# Patient Record
Sex: Male | Born: 1945 | Race: White | Hispanic: No | Marital: Married | Smoking: Former smoker
Health system: Southern US, Community
[De-identification: ages and names within clinical notes are randomized; demographics above are authoritative.]

## PROBLEM LIST (undated history)

## (undated) DIAGNOSIS — I619 Nontraumatic intracerebral hemorrhage, unspecified: Secondary | ICD-10-CM

## (undated) DIAGNOSIS — K922 Gastrointestinal hemorrhage, unspecified: Secondary | ICD-10-CM

## (undated) DIAGNOSIS — I493 Ventricular premature depolarization: Secondary | ICD-10-CM

## (undated) DIAGNOSIS — I5042 Chronic combined systolic (congestive) and diastolic (congestive) heart failure: Secondary | ICD-10-CM

## (undated) DIAGNOSIS — I639 Cerebral infarction, unspecified: Secondary | ICD-10-CM

## (undated) DIAGNOSIS — M069 Rheumatoid arthritis, unspecified: Secondary | ICD-10-CM

## (undated) DIAGNOSIS — I951 Orthostatic hypotension: Secondary | ICD-10-CM

## (undated) DIAGNOSIS — I671 Cerebral aneurysm, nonruptured: Secondary | ICD-10-CM

## (undated) DIAGNOSIS — I2510 Atherosclerotic heart disease of native coronary artery without angina pectoris: Secondary | ICD-10-CM

## (undated) DIAGNOSIS — R001 Bradycardia, unspecified: Secondary | ICD-10-CM

## (undated) DIAGNOSIS — K589 Irritable bowel syndrome without diarrhea: Secondary | ICD-10-CM

## (undated) DIAGNOSIS — I351 Nonrheumatic aortic (valve) insufficiency: Secondary | ICD-10-CM

## (undated) DIAGNOSIS — R0789 Other chest pain: Secondary | ICD-10-CM

## (undated) DIAGNOSIS — I77810 Thoracic aortic ectasia: Secondary | ICD-10-CM

## (undated) DIAGNOSIS — I441 Atrioventricular block, second degree: Secondary | ICD-10-CM

## (undated) DIAGNOSIS — I5032 Chronic diastolic (congestive) heart failure: Secondary | ICD-10-CM

## (undated) DIAGNOSIS — F0280 Dementia in other diseases classified elsewhere without behavioral disturbance: Secondary | ICD-10-CM

## (undated) DIAGNOSIS — I495 Sick sinus syndrome: Secondary | ICD-10-CM

## (undated) DIAGNOSIS — I440 Atrioventricular block, first degree: Secondary | ICD-10-CM

## (undated) DIAGNOSIS — K5090 Crohn's disease, unspecified, without complications: Secondary | ICD-10-CM

## (undated) DIAGNOSIS — I340 Nonrheumatic mitral (valve) insufficiency: Secondary | ICD-10-CM

## (undated) DIAGNOSIS — I4729 Other ventricular tachycardia: Secondary | ICD-10-CM

## (undated) DIAGNOSIS — K5010 Crohn's disease of large intestine without complications: Secondary | ICD-10-CM

## (undated) DIAGNOSIS — R569 Unspecified convulsions: Secondary | ICD-10-CM

## (undated) DIAGNOSIS — K5790 Diverticulosis of intestine, part unspecified, without perforation or abscess without bleeding: Secondary | ICD-10-CM

## (undated) DIAGNOSIS — I10 Essential (primary) hypertension: Secondary | ICD-10-CM

## (undated) DIAGNOSIS — R7303 Prediabetes: Secondary | ICD-10-CM

## (undated) DIAGNOSIS — G4730 Sleep apnea, unspecified: Secondary | ICD-10-CM

## (undated) DIAGNOSIS — Z950 Presence of cardiac pacemaker: Secondary | ICD-10-CM

## (undated) DIAGNOSIS — I119 Hypertensive heart disease without heart failure: Secondary | ICD-10-CM

## (undated) DIAGNOSIS — S065XAA Traumatic subdural hemorrhage with loss of consciousness status unknown, initial encounter: Secondary | ICD-10-CM

## (undated) DIAGNOSIS — I472 Ventricular tachycardia: Secondary | ICD-10-CM

## (undated) DIAGNOSIS — E785 Hyperlipidemia, unspecified: Secondary | ICD-10-CM

## (undated) HISTORY — DX: Hypertensive heart disease without heart failure: I11.9

## (undated) HISTORY — DX: Presence of cardiac pacemaker: Z95.0

## (undated) HISTORY — PX: NOSE SURGERY: SHX723

## (undated) HISTORY — DX: Prediabetes: R73.03

## (undated) HISTORY — DX: Sleep apnea, unspecified: G47.30

## (undated) HISTORY — DX: Dementia in other diseases classified elsewhere, unspecified severity, without behavioral disturbance, psychotic disturbance, mood disturbance, and anxiety: F02.80

## (undated) HISTORY — DX: Atrioventricular block, first degree: I44.0

## (undated) HISTORY — DX: Traumatic subdural hemorrhage with loss of consciousness status unknown, initial encounter: S06.5XAA

## (undated) HISTORY — DX: Orthostatic hypotension: I95.1

## (undated) HISTORY — DX: Other ventricular tachycardia: I47.29

## (undated) HISTORY — DX: Bradycardia, unspecified: R00.1

## (undated) HISTORY — DX: Atrioventricular block, second degree: I44.1

## (undated) HISTORY — DX: Rheumatoid arthritis, unspecified: M06.9

## (undated) HISTORY — DX: Ventricular premature depolarization: I49.3

## (undated) HISTORY — DX: Atherosclerotic heart disease of native coronary artery without angina pectoris: I25.10

## (undated) HISTORY — DX: Ventricular tachycardia: I47.2

## (undated) HISTORY — DX: Unspecified convulsions: R56.9

## (undated) HISTORY — PX: TONSILLECTOMY AND ADENOIDECTOMY: SUR1326

## (undated) HISTORY — DX: Diverticulosis of intestine, part unspecified, without perforation or abscess without bleeding: K57.90

## (undated) HISTORY — PX: HERNIA REPAIR: SHX51

## (undated) HISTORY — DX: Thoracic aortic ectasia: I77.810

## (undated) HISTORY — DX: Cerebral infarction, unspecified: I63.9

## (undated) HISTORY — DX: Crohn's disease, unspecified, without complications: K50.90

## (undated) HISTORY — DX: Nonrheumatic aortic (valve) insufficiency: I35.1

## (undated) HISTORY — DX: Hyperlipidemia, unspecified: E78.5

## (undated) HISTORY — DX: Sick sinus syndrome: I49.5

## (undated) HISTORY — DX: Crohn's disease of large intestine without complications: K50.10

## (undated) HISTORY — DX: Essential (primary) hypertension: I10

## (undated) HISTORY — DX: Other chest pain: R07.89

## (undated) HISTORY — DX: Chronic diastolic (congestive) heart failure: I50.32

## (undated) HISTORY — DX: Chronic combined systolic (congestive) and diastolic (congestive) heart failure: I50.42

## (undated) HISTORY — DX: Nontraumatic intracerebral hemorrhage, unspecified: I61.9

## (undated) HISTORY — DX: Cerebral aneurysm, nonruptured: I67.1

## (undated) HISTORY — DX: Gastrointestinal hemorrhage, unspecified: K92.2

## (undated) HISTORY — DX: Nonrheumatic mitral (valve) insufficiency: I34.0

## (undated) HISTORY — DX: Irritable bowel syndrome, unspecified: K58.9

---

## 1976-07-04 HISTORY — PX: COLONOSCOPY W/ POLYPECTOMY: SHX1380

## 1995-07-05 HISTORY — PX: SHOULDER SURGERY: SHX246

## 1998-03-26 ENCOUNTER — Encounter: Payer: Self-pay | Admitting: Internal Medicine

## 1999-07-05 DIAGNOSIS — H269 Unspecified cataract: Secondary | ICD-10-CM | POA: Insufficient documentation

## 1999-07-05 DIAGNOSIS — J309 Allergic rhinitis, unspecified: Secondary | ICD-10-CM | POA: Insufficient documentation

## 1999-07-13 ENCOUNTER — Encounter: Payer: Self-pay | Admitting: Internal Medicine

## 2000-03-23 ENCOUNTER — Encounter: Payer: Self-pay | Admitting: Internal Medicine

## 2000-04-08 ENCOUNTER — Encounter: Payer: Self-pay | Admitting: Internal Medicine

## 2001-08-13 ENCOUNTER — Encounter: Payer: Self-pay | Admitting: Internal Medicine

## 2001-10-11 ENCOUNTER — Encounter: Payer: Self-pay | Admitting: Internal Medicine

## 2002-03-17 ENCOUNTER — Encounter: Payer: Self-pay | Admitting: Internal Medicine

## 2002-10-31 ENCOUNTER — Encounter: Payer: Self-pay | Admitting: Internal Medicine

## 2003-05-01 ENCOUNTER — Encounter: Payer: Self-pay | Admitting: Internal Medicine

## 2003-10-30 ENCOUNTER — Encounter: Payer: Self-pay | Admitting: Internal Medicine

## 2004-06-22 ENCOUNTER — Ambulatory Visit: Payer: Self-pay | Admitting: Internal Medicine

## 2004-07-09 ENCOUNTER — Ambulatory Visit: Payer: Self-pay | Admitting: Internal Medicine

## 2004-07-12 ENCOUNTER — Encounter: Admission: RE | Admit: 2004-07-12 | Discharge: 2004-07-12 | Payer: Self-pay | Admitting: Internal Medicine

## 2008-02-25 ENCOUNTER — Ambulatory Visit: Payer: Self-pay | Admitting: Internal Medicine

## 2008-03-06 ENCOUNTER — Ambulatory Visit: Payer: Self-pay | Admitting: Internal Medicine

## 2008-03-06 ENCOUNTER — Encounter: Payer: Self-pay | Admitting: Internal Medicine

## 2008-03-08 ENCOUNTER — Encounter: Payer: Self-pay | Admitting: Internal Medicine

## 2008-04-17 ENCOUNTER — Ambulatory Visit: Payer: Self-pay | Admitting: Internal Medicine

## 2008-04-17 DIAGNOSIS — Z8719 Personal history of other diseases of the digestive system: Secondary | ICD-10-CM | POA: Insufficient documentation

## 2008-04-17 DIAGNOSIS — K5010 Crohn's disease of large intestine without complications: Secondary | ICD-10-CM | POA: Insufficient documentation

## 2008-04-18 ENCOUNTER — Encounter: Payer: Self-pay | Admitting: Internal Medicine

## 2008-11-26 ENCOUNTER — Encounter: Payer: Self-pay | Admitting: Internal Medicine

## 2009-02-03 ENCOUNTER — Ambulatory Visit: Payer: Self-pay | Admitting: Internal Medicine

## 2009-02-03 DIAGNOSIS — M1990 Unspecified osteoarthritis, unspecified site: Secondary | ICD-10-CM | POA: Insufficient documentation

## 2009-02-03 DIAGNOSIS — I440 Atrioventricular block, first degree: Secondary | ICD-10-CM | POA: Insufficient documentation

## 2009-02-03 DIAGNOSIS — I10 Essential (primary) hypertension: Secondary | ICD-10-CM | POA: Insufficient documentation

## 2009-02-03 DIAGNOSIS — E785 Hyperlipidemia, unspecified: Secondary | ICD-10-CM | POA: Insufficient documentation

## 2009-02-04 ENCOUNTER — Encounter: Payer: Self-pay | Admitting: Internal Medicine

## 2010-05-07 ENCOUNTER — Encounter: Payer: Self-pay | Admitting: Internal Medicine

## 2010-08-03 NOTE — Procedures (Signed)
Summary: Primary Care  Primary Care   Imported By: Awilda Bill Mescal 04/18/2008 09:17:27  _____________________________________________________________________  External Attachment:    Type:   Image     Comment:   External Document

## 2010-08-03 NOTE — Procedures (Signed)
Summary: Gastroenterology-Consult 12/13/07  Gastroenterology-Consult   Imported By: Awilda Bill CMA 04/18/2008 08:31:46  _____________________________________________________________________  External Attachment:    Type:   Image     Comment:   External Document

## 2010-08-03 NOTE — Procedures (Signed)
Summary: Gastroenterology-Consult  Gastroenterology-Consult   Imported By: Bernie 04/18/2008 08:55:06  _____________________________________________________________________  External Attachment:    Type:   Image     Comment:   External Document

## 2010-08-03 NOTE — Letter (Signed)
Summary: Patient Notice- Colon Biospy Results  [facility]  [address] [place], [place] [address]   Phone: [phone]  Fax: [phone]        March 08, 2008 MRN: [id number]    Jeff Weaver [address] [place], [place]  [address]    Dear Mr. [name],  I am pleased to inform you that the biopsies taken during your recent colonoscopy did not show any evidence of cancer upon pathologic examination.The biopsies show normal colon tissue, no evidence of Crohn's disease.  Additional information/recommendations:  _  x__Please call [phone] to schedule a return visit to review      your condition.  _x_Continue with the treatment plan as outlined on the day of your      exam.  x__You should have a repeat colonoscopy examination for this problem           in 5_ years.  Please call us if you are having persistent problems or have questions about your condition that have not been fully answered at this time.  Sincerely,  Lafayette Dragon MD  This letter has been electronically signed by your physician.

## 2010-08-03 NOTE — Procedures (Signed)
Summary: Gastroenterology-Consult 01/02/2008  Gastroenterology-Consult   Imported By: Awilda Bill CMA 04/18/2008 08:40:02  _____________________________________________________________________  External Attachment:    Type:   Image     Comment:   External Document

## 2010-08-03 NOTE — Procedures (Signed)
Summary: Gastroenterology-Consult 02/18/1999  Gastroenterology-Consult   Imported By: Awilda Bill CMA 04/18/2008 09:19:16  _____________________________________________________________________  External Attachment:    Type:   Image     Comment:   External Document

## 2010-08-03 NOTE — Procedures (Signed)
Summary: Gastroenterology-Surgical Consult  Gastroenterology-Surgical Consult   Imported By: Awilda Bill Talmage 04/18/2008 09:11:57  _____________________________________________________________________  External Attachment:    Type:   Image     Comment:   External Document

## 2010-08-03 NOTE — Procedures (Signed)
Summary: Gastroenterology-consult  Gastroenterology-consult   Imported By: Awilda Bill CMA 04/18/2008 08:53:34  _____________________________________________________________________  External Attachment:    Type:   Image     Comment:   External Document

## 2010-08-03 NOTE — Procedures (Signed)
Summary: Gastroenterology- GI Surgical Consult  Gastroenterology- GI Surgical Consult   Imported By: Awilda Bill Verdel 04/18/2008 08:29:20  _____________________________________________________________________  External Attachment:    Type:   Image     Comment:   External Document

## 2010-08-03 NOTE — Procedures (Signed)
Summary: Gastroenterology-Consult  Gastroenterology-Consult   Imported By: Awilda Bill CMA 04/18/2008 08:51:59  _____________________________________________________________________  External Attachment:    Type:   Image     Comment:   External Document

## 2010-08-03 NOTE — Miscellaneous (Signed)
Summary: [facility]/PV/prep  Clinical Lists Changes  Medications: Added new medication of MOVIPREP 100 GM  SOLR (PEG-KCL-NACL-NASULF-NA ASC-C) As per prep instructions. - Signed Rx of MOVIPREP 100 GM  SOLR (PEG-KCL-NACL-NASULF-NA ASC-C) As per prep instructions.;  #1 x 0;  Signed;  Entered by: Ulice Dash RN;  Authorized by: Lafayette Dragon MD;  Method used: Electronically to [facility] [place], [place], [place]  [address], Ph: [phone], Fax: [phone] Observations: Added new observation of NKA: T (02/25/2008 9:56)    Prescriptions: MOVIPREP 100 GM  SOLR (PEG-KCL-NACL-NASULF-NA ASC-C) As per prep instructions.  #1 x 0   Entered by:   Ulice Dash RN   Authorized by:   Lafayette Dragon MD   Signed by:   Ulice Dash RN on 02/25/2008   Method used:   Electronically to        [facility] (retail)       [place], [place]  [address]       Ph: [phone]       Fax: [phone]   RxID:   [id number]

## 2010-08-03 NOTE — Procedures (Signed)
Summary: Gastroenterology-Colon 01/16/2004  Gastroenterology-Colon   Imported By: Awilda Bill CMA 04/18/2008 08:41:07  _____________________________________________________________________  External Attachment:    Type:   Image     Comment:   External Document

## 2010-08-03 NOTE — Miscellaneous (Signed)
Summary: canasa supp  Clinical Lists Changes  Medications: Added new medication of CANASA 1000 MG  SUPP (MESALAMINE) 1 hs - Signed Rx of CANASA 1000 MG  SUPP (MESALAMINE) 1 hs;  #28 x 1;  Signed;  Entered by: Laverna Peace RN;  Authorized by: Lafayette Dragon MD;  Method used: Electronically to [facility], [place], [place]  [address], Ph: [phone], Fax: [phone]    Prescriptions: CANASA 1000 MG  SUPP (MESALAMINE) 1 hs  #28 x 1   Entered by:   Laverna Peace RN   Authorized by:   Lafayette Dragon MD   Signed by:   Laverna Peace RN on 03/06/2008   Method used:   Electronically to        [facility] (retail)       [place], [place]  [address]       Ph: [phone]       Fax: [phone]   RxID:   [id number]

## 2010-08-03 NOTE — Procedures (Signed)
Summary: Gastroenterology-Consult  Gastroenterology-Consult   Imported By: Awilda Bill CMA 04/18/2008 08:56:37  _____________________________________________________________________  External Attachment:    Type:   Image     Comment:   External Document

## 2010-08-03 NOTE — Assessment & Plan Note (Signed)
Summary: NEW PT/NS/KDC   Vital Signs:  Patient profile:   65 year old male Height:      69.75 inches Weight:      218.6 pounds BMI:     31.71 Temp:     98.5 degrees F oral Pulse rate:   60 / minute Resp:     16 per minute BP sitting:   106 / 62  (left arm) Cuff size:   large  Vitals Entered By: Georgette Dover (February 03, 2009 1:45 PM)  Primary Care Provider:  [facility]   History of Present Illness: Jeff Weaver is here to establish; he has been followed @ [facility]. He is asymptomatic. BP is 140/72-80 or <. Labs : LDL 79, HDL 39 ([facility]26[facility]  Hypertension History:      He denies headache, chest pain, palpitations, dyspnea with exertion, orthopnea, PND, peripheral edema, visual symptoms, neurologic problems, syncope, and side effects from treatment.  He notes no problems with any antihypertensive medication side effects.        Positive major cardiovascular risk factors include male age 12 years old or older, hyperlipidemia, hypertension, and current tobacco user.     Preventive Screening-Counseling & Management  Caffeine-Diet-Exercise     Does Patient Exercise: no  Allergies (verified): No Known Drug Allergies  Past History:  Past Medical History: Arthritis(DJD) Colon Polyps Crohns Disease with PMH of GI Bleed from Inflammatory Bowel Disease Hyperlipidemia Hypertension Irritable Bowel Syndrome  Past Surgical History: Hernia Surgery Rotator Cuff Repair RUE Sinus Surgery  Tonsillectomy Vasectomy  Family History: No FH of Colon Cancer: Family History of Diabetes: sister Family History of Liver Disease/Cirrhosis:Mother Father : COPD, CAD  Social History: Married Patient currently smokes. 6-10 bowls per day Retired Alcohol use-yes: occa Regular exercise-no Does Patient Exercise:  no  Review of Systems GU:  PSA was WNL @ [place]. MS:  Complains of joint pain and low back pain; denies joint redness, joint swelling, mid back pain, and thoracic pain; Clicking & occa  locking R shoulder with pain.  Physical Exam  General:  well-nourished,in no acute distress; alert,appropriate and cooperative throughout examination Head:  Normocephalic and atraumatic without obvious abnormalities. Neck:  No deformities, masses, or tenderness noted. Lungs:  Normal respiratory effort, chest expands symmetrically. Lungs are clear to auscultation, no crackles or wheezes. Heart:  Normal rate and regular rhythm. S1 and S2 normal without gallop, murmur, click, rub. S4 Abdomen:  Bowel sounds positive,abdomen soft and non-tender without masses, organomegaly or hernias noted. Pulses:  R and L carotid,radial,dorsalis pedis and posterior tibial pulses are full and equal bilaterally Extremities:  No clubbing, cyanosis, edema. Crepitus R knee > L Neurologic:  alert & oriented X3 and DTRs symmetrical and normal.   Skin:  Intact without suspicious lesions or rashes Cervical Nodes:  No lymphadenopathy noted Axillary Nodes:  No palpable lymphadenopathy Psych:  memory intact for recent and remote, normally interactive, and good eye contact.     Impression & Recommendations:  Problem # 1:  HYPERTENSION, ESSENTIAL NOS (ICD-401.9)  Controlled His updated medication list for this problem includes:    Lisinopril-hydrochlorothiazide 20-25 Mg Tabs (Lisinopril-hydrochlorothiazide) .Marland Kitchen... 1/2 tablet once daily  Orders: EKG w/ Interpretation (93000)  Problem # 2:  OTHER AND UNSPECIFIED HYPERLIPIDEMIA (ICD-272.4) Controlled His updated medication list for this problem includes:    Crestor 10 Mg Tabs (Rosuvastatin calcium) .Marland Kitchen... 1/2 tablet at bedtime  Problem # 3:  OSTEOARTHRITIS (ICD-715.90)  His updated medication list for this problem includes:  Aspirin 81 Mg Tabs (Aspirin) .Marland Kitchen... 1 tablet once daily    Ibuprofen 200 Mg Tabs (Ibuprofen) .Marland Kitchen... 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 Insert 1 suppository into rectum at bedtime 2)  Pentasa 250 Mg Cr-caps (Mesalamine) .... 2 by mouth two times a day 3)  Multivitamins Tabs (Multiple vitamin) .Marland Kitchen.. 1 tablet once daily 4)  Aspirin 81 Mg Tabs (Aspirin) .Marland Kitchen.. 1 tablet once daily 5)  Crestor 10 Mg Tabs (Rosuvastatin calcium) .... 1/2 tablet at bedtime 6)  Lisinopril-hydrochlorothiazide 20-25 Mg Tabs (Lisinopril-hydrochlorothiazide) .... 1/2 tablet once daily 7)  Flunisolide 0.025 % Soln (Flunisolide) .... As needed 8)  Ibuprofen 200 Mg Tabs (Ibuprofen) .... As needed  Hypertension Assessment/Plan:      The patient's hypertensive risk group is category B: At least one risk factor (excluding diabetes) with no target organ damage.  Today's blood pressure is 106/62.    Patient Instructions: 1)  Check your Blood Pressure regularly. Your goal = AVERAGE < 135/85.

## 2010-08-03 NOTE — Procedures (Signed)
Summary: Gastroenterology-Consult  Gastroenterology-Consult   Imported By: [place] 04/18/2008 09:21:53  _____________________________________________________________________  External Attachment:    Type:   Image     Comment:   External Document

## 2010-08-03 NOTE — Procedures (Signed)
Summary: Gastroenterology  Gastroenterology   Imported By: Awilda Bill CMA 04/18/2008 08:50:47  _____________________________________________________________________  External Attachment:    Type:   Image     Comment:   External Document

## 2010-08-03 NOTE — Procedures (Signed)
[facility]  [facility]   Imported By: Awilda Bill [place] 04/18/2008 08:25:25  _____________________________________________________________________  External Attachment:    Type:   Image     Comment:   External Document

## 2010-08-03 NOTE — Miscellaneous (Signed)
[facility]   Imported By: Edmonia James 05/20/2010 10:54:20  _____________________________________________________________________  External Attachment:    Type:   Image     Comment:   External Document

## 2010-08-03 NOTE — Assessment & Plan Note (Signed)
Summary: hx of crohns/mws   History of Present Illness Visit Type: self referral Primary GI MD: [name] MD Primary Provider: [facility] Chief Complaint: f/u colonoscopy 03-06-08 History of Present Illness:   This is a 65 year old white male with Crohn's disease since 1978 transferring care from the [facility] to [place]. A colonoscopy in 2009 showed mild diverticulosis of the left colon and bleeding from the anal canal without any definite hemorrhoids or fissures. He has been essentially asymptomatic on Canasa suppositories 1000 mg a day and Pentasa 500 mg twice a day. He has regular bowel habits and no abdominal pain. His disease has been rather mild, his last flareup occurred in June 2009 and responded to a prednisone taper. The flareup prior to that was about 5 years before that and again responded to steroids. We have received his [facility] records and I have reviewed them. The patient has had excellent medical care.   GI Review of Systems      Denies abdominal pain, acid reflux, belching, bloating, chest pain, dysphagia with liquids, dysphagia with solids, heartburn, loss of appetite, nausea, vomiting, vomiting blood, weight loss, and  weight gain.        Denies anal fissure, black tarry stools, change in bowel habit, constipation, diarrhea, diverticulosis, fecal incontinence, heme positive stool, hemorrhoids, irritable bowel syndrome, jaundice, light color stool, liver problems, rectal bleeding, and  rectal pain.     Prior Medications Reviewed Using: Patient Recall  Updated Prior Medication List: CANASA 1000 MG  SUPP (MESALAMINE) 1 hs PENTASA 250 MG CR-CAPS (MESALAMINE) 2 by mouth two times a day MULTIVITAMINS   TABS (MULTIPLE VITAMIN) 1 tablet once daily ASPIRIN 81 MG  TABS (ASPIRIN) 1 tablet once daily CRESTOR 10 MG TABS (ROSUVASTATIN CALCIUM) 1/2 tablet at bedtime LISINOPRIL-HYDROCHLOROTHIAZIDE 20-25 MG TABS (LISINOPRIL-HYDROCHLOROTHIAZIDE) 1/2 tablet once daily  Current Allergies  (reviewed today): No known allergies   Past Medical History:    Reviewed history and no changes required:       Arthritis       Colon Polyps       Crohns Disease       GI Bleed       Hyperlipidemia       Hypertension       Irritable Bowel Syndrome  Past Surgical History:    Hernia Surgery    Rotator Cuff Repair    Sinus Surgery   Family History:    Reviewed history and no changes required:       No FH of Colon Cancer:       Family History of Diabetes: sister       Family History of Liver Disease/Cirrhosis:Mother, Father  Social History:    Reviewed history and no changes required:       Married       Patient currently smokes. 6-10 pipes per day       Illicit Drug Use - no   Risk Factors:  Tobacco use:  current Drug use:  no   Review of Systems       The patient complains of arthritis/joint pain, back pain, fatigue, muscle pains/cramps, and sleeping problems.     Vital Signs:  Patient Profile:   65 Years Old Male Height:     70 inches Weight:      219.25 pounds BMI:     31.57 BSA:     2.17 Pulse rate:   78 / minute Pulse rhythm:   regular BP sitting:   104 / 64  (  right arm)  Vitals Entered By: Bethel Born CMA (April 17, 2008 10:10 AM)                  Physical Exam  General:     Well developed, well nourished, no acute distress. Neck:     Supple; no masses or thyromegaly. Lungs:     Clear throughout to auscultation. Heart:     Regular rate and rhythm; no murmurs, rubs,  or bruits. Abdomen:     soft abdomen, nontender, normoactive bowel sounds, liver edge at costal margin, Rectal:     normal rectal tone no evidence of perianal disease, no tenderness, stool is Hemoccult negative Extremities:     No clubbing, cyanosis, edema or deformities noted.    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 Currently in remission.  Status post recent colonoscopy in September 2009 with no evidence of microscopic or endoscopically  detectable l disease. The patient is on Pentasa and Canasa suppositories. I have asked him to taper his Canasa suppositories to a maintenance dose of 2-3 times a week. He needs to continue on his Pentasa 500 mg twice a day. We will see him again n one year.   Patient Instructions: 1)  Pentasa 250 mg 2 p.o. b.i.d. 2)  Multiple vitamin one p.o. q.d. 3)  Canasa suppositories 1000 mg 3 times a week 4)  Office visit in one year 5)   pt is going to establish with a PCP in our office    Prescriptions: CANASA 1000 MG  SUPP (MESALAMINE) Insert 1 suppository into rectum at bedtime  #90 x 3   Entered by:   Awilda Bill CMA   Authorized by:   Lafayette Dragon MD   Signed by:   Awilda Bill CMA on 04/17/2008   Method used:   Print then Give to Patient   RxID:   [id number]  ]

## 2010-08-03 NOTE — Procedures (Signed)
Summary: Gastroenterology-surgical report 05/01/2000  Gastroenterology-surgical report   Imported By: Awilda Bill CMA 04/18/2008 08:43:15  _____________________________________________________________________  External Attachment:    Type:   Image     Comment:   External Document

## 2010-08-03 NOTE — Procedures (Signed)
Summary: Gastroenterology-Consult  Gastroenterology-Consult   Imported By: Awilda Bill CMA 04/18/2008 09:07:19  _____________________________________________________________________  External Attachment:    Type:   Image     Comment:   External Document

## 2010-08-03 NOTE — Procedures (Signed)
Summary: Colonoscopy   Colonoscopy  Procedure date:  03/06/2008  Findings:      Location:  [place].    Procedures Next Due Date:    Colonoscopy: 03/2013  Patient Name: Jeff Weaver, Jeff Weaver MRN:  Procedure Procedures: Colonoscopy CPT: 65681.    with biopsy. CPT: X8550940.  Personnel: Endoscopist: Dora L. Olevia Perches, MD.  Exam Location: Exam performed in Outpatient Clinic. Outpatient  Patient Consent: Procedure, Alternatives, Risks and Benefits discussed, consent obtained, from patient. Consent was obtained by the RN.  Indications Symptoms: Hematochezia.  Surveillance of: Crohn's Disease. 2005.  Comments: no records available, pt is a direct colon, he has been followed at [facility] for Crohn's disease  ( 1973 Dx) History  Current Medications: Patient is not currently taking Coumadin.  Pre-Exam Physical: Performed Mar 06, 2008. Entire physical exam was normal.  Comments: Pt. history reviewed/updated, physical exam performed prior to initiation of sedation?yes Exam Exam: Extent of exam reached: Cecum, extent intended: Cecum.  The cecum was identified by appendiceal orifice and IC valve. Duration of exam: time in 4:34 min, out 6:58 min minutes. Colon retroflexion performed. Images taken. ASA Classification: II. Tolerance: good.  Monitoring: Pulse and BP monitoring, Oximetry used. Supplemental O2 given.  Colon Prep Used Moviprep for colon prep. Prep results: good.  Sedation Meds: Patient assessed and found to be appropriate for moderate (conscious) sedation. Fentanyl 75 mcg. given IV. Versed 7 mg. given IV.  Findings - DIAGNOSTIC TEST: Biopsies taken. from Ascending Colon to Descending Colon. Reason: r/o granulomas, .  - NORMAL EXAM: Cecum.  - DIVERTICULOSIS: Sigmoid Colon. ICD9: Diverticulosis: 562.10. Comments: minimal sigmoid diverticulosis.  - NORMAL EXAM: Rectum. Comments: no internal hems, anal canal with bleeding around the canal, no fissure,no  external disease.   Assessment Abnormal examination, see findings above.  Diagnoses: 562.10: Diverticulosis.   Comments: no evidence of active Crohn's disease, small amount of bleeding from the anal canal, no definite rectal involvement  Events  Unplanned Interventions: No intervention was required.  Unplanned Events: There were no complications. Plans Medication Plan: Await pathology. Canasa supp 102m: 1 HS, starting Mar 06, 2008   Patient Education: Patient given standard instructions for: Patient instructed to get routine colonoscopy every 5 years.  Comments: stop Cort-enemas, reduce Mesalamine to 250 mg, 2 po bid, [facility]  Disposition: After procedure patient sent to recovery. After recovery patient sent home.  Scheduling/Referral: [place], [place] [facility], first available,    cc:  BEdwin Dada MD   REPORT OF SURGICAL PATHOLOGY   Case #: [id number] Name: [name], KEANOffice Chart Number:  N/A   MRN: [id number]: JLennox Solders KLyndon Code MD DOB/Age  [birth year](Age: [age]    Gender: M Date Taken:  03/06/2008 Date Received: 03/06/2008   FINAL DIAGNOSIS   ***MICROSCOPIC EXAMINATION AND DIAGNOSIS***   1.  COLON, RANDOM RIGHT, BIOPSIES:   - BENIGN COLONIC MUCOSA.  NO SIGNIFICANT INFLAMMATION OR OTHER ABNORMALITIES  IDENTIFIED.    2.  COLON, SIGMOID, BIOPSIES:  - BENIGN COLONIC MUCOSA.  NO SIGNIFICANT INFLAMMATION OR OTHER ABNORMALITIES  IDENTIFIED.    mj Date Reported:  03/07/2008     JLennox Solders KLyndon Code MD *** Electronically Signed Out By JBK ***   Clinical information 1,2.  R/O Crohn' s (jes)    specimen(s) obtained 1: Colon, biopsy, random right 2: Colon, biopsy, sigmoid   Gross Description 1.  Received in formalin are  tan, soft tissue fragments that are submitted in toto.  Number:  six  Size: 0.1 to 0.7 cm  One block   2.  Received in formalin are tan, soft tissue fragments that are submitted in toto.    Number:  four. Size:  0.2 to 0.8 cm One block    (TA:jes,03/07/08)    jes/     Signed by Lafayette Dragon MD on 03/08/2008 at 10:41 PM  ________________________________________________________________________ recall colon 5 years   Signed by Lafayette Dragon MD on 03/08/2008 at 10:41 PM  ________________________________________________________________________    March 08, 2008 MRN: [id number]    [name] [address], [place]  [address]    Dear Jeff Weaver,  I am pleased to inform you that the biopsies taken during your recent colonoscopy did not show any evidence of cancer upon pathologic examination.The biopsies show normal colon tissue, no evidence of Crohn's disease.  Additional information/recommendations:  _  x__Please call [phone] to schedule a return visit to review      your condition.  _x_Continue with the treatment plan as outlined on the day of your      exam.  x__You should have a repeat colonoscopy examination for this problem           in 5_ years.  Please call us if you are having persistent problems or have questions about your condition that have not been fully answered at this time.  Sincerely,  Lafayette Dragon MD  This letter has been electronically signed by your physician.   Signed by Lafayette Dragon MD on 03/08/2008 at 10:43 PM  ________________________________________________________________________ This report was created from the original endoscopy report, which was reviewed and signed by the above listed endoscopist.

## 2010-08-03 NOTE — Procedures (Signed)
Summary: Gastroenterology-proceedure report  Gastroenterology-proceedure report   Imported By: Awilda Bill CMA 04/18/2008 09:02:50  _____________________________________________________________________  External Attachment:    Type:   Image     Comment:   External Document

## 2010-09-28 ENCOUNTER — Ambulatory Visit: Payer: Medicare Other | Attending: Orthopedic Surgery | Admitting: Physical Therapy

## 2010-09-28 DIAGNOSIS — IMO0001 Reserved for inherently not codable concepts without codable children: Secondary | ICD-10-CM | POA: Insufficient documentation

## 2010-09-28 DIAGNOSIS — M25579 Pain in unspecified ankle and joints of unspecified foot: Secondary | ICD-10-CM | POA: Insufficient documentation

## 2010-10-01 ENCOUNTER — Ambulatory Visit: Payer: Medicare Other | Admitting: Physical Therapy

## 2010-10-04 ENCOUNTER — Ambulatory Visit: Payer: Medicare Other | Attending: Orthopedic Surgery | Admitting: Physical Therapy

## 2010-10-04 DIAGNOSIS — M25579 Pain in unspecified ankle and joints of unspecified foot: Secondary | ICD-10-CM | POA: Insufficient documentation

## 2010-10-04 DIAGNOSIS — IMO0001 Reserved for inherently not codable concepts without codable children: Secondary | ICD-10-CM | POA: Insufficient documentation

## 2010-10-06 ENCOUNTER — Ambulatory Visit: Payer: Medicare Other | Admitting: Physical Therapy

## 2010-10-11 ENCOUNTER — Ambulatory Visit: Payer: Medicare Other | Admitting: Rehabilitation

## 2010-10-15 ENCOUNTER — Ambulatory Visit: Payer: Medicare Other | Admitting: Physical Therapy

## 2010-10-18 ENCOUNTER — Ambulatory Visit: Payer: Medicare Other | Admitting: Rehabilitation

## 2010-10-22 ENCOUNTER — Ambulatory Visit: Payer: Medicare Other | Admitting: Physical Therapy

## 2010-10-25 ENCOUNTER — Ambulatory Visit: Payer: Medicare Other | Admitting: Rehabilitation

## 2010-10-29 ENCOUNTER — Ambulatory Visit: Payer: Medicare Other | Admitting: Physical Therapy

## 2010-11-01 ENCOUNTER — Ambulatory Visit: Payer: Medicare Other | Admitting: Physical Therapy

## 2010-11-03 ENCOUNTER — Ambulatory Visit: Payer: Medicare Other | Attending: Orthopedic Surgery | Admitting: Physical Therapy

## 2010-11-03 DIAGNOSIS — IMO0001 Reserved for inherently not codable concepts without codable children: Secondary | ICD-10-CM | POA: Insufficient documentation

## 2010-11-03 DIAGNOSIS — M25579 Pain in unspecified ankle and joints of unspecified foot: Secondary | ICD-10-CM | POA: Insufficient documentation

## 2010-11-09 ENCOUNTER — Ambulatory Visit: Payer: Medicare Other | Admitting: Physical Therapy

## 2010-11-11 ENCOUNTER — Ambulatory Visit: Payer: Medicare Other | Admitting: Physical Therapy

## 2010-11-16 ENCOUNTER — Ambulatory Visit: Payer: Medicare Other | Admitting: Physical Therapy

## 2010-11-18 ENCOUNTER — Ambulatory Visit: Payer: Medicare Other | Admitting: Physical Therapy

## 2010-11-19 ENCOUNTER — Encounter: Payer: Medicare Other | Admitting: Physical Therapy

## 2010-11-23 ENCOUNTER — Ambulatory Visit: Payer: Medicare Other | Admitting: Physical Therapy

## 2010-11-24 ENCOUNTER — Encounter: Payer: Medicare Other | Admitting: Physical Therapy

## 2010-11-30 ENCOUNTER — Encounter: Payer: Medicare Other | Admitting: Physical Therapy

## 2010-12-03 ENCOUNTER — Encounter: Payer: Medicare Other | Admitting: Physical Therapy

## 2011-07-10 DIAGNOSIS — J019 Acute sinusitis, unspecified: Secondary | ICD-10-CM | POA: Diagnosis not present

## 2011-07-25 ENCOUNTER — Encounter: Payer: Self-pay | Admitting: Internal Medicine

## 2011-07-25 ENCOUNTER — Ambulatory Visit (INDEPENDENT_AMBULATORY_CARE_PROVIDER_SITE_OTHER): Payer: Medicare Other | Admitting: Internal Medicine

## 2011-07-25 VITALS — BP 126/88 | HR 56 | Temp 98.7°F | Resp 12 | Ht 70.0 in | Wt 233.2 lb

## 2011-07-25 DIAGNOSIS — Z01818 Encounter for other preprocedural examination: Secondary | ICD-10-CM

## 2011-07-25 DIAGNOSIS — Z23 Encounter for immunization: Secondary | ICD-10-CM | POA: Diagnosis not present

## 2011-07-25 NOTE — Progress Notes (Signed)
Subjective:    Patient ID: Jeff Weaver, male    DOB: 1945-10-17, 66 y.o.   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 Lipids will be repeated at the end of this month at the [place]. Labs included an A1c of 5.7, creatinine 0.9 and potassium 3.9. Thyroid function tests were also normal.  HYPERTENSION: Disease Monitoring  Blood pressure range: 127-132/70-88  Chest pain: no exertional cp   Dyspnea: no   Claudication: no  Medication compliance: yes,   Medication Side Effects  Lightheadedness: no   Urinary frequency: no , but nocturia up to  3-4   Edema: no   Preventitive Healthcare:  Exercise: no   Diet Pattern: no plan  Salt Restriction: yes   He has a history of regional enteritis. He denies abdominal pain, rectal bleeding, melena,or unexplained  weight loss.       Objective:   Physical Exam Gen.: Healthy and well-nourished in appearance. Alert, appropriate and cooperative throughout exam. Head: Normocephalic without obvious abnormalities;  Neck: No deformities, masses, or tenderness noted.  Thyroid normal. Lungs: Normal respiratory effort; chest expands symmetrically. Lungs are clear to auscultation without rales, wheezes, or increased work of breathing. Heart: Normal rate and rhythm. Normal S1 and S2. No gallop, click, or rub. S4 w/o murmur. Abdomen: Bowel sounds normal; abdomen soft and nontender. No masses, organomegaly or hernias  noted. Genitalia: DRE 12/12 @ [facility] Musculoskeletal/extremities: No deformity or scoliosis noted of  the thoracic or lumbar spine. No clubbing, cyanosis, edema, or deformity noted. Range of motion  normal .Tone & strength  normal.Joints normal. Nail health  good. Vascular: Carotid, radial artery, dorsalis pedis and  posterior tibial pulses are full and equal. No bruits present. Neurologic: Alert and oriented x3. Deep tendon reflexes symmetrical and normal.          Skin: Intact without suspicious lesions or rashes. Lymph: No cervical, axillary  lymphadenopathy present. Psych: Mood and affect are normal. 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 I would recommend a CK with those labs because of the history of leg pain on Crestor.  #3 hypertension, control appears to be adequate.  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 He is on Tenoretic at 50/25 daily. I asked  him to discuss other options to the Tenoretic because of the marked bradycardia(pulse 46).  The computer describes low voltage in the precordial leads; this is  due to body habitus. The computer and was and will also describes incomplete left bundle branch block. There appears to be a minor interventricular delay .

## 2011-07-25 NOTE — Patient Instructions (Addendum)
.  Share results with all medical care seen. Blood Pressure Goal  Ideally is an AVERAGE < 135/85. This AVERAGE should be calculated from @ least 5-7 BP readings taken @ different times of day on different days of week. You should not respond to isolated BP readings , but rather the AVERAGE for that week Risk of premature heart attack or stroke increases as LDL or BAD cholesterol rises.Advanced cholesterol panels optimally determine risk based on particle composition ( NMR Lipoprofile ) or by assessing multiple other genetic risks([facility] Heart Panel or [facility] Lipid Panel). These are indicated when LDL is > 130, especially if there is family history of heart attack in males before 5 or women before 54.  The best dietary  information on cholesterol is Dr Nunzio Cory book Eat, Drink & Be Healthy. Please provide results of your cholesterol panel when available. The results of your sleep study are critical prior to your being cleared medically for surgery.  08/09/2011:Sleep apnea studies done 03/21/11 at the [facility] were reviewed. Severe sleep apnea is present with potential cardiovascular risk and risk of daytime sleepiness. CPAP was to be titrated. A definitive statement is needed from the specialist treating his sleep apnea prior to clearance for the orthopedic surgery because of potential associated perioperative risks.

## 2011-08-09 ENCOUNTER — Encounter: Payer: Self-pay | Admitting: Internal Medicine

## 2011-08-09 DIAGNOSIS — G4733 Obstructive sleep apnea (adult) (pediatric): Secondary | ICD-10-CM | POA: Insufficient documentation

## 2011-08-09 NOTE — Progress Notes (Signed)
  Subjective:    Patient ID: Jeff Weaver, male    DOB: 1945-11-04, 67 y.o.   MRN: [id number]  HPI    Review of Systems     Objective:   Physical Exam        Assessment & Plan:  Results sleep study done 03/21/11 [facility] were reviewed. Severe obstructive sleep apnea was present; abdomen was worse when supine. Significant periodic leg movements of sleep disrupted sleep pattern. CPAP was to be titrated. This degree of sleep apnea. 2 attention attributes to daytime sleepiness and long-term cardiovascular morbidity. The  response to CPAP or other treatment in controlling the severe Sleep Apnea must be clarified prior to clearance for surgery by Dr. Veverly Fells.

## 2011-11-09 ENCOUNTER — Encounter: Payer: Self-pay | Admitting: Internal Medicine

## 2011-11-09 ENCOUNTER — Ambulatory Visit (INDEPENDENT_AMBULATORY_CARE_PROVIDER_SITE_OTHER): Payer: Medicare Other | Admitting: Internal Medicine

## 2011-11-09 VITALS — BP 120/78 | HR 54 | Temp 98.1°F | Wt 220.0 lb

## 2011-11-09 DIAGNOSIS — J019 Acute sinusitis, unspecified: Secondary | ICD-10-CM

## 2011-11-09 DIAGNOSIS — J209 Acute bronchitis, unspecified: Secondary | ICD-10-CM

## 2011-11-09 MED ORDER — AMOXICILLIN 500 MG PO CAPS: 500.0000 mg | ORAL_CAPSULE | Freq: Three times a day (TID) | ORAL | Status: AC

## 2011-11-09 MED ORDER — HYDROCODONE-HOMATROPINE 5-1.5 MG/5ML PO SYRP: 5.0000 mL | ORAL_SOLUTION | Freq: Four times a day (QID) | ORAL | Status: AC | PRN

## 2011-11-09 NOTE — Patient Instructions (Signed)
Plain Mucinex for thick secretions ;force NON dairy fluids . Use a Neti pot daily as needed for sinus congestion; going from open side to congested side . Nasal cleansing in the shower as discussed. Make sure that all residual soap is removed to prevent irritation. Nasonex 1 spray in each nostril twice a day as needed. Use the "crossover" technique as discussed.

## 2011-11-09 NOTE — Progress Notes (Signed)
  Subjective:    Patient ID: Jeff Weaver, male    DOB: Oct 15, 1945, 66 y.o.   MRN: [id number]  HPI Symptoms began approximately 10 days ago with head congestion followed by productive cough; over-the-counter allergy congestion decongestants and cough drops have not been of benefit. He did have sore throat approximately a ago  He has not had frontal headache, facial pain, earache or discharge, or dental pain. He does have purulent nasal secretions as well as a lesser amount of purulent sputum. There has been some shortness of breath but no frank wheezing. He has noted increased sweating with exertion  Significantly he has obstructive sleep apnea. He quit smoking in January 2011    Review of Systems he has noted some hesitancy in urination since he has been on the  decongestant     Objective:   Physical Exam General appearance:good health ;well nourished; no acute distress or increased work of breathing is present.  No  lymphadenopathy about the head, neck, or axilla noted.   Eyes: No conjunctival inflammation or lid edema is present.   Ears:  External ear exam shows no significant lesions or deformities.  Otoscopic examination reveals clear canals, tympanic membranes are intact bilaterally without bulging, retraction, inflammation or discharge.  Nose:  External nasal examination shows no deformity or inflammation. Nasal mucosa are pink and moist without lesions or exudates. No septal dislocation or deviation.No obstruction to airflow. Hyponasal speech pattern  Oral exam: Dental hygiene is good; lips and gums are healthy appearing.There is no oropharyngeal erythema or exudate noted.     Heart:  Normal rate and regular rhythm. S1 and S2 normal without gallop, murmur, click, rub . S 4  Lungs:Chest clear to auscultation; no wheezes, rhonchi,rales ,or rubs present.No increased work of breathing.    Extremities:  No cyanosis, edema, or clubbing  noted    Skin: Warm & dry             Assessment & Plan:  #1 sinusitis  #2 bronchitis with bronchospasm  Plan: See orders and recommendations

## 2011-11-23 ENCOUNTER — Encounter: Payer: Self-pay | Admitting: Internal Medicine

## 2011-11-23 ENCOUNTER — Ambulatory Visit (INDEPENDENT_AMBULATORY_CARE_PROVIDER_SITE_OTHER): Payer: Medicare Other | Admitting: Internal Medicine

## 2011-11-23 ENCOUNTER — Ambulatory Visit: Payer: Medicare Other | Admitting: Internal Medicine

## 2011-11-23 DIAGNOSIS — I671 Cerebral aneurysm, nonruptured: Secondary | ICD-10-CM

## 2011-11-23 DIAGNOSIS — R7309 Other abnormal glucose: Secondary | ICD-10-CM | POA: Diagnosis not present

## 2011-11-23 DIAGNOSIS — R413 Other amnesia: Secondary | ICD-10-CM | POA: Diagnosis not present

## 2011-11-23 DIAGNOSIS — E785 Hyperlipidemia, unspecified: Secondary | ICD-10-CM | POA: Diagnosis not present

## 2011-11-23 DIAGNOSIS — I10 Essential (primary) hypertension: Secondary | ICD-10-CM | POA: Diagnosis not present

## 2011-11-23 LAB — BASIC METABOLIC PANEL
BUN: 18 mg/dL (ref 6–23)
CO2: 33 mEq/L — ABNORMAL HIGH (ref 19–32)
Calcium: 9.5 mg/dL (ref 8.4–10.5)
Chloride: 99 mEq/L (ref 96–112)
Creatinine, Ser: 0.9 mg/dL (ref 0.4–1.5)
GFR: 95.41 mL/min (ref >60.00)
Glucose, Bld: 109 mg/dL — ABNORMAL HIGH (ref 70–99)
Potassium: 3.4 mEq/L — ABNORMAL LOW (ref 3.5–5.1)
Sodium: 142 mEq/L (ref 135–145)

## 2011-11-23 LAB — HEPATIC FUNCTION PANEL
ALT: 55 U/L — ABNORMAL HIGH (ref 0–53)
AST: 44 U/L — ABNORMAL HIGH (ref 0–37)
Albumin: 4 g/dL (ref 3.5–5.2)
Alkaline Phosphatase: 43 U/L (ref 39–117)
Bilirubin, Direct: 0.2 mg/dL (ref 0.0–0.3)
Total Bilirubin: 0.9 mg/dL (ref 0.3–1.2)
Total Protein: 6.9 g/dL (ref 6.0–8.3)

## 2011-11-23 LAB — LIPID PANEL
Cholesterol: 115 mg/dL (ref 0–200)
HDL: 43.1 mg/dL (ref >39.00)
LDL Cholesterol: 58 mg/dL (ref 0–99)
Total CHOL/HDL Ratio: 3
Triglycerides: 69 mg/dL (ref 0.0–149.0)
VLDL: 13.8 mg/dL (ref 0.0–40.0)

## 2011-11-23 LAB — HEMOGLOBIN A1C: Hgb A1c MFr Bld: 6 % (ref 4.6–6.5)

## 2011-11-23 LAB — TSH: TSH: 0.78 u[IU]/mL (ref 0.35–5.50)

## 2011-11-23 LAB — CK: Total CK: 420 U/L — ABNORMAL HIGH (ref 7–232)

## 2011-11-23 NOTE — Progress Notes (Signed)
Subjective:    Patient ID: Jeff Weaver, male    DOB: 11-Aug-1945, 66 y.o.   MRN: [id number]  HPI Because of possible short-term memory loss and situational depression related to family health issues and stresses; CNS imaging was performed at [facility]. 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  The aneurysm is asymptomatic. Specifically denies headaches, numbness or tingling, limb weakness, or incontinence of stool or urine   Mini-Mental Status exam was completed; is unaware of the results  One sister had dementia in her 46s.    Review of Systems HYPERTENSION: Disease Monitoring: Blood pressure average-135/83 Chest pain, palpitations- no       Dyspnea- no Medications: Compliance- yes  Lightheadedness,Syncope- no    Edema- no  FASTING HYPERGLYCEMIA, PMH of: A1c 6% @ [facility] Disease Monitoring: Blood Sugar ranges-no  Polyuria/phagia/dipsia- no       Visual problems- no Medications: Compliance- heart healthy diet with 10 pound weight loss Exercise: He is walking 100 miles since March  1  HYPERLIPIDEMIA: Disease Monitoring: See symptoms for Hypertension Medications: Compliance- Crestor was stopped over a year ago due to pains in his legs; this was mainly pain in the area of the knees. In the 1980s he was diagnosed as having rheumatoid arthritis while he was serving in [employer]. He's been on atorvastatin for at least 3 months. Abd pain, bowel changes- no   Muscle aches- no           Objective:   Physical Exam Gen.:  well-nourished in appearance. Alert, appropriate and cooperative throughout exam. Head: Normocephalic without obvious abnormalities  Eyes: No corneal or conjunctival inflammation noted.  Extraocular motion intact. Field of Vision grossly normal. Neck: No deformities, masses, or tenderness noted. Range of motion & Thyroid normal. Lungs: Normal respiratory effort; chest expands symmetrically. Lungs are clear to auscultation  without rales, wheezes, or increased work of breathing. Heart: Slow rate and regular rhythm. Normal S1 and S2. No gallop, click, or rub. No  murmur. Abdomen: Bowel sounds normal; abdomen soft and nontender. No masses, organomegaly.Mild ventral  or hernias noted.                                                                                Musculoskeletal/extremities: Mild lordosis  noted of  the thoracic  spine. No clubbing, cyanosis, edema, or deformity noted. Range of motion  normal .Tone & strength  normal.Joints normal. Nail health  good. Crepitus of the knees without effusion Vascular: Carotid, radial artery, dorsalis pedis and  posterior tibial pulses are full and equal. No bruits present. Neurologic: Alert and oriented x3. Deep tendon reflexes symmetrical and normal.          Skin: Intact without suspicious lesions or rashes. Lymph: No cervical, axillary lymphadenopathy present. Psych: Mood and affect are normal. Normally interactive                     Mini-Mental Status exam was completed to evaluate the patient's concerns in reference to memory deficit. Results revealed:  Assessment & Plan:   #1 small CNS aneurysm, asymptomatic. Incidental finding during evaluation of possible memory deficit and possible depression, stress related.  It is critical that we receive the assessment from [facility] from the neurosurgeon who was to review the CNS imaging. If this is not forthcoming, neurosurgical evaluation in the [place] area will be pursued.He prefers [facility]  #2 hypertension, well controlled. The most important factor will be to maintain excellent blood pressure control because of the aneurysm.  #3 dyslipidemia; subjective arthralgias while on Crestor. He is asymptomatic on atorvastatin.  #4 subjective memory deficit;not documented .Please see Mini-Mental Status exam results; 30 out of 30.  Plan: See orders and  recommendations.

## 2011-11-23 NOTE — Patient Instructions (Addendum)
Please try to go on My Chart within the next 24 hours to allow me to release the results directly to you. Please obtain NS review of cns imaging  Results as discussed

## 2011-11-24 ENCOUNTER — Encounter: Payer: Self-pay | Admitting: Internal Medicine

## 2011-11-25 ENCOUNTER — Telehealth: Payer: Self-pay | Admitting: Internal Medicine

## 2011-11-25 MED ORDER — PRAVASTATIN SODIUM 40 MG PO TABS: 40.0000 mg | ORAL_TABLET | Freq: Every day | ORAL | Status: DC

## 2011-11-25 NOTE — Telephone Encounter (Signed)
Dr.Hopper please advise if patient needs to f/u as recommended

## 2011-11-25 NOTE — Telephone Encounter (Signed)
Please consider pravastatin 40 mg at bedtime with repeat fasting labs in 10 weeks (lipids, hepatic panel, CK; 272.4, 995.20). This medicine would cost $10 for 90 pills at [facility] or  [facility], if insurance not used.  This agent is a hydrophilic agent (dissolves in water) and is weaker than Lipitor. Lipitor is lipophilic(dissolves in oil) and is more likely to have a negative impact on muscle. CK, muscle enzyme, was elevated on Lipitor

## 2011-11-25 NOTE — Telephone Encounter (Signed)
Spoke with patient, patient ok'd all information

## 2011-11-25 NOTE — Telephone Encounter (Signed)
Pt states he was here for an OV on 11/23/11 and Dr. Linna Darner has requested that he come in before refilling his cholesterol med. Pt states he needs a refill within the next week and needs clarification on whether or not he needs to come back in. Pt states he does not think he needs to come back in if he was here a couple days ago. Please Advise.

## 2011-12-12 ENCOUNTER — Telehealth: Payer: Self-pay | Admitting: *Deleted

## 2011-12-12 NOTE — Telephone Encounter (Signed)
Discuss with patient who states that he has been in touch with them and they advised him that he is to received copy of the reports. Pt indicated that he will provide a copy to [place] once he has it.

## 2011-12-12 NOTE — Telephone Encounter (Signed)
Message copied by Marylen Ponto on Mon Dec 12, 2011  9:20 AM ------      Message from: Hendricks Limes      Created: Fri Dec 09, 2011  5:16 PM       Please let him know I have still not received a report from the neurosurgeons at [facility]. Please ask him  to followup on that.

## 2011-12-22 ENCOUNTER — Other Ambulatory Visit (INDEPENDENT_AMBULATORY_CARE_PROVIDER_SITE_OTHER): Payer: Medicare Other

## 2011-12-22 DIAGNOSIS — E876 Hypokalemia: Secondary | ICD-10-CM | POA: Diagnosis not present

## 2011-12-22 LAB — POTASSIUM: Potassium: 3.6 mEq/L (ref 3.5–5.1)

## 2011-12-22 NOTE — Progress Notes (Signed)
Labs Only

## 2012-01-02 ENCOUNTER — Telehealth: Payer: Self-pay | Admitting: Internal Medicine

## 2012-01-02 ENCOUNTER — Other Ambulatory Visit: Payer: Self-pay | Admitting: Internal Medicine

## 2012-01-02 ENCOUNTER — Encounter: Payer: Self-pay | Admitting: Internal Medicine

## 2012-01-02 DIAGNOSIS — I671 Cerebral aneurysm, nonruptured: Secondary | ICD-10-CM

## 2012-01-02 NOTE — Telephone Encounter (Signed)
Patient walk in wanting to speak to Dr. Linna Darner nurse about paper that he got from the [place] about his aneurysm and like for Dr. Linna Darner to look through the paper and tell him what would be the next step if he has to see another Doctor that he would like to go to [facility].

## 2012-01-02 NOTE — Telephone Encounter (Signed)
Dr.Hopper please review documents placed on ledge and advise

## 2012-01-11 DIAGNOSIS — I671 Cerebral aneurysm, nonruptured: Secondary | ICD-10-CM | POA: Diagnosis not present

## 2012-01-13 DIAGNOSIS — I671 Cerebral aneurysm, nonruptured: Secondary | ICD-10-CM | POA: Diagnosis not present

## 2012-01-24 ENCOUNTER — Encounter: Payer: Self-pay | Admitting: Internal Medicine

## 2012-01-26 DIAGNOSIS — I498 Other specified cardiac arrhythmias: Secondary | ICD-10-CM | POA: Diagnosis not present

## 2012-02-02 HISTORY — PX: CRANIOTOMY: SHX93

## 2012-02-04 DIAGNOSIS — I10 Essential (primary) hypertension: Secondary | ICD-10-CM | POA: Diagnosis not present

## 2012-02-04 DIAGNOSIS — J9600 Acute respiratory failure, unspecified whether with hypoxia or hypercapnia: Secondary | ICD-10-CM | POA: Diagnosis not present

## 2012-02-04 DIAGNOSIS — G936 Cerebral edema: Secondary | ICD-10-CM | POA: Diagnosis not present

## 2012-02-04 DIAGNOSIS — R4182 Altered mental status, unspecified: Secondary | ICD-10-CM | POA: Diagnosis not present

## 2012-02-04 DIAGNOSIS — R569 Unspecified convulsions: Secondary | ICD-10-CM | POA: Diagnosis not present

## 2012-02-04 DIAGNOSIS — E876 Hypokalemia: Secondary | ICD-10-CM | POA: Diagnosis not present

## 2012-02-04 DIAGNOSIS — D696 Thrombocytopenia, unspecified: Secondary | ICD-10-CM | POA: Diagnosis not present

## 2012-02-04 DIAGNOSIS — Z9911 Dependence on respirator [ventilator] status: Secondary | ICD-10-CM | POA: Diagnosis not present

## 2012-02-06 DIAGNOSIS — G936 Cerebral edema: Secondary | ICD-10-CM | POA: Diagnosis not present

## 2012-02-06 DIAGNOSIS — E876 Hypokalemia: Secondary | ICD-10-CM | POA: Diagnosis not present

## 2012-02-06 DIAGNOSIS — Z9911 Dependence on respirator [ventilator] status: Secondary | ICD-10-CM | POA: Diagnosis not present

## 2012-02-06 DIAGNOSIS — I10 Essential (primary) hypertension: Secondary | ICD-10-CM | POA: Diagnosis not present

## 2012-02-06 DIAGNOSIS — D696 Thrombocytopenia, unspecified: Secondary | ICD-10-CM | POA: Diagnosis not present

## 2012-02-06 DIAGNOSIS — R569 Unspecified convulsions: Secondary | ICD-10-CM | POA: Diagnosis not present

## 2012-02-06 DIAGNOSIS — J9600 Acute respiratory failure, unspecified whether with hypoxia or hypercapnia: Secondary | ICD-10-CM | POA: Diagnosis not present

## 2012-02-06 DIAGNOSIS — R4182 Altered mental status, unspecified: Secondary | ICD-10-CM | POA: Diagnosis not present

## 2012-02-07 DIAGNOSIS — J9600 Acute respiratory failure, unspecified whether with hypoxia or hypercapnia: Secondary | ICD-10-CM | POA: Diagnosis not present

## 2012-02-07 DIAGNOSIS — R4182 Altered mental status, unspecified: Secondary | ICD-10-CM | POA: Diagnosis not present

## 2012-02-07 DIAGNOSIS — G936 Cerebral edema: Secondary | ICD-10-CM | POA: Diagnosis not present

## 2012-02-07 DIAGNOSIS — Z9911 Dependence on respirator [ventilator] status: Secondary | ICD-10-CM | POA: Diagnosis not present

## 2012-02-07 DIAGNOSIS — R569 Unspecified convulsions: Secondary | ICD-10-CM | POA: Diagnosis not present

## 2012-02-07 DIAGNOSIS — I10 Essential (primary) hypertension: Secondary | ICD-10-CM | POA: Diagnosis not present

## 2012-02-07 DIAGNOSIS — D696 Thrombocytopenia, unspecified: Secondary | ICD-10-CM | POA: Diagnosis not present

## 2012-02-07 DIAGNOSIS — E876 Hypokalemia: Secondary | ICD-10-CM | POA: Diagnosis not present

## 2012-02-08 DIAGNOSIS — I10 Essential (primary) hypertension: Secondary | ICD-10-CM | POA: Diagnosis not present

## 2012-02-08 DIAGNOSIS — D696 Thrombocytopenia, unspecified: Secondary | ICD-10-CM | POA: Diagnosis not present

## 2012-02-08 DIAGNOSIS — E876 Hypokalemia: Secondary | ICD-10-CM | POA: Diagnosis not present

## 2012-02-08 DIAGNOSIS — J9600 Acute respiratory failure, unspecified whether with hypoxia or hypercapnia: Secondary | ICD-10-CM | POA: Diagnosis not present

## 2012-02-08 DIAGNOSIS — G936 Cerebral edema: Secondary | ICD-10-CM | POA: Diagnosis not present

## 2012-02-08 DIAGNOSIS — R569 Unspecified convulsions: Secondary | ICD-10-CM | POA: Diagnosis not present

## 2012-02-08 DIAGNOSIS — R4182 Altered mental status, unspecified: Secondary | ICD-10-CM | POA: Diagnosis not present

## 2012-02-08 DIAGNOSIS — Z9911 Dependence on respirator [ventilator] status: Secondary | ICD-10-CM | POA: Diagnosis not present

## 2012-02-09 DIAGNOSIS — E876 Hypokalemia: Secondary | ICD-10-CM | POA: Diagnosis not present

## 2012-02-09 DIAGNOSIS — D696 Thrombocytopenia, unspecified: Secondary | ICD-10-CM | POA: Diagnosis not present

## 2012-02-09 DIAGNOSIS — J9600 Acute respiratory failure, unspecified whether with hypoxia or hypercapnia: Secondary | ICD-10-CM | POA: Diagnosis not present

## 2012-02-09 DIAGNOSIS — I10 Essential (primary) hypertension: Secondary | ICD-10-CM | POA: Diagnosis not present

## 2012-02-09 DIAGNOSIS — R4182 Altered mental status, unspecified: Secondary | ICD-10-CM | POA: Diagnosis not present

## 2012-02-09 DIAGNOSIS — Z9911 Dependence on respirator [ventilator] status: Secondary | ICD-10-CM | POA: Diagnosis not present

## 2012-02-09 DIAGNOSIS — G936 Cerebral edema: Secondary | ICD-10-CM | POA: Diagnosis not present

## 2012-02-09 DIAGNOSIS — R569 Unspecified convulsions: Secondary | ICD-10-CM | POA: Diagnosis not present

## 2012-02-10 DIAGNOSIS — G936 Cerebral edema: Secondary | ICD-10-CM | POA: Diagnosis not present

## 2012-02-10 DIAGNOSIS — R569 Unspecified convulsions: Secondary | ICD-10-CM | POA: Diagnosis not present

## 2012-02-10 DIAGNOSIS — Z9911 Dependence on respirator [ventilator] status: Secondary | ICD-10-CM | POA: Diagnosis not present

## 2012-02-10 DIAGNOSIS — J9600 Acute respiratory failure, unspecified whether with hypoxia or hypercapnia: Secondary | ICD-10-CM | POA: Diagnosis not present

## 2012-02-10 DIAGNOSIS — E876 Hypokalemia: Secondary | ICD-10-CM | POA: Diagnosis not present

## 2012-02-10 DIAGNOSIS — R4182 Altered mental status, unspecified: Secondary | ICD-10-CM | POA: Diagnosis not present

## 2012-02-10 DIAGNOSIS — I10 Essential (primary) hypertension: Secondary | ICD-10-CM | POA: Diagnosis not present

## 2012-02-10 DIAGNOSIS — D696 Thrombocytopenia, unspecified: Secondary | ICD-10-CM | POA: Diagnosis not present

## 2012-02-11 DIAGNOSIS — G936 Cerebral edema: Secondary | ICD-10-CM | POA: Diagnosis not present

## 2012-02-11 DIAGNOSIS — R569 Unspecified convulsions: Secondary | ICD-10-CM | POA: Diagnosis not present

## 2012-02-11 DIAGNOSIS — I10 Essential (primary) hypertension: Secondary | ICD-10-CM | POA: Diagnosis not present

## 2012-02-11 DIAGNOSIS — D696 Thrombocytopenia, unspecified: Secondary | ICD-10-CM | POA: Diagnosis not present

## 2012-02-11 DIAGNOSIS — Z9911 Dependence on respirator [ventilator] status: Secondary | ICD-10-CM | POA: Diagnosis not present

## 2012-02-11 DIAGNOSIS — E876 Hypokalemia: Secondary | ICD-10-CM | POA: Diagnosis not present

## 2012-02-11 DIAGNOSIS — J9600 Acute respiratory failure, unspecified whether with hypoxia or hypercapnia: Secondary | ICD-10-CM | POA: Diagnosis not present

## 2012-02-11 DIAGNOSIS — R4182 Altered mental status, unspecified: Secondary | ICD-10-CM | POA: Diagnosis not present

## 2012-02-20 ENCOUNTER — Telehealth: Payer: Self-pay | Admitting: Internal Medicine

## 2012-02-20 DIAGNOSIS — Z659 Problem related to unspecified psychosocial circumstances: Secondary | ICD-10-CM

## 2012-02-20 DIAGNOSIS — R419 Unspecified symptoms and signs involving cognitive functions and awareness: Secondary | ICD-10-CM | POA: Insufficient documentation

## 2012-02-20 NOTE — Telephone Encounter (Signed)
Thank you very much; please keep me updated until discharge of any significantchange

## 2012-02-20 NOTE — Telephone Encounter (Signed)
Wife [name] called wanted [facility] to have an update on pt condition Pt had surgery on 8.1.13 has had numerous complications, was in ICU for 2-wks and has now been moved but is in a regular room now. CB# is pt cell [phone] if you want to call back

## 2012-02-21 ENCOUNTER — Other Ambulatory Visit: Payer: Medicare Other

## 2012-02-23 DIAGNOSIS — I6200 Nontraumatic subdural hemorrhage, unspecified: Secondary | ICD-10-CM | POA: Diagnosis not present

## 2012-02-23 DIAGNOSIS — G9340 Encephalopathy, unspecified: Secondary | ICD-10-CM | POA: Diagnosis not present

## 2012-02-23 DIAGNOSIS — J95821 Acute postprocedural respiratory failure: Secondary | ICD-10-CM | POA: Diagnosis not present

## 2012-02-23 DIAGNOSIS — I671 Cerebral aneurysm, nonruptured: Secondary | ICD-10-CM | POA: Diagnosis not present

## 2012-02-23 HISTORY — PX: OTHER SURGICAL HISTORY: SHX169

## 2012-02-23 HISTORY — PX: VENTRICULOPERITONEAL SHUNT: SHX204

## 2012-02-29 DIAGNOSIS — G40909 Epilepsy, unspecified, not intractable, without status epilepticus: Secondary | ICD-10-CM | POA: Diagnosis not present

## 2012-02-29 DIAGNOSIS — G8190 Hemiplegia, unspecified affecting unspecified side: Secondary | ICD-10-CM | POA: Diagnosis not present

## 2012-02-29 DIAGNOSIS — G4733 Obstructive sleep apnea (adult) (pediatric): Secondary | ICD-10-CM | POA: Diagnosis not present

## 2012-02-29 DIAGNOSIS — E785 Hyperlipidemia, unspecified: Secondary | ICD-10-CM | POA: Diagnosis not present

## 2012-02-29 DIAGNOSIS — R4701 Aphasia: Secondary | ICD-10-CM | POA: Diagnosis not present

## 2012-02-29 DIAGNOSIS — R5383 Other fatigue: Secondary | ICD-10-CM | POA: Diagnosis not present

## 2012-02-29 DIAGNOSIS — I69998 Other sequelae following unspecified cerebrovascular disease: Secondary | ICD-10-CM | POA: Diagnosis not present

## 2012-02-29 DIAGNOSIS — I10 Essential (primary) hypertension: Secondary | ICD-10-CM | POA: Diagnosis not present

## 2012-02-29 DIAGNOSIS — I671 Cerebral aneurysm, nonruptured: Secondary | ICD-10-CM | POA: Diagnosis not present

## 2012-02-29 DIAGNOSIS — I619 Nontraumatic intracerebral hemorrhage, unspecified: Secondary | ICD-10-CM | POA: Diagnosis not present

## 2012-02-29 DIAGNOSIS — I82409 Acute embolism and thrombosis of unspecified deep veins of unspecified lower extremity: Secondary | ICD-10-CM | POA: Diagnosis not present

## 2012-02-29 DIAGNOSIS — Z5189 Encounter for other specified aftercare: Secondary | ICD-10-CM | POA: Diagnosis not present

## 2012-02-29 DIAGNOSIS — R279 Unspecified lack of coordination: Secondary | ICD-10-CM | POA: Diagnosis not present

## 2012-02-29 DIAGNOSIS — I69959 Hemiplegia and hemiparesis following unspecified cerebrovascular disease affecting unspecified side: Secondary | ICD-10-CM | POA: Diagnosis not present

## 2012-02-29 DIAGNOSIS — G911 Obstructive hydrocephalus: Secondary | ICD-10-CM | POA: Diagnosis not present

## 2012-02-29 DIAGNOSIS — I69920 Aphasia following unspecified cerebrovascular disease: Secondary | ICD-10-CM | POA: Diagnosis not present

## 2012-02-29 DIAGNOSIS — I69919 Unspecified symptoms and signs involving cognitive functions following unspecified cerebrovascular disease: Secondary | ICD-10-CM | POA: Diagnosis not present

## 2012-02-29 DIAGNOSIS — G4700 Insomnia, unspecified: Secondary | ICD-10-CM | POA: Diagnosis not present

## 2012-02-29 DIAGNOSIS — R51 Headache: Secondary | ICD-10-CM | POA: Diagnosis not present

## 2012-02-29 DIAGNOSIS — Z982 Presence of cerebrospinal fluid drainage device: Secondary | ICD-10-CM | POA: Diagnosis not present

## 2012-02-29 DIAGNOSIS — R5381 Other malaise: Secondary | ICD-10-CM | POA: Diagnosis not present

## 2012-03-01 DIAGNOSIS — I639 Cerebral infarction, unspecified: Secondary | ICD-10-CM | POA: Insufficient documentation

## 2012-03-01 DIAGNOSIS — I63512 Cerebral infarction due to unspecified occlusion or stenosis of left middle cerebral artery: Secondary | ICD-10-CM | POA: Insufficient documentation

## 2012-03-20 DIAGNOSIS — I6350 Cerebral infarction due to unspecified occlusion or stenosis of unspecified cerebral artery: Secondary | ICD-10-CM | POA: Diagnosis not present

## 2012-03-20 DIAGNOSIS — I671 Cerebral aneurysm, nonruptured: Secondary | ICD-10-CM | POA: Diagnosis not present

## 2012-03-20 DIAGNOSIS — Z658 Other specified problems related to psychosocial circumstances: Secondary | ICD-10-CM | POA: Diagnosis not present

## 2012-03-20 DIAGNOSIS — R269 Unspecified abnormalities of gait and mobility: Secondary | ICD-10-CM | POA: Diagnosis not present

## 2012-03-22 ENCOUNTER — Ambulatory Visit (INDEPENDENT_AMBULATORY_CARE_PROVIDER_SITE_OTHER): Payer: Medicare Other | Admitting: Internal Medicine

## 2012-03-22 ENCOUNTER — Encounter: Payer: Self-pay | Admitting: Internal Medicine

## 2012-03-22 ENCOUNTER — Telehealth: Payer: Self-pay | Admitting: Internal Medicine

## 2012-03-22 DIAGNOSIS — S6980XA Other specified injuries of unspecified wrist, hand and finger(s), initial encounter: Secondary | ICD-10-CM

## 2012-03-22 DIAGNOSIS — S6990XA Unspecified injury of unspecified wrist, hand and finger(s), initial encounter: Secondary | ICD-10-CM | POA: Diagnosis not present

## 2012-03-22 DIAGNOSIS — I951 Orthostatic hypotension: Secondary | ICD-10-CM | POA: Diagnosis not present

## 2012-03-22 MED ORDER — AMLODIPINE BESYLATE 5 MG PO TABS: ORAL_TABLET | ORAL | Status: DC

## 2012-03-22 NOTE — Telephone Encounter (Signed)
Caller: Jamie/Spouse; Patient Name: Jeff Weaver; PCP: Unice Cobble; [place] Phone Number: [phone]; Reason for call: Fall yesterday.  Injury to middle finger of right hand.  Patient recently had Brain surgery with Rehabilitation and just came home 03/16/12.  He fell yesterday, while attempting to walk, fall from unsteady surface.  Slight swelling of finger and ? slightly  "crooked" per wife . He is able to move is hand and finger, no numbness, no bruises to finger. Treatment at home with ice, tylenol and splinting.  Wife requesting an appointment for evaluation.  Scheduled today with Dr. Linna Darner 03/22/12 at 1:15.  Emergent s/sx ruled out per Hand injury Protocol with exception to "new marked swelling for at least 24 hours that has not improved with home care". See Provider in 24 hours.  Reviewed home care understanding expressed. Will call back for questions, changes or concerns.

## 2012-03-22 NOTE — Telephone Encounter (Signed)
Per Dr.Hopper protocol if patient with scheduled appointment or ER/Urgent Care recommended ok to close encounter

## 2012-03-22 NOTE — Progress Notes (Signed)
  Subjective:    Patient ID: Jeff Weaver, male    DOB: [birth year], 66 y.o.   MRN: [id number]  HPI He fell 9/18 when he reached for a hose ; he fell on his right buttocks area sustaining a bruise but also apparently injured his right middle finger. It has remained swollen with some decreased range of motion since.  He is status post aneurysm resection at [facility] and is presently in OP rehabilitation.    Review of Systems Blood pressures have not been monitored at home. He denies chest pain, palpitations, dyspnea, claudication, or edema.  The blood pressure regimen was changed to amlodipine 10 mg daily and Microzide 12.5 mg daily at [facility]    Objective:   Physical Exam He  well-nourished; he is in no acute distress  No carotid bruits are present.  Heart rhythm and rate are normal with no significant murmurs or gallops.  Chest is clear with no increased work of breathing  There is no evidence of aortic aneurysm or renal artery bruits. Shunt  Op scar RUQ  He has no clubbing or edema.   Pedal pulses are intact   No ischemic skin changes are present   He exhibits some confusion as to history; his wife and daughter clarified hx & medication regimen / changes made at [facility]  He is able to make a fist with his right hand. There is fusiform swelling of the PIP joint of the third right digit.  Postural BP data: Supine : 110/86 Sitting : 102/84 Standing: 98/80        Assessment & Plan:  #1 blunt trauma third right digit; clinically no evidence of fracture. Splinting will be encouraged until the swelling resolves.  #2 hypertension, questionable overcorrection which could contribute to his gait instability. See recommendations    #3 status post CNS aneurysm resection with some gait instability. He will continue outpatient physical therapy

## 2012-03-22 NOTE — Patient Instructions (Addendum)
Blood Pressure Goal  Ideally is an AVERAGE < 135/85. This AVERAGE should be calculated from @ least 5-7 BP readings taken @ different times of day on different days of week. You should not respond to isolated BP readings , but rather the AVERAGE for that week Repeat the isometric exercises discussed 4- 5 times prior to standing if you've been seated for a period of time.

## 2012-03-28 DIAGNOSIS — I6350 Cerebral infarction due to unspecified occlusion or stenosis of unspecified cerebral artery: Secondary | ICD-10-CM | POA: Diagnosis not present

## 2012-03-28 DIAGNOSIS — Z658 Other specified problems related to psychosocial circumstances: Secondary | ICD-10-CM | POA: Diagnosis not present

## 2012-03-28 DIAGNOSIS — I671 Cerebral aneurysm, nonruptured: Secondary | ICD-10-CM | POA: Diagnosis not present

## 2012-03-28 DIAGNOSIS — R269 Unspecified abnormalities of gait and mobility: Secondary | ICD-10-CM | POA: Diagnosis not present

## 2012-03-29 DIAGNOSIS — Z658 Other specified problems related to psychosocial circumstances: Secondary | ICD-10-CM | POA: Diagnosis not present

## 2012-03-29 DIAGNOSIS — R269 Unspecified abnormalities of gait and mobility: Secondary | ICD-10-CM | POA: Diagnosis not present

## 2012-03-29 DIAGNOSIS — I6350 Cerebral infarction due to unspecified occlusion or stenosis of unspecified cerebral artery: Secondary | ICD-10-CM | POA: Diagnosis not present

## 2012-03-29 DIAGNOSIS — I671 Cerebral aneurysm, nonruptured: Secondary | ICD-10-CM | POA: Diagnosis not present

## 2012-03-30 ENCOUNTER — Ambulatory Visit (INDEPENDENT_AMBULATORY_CARE_PROVIDER_SITE_OTHER): Payer: Medicare Other | Admitting: Internal Medicine

## 2012-03-30 ENCOUNTER — Encounter: Payer: Self-pay | Admitting: Internal Medicine

## 2012-03-30 VITALS — BP 110/74 | HR 84 | Temp 98.1°F | Wt 208.8 lb

## 2012-03-30 DIAGNOSIS — R3915 Urgency of urination: Secondary | ICD-10-CM | POA: Diagnosis not present

## 2012-03-30 DIAGNOSIS — I10 Essential (primary) hypertension: Secondary | ICD-10-CM | POA: Diagnosis not present

## 2012-03-30 LAB — POCT URINALYSIS DIPSTICK
Bilirubin, UA: NEGATIVE
Blood, UA: NEGATIVE
Glucose, UA: NEGATIVE
Ketones, UA: NEGATIVE
Leukocytes, UA: NEGATIVE
Nitrite, UA: NEGATIVE
Protein, UA: NEGATIVE
Spec Grav, UA: 1.015
Urobilinogen, UA: 0.2
pH, UA: 6

## 2012-03-30 MED ORDER — AMLODIPINE BESYLATE 2.5 MG PO TABS: 2.5000 mg | ORAL_TABLET | Freq: Every day | ORAL | Status: DC

## 2012-03-30 NOTE — Patient Instructions (Addendum)
Blood Pressure Goal  Ideally is an AVERAGE < 135/85; ideally < 130/80. This AVERAGE should be calculated from @ least 5-7 BP readings taken @ different times of day on different days of week. You should not respond to isolated BP readings , but rather the AVERAGE for that week

## 2012-03-30 NOTE — Progress Notes (Signed)
  Subjective:    Patient ID: Jeff Weaver, male    DOB: 1946-02-12, 66 y.o.   MRN: [id number]  HPI CHRONIC HYPERTENSION: Disease Monitoring  Blood pressure range: 85/60-131/86 since Amlodipine decreased  Chest pain: no   Dyspnea:no  Claudication:no  Medication compliance: no Medication Side Effects  Lightheadedness: upon arising; isometrics employed @ times   Urinary frequency: no ,urgency   Edema: no   Preventitive Healthcare:  Exercise: in Rehab until 11/13  Diet Pattern: no  Salt Restriction: yes       Review of Systems  He denies this urea, pyuria, or hematuria. He's also had no fever, chills, or sweats. His wife describes nocturia 3-4 times a night. He was placed on Tamulosin prior to discontinuation of the   Foley catheter.  At night he does notice a sense of warmth over the left crown and anterior neck. There is no associated rash or change in color of the skin  He describes intermittent discomfort in the abdomen in the area of the shunt. This is brought on when he begins to mobilize after sitting. He denies changes in stool, melena, rectal bleeding.  He was placed on Ritalin by the rehabilitation specialist; they're questioning whether this should be continued. This question should be addressed by his neurosurgeon. Additionally the neurosurgeon for comment on continuing the trazodone.     Objective:   Physical Exam General appearance:well nourished; no acute distress or increased work of breathing is present.  No  lymphadenopathy about the head, neck, or axilla noted.   Eyes: No conjunctival inflammation or lid edema is present. There is no scleral icterus.  Ears:  External ear exam shows no significant lesions or deformities.  Otoscopic examination reveals clear canals, tympanic membranes are intact bilaterally without bulging, retraction, inflammation or discharge.  Neck:  No deformities, thyromegaly, masses, or tenderness noted.   Supple with full range of motion  without pain.   Heart:  Normal rate and regular rhythm. S1 and S2 normal without gallop, murmur, click, rub or other extra sounds.   Lungs:Chest clear to auscultation; no wheezes, rhonchi,rales ,or rubs present.No increased work of breathing  Abdomen: bowel sounds normal, soft and non-tender without masses, organomegaly or hernias noted.  No guarding or rebound   Extremities: no edema, or clubbing  noted    Skin: Warm & dry ; no rash          Assessment & Plan:  #1 hypertension, excellent control. He does have some postural hypotension. Isometric exercises discussed. Amlodipine dose will be changed  #2 urgency; urinalysis will be collected  #3 nocturnal sensation of heat over the crown and neck. Temperature diary will be requested. This may relate to the CNS surgery

## 2012-04-01 LAB — URINE CULTURE
Colony Count: NO GROWTH
Organism ID, Bacteria: NO GROWTH

## 2012-04-03 DIAGNOSIS — I6350 Cerebral infarction due to unspecified occlusion or stenosis of unspecified cerebral artery: Secondary | ICD-10-CM | POA: Diagnosis not present

## 2012-04-03 DIAGNOSIS — R269 Unspecified abnormalities of gait and mobility: Secondary | ICD-10-CM | POA: Diagnosis not present

## 2012-04-03 DIAGNOSIS — Z658 Other specified problems related to psychosocial circumstances: Secondary | ICD-10-CM | POA: Diagnosis not present

## 2012-04-03 DIAGNOSIS — R4189 Other symptoms and signs involving cognitive functions and awareness: Secondary | ICD-10-CM | POA: Diagnosis not present

## 2012-04-06 DIAGNOSIS — Z658 Other specified problems related to psychosocial circumstances: Secondary | ICD-10-CM | POA: Diagnosis not present

## 2012-04-06 DIAGNOSIS — R4189 Other symptoms and signs involving cognitive functions and awareness: Secondary | ICD-10-CM | POA: Diagnosis not present

## 2012-04-06 DIAGNOSIS — I6350 Cerebral infarction due to unspecified occlusion or stenosis of unspecified cerebral artery: Secondary | ICD-10-CM | POA: Diagnosis not present

## 2012-04-06 DIAGNOSIS — R269 Unspecified abnormalities of gait and mobility: Secondary | ICD-10-CM | POA: Diagnosis not present

## 2012-04-10 ENCOUNTER — Encounter: Payer: Self-pay | Admitting: Internal Medicine

## 2012-04-10 ENCOUNTER — Ambulatory Visit (INDEPENDENT_AMBULATORY_CARE_PROVIDER_SITE_OTHER): Payer: Medicare Other | Admitting: Internal Medicine

## 2012-04-10 VITALS — BP 140/80 | HR 75 | Temp 98.3°F | Wt 211.8 lb

## 2012-04-10 DIAGNOSIS — Z23 Encounter for immunization: Secondary | ICD-10-CM | POA: Diagnosis not present

## 2012-04-10 DIAGNOSIS — I6350 Cerebral infarction due to unspecified occlusion or stenosis of unspecified cerebral artery: Secondary | ICD-10-CM | POA: Diagnosis not present

## 2012-04-10 DIAGNOSIS — R4189 Other symptoms and signs involving cognitive functions and awareness: Secondary | ICD-10-CM | POA: Diagnosis not present

## 2012-04-10 DIAGNOSIS — R269 Unspecified abnormalities of gait and mobility: Secondary | ICD-10-CM | POA: Diagnosis not present

## 2012-04-10 DIAGNOSIS — Z658 Other specified problems related to psychosocial circumstances: Secondary | ICD-10-CM | POA: Diagnosis not present

## 2012-04-10 DIAGNOSIS — I10 Essential (primary) hypertension: Secondary | ICD-10-CM

## 2012-04-10 MED ORDER — AMLODIPINE BESYLATE 2.5 MG PO TABS: ORAL_TABLET | ORAL | Status: DC

## 2012-04-10 NOTE — Assessment & Plan Note (Signed)
The amlodipine will be increased to 2.5 mg twice a day. Focus will be on the average blood pressure for the week rather than on  the significant extremes

## 2012-04-10 NOTE — Patient Instructions (Addendum)
Blood Pressure Goal  Ideally is an AVERAGE < 135/85. This AVERAGE should be calculated from @ least 5-7 BP readings taken @ different times of day on different days of week. You should not respond to isolated BP readings , but rather the AVERAGE for that week . Repeat the isometric exercises discussed 4- 5 times prior to standing if you've been seated for a period of time.

## 2012-04-10 NOTE — Progress Notes (Signed)
  Subjective:    Patient ID: Jeff Weaver, male    DOB: 1946/02/02, 66 y.o.   MRN: [id number]  HPI There continues to be a dramatic variance in blood pressures; the range is 85/60-158/102   .  He denies exertional  chest pain, palpitations, claudication, or edema. He does have some postural symptoms of lightheadedness almost daily when standing up.  He has had some dyspnea on an incline during his physical therapy exercises.      Review of Systems   His wife has discussed the Ritalin and trazodone with a neurosurgeon; it is anticipated that these will be discontinued at the next appointment later this month.     Objective:   Physical Exam He appears  well-nourished; he is in no acute distress  No carotid bruits are present.  Heart rhythm and rate are normal with no significant murmurs or gallops.S4  Chest is clear with no increased work of breathing  There is no evidence of aortic aneurysm or renal artery bruits  He has no clubbing or edema.   Pedal pulses are intact   No ischemic skin changes are present         Assessment & Plan:

## 2012-04-13 DIAGNOSIS — Z658 Other specified problems related to psychosocial circumstances: Secondary | ICD-10-CM | POA: Diagnosis not present

## 2012-04-13 DIAGNOSIS — H25819 Combined forms of age-related cataract, unspecified eye: Secondary | ICD-10-CM | POA: Insufficient documentation

## 2012-04-13 DIAGNOSIS — R4189 Other symptoms and signs involving cognitive functions and awareness: Secondary | ICD-10-CM | POA: Diagnosis not present

## 2012-04-13 DIAGNOSIS — H524 Presbyopia: Secondary | ICD-10-CM | POA: Insufficient documentation

## 2012-04-13 DIAGNOSIS — I6350 Cerebral infarction due to unspecified occlusion or stenosis of unspecified cerebral artery: Secondary | ICD-10-CM | POA: Diagnosis not present

## 2012-04-13 DIAGNOSIS — R269 Unspecified abnormalities of gait and mobility: Secondary | ICD-10-CM | POA: Diagnosis not present

## 2012-04-16 DIAGNOSIS — I6350 Cerebral infarction due to unspecified occlusion or stenosis of unspecified cerebral artery: Secondary | ICD-10-CM | POA: Diagnosis not present

## 2012-04-16 DIAGNOSIS — G4733 Obstructive sleep apnea (adult) (pediatric): Secondary | ICD-10-CM | POA: Diagnosis not present

## 2012-04-16 DIAGNOSIS — Z658 Other specified problems related to psychosocial circumstances: Secondary | ICD-10-CM | POA: Diagnosis not present

## 2012-04-16 DIAGNOSIS — R569 Unspecified convulsions: Secondary | ICD-10-CM | POA: Diagnosis not present

## 2012-04-16 DIAGNOSIS — G40209 Localization-related (focal) (partial) symptomatic epilepsy and epileptic syndromes with complex partial seizures, not intractable, without status epilepticus: Secondary | ICD-10-CM | POA: Insufficient documentation

## 2012-04-16 DIAGNOSIS — R269 Unspecified abnormalities of gait and mobility: Secondary | ICD-10-CM | POA: Diagnosis not present

## 2012-04-16 DIAGNOSIS — R4189 Other symptoms and signs involving cognitive functions and awareness: Secondary | ICD-10-CM | POA: Diagnosis not present

## 2012-04-17 DIAGNOSIS — R269 Unspecified abnormalities of gait and mobility: Secondary | ICD-10-CM | POA: Diagnosis not present

## 2012-04-17 DIAGNOSIS — I6350 Cerebral infarction due to unspecified occlusion or stenosis of unspecified cerebral artery: Secondary | ICD-10-CM | POA: Diagnosis not present

## 2012-04-20 DIAGNOSIS — R269 Unspecified abnormalities of gait and mobility: Secondary | ICD-10-CM | POA: Diagnosis not present

## 2012-04-20 DIAGNOSIS — I6350 Cerebral infarction due to unspecified occlusion or stenosis of unspecified cerebral artery: Secondary | ICD-10-CM | POA: Diagnosis not present

## 2012-04-20 DIAGNOSIS — R4189 Other symptoms and signs involving cognitive functions and awareness: Secondary | ICD-10-CM | POA: Diagnosis not present

## 2012-04-20 DIAGNOSIS — Z658 Other specified problems related to psychosocial circumstances: Secondary | ICD-10-CM | POA: Diagnosis not present

## 2012-04-24 ENCOUNTER — Ambulatory Visit (INDEPENDENT_AMBULATORY_CARE_PROVIDER_SITE_OTHER): Payer: Medicare Other | Admitting: Internal Medicine

## 2012-04-24 ENCOUNTER — Encounter: Payer: Self-pay | Admitting: Internal Medicine

## 2012-04-24 VITALS — BP 126/80 | HR 77 | Temp 98.4°F | Wt 214.0 lb

## 2012-04-24 DIAGNOSIS — I10 Essential (primary) hypertension: Secondary | ICD-10-CM

## 2012-04-24 MED ORDER — LOSARTAN POTASSIUM 100 MG PO TABS: ORAL_TABLET | ORAL | Status: DC

## 2012-04-24 NOTE — Patient Instructions (Addendum)
Blood Pressure Goal  Ideally is an AVERAGE < 135/85. This AVERAGE should be calculated from @ least 5-7 BP readings taken @ different times of day on different days of week. You should not respond to isolated BP readings , but rather the AVERAGE for that week .  Please see me before refilling the  3 blood pressure medications; these will be consolidated to reduce the number of pills or taking daily.

## 2012-04-24 NOTE — Assessment & Plan Note (Signed)
ARB added with titration if needed .Blood Pressure Goal  Ideally is an AVERAGE < 135/85. This AVERAGE should be calculated from @ least 5-7 BP readings taken @ different times of day on different days of week. You should not respond to isolated BP readings , but rather the AVERAGE for that week

## 2012-04-24 NOTE — Progress Notes (Signed)
  Subjective:    Patient ID: Chou Busler, male    DOB: [birth year], 66 y.o.   MRN: [id number]  HPI  His blood pressure for the last 40 readings has averaged 136/87; there's been a significant variability with a range of 93/66-176/100 after adding Amlodipine 2.5 mg twice a day.  He has an upcoming appointment with his neurosurgeon. His major concern is some short-term memory issues   Review of Systems He denies chest pain, palpitations, dyspnea, claudication, edema, epistaxis, or significant headache. He denies significant dizziness or lightheadedness     Objective:   Physical Exam He appears healthy and well-nourished; he is in no acute distress. Arcus present No carotid bruits are present. Heart rhythm and rate are normal with no significant murmurs or gallops. Chest is clear with no increased work of breathing There is no evidence of aortic aneurysm or renal artery bruits He has no clubbing or edema. Pedal pulses are intact  No ischemic skin changes are present  Oriented x3; 2 of 3 items recalled; "WORLD" spelled backwards: "DROW"        Assessment & Plan:

## 2012-04-27 DIAGNOSIS — Z48811 Encounter for surgical aftercare following surgery on the nervous system: Secondary | ICD-10-CM | POA: Insufficient documentation

## 2012-04-27 DIAGNOSIS — Z48812 Encounter for surgical aftercare following surgery on the circulatory system: Secondary | ICD-10-CM | POA: Insufficient documentation

## 2012-04-27 DIAGNOSIS — I729 Aneurysm of unspecified site: Secondary | ICD-10-CM | POA: Diagnosis not present

## 2012-04-27 DIAGNOSIS — R51 Headache: Secondary | ICD-10-CM | POA: Diagnosis not present

## 2012-04-30 ENCOUNTER — Ambulatory Visit: Payer: Medicare Other | Admitting: Internal Medicine

## 2012-05-10 DIAGNOSIS — I6350 Cerebral infarction due to unspecified occlusion or stenosis of unspecified cerebral artery: Secondary | ICD-10-CM | POA: Diagnosis not present

## 2012-05-10 DIAGNOSIS — I69919 Unspecified symptoms and signs involving cognitive functions following unspecified cerebrovascular disease: Secondary | ICD-10-CM | POA: Diagnosis not present

## 2012-05-10 DIAGNOSIS — F09 Unspecified mental disorder due to known physiological condition: Secondary | ICD-10-CM | POA: Diagnosis not present

## 2012-05-10 DIAGNOSIS — H1130 Conjunctival hemorrhage, unspecified eye: Secondary | ICD-10-CM | POA: Diagnosis not present

## 2012-05-10 DIAGNOSIS — IMO0001 Reserved for inherently not codable concepts without codable children: Secondary | ICD-10-CM | POA: Diagnosis not present

## 2012-05-10 DIAGNOSIS — I671 Cerebral aneurysm, nonruptured: Secondary | ICD-10-CM | POA: Diagnosis not present

## 2012-05-10 DIAGNOSIS — G911 Obstructive hydrocephalus: Secondary | ICD-10-CM | POA: Diagnosis not present

## 2012-05-14 DIAGNOSIS — F09 Unspecified mental disorder due to known physiological condition: Secondary | ICD-10-CM | POA: Diagnosis not present

## 2012-05-14 DIAGNOSIS — G911 Obstructive hydrocephalus: Secondary | ICD-10-CM | POA: Diagnosis not present

## 2012-05-14 DIAGNOSIS — I671 Cerebral aneurysm, nonruptured: Secondary | ICD-10-CM | POA: Diagnosis not present

## 2012-05-14 DIAGNOSIS — H1130 Conjunctival hemorrhage, unspecified eye: Secondary | ICD-10-CM | POA: Diagnosis not present

## 2012-05-14 DIAGNOSIS — I69919 Unspecified symptoms and signs involving cognitive functions following unspecified cerebrovascular disease: Secondary | ICD-10-CM | POA: Diagnosis not present

## 2012-05-14 DIAGNOSIS — I6350 Cerebral infarction due to unspecified occlusion or stenosis of unspecified cerebral artery: Secondary | ICD-10-CM | POA: Diagnosis not present

## 2012-05-18 DIAGNOSIS — I69919 Unspecified symptoms and signs involving cognitive functions following unspecified cerebrovascular disease: Secondary | ICD-10-CM | POA: Diagnosis not present

## 2012-05-18 DIAGNOSIS — F09 Unspecified mental disorder due to known physiological condition: Secondary | ICD-10-CM | POA: Diagnosis not present

## 2012-05-18 DIAGNOSIS — G911 Obstructive hydrocephalus: Secondary | ICD-10-CM | POA: Diagnosis not present

## 2012-05-18 DIAGNOSIS — I671 Cerebral aneurysm, nonruptured: Secondary | ICD-10-CM | POA: Diagnosis not present

## 2012-05-18 DIAGNOSIS — I6350 Cerebral infarction due to unspecified occlusion or stenosis of unspecified cerebral artery: Secondary | ICD-10-CM | POA: Diagnosis not present

## 2012-05-18 DIAGNOSIS — H903 Sensorineural hearing loss, bilateral: Secondary | ICD-10-CM | POA: Diagnosis not present

## 2012-05-18 DIAGNOSIS — H1130 Conjunctival hemorrhage, unspecified eye: Secondary | ICD-10-CM | POA: Diagnosis not present

## 2012-05-23 DIAGNOSIS — I6350 Cerebral infarction due to unspecified occlusion or stenosis of unspecified cerebral artery: Secondary | ICD-10-CM | POA: Diagnosis not present

## 2012-05-23 DIAGNOSIS — I671 Cerebral aneurysm, nonruptured: Secondary | ICD-10-CM | POA: Diagnosis not present

## 2012-05-23 DIAGNOSIS — I69919 Unspecified symptoms and signs involving cognitive functions following unspecified cerebrovascular disease: Secondary | ICD-10-CM | POA: Diagnosis not present

## 2012-05-23 DIAGNOSIS — G911 Obstructive hydrocephalus: Secondary | ICD-10-CM | POA: Diagnosis not present

## 2012-05-23 DIAGNOSIS — F09 Unspecified mental disorder due to known physiological condition: Secondary | ICD-10-CM | POA: Diagnosis not present

## 2012-05-23 DIAGNOSIS — H1130 Conjunctival hemorrhage, unspecified eye: Secondary | ICD-10-CM | POA: Diagnosis not present

## 2012-05-25 DIAGNOSIS — I6350 Cerebral infarction due to unspecified occlusion or stenosis of unspecified cerebral artery: Secondary | ICD-10-CM | POA: Diagnosis not present

## 2012-05-25 DIAGNOSIS — I671 Cerebral aneurysm, nonruptured: Secondary | ICD-10-CM | POA: Diagnosis not present

## 2012-05-25 DIAGNOSIS — F09 Unspecified mental disorder due to known physiological condition: Secondary | ICD-10-CM | POA: Diagnosis not present

## 2012-05-25 DIAGNOSIS — I69919 Unspecified symptoms and signs involving cognitive functions following unspecified cerebrovascular disease: Secondary | ICD-10-CM | POA: Diagnosis not present

## 2012-05-25 DIAGNOSIS — G911 Obstructive hydrocephalus: Secondary | ICD-10-CM | POA: Diagnosis not present

## 2012-05-25 DIAGNOSIS — H1130 Conjunctival hemorrhage, unspecified eye: Secondary | ICD-10-CM | POA: Diagnosis not present

## 2012-05-28 DIAGNOSIS — I69919 Unspecified symptoms and signs involving cognitive functions following unspecified cerebrovascular disease: Secondary | ICD-10-CM | POA: Diagnosis not present

## 2012-05-28 DIAGNOSIS — H1130 Conjunctival hemorrhage, unspecified eye: Secondary | ICD-10-CM | POA: Diagnosis not present

## 2012-05-28 DIAGNOSIS — F09 Unspecified mental disorder due to known physiological condition: Secondary | ICD-10-CM | POA: Diagnosis not present

## 2012-05-28 DIAGNOSIS — I6350 Cerebral infarction due to unspecified occlusion or stenosis of unspecified cerebral artery: Secondary | ICD-10-CM | POA: Diagnosis not present

## 2012-05-28 DIAGNOSIS — I671 Cerebral aneurysm, nonruptured: Secondary | ICD-10-CM | POA: Diagnosis not present

## 2012-05-28 DIAGNOSIS — G911 Obstructive hydrocephalus: Secondary | ICD-10-CM | POA: Diagnosis not present

## 2012-05-30 DIAGNOSIS — I6350 Cerebral infarction due to unspecified occlusion or stenosis of unspecified cerebral artery: Secondary | ICD-10-CM | POA: Diagnosis not present

## 2012-05-30 DIAGNOSIS — H1130 Conjunctival hemorrhage, unspecified eye: Secondary | ICD-10-CM | POA: Diagnosis not present

## 2012-05-30 DIAGNOSIS — F09 Unspecified mental disorder due to known physiological condition: Secondary | ICD-10-CM | POA: Diagnosis not present

## 2012-05-30 DIAGNOSIS — I69919 Unspecified symptoms and signs involving cognitive functions following unspecified cerebrovascular disease: Secondary | ICD-10-CM | POA: Diagnosis not present

## 2012-05-30 DIAGNOSIS — G911 Obstructive hydrocephalus: Secondary | ICD-10-CM | POA: Diagnosis not present

## 2012-05-30 DIAGNOSIS — I671 Cerebral aneurysm, nonruptured: Secondary | ICD-10-CM | POA: Diagnosis not present

## 2012-06-12 DIAGNOSIS — IMO0001 Reserved for inherently not codable concepts without codable children: Secondary | ICD-10-CM | POA: Diagnosis not present

## 2012-06-12 DIAGNOSIS — I69919 Unspecified symptoms and signs involving cognitive functions following unspecified cerebrovascular disease: Secondary | ICD-10-CM | POA: Diagnosis not present

## 2012-06-12 DIAGNOSIS — F09 Unspecified mental disorder due to known physiological condition: Secondary | ICD-10-CM | POA: Diagnosis not present

## 2012-06-19 DIAGNOSIS — F09 Unspecified mental disorder due to known physiological condition: Secondary | ICD-10-CM | POA: Diagnosis not present

## 2012-06-19 DIAGNOSIS — IMO0001 Reserved for inherently not codable concepts without codable children: Secondary | ICD-10-CM | POA: Diagnosis not present

## 2012-06-19 DIAGNOSIS — I69919 Unspecified symptoms and signs involving cognitive functions following unspecified cerebrovascular disease: Secondary | ICD-10-CM | POA: Diagnosis not present

## 2012-06-25 DIAGNOSIS — IMO0001 Reserved for inherently not codable concepts without codable children: Secondary | ICD-10-CM | POA: Diagnosis not present

## 2012-06-25 DIAGNOSIS — I69919 Unspecified symptoms and signs involving cognitive functions following unspecified cerebrovascular disease: Secondary | ICD-10-CM | POA: Diagnosis not present

## 2012-06-25 DIAGNOSIS — F09 Unspecified mental disorder due to known physiological condition: Secondary | ICD-10-CM | POA: Diagnosis not present

## 2012-07-10 DIAGNOSIS — H2589 Other age-related cataract: Secondary | ICD-10-CM | POA: Diagnosis not present

## 2012-07-10 DIAGNOSIS — H52209 Unspecified astigmatism, unspecified eye: Secondary | ICD-10-CM | POA: Diagnosis not present

## 2012-07-10 DIAGNOSIS — H02009 Unspecified entropion of unspecified eye, unspecified eyelid: Secondary | ICD-10-CM | POA: Diagnosis not present

## 2012-07-10 DIAGNOSIS — H5200 Hypermetropia, unspecified eye: Secondary | ICD-10-CM | POA: Insufficient documentation

## 2012-07-10 DIAGNOSIS — H524 Presbyopia: Secondary | ICD-10-CM | POA: Diagnosis not present

## 2012-07-13 ENCOUNTER — Encounter: Payer: Self-pay | Admitting: *Deleted

## 2012-07-13 ENCOUNTER — Encounter: Payer: Self-pay | Admitting: Internal Medicine

## 2012-07-13 ENCOUNTER — Ambulatory Visit (INDEPENDENT_AMBULATORY_CARE_PROVIDER_SITE_OTHER): Payer: Medicare Other | Admitting: Internal Medicine

## 2012-07-13 VITALS — BP 124/80 | HR 73 | Wt 239.0 lb

## 2012-07-13 DIAGNOSIS — N400 Enlarged prostate without lower urinary tract symptoms: Secondary | ICD-10-CM | POA: Diagnosis not present

## 2012-07-13 DIAGNOSIS — R569 Unspecified convulsions: Secondary | ICD-10-CM | POA: Diagnosis not present

## 2012-07-13 DIAGNOSIS — K219 Gastro-esophageal reflux disease without esophagitis: Secondary | ICD-10-CM | POA: Diagnosis not present

## 2012-07-13 DIAGNOSIS — J310 Chronic rhinitis: Secondary | ICD-10-CM

## 2012-07-13 DIAGNOSIS — G40909 Epilepsy, unspecified, not intractable, without status epilepticus: Secondary | ICD-10-CM | POA: Diagnosis not present

## 2012-07-13 DIAGNOSIS — R7309 Other abnormal glucose: Secondary | ICD-10-CM | POA: Diagnosis not present

## 2012-07-13 DIAGNOSIS — G8389 Other specified paralytic syndromes: Secondary | ICD-10-CM | POA: Diagnosis not present

## 2012-07-13 DIAGNOSIS — L740 Miliaria rubra: Secondary | ICD-10-CM

## 2012-07-13 DIAGNOSIS — I10 Essential (primary) hypertension: Secondary | ICD-10-CM

## 2012-07-13 DIAGNOSIS — R351 Nocturia: Secondary | ICD-10-CM | POA: Diagnosis not present

## 2012-07-13 DIAGNOSIS — Z982 Presence of cerebrospinal fluid drainage device: Secondary | ICD-10-CM | POA: Diagnosis not present

## 2012-07-13 DIAGNOSIS — R4789 Other speech disturbances: Secondary | ICD-10-CM | POA: Diagnosis not present

## 2012-07-13 DIAGNOSIS — R7301 Impaired fasting glucose: Secondary | ICD-10-CM | POA: Diagnosis not present

## 2012-07-13 DIAGNOSIS — R918 Other nonspecific abnormal finding of lung field: Secondary | ICD-10-CM | POA: Diagnosis not present

## 2012-07-13 DIAGNOSIS — G911 Obstructive hydrocephalus: Secondary | ICD-10-CM | POA: Diagnosis not present

## 2012-07-13 DIAGNOSIS — N9989 Other postprocedural complications and disorders of genitourinary system: Secondary | ICD-10-CM | POA: Diagnosis not present

## 2012-07-13 DIAGNOSIS — IMO0002 Reserved for concepts with insufficient information to code with codable children: Secondary | ICD-10-CM | POA: Diagnosis not present

## 2012-07-13 DIAGNOSIS — R29810 Facial weakness: Secondary | ICD-10-CM | POA: Diagnosis not present

## 2012-07-13 DIAGNOSIS — I6350 Cerebral infarction due to unspecified occlusion or stenosis of unspecified cerebral artery: Secondary | ICD-10-CM | POA: Diagnosis not present

## 2012-07-13 DIAGNOSIS — E782 Mixed hyperlipidemia: Secondary | ICD-10-CM | POA: Diagnosis not present

## 2012-07-13 DIAGNOSIS — G9389 Other specified disorders of brain: Secondary | ICD-10-CM | POA: Diagnosis present

## 2012-07-13 DIAGNOSIS — R339 Retention of urine, unspecified: Secondary | ICD-10-CM | POA: Diagnosis not present

## 2012-07-13 DIAGNOSIS — Z79899 Other long term (current) drug therapy: Secondary | ICD-10-CM | POA: Diagnosis not present

## 2012-07-13 DIAGNOSIS — M069 Rheumatoid arthritis, unspecified: Secondary | ICD-10-CM | POA: Diagnosis not present

## 2012-07-13 DIAGNOSIS — E65 Localized adiposity: Secondary | ICD-10-CM

## 2012-07-13 DIAGNOSIS — R350 Frequency of micturition: Secondary | ICD-10-CM

## 2012-07-13 DIAGNOSIS — E785 Hyperlipidemia, unspecified: Secondary | ICD-10-CM | POA: Diagnosis not present

## 2012-07-13 DIAGNOSIS — G40802 Other epilepsy, not intractable, without status epilepticus: Secondary | ICD-10-CM | POA: Diagnosis not present

## 2012-07-13 MED ORDER — FLUTICASONE PROPIONATE 50 MCG/ACT NA SUSP: 1.0000 | Freq: Two times a day (BID) | NASAL | Status: DC | PRN

## 2012-07-13 NOTE — Progress Notes (Signed)
  Subjective:    Patient ID: Jeff Weaver, male    DOB: 1945/10/08, 67 y.o.   MRN: [id number]  HPI CHRONIC HYPERTENSION: Disease Monitoring  Blood pressure range: 130-137/70-84  Chest pain: no   Dyspnea:no   Claudication: no   Medication compliance: yes Medication Side Effects  Lightheadedness: initially in am or standing from chair  Urinary frequency:yes, nocturia every 2-3 hrs; on Tamsulosin from [facility]  Edema: some   Preventitive Healthcare:  Exercise: walking 2 mpd 3 x/ week   Diet Pattern: no plan  Salt Restriction: yes     Review of Systems  He was told he had a rash of the anterior chest by a physician at the [place]. He is not aware of it. It is not itching.  Despite the frequency and nocturia; he denies hematuria, pyuria, or dysuria.  The edema is intermittent and he questions relationship to the medications.  He is concerned about central weight gain  Dyspepsia responds to Pepcid AC as needed. He has occasional dysphagia with meat  Generic Afrin used for nasal congestion    Objective:   Physical Exam Gen.: well-nourished in appearance. Alert, appropriate and cooperative throughout exam.  Eyes: No corneal or conjunctival inflammation noted. Arcus senilis Mouth: Oral mucosa and oropharynx reveal no lesions or exudates. Teeth in good repair. Nose: decreased flow on L Neck: No deformities, masses, or tenderness noted.  Thyroid normal. Lungs: Normal respiratory effort; chest expands symmetrically. Lungs are clear to auscultation without rales, wheezes, or increased work of breathing. Heart: Normal rate and rhythm. Normal S1 and S2. No gallop, click, or rub.S4 w/o murmur. Abdomen: Bowel sounds normal; abdomen soft and nontender. No masses, organomegaly or hernias noted.Protuberant                                                                                  Musculoskeletal/extremities: No clubbing, cyanosis, edema, or deformity noted. Range of motion  normal .Tone &  strength  normal.Joints normal. Nail health  good. Vascular: Carotid, radial artery, dorsalis pedis and  posterior tibial pulses are full and equal. No bruits present. Neurologic: Alert and oriented x3. Deep tendon reflexes ssymmetrical ; 0-1/2 + L knee.          Skin: Intact without suspicious lesions . Mild dermatitis over chest. Lymph: No cervical, axillary lymphadenopathy present. Psych: Mood and affect are normal. Normally interactive                                                                                         Assessment & Plan:  #1 HTN controlled #2 heat rash #3 rhinitis #4 GERD #5 postural hypotension #55fequency & nocturia Plan: See orders and recommendations

## 2012-07-13 NOTE — Patient Instructions (Addendum)
Eat a low-fat diet with lots of fruits and vegetables, up to 7-9 servings per day. Consume less than 40 ( preferably ZERO) grams of sugar per day from foods & drinks with High Fructose Corn Syrup (HFCS) sugar as #1,2,3 or # 4 on label.Whole Foods, [employer] do not carry products with HFCS. Follow a  low carb nutrition program such as Media or The New Sugar Busters  to prevent Diabetes  Repeat the isometric exercises discussed 4- 5 times prior to standing if you've been seated for a period of time.  The triggers for dyspepsia or "heart burn"  include stress; the "aspirin family" ; alcohol; peppermint; and caffeine (coffee, tea, cola, and chocolate). The aspirin family would include aspirin and the nonsteroidal agents such as ibuprofen &  Naproxen. Tylenol would not cause reflux. If having dyspepsia ; food & drink should be avoided for @ least 2 hours before going to bed.  Plain Mucinex for thick secretions ;force NON dairy fluids . Use a Neti pot daily as needed for sinus congestion; going from open side to congested side . Nasal cleansing in the shower as discussed. Make sure that all residual soap is removed to prevent irritation. Fluticasone 1 spray in each nostril twice a day as needed. Use the "crossover" technique as discussed. Plain Allegra 160 daily as needed for itchy eyes & sneezing.  Use r Aveeno Daily  Moisturizing Lotion  twice a day  for the rash. Bathe with moisturizing liquid soap , not bar soap.   Please review the medication list in the After Visit Summary provided.Please write the name of the prescribing physician to the right of the medication and share this with all medical staff seen at each appointment. This will help provide continuity of care; help optimize therapeutic interventions;and help prevent drug:drug adverse reaction. Review and correct the record as indicated. Please share record with all medical staff seen.     Please  schedule fasting Labs in 3 mos  : BMET,Lipids, AST,ALT,  TSH.PLEASE BRING THESE INSTRUCTIONS TO FOLLOW UP  LAB APPOINTMENT.This will guarantee correct labs are drawn, eliminating need for repeat blood sampling ( needle sticks ! ). Diagnoses /Codes: 272.4,401.9,530.81.

## 2012-07-16 LAB — POCT URINALYSIS DIPSTICK
Bilirubin, UA: NEGATIVE
Blood, UA: NEGATIVE
Glucose, UA: NEGATIVE
Ketones, UA: NEGATIVE
Leukocytes, UA: NEGATIVE
Nitrite, UA: NEGATIVE
Protein, UA: NEGATIVE
Spec Grav, UA: >=1.03
Urobilinogen, UA: 0.2
pH, UA: 6

## 2012-07-23 ENCOUNTER — Ambulatory Visit (INDEPENDENT_AMBULATORY_CARE_PROVIDER_SITE_OTHER): Payer: Medicare Other | Admitting: Internal Medicine

## 2012-07-23 ENCOUNTER — Encounter: Payer: Self-pay | Admitting: Internal Medicine

## 2012-07-23 VITALS — BP 118/76 | HR 68 | Temp 98.0°F | Wt 240.0 lb

## 2012-07-23 DIAGNOSIS — R351 Nocturia: Secondary | ICD-10-CM | POA: Diagnosis not present

## 2012-07-23 DIAGNOSIS — K219 Gastro-esophageal reflux disease without esophagitis: Secondary | ICD-10-CM

## 2012-07-23 DIAGNOSIS — G4733 Obstructive sleep apnea (adult) (pediatric): Secondary | ICD-10-CM

## 2012-07-23 DIAGNOSIS — G40909 Epilepsy, unspecified, not intractable, without status epilepticus: Secondary | ICD-10-CM

## 2012-07-23 MED ORDER — OMEPRAZOLE 20 MG PO CPDR: 20.0000 mg | DELAYED_RELEASE_CAPSULE | Freq: Every day | ORAL | Status: DC

## 2012-07-23 NOTE — Progress Notes (Signed)
  Subjective:    Patient ID: Jeff Weaver, male    DOB: [birth year], 67 y.o.   MRN: [id number]  HPI He was hospitalized 1/10-1/15/14 at [facility] with a seizure. CT of the head without contrast revealed no acute intracranial abnormalities. 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 Chest x-ray showed central vascular congestion without pulmonary edema. Atelectasis was suggested in the right lower lobe. CBC and differential was normal except for minimally reduced platelet count 149,000.  He was discharged with the addition of Dilantin which is now being taken 100 mg in the morning and 200 mg in the evening.  A followup appointment with Dr.Meiden was to be scheduled. He has a standing appointment with Dr. Joesph July, Neurologist at [facility] 2/26    Review of Systems  He denies exertional chest pain, palpitations, dyspnea, or paroxysmal nocturnal dyspnea, or paroxysmal nocturnal dyspnea. He has had dyspepsia. He questions edema of abdomen & lower shin. He has nocturia every 1-2 hours, on average 5. He was placed on Flomax while at [facility] due to urinary retention and need for Foley cath  postop     Objective:   Physical Exam Gen.:  well-nourished in appearance. Alert, appropriate and cooperative throughout exam. Appears younger than stated age  Eyes: No corneal or conjunctival inflammation noted. No icterus Nose: External nasal exam reveals no deformity or inflammation. Nasal mucosa are pink and moist. No lesions or exudates noted.  Mouth: Oral mucosa and oropharynx reveal no lesions or exudates. Teeth in good repair. Neck: No deformities, masses, or tenderness noted. No NVD @ 15 degrees. Lungs: Normal respiratory effort; chest expands symmetrically. Lungs are clear to auscultation without rales, wheezes, or increased work of breathing. Heart: Normal rate and rhythm. Normal S1 and S2. No gallop, click, or rub. Grade 1/6 systolic murmur   Abdomen: Bowel sounds normal; abdomen  soft and nontender. No masses, organomegaly or hernias noted.Protuberant. No HJR                                  Musculoskeletal/extremities:  No clubbing, cyanosis,  or significant extremity  deformity noted. Range of motion normal .Tone & strength  normal.Joints  reveal minor DJD DIP changes. Nail health good. Able to lie down & sit up w/o help. Trace edema @ shins Vascular: Carotid, radial artery, dorsalis pedis and  posterior tibial pulses are full and equal. No bruits present. Neurologic: Alert and oriented x3.        Skin: Intact without suspicious lesions or rashes. Lymph: No cervical, axillary lymphadenopathy present. Psych: Mood and affect are normal. Normally interactive                                                                                         Assessment & Plan:  #1 seizure; status post addition of Dilantin. A decision will be made as to whether he'll be followed at [facility] or at [facility] by Dr.Meiden or in conjunction #2nocturia Plan : see orders; they will decide Urology referral location

## 2012-07-23 NOTE — Patient Instructions (Addendum)
Eat a low-fat diet with lots of fruits and vegetables, up to 7-9 servings per day. Consume less than 40 (preferably ZERO) grams of sugar per day from foods & drinks with High Fructose Corn Syrup (HFCS) sugar as #1,2,3 or # 4 on label.Whole Foods, [employer] do not carry products with HFCS. Follow a  low carb nutrition program such as Weed or The New Sugar Busters  to prevent Diabetes progression . White carbohydrates (potatoes, rice, bread, and pasta) have a high spike of sugar and a high load of sugar. For example a  baked potato has a cup of sugar and a  french fry  2 teaspoons of sugar. Yams, wild  rice, whole grained bread &  wheat pasta have been much lower spike and load of  sugar. Portions should be the size of a deck of cards or your palm.  The triggers for dyspepsia or "heart burn"  include stress; the "aspirin family" ; alcohol; peppermint; and caffeine (coffee, tea, cola, and chocolate). The aspirin family would include aspirin and the nonsteroidal agents such as ibuprofen &  Naproxen. Tylenol would not cause reflux. If having dyspepsia ; food & drink should be avoided for @ least 2 hours before going to bed.   Review and correct the record as indicated. Please share record with all medical staff seen.

## 2012-07-23 NOTE — Assessment & Plan Note (Signed)
Scheduled appt @ [facility]  08/29/12 with Dr Joesph July Appt in [facility] is to be scheduled with Lauraine Rinne MD,Neurology.

## 2012-08-01 ENCOUNTER — Encounter: Payer: Self-pay | Admitting: Internal Medicine

## 2012-08-01 DIAGNOSIS — R4701 Aphasia: Secondary | ICD-10-CM | POA: Diagnosis not present

## 2012-08-01 DIAGNOSIS — I6200 Nontraumatic subdural hemorrhage, unspecified: Secondary | ICD-10-CM | POA: Diagnosis not present

## 2012-08-01 DIAGNOSIS — G40109 Localization-related (focal) (partial) symptomatic epilepsy and epileptic syndromes with simple partial seizures, not intractable, without status epilepticus: Secondary | ICD-10-CM | POA: Diagnosis not present

## 2012-08-01 DIAGNOSIS — Z982 Presence of cerebrospinal fluid drainage device: Secondary | ICD-10-CM | POA: Diagnosis not present

## 2012-08-09 DIAGNOSIS — R569 Unspecified convulsions: Secondary | ICD-10-CM | POA: Diagnosis not present

## 2012-08-15 DIAGNOSIS — R4701 Aphasia: Secondary | ICD-10-CM | POA: Diagnosis not present

## 2012-08-15 DIAGNOSIS — R300 Dysuria: Secondary | ICD-10-CM | POA: Diagnosis not present

## 2012-08-15 DIAGNOSIS — G40109 Localization-related (focal) (partial) symptomatic epilepsy and epileptic syndromes with simple partial seizures, not intractable, without status epilepticus: Secondary | ICD-10-CM | POA: Diagnosis not present

## 2012-08-20 DIAGNOSIS — R569 Unspecified convulsions: Secondary | ICD-10-CM | POA: Diagnosis not present

## 2012-08-22 DIAGNOSIS — R569 Unspecified convulsions: Secondary | ICD-10-CM | POA: Diagnosis not present

## 2012-08-22 DIAGNOSIS — G4733 Obstructive sleep apnea (adult) (pediatric): Secondary | ICD-10-CM | POA: Diagnosis not present

## 2012-09-12 DIAGNOSIS — N319 Neuromuscular dysfunction of bladder, unspecified: Secondary | ICD-10-CM | POA: Diagnosis not present

## 2012-09-12 DIAGNOSIS — R569 Unspecified convulsions: Secondary | ICD-10-CM | POA: Diagnosis not present

## 2012-09-12 DIAGNOSIS — I671 Cerebral aneurysm, nonruptured: Secondary | ICD-10-CM | POA: Diagnosis not present

## 2012-09-12 DIAGNOSIS — G911 Obstructive hydrocephalus: Secondary | ICD-10-CM | POA: Diagnosis not present

## 2012-09-19 ENCOUNTER — Ambulatory Visit (INDEPENDENT_AMBULATORY_CARE_PROVIDER_SITE_OTHER): Payer: Medicare Other | Admitting: Internal Medicine

## 2012-09-19 ENCOUNTER — Encounter: Payer: Self-pay | Admitting: Internal Medicine

## 2012-09-19 VITALS — BP 128/84 | HR 66 | Temp 98.4°F | Wt 244.0 lb

## 2012-09-19 DIAGNOSIS — E785 Hyperlipidemia, unspecified: Secondary | ICD-10-CM | POA: Diagnosis not present

## 2012-09-19 DIAGNOSIS — N139 Obstructive and reflux uropathy, unspecified: Secondary | ICD-10-CM

## 2012-09-19 DIAGNOSIS — R635 Abnormal weight gain: Secondary | ICD-10-CM

## 2012-09-19 DIAGNOSIS — I10 Essential (primary) hypertension: Secondary | ICD-10-CM

## 2012-09-19 LAB — LIPID PANEL
Cholesterol: 161 mg/dL (ref 0–200)
HDL: 42.4 mg/dL (ref >39.00)
Total CHOL/HDL Ratio: 4
Triglycerides: 210 mg/dL — ABNORMAL HIGH (ref 0.0–149.0)
VLDL: 42 mg/dL — ABNORMAL HIGH (ref 0.0–40.0)

## 2012-09-19 LAB — ALT: ALT: 43 U/L (ref 0–53)

## 2012-09-19 LAB — AST: AST: 28 U/L (ref 0–37)

## 2012-09-19 LAB — T4, FREE: Free T4: 1.04 ng/dL (ref 0.60–1.60)

## 2012-09-19 LAB — BASIC METABOLIC PANEL
BUN: 13 mg/dL (ref 6–23)
CO2: 29 mEq/L (ref 19–32)
Calcium: 9.5 mg/dL (ref 8.4–10.5)
Chloride: 98 mEq/L (ref 96–112)
Creatinine, Ser: 0.9 mg/dL (ref 0.4–1.5)
GFR: 91.44 mL/min (ref >60.00)
Glucose, Bld: 110 mg/dL — ABNORMAL HIGH (ref 70–99)
Potassium: 4 mEq/L (ref 3.5–5.1)
Sodium: 137 mEq/L (ref 135–145)

## 2012-09-19 LAB — CK: Total CK: 114 U/L (ref 7–232)

## 2012-09-19 LAB — TSH: TSH: 1.35 u[IU]/mL (ref 0.35–5.50)

## 2012-09-19 LAB — LDL CHOLESTEROL, DIRECT: Direct LDL: 87.6 mg/dL

## 2012-09-19 NOTE — Patient Instructions (Addendum)
Review and correct the record as indicated. Please share record with all medical staff seen.

## 2012-09-19 NOTE — Addendum Note (Signed)
Addended byUnice Cobble F on: 09/19/2012 12:05 PM   Modules accepted: Orders

## 2012-09-19 NOTE — Progress Notes (Signed)
  Subjective:    Patient ID: Jeff Weaver, male    DOB: [birth year], 67 y.o.   MRN: [id number]  HPI He has weight has climbed up only 40 pounds since he was discharged from the hospital. He is on no specific diet. Appropriately neurosurgical service has recommended checking his thyroid status.  BP averages 130/80 since 10/13.    Review of Systems  He was placed on tamsulosin while hospitalized on the neurosurgical service. He had an indwelling Foley catheter replaced on several occasions. He has a followup scheduled with urology to see if this needs to be continued. At this time he denies any difficulty starting his stream or stopping the stream. He describes some incomplete voiding. He has nocturia up to 6 times per night.     Objective:   Physical Exam   He appears well-nourished in no distress. Weight gain is central  Has no lymphadenopathy about the neck or axilla.  Thyroid normal to palpation.  Chest is clear with no abnormal breath sounds  He has an S4 with slurring.  Fine tremor of the extremities is present. There is no onycholysis noted.  Deep tendon reflexes are equal and normal        Assessment & Plan:  #1 hypertension well-controlled  #2 lower urinary tract symptoms with incomplete voiding and nocturia despite tamsulosin.  #3 weight gain 40 pounds  Plan: See orders  & recommendations

## 2012-09-27 DIAGNOSIS — L718 Other rosacea: Secondary | ICD-10-CM | POA: Insufficient documentation

## 2012-09-27 DIAGNOSIS — H019 Unspecified inflammation of eyelid: Secondary | ICD-10-CM | POA: Insufficient documentation

## 2012-09-27 DIAGNOSIS — H2510 Age-related nuclear cataract, unspecified eye: Secondary | ICD-10-CM | POA: Diagnosis not present

## 2012-09-27 DIAGNOSIS — L719 Rosacea, unspecified: Secondary | ICD-10-CM | POA: Diagnosis not present

## 2012-09-27 DIAGNOSIS — H5200 Hypermetropia, unspecified eye: Secondary | ICD-10-CM | POA: Diagnosis not present

## 2012-09-27 DIAGNOSIS — H52209 Unspecified astigmatism, unspecified eye: Secondary | ICD-10-CM | POA: Diagnosis not present

## 2012-09-27 DIAGNOSIS — H02009 Unspecified entropion of unspecified eye, unspecified eyelid: Secondary | ICD-10-CM | POA: Diagnosis not present

## 2012-09-27 DIAGNOSIS — H01006 Unspecified blepharitis left eye, unspecified eyelid: Secondary | ICD-10-CM | POA: Insufficient documentation

## 2012-09-27 DIAGNOSIS — H01003 Unspecified blepharitis right eye, unspecified eyelid: Secondary | ICD-10-CM | POA: Insufficient documentation

## 2012-09-27 DIAGNOSIS — H01009 Unspecified blepharitis unspecified eye, unspecified eyelid: Secondary | ICD-10-CM | POA: Diagnosis not present

## 2012-09-27 DIAGNOSIS — H524 Presbyopia: Secondary | ICD-10-CM | POA: Diagnosis not present

## 2012-10-14 ENCOUNTER — Other Ambulatory Visit: Payer: Self-pay | Admitting: Internal Medicine

## 2012-10-16 ENCOUNTER — Other Ambulatory Visit: Payer: Self-pay | Admitting: Internal Medicine

## 2012-10-18 DIAGNOSIS — Z9889 Other specified postprocedural states: Secondary | ICD-10-CM

## 2012-10-18 DIAGNOSIS — Z8679 Personal history of other diseases of the circulatory system: Secondary | ICD-10-CM | POA: Insufficient documentation

## 2012-10-18 DIAGNOSIS — Z8673 Personal history of transient ischemic attack (TIA), and cerebral infarction without residual deficits: Secondary | ICD-10-CM | POA: Insufficient documentation

## 2012-10-19 DIAGNOSIS — Z8679 Personal history of other diseases of the circulatory system: Secondary | ICD-10-CM | POA: Diagnosis not present

## 2012-10-19 DIAGNOSIS — Z982 Presence of cerebrospinal fluid drainage device: Secondary | ICD-10-CM | POA: Diagnosis not present

## 2012-10-19 DIAGNOSIS — Z9889 Other specified postprocedural states: Secondary | ICD-10-CM | POA: Diagnosis not present

## 2012-10-19 DIAGNOSIS — R42 Dizziness and giddiness: Secondary | ICD-10-CM | POA: Diagnosis not present

## 2012-10-19 DIAGNOSIS — R29818 Other symptoms and signs involving the nervous system: Secondary | ICD-10-CM | POA: Diagnosis not present

## 2012-10-19 DIAGNOSIS — I671 Cerebral aneurysm, nonruptured: Secondary | ICD-10-CM | POA: Diagnosis not present

## 2012-10-29 DIAGNOSIS — H169 Unspecified keratitis: Secondary | ICD-10-CM | POA: Diagnosis not present

## 2012-10-29 DIAGNOSIS — H02039 Senile entropion of unspecified eye, unspecified eyelid: Secondary | ICD-10-CM | POA: Diagnosis not present

## 2012-10-29 DIAGNOSIS — H168 Other keratitis: Secondary | ICD-10-CM | POA: Diagnosis not present

## 2012-10-29 DIAGNOSIS — E785 Hyperlipidemia, unspecified: Secondary | ICD-10-CM | POA: Diagnosis not present

## 2012-10-29 DIAGNOSIS — Z8673 Personal history of transient ischemic attack (TIA), and cerebral infarction without residual deficits: Secondary | ICD-10-CM | POA: Diagnosis not present

## 2012-10-29 DIAGNOSIS — G4733 Obstructive sleep apnea (adult) (pediatric): Secondary | ICD-10-CM | POA: Diagnosis not present

## 2012-10-29 DIAGNOSIS — I10 Essential (primary) hypertension: Secondary | ICD-10-CM | POA: Diagnosis not present

## 2012-10-29 DIAGNOSIS — H02009 Unspecified entropion of unspecified eye, unspecified eyelid: Secondary | ICD-10-CM | POA: Diagnosis not present

## 2012-10-31 DIAGNOSIS — N400 Enlarged prostate without lower urinary tract symptoms: Secondary | ICD-10-CM | POA: Diagnosis not present

## 2012-10-31 DIAGNOSIS — R350 Frequency of micturition: Secondary | ICD-10-CM | POA: Insufficient documentation

## 2012-10-31 DIAGNOSIS — N319 Neuromuscular dysfunction of bladder, unspecified: Secondary | ICD-10-CM | POA: Diagnosis not present

## 2012-10-31 DIAGNOSIS — R351 Nocturia: Secondary | ICD-10-CM | POA: Diagnosis not present

## 2012-12-03 ENCOUNTER — Encounter: Payer: Self-pay | Admitting: Internal Medicine

## 2012-12-03 ENCOUNTER — Ambulatory Visit (INDEPENDENT_AMBULATORY_CARE_PROVIDER_SITE_OTHER): Payer: Medicare Other | Admitting: Internal Medicine

## 2012-12-03 ENCOUNTER — Ambulatory Visit (HOSPITAL_BASED_OUTPATIENT_CLINIC_OR_DEPARTMENT_OTHER)
Admission: RE | Admit: 2012-12-03 | Discharge: 2012-12-03 | Disposition: A | Discharge status: Home / Self Care | Payer: Medicare Other | Source: Ambulatory Visit | Attending: Internal Medicine | Admitting: Internal Medicine

## 2012-12-03 VITALS — BP 118/76 | HR 63 | Temp 98.2°F | Wt 245.0 lb

## 2012-12-03 DIAGNOSIS — R079 Chest pain, unspecified: Secondary | ICD-10-CM | POA: Diagnosis not present

## 2012-12-03 DIAGNOSIS — R0609 Other forms of dyspnea: Secondary | ICD-10-CM | POA: Diagnosis not present

## 2012-12-03 DIAGNOSIS — R0989 Other specified symptoms and signs involving the circulatory and respiratory systems: Secondary | ICD-10-CM | POA: Diagnosis not present

## 2012-12-03 DIAGNOSIS — R059 Cough, unspecified: Secondary | ICD-10-CM

## 2012-12-03 DIAGNOSIS — R05 Cough: Secondary | ICD-10-CM | POA: Diagnosis not present

## 2012-12-03 DIAGNOSIS — R5381 Other malaise: Secondary | ICD-10-CM | POA: Diagnosis not present

## 2012-12-03 DIAGNOSIS — I499 Cardiac arrhythmia, unspecified: Secondary | ICD-10-CM | POA: Insufficient documentation

## 2012-12-03 DIAGNOSIS — R072 Precordial pain: Secondary | ICD-10-CM | POA: Diagnosis not present

## 2012-12-03 DIAGNOSIS — R5383 Other fatigue: Secondary | ICD-10-CM | POA: Diagnosis not present

## 2012-12-03 DIAGNOSIS — R0600 Dyspnea, unspecified: Secondary | ICD-10-CM

## 2012-12-03 DIAGNOSIS — G4733 Obstructive sleep apnea (adult) (pediatric): Secondary | ICD-10-CM

## 2012-12-03 DIAGNOSIS — J984 Other disorders of lung: Secondary | ICD-10-CM | POA: Diagnosis not present

## 2012-12-03 DIAGNOSIS — IMO0001 Reserved for inherently not codable concepts without codable children: Secondary | ICD-10-CM

## 2012-12-03 LAB — TROPONIN I: Troponin I: 0.01 ng/mL (ref <0.06)

## 2012-12-03 LAB — D-DIMER, QUANTITATIVE: D-Dimer, Quant: 0.78 ug/mL-FEU — ABNORMAL HIGH (ref 0.00–0.48)

## 2012-12-03 NOTE — Patient Instructions (Addendum)
Order for x-rays entered into  the computer; these will be performed at [facility]. No appointment is necessary.  Reflux of gastric acid may be asymptomatic as this may occur mainly during sleep.The triggers for reflux  include stress; the "aspirin family" ; alcohol; peppermint; and caffeine (coffee, tea, cola, and chocolate). The aspirin family would include aspirin and the nonsteroidal agents such as ibuprofen &  Naproxen. Tylenol would not cause reflux. If having symptoms ; food & drink should be avoided for @ least 2 hours before going to bed. Take Prilosec 30 minutes pre breakfast, not in afternoon as you have been doing.  Do not take the pravastatin until we receive the results of your lab studies.

## 2012-12-03 NOTE — Progress Notes (Signed)
Subjective:    Patient ID: Jeff Weaver, male    DOB: May 31, 1946, 67 y.o.   MRN: [id number]  HPI He is here with several concerns. Listed as most important is shortness of breath or dyspnea which began 2 weeks ago. It is described as if he were "getting one half of a full breath". There was no definite trigger or environmental exposure for the symptoms. It has been stable during this period time.  It is occasionally associated with a NP cough ( see # 3) and malaise. He questions whether Fluticasone nasal spray has resulted in some improvement in the dyspnea. It is possible he was  exposed to an individual with pneumonia in the hospital when he was visiting a friend.   The second concern is pain in the legs described as soreness in the thighs. This is worse with exercise such as walking. Again this is stable. He does have some discomfort in the thighs when he tries to rise from a sitting position. Rest has been of some benefit.He has a history of Pravastatin , but it as started @ [facility]. He denies hip or shoulder pain with the thigh discomfort  His third concern is the cough which began the third week of May. As noted there is no sputum production or hemoptysis. he feels that the dyspnea and the cough are 2 separate issues.The cough was treated with hard candy with response.   The fourth concern is chest pain which has been intermittent for the last few months. It  has not changed character during that period time. This is nonexertional and located in the epigastric area and described as as if he had "eaten too many pieces of pizza." These symptoms they have responded to the omeprazole which he restarted 2 weeks ago.         Review of Systems  He does describe some sneezing and morning nasal congestion without purulence. He does not have frontal headache or facial pain. He's had some itchy, watery eyes which he relates to recent surgery.  He denies significant dyspepsia , dysphagia, melena or  rectal bleeding. He has had malaise and some alteration in appetite without associated weight loss.  His sleep apnea equipment is provided by the [place]. The monitoring card is not in the equipment at this time     Objective:   Physical Exam General appearance:well nourished; no acute distress or increased work of breathing is present.  Weight excess.No  lymphadenopathy about the head, neck, or axilla noted.   Eyes: No conjunctival inflammation or lid edema is present, but post op changes OD (surgery for lower lid inversion 10/29/12 @ [facility]) . There is no scleral icterus.  Ears:  Hearing aids bilaterally  Nose:  External nasal examination shows no deformity or inflammation. Nasal mucosa are pink and moist without lesions or exudates. No septal dislocation or deviation.No obstruction to airflow.   Oral exam: Dental hygiene is good; lips and gums are healthy appearing.There is no oropharyngeal erythema or exudate noted.   Neck:  No deformities, thyromegaly, masses, or tenderness noted.    Heart:  Normal rate and regular rhythm. S1 and S2 normal without gallop, murmur, click, rub or other extra sounds.   Lungs:Chest clear to auscultation; no wheezes, rhonchi,rales ,or rubs present.No increased work of breathing.    Extremities:  No cyanosis, edema, or clubbing  noted . Neg Homan's  Abdomen: No organomegaly, masses, or tenderness. Protuberant   Skin: Warm & dry w/o jaundice or tenting.  Assessment  #2   #2 & Plan:#2  #2 leg pain  #1 dyspnea #2 leg pain #3 cough #4 atypical chest pain #26ftigue #6 sleep apnea Plan: see Orders. If all studies are negative [facility]

## 2012-12-04 LAB — BASIC METABOLIC PANEL
BUN: 10 mg/dL (ref 6–23)
CO2: 31 mEq/L (ref 19–32)
Calcium: 9.1 mg/dL (ref 8.4–10.5)
Chloride: 97 mEq/L (ref 96–112)
Creatinine, Ser: 0.9 mg/dL (ref 0.4–1.5)
GFR: 92.6 mL/min (ref >60.00)
Glucose, Bld: 88 mg/dL (ref 70–99)
Potassium: 3.4 mEq/L — ABNORMAL LOW (ref 3.5–5.1)
Sodium: 136 mEq/L (ref 135–145)

## 2012-12-04 LAB — CBC WITH DIFFERENTIAL/PLATELET
Basophils Absolute: 0 10*3/uL (ref 0.0–0.1)
Basophils Relative: 0.3 % (ref 0.0–3.0)
Eosinophils Absolute: 0.1 10*3/uL (ref 0.0–0.7)
Eosinophils Relative: 1.3 % (ref 0.0–5.0)
HCT: 40.8 % (ref 39.0–52.0)
Hemoglobin: 13.8 g/dL (ref 13.0–17.0)
Lymphocytes Relative: 21.7 % (ref 12.0–46.0)
Lymphs Abs: 1.9 10*3/uL (ref 0.7–4.0)
MCHC: 33.8 g/dL (ref 30.0–36.0)
MCV: 91.5 fl (ref 78.0–100.0)
Monocytes Absolute: 0.7 10*3/uL (ref 0.1–1.0)
Monocytes Relative: 7.7 % (ref 3.0–12.0)
Neutro Abs: 6 10*3/uL (ref 1.4–7.7)
Neutrophils Relative %: 69 % (ref 43.0–77.0)
Platelets: 268 10*3/uL (ref 150.0–400.0)
RBC: 4.46 Mil/uL (ref 4.22–5.81)
RDW: 13.2 % (ref 11.5–14.6)
WBC: 8.7 10*3/uL (ref 4.5–10.5)

## 2012-12-04 LAB — CK: Total CK: 79 U/L (ref 7–232)

## 2012-12-04 LAB — TSH: TSH: 0.66 u[IU]/mL (ref 0.35–5.50)

## 2012-12-05 DIAGNOSIS — N400 Enlarged prostate without lower urinary tract symptoms: Secondary | ICD-10-CM | POA: Diagnosis not present

## 2012-12-05 DIAGNOSIS — R351 Nocturia: Secondary | ICD-10-CM | POA: Diagnosis not present

## 2012-12-05 DIAGNOSIS — R350 Frequency of micturition: Secondary | ICD-10-CM | POA: Diagnosis not present

## 2012-12-06 DIAGNOSIS — R351 Nocturia: Secondary | ICD-10-CM | POA: Diagnosis not present

## 2012-12-06 DIAGNOSIS — R350 Frequency of micturition: Secondary | ICD-10-CM | POA: Diagnosis not present

## 2012-12-06 DIAGNOSIS — R319 Hematuria, unspecified: Secondary | ICD-10-CM | POA: Diagnosis not present

## 2012-12-18 ENCOUNTER — Telehealth: Payer: Self-pay | Admitting: Internal Medicine

## 2012-12-18 NOTE — Telephone Encounter (Signed)
Yes ,resume

## 2012-12-18 NOTE — Telephone Encounter (Signed)
Patient's spouse called to find out if the patient should restart his pravastatin. She states he was told to hold it until lab results were back.

## 2012-12-18 NOTE — Telephone Encounter (Signed)
Left Pt detail VM ok to retart med

## 2012-12-24 DIAGNOSIS — G4733 Obstructive sleep apnea (adult) (pediatric): Secondary | ICD-10-CM | POA: Diagnosis not present

## 2012-12-24 DIAGNOSIS — R0609 Other forms of dyspnea: Secondary | ICD-10-CM | POA: Diagnosis not present

## 2012-12-24 DIAGNOSIS — I10 Essential (primary) hypertension: Secondary | ICD-10-CM | POA: Diagnosis not present

## 2012-12-24 DIAGNOSIS — R0989 Other specified symptoms and signs involving the circulatory and respiratory systems: Secondary | ICD-10-CM | POA: Diagnosis not present

## 2012-12-24 DIAGNOSIS — I671 Cerebral aneurysm, nonruptured: Secondary | ICD-10-CM | POA: Diagnosis not present

## 2012-12-24 DIAGNOSIS — G4730 Sleep apnea, unspecified: Secondary | ICD-10-CM | POA: Diagnosis not present

## 2012-12-24 DIAGNOSIS — E785 Hyperlipidemia, unspecified: Secondary | ICD-10-CM | POA: Diagnosis not present

## 2012-12-24 DIAGNOSIS — H01009 Unspecified blepharitis unspecified eye, unspecified eyelid: Secondary | ICD-10-CM | POA: Diagnosis not present

## 2012-12-25 DIAGNOSIS — H02009 Unspecified entropion of unspecified eye, unspecified eyelid: Secondary | ICD-10-CM | POA: Diagnosis not present

## 2012-12-30 DIAGNOSIS — E7800 Pure hypercholesterolemia, unspecified: Secondary | ICD-10-CM | POA: Diagnosis not present

## 2012-12-30 DIAGNOSIS — N400 Enlarged prostate without lower urinary tract symptoms: Secondary | ICD-10-CM | POA: Diagnosis not present

## 2012-12-30 DIAGNOSIS — R5381 Other malaise: Secondary | ICD-10-CM | POA: Diagnosis not present

## 2012-12-30 DIAGNOSIS — I6789 Other cerebrovascular disease: Secondary | ICD-10-CM | POA: Diagnosis not present

## 2012-12-30 DIAGNOSIS — Z79899 Other long term (current) drug therapy: Secondary | ICD-10-CM | POA: Diagnosis not present

## 2012-12-30 DIAGNOSIS — E663 Overweight: Secondary | ICD-10-CM | POA: Diagnosis not present

## 2012-12-30 DIAGNOSIS — G9389 Other specified disorders of brain: Secondary | ICD-10-CM | POA: Diagnosis not present

## 2012-12-30 DIAGNOSIS — R569 Unspecified convulsions: Secondary | ICD-10-CM | POA: Diagnosis not present

## 2012-12-30 DIAGNOSIS — Z87891 Personal history of nicotine dependence: Secondary | ICD-10-CM | POA: Diagnosis not present

## 2012-12-30 DIAGNOSIS — I10 Essential (primary) hypertension: Secondary | ICD-10-CM | POA: Diagnosis not present

## 2012-12-30 DIAGNOSIS — Z6836 Body mass index (BMI) 36.0-36.9, adult: Secondary | ICD-10-CM | POA: Diagnosis not present

## 2012-12-30 DIAGNOSIS — M069 Rheumatoid arthritis, unspecified: Secondary | ICD-10-CM | POA: Diagnosis not present

## 2012-12-30 DIAGNOSIS — R799 Abnormal finding of blood chemistry, unspecified: Secondary | ICD-10-CM | POA: Diagnosis not present

## 2012-12-30 DIAGNOSIS — E785 Hyperlipidemia, unspecified: Secondary | ICD-10-CM | POA: Diagnosis not present

## 2012-12-30 DIAGNOSIS — I498 Other specified cardiac arrhythmias: Secondary | ICD-10-CM | POA: Diagnosis not present

## 2012-12-30 DIAGNOSIS — R0602 Shortness of breath: Secondary | ICD-10-CM | POA: Diagnosis not present

## 2012-12-30 DIAGNOSIS — Z982 Presence of cerebrospinal fluid drainage device: Secondary | ICD-10-CM | POA: Diagnosis not present

## 2012-12-30 DIAGNOSIS — Z8673 Personal history of transient ischemic attack (TIA), and cerebral infarction without residual deficits: Secondary | ICD-10-CM | POA: Diagnosis not present

## 2012-12-30 DIAGNOSIS — R4182 Altered mental status, unspecified: Secondary | ICD-10-CM | POA: Diagnosis not present

## 2012-12-30 DIAGNOSIS — G40909 Epilepsy, unspecified, not intractable, without status epilepticus: Secondary | ICD-10-CM | POA: Diagnosis not present

## 2012-12-30 DIAGNOSIS — R5383 Other fatigue: Secondary | ICD-10-CM | POA: Diagnosis not present

## 2012-12-31 DIAGNOSIS — R4182 Altered mental status, unspecified: Secondary | ICD-10-CM | POA: Diagnosis not present

## 2012-12-31 DIAGNOSIS — E785 Hyperlipidemia, unspecified: Secondary | ICD-10-CM | POA: Diagnosis not present

## 2012-12-31 DIAGNOSIS — N400 Enlarged prostate without lower urinary tract symptoms: Secondary | ICD-10-CM | POA: Diagnosis not present

## 2012-12-31 DIAGNOSIS — R569 Unspecified convulsions: Secondary | ICD-10-CM | POA: Diagnosis not present

## 2012-12-31 DIAGNOSIS — I10 Essential (primary) hypertension: Secondary | ICD-10-CM | POA: Diagnosis not present

## 2013-01-01 DIAGNOSIS — I10 Essential (primary) hypertension: Secondary | ICD-10-CM | POA: Diagnosis not present

## 2013-01-01 DIAGNOSIS — E785 Hyperlipidemia, unspecified: Secondary | ICD-10-CM | POA: Diagnosis not present

## 2013-01-01 DIAGNOSIS — N400 Enlarged prostate without lower urinary tract symptoms: Secondary | ICD-10-CM | POA: Diagnosis not present

## 2013-01-01 DIAGNOSIS — R569 Unspecified convulsions: Secondary | ICD-10-CM | POA: Diagnosis not present

## 2013-01-07 DIAGNOSIS — H2589 Other age-related cataract: Secondary | ICD-10-CM | POA: Diagnosis not present

## 2013-01-07 DIAGNOSIS — H5200 Hypermetropia, unspecified eye: Secondary | ICD-10-CM | POA: Diagnosis not present

## 2013-01-07 DIAGNOSIS — H52209 Unspecified astigmatism, unspecified eye: Secondary | ICD-10-CM | POA: Diagnosis not present

## 2013-01-07 DIAGNOSIS — H524 Presbyopia: Secondary | ICD-10-CM | POA: Diagnosis not present

## 2013-01-08 ENCOUNTER — Encounter: Payer: Self-pay | Admitting: Internal Medicine

## 2013-01-08 ENCOUNTER — Ambulatory Visit (INDEPENDENT_AMBULATORY_CARE_PROVIDER_SITE_OTHER): Payer: Medicare Other | Admitting: Internal Medicine

## 2013-01-08 VITALS — BP 114/70 | HR 62 | Temp 97.8°F | Wt 248.0 lb

## 2013-01-08 DIAGNOSIS — R1013 Epigastric pain: Secondary | ICD-10-CM | POA: Diagnosis not present

## 2013-01-08 DIAGNOSIS — R609 Edema, unspecified: Secondary | ICD-10-CM | POA: Diagnosis not present

## 2013-01-08 DIAGNOSIS — I10 Essential (primary) hypertension: Secondary | ICD-10-CM | POA: Diagnosis not present

## 2013-01-08 NOTE — Progress Notes (Signed)
Subjective:    Patient ID: Jeff Weaver, male    DOB: 1946/06/19, 66 y.o.   MRN: [id number]  HPI   He is here in followup after his evaluation of seizures 12/30/12 at [facility] over 48 hrs. There's been no recurrence of seizures since discharge.  Pt to see his   neurologist- Ansel Bong, NP at [facility] 01/14/13  While at [facility], pt was told that norvasc may be causing slow heart rate.  Pt has been keeping a record of HR at home, ranging from 60s to 70s.  HR 62 in the office today.  His major concern is weight gain. They question whether this might be related to any medications. His TSH was 0.66 on  12/03/2012.  Pt relates that he has had 40 lb weight gain since hospitalization Sept. 2013.  Admits to significantly less activity since that admission, but states he consumes less food than he used.  Pt used to walk 1.5 hours 5x per week, now walks 3 days per week, 40 minutes. at a time (wife points out that this is at a leisurely pace).  24 hour diet recalls shows ensure for breakfast, [employer] ribs with baked apples, and french fries, and water.  Dinner- hummus plate with fresh vegetables and water.  Admits to eating 2-3 times per week.    Review of Systems  Other issues listed and included swelling of the legs and chest pain. However exploration of these issues revealed no significant problem.  The chest pain was positional and epigastric area when he would slump over. No anginal symptoms are present. He denies no significant dysphagia, melena, or rectal bleeding.  He really does not describe edema as much as generalized weight gain. This issue has been a concern for a decade.  There was some concern about the calcium channel blocker slowing heart rate.  A sleep study has been completed at [facility]; results are pending     Objective:   Physical Exam  Gen.:  well-nourished in appearance; weight excess. Alert, appropriate and cooperative  throughout exam.Head: Normocephalic without obvious abnormalities  Eyes: No corneal or conjunctival inflammation noted. No lid lag or proptosis. Extraocular motion intact. . Nose: External nasal exam reveals no deformity or inflammation. Nasal mucosa are pink and moist. No lesions or exudates noted.  Mouth: Oral mucosa and oropharynx reveal no lesions or exudates. Teeth in good repair. Neck: No deformities, masses, or tenderness noted.  Thyroid normal. Lungs: Normal respiratory effort; chest expands symmetrically. Lungs are clear to auscultation without rales, wheezes, or increased work of breathing. Heart: Normal rate and rhythm. Normal S1 and S2. No gallop, click, or rub. No murmur. Abdomen:Protuberant. Bowel sounds normal; abdomen soft and nontender. No masses, organomegaly or hernias noted.                       Musculoskeletal/extremities: No clubbing, cyanosis or significant extremity  deformity noted. Trace edema only @ sock line .Tone & strength  Normal. Joints normal. Nail health good. Vascular: Carotid, radial artery, dorsalis pedis and  posterior tibial pulses are full and equal. No bruits present. Neurologic: Alert and oriented x3. Deep tendon reflexes symmetrical but 0-1/2+         Skin: Intact without suspicious lesions or rashes. Lymph: No cervical, axillary lymphadenopathy present. Psych: Mood and affect : easily frustrated; but  normally interactive  Assessment & Plan:  #1See Current Assessment & Plan in Problem List under specific Diagnosis

## 2013-01-08 NOTE — Patient Instructions (Addendum)
Decrease amlodipine to 2.5 mg daily and monitor blood pressure.Minimal Blood Pressure Goal= AVERAGE < 140/90;  Ideal is an AVERAGE < 135/85. This AVERAGE should be calculated from @ least 5-7 BP readings taken @ different times of day on different days of week. You should not respond to isolated BP readings , but rather the AVERAGE for that week .Please bring your  blood pressure cuff to office visits to verify that it is reliable.It  can also be checked against the blood pressure device at the pharmacy.  Discuss with the specialist at [facility] as to whether any of the central nervous system agents can cause weight gain.  Please have a copy of your sleep study forwarded for inclusion in the record.  Recheck TSH in 3-4 months. (Code 794.5)

## 2013-01-08 NOTE — Assessment & Plan Note (Signed)
They will be given an option of taking amlodipine once daily only. If blood pressure remains less than 135 /85 on average the second dose can be discontinued

## 2013-01-14 DIAGNOSIS — R635 Abnormal weight gain: Secondary | ICD-10-CM | POA: Diagnosis not present

## 2013-01-14 DIAGNOSIS — G4733 Obstructive sleep apnea (adult) (pediatric): Secondary | ICD-10-CM | POA: Diagnosis not present

## 2013-01-14 DIAGNOSIS — R569 Unspecified convulsions: Secondary | ICD-10-CM | POA: Diagnosis not present

## 2013-01-16 ENCOUNTER — Other Ambulatory Visit: Payer: Self-pay | Admitting: *Deleted

## 2013-01-16 DIAGNOSIS — J310 Chronic rhinitis: Secondary | ICD-10-CM

## 2013-01-16 MED ORDER — FLUTICASONE PROPIONATE 50 MCG/ACT NA SUSP: 1.0000 | Freq: Two times a day (BID) | NASAL | Status: DC | PRN

## 2013-01-16 NOTE — Telephone Encounter (Signed)
Refilled Rx. 01/16/13 flonase nasal

## 2013-01-18 DIAGNOSIS — R350 Frequency of micturition: Secondary | ICD-10-CM | POA: Diagnosis not present

## 2013-01-22 ENCOUNTER — Encounter: Payer: Self-pay | Admitting: Internal Medicine

## 2013-01-23 ENCOUNTER — Encounter: Payer: Self-pay | Admitting: *Deleted

## 2013-01-24 ENCOUNTER — Ambulatory Visit (INDEPENDENT_AMBULATORY_CARE_PROVIDER_SITE_OTHER): Payer: Medicare Other | Admitting: Internal Medicine

## 2013-01-24 ENCOUNTER — Encounter: Payer: Self-pay | Admitting: Internal Medicine

## 2013-01-24 VITALS — BP 110/66 | HR 66 | Temp 98.7°F | Wt 251.0 lb

## 2013-01-24 DIAGNOSIS — R05 Cough: Secondary | ICD-10-CM

## 2013-01-24 DIAGNOSIS — R059 Cough, unspecified: Secondary | ICD-10-CM

## 2013-01-24 DIAGNOSIS — J309 Allergic rhinitis, unspecified: Secondary | ICD-10-CM

## 2013-01-24 MED ORDER — MONTELUKAST SODIUM 10 MG PO TABS: 10.0000 mg | ORAL_TABLET | Freq: Every day | ORAL | Status: DC

## 2013-01-24 NOTE — Patient Instructions (Addendum)
Plain Mucinex (NOT D) for thick secretions ;force NON dairy fluids .   Nasal cleansing in the shower as discussed with lather of mild shampoo.After 10 seconds wash off lather while  exhaling through nostrils. Make sure that all residual soap is removed to prevent irritation.  Fluticasone 1 spray in each nostril twice a day as needed. Use the "crossover" technique into opposite nostril spraying toward opposite ear @ 45 degree angle, not straight up into nostril.  Use a Neti pot daily only  as needed for significant sinus congestion; going from open side to congested side . Plain Allegra (NOT D )  160 daily , Loratidine 10 mg , OR Zyrtec 10 mg @ bedtime  as needed for itchy eyes & sneezing. Fill Rx for generic Singulair if symptoms persist Zicam Melts or Zinc lozenges as per package label for scratchy throat . Complementary options include  vitamin C 2000 mg daily; & Echinacea for 4-7 days. Report persistent or progressive fever; discolored nasal or chest secretions; or frontal headache or facial  pain.

## 2013-01-24 NOTE — Progress Notes (Signed)
  Subjective:    Patient ID: Jeff Weaver, male    DOB: [birth year], 67 y.o.   MRN: [id number]  HPI  Symptoms began 01/16/13 as rhinitis followed by nonproductive cough. He questions some intermittent mild wheezing.  He's noted a hoarseness of his voice and slight sore throat.  He also has minor sneezing. He questions whether he may been some swelling of his tongue based on trauma with chewing.    Review of Systems  He specifically denies fever, chills, or sweats. He also has not had significant frontal headache, facial pain, nasal purulence, otic pain, or otic discharge.  Other than the minor wheezing he has no significant extrinsic symptoms of itchy, watery eyes.       Objective:   Physical Exam  General appearance:good health ;well nourished; no acute distress or increased work of breathing is present.  No  lymphadenopathy about the head, neck, or axilla noted.   Eyes: No conjunctival inflammation or lid edema is present.   Ears:  External ear exam shows no significant lesions or deformities.  Hearing aids bilaterally Nose:  External nasal examination shows no deformity or inflammation. Nasal mucosa are pink and moist without lesions or exudates. No septal dislocation or deviation.No obstruction to airflow.  Hyponasal speech  Oral exam: Dental hygiene is good; lips and gums are healthy appearing.There is no oropharyngeal erythema or exudate noted.   Neck:  No deformities, masses, or tenderness noted.     Heart:  Normal rate and regular rhythm. S1 and S2 normal without gallop, murmur, click, rub or other extra sounds.   Lungs:Chest clear to auscultation; no wheezes, rhonchi,rales ,or rubs present.No increased work of breathing.    Extremities:  No cyanosis, edema, or clubbing  noted    Skin: Warm & dry.         Assessment & Plan:  #1 rhinitis with NP cough See orders

## 2013-02-06 ENCOUNTER — Other Ambulatory Visit: Payer: Self-pay

## 2013-02-14 DIAGNOSIS — R404 Transient alteration of awareness: Secondary | ICD-10-CM | POA: Diagnosis not present

## 2013-02-14 DIAGNOSIS — G40401 Other generalized epilepsy and epileptic syndromes, not intractable, with status epilepticus: Secondary | ICD-10-CM | POA: Diagnosis not present

## 2013-02-14 DIAGNOSIS — I671 Cerebral aneurysm, nonruptured: Secondary | ICD-10-CM | POA: Diagnosis not present

## 2013-02-15 ENCOUNTER — Other Ambulatory Visit: Payer: Self-pay | Admitting: Internal Medicine

## 2013-03-13 ENCOUNTER — Encounter: Payer: Self-pay | Admitting: Internal Medicine

## 2013-03-13 ENCOUNTER — Ambulatory Visit: Payer: Medicare Other | Admitting: Internal Medicine

## 2013-03-13 ENCOUNTER — Ambulatory Visit (INDEPENDENT_AMBULATORY_CARE_PROVIDER_SITE_OTHER): Payer: Medicare Other | Admitting: Internal Medicine

## 2013-03-13 VITALS — BP 100/60 | HR 60 | Ht 70.0 in | Wt 253.5 lb

## 2013-03-13 DIAGNOSIS — Z1211 Encounter for screening for malignant neoplasm of colon: Secondary | ICD-10-CM

## 2013-03-13 DIAGNOSIS — K5010 Crohn's disease of large intestine without complications: Secondary | ICD-10-CM | POA: Diagnosis not present

## 2013-03-13 MED ORDER — MOVIPREP 100 G PO SOLR: 1.0000 | Freq: Once | ORAL | Status: DC

## 2013-03-13 NOTE — Patient Instructions (Addendum)
You have been scheduled for a colonoscopy with propofol. Please follow written instructions given to you at your visit today.  Please pick up your prep kit at the pharmacy within the next 1-3 days. If you use inhalers (even only as needed), please bring them with you on the day of your procedure. Your physician has requested that you go to www.[email or web address].com and enter the access code given to you at your visit today. This web site gives a general overview about your procedure. However, you should still follow specific instructions given to you by our office regarding your preparation for the procedure.   CC: Dr Unice Cobble, Bernie Covey

## 2013-03-13 NOTE — Progress Notes (Signed)
Jeff Weaver 1946-01-27 MRN [id number]  History of Present Illness:  This is a 67 year old white male who is here to discuss a recall colonoscopy. He was diagnosed with Crohn's colitis in 1978 and was initially followed at [facility] until 2009. He had numerous colonoscopies which showed acute colitis in 1999.in 2003 showed active colitis. The initial diagnosis of Crohns disease was in 1978. He had a tubular adenoma and hyperplastic polyp in 2005 . His last colonoscopy in July 2009 was during the flareup of colitis and he had lesions consistent with Crohn's disease. He used to be on Rowasa enemas and mesalamine and he was on a prednisone taper but for the past 6 years he has not taken any medications. He underwent  Cerebral artery aneurysm clipping at [facility] in [place] in August 6237 which was complicated by a postoperative stroke and seizures.. He has had a cranial-peritoneal shunt. He is fully recovered except for short-term memory problems and some speech/vocabulary problems. He has been on antiseizure medications , last seizures were in January 2014 and June 2014. He is currently on Keppra and Vimpat. He is followed by a neurologist, Dr Ginny Forth (phone [phone]). He has gained 50 pounds since his hospitalization last year. He has occasional lower abdominal pain across the left to right lower quadrants related to diarrhea or bleeding. He is having about 3 bowel movements a day.   Past Medical History  Diagnosis Date  . Hyperlipidemia   . HTN (hypertension)   . Enteritis (regional)     Dr Olevia Perches  . Rheumatoid arthritis(714.0)     dxed in [employer] in 1980s  . Sleep apnea   . Cerebral aneurysm   . CVA (cerebral infarction)   . Seizures   . Diverticulosis   . GI bleed   . IBS (irritable bowel syndrome)   . Regional enteritis of large intestine since 1978   Past Surgical History  Procedure Laterality Date  . Nose surgery    . Colonoscopy w/ polypectomy  1978    negative  2009, Dr Olevia Perches. Due 2014  . Shoulder surgery  1997  . Hernia repair    . Tonsillectomy and adenoidectomy    . Craniotomy  02/02/12    Dr Harvel Ricks, [place] of aneurysm  . Cns shunt  02/23/12  . 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 Ventriculoperitoneal shunt  02-23-12    INSERTION OF RIGHT FRONTAL VENTRICULOPERITONEAL SHUNT WITH A CODMAN HAKIM PROGRAMMABLE VALVE    reports that he quit smoking about 3 years ago. He has never used smokeless tobacco. He reports that  drinks alcohol. He reports that he does not use illicit drugs. family history includes COPD in his father; Coronary artery disease in his father; Dementia (age of onset: 70) in his sister; Diabetes in his brother and sister; Hepatitis in his mother. Allergies  Allergen Reactions  . Lisinopril     Cough   . Crestor [Rosuvastatin]     Leg pain mainly in knees   . Pravastatin     Rxed by [facility] 2009; "myositis"  . Simvastatin     2007: nausea & vomiting ; V[facility] ( he does not remember such)        Review of Systems: Denies dysphagia heartburn abdominal pain  The remainder of the 10 point ROS is negative except as outlined in H&P   Physical Exam: General appearance  Well developed, in no distress obese. Eyes- non  icteric. HEENT nontraumatic, normocephalic. Mouth no lesions, tongue papillated, no cheilosis. Neck supple without adenopathy, thyroid not enlarged, no carotid bruits, no JVD. Lungs Clear to auscultation bilaterally. Cor normal S1, normal S2, regular rhythm, no murmur,  quiet precordium. Abdomen: Large protuberant but very soft when he lays down. It is very tight when sitting up. There is no fluid wave. Cannot feel his peritoneal shunt which is supposed to be in left middle quadrant. His liver edge is at the costal margin. There is diffuse tenderness in his abdomen mostly in left middle quadrant, left lower quadrant and right lower  quadrant. Rectal: Hemoccult negative soft stool. Extremities 1+ pedal edema. Skin no lesions. Neurological alert and oriented x 3. Psychological normal mood and affect.  Assessment and Plan:  Problem #55 67 year old white male with Crohn's colitis of at least 25 years duration. His last colonoscopy was 6 years ago. He is due for a repeat colonoscopy. He is having minor symptoms such as tenderness, pain and bloating but his stool is Hemoccult negative. He is due for a colonoscopy because of his high-risk for colon cancer. We have discussed the prep, sedation and the procedure itself. He agrees to schedule. We will discuss his sedation with our CRNA and may possibly call Dr Leandra Kern to confirm a choice of conscious moderate sedation vs Propofol.to reduce the risk of seizure.He will likely end up taking a maintenance  Crohn's medication. Problem #2- hx of cerebral artery aneurysm found incidentally 1 year ago and clipped at [facility] 1 year ago. Post op complications. Currently stable.  Problem #2 Patient is status post clipping of cerebral aneurysm one year ago. He has had postoperative stroke and seizures. He is currently stable.   03/13/2013 [name]

## 2013-03-15 ENCOUNTER — Other Ambulatory Visit: Payer: Self-pay | Admitting: Internal Medicine

## 2013-03-26 ENCOUNTER — Encounter: Payer: Self-pay | Admitting: Internal Medicine

## 2013-03-26 ENCOUNTER — Ambulatory Visit (INDEPENDENT_AMBULATORY_CARE_PROVIDER_SITE_OTHER): Payer: Medicare Other | Admitting: Internal Medicine

## 2013-03-26 VITALS — BP 138/81 | HR 65 | Temp 98.1°F | Resp 13 | Wt 253.8 lb

## 2013-03-26 DIAGNOSIS — E785 Hyperlipidemia, unspecified: Secondary | ICD-10-CM | POA: Diagnosis not present

## 2013-03-26 DIAGNOSIS — R7301 Impaired fasting glucose: Secondary | ICD-10-CM

## 2013-03-26 DIAGNOSIS — R7303 Prediabetes: Secondary | ICD-10-CM | POA: Insufficient documentation

## 2013-03-26 DIAGNOSIS — IMO0001 Reserved for inherently not codable concepts without codable children: Secondary | ICD-10-CM

## 2013-03-26 DIAGNOSIS — R7302 Impaired glucose tolerance (oral): Secondary | ICD-10-CM | POA: Insufficient documentation

## 2013-03-26 NOTE — Patient Instructions (Addendum)
Eat a low-fat diet with lots of fruits and vegetables, up to 7-9 servings per day. Consume less than 40  Grams (preferably ZERO) of sugar per day from foods & drinks with High Fructose Corn Syrup (HFCS) sugar as #1,2,3 or # 4 on label.Whole [employer], [employer] do not carry products with HFCS. Follow a  low carb nutrition program such as [place] or The New Sugar Busters  to prevent Diabetes progression . White carbohydrates (potatoes, rice, bread, and pasta) have a high spike of sugar and a high load of sugar. For example a  baked potato has a cup of sugar and a  french fry  2 teaspoons of sugar. Yams, wild  rice, whole grained bread &  wheat pasta have been much lower spike and load of  sugar. Portions should be the size of a deck of cards or your palm.

## 2013-03-26 NOTE — Assessment & Plan Note (Addendum)
An advanced cholesterol test will be performed to optimally assess his cardiovascular risk.  Additionally with that test CK will be drawn to establish a baseline muscle status evaluation.

## 2013-03-26 NOTE — Progress Notes (Signed)
  Subjective:    Patient ID: Ori Trejos, male    DOB: January 08, 1946, 67 y.o.   MRN: [id number]  HPI  This past history from the [facility] was reviewed & data entered into the Problem List He has a history of intolerance to 3 separate statins. Only the history related to pravastatin suggested he could have a statin-related issue.  He states he is able to walk 2 miles before he gets discomfort in his thighs and lower legs.  He has a brother and sister with diabetes with significant complications.  Initially I erroneously was reviewing the labs of another patient which showed uncontrolled diabetes. When  I realized my mistake;  I sincerely apologized to Mr. and Mrs. Toy Cookey for my stupidity. I did discuss the pathophysiology of insulin resistance and prediabetes which I believe is present.Nutritional interventions stressed.   Review of Systems  He denies polydipsia, polyphagia, or polyuria. He has no symptoms of peripheral neuropathy. He does have a lesion of the chest which suggests folliculitis. He has no other nonhealing extremity lesions.  His history is negative for diabetes except when he was in the ICU & was on sliding scale insulin. It was not recommended that he take any diabetic medications .     Objective:   Physical Exam Adequately nourished; weight excess.In no acute distress  No carotid bruits are present.No neck pain distention present at 10 - 15 degrees. Thyroid normal to palpation  Heart rhythm and rate are normal with no significant murmurs or gallops.Heart sounds distant  Chest is clear with no increased work of breathing  There is no evidence of aortic aneurysm or renal artery bruits  Abdomen soft with no organomegaly or masses. No HJR. Protuberant  No clubbing, cyanosis or edema present.  Pedal pulses are intact   No ischemic skin changes are present . Nails healthy    Alert and oriented. Strength, tone, DTRs reflexes normal           Assessment & Plan:   See Current Assessment & Plan in Problem List under specific Diagnosis

## 2013-03-27 ENCOUNTER — Other Ambulatory Visit: Payer: Medicare Other

## 2013-03-27 ENCOUNTER — Other Ambulatory Visit: Payer: Self-pay | Admitting: Internal Medicine

## 2013-03-27 DIAGNOSIS — E785 Hyperlipidemia, unspecified: Secondary | ICD-10-CM | POA: Diagnosis not present

## 2013-03-27 DIAGNOSIS — R7301 Impaired fasting glucose: Secondary | ICD-10-CM

## 2013-03-27 LAB — HEPATIC FUNCTION PANEL
ALT: 48 U/L (ref 0–53)
AST: 25 U/L (ref 0–37)
Albumin: 4 g/dL (ref 3.5–5.2)
Alkaline Phosphatase: 47 U/L (ref 39–117)
Bilirubin, Direct: 0.1 mg/dL (ref 0.0–0.3)
Total Bilirubin: 0.6 mg/dL (ref 0.3–1.2)
Total Protein: 6.9 g/dL (ref 6.0–8.3)

## 2013-03-27 LAB — MICROALBUMIN / CREATININE URINE RATIO
Creatinine,U: 191.4 mg/dL
Microalb Creat Ratio: 7.5 mg/g (ref 0.0–30.0)
Microalb, Ur: 14.4 mg/dL — ABNORMAL HIGH (ref 0.0–1.9)

## 2013-03-27 LAB — BASIC METABOLIC PANEL
BUN: 10 mg/dL (ref 6–23)
CO2: 33 mEq/L — ABNORMAL HIGH (ref 19–32)
Calcium: 9.4 mg/dL (ref 8.4–10.5)
Chloride: 102 mEq/L (ref 96–112)
Creatinine, Ser: 1 mg/dL (ref 0.4–1.5)
GFR: 82.58 mL/min (ref >60.00)
Glucose, Bld: 112 mg/dL — ABNORMAL HIGH (ref 70–99)
Potassium: 4.3 mEq/L (ref 3.5–5.1)
Sodium: 140 mEq/L (ref 135–145)

## 2013-03-27 LAB — HEMOGLOBIN A1C: Hgb A1c MFr Bld: 6.3 % (ref 4.6–6.5)

## 2013-03-27 NOTE — Assessment & Plan Note (Signed)
A1c & urine microalbumin  

## 2013-03-28 ENCOUNTER — Encounter: Payer: Self-pay | Admitting: Internal Medicine

## 2013-03-28 LAB — NMR LIPOPROFILE WITH LIPIDS
Cholesterol, Total: 159 mg/dL (ref <200)
HDL Particle Number: 30.1 umol/L — ABNORMAL LOW (ref >=30.5)
HDL Size: 8.6 nm — ABNORMAL LOW (ref >=9.2)
HDL-C: 45 mg/dL (ref >=40)
LDL (calc): 89 mg/dL (ref <100)
LDL Particle Number: 1054 nmol/L — ABNORMAL HIGH (ref <1000)
LDL Size: 20.9 nm (ref >20.5)
LP-IR Score: 73 — ABNORMAL HIGH (ref <=45)
Large HDL-P: 1.7 umol/L — ABNORMAL LOW (ref >=4.8)
Large VLDL-P: 5.4 nmol/L — ABNORMAL HIGH (ref <=2.7)
Small LDL Particle Number: 367 nmol/L (ref <=527)
Triglycerides: 127 mg/dL (ref <150)
VLDL Size: 47.5 nm — ABNORMAL HIGH (ref <=46.6)

## 2013-04-01 ENCOUNTER — Telehealth: Payer: Self-pay | Admitting: *Deleted

## 2013-04-01 NOTE — Telephone Encounter (Signed)
Spoke with the pt and he is coming in on 04-02-13 to have CK drawn.  Pt will be taking his last Pravastatin today.//AB/CMA

## 2013-04-01 NOTE — Telephone Encounter (Signed)
Message copied by Harl Bowie on Mon Apr 01, 2013  3:20 PM ------      Message from: Hendricks Limes      Created: Sun Mar 31, 2013 11:18 AM       If not done can be rescheduled before he runs out of Pravastatin      ----- Message -----         From: SYSTEM         Sent: 03/31/2013  12:08 AM           To: Hendricks Limes, MD                   ------

## 2013-04-02 ENCOUNTER — Other Ambulatory Visit (INDEPENDENT_AMBULATORY_CARE_PROVIDER_SITE_OTHER): Payer: Medicare Other

## 2013-04-02 DIAGNOSIS — IMO0001 Reserved for inherently not codable concepts without codable children: Secondary | ICD-10-CM

## 2013-04-02 LAB — CK: Total CK: 82 U/L (ref 7–232)

## 2013-04-04 ENCOUNTER — Encounter: Payer: Self-pay | Admitting: Internal Medicine

## 2013-04-04 ENCOUNTER — Telehealth (INDEPENDENT_AMBULATORY_CARE_PROVIDER_SITE_OTHER): Payer: Medicare Other | Admitting: *Deleted

## 2013-04-04 ENCOUNTER — Ambulatory Visit (INDEPENDENT_AMBULATORY_CARE_PROVIDER_SITE_OTHER): Payer: Medicare Other | Admitting: Internal Medicine

## 2013-04-04 VITALS — BP 142/81 | HR 58 | Temp 98.5°F | Wt 257.4 lb

## 2013-04-04 DIAGNOSIS — E785 Hyperlipidemia, unspecified: Secondary | ICD-10-CM

## 2013-04-04 DIAGNOSIS — R079 Chest pain, unspecified: Secondary | ICD-10-CM

## 2013-04-04 DIAGNOSIS — Z23 Encounter for immunization: Secondary | ICD-10-CM

## 2013-04-04 DIAGNOSIS — R7303 Prediabetes: Secondary | ICD-10-CM

## 2013-04-04 DIAGNOSIS — R7309 Other abnormal glucose: Secondary | ICD-10-CM | POA: Diagnosis not present

## 2013-04-04 LAB — CK TOTAL AND CKMB (NOT AT ARMC)
CK, MB: 3.7 ng/mL (ref 0.3–4.0)
Relative Index: 3.5 — ABNORMAL HIGH (ref 0.0–2.5)
Total CK: 107 U/L (ref 7–232)

## 2013-04-04 LAB — TROPONIN I: Troponin I: 0.02 ng/mL (ref <0.06)

## 2013-04-04 LAB — CK: Total CK: 124 U/L (ref 7–232)

## 2013-04-04 MED ORDER — PRAVASTATIN SODIUM 40 MG PO TABS: ORAL_TABLET | ORAL | Status: DC

## 2013-04-04 NOTE — Progress Notes (Signed)
  Subjective:    Patient ID: Jeff Weaver, male    DOB: 04/11/1946, 67 y.o.   MRN: [id number]  HPI  He is here to review his labs which included NMR LipoProfile, A1c, and CK.  At this time his LDL is essentially ideal with a value of 89 with 1054 total particles and 367 small dense particles. His HDL is reduced at 30.1. His triglycerides are 127. LPIR is 73; normal would be less than 45.  His A1c is 6.3 which is in the prediabetes range. This would be associated with an average sugar of 134 and long-term risk of 26%.  They feel that weight gain may be related to [place] prescribed by his neurologist.  He states that he developed substernal chest pain last night after walking 90-120 minutes. This was described as a dull ache without radiation. It resolved over is 20-60 minutes without treatment  This was not associated with nausea or sweating.   Review of Systems  He is pursuing  a heart healthy diet; he exercises 90-120 minutes 4 times per week as walking 2 mpd without symptoms except for thigh pain after the 2 miles intermittently. Specifically he denies chest pain until last night. There is no associated palpitations, dyspnea, or claudication.  Family history is negative for premature coronary disease.  Advanced cholesterol testing reveals his LDL goal is less than 110, ideally < 80 . There is medication compliance with the statin. Significant  myalgias is in thighs after walking 2 miles .      Objective:   Physical Exam  Appears well-nourished & in no acute distress  No carotid bruits are present.No neck pain distention present at 10 - 15 degrees. Thyroid normal to palpation  Heart rhythm and rate are normal with no significant murmurs or gallops. Distant heart sounds  Chest is clear with no increased work of breathing  There is no evidence of aortic aneurysm or renal artery bruits  Abdomen soft with no organomegaly or masses. No HJR. Protuberant  No clubbing, cyanosis or edema  present.  Pedal pulses are intact   No ischemic skin changes are present .   Alert and oriented. Strength, tone, DTRs reflexes normal         Assessment & Plan:  See Current Assessment & Plan in Problem List under specific Diagnosis #2 chest pain  See Orders

## 2013-04-04 NOTE — Telephone Encounter (Signed)
Pt in the office to see provider, flu vaccine given

## 2013-04-04 NOTE — Assessment & Plan Note (Addendum)
   His present risk is extremely low with his LDL of 89 on pravastatin. There was no evidence of muscle injury. He was given the option of taking coenzyme Q 10.

## 2013-04-04 NOTE — Assessment & Plan Note (Addendum)
Discuss Keppra with Neurology in reference to weight gain & pre Diabetes

## 2013-04-04 NOTE — Patient Instructions (Addendum)
Share results with all non [facility] medical staff seen . No exercise until seen by Cardiology.

## 2013-04-08 ENCOUNTER — Ambulatory Visit (INDEPENDENT_AMBULATORY_CARE_PROVIDER_SITE_OTHER): Payer: Medicare Other | Admitting: Cardiology

## 2013-04-08 ENCOUNTER — Encounter: Payer: Self-pay | Admitting: Cardiology

## 2013-04-08 VITALS — BP 112/70 | HR 55 | Ht 70.0 in | Wt 251.0 lb

## 2013-04-08 DIAGNOSIS — I10 Essential (primary) hypertension: Secondary | ICD-10-CM | POA: Diagnosis not present

## 2013-04-08 DIAGNOSIS — R079 Chest pain, unspecified: Secondary | ICD-10-CM | POA: Diagnosis not present

## 2013-04-08 LAB — T4, FREE: Free T4: 1.01 ng/dL (ref 0.60–1.60)

## 2013-04-08 LAB — T3, FREE: T3, Free: 3 pg/mL (ref 2.3–4.2)

## 2013-04-08 LAB — TSH: TSH: 1.27 u[IU]/mL (ref 0.35–5.50)

## 2013-04-08 NOTE — Progress Notes (Signed)
Patient ID: Jeff Weaver, male   DOB: 1946-03-27, 67 y.o.   MRN: [id number]    Patient Name: Aul Mangieri Date of Encounter: 04/08/2013  Primary Care Provider:  Unice Cobble, MD Primary Cardiologist:  Ena Dawley, H  Patient Profile  Chest pain  Problem List   Past Medical History  Diagnosis Date  . Hyperlipidemia   . HTN (hypertension)   . Enteritis (regional)     Dr Olevia Perches  . Rheumatoid arthritis(714.0)     dxed in [employer] in 1980s  . Sleep apnea   . Cerebral aneurysm   . CVA (cerebral infarction)   . Seizures   . Diverticulosis   . GI bleed   . IBS (irritable bowel syndrome)   . Regional enteritis of large intestine since 1978   Past Surgical History  Procedure Laterality Date  . Nose surgery    . Colonoscopy w/ polypectomy  1978    negative 2009, Dr Olevia Perches. Due 2014  . Shoulder surgery  1997  . Hernia repair    . Tonsillectomy and adenoidectomy    . Craniotomy  02/02/12    Dr Harvel Ricks, [place] of aneurysm  . Cns shunt  02/23/12  . 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 Ventriculoperitoneal shunt  02-23-12    INSERTION OF RIGHT FRONTAL VENTRICULOPERITONEAL SHUNT WITH A CODMAN HAKIM PROGRAMMABLE VALVE    Allergies  Allergies  Allergen Reactions  . Lisinopril     Cough   . Crestor [Rosuvastatin]     Leg pain mainly in knees   . Pravastatin     Rxed by [facility] 2009; "myositis" Ok to take per pt's wife/Dr. Linna Darner  . Simvastatin     2007: nausea & vomiting ; [facility] ( he does not remember such)    HPI  A very pleasant 67 year old male with h/o hypertension, hyperlipidemia and impaired glucose tolerance who was referred to [place] by his PCP for the valuation of chest pain. The last year the patient underwent a cerebral aneurysm clipping complicated by intracerebral bleed, brain edema, placement of cerebro-intraperitoeal shunt. As a consequence of his stroke he lost short term memory. The  patient used to be very active, post - surgery still keeping up with walking, but he noticed that he gain significant amount of weight out of proportion to his oral calorie intake.  The patient describes episodes of exertional chest tightness, located on the left side of his sternum, lasting few minutes and relieved by rest. There is no radion, no other associated symptoms. He also has chest pain at rest, that resolve in few minutes on their own.  Home Medications  Prior to Admission medications   Medication Sig Start Date End Date Taking? Authorizing Provider  amLODipine (NORVASC) 2.5 MG tablet Take 2.5 mg by mouth daily.  10/16/12  Yes Hendricks Limes, MD  fluticasone (FLONASE) 50 MCG/ACT nasal spray Place 1 spray into the nose 2 (two) times daily as needed for rhinitis. 01/16/13  Yes Hendricks Limes, MD  hydrochlorothiazide (MICROZIDE) 12.5 MG capsule Take 12.5 mg by mouth daily.   Yes Historical Provider, MD  IRON, FERROUS GLUCONATE, PO Take 65 mg by mouth.   Yes Historical Provider, MD  Lacosamide (VIMPAT) 100 MG TABS Take by mouth. 2 by mouth two times daily   Yes Historical Provider, MD  levETIRAcetam (KEPPRA) 500 MG tablet Take 500 mg by mouth. 3 by mouth in the am, 4 by  mouth in the pm   Yes Historical Provider, MD  losartan (COZAAR) 100 MG tablet Take 1/2 tablet by mouth   every day 02/15/13  Yes Hendricks Limes, MD  MOVIPREP 100 G SOLR Take 1 kit (200 g total) by mouth once. 03/13/13  Yes Lafayette Dragon, MD  NON FORMULARY CPAP Machine: authorized by [facility]   Yes Historical Provider, MD  omeprazole (PRILOSEC) 20 MG capsule Take one capsule by mouth one time daily 03/15/13  Yes Hendricks Limes, MD  pravastatin (PRAVACHOL) 40 MG tablet 1 qhs 04/04/13  Yes Hendricks Limes, MD  pyridoxine (B-6) 100 MG tablet Take 100 mg by mouth daily.   Yes Historical Provider, MD  senna (SENOKOT) 8.6 MG TABS Take 1 tablet by mouth at bedtime.   Yes Historical Provider, MD  Tamsulosin HCl (FLOMAX) 0.4  MG CAPS Take 0.4 mg by mouth. 2 BY MOUTH ONCE DAILY   Yes Historical Provider, MD  thiamine 100 MG tablet Take 100 mg by mouth daily.   Yes Historical Provider, MD    Family History  Family History  Problem Relation Age of Onset  . COPD Father   . Coronary artery disease Father     MI in 33s  . Hepatitis Mother   . Diabetes Sister   . Dementia Sister 47  . Diabetes Brother     Social History  History   Social History  . Marital Status: Married    Spouse Name: N/A    Number of Children: 1  . Years of Education: N/A   Occupational History  . retired    Social History Main Topics  . Smoking status: Former Smoker    Quit date: 07/04/2009  . Smokeless tobacco: Never Used  . Alcohol Use: Yes     Comment: Very Infrequently   . Drug Use: No  . Sexual Activity: Not on file   Other Topics Concern  . Not on file   Social History Narrative  . No narrative on file     Review of Systems General:  No chills, fever, night sweats, significant weight gain.  Cardiovascular:  + chest pain, dyspnea on exertion, edema, orthopnea, palpitations, paroxysmal nocturnal dyspnea. Dermatological: No rash, lesions/masses Respiratory: No cough, dyspnea Urologic: No hematuria, dysuria Abdominal:   No nausea, vomiting, diarrhea, bright red blood per rectum, melena, or hematemesis Neurologic:  No visual changes, wkns, changes in mental status. All other systems reviewed and are otherwise negative except as noted above.  Physical Exam  Blood pressure 112/70, pulse 55, height 5' 10"  (1.778 m), weight 251 lb (113.853 kg).  General: Pleasant, NAD Psych: Normal affect. Neuro: Alert and oriented X 3. Moves all extremities spontaneously. HEENT: Normal  Neck: Supple without bruits or JVD. Lungs:  Resp regular and unlabored, CTA. Heart: RRR no s3, s4, or murmurs. Abdomen: Soft, non-tender, non-distended, BS + x 4.  Extremities: No clubbing, cyanosis or edema. DP/PT/Radials 2+ and equal  bilaterally.  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 Chest pain - exertional, but also at rest, we will order an exercise nuclear stress test. We will make sure he doesn't receive regadenoson as he has a seizure disorder controlled by Keppra.  Continue statin, BB, ARB and ASA.  2. Hypertension - well controlled on current regimen  3. Hyperlipidemia - WNL on recent lab test. Continue Pravachol 40 mg QHS  4. Weight gain - we will check TSH and freeT3,4. There is potential concern for association of using Keppra and weight gain, the patient is going to ask his neurologist.  Follow up in 2 weeks.   Ena Dawley, Lemmie Evens, MD 04/08/2013, 11:38 AM

## 2013-04-08 NOTE — Addendum Note (Signed)
Addended by: Eulis Foster on: 04/08/2013 12:33 PM   Modules accepted: Orders

## 2013-04-08 NOTE — Patient Instructions (Addendum)
Schedule Stress Myoview   Follow instruction given.   Your physician recommends that you schedule a follow-up appointment after test

## 2013-04-10 ENCOUNTER — Ambulatory Visit (AMBULATORY_SURGERY_CENTER): Payer: Medicare Other | Admitting: Internal Medicine

## 2013-04-10 ENCOUNTER — Encounter: Payer: Self-pay | Admitting: Internal Medicine

## 2013-04-10 ENCOUNTER — Encounter (HOSPITAL_COMMUNITY): Payer: Medicare Other

## 2013-04-10 VITALS — BP 166/99 | HR 59 | Temp 97.6°F | Resp 21 | Ht 70.0 in | Wt 253.0 lb

## 2013-04-10 DIAGNOSIS — K589 Irritable bowel syndrome without diarrhea: Secondary | ICD-10-CM | POA: Diagnosis not present

## 2013-04-10 DIAGNOSIS — I440 Atrioventricular block, first degree: Secondary | ICD-10-CM | POA: Diagnosis not present

## 2013-04-10 DIAGNOSIS — Z8719 Personal history of other diseases of the digestive system: Secondary | ICD-10-CM | POA: Diagnosis not present

## 2013-04-10 DIAGNOSIS — K5010 Crohn's disease of large intestine without complications: Secondary | ICD-10-CM

## 2013-04-10 DIAGNOSIS — I10 Essential (primary) hypertension: Secondary | ICD-10-CM | POA: Diagnosis not present

## 2013-04-10 DIAGNOSIS — G4733 Obstructive sleep apnea (adult) (pediatric): Secondary | ICD-10-CM | POA: Diagnosis not present

## 2013-04-10 DIAGNOSIS — Z1211 Encounter for screening for malignant neoplasm of colon: Secondary | ICD-10-CM | POA: Diagnosis not present

## 2013-04-10 DIAGNOSIS — Z8673 Personal history of transient ischemic attack (TIA), and cerebral infarction without residual deficits: Secondary | ICD-10-CM | POA: Diagnosis not present

## 2013-04-10 DIAGNOSIS — R569 Unspecified convulsions: Secondary | ICD-10-CM | POA: Diagnosis not present

## 2013-04-10 DIAGNOSIS — D126 Benign neoplasm of colon, unspecified: Secondary | ICD-10-CM | POA: Diagnosis not present

## 2013-04-10 MED ORDER — SODIUM CHLORIDE 0.9 % IV SOLN: 500.0000 mL | INTRAVENOUS | Status: DC

## 2013-04-10 NOTE — Progress Notes (Signed)
Patient did not experience any of the following events: a burn prior to discharge; a fall within the facility; wrong site/side/patient/procedure/implant event; or a hospital transfer or hospital admission upon discharge from the facility. (G8907) Patient did not have preoperative order for IV antibiotic SSI prophylaxis. (G8918)  

## 2013-04-10 NOTE — Patient Instructions (Signed)
YOU HAD AN ENDOSCOPIC PROCEDURE TODAY AT [facility]: Refer to the procedure report that was given to you for any specific questions about what was found during the examination.  If the procedure report does not answer your questions, please call your gastroenterologist to clarify.  If you requested that your care partner not be given the details of your procedure findings, then the procedure report has been included in a sealed envelope for you to review at your convenience later.  YOU SHOULD EXPECT: Some feelings of bloating in the abdomen. Passage of more gas than usual.  Walking can help get rid of the air that was put into your GI tract during the procedure and reduce the bloating. If you had a lower endoscopy (such as a colonoscopy or flexible sigmoidoscopy) you may notice spotting of blood in your stool or on the toilet paper. If you underwent a bowel prep for your procedure, then you may not have a normal bowel movement for a few days.  DIET: Your first meal following the procedure should be a light meal and then it is ok to progress to your normal diet.  A half-sandwich or bowl of soup is an example of a good first meal.  Heavy or fried foods are harder to digest and may make you feel nauseous or bloated.  Likewise meals heavy in dairy and vegetables can cause extra gas to form and this can also increase the bloating.  Drink plenty of fluids but you should avoid alcoholic beverages for 24 hours.  ACTIVITY: Your care partner should take you home directly after the procedure.  You should plan to take it easy, moving slowly for the rest of the day.  You can resume normal activity the day after the procedure however you should NOT DRIVE or use heavy machinery for 24 hours (because of the sedation medicines used during the test).    SYMPTOMS TO REPORT IMMEDIATELY: A gastroenterologist can be reached at any hour.  During normal business hours, 8:30 AM to 5:00 PM Monday through Friday,  call [phone].  After hours and on weekends, please call the GI answering service at [phone] who will take a message and have the physician on call contact you.   Following lower endoscopy (colonoscopy or flexible sigmoidoscopy):  Excessive amounts of blood in the stool  Significant tenderness or worsening of abdominal pains  Swelling of the abdomen that is new, acute  Fever of 100F or higher    FOLLOW UP: If any biopsies were taken you will be contacted by phone or by letter within the next 1-3 weeks.  Call your gastroenterologist if you have not heard about the biopsies in 3 weeks.  Our staff will call the home number listed on your records the next business day following your procedure to check on you and address any questions or concerns that you may have at that time regarding the information given to you following your procedure. This is a courtesy call and so if there is no answer at the home number and we have not heard from you through the emergency physician on call, we will assume that you have returned to your regular daily activities without incident.  SIGNATURES/CONFIDENTIALITY: You and/or your care partner have signed paperwork which will be entered into your electronic medical record.  These signatures attest to the fact that that the information above on your After Visit Summary has been reviewed and is understood.  Full responsibility of the confidentiality  of this discharge information lies with you and/or your care-partner.   Information on diverticulosis given to you today

## 2013-04-10 NOTE — Progress Notes (Signed)
Tolerated procedule well. Fully awake to PACU.Propofol given over incremental dosages

## 2013-04-10 NOTE — Op Note (Signed)
[place]  [address] [place], [address]   COLONOSCOPY PROCEDURE REPORT  PATIENT: Jeff, Weaver  MR#: [id number] BIRTHDATE: 19-Feb-1946 , 48  yrs. old GENDER: Male ENDOSCOPIST: Lafayette Dragon, MD REFERRED UP:JSRPRXY Linna Darner, M.D. PROCEDURE DATE:  04/10/2013 PROCEDURE:   Colonoscopy, diagnostic First Screening Colonoscopy - Avg.  risk and is 50 yrs.  old or older - No.  Prior Negative Screening - Now for repeat screening. N/A  History of Adenoma - Now for follow-up colonoscopy & has been > or = to 3 yrs.  N/A  Polyps Removed Today? No.  Recommend repeat exam, <10 yrs? Yes.  High risk (family or personal hx). ASA CLASS:   Class III INDICATIONS:High risk patient with previously diagnosed Crohn's since 1978., prior colonoscopy 1999,2003,2005,2009- Crohn's colitis  MEDICATIONS: MAC sedation, administered by CRNA and propofol (Diprivan) 141m IV  DESCRIPTION OF PROCEDURE:   After the risks benefits and alternatives of the procedure were thoroughly explained, informed consent was obtained.  A digital rectal exam revealed no abnormalities of the rectum.   The LB [id number] endoscope was introduced through the anus and advanced to the cecum, which was identified by both the appendix and ileocecal valve. No adverse events experienced.   The quality of the prep was good, using MoviPrep  The instrument was then slowly withdrawn as the colon was fully examined.      COLON FINDINGS: Mild diverticulosis was noted in the sigmoid colon. Melanosis. throughout the colon  Retroflexed views revealed no abnormalities. The time to cecum=6 minutes 26 seconds.  Withdrawal time=7 minutes 42 seconds.  The scope was withdrawn and the procedure completed. COMPLICATIONS: There were no complications.  ENDOSCOPIC IMPRESSION: 1.   Mild diverticulosis was noted in the sigmoid colon 2.   Melanosis 3. no evidence of active colitis, no pseudopolyps, random biopsies of the left,  transverse and right colon  RECOMMENDATIONS: Await pathology results recall colon in 5 years   eSigned:  DLafayette Dragon MD 04/10/2013 3:44 PM   cc:   PATIENT NAME:  [name], BurdiMR#: [id number]

## 2013-04-10 NOTE — Progress Notes (Signed)
Dr Olevia Perches discussed by phone procedure with cardiologist Patient has pending stress test Patient is cleared for colonoscopy. Currently in OR at 1510 discussed with patient R & B of anesthesia he understands and consents. Agrees with plan of light propofol sedation .Dr Olevia Perches aware

## 2013-04-10 NOTE — Progress Notes (Signed)
Called to room to assist during endoscopic procedure.  Patient ID and intended procedure confirmed with present staff. Received instructions for my participation in the procedure from the performing physician.  

## 2013-04-11 ENCOUNTER — Telehealth: Payer: Self-pay | Admitting: *Deleted

## 2013-04-11 NOTE — Telephone Encounter (Signed)
  Follow up Call-  Call back number 04/10/2013  Post procedure Call Back phone  # [phone]  Permission to leave phone message Yes     Patient questions:  Do you have a fever, pain , or abdominal swelling? no Pain Score  0 *  Have you tolerated food without any problems? yes  Have you been able to return to your normal activities? yes  Do you have any questions about your discharge instructions: Diet   no Medications  no Follow up visit  no  Do you have questions or concerns about your Care? no  Actions: * If pain score is 4 or above: No action needed, pain <4.

## 2013-04-13 ENCOUNTER — Other Ambulatory Visit: Payer: Self-pay | Admitting: Internal Medicine

## 2013-04-15 ENCOUNTER — Ambulatory Visit (HOSPITAL_COMMUNITY): Payer: Medicare Other | Attending: Cardiology | Admitting: Radiology

## 2013-04-15 ENCOUNTER — Other Ambulatory Visit: Payer: Self-pay | Admitting: *Deleted

## 2013-04-15 VITALS — BP 163/84 | Ht 70.0 in | Wt 251.0 lb

## 2013-04-15 DIAGNOSIS — I10 Essential (primary) hypertension: Secondary | ICD-10-CM | POA: Diagnosis not present

## 2013-04-15 DIAGNOSIS — Z8673 Personal history of transient ischemic attack (TIA), and cerebral infarction without residual deficits: Secondary | ICD-10-CM | POA: Insufficient documentation

## 2013-04-15 DIAGNOSIS — R0602 Shortness of breath: Secondary | ICD-10-CM | POA: Insufficient documentation

## 2013-04-15 DIAGNOSIS — Z8249 Family history of ischemic heart disease and other diseases of the circulatory system: Secondary | ICD-10-CM | POA: Insufficient documentation

## 2013-04-15 DIAGNOSIS — R079 Chest pain, unspecified: Secondary | ICD-10-CM

## 2013-04-15 DIAGNOSIS — R002 Palpitations: Secondary | ICD-10-CM | POA: Diagnosis not present

## 2013-04-15 DIAGNOSIS — Z87891 Personal history of nicotine dependence: Secondary | ICD-10-CM | POA: Diagnosis not present

## 2013-04-15 MED ORDER — TECHNETIUM TC 99M SESTAMIBI GENERIC - CARDIOLITE
11.0000 | Freq: Once | INTRAVENOUS | Status: AC | PRN
  Administered 2013-04-15: 11 via INTRAVENOUS

## 2013-04-15 MED ORDER — TECHNETIUM TC 99M SESTAMIBI GENERIC - CARDIOLITE
33.0000 | Freq: Once | INTRAVENOUS | Status: AC | PRN
  Administered 2013-04-15: 33 via INTRAVENOUS

## 2013-04-15 MED ORDER — AMLODIPINE BESYLATE 2.5 MG PO TABS: 2.5000 mg | ORAL_TABLET | Freq: Every day | ORAL | Status: DC

## 2013-04-15 NOTE — Telephone Encounter (Signed)
Amlodipine refill sent to pharmacy

## 2013-04-15 NOTE — Progress Notes (Signed)
[facility] [address] [place], [place] [address] [phone]    Cardiology Nuclear Med Study  Jeff Weaver is a 67 y.o. male     MRN : [id number]     DOB: 1945/10/15  Procedure Date: 04/15/2013  Nuclear Med Background Indication for Stress Test:  Evaluation for Ischemia History:  30 years ago  Myocardial Perfusion Study- Ok per patient Cardiac Risk Factors: CVA, Family History - CAD, History of Smoking, Hypertension and Lipids  Symptoms:  Chest Pain, Chest Tightness with Exertion, Palpitations and SOB   Nuclear Pre-Procedure Caffeine/Decaff Intake:  6:30pm NPO After: 6:30pm   Lungs:  clear O2 Sat: 99% on room air. IV 0.9% NS with Angio Cath:  22g  IV Site: R Antecubital x 1, tolerate well IV Started by:  Jennelle Human, CNMT  Chest Size (in):  48-50 Cup Size: n/a  Height: 5' 10"  (1.778 m)  Weight:  251 lb (113.853 kg)  BMI:  Body mass index is 36.01 kg/(m^2). Tech Comments:  Took Norvasc, Cozaar, and seizure medications    Nuclear Med Study 1 or 2 day study: 1 day  Stress Test Type:  Stress  Reading MD: Dola Argyle, MD  Order Authorizing Provider:  Ena Dawley, MD  Resting Radionuclide: Technetium 33mSestamibi  Resting Radionuclide Dose: 11.0 mCi   Stress Radionuclide:  Technetium 949mestamibi  Stress Radionuclide Dose: 33.0 mCi           Stress Protocol Rest HR: 43 Stress HR: 120  Rest BP: 163/84 Stress BP: 160/65  Exercise Time (min): 6:45 METS: 6.80   Predicted Max HR: 152 bpm % Max HR: 78.95 bpm Rate Pressure Product: 19200   Dose of Adenosine (mg):  n/a Dose of Lexiscan: n/a mg  Dose of Atropine (mg): n/a Dose of Dobutamine: n/a mcg/kg/min (at max HR)  Stress Test Technologist: SaPerrin MalteseEMT-P  Nuclear Technologist:  StCharlton AmorCNMT     Rest Procedure:  Myocardial perfusion imaging was performed at rest 45 minutes following the intravenous administration of Technetium 9927mstamibi. Rest ECG: Marked sinus  bradycardia with ST scooping.  Stress Procedure:  The patient exercised on the treadmill utilizing the Bruce Protocol for 6:45 minutes. The patient stopped due to fatigue, sob, and denied any chest pain.  Technetium 37m29mtamibi was injected at peak exercise and myocardial perfusion imaging was performed after a brief delay. Stress ECG: Nondiagnostic ST changes  QPS Raw Data Images:  Normal; no motion artifact; normal heart/lung ratio. Stress Images:  Mild relative decreased uptake in the inferior wall. Rest Images:  Mild relative decreased uptake in the inferior wall Subtraction (SDS):  No evidence of ischemia. Transient Ischemic Dilatation (Normal <1.22):  n/a Lung/Heart Ratio (Normal <0.45):  0.42  Quantitative Gated Spect Images QGS EDV:  119 ml QGS ESV:  35 ml  Impression Exercise Capacity:  Decreased exercise tolerance BP Response:  Normal blood pressure response. Clinical Symptoms:  No significant symptoms noted. ECG Impression:  Nondiagnostic increase in ST scooping with stress Comparison with Prior Nuclear Study: No images to compare  Overall Impression:  The study reveals no diagnostic abnormalities. There is mild relative decreased activity in the inferior wall. There is no diagnostic abnormality. There is no scar or ischemia. There is normal wall motion. This is a low risk scan.  LV Ejection Fraction: 72%.  LV Wall Motion:  NL LV Function; NL Wall Motion.  [name]

## 2013-04-16 ENCOUNTER — Encounter: Payer: Self-pay | Admitting: Internal Medicine

## 2013-04-24 ENCOUNTER — Other Ambulatory Visit: Payer: Self-pay | Admitting: Internal Medicine

## 2013-04-24 NOTE — Telephone Encounter (Signed)
Flonase refill sent to pharmacy

## 2013-04-28 ENCOUNTER — Other Ambulatory Visit: Payer: Self-pay | Admitting: Internal Medicine

## 2013-04-29 ENCOUNTER — Telehealth: Payer: Self-pay | Admitting: *Deleted

## 2013-04-29 ENCOUNTER — Other Ambulatory Visit: Payer: Self-pay | Admitting: *Deleted

## 2013-04-29 DIAGNOSIS — E785 Hyperlipidemia, unspecified: Secondary | ICD-10-CM

## 2013-04-29 NOTE — Telephone Encounter (Signed)
04/29/2013  Pt wife came by, patient Jeff Weaver is needing a refill on pravastatin (PRAVACHOL) 40 MG tablet.  Patient has enough to get through this Saturday, November 1st.  Please advise if they need to come by and pick up the paper prescription, or if you can sent it to [facility] on [address] in [place].    Thank you! Zyliah Schier

## 2013-04-30 ENCOUNTER — Ambulatory Visit: Payer: Medicare Other | Admitting: Cardiology

## 2013-04-30 MED ORDER — PRAVASTATIN SODIUM 40 MG PO TABS: ORAL_TABLET | ORAL | Status: DC

## 2013-04-30 NOTE — Telephone Encounter (Signed)
Med filled.  

## 2013-05-01 DIAGNOSIS — H5200 Hypermetropia, unspecified eye: Secondary | ICD-10-CM | POA: Diagnosis not present

## 2013-05-29 DIAGNOSIS — D235 Other benign neoplasm of skin of trunk: Secondary | ICD-10-CM | POA: Diagnosis not present

## 2013-05-29 DIAGNOSIS — L820 Inflamed seborrheic keratosis: Secondary | ICD-10-CM | POA: Diagnosis not present

## 2013-06-10 ENCOUNTER — Other Ambulatory Visit: Payer: Self-pay | Admitting: Internal Medicine

## 2013-06-10 NOTE — Telephone Encounter (Signed)
Omeprazole refilled per protocol

## 2013-06-11 DIAGNOSIS — G9389 Other specified disorders of brain: Secondary | ICD-10-CM | POA: Diagnosis not present

## 2013-06-11 DIAGNOSIS — I10 Essential (primary) hypertension: Secondary | ICD-10-CM | POA: Diagnosis present

## 2013-06-11 DIAGNOSIS — Z982 Presence of cerebrospinal fluid drainage device: Secondary | ICD-10-CM | POA: Diagnosis not present

## 2013-06-11 DIAGNOSIS — G40209 Localization-related (focal) (partial) symptomatic epilepsy and epileptic syndromes with complex partial seizures, not intractable, without status epilepticus: Secondary | ICD-10-CM | POA: Diagnosis present

## 2013-06-11 DIAGNOSIS — R569 Unspecified convulsions: Secondary | ICD-10-CM | POA: Diagnosis not present

## 2013-06-11 DIAGNOSIS — R918 Other nonspecific abnormal finding of lung field: Secondary | ICD-10-CM | POA: Diagnosis not present

## 2013-06-11 DIAGNOSIS — R0989 Other specified symptoms and signs involving the circulatory and respiratory systems: Secondary | ICD-10-CM | POA: Diagnosis not present

## 2013-06-11 DIAGNOSIS — R220 Localized swelling, mass and lump, head: Secondary | ICD-10-CM | POA: Diagnosis not present

## 2013-06-11 DIAGNOSIS — R4182 Altered mental status, unspecified: Secondary | ICD-10-CM | POA: Diagnosis not present

## 2013-06-11 DIAGNOSIS — E785 Hyperlipidemia, unspecified: Secondary | ICD-10-CM | POA: Diagnosis not present

## 2013-06-11 DIAGNOSIS — N400 Enlarged prostate without lower urinary tract symptoms: Secondary | ICD-10-CM | POA: Diagnosis present

## 2013-06-11 DIAGNOSIS — G40802 Other epilepsy, not intractable, without status epilepticus: Secondary | ICD-10-CM | POA: Diagnosis not present

## 2013-06-11 DIAGNOSIS — Z9889 Other specified postprocedural states: Secondary | ICD-10-CM | POA: Diagnosis not present

## 2013-06-11 DIAGNOSIS — G40909 Epilepsy, unspecified, not intractable, without status epilepticus: Secondary | ICD-10-CM | POA: Diagnosis not present

## 2013-06-24 ENCOUNTER — Ambulatory Visit (INDEPENDENT_AMBULATORY_CARE_PROVIDER_SITE_OTHER): Payer: Medicare Other | Admitting: Internal Medicine

## 2013-06-24 ENCOUNTER — Encounter: Payer: Self-pay | Admitting: Internal Medicine

## 2013-06-24 ENCOUNTER — Other Ambulatory Visit: Payer: Self-pay | Admitting: Internal Medicine

## 2013-06-24 VITALS — BP 136/80 | HR 68 | Temp 97.8°F | Wt 258.2 lb

## 2013-06-24 DIAGNOSIS — G40909 Epilepsy, unspecified, not intractable, without status epilepticus: Secondary | ICD-10-CM

## 2013-06-24 DIAGNOSIS — R141 Gas pain: Secondary | ICD-10-CM

## 2013-06-24 DIAGNOSIS — R140 Abdominal distension (gaseous): Secondary | ICD-10-CM

## 2013-06-24 DIAGNOSIS — R220 Localized swelling, mass and lump, head: Secondary | ICD-10-CM | POA: Diagnosis not present

## 2013-06-24 DIAGNOSIS — E8881 Metabolic syndrome: Secondary | ICD-10-CM

## 2013-06-24 DIAGNOSIS — R7309 Other abnormal glucose: Secondary | ICD-10-CM

## 2013-06-24 MED ORDER — PRAVASTATIN SODIUM 40 MG PO TABS: ORAL_TABLET | ORAL | Status: DC

## 2013-06-24 NOTE — Progress Notes (Signed)
Pre visit review using our clinic review tool, if applicable. No additional management support is needed unless otherwise documented below in the visit note. 

## 2013-06-24 NOTE — Patient Instructions (Addendum)
Follow the low carb nutrition program in [facility] as closely as possible to prevent Diabetes progression & complications. White carbohydrates (potatoes, rice, bread, and pasta) have a high spike of sugar and a high load of sugar. For example a  baked potato has a cup of sugar and a  french fry  2 teaspoons of sugar. Yams, wild  rice, whole grained bread &  wheat pasta have been much lower spike and load of  sugar.  Please keep a diary concerning any tongue swelling and abdominal bloating. If this persists or progresses; the losartan will need to be discontinued.

## 2013-06-24 NOTE — Progress Notes (Signed)
Subjective:    Patient ID: Jeff Weaver, male    DOB: 27-Apr-1946, 67 y.o.   MRN: [id number]  HPI He was hospitalized at [facility] 12/9-12/11/14 for seizures. No change was made in his medications; he does have a followup with [facility] 1/7 and with Dr. Rollene Rotunda and January 15.    Review of Systems    He feels as if he has swelling of the right lateral tongue. He describes it as if he had "bitten his tongue". He is on losartan. He does not have visible swelling of his lips or tongue by history   He describes some air hunger at night using CPAP. He questions whether this does bloat him.  He also has some exertional dyspnea.  He's had some lower abdominal discomfort particularly when he tries to sit up after being supine for a period  He specifically denies any unexplained weight loss, melena, rectal bleeding, or change in the caliber of the stool. His colonoscopy is up-to-date.      Objective:   Physical Exam Gen.: Central obesitypresent. Alert and cooperative throughout exam.  Head: Normocephalic with post op changes. Eyes: No corneal or conjunctival inflammation noted. Pupils equal round reactive to light and accommodation.   Nose: External nasal exam reveals no deformity or inflammation. Nasal mucosa are dry. No lesions or exudates noted. Septum to R Mouth: Oral mucosa and oropharynx reveal no lesions or exudates. Teeth in good repair.No tongue swelling. Neck: No deformities, masses, or tenderness noted.  Thyroid normal Lungs: Normal respiratory effort; chest expands symmetrically. Lungs are clear to auscultation without rales, wheezes, or increased work of breathing.Decreased BS Heart: Normal rate and rhythm. Normal S1 and S2. No gallop, click, or rub. No murmur. Abdomen: Bowel sounds normal; abdomen soft and nontender. No masses, organomegaly or hernias noted. Massive abdomen 50 inches circumference                                     Musculoskeletal/extremities: . No clubbing, cyanosis, edema, or significant extremity  deformity noted. Range of motion normal .Tone & strength normal. Hand joints normal . Fingernail health good. Able to lie down w/o help but slight difficulty sitting up due to central obesity.  Vascular: Carotid, radial artery, dorsalis pedis and  posterior tibial pulses are full and equal. No bruits present. Neurologic: Alert and oriented x3. Deep tendon reflexes symmetrical and normal.        Skin: Intact without suspicious lesions or rashes. Lymph: No cervical, axillary lymphadenopathy present. Psych: Mood and affect are normal. Normally interactive; but he is refreshed related can't express himself. 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                                                                                       Assessment & Plan:  #1 seizure disorder as per Dr Rollene Rotunda #2 metabolic syndrome with prediabetes A1c value. I discussed the pathophysiology of central obesity and insulin resistance with progression to diabetes. Nutrition involvement stressed. He maintains that he  is on healthy diet; but the data and physical do not correlate with such.A1c in 1 month. #3 ? ARB reaction with ? Localized as subjective  tongue swelling & bloating. Blood pressure is well controlled on losartan;  I hesitate  to make a change. His bloating may be related to CPAP use. See orders

## 2013-07-10 DIAGNOSIS — G4733 Obstructive sleep apnea (adult) (pediatric): Secondary | ICD-10-CM | POA: Diagnosis not present

## 2013-07-10 DIAGNOSIS — E669 Obesity, unspecified: Secondary | ICD-10-CM | POA: Diagnosis not present

## 2013-07-10 DIAGNOSIS — R569 Unspecified convulsions: Secondary | ICD-10-CM | POA: Diagnosis not present

## 2013-07-17 DIAGNOSIS — I6200 Nontraumatic subdural hemorrhage, unspecified: Secondary | ICD-10-CM | POA: Insufficient documentation

## 2013-07-17 DIAGNOSIS — Z982 Presence of cerebrospinal fluid drainage device: Secondary | ICD-10-CM | POA: Diagnosis not present

## 2013-07-17 DIAGNOSIS — R4701 Aphasia: Secondary | ICD-10-CM | POA: Insufficient documentation

## 2013-07-17 DIAGNOSIS — G40209 Localization-related (focal) (partial) symptomatic epilepsy and epileptic syndromes with complex partial seizures, not intractable, without status epilepticus: Secondary | ICD-10-CM | POA: Insufficient documentation

## 2013-07-17 DIAGNOSIS — G40109 Localization-related (focal) (partial) symptomatic epilepsy and epileptic syndromes with simple partial seizures, not intractable, without status epilepticus: Secondary | ICD-10-CM | POA: Diagnosis not present

## 2013-07-25 ENCOUNTER — Other Ambulatory Visit (INDEPENDENT_AMBULATORY_CARE_PROVIDER_SITE_OTHER): Payer: Medicare Other

## 2013-07-25 DIAGNOSIS — R7309 Other abnormal glucose: Secondary | ICD-10-CM

## 2013-07-25 DIAGNOSIS — E8881 Metabolic syndrome: Secondary | ICD-10-CM

## 2013-07-25 LAB — HEMOGLOBIN A1C: Hgb A1c MFr Bld: 6.3 % (ref 4.6–6.5)

## 2013-07-26 DIAGNOSIS — R7309 Other abnormal glucose: Secondary | ICD-10-CM | POA: Diagnosis not present

## 2013-07-26 DIAGNOSIS — R350 Frequency of micturition: Secondary | ICD-10-CM | POA: Diagnosis not present

## 2013-07-26 DIAGNOSIS — E785 Hyperlipidemia, unspecified: Secondary | ICD-10-CM | POA: Diagnosis not present

## 2013-07-26 DIAGNOSIS — I10 Essential (primary) hypertension: Secondary | ICD-10-CM | POA: Diagnosis not present

## 2013-08-03 DIAGNOSIS — E669 Obesity, unspecified: Secondary | ICD-10-CM | POA: Diagnosis not present

## 2013-08-03 DIAGNOSIS — S01502A Unspecified open wound of oral cavity, initial encounter: Secondary | ICD-10-CM | POA: Diagnosis not present

## 2013-08-03 DIAGNOSIS — G40909 Epilepsy, unspecified, not intractable, without status epilepticus: Secondary | ICD-10-CM | POA: Diagnosis not present

## 2013-08-03 DIAGNOSIS — G40309 Generalized idiopathic epilepsy and epileptic syndromes, not intractable, without status epilepticus: Secondary | ICD-10-CM | POA: Diagnosis not present

## 2013-08-03 DIAGNOSIS — R569 Unspecified convulsions: Secondary | ICD-10-CM | POA: Diagnosis not present

## 2013-08-03 DIAGNOSIS — Z79899 Other long term (current) drug therapy: Secondary | ICD-10-CM | POA: Diagnosis not present

## 2013-08-03 DIAGNOSIS — I440 Atrioventricular block, first degree: Secondary | ICD-10-CM | POA: Diagnosis not present

## 2013-08-03 DIAGNOSIS — R0989 Other specified symptoms and signs involving the circulatory and respiratory systems: Secondary | ICD-10-CM | POA: Diagnosis not present

## 2013-08-03 DIAGNOSIS — I10 Essential (primary) hypertension: Secondary | ICD-10-CM | POA: Diagnosis not present

## 2013-08-09 ENCOUNTER — Other Ambulatory Visit: Payer: Self-pay | Admitting: Internal Medicine

## 2013-08-09 NOTE — Telephone Encounter (Signed)
Rx sent to the pharmacy by e-script.//AB/CMA 

## 2013-08-31 ENCOUNTER — Other Ambulatory Visit: Payer: Self-pay | Admitting: Internal Medicine

## 2013-09-03 ENCOUNTER — Encounter: Payer: Medicare Other | Attending: Internal Medicine | Admitting: Dietician

## 2013-09-03 ENCOUNTER — Encounter: Payer: Self-pay | Admitting: Dietician

## 2013-09-03 VITALS — Ht 70.0 in | Wt 264.9 lb

## 2013-09-03 DIAGNOSIS — R7309 Other abnormal glucose: Secondary | ICD-10-CM | POA: Diagnosis not present

## 2013-09-03 DIAGNOSIS — Z6838 Body mass index (BMI) 38.0-38.9, adult: Secondary | ICD-10-CM | POA: Insufficient documentation

## 2013-09-03 DIAGNOSIS — Z713 Dietary counseling and surveillance: Secondary | ICD-10-CM | POA: Diagnosis not present

## 2013-09-03 DIAGNOSIS — E669 Obesity, unspecified: Secondary | ICD-10-CM | POA: Diagnosis not present

## 2013-09-03 DIAGNOSIS — Z833 Family history of diabetes mellitus: Secondary | ICD-10-CM | POA: Insufficient documentation

## 2013-09-03 NOTE — Patient Instructions (Signed)
Eat breakfast - egg fried in Pam cooking spray with vegetables and 1 piece of toast with butter. If you are hungry, have a snack (2-3) have carbohydrate with protein (cheese and crackers, peanut butter, nuts, fruit). Portion out your snacks. Fill up half of your plate with vegetables at lunch and dinner.  Choose one starch to have at lunch/dinner (quarter).  Continue drinking mostly water and black coffee. Continue exercising 5 x week and consider looking some adding weight bearing exercise (if cleared).  Look into ways to help reduce stress and improve sleep.

## 2013-09-03 NOTE — Progress Notes (Signed)
  Medical Nutrition Therapy:  Appt start time: 0945 end time:  1100.  Assessment:  Primary concerns today: Jeff Weaver is here today to lose weight.  His Hgb A1c was 6.3% on 07/25/2013. Is retired from [employer] in 1991 and hasn't had a full time job in the past year.  States that the doctor is concerned about his waist circumference and family hx of diabetes.   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 Wife states that he gained weight starting when he was in rehab at that time. Wife states that his short term is deficient and might snack and not remember. Went from a 38 inch waist to a ~ 50 inch waist (about 50 lb weight gain).   Lives with his wife, Jeff Weaver who does most of the food preparation. States that they go through periods of eating out more (3-4 x week). Will sometimes eat when bored and is not hungry in the morning. Walks 5 x week.   Preferred Learning Style:   No preference indicated   Learning Readiness:   Ready  MEDICATIONS: see list   DIETARY INTAKE:  24-hr recall:  B ( AM): black coffee (not hungry)  Snk ( AM): none  L ( PM): leftovers with water Snk ( PM): nabs sometimes D ( PM): beef stew, pork chops with macaroni cheese (1/2 cup), green beans, tomatoes Snk ( PM): cheese crackers (may be large) Beverages: most of the time water, diet tonic water, cola sometimes, black coffee in morning   Usual physical activity: walking 5 x week with other gentlemen takes about 45-60 hours with "rests" and "visits")  Estimated energy needs: 2000 calories 225 g carbohydrates 150 g protein 56 g fat  Progress Towards Goal(s):  In progress.   Nutritional Diagnosis:  -3.3 Overweight/obesity As related to history of frequent snacking and reduced physical acitivity .  As evidenced by BMI of 38.0 and Hgb A1c of 6.3%.    Intervention:  Nutrition counseling provided. Plan: Eat breakfast - egg fried in Pam cooking spray with  vegetables and 1 piece of toast with butter. If you are hungry, have a snack (2-3) have carbohydrate with protein (cheese and crackers, peanut butter, nuts, fruit). Portion out your snacks. Fill up half of your plate with vegetables at lunch and dinner.  Choose one starch to have at lunch/dinner (quarter).  Continue drinking mostly water and black coffee. Continue exercising 5 x week and consider looking some adding weight bearing exercise (if cleared).  Look into ways to help reduce stress and improve sleep.   Teaching Method Utilized: Visual Auditory Hands on  Handouts given during visit include:  Living Well with Diabetes  Yellow Card  Blood Sugar Monitoring  MyPlate Handout  16X CHO Snacks  Barriers to learning/adherence to lifestyle change: short term memory issues  Demonstrated degree of understanding via:  Teach Back   Monitoring/Evaluation:  Dietary intake, exercise, mindful eating, and body weight in 2 month(s).

## 2013-09-12 ENCOUNTER — Other Ambulatory Visit: Payer: Self-pay | Admitting: Internal Medicine

## 2013-09-27 DIAGNOSIS — M79609 Pain in unspecified limb: Secondary | ICD-10-CM | POA: Diagnosis not present

## 2013-09-27 DIAGNOSIS — B351 Tinea unguium: Secondary | ICD-10-CM | POA: Diagnosis not present

## 2013-10-08 DIAGNOSIS — G40219 Localization-related (focal) (partial) symptomatic epilepsy and epileptic syndromes with complex partial seizures, intractable, without status epilepticus: Secondary | ICD-10-CM | POA: Diagnosis not present

## 2013-11-04 ENCOUNTER — Encounter: Payer: Medicare Other | Attending: Internal Medicine | Admitting: Dietician

## 2013-11-04 VITALS — Ht 70.0 in | Wt 258.1 lb

## 2013-11-04 DIAGNOSIS — Z713 Dietary counseling and surveillance: Secondary | ICD-10-CM | POA: Diagnosis not present

## 2013-11-04 DIAGNOSIS — E669 Obesity, unspecified: Secondary | ICD-10-CM | POA: Diagnosis not present

## 2013-11-04 DIAGNOSIS — Z833 Family history of diabetes mellitus: Secondary | ICD-10-CM | POA: Insufficient documentation

## 2013-11-04 NOTE — Patient Instructions (Addendum)
Eat breakfast - add a boiled egg to have a with oatmeal or toast at breakfast.  Have carnation instant breakfast with skim milk if you do not want to eat a meal.  If you are hungry, have a snack (2-3) have carbohydrate with protein (cheese and crackers, peanut butter, nuts, fruit). Fill up half of your plate with vegetables at lunch and dinner.  Choose one starch to have at lunch/dinner (quarter of plate).  Continue drinking mostly water and black coffee. Continue exercising 5 x week and consider looking some adding weight bearing exercise (if cleared).  Continue not having sweets around (avoid temptation).

## 2013-11-04 NOTE — Progress Notes (Signed)
  Medical Nutrition Therapy:  Appt start time: 1478 end time:  2956.  Assessment:  Primary concerns today: Karla is here for a follow up for weight loss. Lost about 6 lbs since last visit. Has been doing exercise and trying to eat less. Still walking in the evening with his buddies either outside or at the mall if the weather is bad. Started having [employer] with 2% milk every 2-3 days. Will have instant oatmeal sometimes for breakfast or will skip sometimes. States that he feels full most of the time. Planning to talk to his doctor about bloating feelings (around the first week of June).   Still having short term memory issues and some frustration with not being able to remember things.   Wt Readings from Last 3 Encounters:  11/04/13 258 lb 1.6 oz (117.073 kg)  09/03/13 264 lb 14.4 oz (120.158 kg)  06/24/13 258 lb 3.2 oz (117.119 kg)   Ht Readings from Last 3 Encounters:  11/04/13 5' 10"  (1.778 m)  09/03/13 5' 10"  (1.778 m)  04/15/13 5' 10"  (1.778 m)   Body mass index is 37.03 kg/(m^2). @BMIFA @ Normalized weight-for-age data available only for age 19 to 22 years. Normalized stature-for-age data available only for age 19 to 65 years.   Preferred Learning Style:   No preference indicated   Learning Readiness:   Ready  MEDICATIONS: see list   DIETARY INTAKE:  24-hr recall:  B (8:30-10:30 AM): black coffee (not hungry) carnation instant breakfast, yogurt, or oatmeal (flavored) skips 3 x week  Snk ( AM): none  L ( PM): skips lunch 2-3 x week, sandwich or leftovers, salad from fast food with water Snk ( PM): nabs sometimes D ( PM): beef stew, pork chops with macaroni cheese (1/2 cup), green beans, tomatoes, shrimp, salad, or baked potato Snk ( PM): cheese crackers (may be large) or nabs Beverages: most of the time water, diet tonic water, cola sometimes, black coffee in morning   Usual physical activity: walking 5 x week with other gentlemen takes about 2 - 2.5  hours at least with "rests" and "visits")  Estimated energy needs: 2000 calories 225 g carbohydrates 150 g protein 56 g fat  Progress Towards Goal(s):  In progress.   Nutritional Diagnosis:  [place]-3.3 Overweight/obesity As related to history of frequent snacking and reduced physical acitivity .  As evidenced by BMI of 38.0 and Hgb A1c of 6.3%.    Intervention:  Nutrition counseling provided. Plan: Eat breakfast - add a boiled egg to have a with oatmeal or toast at breakfast.  Have carnation instant breakfast with skim milk if you do not want to eat a meal.  If you are hungry, have a snack (2-3) have carbohydrate with protein (cheese and crackers, peanut butter, nuts, fruit). Fill up half of your plate with vegetables at lunch and dinner.  Choose one starch to have at lunch/dinner (quarter of plate).  Continue drinking mostly water and black coffee. Continue exercising 5 x week and consider looking some adding weight bearing exercise (if cleared).  Continue not having sweets around (avoid temptation).   Teaching Method Utilized: Visual Auditory Hands on   Barriers to learning/adherence to lifestyle change: short term memory issues  Demonstrated degree of understanding via:  Teach Back   Monitoring/Evaluation:  Dietary intake, exercise, mindful eating, and body weight in 2 month(s).

## 2013-11-24 ENCOUNTER — Encounter: Payer: Self-pay | Admitting: Internal Medicine

## 2013-11-24 DIAGNOSIS — Z298 Encounter for other specified prophylactic measures: Secondary | ICD-10-CM | POA: Insufficient documentation

## 2013-11-25 ENCOUNTER — Encounter (HOSPITAL_BASED_OUTPATIENT_CLINIC_OR_DEPARTMENT_OTHER): Payer: Self-pay | Admitting: Emergency Medicine

## 2013-11-25 ENCOUNTER — Emergency Department (HOSPITAL_BASED_OUTPATIENT_CLINIC_OR_DEPARTMENT_OTHER): Payer: Medicare Other

## 2013-11-25 ENCOUNTER — Inpatient Hospital Stay (HOSPITAL_BASED_OUTPATIENT_CLINIC_OR_DEPARTMENT_OTHER)
Admission: EM | Admit: 2013-11-25 | Discharge: 2013-11-27 | DRG: 281 | Disposition: A | Discharge status: Home / Self Care | Payer: Medicare Other | Attending: Internal Medicine | Admitting: Internal Medicine

## 2013-11-25 DIAGNOSIS — M79609 Pain in unspecified limb: Secondary | ICD-10-CM | POA: Diagnosis not present

## 2013-11-25 DIAGNOSIS — Z833 Family history of diabetes mellitus: Secondary | ICD-10-CM

## 2013-11-25 DIAGNOSIS — I359 Nonrheumatic aortic valve disorder, unspecified: Secondary | ICD-10-CM | POA: Diagnosis not present

## 2013-11-25 DIAGNOSIS — Z888 Allergy status to other drugs, medicaments and biological substances status: Secondary | ICD-10-CM | POA: Diagnosis not present

## 2013-11-25 DIAGNOSIS — Z79899 Other long term (current) drug therapy: Secondary | ICD-10-CM | POA: Diagnosis not present

## 2013-11-25 DIAGNOSIS — I252 Old myocardial infarction: Secondary | ICD-10-CM | POA: Insufficient documentation

## 2013-11-25 DIAGNOSIS — R079 Chest pain, unspecified: Secondary | ICD-10-CM

## 2013-11-25 DIAGNOSIS — Z8673 Personal history of transient ischemic attack (TIA), and cerebral infarction without residual deficits: Secondary | ICD-10-CM | POA: Diagnosis not present

## 2013-11-25 DIAGNOSIS — Z836 Family history of other diseases of the respiratory system: Secondary | ICD-10-CM | POA: Diagnosis not present

## 2013-11-25 DIAGNOSIS — R7989 Other specified abnormal findings of blood chemistry: Secondary | ICD-10-CM

## 2013-11-25 DIAGNOSIS — R7309 Other abnormal glucose: Secondary | ICD-10-CM | POA: Diagnosis present

## 2013-11-25 DIAGNOSIS — Z87891 Personal history of nicotine dependence: Secondary | ICD-10-CM | POA: Diagnosis not present

## 2013-11-25 DIAGNOSIS — I10 Essential (primary) hypertension: Secondary | ICD-10-CM | POA: Diagnosis present

## 2013-11-25 DIAGNOSIS — G4733 Obstructive sleep apnea (adult) (pediatric): Secondary | ICD-10-CM | POA: Diagnosis not present

## 2013-11-25 DIAGNOSIS — I441 Atrioventricular block, second degree: Secondary | ICD-10-CM | POA: Diagnosis not present

## 2013-11-25 DIAGNOSIS — I671 Cerebral aneurysm, nonruptured: Secondary | ICD-10-CM | POA: Diagnosis not present

## 2013-11-25 DIAGNOSIS — E669 Obesity, unspecified: Secondary | ICD-10-CM | POA: Diagnosis present

## 2013-11-25 DIAGNOSIS — Q245 Malformation of coronary vessels: Secondary | ICD-10-CM | POA: Diagnosis not present

## 2013-11-25 DIAGNOSIS — R778 Other specified abnormalities of plasma proteins: Secondary | ICD-10-CM

## 2013-11-25 DIAGNOSIS — K5730 Diverticulosis of large intestine without perforation or abscess without bleeding: Secondary | ICD-10-CM | POA: Diagnosis present

## 2013-11-25 DIAGNOSIS — I440 Atrioventricular block, first degree: Secondary | ICD-10-CM | POA: Diagnosis present

## 2013-11-25 DIAGNOSIS — M069 Rheumatoid arthritis, unspecified: Secondary | ICD-10-CM | POA: Diagnosis present

## 2013-11-25 DIAGNOSIS — G40909 Epilepsy, unspecified, not intractable, without status epilepticus: Secondary | ICD-10-CM | POA: Diagnosis present

## 2013-11-25 DIAGNOSIS — Z982 Presence of cerebrospinal fluid drainage device: Secondary | ICD-10-CM | POA: Diagnosis not present

## 2013-11-25 DIAGNOSIS — R609 Edema, unspecified: Secondary | ICD-10-CM

## 2013-11-25 DIAGNOSIS — K589 Irritable bowel syndrome without diarrhea: Secondary | ICD-10-CM | POA: Diagnosis present

## 2013-11-25 DIAGNOSIS — R1013 Epigastric pain: Secondary | ICD-10-CM | POA: Diagnosis not present

## 2013-11-25 DIAGNOSIS — R7303 Prediabetes: Secondary | ICD-10-CM | POA: Diagnosis present

## 2013-11-25 DIAGNOSIS — E785 Hyperlipidemia, unspecified: Secondary | ICD-10-CM | POA: Diagnosis present

## 2013-11-25 DIAGNOSIS — Z8249 Family history of ischemic heart disease and other diseases of the circulatory system: Secondary | ICD-10-CM | POA: Diagnosis not present

## 2013-11-25 DIAGNOSIS — K5010 Crohn's disease of large intestine without complications: Secondary | ICD-10-CM | POA: Diagnosis present

## 2013-11-25 DIAGNOSIS — I214 Non-ST elevation (NSTEMI) myocardial infarction: Secondary | ICD-10-CM | POA: Diagnosis not present

## 2013-11-25 DIAGNOSIS — J984 Other disorders of lung: Secondary | ICD-10-CM | POA: Diagnosis not present

## 2013-11-25 LAB — CBC WITH DIFFERENTIAL/PLATELET
Basophils Absolute: 0 10*3/uL (ref 0.0–0.1)
Basophils Relative: 0 % (ref 0–1)
Eosinophils Absolute: 0.1 10*3/uL (ref 0.0–0.7)
Eosinophils Relative: 1 % (ref 0–5)
HCT: 44.8 % (ref 39.0–52.0)
Hemoglobin: 15.5 g/dL (ref 13.0–17.0)
Lymphocytes Relative: 20 % (ref 12–46)
Lymphs Abs: 1.6 10*3/uL (ref 0.7–4.0)
MCH: 32 pg (ref 26.0–34.0)
MCHC: 34.6 g/dL (ref 30.0–36.0)
MCV: 92.6 fL (ref 78.0–100.0)
Monocytes Absolute: 0.7 10*3/uL (ref 0.1–1.0)
Monocytes Relative: 8 % (ref 3–12)
Neutro Abs: 5.5 10*3/uL (ref 1.7–7.7)
Neutrophils Relative %: 70 % (ref 43–77)
Platelets: 152 10*3/uL (ref 150–400)
RBC: 4.84 MIL/uL (ref 4.22–5.81)
RDW: 13.3 % (ref 11.5–15.5)
WBC: 7.9 10*3/uL (ref 4.0–10.5)

## 2013-11-25 LAB — TROPONIN I: Troponin I: 0.74 ng/mL (ref <0.30)

## 2013-11-25 LAB — BASIC METABOLIC PANEL
BUN: 15 mg/dL (ref 6–23)
CO2: 29 mEq/L (ref 19–32)
Calcium: 9.7 mg/dL (ref 8.4–10.5)
Chloride: 99 mEq/L (ref 96–112)
Creatinine, Ser: 1 mg/dL (ref 0.50–1.35)
GFR calc Af Amer: 87 mL/min — ABNORMAL LOW (ref >90)
GFR calc non Af Amer: 75 mL/min — ABNORMAL LOW (ref >90)
Glucose, Bld: 94 mg/dL (ref 70–99)
Potassium: 3.8 mEq/L (ref 3.7–5.3)
Sodium: 142 mEq/L (ref 137–147)

## 2013-11-25 LAB — MRSA PCR SCREENING: MRSA by PCR: NEGATIVE

## 2013-11-25 LAB — D-DIMER, QUANTITATIVE: D-Dimer, Quant: 0.33 ug/mL-FEU (ref 0.00–0.48)

## 2013-11-25 MED ORDER — HYDROCHLOROTHIAZIDE 12.5 MG PO CAPS
12.5000 mg | ORAL_CAPSULE | Freq: Every day | ORAL | Status: DC
  Administered 2013-11-26 – 2013-11-27 (×2): 12.5 mg via ORAL
  Filled 2013-11-25 (×2): qty 1

## 2013-11-25 MED ORDER — PANTOPRAZOLE SODIUM 40 MG PO TBEC
40.0000 mg | DELAYED_RELEASE_TABLET | Freq: Every day | ORAL | Status: DC
  Administered 2013-11-26 – 2013-11-27 (×2): 40 mg via ORAL
  Filled 2013-11-25 (×2): qty 1

## 2013-11-25 MED ORDER — LEVETIRACETAM 750 MG PO TABS
1500.0000 mg | ORAL_TABLET | Freq: Two times a day (BID) | ORAL | Status: DC
  Administered 2013-11-26: 1500 mg via ORAL
  Filled 2013-11-25 (×3): qty 2

## 2013-11-25 MED ORDER — ASPIRIN 81 MG PO CHEW: 324.0000 mg | CHEWABLE_TABLET | ORAL | Status: AC

## 2013-11-25 MED ORDER — VITAMIN B-6 100 MG PO TABS
100.0000 mg | ORAL_TABLET | Freq: Every day | ORAL | Status: DC
  Administered 2013-11-26 – 2013-11-27 (×2): 100 mg via ORAL
  Filled 2013-11-25 (×2): qty 1

## 2013-11-25 MED ORDER — PRAVASTATIN SODIUM 40 MG PO TABS
40.0000 mg | ORAL_TABLET | Freq: Every day | ORAL | Status: DC
  Administered 2013-11-26: 40 mg via ORAL
  Filled 2013-11-25 (×2): qty 1

## 2013-11-25 MED ORDER — NITROGLYCERIN 2 % TD OINT
1.0000 [in_us] | TOPICAL_OINTMENT | Freq: Four times a day (QID) | TRANSDERMAL | Status: DC
  Administered 2013-11-26: 1 [in_us] via TOPICAL
  Filled 2013-11-25: qty 30

## 2013-11-25 MED ORDER — HEPARIN (PORCINE) IN NACL 100-0.45 UNIT/ML-% IJ SOLN
1500.0000 [IU]/h | INTRAMUSCULAR | Status: DC
  Administered 2013-11-26: 1350 [IU]/h via INTRAVENOUS
  Filled 2013-11-25 (×2): qty 250

## 2013-11-25 MED ORDER — ASPIRIN EC 81 MG PO TBEC
81.0000 mg | DELAYED_RELEASE_TABLET | Freq: Every day | ORAL | Status: DC
Start: 2013-11-26 — End: 2013-11-27
  Administered 2013-11-26: 13:00:00 81 mg via ORAL
  Filled 2013-11-25 (×2): qty 1

## 2013-11-25 MED ORDER — NITROGLYCERIN 0.4 MG SL SUBL
0.4000 mg | SUBLINGUAL_TABLET | SUBLINGUAL | Status: DC | PRN
Start: 2013-11-25 — End: 2013-11-27
  Filled 2013-11-25: qty 1

## 2013-11-25 MED ORDER — LOSARTAN POTASSIUM 50 MG PO TABS
100.0000 mg | ORAL_TABLET | Freq: Every day | ORAL | Status: DC
  Filled 2013-11-25: qty 2

## 2013-11-25 MED ORDER — ASPIRIN 81 MG PO CHEW
81.0000 mg | CHEWABLE_TABLET | ORAL | Status: AC
  Administered 2013-11-26: 81 mg via ORAL
  Filled 2013-11-25: qty 1

## 2013-11-25 MED ORDER — SODIUM CHLORIDE 0.9 % IV SOLN
INTRAVENOUS | Status: DC
  Administered 2013-11-26: 04:00:00 via INTRAVENOUS

## 2013-11-25 MED ORDER — SODIUM CHLORIDE 0.9 % IJ SOLN: 3.0000 mL | INTRAMUSCULAR | Status: DC | PRN

## 2013-11-25 MED ORDER — THIAMINE HCL 100 MG PO TABS
100.0000 mg | ORAL_TABLET | Freq: Every day | ORAL | Status: DC
  Administered 2013-11-26 – 2013-11-27 (×2): 100 mg via ORAL
  Filled 2013-11-25 (×2): qty 1

## 2013-11-25 MED ORDER — LACOSAMIDE 100 MG PO TABS: 200.0000 mg | ORAL_TABLET | Freq: Two times a day (BID) | ORAL | Status: DC

## 2013-11-25 MED ORDER — ASPIRIN 300 MG RE SUPP
300.0000 mg | RECTAL | Status: AC
  Filled 2013-11-25: qty 1

## 2013-11-25 MED ORDER — NON FORMULARY: 40.0000 mg | Freq: Every day | Status: DC

## 2013-11-25 MED ORDER — AMLODIPINE BESYLATE 2.5 MG PO TABS
2.5000 mg | ORAL_TABLET | Freq: Every day | ORAL | Status: DC
  Administered 2013-11-26 – 2013-11-27 (×2): 2.5 mg via ORAL
  Filled 2013-11-25 (×2): qty 1

## 2013-11-25 MED ORDER — LACOSAMIDE 100 MG PO TABS
200.0000 mg | ORAL_TABLET | Freq: Two times a day (BID) | ORAL | Status: DC
  Filled 2013-11-25: qty 2

## 2013-11-25 MED ORDER — SODIUM CHLORIDE 0.9 % IJ SOLN
3.0000 mL | Freq: Two times a day (BID) | INTRAMUSCULAR | Status: DC
  Administered 2013-11-25: 3 mL via INTRAVENOUS

## 2013-11-25 MED ORDER — HEPARIN BOLUS VIA INFUSION
3000.0000 [IU] | Freq: Once | INTRAVENOUS | Status: AC
  Administered 2013-11-25: 3000 [IU] via INTRAVENOUS
  Filled 2013-11-25: qty 3000

## 2013-11-25 MED ORDER — LACOSAMIDE 200 MG PO TABS
200.0000 mg | ORAL_TABLET | Freq: Two times a day (BID) | ORAL | Status: DC
  Administered 2013-11-26: 200 mg via ORAL
  Filled 2013-11-25 (×3): qty 4

## 2013-11-25 MED ORDER — SODIUM CHLORIDE 0.9 % IV SOLN: 250.0000 mL | INTRAVENOUS | Status: DC | PRN

## 2013-11-25 MED ORDER — FLUTICASONE PROPIONATE 50 MCG/ACT NA SUSP
2.0000 | Freq: Every day | NASAL | Status: DC
  Administered 2013-11-26 – 2013-11-27 (×2): 2 via NASAL
  Filled 2013-11-25: qty 16

## 2013-11-25 MED ORDER — NITROGLYCERIN 0.4 MG SL SUBL: 0.4000 mg | SUBLINGUAL_TABLET | SUBLINGUAL | Status: DC | PRN

## 2013-11-25 MED ORDER — ASPIRIN 81 MG PO CHEW
324.0000 mg | CHEWABLE_TABLET | Freq: Once | ORAL | Status: AC
  Administered 2013-11-25: 324 mg via ORAL
  Filled 2013-11-25: qty 4

## 2013-11-25 MED ORDER — TAMSULOSIN HCL 0.4 MG PO CAPS
0.4000 mg | ORAL_CAPSULE | Freq: Every day | ORAL | Status: DC
  Filled 2013-11-25: qty 1

## 2013-11-25 NOTE — ED Notes (Signed)
Chest pain for an hour.

## 2013-11-25 NOTE — ED Notes (Signed)
Report called to RN at [facility].  For room 2c04

## 2013-11-25 NOTE — ED Provider Notes (Signed)
CSN: [id number]     Arrival date & time 11/25/13  1743 History  This chart was scribed for Jeff Hamburger, MD by Ladene Artist, ED Scribe. The patient was seen in room MH02/MH02. Patient's care was started at 6:03 PM.  Chief Complaint  Patient presents with  . Chest Pain   The history is provided by the patient. No language interpreter was used.   HPI Comments: Jeff Weaver is a 68 y.o. male who presents to the Emergency Department complaining of intermittent, pinching, squeezing chest pain over the past month, worsening today. Pt states that he was walking in the store when he experienced the pain. He reports sitting down after he experienced the pain. Pt states that he experiences chest pain x 2-3 daily that typically lasts 10-15 minutes but today's episode lasted for a few hours. He states that the chest pain radiates from the L side to the R side. Pt reports sitting down reading when he experiences the pain. Pt reports dry throat, sore throat onset this morning, R leg swelling and bilateral leg soreness with walking. Pt sleeps with cpap at night. He denies nausea, HA, back pain, arm pain, pain with breathing deeply. 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   Past Medical History  Diagnosis Date  . Hyperlipidemia   . HTN (hypertension)   . Enteritis (regional)     Dr Olevia Perches  . Rheumatoid arthritis(714.0)     dxed in [employer] in 1980s  . Sleep apnea   . Cerebral aneurysm   . CVA (cerebral infarction)   . Seizures   . Diverticulosis   . GI bleed   . IBS (irritable bowel syndrome)   . Regional enteritis of large intestine since 1978   Past Surgical History  Procedure Laterality Date  . Nose surgery    . Colonoscopy w/ polypectomy  1978    negative 2009, Dr Olevia Perches. Due 2014  . Shoulder surgery  1997  . Hernia repair    . Tonsillectomy and adenoidectomy    . Craniotomy  02/02/12    Dr Harvel Ricks, [place] of aneurysm  . Cns shunt  02/23/12  . Craniotomy   02-02-12    left pterional craniotomy for clipping complex anterior communicating artery aneurysm   . Ventriculoperitoneal shunt  02-23-12    INSERTION OF RIGHT FRONTAL VENTRICULOPERITONEAL SHUNT WITH A CODMAN HAKIM PROGRAMMABLE VALVE   Family History  Problem Relation Age of Onset  . COPD Father   . Coronary artery disease Father     MI in 60s  . Hepatitis Mother   . Diabetes Sister   . Dementia Sister 76  . Diabetes Brother   . Colon cancer Neg Hx    History  Substance Use Topics  . Smoking status: Former Smoker    Quit date: 07/04/2009  . Smokeless tobacco: Never Used  . Alcohol Use: Yes     Comment: Very Infrequently     Review of Systems  HENT: Positive for sore throat.   Cardiovascular: Positive for chest pain and leg swelling.  Gastrointestinal: Negative for nausea.  Musculoskeletal: Positive for myalgias. Negative for back pain.  Neurological: Negative for headaches.  All other systems reviewed and are negative.  Allergies  Lisinopril; Crestor; Pravastatin; and Simvastatin  Home Medications   Prior to Admission medications   Medication Sig Start Date End Date Taking? Authorizing Provider  amLODipine (NORVASC) 2.5 MG tablet TAKE ONE TABLET BY MOUTH EVERY TWELVE HOURS  Hendricks Limes, MD  fluticasone (FLONASE) 50 MCG/ACT nasal spray INSTILL 1 SPRAY INTO THE NOSE TWICE DAILY AS NEEDED RHINITIS    Hendricks Limes, MD  hydrochlorothiazide (MICROZIDE) 12.5 MG capsule Take 12.5 mg by mouth daily.    Historical Provider, MD  IRON, FERROUS GLUCONATE, PO Take 65 mg by mouth.    Historical Provider, MD  Lacosamide (VIMPAT) 100 MG TABS Take by mouth. 2 by mouth two times daily    Historical Provider, MD  levETIRAcetam (KEPPRA) 500 MG tablet Take 500 mg by mouth. 3 by mouth in the am, 4 by mouth in the pm    Historical Provider, MD  losartan (COZAAR) 100 MG tablet TAKE 1/2TABLET BY MOUTH EVERY DAY 08/09/13   Hendricks Limes, MD  NON FORMULARY CPAP Machine: authorized by [facility]  in [facility]    Historical Provider, MD  omeprazole (PRILOSEC) 20 MG capsule TAKE ONE CAPSULE BY MOUTH ONE TIME DAILY  06/10/13   Hendricks Limes, MD  pravastatin (PRAVACHOL) 40 MG tablet TAKE ONE TABLET BY MOUTH NIGHTLY AT BEDTIME 06/24/13   Hendricks Limes, MD  pyridoxine (B-6) 100 MG tablet Take 100 mg by mouth daily.    Historical Provider, MD  Tamsulosin HCl (FLOMAX) 0.4 MG CAPS Take 0.4 mg by mouth. 2 BY MOUTH ONCE DAILY    Historical Provider, MD  thiamine 100 MG tablet Take 100 mg by mouth daily.    Historical Provider, MD   Triage Vitals: BP 134/77  Pulse 63  Temp(Src) 98.6 F (37 C) (Oral)  Resp 20  Ht 5' 11"  (1.803 m)  Wt 258 lb (117.028 kg)  BMI 36.00 kg/m2  SpO2 97% Physical Exam  Nursing note and vitals reviewed. Constitutional: He is oriented to person, place, and time. He appears well-developed and well-nourished. No distress.  HENT:  Head: Normocephalic and atraumatic.  Eyes: EOM are normal.  Neck: Neck supple.  Pulmonary/Chest: Effort normal. No respiratory distress. He exhibits no tenderness.  Musculoskeletal: Normal range of motion.       Right lower leg: He exhibits tenderness (mild) and swelling (diffused calf swelling).  Neurological: He is alert and oriented to person, place, and time.  Skin: Skin is warm and dry.  Psychiatric: He has a normal mood and affect. His behavior is normal.   ED Course  Procedures (including critical care time) DIAGNOSTIC STUDIES: Oxygen Saturation is 97% on RA, normal by my interpretation.    COORDINATION OF CARE: 6:15 PM Discussed treatment plan with pt at bedside and pt agreed to plan.  Labs Review Labs Reviewed  BASIC METABOLIC PANEL - Abnormal; Notable for the following:    GFR calc non Af Amer 75 (*)    GFR calc Af Amer 87 (*)    All other components within normal limits  TROPONIN I - Abnormal; Notable for the following:    Troponin I 0.74 (*)    All other components within normal limits  CBC WITH DIFFERENTIAL   D-DIMER, QUANTITATIVE   Imaging Review Dg Chest 2 View  11/25/2013   CLINICAL DATA:  68 year old male with chest pain  EXAM: CHEST  2 VIEW  COMPARISON:  08/03/2016 and prior chest radiographs  FINDINGS: The cardiomediastinal silhouette is unremarkable.  Mild left basilar scarring again noted.  Catheter overlying the left chest is unchanged.  There is no evidence of focal airspace disease, pulmonary edema, suspicious pulmonary nodule/mass, pleural effusion, or pneumothorax. No acute bony abnormalities are identified.  IMPRESSION: No evidence of acute cardiopulmonary  disease.   Electronically Signed   By: Hassan Rowan M.D.   On: 11/25/2013 19:12   US Venous Img Lower Unilateral Right  11/25/2013   CLINICAL DATA:  68 year old male with right lower extremity pain and swelling.  EXAM: RIGHT LOWER EXTREMITY VENOUS DOPPLER ULTRASOUND  TECHNIQUE: Gray-scale sonography with graded compression, as well as color Doppler and duplex ultrasound were performed to evaluate the lower extremity deep venous systems from the level of the common femoral vein and including the common femoral, femoral, profunda femoral, popliteal and calf veins including the posterior tibial, peroneal and gastrocnemius veins when visible. The superficial great saphenous vein was also interrogated. Spectral Doppler was utilized to evaluate flow at rest and with distal augmentation maneuvers in the common femoral, femoral and popliteal veins.  COMPARISON:  None.  FINDINGS: Common Femoral Vein: No evidence of thrombus. Normal compressibility, respiratory phasicity and response to augmentation.  Saphenofemoral Junction: No evidence of thrombus. Normal compressibility and flow on color Doppler imaging.  Profunda Femoral Vein: No evidence of thrombus. Normal compressibility and flow on color Doppler imaging.  Femoral Vein: No evidence of thrombus. Normal compressibility, respiratory phasicity and response to augmentation.  Popliteal Vein: No evidence of  thrombus. Normal compressibility, respiratory phasicity and response to augmentation.  Calf Veins: There is no evidence thrombus within the posterior tibial vein. The peroneal vein is not visualized.  Superficial Great Saphenous Vein: No evidence of thrombus. Normal compressibility and flow on color Doppler imaging.  Venous Reflux:  None.  Other Findings:  None.  IMPRESSION: No evidence of deep venous thrombosis.  Peroneal vein not well visualized.   Electronically Signed   By: Hassan Rowan M.D.   On: 11/25/2013 19:42     EKG Interpretation   Date/Time:  Monday Nov 25 2013 17:52:56 EDT Ventricular Rate:  59 PR Interval:  254 QRS Duration: 78 QT Interval:  406 QTC Calculation: 401 R Axis:   7 Text Interpretation:  Sinus bradycardia with 1st degree A-V block Minimal  voltage criteria for LVH, may be normal variant Borderline ECG ST changes  have resolved Confirmed by Charmika Macdonnell  MD, [place] ([id number]) on 11/25/2013 5:58:13  PM      CRITICAL CARE Performed by: Jeff Weaver   Total critical care time: 30 minutes  Critical care time was exclusive of separately billable procedures and treating other patients.  Critical care was necessary to treat or prevent imminent or life-threatening deterioration.  Critical care was time spent personally by me on the following activities: development of treatment plan with patient and/or surrogate as well as nursing, discussions with consultants, evaluation of patient's response to treatment, examination of patient, obtaining history from patient or surrogate, ordering and performing treatments and interventions, ordering and review of laboratory studies, ordering and review of radiographic studies, pulse oximetry and re-evaluation of patient's condition.  MDM   Final diagnoses:  NSTEMI (non-ST elevated myocardial infarction)    Patient with minimal pain here, relieved with ASA. EKG shows no acute ischemia. Troponin elevated to 0.74. Discussed with Dr.  Aundra Dubin, who accepts patient to [facility]. Given his history of cerebral aneurysm clipping (8/13), family is hesitant about heparin. I discussed risks/benefits with family at length, they do not feel comfortable starting this until hearing neurosurgery opinion. I discussed that there is a risk of worsening MI (symptom free now) by delaying, which they understand. I called PAL line at [facility] and was unable to reach neurosurgery before patient was transported by care-link. I did get  to speak to Dr. Rogers Blocker of [facility], who reviewed his case and feels it is ok to give anti-coagulation in this scenario. I then called Dr. Haroldine Laws, the cardiologist currently taking care of patient, who acknowledges this and will relate this to family.  I personally performed the services described in this documentation, which was scribed in my presence. The recorded information has been reviewed and is accurate.    Jeff Hamburger, MD 11/25/13 2134

## 2013-11-25 NOTE — ED Notes (Signed)
Made Dr. Regenia Skeeter aware of Pt. Troponin being 0.74

## 2013-11-25 NOTE — H&P (Signed)
PCP: Linna Darner   HPI:  68 y/o male with obesity, HTN, borderline HL, prediabetic and cerebral aneurysm s/p clipping at [facility] in 8/13 c/b short-term memory loss, CVA, cerebral hemorrhage and seizure d/o s/p VP shunt. Transferred from [place] for NSTEMI. Denies any known heart disease.  Fairly active at home. Walks 2-3 miles every night without problem.   Tonight walking through store with family and had progressive CP. + daiphoresis. No SOB, N/V. Drove to [place]. Got ASA and pain resolved. Troponin 0.74. Has had severe smaller CPs over past few months but always resolved quickly. Had normal stress test within last year but no cath. 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   ECG: SB 59. + LVH No ST-T wave abnormalities.   Review of Systems:     Cardiac Review of Systems: {Y] = yes [ ]  = no  Chest Pain [ y   ]  Resting SOB [   ] Exertional SOB  [  ]  Orthopnea [  ]   Pedal Edema [   ]    Palpitations [  ] Syncope  [  ]   Presyncope [   ]  General Review of Systems: [Y] = yes [  ]=no Constitional: recent weight change [  ]; anorexia [  ]; fatigue [  ]; nausea [  ]; night sweats [  ]; fever [  ]; or chills [  ];                                                                                                                                          Dental: poor dentition[  ];   Eye : blurred vision [  ]; diplopia [   ]; vision changes [  ];  Amaurosis fugax[  ]; Resp: cough [  ];  wheezing[  ];  hemoptysis[  ]; shortness of breath[  ]; paroxysmal nocturnal dyspnea[  ]; dyspnea on exertion[  ]; or orthopnea[  ];  GI:  gallstones[  ], vomiting[  ];  dysphagia[  ]; melena[  ];  hematochezia [  ]; heartburn[  ];    GU: kidney stones [  ]; hematuria[  ];   dysuria [  ];  nocturia[  ];  history of     obstruction [  ];                 Skin: rash, swelling[  ];, hair loss[  ];  peripheral edema[  y];  or itching[  ]; Musculosketetal: myalgias[  ];  joint swelling[   ];  joint erythema[  ];  joint pain[ y ];  back pain[  ];  Heme/Lymph: bruising[  ];  bleeding[  ];  anemia[  ];  Neuro: TIA[  ];  headaches[  ];  stroke[ y ];  vertigo[  ];  seizures[ y ];   paresthesias[  ];  difficulty walking[  ];  Psych:depression[  ]; anxiety[  ];  Endocrine: diabetes[  ];  thyroid dysfunction[  ];  Other:  Past Medical History  Diagnosis Date  . Hyperlipidemia   . HTN (hypertension)   . Enteritis (regional)     Dr Olevia Perches  . Rheumatoid arthritis(714.0)     dxed in [employer] in 1980s  . Sleep apnea   . Cerebral aneurysm   . CVA (cerebral infarction)   . Seizures   . Diverticulosis   . GI bleed   . IBS (irritable bowel syndrome)   . Regional enteritis of large intestine since 1978    Medications Prior to Admission  Medication Sig Dispense Refill  . amLODipine (NORVASC) 2.5 MG tablet TAKE ONE TABLET BY MOUTH EVERY TWELVE HOURS   60 tablet  2  . fluticasone (FLONASE) 50 MCG/ACT nasal spray INSTILL 1 SPRAY INTO THE NOSE TWICE DAILY AS NEEDED RHINITIS  16 g  6  . hydrochlorothiazide (MICROZIDE) 12.5 MG capsule Take 12.5 mg by mouth daily.      . IRON, FERROUS GLUCONATE, PO Take 65 mg by mouth.      . Lacosamide (VIMPAT) 100 MG TABS Take by mouth. 2 by mouth two times daily      . levETIRAcetam (KEPPRA) 500 MG tablet Take 500 mg by mouth. 3 by mouth in the am, 4 by mouth in the pm      . losartan (COZAAR) 100 MG tablet TAKE 1/2TABLET BY MOUTH EVERY DAY  45 tablet  1  . NON FORMULARY CPAP Machine: authorized by [place] in [place]      . omeprazole (PRILOSEC) 20 MG capsule TAKE ONE CAPSULE BY MOUTH ONE TIME DAILY   30 capsule  5  . pravastatin (PRAVACHOL) 40 MG tablet TAKE ONE TABLET BY MOUTH NIGHTLY AT BEDTIME  90 tablet  1  . pyridoxine (B-6) 100 MG tablet Take 100 mg by mouth daily.      . Tamsulosin HCl (FLOMAX) 0.4 MG CAPS Take 0.4 mg by mouth. 2 BY MOUTH ONCE DAILY      . thiamine 100 MG tablet Take 100 mg by mouth daily.         Allergies  Allergen Reactions    . Lisinopril     Cough   . Crestor [Rosuvastatin]     Leg pain mainly in knees   . Pravastatin     Rxed by [facility] 2009; "myositis" Ok to take per pt's wife/Dr. Linna Darner  . Simvastatin     2007: nausea & vomiting ; [facility] ( he does not remember such)    History   Social History  . Marital Status: Married    Spouse Name: N/A    Number of Children: 1  . Years of Education: N/A   Occupational History  . retired    Social History Main Topics  . Smoking status: Former Smoker    Quit date: 07/04/2009  . Smokeless tobacco: Never Used  . Alcohol Use: Yes     Comment: Very Infrequently   . Drug Use: No  . Sexual Activity: Not on file   Other Topics Concern  . Not on file   Social History Narrative  . No narrative on file    Family History  Problem Relation Age of Onset  . COPD Father   . Coronary artery disease Father     MI in 11s  . Hepatitis Mother   . Diabetes  Sister   . Dementia Sister 56  . Diabetes Brother   . Colon cancer Neg Hx     PHYSICAL EXAM: Filed Vitals:   11/25/13 2000  BP:   Pulse: 60  Temp:   Resp:   BP 134/77 sats 95%  General:  Well appearing. No respiratory difficulty HEENT: normal. VP shunt scar Neck: supple. no JVD. Carotids 2+ bilat; no bruits. No lymphadenopathy or thryomegaly appreciated. Cor: PMI nondisplaced. Regular rate & rhythm. No rubs, gallops or murmurs. Lungs: clear Abdomen: obese soft, nontender, nondistended. No hepatosplenomegaly. No bruits or masses. Good bowel sounds. Extremities: no cyanosis, clubbing, rash, 1+ edema Neuro: alert & oriented x 3, cranial nerves grossly intact. moves all 4 extremities w/o difficulty. Affect pleasant.    Results for orders placed during the hospital encounter of 11/25/13 (from the past 24 hour(s))  CBC WITH DIFFERENTIAL     Status: None   Collection Time    11/25/13  6:25 PM      Result Value Ref Range   WBC 7.9  4.0 - 10.5 K/uL   RBC 4.84  4.22 - 5.81 MIL/uL   Hemoglobin 15.5  13.0 -  17.0 g/dL   HCT 44.8  39.0 - 52.0 %   MCV 92.6  78.0 - 100.0 fL   MCH 32.0  26.0 - 34.0 pg   MCHC 34.6  30.0 - 36.0 g/dL   RDW 13.3  11.5 - 15.5 %   Platelets 152  150 - 400 K/uL   Neutrophils Relative % 70  43 - 77 %   Neutro Abs 5.5  1.7 - 7.7 K/uL   Lymphocytes Relative 20  12 - 46 %   Lymphs Abs 1.6  0.7 - 4.0 K/uL   Monocytes Relative 8  3 - 12 %   Monocytes Absolute 0.7  0.1 - 1.0 K/uL   Eosinophils Relative 1  0 - 5 %   Eosinophils Absolute 0.1  0.0 - 0.7 K/uL   Basophils Relative 0  0 - 1 %   Basophils Absolute 0.0  0.0 - 0.1 K/uL  BASIC METABOLIC PANEL     Status: Abnormal   Collection Time    11/25/13  6:25 PM      Result Value Ref Range   Sodium 142  137 - 147 mEq/L   Potassium 3.8  3.7 - 5.3 mEq/L   Chloride 99  96 - 112 mEq/L   CO2 29  19 - 32 mEq/L   Glucose, Bld 94  70 - 99 mg/dL   BUN 15  6 - 23 mg/dL   Creatinine, Ser 1.00  0.50 - 1.35 mg/dL   Calcium 9.7  8.4 - 10.5 mg/dL   GFR calc non Af Amer 75 (*) >90 mL/min   GFR calc Af Amer 87 (*) >90 mL/min  TROPONIN I     Status: Abnormal   Collection Time    11/25/13  6:25 PM      Result Value Ref Range   Troponin I 0.74 (*) <0.30 ng/mL  D-DIMER, QUANTITATIVE     Status: None   Collection Time    11/25/13  6:25 PM      Result Value Ref Range   D-Dimer, Quant 0.33  0.00 - 0.48 ug/mL-FEU   Dg Chest 2 View  11/25/2013   CLINICAL DATA:  68 year old male with chest pain  EXAM: CHEST  2 VIEW  COMPARISON:  08/03/2016 and prior chest radiographs  FINDINGS: The cardiomediastinal silhouette is unremarkable.  Mild  left basilar scarring again noted.  Catheter overlying the left chest is unchanged.  There is no evidence of focal airspace disease, pulmonary edema, suspicious pulmonary nodule/mass, pleural effusion, or pneumothorax. No acute bony abnormalities are identified.  IMPRESSION: No evidence of acute cardiopulmonary disease.   Electronically Signed   By: Hassan Rowan M.D.   On: 11/25/2013 19:12   US Venous Img Lower  Unilateral Right  11/25/2013   CLINICAL DATA:  68 year old male with right lower extremity pain and swelling.  EXAM: RIGHT LOWER EXTREMITY VENOUS DOPPLER ULTRASOUND  TECHNIQUE: Gray-scale sonography with graded compression, as well as color Doppler and duplex ultrasound were performed to evaluate the lower extremity deep venous systems from the level of the common femoral vein and including the common femoral, femoral, profunda femoral, popliteal and calf veins including the posterior tibial, peroneal and gastrocnemius veins when visible. The superficial great saphenous vein was also interrogated. Spectral Doppler was utilized to evaluate flow at rest and with distal augmentation maneuvers in the common femoral, femoral and popliteal veins.  COMPARISON:  None.  FINDINGS: Common Femoral Vein: No evidence of thrombus. Normal compressibility, respiratory phasicity and response to augmentation.  Saphenofemoral Junction: No evidence of thrombus. Normal compressibility and flow on color Doppler imaging.  Profunda Femoral Vein: No evidence of thrombus. Normal compressibility and flow on color Doppler imaging.  Femoral Vein: No evidence of thrombus. Normal compressibility, respiratory phasicity and response to augmentation.  Popliteal Vein: No evidence of thrombus. Normal compressibility, respiratory phasicity and response to augmentation.  Calf Veins: There is no evidence thrombus within the posterior tibial vein. The peroneal vein is not visualized.  Superficial Great Saphenous Vein: No evidence of thrombus. Normal compressibility and flow on color Doppler imaging.  Venous Reflux:  None.  Other Findings:  None.  IMPRESSION: No evidence of deep venous thrombosis.  Peroneal vein not well visualized.   Electronically Signed   By: Hassan Rowan M.D.   On: 11/25/2013 19:42     ASSESSMENT: 1. NSTEMI 2. HTN 3. HL 4. Prediabetes 5. OSA on CPAP 6. Cerebral aneurysm s/p clipping at [facility] at 8/13 c/b short-term memory loss,  CVA, cerebral hemorrhage and seizure d/o s/p VP shunt  PLAN/DISCUSSION:  Presentation c/w NSTEMI. Will treat ASA, heparin, statin and NTG. HR too low for statin. Discussed need for cath and will schedule in am. Family concerned about risk of intracranial bleeding with heparin. Dr. Regenia Skeeter spoke with Dr. Rogers Blocker in [facility] who reviewed films and felt heparin was ok. I discussed fact that risk of bleeding likely very low but not zero.   Shaune Pascal Gethsemane Fischler,MD 9:31 PM

## 2013-11-25 NOTE — Progress Notes (Signed)
ANTICOAGULATION CONSULT NOTE - Initial Consult  Pharmacy Consult for Heparin Indication: chest pain/ACS  Allergies  Allergen Reactions  . Lisinopril     Cough   . Crestor [Rosuvastatin]     Leg pain mainly in knees   . Pravastatin     Rxed by [facility] 2009; "myositis" Ok to take per pt's wife/Dr. Linna Darner  . Simvastatin     2007: nausea & vomiting ; [facility] ( he does not remember such)    Patient Measurements: Height: 5' 11"  (180.3 cm) Weight: 252 lb 6.8 oz (114.5 kg) IBW/kg (Calculated) : 75.3 Heparin Dosing Weight: 86 kg  Vital Signs: Temp: 98.1 F (36.7 C) (05/25 2100) Temp src: Oral (05/25 2100) BP: 125/105 mmHg (05/25 2120) Pulse Rate: 59 (05/25 2120)  Labs:  Recent Labs  11/25/13 1825  HGB 15.5  HCT 44.8  PLT 152  CREATININE 1.00  TROPONINI 0.74*   Estimated Creatinine Clearance: 89.7 ml/min (by C-G formula based on Cr of 1).  Medical History: Past Medical History  Diagnosis Date  . Hyperlipidemia   . HTN (hypertension)   . Enteritis (regional)     Dr Olevia Perches  . Rheumatoid arthritis(714.0)     dxed in [employer] in 1980s  . Sleep apnea   . Cerebral aneurysm   . CVA (cerebral infarction)   . Seizures   . Diverticulosis   . GI bleed   . IBS (irritable bowel syndrome)   . Regional enteritis of large intestine since 1978   Assessment: 68 yo male admitted with chest pain.  He has an elevated troponin and we have been asked to dose his IV heparin.  He has a PMH of cerebral hemorrhage and has had a VP shunt.  He currently is without noted bleeding complications.  His CBC and platelets are within normal range.  He was on no anticoagulation meds PTA.  Goal of Therapy:  Heparin level 0.3-0.5 (keep lower end per MD request) Monitor platelets by anticoagulation protocol: Yes   Plan:  Begin IV Heparin with a 3000 unit bolus Start IV Heparin continuous infusion at 1350 units/hr Obtain a heparin level 8 hours after starting Daily CBC and heparin levels  Rober Minion, PharmD., MS Clinical Pharmacist Pager:  [phone] Thank you for allowing pharmacy to be part of this patients care team. 11/25/2013,10:08 PM

## 2013-11-26 ENCOUNTER — Encounter (HOSPITAL_COMMUNITY)
Admission: EM | Disposition: A | Discharge status: Home / Self Care | Payer: Self-pay | Source: Home / Self Care | Attending: Internal Medicine

## 2013-11-26 DIAGNOSIS — I359 Nonrheumatic aortic valve disorder, unspecified: Secondary | ICD-10-CM

## 2013-11-26 DIAGNOSIS — I214 Non-ST elevation (NSTEMI) myocardial infarction: Secondary | ICD-10-CM | POA: Diagnosis not present

## 2013-11-26 HISTORY — PX: LEFT HEART CATHETERIZATION WITH CORONARY ANGIOGRAM: SHX5451

## 2013-11-26 LAB — CBC
HCT: 42.4 % (ref 39.0–52.0)
Hemoglobin: 14.4 g/dL (ref 13.0–17.0)
MCH: 31.2 pg (ref 26.0–34.0)
MCHC: 34 g/dL (ref 30.0–36.0)
MCV: 92 fL (ref 78.0–100.0)
Platelets: 155 10*3/uL (ref 150–400)
RBC: 4.61 MIL/uL (ref 4.22–5.81)
RDW: 13.4 % (ref 11.5–15.5)
WBC: 7.7 10*3/uL (ref 4.0–10.5)

## 2013-11-26 LAB — PROTIME-INR
INR: 1 (ref 0.00–1.49)
Prothrombin Time: 13 seconds (ref 11.6–15.2)

## 2013-11-26 LAB — HEMOGLOBIN A1C
Hgb A1c MFr Bld: 6.4 % — ABNORMAL HIGH (ref <5.7)
Mean Plasma Glucose: 137 mg/dL — ABNORMAL HIGH (ref <117)

## 2013-11-26 LAB — BASIC METABOLIC PANEL
BUN: 14 mg/dL (ref 6–23)
CO2: 27 mEq/L (ref 19–32)
Calcium: 8.8 mg/dL (ref 8.4–10.5)
Chloride: 101 mEq/L (ref 96–112)
Creatinine, Ser: 0.83 mg/dL (ref 0.50–1.35)
GFR calc Af Amer: >90 mL/min (ref >90)
GFR calc non Af Amer: 88 mL/min — ABNORMAL LOW (ref >90)
Glucose, Bld: 117 mg/dL — ABNORMAL HIGH (ref 70–99)
Potassium: 3.7 mEq/L (ref 3.7–5.3)
Sodium: 138 mEq/L (ref 137–147)

## 2013-11-26 LAB — LIPID PANEL
Cholesterol: 144 mg/dL (ref 0–200)
HDL: 36 mg/dL — ABNORMAL LOW (ref >39)
LDL Cholesterol: 67 mg/dL (ref 0–99)
Total CHOL/HDL Ratio: 4 RATIO
Triglycerides: 203 mg/dL — ABNORMAL HIGH (ref <150)
VLDL: 41 mg/dL — ABNORMAL HIGH (ref 0–40)

## 2013-11-26 LAB — TROPONIN I
Troponin I: <0.3 ng/mL (ref <0.30)
Troponin I: <0.3 ng/mL (ref <0.30)
Troponin I: <0.3 ng/mL (ref <0.30)

## 2013-11-26 LAB — HEPARIN LEVEL (UNFRACTIONATED): Heparin Unfractionated: 0.25 IU/mL — ABNORMAL LOW (ref 0.30–0.70)

## 2013-11-26 LAB — SEDIMENTATION RATE: Sed Rate: 3 mm/hr (ref 0–16)

## 2013-11-26 SURGERY — LEFT HEART CATHETERIZATION WITH CORONARY ANGIOGRAM
Anesthesia: LOCAL

## 2013-11-26 MED ORDER — HEPARIN (PORCINE) IN NACL 2-0.9 UNIT/ML-% IJ SOLN
INTRAMUSCULAR | Status: AC
Start: 2013-11-26 — End: 2013-11-26
  Filled 2013-11-26: qty 1500

## 2013-11-26 MED ORDER — LOSARTAN POTASSIUM 50 MG PO TABS
50.0000 mg | ORAL_TABLET | Freq: Every day | ORAL | Status: DC
  Administered 2013-11-26 – 2013-11-27 (×3): 50 mg via ORAL
  Filled 2013-11-26: qty 1

## 2013-11-26 MED ORDER — FENTANYL CITRATE 0.05 MG/ML IJ SOLN
INTRAMUSCULAR | Status: AC
  Filled 2013-11-26: qty 2

## 2013-11-26 MED ORDER — LEVETIRACETAM 500 MG PO TABS
500.0000 mg | ORAL_TABLET | Freq: Two times a day (BID) | ORAL | Status: DC
  Administered 2013-11-26 – 2013-11-27 (×2): 500 mg via ORAL
  Filled 2013-11-26 (×4): qty 1

## 2013-11-26 MED ORDER — SODIUM CHLORIDE 0.9 % IV SOLN: 250.0000 mL | INTRAVENOUS | Status: DC | PRN

## 2013-11-26 MED ORDER — SODIUM CHLORIDE 0.9 % IJ SOLN: 3.0000 mL | Freq: Two times a day (BID) | INTRAMUSCULAR | Status: DC

## 2013-11-26 MED ORDER — HEPARIN SODIUM (PORCINE) 1000 UNIT/ML IJ SOLN
INTRAMUSCULAR | Status: AC
  Filled 2013-11-26: qty 1

## 2013-11-26 MED ORDER — LACOSAMIDE 50 MG PO TABS
300.0000 mg | ORAL_TABLET | Freq: Every day | ORAL | Status: DC
  Administered 2013-11-26: 21:00:00 300 mg via ORAL

## 2013-11-26 MED ORDER — ACETAMINOPHEN 325 MG PO TABS
ORAL_TABLET | ORAL | Status: AC
  Filled 2013-11-26: qty 2

## 2013-11-26 MED ORDER — LEVETIRACETAM 500 MG PO TABS
500.0000 mg | ORAL_TABLET | Freq: Two times a day (BID) | ORAL | Status: DC
  Filled 2013-11-26 (×3): qty 1

## 2013-11-26 MED ORDER — MIDAZOLAM HCL 2 MG/2ML IJ SOLN
INTRAMUSCULAR | Status: AC
  Filled 2013-11-26: qty 2

## 2013-11-26 MED ORDER — SODIUM CHLORIDE 0.9 % IJ SOLN: 3.0000 mL | INTRAMUSCULAR | Status: DC | PRN

## 2013-11-26 MED ORDER — LEVETIRACETAM 500 MG PO TABS
500.0000 mg | ORAL_TABLET | Freq: Two times a day (BID) | ORAL | Status: DC
  Filled 2013-11-26: qty 1

## 2013-11-26 MED ORDER — TAMSULOSIN HCL 0.4 MG PO CAPS
0.8000 mg | ORAL_CAPSULE | Freq: Every day | ORAL | Status: DC
  Administered 2013-11-26: 0.8 mg via ORAL
  Filled 2013-11-26 (×2): qty 2

## 2013-11-26 MED ORDER — DIPHENHYDRAMINE HCL 50 MG/ML IJ SOLN
INTRAMUSCULAR | Status: AC
  Filled 2013-11-26: qty 1

## 2013-11-26 MED ORDER — NITROGLYCERIN 0.2 MG/ML ON CALL CATH LAB
INTRAVENOUS | Status: AC
  Filled 2013-11-26: qty 1

## 2013-11-26 MED ORDER — LIDOCAINE HCL (PF) 1 % IJ SOLN
INTRAMUSCULAR | Status: AC
  Filled 2013-11-26: qty 30

## 2013-11-26 MED ORDER — ACETAMINOPHEN 325 MG PO TABS
650.0000 mg | ORAL_TABLET | ORAL | Status: DC | PRN
  Administered 2013-11-26 (×2): 650 mg via ORAL
  Filled 2013-11-26 (×2): qty 2

## 2013-11-26 MED ORDER — SODIUM CHLORIDE 0.9 % IV SOLN: 1.0000 mL/kg/h | INTRAVENOUS | Status: AC

## 2013-11-26 MED ORDER — LACOSAMIDE 200 MG PO TABS
200.0000 mg | ORAL_TABLET | Freq: Every day | ORAL | Status: DC
  Administered 2013-11-27: 200 mg via ORAL
  Filled 2013-11-26 (×3): qty 1

## 2013-11-26 MED ORDER — VERAPAMIL HCL 2.5 MG/ML IV SOLN
INTRAVENOUS | Status: AC
  Filled 2013-11-26: qty 2

## 2013-11-26 NOTE — Progress Notes (Signed)
Utilization Review Completed.  

## 2013-11-26 NOTE — Progress Notes (Signed)
  Echocardiogram 2D Echocardiogram has been performed.  [place] 11/26/2013, 3:07 PM

## 2013-11-26 NOTE — Progress Notes (Signed)
ANTICOAGULATION CONSULT NOTE - Follow Up Consult  Pharmacy Consult for heparin  Indication: chest pain/ACS  Allergies  Allergen Reactions  . Lisinopril     Cough   . Crestor [Rosuvastatin]     Leg pain mainly in knees   . Pravastatin     Rxed by [facility] 2009; "myositis" Ok to take per pt's wife/Dr. Linna Darner  . Simvastatin     2007: nausea & vomiting ; [facility] ( he does not remember such)    Patient Measurements: Height: 5' 11"  (180.3 cm) Weight: 252 lb 6.8 oz (114.5 kg) IBW/kg (Calculated) : 75.3 Heparin Dosing Weight:   Vital Signs: Temp: 97.5 F (36.4 C) (05/26 0338) Temp src: Oral (05/26 0338) BP: 111/69 mmHg (05/26 0700) Pulse Rate: 48 (05/26 0700)  Labs:  Recent Labs  11/25/13 1825 11/26/13 0010 11/26/13 0355 11/26/13 0633  HGB 15.5  --   --  14.4  HCT 44.8  --   --  42.4  PLT 152  --   --  155  LABPROT  --   --   --  13.0  INR  --   --   --  1.00  HEPARINUNFRC  --   --   --  0.25*  CREATININE 1.00  --   --   --   TROPONINI 0.74* <0.30 <0.30  --     Estimated Creatinine Clearance: 89.7 ml/min (by C-G formula based on Cr of 1).   Medications:  Heparin at 1350/hr   Assessment: Heparin level 0.25 with no noted bleeding.  Goal of Therapy:  Heparin level 0.3-0.7 units/ml Monitor platelets by anticoagulation protocol: Yes   Plan:  Increase to 1500 units/hr and recheck HL in 6 hours  Curlene Dolphin 11/26/2013,7:14 AM

## 2013-11-26 NOTE — Interval H&P Note (Signed)
History and Physical Interval Note:  11/26/2013 7:46 AM  Jeff Weaver  has presented today for surgery, with the diagnosis of NSTEMI.  The various methods of treatment have been discussed with the patient and family. After consideration of risks, benefits and other options for treatment, the patient has consented to  Procedure(s): LEFT HEART CATHETERIZATION WITH CORONARY ANGIOGRAM (N/A) as a surgical intervention .    The patient's history has been reviewed, patient examined, no change in status, stable for surgery.  I have reviewed the patient's chart and labs.  Questions were answered to the patient's satisfaction.    H/o Cerebral hemorrhage - with aneurysm clipping.    Jeff Weaver  Cath Lab Visit (complete for each Cath Lab visit)  Clinical Evaluation Leading to the Procedure:   ACS: yes  Non-ACS:    Anginal Classification: CCS IV  Anti-ischemic medical therapy: Maximal Therapy (2 or more classes of medications)  Non-Invasive Test Results: No non-invasive testing performed  Prior CABG: No previous CABG

## 2013-11-26 NOTE — Progress Notes (Signed)
Clarified plan with Dr. Ellyn Hack. Pt is not a discharge today. Since he was adm with NSTEMI and had cath, observe overnight, check ESR and CRP for ? pericarditis, and possible DC in AM. [name] PA-C

## 2013-11-26 NOTE — Progress Notes (Signed)
TR BAND REMOVAL  LOCATION:    right radial  DEFLATED PER PROTOCOL:    yes  TIME BAND OFF / DRESSING APPLIED:    1130   SITE UPON ARRIVAL:    Level 0  SITE AFTER BAND REMOVAL:    Level 0  REVERSE ALLEN'S TEST:     positive  CIRCULATION SENSATION AND MOVEMENT:    Within Normal Limits   yes  COMMENTS:   Gauze dressing reassessed at 1200 with no change noted, remaining assessment without change, see doc flowsheets.

## 2013-11-26 NOTE — Progress Notes (Signed)
The patient received 1538m of keppra this morning.  After discussing with neurology we will still give 5041mtonight.  BrTarri FullerASt. Luke'[facility]

## 2013-11-26 NOTE — CV Procedure (Signed)
CARDIAC CATHETERIZATION REPORT  NAME:  Zakk Borgen   MRN: [id number] DOB:  20-Jul-1945   ADMIT DATE: 11/25/2013 Procedure Date: 11/26/2013  INTERVENTIONAL CARDIOLOGIST: [name], M.D., [place] PRIMARY CARE PROVIDER: Unice Cobble, MD PRIMARY CARDIOLOGIST: Glori Bickers, MD  PATIENT:  Dencil Cayson is a 68 y.o. male with obesity borderline hyperlipidemia and prediabetes with a history of cerebral aneurysm status post clipping at [facility] in August 2013. This is being followed by seizures and short-term memory loss. He walks up to 3 miles a day without difficulty. However on May 25 he presented to the [facility] with progressive chest discomfort and diaphoresis walking to a store. He was found to have mildly elevated troponin of 0.7. Based on his cardiac risk factors and positive for levels is referred for invasive evaluation cardiac catheterization.  PRE-OPERATIVE DIAGNOSIS:    NSTEMI  PROCEDURES PERFORMED:    LEFT HEART CATHETERIZATION WITH CORONARY ANGIOGRAPHY  PROCEDURE:Consent:  Risks of procedure as well as the alternatives and risks of each were explained to the (patient/caregiver).  Consent for procedure obtained. Consent for signed by MD and patient with RN witness -- placed on chart.   PROCEDURE: The patient was brought to the 2nd [facility] in the fasting state and prepped and draped in the usual sterile fashion for Right groin or radial access. A modified Allen's test with plethysmography was performed, revealing excellent Ulnar artery collateral flow.  Sterile technique was used including antiseptics, cap, gloves, gown, hand hygiene, mask and sheet.  Skin prep: Chlorhexidine.  Time Out: Verified patient identification, verified procedure, site/side was marked, verified correct patient position, special equipment/implants available, medications/allergies/relevent history reviewed, required  imaging and test results available.  Performed  Access: RIGHT RADIAL Artery; 6 Fr Sheath -- Seldinger technique (Angiocath Micropuncture Kit)  IA Radial Cocktail - 10 mL, IV Heparin 5000 Units Diagnostic Left Heart Catheterization with Coronary Angiography:  5 Fr TIG 4.0 & Angled Pigtail catheters advanced and exchanged over Long-Exchange Safety J-wire into the ascending aorta and used for selective coronary artery engagement.  Left & Right Coronary Artery Angiography: TIG 4.0  LV Hemodynamics (LV Gram): Angled Pigtail  TR Band:  0850 Hours, 16 mL air  Hemodynamics:  Central Aortic / Mean Pressures: 150/86/115 mmHg;   Left Ventricular Pressures / EDP: 151/17/28 mmHg;   Left Ventriculography:  EF: ~70 %  Wall Motion: hyperdynamic  Coronary Anatomy: Right Dominant  Left Main: Large caliber vessel that trifurcates into the LAD, Circumflex & Ramus Intermedius; Angiographically normal. LAD: Large caliber vessel that gives off a mid vessel diagonal branch perfusing the anterolateral wall. Tortuous as it reaches down around the apex perfusing the distal inferoapex.  D1: Smaller moderate caliber vessel, tortuous but angiographically normal.  Left Circumflex: Moderate caliber vessel which really courses in the AV groove and gives off a small posterior lateral branch. Mild diffuse luminal irregularities.   Ramus intermedius: Moderate caliber vessel that bifurcates into branch is very proximally. One courses as a high lateral OM1 and the other is a diagonal or ramus standard distribution.   The diagonal/ramus branch is the larger the 2 vessels and is tortuous. It reaches down almost to the apex.   The OM 1 branch is also moderate caliber which gives rise to proximal vessel and then tapers and a smaller OM 1 distally. Diffuse mild luminal irregularities noted.    RCA: Large-caliber, Butch Penny vessel that bifurcates distally into the right  posterior descending artery (RPDA) and the Right  Posterior AV Groove Branch (RPAV).  One major RV marginal branch from the mid vessel and a smaller almost tandem PDA noted. Angiographically normal RCA system.   RPDA: Large-caliber tortuous vessel which resolved vessel with the apex. Angiographically normal   RPL Sysytem:The RPAV begins as a large caliber vessel that bifurcates into 2 moderate caliber posterolateral branches. The more proximal branches the most significant one with the second posterolateral being somewhat smaller and giving off the AV nodal artery   After reviewing the initial angiography, noculprit lesion was identified.  Preparation were made to proceed with PCI on this lesion.  MEDICATIONS:  Anesthesia:  Local Lidocaine 2 ml, 100 mcg Sub Q NTG  Sedation:  1 mg IV Versed, 25 mcg IV fentanyl ; 25 mg Benadryl  Omnipaque Contrast: 110 ml  Anticoagulation:  IV Heparin 5000 Units Radial Cocktail: 5 mg Verapamil, 400 mcg NTG, 2 ml 2% Lidocaine in 10 ml NS  PATIENT DISPOSITION:    The patient was transferred to the PACU holding area in a hemodynamicaly stable, chest pain free condition.  The patient tolerated the procedure well, and there were no complications.  EBL:   < 10 ml  The patient was stable before, during, and after the procedure.  POST-OPERATIVE DIAGNOSIS:    Angiographically normal coronary arteries. Mild mid LAD myocardial bridging  Moderate to severely elevated LVEDP - with systemic Hypertension  Preserved LVEF  PLAN OF CARE:  Return to Nursing unit for standard Radial Cath care  Can d/c IV Heparin.  Follow Echo to look for possible signs of pericarditis.   [name], M.D., M.S. [facility] [address] [place], [place]  [address]  [phone]  11/26/2013 8:47 AM

## 2013-11-27 DIAGNOSIS — I440 Atrioventricular block, first degree: Secondary | ICD-10-CM | POA: Diagnosis not present

## 2013-11-27 DIAGNOSIS — R1013 Epigastric pain: Secondary | ICD-10-CM | POA: Diagnosis not present

## 2013-11-27 DIAGNOSIS — Q245 Malformation of coronary vessels: Secondary | ICD-10-CM | POA: Diagnosis not present

## 2013-11-27 DIAGNOSIS — I671 Cerebral aneurysm, nonruptured: Secondary | ICD-10-CM | POA: Diagnosis not present

## 2013-11-27 DIAGNOSIS — R079 Chest pain, unspecified: Secondary | ICD-10-CM

## 2013-11-27 DIAGNOSIS — K5010 Crohn's disease of large intestine without complications: Secondary | ICD-10-CM | POA: Diagnosis not present

## 2013-11-27 DIAGNOSIS — I441 Atrioventricular block, second degree: Secondary | ICD-10-CM | POA: Diagnosis not present

## 2013-11-27 DIAGNOSIS — I214 Non-ST elevation (NSTEMI) myocardial infarction: Secondary | ICD-10-CM | POA: Diagnosis not present

## 2013-11-27 DIAGNOSIS — I10 Essential (primary) hypertension: Secondary | ICD-10-CM

## 2013-11-27 DIAGNOSIS — R778 Other specified abnormalities of plasma proteins: Secondary | ICD-10-CM

## 2013-11-27 DIAGNOSIS — G4733 Obstructive sleep apnea (adult) (pediatric): Secondary | ICD-10-CM

## 2013-11-27 DIAGNOSIS — R7989 Other specified abnormal findings of blood chemistry: Secondary | ICD-10-CM

## 2013-11-27 LAB — BASIC METABOLIC PANEL
BUN: 13 mg/dL (ref 6–23)
CO2: 26 mEq/L (ref 19–32)
Calcium: 9.4 mg/dL (ref 8.4–10.5)
Chloride: 103 mEq/L (ref 96–112)
Creatinine, Ser: 0.91 mg/dL (ref 0.50–1.35)
GFR calc Af Amer: >90 mL/min (ref >90)
GFR calc non Af Amer: 84 mL/min — ABNORMAL LOW (ref >90)
Glucose, Bld: 117 mg/dL — ABNORMAL HIGH (ref 70–99)
Potassium: 4.4 mEq/L (ref 3.7–5.3)
Sodium: 141 mEq/L (ref 137–147)

## 2013-11-27 LAB — CBC
HCT: 45.4 % (ref 39.0–52.0)
Hemoglobin: 15.5 g/dL (ref 13.0–17.0)
MCH: 31.8 pg (ref 26.0–34.0)
MCHC: 34.1 g/dL (ref 30.0–36.0)
MCV: 93 fL (ref 78.0–100.0)
Platelets: 163 10*3/uL (ref 150–400)
RBC: 4.88 MIL/uL (ref 4.22–5.81)
RDW: 13.4 % (ref 11.5–15.5)
WBC: 9.3 10*3/uL (ref 4.0–10.5)

## 2013-11-27 LAB — C-REACTIVE PROTEIN: CRP: <0.5 mg/dL — ABNORMAL LOW (ref <0.60)

## 2013-11-27 MED ORDER — NITROGLYCERIN 0.4 MG SL SUBL: 0.4000 mg | SUBLINGUAL_TABLET | SUBLINGUAL | Status: DC | PRN

## 2013-11-27 NOTE — Progress Notes (Signed)
Patient first degree  AV HB then later had intermittent 2nd degree type 1&2 you may see CV strips in chart to review. EKG done but did not show. Patient asymptomatic and stable BP 110/54 notified Dr. Colon Flattery and I will continue to monitor.

## 2013-11-27 NOTE — Discharge Instructions (Signed)

## 2013-11-27 NOTE — Progress Notes (Signed)
We have no explanation for the chest pain. He denies dyspnea and haqs no complaints this AM. Labs are unremarkable. He is ready for discharge and can followup with his PCP.

## 2013-11-27 NOTE — Progress Notes (Signed)
Notified Dr. Tamala Julian of patients arrhythmias during the night, 2nd degree block while asleep. Reviewed monitor strips with Dr. Tamala Julian and notified that patient has sleep apnea and slept with oxygen but no CPAP last night. Arrythmia 2nd degree type I (Wenkebach) with ventricular escape beat, no 2nd degree type II noted in monitor strips.

## 2013-11-27 NOTE — Discharge Summary (Signed)
The patient underwent coronary angiography because of an elevated troponin. No significant coronary obstruction was found. He did have evidence of diastolic dysfunction. His blood pressure remained under good control. There was nocturnal episodes of Mobitz 1 second-degree heart block. Echocardiogram demonstrated normal LV function without evidence of pericardial effusion. Overall it is felt that the patient's chest discomfort is unlikely to be related to his heart. The discomfort is continuous and more likely to be musculoskeletal or neurogenic. No further cardiac evaluation was felt necessary and the patient and deemed eligible for discharge. The discharge summary and plans are accurate. The cath site at the time of discharge was unremarkable.

## 2013-11-27 NOTE — Discharge Summary (Signed)
Discharge Summary   Patient ID: Jeff Weaver MRN: [id number], DOB/AGE: [birth year] 68 y.o. Admit date: 11/25/2013 D/C date:     11/27/2013  Primary Cardiologist: Dr. Ellyn Hack   Principal Problem:   Elevated troponin Active Problems:   Other and unspecified hyperlipidemia   HYPERTENSION, ESSENTIAL NOS   First degree atrioventricular block   OSA (obstructive sleep apnea)   Cerebral aneurysm   Seizure disorder   Pre-diabetes   Non-ST elevation myocardial infarction (NSTEMI), initial care episode   Discharge Diagnosis: Chest pain and elevated troponin with unknown etiology s/p LHC with patent coronaries and no pericarditis on ECHO  HPI: Jeff Weaver is a 68 y.o. male with a history of obesity, HTN, HLD, prediabetes, cerebral aneurysm s/p clipping at [facility] in 8/13 c/b short-term memory loss, cerebral hemorrhage and seizure disorder s/p VP shunt who was transferred from [place] to [facility] on 11/25/13 for chest pain and elevated troponin (0.74).   On the day of admission he reported walking through a store and experiencing progressive chest pain associated with daiphoresis. He denied SOB or N/V. He drove to [place] where he received ASA and the pain resolved. He also admitted to intermittent, severe smaller episodes of chest pain over the past few months but these always resolved quickly. Additionally, he had had a normal stress test within last year, but never a cardiac cath.  Hospital Course Elevated troponin- He underwent LHC on 11/26/13 which revealed angiographically normal coronary arteries. Mild mid LAD myocardial bridging, moderate to severely elevated LVEDP - with systemic hypertension and preserved LVEF.  -- IV Heparin discontinued yesterday  -- A 2D Echo was ordered to look for possible signs of pericarditis. ECHO yesterday revealed EF 60-65%, mild LVH, no RWMAs. Mild AR, mod RV dilation, mild RA dilation. PA pressure 36 mmHG. Additionally an ESR and SED rate returned normal. --  Continue ASA, BB, statin   RLE swelling- this is chronic per patient. No pain, Doppler yesterday with no evidence of DVT.   HLD- lipid panel on this admission with TG 203, HDL 36. Otherwise normal. ontinue statin. C  Pre-diabetes- HgA1c 6.4. He should follow with primary care about lifestyle modifications and pre-diabetes management   HTN- Well controlled on losartan and HCTZ.   Seizures- on keppra. Quiescent on this admission.   The patient has had an uncomplicated hospital course and is recovering well. The radial catheter site is stable. He has been seen by Dr. Tamala Julian today and deemed ready for discharge home. All follow-up appointments have been scheduled. Discharge medications include are listed below.   Discharge Vitals: Blood pressure 126/69, pulse 57, temperature 98.6 F (37 C), temperature source Oral, resp. rate 20, height 5' 11" (1.803 m), weight 253 lb 8.5 oz (115 kg), SpO2 94.00%.  Labs: Lab Results  Component Value Date   WBC 9.3 11/27/2013   HGB 15.5 11/27/2013   HCT 45.4 11/27/2013   MCV 93.0 11/27/2013   PLT 163 11/27/2013     Recent Labs Lab 11/27/13 0716  NA 141  K 4.4  CL 103  CO2 26  BUN 13  CREATININE 0.91  CALCIUM 9.4  GLUCOSE 117*    Recent Labs  11/25/13 1825 11/26/13 0010 11/26/13 0355 11/26/13 1145  TROPONINI 0.74* <0.30 <0.30 <0.30   Lab Results  Component Value Date   CHOL 144 11/26/2013   HDL 36* 11/26/2013   LDLCALC 67 11/26/2013   TRIG 203* 11/26/2013   Lab Results  Component Value Date  DDIMER 0.33 11/25/2013    Diagnostic Studies/Procedures   Dg Chest 2 View  11/25/2013   CLINICAL DATA:  68 year old male with chest pain  EXAM: CHEST  2 VIEW  COMPARISON:  08/03/2016 and prior chest radiographs  FINDINGS: The cardiomediastinal silhouette is unremarkable.  Mild left basilar scarring again noted.  Catheter overlying the left chest is unchanged.  There is no evidence of focal airspace disease, pulmonary edema, suspicious pulmonary  nodule/mass, pleural effusion, or pneumothorax. No acute bony abnormalities are identified.  IMPRESSION: No evidence of acute cardiopulmonary disease  US Venous Img Lower Unilateral Right  11/25/2013   CLINICAL DATA:  68 year old male with right lower extremity pain and swelling.  EXAM: RIGHT LOWER EXTREMITY VENOUS DOPPLER ULTRASOUND  TECHNIQUE: Gray-scale sonography with graded compression, as well as color Doppler and duplex ultrasound were performed to evaluate the lower extremity deep venous systems from the level of the common femoral vein and including the common femoral, femoral, profunda femoral, popliteal and calf veins including the posterior tibial, peroneal and gastrocnemius veins when visible. The superficial great saphenous vein was also interrogated. Spectral Doppler was utilized to evaluate flow at rest and with distal augmentation maneuvers in the common femoral, femoral and popliteal veins.  COMPARISON:  None.  FINDINGS: Common Femoral Vein: No evidence of thrombus. Normal compressibility, respiratory phasicity and response to augmentation.  Saphenofemoral Junction: No evidence of thrombus. Normal compressibility and flow on color Doppler imaging.  Profunda Femoral Vein: No evidence of thrombus. Normal compressibility and flow on color Doppler imaging.  Femoral Vein: No evidence of thrombus. Normal compressibility, respiratory phasicity and response to augmentation.  Popliteal Vein: No evidence of thrombus. Normal compressibility, respiratory phasicity and response to augmentation.  Calf Veins: There is no evidence thrombus within the posterior tibial vein. The peroneal vein is not visualized.  Superficial Great Saphenous Vein: No evidence of thrombus. Normal compressibility and flow on color Doppler imaging.  Venous Reflux:  None.  Other Findings:  None.  IMPRESSION: No evidence of deep venous thrombosis.  Peroneal vein not well visualized.   CARDIAC CATH 11/26/13:  POST-OPERATIVE DIAGNOSIS:   Angiographically normal coronary arteries. Mild mid LAD myocardial bridging  Moderate to severely elevated LVEDP - with systemic Hypertension  Preserved LVEF PLAN OF CARE:  Return to Nursing unit for standard Radial Cath care  Can d/c IV Heparin.  Follow Echo to look for possible signs of pericarditis.   2D ECHO: 11/26/2013 LV EF: 60% - 65% Study Conclusions - Left ventricle: The cavity size was normal. Wall thickness was increased in a pattern of mild LVH. Systolic function was normal. The estimated ejection fraction was in the range of 60% to 65%. Wall motion was normal; there were no regional wall motion abnormalities. - Aortic valve: There was mild regurgitation. - Right ventricle: The cavity size was moderately dilated. - Right atrium: The atrium was mildly dilated. - Pulmonary arteries: Systolic pressure was mildly increased. PA peak pressure: 36 mm Hg (S).    Discharge Medications     Medication List         acetaminophen 325 MG tablet  Commonly known as:  TYLENOL  Take 650 mg by mouth every 6 (six) hours as needed for mild pain or moderate pain.     amLODipine 2.5 MG tablet  Commonly known as:  NORVASC  Take 2.5 mg by mouth daily.     fluticasone 50 MCG/ACT nasal spray  Commonly known as:  FLONASE  Place 1 spray into both nostrils 2 (  two) times daily.     hydrochlorothiazide 12.5 MG capsule  Commonly known as:  MICROZIDE  Take 12.5 mg by mouth daily.     IRON (FERROUS GLUCONATE) PO  Take 65 mg by mouth.     levETIRAcetam 500 MG tablet  Commonly known as:  KEPPRA  Take 500 mg by mouth 2 (two) times daily.     losartan 100 MG tablet  Commonly known as:  COZAAR  Take 50 mg by mouth daily.     Melatonin 3 MG Tabs  Take 1 tablet by mouth at bedtime as needed.     NON FORMULARY  CPAP Machine: authorized by [place] in [place]     omeprazole 20 MG capsule  Commonly known as:  PRILOSEC  TAKE ONE CAPSULE BY MOUTH ONE TIME DAILY     pravastatin 40 MG  tablet  Commonly known as:  PRAVACHOL  Take 40 mg by mouth daily.     pyridoxine 100 MG tablet  Commonly known as:  B-6  Take 100 mg by mouth daily.     tamsulosin 0.4 MG Caps capsule  Commonly known as:  FLOMAX  Take 0.8 mg by mouth at bedtime.     thiamine 100 MG tablet  Take 100 mg by mouth daily.     VIMPAT 100 MG Tabs  Generic drug:  Lacosamide  Take 200-300 mg by mouth 2 (two) times daily. 2 tablets in the am 3 in the PM        Disposition   The patient will be discharged in stable condition to home.  Follow-up Information   Follow up with HARDING,DAVID W, MD. (Please call to make an appointment with Dr. Ellyn Hack or a NP or PA if you have any problems)    Specialty:  Cardiology   Contact information:   [address] [place] [address] [phone]       Follow up with Unice Cobble, MD. (Please follow up with your PCP)    Specialty:  Internal Medicine   Contact information:   [address] [place] [address] [phone]         Duration of Discharge Encounter: Greater than 30 minutes including physician and PA time.  Signed, Perry Mount PA-C 11/27/2013, 10:28 AM

## 2013-11-27 NOTE — Progress Notes (Signed)
Patient Name: Jeff Weaver Date of Encounter: 11/27/2013     Principal Problem:   NSTEMI (non-ST elevated myocardial infarction) Active Problems:   HYPERTENSION, ESSENTIAL NOS   Non-ST elevation myocardial infarction (NSTEMI), initial care episode    SUBJECTIVE  Has had some intermittent dull aching chest pain since cath. It occurs both during rest and exertion. Tolerable pain that he has had in the past, not like the severe pain that brought him to the ED. Otherwise feeling good and ready to go home. Radial site stable.   CURRENT MEDS . amLODipine  2.5 mg Oral Daily  . aspirin EC  81 mg Oral Daily  . fluticasone  2 spray Each Nare Daily  . hydrochlorothiazide  12.5 mg Oral Daily  . lacosamide  200 mg Oral Daily  . lacosamide  300 mg Oral QHS  . levETIRAcetam  500 mg Oral BID  . losartan  50 mg Oral Daily  . pantoprazole  40 mg Oral Daily  . pravastatin  40 mg Oral Daily  . pyridoxine  100 mg Oral Daily  . sodium chloride  3 mL Intravenous Q12H  . tamsulosin  0.8 mg Oral QHS  . thiamine  100 mg Oral Daily    OBJECTIVE  Filed Vitals:   11/26/13 1538 11/26/13 2108 11/26/13 2352 11/27/13 0427  BP: 150/80 135/75 132/73 110/54  Pulse: 49 52 53 56  Temp: 98.5 F (36.9 C) 97.6 F (36.4 C) 97.4 F (36.3 C) 98.7 F (37.1 C)  TempSrc: Oral Oral Oral Oral  Resp: 20 16 18 20   Height:      Weight:    253 lb 8.5 oz (115 kg)  SpO2:  99% 99% 94%    Intake/Output Summary (Last 24 hours) at 11/27/13 0624 Last data filed at 11/27/13 0112  Gross per 24 hour  Intake   1381 ml  Output   2775 ml  Net  -1394 ml   Filed Weights   11/25/13 2100 11/26/13 0338 11/27/13 0427  Weight: 252 lb 6.8 oz (114.5 kg) 252 lb 6.8 oz (114.5 kg) 253 lb 8.5 oz (115 kg)    PHYSICAL EXAM  General: Pleasant, NAD. obese Neuro: Alert and oriented X 3. Moves all extremities spontaneously. Psych: Normal affect. HEENT:  Normal  Neck: Supple without bruits or JVD. Lungs:  Resp regular and  unlabored, CTA. Heart: RRR no s3, s4, or murmurs. bradycardia Abdomen: Soft, non-tender, non-distended, BS + x 4. obese Extremities: No clubbing, cyanosis or edema. DP/PT/Radials 2+ and equal bilaterally. Trace edema R>L  Accessory Clinical Findings  CBC  Recent Labs  11/25/13 1825 11/26/13 0633  WBC 7.9 7.7  NEUTROABS 5.5  --   HGB 15.5 14.4  HCT 44.8 42.4  MCV 92.6 92.0  PLT 152 638   Basic Metabolic Panel  Recent Labs  11/25/13 1825 11/26/13 0633  NA 142 138  K 3.8 3.7  CL 99 101  CO2 29 27  GLUCOSE 94 117*  BUN 15 14  CREATININE 1.00 0.83  CALCIUM 9.7 8.8    Cardiac Enzymes  Recent Labs  11/26/13 0010 11/26/13 0355 11/26/13 1145  TROPONINI <0.30 <0.30 <0.30     D-Dimer  Recent Labs  11/25/13 1825  DDIMER 0.33   Hemoglobin A1C  Recent Labs  11/26/13 0633  HGBA1C 6.4*   Fasting Lipid Panel  Recent Labs  11/26/13 0633  CHOL 144  HDL 36*  LDLCALC 67  TRIG 203*  CHOLHDL 4.0    TELE  Sinus brady  with intermittent second degree AV block type I and II   Radiology/Studies  Dg Chest 2 View  11/25/2013   CLINICAL DATA:  68 year old male with chest pain  EXAM: CHEST  2 VIEW  COMPARISON:  08/03/2016 and prior chest radiographs  FINDINGS: The cardiomediastinal silhouette is unremarkable.  Mild left basilar scarring again noted.  Catheter overlying the left chest is unchanged.  There is no evidence of focal airspace disease, pulmonary edema, suspicious pulmonary nodule/mass, pleural effusion, or pneumothorax. No acute bony abnormalities are identified.  IMPRESSION: No evidence of acute cardiopulmonary disease.   Electronically Signed   By: Hassan Rowan M.D.   On: 11/25/2013 19:12   US Venous Img Lower Unilateral Right  11/25/2013   CLINICAL DATA:  68 year old male with right lower extremity pain and swelling.  EXAM: RIGHT LOWER EXTREMITY VENOUS DOPPLER ULTRASOUND  TECHNIQUE: Gray-scale sonography with graded compression, as well as color Doppler and  duplex ultrasound were performed to evaluate the lower extremity deep venous systems from the level of the common femoral vein and including the common femoral, femoral, profunda femoral, popliteal and calf veins including the posterior tibial, peroneal and gastrocnemius veins when visible. The superficial great saphenous vein was also interrogated. Spectral Doppler was utilized to evaluate flow at rest and with distal augmentation maneuvers in the common femoral, femoral and popliteal veins.  COMPARISON:  None.  FINDINGS: Common Femoral Vein: No evidence of thrombus. Normal compressibility, respiratory phasicity and response to augmentation.  Saphenofemoral Junction: No evidence of thrombus. Normal compressibility and flow on color Doppler imaging.  Profunda Femoral Vein: No evidence of thrombus. Normal compressibility and flow on color Doppler imaging.  Femoral Vein: No evidence of thrombus. Normal compressibility, respiratory phasicity and response to augmentation.  Popliteal Vein: No evidence of thrombus. Normal compressibility, respiratory phasicity and response to augmentation.  Calf Veins: There is no evidence thrombus within the posterior tibial vein. The peroneal vein is not visualized.  Superficial Great Saphenous Vein: No evidence of thrombus. Normal compressibility and flow on color Doppler imaging.  Venous Reflux:  None.  Other Findings:  None.  IMPRESSION: No evidence of deep venous thrombosis.  Peroneal vein not well visualized.   Electronically Signed   By: Hassan Rowan M.D.   On: 11/25/2013 19:42   CARDIAC CATH 11/26/13: POST-OPERATIVE DIAGNOSIS:  Angiographically normal coronary arteries. Mild mid LAD myocardial bridging  Moderate to severely elevated LVEDP - with systemic Hypertension  Preserved LVEF PLAN OF CARE:  Return to Nursing unit for standard Radial Cath care  Can d/c IV Heparin.  Follow Echo to look for possible signs of pericarditis.   2D ECHO: 11/26/2013 LV EF: 60% - 65% Study  Conclusions - Left ventricle: The cavity size was normal. Wall thickness was increased in a pattern of mild LVH. Systolic function was normal. The estimated ejection fraction was in the range of 60% to 65%. Wall motion was normal; there were no regional wall motion abnormalities. - Aortic valve: There was mild regurgitation. - Right ventricle: The cavity size was moderately dilated. - Right atrium: The atrium was mildly dilated. - Pulmonary arteries: Systolic pressure was mildly increased. PA peak pressure: 36 mm Hg (S).    ASSESSMENT AND PLAN  68 y/o male with obesity, HTN, borderline HL, prediabetic and cerebral aneurysm s/p clipping at [facility] in 8/13 c/b short-term memory loss, CVA, cerebral hemorrhage and seizure d/o s/p VP shunt. Transferred from [place] to [facility] on 11/25/13 for NSTEMI (trop 0.7)  NSTEMI- LHC yesterday  with angiographically normal coronary arteries. Mild mid LAD myocardial bridging, moderate to severely elevated LVEDP - with systemic hypertension and preserved LVEF -- IV Heparin discontinued -- A 2D Echo was ordered to look for possible signs of pericarditis. ECHO yesterday revealed EF 60-65%, mild LVH, no RWMAs. Mild AR, mod RV dilation, mild RA dilation. PA pressure 36 mmHG.  -- ESR and SED rate normal -- Continue ASA, BB, statin  RLE swelling- this is chronic per patient. No pain, Doppler yesterday with no evidence of DVT.  HLD- lipid panel on this admission with TG 203, HDL 36. Otherwise normal.  Continue statin.   Pre-diabetes- HgA1c 6.4. He should follow with primary care about lifestyle modifications and pre-diabetes management   HTN- continue losartan, HCTZ. Well controlled.   Seizures- on keppra  Dispo- from [facility] will follow up with Dr. Ellyn Hack in [place]. Can likely go home today. MD to see.    Signed, Perry Mount PA-C  Pager [phone]

## 2013-12-19 ENCOUNTER — Other Ambulatory Visit: Payer: Self-pay | Admitting: Internal Medicine

## 2014-01-06 ENCOUNTER — Encounter: Payer: Self-pay | Admitting: *Deleted

## 2014-01-06 ENCOUNTER — Encounter: Payer: Medicare Other | Attending: Internal Medicine | Admitting: *Deleted

## 2014-01-06 VITALS — Ht 70.0 in | Wt 258.4 lb

## 2014-01-06 DIAGNOSIS — E669 Obesity, unspecified: Secondary | ICD-10-CM | POA: Diagnosis not present

## 2014-01-06 DIAGNOSIS — Z713 Dietary counseling and surveillance: Secondary | ICD-10-CM | POA: Insufficient documentation

## 2014-01-06 DIAGNOSIS — R7303 Prediabetes: Secondary | ICD-10-CM

## 2014-01-06 DIAGNOSIS — Z833 Family history of diabetes mellitus: Secondary | ICD-10-CM | POA: Diagnosis not present

## 2014-01-06 NOTE — Patient Instructions (Signed)
Plan:  Aim for 3 Carb Choices per meal (45 grams) +/- 1 either way  Aim for 0-1 Carbs per snack if hungry  Include protein in moderation with your meals and snacks Consider reading food labels for Total Carbohydrate of foods Continue with your activity level and increase as tolerated

## 2014-01-06 NOTE — Progress Notes (Signed)
  Medical Nutrition Therapy:  Appt start time: 1115 end time:  1200.  Assessment:  Primary concerns today: Jeff Weaver is here for a follow up for weight loss. His usual RD Provider is not available today, so I am seeing him instead. His wife [name] is here with him and appears supportive. He reviewed his history of retiring from the [employer] where his typical weight was about 185 pounds for 25 years.  He had an aneurysm on February 02, 2012 at which time he reports weighing about 216 pounds. He has gained about 50 pounds since then through rehab and recovery. He continues to walk with his neighbor for about 3 miles every evening. Still having short term memory issues and some frustration with not being able to remember things. He took notes throughout our visit. His wife asks about joining a supportive group like [employer] for ongoing accountability.  Wt Readings from Last 3 Encounters:  01/06/14 258 lb 6.4 oz (117.209 kg)  11/27/13 253 lb 8.5 oz (115 kg)  11/27/13 253 lb 8.5 oz (115 kg)   Ht Readings from Last 3 Encounters:  01/06/14 5' 10"  (1.778 m)  11/25/13 5' 11"  (1.803 m)  11/25/13 5' 11"  (1.803 m)   Body mass index is 37.08 kg/(m^2). @BMIFA @ Normalized weight-for-age data available only for age 24 to 22 years. Normalized stature-for-age data available only for age 24 to 73 years.   Preferred Learning Style:   No preference indicated   Learning Readiness:   Ready  MEDICATIONS: see list   DIETARY INTAKE:  24-hr recall:  B (8:30-10:30 AM): black coffee (not hungry) 1-2 packages carnation instant breakfast, and yogurt, OR oatmeal (flavored) 12-16 oz Orange juice OR Crystal Light lemonade,   Snk ( AM): none  L ( PM): skips lunch 2-3 x week, sandwich or leftovers, salad from fast food with water Snk ( PM): nabs sometimes D ( PM): beef stew OR pork chops with macaroni cheese (1/2 cup), green beans, tomatoes, shrimp, salad, or baked potato Snk ( PM): cheese crackers (may be large) or  nabs Beverages: most of the time water, diet tonic water, cola sometimes, black coffee in morning   Usual physical activity: walking 3 miles @ 5 x week with other gentlemen takes about 2 - 2.5 hours at least with "rests" and "visits")  Estimated energy needs: 1600 calories 180 g carbohydrates 120 g protein 44 g fat  Progress Towards Goal(s):  In progress.   Nutritional Diagnosis:  [place]-3.3 Overweight/obesity As related to history of frequent snacking and reduced physical acitivity .  As evidenced by BMI of 38.0 and Hgb A1c of 6.3%.    Intervention:  Nutrition counseling for weight loss education initiated. Discussed Carb Counting as method of portion control, reading food labels, and benefits of increased activity.  Plan:  Aim for 3 Carb Choices per meal (45 grams) +/- 1 either way  Aim for 0-1 Carbs per snack if hungry  Include protein in moderation with your meals and snacks Consider reading food labels for Total Carbohydrate of foods Continue with your activity level and increase as tolerated  Teaching Method Utilized: Visual Auditory Hands on  Barriers to learning/adherence to lifestyle change: short term memory issues  Demonstrated degree of understanding via:  Teach Back   Monitoring/Evaluation:  Dietary intake, exercise, mindful eating, and body weight PRN, patient to call for appointment as needed.

## 2014-01-08 DIAGNOSIS — H2589 Other age-related cataract: Secondary | ICD-10-CM | POA: Diagnosis not present

## 2014-01-08 DIAGNOSIS — H5200 Hypermetropia, unspecified eye: Secondary | ICD-10-CM | POA: Diagnosis not present

## 2014-01-15 ENCOUNTER — Other Ambulatory Visit: Payer: Self-pay | Admitting: Internal Medicine

## 2014-01-27 DIAGNOSIS — R3915 Urgency of urination: Secondary | ICD-10-CM | POA: Diagnosis not present

## 2014-01-27 DIAGNOSIS — N401 Enlarged prostate with lower urinary tract symptoms: Secondary | ICD-10-CM | POA: Diagnosis not present

## 2014-01-27 DIAGNOSIS — N138 Other obstructive and reflux uropathy: Secondary | ICD-10-CM | POA: Diagnosis not present

## 2014-02-09 ENCOUNTER — Other Ambulatory Visit: Payer: Self-pay | Admitting: Internal Medicine

## 2014-03-21 ENCOUNTER — Encounter: Payer: Self-pay | Admitting: Internal Medicine

## 2014-03-21 ENCOUNTER — Ambulatory Visit (INDEPENDENT_AMBULATORY_CARE_PROVIDER_SITE_OTHER): Payer: Medicare Other | Admitting: Internal Medicine

## 2014-03-21 VITALS — BP 132/62 | HR 64 | Ht 68.25 in | Wt 255.4 lb

## 2014-03-21 DIAGNOSIS — R198 Other specified symptoms and signs involving the digestive system and abdomen: Secondary | ICD-10-CM

## 2014-03-21 DIAGNOSIS — K5010 Crohn's disease of large intestine without complications: Secondary | ICD-10-CM | POA: Diagnosis not present

## 2014-03-21 DIAGNOSIS — K3189 Other diseases of stomach and duodenum: Secondary | ICD-10-CM

## 2014-03-21 DIAGNOSIS — R1013 Epigastric pain: Secondary | ICD-10-CM

## 2014-03-21 MED ORDER — PANTOPRAZOLE SODIUM 40 MG PO TBEC: DELAYED_RELEASE_TABLET | ORAL | Status: DC

## 2014-03-21 NOTE — Patient Instructions (Signed)
We have sent the following medications to your pharmacy for you to pick up at your convenience: Protonix 40 mg twice daily x 2 weeks, then decrease to once daily thereafter  Please purchase the following medications over the counter and take as directed: Milk of Magnesia 30 mg (2 tablespoons) twice weekly  You have been scheduled for an endoscopy. Please follow written instructions given to you at your visit today. If you use inhalers (even only as needed), please bring them with you on the day of your procedure. Your physician has requested that you go to www.startemmi.com and enter the access code given to you at your visit today. This web site gives a general overview about your procedure. However, you should still follow specific instructions given to you by our office regarding your preparation for the procedure.  You have been scheduled for an abdominal ultrasound at [facility] (1st floor of hospital) on _________ at ________. Please arrive 15 minutes prior to your appointment for registration. Make certain not to have anything to eat or drink 6 hours prior to your appointment. Should you need to reschedule your appointment, please contact radiology at [phone]. This test typically takes about 30 minutes to perform.  CC:[employer]

## 2014-03-21 NOTE — Progress Notes (Signed)
Jeff Weaver Oct 27, 1945 [id number]  Note: This dictation was prepared with Dragon digital system. Any transcriptional errors that result from this procedure are unintentional.   History of Present Illness: This is a 68 year old white male  complaining of bloating and abdominal distention as well as constant abdominal discomfort. He has been gaining weight. We have seen him in the past for Crohn's colitis which was initially followed at the [facility] until 2009. His last colonoscopy in October 2014 was normal with the exception of mild diverticulosis. Random biopsies of the right and  left colon were normal. He has been taking Pepcid complete  10 mg on when necessary basis for gastroesophageal reflux. He has occasional dysphagia to solids and liquids and choking spells. The food comes up into his chest. 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 He had a cranial peritoneal shunt placed which has been functioning    Past Medical History  Diagnosis Date  . Hyperlipidemia   . HTN (hypertension)   . Enteritis (regional)     Dr Olevia Perches  . Rheumatoid arthritis(714.0)     dxed in [employer] in 1980s  . Sleep apnea   . Cerebral aneurysm   . CVA (cerebral infarction)   . Seizures   . Diverticulosis   . GI bleed   . IBS (irritable bowel syndrome)   . Regional enteritis of large intestine since 1978    Past Surgical History  Procedure Laterality Date  . Nose surgery    . Colonoscopy w/ polypectomy  1978    negative 2009, Dr Olevia Perches. Due 2014  . Shoulder surgery  1997  . Hernia repair    . Tonsillectomy and adenoidectomy    . Craniotomy  02/02/12    Dr Harvel Ricks, [place] of aneurysm  . Cns shunt  02/23/12  . 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 Ventriculoperitoneal shunt  02-23-12    INSERTION OF RIGHT FRONTAL VENTRICULOPERITONEAL SHUNT  WITH A CODMAN HAKIM PROGRAMMABLE VALVE    Allergies  Allergen Reactions  . Lisinopril     Cough   . Crestor [Rosuvastatin]     Leg pain mainly in knees   . Pravastatin     Rxed by [facility] 2009; "myositis" Ok to take per pt's wife/Dr. Linna Darner  . Simvastatin     2007: nausea & vomiting ; [facility] ( he does not remember such)    Family history and social history have been reviewed.  Review of Systems: Intermittent dysphagia. Abdominal distention. Denies any lower GI symptoms  The remainder of the 10 point ROS is negative except as outlined in the H&P  Physical Exam: General Appearance Well developed, in no distress, obese with large protuberant abdomen Eyes  Non icteric  HEENT  Non traumatic, normocephalic  Mouth No lesion, tongue papillated, no cheilosis Neck Supple without adenopathy, thyroid not enlarged, no carotid bruits, no JVD Lungs Clear to auscultation bilaterally COR Normal S1, normal S2, regular rhythm, no murmur, quiet precordium Abdomen large obese protuberant appearance with normoactive bowel sounds. Mild tenderness in left lower quadrant. Liver edge at costal margin. No ascites. No ventral or umbilical hernia Rectal not done Extremities  No pedal edema Skin No lesions Neurological Alert and oriented x 3 Psychological Normal mood and affect  Assessment and Plan:   68 year old white male with abdominal distention partly due to obesity and loss of abdominal muscles.  Bloating and mild constipation may be contributing  factors. He has severe gastroesophageal reflux and needs stronger medications for acid suppression. We will proceed with upper abdominal ultrasound to rule out symptomatic gallbladder disease. We will also start Protonix 40 mg twice a day for 2 weeks then once a day. I will also schedule him for upper endoscopy to look for hiatal hernia stricture or gastritis. We have discussed weight loss which would the be accomplished only if he increases his exercise and  decrease his caloric intake Crohn's disease- not active, up to date on colonoscopy    [name] 03/21/2014

## 2014-03-26 ENCOUNTER — Ambulatory Visit (HOSPITAL_COMMUNITY)
Admission: RE | Admit: 2014-03-26 | Discharge: 2014-03-26 | Disposition: A | Discharge status: Home / Self Care | Payer: Medicare Other | Source: Ambulatory Visit | Attending: Internal Medicine | Admitting: Internal Medicine

## 2014-03-26 DIAGNOSIS — R141 Gas pain: Secondary | ICD-10-CM | POA: Insufficient documentation

## 2014-03-26 DIAGNOSIS — R142 Eructation: Secondary | ICD-10-CM

## 2014-03-26 DIAGNOSIS — R198 Other specified symptoms and signs involving the digestive system and abdomen: Secondary | ICD-10-CM

## 2014-03-26 DIAGNOSIS — R1013 Epigastric pain: Secondary | ICD-10-CM

## 2014-03-26 DIAGNOSIS — R143 Flatulence: Secondary | ICD-10-CM

## 2014-03-26 DIAGNOSIS — K3189 Other diseases of stomach and duodenum: Secondary | ICD-10-CM | POA: Diagnosis not present

## 2014-03-26 DIAGNOSIS — K7689 Other specified diseases of liver: Secondary | ICD-10-CM | POA: Diagnosis not present

## 2014-04-07 DIAGNOSIS — G40219 Localization-related (focal) (partial) symptomatic epilepsy and epileptic syndromes with complex partial seizures, intractable, without status epilepticus: Secondary | ICD-10-CM | POA: Diagnosis not present

## 2014-04-07 DIAGNOSIS — I671 Cerebral aneurysm, nonruptured: Secondary | ICD-10-CM | POA: Diagnosis not present

## 2014-04-07 DIAGNOSIS — Z8673 Personal history of transient ischemic attack (TIA), and cerebral infarction without residual deficits: Secondary | ICD-10-CM | POA: Diagnosis not present

## 2014-04-07 DIAGNOSIS — E785 Hyperlipidemia, unspecified: Secondary | ICD-10-CM | POA: Diagnosis not present

## 2014-04-07 DIAGNOSIS — Z982 Presence of cerebrospinal fluid drainage device: Secondary | ICD-10-CM | POA: Diagnosis not present

## 2014-04-07 DIAGNOSIS — R635 Abnormal weight gain: Secondary | ICD-10-CM | POA: Diagnosis not present

## 2014-04-07 DIAGNOSIS — H02002 Unspecified entropion of right lower eyelid: Secondary | ICD-10-CM | POA: Diagnosis not present

## 2014-04-07 DIAGNOSIS — G4733 Obstructive sleep apnea (adult) (pediatric): Secondary | ICD-10-CM | POA: Diagnosis not present

## 2014-04-07 DIAGNOSIS — R569 Unspecified convulsions: Secondary | ICD-10-CM | POA: Diagnosis not present

## 2014-04-07 DIAGNOSIS — I10 Essential (primary) hypertension: Secondary | ICD-10-CM | POA: Diagnosis not present

## 2014-04-16 ENCOUNTER — Encounter (HOSPITAL_BASED_OUTPATIENT_CLINIC_OR_DEPARTMENT_OTHER): Payer: Self-pay | Admitting: Emergency Medicine

## 2014-04-16 ENCOUNTER — Emergency Department (HOSPITAL_BASED_OUTPATIENT_CLINIC_OR_DEPARTMENT_OTHER)
Admission: EM | Admit: 2014-04-16 | Discharge: 2014-04-16 | Disposition: A | Discharge status: Home / Self Care | Payer: Medicare Other | Attending: Emergency Medicine | Admitting: Emergency Medicine

## 2014-04-16 DIAGNOSIS — Y9389 Activity, other specified: Secondary | ICD-10-CM | POA: Insufficient documentation

## 2014-04-16 DIAGNOSIS — Z79899 Other long term (current) drug therapy: Secondary | ICD-10-CM | POA: Diagnosis not present

## 2014-04-16 DIAGNOSIS — Z7952 Long term (current) use of systemic steroids: Secondary | ICD-10-CM | POA: Diagnosis not present

## 2014-04-16 DIAGNOSIS — Z043 Encounter for examination and observation following other accident: Secondary | ICD-10-CM | POA: Insufficient documentation

## 2014-04-16 DIAGNOSIS — G40909 Epilepsy, unspecified, not intractable, without status epilepticus: Secondary | ICD-10-CM | POA: Diagnosis not present

## 2014-04-16 DIAGNOSIS — W19XXXA Unspecified fall, initial encounter: Secondary | ICD-10-CM

## 2014-04-16 DIAGNOSIS — I10 Essential (primary) hypertension: Secondary | ICD-10-CM | POA: Diagnosis not present

## 2014-04-16 DIAGNOSIS — E785 Hyperlipidemia, unspecified: Secondary | ICD-10-CM | POA: Diagnosis not present

## 2014-04-16 DIAGNOSIS — Y92009 Unspecified place in unspecified non-institutional (private) residence as the place of occurrence of the external cause: Secondary | ICD-10-CM | POA: Insufficient documentation

## 2014-04-16 DIAGNOSIS — Z8719 Personal history of other diseases of the digestive system: Secondary | ICD-10-CM | POA: Diagnosis not present

## 2014-04-16 DIAGNOSIS — Z87891 Personal history of nicotine dependence: Secondary | ICD-10-CM | POA: Insufficient documentation

## 2014-04-16 DIAGNOSIS — W1839XA Other fall on same level, initial encounter: Secondary | ICD-10-CM | POA: Diagnosis not present

## 2014-04-16 NOTE — ED Notes (Signed)
Lost balance and fell backward onto lawn.   No obvious injury.  Wife concerned because pt has hx of brain aneurysm surgery, bleeds, and stroke.  Has shunt. Is not on any blood thinners.

## 2014-04-16 NOTE — Discharge Instructions (Signed)
Return for recheck with any worsening symptoms.

## 2014-04-19 NOTE — ED Provider Notes (Signed)
CSN: [id number]     Arrival date & time 04/16/14  1715 History   First MD Initiated Contact with Patient 04/16/14 1751     Chief Complaint  Patient presents with  . Fall      HPI  Pt presents after a fall at home. He lost his house and fell backward onto his butt and then onto his grass lawn. Did not really strike his head. No headache. Did not strike the left side of his head where his shunt is. History of subarachnoid hemorrhage secondary history no aneurysm and secondary hydrocephalus requiring ventriculoperitoneal shunt. No symptoms now. Headache. No vision changes. No nausea. No areas of pain or concern or injury.  Past Medical History  Diagnosis Date  . Hyperlipidemia   . HTN (hypertension)   . Enteritis (regional)     Dr Olevia Perches  . Rheumatoid arthritis(714.0)     dxed in [employer] in 1980s  . Sleep apnea   . Cerebral aneurysm   . CVA (cerebral infarction)   . Seizures   . Diverticulosis   . GI bleed   . IBS (irritable bowel syndrome)   . Regional enteritis of large intestine since 1978   Past Surgical History  Procedure Laterality Date  . Nose surgery    . Colonoscopy w/ polypectomy  1978    negative 2009, Dr Olevia Perches. Due 2014  . Shoulder surgery  1997  . Hernia repair    . Tonsillectomy and adenoidectomy    . Craniotomy  02/02/12    Dr Harvel Ricks, [place] of aneurysm  . Cns shunt  02/23/12  . 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 Ventriculoperitoneal shunt  02-23-12    INSERTION OF RIGHT FRONTAL VENTRICULOPERITONEAL SHUNT WITH A CODMAN HAKIM PROGRAMMABLE VALVE   Family History  Problem Relation Age of Onset  . COPD Father   . Coronary artery disease Father     MI in 45s  . Hepatitis Mother   . Diabetes Sister   . Dementia Sister 19  . Diabetes Brother   . Colon cancer Neg Hx    History  Substance Use Topics  . Smoking status: Former Smoker    Quit date: 07/04/2009  . Smokeless  tobacco: Never Used  . Alcohol Use: Yes     Comment: Very Infrequently     Review of Systems  Constitutional: Negative for fever, chills, diaphoresis, appetite change and fatigue.  HENT: Negative for mouth sores, sore throat and trouble swallowing.   Eyes: Negative for visual disturbance.  Respiratory: Negative for cough, chest tightness, shortness of breath and wheezing.   Cardiovascular: Negative for chest pain.  Gastrointestinal: Negative for nausea, vomiting, abdominal pain, diarrhea and abdominal distention.  Endocrine: Negative for polydipsia, polyphagia and polyuria.  Genitourinary: Negative for dysuria, frequency and hematuria.  Musculoskeletal: Negative for gait problem.  Skin: Negative for color change, pallor and rash.  Neurological: Negative for dizziness, syncope, light-headedness and headaches.  Hematological: Does not bruise/bleed easily.  Psychiatric/Behavioral: Negative for behavioral problems and confusion.      Allergies  Lisinopril; Crestor; Pravastatin; and Simvastatin  Home Medications   Prior to Admission medications   Medication Sig Start Date End Date Taking? Authorizing Provider  amLODipine (NORVASC) 2.5 MG tablet Take 2.5 mg by mouth daily.   Yes Historical Provider, MD  fluticasone (FLONASE) 50 MCG/ACT nasal spray Place 1 spray into both nostrils 2 (two) times daily.   Yes Historical Provider, MD  hydrochlorothiazide (MICROZIDE) 12.5 MG capsule Take 12.5 mg by mouth daily.   Yes Historical Provider, MD  IRON, FERROUS GLUCONATE, PO Take 65 mg by mouth.   Yes Historical Provider, MD  Lacosamide (VIMPAT) 100 MG TABS Take 200-300 mg by mouth 2 (two) times daily. 2 tablets in the am 3 in the PM   Yes Historical Provider, MD  levETIRAcetam (KEPPRA) 500 MG tablet Take 500 mg by mouth 2 (two) times daily.    Yes Historical Provider, MD  losartan (COZAAR) 100 MG tablet TAKE 1/2 TABLET BY MOUTH EVERY DAY    Yes Hendricks Limes, MD  Melatonin 3 MG TABS Take 1  tablet by mouth at bedtime as needed.   Yes Historical Provider, MD  NON FORMULARY CPAP Machine: authorized by [facility] in [facility]   Yes Historical Provider, MD  pantoprazole (PROTONIX) 40 MG tablet Take 1 tablet by mouth twice daily x 2 weeks, then decrease to once daily thereafter 03/21/14  Yes Lafayette Dragon, MD  pravastatin (PRAVACHOL) 40 MG tablet TAKE ONE TABLET BY MOUTH NIGHTLY AT BEDTIME  01/15/14  Yes Hendricks Limes, MD  pyridoxine (B-6) 100 MG tablet Take 100 mg by mouth daily.   Yes Historical Provider, MD  Tamsulosin HCl (FLOMAX) 0.4 MG CAPS Take 0.8 mg by mouth at bedtime.    Yes Historical Provider, MD  thiamine 100 MG tablet Take 100 mg by mouth daily.   Yes Historical Provider, MD  acetaminophen (TYLENOL) 325 MG tablet Take 650 mg by mouth every 6 (six) hours as needed for mild pain or moderate pain.    Historical Provider, MD   BP 148/79  Pulse 67  Temp(Src) 98 F (36.7 C) (Oral)  Resp 18  Ht 5' 10"  (1.778 m)  Wt 255 lb (115.667 kg)  BMI 36.59 kg/m2  SpO2 97% Physical Exam  Constitutional: He is oriented to person, place, and time. He appears well-developed and well-nourished. No distress.  HENT:  Head: Normocephalic.    Eyes: Conjunctivae are normal. Pupils are equal, round, and reactive to light. No scleral icterus.  Neck: Normal range of motion. Neck supple. No thyromegaly present.  Cardiovascular: Normal rate and regular rhythm.  Exam reveals no gallop and no friction rub.   No murmur heard. Pulmonary/Chest: Effort normal and breath sounds normal. No respiratory distress. He has no wheezes. He has no rales.  Abdominal: Soft. Bowel sounds are normal. He exhibits no distension. There is no tenderness. There is no rebound.  Musculoskeletal: Normal range of motion.  Neurological: He is alert and oriented to person, place, and time.  Skin: Skin is warm and dry. No rash noted.  Psychiatric: He has a normal mood and affect. His behavior is normal.    ED Course  Procedures  (including critical care time) Labs Review Labs Reviewed - No data to display  Imaging Review No results found.   EKG Interpretation None      MDM   Final diagnoses:  Fall, initial encounter    Normal exam. No concerning symptoms. He is appropriate for discharge without imaging.    Tanna Furry, MD 04/19/14 [phone]

## 2014-04-25 ENCOUNTER — Ambulatory Visit (AMBULATORY_SURGERY_CENTER): Payer: Medicare Other | Admitting: Internal Medicine

## 2014-04-25 ENCOUNTER — Telehealth: Payer: Self-pay | Admitting: *Deleted

## 2014-04-25 ENCOUNTER — Encounter: Payer: Self-pay | Admitting: Internal Medicine

## 2014-04-25 ENCOUNTER — Other Ambulatory Visit: Payer: Self-pay | Admitting: *Deleted

## 2014-04-25 VITALS — BP 63/31 | HR 54 | Temp 96.9°F | Resp 66 | Ht 68.0 in | Wt 255.0 lb

## 2014-04-25 DIAGNOSIS — I440 Atrioventricular block, first degree: Secondary | ICD-10-CM | POA: Diagnosis not present

## 2014-04-25 DIAGNOSIS — I10 Essential (primary) hypertension: Secondary | ICD-10-CM | POA: Diagnosis not present

## 2014-04-25 DIAGNOSIS — K317 Polyp of stomach and duodenum: Secondary | ICD-10-CM

## 2014-04-25 DIAGNOSIS — G4733 Obstructive sleep apnea (adult) (pediatric): Secondary | ICD-10-CM | POA: Diagnosis not present

## 2014-04-25 DIAGNOSIS — K30 Functional dyspepsia: Secondary | ICD-10-CM | POA: Diagnosis not present

## 2014-04-25 DIAGNOSIS — R1013 Epigastric pain: Secondary | ICD-10-CM | POA: Diagnosis not present

## 2014-04-25 DIAGNOSIS — E669 Obesity, unspecified: Secondary | ICD-10-CM | POA: Diagnosis not present

## 2014-04-25 DIAGNOSIS — K8020 Calculus of gallbladder without cholecystitis without obstruction: Secondary | ICD-10-CM

## 2014-04-25 MED ORDER — SODIUM CHLORIDE 0.9 % IV SOLN: 500.0000 mL | INTRAVENOUS | Status: DC

## 2014-04-25 NOTE — Progress Notes (Signed)
Recent seizure last week with no jerking movements . Wife said he spaces out briefly then speech garbled afterwards, therefore back to original dose of Keppra

## 2014-04-25 NOTE — Op Note (Signed)
[place]  [address] [place], [address]   ENDOSCOPY PROCEDURE REPORT  PATIENT: Jeff, Weaver  MR#: [id number] BIRTHDATE: [birth year] , 56  yrs. old GENDER: male ENDOSCOPIST: Lafayette Dragon, MD REFERRED BY:  Unice Cobble, M.D. PROCEDURE DATE:  04/25/2014 PROCEDURE:  EGD w/ biopsy ASA CLASS:     Class III INDICATIONS:  dyspepsia.  Crohn's disease in remission. New diagnosis of cholelithiasis.  Patient complaints of bloating and gas. MEDICATIONS: Monitored anesthesia care and Propofol 220 mg IV TOPICAL ANESTHETIC: none  DESCRIPTION OF PROCEDURE: After the risks benefits and alternatives of the procedure were thoroughly explained, informed consent was obtained.  The LB [id number] endoscope was introduced through the mouth and advanced to the second portion of the duodenum , Without limitations.  The instrument was slowly withdrawn as the mucosa was fully examined.    Esophagus: proximal, mid and distal esophagus was normal. There was benign appearing normal obstructing fibrous stricture which allowed the endoscope to traverse into hiatal hernia Stomach: there was a small 2 cm hernia which was reducible. Gastric folds were normal. There were several fundic gland polyps which were biopsied. Gastric outlet was normal. Retroflexion of the endoscope revealed normal fundus and cardia Duodenum: duodenal bulb and descending duodenum was normal[ The scope was then withdrawn from the patient and the procedure completed.  COMPLICATIONS: There were no immediate complications.  ENDOSCOPIC IMPRESSION:  1. nonobstructing esophageal fibrous stricture 2. small 2 cm hiatal hernia 3. several fundic gland gastric polyps biopsied  RECOMMENDATIONS: 1.  Await pathology results 2.  Anti-reflux regimen to be follow 3.  Continue PPI 4.  Since the ultrasound of the gallbladder does not show any inflammatory changes around the gallbladder we will proceed with HIDA  scan to assess the function of the gallbladder.  Patient is at high risk for general anesthesia and therefore assessing the gallbladder function may be appropriate before considering surgical consultation  REPEAT EXAM: for EGD pending biopsy results.  eSigned:  Lafayette Dragon, MD 04/25/2014 3:45 PM    CC:  PATIENT NAME:  Jeff, Weaver MR#: [id number]

## 2014-04-25 NOTE — Patient Instructions (Signed)
YOU HAD AN ENDOSCOPIC PROCEDURE TODAY AT [facility]: Refer to the procedure report that was given to you for any specific questions about what was found during the examination.  If the procedure report does not answer your questions, please call your gastroenterologist to clarify.  If you requested that your care partner not be given the details of your procedure findings, then the procedure report has been included in a sealed envelope for you to review at your convenience later.  YOU SHOULD EXPECT: Some feelings of bloating in the abdomen. Passage of more gas than usual.  Walking can help get rid of the air that was put into your GI tract during the procedure and reduce the bloating. If you had a lower endoscopy (such as a colonoscopy or flexible sigmoidoscopy) you may notice spotting of blood in your stool or on the toilet paper. If you underwent a bowel prep for your procedure, then you may not have a normal bowel movement for a few days.  DIET: Your first meal following the procedure should be a light meal and then it is ok to progress to your normal diet.  A half-sandwich or bowl of soup is an example of a good first meal.  Heavy or fried foods are harder to digest and may make you feel nauseous or bloated.  Likewise meals heavy in dairy and vegetables can cause extra gas to form and this can also increase the bloating.  Drink plenty of fluids but you should avoid alcoholic beverages for 24 hours.  ACTIVITY: Your care partner should take you home directly after the procedure.  You should plan to take it easy, moving slowly for the rest of the day.  You can resume normal activity the day after the procedure however you should NOT DRIVE or use heavy machinery for 24 hours (because of the sedation medicines used during the test).    SYMPTOMS TO REPORT IMMEDIATELY: A gastroenterologist can be reached at any hour.  During normal business hours, 8:30 AM to 5:00 PM Monday through Friday,  call [phone].  After hours and on weekends, please call the GI answering service at [phone] who will take a message and have the physician on call contact you.     Following upper endoscopy (EGD)  Vomiting of blood or coffee ground material  New chest pain or pain under the shoulder blades  Painful or persistently difficult swallowing  New shortness of breath  Fever of 100F or higher  Black, tarry-looking stools  FOLLOW UP: If any biopsies were taken you will be contacted by phone or by letter within the next 1-3 weeks.  Call your gastroenterologist if you have not heard about the biopsies in 3 weeks.  Our staff will call the home number listed on your records the next business day following your procedure to check on you and address any questions or concerns that you may have at that time regarding the information given to you following your procedure. This is a courtesy call and so if there is no answer at the home number and we have not heard from you through the emergency physician on call, we will assume that you have returned to your regular daily activities without incident.  SIGNATURES/CONFIDENTIALITY: You and/or your care partner have signed paperwork which will be entered into your electronic medical record.  These signatures attest to the fact that that the information above on your After Visit Summary has been reviewed and is understood.  Full responsibility of the confidentiality of this discharge information lies with you and/or your care-partner.  Stricture and hiatal hernia information given.  Follow anti reflux regimen .  HIDA scan October 30 at [facility]. Do not eat after 7AM on the 30th. Do not take any pain medication after midnight before the scan. Please arrive at [facility] at 12:45 PM October 30th.

## 2014-04-25 NOTE — Progress Notes (Signed)
Procedure ends, to recovery, report given and VSS. 

## 2014-04-25 NOTE — Progress Notes (Signed)
Called to room to assist during endoscopic procedure.  Patient ID and intended procedure confirmed with present staff. Received instructions for my participation in the procedure from the performing physician.  

## 2014-04-25 NOTE — Telephone Encounter (Signed)
Per Dr. Olevia Perches, needs a HIDA scan. Scheduled at [facility] radiology on 05/02/14 at 1:00 PM. NPO 6 hours prior(7 AM). No pain medications. Spoke with Olivia Mackie in [facility] recovery and gave her appointment and instructions to give to patient.

## 2014-04-27 ENCOUNTER — Other Ambulatory Visit: Payer: Self-pay | Admitting: Internal Medicine

## 2014-04-28 ENCOUNTER — Telehealth: Payer: Self-pay | Admitting: *Deleted

## 2014-04-28 NOTE — Telephone Encounter (Signed)
  Follow up Call-  Call back number 04/25/2014 04/10/2013  Post procedure Call Back phone  # [phone]  Permission to leave phone message Yes Yes     Patient questions:  Do you have a fever, pain , or abdominal swelling? No. Pain Score  0 *  Have you tolerated food without any problems? Yes.    Have you been able to return to your normal activities? Yes.    Do you have any questions about your discharge instructions: Diet   No. Medications  No. Follow up visit  No.  Do you have questions or concerns about your Care? No.  Actions: * If pain score is 4 or above: No action needed, pain <4.

## 2014-04-28 NOTE — Telephone Encounter (Signed)
Rx sent 

## 2014-04-29 ENCOUNTER — Encounter: Payer: Self-pay | Admitting: Internal Medicine

## 2014-05-02 ENCOUNTER — Ambulatory Visit (HOSPITAL_COMMUNITY)
Admission: RE | Admit: 2014-05-02 | Discharge: 2014-05-02 | Disposition: A | Discharge status: Home / Self Care | Payer: Medicare Other | Source: Ambulatory Visit | Attending: Internal Medicine | Admitting: Internal Medicine

## 2014-05-02 ENCOUNTER — Other Ambulatory Visit: Payer: Self-pay | Admitting: Internal Medicine

## 2014-05-02 DIAGNOSIS — R109 Unspecified abdominal pain: Secondary | ICD-10-CM | POA: Insufficient documentation

## 2014-05-02 DIAGNOSIS — R140 Abdominal distension (gaseous): Secondary | ICD-10-CM | POA: Insufficient documentation

## 2014-05-02 DIAGNOSIS — K8020 Calculus of gallbladder without cholecystitis without obstruction: Secondary | ICD-10-CM

## 2014-05-02 MED ORDER — TECHNETIUM TC 99M MEBROFENIN IV KIT
5.4000 | PACK | Freq: Once | INTRAVENOUS | Status: AC | PRN
  Administered 2014-05-02: 5 via INTRAVENOUS

## 2014-05-13 ENCOUNTER — Other Ambulatory Visit: Payer: Self-pay

## 2014-05-13 MED ORDER — FLUTICASONE PROPIONATE 50 MCG/ACT NA SUSP: 1.0000 | Freq: Two times a day (BID) | NASAL | Status: DC

## 2014-05-21 ENCOUNTER — Ambulatory Visit (INDEPENDENT_AMBULATORY_CARE_PROVIDER_SITE_OTHER): Payer: Medicare Other | Admitting: Internal Medicine

## 2014-05-21 ENCOUNTER — Other Ambulatory Visit (INDEPENDENT_AMBULATORY_CARE_PROVIDER_SITE_OTHER): Payer: Medicare Other

## 2014-05-21 ENCOUNTER — Encounter: Payer: Self-pay | Admitting: Internal Medicine

## 2014-05-21 VITALS — BP 100/80 | HR 61 | Temp 98.1°F | Resp 14 | Wt 244.1 lb

## 2014-05-21 DIAGNOSIS — F05 Delirium due to known physiological condition: Secondary | ICD-10-CM

## 2014-05-21 DIAGNOSIS — M2550 Pain in unspecified joint: Secondary | ICD-10-CM | POA: Diagnosis not present

## 2014-05-21 DIAGNOSIS — M609 Myositis, unspecified: Secondary | ICD-10-CM | POA: Diagnosis not present

## 2014-05-21 DIAGNOSIS — Z8739 Personal history of other diseases of the musculoskeletal system and connective tissue: Secondary | ICD-10-CM | POA: Diagnosis not present

## 2014-05-21 DIAGNOSIS — R739 Hyperglycemia, unspecified: Secondary | ICD-10-CM | POA: Diagnosis not present

## 2014-05-21 DIAGNOSIS — M791 Myalgia: Secondary | ICD-10-CM

## 2014-05-21 DIAGNOSIS — IMO0001 Reserved for inherently not codable concepts without codable children: Secondary | ICD-10-CM

## 2014-05-21 DIAGNOSIS — E785 Hyperlipidemia, unspecified: Secondary | ICD-10-CM | POA: Diagnosis not present

## 2014-05-21 DIAGNOSIS — R410 Disorientation, unspecified: Secondary | ICD-10-CM

## 2014-05-21 LAB — HEPATIC FUNCTION PANEL
ALT: 117 U/L — ABNORMAL HIGH (ref 0–53)
AST: 71 U/L — ABNORMAL HIGH (ref 0–37)
Albumin: 3.2 g/dL — ABNORMAL LOW (ref 3.5–5.2)
Alkaline Phosphatase: 72 U/L (ref 39–117)
Bilirubin, Direct: 0.2 mg/dL (ref 0.0–0.3)
Total Bilirubin: 0.8 mg/dL (ref 0.2–1.2)
Total Protein: 7.2 g/dL (ref 6.0–8.3)

## 2014-05-21 LAB — CBC WITH DIFFERENTIAL/PLATELET
Basophils Absolute: 0 10*3/uL (ref 0.0–0.1)
Basophils Relative: 0.3 % (ref 0.0–3.0)
Eosinophils Absolute: 0.1 10*3/uL (ref 0.0–0.7)
Eosinophils Relative: 0.5 % (ref 0.0–5.0)
HCT: 42.5 % (ref 39.0–52.0)
Hemoglobin: 14.4 g/dL (ref 13.0–17.0)
Lymphocytes Relative: 11.1 % — ABNORMAL LOW (ref 12.0–46.0)
Lymphs Abs: 1.5 10*3/uL (ref 0.7–4.0)
MCHC: 33.8 g/dL (ref 30.0–36.0)
MCV: 91.7 fl (ref 78.0–100.0)
Monocytes Absolute: 0.8 10*3/uL (ref 0.1–1.0)
Monocytes Relative: 6.3 % (ref 3.0–12.0)
Neutro Abs: 10.9 10*3/uL — ABNORMAL HIGH (ref 1.4–7.7)
Neutrophils Relative %: 81.8 % — ABNORMAL HIGH (ref 43.0–77.0)
Platelets: 236 10*3/uL (ref 150.0–400.0)
RBC: 4.64 Mil/uL (ref 4.22–5.81)
RDW: 13.3 % (ref 11.5–15.5)
WBC: 13.3 10*3/uL — ABNORMAL HIGH (ref 4.0–10.5)

## 2014-05-21 LAB — BASIC METABOLIC PANEL
BUN: 17 mg/dL (ref 6–23)
CO2: 32 mEq/L (ref 19–32)
Calcium: 8.7 mg/dL (ref 8.4–10.5)
Chloride: 91 mEq/L — ABNORMAL LOW (ref 96–112)
Creatinine, Ser: 1 mg/dL (ref 0.4–1.5)
GFR: 75.04 mL/min (ref >60.00)
Glucose, Bld: 112 mg/dL — ABNORMAL HIGH (ref 70–99)
Potassium: 3.3 mEq/L — ABNORMAL LOW (ref 3.5–5.1)
Sodium: 133 mEq/L — ABNORMAL LOW (ref 135–145)

## 2014-05-21 LAB — CK: Total CK: 44 U/L (ref 7–232)

## 2014-05-21 LAB — SEDIMENTATION RATE: Sed Rate: 91 mm/hr — ABNORMAL HIGH (ref 0–22)

## 2014-05-21 LAB — RHEUMATOID FACTOR: Rhuematoid fact SerPl-aCnc: 11 IU/mL (ref <=14)

## 2014-05-21 LAB — HEMOGLOBIN A1C: Hgb A1c MFr Bld: 6.2 % (ref 4.6–6.5)

## 2014-05-21 NOTE — Progress Notes (Signed)
Subjective:    Patient ID: Jeff Weaver, male    DOB: 03-15-1946, 68 y.o.   MRN: [id number]  HPI He has multiple concerns. The history is very convoluted and rambling.  Apparently while walking 05/13/14 he developed severe pain in the anterior thighs, mainly distally. This occurred as a cramping pain especially on the left after walking 2.5 miles. He states he had difficulty getting back to the house.  It has resolved but the last 7 days he's been in bed most of the day according to his wife ,sleeping.  He's also had some right shoulder pain which is chronic. He has chronic bilateral hip pain.  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 He does take Tylenol.    Review of Systems   With the above symptoms he's had no appetite until last night when he ate a good dinner.  His wife is concerned disease having variable memory deficits. She states these began after his neurosurgery in 2013.  He forgets the street name on which he lives; pertinent facts about [place] and the names the Presidents on [employer].  She states he's been more lethargic with the confusion.  He will have dreams about conversations & wakes up thinking it was real.  She said at times he will sit and stare  She states that he sees a picture in his head but cannot think of the word for it.  He is followed by Dr. Joesph July Neurologist at [facility]; he has been seen twice over the last few months. His wife is requesting possible referral to a neuropsychologist possibly had a center of excellence. By her diary his last seizure was a "mini seizure" 04/17/14. They discussed this with the nurse practitioner [facility] who adjusted the Keppra dose   The neurologist apparently was concerned about his blood pressure being markedly variable and sometimes too low  During endoscopy his blood pressure remained  low requiring him to remain for a while until it could be restored with treatment  He describes pains in various locations of his chest. He has markedly with a pen       Objective:   Physical Exam   Pertinent or positive findings include: He appears disheveled but not acutely ill He has pattern alopecia. There is slight decreased range of motion of the cervical spine Initially as he gets up from the chair he pushes himself up with his arms. He gave the date as November 17 not 18th, 2015. He was able to name the president. His abdomen is massive with ventral hernia. He has crepitus in the knees  General appearance :adequately nourished; in no distress. Eyes: No conjunctival inflammation or scleral icterus is present. Oral exam: Dental hygiene is good. Lips and gums are healthy appearing.There is no oropharyngeal erythema or exudate noted.  Heart:  Normal rate and regular rhythm. S1 and S2 normal without gallop, murmur, click, rub or other extra sounds  Lungs:Chest clear to auscultation; no wheezes, rhonchi,rales ,or rubs present.No increased work of breathing.  Abdomen: bowel sounds normal, soft and non-tender without masses, organomegaly or hernias noted.  No guarding or rebound.  Vascular : all pulses equal ; no bruits present. Skin:Warm & dry.  Resolving SQ papules below L axilla.No  rashes ; no jaundice or tenting Lymphatic: No lymphadenopathy is noted about the head, neck, axilla  Assessment & Plan:  #1 myalgias in the context of statin therapy  #2 arthralgias, multiple joints. Past medical history rheumatoid arthritis  #3 Subacute confusion with a rambling disjointed history  #4 variable blood pressure  Plan: Statin and amlodipine will be held.  The possibility of rheumatoid arthritis will be assessed with CCP ,RA factor.Ssedimentation rate will be performed to assess for polymyalgia rheumatica  The mental status changes will be assessed with  chemistries, TSH, B12, and RPR. If these are all negative he should be seen at [facility] by his neurologist

## 2014-05-21 NOTE — Progress Notes (Signed)
Pre visit review using our clinic review tool, if applicable. No additional management support is needed unless otherwise documented below in the visit note. 

## 2014-05-21 NOTE — Patient Instructions (Signed)
Your next office appointment will be determined based upon review of your pending labs . Those instructions will be transmitted to you through My Chart  Please report any significant change in your symptoms.  Please stop the amlodipine and pravastatin present. Monitor blood pressure. Minimal Blood Pressure Goal= AVERAGE < 140/90;  Ideal is an AVERAGE < 135/85. This AVERAGE should be calculated from @ least 5-7 BP readings taken @ different times of day on different days of week. You should not respond to isolated BP readings , but rather the AVERAGE for that week .Please bring your  blood pressure cuff to office visits to verify that it is reliable.It  can also be checked against the blood pressure device at the pharmacy. Finger or wrist cuffs are not dependable; an arm cuff is.

## 2014-05-22 LAB — VITAMIN B12: Vitamin B-12: 278 pg/mL (ref 211–911)

## 2014-05-22 LAB — TSH: TSH: 1.57 u[IU]/mL (ref 0.35–4.50)

## 2014-05-22 LAB — RPR

## 2014-05-23 ENCOUNTER — Other Ambulatory Visit: Payer: Self-pay | Admitting: Internal Medicine

## 2014-05-23 DIAGNOSIS — R7989 Other specified abnormal findings of blood chemistry: Secondary | ICD-10-CM

## 2014-05-23 DIAGNOSIS — D72829 Elevated white blood cell count, unspecified: Secondary | ICD-10-CM

## 2014-05-23 DIAGNOSIS — M2550 Pain in unspecified joint: Secondary | ICD-10-CM

## 2014-05-23 DIAGNOSIS — E876 Hypokalemia: Secondary | ICD-10-CM

## 2014-05-23 DIAGNOSIS — E871 Hypo-osmolality and hyponatremia: Secondary | ICD-10-CM

## 2014-05-23 DIAGNOSIS — R945 Abnormal results of liver function studies: Secondary | ICD-10-CM

## 2014-05-23 LAB — CYCLIC CITRUL PEPTIDE ANTIBODY, IGG: Cyclic Citrullin Peptide Ab: <2 U/mL (ref 0.0–5.0)

## 2014-05-23 MED ORDER — PREDNISONE 10 MG PO TABS: ORAL_TABLET | ORAL | Status: DC

## 2014-05-23 MED ORDER — PREDNISONE 10 MG PO TABS
ORAL_TABLET | ORAL | Status: DC
Start: 2014-05-23 — End: 2014-05-28

## 2014-05-23 NOTE — Addendum Note (Signed)
Addended by: Roma Schanz R on: 05/23/2014 08:03 AM   Modules accepted: Orders

## 2014-05-28 ENCOUNTER — Encounter: Payer: Self-pay | Admitting: Internal Medicine

## 2014-05-28 ENCOUNTER — Ambulatory Visit (INDEPENDENT_AMBULATORY_CARE_PROVIDER_SITE_OTHER): Payer: Medicare Other | Admitting: Internal Medicine

## 2014-05-28 ENCOUNTER — Other Ambulatory Visit (INDEPENDENT_AMBULATORY_CARE_PROVIDER_SITE_OTHER): Payer: Medicare Other

## 2014-05-28 VITALS — BP 92/68 | HR 56 | Temp 98.2°F | Wt 252.0 lb

## 2014-05-28 DIAGNOSIS — M2550 Pain in unspecified joint: Secondary | ICD-10-CM

## 2014-05-28 DIAGNOSIS — E871 Hypo-osmolality and hyponatremia: Secondary | ICD-10-CM

## 2014-05-28 DIAGNOSIS — D72829 Elevated white blood cell count, unspecified: Secondary | ICD-10-CM | POA: Diagnosis not present

## 2014-05-28 DIAGNOSIS — M353 Polymyalgia rheumatica: Secondary | ICD-10-CM | POA: Diagnosis not present

## 2014-05-28 DIAGNOSIS — R945 Abnormal results of liver function studies: Secondary | ICD-10-CM

## 2014-05-28 DIAGNOSIS — E876 Hypokalemia: Secondary | ICD-10-CM

## 2014-05-28 DIAGNOSIS — E65 Localized adiposity: Secondary | ICD-10-CM | POA: Diagnosis not present

## 2014-05-28 DIAGNOSIS — R7989 Other specified abnormal findings of blood chemistry: Secondary | ICD-10-CM | POA: Diagnosis not present

## 2014-05-28 DIAGNOSIS — I10 Essential (primary) hypertension: Secondary | ICD-10-CM | POA: Diagnosis not present

## 2014-05-28 DIAGNOSIS — R748 Abnormal levels of other serum enzymes: Secondary | ICD-10-CM

## 2014-05-28 LAB — CBC WITH DIFFERENTIAL/PLATELET
Basophils Absolute: 0 10*3/uL (ref 0.0–0.1)
Basophils Relative: 0.3 % (ref 0.0–3.0)
Eosinophils Absolute: 0.1 10*3/uL (ref 0.0–0.7)
Eosinophils Relative: 0.7 % (ref 0.0–5.0)
HCT: 43.4 % (ref 39.0–52.0)
Hemoglobin: 14.3 g/dL (ref 13.0–17.0)
Lymphocytes Relative: 20.5 % (ref 12.0–46.0)
Lymphs Abs: 2.6 10*3/uL (ref 0.7–4.0)
MCHC: 33 g/dL (ref 30.0–36.0)
MCV: 92.4 fl (ref 78.0–100.0)
Monocytes Absolute: 0.6 10*3/uL (ref 0.1–1.0)
Monocytes Relative: 4.7 % (ref 3.0–12.0)
Neutro Abs: 9.3 10*3/uL — ABNORMAL HIGH (ref 1.4–7.7)
Neutrophils Relative %: 73.8 % (ref 43.0–77.0)
Platelets: 299 10*3/uL (ref 150.0–400.0)
RBC: 4.7 Mil/uL (ref 4.22–5.81)
RDW: 13.7 % (ref 11.5–15.5)
WBC: 12.6 10*3/uL — ABNORMAL HIGH (ref 4.0–10.5)

## 2014-05-28 LAB — BASIC METABOLIC PANEL
BUN: 13 mg/dL (ref 6–23)
CO2: 33 mEq/L — ABNORMAL HIGH (ref 19–32)
Calcium: 9.1 mg/dL (ref 8.4–10.5)
Chloride: 101 mEq/L (ref 96–112)
Creatinine, Ser: 1 mg/dL (ref 0.4–1.5)
GFR: 81.32 mL/min (ref >60.00)
Glucose, Bld: 107 mg/dL — ABNORMAL HIGH (ref 70–99)
Potassium: 4.3 mEq/L (ref 3.5–5.1)
Sodium: 140 mEq/L (ref 135–145)

## 2014-05-28 LAB — SEDIMENTATION RATE: Sed Rate: 29 mm/hr — ABNORMAL HIGH (ref 0–22)

## 2014-05-28 LAB — AST: AST: 17 U/L (ref 0–37)

## 2014-05-28 LAB — ALT: ALT: 54 U/L — ABNORMAL HIGH (ref 0–53)

## 2014-05-28 MED ORDER — LOSARTAN POTASSIUM 50 MG PO TABS: ORAL_TABLET | ORAL | Status: DC

## 2014-05-28 MED ORDER — PREDNISONE 5 MG PO TABS: ORAL_TABLET | ORAL | Status: DC

## 2014-05-28 NOTE — Patient Instructions (Signed)
Eat a low-fat diet with lots of fruits and vegetables, up to 7-9 servings per day. Consume less than 40* Grams (preferably ZERO) of sugar per day from foods & drinks with High Fructose Corn Syrup (HFCS) sugar as #1,2,3 or # 4 on label.[employer], [employer] do not carry products with HFCS. Follow a  low carb nutrition program such as [place] or The New Sugar Busters  to prevent Diabetes progression . White carbohydrates (potatoes, rice, bread, and pasta) have a high spike of sugar and a high load of sugar. For example a  baked potato has a cup of sugar and a  french fry  2 teaspoons of sugar. Yams, wild  rice, whole grained bread &  wheat pasta have been much lower spike and load of  sugar. Portions should be the size of a deck of cards or your palm.

## 2014-05-28 NOTE — Progress Notes (Signed)
Pre visit review using our clinic review tool, if applicable. No additional management support is needed unless otherwise documented below in the visit note. 

## 2014-05-28 NOTE — Progress Notes (Signed)
   Subjective:    Patient ID: Jeff Weaver, male    DOB: [birth year], 68 y.o.   MRN: [id number]  HPI  He is here in F/U of 05/21/14 OV.At that time he had great difficulty giving a focused history. He described diffuse pain syndrome including the knees and right shoulder. He was convinced that he had rheumatoid arthritis.  His sedimentation rate was 91; but his CCP and rheumatoid factor normal.  Sodium was 133; potassium 3.3; AST 71; ALT 117.  He was found to be hypotensive; amlodipine and Microzide were held. He has continued his losartan 100 mg one half daily. He also was told to hold his statin.  At this time there has been dramatic improvement in the labs. Sodium is now 140, potassium 4.3, AST 17, LT 54, and sedimentation rate 29.    Review of Systems   His arthralgia and his pain syndrome is significantly better.  He remains hypotensive.     Objective:   Physical Exam   Positive or pertinent findings include: He is dramatically more clear as to his thought processes, able to give a focused history There is dramatic protuberance of the abdomen with ventral hernia.  General appearance :adequately nourished; in no distress. Eyes: No conjunctival inflammation or scleral icterus is present. Oral exam: Dental hygiene is good. Lips and gums are healthy appearing.There is no oropharyngeal erythema or exudate noted.  Heart:  Normal rate and regular rhythm. S1 and S2 normal without gallop, murmur, click, rub or other extra sounds   Lungs:Chest clear to auscultation; no wheezes, rhonchi,rales ,or rubs present.No increased work of breathing.  Abdomen: bowel sounds normal, soft and non-tender without masses, organomegaly or hernias noted.  No guarding or rebound. No flank tenderness to percussion. Vascular : all pulses equal ; no bruits present. Skin:Warm & dry.  Intact without suspicious lesions or rashes ; no jaundice or tenting Lymphatic: No lymphadenopathy is noted about the head,  neck, axilla            Assessment & Plan:  #1 probable polymyalgia rheumatica  #2 elevated liver enzymes, dramatic improvement off statin  #3 hyponatremia and hypokalemia, resolved  Plan: The steroids will be continued. The amlodipine, Microzide, and higher dose losartan will be discontinued. Losartan will be prescribed 50 mg one half daily with blood pressure monitor.

## 2014-06-12 ENCOUNTER — Encounter (HOSPITAL_COMMUNITY): Payer: Self-pay | Admitting: Cardiology

## 2014-07-14 ENCOUNTER — Ambulatory Visit (INDEPENDENT_AMBULATORY_CARE_PROVIDER_SITE_OTHER): Payer: Medicare Other | Admitting: Internal Medicine

## 2014-07-14 ENCOUNTER — Encounter: Payer: Self-pay | Admitting: Internal Medicine

## 2014-07-14 ENCOUNTER — Other Ambulatory Visit (INDEPENDENT_AMBULATORY_CARE_PROVIDER_SITE_OTHER): Payer: Medicare Other

## 2014-07-14 VITALS — BP 138/80 | HR 62 | Temp 97.8°F | Ht 68.25 in | Wt 252.8 lb

## 2014-07-14 DIAGNOSIS — M353 Polymyalgia rheumatica: Secondary | ICD-10-CM

## 2014-07-14 DIAGNOSIS — R748 Abnormal levels of other serum enzymes: Secondary | ICD-10-CM

## 2014-07-14 DIAGNOSIS — I10 Essential (primary) hypertension: Secondary | ICD-10-CM

## 2014-07-14 DIAGNOSIS — K2100 Gastro-esophageal reflux disease with esophagitis, without bleeding: Secondary | ICD-10-CM

## 2014-07-14 DIAGNOSIS — R7309 Other abnormal glucose: Secondary | ICD-10-CM | POA: Diagnosis not present

## 2014-07-14 DIAGNOSIS — R7303 Prediabetes: Secondary | ICD-10-CM

## 2014-07-14 DIAGNOSIS — K219 Gastro-esophageal reflux disease without esophagitis: Secondary | ICD-10-CM | POA: Insufficient documentation

## 2014-07-14 LAB — CBC WITH DIFFERENTIAL/PLATELET
Basophils Absolute: 0 10*3/uL (ref 0.0–0.1)
Basophils Relative: 0.3 % (ref 0.0–3.0)
Eosinophils Absolute: 0.1 10*3/uL (ref 0.0–0.7)
Eosinophils Relative: 1.2 % (ref 0.0–5.0)
HCT: 43.4 % (ref 39.0–52.0)
Hemoglobin: 14.4 g/dL (ref 13.0–17.0)
Lymphocytes Relative: 23.4 % (ref 12.0–46.0)
Lymphs Abs: 2 10*3/uL (ref 0.7–4.0)
MCHC: 33.1 g/dL (ref 30.0–36.0)
MCV: 93.1 fl (ref 78.0–100.0)
Monocytes Absolute: 0.7 10*3/uL (ref 0.1–1.0)
Monocytes Relative: 7.9 % (ref 3.0–12.0)
Neutro Abs: 5.7 10*3/uL (ref 1.4–7.7)
Neutrophils Relative %: 67.2 % (ref 43.0–77.0)
Platelets: 164 10*3/uL (ref 150.0–400.0)
RBC: 4.67 Mil/uL (ref 4.22–5.81)
RDW: 14.7 % (ref 11.5–15.5)
WBC: 8.5 10*3/uL (ref 4.0–10.5)

## 2014-07-14 LAB — IBC PANEL
Iron: 134 ug/dL (ref 42–165)
Saturation Ratios: 37.2 % (ref 20.0–50.0)
Transferrin: 257.4 mg/dL (ref 212.0–360.0)

## 2014-07-14 LAB — HEPATIC FUNCTION PANEL
ALT: 43 U/L (ref 0–53)
AST: 22 U/L (ref 0–37)
Albumin: 3.8 g/dL (ref 3.5–5.2)
Alkaline Phosphatase: 34 U/L — ABNORMAL LOW (ref 39–117)
Bilirubin, Direct: 0.1 mg/dL (ref 0.0–0.3)
Total Bilirubin: 0.6 mg/dL (ref 0.2–1.2)
Total Protein: 6.6 g/dL (ref 6.0–8.3)

## 2014-07-14 LAB — HEMOGLOBIN A1C: Hgb A1c MFr Bld: 6.7 % — ABNORMAL HIGH (ref 4.6–6.5)

## 2014-07-14 LAB — SEDIMENTATION RATE: Sed Rate: 10 mm/hr (ref 0–22)

## 2014-07-14 NOTE — Assessment & Plan Note (Signed)
LFTs 

## 2014-07-14 NOTE — Assessment & Plan Note (Signed)
A1c

## 2014-07-14 NOTE — Progress Notes (Signed)
   Subjective:    Patient ID: Jeff Weaver, male    DOB: [birth year], 69 y.o.   MRN: [id number]  HPI  He follows up for his polymyalgia rheumatica, hypertension, and reflux.  He has been compliant with his losartan without adverse effect. Blood pressures range 97/52-157/92. He does restrict sodium but is on no other nutritional program.  He walks 2-4 times per week for 2 miles.  He does describe occasional edema of his legs but not the ankles.  Occasionally he'll develop pain in the left knee.  He also has a occasional midabdominal discomfort and some dysphagia 1-2 times per week.  Endoscopy 04/25/14 revealed 2 cm hiatal hernia, fundal gastric polyps, and a fibrous nonobstructing esophageal stricture.  Review of Systems   Chest pain, palpitations, tachycardia, exertional dyspnea, paroxysmal nocturnal dyspnea, or claudication  are absent.  Unexplained weight loss, significant dyspepsia,  melena, rectal bleeding, or persistently small caliber stools are denied.     Objective:   Physical Exam Positive or pertinent findings include: There is erythema of the oropharynx without exudate He has lipomatous change over the upper thoracic spine Abdomen is massive.  General appearance :adequately nourished; in no distress. Eyes: No conjunctival inflammation or scleral icterus is present. Oral exam: Dental hygiene is good. Lips and gums are healthy appearing. Heart:  Normal rate and regular rhythm. S1 and S2 normal without gallop, murmur, click, rub or other extra sounds   Lungs:Chest clear to auscultation; no wheezes, rhonchi,rales ,or rubs present.No increased work of breathing.  Abdomen: bowel sounds normal, soft and non-tender without masses, organomegaly or hernias noted.  No guarding or rebound. Vascular : all pulses equal ; no bruits present. Skin:Warm & dry.  Intact without suspicious lesions or rashes ; no jaundice or tenting Lymphatic: No lymphadenopathy is noted about the head,  neck, axilla        Assessment & Plan:  See Current Assessment & Plan in Problem List under specific Diagnosis

## 2014-07-14 NOTE — Patient Instructions (Signed)
Your next office appointment will be determined based upon review of your pending labs . Those instructions will come through My chart. Please report any significant change in your symptoms.Reflux of gastric acid may be asymptomatic as this may occur mainly during sleep.The triggers for reflux  include stress; the "aspirin family" ; alcohol; peppermint; and caffeine (coffee, tea, cola, and chocolate). The aspirin family would include aspirin and the nonsteroidal agents such as ibuprofen &  Naproxen. Tylenol would not cause reflux. If having symptoms ; food & drink should be avoided for @ least 2 hours before going to bed.

## 2014-07-14 NOTE — Assessment & Plan Note (Signed)
CBC  Avoid the triggers for reflux which  include; the "aspirin family" ; alcohol; peppermint; and caffeine (coffee, tea, cola, and chocolate). The aspirin family would include aspirin and the nonsteroidal agents such as ibuprofen &  Naproxen. Tylenol would not cause reflux. If having symptoms ; food & drink should be avoided for @ least 2 hours before going to bed.

## 2014-07-14 NOTE — Progress Notes (Signed)
Pre visit review using our clinic review tool, if applicable. No additional management support is needed unless otherwise documented below in the visit note. 

## 2014-07-14 NOTE — Assessment & Plan Note (Signed)
Sed rate

## 2014-07-28 ENCOUNTER — Other Ambulatory Visit: Payer: Self-pay | Admitting: Internal Medicine

## 2014-08-04 DIAGNOSIS — R351 Nocturia: Secondary | ICD-10-CM | POA: Diagnosis not present

## 2014-08-04 DIAGNOSIS — R3915 Urgency of urination: Secondary | ICD-10-CM | POA: Diagnosis not present

## 2014-08-04 DIAGNOSIS — N401 Enlarged prostate with lower urinary tract symptoms: Secondary | ICD-10-CM | POA: Diagnosis not present

## 2014-08-04 DIAGNOSIS — N138 Other obstructive and reflux uropathy: Secondary | ICD-10-CM | POA: Insufficient documentation

## 2014-08-07 DIAGNOSIS — H524 Presbyopia: Secondary | ICD-10-CM | POA: Diagnosis not present

## 2014-08-07 DIAGNOSIS — H2510 Age-related nuclear cataract, unspecified eye: Secondary | ICD-10-CM | POA: Diagnosis not present

## 2014-08-07 DIAGNOSIS — H5203 Hypermetropia, bilateral: Secondary | ICD-10-CM | POA: Diagnosis not present

## 2014-08-07 DIAGNOSIS — H52223 Regular astigmatism, bilateral: Secondary | ICD-10-CM | POA: Diagnosis not present

## 2014-08-31 ENCOUNTER — Other Ambulatory Visit: Payer: Self-pay | Admitting: Internal Medicine

## 2014-09-01 ENCOUNTER — Other Ambulatory Visit: Payer: Self-pay | Admitting: Internal Medicine

## 2014-09-01 DIAGNOSIS — Z8673 Personal history of transient ischemic attack (TIA), and cerebral infarction without residual deficits: Secondary | ICD-10-CM | POA: Diagnosis not present

## 2014-09-01 DIAGNOSIS — J90 Pleural effusion, not elsewhere classified: Secondary | ICD-10-CM | POA: Diagnosis not present

## 2014-09-01 DIAGNOSIS — R443 Hallucinations, unspecified: Secondary | ICD-10-CM | POA: Diagnosis not present

## 2014-09-01 DIAGNOSIS — G40411 Other generalized epilepsy and epileptic syndromes, intractable, with status epilepticus: Secondary | ICD-10-CM | POA: Diagnosis not present

## 2014-09-01 DIAGNOSIS — J189 Pneumonia, unspecified organism: Secondary | ICD-10-CM | POA: Diagnosis not present

## 2014-09-01 DIAGNOSIS — I10 Essential (primary) hypertension: Secondary | ICD-10-CM | POA: Diagnosis not present

## 2014-09-01 DIAGNOSIS — G9349 Other encephalopathy: Secondary | ICD-10-CM | POA: Diagnosis not present

## 2014-09-01 DIAGNOSIS — J690 Pneumonitis due to inhalation of food and vomit: Secondary | ICD-10-CM | POA: Diagnosis not present

## 2014-09-01 DIAGNOSIS — M353 Polymyalgia rheumatica: Secondary | ICD-10-CM | POA: Diagnosis not present

## 2014-09-01 DIAGNOSIS — G4733 Obstructive sleep apnea (adult) (pediatric): Secondary | ICD-10-CM | POA: Diagnosis present

## 2014-09-01 DIAGNOSIS — G40909 Epilepsy, unspecified, not intractable, without status epilepticus: Secondary | ICD-10-CM | POA: Diagnosis not present

## 2014-09-01 DIAGNOSIS — R451 Restlessness and agitation: Secondary | ICD-10-CM | POA: Diagnosis not present

## 2014-09-01 DIAGNOSIS — G40911 Epilepsy, unspecified, intractable, with status epilepticus: Secondary | ICD-10-CM | POA: Diagnosis not present

## 2014-09-01 DIAGNOSIS — G9340 Encephalopathy, unspecified: Secondary | ICD-10-CM | POA: Diagnosis not present

## 2014-09-01 DIAGNOSIS — G40901 Epilepsy, unspecified, not intractable, with status epilepticus: Secondary | ICD-10-CM | POA: Diagnosis not present

## 2014-09-01 DIAGNOSIS — Z6841 Body Mass Index (BMI) 40.0 and over, adult: Secondary | ICD-10-CM | POA: Diagnosis not present

## 2014-09-01 DIAGNOSIS — R4182 Altered mental status, unspecified: Secondary | ICD-10-CM | POA: Diagnosis not present

## 2014-09-01 DIAGNOSIS — R569 Unspecified convulsions: Secondary | ICD-10-CM | POA: Diagnosis not present

## 2014-09-01 MED ORDER — PREDNISONE 5 MG PO TABS: ORAL_TABLET | ORAL | Status: DC

## 2014-09-01 NOTE — Telephone Encounter (Signed)
Dose decreased ; needs sed rate in April

## 2014-09-01 NOTE — Telephone Encounter (Signed)
Rx sent for Pantoprazole (PROTONIX), 40 mg, #30, three refills to [facility] at [address] in [place], [place].

## 2014-09-01 NOTE — Telephone Encounter (Signed)
I sent prednisone Rx ; dose decreased to 10 mg qd

## 2014-09-01 NOTE — Telephone Encounter (Signed)
For what medication?

## 2014-09-01 NOTE — Telephone Encounter (Signed)
07/14/14 patient last seen

## 2014-09-05 ENCOUNTER — Encounter: Payer: Self-pay | Admitting: Internal Medicine

## 2014-09-05 ENCOUNTER — Ambulatory Visit (INDEPENDENT_AMBULATORY_CARE_PROVIDER_SITE_OTHER): Payer: Medicare Other | Admitting: Internal Medicine

## 2014-09-05 VITALS — BP 118/80 | HR 63 | Temp 97.9°F | Ht 68.0 in | Wt 253.5 lb

## 2014-09-05 DIAGNOSIS — R0989 Other specified symptoms and signs involving the circulatory and respiratory systems: Secondary | ICD-10-CM | POA: Diagnosis not present

## 2014-09-05 DIAGNOSIS — G40909 Epilepsy, unspecified, not intractable, without status epilepticus: Secondary | ICD-10-CM | POA: Diagnosis not present

## 2014-09-05 DIAGNOSIS — J989 Respiratory disorder, unspecified: Secondary | ICD-10-CM

## 2014-09-05 DIAGNOSIS — I10 Essential (primary) hypertension: Secondary | ICD-10-CM | POA: Diagnosis not present

## 2014-09-05 MED ORDER — LOSARTAN POTASSIUM 100 MG PO TABS: 100.0000 mg | ORAL_TABLET | Freq: Every day | ORAL | Status: DC

## 2014-09-05 MED ORDER — NIFEDIPINE ER OSMOTIC RELEASE 30 MG PO TB24: 30.0000 mg | ORAL_TABLET | Freq: Every day | ORAL | Status: DC

## 2014-09-05 NOTE — Progress Notes (Signed)
Pre visit review using our clinic review tool, if applicable. No additional management support is needed unless otherwise documented below in the visit note. 

## 2014-09-05 NOTE — Patient Instructions (Signed)
Symbicort 160/4.5 two inhalations every 12 hours; gargle and spit after use ( Lot [id number]; Exp 08/2015).  Minimal Blood Pressure Goal= AVERAGE < 140/90;  Ideal is an AVERAGE < 135/85. This AVERAGE should be calculated from @ least 5-7 BP readings taken @ different times of day on different days of week. You should not respond to isolated BP readings , but rather the AVERAGE for that week .Please bring your  blood pressure cuff to office visits to verify that it is reliable.It  can also be checked against the blood pressure device at the pharmacy. Finger or wrist cuffs are not dependable; an arm cuff is.

## 2014-09-05 NOTE — Progress Notes (Signed)
   Subjective:    Patient ID: Jeff Weaver, male    DOB: 1945-09-07, 69 y.o.   MRN: [id number]  HPI  He was hospitalized at [facility] 2/29-09/04/14 after having seizures at home. He actually had 2 seizures at home;1 in the ambulance; & 1 in the ER.  Hospital data reviewed.There was a question of possible aspiration in the context of the seizures .CXR 3/1 revealed perihilar airspace opacities ; R/O multifocal PNA. WBC up to 13.7 with L shift.He was on antibiotics while hospitalized.  He had significant hypertension prompting the addition of Procardia XL 30 daily.  His dose of Keppra was increased to 1000 mg twice a day. Random glucoses 90-185. AST 39 & ALT 54 on 09/01/14. 115 red cells in urine w/o leuks or nitrates.  Additionally he has been  prescribed citalopram 10 mg daily by the  [facility].  He states he walks 5 days a week 1-2 hours, albeit slowly. He has limitations related to "knee cramps".      Review of Systems He's had no seizures since being home. He has a small lesion over the left lateral thorax which sometimes is productive of clear secretions. This is been present for 3 months        Objective:   Physical Exam  Pertinent or positive findings include: Nares boggy. He has scattered low-grade extra wheezing anteriorly only. Abdomen is massive and protuberant.  He has trace edema at the sock line.  There is a 5 x 4 mm denuded area at the left anterior axillary area with minimal cellulitis surrounding it. There is no pustule formation. He also has a small mucocele @ the left lower lid.   General appearance :adequately nourished; in no distress. BMI: 38.55 Eyes: No conjunctival inflammation or scleral icterus is present. Oral exam:  Lips and gums are healthy appearing.There is no oropharyngeal erythema or exudate noted. Dental hygiene is good. Heart:  Normal rate and regular rhythm. S1 and S2 normal without gallop, murmur, click, rub or other extra  sounds   Lungs:No increased work of breathing.  Abdomen: bowel sounds normal, soft and non-tender without masses, organomegaly or hernias noted.  No guarding or rebound.  Vascular : all pulses equal ; no bruits present. Skin:Warm & dry.  Intact without suspicious lesions or rashes ; no tenting or jaundice  Lymphatic: No lymphadenopathy is noted about the head, neck, axilla Neuro: Strength, tone  Normal. 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

## 2014-09-15 ENCOUNTER — Ambulatory Visit: Payer: Medicare Other | Admitting: Internal Medicine

## 2014-09-22 ENCOUNTER — Other Ambulatory Visit: Payer: Self-pay

## 2014-09-22 DIAGNOSIS — I10 Essential (primary) hypertension: Secondary | ICD-10-CM

## 2014-09-22 MED ORDER — NIFEDIPINE ER OSMOTIC RELEASE 30 MG PO TB24: 30.0000 mg | ORAL_TABLET | Freq: Every day | ORAL | Status: DC

## 2014-09-22 MED ORDER — LOSARTAN POTASSIUM 100 MG PO TABS: 100.0000 mg | ORAL_TABLET | Freq: Every day | ORAL | Status: DC

## 2014-09-30 ENCOUNTER — Other Ambulatory Visit: Payer: Self-pay | Admitting: Internal Medicine

## 2014-09-30 DIAGNOSIS — M353 Polymyalgia rheumatica: Secondary | ICD-10-CM

## 2014-09-30 NOTE — Telephone Encounter (Signed)
Noted and sed rate ordered

## 2014-09-30 NOTE — Telephone Encounter (Signed)
Ok # 30;needs sed rate before next refill

## 2014-10-01 DIAGNOSIS — G40209 Localization-related (focal) (partial) symptomatic epilepsy and epileptic syndromes with complex partial seizures, not intractable, without status epilepticus: Secondary | ICD-10-CM | POA: Diagnosis not present

## 2014-10-17 DIAGNOSIS — H25813 Combined forms of age-related cataract, bilateral: Secondary | ICD-10-CM | POA: Diagnosis not present

## 2014-10-17 DIAGNOSIS — H35363 Drusen (degenerative) of macula, bilateral: Secondary | ICD-10-CM | POA: Diagnosis not present

## 2014-10-21 ENCOUNTER — Encounter: Payer: Self-pay | Admitting: Internal Medicine

## 2014-10-22 ENCOUNTER — Telehealth: Payer: Self-pay | Admitting: *Deleted

## 2014-10-22 MED ORDER — RANITIDINE HCL 150 MG PO CAPS: ORAL_CAPSULE | ORAL | Status: DC

## 2014-10-22 NOTE — Telephone Encounter (Signed)
Jeff Weaver, [place] Male 1945-11-07 [id number] [place] [id number] [phone] (Home) *Preferred* [phone] (Mobile)      Zantac 150 mg  Received: Today    Lafayette Dragon, MD  Hulan Saas, RN           [name], could you, please, send Ranitidine 150 mg, #30, 1 po q Am, 1 refill?? Also he needs an appointment in June 2016. Thanx DB   Rx sent to pharmacy. Left a message for patient to call back.

## 2014-10-23 NOTE — Telephone Encounter (Signed)
Patient's wife left a message for me to call back and leave her a message on the answering machine with recommendations. Left a message that rx has been sent and she should call to schedule an OV in June 2016.

## 2014-11-09 ENCOUNTER — Other Ambulatory Visit: Payer: Self-pay | Admitting: Internal Medicine

## 2014-11-10 ENCOUNTER — Other Ambulatory Visit: Payer: Self-pay | Admitting: Internal Medicine

## 2014-11-10 DIAGNOSIS — M353 Polymyalgia rheumatica: Secondary | ICD-10-CM

## 2014-11-10 DIAGNOSIS — R7303 Prediabetes: Secondary | ICD-10-CM

## 2014-11-10 NOTE — Telephone Encounter (Signed)
He needs repeat non fasting labs & OV

## 2014-11-13 ENCOUNTER — Ambulatory Visit (INDEPENDENT_AMBULATORY_CARE_PROVIDER_SITE_OTHER): Payer: Medicare Other | Admitting: Internal Medicine

## 2014-11-13 ENCOUNTER — Other Ambulatory Visit (INDEPENDENT_AMBULATORY_CARE_PROVIDER_SITE_OTHER): Payer: Medicare Other

## 2014-11-13 ENCOUNTER — Encounter: Payer: Self-pay | Admitting: Internal Medicine

## 2014-11-13 VITALS — BP 120/70 | HR 60 | Temp 98.1°F | Resp 16 | Ht 68.0 in | Wt 253.5 lb

## 2014-11-13 DIAGNOSIS — R7309 Other abnormal glucose: Secondary | ICD-10-CM | POA: Diagnosis not present

## 2014-11-13 DIAGNOSIS — E119 Type 2 diabetes mellitus without complications: Secondary | ICD-10-CM | POA: Diagnosis not present

## 2014-11-13 DIAGNOSIS — M353 Polymyalgia rheumatica: Secondary | ICD-10-CM | POA: Diagnosis not present

## 2014-11-13 DIAGNOSIS — R7303 Prediabetes: Secondary | ICD-10-CM

## 2014-11-13 LAB — MICROALBUMIN / CREATININE URINE RATIO
Creatinine,U: 48.7 mg/dL
Microalb Creat Ratio: 2.1 mg/g (ref 0.0–30.0)
Microalb, Ur: 1 mg/dL (ref 0.0–1.9)

## 2014-11-13 LAB — SEDIMENTATION RATE: Sed Rate: 11 mm/hr (ref 0–22)

## 2014-11-13 LAB — HEMOGLOBIN A1C: Hgb A1c MFr Bld: 6.2 % (ref 4.6–6.5)

## 2014-11-13 MED ORDER — CITALOPRAM HYDROBROMIDE 20 MG PO TABS: ORAL_TABLET | ORAL | Status: DC

## 2014-11-13 NOTE — Patient Instructions (Signed)
  Your next office appointment will be determined based upon review of your pending labs.  Those instructions will be transmitted to you by My Chart    Critical results will be called.   Followup as needed for any active or acute issue. Please report any significant change in your symptoms.

## 2014-11-13 NOTE — Progress Notes (Signed)
   Subjective:    Patient ID: Jeff Weaver, male    DOB: [birth year], 69 y.o.   MRN: [id number]  HPI  His PMR is essentially asymptomstic on 7.5 mg of prednisone. In mid November 2015 he was started on 15 mg and has been tapered over the last 6 months to this dose. He is having some weight gain which is of concern to them.  He states he walks 2-3 miles 6 days a week without cardio pulmonary symptoms except for discomfort in both legs. He states that as a level VI for 15 minutes but resolved after he continues to walk  His A1c was 6.7% in January of this year. He is not checking sugars at home.  Review of Systems  He denies fever, chills, sweats.  He has no numbness, tingling, burning in extremities.  He has no nonhealing skin lesions.      Objective:   Physical Exam Pertinent or positive findings include: Heart sounds are distant.  Gynecomastia suggested.  His abdomen is massive.  General appearance :adequately nourished; in no distress. Eyes: No conjunctival inflammation or scleral icterus is present. Heart:  Normal rate and regular rhythm. S1 and S2 normal without gallop, murmur, click, rub or other extra sounds   Lungs:Chest clear to auscultation; no wheezes, rhonchi,rales ,or rubs present.No increased work of breathing.  Abdomen: bowel sounds normal, soft and non-tender without masses, organomegaly or hernias noted.  No guarding or rebound.  Vascular : all pulses equal ; no bruits present. Skin:Warm & dry.  Intact without suspicious lesions or rashes ; no tenting or jaundice  Lymphatic: No lymphadenopathy is noted about the head, neck, axilla Neuro: Strength, tone normal.        Assessment & Plan:  #1 polymyalgia rheumatica  #2 diabetes  Plan: See orders

## 2014-11-13 NOTE — Progress Notes (Signed)
Pre visit review using our clinic review tool, if applicable. No additional management support is needed unless otherwise documented below in the visit note. 

## 2014-11-14 ENCOUNTER — Telehealth: Payer: Self-pay

## 2014-11-14 ENCOUNTER — Other Ambulatory Visit: Payer: Self-pay | Admitting: Internal Medicine

## 2014-11-14 MED ORDER — PREDNISONE 5 MG PO TABS: ORAL_TABLET | ORAL | Status: DC

## 2014-11-14 NOTE — Telephone Encounter (Signed)
Patient has been advised

## 2014-11-14 NOTE — Telephone Encounter (Signed)
-----   Message from Hendricks Limes, MD sent at 11/14/2014  1:17 PM EDT ----- Prednisone dose for next 3 months will be 7.5 mg daily (1 & 1/2  Of 5 mg daily) NOT 5 mg daily as My Chart results stated

## 2014-12-05 DIAGNOSIS — R569 Unspecified convulsions: Secondary | ICD-10-CM | POA: Diagnosis not present

## 2014-12-05 DIAGNOSIS — G9389 Other specified disorders of brain: Secondary | ICD-10-CM | POA: Diagnosis not present

## 2014-12-05 DIAGNOSIS — R4182 Altered mental status, unspecified: Secondary | ICD-10-CM | POA: Diagnosis not present

## 2014-12-06 DIAGNOSIS — Z8679 Personal history of other diseases of the circulatory system: Secondary | ICD-10-CM | POA: Diagnosis not present

## 2014-12-06 DIAGNOSIS — G4733 Obstructive sleep apnea (adult) (pediatric): Secondary | ICD-10-CM | POA: Diagnosis present

## 2014-12-06 DIAGNOSIS — F068 Other specified mental disorders due to known physiological condition: Secondary | ICD-10-CM | POA: Diagnosis not present

## 2014-12-06 DIAGNOSIS — N139 Obstructive and reflux uropathy, unspecified: Secondary | ICD-10-CM | POA: Diagnosis present

## 2014-12-06 DIAGNOSIS — K219 Gastro-esophageal reflux disease without esophagitis: Secondary | ICD-10-CM | POA: Diagnosis present

## 2014-12-06 DIAGNOSIS — T380X5A Adverse effect of glucocorticoids and synthetic analogues, initial encounter: Secondary | ICD-10-CM | POA: Diagnosis present

## 2014-12-06 DIAGNOSIS — Z888 Allergy status to other drugs, medicaments and biological substances status: Secondary | ICD-10-CM | POA: Diagnosis not present

## 2014-12-06 DIAGNOSIS — Z6837 Body mass index (BMI) 37.0-37.9, adult: Secondary | ICD-10-CM | POA: Diagnosis not present

## 2014-12-06 DIAGNOSIS — E785 Hyperlipidemia, unspecified: Secondary | ICD-10-CM | POA: Diagnosis present

## 2014-12-06 DIAGNOSIS — E669 Obesity, unspecified: Secondary | ICD-10-CM | POA: Diagnosis present

## 2014-12-06 DIAGNOSIS — G40901 Epilepsy, unspecified, not intractable, with status epilepticus: Secondary | ICD-10-CM | POA: Diagnosis present

## 2014-12-06 DIAGNOSIS — G40119 Localization-related (focal) (partial) symptomatic epilepsy and epileptic syndromes with simple partial seizures, intractable, without status epilepticus: Secondary | ICD-10-CM | POA: Diagnosis not present

## 2014-12-06 DIAGNOSIS — I878 Other specified disorders of veins: Secondary | ICD-10-CM | POA: Diagnosis present

## 2014-12-06 DIAGNOSIS — I10 Essential (primary) hypertension: Secondary | ICD-10-CM | POA: Diagnosis not present

## 2014-12-06 DIAGNOSIS — Z87891 Personal history of nicotine dependence: Secondary | ICD-10-CM | POA: Diagnosis not present

## 2014-12-06 DIAGNOSIS — Z9989 Dependence on other enabling machines and devices: Secondary | ICD-10-CM | POA: Diagnosis not present

## 2014-12-06 DIAGNOSIS — R569 Unspecified convulsions: Secondary | ICD-10-CM | POA: Diagnosis not present

## 2014-12-06 DIAGNOSIS — D72823 Leukemoid reaction: Secondary | ICD-10-CM | POA: Diagnosis not present

## 2014-12-06 DIAGNOSIS — Z8673 Personal history of transient ischemic attack (TIA), and cerebral infarction without residual deficits: Secondary | ICD-10-CM | POA: Diagnosis not present

## 2014-12-06 DIAGNOSIS — Z982 Presence of cerebrospinal fluid drainage device: Secondary | ICD-10-CM | POA: Diagnosis not present

## 2014-12-06 DIAGNOSIS — R05 Cough: Secondary | ICD-10-CM | POA: Diagnosis not present

## 2014-12-06 DIAGNOSIS — I671 Cerebral aneurysm, nonruptured: Secondary | ICD-10-CM | POA: Diagnosis not present

## 2014-12-06 DIAGNOSIS — D72829 Elevated white blood cell count, unspecified: Secondary | ICD-10-CM | POA: Diagnosis present

## 2014-12-06 DIAGNOSIS — Z7952 Long term (current) use of systemic steroids: Secondary | ICD-10-CM | POA: Diagnosis not present

## 2014-12-06 DIAGNOSIS — N401 Enlarged prostate with lower urinary tract symptoms: Secondary | ICD-10-CM | POA: Diagnosis present

## 2014-12-06 DIAGNOSIS — M353 Polymyalgia rheumatica: Secondary | ICD-10-CM | POA: Diagnosis present

## 2014-12-06 DIAGNOSIS — Z79899 Other long term (current) drug therapy: Secondary | ICD-10-CM | POA: Diagnosis not present

## 2014-12-06 DIAGNOSIS — G9349 Other encephalopathy: Secondary | ICD-10-CM | POA: Diagnosis present

## 2014-12-06 DIAGNOSIS — R4701 Aphasia: Secondary | ICD-10-CM | POA: Diagnosis present

## 2014-12-07 DIAGNOSIS — G9349 Other encephalopathy: Secondary | ICD-10-CM | POA: Diagnosis not present

## 2014-12-07 DIAGNOSIS — R569 Unspecified convulsions: Secondary | ICD-10-CM | POA: Diagnosis not present

## 2014-12-07 DIAGNOSIS — E785 Hyperlipidemia, unspecified: Secondary | ICD-10-CM | POA: Diagnosis not present

## 2014-12-07 DIAGNOSIS — R4701 Aphasia: Secondary | ICD-10-CM | POA: Diagnosis not present

## 2014-12-07 DIAGNOSIS — M353 Polymyalgia rheumatica: Secondary | ICD-10-CM | POA: Diagnosis not present

## 2014-12-07 DIAGNOSIS — I10 Essential (primary) hypertension: Secondary | ICD-10-CM | POA: Diagnosis not present

## 2014-12-07 DIAGNOSIS — G40901 Epilepsy, unspecified, not intractable, with status epilepticus: Secondary | ICD-10-CM | POA: Diagnosis not present

## 2014-12-07 DIAGNOSIS — D72823 Leukemoid reaction: Secondary | ICD-10-CM | POA: Insufficient documentation

## 2014-12-07 DIAGNOSIS — G40119 Localization-related (focal) (partial) symptomatic epilepsy and epileptic syndromes with simple partial seizures, intractable, without status epilepticus: Secondary | ICD-10-CM | POA: Diagnosis not present

## 2014-12-09 ENCOUNTER — Ambulatory Visit: Payer: Medicare Other | Admitting: Internal Medicine

## 2014-12-17 ENCOUNTER — Other Ambulatory Visit: Payer: Self-pay | Admitting: Internal Medicine

## 2014-12-23 ENCOUNTER — Ambulatory Visit (INDEPENDENT_AMBULATORY_CARE_PROVIDER_SITE_OTHER): Payer: Medicare Other | Admitting: Internal Medicine

## 2014-12-23 ENCOUNTER — Encounter: Payer: Self-pay | Admitting: Internal Medicine

## 2014-12-23 VITALS — BP 110/62 | HR 62 | Temp 97.9°F | Resp 18 | Wt 252.0 lb

## 2014-12-23 DIAGNOSIS — G40909 Epilepsy, unspecified, not intractable, without status epilepticus: Secondary | ICD-10-CM | POA: Diagnosis not present

## 2014-12-23 DIAGNOSIS — J309 Allergic rhinitis, unspecified: Secondary | ICD-10-CM | POA: Diagnosis not present

## 2014-12-23 DIAGNOSIS — D72823 Leukemoid reaction: Secondary | ICD-10-CM

## 2014-12-23 DIAGNOSIS — I10 Essential (primary) hypertension: Secondary | ICD-10-CM | POA: Diagnosis not present

## 2014-12-23 NOTE — Progress Notes (Signed)
Pre visit review using our clinic review tool, if applicable. No additional management support is needed unless otherwise documented below in the visit note. 

## 2014-12-23 NOTE — Patient Instructions (Addendum)
Please decrease the citalopram to every other day for a week and then discontinue it totally. Assess whether you feel it has been of benefit based on status off the medicine.  Plain Mucinex (NOT D) for thick secretions ;force NON dairy fluids .   Nasal cleansing in the shower as discussed with lather of mild shampoo.After 10 seconds wash off lather while  exhaling through nostrils. Make sure that all residual soap is removed to prevent irritation.  Fluticasone 1 spray in each nostril twice a day as needed. Use the "crossover" technique into opposite nostril spraying toward opposite ear @ 45 degree angle, not straight up into nostril.   Use the antihistamine recommended by the Neurologist  as needed for itchy eyes & sneezing.    Minimal Blood Pressure Goal= AVERAGE < 140/90;  Ideal is an AVERAGE < 135/85. This AVERAGE should be calculated from @ least 5-7 BP readings taken @ different times of day on different days of week. You should not respond to isolated BP readings , but rather the AVERAGE for that week .Please bring your  blood pressure cuff to office visits to verify that it is reliable.It  can also be checked against the blood pressure device at the pharmacy. Finger or wrist cuffs are not dependable; an arm cuff is.

## 2014-12-23 NOTE — Progress Notes (Signed)
   Subjective:    Patient ID: Jeff Weaver, male    DOB: [birth year], 69 y.o.   MRN: [id number]  HPI He was hospitalized 6/3-12/07/14 with a breakthrough seizure at [facility]. Those records were reviewed in detail.This was attributed to a non-steroidal which he had taken after dental surgery. He has actually had such phenomena on average every 6 months.  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 There is no evidence of active infection.  It was recommended that he take Norco or Tylenol rather than NSAIDS to prevent recurrent seizures.  He was also started on low-dose Lasix to improve blood pressure control and diuresis.  His blood pressure at home is less than 341 over /? Diastolic average.  He has been walking 2 miles on a regular basis without cardiopulmonary symptoms  Because of breakthrough seizures he will be seen by Dr. Noberto Retort in [place] for second opinion.   Review of Systems   He has had some rhinitis symptoms recently. A specific anti histamine was recommended to him which would have no risk of exacerbating seizure disorder  Citalopram was started by the [place]. His wife feels that he is actually more grouchy on the medication. They're questioning being able to discontinue this medicine safely.     Objective:   Physical Exam Pertinent or positive findings include: Pattern alopecia is present.The  heart sounds are distant. Abdomen is massive. He has crepitus of the knees.  General appearance :adequately nourished; in no distress. BMI: 38.33 Eyes: No conjunctival inflammation or scleral icterus is present.  Oral exam:  Lips and gums are healthy appearing.There is no oropharyngeal erythema or exudate noted. Dental hygiene is good.  Heart:  Normal rate and regular rhythm. S1 and S2 normal without gallop, murmur, click, rub or other extra sounds    Lungs:Chest clear to auscultation; no wheezes, rhonchi,rales ,or rubs present.No increased  work of breathing.   Abdomen: bowel sounds normal, soft and non-tender without masses, organomegaly or hernias noted.  No guarding or rebound.   Vascular : all pulses equal ; no bruits present.  Skin:Warm & dry.  Intact without suspicious lesions or rashes ; no tenting or jaundice   Lymphatic: No lymphadenopathy is noted about the head, neck, axilla   Neuro: Strength, tone normal.       Assessment & Plan:  #1 recurrent breakthrough seizures; second opinion scheduled  #2 rhinitis symptoms see nasal hygiene  #3 mood change, unresponsive to citalopram  Plan: See orders/ recommendations

## 2014-12-25 ENCOUNTER — Other Ambulatory Visit: Payer: Self-pay | Admitting: *Deleted

## 2014-12-25 DIAGNOSIS — I10 Essential (primary) hypertension: Secondary | ICD-10-CM

## 2014-12-25 MED ORDER — NIFEDIPINE ER OSMOTIC RELEASE 30 MG PO TB24: 30.0000 mg | ORAL_TABLET | Freq: Every day | ORAL | Status: DC

## 2014-12-25 MED ORDER — LOSARTAN POTASSIUM 100 MG PO TABS: 100.0000 mg | ORAL_TABLET | Freq: Every day | ORAL | Status: DC

## 2014-12-25 MED ORDER — FUROSEMIDE 20 MG PO TABS: 20.0000 mg | ORAL_TABLET | Freq: Every day | ORAL | Status: DC

## 2014-12-25 NOTE — Telephone Encounter (Signed)
Sent email requesting lasix, Procardia, and losartan to be sent to pharmacy...Jeff Weaver

## 2014-12-29 ENCOUNTER — Other Ambulatory Visit: Payer: Self-pay

## 2015-01-01 DIAGNOSIS — R569 Unspecified convulsions: Secondary | ICD-10-CM | POA: Insufficient documentation

## 2015-01-01 DIAGNOSIS — G4730 Sleep apnea, unspecified: Secondary | ICD-10-CM | POA: Diagnosis not present

## 2015-01-01 DIAGNOSIS — I671 Cerebral aneurysm, nonruptured: Secondary | ICD-10-CM | POA: Diagnosis not present

## 2015-01-01 DIAGNOSIS — I10 Essential (primary) hypertension: Secondary | ICD-10-CM | POA: Diagnosis not present

## 2015-01-01 DIAGNOSIS — E785 Hyperlipidemia, unspecified: Secondary | ICD-10-CM | POA: Diagnosis not present

## 2015-01-16 DIAGNOSIS — H524 Presbyopia: Secondary | ICD-10-CM | POA: Diagnosis not present

## 2015-01-16 DIAGNOSIS — H25813 Combined forms of age-related cataract, bilateral: Secondary | ICD-10-CM | POA: Diagnosis not present

## 2015-01-16 DIAGNOSIS — H5203 Hypermetropia, bilateral: Secondary | ICD-10-CM | POA: Diagnosis not present

## 2015-01-26 DIAGNOSIS — R3915 Urgency of urination: Secondary | ICD-10-CM | POA: Diagnosis not present

## 2015-01-26 DIAGNOSIS — Z87891 Personal history of nicotine dependence: Secondary | ICD-10-CM | POA: Diagnosis not present

## 2015-01-26 DIAGNOSIS — N401 Enlarged prostate with lower urinary tract symptoms: Secondary | ICD-10-CM | POA: Diagnosis not present

## 2015-01-27 ENCOUNTER — Encounter: Payer: Self-pay | Admitting: Internal Medicine

## 2015-01-27 ENCOUNTER — Ambulatory Visit (INDEPENDENT_AMBULATORY_CARE_PROVIDER_SITE_OTHER): Payer: Medicare Other | Admitting: Internal Medicine

## 2015-01-27 VITALS — BP 116/62 | HR 64 | Ht 68.25 in | Wt 257.0 lb

## 2015-01-27 DIAGNOSIS — K8020 Calculus of gallbladder without cholecystitis without obstruction: Secondary | ICD-10-CM

## 2015-01-27 DIAGNOSIS — K219 Gastro-esophageal reflux disease without esophagitis: Secondary | ICD-10-CM

## 2015-01-27 DIAGNOSIS — K5010 Crohn's disease of large intestine without complications: Secondary | ICD-10-CM | POA: Diagnosis not present

## 2015-01-27 NOTE — Progress Notes (Signed)
Jeff Weaver 71-Mar-1947 [id number]  Note: This dictation was prepared with Dragon digital system. Any transcriptional errors that result from this procedure are unintentional.   History of Present Illness: This is a 69 year old white male with  cholelithiasis on abdominal ultrasound and decreased ejection fraction to 21%.on HIDA scan. Upper endoscopy shows 2 cm hiatal hernia .with non obstructive fibrous stricture. He has complaints of bloating and gas but denies any attacks of abdominal pain, therefore I am not convinced that he has a symptomatic cholelithiasis. He was followed at [facility] till 2009, we  have been seeing him since September 2015 for history of Crohn's colitis. Last colonoscopy in October 2014 was essentially normal except for diverticulosis. Random biopsies were negative for inflammatory bowel disease. 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 He has a VP shunt. Upper abdominal ultrasound in September 2015 confirmed fatty liver, 5.1 mm common bile duct. . Multiple gallstones and slight splenomegaly. He has been overweight. He has been on prednisone 7.5 mg for polymyalgia rheumatica    Past Medical History  Diagnosis Date  . Hyperlipidemia   . HTN (hypertension)   . Enteritis (regional)     Dr Jeff Weaver  . Rheumatoid arthritis(714.0)     dxed in [employer] in 1980s  . Sleep apnea   . Cerebral aneurysm   . CVA (cerebral infarction)   . Seizures   . Diverticulosis   . GI bleed   . IBS (irritable bowel syndrome)   . Regional enteritis of large intestine since 1978    Past Surgical History  Procedure Laterality Date  . Nose surgery    . Colonoscopy w/ polypectomy  1978    negative 2009, Dr Jeff Weaver. Due 2014  . Shoulder surgery  1997  . Hernia repair    . Tonsillectomy and adenoidectomy    . Craniotomy  02/02/12    Dr Jeff Weaver, [place] of aneurysm  . Cns shunt  02/23/12  . Craniotomy  02-02-12    left  pterional craniotomy for clipping complex anterior communicating artery aneurysm   . Ventriculoperitoneal shunt  02-23-12    INSERTION OF RIGHT FRONTAL VENTRICULOPERITONEAL SHUNT WITH A CODMAN HAKIM PROGRAMMABLE VALVE  . Left heart catheterization with coronary angiogram N/A 11/26/2013    Procedure: LEFT HEART CATHETERIZATION WITH CORONARY ANGIOGRAM;  Surgeon: Jeff Man, MD;  Location: [facility] CATH LAB;  Service: Cardiovascular;  Laterality: N/A;    Allergies  Allergen Reactions  . Haloperidol Other (See Comments)    Hallucinations  . Lisinopril     Cough   . Crestor [Rosuvastatin]     Leg pain mainly in knees   . Pravastatin     Rxed by [facility] 2009; "myositis" Ok to take per pt's wife/Dr. Linna Weaver  . Simvastatin     2007: nausea & vomiting ; [facility] ( he does not remember such)    Family history and social history have been reviewed.  Review of Systems: Occasional constipation. Denies abdominal pain. Positive for bloating and gas  The remainder of the 10 point ROS is negative except as outlined in the H&P  Physical Exam: General Appearance Well developed, in no distress, obese Eyes  Non icteric  HEENT  Non traumatic, normocephalic  Mouth No lesion, tongue papillated, no cheilosis Neck Supple without adenopathy, thyroid not enlarged, no carotid bruits, no JVD Lungs Clear to auscultation bilaterally COR Normal S1, normal S2, regular rhythm, no murmur, quiet precordium Abdomen obese soft nontender  with minimal discomfort in the left lower quadrant. Liver edge at costal margin. No ascites Rectal not done Extremities  1+ pedal edema Skin No lesions Neurological Alert and oriented x 3 Psychological Normal mood and affect  Assessment and Plan:   69 year old white male with the cholelithiasis and abnormal HIDA scan. No typical gallbladder symptoms. We again discussed cholecystectomy. He is at high risk for general anesthesia because of his obesity. He also has a VP shunt which may make  cholecystectomy risky. And he has poly myalgia rheumatica,he is steroid-dependent. . I have instructed him in a low-fat diet and again emphasized importance of aweight loss. We decided against surgical referral because we will ask Dr. Linna Weaver first as to feasibility of tapering or at least reducing steroids  Crohn's disease. Normal colonoscopy 2 years ago. Mild constipation. May take probiotics or stool softer   Jeff Weaver 01/27/2015

## 2015-01-27 NOTE — Patient Instructions (Addendum)
Continue ranitidine and low fat diet.   Dr Newt Lukes

## 2015-01-28 DIAGNOSIS — B351 Tinea unguium: Secondary | ICD-10-CM | POA: Diagnosis not present

## 2015-01-28 DIAGNOSIS — L03032 Cellulitis of left toe: Secondary | ICD-10-CM | POA: Diagnosis not present

## 2015-02-08 ENCOUNTER — Other Ambulatory Visit: Payer: Self-pay | Admitting: Internal Medicine

## 2015-02-09 ENCOUNTER — Other Ambulatory Visit: Payer: Self-pay | Admitting: Internal Medicine

## 2015-02-10 NOTE — Telephone Encounter (Signed)
This should be continued once okayed by Dr. Olevia Perches. She wanted to hold this until she completed her workup.  The sedimentation rate needs to be repeated every 6-8 weeks.

## 2015-02-10 NOTE — Telephone Encounter (Signed)
Does pt need to continue with predinsone? Last refill 05/16, last ov 06/16

## 2015-02-13 ENCOUNTER — Ambulatory Visit (INDEPENDENT_AMBULATORY_CARE_PROVIDER_SITE_OTHER): Payer: Medicare Other | Admitting: Internal Medicine

## 2015-02-13 ENCOUNTER — Other Ambulatory Visit (INDEPENDENT_AMBULATORY_CARE_PROVIDER_SITE_OTHER): Payer: Medicare Other

## 2015-02-13 ENCOUNTER — Encounter: Payer: Self-pay | Admitting: Internal Medicine

## 2015-02-13 VITALS — BP 130/70 | HR 58 | Temp 98.4°F | Resp 18 | Wt 257.0 lb

## 2015-02-13 DIAGNOSIS — M791 Myalgia: Secondary | ICD-10-CM | POA: Diagnosis not present

## 2015-02-13 DIAGNOSIS — IMO0001 Reserved for inherently not codable concepts without codable children: Secondary | ICD-10-CM

## 2015-02-13 DIAGNOSIS — M353 Polymyalgia rheumatica: Secondary | ICD-10-CM

## 2015-02-13 DIAGNOSIS — M609 Myositis, unspecified: Secondary | ICD-10-CM | POA: Diagnosis not present

## 2015-02-13 LAB — CK: Total CK: 82 U/L (ref 7–232)

## 2015-02-13 LAB — SEDIMENTATION RATE: Sed Rate: 12 mm/hr (ref 0–22)

## 2015-02-13 NOTE — Patient Instructions (Signed)
If the sedimentation rate remains low; prednisone will be decreased by 2.5 mg every 3 months and then discontinued.

## 2015-02-13 NOTE — Progress Notes (Signed)
   Subjective:    Patient ID: Jeff Weaver, male    DOB: Aug 15, 1945, 69 y.o.   MRN: [id number]  HPI Dr Nichola Sizer 01/27/15 office visit was reviewed. She is deferring general surgery referral at this time for elective cholecystectomy. She's placed him on a low-fat diet. She also would like to see oral steroid dose reduced if possible prior to any surgery.  In November 2015 he was diagnosed with polymyalgia rheumatica. At that time sedimentation rate was 91. It has progressively dropped and most recently was 11 on 11/13/14. He is on 7.5 mg prednisone at this time. He denies neck or shoulder girdle pain or hip girdle pain @ this time. He does describe tenderness to deep palpation in the thighs and muscles of the upper extremities.  Review of Systems Unexplained weight loss, abdominal pain, significant dyspepsia, dysphagia, melena, rectal bleeding, or persistently small caliber stools are denied.    Objective:   Physical Exam  Pertinent or positive findings include: abdomen is massive. He has some tenderness to percussion over the right upper quadrant.   General appearance :adequately nourished; in no distress. BMI 38.77  Eyes: No conjunctival inflammation or scleral icterus is present.  Oral exam:  Lips and gums are healthy appearing.There is no oropharyngeal erythema or exudate noted. Dental hygiene is good.  Heart:  Normal rate and regular rhythm. S1 and S2 normal without gallop, murmur, click, rub or other extra sounds    Lungs:Chest clear to auscultation; no wheezes, rhonchi,rales ,or rubs present.No increased work of breathing.   Abdomen: bowel sounds normal,  without masses or organomegaly .  No guarding or rebound. .  Vascular : all pulses equal ; no bruits present.  Skin:Warm & dry.  Intact without suspicious lesions or rashes ; no tenting or jaundice   Lymphatic: No lymphadenopathy is noted about the head, neck, axilla   Neuro: Strength, tone normal.His history is somewhat  suspect.For instance he states he walks 2 mpd. His BMI and physical state belie that statement.        Assessment & Plan:  #1 diagnosis of polymyalgia rheumatica; @ this time he does not have active symptoms of such.  #2 gallstones, asymptomatic  #3 extremity muscular pain  Plan: If sedimentation rate is low; the prednisone will be decreased by 2.5 mg every 3 months and then discontinued if the sedimentation rate  remains low and he asymptomatic.

## 2015-02-13 NOTE — Progress Notes (Signed)
Pre visit review using our clinic review tool, if applicable. No additional management support is needed unless otherwise documented below in the visit note. 

## 2015-02-14 ENCOUNTER — Other Ambulatory Visit: Payer: Self-pay | Admitting: Internal Medicine

## 2015-02-14 DIAGNOSIS — R7303 Prediabetes: Secondary | ICD-10-CM

## 2015-02-14 DIAGNOSIS — M353 Polymyalgia rheumatica: Secondary | ICD-10-CM

## 2015-02-14 MED ORDER — PREDNISONE 5 MG PO TABS: 5.0000 mg | ORAL_TABLET | Freq: Every day | ORAL | Status: DC

## 2015-02-23 DIAGNOSIS — R3915 Urgency of urination: Secondary | ICD-10-CM | POA: Diagnosis not present

## 2015-02-27 DIAGNOSIS — L03032 Cellulitis of left toe: Secondary | ICD-10-CM | POA: Diagnosis not present

## 2015-03-23 ENCOUNTER — Other Ambulatory Visit: Payer: Self-pay | Admitting: Internal Medicine

## 2015-04-01 DIAGNOSIS — B351 Tinea unguium: Secondary | ICD-10-CM | POA: Diagnosis not present

## 2015-04-01 DIAGNOSIS — L259 Unspecified contact dermatitis, unspecified cause: Secondary | ICD-10-CM | POA: Diagnosis not present

## 2015-04-07 ENCOUNTER — Telehealth: Payer: Self-pay

## 2015-04-07 NOTE — Telephone Encounter (Signed)
Left message to return call. Pt. needs a follow up with one of the new doctors to continue getting refills on Zantac. Last office visit with Dr D.Brodie on 01-27-2015.

## 2015-04-09 NOTE — Telephone Encounter (Signed)
Ok  If Grant did not set ROV interval, I would rec 6 months from last appt

## 2015-04-09 NOTE — Telephone Encounter (Signed)
This is a former Dr D. Brodie patient who is requesting you to take over his care. Pt. declined appointments with Dr Silverio Decamp and Dr Havery Moros. Please review chart and advise if you are willing to accept this pt. Thank you.

## 2015-04-15 MED ORDER — RANITIDINE HCL 150 MG PO CAPS: 150.0000 mg | ORAL_CAPSULE | Freq: Every morning | ORAL | Status: DC

## 2015-04-15 NOTE — Telephone Encounter (Signed)
Pts wife was notified and aware of RX approval for Zantac. Pt is wanting to have [facility] per request.bShe will also call and schedule a follow up after the first of the year.

## 2015-04-23 DIAGNOSIS — I671 Cerebral aneurysm, nonruptured: Secondary | ICD-10-CM | POA: Diagnosis not present

## 2015-04-24 ENCOUNTER — Telehealth: Payer: Self-pay | Admitting: Emergency Medicine

## 2015-04-24 ENCOUNTER — Other Ambulatory Visit (INDEPENDENT_AMBULATORY_CARE_PROVIDER_SITE_OTHER): Payer: Medicare Other

## 2015-04-24 DIAGNOSIS — R7303 Prediabetes: Secondary | ICD-10-CM | POA: Diagnosis not present

## 2015-04-24 DIAGNOSIS — M353 Polymyalgia rheumatica: Secondary | ICD-10-CM | POA: Diagnosis not present

## 2015-04-24 LAB — SEDIMENTATION RATE: Sed Rate: 10 mm/hr (ref 0–22)

## 2015-04-24 LAB — HEMOGLOBIN A1C: Hgb A1c MFr Bld: 5.9 % (ref 4.6–6.5)

## 2015-04-26 DIAGNOSIS — Z8673 Personal history of transient ischemic attack (TIA), and cerebral infarction without residual deficits: Secondary | ICD-10-CM | POA: Diagnosis not present

## 2015-04-26 DIAGNOSIS — I671 Cerebral aneurysm, nonruptured: Secondary | ICD-10-CM | POA: Diagnosis not present

## 2015-04-26 DIAGNOSIS — M353 Polymyalgia rheumatica: Secondary | ICD-10-CM | POA: Diagnosis not present

## 2015-04-26 DIAGNOSIS — Z982 Presence of cerebrospinal fluid drainage device: Secondary | ICD-10-CM | POA: Diagnosis not present

## 2015-04-26 DIAGNOSIS — D72823 Leukemoid reaction: Secondary | ICD-10-CM | POA: Diagnosis not present

## 2015-04-26 DIAGNOSIS — I10 Essential (primary) hypertension: Secondary | ICD-10-CM | POA: Diagnosis not present

## 2015-04-26 DIAGNOSIS — R569 Unspecified convulsions: Secondary | ICD-10-CM | POA: Diagnosis not present

## 2015-04-27 DIAGNOSIS — Z8673 Personal history of transient ischemic attack (TIA), and cerebral infarction without residual deficits: Secondary | ICD-10-CM | POA: Diagnosis not present

## 2015-04-27 DIAGNOSIS — Z8249 Family history of ischemic heart disease and other diseases of the circulatory system: Secondary | ICD-10-CM | POA: Diagnosis not present

## 2015-04-27 DIAGNOSIS — I781 Nevus, non-neoplastic: Secondary | ICD-10-CM | POA: Diagnosis present

## 2015-04-27 DIAGNOSIS — Z888 Allergy status to other drugs, medicaments and biological substances status: Secondary | ICD-10-CM | POA: Diagnosis not present

## 2015-04-27 DIAGNOSIS — L309 Dermatitis, unspecified: Secondary | ICD-10-CM | POA: Diagnosis present

## 2015-04-27 DIAGNOSIS — E785 Hyperlipidemia, unspecified: Secondary | ICD-10-CM | POA: Diagnosis present

## 2015-04-27 DIAGNOSIS — Z982 Presence of cerebrospinal fluid drainage device: Secondary | ICD-10-CM | POA: Diagnosis not present

## 2015-04-27 DIAGNOSIS — I10 Essential (primary) hypertension: Secondary | ICD-10-CM | POA: Diagnosis present

## 2015-04-27 DIAGNOSIS — M353 Polymyalgia rheumatica: Secondary | ICD-10-CM | POA: Diagnosis present

## 2015-04-27 DIAGNOSIS — R569 Unspecified convulsions: Secondary | ICD-10-CM | POA: Diagnosis not present

## 2015-04-27 DIAGNOSIS — Z79899 Other long term (current) drug therapy: Secondary | ICD-10-CM | POA: Diagnosis not present

## 2015-04-27 DIAGNOSIS — L570 Actinic keratosis: Secondary | ICD-10-CM | POA: Diagnosis not present

## 2015-04-27 DIAGNOSIS — L821 Other seborrheic keratosis: Secondary | ICD-10-CM | POA: Diagnosis not present

## 2015-04-27 DIAGNOSIS — Z87891 Personal history of nicotine dependence: Secondary | ICD-10-CM | POA: Diagnosis not present

## 2015-04-27 DIAGNOSIS — G4733 Obstructive sleep apnea (adult) (pediatric): Secondary | ICD-10-CM | POA: Diagnosis present

## 2015-04-27 DIAGNOSIS — D1801 Hemangioma of skin and subcutaneous tissue: Secondary | ICD-10-CM | POA: Diagnosis not present

## 2015-04-27 DIAGNOSIS — L308 Other specified dermatitis: Secondary | ICD-10-CM | POA: Diagnosis not present

## 2015-04-27 DIAGNOSIS — Z7289 Other problems related to lifestyle: Secondary | ICD-10-CM | POA: Diagnosis not present

## 2015-04-27 DIAGNOSIS — I671 Cerebral aneurysm, nonruptured: Secondary | ICD-10-CM | POA: Diagnosis not present

## 2015-05-01 ENCOUNTER — Encounter: Payer: Self-pay | Admitting: Internal Medicine

## 2015-05-01 ENCOUNTER — Ambulatory Visit (INDEPENDENT_AMBULATORY_CARE_PROVIDER_SITE_OTHER): Payer: Medicare Other | Admitting: Internal Medicine

## 2015-05-01 VITALS — BP 124/68 | HR 55 | Temp 98.2°F | Ht 68.25 in | Wt 259.5 lb

## 2015-05-01 DIAGNOSIS — M353 Polymyalgia rheumatica: Secondary | ICD-10-CM | POA: Diagnosis not present

## 2015-05-01 DIAGNOSIS — Z23 Encounter for immunization: Secondary | ICD-10-CM | POA: Diagnosis not present

## 2015-05-01 DIAGNOSIS — K8020 Calculus of gallbladder without cholecystitis without obstruction: Secondary | ICD-10-CM

## 2015-05-01 DIAGNOSIS — G40909 Epilepsy, unspecified, not intractable, without status epilepticus: Secondary | ICD-10-CM | POA: Diagnosis not present

## 2015-05-01 DIAGNOSIS — G4733 Obstructive sleep apnea (adult) (pediatric): Secondary | ICD-10-CM | POA: Diagnosis not present

## 2015-05-01 NOTE — Progress Notes (Signed)
   Subjective:    Patient ID: Jeff Weaver, male    DOB: 1946/05/29, 69 y.o.   MRN: [id number]  HPI He was weaned off Century as of 04/26/15 because of excess somnolence with this agent. He had 2 seizures 10/23 for which he was admitted to [facility]. A combination of Vimpat, Keppra, Aptiom, and Briviact were prescribed.He's had no seizures since.  Extensive labs were negative except for mildly elevated glucose of 107. His most recent A1c was 5.9%.  Dr Forde Radon had recommended weaning the prednisone if possible as a prelude to possible cholecystectomy. She has retired. I recommended that he see Dr. Ardis Hughs to assess the gallbladder status.  With weaning of the prednisone he has not had a flare of any polymyalgia rheumatica symptoms.  He continues to be compliant with his CPAP. He does have occasional paroxysmal nocturnal dyspnea. Occasionally he will have some difficulty swallowing after he takes the CPAP mask off in the morning.  He also is being evaluated for possible cataract surgery.  Review of Systems  There is no significant cough, sputum production,hemoptysis,or wheezing. Unexplained weight loss, abdominal pain, significant dyspepsia, melena, rectal bleeding, or persistently small caliber stools are not present. Dysuria, pyuria, hematuria, frequency, nocturia or polyuria are denied.    Objective:   Physical Exam Pertinent or positive findings include: Pattern alopecia is present. There is ptosis bilaterally. There is nystagmus with lateral gaze bilaterally. Heart sounds are distant. Heart rate is slow. Breath sounds are decreased also. Abdomen is massive. Crepitus is noted in the knees. He has trace edema as one half plus edema at the sock line. Knee reflexes are 1/2+. He has a slight resting tremor of the hands.  General appearance :adequately nourished; in no distress.  Eyes: No conjunctival inflammation or scleral icterus is present.  Oral exam:  Lips and gums are healthy  appearing.There is no oropharyngeal erythema or exudate noted.   Heart:  Normal rate and regular rhythm. S1 and S2 normal without gallop, murmur, click, rub or other extra sounds    Lungs:Chest clear to auscultation; no wheezes, rhonchi,rales ,or rubs present.No increased work of breathing.   Abdomen: bowel sounds normal, soft and non-tender without masses, organomegaly or hernias noted.  No guarding or rebound.   Vascular : all pulses equal ; no bruits present.  Skin:Warm & dry.  Intact without suspicious lesions or rashes ; no tenting or jaundice   Lymphatic: No lymphadenopathy is noted about the head, neck, axilla.   Neuro: Strength, tone  normal.     Assessment & Plan:  #1 working diagnosis of polymyalgia rheumatica. If indeed he has polymyalgia rheumatica; this is an atypical clinical picture despite the initial sedimentation rate of 91. His sedimentation rate has remained extremely low and he has not had significant shoulder or hip girdle symptoms.  #2 seizure disorder; as per [facility]  #3 cholelithiasis ; ? Significance clinically  Plan: See after visit summary

## 2015-05-01 NOTE — Progress Notes (Signed)
Pre visit review using our clinic review tool, if applicable. No additional management support is needed unless otherwise documented below in the visit note. 

## 2015-05-01 NOTE — Patient Instructions (Signed)
Please decrease the prednisone to 2.5 mg daily for one month. Thereafter decrease to 2.5 mg every other day for one month. Sedimentation rate should be done at that time. If sedimentation rate remains normal and no shoulder or hip arthralgias of significance are present; prednisone should be discontinued.

## 2015-05-02 DIAGNOSIS — K8020 Calculus of gallbladder without cholecystitis without obstruction: Secondary | ICD-10-CM | POA: Insufficient documentation

## 2015-05-04 DIAGNOSIS — G9389 Other specified disorders of brain: Secondary | ICD-10-CM | POA: Diagnosis not present

## 2015-05-04 DIAGNOSIS — G919 Hydrocephalus, unspecified: Secondary | ICD-10-CM | POA: Diagnosis not present

## 2015-05-04 DIAGNOSIS — I671 Cerebral aneurysm, nonruptured: Secondary | ICD-10-CM | POA: Diagnosis not present

## 2015-05-04 DIAGNOSIS — Z982 Presence of cerebrospinal fluid drainage device: Secondary | ICD-10-CM | POA: Diagnosis not present

## 2015-05-08 ENCOUNTER — Encounter: Payer: Self-pay | Admitting: Internal Medicine

## 2015-05-08 DIAGNOSIS — G40219 Localization-related (focal) (partial) symptomatic epilepsy and epileptic syndromes with complex partial seizures, intractable, without status epilepticus: Secondary | ICD-10-CM | POA: Diagnosis not present

## 2015-05-19 DIAGNOSIS — B351 Tinea unguium: Secondary | ICD-10-CM | POA: Diagnosis not present

## 2015-05-19 DIAGNOSIS — M67472 Ganglion, left ankle and foot: Secondary | ICD-10-CM | POA: Diagnosis not present

## 2015-05-20 DIAGNOSIS — G4733 Obstructive sleep apnea (adult) (pediatric): Secondary | ICD-10-CM | POA: Diagnosis not present

## 2015-05-20 DIAGNOSIS — G40109 Localization-related (focal) (partial) symptomatic epilepsy and epileptic syndromes with simple partial seizures, not intractable, without status epilepticus: Secondary | ICD-10-CM | POA: Diagnosis not present

## 2015-06-11 ENCOUNTER — Other Ambulatory Visit: Payer: Self-pay

## 2015-06-11 DIAGNOSIS — M353 Polymyalgia rheumatica: Secondary | ICD-10-CM

## 2015-06-11 MED ORDER — PREDNISONE 5 MG PO TABS: ORAL_TABLET | ORAL | Status: DC

## 2015-06-12 DIAGNOSIS — Z9989 Dependence on other enabling machines and devices: Secondary | ICD-10-CM | POA: Diagnosis not present

## 2015-06-12 DIAGNOSIS — G40109 Localization-related (focal) (partial) symptomatic epilepsy and epileptic syndromes with simple partial seizures, not intractable, without status epilepticus: Secondary | ICD-10-CM | POA: Diagnosis not present

## 2015-06-12 DIAGNOSIS — G4733 Obstructive sleep apnea (adult) (pediatric): Secondary | ICD-10-CM | POA: Diagnosis not present

## 2015-06-17 DIAGNOSIS — G40109 Localization-related (focal) (partial) symptomatic epilepsy and epileptic syndromes with simple partial seizures, not intractable, without status epilepticus: Secondary | ICD-10-CM | POA: Diagnosis not present

## 2015-06-22 ENCOUNTER — Encounter: Payer: Self-pay | Admitting: Internal Medicine

## 2015-06-22 ENCOUNTER — Ambulatory Visit (INDEPENDENT_AMBULATORY_CARE_PROVIDER_SITE_OTHER): Payer: Medicare Other | Admitting: Internal Medicine

## 2015-06-22 VITALS — BP 110/62 | HR 56 | Temp 98.7°F | Resp 20 | Ht 68.0 in | Wt 259.8 lb

## 2015-06-22 DIAGNOSIS — I10 Essential (primary) hypertension: Secondary | ICD-10-CM

## 2015-06-22 DIAGNOSIS — G4733 Obstructive sleep apnea (adult) (pediatric): Secondary | ICD-10-CM | POA: Diagnosis not present

## 2015-06-22 DIAGNOSIS — G40909 Epilepsy, unspecified, not intractable, without status epilepticus: Secondary | ICD-10-CM | POA: Diagnosis not present

## 2015-06-22 NOTE — Patient Instructions (Signed)
Decrease losartan 100 mg to one half pill daily.  Blood pressure goal is 120-135/70-85. His blood pressure is lower than this range after 7-10 days; stop the losartan completely and continue blood pressure monitor.

## 2015-06-22 NOTE — Progress Notes (Signed)
   Subjective:    Patient ID: Jeff Weaver, male    DOB: 1945/07/29, 69 y.o.   MRN: [id number]  HPI Dr Inocente Salles, Neurologist @ [facility] has decreased his Vimpat and plans further weaning. There was no change in the Aption and Keppra.  She performed a new sleep study and has prescribed a new CPAP machine and settings.  She is concerned about relative hypotension and wants his blood pressure medications reduced to improve CNS circulation  Additional symptoms include fatigue and excess sleeping. He also has some anhedonia. Confusion is an issue. He also has some trouble with word retrieval.  He has not initiated the new CPAP equipment.  At present he remains on the Procardia XL 30 and losartan 100 mg. He is also on furosemide 20 mg daily.  He does describe some edema. He also has some exertional dyspnea.  He has minor epistaxis but no other bleeding dyscrasias.  Review of Systems  Chest pain, palpitations, tachycardia,  paroxysmal nocturnal dyspnea, claudication or edema are absent.  Hemoptysis, hematuria, melena, or rectal bleeding denied. No unexplained weight loss, significant dyspepsia,dysphagia, or abdominal pain.  There is no abnormal bruising , bleeding, or difficulty stopping bleeding with injury.    Objective:   Physical Exam  Pertinent or positive findings include: Heart sounds are distant. Abdomen is massive. Ventral hernia is noted. He has trace pedal edema.  General appearance :adequately nourished; in no distress.  Eyes: No conjunctival inflammation or scleral icterus is present.  Heart:  Normal rate and regular rhythm. S1 and S2 normal without gallop, murmur, click, rub or other extra sounds    Lungs:Chest clear to auscultation; no wheezes, rhonchi,rales ,or rubs present.No increased work of breathing.   Abdomen: bowel sounds normal, soft and non-tender without masses, organomegaly or hernias noted.  No guarding or rebound.   Vascular : all pulses equal ; no bruits  present.  Skin:Warm & dry.  Intact without suspicious lesions or rashes ; no tenting or jaundice   Lymphatic: No lymphadenopathy is noted about the head, neck, axilla.   Neuro: Strength, tone decreased     Assessment & Plan:  See Current Assessment & Plan in Problem List under specific Diagnosis

## 2015-06-22 NOTE — Assessment & Plan Note (Signed)
Assess response of excess somnolence, fatigue, confusion, and anhedonia to new CPAP equipment settings

## 2015-06-22 NOTE — Progress Notes (Signed)
Pre visit review using our clinic review tool, if applicable. No additional management support is needed unless otherwise documented below in the visit note. 

## 2015-06-22 NOTE — Assessment & Plan Note (Signed)
BP goal = 120-135/70-85 Losartan 1/2 qd

## 2015-06-24 ENCOUNTER — Telehealth: Payer: Self-pay | Admitting: Internal Medicine

## 2015-06-24 DIAGNOSIS — I10 Essential (primary) hypertension: Secondary | ICD-10-CM

## 2015-06-24 MED ORDER — NIFEDIPINE ER OSMOTIC RELEASE 30 MG PO TB24: 30.0000 mg | ORAL_TABLET | Freq: Every day | ORAL | Status: DC

## 2015-06-24 MED ORDER — FUROSEMIDE 20 MG PO TABS: 20.0000 mg | ORAL_TABLET | Freq: Every day | ORAL | Status: DC

## 2015-06-24 NOTE — Telephone Encounter (Signed)
pts wife called to request refills for NIFEdipine (PROCARDIA-XL/ADALAT-CC/NIFEDICAL-XL) 30 MG 24 hr tablet [[id number] and furosemide (LASIX) 20 MG tablet [[id number]  [facility] [address]  He will not have enough till his next appt with Dr. Quay Burow

## 2015-06-24 NOTE — Telephone Encounter (Signed)
Notified pt/wife refills sent to med center...Jeff Weaver

## 2015-06-30 ENCOUNTER — Telehealth: Payer: Self-pay | Admitting: Internal Medicine

## 2015-06-30 ENCOUNTER — Ambulatory Visit: Payer: Medicare Other | Admitting: Internal Medicine

## 2015-06-30 NOTE — Telephone Encounter (Signed)
Noted  

## 2015-07-03 ENCOUNTER — Other Ambulatory Visit (INDEPENDENT_AMBULATORY_CARE_PROVIDER_SITE_OTHER): Payer: Medicare Other

## 2015-07-03 DIAGNOSIS — M353 Polymyalgia rheumatica: Secondary | ICD-10-CM | POA: Diagnosis not present

## 2015-07-03 LAB — SEDIMENTATION RATE: Sed Rate: 5 mm/hr (ref 0–22)

## 2015-07-07 ENCOUNTER — Encounter: Payer: Self-pay | Admitting: Internal Medicine

## 2015-07-07 ENCOUNTER — Ambulatory Visit (INDEPENDENT_AMBULATORY_CARE_PROVIDER_SITE_OTHER): Payer: Medicare Other | Admitting: Internal Medicine

## 2015-07-07 ENCOUNTER — Other Ambulatory Visit (INDEPENDENT_AMBULATORY_CARE_PROVIDER_SITE_OTHER): Payer: Medicare Other

## 2015-07-07 VITALS — BP 122/74 | HR 56 | Temp 97.9°F | Resp 18 | Wt 255.0 lb

## 2015-07-07 DIAGNOSIS — G40909 Epilepsy, unspecified, not intractable, without status epilepticus: Secondary | ICD-10-CM | POA: Diagnosis not present

## 2015-07-07 DIAGNOSIS — R748 Abnormal levels of other serum enzymes: Secondary | ICD-10-CM

## 2015-07-07 DIAGNOSIS — I10 Essential (primary) hypertension: Secondary | ICD-10-CM

## 2015-07-07 DIAGNOSIS — R7303 Prediabetes: Secondary | ICD-10-CM

## 2015-07-07 DIAGNOSIS — R739 Hyperglycemia, unspecified: Secondary | ICD-10-CM

## 2015-07-07 LAB — COMPREHENSIVE METABOLIC PANEL
ALT: 38 U/L (ref 0–53)
AST: 22 U/L (ref 0–37)
Albumin: 4.3 g/dL (ref 3.5–5.2)
Alkaline Phosphatase: 47 U/L (ref 39–117)
BUN: 14 mg/dL (ref 6–23)
CO2: 32 mEq/L (ref 19–32)
Calcium: 9.9 mg/dL (ref 8.4–10.5)
Chloride: 99 mEq/L (ref 96–112)
Creatinine, Ser: 1 mg/dL (ref 0.40–1.50)
GFR: 78.26 mL/min (ref >60.00)
Glucose, Bld: 107 mg/dL — ABNORMAL HIGH (ref 70–99)
Potassium: 4.3 mEq/L (ref 3.5–5.1)
Sodium: 139 mEq/L (ref 135–145)
Total Bilirubin: 0.4 mg/dL (ref 0.2–1.2)
Total Protein: 7.1 g/dL (ref 6.0–8.3)

## 2015-07-07 LAB — CBC WITH DIFFERENTIAL/PLATELET
Basophils Absolute: 0 10*3/uL (ref 0.0–0.1)
Basophils Relative: 0.3 % (ref 0.0–3.0)
Eosinophils Absolute: 0.3 10*3/uL (ref 0.0–0.7)
Eosinophils Relative: 3.1 % (ref 0.0–5.0)
HCT: 45.8 % (ref 39.0–52.0)
Hemoglobin: 15.2 g/dL (ref 13.0–17.0)
Lymphocytes Relative: 15.9 % (ref 12.0–46.0)
Lymphs Abs: 1.3 10*3/uL (ref 0.7–4.0)
MCHC: 33.1 g/dL (ref 30.0–36.0)
MCV: 92.4 fl (ref 78.0–100.0)
Monocytes Absolute: 0.6 10*3/uL (ref 0.1–1.0)
Monocytes Relative: 6.9 % (ref 3.0–12.0)
Neutro Abs: 6.3 10*3/uL (ref 1.4–7.7)
Neutrophils Relative %: 73.8 % (ref 43.0–77.0)
Platelets: 213 10*3/uL (ref 150.0–400.0)
RBC: 4.96 Mil/uL (ref 4.22–5.81)
RDW: 13.6 % (ref 11.5–15.5)
WBC: 8.5 10*3/uL (ref 4.0–10.5)

## 2015-07-07 LAB — LIPID PANEL
Cholesterol: 203 mg/dL — ABNORMAL HIGH (ref 0–200)
HDL: 44 mg/dL (ref >39.00)
LDL Cholesterol: 136 mg/dL — ABNORMAL HIGH (ref 0–99)
NonHDL: 158.6
Total CHOL/HDL Ratio: 5
Triglycerides: 111 mg/dL (ref 0.0–149.0)
VLDL: 22.2 mg/dL (ref 0.0–40.0)

## 2015-07-07 LAB — TSH: TSH: 1.68 u[IU]/mL (ref 0.35–4.50)

## 2015-07-07 LAB — HEMOGLOBIN A1C: Hgb A1c MFr Bld: 6 % (ref 4.6–6.5)

## 2015-07-07 MED ORDER — LOSARTAN POTASSIUM 50 MG PO TABS: 50.0000 mg | ORAL_TABLET | Freq: Every day | ORAL | Status: DC

## 2015-07-07 NOTE — Assessment & Plan Note (Signed)
Likely related to fatty liver Recheck LFTs prior to next visit

## 2015-07-07 NOTE — Assessment & Plan Note (Signed)
Lab Results  Component Value Date   HGBA1C 6.0 07/07/2015   Need to monitor twice a year-we'll recheck at his next visit Continue regular exercise Low sugar/carbohydrate diet and recommended

## 2015-07-07 NOTE — Patient Instructions (Addendum)
  We have reviewed your prior records including labs and tests today.  Medications reviewed and .  No changes recommended at this time.   Please schedule followup in 6 months

## 2015-07-07 NOTE — Assessment & Plan Note (Signed)
Blood pressure well-controlled here today Continue current medication-we'll certainly was decreased in the last few months due to blood pressure being on the low side

## 2015-07-07 NOTE — Assessment & Plan Note (Signed)
Following with neurology On multiple medications and neurology is attempting to reduce the number of medications he is taking Her general

## 2015-07-07 NOTE — Progress Notes (Signed)
Subjective:    Patient ID: Jeff Weaver, male    DOB: 1946-01-05, 70 y.o.   MRN: [id number]  HPI  He is here to establish with a new primary care physician. He is accompanied by his wife and daughter.  Cerebral aneurysm, seizure disorder, stroke: He is following with neurology. He had surgery for a cerebral aneurysm in 2013.  Following surgery he had a stroke and bleeding in the brain. He did have a VP shunt placed. He has seizures and is currently on 3 different medications to control them. His current neurologist is working on decreasing his seizure medications. As a result of these problems he does have some difficulty expressing himself-he usually knows when he wants to say, but can either not the word horses something different. He has some memory difficulties, difficulties with numbers and processing information. He has balance issues and uses a cane to ambulate. His walking problems are intermittent.  His neurologist arranged a sleep study because he was using a CPAP machine and was very that she was concerned it is not been enough oxygen at night. He will be getting a new machine through the [place].  He has been having 2 seizures a year-his last seizure was the end of October.  Gallstones: He has multiple gallstones, but has not been 5 is a good surgical candidate. He follows with GI upstairs.  He has a large prostate and is currently taking Flomax. He also takes myrbetriq. He follows with urology at [place].  He usually walks for 20-30 minutes in evening.    Hypertension: He is taking his medication daily. His dose was decreased within the past few months because his blood pressure was on the low side. He does try to walk regularly. Is looking for his results she is currently on no thyromegaly or jugular. He is using the regular alone.  Prediabetes: He is trying to exercise regularly. He eats fairly healthy, but could do better with portions. He knows he should be working on weight  loss.  Medications and allergies reviewed with patient and updated if appropriate.  Patient Active Problem List   Diagnosis Date Noted  . Cholelithiasis 05/02/2015  . GERD (gastroesophageal reflux disease) 07/14/2014  . PMR (polymyalgia rheumatica) ([place]) 05/28/2014  . Hyponatremia 05/28/2014  . Hypokalemia 05/28/2014  . 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 Elevated liver enzymes 05/28/2014  . NSTEMI (non-ST elevated myocardial infarction) ([place]) 11/25/2013  . SBE (subacute bacterial endocarditis) prophylaxis candidate 11/24/2013  . Pre-diabetes 03/26/2013  . Epigastric discomfort 01/08/2013  . Edema 01/08/2013  . Seizure disorder ([place]) 07/23/2012  . Cerebral aneurysm 11/23/2011  . OSA (obstructive sleep apnea) 08/09/2011  . Other and unspecified hyperlipidemia 02/03/2009  . Essential hypertension 02/03/2009  . First degree atrioventricular block 02/03/2009  . OSTEOARTHRITIS 02/03/2009  . REGIONAL ENTERITIS, LARGE INTESTINE 04/17/2008    Current Outpatient Prescriptions on File Prior to Visit  Medication Sig Dispense Refill  . acetaminophen (TYLENOL) 325 MG tablet Take 650 mg by mouth every 6 (six) hours as needed for mild pain or moderate pain.    Marland Kitchen amoxicillin (AMOXIL) 500 MG capsule Take 2,000 mg by mouth. Prior to Dental work.    . fluticasone (FLONASE) 50 MCG/ACT nasal spray Place 1 spray into both nostrils 2 (two) times daily. 16 g 5  . furosemide (LASIX) 20 MG tablet Take 1 tablet (20 mg total) by mouth daily. 90 tablet 0  . Lacosamide (VIMPAT) 100 MG TABS Take 100-300 mg by mouth 2 (two)  times daily. 1 tablets in the am 3 in the PM    . levETIRAcetam (KEPPRA) 500 MG tablet Take 250 mg by mouth 2 (two) times daily.     Marland Kitchen losartan (COZAAR) 100 MG tablet Take 1/2 qd 90 tablet 1  . mirabegron ER (MYRBETRIQ) 25 MG TB24 tablet Take 25 mg by mouth.    Marland Kitchen NIFEdipine (PROCARDIA-XL/ADALAT-CC/NIFEDICAL-XL) 30 MG 24 hr tablet Take 1 tablet (30 mg total) by mouth daily. 90 tablet 0  . NON  FORMULARY CPAP Machine: authorized by [place] in [place]    . pyridoxine (B-6) 100 MG tablet Take 100 mg by mouth daily.    . ranitidine (ZANTAC) 150 MG capsule Take 1 capsule (150 mg total) by mouth every morning. 90 capsule 1  . Tamsulosin HCl (FLOMAX) 0.4 MG CAPS Take 0.8 mg by mouth at bedtime.     . thiamine 100 MG tablet Take 100 mg by mouth daily.     No current facility-administered medications on file prior to visit.    Past Medical History  Diagnosis Date  . Hyperlipidemia   . HTN (hypertension)   . Enteritis (regional)     Dr Olevia Perches  . Rheumatoid arthritis(714.0)     dxed in [employer] in 1980s  . Sleep apnea   . Cerebral aneurysm   . CVA (cerebral infarction)   . Seizures ([place])   . Diverticulosis   . GI bleed   . IBS (irritable bowel syndrome)   . 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 Nose surgery    . Colonoscopy w/ polypectomy  1978    negative 2009, Dr Olevia Perches. Due 2014  . Shoulder surgery  1997  . Hernia repair    . Tonsillectomy and adenoidectomy    . Craniotomy  02/02/12    Dr Harvel Ricks, [place] of aneurysm  . Cns shunt  02/23/12  . 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 Ventriculoperitoneal shunt  02-23-12    INSERTION OF RIGHT FRONTAL VENTRICULOPERITONEAL SHUNT WITH A CODMAN HAKIM PROGRAMMABLE VALVE  . Left heart catheterization with coronary angiogram N/A 11/26/2013    Procedure: LEFT HEART CATHETERIZATION WITH CORONARY ANGIOGRAM;  Surgeon: [name], MD;  Location: [facility] CATH LAB;  Service: Cardiovascular;  Laterality: N/A;    Social History   Social History  . Marital Status: Married    Spouse Name: N/A  . Number of Children: 1  . Years of Education: N/A   Occupational History  . retired    Social History Main Topics  . Smoking status: Former Smoker    Quit date: 07/04/2009  . Smokeless tobacco: Never  Used  . Alcohol Use: Yes     Comment: Very Infrequently   . Drug Use: No  . Sexual Activity: Not on file   Other Topics Concern  . Not on file   Social History Narrative    Review of Systems  Respiratory: Positive for shortness of breath (with moderate exertion).   Cardiovascular: Positive for chest pain (occasional in left chest ( cardiac cath in 2015)) and leg swelling. Negative for palpitations.  Musculoskeletal: Positive for arthralgias (knee pain).  Neurological: Positive for dizziness (occasional whoozy feeling). Negative for light-headedness and headaches.       Objective:   Filed Vitals:   07/07/15 0953  BP: 122/74  Pulse: 56  Temp: 97.9 F (36.6 C)  Resp: 18   Filed Weights   07/07/15 0953  Weight: 255 lb (115.667 kg)   Body mass index is 38.78 kg/(m^2).   Physical Exam Constitutional: Appears well-developed and well-nourished. No distress.  Neck: Neck supple. No tracheal deviation present. No thyromegaly present.  No carotid bruit. No cervical adenopathy.   Cardiovascular: Normal rate, regular rhythm and normal heart sounds.   No murmur heard. Pulmonary/Chest: Effort normal and breath sounds normal. No respiratory distress. No wheezes.  Musculoskeletal: Mild bilateral edema       Assessment & Plan:   See problem list for assessment and plan  Follow-up twice a year

## 2015-07-07 NOTE — Progress Notes (Signed)
Pre visit review using our clinic review tool, if applicable. No additional management support is needed unless otherwise documented below in the visit note. 

## 2015-07-09 ENCOUNTER — Encounter: Payer: Self-pay | Admitting: Internal Medicine

## 2015-07-13 MED FILL — LOSARTAN POTASSIUM 100 MG T: 100 | 90 days supply | Qty: 90 | Fill #0

## 2015-08-04 DIAGNOSIS — Z79899 Other long term (current) drug therapy: Secondary | ICD-10-CM | POA: Diagnosis not present

## 2015-08-04 DIAGNOSIS — G4733 Obstructive sleep apnea (adult) (pediatric): Secondary | ICD-10-CM | POA: Diagnosis not present

## 2015-08-04 DIAGNOSIS — E785 Hyperlipidemia, unspecified: Secondary | ICD-10-CM | POA: Diagnosis not present

## 2015-08-04 DIAGNOSIS — R569 Unspecified convulsions: Secondary | ICD-10-CM | POA: Diagnosis not present

## 2015-08-04 DIAGNOSIS — I10 Essential (primary) hypertension: Secondary | ICD-10-CM | POA: Diagnosis not present

## 2015-08-10 DIAGNOSIS — N401 Enlarged prostate with lower urinary tract symptoms: Secondary | ICD-10-CM | POA: Diagnosis not present

## 2015-08-10 DIAGNOSIS — R351 Nocturia: Secondary | ICD-10-CM | POA: Diagnosis not present

## 2015-08-10 DIAGNOSIS — R3915 Urgency of urination: Secondary | ICD-10-CM | POA: Diagnosis not present

## 2015-08-13 MED FILL — AMOXICILLIN 500 MG CAPSULE: 500 | 2 days supply | Qty: 8 | Fill #0

## 2015-08-28 ENCOUNTER — Encounter: Payer: Self-pay | Admitting: Internal Medicine

## 2015-08-28 ENCOUNTER — Ambulatory Visit (INDEPENDENT_AMBULATORY_CARE_PROVIDER_SITE_OTHER): Payer: Medicare Other | Admitting: Internal Medicine

## 2015-08-28 VITALS — BP 108/60 | HR 57 | Ht 68.0 in | Wt 258.0 lb

## 2015-08-28 DIAGNOSIS — K5010 Crohn's disease of large intestine without complications: Secondary | ICD-10-CM

## 2015-08-28 DIAGNOSIS — K8020 Calculus of gallbladder without cholecystitis without obstruction: Secondary | ICD-10-CM

## 2015-08-28 DIAGNOSIS — K5900 Constipation, unspecified: Secondary | ICD-10-CM | POA: Diagnosis not present

## 2015-08-28 DIAGNOSIS — K219 Gastro-esophageal reflux disease without esophagitis: Secondary | ICD-10-CM | POA: Diagnosis not present

## 2015-08-28 NOTE — Patient Instructions (Signed)
Please follow up with Dr Hilarie Fredrickson in 1 year.

## 2015-08-28 NOTE — Progress Notes (Signed)
Patient ID: Jeff Weaver, male   DOB: 1945-07-28, 70 y.o.   MRN: [id number] HPI: Jeff Weaver is a 70 yo male with PMH of GERD, HH, gallstones, Crohn's colitis (remote), diverticulosis, and IBS who is seen for the 1st time to establish care with me.  He was managed by Dr. Olevia Perches before her retirement and his last visit here was July 2016.  He is here today with his wife.  He reports today that he is feeling well. He has very occasional constipation which she considers to be mild. This happens less than one day per week. Occasionally he will see very scant blood on the toilet tissue only with wiping. Appetite is been good. No nausea or vomiting. Denies trouble swallowing. Denies abdominal pain. No right upper quadrant pain. No jaundice. No itching. He does have a history of GERD and indigestion. He is using ranitidine 150 mg every morning. Very rare if ever indigestion at night.  Last EGD 04/25/2014 by Dr. Olevia Perches, nonobstructing Schatzki's ring, 2 cm hiatal hernia and several fundic gland polyps in the proximal stomach which were biopsied. Pathology results revealed hyperplastic polyp without dysplasia or metaplasia.  Last colonoscopy 04/10/2013 -- performed for IBD surveillance. Mild sigmoid diverticulosis. Melanosis. No evidence of active colitis no pseudopolyps. Biopsies performed. Biopsies benign with no IBD or dysplasia  Past Medical History  Diagnosis Date  . Hyperlipidemia   . HTN (hypertension)   . Enteritis (regional)     Dr Olevia Perches  . Rheumatoid arthritis(714.0)     dxed in [employer] in 1980s  . Sleep apnea   . Cerebral aneurysm   . CVA (cerebral infarction)   . Seizures ([place])   . Diverticulosis   . GI bleed   . IBS (irritable bowel syndrome)   . 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 Nose surgery    . Colonoscopy w/ polypectomy  1978    negative 2009, Dr Olevia Perches. Due 2014  . Shoulder surgery  1997  . Hernia repair     . Tonsillectomy and adenoidectomy    . Craniotomy  02/02/12    Dr Harvel Ricks, [place] of aneurysm  . Cns shunt  02/23/12  . 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 Ventriculoperitoneal shunt  02-23-12    INSERTION OF RIGHT FRONTAL VENTRICULOPERITONEAL SHUNT WITH A CODMAN HAKIM PROGRAMMABLE VALVE  . Left heart catheterization with coronary angiogram N/A 11/26/2013    Procedure: LEFT HEART CATHETERIZATION WITH CORONARY ANGIOGRAM;  Surgeon: [name], MD;  Location: [facility] CATH LAB;  Service: Cardiovascular;  Laterality: N/A;    Outpatient Prescriptions Prior to Visit  Medication Sig Dispense Refill  . amoxicillin (AMOXIL) 500 MG capsule Take 2,000 mg by mouth. Prior to Dental work.    . Eslicarbazepine Acetate 400 MG TABS Take 600 mg by mouth 2 (two) times daily. Take 1.5 in AM and PM    . fluticasone (FLONASE) 50 MCG/ACT nasal spray Place 1 spray into both nostrils 2 (two) times daily. 16 g 5  . furosemide (LASIX) 20 MG tablet Take 1 tablet (20 mg total) by mouth daily. 90 tablet 0  . Lacosamide (VIMPAT) 100 MG TABS Take 300 mg by mouth 2 (two) times daily. 1 tablets in the am 3 in the PM    . levETIRAcetam (KEPPRA) 500 MG tablet Take 250 mg by mouth 2 (two) times daily.     Marland Kitchen  losartan (COZAAR) 50 MG tablet Take 1 tablet (50 mg total) by mouth daily. 90 tablet 3  . mirabegron ER (MYRBETRIQ) 25 MG TB24 tablet Take 25 mg by mouth.    Marland Kitchen NIFEdipine (PROCARDIA-XL/ADALAT-CC/NIFEDICAL-XL) 30 MG 24 hr tablet Take 1 tablet (30 mg total) by mouth daily. 90 tablet 0  . NON FORMULARY CPAP Machine: authorized by [place] in [place]    . pyridoxine (B-6) 100 MG tablet Take 100 mg by mouth daily.    . ranitidine (ZANTAC) 150 MG capsule Take 1 capsule (150 mg total) by mouth every morning. 90 capsule 1  . Tamsulosin HCl (FLOMAX) 0.4 MG CAPS Take 0.8 mg by mouth at bedtime.     . thiamine 100 MG tablet Take 100 mg by mouth daily.     No  facility-administered medications prior to visit.    Allergies  Allergen Reactions  . Haloperidol Other (See Comments)    Hallucinations  . Lisinopril     Cough   . Crestor [Rosuvastatin]     Leg pain mainly in knees   . Pravastatin     [facility] by [facility] 2009; "myositis" Ok to take per pt's wife/Dr. Linna Darner  . Simvastatin     2007: nausea & vomiting ; [facility] ( he does not remember such)    Family History  Problem Relation Age of Onset  . COPD Father   . Coronary artery disease Father     MI in 50s  . Hepatitis Mother   . Diabetes Sister   . Dementia Sister 3  . Diabetes Brother   . Colon cancer Neg Hx     Social History  Substance Use Topics  . Smoking status: Former Smoker    Quit date: 07/04/2009  . Smokeless tobacco: Never Used  . Alcohol Use: 0.0 oz/week    0 Standard drinks or equivalent per week     Comment: Very Infrequently     ROS: As per history of present illness, otherwise negative  BP 108/60 mmHg  Pulse 57  Ht 5' 8"  (1.727 m)  Wt 258 lb (117.028 kg)  BMI 39.24 kg/m2 Constitutional: Well-developed and well-nourished. No distress. HEENT: Normocephalic and atraumatic.  Conjunctivae are normal.  No scleral icterus. Neck: Neck supple. Trachea midline. Cardiovascular: Normal rate, regular rhythm and intact distal pulses. No M/R/G Pulmonary/chest: Effort normal and breath sounds normal. No wheezing, rales or rhonchi. Abdominal: Soft, obese, nontender, nondistended. Bowel sounds active throughout. Extremities: no clubbing, cyanosis, trace pretib edema Neurological: Alert and oriented to person place and time. Skin: Skin is warm and dry.  Psychiatric: Normal mood and affect. Behavior is normal.  RELEVANT LABS AND IMAGING: CBC    Component Value Date/Time   WBC 8.5 07/07/2015 1141   RBC 4.96 07/07/2015 1141   HGB 15.2 07/07/2015 1141   HCT 45.8 07/07/2015 1141   PLT 213.0 07/07/2015 1141   MCV 92.4 07/07/2015 1141   MCH 31.8 11/27/2013 0716   MCHC  33.1 07/07/2015 1141   RDW 13.6 07/07/2015 1141   LYMPHSABS 1.3 07/07/2015 1141   MONOABS 0.6 07/07/2015 1141   EOSABS 0.3 07/07/2015 1141   BASOSABS 0.0 07/07/2015 1141    CMP     Component Value Date/Time   NA 139 07/07/2015 1141   K 4.3 07/07/2015 1141   CL 99 07/07/2015 1141   CO2 32 07/07/2015 1141   GLUCOSE 107* 07/07/2015 1141   BUN 14 07/07/2015 1141   CREATININE 1.00 07/07/2015 1141   CALCIUM 9.9 07/07/2015 1141  PROT 7.1 07/07/2015 1141   ALBUMIN 4.3 07/07/2015 1141   AST 22 07/07/2015 1141   ALT 38 07/07/2015 1141   ALKPHOS 47 07/07/2015 1141   BILITOT 0.4 07/07/2015 1141   GFRNONAA 84* 11/27/2013 0716   GFRAA >90 11/27/2013 0716    ASSESSMENT/PLAN:  70 yo male with PMH of GERD, HH, gallstones, Crohn's colitis (remote), diverticulosis, and IBS who is seen for the 1st time to establish care with me.   1. Gallstones -- asymptomatic. No evidence of biliary obstruction. He does have low gallbladder ejection fraction by previous HIDA scan. Study reviewed. Given asymptomatic state will not refer for cholecystectomy. I've asked that he notify me should he develop right upper quadrant pain, nausea, vomiting and he voices understanding. He is happy with this plan  2. GERD/indigestion -- tolerating ranitidine 150 mg daily. I advised to get a second dose in the evening if necessary for as needed heartburn or indigestion. No evidence for Barrett's esophagus on last EGD  3. Constipation, mild -- no true constipation symptoms. I recommended Benefiber 1 tablespoon daily to help prevent constipation.  4. History of Crohn's colitis -- remote and no colitis, active or quiescent, on last colonoscopy which is reassuring. No evidence of dysplasia. Given no evidence of microscopic inflammation he likely does not require IBD surveillance every 12-24 months. We discussed this and he is in agreement and does not wish for repeat colonoscopy at this time.  Follow-up in one year, sooner if  necessary 25 minutes spent with the patient today. Greater than 50% was spent in counseling and coordination of care with the patient     Cc:Hendricks Limes, Md [address], [place] [address]

## 2015-09-14 ENCOUNTER — Other Ambulatory Visit: Payer: Self-pay | Admitting: Emergency Medicine

## 2015-09-14 DIAGNOSIS — I10 Essential (primary) hypertension: Secondary | ICD-10-CM

## 2015-09-14 MED ORDER — LOSARTAN POTASSIUM 50 MG PO TABS: 50.0000 mg | ORAL_TABLET | Freq: Every day | ORAL | Status: DC

## 2015-09-14 MED ORDER — FUROSEMIDE 20 MG PO TABS: 20.0000 mg | ORAL_TABLET | Freq: Every day | ORAL | Status: DC

## 2015-09-14 MED ORDER — NIFEDIPINE ER OSMOTIC RELEASE 30 MG PO TB24: 30.0000 mg | ORAL_TABLET | Freq: Every day | ORAL | Status: DC

## 2015-09-21 MED FILL — NIFEDICAL XL 30 MG TABLET: 30 | 90 days supply | Qty: 90 | Fill #0

## 2015-09-21 MED FILL — LOSARTAN POTASSIUM 50 MG TA: 50 | 90 days supply | Qty: 90 | Fill #0

## 2015-09-21 MED FILL — FUROSEMIDE 20 MG TABLET: 20 | 90 days supply | Qty: 90 | Fill #0

## 2015-10-13 MED FILL — PREVIDENT 5000 1.1% DRY MOU: 1.1 | 30 days supply | Qty: 100 | Fill #0

## 2015-10-17 DIAGNOSIS — G9389 Other specified disorders of brain: Secondary | ICD-10-CM | POA: Diagnosis not present

## 2015-10-17 DIAGNOSIS — G4733 Obstructive sleep apnea (adult) (pediatric): Secondary | ICD-10-CM | POA: Diagnosis not present

## 2015-10-17 DIAGNOSIS — E785 Hyperlipidemia, unspecified: Secondary | ICD-10-CM | POA: Diagnosis not present

## 2015-10-17 DIAGNOSIS — G40909 Epilepsy, unspecified, not intractable, without status epilepticus: Secondary | ICD-10-CM | POA: Diagnosis not present

## 2015-10-17 DIAGNOSIS — I10 Essential (primary) hypertension: Secondary | ICD-10-CM | POA: Diagnosis not present

## 2015-10-17 DIAGNOSIS — R569 Unspecified convulsions: Secondary | ICD-10-CM | POA: Diagnosis not present

## 2015-10-17 DIAGNOSIS — Z87891 Personal history of nicotine dependence: Secondary | ICD-10-CM | POA: Diagnosis not present

## 2015-10-18 DIAGNOSIS — R569 Unspecified convulsions: Secondary | ICD-10-CM | POA: Diagnosis not present

## 2015-10-18 LAB — CBC AND DIFFERENTIAL
HCT: 43 % (ref 41–53)
Hemoglobin: 14.5 g/dL (ref 13.5–17.5)
Platelets: 168 10*3/uL (ref 150–399)
WBC: 9.8 10^3/mL

## 2015-10-18 LAB — BASIC METABOLIC PANEL
BUN: 17 mg/dL (ref 4–21)
Creatinine: 0.9 mg/dL (ref 0.6–1.3)
Glucose: 107 mg/dL
Potassium: 4 mmol/L (ref 3.4–5.3)
Sodium: 135 mmol/L — AB (ref 137–147)

## 2015-10-26 ENCOUNTER — Encounter: Payer: Self-pay | Admitting: Internal Medicine

## 2015-10-26 ENCOUNTER — Ambulatory Visit (INDEPENDENT_AMBULATORY_CARE_PROVIDER_SITE_OTHER): Payer: Medicare Other | Admitting: Internal Medicine

## 2015-10-26 VITALS — BP 122/74 | HR 56 | Temp 97.7°F | Resp 18 | Wt 256.0 lb

## 2015-10-26 DIAGNOSIS — R0981 Nasal congestion: Secondary | ICD-10-CM

## 2015-10-26 DIAGNOSIS — R7303 Prediabetes: Secondary | ICD-10-CM | POA: Diagnosis not present

## 2015-10-26 DIAGNOSIS — E162 Hypoglycemia, unspecified: Secondary | ICD-10-CM | POA: Diagnosis not present

## 2015-10-26 DIAGNOSIS — G40909 Epilepsy, unspecified, not intractable, without status epilepticus: Secondary | ICD-10-CM

## 2015-10-26 DIAGNOSIS — I10 Essential (primary) hypertension: Secondary | ICD-10-CM | POA: Diagnosis not present

## 2015-10-26 MED ORDER — CETIRIZINE HCL 10 MG PO TABS: 10.0000 mg | ORAL_TABLET | Freq: Every day | ORAL | Status: AC

## 2015-10-26 MED ORDER — BLOOD GLUCOSE MONITOR KIT: PACK | Status: DC

## 2015-10-26 MED ORDER — THERA VITAL M PO TABS: 1.0000 | ORAL_TABLET | Freq: Every day | ORAL | Status: DC

## 2015-10-26 NOTE — Assessment & Plan Note (Signed)
BP well controlled Current regimen effective and well tolerated Continue current medications at current doses  

## 2015-10-26 NOTE — Progress Notes (Signed)
Subjective:    Patient ID: Jeff Weaver, male    DOB: Nov 05, 1945, 70 y.o.   MRN: [id number]  HPI He is here for follow up.  He went to the hospital for a seizure.   He had a seizure on 4/15.  His main symptoms is inability to talk.  His family called EMS due to concerns of a possible stroke.  He has been having seizures every 6 months, but in the past 6 months they have been every 3 months.  His neurologist has been adjusting his medication - raising one medication dose and lowing one.  Seizures now coming every three months, but he is coming out of the seziures quicker and they do not last as long.  His wife wonders about having ativan on hand at home if he has symptoms of a stroke to give him so they do not have to do to the hospital and this might abort the seizure quickly.     Nasal congestion:  He has nasal congestion and in the morning has to blow his nose 10-15 times every morning.  The mucus is clear.  He has nasal congestion a lot. He can breath through one nare at a time.  He has had nasal surgery years ago.  He has used flonase without improvement in his symptoms.  He wondered about a allergy medication.  The nasal congestion causes some sob.   His feet hurt so much he can not walk on them   It will occur for three or four days and then goes  away.  He does not have it daily and is unsure if foot wear makes a difference.   Lack of energy.  Difficulty with problems with equilibrium.  He needs to stand still when he first stands because he does not have.  Leaning over makes him have balance issues.   He is walking regularly - about 2 miles at a slow pace.   Leaning over the other day to an extreme - heard a pop in left ribs. Still sore  but better.   Problems with his vision.  Sometimes can see better without glasses.    Medications and allergies reviewed with patient and updated if appropriate.  Patient Active Problem List   Diagnosis Date Noted  . Hyperglycemia 07/07/2015  .  Cholelithiasis 05/02/2015  . GERD (gastroesophageal reflux disease) 07/14/2014  . PMR (polymyalgia rheumatica) ([place]) 05/28/2014  . Hyponatremia 05/28/2014  . Hypokalemia 05/28/2014  . 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 Elevated liver enzymes 05/28/2014  . NSTEMI (non-ST elevated myocardial infarction) ([place]) 11/25/2013  . SBE (subacute bacterial endocarditis) prophylaxis candidate 11/24/2013  . Pre-diabetes 03/26/2013  . Epigastric discomfort 01/08/2013  . Edema 01/08/2013  . Seizure disorder ([place]) 07/23/2012  . Cerebral aneurysm 11/23/2011  . OSA (obstructive sleep apnea) 08/09/2011  . Other and unspecified hyperlipidemia 02/03/2009  . Essential hypertension 02/03/2009  . First degree atrioventricular block 02/03/2009  . OSTEOARTHRITIS 02/03/2009  . REGIONAL ENTERITIS, LARGE INTESTINE 04/17/2008    Current Outpatient Prescriptions on File Prior to Visit  Medication Sig Dispense Refill  . amoxicillin (AMOXIL) 500 MG capsule Take 2,000 mg by mouth. Prior to Dental work.    . Eslicarbazepine Acetate 400 MG TABS Take 600 mg by mouth 2 (two) times daily. Take 1.5 in AM and PM    . fluticasone (FLONASE) 50 MCG/ACT nasal spray Place 1 spray into both nostrils 2 (two) times daily. 16 g 5  . furosemide (LASIX) 20 MG tablet Take  1 tablet (20 mg total) by mouth daily. 90 tablet 3  . Lacosamide (VIMPAT) 100 MG TABS Take 300 mg by mouth 2 (two) times daily. 1 tablets in the am 2 in the PM    . levETIRAcetam (KEPPRA) 500 MG tablet Take 250 mg by mouth 2 (two) times daily.     Marland Kitchen losartan (COZAAR) 50 MG tablet Take 1 tablet (50 mg total) by mouth daily. 90 tablet 3  . mirabegron ER (MYRBETRIQ) 25 MG TB24 tablet Take 25 mg by mouth.    Marland Kitchen NIFEdipine (PROCARDIA-XL/ADALAT-CC/NIFEDICAL-XL) 30 MG 24 hr tablet Take 1 tablet (30 mg total) by mouth daily. 90 tablet 3  . NON FORMULARY CPAP Machine: authorized by [place] in [place]    . pyridoxine (B-6) 100 MG tablet Take 100 mg by mouth daily.    . ranitidine (ZANTAC)  150 MG capsule Take 1 capsule (150 mg total) by mouth every morning. 90 capsule 1  . Tamsulosin HCl (FLOMAX) 0.4 MG CAPS Take 0.8 mg by mouth at bedtime.     . thiamine 100 MG tablet Take 100 mg by mouth daily.     No current facility-administered medications on file prior to visit.    Past Medical History  Diagnosis Date  . Hyperlipidemia   . HTN (hypertension)   . Enteritis (regional)     Dr Olevia Perches  . Rheumatoid arthritis(714.0)     dxed in [employer] in 1980s  . Sleep apnea   . Cerebral aneurysm   . CVA (cerebral infarction)   . Seizures ([place])   . Diverticulosis   . GI bleed   . IBS (irritable bowel syndrome)   . 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 Nose surgery    . Colonoscopy w/ polypectomy  1978    negative 2009, Dr Olevia Perches. Due 2014  . Shoulder surgery  1997  . Hernia repair    . Tonsillectomy and adenoidectomy    . Craniotomy  02/02/12    Dr Harvel Ricks, [place] of aneurysm  . Cns shunt  02/23/12  . 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 Ventriculoperitoneal shunt  02-23-12    INSERTION OF RIGHT FRONTAL VENTRICULOPERITONEAL SHUNT WITH A CODMAN HAKIM PROGRAMMABLE VALVE  . Left heart catheterization with coronary angiogram N/A 11/26/2013    Procedure: LEFT HEART CATHETERIZATION WITH CORONARY ANGIOGRAM;  Surgeon: [name], MD;  Location: [facility] CATH LAB;  Service: Cardiovascular;  Laterality: N/A;    Social History   Social History  . Marital Status: Married    Spouse Name: N/A  . Number of Children: 1  . Years of Education: N/A   Occupational History  . retired    Social History Main Topics  . Smoking status: Former Smoker    Quit date: 07/04/2009  . Smokeless tobacco: Never Used  . Alcohol Use: 0.0 oz/week    0 Standard drinks or equivalent per week     Comment: Very Infrequently   . Drug Use: No  . Sexual  Activity: Not on file   Other Topics Concern  . Not on file   Social History Narrative    Family History  Problem Relation Age of Onset  . COPD Father   . Coronary artery disease Father     MI in 3s  . Hepatitis Mother   . Diabetes Sister   . Dementia Sister 93  .  Diabetes Brother   . Colon cancer Neg Hx     Review of Systems  Respiratory: Positive for apnea and shortness of breath (with exercise). Negative for cough and wheezing.   Cardiovascular: Positive for chest pain (pain across chest - pressure, intermittent for one hour - evaluated - not cardiac) and leg swelling. Negative for palpitations.  Neurological: Positive for light-headedness. Negative for headaches.       Objective:   Filed Vitals:   10/26/15 0831  BP: 122/74  Pulse: 56  Temp: 97.7 F (36.5 C)  Resp: 18   Filed Weights   10/26/15 0831  Weight: 256 lb (116.121 kg)   Body mass index is 38.93 kg/(m^2).   Physical Exam Constitutional: Appears well-developed and well-nourished. No distress.  Neck: Neck supple. No tracheal deviation present. No thyromegaly present.  No carotid bruit. No cervical adenopathy.   Cardiovascular: Normal rate, regular rhythm and normal heart sounds.   1/6 systolic murmur heard.  trace edema Pulmonary/Chest: Effort normal and breath sounds normal. No respiratory distress. No wheezes.       Assessment & Plan:   See Problem List for Assessment and Plan of chronic medical problems.  F/u in 3 months

## 2015-10-26 NOTE — Assessment & Plan Note (Addendum)
Having more frequent seizure (every 3 mo) but they are not lasting as long Sees Dr Inocente Salles  - medication has been adjusted They will f/u with her and ask her about ativan at home to take as needed for seizure like symptoms (inability to speak) Discussed decreased risk for possible seizure (dehydraton and hypoglycemia)

## 2015-10-26 NOTE — Patient Instructions (Addendum)
   Medications reviewed and updated.  Changes include trying zyrtec at night and retrying flonase.     Please followup in 3 months

## 2015-10-26 NOTE — Assessment & Plan Note (Signed)
Likely related to allergies and deviated septum Continue flonase Start zyrtec Can see ent if needed

## 2015-10-26 NOTE — Assessment & Plan Note (Signed)
With episodes of hypoglycemia Glucometer to monitor sugars at home periodically and if he does not feel well

## 2015-10-26 NOTE — Progress Notes (Signed)
Pre visit review using our clinic review tool, if applicable. No additional management support is needed unless otherwise documented below in the visit note. 

## 2015-10-29 ENCOUNTER — Other Ambulatory Visit: Payer: Self-pay | Admitting: Internal Medicine

## 2015-10-29 ENCOUNTER — Encounter: Payer: Self-pay | Admitting: Internal Medicine

## 2015-10-30 DIAGNOSIS — Z79899 Other long term (current) drug therapy: Secondary | ICD-10-CM | POA: Diagnosis not present

## 2015-10-30 DIAGNOSIS — I10 Essential (primary) hypertension: Secondary | ICD-10-CM | POA: Diagnosis not present

## 2015-10-30 DIAGNOSIS — Z8669 Personal history of other diseases of the nervous system and sense organs: Secondary | ICD-10-CM | POA: Diagnosis not present

## 2015-10-30 DIAGNOSIS — E785 Hyperlipidemia, unspecified: Secondary | ICD-10-CM | POA: Diagnosis not present

## 2015-10-30 DIAGNOSIS — Z9989 Dependence on other enabling machines and devices: Secondary | ICD-10-CM | POA: Diagnosis not present

## 2015-10-30 DIAGNOSIS — G4733 Obstructive sleep apnea (adult) (pediatric): Secondary | ICD-10-CM | POA: Diagnosis not present

## 2015-10-30 DIAGNOSIS — R569 Unspecified convulsions: Secondary | ICD-10-CM | POA: Diagnosis not present

## 2015-10-30 DIAGNOSIS — Z72820 Sleep deprivation: Secondary | ICD-10-CM | POA: Diagnosis not present

## 2015-10-30 DIAGNOSIS — G479 Sleep disorder, unspecified: Secondary | ICD-10-CM | POA: Diagnosis not present

## 2015-10-30 DIAGNOSIS — R4789 Other speech disturbances: Secondary | ICD-10-CM | POA: Diagnosis not present

## 2015-11-02 DIAGNOSIS — H5203 Hypermetropia, bilateral: Secondary | ICD-10-CM | POA: Diagnosis not present

## 2015-11-02 DIAGNOSIS — H52203 Unspecified astigmatism, bilateral: Secondary | ICD-10-CM | POA: Diagnosis not present

## 2015-11-02 DIAGNOSIS — H25813 Combined forms of age-related cataract, bilateral: Secondary | ICD-10-CM | POA: Diagnosis not present

## 2015-11-02 DIAGNOSIS — H524 Presbyopia: Secondary | ICD-10-CM | POA: Diagnosis not present

## 2015-12-21 MED FILL — FUROSEMIDE 20 MG TABLET: 20 | 90 days supply | Qty: 90 | Fill #1

## 2015-12-21 MED FILL — NIFEDICAL XL 30 MG TABLET: 30 | 90 days supply | Qty: 90 | Fill #1

## 2015-12-22 DIAGNOSIS — M722 Plantar fascial fibromatosis: Secondary | ICD-10-CM | POA: Diagnosis not present

## 2015-12-24 ENCOUNTER — Ambulatory Visit: Payer: Medicare Other | Admitting: Family Medicine

## 2016-01-04 ENCOUNTER — Inpatient Hospital Stay (HOSPITAL_BASED_OUTPATIENT_CLINIC_OR_DEPARTMENT_OTHER)
Admission: EM | Admit: 2016-01-04 | Discharge: 2016-01-10 | DRG: 853 | Disposition: A | Discharge status: Home / Self Care | Payer: Medicare Other | Attending: Internal Medicine | Admitting: Internal Medicine

## 2016-01-04 ENCOUNTER — Emergency Department (HOSPITAL_BASED_OUTPATIENT_CLINIC_OR_DEPARTMENT_OTHER): Payer: Medicare Other

## 2016-01-04 ENCOUNTER — Encounter (HOSPITAL_BASED_OUTPATIENT_CLINIC_OR_DEPARTMENT_OTHER): Payer: Self-pay | Admitting: *Deleted

## 2016-01-04 DIAGNOSIS — N401 Enlarged prostate with lower urinary tract symptoms: Secondary | ICD-10-CM | POA: Diagnosis present

## 2016-01-04 DIAGNOSIS — Z8673 Personal history of transient ischemic attack (TIA), and cerebral infarction without residual deficits: Secondary | ICD-10-CM

## 2016-01-04 DIAGNOSIS — E860 Dehydration: Secondary | ICD-10-CM | POA: Diagnosis present

## 2016-01-04 DIAGNOSIS — R778 Other specified abnormalities of plasma proteins: Secondary | ICD-10-CM | POA: Diagnosis present

## 2016-01-04 DIAGNOSIS — I959 Hypotension, unspecified: Secondary | ICD-10-CM | POA: Diagnosis not present

## 2016-01-04 DIAGNOSIS — Z982 Presence of cerebrospinal fluid drainage device: Secondary | ICD-10-CM | POA: Diagnosis not present

## 2016-01-04 DIAGNOSIS — I10 Essential (primary) hypertension: Secondary | ICD-10-CM | POA: Diagnosis present

## 2016-01-04 DIAGNOSIS — F05 Delirium due to known physiological condition: Secondary | ICD-10-CM | POA: Diagnosis not present

## 2016-01-04 DIAGNOSIS — R918 Other nonspecific abnormal finding of lung field: Secondary | ICD-10-CM | POA: Diagnosis not present

## 2016-01-04 DIAGNOSIS — R748 Abnormal levels of other serum enzymes: Secondary | ICD-10-CM | POA: Diagnosis present

## 2016-01-04 DIAGNOSIS — E669 Obesity, unspecified: Secondary | ICD-10-CM | POA: Diagnosis present

## 2016-01-04 DIAGNOSIS — G9341 Metabolic encephalopathy: Secondary | ICD-10-CM | POA: Diagnosis present

## 2016-01-04 DIAGNOSIS — R1030 Lower abdominal pain, unspecified: Secondary | ICD-10-CM | POA: Diagnosis not present

## 2016-01-04 DIAGNOSIS — M353 Polymyalgia rheumatica: Secondary | ICD-10-CM | POA: Diagnosis present

## 2016-01-04 DIAGNOSIS — I4581 Long QT syndrome: Secondary | ICD-10-CM | POA: Diagnosis present

## 2016-01-04 DIAGNOSIS — R6520 Severe sepsis without septic shock: Secondary | ICD-10-CM | POA: Diagnosis present

## 2016-01-04 DIAGNOSIS — G40909 Epilepsy, unspecified, not intractable, without status epilepticus: Secondary | ICD-10-CM

## 2016-01-04 DIAGNOSIS — R7989 Other specified abnormal findings of blood chemistry: Secondary | ICD-10-CM | POA: Diagnosis present

## 2016-01-04 DIAGNOSIS — N179 Acute kidney failure, unspecified: Secondary | ICD-10-CM | POA: Diagnosis present

## 2016-01-04 DIAGNOSIS — J189 Pneumonia, unspecified organism: Secondary | ICD-10-CM

## 2016-01-04 DIAGNOSIS — A419 Sepsis, unspecified organism: Secondary | ICD-10-CM

## 2016-01-04 DIAGNOSIS — M069 Rheumatoid arthritis, unspecified: Secondary | ICD-10-CM | POA: Diagnosis present

## 2016-01-04 DIAGNOSIS — N39498 Other specified urinary incontinence: Secondary | ICD-10-CM | POA: Diagnosis present

## 2016-01-04 DIAGNOSIS — Z7951 Long term (current) use of inhaled steroids: Secondary | ICD-10-CM

## 2016-01-04 DIAGNOSIS — K8020 Calculus of gallbladder without cholecystitis without obstruction: Secondary | ICD-10-CM

## 2016-01-04 DIAGNOSIS — K8000 Calculus of gallbladder with acute cholecystitis without obstruction: Secondary | ICD-10-CM | POA: Diagnosis not present

## 2016-01-04 DIAGNOSIS — K810 Acute cholecystitis: Secondary | ICD-10-CM | POA: Diagnosis present

## 2016-01-04 DIAGNOSIS — E871 Hypo-osmolality and hyponatremia: Secondary | ICD-10-CM | POA: Diagnosis present

## 2016-01-04 DIAGNOSIS — Z6838 Body mass index (BMI) 38.0-38.9, adult: Secondary | ICD-10-CM

## 2016-01-04 DIAGNOSIS — Z87891 Personal history of nicotine dependence: Secondary | ICD-10-CM | POA: Diagnosis not present

## 2016-01-04 DIAGNOSIS — G4733 Obstructive sleep apnea (adult) (pediatric): Secondary | ICD-10-CM | POA: Diagnosis present

## 2016-01-04 DIAGNOSIS — R4182 Altered mental status, unspecified: Secondary | ICD-10-CM | POA: Diagnosis not present

## 2016-01-04 DIAGNOSIS — R109 Unspecified abdominal pain: Secondary | ICD-10-CM

## 2016-01-04 DIAGNOSIS — E876 Hypokalemia: Secondary | ICD-10-CM | POA: Diagnosis present

## 2016-01-04 DIAGNOSIS — K828 Other specified diseases of gallbladder: Secondary | ICD-10-CM | POA: Diagnosis present

## 2016-01-04 DIAGNOSIS — E785 Hyperlipidemia, unspecified: Secondary | ICD-10-CM | POA: Diagnosis present

## 2016-01-04 LAB — CBC WITH DIFFERENTIAL/PLATELET
Basophils Absolute: 0 10*3/uL (ref 0.0–0.1)
Basophils Relative: 0 %
Eosinophils Absolute: 0.1 10*3/uL (ref 0.0–0.7)
Eosinophils Relative: 1 %
HCT: 38.4 % — ABNORMAL LOW (ref 39.0–52.0)
Hemoglobin: 13.4 g/dL (ref 13.0–17.0)
Lymphocytes Relative: 7 %
Lymphs Abs: 1.2 10*3/uL (ref 0.7–4.0)
MCH: 31.7 pg (ref 26.0–34.0)
MCHC: 34.9 g/dL (ref 30.0–36.0)
MCV: 90.8 fL (ref 78.0–100.0)
Monocytes Absolute: 1.6 10*3/uL — ABNORMAL HIGH (ref 0.1–1.0)
Monocytes Relative: 10 %
Neutro Abs: 13.9 10*3/uL — ABNORMAL HIGH (ref 1.7–7.7)
Neutrophils Relative %: 82 %
Platelets: 164 10*3/uL (ref 150–400)
RBC: 4.23 MIL/uL (ref 4.22–5.81)
RDW: 13.4 % (ref 11.5–15.5)
WBC: 16.8 10*3/uL — ABNORMAL HIGH (ref 4.0–10.5)

## 2016-01-04 LAB — I-STAT CG4 LACTIC ACID, ED: Lactic Acid, Venous: 0.87 mmol/L (ref 0.5–1.9)

## 2016-01-04 MED ORDER — SODIUM CHLORIDE 0.9 % IV BOLUS (SEPSIS)
1000.0000 mL | Freq: Once | INTRAVENOUS | Status: AC
  Administered 2016-01-05: 1000 mL via INTRAVENOUS

## 2016-01-04 MED ORDER — SODIUM CHLORIDE 0.9 % IV BOLUS (SEPSIS)
1000.0000 mL | Freq: Once | INTRAVENOUS | Status: AC
  Administered 2016-01-04: 1000 mL via INTRAVENOUS

## 2016-01-04 MED ORDER — SODIUM CHLORIDE 0.9 % IV SOLN
1000.0000 mL | INTRAVENOUS | Status: DC
  Administered 2016-01-04: 1000 mL via INTRAVENOUS

## 2016-01-04 MED ORDER — VANCOMYCIN HCL IN DEXTROSE 1-5 GM/200ML-% IV SOLN
1000.0000 mg | Freq: Once | INTRAVENOUS | Status: AC
Start: 2016-01-05 — End: 2016-01-05
  Administered 2016-01-05: 1000 mg via INTRAVENOUS
  Filled 2016-01-04: qty 200

## 2016-01-04 MED ORDER — PIPERACILLIN-TAZOBACTAM 3.375 G IVPB 30 MIN
3.3750 g | Freq: Once | INTRAVENOUS | Status: AC
  Administered 2016-01-05: 3.375 g via INTRAVENOUS
  Filled 2016-01-04 (×2): qty 50

## 2016-01-04 NOTE — ED Notes (Signed)
Per family some confusion x 3 hours,  Pt answers questions,  Grips equal  No facial droop

## 2016-01-04 NOTE — ED Notes (Signed)
Wife states he has been confused x 3 hours. Hx of seizures, last one 4 days ago. Lower abdominal pain yesterday. He is hot to touch.

## 2016-01-04 NOTE — ED Provider Notes (Signed)
TIME SEEN: 11:23 PM By signing my name below, I, Dyke Brackett, attest that this documentation has been prepared under the direction and in the presence of Hood, DO . Electronically Signed: Dyke Brackett, Scribe. 01/04/2016. 11:58 PM.  CHIEF COMPLAINT:  Chief Complaint  Patient presents with  . Altered Mental Status   HPI: HPI Comments:  Jeff Weaver is a 70 y.o. 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 Per wife, pt has been experiencing confusion x 3 hours. Wife states he was last normal around 8 pm this evening. Per wife, when they arrived at ED he was confused about how to exit car.  Per family, he experienced associated nausea, fatigue and chest pain that he describes as a diffuse tightness, and cough two nights ago. Family states he also has decreased appetite and has not wanted to hydrate. They are unsure about fever, but state he has been hot to the touch. His last seizure was 4 days ago. Per daughter, pt typically has seizures every 6 months, but recently the seizures have been more frequent (about every three months). His neurologist is Dr. Yvonne Kendall at [facility] and they also follow up with his neurosurgeon Dr. Harvel Ricks at [facility]. His PCP is Dr. Billey Gosling at [place]. He is not on any blood thinners. Pt denies any headache, neck pain, current chest pain, abdominal pain, vomiting or diarrhea, dysuria, rash. No known sick contacts. Denies numbness or focal weakness.    ROS: See HPI Constitutional: Subjective fever  Eyes: no drainage  ENT: runny nose   Cardiovascular: chest pain 2 days ago Resp: no SOB  GI: no vomiting GU: no dysuria Integumentary: no rash  Allergy: no hives  Musculoskeletal: no leg swelling  Neurological: no slurred speech ROS otherwise  negative  PAST MEDICAL HISTORY/PAST SURGICAL HISTORY:  Past Medical History  Diagnosis Date  . Hyperlipidemia   . HTN (hypertension)   . Enteritis (regional)     Dr Olevia Perches  . Rheumatoid arthritis(714.0)     dxed in [employer] in 1980s  . Sleep apnea   . Cerebral aneurysm   . CVA (cerebral infarction)   . Seizures ([place])   . Diverticulosis   . GI bleed   . IBS (irritable bowel syndrome)   . 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 Authorizing Provider  amoxicillin (AMOXIL) 500 MG capsule Take 2,000 mg by mouth. Prior to Dental work.   Yes Historical Provider, MD  blood glucose meter kit and supplies KIT Dispense based on patient and insurance preference. Use up to four times daily as directed. (prediabetes hypoglycemia). 10/26/15  Yes [name], MD  cetirizine (ZYRTEC) 10 MG tablet Take 1 tablet (10 mg total) by mouth daily. 10/26/15  Yes [name], MD  Eslicarbazepine Acetate 400 MG TABS Take 600 mg by mouth 2 (two) times daily. Take 1.5 in AM and PM   Yes Historical Provider, MD  fluticasone (FLONASE) 50 MCG/ACT nasal spray Place 1 spray into both nostrils 2 (two) times daily. 05/13/14  Yes Hendricks Limes, MD  furosemide (LASIX) 20 MG tablet Take 1 tablet (20 mg total) by mouth daily. 09/14/15  Yes [name], MD  Lacosamide (VIMPAT)  100 MG TABS Take 300 mg by mouth 2 (two) times daily. 1 tablets in the am 2 in the PM   Yes Historical Provider, MD  levETIRAcetam (KEPPRA) 500 MG tablet Take 250 mg by mouth 2 (two) times daily.    Yes Historical Provider, MD  losartan (COZAAR) 50 MG tablet Take 1 tablet (50 mg total) by mouth daily. 09/14/15  Yes [name], MD  mirabegron ER (MYRBETRIQ) 25 MG TB24 tablet Take 25 mg by mouth. 01/26/15  Yes Historical Provider, MD  Multiple Vitamins-Minerals (MULTIVITAMIN) tablet Take 1 tablet by mouth daily. 10/26/15  Yes [name], MD   NIFEdipine (PROCARDIA-XL/ADALAT-CC/NIFEDICAL-XL) 30 MG 24 hr tablet Take 1 tablet (30 mg total) by mouth daily. 09/14/15  Yes [name], MD  pyridoxine (B-6) 100 MG tablet Take 100 mg by mouth daily.   Yes Historical Provider, MD  ranitidine (ZANTAC) 150 MG capsule TAKE 1 CAPSULE EVERY MORNING 10/29/15  Yes Jerene Bears, MD  Tamsulosin HCl (FLOMAX) 0.4 MG CAPS Take 0.8 mg by mouth at bedtime.    Yes Historical Provider, MD  thiamine 100 MG tablet Take 100 mg by mouth daily.   Yes Historical Provider, MD  NON FORMULARY CPAP Machine: authorized by [facility] in [employer], MD    ALLERGIES:  Allergies  Allergen Reactions  . Haloperidol Other (See Comments)    Hallucinations  . Lisinopril     Cough   . Crestor [Rosuvastatin]     Leg pain mainly in knees   . Pravastatin     Rxed by [facility] 2009; "myositis" Ok to take per pt's wife/Dr. Linna Darner  . Simvastatin     2007: nausea & vomiting ; [facility] ( he does not remember such)    SOCIAL HISTORY:  Social History  Substance Use Topics  . Smoking status: Former Smoker    Quit date: 07/04/2009  . Smokeless tobacco: Never Used  . Alcohol Use: 0.0 oz/week    0 Standard drinks or equivalent per week     Comment: Very Infrequently     FAMILY HISTORY: Family History  Problem Relation Age of Onset  . COPD Father   . Coronary artery disease Father     MI in 47s  . Hepatitis Mother   . Diabetes Sister   . Dementia Sister 27  . Diabetes Brother   . Colon cancer Neg Hx     EXAM: BP 93/52 mmHg  Pulse 70  Temp(Src) 99.9 F (37.7 C) (Oral)  Resp 22  Ht 5' 8"  (1.727 m)  Wt 256 lb (116.121 kg)  BMI 38.93 kg/m2  SpO2 93% CONSTITUTIONAL: Alert and oriented x3 and responds appropriately to questions. Well-appearing; well-nourished. Elderly, febrile, nontoxic. Smiling, laughing, making jokes HEAD: Normocephalic, VP shunt to left scalp with no surrounding erythema, warmth, tenderness or swelling.  EYES: Conjunctivae clear,  PERRL ENT: normal nose; no rhinorrhea; moist mucous membranes; No pharyngeal erythema or petechiae, no tonsillar hypertrophy or exudate, no uvular deviation, no trismus or drooling, normal phonation, no stridor, no dental caries or abscess noted, no Ludwig's angina, tongue sits flat in the bottom of the mouth NECK: Supple, no meningismus, no LAD; no nuchal rigidity CARD: RRR; S1 and S2 appreciated; no murmurs, no clicks, no rubs, no gallops RESP: Normal chest excursion without splinting or tachypnea; breath sounds clear and equal bilaterally; no wheezes, no rhonchi, no rales, no hypoxia or respiratory distress, speaking full sentences ABD/GI: Normal bowel sounds; non-distended; soft, non-tender, no  rebound, no guarding, no peritoneal signs BACK:  The back appears normal and is non-tender to palpation, there is no CVA tenderness EXT: Normal ROM in all joints; non-tender to palpation; no edema; normal capillary refill; no cyanosis, no calf tenderness or swelling    SKIN: Normal color for age and race; warm; no rash NEURO: Moves all extremities equally, sensation to light touch intact diffusely, cranial nerves II through XII intact. Strength 5/5 in all 4 extremities. No dismetria to finger to nose testing bilaterally.  PSYCH: The patient's mood and manner are appropriate. Grooming and personal hygiene are appropriate.  MEDICAL DECISION MAKING: Patient here with complaints of confusion and subjective fever. Last seen normal at 8 PM. No focal neurologic deficits on exam to suggest stroke at this time. He has a oral temperature of 99.9 and soft blood pressure. Concern for possible sepsis. Will obtain labs, cultures, urine, chest x-ray. Patient also has a VP shunt. Denies any headache, neck pain or neck stiffness at this time. We'll obtain a CT of his head, shunt series. We'll give IV fluids, broad-spectrum antibiotics. Anticipate admission.  ED PROGRESS: 1:30 AM  Pt's blood pressure has improved with IV  hydration. He is still oriented and neurologically intact but does have some repetitive questions. Chest x-ray shows possible pneumonia versus edema. Patient's BNP is 102.5. I suspect this is more likely pneumonia. Family does report of the past several days he has been coughing. He has a leukocytosis of 17,000 with left shift. Shunt series, head CT showed no acute abnormality. Shunt appears intact and no hydrocephalus visualized. He is not having headache, neck pain or neck stiffness or focal neurologic deficits. I doubt that he has a shunt infection. Urine shows trace blood but no other sign of infection. I suspect that pneumonia is a source of his infection today. He does not have any hypoxia. Family would [facility] PCP is with [place].   1:50 AM  Discussed patient's case with hospitalist, Dr. Tamala Julian.  Recommend admission to inpatient, telemetry bed.  I will place holding orders per their request. Patient and family (if present) updated with plan. Care transferred to hospitalist service upon arrival to [facility].  I reviewed all nursing notes, vitals, pertinent old records, EKGs, labs, imaging (as available).    Sepsis - Repeat Assessment  Performed at:    1:29 AM  Vitals     Blood pressure 118/57, pulse 66, temperature 100.2 F (37.9 C), temperature source Rectal, resp. rate 20, height 5' 8"  (1.727 m), weight 256 lb (116.121 kg), SpO2 96 %.  Heart:     Regular rate and rhythm  Lungs:    CTA  Capillary Refill:   <2 sec  Peripheral Pulse:   Radial pulse palpable  Skin:     Normal Color      EKG Interpretation  Date/Time:  Monday January 04 2016 23:20:56 EDT Ventricular Rate:  67 PR Interval:    QRS Duration: 96 QT Interval:  527 QTC Calculation: 557 R Axis:   24 Text Interpretation:  Sinus rhythm Prolonged PR interval Abnormal R-wave progression, early transition Prolonged QT interval Baseline wander in lead(s) II III aVL aVF V2 No significant change since  last tracing Confirmed by Makell Drohan,  DO, Raya Mckinstry ([id number]) on 01/05/2016 12:22:00 AM        CRITICAL CARE Performed by: Nyra Jabs   Total critical care time: 45 minutes  Critical care time was exclusive of separately billable procedures and treating other patients.  Critical care was necessary to treat or prevent imminent or life-threatening deterioration.  Critical care was time spent personally by me on the following activities: development of treatment plan with patient and/or surrogate as well as nursing, discussions with consultants, evaluation of patient's response to treatment, examination of patient, obtaining history from patient or surrogate, ordering and performing treatments and interventions, ordering and review of laboratory studies, ordering and review of radiographic studies, pulse oximetry and re-evaluation of patient's condition.   I personally performed the services described in this documentation, which was scribed in my presence. The recorded information has been reviewed and is accurate.   [place], DO 01/05/16 [phone]

## 2016-01-05 ENCOUNTER — Inpatient Hospital Stay (HOSPITAL_COMMUNITY): Payer: Medicare Other

## 2016-01-05 ENCOUNTER — Encounter (HOSPITAL_COMMUNITY): Payer: Self-pay | Admitting: Family Medicine

## 2016-01-05 ENCOUNTER — Emergency Department (HOSPITAL_BASED_OUTPATIENT_CLINIC_OR_DEPARTMENT_OTHER): Payer: Medicare Other

## 2016-01-05 DIAGNOSIS — K810 Acute cholecystitis: Secondary | ICD-10-CM | POA: Diagnosis not present

## 2016-01-05 DIAGNOSIS — R4182 Altered mental status, unspecified: Secondary | ICD-10-CM | POA: Diagnosis not present

## 2016-01-05 DIAGNOSIS — M069 Rheumatoid arthritis, unspecified: Secondary | ICD-10-CM | POA: Diagnosis not present

## 2016-01-05 DIAGNOSIS — G9341 Metabolic encephalopathy: Secondary | ICD-10-CM | POA: Diagnosis present

## 2016-01-05 DIAGNOSIS — R7989 Other specified abnormal findings of blood chemistry: Secondary | ICD-10-CM

## 2016-01-05 DIAGNOSIS — Z7951 Long term (current) use of inhaled steroids: Secondary | ICD-10-CM | POA: Diagnosis not present

## 2016-01-05 DIAGNOSIS — N39498 Other specified urinary incontinence: Secondary | ICD-10-CM | POA: Diagnosis present

## 2016-01-05 DIAGNOSIS — E871 Hypo-osmolality and hyponatremia: Secondary | ICD-10-CM | POA: Diagnosis present

## 2016-01-05 DIAGNOSIS — E785 Hyperlipidemia, unspecified: Secondary | ICD-10-CM | POA: Diagnosis not present

## 2016-01-05 DIAGNOSIS — K8001 Calculus of gallbladder with acute cholecystitis with obstruction: Secondary | ICD-10-CM | POA: Diagnosis not present

## 2016-01-05 DIAGNOSIS — J189 Pneumonia, unspecified organism: Secondary | ICD-10-CM

## 2016-01-05 DIAGNOSIS — I10 Essential (primary) hypertension: Secondary | ICD-10-CM | POA: Diagnosis not present

## 2016-01-05 DIAGNOSIS — I4581 Long QT syndrome: Secondary | ICD-10-CM | POA: Diagnosis present

## 2016-01-05 DIAGNOSIS — R6520 Severe sepsis without septic shock: Secondary | ICD-10-CM | POA: Diagnosis present

## 2016-01-05 DIAGNOSIS — G4733 Obstructive sleep apnea (adult) (pediatric): Secondary | ICD-10-CM | POA: Diagnosis present

## 2016-01-05 DIAGNOSIS — Z8673 Personal history of transient ischemic attack (TIA), and cerebral infarction without residual deficits: Secondary | ICD-10-CM | POA: Diagnosis not present

## 2016-01-05 DIAGNOSIS — Z982 Presence of cerebrospinal fluid drainage device: Secondary | ICD-10-CM | POA: Diagnosis not present

## 2016-01-05 DIAGNOSIS — Z6838 Body mass index (BMI) 38.0-38.9, adult: Secondary | ICD-10-CM | POA: Diagnosis not present

## 2016-01-05 DIAGNOSIS — I959 Hypotension, unspecified: Secondary | ICD-10-CM

## 2016-01-05 DIAGNOSIS — E860 Dehydration: Secondary | ICD-10-CM | POA: Diagnosis present

## 2016-01-05 DIAGNOSIS — R1011 Right upper quadrant pain: Secondary | ICD-10-CM | POA: Diagnosis not present

## 2016-01-05 DIAGNOSIS — A419 Sepsis, unspecified organism: Secondary | ICD-10-CM | POA: Diagnosis not present

## 2016-01-05 DIAGNOSIS — R410 Disorientation, unspecified: Secondary | ICD-10-CM | POA: Diagnosis not present

## 2016-01-05 DIAGNOSIS — R748 Abnormal levels of other serum enzymes: Secondary | ICD-10-CM | POA: Diagnosis present

## 2016-01-05 DIAGNOSIS — Z87891 Personal history of nicotine dependence: Secondary | ICD-10-CM | POA: Diagnosis not present

## 2016-01-05 DIAGNOSIS — R079 Chest pain, unspecified: Secondary | ICD-10-CM | POA: Diagnosis not present

## 2016-01-05 DIAGNOSIS — K589 Irritable bowel syndrome without diarrhea: Secondary | ICD-10-CM | POA: Diagnosis not present

## 2016-01-05 DIAGNOSIS — R1030 Lower abdominal pain, unspecified: Secondary | ICD-10-CM | POA: Diagnosis not present

## 2016-01-05 DIAGNOSIS — M353 Polymyalgia rheumatica: Secondary | ICD-10-CM

## 2016-01-05 DIAGNOSIS — I951 Orthostatic hypotension: Secondary | ICD-10-CM | POA: Insufficient documentation

## 2016-01-05 DIAGNOSIS — R918 Other nonspecific abnormal finding of lung field: Secondary | ICD-10-CM | POA: Diagnosis not present

## 2016-01-05 DIAGNOSIS — K828 Other specified diseases of gallbladder: Secondary | ICD-10-CM | POA: Diagnosis not present

## 2016-01-05 DIAGNOSIS — E876 Hypokalemia: Secondary | ICD-10-CM | POA: Diagnosis not present

## 2016-01-05 DIAGNOSIS — G40909 Epilepsy, unspecified, not intractable, without status epilepticus: Secondary | ICD-10-CM

## 2016-01-05 DIAGNOSIS — R0602 Shortness of breath: Secondary | ICD-10-CM | POA: Diagnosis not present

## 2016-01-05 DIAGNOSIS — K8000 Calculus of gallbladder with acute cholecystitis without obstruction: Secondary | ICD-10-CM | POA: Diagnosis not present

## 2016-01-05 DIAGNOSIS — F05 Delirium due to known physiological condition: Secondary | ICD-10-CM | POA: Diagnosis not present

## 2016-01-05 DIAGNOSIS — K8020 Calculus of gallbladder without cholecystitis without obstruction: Secondary | ICD-10-CM | POA: Diagnosis not present

## 2016-01-05 DIAGNOSIS — N401 Enlarged prostate with lower urinary tract symptoms: Secondary | ICD-10-CM | POA: Diagnosis present

## 2016-01-05 DIAGNOSIS — E669 Obesity, unspecified: Secondary | ICD-10-CM | POA: Diagnosis present

## 2016-01-05 DIAGNOSIS — N179 Acute kidney failure, unspecified: Secondary | ICD-10-CM | POA: Diagnosis not present

## 2016-01-05 LAB — TROPONIN I
Troponin I: 0.03 ng/mL (ref <0.03)
Troponin I: 0.03 ng/mL (ref <0.03)

## 2016-01-05 LAB — CBC
HCT: 37.8 % — ABNORMAL LOW (ref 39.0–52.0)
Hemoglobin: 12.5 g/dL — ABNORMAL LOW (ref 13.0–17.0)
MCH: 30.2 pg (ref 26.0–34.0)
MCHC: 33.1 g/dL (ref 30.0–36.0)
MCV: 91.3 fL (ref 78.0–100.0)
Platelets: 140 10*3/uL — ABNORMAL LOW (ref 150–400)
RBC: 4.14 MIL/uL — ABNORMAL LOW (ref 4.22–5.81)
RDW: 13.3 % (ref 11.5–15.5)
WBC: 14.5 10*3/uL — ABNORMAL HIGH (ref 4.0–10.5)

## 2016-01-05 LAB — COMPREHENSIVE METABOLIC PANEL
ALT: 30 U/L (ref 17–63)
AST: 23 U/L (ref 15–41)
Albumin: 3.7 g/dL (ref 3.5–5.0)
Alkaline Phosphatase: 44 U/L (ref 38–126)
Anion gap: 10 (ref 5–15)
BUN: 20 mg/dL (ref 6–20)
CO2: 25 mmol/L (ref 22–32)
Calcium: 8.5 mg/dL — ABNORMAL LOW (ref 8.9–10.3)
Chloride: 94 mmol/L — ABNORMAL LOW (ref 101–111)
Creatinine, Ser: 1.6 mg/dL — ABNORMAL HIGH (ref 0.61–1.24)
GFR calc Af Amer: 48 mL/min — ABNORMAL LOW (ref >60)
GFR calc non Af Amer: 42 mL/min — ABNORMAL LOW (ref >60)
Glucose, Bld: 132 mg/dL — ABNORMAL HIGH (ref 65–99)
Potassium: 3.4 mmol/L — ABNORMAL LOW (ref 3.5–5.1)
Sodium: 129 mmol/L — ABNORMAL LOW (ref 135–145)
Total Bilirubin: 0.7 mg/dL (ref 0.3–1.2)
Total Protein: 6.6 g/dL (ref 6.5–8.1)

## 2016-01-05 LAB — BRAIN NATRIURETIC PEPTIDE: B Natriuretic Peptide: 102.5 pg/mL — ABNORMAL HIGH (ref 0.0–100.0)

## 2016-01-05 LAB — MAGNESIUM: Magnesium: 1.7 mg/dL (ref 1.7–2.4)

## 2016-01-05 LAB — BASIC METABOLIC PANEL
Anion gap: 6 (ref 5–15)
BUN: 11 mg/dL (ref 6–20)
CO2: 24 mmol/L (ref 22–32)
Calcium: 8 mg/dL — ABNORMAL LOW (ref 8.9–10.3)
Chloride: 101 mmol/L (ref 101–111)
Creatinine, Ser: 0.97 mg/dL (ref 0.61–1.24)
GFR calc Af Amer: >60 mL/min (ref >60)
GFR calc non Af Amer: >60 mL/min (ref >60)
Glucose, Bld: 167 mg/dL — ABNORMAL HIGH (ref 65–99)
Potassium: 3.3 mmol/L — ABNORMAL LOW (ref 3.5–5.1)
Sodium: 131 mmol/L — ABNORMAL LOW (ref 135–145)

## 2016-01-05 LAB — URINE MICROSCOPIC-ADD ON: WBC, UA: NONE SEEN WBC/hpf (ref 0–5)

## 2016-01-05 LAB — URINALYSIS, ROUTINE W REFLEX MICROSCOPIC
Bilirubin Urine: NEGATIVE
Glucose, UA: NEGATIVE mg/dL
Ketones, ur: NEGATIVE mg/dL
Leukocytes, UA: NEGATIVE
Nitrite: NEGATIVE
Protein, ur: 30 mg/dL — AB
Specific Gravity, Urine: 1.018 (ref 1.005–1.030)
pH: 6 (ref 5.0–8.0)

## 2016-01-05 LAB — PROCALCITONIN: Procalcitonin: 0.16 ng/mL

## 2016-01-05 LAB — MRSA PCR SCREENING: MRSA by PCR: NEGATIVE

## 2016-01-05 LAB — I-STAT CG4 LACTIC ACID, ED: Lactic Acid, Venous: 0.38 mmol/L — ABNORMAL LOW (ref 0.5–1.9)

## 2016-01-05 LAB — STREP PNEUMONIAE URINARY ANTIGEN: Strep Pneumo Urinary Antigen: NEGATIVE

## 2016-01-05 MED ORDER — AZITHROMYCIN 500 MG PO TABS
500.0000 mg | ORAL_TABLET | ORAL | Status: AC
  Administered 2016-01-05: 500 mg via ORAL
  Filled 2016-01-05 (×2): qty 1

## 2016-01-05 MED ORDER — LACOSAMIDE 200 MG PO TABS: 200.0000 mg | ORAL_TABLET | Freq: Every day | ORAL | Status: DC

## 2016-01-05 MED ORDER — ENSURE ENLIVE PO LIQD
237.0000 mL | Freq: Two times a day (BID) | ORAL | Status: DC
  Administered 2016-01-05 – 2016-01-10 (×6): 237 mL via ORAL

## 2016-01-05 MED ORDER — LEVETIRACETAM 250 MG PO TABS
250.0000 mg | ORAL_TABLET | Freq: Two times a day (BID) | ORAL | Status: DC
  Administered 2016-01-05 – 2016-01-10 (×11): 250 mg via ORAL
  Filled 2016-01-05 (×8): qty 1

## 2016-01-05 MED ORDER — NIFEDIPINE ER OSMOTIC RELEASE 30 MG PO TB24
30.0000 mg | ORAL_TABLET | Freq: Every day | ORAL | Status: DC
  Administered 2016-01-05 – 2016-01-10 (×6): 30 mg via ORAL
  Filled 2016-01-05 (×6): qty 1

## 2016-01-05 MED ORDER — FLUTICASONE PROPIONATE 50 MCG/ACT NA SUSP
2.0000 | Freq: Every day | NASAL | Status: DC
  Administered 2016-01-05 – 2016-01-07 (×3): 2 via NASAL
  Filled 2016-01-05: qty 16

## 2016-01-05 MED ORDER — FAMOTIDINE 20 MG PO TABS
20.0000 mg | ORAL_TABLET | Freq: Every day | ORAL | Status: DC
  Administered 2016-01-05: 20 mg via ORAL
  Filled 2016-01-05: qty 1

## 2016-01-05 MED ORDER — ENOXAPARIN SODIUM 60 MG/0.6ML ~~LOC~~ SOLN
60.0000 mg | SUBCUTANEOUS | Status: DC
  Filled 2016-01-05: qty 0.6

## 2016-01-05 MED ORDER — LACOSAMIDE 100 MG PO TABS
100.0000 mg | ORAL_TABLET | Freq: Every day | ORAL | Status: DC
  Administered 2016-01-05 – 2016-01-10 (×6): 100 mg via ORAL
  Filled 2016-01-05 (×7): qty 1

## 2016-01-05 MED ORDER — KETOROLAC TROMETHAMINE 15 MG/ML IJ SOLN: 7.5000 mg | Freq: Once | INTRAMUSCULAR | Status: DC

## 2016-01-05 MED ORDER — AZITHROMYCIN 500 MG PO TABS
250.0000 mg | ORAL_TABLET | Freq: Every day | ORAL | Status: DC
  Administered 2016-01-06: 250 mg via ORAL

## 2016-01-05 MED ORDER — POTASSIUM CHLORIDE CRYS ER 20 MEQ PO TBCR
40.0000 meq | EXTENDED_RELEASE_TABLET | Freq: Once | ORAL | Status: AC
  Administered 2016-01-05: 40 meq via ORAL
  Filled 2016-01-05: qty 2

## 2016-01-05 MED ORDER — ACETAMINOPHEN 500 MG PO TABS
ORAL_TABLET | ORAL | Status: AC
  Filled 2016-01-05: qty 2

## 2016-01-05 MED ORDER — GI COCKTAIL ~~LOC~~
30.0000 mL | Freq: Once | ORAL | Status: AC
  Administered 2016-01-05: 30 mL via ORAL
  Filled 2016-01-05: qty 30

## 2016-01-05 MED ORDER — ACETAMINOPHEN 500 MG PO TABS
1000.0000 mg | ORAL_TABLET | Freq: Once | ORAL | Status: AC
  Administered 2016-01-05: 1000 mg via ORAL

## 2016-01-05 MED ORDER — SODIUM CHLORIDE 0.9 % IV BOLUS (SEPSIS)
1000.0000 mL | Freq: Once | INTRAVENOUS | Status: AC
  Administered 2016-01-05: 1000 mL via INTRAVENOUS

## 2016-01-05 MED ORDER — ESLICARBAZEPINE ACETATE 400 MG PO TABS
600.0000 mg | ORAL_TABLET | Freq: Two times a day (BID) | ORAL | Status: DC
  Administered 2016-01-05 – 2016-01-10 (×11): 600 mg via ORAL
  Filled 2016-01-05 (×9): qty 1.5

## 2016-01-05 MED ORDER — AZITHROMYCIN 500 MG PO TABS: 500.0000 mg | ORAL_TABLET | ORAL | Status: DC

## 2016-01-05 MED ORDER — ACETAMINOPHEN 650 MG RE SUPP: 650.0000 mg | Freq: Four times a day (QID) | RECTAL | Status: DC | PRN

## 2016-01-05 MED ORDER — LACOSAMIDE 100 MG PO TABS
200.0000 mg | ORAL_TABLET | Freq: Every day | ORAL | Status: DC
  Administered 2016-01-05 – 2016-01-09 (×5): 200 mg via ORAL
  Filled 2016-01-05 (×5): qty 2

## 2016-01-05 MED ORDER — ACETAMINOPHEN 325 MG PO TABS
650.0000 mg | ORAL_TABLET | Freq: Four times a day (QID) | ORAL | Status: DC | PRN
  Administered 2016-01-05 – 2016-01-07 (×2): 650 mg via ORAL
  Filled 2016-01-05 (×2): qty 2

## 2016-01-05 MED ORDER — SODIUM CHLORIDE 0.9 % IV SOLN
INTRAVENOUS | Status: AC
  Administered 2016-01-05: 04:00:00 via INTRAVENOUS

## 2016-01-05 MED ORDER — MAGNESIUM SULFATE 2 GM/50ML IV SOLN
2.0000 g | Freq: Once | INTRAVENOUS | Status: AC
Start: 2016-01-05 — End: 2016-01-05
  Administered 2016-01-05: 2 g via INTRAVENOUS
  Filled 2016-01-05: qty 50

## 2016-01-05 MED ORDER — TAMSULOSIN HCL 0.4 MG PO CAPS
0.8000 mg | ORAL_CAPSULE | Freq: Every day | ORAL | Status: DC
  Administered 2016-01-05 – 2016-01-09 (×5): 0.8 mg via ORAL
  Filled 2016-01-05 (×5): qty 2

## 2016-01-05 MED ORDER — SODIUM CHLORIDE 0.9% FLUSH
3.0000 mL | Freq: Two times a day (BID) | INTRAVENOUS | Status: DC
  Administered 2016-01-05 – 2016-01-08 (×5): 3 mL via INTRAVENOUS

## 2016-01-05 MED ORDER — FAMOTIDINE 20 MG PO TABS
20.0000 mg | ORAL_TABLET | Freq: Two times a day (BID) | ORAL | Status: DC
  Administered 2016-01-05 – 2016-01-10 (×10): 20 mg via ORAL
  Filled 2016-01-05 (×10): qty 1

## 2016-01-05 MED ORDER — DEXTROSE 5 % IV SOLN
1.0000 g | INTRAVENOUS | Status: DC
  Administered 2016-01-05 – 2016-01-08 (×4): 1 g via INTRAVENOUS
  Filled 2016-01-05 (×5): qty 10

## 2016-01-05 MED ORDER — ENSURE ENLIVE PO LIQD: 237.0000 mL | Freq: Two times a day (BID) | ORAL | Status: DC

## 2016-01-05 MED ORDER — LACOSAMIDE 50 MG PO TABS: 100.0000 mg | ORAL_TABLET | Freq: Every day | ORAL | Status: DC

## 2016-01-05 NOTE — ED Notes (Signed)
1st set of blood cultures drawn @ 2345 (01/04/16), 2nd set of blood cultures drawn at 0000 (01/05/16)

## 2016-01-05 NOTE — Progress Notes (Signed)
Picked up pt's 2030 seizure meds from pharmacy and passed off to night shift RN.

## 2016-01-05 NOTE — Progress Notes (Signed)
Pt agreeing to stay overnight. Paged provider Xu to let her know that pt has agreed to stay along with the fact that the pt showed 1st degree heartburn and had complaint of abdominal discomfort/reflux. Awaiting further orders. Pt to get a run of mag and stat EKG.

## 2016-01-05 NOTE — H&P (Signed)
History and Physical  Patient Name: Jeff Weaver     VQM:[id number]    DOB: Jan 23, 1946    DOA: 01/04/2016 PCP: [name], MD   Patient coming from: Home  Chief Complaint: Cough and confusion  HPI: Jeff Weaver is a 70 y.o. 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  The patient was in his usual state of health until last Thursday when he had a seizure (has them about every 6 months).  Then over the weekend, he had some intermittent inattentiveness, nonproductive cough and generalized malaise.  This progressed until today when he felt continued cough, some SOB, and was confused and weak and so his family brought him to the ER.  ED course: -Febrile to 100.6F, breathing 22 times per minute, hypotensive initially -Na 129, K 3.4, Cr 1.6 (baseline 0.9), WBC 16.8K, Hgb 13 -Lactate normal -UA without pyuria or hematuria -He was started on broad spectrum antibiotics for sepsis with unknown source initially -CXR showed patchy opacities, more likely pneumonia and [facility] were asked to accept in transfer -Troponin noted to be slightly elevated.     ROS: Pt complains of cough, dyspnea, confusion, fever, seizure.  Pt denies any chest pain, pressure.    All other systems negative except as just noted or noted in the history of present illness.    Past Medical History  Diagnosis Date  . Hyperlipidemia   . HTN (hypertension)   . Enteritis (regional)     Dr Olevia Perches  . Rheumatoid arthritis(714.0)     dxed in [employer] in 1980s  . Sleep apnea   . Cerebral aneurysm   . CVA (cerebral infarction)   . Seizures ([place])   . Diverticulosis   . GI bleed   . IBS (irritable bowel syndrome)   . 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 Nose surgery    . Colonoscopy w/ polypectomy  1978    negative 2009, Dr Olevia Perches. Due 2014  . Shoulder  surgery  1997  . Hernia repair    . Tonsillectomy and adenoidectomy    . Craniotomy  02/02/12    Dr Harvel Ricks, [place] of aneurysm  . Cns shunt  02/23/12  . 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 Ventriculoperitoneal shunt  02-23-12    INSERTION OF RIGHT FRONTAL VENTRICULOPERITONEAL SHUNT WITH A CODMAN HAKIM PROGRAMMABLE VALVE  . Left heart catheterization with coronary angiogram N/A 11/26/2013    Procedure: LEFT HEART CATHETERIZATION WITH CORONARY ANGIOGRAM;  Surgeon: [name], MD;  Location: [facility] CATH LAB;  Service: Cardiovascular;  Laterality: N/A;    Social History: Patient lives with his wife.  The patient walks unassisted at baseline, and walks regularly with friends.  He is a retired Designer, multimedia.  Used to smoke a pipe.  No alcohol.  No dementia or dependence for ADLs at baseline.    Allergies  Allergen Reactions  . Haloperidol Other (See Comments)    Hallucinations  . Lisinopril     Cough   . Crestor [Rosuvastatin]     Leg pain mainly in knees   . Pravastatin     Rxed by [facility] 2009; "myositis" Ok to take per pt's wife/Dr. Linna Darner  . Simvastatin     2007: nausea & vomiting ; [facility] ( he does not remember  such)    Family history: family history includes COPD in his father; Coronary artery disease in his father; Dementia (age of onset: 48) in his sister; Diabetes in his brother and sister; Hepatitis in his mother. There is no history of Colon cancer.  Prior to Admission medications   Medication Sig Start Date End Date Taking? Authorizing Provider  amoxicillin (AMOXIL) 500 MG capsule Take 2,000 mg by mouth. Prior to Dental work.   Yes Historical Provider, MD  blood glucose meter kit and supplies KIT Dispense based on patient and insurance preference. Use up to four times daily as directed. (prediabetes hypoglycemia). 10/26/15  Yes [name], MD  cetirizine (ZYRTEC) 10 MG tablet Take 1 tablet (10 mg total)  by mouth daily. 10/26/15  Yes [name], MD  Eslicarbazepine Acetate 400 MG TABS Take 600 mg by mouth 2 (two) times daily. Take 1.5 in AM and PM   Yes Historical Provider, MD  fluticasone (FLONASE) 50 MCG/ACT nasal spray Place 1 spray into both nostrils 2 (two) times daily. 05/13/14  Yes Hendricks Limes, MD  furosemide (LASIX) 20 MG tablet Take 1 tablet (20 mg total) by mouth daily. 09/14/15  Yes [name], MD  Lacosamide (VIMPAT) 100 MG TABS Take 300 mg by mouth 2 (two) times daily. 1 tablets in the am 2 in the PM   Yes Historical Provider, MD  levETIRAcetam (KEPPRA) 500 MG tablet Take 250 mg by mouth 2 (two) times daily.    Yes Historical Provider, MD  losartan (COZAAR) 50 MG tablet Take 1 tablet (50 mg total) by mouth daily. 09/14/15  Yes [name], MD  mirabegron ER (MYRBETRIQ) 25 MG TB24 tablet Take 25 mg by mouth. 01/26/15  Yes Historical Provider, MD  Multiple Vitamins-Minerals (MULTIVITAMIN) tablet Take 1 tablet by mouth daily. 10/26/15  Yes [name], MD  NIFEdipine (PROCARDIA-XL/ADALAT-CC/NIFEDICAL-XL) 30 MG 24 hr tablet Take 1 tablet (30 mg total) by mouth daily. 09/14/15  Yes [name], MD  pyridoxine (B-6) 100 MG tablet Take 100 mg by mouth daily.   Yes Historical Provider, MD  ranitidine (ZANTAC) 150 MG capsule TAKE 1 CAPSULE EVERY MORNING 10/29/15  Yes Jerene Bears, MD  Tamsulosin HCl (FLOMAX) 0.4 MG CAPS Take 0.8 mg by mouth at bedtime.    Yes Historical Provider, MD  thiamine 100 MG tablet Take 100 mg by mouth daily.   Yes Historical Provider, MD  NON FORMULARY CPAP Machine: authorized by [facility] in [employer], MD       Physical Exam: BP 142/68 mmHg  Pulse 64  Temp(Src) 98.1 F (36.7 C) (Oral)  Resp 18  Ht _0  (1.727 m)  Wt 116.121 kg (256 lb)  BMI 38.93 kg/m2  SpO2 96% General appearance: Well-developed, obese   male, alert and in no acute distress.   Eyes: Conjunctiva normal, lids and lashes normal.   PERRL.  ENT: No nasal deformity,  discharge.  OP moist without lesions.   Skin: Warm and dry.  No suspicious rashes or lesions. Cardiac: RRR, nl S1-S2, no murmurs appreciated.  Capillary refill is brisk.  JVP not visible.  No LE edema.  Radial and DP pulses 2+ and symmetric. Respiratory: Normal respiratory rate and rhythm.  Soft crackles bilaterally. GI: Abdomen soft without rigidity.  No TTP. No ascites, distension, hepatosplenomegaly.   MSK: No deformities or effusions.  No clubbing/cyanosis. Neuro: Cranial nerves normal.  Oriented to person, place and time.  Gait normal.  Sensorium intact and responding to questions, attention normal.  Speech is fluent.  Moves all extremities equally and with normal coordination.    Psych: Affect normal.  Judgment and insight appear normal.       Labs on Admission:  I have personally reviewed following labs and imaging studies: CBC:  Recent Labs Lab 01/04/16 2320  WBC 16.8*  NEUTROABS 13.9*  HGB 13.4  HCT 38.4*  MCV 90.8  PLT 323   Basic Metabolic Panel:  Recent Labs Lab 01/04/16 2320  NA 129*  K 3.4*  CL 94*  CO2 25  GLUCOSE 132*  BUN 20  CREATININE 1.60*  CALCIUM 8.5*   GFR: Estimated Creatinine Clearance: 52.4 mL/min (by C-G formula based on Cr of 1.6).  Liver Function Tests:  Recent Labs Lab 01/04/16 2320  AST 23  ALT 30  ALKPHOS 44  BILITOT 0.7  PROT 6.6  ALBUMIN 3.7   Cardiac Enzymes:  Recent Labs Lab 01/05/16 0020  TROPONINI 0.03*   Sepsis Labs: Lactate normal        Radiological Exams on Admission: Personally reviewed: Dg Neck Soft Tissue  01/05/2016  CLINICAL DATA:  Acute onset of altered mental status and fever. Assess ventriculoperitoneal shunt. Initial encounter. EXAM: NECK SOFT TISSUES - 1+ VIEW COMPARISON:  CTA of the neck performed 09/03/2014 FINDINGS: The patient's ventriculoperitoneal shunt is grossly unremarkable in appearance. The visualized portions of the lung demonstrate vascular congestion. No acute osseous abnormalities are  seen. IMPRESSION: Ventriculoperitoneal shunt is grossly unremarkable in appearance. Electronically Signed   By: Garald Balding M.D.   On: 01/05/2016 00:52   Dg Chest 2 View  01/05/2016  CLINICAL DATA:  Acute onset of altered mental status and fever. Assess ventriculoperitoneal shunt. EXAM: CHEST  2 VIEW COMPARISON:  Chest radiograph performed 12/06/2014 FINDINGS: The lungs are well-aerated. Vascular congestion is noted. Bibasilar airspace opacities may reflect mild interstitial edema or possibly infection. There is no evidence of pleural effusion or pneumothorax. The heart is mildly enlarged. No acute osseous abnormalities are seen. The patient's ventriculoperitoneal shunt is grossly unremarkable in appearance. IMPRESSION: 1. Ventriculoperitoneal shunt is grossly unremarkable in appearance. 2. Vascular congestion and mild cardiomegaly. Bibasilar airspace opacities may reflect mild interstitial edema or possibly infection. Electronically Signed   By: Garald Balding M.D.   On: 01/05/2016 00:50   Dg Abd 1 View  01/05/2016  CLINICAL DATA:  Acute onset of lower abdominal pain. Assess ventriculoperitoneal shunt. Initial encounter. EXAM: ABDOMEN - 1 VIEW COMPARISON:  None. FINDINGS: The visualized bowel gas pattern is unremarkable. Scattered air and stool filled loops of colon are seen; no abnormal dilatation of small bowel loops is seen to suggest small bowel obstruction. No free intra-abdominal air is identified, though evaluation for free air is limited on a single supine view. The visualized osseous structures are within normal limits; the sacroiliac joints are unremarkable in appearance. The ventriculoperitoneal shunt is grossly unremarkable in appearance. IMPRESSION: 1. Visualized ventriculoperitoneal shunt is grossly unremarkable. 2. Unremarkable bowel gas pattern; no free intra-abdominal air seen. Small amount of stool noted in the colon. Electronically Signed   By: Garald Balding M.D.   On: 01/05/2016 00:51    Ct Head Wo Contrast  01/05/2016  CLINICAL DATA:  Acute onset of altered mental status and fever. Initial encounter. EXAM: CT HEAD WITHOUT CONTRAST TECHNIQUE: Contiguous axial images were obtained from the base of the skull through the vertex without intravenous contrast. COMPARISON:  CT of the head performed 12/05/2014 FINDINGS: There is no evidence  of acute infarction, mass lesion, or intra- or extra-axial hemorrhage on CT. Prominence of the ventricles and sulci reflects mild cortical volume loss. There is no evidence of hydrocephalus. Mild cerebellar atrophy is noted. Chronic encephalomalacia is noted at the left frontal lobe. A left frontal ventriculoperitoneal shunt appears grossly intact. An aneurysm clip is noted at the left side of the circle of Willis. The brainstem and fourth ventricle are within normal limits. The basal ganglia are unremarkable in appearance. No mass effect or midline shift is seen. Postoperative change is noted along the left frontoparietal calvarium. The orbits are within normal limits. The paranasal sinuses and mastoid air cells are well-aerated. No significant soft tissue abnormalities are seen. IMPRESSION: 1. No acute intracranial pathology seen on CT. 2. No evidence of hydrocephalus.  Mild cortical volume loss noted. 3. Chronic encephalomalacia at the left frontal lobe. 4. Left frontal ventriculoperitoneal shunt appears grossly intact. Electronically Signed   By: Garald Balding M.D.   On: 01/05/2016 00:59    EKG: Independently reviewed. Rate 67, no ST segment changes, QTc prolonged per automatic read, appears normal when measured.    Assessment/Plan  1. Sepsis from CAP:  Suspected source pneumonia. Organism unknown. Patient meets criteria given tachypnea, leukocytosis, and evidence of organ dysfunction (AMS, AKI, trop elevation, and hypotension).  Lactate normal.  This patient is at high risk of poor outcomes with a SOFA score of 2 (at least 2 of the following clinical  criteria: respiratory rate of 22/min or greater, altered mentation, or systolic blood pressure of 100 mm Hg or less).  Antibiotics delivered in the ED.    -Sepsis bundle utilized:  -Blood and urine cultures drawn  -30 ml/kg bolus given in ED  -Start targeted antibiotics with ceftriaxone and azithromycin, based on suspected source of infection    -Repeat renal function and complete blood count in AM  -Code SEPSIS called to E-link  -Check urine pneumonia antigens, sputum culture if able      2. AKI:  UA bland.  Recent hypotension and use of ARB and furosemide, suspect pre-renal injury. -Hold ARB and furosemide and fluids -Repeat BMP tomorrow  3. Elevated troponin:  -Cycle enzymes  4. Hx of PMR:   5. OSA on CPAP:  -Continue home CPAP  6. HTN:  Hypotensive at admission. -Hold home losartan, furosemide, and nifedipine  7. Hyponatremia:  Likely hypovolemic.  Repeat after fluids  8. Hypokalemia: Mild -Repeat BMP   9. Seizures: -Continue home Vimpat -Continue home supply of Eslicarbazepine and Keppra  10. Bladder incontinence and BPH: -Hold mirabegron while in hospital -Continue tamsulosin     DVT prophylaxis: Lovenox  Code Status: FULL  Family Communication: Wife and daughter at bedside  Disposition Plan: Anticipate continued antibiotics, trend troponin and creatinine. Consults called: None Admission status: INPATIENT, tele   Medical decision making: Patient seen at 6:00 AM on 01/05/2016. What exists of the patient's chart and outside records in [place] was reviewed in depth.  Clinical condition: improving, hemodynamically stable at arrival.        Edwin Dada [facility] Pager [phone]

## 2016-01-05 NOTE — Progress Notes (Signed)
Received information regarding patient from Dr. Leonides Schanz at [facility]  Jeff Weaver is a 70 year old male with a past medical history of cerebral aneurysm s/p clipping, s/p VP shunt, seizure disorder; who presents with AMS and cough. Rectal temp 100.14F, initial BP 93/52. Chest x-ray showed pulmonary edema versus infection. WBC 16.8 w/ left shift. UA neg. No focal neurologic deficits and CT of brain shows no acute abnormalities.. Thought likely to be pneumonia less likely CHF with BNP 102.5. Given vancomycin and Zosyn in the ED. Patient vitals otherwise stable at this time transfer to a telemetry bed.

## 2016-01-05 NOTE — Progress Notes (Addendum)
PROGRESS NOTE  Jeff Weaver KMM:[id number] DOB: 06/07/1946 DOA: 01/04/2016 PCP: [name], MD  HPI/Recap of past 24 hours:  t max 100.2 around midnight, no fever this am, Feeling better, wife and daughter in room  Assessment/Plan: Principal Problem:   Sepsis ([place]) Active Problems:   Essential hypertension   OSA (obstructive sleep apnea)   Seizure disorder (HCC)   Elevated troponin   PMR (polymyalgia rheumatica) (HCC)   Hyponatremia   Hypokalemia   CAP (community acquired pneumonia)   Sepsis from CAP with metabolic encephalopathy, family reports he is more confused than usual: improving on 7/5 Suspected source pneumonia. Organism unknown. Patient meets criteria given tachypnea, leukocytosis, and evidence of organ dysfunction (AMS, AKI, trop elevation, and hypotension). Lactate normal. This patient is at high risk of poor outcomes with a SOFA score of 2 (at least 2 of the following clinical criteria: respiratory rate of 22/min or greater, altered mentation, or systolic blood pressure of 100 mm Hg or less). Antibiotics delivered in the ED.   -Sepsis bundle utilized: -Blood and urine cultures drawn, urine strep pneumonia antigens, legionella antigen pending collection, sputum culture if able -cxr + bibasilar opacities, clinically dehydrated, consistent with pna,               --vanc/zosyn x1 and 30 ml/kg bolus given in ED -admitting md started ceftriaxone and azithromycin which is continued     2. AKI:  UA bland. Recent hypotension and use of ARB and furosemide, suspect pre-renal injury. -Hold ARB and furosemide and fluids -cr 1.6 on admission, normalized.  3. Elevated troponin:  -troponin flat, echo pending, ekg no acute st/t changes, does has first degree av block, QTc prolongation(5531m) on presentation has resolved on repeat ekg (37331m.  4. HTN:  Hypotensive at admission. -Hold home losartan, furosemide,  and nifedipine -restart nifedipine on 7/4, continue hold losartan, furosemide, likely able  To restart home meds at discharge.  5. Hyponatremia:  Likely hypovolemic. improving with fluids, continue hold lasix.  6. Hypokalemia: Replaced, mag 1.7 s/p 2g iv mag as well  -Repeat k/mag in am   7. Seizures: -Continue home Vimpat -Continue home supply of Eslicarbazepine and Keppra -reported patient has been having seizures about once every 31m67monthlast this past Thursday.  -need outpatient neurology follow up  8. H/o cerebral aneurysm, s/p clips , h/o VP shunt on the left, stable at baseline.   9. Bladder incontinence and BPH: -Hold mirabegron while in hospital -Continue tamsulosin  10. OSA on CPAP:  -Continue home CPAP  Body mass index is 38.93 kg/(m^2).   Addendum: 6:40pm RN paged to report patient c/o 5/10 upper abdominal pain, with h/o gallstone, lft  wnl this am, will get CT ab/pel, repeat lft/lipase. Iv toradol 7.5mg61m  DVT prophylaxis: Lovenox  Code Status: FULL  Family Communication: Wife and daughter at bedside  Disposition Plan: Anticipate d/c in am Consults called: None   Procedures:  none  Antibiotics:  Vanc/zosynx1  on the ED   Rocephin/zithro since admitted    Objective: BP 124/61 mmHg  Pulse 67  Temp(Src) 98.1 F (36.7 C) (Oral)  Resp 18  Ht 5' 8"  (1.727 m)  Wt 116.121 kg (256 lb)  BMI 38.93 kg/m2  SpO2 93%  Intake/Output Summary (Last 24 hours) at 01/05/16 1606 Last data filed at 01/05/16 0219  Gross per 24 hour  Intake   3250 ml  Output      0 ml  Net   3250 ml   File[employer]  01/04/16 2306  Weight: 116.121 kg (256 lb)    Exam:   General:  NAD  Cardiovascular: RRR  Respiratory: CTABL  Abdomen: Soft/ND/NT, positive BS  Musculoskeletal: No Edema  Neuro: aaox3  Data Reviewed: Basic Metabolic Panel:  Recent Labs Lab 01/04/16 2320 01/05/16 0936  NA 129* 131*  K 3.4* 3.3*  CL 94* 101  CO2 25 24  GLUCOSE 132*  167*  BUN 20 11  CREATININE 1.60* 0.97  CALCIUM 8.5* 8.0*  MG  --  1.7   Liver Function Tests:  Recent Labs Lab 01/04/16 2320  AST 23  ALT 30  ALKPHOS 44  BILITOT 0.7  PROT 6.6  ALBUMIN 3.7   No results for input(s): LIPASE, AMYLASE in the last 168 hours. No results for input(s): AMMONIA in the last 168 hours. CBC:  Recent Labs Lab 01/04/16 2320 01/05/16 0936  WBC 16.8* 14.5*  NEUTROABS 13.9*  --   HGB 13.4 12.5*  HCT 38.4* 37.8*  MCV 90.8 91.3  PLT 164 140*   Cardiac Enzymes:    Recent Labs Lab 01/05/16 0020 01/05/16 1335  TROPONINI 0.03* 0.03*   BNP (last 3 results)  Recent Labs  01/05/16  BNP 102.5*    ProBNP (last 3 results) No results for input(s): PROBNP in the last 8760 hours.  CBG: No results for input(s): GLUCAP in the last 168 hours.  No results found for this or any previous visit (from the past 240 hour(s)).   Studies: Dg Neck Soft Tissue  01/05/2016  CLINICAL DATA:  Acute onset of altered mental status and fever. Assess ventriculoperitoneal shunt. Initial encounter. EXAM: NECK SOFT TISSUES - 1+ VIEW COMPARISON:  CTA of the neck performed 09/03/2014 FINDINGS: The patient's ventriculoperitoneal shunt is grossly unremarkable in appearance. The visualized portions of the lung demonstrate vascular congestion. No acute osseous abnormalities are seen. IMPRESSION: Ventriculoperitoneal shunt is grossly unremarkable in appearance. Electronically Signed   By: Garald Balding M.D.   On: 01/05/2016 00:52   Dg Chest 2 View  01/05/2016  CLINICAL DATA:  Acute onset of altered mental status and fever. Assess ventriculoperitoneal shunt. EXAM: CHEST  2 VIEW COMPARISON:  Chest radiograph performed 12/06/2014 FINDINGS: The lungs are well-aerated. Vascular congestion is noted. Bibasilar airspace opacities may reflect mild interstitial edema or possibly infection. There is no evidence of pleural effusion or pneumothorax. The heart is mildly enlarged. No acute osseous  abnormalities are seen. The patient's ventriculoperitoneal shunt is grossly unremarkable in appearance. IMPRESSION: 1. Ventriculoperitoneal shunt is grossly unremarkable in appearance. 2. Vascular congestion and mild cardiomegaly. Bibasilar airspace opacities may reflect mild interstitial edema or possibly infection. Electronically Signed   By: Garald Balding M.D.   On: 01/05/2016 00:50   Dg Abd 1 View  01/05/2016  CLINICAL DATA:  Acute onset of lower abdominal pain. Assess ventriculoperitoneal shunt. Initial encounter. EXAM: ABDOMEN - 1 VIEW COMPARISON:  None. FINDINGS: The visualized bowel gas pattern is unremarkable. Scattered air and stool filled loops of colon are seen; no abnormal dilatation of small bowel loops is seen to suggest small bowel obstruction. No free intra-abdominal air is identified, though evaluation for free air is limited on a single supine view. The visualized osseous structures are within normal limits; the sacroiliac joints are unremarkable in appearance. The ventriculoperitoneal shunt is grossly unremarkable in appearance. IMPRESSION: 1. Visualized ventriculoperitoneal shunt is grossly unremarkable. 2. Unremarkable bowel gas pattern; no free intra-abdominal air seen. Small amount of stool noted in the colon. Electronically Signed   By: Jacqulynn Cadet  Chang M.D.   On: 01/05/2016 00:51   Ct Head Wo Contrast  01/05/2016  CLINICAL DATA:  Acute onset of altered mental status and fever. Initial encounter. EXAM: CT HEAD WITHOUT CONTRAST TECHNIQUE: Contiguous axial images were obtained from the base of the skull through the vertex without intravenous contrast. COMPARISON:  CT of the head performed 12/05/2014 FINDINGS: There is no evidence of acute infarction, mass lesion, or intra- or extra-axial hemorrhage on CT. Prominence of the ventricles and sulci reflects mild cortical volume loss. There is no evidence of hydrocephalus. Mild cerebellar atrophy is noted. Chronic encephalomalacia is noted at the  left frontal lobe. A left frontal ventriculoperitoneal shunt appears grossly intact. An aneurysm clip is noted at the left side of the circle of Willis. The brainstem and fourth ventricle are within normal limits. The basal ganglia are unremarkable in appearance. No mass effect or midline shift is seen. Postoperative change is noted along the left frontoparietal calvarium. The orbits are within normal limits. The paranasal sinuses and mastoid air cells are well-aerated. No significant soft tissue abnormalities are seen. IMPRESSION: 1. No acute intracranial pathology seen on CT. 2. No evidence of hydrocephalus.  Mild cortical volume loss noted. 3. Chronic encephalomalacia at the left frontal lobe. 4. Left frontal ventriculoperitoneal shunt appears grossly intact. Electronically Signed   By: Garald Balding M.D.   On: 01/05/2016 00:59    Scheduled Meds: . [START ON 01/06/2016] azithromycin  250 mg Oral Q0600  . cefTRIAXone (ROCEPHIN)  IV  1 g Intravenous Q24H  . Eslicarbazepine Acetate  600 mg Oral Q12H  . famotidine  20 mg Oral BID  . fluticasone  2 spray Each Nare Daily  . Lacosamide  100 mg Oral Daily  . Lacosamide  200 mg Oral Daily  . levETIRAcetam  250 mg Oral Q12H  . magnesium sulfate 1 - 4 g bolus IVPB  2 g Intravenous Once  . NIFEdipine  30 mg Oral Daily  . sodium chloride flush  3 mL Intravenous Q12H  . tamsulosin  0.8 mg Oral QHS    Continuous Infusions:    Time spent: 64mns  From 9:25am to 1Marisue BrooklynMD, PhD  [facility] Pager [phone] If 7PM-7AM, please contact night-coverage at [email or web address], password TSt. [facility]7/10/2015, 4:06 PM  LOS: 0 days

## 2016-01-05 NOTE — ED Notes (Signed)
[facility] here for transport at this time.

## 2016-01-05 NOTE — Progress Notes (Signed)
Paged provider Erlinda Hong about pt having a positive triponin of 0.03 and EKG showing NSR with 1st degree heartblock. No further orders given. Will continue to monitor pt.

## 2016-01-05 NOTE — ED Notes (Signed)
MD at bedside. 

## 2016-01-05 NOTE — Progress Notes (Signed)
Pharmacy Antibiotic Note  Jeff Weaver is a 70 y.o. male admitted on 01/04/2016 with CAP.  Pharmacy has been consulted for Azithromycin po and Rocephin dosing.  Given Vanc and Zosyn in ED  Plan: Rocephin 1gm IV q24h x 7 days Azithromycin 560m po now then 2572mdaily for 4 days Azithromycin 50086mo now then 250m50mily for 4 days Pharmacy will sign off - please reconsult if needed  Height: 5' 8"  (172.7 cm) Weight: 256 lb (116.121 kg) IBW/kg (Calculated) : 68.4  Temp (24hrs), Avg:99.1 F (37.3 C), Min:98 F (36.7 C), Max:100.2 F (37.9 C)   Recent Labs Lab 01/04/16 2320 01/04/16 2358 01/05/16 0253  WBC 16.8*  --   --   CREATININE 1.60*  --   --   LATICACIDVEN  --  0.87 0.38*    Estimated Creatinine Clearance: 52.4 mL/min (by C-G formula based on Cr of 1.6).    Allergies  Allergen Reactions  . Haloperidol Other (See Comments)    Hallucinations  . Lisinopril     Cough   . Crestor [Rosuvastatin]     Leg pain mainly in knees   . Pravastatin     Rxed by [facility]; "myositis" Ok to take per pt's wife/Dr. HoppLinna DarnerSimvastatin     2007: nausea & vomiting ; [facility] ( he does not remember such)     Thank you for allowing pharmacy to be a part of this patient's care.  CaroSherlon HandingarmD, BCPS Clinical pharmacist, pager [phone] 5:38 AM

## 2016-01-05 NOTE — Progress Notes (Addendum)
Pt with complaint of abdominal pain rating 6/10 feeling like a bolt turning. Paged provider xu. Awaiting further orders. Pt to get a ct of the abdomen.

## 2016-01-05 NOTE — Progress Notes (Signed)
Pt arrived floor. MD notified.

## 2016-01-06 ENCOUNTER — Inpatient Hospital Stay (HOSPITAL_COMMUNITY): Payer: Medicare Other

## 2016-01-06 DIAGNOSIS — R079 Chest pain, unspecified: Secondary | ICD-10-CM

## 2016-01-06 DIAGNOSIS — K810 Acute cholecystitis: Secondary | ICD-10-CM

## 2016-01-06 LAB — HEPATIC FUNCTION PANEL
ALT: 30 U/L (ref 17–63)
AST: 17 U/L (ref 15–41)
Albumin: 2.9 g/dL — ABNORMAL LOW (ref 3.5–5.0)
Alkaline Phosphatase: 51 U/L (ref 38–126)
Bilirubin, Direct: 0.2 mg/dL (ref 0.1–0.5)
Indirect Bilirubin: 0.2 mg/dL — ABNORMAL LOW (ref 0.3–0.9)
Total Bilirubin: 0.4 mg/dL (ref 0.3–1.2)
Total Protein: 6.1 g/dL — ABNORMAL LOW (ref 6.5–8.1)

## 2016-01-06 LAB — ECHOCARDIOGRAM COMPLETE
E decel time: 232 msec
E/e' ratio: 10.18
FS: 42 % (ref 28–44)
Height: 68 in
IVS/LV PW RATIO, ED: 0.99
LA ID, A-P, ES: 44 mm
LA diam end sys: 44 mm
LA diam index: 1.94 cm/m2
LA vol A4C: 49.7 ml
LA vol index: 25.4 mL/m2
LA vol: 57.7 mL
LV E/e' medial: 10.18
LV E/e'average: 10.18
LV PW d: 12.8 mm — AB (ref 0.6–1.1)
LV e' LATERAL: 10.9 cm/s
LVOT area: 3.8 cm2
LVOT diameter: 22 mm
MV Dec: 232
MV Peak grad: 5 mmHg
MV pk A vel: 90.7 m/s
MV pk E vel: 111 m/s
TAPSE: 34.9 mm
TDI e' lateral: 10.9
TDI e' medial: 6.42
Weight: 4096 oz

## 2016-01-06 LAB — CBC
HCT: 38.6 % — ABNORMAL LOW (ref 39.0–52.0)
Hemoglobin: 12.8 g/dL — ABNORMAL LOW (ref 13.0–17.0)
MCH: 30.8 pg (ref 26.0–34.0)
MCHC: 33.2 g/dL (ref 30.0–36.0)
MCV: 92.8 fL (ref 78.0–100.0)
Platelets: 166 10*3/uL (ref 150–400)
RBC: 4.16 MIL/uL — ABNORMAL LOW (ref 4.22–5.81)
RDW: 13.4 % (ref 11.5–15.5)
WBC: 15.3 10*3/uL — ABNORMAL HIGH (ref 4.0–10.5)

## 2016-01-06 LAB — BASIC METABOLIC PANEL
Anion gap: 7 (ref 5–15)
BUN: 7 mg/dL (ref 6–20)
CO2: 28 mmol/L (ref 22–32)
Calcium: 8.5 mg/dL — ABNORMAL LOW (ref 8.9–10.3)
Chloride: 97 mmol/L — ABNORMAL LOW (ref 101–111)
Creatinine, Ser: 0.94 mg/dL (ref 0.61–1.24)
GFR calc Af Amer: >60 mL/min (ref >60)
GFR calc non Af Amer: >60 mL/min (ref >60)
Glucose, Bld: 136 mg/dL — ABNORMAL HIGH (ref 65–99)
Potassium: 4 mmol/L (ref 3.5–5.1)
Sodium: 132 mmol/L — ABNORMAL LOW (ref 135–145)

## 2016-01-06 LAB — LIPASE, BLOOD: Lipase: 18 U/L (ref 11–51)

## 2016-01-06 LAB — URINE CULTURE: Culture: NO GROWTH

## 2016-01-06 LAB — MAGNESIUM: Magnesium: 2.4 mg/dL (ref 1.7–2.4)

## 2016-01-06 MED ORDER — SODIUM CHLORIDE 0.9 % IV SOLN
INTRAVENOUS | Status: DC
  Administered 2016-01-06 – 2016-01-10 (×3): via INTRAVENOUS

## 2016-01-06 NOTE — Progress Notes (Signed)
  Echocardiogram 2D Echocardiogram has been performed.  Jeff Weaver 01/06/2016, 10:01 AM

## 2016-01-06 NOTE — Progress Notes (Signed)
PROGRESS NOTE  Jeff Weaver JIR:[id number] DOB: 02/07/1946 DOA: 01/04/2016 PCP: [name], MD  Assessment/Plan: Principal Problem:   Sepsis [facility]) Active Problems:   Essential hypertension   OSA (obstructive sleep apnea)   Seizure disorder (HCC)   Elevated troponin   PMR (polymyalgia rheumatica) (HCC)   Hyponatremia   Hypokalemia   CAP (community acquired pneumonia)  Jeff Weaver is a 70 year old gentleman with a past medical history of cerebral aneurysm, status post clips, history of VP shunt, seizure disorder, admitted to the medicine service on 01/05/2016 when he presented with complaints of mental status changes. On presentation had sepsis criteria with presence of acute kidney injury, hypotension, mental status changes, white count of 14,000. Chest x-ray revealed bibasilar opacities as source of infection suspected to be community-acquired pneumonia. He was started on ceftriaxone and azithromycin. It also complained of abdominal discomfort that was further worked up with a CT scan of abdomen and pelvis. This study revealed gallbladder wall thickening and associated gallstones concerning for acute cholecystitis. General surgery was consulted.  Sepsis  -Present on admission, evidenced by acute encephalopathy, acute kidney injury, white count of 14,000, suspect source of infection may require pneumonia versus acute cholecystitis -He was placed on sepsis protocol, treated with ceftriaxone and azithromycin -On 01/06/2016 he remains hemodynamically stable  Suspected acute cholecystitis. -Jeff Weaver reporting abdominal discomfort/right upper quadrant pain during this hospitalization for which she was further worked up with CT scan of abdomen and pelvis. Study performed on 01/05/2016 showing findings suggestive of acute cholecystitis. Common bile duct measuring 10 mm. -Lab work showing white count of 15,000 on 01/06/2016 -On physical examination he appear to have a positive Murphy  sign. -LFT's stable. -Case was discussed with general surgery will consult, keep him nothing by mouth for now provide IV fluid hydration with normal saline running at 75 mL/hour  Possible community acquire pneumonia -Initial chest x-ray showing bibasilar infiltrates -Plan to repeat 2 view chest x-ray to follow-up -Remains on ceftriaxone  AKI:  -Initial presenting with a creatinine 1.6, normalized with IV fluid resuscitation   Elevated troponin:  -troponin flat, echo pending, ekg no acute st/t changes, does has first degree av block, QTc prolongation(5326m) on presentation has resolved on repeat ekg (3755m. -Pending transthoracic echocardiogram  HTN:  Hypotensive at admission. -restart nifedipine on 7/4, continue hold losartan, furosemide, likely able  To restart home meds at discharge.   Hyponatremia:  Likely hypovolemic. improving with fluids, continue hold lasix.  Hypokalemia: Replaced, mag 1.7 s/p 2g iv mag as well   Seizures: -Continue home Vimpat -Continue home supply of Eslicarbazepine and Keppra -reported patient has been having seizures about once every 26m83monthlast this past Thursday.  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   Bladder incontinence and BPH: -Hold mirabegron while in hospital -Continue tamsulosin  Body mass index is 38.93 kg/(m^2).   DVT prophylaxis: Lovenox  Code Status: FULL  Family Communication: Wife and daughter at bedside  Disposition Plan: Anticipate d/c in am Consults called: None   Procedures:  none  Antibiotics:  Vanc/zosynx1  on the ED   Rocephin/zithro since admitted    Objective: BP 130/52 mmHg  Pulse 66  Temp(Src) 99.9 F (37.7 C) (Oral)  Resp 18  Ht 5' 8"  (1.727 m)  Wt 116.121 kg (256 lb)  BMI 38.93 kg/m2  SpO2 94% No intake or output data in the 24 hours ending 01/06/16 100[place]  01/04/16 2306   Weight: 116.121 kg (256 lb)    Exam:   General:  NAD, amber R his room no acute distress  Cardiovascular: RRR  Respiratory: CTABL  Abdomen: Soft, having mild generalized tenderness, there was pain to palpation over right upper quadrant with deep inspiration  Musculoskeletal: No Edema  Neuro: aaox3  Data Reviewed: Basic Metabolic Panel:  Recent Labs Lab 01/04/16 2320 01/05/16 0936 01/06/16 0723  NA 129* 131* 132*  K 3.4* 3.3* 4.0  CL 94* 101 97*  CO2 25 24 28   GLUCOSE 132* 167* 136*  BUN 20 11 7   CREATININE 1.60* 0.97 0.94  CALCIUM 8.5* 8.0* 8.5*  MG  --  1.7 2.4   Liver Function Tests:  Recent Labs Lab 01/04/16 2320 01/06/16 0723  AST 23 17  ALT 30 30  ALKPHOS 44 51  BILITOT 0.7 0.4  PROT 6.6 6.1*  ALBUMIN 3.7 2.9*    Recent Labs Lab 01/06/16 0723  LIPASE 18   No results for input(s): AMMONIA in the last 168 hours. CBC:  Recent Labs Lab 01/04/16 2320 01/05/16 0936 01/06/16 0723  WBC 16.8* 14.5* 15.3*  NEUTROABS 13.9*  --   --   HGB 13.4 12.5* 12.8*  HCT 38.4* 37.8* 38.6*  MCV 90.8 91.3 92.8  PLT 164 140* 166   Cardiac Enzymes:    Recent Labs Lab 01/05/16 0020 01/05/16 1335  TROPONINI 0.03* 0.03*   BNP (last 3 results)  Recent Labs  01/05/16  BNP 102.5*    ProBNP (last 3 results) No results for input(s): PROBNP in the last 8760 hours.  CBG: No results for input(s): GLUCAP in the last 168 hours.  Recent Results (from the past 240 hour(s))  Urine culture     Status: None   Collection Time: 01/05/16  1:15 AM  Result Value Ref Range Status   Specimen Description URINE, CLEAN CATCH  Final   Special Requests NONE  Final   Culture NO GROWTH Performed at [facility]   Final   Report Status 01/06/2016 FINAL  Final  MRSA PCR Screening     Status: None   Collection Time: 01/05/16  6:51 PM  Result Value Ref Range Status   MRSA by PCR NEGATIVE NEGATIVE Final    Comment:        The GeneXpert MRSA Assay (FDA approved  for NASAL specimens only), is one component of a comprehensive MRSA colonization surveillance program. It is not intended to diagnose MRSA infection nor to guide or monitor treatment for MRSA infections.      Studies: Ct Abdomen Pelvis Wo Contrast  01/05/2016  CLINICAL DATA:  Upper abdominal pain. Personal history of gallstones. EXAM: CT ABDOMEN AND PELVIS WITHOUT CONTRAST TECHNIQUE: Multidetector CT imaging of the abdomen and pelvis was performed following the standard protocol without IV contrast. COMPARISON:  Radiography same day.  Ultrasound 03/26/2014. FINDINGS: Mild scarring or atelectasis at both lung bases. The liver has a normal appearance without contrast. There is subtle evidence of gallstones. The gallbladder wall appears thick and there is some stranding in the surrounding fat. These findings suggest cholecystitis. Common duct measures up to 1 cm. Common duct stones not excluded. The spleen is normal. The pancreas is normal. The adrenal glands are normal. The kidneys are normal. Aortic atherosclerosis. The IVC is normal. No retroperitoneal mass or adenopathy. No free intraperitoneal air. Shunt tube enters the abdomen. Tip is in the pelvis. No loculated fluid collections. Bladder, prostate gland and seminal vesicles are unremarkable. Mild  lower lumbar degenerative disease. IMPRESSION: CT findings suggesting acute cholecystitis. Subtle evidence of gallstones within the gallbladder. Gallbladder wall thickening. Surrounding inflammation of the fat. Common duct is prominent at 10 mm. Common duct stones not excluded. Aortic atherosclerosis. Electronically Signed   By: Nelson Chimes M.D.   On: 01/05/2016 23:32    Scheduled Meds: . cefTRIAXone (ROCEPHIN)  IV  1 g Intravenous Q24H  . Eslicarbazepine Acetate  600 mg Oral Q12H  . famotidine  20 mg Oral BID  . feeding supplement (ENSURE ENLIVE)  237 mL Oral BID BM  . fluticasone  2 spray Each Nare Daily  . ketorolac  7.5 mg Intravenous Once  .  Lacosamide  100 mg Oral Daily  . Lacosamide  200 mg Oral Daily  . levETIRAcetam  250 mg Oral Q12H  . NIFEdipine  30 mg Oral Daily  . sodium chloride flush  3 mL Intravenous Q12H  . tamsulosin  0.8 mg Oral QHS    Continuous Infusions: . sodium chloride       Time spent: 35 mins    Kelvin Cellar MD [facility] Pager [phone]. If 7PM-7AM, please contact night-coverage at [email or web address], password [facility] 01/06/2016, 10:01 AM  LOS: 1 day

## 2016-01-06 NOTE — Progress Notes (Signed)
0830 Seizure medications picked up from pharmacy. Handed to day shift RN Larose Kells.

## 2016-01-06 NOTE — Consult Note (Signed)
[facility] Surgery Consult Note  Jeff Weaver Jul 18, 1945  [id number].    Requesting MD: Dr. Kelvin Cellar Chief Complaint/Reason for Consult: cholelithiasis   HPI:  70 y/o obese male with PMH cerebral aneurysm s/p clips, post-operative seizure disorder, and a VP shunt admitted on 01/05/16. Other pertinent medical history includes cholelithiasis, hiatal hernia (2 cm), diverticulosis and Crohn's colitis. Presenting sxs included confusion, fever, SOB, and cough. WBC 14. Patient admitted by Medicine and started on ceftriaxone and azithromycin for sepsis of unknown origin, suspected pneumonia when patchy infiltrates found on CXR. Patient later complained of abdominal pain. CT scan suggested gallstones in the gallbladder with gallbladder wall thickening and CBD 10 mm and Medicine asked [place] to see the patient consult.   Patient, daughter, and wife in the room during exam today. They report the patient struggles with some short term memory loss and some mild delays with cognitive processing. Patient reports intermittent RUQ pain, increasing in frequency and severity this week. Associated with nausea and decreased appetite. Pain is sometimes associated with meals. Currently he has pain in his epigastrium and RUQ. Denies N/V. Has been seen by GI (Dr. Olevia Perches) in the past and received a HIDA scan on 2015 significant for a low EF (21.8%) with patent cystic duct, concerning for biliary dyskinesia. He also received an U/S in 2015 significant for multiple gallstones. PMH 2 cm hiatal hernia. Cholecystectomy in 2015 was deferred to initiate a low fat diet and taper steroid use, as patient was receiving steroids for polymyalgia rheumatica. He complains of some intermittent chest pains that are worse with deep inspiration and "have been going on for years". CP does not radiate to his shoulder and is not associated with SOB or palpitations. Denies nausea vomiting. Denies melena or hematemesis. Does report non-bloody  diarrhea.   ROS: All systems reviewed and otherwise negative except for as above  Family History  Problem Relation Age of Onset  . COPD Father   . Coronary artery disease Father     MI in 52s  . Hepatitis Mother   . Diabetes Sister   . Dementia Sister 42  . Diabetes Brother   . Colon cancer Neg Hx     Past Medical History  Diagnosis Date  . Hyperlipidemia   . HTN (hypertension)   . Enteritis (regional)     Dr Olevia Perches  . Rheumatoid arthritis(714.0)     dxed in [employer] in 1980s  . Sleep apnea   . Cerebral aneurysm   . CVA (cerebral infarction)   . Seizures ([place])   . Diverticulosis   . GI bleed   . IBS (irritable bowel syndrome)   . 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 Nose surgery    . Colonoscopy w/ polypectomy  1978    negative 2009, Dr Olevia Perches. Due 2014  . Shoulder surgery  1997  . Hernia repair    . Tonsillectomy and adenoidectomy    . Craniotomy  02/02/12    Dr Harvel Ricks, [place] of aneurysm  . Cns shunt  02/23/12  . 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 Ventriculoperitoneal shunt  02-23-12    INSERTION OF RIGHT FRONTAL VENTRICULOPERITONEAL SHUNT WITH A CODMAN HAKIM PROGRAMMABLE VALVE  . Left heart catheterization with coronary angiogram N/A 11/26/2013    Procedure: LEFT HEART CATHETERIZATION WITH CORONARY ANGIOGRAM;  Surgeon: [name], MD;  Location:  [facility] CATH [facility];  Service: Cardiovascular;  Laterality: N/A;    Social History:  reports that he quit smoking about 6 years ago. He has never used smokeless tobacco. He reports that he drinks alcohol. He reports that he does not use illicit drugs.  Allergies:  Allergies  Allergen Reactions  . Haloperidol Other (See Comments)    Hallucinations  . Lisinopril     Cough   . Crestor [Rosuvastatin]     Leg pain mainly in knees   . Pravastatin     Rxed by [facility] 2009;  "myositis" Ok to take per pt's wife/Dr. Linna Darner  . Simvastatin     2007: nausea & vomiting ; [facility] ( he does not remember such)    Medications Prior to Admission  Medication Sig Dispense Refill  . amoxicillin (AMOXIL) 500 MG capsule Take 2,000 mg by mouth. Prior to Dental work.    . blood glucose meter kit and supplies KIT Dispense based on patient and insurance preference. Use up to four times daily as directed. (prediabetes hypoglycemia). 1 each 0  . cetirizine (ZYRTEC) 10 MG tablet Take 1 tablet (10 mg total) by mouth daily. 30 tablet 11  . Eslicarbazepine Acetate 400 MG TABS Take 600 mg by mouth 2 (two) times daily. Take 1.5 in AM and PM    . fluticasone (FLONASE) 50 MCG/ACT nasal spray Place 1 spray into both nostrils 2 (two) times daily. 16 g 5  . furosemide (LASIX) 20 MG tablet Take 1 tablet (20 mg total) by mouth daily. 90 tablet 3  . Lacosamide (VIMPAT) 100 MG TABS Take 300 mg by mouth 2 (two) times daily. 1 tablets in the am 2 in the PM    . levETIRAcetam (KEPPRA) 500 MG tablet Take 250 mg by mouth 2 (two) times daily.     Marland Kitchen losartan (COZAAR) 50 MG tablet Take 1 tablet (50 mg total) by mouth daily. 90 tablet 3  . Melatonin 3 MG TABS Take 3 mg by mouth at bedtime.    . mirabegron ER (MYRBETRIQ) 25 MG TB24 tablet Take 25 mg by mouth.    . Multiple Vitamins-Minerals (MULTIVITAMIN) tablet Take 1 tablet by mouth daily.    Marland Kitchen NIFEdipine (PROCARDIA-XL/ADALAT-CC/NIFEDICAL-XL) 30 MG 24 hr tablet Take 1 tablet (30 mg total) by mouth daily. 90 tablet 3  . NON FORMULARY CPAP Machine: authorized by [place] in [place]    . pyridoxine (B-6) 100 MG tablet Take 100 mg by mouth daily.    . ranitidine (ZANTAC) 150 MG capsule TAKE 1 CAPSULE EVERY MORNING 90 capsule 1  . Tamsulosin HCl (FLOMAX) 0.4 MG CAPS Take 0.8 mg by mouth at bedtime.     . thiamine 100 MG tablet Take 100 mg by mouth daily.      Blood pressure 130/52, pulse 66, temperature 99.9 F (37.7 C), temperature source Oral, resp. rate 18, height  5' 8"  (1.727 m), weight 116.121 kg (256 lb), SpO2 94 %. Physical Exam: General: pleasant, obese white male who is sitting up in bed in NAD HEENT: head is normocephalic, atraumatic.  Sclera are noninjected.  Ears and nose without any masses or lesions.  Mouth is pink and moist Heart: regular, rate, and rhythm.  No obvious murmurs, gallops, or rubs noted.  Lungs: TTP over left chest, CTAB, no wheezes, rhonchi, or rales noted.  Respiratory effort nonlabored Abd: firm, TTP epigastrium and RUQ, ND, +BS, no masses, hernias, or organomegaly MS: all 4 extremities are symmetrical with no cyanosis, clubbing, or edema. Skin:  warm and dry with no masses, lesions, or rashes Psych: A&Ox3 with an appropriate affect.  Results for orders placed or performed during the hospital encounter of 01/04/16 (from the past 48 hour(s))  Comprehensive metabolic panel     Status: Abnormal   Collection Time: 01/04/16 11:20 PM  Result Value Ref Range   Sodium 129 (L) 135 - 145 mmol/L   Potassium 3.4 (L) 3.5 - 5.1 mmol/L   Chloride 94 (L) 101 - 111 mmol/L   CO2 25 22 - 32 mmol/L   Glucose, Bld 132 (H) 65 - 99 mg/dL   BUN 20 6 - 20 mg/dL   Creatinine, Ser 1.60 (H) 0.61 - 1.24 mg/dL   Calcium 8.5 (L) 8.9 - 10.3 mg/dL   Total Protein 6.6 6.5 - 8.1 g/dL   Albumin 3.7 3.5 - 5.0 g/dL   AST 23 15 - 41 U/L   ALT 30 17 - 63 U/L   Alkaline Phosphatase 44 38 - 126 U/L   Total Bilirubin 0.7 0.3 - 1.2 mg/dL   GFR calc non Af Amer 42 (L) >60 mL/min   GFR calc Af Amer 48 (L) >60 mL/min    Comment: (NOTE) The eGFR has been calculated using the CKD EPI equation. This calculation has not been validated in all clinical situations. eGFR's persistently <60 mL/min signify possible Chronic Kidney Disease.    Anion gap 10 5 - 15  CBC WITH DIFFERENTIAL     Status: Abnormal   Collection Time: 01/04/16 11:20 PM  Result Value Ref Range   WBC 16.8 (H) 4.0 - 10.5 K/uL   RBC 4.23 4.22 - 5.81 MIL/uL   Hemoglobin 13.4 13.0 - 17.0 g/dL    HCT 38.4 (L) 39.0 - 52.0 %   MCV 90.8 78.0 - 100.0 fL   MCH 31.7 26.0 - 34.0 pg   MCHC 34.9 30.0 - 36.0 g/dL   RDW 13.4 11.5 - 15.5 %   Platelets 164 150 - 400 K/uL   Neutrophils Relative % 82 %   Neutro Abs 13.9 (H) 1.7 - 7.7 K/uL   Lymphocytes Relative 7 %   Lymphs Abs 1.2 0.7 - 4.0 K/uL   Monocytes Relative 10 %   Monocytes Absolute 1.6 (H) 0.1 - 1.0 K/uL   Eosinophils Relative 1 %   Eosinophils Absolute 0.1 0.0 - 0.7 K/uL   Basophils Relative 0 %   Basophils Absolute 0.0 0.0 - 0.1 K/uL  Procalcitonin     Status: None   Collection Time: 01/04/16 11:20 PM  Result Value Ref Range   Procalcitonin 0.16 ng/mL    Comment:        Interpretation: PCT (Procalcitonin) <= 0.5 ng/mL: Systemic infection (sepsis) is not likely. Local bacterial infection is possible. (NOTE)         ICU PCT Algorithm               Non ICU PCT Algorithm    ----------------------------     ------------------------------         PCT < 0.25 ng/mL                 PCT < 0.1 ng/mL     Stopping of antibiotics            Stopping of antibiotics       strongly encouraged.               strongly encouraged.    ----------------------------     ------------------------------  PCT level decrease by               PCT < 0.25 ng/mL       >= 80% from peak PCT       OR PCT 0.25 - 0.5 ng/mL          Stopping of antibiotics                                             encouraged.     Stopping of antibiotics           encouraged.    ----------------------------     ------------------------------       PCT level decrease by              PCT >= 0.25 ng/mL       < 80% from peak PCT        AND PCT >= 0.5 ng/mL            Continuin g antibiotics                                              encouraged.       Continuing antibiotics            encouraged.    ----------------------------     ------------------------------     PCT level increase compared          PCT > 0.5 ng/mL         with peak PCT AND          PCT >= 0.5  ng/mL             Escalation of antibiotics                                          strongly encouraged.      Escalation of antibiotics        strongly encouraged. Performed at [facility]   I-Stat CG4 Lactic Acid, ED  (not at  [facility])     Status: None   Collection Time: 01/04/16 11:58 PM  Result Value Ref Range   Lactic Acid, Venous 0.87 0.5 - 1.9 mmol/L  Brain natriuretic peptide     Status: Abnormal   Collection Time: 01/05/16 12:00 AM  Result Value Ref Range   B Natriuretic Peptide 102.5 (H) 0.0 - 100.0 pg/mL  Troponin I     Status: Abnormal   Collection Time: 01/05/16 12:20 AM  Result Value Ref Range   Troponin I 0.03 (HH) <0.03 ng/mL    Comment: CRITICAL RESULT CALLED TO, READ BACK BY AND VERIFIED WITH: Anner Crete RN AT 7517 01/05/16 BY [email or web address]   Urinalysis, Routine w reflex microscopic (not at [facility])     Status: Abnormal   Collection Time: 01/05/16  1:10 AM  Result Value Ref Range   Color, Urine YELLOW YELLOW   APPearance CLOUDY (A) CLEAR   Specific Gravity, Urine 1.018 1.005 - 1.030   pH 6.0 5.0 - 8.0   Glucose, UA NEGATIVE NEGATIVE mg/dL   Hgb urine dipstick TRACE (A) NEGATIVE   Bilirubin Urine NEGATIVE NEGATIVE   Ketones,  ur NEGATIVE NEGATIVE mg/dL   Protein, ur 30 (A) NEGATIVE mg/dL   Nitrite NEGATIVE NEGATIVE   Leukocytes, UA NEGATIVE NEGATIVE  Urine microscopic-add on     Status: Abnormal   Collection Time: 01/05/16  1:10 AM  Result Value Ref Range   Squamous Epithelial / LPF 0-5 (A) NONE SEEN   WBC, UA NONE SEEN 0 - 5 WBC/hpf   RBC / HPF 0-5 0 - 5 RBC/hpf   Bacteria, UA RARE (A) NONE SEEN  Urine culture     Status: None   Collection Time: 01/05/16  1:15 AM  Result Value Ref Range   Specimen Description URINE, CLEAN CATCH    Special Requests NONE    Culture NO GROWTH Performed at [facility]     Report Status 01/06/2016 FINAL   I-Stat CG4 Lactic Acid, ED  (not at  [facility])     Status: Abnormal   Collection Time: 01/05/16  2:53 AM  Result  Value Ref Range   Lactic Acid, Venous 0.38 (L) 0.5 - 1.9 mmol/L  CBC     Status: Abnormal   Collection Time: 01/05/16  9:36 AM  Result Value Ref Range   WBC 14.5 (H) 4.0 - 10.5 K/uL   RBC 4.14 (L) 4.22 - 5.81 MIL/uL   Hemoglobin 12.5 (L) 13.0 - 17.0 g/dL   HCT 37.8 (L) 39.0 - 52.0 %   MCV 91.3 78.0 - 100.0 fL   MCH 30.2 26.0 - 34.0 pg   MCHC 33.1 30.0 - 36.0 g/dL   RDW 13.3 11.5 - 15.5 %   Platelets 140 (L) 150 - 400 K/uL  Basic metabolic panel     Status: Abnormal   Collection Time: 01/05/16  9:36 AM  Result Value Ref Range   Sodium 131 (L) 135 - 145 mmol/L   Potassium 3.3 (L) 3.5 - 5.1 mmol/L   Chloride 101 101 - 111 mmol/L   CO2 24 22 - 32 mmol/L   Glucose, Bld 167 (H) 65 - 99 mg/dL   BUN 11 6 - 20 mg/dL   Creatinine, Ser 0.97 0.61 - 1.24 mg/dL   Calcium 8.0 (L) 8.9 - 10.3 mg/dL   GFR calc non Af Amer >60 >60 mL/min   GFR calc Af Amer >60 >60 mL/min    Comment: (NOTE) The eGFR has been calculated using the CKD EPI equation. This calculation has not been validated in all clinical situations. eGFR's persistently <60 mL/min signify possible Chronic Kidney Disease.    Anion gap 6 5 - 15  Magnesium     Status: None   Collection Time: 01/05/16  9:36 AM  Result Value Ref Range   Magnesium 1.7 1.7 - 2.4 mg/dL  Troponin I (q 6hr x 3)     Status: Abnormal   Collection Time: 01/05/16  1:35 PM  Result Value Ref Range   Troponin I 0.03 (HH) <0.03 ng/mL    Comment: CRITICAL RESULT CALLED TO, READ BACK BY AND VERIFIED WITH: T.MILAM,RN 01/05/16 1447 BY BSLADE   MRSA PCR Screening     Status: None   Collection Time: 01/05/16  6:51 PM  Result Value Ref Range   MRSA by PCR NEGATIVE NEGATIVE    Comment:        The GeneXpert MRSA Assay (FDA approved for NASAL specimens only), is one component of a comprehensive MRSA colonization surveillance program. It is not intended to diagnose MRSA infection nor to guide or monitor treatment for MRSA infections.   Strep pneumoniae urinary  antigen     Status: None   Collection Time: 01/05/16  9:52 PM  Result Value Ref Range   Strep Pneumo Urinary Antigen NEGATIVE NEGATIVE    Comment:        Infection due to S. pneumoniae cannot be absolutely ruled out since the antigen present may be below the detection limit of the test.   Basic metabolic panel     Status: Abnormal   Collection Time: 01/06/16  7:23 AM  Result Value Ref Range   Sodium 132 (L) 135 - 145 mmol/L   Potassium 4.0 3.5 - 5.1 mmol/L   Chloride 97 (L) 101 - 111 mmol/L   CO2 28 22 - 32 mmol/L   Glucose, Bld 136 (H) 65 - 99 mg/dL   BUN 7 6 - 20 mg/dL   Creatinine, Ser 0.94 0.61 - 1.24 mg/dL   Calcium 8.5 (L) 8.9 - 10.3 mg/dL   GFR calc non Af Amer >60 >60 mL/min   GFR calc Af Amer >60 >60 mL/min    Comment: (NOTE) The eGFR has been calculated using the CKD EPI equation. This calculation has not been validated in all clinical situations. eGFR's persistently <60 mL/min signify possible Chronic Kidney Disease.    Anion gap 7 5 - 15  CBC     Status: Abnormal   Collection Time: 01/06/16  7:23 AM  Result Value Ref Range   WBC 15.3 (H) 4.0 - 10.5 K/uL   RBC 4.16 (L) 4.22 - 5.81 MIL/uL   Hemoglobin 12.8 (L) 13.0 - 17.0 g/dL   HCT 38.6 (L) 39.0 - 52.0 %   MCV 92.8 78.0 - 100.0 fL   MCH 30.8 26.0 - 34.0 pg   MCHC 33.2 30.0 - 36.0 g/dL   RDW 13.4 11.5 - 15.5 %   Platelets 166 150 - 400 K/uL  Magnesium     Status: None   Collection Time: 01/06/16  7:23 AM  Result Value Ref Range   Magnesium 2.4 1.7 - 2.4 mg/dL  Hepatic function panel     Status: Abnormal   Collection Time: 01/06/16  7:23 AM  Result Value Ref Range   Total Protein 6.1 (L) 6.5 - 8.1 g/dL   Albumin 2.9 (L) 3.5 - 5.0 g/dL   AST 17 15 - 41 U/L   ALT 30 17 - 63 U/L   Alkaline Phosphatase 51 38 - 126 U/L   Total Bilirubin 0.4 0.3 - 1.2 mg/dL   Bilirubin, Direct 0.2 0.1 - 0.5 mg/dL   Indirect Bilirubin 0.2 (L) 0.3 - 0.9 mg/dL  Lipase, blood     Status: None   Collection Time: 01/06/16  7:23  AM  Result Value Ref Range   Lipase 18 11 - 51 U/L   Ct Abdomen Pelvis Wo Contrast  01/05/2016  CLINICAL DATA:  Upper abdominal pain. Personal history of gallstones. EXAM: CT ABDOMEN AND PELVIS WITHOUT CONTRAST TECHNIQUE: Multidetector CT imaging of the abdomen and pelvis was performed following the standard protocol without IV contrast. COMPARISON:  Radiography same day.  Ultrasound 03/26/2014. FINDINGS: Mild scarring or atelectasis at both lung bases. The liver has a normal appearance without contrast. There is subtle evidence of gallstones. The gallbladder wall appears thick and there is some stranding in the surrounding fat. These findings suggest cholecystitis. Common duct measures up to 1 cm. Common duct stones not excluded. The spleen is normal. The pancreas is normal. The adrenal glands are normal. The kidneys are normal. Aortic atherosclerosis. The IVC is normal. No  retroperitoneal mass or adenopathy. No free intraperitoneal air. Shunt tube enters the abdomen. Tip is in the pelvis. No loculated fluid collections. Bladder, prostate gland and seminal vesicles are unremarkable. Mild lower lumbar degenerative disease. IMPRESSION: CT findings suggesting acute cholecystitis. Subtle evidence of gallstones within the gallbladder. Gallbladder wall thickening. Surrounding inflammation of the fat. Common duct is prominent at 10 mm. Common duct stones not excluded. Aortic atherosclerosis. Electronically Signed   By: Nelson Chimes M.D.   On: 01/05/2016 23:32   Dg Neck Soft Tissue  01/05/2016  CLINICAL DATA:  Acute onset of altered mental status and fever. Assess ventriculoperitoneal shunt. Initial encounter. EXAM: NECK SOFT TISSUES - 1+ VIEW COMPARISON:  CTA of the neck performed 09/03/2014 FINDINGS: The patient's ventriculoperitoneal shunt is grossly unremarkable in appearance. The visualized portions of the lung demonstrate vascular congestion. No acute osseous abnormalities are seen. IMPRESSION:  Ventriculoperitoneal shunt is grossly unremarkable in appearance. Electronically Signed   By: Garald Balding M.D.   On: 01/05/2016 00:52   Dg Chest 2 View  01/05/2016  CLINICAL DATA:  Acute onset of altered mental status and fever. Assess ventriculoperitoneal shunt. EXAM: CHEST  2 VIEW COMPARISON:  Chest radiograph performed 12/06/2014 FINDINGS: The lungs are well-aerated. Vascular congestion is noted. Bibasilar airspace opacities may reflect mild interstitial edema or possibly infection. There is no evidence of pleural effusion or pneumothorax. The heart is mildly enlarged. No acute osseous abnormalities are seen. The patient's ventriculoperitoneal shunt is grossly unremarkable in appearance. IMPRESSION: 1. Ventriculoperitoneal shunt is grossly unremarkable in appearance. 2. Vascular congestion and mild cardiomegaly. Bibasilar airspace opacities may reflect mild interstitial edema or possibly infection. Electronically Signed   By: Garald Balding M.D.   On: 01/05/2016 00:50   Dg Abd 1 View  01/05/2016  CLINICAL DATA:  Acute onset of lower abdominal pain. Assess ventriculoperitoneal shunt. Initial encounter. EXAM: ABDOMEN - 1 VIEW COMPARISON:  None. FINDINGS: The visualized bowel gas pattern is unremarkable. Scattered air and stool filled loops of colon are seen; no abnormal dilatation of small bowel loops is seen to suggest small bowel obstruction. No free intra-abdominal air is identified, though evaluation for free air is limited on a single supine view. The visualized osseous structures are within normal limits; the sacroiliac joints are unremarkable in appearance. The ventriculoperitoneal shunt is grossly unremarkable in appearance. IMPRESSION: 1. Visualized ventriculoperitoneal shunt is grossly unremarkable. 2. Unremarkable bowel gas pattern; no free intra-abdominal air seen. Small amount of stool noted in the colon. Electronically Signed   By: Garald Balding M.D.   On: 01/05/2016 00:51   Ct Head Wo  Contrast  01/05/2016  CLINICAL DATA:  Acute onset of altered mental status and fever. Initial encounter. EXAM: CT HEAD WITHOUT CONTRAST TECHNIQUE: Contiguous axial images were obtained from the base of the skull through the vertex without intravenous contrast. COMPARISON:  CT of the head performed 12/05/2014 FINDINGS: There is no evidence of acute infarction, mass lesion, or intra- or extra-axial hemorrhage on CT. Prominence of the ventricles and sulci reflects mild cortical volume loss. There is no evidence of hydrocephalus. Mild cerebellar atrophy is noted. Chronic encephalomalacia is noted at the left frontal lobe. A left frontal ventriculoperitoneal shunt appears grossly intact. An aneurysm clip is noted at the left side of the circle of Willis. The brainstem and fourth ventricle are within normal limits. The basal ganglia are unremarkable in appearance. No mass effect or midline shift is seen. Postoperative change is noted along the left frontoparietal calvarium. The orbits are within  normal limits. The paranasal sinuses and mastoid air cells are well-aerated. No significant soft tissue abnormalities are seen. IMPRESSION: 1. No acute intracranial pathology seen on CT. 2. No evidence of hydrocephalus.  Mild cortical volume loss noted. 3. Chronic encephalomalacia at the left frontal lobe. 4. Left frontal ventriculoperitoneal shunt appears grossly intact. Electronically Signed   By: Garald Balding M.D.   On: 01/05/2016 00:59   Assessment/Plan Symptomatic cholelithiasis Biliary dyskinesia Leukocystosis - CBD dilatation without elevated LFTs. Alk phos and total bili are normal.  - nausea and TTP of RUQ with known history of gallstones. - WBC: 15.8 - RUQ U/S ordered  Possible CAP AKI Elevated troponin - no acute EKG changes; resolved; echocardiogram performed today, will follow results HTN Hyponatremia Htypokalemia - resolved  Seizures Bladder incontinence and BPH  FEN: NPO DVT Proph: SCD;s ID:  ceftriaxone day #3 Dispo: will discuss performing laparoscopic cholecystectomy tomorrow with Dr. Ninfa Linden.  Jill Alexanders, [facility] Surgery 01/06/2016, 10:24 AM Pager: [phone] Consults: [phone] Mon-Fri 7:00 am-4:30 pm Sat-Sun 7:00 am-11:30 am

## 2016-01-07 ENCOUNTER — Inpatient Hospital Stay (HOSPITAL_COMMUNITY): Payer: Medicare Other | Admitting: Certified Registered Nurse Anesthetist

## 2016-01-07 ENCOUNTER — Encounter (HOSPITAL_COMMUNITY)
Admission: EM | Disposition: A | Discharge status: Home / Self Care | Payer: Self-pay | Source: Home / Self Care | Attending: Internal Medicine

## 2016-01-07 ENCOUNTER — Encounter (HOSPITAL_COMMUNITY): Payer: Self-pay | Admitting: Anesthesiology

## 2016-01-07 DIAGNOSIS — K8001 Calculus of gallbladder with acute cholecystitis with obstruction: Secondary | ICD-10-CM

## 2016-01-07 HISTORY — PX: CHOLECYSTECTOMY: SHX55

## 2016-01-07 LAB — LEGIONELLA PNEUMOPHILA SEROGP 1 UR AG: L. pneumophila Serogp 1 Ur Ag: NEGATIVE

## 2016-01-07 SURGERY — LAPAROSCOPIC CHOLECYSTECTOMY WITH INTRAOPERATIVE CHOLANGIOGRAM
Anesthesia: General | Site: Abdomen

## 2016-01-07 MED ORDER — LIDOCAINE 2% (20 MG/ML) 5 ML SYRINGE
INTRAMUSCULAR | Status: AC
  Filled 2016-01-07: qty 5

## 2016-01-07 MED ORDER — ONDANSETRON HCL 4 MG/2ML IJ SOLN
INTRAMUSCULAR | Status: DC | PRN
  Administered 2016-01-07: 4 mg via INTRAVENOUS

## 2016-01-07 MED ORDER — HYDROMORPHONE HCL 1 MG/ML IJ SOLN
INTRAMUSCULAR | Status: AC
  Filled 2016-01-07: qty 1

## 2016-01-07 MED ORDER — ROCURONIUM BROMIDE 50 MG/5ML IV SOLN
INTRAVENOUS | Status: AC
  Filled 2016-01-07: qty 1

## 2016-01-07 MED ORDER — LACTATED RINGERS IV SOLN
INTRAVENOUS | Status: DC
  Administered 2016-01-07 (×3): via INTRAVENOUS

## 2016-01-07 MED ORDER — FENTANYL CITRATE (PF) 100 MCG/2ML IJ SOLN
INTRAMUSCULAR | Status: DC | PRN
  Administered 2016-01-07 (×3): 50 ug via INTRAVENOUS
  Administered 2016-01-07: 100 ug via INTRAVENOUS

## 2016-01-07 MED ORDER — MORPHINE SULFATE (PF) 2 MG/ML IV SOLN
1.0000 mg | INTRAVENOUS | Status: DC | PRN
  Administered 2016-01-07: 1 mg via INTRAVENOUS
  Administered 2016-01-07: 2 mg via INTRAVENOUS
  Administered 2016-01-08 (×2): 1 mg via INTRAVENOUS
  Filled 2016-01-07 (×4): qty 1

## 2016-01-07 MED ORDER — PROMETHAZINE HCL 25 MG/ML IJ SOLN: 6.2500 mg | INTRAMUSCULAR | Status: DC | PRN

## 2016-01-07 MED ORDER — CEFAZOLIN SODIUM 1 G IJ SOLR
INTRAMUSCULAR | Status: DC | PRN
  Administered 2016-01-07: 2 g via INTRAMUSCULAR

## 2016-01-07 MED ORDER — SUGAMMADEX SODIUM 500 MG/5ML IV SOLN
INTRAVENOUS | Status: AC
  Filled 2016-01-07: qty 5

## 2016-01-07 MED ORDER — ONDANSETRON HCL 4 MG/2ML IJ SOLN
INTRAMUSCULAR | Status: AC
  Filled 2016-01-07: qty 2

## 2016-01-07 MED ORDER — SODIUM CHLORIDE 0.9 % IV SOLN
INTRAVENOUS | Status: DC | PRN
  Administered 2016-01-07: 100 mL

## 2016-01-07 MED ORDER — HEMOSTATIC AGENTS (NO CHARGE) OPTIME
TOPICAL | Status: DC | PRN
  Administered 2016-01-07: 1

## 2016-01-07 MED ORDER — LOSARTAN POTASSIUM 50 MG PO TABS
50.0000 mg | ORAL_TABLET | Freq: Every day | ORAL | Status: DC
  Administered 2016-01-07 – 2016-01-10 (×4): 50 mg via ORAL
  Filled 2016-01-07 (×5): qty 1

## 2016-01-07 MED ORDER — MIDAZOLAM HCL 2 MG/2ML IJ SOLN
INTRAMUSCULAR | Status: AC
  Filled 2016-01-07: qty 2

## 2016-01-07 MED ORDER — TRAMADOL HCL 50 MG PO TABS
50.0000 mg | ORAL_TABLET | Freq: Four times a day (QID) | ORAL | Status: DC | PRN
  Administered 2016-01-08 (×2): 100 mg via ORAL
  Filled 2016-01-07 (×2): qty 2

## 2016-01-07 MED ORDER — FENTANYL CITRATE (PF) 250 MCG/5ML IJ SOLN
INTRAMUSCULAR | Status: AC
  Filled 2016-01-07: qty 5

## 2016-01-07 MED ORDER — PROPOFOL 10 MG/ML IV BOLUS
INTRAVENOUS | Status: AC
  Filled 2016-01-07: qty 20

## 2016-01-07 MED ORDER — 0.9 % SODIUM CHLORIDE (POUR BTL) OPTIME
TOPICAL | Status: DC | PRN
  Administered 2016-01-07: 1000 mL

## 2016-01-07 MED ORDER — SODIUM CHLORIDE 0.9 % IR SOLN
Status: DC | PRN
  Administered 2016-01-07 (×3): 1000 mL

## 2016-01-07 MED ORDER — BUPIVACAINE-EPINEPHRINE (PF) 0.25% -1:200000 IJ SOLN
INTRAMUSCULAR | Status: AC
  Filled 2016-01-07: qty 30

## 2016-01-07 MED ORDER — BUPIVACAINE-EPINEPHRINE 0.25% -1:200000 IJ SOLN
INTRAMUSCULAR | Status: DC | PRN
  Administered 2016-01-07: 20 mL

## 2016-01-07 MED ORDER — HYDROMORPHONE HCL 1 MG/ML IJ SOLN
0.2500 mg | INTRAMUSCULAR | Status: DC | PRN
  Administered 2016-01-07 (×3): 0.5 mg via INTRAVENOUS

## 2016-01-07 MED ORDER — PROPOFOL 10 MG/ML IV BOLUS
INTRAVENOUS | Status: DC | PRN
  Administered 2016-01-07: 200 mg via INTRAVENOUS

## 2016-01-07 MED ORDER — ROCURONIUM BROMIDE 100 MG/10ML IV SOLN
INTRAVENOUS | Status: DC | PRN
  Administered 2016-01-07: 40 mg via INTRAVENOUS
  Administered 2016-01-07 (×3): 10 mg via INTRAVENOUS

## 2016-01-07 MED ORDER — DEXAMETHASONE SODIUM PHOSPHATE 10 MG/ML IJ SOLN
INTRAMUSCULAR | Status: DC | PRN
  Administered 2016-01-07: 4 mg via INTRAVENOUS

## 2016-01-07 MED ORDER — SUGAMMADEX SODIUM 500 MG/5ML IV SOLN
INTRAVENOUS | Status: DC | PRN
  Administered 2016-01-07: 460 mg via INTRAVENOUS

## 2016-01-07 MED ORDER — LIDOCAINE 2% (20 MG/ML) 5 ML SYRINGE
INTRAMUSCULAR | Status: DC | PRN
  Administered 2016-01-07: 60 mg via INTRAVENOUS

## 2016-01-07 MED ORDER — IOPAMIDOL (ISOVUE-300) INJECTION 61%
INTRAVENOUS | Status: AC
  Filled 2016-01-07: qty 50

## 2016-01-07 MED ORDER — SODIUM CHLORIDE 0.9 % IJ SOLN
INTRAMUSCULAR | Status: AC
  Filled 2016-01-07: qty 10

## 2016-01-07 SURGICAL SUPPLY — 54 items
APPLIER CLIP 5 13 M/L LIGAMAX5 (MISCELLANEOUS) ×2
APPLIER CLIP ROT 10 11.4 M/L (STAPLE) ×2
APR CLP MED LRG 11.4X10 (STAPLE) ×1
APR CLP MED LRG 5 ANG JAW (MISCELLANEOUS) ×1
BAG SPEC RTRVL LRG 6X4 10 (ENDOMECHANICALS) ×2
BLADE SURG CLIPPER 3M 9600 (MISCELLANEOUS) IMPLANT
CANISTER SUCTION 2500CC (MISCELLANEOUS) ×2 IMPLANT
CHLORAPREP W/TINT 26ML (MISCELLANEOUS) ×2 IMPLANT
CLIP APPLIE 5 13 M/L LIGAMAX5 (MISCELLANEOUS) ×1 IMPLANT
CLIP APPLIE ROT 10 11.4 M/L (STAPLE) IMPLANT
COVER MAYO STAND STRL (DRAPES) ×2 IMPLANT
COVER SURGICAL LIGHT HANDLE (MISCELLANEOUS) ×2 IMPLANT
DRAIN CHANNEL 19F RND (DRAIN) ×1 IMPLANT
DRAPE C-ARM 42X72 X-RAY (DRAPES) ×2 IMPLANT
ELECT REM PT RETURN 9FT ADLT (ELECTROSURGICAL) ×2
ELECTRODE REM PT RTRN 9FT ADLT (ELECTROSURGICAL) ×1 IMPLANT
EVACUATOR SILICONE 100CC (DRAIN) ×1 IMPLANT
GLOVE BIO SURGEON STRL SZ8 (GLOVE) ×2 IMPLANT
GLOVE BIOGEL PI IND STRL 7.0 (GLOVE) IMPLANT
GLOVE BIOGEL PI IND STRL 7.5 (GLOVE) IMPLANT
GLOVE BIOGEL PI IND STRL 8 (GLOVE) IMPLANT
GLOVE BIOGEL PI IND STRL 8.5 (GLOVE) IMPLANT
GLOVE BIOGEL PI INDICATOR 7.0 (GLOVE) ×2
GLOVE BIOGEL PI INDICATOR 7.5 (GLOVE) ×1
GLOVE BIOGEL PI INDICATOR 8 (GLOVE) ×1
GLOVE BIOGEL PI INDICATOR 8.5 (GLOVE) ×1
GLOVE SURG SIGNA 7.5 PF LTX (GLOVE) ×2 IMPLANT
GLOVE SURG SS PI 6.5 STRL IVOR (GLOVE) ×1 IMPLANT
GLOVE SURG SS PI 7.5 STRL IVOR (GLOVE) ×1 IMPLANT
GOWN STRL REUS W/ TWL LRG LVL3 (GOWN DISPOSABLE) ×2 IMPLANT
GOWN STRL REUS W/ TWL XL LVL3 (GOWN DISPOSABLE) ×1 IMPLANT
GOWN STRL REUS W/TWL LRG LVL3 (GOWN DISPOSABLE) ×4
GOWN STRL REUS W/TWL XL LVL3 (GOWN DISPOSABLE) ×6
HEMOSTAT SNOW SURGICEL 2X4 (HEMOSTASIS) ×1 IMPLANT
KIT BASIN OR (CUSTOM PROCEDURE TRAY) ×2 IMPLANT
KIT ROOM TURNOVER OR (KITS) ×2 IMPLANT
LIQUID BAND (GAUZE/BANDAGES/DRESSINGS) ×2 IMPLANT
NS IRRIG 1000ML POUR BTL (IV SOLUTION) ×2 IMPLANT
PAD ARMBOARD 7.5X6 YLW CONV (MISCELLANEOUS) ×2 IMPLANT
POUCH SPECIMEN RETRIEVAL 10MM (ENDOMECHANICALS) ×3 IMPLANT
SCISSORS LAP 5X35 DISP (ENDOMECHANICALS) ×2 IMPLANT
SET CHOLANGIOGRAPH 5 50 .035 (SET/KITS/TRAYS/PACK) ×2 IMPLANT
SET IRRIG TUBING LAPAROSCOPIC (IRRIGATION / IRRIGATOR) ×2 IMPLANT
SLEEVE ENDOPATH XCEL 5M (ENDOMECHANICALS) ×4 IMPLANT
SPECIMEN JAR SMALL (MISCELLANEOUS) ×2 IMPLANT
SUT ETHILON 2 0 FS 18 (SUTURE) ×1 IMPLANT
SUT MON AB 4-0 PC3 18 (SUTURE) ×2 IMPLANT
TOWEL OR 17X24 6PK STRL BLUE (TOWEL DISPOSABLE) ×2 IMPLANT
TOWEL OR 17X26 10 PK STRL BLUE (TOWEL DISPOSABLE) ×2 IMPLANT
TRAY LAPAROSCOPIC MC (CUSTOM PROCEDURE TRAY) ×2 IMPLANT
TROCAR XCEL BLUNT TIP 100MML (ENDOMECHANICALS) ×2 IMPLANT
TROCAR XCEL NON-BLD 11X100MML (ENDOMECHANICALS) ×1 IMPLANT
TROCAR XCEL NON-BLD 5MMX100MML (ENDOMECHANICALS) ×2 IMPLANT
TUBING INSUFFLATION (TUBING) ×2 IMPLANT

## 2016-01-07 NOTE — Anesthesia Postprocedure Evaluation (Signed)
Anesthesia Post Note  Patient: Jeff Weaver  Procedure(s) Performed: Procedure(s) (LRB): LAPAROSCOPIC CHOLECYSTECTOMY  (N/A)  Patient location during evaluation: PACU Anesthesia Type: General Level of consciousness: awake and alert Pain management: pain level controlled Vital Signs Assessment: post-procedure vital signs reviewed and stable Respiratory status: spontaneous breathing, nonlabored ventilation, respiratory function stable and patient connected to nasal cannula oxygen Cardiovascular status: blood pressure returned to baseline and stable Postop Assessment: no signs of nausea or vomiting Anesthetic complications: no Comments: Should be on CPAP during naps and overnight    Last Vitals:  Filed Vitals:   01/07/16 1544 01/07/16 1605  BP: 136/74 141/75  Pulse:  88  Temp:  36.7 C  Resp: 16 20    Last Pain:  Filed Vitals:   01/07/16 1702  PainSc: Asleep                 Zenaida Deed

## 2016-01-07 NOTE — Transfer of Care (Signed)
Immediate Anesthesia Transfer of Care Note  Patient: Jeff Weaver  Procedure(s) Performed: Procedure(s): LAPAROSCOPIC CHOLECYSTECTOMY  (N/A)  Patient Location: PACU  Anesthesia Type:General  Level of Consciousness: awake, alert  and oriented  Airway & Oxygen Therapy: Patient Spontanous Breathing and Patient connected to face mask  Post-op Assessment: Report given to RN, Post -op Vital signs reviewed and stable and Patient moving all extremities X 4  Post vital signs: Reviewed and stable  Last Vitals:  Filed Vitals:   01/07/16 0615 01/07/16 1430  BP: 121/60   Pulse: 59   Temp: 36.8 C 36.7 C  Resp: 17     Last Pain:  Filed Vitals:   01/07/16 1449  PainSc: 0-No pain      Patients Stated Pain Goal: 0 (89/37/34 2876)  Complications: No apparent anesthesia complications

## 2016-01-07 NOTE — Progress Notes (Signed)
[facility] Progress Note     Subjective: Patient did have a run of non-sustained v.tach last night, no further cardiac workup has been performed this morning. His echocardiogram from yesterday has been performed, awaiting report. Heart cath in 2015 for CP and elevated troponin (0.03) revealed patent coronaries. This admission the patients Troponin was the same (0.03).     This morning he denies any  Fever, chills, chest pain, SOB, nausea, vomiting, or weakness. He did get diaphoretic overnight which he attributes to the room being too hot. His family is in the room and they have no further questions or concerns about his lap chole. Their major concern is staying on track with his seizure medications and being prepared for any unexpected seizure activity in the peri-operative period.  Objective: Vital signs in last 24 hours: Temp:  [97.7 F (36.5 C)-98.6 F (37 C)] 98.3 F (36.8 C) (07/06 0615) Pulse Rate:  [59-72] 59 (07/06 0615) Resp:  [17-20] 17 (07/06 0615) BP: (121-148)/(60-67) 121/60 mmHg (07/06 0615) SpO2:  [93 %-96 %] 93 % (07/06 0615) Last BM Date: 01/06/16  Intake/Output from previous day: 07/05 0701 - 07/06 0700 In: 1458.8 [I.V.:1308.8; IV Piggyback:150] Out: -  Intake/Output this shift:   PE: Gen:  Alert, NAD, pleasant Card:  RRR, no M/G/R heard Pulm:  CTA, no W/R/R Abd: Soft, mildly TTP RUQ, ND, +BS, no HSM  Lab Results:   Recent Labs  01/05/16 0936 01/06/16 0723  WBC 14.5* 15.3*  HGB 12.5* 12.8*  HCT 37.8* 38.6*  PLT 140* 166   BMET  Recent Labs  01/05/16 0936 01/06/16 0723  NA 131* 132*  K 3.3* 4.0  CL 101 97*  CO2 24 28  GLUCOSE 167* 136*  BUN 11 7  CREATININE 0.97 0.94  CALCIUM 8.0* 8.5*   PT/INR No results for input(s): LABPROT, INR in the last 72 hours. CMP     Component Value Date/Time   NA 132* 01/06/2016 0723   NA 135* 10/18/2015   K 4.0 01/06/2016 0723   CL 97* 01/06/2016 0723   CO2 28 01/06/2016 0723   GLUCOSE  136* 01/06/2016 0723   BUN 7 01/06/2016 0723   BUN 17 10/18/2015   CREATININE 0.94 01/06/2016 0723   CREATININE 0.9 10/18/2015   CALCIUM 8.5* 01/06/2016 0723   PROT 6.1* 01/06/2016 0723   ALBUMIN 2.9* 01/06/2016 0723   AST 17 01/06/2016 0723   ALT 30 01/06/2016 0723   ALKPHOS 51 01/06/2016 0723   BILITOT 0.4 01/06/2016 0723   GFRNONAA >60 01/06/2016 0723   GFRAA >60 01/06/2016 0723   Lipase     Component Value Date/Time   LIPASE 18 01/06/2016 0723   Studies/Results: Ct Abdomen Pelvis Wo Contrast  01/05/2016  CLINICAL DATA:  Upper abdominal pain. Personal history of gallstones. EXAM: CT ABDOMEN AND PELVIS WITHOUT CONTRAST TECHNIQUE: Multidetector CT imaging of the abdomen and pelvis was performed following the standard protocol without IV contrast. COMPARISON:  Radiography same day.  Ultrasound 03/26/2014. FINDINGS: Mild scarring or atelectasis at both lung bases. The liver has a normal appearance without contrast. There is subtle evidence of gallstones. The gallbladder wall appears thick and there is some stranding in the surrounding fat. These findings suggest cholecystitis. Common duct measures up to 1 cm. Common duct stones not excluded. The spleen is normal. The pancreas is normal. The adrenal glands are normal. The kidneys are normal. Aortic atherosclerosis. The IVC is normal. No retroperitoneal mass or adenopathy. No free intraperitoneal air. Shunt tube  enters the abdomen. Tip is in the pelvis. No loculated fluid collections. Bladder, prostate gland and seminal vesicles are unremarkable. Mild lower lumbar degenerative disease. IMPRESSION: CT findings suggesting acute cholecystitis. Subtle evidence of gallstones within the gallbladder. Gallbladder wall thickening. Surrounding inflammation of the fat. Common duct is prominent at 10 mm. Common duct stones not excluded. Aortic atherosclerosis. Electronically Signed   By: Nelson Chimes M.D.   On: 01/05/2016 23:32   Dg Chest 2 View  01/06/2016   CLINICAL DATA:  Pneumonia.  Short of breath.  Fever. EXAM: CHEST  2 VIEW COMPARISON:  01/05/2016 FINDINGS: Cardiac silhouette is mildly enlarged. No mediastinal or hilar masses or evidence of adenopathy. There is central vascular prominence similar to the prior study, without evidence of overt pulmonary edema. Mild lung base opacity is also stable most likely atelectasis. No convincing pneumonia. No pleural effusion or pneumothorax. Ventriculoperitoneal shunt catheter is stable. IMPRESSION: No convincing acute cardiopulmonary disease. Electronically Signed   By: Lajean Manes M.D.   On: 01/06/2016 10:38    Anti-infectives: Anti-infectives    Start     Dose/Rate Route Frequency Ordered Stop   01/06/16 0600  azithromycin (ZITHROMAX) tablet 250 mg  Status:  Discontinued     250 mg Oral Daily 01/05/16 0539 01/06/16 1000   01/05/16 0600  azithromycin (ZITHROMAX) tablet 500 mg  Status:  Discontinued     500 mg Oral Every 24 hours 01/05/16 0534 01/05/16 0539   01/05/16 0600  cefTRIAXone (ROCEPHIN) 1 g in dextrose 5 % 50 mL IVPB     1 g 100 mL/hr over 30 Minutes Intravenous Every 24 hours 01/05/16 0534 01/12/16 0559   01/05/16 0600  azithromycin (ZITHROMAX) tablet 500 mg     500 mg Oral Every 24 hours 01/05/16 0539 01/05/16 0830   01/05/16 0000  piperacillin-tazobactam (ZOSYN) IVPB 3.375 g     3.375 g 100 mL/hr over 30 Minutes Intravenous  Once 01/04/16 2345 01/05/16 0043   01/05/16 0000  vancomycin (VANCOCIN) IVPB 1000 mg/200 mL premix     1,000 mg 200 mL/hr over 60 Minutes Intravenous  Once 01/04/16 2345 01/05/16 0144     Assessment/Plan Symptomatic cholelithiasis, suspect acute cholecystitis  Biliary dyskinesia Leukocystosis - CBD dilatation without elevated LFTs. Alk phos and total bili are normal.  - nausea and TTP of RUQ with known history of gallstones. - WBC: 15.3  FEN: NPO DVT Proph: SCD's ID: ceftriaxone day #4 Dispo: await echo report and proceed with laparoscopic  cholecystectomy today if results are normal.   LOS: 2 days    Jill Alexanders , [facility] 01/07/2016, 7:21 AM Pager: [phone] Consults: [phone] Mon-Fri 7:00 am-4:30 pm Sat-Sun 7:00 am-11:30 am  \

## 2016-01-07 NOTE — Care Management Important Message (Signed)
Important Message  Patient Details  Name: Jeff Weaver MRN: [id number] Date of Birth: 11-18-1945   Medicare Important Message Given:  Yes    Loann Quill 01/07/2016, 10:39 AM

## 2016-01-07 NOTE — Progress Notes (Signed)
PROGRESS NOTE  Jeff Weaver WVP:[id number] DOB: 03/10/1946 DOA: 01/04/2016 PCP: [name], MD  Assessment/Plan: Principal Problem:   Sepsis [facility]) Active Problems:   Essential hypertension   OSA (obstructive sleep apnea)   Seizure disorder (HCC)   Elevated troponin   PMR (polymyalgia rheumatica) (HCC)   Hyponatremia   Hypokalemia   CAP (community acquired pneumonia)  Jeff Weaver is a 70 year old gentleman with a past medical history of cerebral aneurysm, status post clips, history of VP shunt, seizure disorder, admitted to the medicine service on 01/05/2016 when he presented with complaints of mental status changes. On presentation had sepsis criteria with presence of acute kidney injury, hypotension, mental status changes, white count of 14,000. Chest x-ray revealed bibasilar opacities as source of infection suspected to be community-acquired pneumonia. He was started on ceftriaxone and azithromycin. It also complained of abdominal discomfort that was further worked up with a CT scan of abdomen and pelvis. This study revealed gallbladder wall thickening and associated gallstones concerning for acute cholecystitis. General surgery was consulted.  Sepsis  -Present on admission, evidenced by acute encephalopathy, acute kidney injury, white count of 14,000, suspect source of infection to be acute cholecystitis -Placed on Sepsis protocol -Initially treated with ceftriaxone and azithromycin, azithromycin stopped on 01/06/2016   Acute cholecystitis. -Jeff Weaver reporting abdominal discomfort/right upper quadrant pain during this hospitalization for which she was further worked up with CT scan of abdomen and pelvis. Study performed on 01/05/2016 showing findings suggestive of acute cholecystitis. Common bile duct measuring 10 mm. -Lab work showing white count of 15,000 on 01/06/2016 -On physical examination he appear to have a positive Murphy sign. -LFT's stable. -On 01/07/2016 he is taken to  the operating room where he underwent laparoscopic cholecystectomy. I spoke with Dr. Ninfa Linden after procedure and gallbladder appeared infected  Question community acquire pneumonia -Initially required pneumonia suspected because of sepsis -Repeat two-view chest x-ray did not reveal evidence of pneumonia -I think his chest pain could've been related to gallbladder disease rather than pneumonia  AKI:  -Initial presenting with a creatinine 1.6, normalized with IV fluid resuscitation   Elevated troponin:  -troponin flat, echo pending, ekg no acute st/t changes, does has first degree av block, QTc prolongation(577m) on presentation has resolved on repeat ekg (3713m. -Transthoracic echocardiogram showing preserved ejection fraction with evidence of LVH  HTN:  -Currently on nifedipine 30 mg daily, will restart his Cozaar at 50 mg by mouth daily with blood pressures rising   Hyponatremia:  Likely hypovolemic. improving with fluids, continue hold lasix.  Hypokalemia: Replaced, mag 1.7 s/p 2g iv mag as well   Seizures: -Continue home Vimpat -Continue home supply of Eslicarbazepine and Keppra -reported patient has been having seizures about once every 42m72monthlast this past Thursday.  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   Bladder incontinence and BPH: -Hold mirabegron while in hospital -Continue tamsulosin  Body mass index is 38.93 kg/(m^2).   DVT prophylaxis: Lovenox  Code Status: FULL  Family Communication: I spoke to his wife and daughter Disposition Plan: Anticipate discharge in the next 24-48 hours, follow general surgery's recommendations Consults called: General surgery   Procedures:  none  Antibiotics:  Vanc/zosynx1  on the ED   Rocephin/zithro since admitted    Objective: BP 157/81 mmHg  Pulse 83  Temp(Src) 98 F (36.7 C) (Oral)  Resp 17  Ht 5' 8"  (1.727  m)  Wt 116.121  kg (256 lb)  BMI 38.93 kg/m2  SpO2 95%  Intake/Output Summary (Last 24 hours) at 01/07/16 1521 Last data filed at 01/07/16 1430  Gross per 24 hour  Intake 2258.75 ml  Output    200 ml  Net 2058.75 ml   Filed Weights   01/04/16 2306  Weight: 116.121 kg (256 lb)    Exam:   General:  NAD, amber R his room no acute distress  Cardiovascular: RRR  Respiratory: CTABL  Abdomen: Soft, having mild generalized tenderness, there was pain to palpation over right upper quadrant with deep inspiration  Musculoskeletal: No Edema  Neuro: aaox3  Data Reviewed: Basic Metabolic Panel:  Recent Labs Lab 01/04/16 2320 01/05/16 0936 01/06/16 0723  NA 129* 131* 132*  K 3.4* 3.3* 4.0  CL 94* 101 97*  CO2 25 24 28   GLUCOSE 132* 167* 136*  BUN 20 11 7   CREATININE 1.60* 0.97 0.94  CALCIUM 8.5* 8.0* 8.5*  MG  --  1.7 2.4   Liver Function Tests:  Recent Labs Lab 01/04/16 2320 01/06/16 0723  AST 23 17  ALT 30 30  ALKPHOS 44 51  BILITOT 0.7 0.4  PROT 6.6 6.1*  ALBUMIN 3.7 2.9*    Recent Labs Lab 01/06/16 0723  LIPASE 18   No results for input(s): AMMONIA in the last 168 hours. CBC:  Recent Labs Lab 01/04/16 2320 01/05/16 0936 01/06/16 0723  WBC 16.8* 14.5* 15.3*  NEUTROABS 13.9*  --   --   HGB 13.4 12.5* 12.8*  HCT 38.4* 37.8* 38.6*  MCV 90.8 91.3 92.8  PLT 164 140* 166   Cardiac Enzymes:    Recent Labs Lab 01/05/16 0020 01/05/16 1335  TROPONINI 0.03* 0.03*   BNP (last 3 results)  Recent Labs  01/05/16  BNP 102.5*    ProBNP (last 3 results) No results for input(s): PROBNP in the last 8760 hours.  CBG: No results for input(s): GLUCAP in the last 168 hours.  Recent Results (from the past 240 hour(s))  Blood Culture (routine x 2)     Status: None (Preliminary result)   Collection Time: 01/04/16 11:45 PM  Result Value Ref Range Status   Specimen Description BLOOD RIGHT FOREARM  Final   Special Requests BOTTLES DRAWN AEROBIC AND  ANAEROBIC 5CC EACH  Final   Culture   Final    NO GROWTH 2 DAYS Performed at [facility]    Report Status PENDING  Incomplete  Blood Culture (routine x 2)     Status: None (Preliminary result)   Collection Time: 01/05/16 12:00 AM  Result Value Ref Range Status   Specimen Description BLOOD LEFT HAND  Final   Special Requests BOTTLES DRAWN AEROBIC AND ANAEROBIC 5ML EACH  Final   Culture   Final    NO GROWTH 2 DAYS Performed at [facility]    Report Status PENDING  Incomplete  Urine culture     Status: None   Collection Time: 01/05/16  1:15 AM  Result Value Ref Range Status   Specimen Description URINE, CLEAN CATCH  Final   Special Requests NONE  Final   Culture NO GROWTH Performed at [facility]   Final   Report Status 01/06/2016 FINAL  Final  MRSA PCR Screening     Status: None   Collection Time: 01/05/16  6:51 PM  Result Value Ref Range Status   MRSA by PCR NEGATIVE NEGATIVE Final    Comment:        The GeneXpert  MRSA Assay (FDA approved for NASAL specimens only), is one component of a comprehensive MRSA colonization surveillance program. It is not intended to diagnose MRSA infection nor to guide or monitor treatment for MRSA infections.      Studies: No results found.  Scheduled Meds: . [MAR Hold] cefTRIAXone (ROCEPHIN)  IV  1 g Intravenous Q24H  . [MAR Hold] Eslicarbazepine Acetate  600 mg Oral Q12H  . [MAR Hold] famotidine  20 mg Oral BID  . [MAR Hold] feeding supplement (ENSURE ENLIVE)  237 mL Oral BID BM  . [MAR Hold] fluticasone  2 spray Each Nare Daily  . HYDROmorphone      . HYDROmorphone      . [MAR Hold] ketorolac  7.5 mg Intravenous Once  . [MAR Hold] Lacosamide  100 mg Oral Daily  . [MAR Hold] Lacosamide  200 mg Oral Daily  . [MAR Hold] levETIRAcetam  250 mg Oral Q12H  . [MAR Hold] NIFEdipine  30 mg Oral Daily  . [MAR Hold] sodium chloride flush  3 mL Intravenous Q12H  . [MAR Hold] tamsulosin  0.8 mg Oral QHS     Continuous Infusions: . sodium chloride 75 mL/hr at 01/06/16 1233  . lactated ringers       Time spent: 35 mins    Kelvin Cellar MD [facility] Pager [phone]. If 7PM-7AM, please contact night-coverage at [email or web address], password [facility] 01/07/2016, 3:21 PM  LOS: 2 days

## 2016-01-07 NOTE — Anesthesia Procedure Notes (Addendum)
Procedure Name: Intubation Date/Time: 01/07/2016 12:22 PM Performed by: Rush Farmer E Pre-anesthesia Checklist: Patient identified, Emergency Drugs available, Suction available and Patient being monitored Patient Re-evaluated:Patient Re-evaluated prior to inductionOxygen Delivery Method: Circle system utilized Preoxygenation: Pre-oxygenation with 100% oxygen Intubation Type: IV induction and Cricoid Pressure applied Ventilation: Oral airway inserted - appropriate to patient size and Two handed mask ventilation required Laryngoscope Size: Mac and 4 Grade View: Grade II Tube type: Oral Tube size: 7.5 mm Number of attempts: 1 Airway Equipment and Method: Stylet Placement Confirmation: ETT inserted through vocal cords under direct vision,  positive ETCO2 and breath sounds checked- equal and bilateral Secured at: 22 cm Tube secured with: Tape Dental Injury: Teeth and Oropharynx as per pre-operative assessment  Comments: AOI per SRNA.

## 2016-01-07 NOTE — Progress Notes (Signed)
Pt admitted to the unit from [facility]. Pt is stable, alert and oriented per baseline. Oriented to room, staff, and call bell. Educated to call for any assistance. Bed in lowest position, call bell within reach- will continue to monitor.

## 2016-01-07 NOTE — Progress Notes (Signed)
Placed pt on cpap from OR. Pt removed  cipap and states he does not want to wear it. RN aware.

## 2016-01-07 NOTE — Op Note (Signed)
LAPAROSCOPIC CHOLECYSTECTOMY   Procedure Note  Jeff Weaver 01/04/2016 - 01/07/2016   Pre-op Diagnosis: acute cholecystitis with cholelithiasis     Post-op Diagnosis: same  Procedure(s): LAPAROSCOPIC CHOLECYSTECTOMY   Surgeon(s): Coralie Keens, MD Georganna Skeans, MD  Anesthesia: General  Staff:  Circulator: Christen Bame, RN Scrub Person: Daleen Bo Barrett; Harrel Lemon, RN Float Surgical Tech: Jenelle Mages Panchit  Estimated Blood Loss: 200 mL               Specimens: sent to path          [name]   Date: 01/07/2016  Time: 1:58 PM

## 2016-01-07 NOTE — Anesthesia Preprocedure Evaluation (Addendum)
Anesthesia Evaluation  Patient identified by MRN, date of birth, ID band Patient awake    Reviewed: Allergy & Precautions, H&P , NPO status , Patient's Chart, lab work & pertinent test results  History of Anesthesia Complications Negative for: history of anesthetic complications  Airway Mallampati: II  TM Distance: >3 FB Neck ROM: full    Dental no notable dental hx.    Pulmonary sleep apnea , former smoker,    Pulmonary exam normal breath sounds clear to auscultation       Cardiovascular hypertension, + Past MI and + Peripheral Vascular Disease  Normal cardiovascular exam+ dysrhythmias  Rhythm:regular Rate:Normal     Neuro/Psych Seizures -,  CVA, Residual Symptoms    GI/Hepatic Neg liver ROS, GERD  ,  Endo/Other  negative endocrine ROS  Renal/GU negative Renal ROS     Musculoskeletal  (+) Arthritis ,   Abdominal   Peds  Hematology negative hematology ROS (+)   Anesthesia Other Findings VP shunt  Reproductive/Obstetrics negative OB ROS                            Anesthesia Physical Anesthesia Plan  ASA: III  Anesthesia Plan: General   Post-op Pain Management:    Induction: Intravenous  Airway Management Planned: Oral ETT  Additional Equipment:   Intra-op Plan:   Post-operative Plan: Extubation in OR  Informed Consent: I have reviewed the patients History and Physical, chart, labs and discussed the procedure including the risks, benefits and alternatives for the proposed anesthesia with the patient or authorized representative who has indicated his/her understanding and acceptance.   Dental Advisory Given  Plan Discussed with: Anesthesiologist, CRNA and Surgeon  Anesthesia Plan Comments:        Anesthesia Quick Evaluation

## 2016-01-08 ENCOUNTER — Encounter (HOSPITAL_COMMUNITY): Payer: Self-pay | Admitting: Surgery

## 2016-01-08 DIAGNOSIS — K810 Acute cholecystitis: Secondary | ICD-10-CM | POA: Diagnosis present

## 2016-01-08 LAB — CBC
HCT: 35.3 % — ABNORMAL LOW (ref 39.0–52.0)
Hemoglobin: 11.6 g/dL — ABNORMAL LOW (ref 13.0–17.0)
MCH: 30.4 pg (ref 26.0–34.0)
MCHC: 32.9 g/dL (ref 30.0–36.0)
MCV: 92.7 fL (ref 78.0–100.0)
Platelets: 196 10*3/uL (ref 150–400)
RBC: 3.81 MIL/uL — ABNORMAL LOW (ref 4.22–5.81)
RDW: 13.6 % (ref 11.5–15.5)
WBC: 10.4 10*3/uL (ref 4.0–10.5)

## 2016-01-08 LAB — COMPREHENSIVE METABOLIC PANEL
ALT: 77 U/L — ABNORMAL HIGH (ref 17–63)
AST: 48 U/L — ABNORMAL HIGH (ref 15–41)
Albumin: 2.3 g/dL — ABNORMAL LOW (ref 3.5–5.0)
Alkaline Phosphatase: 51 U/L (ref 38–126)
Anion gap: 7 (ref 5–15)
BUN: 8 mg/dL (ref 6–20)
CO2: 29 mmol/L (ref 22–32)
Calcium: 8.4 mg/dL — ABNORMAL LOW (ref 8.9–10.3)
Chloride: 100 mmol/L — ABNORMAL LOW (ref 101–111)
Creatinine, Ser: 0.85 mg/dL (ref 0.61–1.24)
GFR calc Af Amer: >60 mL/min (ref >60)
GFR calc non Af Amer: >60 mL/min (ref >60)
Glucose, Bld: 118 mg/dL — ABNORMAL HIGH (ref 65–99)
Potassium: 4.3 mmol/L (ref 3.5–5.1)
Sodium: 136 mmol/L (ref 135–145)
Total Bilirubin: 0.4 mg/dL (ref 0.3–1.2)
Total Protein: 5.6 g/dL — ABNORMAL LOW (ref 6.5–8.1)

## 2016-01-08 MED ORDER — TRAMADOL HCL 50 MG PO TABS: 50.0000 mg | ORAL_TABLET | Freq: Four times a day (QID) | ORAL | Status: DC | PRN

## 2016-01-08 MED ORDER — AMOXICILLIN-POT CLAVULANATE 875-125 MG PO TABS: 1.0000 | ORAL_TABLET | Freq: Two times a day (BID) | ORAL | Status: DC

## 2016-01-08 MED ORDER — AMOXICILLIN-POT CLAVULANATE 875-125 MG PO TABS: 1.0000 | ORAL_TABLET | Freq: Two times a day (BID) | ORAL | Status: AC

## 2016-01-08 NOTE — Progress Notes (Signed)
[facility] Progress Note  1 Day Post-Op  Subjective: Patient sitting up drinking ensure. Abdominal pain 6/10 around incision sites, worse with movement. Family has to keep reminding him that he had surgery due to his baseline short term memory loss. Not taking deep breaths, I will order IS.  Family reports some blood on his underwear yesterday, suspect due to local irration after foley catheter. Urinating without hesitancy. Denies hematuria and dysuria today. + flatus. No BM.  Objective: Vital signs in last 24 hours: Temp:  [97.7 F (36.5 C)-98.9 F (37.2 C)] 98.9 F (37.2 C) (07/07 0503) Pulse Rate:  [61-90] 61 (07/07 0503) Resp:  [12-21] 18 (07/07 0030) BP: (135-175)/(67-93) 148/67 mmHg (07/07 0503) SpO2:  [92 %-100 %] 96 % (07/07 0503) Last BM Date: 01/06/16  Intake/Output from previous day: 07/06 0701 - 07/07 0700 In: 1980 [P.O.:30; I.V.:1900; IV Piggyback:50] Out: 700 [Urine:500; Blood:200] Intake/Output this shift: Total I/O In: 240 [P.O.:240] Out: -   PE: Gen:  Alert, NAD, pleasant Card:  RRR, no M/G/R heard Pulm:  CTA, no W/R/R Abd: Soft, appropriately tender, +BS, incisions c/d/i  Lab Results:   Recent Labs  01/06/16 0723 01/08/16 0649  WBC 15.3* 10.4  HGB 12.8* 11.6*  HCT 38.6* 35.3*  PLT 166 196   BMET  Recent Labs  01/06/16 0723 01/08/16 0649  NA 132* 136  K 4.0 4.3  CL 97* 100*  CO2 28 29  GLUCOSE 136* 118*  BUN 7 8  CREATININE 0.94 0.85  CALCIUM 8.5* 8.4*   PT/INR No results for input(s): LABPROT, INR in the last 72 hours. CMP     Component Value Date/Time   NA 136 01/08/2016 0649   NA 135* 10/18/2015   K 4.3 01/08/2016 0649   CL 100* 01/08/2016 0649   CO2 29 01/08/2016 0649   GLUCOSE 118* 01/08/2016 0649   BUN 8 01/08/2016 0649   BUN 17 10/18/2015   CREATININE 0.85 01/08/2016 0649   CREATININE 0.9 10/18/2015   CALCIUM 8.4* 01/08/2016 0649   PROT 5.6* 01/08/2016 0649   ALBUMIN 2.3* 01/08/2016 0649   AST 48*  01/08/2016 0649   ALT 77* 01/08/2016 0649   ALKPHOS 51 01/08/2016 0649   BILITOT 0.4 01/08/2016 0649   GFRNONAA >60 01/08/2016 0649   GFRAA >60 01/08/2016 0649   Lipase     Component Value Date/Time   LIPASE 18 01/06/2016 0723   Studies/Results: Dg Chest 2 View  01/06/2016  CLINICAL DATA:  Pneumonia.  Short of breath.  Fever. EXAM: CHEST  2 VIEW COMPARISON:  01/05/2016 FINDINGS: Cardiac silhouette is mildly enlarged. No mediastinal or hilar masses or evidence of adenopathy. There is central vascular prominence similar to the prior study, without evidence of overt pulmonary edema. Mild lung base opacity is also stable most likely atelectasis. No convincing pneumonia. No pleural effusion or pneumothorax. Ventriculoperitoneal shunt catheter is stable. IMPRESSION: No convincing acute cardiopulmonary disease. Electronically Signed   By: Lajean Manes M.D.   On: 01/06/2016 10:38    Anti-infectives: Anti-infectives    Start     Dose/Rate Route Frequency Ordered Stop   01/06/16 0600  azithromycin (ZITHROMAX) tablet 250 mg  Status:  Discontinued     250 mg Oral Daily 01/05/16 0539 01/06/16 1000   01/05/16 0600  azithromycin (ZITHROMAX) tablet 500 mg  Status:  Discontinued     500 mg Oral Every 24 hours 01/05/16 0534 01/05/16 0539   01/05/16 0600  cefTRIAXone (ROCEPHIN) 1 g in dextrose 5 % 50  mL IVPB     1 g 100 mL/hr over 30 Minutes Intravenous Every 24 hours 01/05/16 0534 01/12/16 0559   01/05/16 0600  azithromycin (ZITHROMAX) tablet 500 mg     500 mg Oral Every 24 hours 01/05/16 0539 01/05/16 0830   01/05/16 0000  piperacillin-tazobactam (ZOSYN) IVPB 3.375 g     3.375 g 100 mL/hr over 30 Minutes Intravenous  Once 01/04/16 2345 01/05/16 0043   01/05/16 0000  vancomycin (VANCOCIN) IVPB 1000 mg/200 mL premix     1,000 mg 200 mL/hr over 60 Minutes Intravenous  Once 01/04/16 2345 01/05/16 0144     Assessment/Plan acute cholecystitis, biliary dyskinesia s/p laparoscopic cholecystectomy by Dr.  Ninfa Linden (POD#1) Leukocystosis - resolved  FEN: full liquids DVT Proph: lovenox, SCD's ID: ceftriaxone day #5, will need 2 weeks of outpatient abx at discharge  Dispo: patient tolerating diet, ambulating, urinating, and passing flatus. His surgery was difficult and his gallbladder was diffusely inflamed so he will require outpatient antibiotic treatment but, from a surgical standpoint, he can can discharged today with outpatient follow-up.     LOS: 3 days    [facility] 01/08/2016, 9:05 AM Pager: [phone] Consults: [phone] Mon-Fri 7:00 am-4:30 pm Sat-Sun 7:00 am-11:30 am

## 2016-01-08 NOTE — Progress Notes (Signed)
   01/08/16 0030  BiPAP/CPAP/SIPAP  BiPAP/CPAP/SIPAP Pt Type Adult  Mask Type Full face mask  Mask Size Large  Set Rate 0 breaths/min  Respiratory Rate 18 breaths/min  IPAP 13 cmH20  EPAP 13 cmH2O  Oxygen Percent 28 %  Flow Rate 2 lpm  BiPAP/CPAP/SIPAP CPAP  Patient Home Equipment No  Auto Titrate No  BiPAP/CPAP /SiPAP Vitals  Resp 18

## 2016-01-08 NOTE — Op Note (Signed)
NAMEKADDEN, OSTERHOUT NO.:  [id number]  MEDICAL RECORD NO.:  [id number]  LOCATION:  [id number]                        FACILITY:  [facility]  PHYSICIAN:  Coralie Keens, M.D. DATE OF BIRTH:  Jan 15, 1946  DATE OF PROCEDURE:  01/07/2016 DATE OF DISCHARGE:                              OPERATIVE REPORT   PREOPERATIVE DIAGNOSIS:  Acute cholecystitis with cholelithiasis.  POSTOPERATIVE DIAGNOSIS:  Acute cholecystitis with cholelithiasis.  PROCEDURE:  Laparoscopic cholecystectomy.  SURGEON:  Coralie Keens, M.D.  ASSISTANT:  Merri Ray. Grandville Silos, M.D.  ANESTHESIA:  General endotracheal anesthesia.  ESTIMATED BLOOD LOSS:  200 mL.  INDICATIONS:  This is a 70 year old gentleman, presents with right upper quadrant abdominal pain.  Findings on CT scan were consistent with acute cholecystitis.  Liver function tests were normal.  His bile duct was dilated on CT scan.  Decision was made to proceed with cholecystectomy.  FINDINGS:  The patient was found to have acutely inflamed gallbladder packed with gallstones which was slightly intrahepatic.  There was a large amount of omentum fused to the gallbladder secondary to its acutely inflamed nature.  PROCEDURE IN DETAIL:  The patient was brought to the operating room, identified as Jeff Weaver.  He was placed supine on the operating table. General anesthesia was induced.  His abdomen was then prepped and draped in usual sterile fashion.  I made a small vertical incision above the umbilicus.  I carried this down to the fascia which was then opened with scalpel.  Hemostat was used to pass into the peritoneal cavity under direct vision.  A 0 Vicryl pursestring suture was then placed around the fascial opening.  The stump was placed through the opening and insufflation of the abdomen was begun.  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  I was able to  easily make incision in the epigastrium to the right upper quadrant all under direct vision.  I was able to put ports all under direct vision in these areas.  Three 5 mm ports were used.  The patient had omentum fused to the gallbladder itself, and I could not visualize the gallbladder itself and I was able to find and elevate the liver and peel the omentum slowly off.  This was very difficult, and again the gallbladder was quite, diffusely inflamed, and firm.  Once I was able to finally elevate up the gallbladder, it tore and several stones came out.  After a significant amount of dissection, we were finally able to dissect down to the base of the gallbladder.  The cystic duct was identified and a critical window was achieved around it.  I decided to upgrade the 5 mm epigastric port to a 10 mm, and this was done under direct vision.  I then used a 10 mm clip plier to clip the cystic duct twice.  I then transected distally.  The cystic artery was slight fused to the cystic duct and was able to be clipped with surgical clips.  I was then able to slowly dissect free the gallbladder from the liver bed.  Again, it  was imbedded into the liver bed and this was quite difficult to dissect the gallbladder free.  Once the gallbladder finally free, I was able to put in an endosac.  I used a separate end site to remove all visible stones as well.  I then copiously irrigated the abdomen with normal saline.  I placed a piece of surgical SNoW in the gallbladder bed as well as down the area of the gallbladder cystic artery.  Hemostasis appeared to be achieved.  I then irrigated again with several liters of normal saline.  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  At this point, hemostasis appeared to be achieved.  Decision was made to forego drain placement.  I removed the port of the umbilicus and closed the fascia there with a 0 Vicryl suture.  I then removed all the rest of ports.  This was done  under direct vision.  All incisions were anesthetized with Marcaine and closed with 4-0 Monocryl subcuticular sutures.  Skin glue was then applied. The patient tolerated the procedure well.  All the counts were correct at the end of procedure.  The patient was then extubated in the operating room and taken in stable condition to recovery room.     Coralie Keens, M.D.     DB/MEDQ  D:  01/07/2016  T:  01/08/2016  Job:  [id number]

## 2016-01-08 NOTE — Discharge Instructions (Signed)
Your appointment is at 9:30 AM Wednesday 01/27/16, please arrive at least 30 min before your appointment to complete your check in paperwork.  If you are unable to arrive 30 min prior to your appointment time we may have to cancel or reschedule you.  LAPAROSCOPIC SURGERY: POST OP INSTRUCTIONS  1. DIET: Follow a light bland diet the first 24 hours after arrival home, such as soup, liquids, crackers, etc. Be sure to include lots of fluids daily. Avoid fast food or heavy meals as your are more likely to get nauseated. Eat a low fat the next few days after surgery.  2. Take your usually prescribed home medications unless otherwise directed. 3. PAIN CONTROL:  1. Pain is best controlled by a usual combination of three different methods TOGETHER:  1. Ice/Heat 2. Over the counter pain medication 3. Prescription pain medication 2. Most patients will experience some swelling and bruising around the incisions. Ice packs or heating pads (30-60 minutes up to 6 times a day) will help. Use ice for the first few days to help decrease swelling and bruising, then switch to heat to help relax tight/sore spots and speed recovery. Some people prefer to use ice alone, heat alone, alternating between ice & heat. Experiment to what works for you. Swelling and bruising can take several weeks to resolve.  3. It is helpful to take an over-the-counter pain medication regularly for the first few weeks. Choose one of the following that works best for you:  1. Naproxen (Aleve, etc) Two 242m tabs twice a day 2. Ibuprofen (Advil, etc) Three 2083mtabs four times a day (every meal & bedtime) 3. Acetaminophen (Tylenol, etc) 500-65080mour times a day (every meal & bedtime) 4. A prescription for pain medication (such as oxycodone, hydrocodone, etc) should be given to you upon discharge. Take your pain medication as prescribed.  1. If you are having problems/concerns with the prescription medicine (does not control pain, nausea,  vomiting, rash, itching, etc), please call us [phone] see if we need to switch you to a different pain medicine that will work better for you and/or control your side effect better. 2. If you need a refill on your pain medication, please contact your pharmacy. They will contact our office to request authorization. Prescriptions will not be filled after 5 pm or on week-ends. 4. Avoid getting constipated. Between the surgery and the pain medications, it is common to experience some constipation. Increasing fluid intake and taking a fiber supplement (such as Metamucil, Citrucel, FiberCon, MiraLax, etc) 1-2 times a day regularly will usually help prevent this problem from occurring. A mild laxative (prune juice, Milk of Magnesia, MiraLax, etc) should be taken according to package directions if there are no bowel movements after 48 hours.  5. Watch out for diarrhea. If you have many loose bowel movements, simplify your diet to bland foods & liquids for a few days. Stop any stool softeners and decrease your fiber supplement. Switching to mild anti-diarrheal medications (Kayopectate, Pepto Bismol) can help. If this worsens or does not improve, please call us.[place]. Wash / shower every day. You may shower over the dressings as they are waterproof. Continue to shower over incision(s) after the dressing is off. 7. Remove your waterproof bandages 5 days after surgery. You may leave the incision open to air. You may replace a dressing/Band-Aid to cover the incision for comfort if you wish.  8. ACTIVITIES as tolerated:  1. You may resume regular (light) daily activities beginning the  next day--such as daily self-care, walking, climbing stairs--gradually increasing activities as tolerated. If you can walk 30 minutes without difficulty, it is safe to try more intense activity such as jogging, treadmill, bicycling, low-impact aerobics, swimming, etc. 2. Save the most intensive and strenuous activity for last such as  sit-ups, heavy lifting, contact sports, etc Refrain from any heavy lifting or straining until you are off narcotics for pain control.  3. DO NOT PUSH THROUGH PAIN. Let pain be your guide: If it hurts to do something, don't do it. Pain is your body warning you to avoid that activity for another week until the pain goes down. 4. You may drive when you are no longer taking prescription pain medication, you can comfortably wear a seatbelt, and you can safely maneuver your car and apply brakes. 5. You may have sexual intercourse when it is comfortable.  9. FOLLOW UP in our office  1. Please call [facility] at [phone] to set up an appointment to see your surgeon in the office for a follow-up appointment approximately 2-3 weeks after your surgery. 2. Make sure that you call for this appointment the day you arrive home to insure a convenient appointment time.      10. IF YOU HAVE DISABILITY OR FAMILY LEAVE FORMS, BRING THEM TO THE               OFFICE FOR PROCESSING.   WHEN TO CALL us [phone]:  1. Poor pain control 2. Reactions / problems with new medications (rash/itching, nausea, etc)  3. Fever over 101.5 F (38.5 C) 4. Inability to urinate 5. Nausea and/or vomiting 6. Worsening swelling or bruising 7. Continued bleeding from incision. 8. Increased pain, redness, or drainage from the incision  The clinic staff is available to answer your questions during regular business hours (8:30am-5pm). Please dont hesitate to call and ask to speak to one of our nurses for clinical concerns.  If you have a medical emergency, go to the nearest emergency room or call 911.  A surgeon from [facility] is always on call at the [facility], [place], [place], [place], [place] [address] ?  MAIN: [phone] ? TOLL FREE: [phone] ?  FAX [phone]  [email or web address]  Low-Fiber Diet Fiber is found in fruits, vegetables, and  whole grains. A low-fiber diet restricts fibrous foods that are not digested in the small intestine. A diet containing about 10-15 grams of fiber per day is considered low fiber. Low-fiber diets may be used to:  Promote healing and rest the bowel during intestinal flare-ups.  Prevent blockage of a partially obstructed or narrowed gastrointestinal tract.  Reduce fecal weight and volume.  Slow the movement of feces. You may be on a low-fiber diet as a transitional diet following surgery, after an injury (trauma), or because of a short (acute) or lifelong (chronic) illness. Your health care provider will determine the length of time you need to stay on this diet.  WHAT DO I NEED TO KNOW ABOUT A LOW-FIBER DIET? Always check the fiber content on the packaging's Nutrition Facts label, especially on foods from the grains list. Ask your dietitian if you have questions about specific foods that are related to your condition, especially if the food is not listed below. In general, a low-fiber food will have less than 2 g of fiber. WHAT FOODS CAN I EAT? Grains All breads and crackers made with white flour. Sweet rolls, doughnuts,  waffles, pancakes, [place] toast, bagels. Pretzels, Melba toast, zwieback. Well-cooked cereals, such as cornmeal, farina, or cream cereals. Dry cereals that do not contain whole grains, fruit, or nuts, such as refined corn, wheat, rice, and oat cereals. Potatoes prepared any way without skins, plain pastas and noodles, refined white rice. Use white flour for baking and making sauces. Use allowed list of grains for casseroles, dumplings, and puddings.  Vegetables Strained tomato and vegetable juices. Fresh lettuce, cucumber, spinach. Well-cooked (no skin or pulp) or canned vegetables, such as asparagus, bean sprouts, beets, carrots, green beans, mushrooms, potatoes, pumpkin, spinach, yellow squash, tomato sauce/puree, turnips, yams, and zucchini. Keep servings limited to  cup.   Fruits All fruit juices except prune juice. Cooked or canned fruits without skin and seeds, such as applesauce, apricots, cherries, fruit cocktail, grapefruit, grapes, mandarin oranges, melons, peaches, pears, pineapple, and plums. Fresh fruits without skin, such as apricots, avocados, bananas, melons, pineapple, nectarines, and peaches. Keep servings limited to  cup or 1 piece.  Meat and Other Protein Sources Ground or well-cooked tender beef, ham, veal, lamb, pork, or poultry. Eggs, plain cheese. Fish, oysters, shrimp, lobster, and other seafood. Liver, organ meats. Smooth nut butters. Dairy All milk products and alternative dairy substitutes, such as soy, rice, almond, and coconut, not containing added whole nuts, seeds, or added fruit. Beverages Decaf coffee, fruit, and vegetable juices or smoothies (small amounts, with no pulp or skins, and with fruits from allowed list), sports drinks, herbal tea. Condiments Ketchup, mustard, vinegar, cream sauce, cheese sauce, cocoa powder. Spices in moderation, such as allspice, basil, bay leaves, celery powder or leaves, cinnamon, cumin powder, curry powder, ginger, mace, marjoram, onion or garlic powder, oregano, paprika, parsley flakes, ground pepper, rosemary, sage, savory, tarragon, thyme, and turmeric. Sweets and Desserts Plain cakes and cookies, pie made with allowed fruit, pudding, custard, cream pie. Gelatin, fruit, ice, sherbet, frozen ice pops. Ice cream, ice milk without nuts. Plain hard candy, honey, jelly, molasses, syrup, sugar, chocolate syrup, gumdrops, marshmallows. Limit overall sugar intake.  Fats and Oil Margarine, butter, cream, mayonnaise, salad oils, plain salad dressings made from allowed foods. Choose healthy fats such as olive oil, canola oil, and omega-3 fatty acids (such as found in salmon or tuna) when possible.  Other Bouillon, broth, or cream soups made from allowed foods. Any strained soup. Casseroles or mixed dishes made  with allowed foods. The items listed above may not be a complete list of recommended foods or beverages. Contact your dietitian for more options.  WHAT FOODS ARE NOT RECOMMENDED? Grains All whole wheat and whole grain breads and crackers. Multigrains, rye, bran seeds, nuts, or coconut. Cereals containing whole grains, multigrains, bran, coconut, nuts, raisins. Cooked or dry oatmeal, steel-cut oats. Coarse wheat cereals, granola. Cereals advertised as high fiber. Potato skins. Whole grain pasta, wild or brown rice. Popcorn. Coconut flour. Bran, buckwheat, corn bread, multigrains, rye, wheat germ.  Vegetables Fresh, cooked or canned vegetables, such as artichokes, asparagus, beet greens, broccoli, Brussels sprouts, cabbage, celery, cauliflower, corn, eggplant, kale, legumes or beans, okra, peas, and tomatoes. Avoid large servings of any vegetables, especially raw vegetables.  Fruits Fresh fruits, such as apples with or without skin, berries, cherries, figs, grapes, grapefruit, guavas, kiwis, mangoes, oranges, papayas, pears, persimmons, pineapple, and pomegranate. Prune juice and juices with pulp, stewed or dried prunes. Dried fruits, dates, raisins. Fruit seeds or skins. Avoid large servings of all fresh fruits. Meats and Other Protein Sources Tough, fibrous meats with gristle. Chunky nut butter.  Cheese made with seeds, nuts, or other foods not recommended. Nuts, seeds, legumes (beans, including baked beans), dried peas, beans, lentils.  Dairy Yogurt or cheese that contains nuts, seeds, or added fruit.  Beverages Fruit juices with high pulp, prune juice. Caffeinated coffee and teas.  Condiments Coconut, maple syrup, pickles, olives. Sweets and Desserts Desserts, cookies, or candies that contain nuts or coconut, chunky peanut butter, dried fruits. Jams, preserves with seeds, marmalade. Large amounts of sugar and sweets. Any other dessert made with fruits from the not recommended list.  Other Soups  made from vegetables that are not recommended or that contain other foods not recommended.  The items listed above may not be a complete list of foods and beverages to avoid. Contact your dietitian for more information.   This information is not intended to replace advice given to you by your health care provider. Make sure you discuss any questions you have with your health care provider.   Document Released: 12/10/2001 Document Revised: 06/25/2013 Document Reviewed: 05/13/2013 Elsevier Interactive Patient Education [employer].

## 2016-01-08 NOTE — Progress Notes (Signed)
AVS given to wife, patient confused.  IV removed previously. Belongings packed.  Transportation with wife. Understanding demonstrated.

## 2016-01-08 NOTE — Discharge Summary (Addendum)
Physician Discharge Summary  Jeff Weaver OIT:[id number] DOB: 1946-06-14 DOA: 01/04/2016  PCP: [name], MD  Admit date: 01/04/2016 Discharge date: 01/10/2016  Time spent: 35 minutes  Recommendations for Outpatient Follow-up:  1. Jeff Weaver was diagnosed with acute cholecystitis, discharged in 2 week course of Augmentin, scheduled follow-up with general surgery in 1-2 weeks  Discharge Diagnoses:  Principal Problem:   Sepsis ([place]) Active Problems:   Cholecystitis, acute   Essential hypertension   OSA (obstructive sleep apnea)   Seizure disorder (HCC)   Elevated troponin   PMR (polymyalgia rheumatica) (HCC)   Hyponatremia   Hypokalemia   Discharge Condition: Stable  Diet recommendation: Heart healthy  Filed Weights   01/04/16 2306  Weight: 116.121 kg (256 lb)    History of present illness:  Jeff Weaver is a 70 y.o. 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  The patient was in his usual state of health until last Thursday when he had a seizure (has them about every 6 months). Then over the weekend, he had some intermittent inattentiveness, nonproductive cough and generalized malaise. This progressed until today when he felt continued cough, some SOB, and was confused and weak and so his family brought him to the ER.  Hospital Course:  Jeff Weaver is a 70 year old gentleman with a past medical history of cerebral aneurysm, status post clips, history of VP shunt, seizure disorder, admitted to the medicine service on 01/05/2016 when he presented with complaints of mental status changes. On presentation had sepsis criteria with presence of acute kidney injury, hypotension, mental status changes, white count of 14,000. Chest x-ray revealed bibasilar opacities as source of infection suspected to be community-acquired pneumonia. He was started on ceftriaxone and azithromycin. He also  complained of abdominal discomfort that was further worked up with a CT scan of abdomen and pelvis. This study revealed gallbladder wall thickening and associated gallstones concerning for acute cholecystitis. Common bile duct measuring 10 mm. Lab work and also revealed an upward trend in his white count to 15,000. General surgery was consulted. On 01/07/2016 he was taken to the operating room where he underwent laparoscopic cholecystectomy, procedure performed by Dr. Ninfa Linden of general surgery. I suspect sepsis was due to acute cholecystitis rather than community acquired pneumonia. Repeat 2 view chest x-ray did not show evidence of infiltrate. After undergoing cholecystectomy his white count trended down to 10,000. By 01/08/2016 he was doing much better, tolerating by mouth intake. From a surgery perspective he was cleared to go home on this date. They recommended 2 week course of antibiotic therapy with Augmentin given complicated cholecystitis. Hospitalization complicated by delirium with factors such as infection, surgery, psychotropic medications, pain all likely contributors. On physical exam did not have focal neurologic deficits. By 01/10/2016 he showed clinical improvement. He will follow up with his neurologist at [facility] in several weeks to reassess, I spoke to his family regarding this.    Procedures:  Laparoscopic cholecystectomy performed on 01/07/2016 by Dr. Ninfa Linden of general surgery  Consultations:  General surgery  Discharge Exam: Filed Vitals:   01/09/16 2355 01/10/16 0425  BP: 170/79 139/80  Pulse: 61 64  Temp:  98.1 F (36.7 C)  Resp:  17     General: NAD, amber R his room no acute distress  Cardiovascular: RRR  Respiratory: CTABL  Abdomen: Soft, having mild generalized tenderness, surgical incision sites appear clean no evidence of  hematoma or infection  Musculoskeletal: No Edema  Neuro: aaox3  Discharge Instructions   Discharge Instructions     Call MD for:  difficulty breathing, headache or visual disturbances    Complete by:  As directed      Call MD for:  difficulty breathing, headache or visual disturbances    Complete by:  As directed      Call MD for:  extreme fatigue    Complete by:  As directed      Call MD for:  extreme fatigue    Complete by:  As directed      Call MD for:  hives    Complete by:  As directed      Call MD for:  hives    Complete by:  As directed      Call MD for:  persistant dizziness or light-headedness    Complete by:  As directed      Call MD for:  persistant dizziness or light-headedness    Complete by:  As directed      Call MD for:  persistant nausea and vomiting    Complete by:  As directed      Call MD for:  persistant nausea and vomiting    Complete by:  As directed      Call MD for:  redness, tenderness, or signs of infection (pain, swelling, redness, odor or green/yellow discharge around incision site)    Complete by:  As directed      Call MD for:  redness, tenderness, or signs of infection (pain, swelling, redness, odor or green/yellow discharge around incision site)    Complete by:  As directed      Call MD for:  severe uncontrolled pain    Complete by:  As directed      Call MD for:  severe uncontrolled pain    Complete by:  As directed      Call MD for:  temperature >100.4    Complete by:  As directed      Call MD for:  temperature >100.4    Complete by:  As directed      Call MD for:    Complete by:  As directed      Call MD for:    Complete by:  As directed      Diet - low sodium heart healthy    Complete by:  As directed      Diet - low sodium heart healthy    Complete by:  As directed      Increase activity slowly    Complete by:  As directed      Increase activity slowly    Complete by:  As directed           Current Discharge Medication List    START taking these medications   Details  amoxicillin-clavulanate (AUGMENTIN) 875-125 MG tablet Take 1 tablet by  mouth 2 (two) times daily. Qty: 28 tablet, Refills: 0      CONTINUE these medications which have NOT CHANGED   Details  blood glucose meter kit and supplies KIT Dispense based on patient and insurance preference. Use up to four times daily as directed. (prediabetes hypoglycemia). Qty: 1 each, Refills: 0   Associated Diagnoses: Pre-diabetes; Hypoglycemia    cetirizine (ZYRTEC) 10 MG tablet Take 1 tablet (10 mg total) by mouth daily. Qty: 30 tablet, Refills: 11    Eslicarbazepine Acetate 400 MG TABS Take 600 mg by mouth 2 (two) times  daily. Take 1.5 in AM and PM    fluticasone (FLONASE) 50 MCG/ACT nasal spray Place 1 spray into both nostrils 2 (two) times daily. Qty: 16 g, Refills: 5    Lacosamide (VIMPAT) 100 MG TABS Take 300 mg by mouth 2 (two) times daily. 1 tablets in the am 2 in the PM    levETIRAcetam (KEPPRA) 500 MG tablet Take 250 mg by mouth 2 (two) times daily.     losartan (COZAAR) 50 MG tablet Take 1 tablet (50 mg total) by mouth daily. Qty: 90 tablet, Refills: 3    Melatonin 3 MG TABS Take 3 mg by mouth at bedtime.    mirabegron ER (MYRBETRIQ) 25 MG TB24 tablet Take 25 mg by mouth.    Multiple Vitamins-Minerals (MULTIVITAMIN) tablet Take 1 tablet by mouth daily.    NIFEdipine (PROCARDIA-XL/ADALAT-CC/NIFEDICAL-XL) 30 MG 24 hr tablet Take 1 tablet (30 mg total) by mouth daily. Qty: 90 tablet, Refills: 3   Associated Diagnoses: Essential hypertension    NON FORMULARY CPAP Machine: authorized by [facility] in [place]    pyridoxine (B-6) 100 MG tablet Take 100 mg by mouth daily.    ranitidine (ZANTAC) 150 MG capsule TAKE 1 CAPSULE EVERY MORNING Qty: 90 capsule, Refills: 1    Tamsulosin HCl (FLOMAX) 0.4 MG CAPS Take 0.8 mg by mouth at bedtime.     thiamine 100 MG tablet Take 100 mg by mouth daily.      STOP taking these medications     amoxicillin (AMOXIL) 500 MG capsule      furosemide (LASIX) 20 MG tablet        Allergies  Allergen Reactions  . Haloperidol Other  (See Comments)    Hallucinations  . Lisinopril     Cough   . Crestor [Rosuvastatin]     Leg pain mainly in knees   . Pravastatin     Rxed by [facility] 2009; "myositis" Ok to take per pt's wife/Dr. Linna Darner  . Simvastatin     2007: nausea & vomiting ; [facility] ( he does not remember such)   Follow-up Information    Follow up with [facility] Go on 01/27/2016.   Specialty:  General Surgery   Why:  For post-operative follow up. your appointment is at 9:30 AM. please arrive 15-30 minutes eartly to get checked in and fill out paperwork.   Contact information:   [address] [place] [phone]      Follow up with [name], MD In 1 week.   Specialty:  Internal Medicine   Contact information:   [place] [address] [phone]        The results of significant diagnostics from this hospitalization (including imaging, microbiology, ancillary and laboratory) are listed below for reference.    Significant Diagnostic Studies: Ct Abdomen Pelvis Wo Contrast  01/05/2016  CLINICAL DATA:  Upper abdominal pain. Personal history of gallstones. EXAM: CT ABDOMEN AND PELVIS WITHOUT CONTRAST TECHNIQUE: Multidetector CT imaging of the abdomen and pelvis was performed following the standard protocol without IV contrast. COMPARISON:  Radiography same day.  Ultrasound 03/26/2014. FINDINGS: Mild scarring or atelectasis at both lung bases. The liver has a normal appearance without contrast. There is subtle evidence of gallstones. The gallbladder wall appears thick and there is some stranding in the surrounding fat. These findings suggest cholecystitis. Common duct measures up to 1 cm. Common duct stones not excluded. The spleen is normal. The pancreas is normal.  The adrenal glands are normal. The kidneys are normal. Aortic atherosclerosis. The IVC is normal. No retroperitoneal mass or adenopathy. No free intraperitoneal air. Shunt tube  enters the abdomen. Tip is in the pelvis. No loculated fluid collections. Bladder, prostate gland and seminal vesicles are unremarkable. Mild lower lumbar degenerative disease. IMPRESSION: CT findings suggesting acute cholecystitis. Subtle evidence of gallstones within the gallbladder. Gallbladder wall thickening. Surrounding inflammation of the fat. Common duct is prominent at 10 mm. Common duct stones not excluded. Aortic atherosclerosis. Electronically Signed   By: Nelson Chimes M.D.   On: 01/05/2016 23:32   Dg Neck Soft Tissue  01/05/2016  CLINICAL DATA:  Acute onset of altered mental status and fever. Assess ventriculoperitoneal shunt. Initial encounter. EXAM: NECK SOFT TISSUES - 1+ VIEW COMPARISON:  CTA of the neck performed 09/03/2014 FINDINGS: The patient's ventriculoperitoneal shunt is grossly unremarkable in appearance. The visualized portions of the lung demonstrate vascular congestion. No acute osseous abnormalities are seen. IMPRESSION: Ventriculoperitoneal shunt is grossly unremarkable in appearance. Electronically Signed   By: Garald Balding M.D.   On: 01/05/2016 00:52   Dg Chest 2 View  01/06/2016  CLINICAL DATA:  Pneumonia.  Short of breath.  Fever. EXAM: CHEST  2 VIEW COMPARISON:  01/05/2016 FINDINGS: Cardiac silhouette is mildly enlarged. No mediastinal or hilar masses or evidence of adenopathy. There is central vascular prominence similar to the prior study, without evidence of overt pulmonary edema. Mild lung base opacity is also stable most likely atelectasis. No convincing pneumonia. No pleural effusion or pneumothorax. Ventriculoperitoneal shunt catheter is stable. IMPRESSION: No convincing acute cardiopulmonary disease. Electronically Signed   By: Lajean Manes M.D.   On: 01/06/2016 10:38   Dg Chest 2 View  01/05/2016  CLINICAL DATA:  Acute onset of altered mental status and fever. Assess ventriculoperitoneal shunt. EXAM: CHEST  2 VIEW COMPARISON:  Chest radiograph performed 12/06/2014  FINDINGS: The lungs are well-aerated. Vascular congestion is noted. Bibasilar airspace opacities may reflect mild interstitial edema or possibly infection. There is no evidence of pleural effusion or pneumothorax. The heart is mildly enlarged. No acute osseous abnormalities are seen. The patient's ventriculoperitoneal shunt is grossly unremarkable in appearance. IMPRESSION: 1. Ventriculoperitoneal shunt is grossly unremarkable in appearance. 2. Vascular congestion and mild cardiomegaly. Bibasilar airspace opacities may reflect mild interstitial edema or possibly infection. Electronically Signed   By: Garald Balding M.D.   On: 01/05/2016 00:50   Dg Abd 1 View  01/05/2016  CLINICAL DATA:  Acute onset of lower abdominal pain. Assess ventriculoperitoneal shunt. Initial encounter. EXAM: ABDOMEN - 1 VIEW COMPARISON:  None. FINDINGS: The visualized bowel gas pattern is unremarkable. Scattered air and stool filled loops of colon are seen; no abnormal dilatation of small bowel loops is seen to suggest small bowel obstruction. No free intra-abdominal air is identified, though evaluation for free air is limited on a single supine view. The visualized osseous structures are within normal limits; the sacroiliac joints are unremarkable in appearance. The ventriculoperitoneal shunt is grossly unremarkable in appearance. IMPRESSION: 1. Visualized ventriculoperitoneal shunt is grossly unremarkable. 2. Unremarkable bowel gas pattern; no free intra-abdominal air seen. Small amount of stool noted in the colon. Electronically Signed   By: Garald Balding M.D.   On: 01/05/2016 00:51   Ct Head Wo Contrast  01/05/2016  CLINICAL DATA:  Acute onset of altered mental status and fever. Initial encounter. EXAM: CT HEAD WITHOUT CONTRAST TECHNIQUE: Contiguous axial images were obtained from the base of the skull through the vertex without  intravenous contrast. COMPARISON:  CT of the head performed 12/05/2014 FINDINGS: There is no evidence of  acute infarction, mass lesion, or intra- or extra-axial hemorrhage on CT. Prominence of the ventricles and sulci reflects mild cortical volume loss. There is no evidence of hydrocephalus. Mild cerebellar atrophy is noted. Chronic encephalomalacia is noted at the left frontal lobe. A left frontal ventriculoperitoneal shunt appears grossly intact. An aneurysm clip is noted at the left side of the circle of Willis. The brainstem and fourth ventricle are within normal limits. The basal ganglia are unremarkable in appearance. No mass effect or midline shift is seen. Postoperative change is noted along the left frontoparietal calvarium. The orbits are within normal limits. The paranasal sinuses and mastoid air cells are well-aerated. No significant soft tissue abnormalities are seen. IMPRESSION: 1. No acute intracranial pathology seen on CT. 2. No evidence of hydrocephalus.  Mild cortical volume loss noted. 3. Chronic encephalomalacia at the left frontal lobe. 4. Left frontal ventriculoperitoneal shunt appears grossly intact. Electronically Signed   By: Garald Balding M.D.   On: 01/05/2016 00:59    Microbiology: Recent Results (from the past 240 hour(s))  Blood Culture (routine x 2)     Status: None (Preliminary result)   Collection Time: 01/04/16 11:45 PM  Result Value Ref Range Status   Specimen Description BLOOD RIGHT FOREARM  Final   Special Requests BOTTLES DRAWN AEROBIC AND ANAEROBIC 5CC EACH  Final   Culture   Final    NO GROWTH 4 DAYS Performed at [facility]    Report Status PENDING  Incomplete  Blood Culture (routine x 2)     Status: None (Preliminary result)   Collection Time: 01/05/16 12:00 AM  Result Value Ref Range Status   Specimen Description BLOOD LEFT HAND  Final   Special Requests BOTTLES DRAWN AEROBIC AND ANAEROBIC 5ML EACH  Final   Culture   Final    NO GROWTH 4 DAYS Performed at [facility]    Report Status PENDING  Incomplete  Urine culture     Status: None    Collection Time: 01/05/16  1:15 AM  Result Value Ref Range Status   Specimen Description URINE, CLEAN CATCH  Final   Special Requests NONE  Final   Culture NO GROWTH Performed at [facility]   Final   Report Status 01/06/2016 FINAL  Final  MRSA PCR Screening     Status: None   Collection Time: 01/05/16  6:51 PM  Result Value Ref Range Status   MRSA by PCR NEGATIVE NEGATIVE Final    Comment:        The GeneXpert MRSA Assay (FDA approved for NASAL specimens only), is one component of a comprehensive MRSA colonization surveillance program. It is not intended to diagnose MRSA infection nor to guide or monitor treatment for MRSA infections.      Labs: Basic Metabolic Panel:  Recent Labs Lab 01/05/16 0936 01/06/16 0723 01/08/16 0649 01/09/16 1047 01/10/16 0425  NA 131* 132* 136 131* 130*  K 3.3* 4.0 4.3 4.1 3.7  CL 101 97* 100* 97* 96*  CO2 24 28 29 28 28   GLUCOSE 167* 136* 118* 139* 114*  BUN 11 7 8 10 6   CREATININE 0.97 0.94 0.85 0.82 0.76  CALCIUM 8.0* 8.5* 8.4* 8.6* 8.6*  MG 1.7 2.4  --   --   --    Liver Function Tests:  Recent Labs Lab 01/04/16 2320 01/06/16 0723 01/08/16 0649 01/09/16 1047 01/10/16 0425  AST 23 17 48* 38 31  ALT 30 30 77* 78* 70*  ALKPHOS 44 51 51 53 45  BILITOT 0.7 0.4 0.4 0.2* 0.5  PROT 6.6 6.1* 5.6* 6.0* 5.4*  ALBUMIN 3.7 2.9* 2.3* 2.7* 2.6*    Recent Labs Lab 01/06/16 0723  LIPASE 18   No results for input(s): AMMONIA in the last 168 hours. CBC:  Recent Labs Lab 01/04/16 2320 01/05/16 0936 01/06/16 0723 01/08/16 0649 01/09/16 1047 01/10/16 0425  WBC 16.8* 14.5* 15.3* 10.4 9.8 8.6  NEUTROABS 13.9*  --   --   --   --   --   HGB 13.4 12.5* 12.8* 11.6* 12.1* 11.4*  HCT 38.4* 37.8* 38.6* 35.3* 36.9* 34.8*  MCV 90.8 91.3 92.8 92.7 93.7 91.8  PLT 164 140* 166 196 242 210   Cardiac Enzymes:  Recent Labs Lab 01/05/16 0020 01/05/16 1335  TROPONINI 0.03* 0.03*   BNP: BNP (last 3 results)  Recent  Labs  01/05/16  BNP 102.5*    ProBNP (last 3 results) No results for input(s): PROBNP in the last 8760 hours.  CBG: No results for input(s): GLUCAP in the last 168 hours.     Signed:  Kelvin Cellar MD.  [facility] 01/10/2016, 10:48 AM

## 2016-01-09 DIAGNOSIS — R410 Disorientation, unspecified: Secondary | ICD-10-CM

## 2016-01-09 LAB — CBC
HCT: 36.9 % — ABNORMAL LOW (ref 39.0–52.0)
Hemoglobin: 12.1 g/dL — ABNORMAL LOW (ref 13.0–17.0)
MCH: 30.7 pg (ref 26.0–34.0)
MCHC: 32.8 g/dL (ref 30.0–36.0)
MCV: 93.7 fL (ref 78.0–100.0)
Platelets: 242 10*3/uL (ref 150–400)
RBC: 3.94 MIL/uL — ABNORMAL LOW (ref 4.22–5.81)
RDW: 13.4 % (ref 11.5–15.5)
WBC: 9.8 10*3/uL (ref 4.0–10.5)

## 2016-01-09 LAB — COMPREHENSIVE METABOLIC PANEL
ALT: 78 U/L — ABNORMAL HIGH (ref 17–63)
AST: 38 U/L (ref 15–41)
Albumin: 2.7 g/dL — ABNORMAL LOW (ref 3.5–5.0)
Alkaline Phosphatase: 53 U/L (ref 38–126)
Anion gap: 6 (ref 5–15)
BUN: 10 mg/dL (ref 6–20)
CO2: 28 mmol/L (ref 22–32)
Calcium: 8.6 mg/dL — ABNORMAL LOW (ref 8.9–10.3)
Chloride: 97 mmol/L — ABNORMAL LOW (ref 101–111)
Creatinine, Ser: 0.82 mg/dL (ref 0.61–1.24)
GFR calc Af Amer: >60 mL/min (ref >60)
GFR calc non Af Amer: >60 mL/min (ref >60)
Glucose, Bld: 139 mg/dL — ABNORMAL HIGH (ref 65–99)
Potassium: 4.1 mmol/L (ref 3.5–5.1)
Sodium: 131 mmol/L — ABNORMAL LOW (ref 135–145)
Total Bilirubin: 0.2 mg/dL — ABNORMAL LOW (ref 0.3–1.2)
Total Protein: 6 g/dL — ABNORMAL LOW (ref 6.5–8.1)

## 2016-01-09 MED ORDER — LORAZEPAM 1 MG PO TABS
1.0000 mg | ORAL_TABLET | Freq: Once | ORAL | Status: AC
  Administered 2016-01-09: 1 mg via ORAL
  Filled 2016-01-09: qty 1

## 2016-01-09 MED ORDER — AMOXICILLIN-POT CLAVULANATE 875-125 MG PO TABS
1.0000 | ORAL_TABLET | Freq: Two times a day (BID) | ORAL | Status: DC
  Administered 2016-01-09 – 2016-01-10 (×3): 1 via ORAL
  Filled 2016-01-09 (×3): qty 1

## 2016-01-09 MED ORDER — HYDRALAZINE HCL 20 MG/ML IJ SOLN
5.0000 mg | Freq: Once | INTRAMUSCULAR | Status: AC
  Administered 2016-01-09: 5 mg via INTRAVENOUS
  Filled 2016-01-09: qty 1

## 2016-01-09 NOTE — Progress Notes (Signed)
Placed patient on CPAP for the night set at 13cm with oxygen set at 2lpm with Sp02=95%

## 2016-01-09 NOTE — Progress Notes (Signed)
2 Days Post-Op  Subjective: Wife and daughter report pt is not at baseline with respect to memory and mental status. More confusion than usual. Also developed redness around incision. No n/v. Not sure if ultram contributed to worsening confusion. Eating some.   Objective: Vital signs in last 24 hours: Temp:  [98.1 F (36.7 C)-98.6 F (37 C)] 98.1 F (36.7 C) (07/08 0621) Pulse Rate:  [63-74] 63 (07/08 0621) Resp:  [17] 17 (07/07 1439) BP: (129-136)/(61-66) 129/64 mmHg (07/08 0621) SpO2:  [90 %-93 %] 92 % (07/08 0621) Last BM Date: 01/08/16  Intake/Output from previous day: 07/07 0701 - 07/08 0700 In: 12003.7 [P.O.:680; I.V.:11273.7; IV Piggyback:50] Out: -  Intake/Output this shift:    Alert, awake, sitting in bs chair,  nonfocal  cta b/l Reg Obese, soft, fairly nontender. Has blanching erythema around umbilical inision, and scant blanching erythema around other trocar sites. No raised lesions  Lab Results:   Recent Labs  01/08/16 0649  WBC 10.4  HGB 11.6*  HCT 35.3*  PLT 196   BMET  Recent Labs  01/08/16 0649  NA 136  K 4.3  CL 100*  CO2 29  GLUCOSE 118*  BUN 8  CREATININE 0.85  CALCIUM 8.4*   Hepatic Function Latest Ref Rng 01/08/2016 01/06/2016 01/04/2016  Total Protein 6.5 - 8.1 g/dL 5.6(L) 6.1(L) 6.6  Albumin 3.5 - 5.0 g/dL 2.3(L) 2.9(L) 3.7  AST 15 - 41 U/L 48(H) 17 23  ALT 17 - 63 U/L 77(H) 30 30  Alk Phosphatase 38 - 126 U/L 51 51 44  Total Bilirubin 0.3 - 1.2 mg/dL 0.4 0.4 0.7  Bilirubin, Direct 0.1 - 0.5 mg/dL - 0.2 -     PT/INR No results for input(s): LABPROT, INR in the last 72 hours. ABG No results for input(s): PHART, HCO3 in the last 72 hours.  Invalid input(s): PCO2, PO2  Studies/Results: No results found.  Anti-infectives: Anti-infectives    Start     Dose/Rate Route Frequency Ordered Stop   01/09/16 1000  amoxicillin-clavulanate (AUGMENTIN) 875-125 MG per tablet 1 tablet     1 tablet Oral Every 12 hours 01/09/16 0910     01/08/16 0000  amoxicillin-clavulanate (AUGMENTIN) 875-125 MG tablet  Status:  Discontinued     1 tablet Oral 2 times daily 01/08/16 1056 01/08/16    01/08/16 0000  amoxicillin-clavulanate (AUGMENTIN) 875-125 MG tablet     1 tablet Oral 2 times daily 01/08/16 1427 01/22/16 2359   01/06/16 0600  azithromycin (ZITHROMAX) tablet 250 mg  Status:  Discontinued     250 mg Oral Daily 01/05/16 0539 01/06/16 1000   01/05/16 0600  azithromycin (ZITHROMAX) tablet 500 mg  Status:  Discontinued     500 mg Oral Every 24 hours 01/05/16 0534 01/05/16 0539   01/05/16 0600  cefTRIAXone (ROCEPHIN) 1 g in dextrose 5 % 50 mL IVPB  Status:  Discontinued     1 g 100 mL/hr over 30 Minutes Intravenous Every 24 hours 01/05/16 0534 01/09/16 0910   01/05/16 0600  azithromycin (ZITHROMAX) tablet 500 mg     500 mg Oral Every 24 hours 01/05/16 0539 01/05/16 0830   01/05/16 0000  piperacillin-tazobactam (ZOSYN) IVPB 3.375 g     3.375 g 100 mL/hr over 30 Minutes Intravenous  Once 01/04/16 2345 01/05/16 0043   01/05/16 0000  vancomycin (VANCOCIN) IVPB 1000 mg/200 mL premix     1,000 mg 200 mL/hr over 60 Minutes Intravenous  Once 01/04/16 2345 01/05/16 0144  Assessment/Plan: s/p Procedure(s): LAPAROSCOPIC CHOLECYSTECTOMY  (N/A) HTN OSA Sz disorder H/o cerebral aneurysm with VP shunt  Pt seen in conjunction with Dr Coralyn Pear nonfocal neuro exam. Not sure if med related With confusion and what appears to be celluitis will hold dc today Check labs Encourage PO Cont augmentin for intra-abd GB infection and VP shunt. Should cover cellulitis.  If cellulitis spreads will have to re-evaluate abx regimen Cont chemical vte prophylaxis Limit narcotics  Leighton Ruff. Redmond Pulling, MD, FACS General, Bariatric, & Minimally Invasive Surgery [facility], [place]   LOS: 4 days    Gayland Curry 01/09/2016

## 2016-01-09 NOTE — Progress Notes (Signed)
PROGRESS NOTE  Jeff Weaver FOY:[id number] DOB: 20-Jun-1946 DOA: 01/04/2016 PCP: [name], MD  Assessment/Plan: Principal Problem:   Sepsis [facility]) Active Problems:   Cholecystitis, acute   Essential hypertension   OSA (obstructive sleep apnea)   Seizure disorder (HCC)   Elevated troponin   PMR (polymyalgia rheumatica) (HCC)   Hyponatremia   Hypokalemia  Jeff Weaver is a 69 year old gentleman with a past medical history of cerebral aneurysm, status post clips, history of VP shunt, seizure disorder, admitted to the medicine service on 01/05/2016 when he presented with complaints of mental status changes. On presentation had sepsis criteria with presence of acute kidney injury, hypotension, mental status changes, white count of 14,000. Chest x-ray revealed bibasilar opacities as source of infection suspected to be community-acquired pneumonia. He was started on ceftriaxone and azithromycin. It also complained of abdominal discomfort that was further worked up with a CT scan of abdomen and pelvis. This study revealed gallbladder wall thickening and associated gallstones concerning for acute cholecystitis. General surgery was consulted.  Sepsis  -Present on admission, evidenced by acute encephalopathy, acute kidney injury, white count of 14,000, suspect source of infection to be acute cholecystitis -Placed on Sepsis protocol   Acute cholecystitis. -Jeff Weaver reporting abdominal discomfort/right upper quadrant pain during this hospitalization for which she was further worked up with CT scan of abdomen and pelvis. Study performed on 01/05/2016 showing findings suggestive of acute cholecystitis. Common bile duct measuring 10 mm. -Lab work showing white count of 15,000 on 01/06/2016 -On physical examination he appear to have a positive Murphy sign. -LFT's stable. -On 01/07/2016 he is taken to the operating room where he underwent laparoscopic cholecystectomy. I spoke with Jeff Weaver after  procedure and gallbladder appeared infected  Probable Acute Delirium -Family members at bedside expressing concern for increased confusion -He has a history of cognitive impairment, but overnight family members reported he had several spells of agitation. They are worried that he is not at Jeff baseline -Delirium likely related to multiple factors including pain, psychotropic medications, recent surgery, hospitalization  -He was nonfocal on neurologic exam -Spoke with Jeff Weaver, did not feel there were complicating issues related to lap chole.  -Would NOT recommend ativan.  -Would push for non-pharmcologic strategies, reorientation therapy, minimizing psychotropic meds, mobility.   AKI:  -Initial presenting with a creatinine 1.6, normalized with IV fluid resuscitation   Elevated troponin:  -troponin flat, echo pending, ekg no acute st/t changes, does has first degree av block, QTc prolongation(560m) on presentation has resolved on repeat ekg (3780m. -Transthoracic echocardiogram showing preserved ejection fraction with evidence of LVH  HTN:  -Currently on nifedipine 30 mg daily, will restart Jeff Cozaar at 50 mg by mouth daily with blood pressures rising   Hyponatremia:  Likely hypovolemic. improving with fluids, continue hold lasix.  Hypokalemia: Replaced, mag 1.7 s/p 2g iv mag as well   Seizures: -Continue home Vimpat -Continue home supply of Eslicarbazepine and Keppra -reported patient has been having seizures about once every 87m61monthlast this past Thursday.  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   Bladder incontinence and BPH: -Hold mirabegron while in hospital -Continue tamsulosin  Body mass index is 38.93 kg/(m^2).   DVT prophylaxis: Lovenox  Code Status: FULL  Family Communication: I spoke to Jeff Weaver and Weaver Disposition Plan: Anticipate discharge in the next  24 hours, follow general surgery's recommendations Consults called: General surgery  Procedures:  none  Antibiotics:  Vanc/zosynx1  on the ED   Rocephin/zithro since admitted    Objective: BP 153/79 mmHg  Pulse 64  Temp(Src) 97.6 F (36.4 C) (Oral)  Resp 18  Ht 5' 8"  (1.727 m)  Wt 116.121 kg (256 lb)  BMI 38.93 kg/m2  SpO2 97% No intake or output data in the 24 hours ending 01/09/16 1639 Filed Weights   01/04/16 2306  Weight: 116.121 kg (256 lb)    Exam:   General:    Cardiovascular: RRR  Respiratory: CTABL  Abdomen: Soft, having mild generalized tenderness, there was pain to palpation over right upper quadrant with deep inspiration  Musculoskeletal: No Edema  Neuro: aaox3, nonfocal neurologic exam. 5/5 muscle strength to b/l upper and lower extremities. DTR's intact, no slurred speech or facial droop  Data Reviewed: Basic Metabolic Panel:  Recent Labs Lab 01/04/16 2320 01/05/16 0936 01/06/16 0723 01/08/16 0649 01/09/16 1047  NA 129* 131* 132* 136 131*  K 3.4* 3.3* 4.0 4.3 4.1  CL 94* 101 97* 100* 97*  CO2 25 24 28 29 28   GLUCOSE 132* 167* 136* 118* 139*  BUN 20 11 7 8 10   CREATININE 1.60* 0.97 0.94 0.85 0.82  CALCIUM 8.5* 8.0* 8.5* 8.4* 8.6*  MG  --  1.7 2.4  --   --    Liver Function Tests:  Recent Labs Lab 01/04/16 2320 01/06/16 0723 01/08/16 0649 01/09/16 1047  AST 23 17 48* 38  ALT 30 30 77* 78*  ALKPHOS 44 51 51 53  BILITOT 0.7 0.4 0.4 0.2*  PROT 6.6 6.1* 5.6* 6.0*  ALBUMIN 3.7 2.9* 2.3* 2.7*    Recent Labs Lab 01/06/16 0723  LIPASE 18   No results for input(s): AMMONIA in the last 168 hours. CBC:  Recent Labs Lab 01/04/16 2320 01/05/16 0936 01/06/16 0723 01/08/16 0649 01/09/16 1047  WBC 16.8* 14.5* 15.3* 10.4 9.8  NEUTROABS 13.9*  --   --   --   --   HGB 13.4 12.5* 12.8* 11.6* 12.1*  HCT 38.4* 37.8* 38.6* 35.3* 36.9*  MCV 90.8 91.3 92.8 92.7 93.7  PLT 164 140* 166 196 242   Cardiac Enzymes:    Recent  Labs Lab 01/05/16 0020 01/05/16 1335  TROPONINI 0.03* 0.03*   BNP (last 3 results)  Recent Labs  01/05/16  BNP 102.5*    ProBNP (last 3 results) No results for input(s): PROBNP in the last 8760 hours.  CBG: No results for input(s): GLUCAP in the last 168 hours.  Recent Results (from the past 240 hour(s))  Blood Culture (routine x 2)     Status: None (Preliminary result)   Collection Time: 01/04/16 11:45 PM  Result Value Ref Range Status   Specimen Description BLOOD RIGHT FOREARM  Final   Special Requests BOTTLES DRAWN AEROBIC AND ANAEROBIC 5CC EACH  Final   Culture   Final    NO GROWTH 4 DAYS Performed at [facility]    Report Status PENDING  Incomplete  Blood Culture (routine x 2)     Status: None (Preliminary result)   Collection Time: 01/05/16 12:00 AM  Result Value Ref Range Status   Specimen Description BLOOD LEFT HAND  Final   Special Requests BOTTLES DRAWN AEROBIC AND ANAEROBIC 5ML EACH  Final   Culture   Final    NO GROWTH 4 DAYS Performed at [facility]    Report Status PENDING  Incomplete  Urine culture     Status: None  Collection Time: 01/05/16  1:15 AM  Result Value Ref Range Status   Specimen Description URINE, CLEAN CATCH  Final   Special Requests NONE  Final   Culture NO GROWTH Performed at [facility]   Final   Report Status 01/06/2016 FINAL  Final  MRSA PCR Screening     Status: None   Collection Time: 01/05/16  6:51 PM  Result Value Ref Range Status   MRSA by PCR NEGATIVE NEGATIVE Final    Comment:        The GeneXpert MRSA Assay (FDA approved for NASAL specimens only), is one component of a comprehensive MRSA colonization surveillance program. It is not intended to diagnose MRSA infection nor to guide or monitor treatment for MRSA infections.      Studies: No results found.  Scheduled Meds: . amoxicillin-clavulanate  1 tablet Oral Q12H  . Eslicarbazepine Acetate  600 mg Oral Q12H  . famotidine  20 mg  Oral BID  . feeding supplement (ENSURE ENLIVE)  237 mL Oral BID BM  . fluticasone  2 spray Each Nare Daily  . ketorolac  7.5 mg Intravenous Once  . Lacosamide  100 mg Oral Daily  . Lacosamide  200 mg Oral Daily  . levETIRAcetam  250 mg Oral Q12H  . losartan  50 mg Oral Daily  . NIFEdipine  30 mg Oral Daily  . sodium chloride flush  3 mL Intravenous Q12H  . tamsulosin  0.8 mg Oral QHS    Continuous Infusions: . sodium chloride 50 mL/hr at 01/09/16 1127  . lactated ringers 10 mL/hr at 01/07/16 1735     Time spent: 35 mins    Kelvin Cellar MD [facility] Pager [phone]. If 7PM-7AM, please contact night-coverage at [email or web address], password [facility] 01/09/2016, 4:39 PM  LOS: 4 days

## 2016-01-10 LAB — CULTURE, BLOOD (ROUTINE X 2)
Culture: NO GROWTH
Culture: NO GROWTH

## 2016-01-10 LAB — COMPREHENSIVE METABOLIC PANEL
ALT: 70 U/L — ABNORMAL HIGH (ref 17–63)
AST: 31 U/L (ref 15–41)
Albumin: 2.6 g/dL — ABNORMAL LOW (ref 3.5–5.0)
Alkaline Phosphatase: 45 U/L (ref 38–126)
Anion gap: 6 (ref 5–15)
BUN: 6 mg/dL (ref 6–20)
CO2: 28 mmol/L (ref 22–32)
Calcium: 8.6 mg/dL — ABNORMAL LOW (ref 8.9–10.3)
Chloride: 96 mmol/L — ABNORMAL LOW (ref 101–111)
Creatinine, Ser: 0.76 mg/dL (ref 0.61–1.24)
GFR calc Af Amer: >60 mL/min (ref >60)
GFR calc non Af Amer: >60 mL/min (ref >60)
Glucose, Bld: 114 mg/dL — ABNORMAL HIGH (ref 65–99)
Potassium: 3.7 mmol/L (ref 3.5–5.1)
Sodium: 130 mmol/L — ABNORMAL LOW (ref 135–145)
Total Bilirubin: 0.5 mg/dL (ref 0.3–1.2)
Total Protein: 5.4 g/dL — ABNORMAL LOW (ref 6.5–8.1)

## 2016-01-10 LAB — CBC
HCT: 34.8 % — ABNORMAL LOW (ref 39.0–52.0)
Hemoglobin: 11.4 g/dL — ABNORMAL LOW (ref 13.0–17.0)
MCH: 30.1 pg (ref 26.0–34.0)
MCHC: 32.8 g/dL (ref 30.0–36.0)
MCV: 91.8 fL (ref 78.0–100.0)
Platelets: 210 10*3/uL (ref 150–400)
RBC: 3.79 MIL/uL — ABNORMAL LOW (ref 4.22–5.81)
RDW: 13.1 % (ref 11.5–15.5)
WBC: 8.6 10*3/uL (ref 4.0–10.5)

## 2016-01-10 NOTE — Progress Notes (Signed)
3 Days Post-Op  Subjective: He looks fine, red areas on abdomen are still present but better. Tolerating diet, family is comfortable with where he is and want to go home.  Objective: Vital signs in last 24 hours: Temp:  [97.6 F (36.4 C)-98.1 F (36.7 C)] 98.1 F (36.7 C) (07/09 0425) Pulse Rate:  [61-65] 64 (07/09 0425) Resp:  [15-18] 17 (07/09 0425) BP: (139-187)/(79-88) 139/80 mmHg (07/09 0425) SpO2:  [94 %-98 %] 98 % (07/09 0425) Last BM Date: 01/08/16 120 PO recorded Voided x 2 NA 130 WBC is normal  Intake/Output from previous day: 07/08 0701 - 07/09 0700 In: 1047.5 [P.O.:120; I.V.:927.5] Out: -  Intake/Output this shift: Total I/O In: 100 [I.V.:100] Out: -   General appearance: alert, cooperative and no distress GI: soft, nontender, sites still have the pink around them but it is better.  No issues, tolerating diet and having BM's.  Lab Results:   Recent Labs  01/09/16 1047 01/10/16 0425  WBC 9.8 8.6  HGB 12.1* 11.4*  HCT 36.9* 34.8*  PLT 242 210    BMET  Recent Labs  01/09/16 1047 01/10/16 0425  NA 131* 130*  K 4.1 3.7  CL 97* 96*  CO2 28 28  GLUCOSE 139* 114*  BUN 10 6  CREATININE 0.82 0.76  CALCIUM 8.6* 8.6*   PT/INR No results for input(s): LABPROT, INR in the last 72 hours.   Recent Labs Lab 01/04/16 2320 01/06/16 0723 01/08/16 0649 01/09/16 1047 01/10/16 0425  AST 23 17 48* 38 31  ALT 30 30 77* 78* 70*  ALKPHOS 44 51 51 53 45  BILITOT 0.7 0.4 0.4 0.2* 0.5  PROT 6.6 6.1* 5.6* 6.0* 5.4*  ALBUMIN 3.7 2.9* 2.3* 2.7* 2.6*     Lipase     Component Value Date/Time   LIPASE 18 01/06/2016 0723     Studies/Results: No results found.  Medications: . amoxicillin-clavulanate  1 tablet Oral Q12H  . Eslicarbazepine Acetate  600 mg Oral Q12H  . famotidine  20 mg Oral BID  . feeding supplement (ENSURE ENLIVE)  237 mL Oral BID BM  . fluticasone  2 spray Each Nare Daily  . ketorolac  7.5 mg Intravenous Once  . Lacosamide  100 mg  Oral Daily  . Lacosamide  200 mg Oral Daily  . levETIRAcetam  250 mg Oral Q12H  . losartan  50 mg Oral Daily  . NIFEdipine  30 mg Oral Daily  . sodium chloride flush  3 mL Intravenous Q12H  . tamsulosin  0.8 mg Oral QHS    Assessment/Plan acute cholecystitis, biliary dyskinesia s/p laparoscopic cholecystectomy 01/08/16, Dr. Ninfa Linden  Leukocystosis - resolved  8.6 this AM  FEN: soft DVT Proph: SCD's ID: ceftriaxone  X 4 days, Augmentin day 2 , will need 2 weeks of outpatient abx at discharge   Plan:  Ready for discharge today, follow up in AVS..    LOS: 5 days    Jeff Weaver 01/10/2016 [phone]

## 2016-01-11 ENCOUNTER — Telehealth: Payer: Self-pay

## 2016-01-11 NOTE — Telephone Encounter (Signed)
Pt was DC'ed after having a cholecystectomy. Spoke to pt dtr and spouse.   Transition Care Management Follow-up Telephone Call   Date discharged? 01/09/2016  How have you been since you were released from the hospital? Pt is doing okay.   Do you understand why you were in the hospital? Dtr stated that she did understand.   Do you understand the discharge instructions? Yes, dtr understands.   Where were you discharged to? Home  Items Reviewed:  Medications reviewed: yes, dtr did have some concern about med changes in the hospital.   Allergies reviewed: yes  Dietary changes reviewed: yes  Referrals reviewed: yes  Functional Questionnaire:   Activities of Daily Living (ADLs):   States they are independent in the following: Independent is some but still needs assistance.   States they require assistance with the following: generalized weakness is stated.   Any transportation issues/concerns?: No  Any patient concerns? Medication change from ED admission.   Confirmed importance and date/time of follow-up visits scheduled Yes  Provider Appointment booked with PCP on 01/28/16 at 10:45  Confirmed with patient if condition begins to worsen call PCP or go to the ER.  Patient was given the office number and encouraged to call back with question or concerns: Pt dtr stated understanding.

## 2016-01-12 NOTE — Progress Notes (Signed)
PROGRESS NOTE  Edwing Figley ZOX:[id number] DOB: 1946-04-21 DOA: 01/04/2016 PCP: [name], MD  Assessment/Plan: Principal Problem:   Sepsis [facility]) Active Problems:   Cholecystitis, acute   Essential hypertension   OSA (obstructive sleep apnea)   Seizure disorder (HCC)   Elevated troponin   PMR (polymyalgia rheumatica) (HCC)   Hyponatremia   Hypokalemia  Mr Nocera is a 70 year old gentleman with a past medical history of cerebral aneurysm, status post clips, history of VP shunt, seizure disorder, admitted to the medicine service on 01/05/2016 when he presented with complaints of mental status changes. On presentation had sepsis criteria with presence of acute kidney injury, hypotension, mental status changes, white count of 14,000. Chest x-ray revealed bibasilar opacities as source of infection suspected to be community-acquired pneumonia. He was started on ceftriaxone and azithromycin. It also complained of abdominal discomfort that was further worked up with a CT scan of abdomen and pelvis. This study revealed gallbladder wall thickening and associated gallstones concerning for acute cholecystitis. General surgery was consulted.  Sepsis  -Present on admission, evidenced by acute encephalopathy, acute kidney injury, white count of 14,000, suspect source of infection to be acute cholecystitis   Acute cholecystitis. -Mr Polio reporting abdominal discomfort/right upper quadrant pain during this hospitalization for which she was further worked up with CT scan of abdomen and pelvis. Study performed on 01/05/2016 showing findings suggestive of acute cholecystitis. Common bile duct measuring 10 mm. -Lab work showing white count of 15,000 on 01/06/2016 -On physical examination he appear to have a positive Murphy sign. -LFT's stable. -On 01/07/2016 he is taken to the operating room where he underwent laparoscopic cholecystectomy.  Probable Acute Delirium -Family members at bedside  expressing concern for increased confusion -He has a history of cognitive impairment, but overnight family members reported he had several spells of agitation. They are worried that he is not at his baseline -Delirium likely related to multiple factors including pain, psychotropic medications, recent surgery, hospitalization  -He was nonfocal on neurologic exam   Planned discharge for today was put on hold due to development of mental status changes.      Body mass index is 38.93 kg/(m^2).   DVT prophylaxis: Lovenox  Code Status: FULL  Family Communication: I spoke to his wife and daughter Disposition Plan: Anticipate discharge in the next 24-48 hours, follow general surgery's recommendations Consults called: General surgery   Procedures:  none  Antibiotics:  Vanc/zosynx1  on the ED   Rocephin/zithro since admitted    Objective: BP 139/80 mmHg  Pulse 64  Temp(Src) 98.1 F (36.7 C) (Oral)  Resp 17  Ht 5' 8"  (1.727 m)  Wt 116.121 kg (256 lb)  BMI 38.93 kg/m2  SpO2 98% No intake or output data in the 24 hours ending 01/12/16 1735 Filed Weights   01/04/16 2306  Weight: 116.121 kg (256 lb)    Exam:   General:  NAD, seems mildly confused  Cardiovascular: RRR  Respiratory: CTABL  Abdomen: Soft, having mild generalized tenderness, surgical incision sites appear clean  Musculoskeletal: No Edema  Neuro: He had a non-focal neurologic exam  Data Reviewed: Basic Metabolic Panel:  Recent Labs Lab 01/06/16 0723 01/08/16 0649 01/09/16 1047 01/10/16 0425  NA 132* 136 131* 130*  K 4.0 4.3 4.1 3.7  CL 97* 100* 97* 96*  CO2 28 29 28 28   GLUCOSE 136* 118* 139* 114*  BUN 7 8 10 6   CREATININE 0.94 0.85 0.82 0.76  CALCIUM 8.5* 8.4* 8.6* 8.6*  MG  2.4  --   --   --    Liver Function Tests:  Recent Labs Lab 01/06/16 0723 01/08/16 0649 01/09/16 1047 01/10/16 0425  AST 17 48* 38 31  ALT 30 77* 78* 70*  ALKPHOS 51 51 53 45  BILITOT 0.4 0.4 0.2* 0.5   PROT 6.1* 5.6* 6.0* 5.4*  ALBUMIN 2.9* 2.3* 2.7* 2.6*    Recent Labs Lab 01/06/16 0723  LIPASE 18   No results for input(s): AMMONIA in the last 168 hours. CBC:  Recent Labs Lab 01/06/16 0723 01/08/16 0649 01/09/16 1047 01/10/16 0425  WBC 15.3* 10.4 9.8 8.6  HGB 12.8* 11.6* 12.1* 11.4*  HCT 38.6* 35.3* 36.9* 34.8*  MCV 92.8 92.7 93.7 91.8  PLT 166 196 242 210   Cardiac Enzymes:   No results for input(s): CKTOTAL, CKMB, CKMBINDEX, TROPONINI in the last 168 hours. BNP (last 3 results)  Recent Labs  01/05/16  BNP 102.5*    ProBNP (last 3 results) No results for input(s): PROBNP in the last 8760 hours.  CBG: No results for input(s): GLUCAP in the last 168 hours.  Recent Results (from the past 240 hour(s))  Blood Culture (routine x 2)     Status: None   Collection Time: 01/04/16 11:45 PM  Result Value Ref Range Status   Specimen Description BLOOD RIGHT FOREARM  Final   Special Requests BOTTLES DRAWN AEROBIC AND ANAEROBIC 5CC EACH  Final   Culture   Final    NO GROWTH 5 DAYS Performed at [facility]    Report Status 01/10/2016 FINAL  Final  Blood Culture (routine x 2)     Status: None   Collection Time: 01/05/16 12:00 AM  Result Value Ref Range Status   Specimen Description BLOOD LEFT HAND  Final   Special Requests BOTTLES DRAWN AEROBIC AND ANAEROBIC 5ML EACH  Final   Culture   Final    NO GROWTH 5 DAYS Performed at [facility]    Report Status 01/10/2016 FINAL  Final  Urine culture     Status: None   Collection Time: 01/05/16  1:15 AM  Result Value Ref Range Status   Specimen Description URINE, CLEAN CATCH  Final   Special Requests NONE  Final   Culture NO GROWTH Performed at [facility]   Final   Report Status 01/06/2016 FINAL  Final  MRSA PCR Screening     Status: None   Collection Time: 01/05/16  6:51 PM  Result Value Ref Range Status   MRSA by PCR NEGATIVE NEGATIVE Final    Comment:        The GeneXpert MRSA Assay  (FDA approved for NASAL specimens only), is one component of a comprehensive MRSA colonization surveillance program. It is not intended to diagnose MRSA infection nor to guide or monitor treatment for MRSA infections.      Studies: No results found.  Scheduled Meds:   Continuous Infusions:    Time spent:     Kelvin Cellar MD [facility] Pager [phone]. If 7PM-7AM, please contact night-coverage at [email or web address], password [facility] 01/12/2016, 5:35 PM  LOS: 5 days

## 2016-01-20 DIAGNOSIS — M722 Plantar fascial fibromatosis: Secondary | ICD-10-CM | POA: Diagnosis not present

## 2016-01-21 ENCOUNTER — Encounter: Payer: Self-pay | Admitting: Internal Medicine

## 2016-01-21 ENCOUNTER — Other Ambulatory Visit (INDEPENDENT_AMBULATORY_CARE_PROVIDER_SITE_OTHER): Payer: Medicare Other

## 2016-01-21 ENCOUNTER — Ambulatory Visit (INDEPENDENT_AMBULATORY_CARE_PROVIDER_SITE_OTHER): Payer: Medicare Other | Admitting: Internal Medicine

## 2016-01-21 VITALS — BP 124/72 | HR 58 | Temp 98.0°F | Resp 16 | Wt 245.0 lb

## 2016-01-21 DIAGNOSIS — E871 Hypo-osmolality and hyponatremia: Secondary | ICD-10-CM

## 2016-01-21 DIAGNOSIS — G40909 Epilepsy, unspecified, not intractable, without status epilepticus: Secondary | ICD-10-CM

## 2016-01-21 DIAGNOSIS — K2100 Gastro-esophageal reflux disease with esophagitis, without bleeding: Secondary | ICD-10-CM

## 2016-01-21 DIAGNOSIS — I10 Essential (primary) hypertension: Secondary | ICD-10-CM

## 2016-01-21 DIAGNOSIS — R7303 Prediabetes: Secondary | ICD-10-CM

## 2016-01-21 DIAGNOSIS — K810 Acute cholecystitis: Secondary | ICD-10-CM

## 2016-01-21 LAB — CBC WITH DIFFERENTIAL/PLATELET
Basophils Absolute: 0 10*3/uL (ref 0.0–0.1)
Basophils Relative: 0.3 % (ref 0.0–3.0)
Eosinophils Absolute: 0.1 10*3/uL (ref 0.0–0.7)
Eosinophils Relative: 0.8 % (ref 0.0–5.0)
HCT: 40.5 % (ref 39.0–52.0)
Hemoglobin: 13.5 g/dL (ref 13.0–17.0)
Lymphocytes Relative: 12.4 % (ref 12.0–46.0)
Lymphs Abs: 1.6 10*3/uL (ref 0.7–4.0)
MCHC: 33.4 g/dL (ref 30.0–36.0)
MCV: 90.9 fl (ref 78.0–100.0)
Monocytes Absolute: 1 10*3/uL (ref 0.1–1.0)
Monocytes Relative: 7.7 % (ref 3.0–12.0)
Neutro Abs: 9.9 10*3/uL — ABNORMAL HIGH (ref 1.4–7.7)
Neutrophils Relative %: 78.8 % — ABNORMAL HIGH (ref 43.0–77.0)
Platelets: 365 10*3/uL (ref 150.0–400.0)
RBC: 4.46 Mil/uL (ref 4.22–5.81)
RDW: 14 % (ref 11.5–15.5)
WBC: 12.5 10*3/uL — ABNORMAL HIGH (ref 4.0–10.5)

## 2016-01-21 LAB — COMPREHENSIVE METABOLIC PANEL
ALT: 47 U/L (ref 0–53)
AST: 17 U/L (ref 0–37)
Albumin: 4 g/dL (ref 3.5–5.2)
Alkaline Phosphatase: 61 U/L (ref 39–117)
BUN: 14 mg/dL (ref 6–23)
CO2: 30 mEq/L (ref 19–32)
Calcium: 9.6 mg/dL (ref 8.4–10.5)
Chloride: 99 mEq/L (ref 96–112)
Creatinine, Ser: 0.83 mg/dL (ref 0.40–1.50)
GFR: 96.89 mL/min (ref >60.00)
Glucose, Bld: 105 mg/dL — ABNORMAL HIGH (ref 70–99)
Potassium: 4.3 mEq/L (ref 3.5–5.1)
Sodium: 136 mEq/L (ref 135–145)
Total Bilirubin: 0.3 mg/dL (ref 0.2–1.2)
Total Protein: 7.1 g/dL (ref 6.0–8.3)

## 2016-01-21 LAB — HEMOGLOBIN A1C: Hgb A1c MFr Bld: 5.9 % (ref 4.6–6.5)

## 2016-01-21 LAB — TSH: TSH: 0.97 u[IU]/mL (ref 0.35–4.50)

## 2016-01-21 NOTE — Patient Instructions (Addendum)
  Test(s) ordered today. Your results will be released to MyChart (or called to you) after review, usually within 72hours after test completion. If any changes need to be made, you will be notified at that same time.  Medications reviewed and updated.  No changes recommended at this time.    Please followup in 6 months   

## 2016-01-21 NOTE — Assessment & Plan Note (Signed)
GERD controlled Continue daily medication  

## 2016-01-21 NOTE — Assessment & Plan Note (Signed)
BP well controlled Current regimen effective and well tolerated Continue current medications at current doses cmp  

## 2016-01-21 NOTE — Progress Notes (Signed)
Pre visit review using our clinic review tool, if applicable. No additional management support is needed unless otherwise documented below in the visit note. 

## 2016-01-21 NOTE — Assessment & Plan Note (Signed)
Will f/u with neuro Continue current meds - managed by neuro

## 2016-01-21 NOTE — Assessment & Plan Note (Signed)
S/p lap chole and doing well Has surgery follow up next week Will complete augmentin

## 2016-01-21 NOTE — Assessment & Plan Note (Signed)
Check a1c Work on increasing activity  Continue weight loss efforts

## 2016-01-21 NOTE — Progress Notes (Signed)
Subjective:    Patient ID: Jeff Weaver, male    DOB: 05/14/1946, 70 y.o.   MRN: [id number]  HPI He is here for follow up from the hospital.   01/04/16:  He went to the ED with fever and cough.  He had a seizure a few days prior and after that developed intermittent inattentiveness, nonproductive cough and generalized malaise. This progressed and he became more SOB, confused and weak.  His family brought him to the ER.  He was found be be febrile, in mild respiratory distress and hypotensive.  A CXR showed patchy opacities, likely pneumonia.  He was started on antibiotics.  He was diagnosed with sepsis from CAP an AKI.  He complained of abdominal pain and a Ct scan revealed acute cholecystitis.  Surgery was consulted and he underwent a laparoscopic cholecystectomy. It is thought his sepsis was due to acute cholecystitis rather than community acquired pneumonia. Repeat 2 view chest x-ray did not show evidence of infiltrate. After undergoing cholecystectomy his WBC decreased.  He did well after surgery.  He was discharged with two weeks of antibiotics, which he is still taking.    Since leaving the hospital he has felt pretty well.  He is getting stronger.  Hhis appetite is still decreased and he has lost some weight.  He has a follow up with surgery next week.  His surgical sites are healing well and he denies abdominal pain, distention and fever.  .    See Dr Joya Gaskins - left plantar fasciitis.  He followed up yesterday.    Hypertension: He is taking is medication daily. He is compliant with a low sodium diet.  He denies chest pain, palpitations, shortness of breath and regular headaches. He is not exercising regularly.    Prediabetes:  He is not always compliant with a low sugar/carbohydrate diet.  He is not exercising regularly.  GERD:  He is taking his medication daily as prescribed.  He denies any GERD symptoms and feels his GERD is well controlled.   Medications and allergies reviewed with  patient and updated if appropriate.  Patient Active Problem List   Diagnosis Date Noted  . Cholecystitis, acute 01/08/2016  . CAP (community acquired pneumonia) 01/05/2016  . Arterial hypotension 01/05/2016  . Sepsis ([place]) 01/05/2016  . Nasal congestion 10/26/2015  . Hyperglycemia 07/07/2015  . Cholelithiasis 05/02/2015  . GERD (gastroesophageal reflux disease) 07/14/2014  . PMR (polymyalgia rheumatica) ([place]) 05/28/2014  . Hyponatremia 05/28/2014  . Hypokalemia 05/28/2014  . 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 Elevated liver enzymes 05/28/2014  . Elevated troponin 11/27/2013  . NSTEMI (non-ST elevated myocardial infarction) ([place]) 11/25/2013  . SBE (subacute bacterial endocarditis) prophylaxis candidate 11/24/2013  . Pre-diabetes 03/26/2013  . Epigastric discomfort 01/08/2013  . Edema 01/08/2013  . Seizure disorder ([place]) 07/23/2012  . Cerebral aneurysm 11/23/2011  . OSA (obstructive sleep apnea) 08/09/2011  . Other and unspecified hyperlipidemia 02/03/2009  . Essential hypertension 02/03/2009  . First degree atrioventricular block 02/03/2009  . OSTEOARTHRITIS 02/03/2009  . REGIONAL ENTERITIS, LARGE INTESTINE 04/17/2008    Current Outpatient Prescriptions on File Prior to Visit  Medication Sig Dispense Refill  . amoxicillin-clavulanate (AUGMENTIN) 875-125 MG tablet Take 1 tablet by mouth 2 (two) times daily. 28 tablet 0  . blood glucose meter kit and supplies KIT Dispense based on patient and insurance preference. Use up to four times daily as directed. (prediabetes hypoglycemia). 1 each 0  . cetirizine (ZYRTEC) 10 MG tablet Take 1 tablet (10 mg total) by  mouth daily. 30 tablet 11  . Eslicarbazepine Acetate 400 MG TABS Take 600 mg by mouth 2 (two) times daily. Take 1.5 in AM and PM    . fluticasone (FLONASE) 50 MCG/ACT nasal spray Place 1 spray into both nostrils 2 (two) times daily. 16 g 5  . Lacosamide (VIMPAT) 100 MG TABS Take 300 mg by mouth 2 (two) times daily. 1 tablets in the  am 2 in the PM    . levETIRAcetam (KEPPRA) 500 MG tablet Take 250 mg by mouth 2 (two) times daily.     Marland Kitchen losartan (COZAAR) 50 MG tablet Take 1 tablet (50 mg total) by mouth daily. 90 tablet 3  . Melatonin 3 MG TABS Take 3 mg by mouth at bedtime.    . mirabegron ER (MYRBETRIQ) 25 MG TB24 tablet Take 25 mg by mouth.    . Multiple Vitamins-Minerals (MULTIVITAMIN) tablet Take 1 tablet by mouth daily.    Marland Kitchen NIFEdipine (PROCARDIA-XL/ADALAT-CC/NIFEDICAL-XL) 30 MG 24 hr tablet Take 1 tablet (30 mg total) by mouth daily. 90 tablet 3  . NON FORMULARY CPAP Machine: authorized by [place] in [place]    . pyridoxine (B-6) 100 MG tablet Take 100 mg by mouth daily.    . ranitidine (ZANTAC) 150 MG capsule TAKE 1 CAPSULE EVERY MORNING 90 capsule 1  . Tamsulosin HCl (FLOMAX) 0.4 MG CAPS Take 0.8 mg by mouth at bedtime.     . thiamine 100 MG tablet Take 100 mg by mouth daily.     No current facility-administered medications on file prior to visit.    Past Medical History  Diagnosis Date  . Hyperlipidemia   . HTN (hypertension)   . Enteritis (regional)     Dr Olevia Perches  . Rheumatoid arthritis(714.0)     dxed in [employer] in 1980s  . Sleep apnea   . Cerebral aneurysm   . CVA (cerebral infarction)   . Seizures ([place])   . Diverticulosis   . GI bleed   . IBS (irritable bowel syndrome)   . Regional enteritis of large intestine ([place]) since 1978    Past Surgical History  Procedure Laterality Date  . Nose surgery    . Colonoscopy w/ polypectomy  1978    negative 2009, Dr Olevia Perches. Due 2014  . Shoulder surgery  1997  . Hernia repair    . Tonsillectomy and adenoidectomy    . Craniotomy  02/02/12    Dr Harvel Ricks, [place] of aneurysm  . Cns shunt  02/23/12  . 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 Ventriculoperitoneal shunt  02-23-12    INSERTION OF RIGHT FRONTAL VENTRICULOPERITONEAL SHUNT WITH A CODMAN HAKIM PROGRAMMABLE VALVE  . Left heart  catheterization with coronary angiogram N/A 11/26/2013    Procedure: LEFT HEART CATHETERIZATION WITH CORONARY ANGIOGRAM;  Surgeon: [name], MD;  [facility] C[facility] LAB;  Service: Cardiovascular;  Laterality: N/A;  . Cholecystectomy N/A 01/07/2016    Procedure: LAPAROSCOPIC CHOLECYSTECTOMY ;  Surgeon: Coralie Keens, MD;  Location: [place];  Service: General;  Laterality: N/A;    Social History   Social History  . Marital Status: Married    Spouse Name: N/A  . Number of Children: 1  . Years of Education: N/A   Occupational History  . retired    Social History Main Topics  . Smoking status: Former Smoker    Quit date: 07/04/2009  . Smokeless tobacco: Never Used  . Alcohol Use: 0.0  oz/week    0 Standard drinks or equivalent per week     Comment: Very Infrequently   . Drug Use: No  . Sexual Activity: Not on file   Other Topics Concern  . Not on file   Social History Narrative    Family History  Problem Relation Age of Onset  . COPD Father   . Coronary artery disease Father     MI in 12s  . Hepatitis Mother   . Diabetes Sister   . Dementia Sister 39  . Diabetes Brother   . Colon cancer Neg Hx     Review of Systems  Constitutional: Positive for appetite change (slightly decreased). Negative for fever and chills.  Respiratory: Positive for cough (in morning only). Negative for shortness of breath and wheezing.   Cardiovascular: Positive for leg swelling (mild, lasix stopped). Negative for chest pain and palpitations.  Gastrointestinal: Negative for nausea, abdominal pain, diarrhea, constipation, blood in stool and abdominal distention.       No gerd  Neurological: Negative for dizziness, light-headedness and headaches.       Objective:   Filed Vitals:   01/21/16 1019  BP: 124/72  Pulse: 58  Temp: 98 F (36.7 C)  Resp: 16   Filed Weights   01/21/16 1019  Weight: 245 lb (111.131 kg)   Body mass index is 37.26 kg/(m^2).   Physical Exam    Constitutional: He appears well-developed and well-nourished. No distress.  HENT:  Head: Normocephalic and atraumatic.  Eyes: Conjunctivae are normal.  Neck: Neck supple. No tracheal deviation present. No thyromegaly present.  Cardiovascular: Normal rate, regular rhythm and normal heart sounds.   Pulmonary/Chest: Effort normal and breath sounds normal. No respiratory distress. He has no wheezes. He has no rales.  Abdominal: Soft. He exhibits no distension. There is no tenderness. There is no rebound and no guarding.  Surgical sites healing well, no surrounding erythema or discharge, nontender  Musculoskeletal: He exhibits edema (1+ non-pitting).  Lymphadenopathy:    He has no cervical adenopathy.  Skin: Skin is warm and dry. He is not diaphoretic.  Psychiatric: He has a normal mood and affect. His behavior is normal.          Assessment & Plan:   See Problem List for Assessment and Plan of chronic medical problems.   F/u in 6 months

## 2016-01-22 ENCOUNTER — Encounter: Payer: Self-pay | Admitting: Internal Medicine

## 2016-01-22 DIAGNOSIS — D72829 Elevated white blood cell count, unspecified: Secondary | ICD-10-CM

## 2016-01-25 ENCOUNTER — Other Ambulatory Visit (INDEPENDENT_AMBULATORY_CARE_PROVIDER_SITE_OTHER): Payer: Medicare Other

## 2016-01-25 ENCOUNTER — Ambulatory Visit: Payer: Medicare Other | Admitting: Internal Medicine

## 2016-01-25 DIAGNOSIS — D72829 Elevated white blood cell count, unspecified: Secondary | ICD-10-CM | POA: Diagnosis not present

## 2016-01-25 LAB — CBC WITH DIFFERENTIAL/PLATELET
Basophils Absolute: 0 10*3/uL (ref 0.0–0.1)
Basophils Relative: 0.3 % (ref 0.0–3.0)
Eosinophils Absolute: 0.3 10*3/uL (ref 0.0–0.7)
Eosinophils Relative: 4.3 % (ref 0.0–5.0)
HCT: 39.3 % (ref 39.0–52.0)
Hemoglobin: 13.1 g/dL (ref 13.0–17.0)
Lymphocytes Relative: 18 % (ref 12.0–46.0)
Lymphs Abs: 1.3 10*3/uL (ref 0.7–4.0)
MCHC: 33.3 g/dL (ref 30.0–36.0)
MCV: 90.3 fl (ref 78.0–100.0)
Monocytes Absolute: 0.5 10*3/uL (ref 0.1–1.0)
Monocytes Relative: 6.6 % (ref 3.0–12.0)
Neutro Abs: 5.1 10*3/uL (ref 1.4–7.7)
Neutrophils Relative %: 70.8 % (ref 43.0–77.0)
Platelets: 258 10*3/uL (ref 150.0–400.0)
RBC: 4.35 Mil/uL (ref 4.22–5.81)
RDW: 13.9 % (ref 11.5–15.5)
WBC: 7.2 10*3/uL (ref 4.0–10.5)

## 2016-01-25 NOTE — Progress Notes (Signed)
Britton's repeat CBC is completely normal.  No further testing is needed.   Let me know if you have any questions or concerns.   Dr. Billey Gosling.

## 2016-01-26 ENCOUNTER — Encounter: Payer: Self-pay | Admitting: Internal Medicine

## 2016-02-08 DIAGNOSIS — Z79899 Other long term (current) drug therapy: Secondary | ICD-10-CM | POA: Diagnosis not present

## 2016-02-08 DIAGNOSIS — G40209 Localization-related (focal) (partial) symptomatic epilepsy and epileptic syndromes with complex partial seizures, not intractable, without status epilepticus: Secondary | ICD-10-CM | POA: Diagnosis not present

## 2016-02-08 DIAGNOSIS — E785 Hyperlipidemia, unspecified: Secondary | ICD-10-CM | POA: Diagnosis not present

## 2016-02-08 DIAGNOSIS — R351 Nocturia: Secondary | ICD-10-CM | POA: Diagnosis not present

## 2016-02-08 DIAGNOSIS — I10 Essential (primary) hypertension: Secondary | ICD-10-CM | POA: Diagnosis not present

## 2016-02-08 DIAGNOSIS — Z87891 Personal history of nicotine dependence: Secondary | ICD-10-CM | POA: Diagnosis not present

## 2016-02-08 DIAGNOSIS — R3915 Urgency of urination: Secondary | ICD-10-CM | POA: Diagnosis not present

## 2016-02-08 DIAGNOSIS — Z9889 Other specified postprocedural states: Secondary | ICD-10-CM | POA: Diagnosis not present

## 2016-02-08 DIAGNOSIS — G40109 Localization-related (focal) (partial) symptomatic epilepsy and epileptic syndromes with simple partial seizures, not intractable, without status epilepticus: Secondary | ICD-10-CM | POA: Diagnosis not present

## 2016-02-08 DIAGNOSIS — Z87442 Personal history of urinary calculi: Secondary | ICD-10-CM | POA: Diagnosis not present

## 2016-02-08 DIAGNOSIS — G4733 Obstructive sleep apnea (adult) (pediatric): Secondary | ICD-10-CM | POA: Diagnosis not present

## 2016-02-08 DIAGNOSIS — N401 Enlarged prostate with lower urinary tract symptoms: Secondary | ICD-10-CM | POA: Diagnosis not present

## 2016-02-08 DIAGNOSIS — Z888 Allergy status to other drugs, medicaments and biological substances status: Secondary | ICD-10-CM | POA: Diagnosis not present

## 2016-02-08 DIAGNOSIS — Z9119 Patient's noncompliance with other medical treatment and regimen: Secondary | ICD-10-CM | POA: Diagnosis not present

## 2016-02-08 DIAGNOSIS — Z9989 Dependence on other enabling machines and devices: Secondary | ICD-10-CM | POA: Diagnosis not present

## 2016-02-08 DIAGNOSIS — Z982 Presence of cerebrospinal fluid drainage device: Secondary | ICD-10-CM | POA: Diagnosis not present

## 2016-02-10 ENCOUNTER — Ambulatory Visit (INDEPENDENT_AMBULATORY_CARE_PROVIDER_SITE_OTHER): Payer: Medicare Other | Admitting: Podiatry

## 2016-02-10 ENCOUNTER — Ambulatory Visit (INDEPENDENT_AMBULATORY_CARE_PROVIDER_SITE_OTHER): Payer: Medicare Other

## 2016-02-10 ENCOUNTER — Encounter: Payer: Self-pay | Admitting: Podiatry

## 2016-02-10 VITALS — BP 138/76 | HR 58

## 2016-02-10 DIAGNOSIS — B351 Tinea unguium: Secondary | ICD-10-CM | POA: Diagnosis not present

## 2016-02-10 DIAGNOSIS — M79605 Pain in left leg: Secondary | ICD-10-CM

## 2016-02-10 DIAGNOSIS — M79604 Pain in right leg: Secondary | ICD-10-CM

## 2016-02-10 DIAGNOSIS — M79672 Pain in left foot: Secondary | ICD-10-CM

## 2016-02-10 DIAGNOSIS — M722 Plantar fascial fibromatosis: Secondary | ICD-10-CM | POA: Diagnosis not present

## 2016-02-10 DIAGNOSIS — M79671 Pain in right foot: Secondary | ICD-10-CM

## 2016-02-10 MED ORDER — TRIAMCINOLONE ACETONIDE 10 MG/ML IJ SUSP
10.0000 mg | Freq: Once | INTRAMUSCULAR | Status: AC
  Administered 2016-02-10: 10 mg

## 2016-02-11 NOTE — Progress Notes (Signed)
Subjective:     Patient ID: Jeff Weaver, male   DOB: 01-21-1946, 70 y.o.   MRN: [id number]  HPI patient presents stating that he's having a lot of pain in his left heel and he's had 2 previous injections which only gave him very temporary relief of symptoms but no other treatment. States that the pain is quite severe when he tries to be active. He also states that his nails are thick and he cannot cut them himself and they are increasingly difficult to wear shoe gear with   Review of Systems  All other systems reviewed and are negative.      Objective:   Physical Exam  Constitutional: He is oriented to person, place, and time.  Cardiovascular: Intact distal pulses.   Musculoskeletal: Normal range of motion.  Neurological: He is oriented to person, place, and time.  Skin: Skin is warm and dry.  Nursing note and vitals reviewed.  neurovascular status intact muscle strength was found to be adequate with range of motion within normal limits with mild to moderate equinus condition noted. Patient's found to have exquisite discomfort left plantar fashion insertional point into the tendon calcaneus with moderate depression of the arch noted and fluid buildup around the insertion. He states pain is worse after periods sitting when getting up in the morning and when he is on his foot for any length of time. Patient is noted to have thick yellow brittle nailbeds 1-5 both feet that are also painful and he cannot cut     Assessment:     Acute plantar fasciitis left it's not responded so far to cortisone treatment. Mycotic nail infection with pain 1-5 both feet    Plan:     H&P x-rays reviewed and I spent a great of time educating him on this deformity. At this time I went ahead and I injected the plantar fascial left 3 mg Kenalog 5 mg Xylocaine and gave instructions on physical therapy and dispensed night splint and fascial brace for daytime usage. I also went ahead and debrided his painful nailbeds  1-5 both feet with no iatrogenic bleeding

## 2016-02-12 DIAGNOSIS — Z982 Presence of cerebrospinal fluid drainage device: Secondary | ICD-10-CM | POA: Insufficient documentation

## 2016-02-12 DIAGNOSIS — Z4541 Encounter for adjustment and management of cerebrospinal fluid drainage device: Secondary | ICD-10-CM | POA: Diagnosis not present

## 2016-02-12 DIAGNOSIS — Z9889 Other specified postprocedural states: Secondary | ICD-10-CM | POA: Diagnosis not present

## 2016-02-12 DIAGNOSIS — G40209 Localization-related (focal) (partial) symptomatic epilepsy and epileptic syndromes with complex partial seizures, not intractable, without status epilepticus: Secondary | ICD-10-CM | POA: Diagnosis not present

## 2016-02-12 DIAGNOSIS — G939 Disorder of brain, unspecified: Secondary | ICD-10-CM | POA: Diagnosis not present

## 2016-02-15 ENCOUNTER — Telehealth: Payer: Self-pay | Admitting: Emergency Medicine

## 2016-02-15 DIAGNOSIS — R410 Disorientation, unspecified: Secondary | ICD-10-CM

## 2016-02-15 DIAGNOSIS — F05 Delirium due to known physiological condition: Principal | ICD-10-CM

## 2016-02-15 NOTE — Telephone Encounter (Signed)
Should have blood work/urine tests and cxr done asap.   ordered

## 2016-02-15 NOTE — Telephone Encounter (Signed)
Pt aware.

## 2016-02-15 NOTE — Telephone Encounter (Signed)
Please advise if pt will need any lab work.

## 2016-02-15 NOTE — Telephone Encounter (Signed)
Pts wife states she thinks husband has an infection and is causing him to be confused. Usually when he gets like this it is an infection. Would you like him to come in for blood work before his appt on 8/18. Please call and follow up with her. Thanks.

## 2016-02-15 NOTE — Telephone Encounter (Signed)
LVM for pts wife.

## 2016-02-16 ENCOUNTER — Other Ambulatory Visit (INDEPENDENT_AMBULATORY_CARE_PROVIDER_SITE_OTHER): Payer: Medicare Other

## 2016-02-16 ENCOUNTER — Ambulatory Visit (INDEPENDENT_AMBULATORY_CARE_PROVIDER_SITE_OTHER)
Admission: RE | Admit: 2016-02-16 | Discharge: 2016-02-16 | Disposition: A | Discharge status: Home / Self Care | Payer: Medicare Other | Source: Ambulatory Visit | Attending: Internal Medicine | Admitting: Internal Medicine

## 2016-02-16 DIAGNOSIS — R410 Disorientation, unspecified: Secondary | ICD-10-CM

## 2016-02-16 DIAGNOSIS — F05 Delirium due to known physiological condition: Secondary | ICD-10-CM

## 2016-02-16 DIAGNOSIS — Z09 Encounter for follow-up examination after completed treatment for conditions other than malignant neoplasm: Secondary | ICD-10-CM | POA: Diagnosis not present

## 2016-02-16 LAB — COMPREHENSIVE METABOLIC PANEL
ALT: 27 U/L (ref 0–53)
AST: 15 U/L (ref 0–37)
Albumin: 4 g/dL (ref 3.5–5.2)
Alkaline Phosphatase: 48 U/L (ref 39–117)
BUN: 22 mg/dL (ref 6–23)
CO2: 31 mEq/L (ref 19–32)
Calcium: 9.6 mg/dL (ref 8.4–10.5)
Chloride: 101 mEq/L (ref 96–112)
Creatinine, Ser: 1.12 mg/dL (ref 0.40–1.50)
GFR: 68.55 mL/min (ref >60.00)
Glucose, Bld: 124 mg/dL — ABNORMAL HIGH (ref 70–99)
Potassium: 4.5 mEq/L (ref 3.5–5.1)
Sodium: 136 mEq/L (ref 135–145)
Total Bilirubin: 0.3 mg/dL (ref 0.2–1.2)
Total Protein: 6.4 g/dL (ref 6.0–8.3)

## 2016-02-16 LAB — CBC WITH DIFFERENTIAL/PLATELET
Basophils Absolute: 0 10*3/uL (ref 0.0–0.1)
Basophils Relative: 0.1 % (ref 0.0–3.0)
Eosinophils Absolute: 0.1 10*3/uL (ref 0.0–0.7)
Eosinophils Relative: 1.3 % (ref 0.0–5.0)
HCT: 39.6 % (ref 39.0–52.0)
Hemoglobin: 13.4 g/dL (ref 13.0–17.0)
Lymphocytes Relative: 18 % (ref 12.0–46.0)
Lymphs Abs: 1.4 10*3/uL (ref 0.7–4.0)
MCHC: 33.8 g/dL (ref 30.0–36.0)
MCV: 90.3 fl (ref 78.0–100.0)
Monocytes Absolute: 0.6 10*3/uL (ref 0.1–1.0)
Monocytes Relative: 8.1 % (ref 3.0–12.0)
Neutro Abs: 5.8 10*3/uL (ref 1.4–7.7)
Neutrophils Relative %: 72.5 % (ref 43.0–77.0)
Platelets: 205 10*3/uL (ref 150.0–400.0)
RBC: 4.38 Mil/uL (ref 4.22–5.81)
RDW: 14.2 % (ref 11.5–15.5)
WBC: 8 10*3/uL (ref 4.0–10.5)

## 2016-02-16 LAB — URINALYSIS, ROUTINE W REFLEX MICROSCOPIC
Bilirubin Urine: NEGATIVE
Hgb urine dipstick: NEGATIVE
Ketones, ur: NEGATIVE
Leukocytes, UA: NEGATIVE
Nitrite: NEGATIVE
Specific Gravity, Urine: 1.025 (ref 1.000–1.030)
Urine Glucose: NEGATIVE
Urobilinogen, UA: 0.2 (ref 0.0–1.0)
pH: 6 (ref 5.0–8.0)

## 2016-02-19 ENCOUNTER — Ambulatory Visit: Payer: Medicare Other | Admitting: Internal Medicine

## 2016-02-24 ENCOUNTER — Ambulatory Visit (INDEPENDENT_AMBULATORY_CARE_PROVIDER_SITE_OTHER): Payer: Medicare Other | Admitting: Podiatry

## 2016-02-24 VITALS — BP 106/58 | HR 62

## 2016-02-24 DIAGNOSIS — M722 Plantar fascial fibromatosis: Secondary | ICD-10-CM

## 2016-02-26 ENCOUNTER — Other Ambulatory Visit: Payer: Self-pay

## 2016-03-03 DIAGNOSIS — H25011 Cortical age-related cataract, right eye: Secondary | ICD-10-CM | POA: Diagnosis not present

## 2016-03-03 DIAGNOSIS — H2511 Age-related nuclear cataract, right eye: Secondary | ICD-10-CM | POA: Diagnosis not present

## 2016-03-03 NOTE — Progress Notes (Signed)
patient states there is been some improvement but still quite a bit of pain with palpation and with ambulation   Review of Systems   All other systems negative Objective:  Physical Exam Neurovascular status found to be intact muscle strength adequate range of motion within normal limits with patient found to have inflammatory changes still noted but improved from previous visit  Assessment:   Gradual improvement of fasciitis-like symptoms  Plan:   Reviewed condition discussing all treatment options and at this point were to try to utilize physical therapy anti-inflammatories supportive shoe and not going barefoot. Reappoint as needed

## 2016-03-13 ENCOUNTER — Telehealth: Payer: Self-pay | Admitting: Internal Medicine

## 2016-03-13 DIAGNOSIS — I214 Non-ST elevation (NSTEMI) myocardial infarction: Secondary | ICD-10-CM

## 2016-03-13 DIAGNOSIS — R413 Other amnesia: Secondary | ICD-10-CM | POA: Insufficient documentation

## 2016-03-13 DIAGNOSIS — M353 Polymyalgia rheumatica: Secondary | ICD-10-CM

## 2016-03-13 DIAGNOSIS — R7303 Prediabetes: Secondary | ICD-10-CM

## 2016-03-13 DIAGNOSIS — I10 Essential (primary) hypertension: Secondary | ICD-10-CM

## 2016-03-13 DIAGNOSIS — R410 Disorientation, unspecified: Secondary | ICD-10-CM | POA: Insufficient documentation

## 2016-03-13 NOTE — Telephone Encounter (Signed)
Call his wife.  I have reviewed her notes.  I have referred him to cardiology.   He should see me and neurology.  I want him to have blood work and urine tests prior to seeing me (ordered - does not need to fast.  A lot of his symptoms are memory/confusion related which is more neurological - we will rule out some medical causes.  Medication could be the cause of some of this.     Make appt with me 30 minutes.

## 2016-03-14 NOTE — Telephone Encounter (Signed)
Spoke with pts wife, appt scheduled.

## 2016-03-15 ENCOUNTER — Other Ambulatory Visit (INDEPENDENT_AMBULATORY_CARE_PROVIDER_SITE_OTHER): Payer: Medicare Other

## 2016-03-15 DIAGNOSIS — R410 Disorientation, unspecified: Secondary | ICD-10-CM

## 2016-03-15 DIAGNOSIS — R7303 Prediabetes: Secondary | ICD-10-CM | POA: Diagnosis not present

## 2016-03-15 DIAGNOSIS — M353 Polymyalgia rheumatica: Secondary | ICD-10-CM | POA: Diagnosis not present

## 2016-03-15 DIAGNOSIS — R413 Other amnesia: Secondary | ICD-10-CM | POA: Diagnosis not present

## 2016-03-15 DIAGNOSIS — I10 Essential (primary) hypertension: Secondary | ICD-10-CM | POA: Diagnosis not present

## 2016-03-15 LAB — URINALYSIS, ROUTINE W REFLEX MICROSCOPIC
Nitrite: NEGATIVE
Specific Gravity, Urine: 1.025 (ref 1.000–1.030)
Total Protein, Urine: 30 — AB
Urine Glucose: NEGATIVE
Urobilinogen, UA: 0.2 (ref 0.0–1.0)
pH: 6 (ref 5.0–8.0)

## 2016-03-15 LAB — COMPREHENSIVE METABOLIC PANEL
ALT: 33 U/L (ref 0–53)
AST: 20 U/L (ref 0–37)
Albumin: 4.1 g/dL (ref 3.5–5.2)
Alkaline Phosphatase: 48 U/L (ref 39–117)
BUN: 17 mg/dL (ref 6–23)
CO2: 34 mEq/L — ABNORMAL HIGH (ref 19–32)
Calcium: 9.2 mg/dL (ref 8.4–10.5)
Chloride: 98 mEq/L (ref 96–112)
Creatinine, Ser: 0.92 mg/dL (ref 0.40–1.50)
GFR: 86 mL/min (ref >60.00)
Glucose, Bld: 102 mg/dL — ABNORMAL HIGH (ref 70–99)
Potassium: 4.4 mEq/L (ref 3.5–5.1)
Sodium: 134 mEq/L — ABNORMAL LOW (ref 135–145)
Total Bilirubin: 0.3 mg/dL (ref 0.2–1.2)
Total Protein: 6.7 g/dL (ref 6.0–8.3)

## 2016-03-15 LAB — CBC WITH DIFFERENTIAL/PLATELET
Basophils Absolute: 0 10*3/uL (ref 0.0–0.1)
Basophils Relative: 0.2 % (ref 0.0–3.0)
Eosinophils Absolute: 0.1 10*3/uL (ref 0.0–0.7)
Eosinophils Relative: 2 % (ref 0.0–5.0)
HCT: 42.4 % (ref 39.0–52.0)
Hemoglobin: 14.5 g/dL (ref 13.0–17.0)
Lymphocytes Relative: 14.9 % (ref 12.0–46.0)
Lymphs Abs: 1.1 10*3/uL (ref 0.7–4.0)
MCHC: 34.3 g/dL (ref 30.0–36.0)
MCV: 90 fl (ref 78.0–100.0)
Monocytes Absolute: 0.6 10*3/uL (ref 0.1–1.0)
Monocytes Relative: 7.7 % (ref 3.0–12.0)
Neutro Abs: 5.7 10*3/uL (ref 1.4–7.7)
Neutrophils Relative %: 75.2 % (ref 43.0–77.0)
Platelets: 195 10*3/uL (ref 150.0–400.0)
RBC: 4.71 Mil/uL (ref 4.22–5.81)
RDW: 13.9 % (ref 11.5–15.5)
WBC: 7.6 10*3/uL (ref 4.0–10.5)

## 2016-03-15 LAB — TSH: TSH: 1.29 u[IU]/mL (ref 0.35–4.50)

## 2016-03-15 LAB — VITAMIN B12: Vitamin B-12: 286 pg/mL (ref 211–911)

## 2016-03-15 LAB — C-REACTIVE PROTEIN: CRP: 0.3 mg/dL — ABNORMAL LOW (ref 0.5–20.0)

## 2016-03-15 LAB — HEMOGLOBIN A1C: Hgb A1c MFr Bld: 5.7 % (ref 4.6–6.5)

## 2016-03-15 LAB — SEDIMENTATION RATE: Sed Rate: 13 mm/hr (ref 0–20)

## 2016-03-15 NOTE — Progress Notes (Signed)
Subjective:    Patient ID: Jeff Weaver, male    DOB: [birth year], 70 y.o.   MRN: [id number]  HPI The patient is here because he is not feeling well.  His wife is here with him.  He has not felt well for a while - the beginning of July of this year he had confusion, fever and low BP.  They thought he had pneumonia and was admitted.  Ct of head, ct of abdomen, xrays, blood work and Echo/ekg all done.  He was found to have cholecystitis and his gallbladder was removed.  He did get better, but has not been well since then.  Some of his current symptoms predate that hospitalization.      Difficulty sleeping even with melatonin. He has difficulty falling asleep. He sleeps 1-2 hours then is awake.  He wakes when he has to urinate and has difficulty falling back asleep.  Gets up 1-2 times a night and snacks.       He has a history of PMR and was on prednisone, which helped at the time. He has been having leg weakness.    Confusion:  He has seen things that are not actually there.  He talks about things that do not make sense.  He gets distracted - has left stove on and went outside and talked to neighbor for 30 minutes.  He has put things were they do not belong.  He has left things where they do not belong.  He has fallen asleep in the chair and says he needs to go to work when he wakes up (he no longer works).  He has seen food in a chair but there was none there.  He sometimes is not sure what day it is or where he is.    He is starring more, mostly at night.   He says several concerning things about himself, such as - my head is all messed up, Im angry all the time, I cant remember things, taking me longer to get my thoughts together, I want to say but cant get the right word, he has also said he thought he was dying.    He has a loss of energy.  He is sleepy. He has poor hearing.    He has poor balance.  He is having difficulty getting up and when he stands up he is unbalanced.  This has been  going on for months and getting worse.  He staggers when he walks.  He is not picking his feet up when he walks.  He is walking slower.    He had blood work and urine tests prior to coming her today. His wife has tried to get him in with his neurologist, but they are not able to see her. She has called her with these concerns and she has not heard back.  He he was having seizures approximately 2 times a year, but she wonders if some of these symptoms are related to seizures. She wonders if some of these symptoms are related to side effects.  Medications and allergies reviewed with patient and updated if appropriate.  Patient Active Problem List   Diagnosis Date Noted  . Memory difficulties 03/13/2016  . Confusion 03/13/2016  . Cholecystitis, acute 01/08/2016  . CAP (community acquired pneumonia) 01/05/2016  . Arterial hypotension 01/05/2016  . Sepsis ([place]) 01/05/2016  . GERD (gastroesophageal reflux disease) 07/14/2014  . PMR (polymyalgia rheumatica) ([place]) 05/28/2014  . 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 Elevated  liver enzymes 05/28/2014  . NSTEMI (non-ST elevated myocardial infarction) ([facility]) 11/25/2013  . SBE (subacute bacterial endocarditis) prophylaxis candidate 11/24/2013  . Pre-diabetes 03/26/2013  . Epigastric discomfort 01/08/2013  . Edema 01/08/2013  . Seizure disorder ([facility]) 07/23/2012  . Cerebral aneurysm 11/23/2011  . OSA (obstructive sleep apnea) 08/09/2011  . Other and unspecified hyperlipidemia 02/03/2009  . Essential hypertension 02/03/2009  . First degree atrioventricular block 02/03/2009  . OSTEOARTHRITIS 02/03/2009  . REGIONAL ENTERITIS, LARGE INTESTINE 04/17/2008    Current Outpatient Prescriptions on File Prior to Visit  Medication Sig Dispense Refill  . blood glucose meter kit and supplies KIT Dispense based on patient and insurance preference. Use up to four times daily as directed. (prediabetes hypoglycemia). 1 each 0  . cetirizine (ZYRTEC) 10 MG tablet Take 1  tablet (10 mg total) by mouth daily. 30 tablet 11  . Eslicarbazepine Acetate 400 MG TABS Take 600 mg by mouth 2 (two) times daily. Take 1.5 in AM and PM    . fluticasone (FLONASE) 50 MCG/ACT nasal spray Place 1 spray into both nostrils 2 (two) times daily. 16 g 5  . Lacosamide (VIMPAT) 100 MG TABS Take 300 mg by mouth 2 (two) times daily. 1 tablets in the am 2 in the PM    . levETIRAcetam (KEPPRA) 500 MG tablet Take 250 mg by mouth 2 (two) times daily.     . losartan (COZAAR) 50 MG tablet Take 1 tablet (50 mg total) by mouth daily. 90 tablet 3  . Melatonin 3 MG TABS Take 3 mg by mouth at bedtime.    . mirabegron ER (MYRBETRIQ) 25 MG TB24 tablet Take 25 mg by mouth.    . Multiple Vitamins-Minerals (MULTIVITAMIN) tablet Take 1 tablet by mouth daily.    . NIFEdipine (PROCARDIA-XL/ADALAT-CC/NIFEDICAL-XL) 30 MG 24 hr tablet Take 1 tablet (30 mg total) by mouth daily. 90 tablet 3  . NON FORMULARY CPAP Machine: authorized by [facility] in [place]    . pyridoxine (B-6) 100 MG tablet Take 100 mg by mouth daily.    . ranitidine (ZANTAC) 150 MG capsule TAKE 1 CAPSULE EVERY MORNING 90 capsule 1  . Tamsulosin HCl (FLOMAX) 0.4 MG CAPS Take 0.8 mg by mouth at bedtime.     . thiamine 100 MG tablet Take 100 mg by mouth daily.     No current facility-administered medications on file prior to visit.     Past Medical History:  Diagnosis Date  . Cerebral aneurysm   . CVA (cerebral infarction)   . Diverticulosis   . Enteritis (regional)    Dr Brodie  . GI bleed   . HTN (hypertension)   . Hyperlipidemia   . IBS (irritable bowel syndrome)   . Regional enteritis of large intestine ([facility]) since 1978  . Rheumatoid arthritis(714.0)    dxed in [employer] in 1980s  . Seizures ([facility])   . Sleep apnea     Past Surgical History:  Procedure Laterality Date  . CHOLECYSTECTOMY N/A 01/07/2016   Procedure: LAPAROSCOPIC CHOLECYSTECTOMY ;  Surgeon: Douglas Blackman, MD;  Location: [facility] OR;  Service: General;  Laterality: N/A;  . cns  shunt  02/23/12  . COLONOSCOPY W/ POLYPECTOMY  1978   negative 2009, Dr Brodie. Due 2014  . CRANIOTOMY  02/02/12   Dr Janjua, [facility]-clipping of aneurysm  . 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 HERNIA REPAIR    . LEFT HEART CATHETERIZATION WITH CORONARY ANGIOGRAM N/A 11/26/2013     Procedure: LEFT HEART CATHETERIZATION WITH CORONARY ANGIOGRAM;  Surgeon: David W Harding, MD;  Location: [facility] CATH LAB;  Service: Cardiovascular;  Laterality: N/A;  . NOSE SURGERY    . SHOULDER SURGERY  1997  . TONSILLECTOMY AND ADENOIDECTOMY    . VENTRICULOPERITONEAL SHUNT  02-23-12   INSERTION OF RIGHT FRONTAL VENTRICULOPERITONEAL SHUNT WITH A CODMAN HAKIM PROGRAMMABLE VALVE    Social History   Social History  . Marital status: Married    Spouse name: N/A  . Number of children: 1  . Years of education: N/A   Occupational History  . retired    Social History Main Topics  . Smoking status: Former Smoker    Quit date: 07/04/2009  . Smokeless tobacco: Never Used  . Alcohol use 0.0 oz/week     Comment: Very Infrequently   . Drug use: No  . Sexual activity: Not on file   Other Topics Concern  . Not on file   Social History Narrative  . No narrative on file    Family History  Problem Relation Age of Onset  . COPD Father   . Coronary artery disease Father     MI in 70s  . Hepatitis Mother   . Diabetes Sister   . Dementia Sister 70  . Diabetes Brother   . Colon cancer Neg Hx     Review of Systems  Constitutional: Positive for appetite change (decreased). Negative for fever.  Respiratory: Positive for shortness of breath (if walking long periods of time). Negative for cough and wheezing.   Cardiovascular: Positive for chest pain (occasionally, transient). Negative for palpitations and leg swelling.  Gastrointestinal: Negative for abdominal pain, blood in stool, constipation, diarrhea and nausea.  Genitourinary: Negative for  difficulty urinating, dysuria, frequency and hematuria.  Musculoskeletal: Positive for gait problem (difficult to get up and very unblanced).  Neurological: Positive for dizziness, weakness (legs, not arms) and light-headedness. Negative for numbness and headaches.  Psychiatric/Behavioral: Positive for confusion (he says no, wife says yes and has many examples) and dysphoric mood (sometimes). The patient is nervous/anxious (a little).        Objective:   Vitals:   03/16/16 1056  BP: 108/68  Pulse: 60  Resp: 16  Temp: 98.4 F (36.9 C)   Filed Weights   03/16/16 1056  Weight: 250 lb (113.4 kg)   Body mass index is 38.01 kg/m.   Physical Exam    Constitutional: Appears well-developed and well-nourished. No distress.  HENT:  Head: Normocephalic and atraumatic.  Neck: Neck supple. No tracheal deviation present. No thyromegaly present.  Cardiovascular: Normal rate, regular rhythm and normal heart sounds.   No murmur heard. No carotid bruit  Pulmonary/Chest: Effort normal and breath sounds normal. No respiratory distress. No has no wheezes. No rales.  Abdomen: soft, non-tender,  Musculoskeletal: No edema.  Neurological:  Normal sensation all extremities, no weakness UE b/l, legs slightly leg and difficulty getting up from a chair, gait unstable even with cane Skin: Skin is warm and dry. Not diaphoretic.  Psychiatric: Normal mood and affect. Behavior is normal.     Assessment & Plan:    See Problem List for Assessment and Plan of chronic medical problems.   

## 2016-03-16 ENCOUNTER — Encounter: Payer: Self-pay | Admitting: Internal Medicine

## 2016-03-16 ENCOUNTER — Ambulatory Visit (INDEPENDENT_AMBULATORY_CARE_PROVIDER_SITE_OTHER): Payer: Medicare Other | Admitting: Internal Medicine

## 2016-03-16 VITALS — BP 108/68 | HR 60 | Temp 98.4°F | Resp 16 | Wt 250.0 lb

## 2016-03-16 DIAGNOSIS — G40909 Epilepsy, unspecified, not intractable, without status epilepticus: Secondary | ICD-10-CM

## 2016-03-16 DIAGNOSIS — R413 Other amnesia: Secondary | ICD-10-CM

## 2016-03-16 DIAGNOSIS — R410 Disorientation, unspecified: Secondary | ICD-10-CM

## 2016-03-16 DIAGNOSIS — N390 Urinary tract infection, site not specified: Secondary | ICD-10-CM | POA: Insufficient documentation

## 2016-03-16 DIAGNOSIS — E538 Deficiency of other specified B group vitamins: Secondary | ICD-10-CM | POA: Insufficient documentation

## 2016-03-16 DIAGNOSIS — Z23 Encounter for immunization: Secondary | ICD-10-CM

## 2016-03-16 NOTE — Assessment & Plan Note (Signed)
Basic blood work normal Most likely has a UTI, but I do not think this is causing any of his confusion Will await for culture results and start an antibiotic Confusion may be related to side effects from seizure medication, seizures or other neurological process Referred to neurology

## 2016-03-16 NOTE — Assessment & Plan Note (Signed)
B12 level on lower side Advised him to start taking vitamin B12 1000 g daily

## 2016-03-16 NOTE — Progress Notes (Signed)
Pre visit review using our clinic review tool, if applicable. No additional management support is needed unless otherwise documented below in the visit note. 

## 2016-03-16 NOTE — Assessment & Plan Note (Signed)
Urine shows infection He denies any symptoms and I do not think this is causing any of his confusion We'll wait for culture and start him on the appropriate antibiotic

## 2016-03-16 NOTE — Patient Instructions (Addendum)
Start taking 1000 mcg vitamin B12 daily.    We will call you later and most likely will be starting a medication for your UTI.  A referral was ordered for neurology.  I think most of your symptoms are neurological in nature - either side effects from your current seizure medications or seizures.    The rest of your blood work was normal.

## 2016-03-16 NOTE — Assessment & Plan Note (Signed)
Following at [facility] - currently on 3 medications He is experiencing multiple neurological symptoms-? Seizure, side effects from medication or other neurological process We'll refer to neurology at [facility] - they would like to transfer his care

## 2016-03-16 NOTE — Assessment & Plan Note (Signed)
He has some baseline memory difficulties since his surgery for his cerebral aneurysm in 3142, which was complicated by a stroke and VP shunt His memory has worsened Refer to neurology

## 2016-03-17 ENCOUNTER — Other Ambulatory Visit: Payer: Self-pay | Admitting: Internal Medicine

## 2016-03-17 ENCOUNTER — Telehealth: Payer: Self-pay | Admitting: Internal Medicine

## 2016-03-17 LAB — URINE CULTURE

## 2016-03-17 MED FILL — OFLOXACIN 0.3% EYE DROPS: 0.3 | 50 days supply | Qty: 5 | Fill #0

## 2016-03-17 MED FILL — DUREZOL 0.05% EYE DROPS: 0.05 | 50 days supply | Qty: 5 | Fill #0

## 2016-03-17 NOTE — Telephone Encounter (Signed)
Received a referral message from [facility] stating:   "Dr Delice Lesch has looked at the referral for the patient. She is not able to see patient until Nov. At this time. She said if it was urgent he would need to go back to the current neuro that he is seeing. Please advise what you would like [place] to do. Thank you so much for your help in this. Thank you  Hinton Dyer "  Please advise

## 2016-03-17 NOTE — Telephone Encounter (Signed)
Please schedule for November and I will have Lovena Le contact his wife and let her know.

## 2016-03-17 NOTE — Telephone Encounter (Signed)
I let Hinton Dyer know at [facility] and she will go ahead and call pt to schedule and let us know when its scheduled.  She will also put him on a wait list

## 2016-03-17 NOTE — Telephone Encounter (Signed)
His urine culture does show an infection. An antibiotic is pending - please send to preferred local pharmacy.   Let his wife know [facility] can not see him until November.  I think she should call a few different neurologists in the area and see if someone is able to see him sooner.  If so I can put a referral in for that neurologist

## 2016-03-18 MED ORDER — CIPROFLOXACIN HCL 500 MG PO TABS: 500.0000 mg | ORAL_TABLET | Freq: Two times a day (BID) | ORAL | 0 refills | Status: DC

## 2016-03-18 MED FILL — CIPROFLOXACIN HCL 500 MG TA: 500 | 7 days supply | Qty: 14 | Fill #0

## 2016-03-18 NOTE — Telephone Encounter (Signed)
I tried calling pts wife, LVM for her to call back.

## 2016-03-18 NOTE — Telephone Encounter (Signed)
LVM with pts wife, informing of MDs response.

## 2016-03-19 DIAGNOSIS — R410 Disorientation, unspecified: Secondary | ICD-10-CM | POA: Diagnosis not present

## 2016-03-19 DIAGNOSIS — R9401 Abnormal electroencephalogram [EEG]: Secondary | ICD-10-CM | POA: Diagnosis not present

## 2016-03-19 DIAGNOSIS — G40209 Localization-related (focal) (partial) symptomatic epilepsy and epileptic syndromes with complex partial seizures, not intractable, without status epilepticus: Secondary | ICD-10-CM | POA: Diagnosis not present

## 2016-03-19 DIAGNOSIS — G40909 Epilepsy, unspecified, not intractable, without status epilepticus: Secondary | ICD-10-CM | POA: Diagnosis not present

## 2016-03-20 DIAGNOSIS — G40209 Localization-related (focal) (partial) symptomatic epilepsy and epileptic syndromes with complex partial seizures, not intractable, without status epilepticus: Secondary | ICD-10-CM | POA: Diagnosis not present

## 2016-03-20 DIAGNOSIS — G40909 Epilepsy, unspecified, not intractable, without status epilepticus: Secondary | ICD-10-CM | POA: Diagnosis not present

## 2016-03-20 DIAGNOSIS — R9401 Abnormal electroencephalogram [EEG]: Secondary | ICD-10-CM | POA: Diagnosis not present

## 2016-03-20 DIAGNOSIS — R410 Disorientation, unspecified: Secondary | ICD-10-CM | POA: Diagnosis not present

## 2016-03-21 ENCOUNTER — Encounter: Payer: Self-pay | Admitting: Internal Medicine

## 2016-03-21 DIAGNOSIS — G40909 Epilepsy, unspecified, not intractable, without status epilepticus: Secondary | ICD-10-CM | POA: Diagnosis not present

## 2016-03-21 DIAGNOSIS — G40209 Localization-related (focal) (partial) symptomatic epilepsy and epileptic syndromes with complex partial seizures, not intractable, without status epilepticus: Secondary | ICD-10-CM | POA: Diagnosis not present

## 2016-03-21 DIAGNOSIS — R9401 Abnormal electroencephalogram [EEG]: Secondary | ICD-10-CM | POA: Diagnosis not present

## 2016-03-21 DIAGNOSIS — R410 Disorientation, unspecified: Secondary | ICD-10-CM | POA: Diagnosis not present

## 2016-03-22 NOTE — Telephone Encounter (Signed)
Spoke with pts wife, states that she would like to stick with a neurologist that specializes in epilepsy/ seizures.

## 2016-03-22 NOTE — Progress Notes (Signed)
Cardiology Office Note    Date:  03/25/2016   ID:  Jeff Weaver, DOB [birth year], MRN [id number]  PCP:  [name], MD  Cardiologist:  Dr. [name]  Chief Complaint:  Blood pressure fluctuation   History of Present Illness:   Jeff Weaver is a 70 y.o. male ith a history of obesity, HTN, HLD, chronic diastolic CHF, prediabetes, cerebral aneurysm s/p clipping at [facility] in 8/13 c/b short-term memory loss, cerebral hemorrhage and seizure disorder s/p VP shunt and sleep apnea who presented for follow up.   First seen by Dr. [name] 04/2013 for chest pain. F/u Myoview was normal.   He transferred from [place] to [facility] on 11/25/13 for chest pain and elevated troponin (0.74). He underwent LHC on 11/26/13 which revealed angiographically normal coronary arteries. Mild mid LAD myocardial bridging, moderate to severely elevated LVEDP - with systemic hypertension and preserved LVEF. ECHO  revealed EF 60-65%, mild LVH, no RWMAs. Mild AR, mod RV dilation, mild RA dilation. PA pressure 36 mmHG. He was discharged home. Never followed up since then.   He initially presented to [facility] 01/2016 for confusion and fever. Initially felt that he had pneumonia however work up reveled acte cholecystitis s/p removal of gall bladder. Echo during admission  showed LV EF of 60-65%, moderate concentric hypertrophy, grade 2 DD, mild AI. He never fully recovered.   Recently seen by PCP for ongoing confusion and fatigue. Possible UTI--> placed on abx.   Presents today for evaluation of fluctuating BP. He takes Losartan 34m and Procardia 31min AM. His BP usually runs in 110/70s AM and 140-460/70-80s in evening. Pulse of 50-60s. He feels dizzy occasionally, sitting to standing position. No syncope or orthopnea or PND. Intermittent chest tightness around left chest that radiates to mid sternal area. ? With dyspnea. [facility]   Past Medical History:  Diagnosis Date  . Cerebral  aneurysm   . CVA (cerebral infarction)   . Diverticulosis   . Enteritis (regional)    Dr BrOlevia Perches. GI bleed   . HTN (hypertension)   . Hyperlipidemia   . IBS (irritable bowel syndrome)   . [place] [place]  . Rheumatoid arthritis(714.0)    dxed in [employer] in 1980s  . Seizures ([facility]  . Sleep apnea     Past Surgical History:  Procedure Laterality Date  . CHOLECYSTECTOMY N/A 01/07/2016   Procedure: LAPAROSCOPIC CHOLECYSTECTOMY ;  Surgeon: DoCoralie KeensMD;  [facility] Service: General;  Laterality: N/A;  . cns shunt  02/23/12  . COLONOSCOPY W/ POLYPECTOMY  1978   negative 2009, Dr BrOlevia PerchesDue 2014  . CRANIOTOMY  02/02/12   Dr [place]vel [place] aneurysm  . 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 HERNIA REPAIR    . LEFT HEART CATHETERIZATION WITH CORONARY ANGIOGRAM N/A 11/26/2013   Procedure: LEFT HEART CATHETERIZATION WITH CORONARY ANGIOGRAM;  Surgeon: DaLeonie ManMD;  [facility];  Service: Cardiovascular;  Laterality: N/A;  . NOSE SURGERY    . SHOULDER SURGERY  1997  . TONSILLECTOMY AND ADENOIDECTOMY    . VENTRICULOPERITONEAL SHUNT  02-23-12   INSERTION OF RIGHT FRONTAL VENTRICULOPERITONEAL SHUNT WITH A CODMAN HAKIM PROGRAMMABLE VALVE    Current Medications: Prior to Admission medications   Medication Sig Start Date End Date Taking? Authorizing Provider  blood glucose  meter kit and supplies KIT Dispense based on patient and insurance preference. Use up to four times daily as directed. (prediabetes hypoglycemia). 10/26/15   [name], MD  cetirizine (ZYRTEC) 10 MG tablet Take 1 tablet (10 mg total) by mouth daily. 10/26/15   [name], MD  ciprofloxacin (CIPRO) 500 MG tablet Take 1 tablet (500 mg total) by mouth 2 (two) times daily. 03/18/16   [name], MD  Eslicarbazepine Acetate 400 MG TABS Take 600 mg by mouth 2 (two) times daily.  Take 1.5 in AM and PM    Historical Provider, MD  fluticasone (FLONASE) 50 MCG/ACT nasal spray Place 1 spray into both nostrils 2 (two) times daily. 05/13/14   Hendricks Limes, MD  Lacosamide (VIMPAT) 100 MG TABS Take 300 mg by mouth 2 (two) times daily. 1 tablets in the am 2 in the PM    Historical Provider, MD  levETIRAcetam (KEPPRA) 500 MG tablet Take 250 mg by mouth 2 (two) times daily.     Historical Provider, MD  losartan (COZAAR) 50 MG tablet Take 1 tablet (50 mg total) by mouth daily. 09/14/15   [name], MD  Melatonin 3 MG TABS Take 3 mg by mouth at bedtime.    Historical Provider, MD  mirabegron ER (MYRBETRIQ) 25 MG TB24 tablet Take 25 mg by mouth. 01/26/15   Historical Provider, MD  Multiple Vitamins-Minerals (MULTIVITAMIN) tablet Take 1 tablet by mouth daily. 10/26/15   [name], MD  NIFEdipine (PROCARDIA-XL/ADALAT-CC/NIFEDICAL-XL) 30 MG 24 hr tablet Take 1 tablet (30 mg total) by mouth daily. 09/14/15   [name], MD  NON FORMULARY CPAP Machine: authorized by [facility] in [employer], MD  pyridoxine (B-6) 100 MG tablet Take 100 mg by mouth daily.    Historical Provider, MD  ranitidine (ZANTAC) 150 MG capsule TAKE 1 CAPSULE EVERY MORNING 10/29/15   Jerene Bears, MD  Tamsulosin HCl (FLOMAX) 0.4 MG CAPS Take 0.8 mg by mouth at bedtime.     Historical Provider, MD  thiamine 100 MG tablet Take 100 mg by mouth daily.    Historical Provider, MD    Allergies:   Haloperidol; Lisinopril; Crestor [rosuvastatin]; Pravastatin; and Simvastatin   Social History   Social History  . Marital status: Married    Spouse name: N/A  . Number of children: 1  . Years of education: N/A   Occupational History  . retired    Social History Main Topics  . Smoking status: Former Smoker    Quit date: 07/04/2009  . Smokeless tobacco: Never Used  . Alcohol use 0.0 oz/week     Comment: Very Infrequently   . Drug use: No  . Sexual activity: Not Asked   Other Topics Concern  . None     Social History Narrative  . None     Family History:  The patient's family history includes COPD in his father; Coronary artery disease in his father; Dementia (age of onset: 2) in his sister; Diabetes in his brother and sister; Hepatitis in his mother.  ROS:   Please see the history of present illness.    ROS All other systems reviewed and are negative.   PHYSICAL EXAM:   VS:  BP 130/68   Pulse (!) 53   Ht 5' 8" (1.727 m)   Wt 248 lb (112.5 kg)   BMI 37.71 kg/m    GEN: Well nourished, well developed, in no acute distress  HEENT:  normal  Neck: no JVD, carotid bruits, or masses Cardiac:  RRR; no murmurs, rubs, or gallops, Trace BL LE edema Respiratory:  clear to auscultation bilaterally, normal work of breathing GI: soft, nontender, nondistended, + BS MS: no deformity or atrophy  Skin: warm and dry, no rash Neuro:  Alert and Oriented x 3, Strength and sensation are intact Psych: euthymic mood, full affect  Wt Readings from Last 3 Encounters:  03/25/16 248 lb (112.5 kg)  03/16/16 250 lb (113.4 kg)  01/21/16 245 lb (111.1 kg)      Studies/Labs Reviewed:   EKG:  EKG is ordered today.  The ekg ordered today demonstrates sinus bradycardia at rate of 53 bpm, prolonged PR interval. No acute changes  Recent Labs: 01/05/2016: B Natriuretic Peptide 102.5 01/06/2016: Magnesium 2.4 03/15/2016: ALT 33; BUN 17; Creatinine, Ser 0.92; Hemoglobin 14.5; Platelets 195.0; Potassium 4.4; Sodium 134; TSH 1.29   Lipid Panel    Component Value Date/Time   CHOL 203 (H) 07/07/2015 1141   CHOL 159 03/27/2013 0818   TRIG 111.0 07/07/2015 1141   TRIG 127 03/27/2013 0818   HDL 44.00 07/07/2015 1141   HDL 45 03/27/2013 0818   CHOLHDL 5 07/07/2015 1141   VLDL 22.2 07/07/2015 1141   LDLCALC 136 (H) 07/07/2015 1141   LDLCALC 89 03/27/2013 0818   LDLDIRECT 87.6 09/19/2012 1206    Additional studies/ records that were reviewed today include:   CARDIAC CATH 11/26/13:  POST-OPERATIVE  DIAGNOSIS:   Angiographically normal coronary arteries. Mild mid LAD myocardial bridging   Moderate to severely elevated LVEDP - with systemic Hypertension   Preserved LVEF PLAN OF CARE:   Return to Nursing unit for standard Radial Cath care   Can d/c IV Heparin.   Follow Echo to look for possible signs of pericarditis.   2D ECHO: 11/26/2013 LV EF: 60% - 65% Study Conclusions - Left ventricle: The cavity size was normal. Wall thickness was increased in a pattern of mild LVH. Systolic function was normal. The estimated ejection fraction was in the range of 60% to 65%. Wall motion was normal; there were no regional wall motion abnormalities. - Aortic valve: There was mild regurgitation. - Right ventricle: The cavity size was moderately dilated. - Right atrium: The atrium was mildly dilated. - Pulmonary arteries: Systolic pressure was mildly increased. PA peak pressure: 36 mm Hg (S).  ECHO 01/06/16 LV EF: 60% -   65%  ------------------------------------------------------------------- Indications:      Chest pain 786.51.  ------------------------------------------------------------------- History:   PMH:  Sepsis from CAP with metabolic encephalopathy. Confusion. AKI. Elevated troponin. Hypertension. Hyponatremia. Hypokalemia. Seizures. Bladder incontinence and BPH.  ------------------------------------------------------------------- Study Conclusions  - Left ventricle: The cavity size was normal. There was moderate   concentric hypertrophy. Systolic function was normal. The   estimated ejection fraction was in the range of 60% to 65%. Wall   motion was normal; there were no regional wall motion   abnormalities. Features are consistent with a pseudonormal left   ventricular filling pattern, with concomitant abnormal relaxation   and increased filling pressure (grade 2 diastolic dysfunction). - Aortic valve: There was mild regurgitation.  ASSESSMENT & PLAN:     1. Fluctuating blood pressure - AS describe above. The patient noted to be orthostatic and did felt dizzy in clinic. Has also has trace LE edema. Will discontinue his Procardia that might causing him to be othostatic and LE edema. Keep log of BP and call if BP too low or high. Try compression stocking.  If no improvement, trial of 24 hours bp monitor -/+ HCTZ. Hid dizziness my be due to multiple seizure medications.   2. Orthostatic Hypotension  - He also has intermittent dizziness. The patient is positive for orthostatic. Plan as above                         HR       BP Laying              52      120/74 Sitting               53      116/71 Standing 0 min 56      96/62   Dizzy Standing 3 min 56      95/63   Dizzy  3. Chest pain - Atypical and atypical. Had angiographically normal coronary arteries with mild mid LAD myocardial bridging 11/2013. Will get Myoview.   4. Chronic diastolic CHF -  Echo 08/2480   showed LV EF of 60-65%, moderate concentric hypertrophy, grade 2 DD, mild AI. Likely his LE edema is due to Procardia. As above.  5. Seizure disorder - [facility] at [facility]  6. HLD - followed by PCP  7. 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 Seems stable.    Medication Adjustments/Labs and Tests Ordered: Current medicines are reviewed at length with the patient today.  Concerns regarding medicines are outlined above.  Medication changes, Labs and Tests ordered today are listed in the Patient Instructions below. Patient Instructions  Medication Instructions:  Your physician has recommended you make the following change in your medication:  1.  STOP the Procardia   Labwork: None ordered  Testing/Procedures: Your physician has requested that you have a lexiscan myoview. For further information please visit [email or web address]. Please follow instruction sheet, as given.   Follow-Up: Your physician recommends that you schedule a follow-up  appointment in: 1-2 WEEKS WITH DR. [name] OR APP ON A DAY DR. [name] IS IN OFFICE   Any Other Special Instructions Will Be Listed Below (If Applicable).   Pharmacologic Stress Electrocardiogram A pharmacologic stress electrocardiogram is a heart (cardiac) test that uses nuclear imaging to evaluate the blood supply to your heart. This test may also be called a pharmacologic stress electrocardiography. Pharmacologic means that a medicine is used to increase your heart rate and blood pressure.  This stress test is done to find areas of poor blood flow to the heart by determining the extent of coronary artery disease (CAD). Some people exercise on a treadmill, which naturally increases the blood flow to the heart. For those people unable to exercise on a treadmill, a medicine is used. This medicine stimulates your heart and will cause your heart to beat harder and more quickly, as if you were exercising.  Pharmacologic stress tests can help determine:  The adequacy of blood flow to your heart during increased levels of activity in order to clear you for discharge home.  The extent of coronary artery blockage caused by CAD.  Your prognosis if you have suffered a heart attack.  The effectiveness of cardiac procedures done, such as an angioplasty, which can increase the circulation in your coronary arteries.  Causes of chest pain or pressure. LET [facility] CARE PROVIDER KNOW ABOUT:  Any allergies you have.  All medicines you are taking, including vitamins, herbs, eye drops, creams, and over-the-counter medicines.  Previous problems you or members  of your family have had with the use of anesthetics.  Any blood disorders you have.  Previous surgeries you have had.  Medical conditions you have.  Possibility of pregnancy, if this applies.  If you are currently breastfeeding. RISKS AND COMPLICATIONS Generally, this is a safe procedure. However, as with any procedure, complications can  occur. Possible complications include:  You develop pain or pressure in the following areas:  Chest.  Jaw or neck.  Between your shoulder blades.  Radiating down your left arm.  Headache.  Dizziness or light-headedness.  Shortness of breath.  Increased or irregular heartbeat.  Low blood pressure.  Nausea or vomiting.  Flushing.  Redness going up the arm and slight pain during injection of medicine.  Heart attack (rare). BEFORE THE PROCEDURE   Avoid all forms of caffeine for 24 hours before your test or as directed by your health care provider. This includes coffee, tea (even decaffeinated tea), caffeinated sodas, chocolate, cocoa, and certain pain medicines.  Follow your health care provider's instructions regarding eating and drinking before the test.  Take your medicines as directed at regular times with water unless instructed otherwise. Exceptions may include:  If you have diabetes, ask how you are to take your insulin or pills. It is common to adjust insulin dosing the morning of the test.  If you are taking beta-blocker medicines, it is important to talk to your health care provider about these medicines well before the date of your test. Taking beta-blocker medicines may interfere with the test. In some cases, these medicines need to be changed or stopped 24 hours or more before the test.  If you wear a nitroglycerin patch, it may need to be removed prior to the test. Ask your health care provider if the patch should be removed before the test.  If you use an inhaler for any breathing condition, bring it with you to the test.  If you are an outpatient, bring a snack so you can eat right after the stress phase of the test.  Do not smoke for 4 hours prior to the test or as directed by your health care provider.  Do not apply lotions, powders, creams, or oils on your chest prior to the test.  Wear comfortable shoes and clothing. Let your health care provider  know if you were unable to complete or follow the preparations for your test. PROCEDURE   Multiple patches (electrodes) will be put on your chest. If needed, small areas of your chest may be shaved to get better contact with the electrodes. Once the electrodes are attached to your body, multiple wires will be attached to the electrodes, and your heart rate will be monitored.  An IV access will be started. A nuclear trace (isotope) is given. The isotope may be given intravenously, or it may be swallowed. Nuclear refers to several types of radioactive isotopes, and the nuclear isotope lights up the arteries so that the nuclear images are clear. The isotope is absorbed by your body. This results in low radiation exposure.  A resting nuclear image is taken to show how your heart functions at rest.  A medicine is given through the IV access.  A second scan is done about 1 hour after the medicine injection and determines how your heart functions under stress.  During this stress phase, you will be connected to an electrocardiogram machine. Your blood pressure and oxygen levels will be monitored. AFTER THE PROCEDURE   Your heart rate and blood pressure  will be monitored after the test.  You may return to your normal schedule, including diet,activities, and medicines, unless your health care provider tells you otherwise.   This information is not intended to replace advice given to you by your health care provider. Make sure you discuss any questions you have with your health care provider.   Document Released: 11/06/2008 Document Revised: 06/25/2013 Document Reviewed: 02/25/2013 Elsevier Interactive Patient Education [employer].     If you need a refill on your cardiac medications before your next appointment, please call your pharmacy.      Jarrett Soho, [place]  03/25/2016 8:51 AM    [facility] [address], [place], [place]  [address] Phone:  [phone]; Fax: [phone]

## 2016-03-24 ENCOUNTER — Encounter: Payer: Self-pay | Admitting: Physician Assistant

## 2016-03-25 ENCOUNTER — Encounter: Payer: Self-pay | Admitting: Physician Assistant

## 2016-03-25 ENCOUNTER — Ambulatory Visit (INDEPENDENT_AMBULATORY_CARE_PROVIDER_SITE_OTHER): Payer: Medicare Other | Admitting: Physician Assistant

## 2016-03-25 VITALS — BP 130/68 | HR 53 | Ht 68.0 in | Wt 248.0 lb

## 2016-03-25 DIAGNOSIS — I10 Essential (primary) hypertension: Secondary | ICD-10-CM | POA: Diagnosis not present

## 2016-03-25 DIAGNOSIS — R001 Bradycardia, unspecified: Secondary | ICD-10-CM

## 2016-03-25 DIAGNOSIS — R600 Localized edema: Secondary | ICD-10-CM

## 2016-03-25 DIAGNOSIS — R079 Chest pain, unspecified: Secondary | ICD-10-CM | POA: Diagnosis not present

## 2016-03-25 DIAGNOSIS — I5032 Chronic diastolic (congestive) heart failure: Secondary | ICD-10-CM | POA: Diagnosis not present

## 2016-03-25 DIAGNOSIS — I440 Atrioventricular block, first degree: Secondary | ICD-10-CM

## 2016-03-25 DIAGNOSIS — I951 Orthostatic hypotension: Secondary | ICD-10-CM | POA: Diagnosis not present

## 2016-03-25 NOTE — Patient Instructions (Addendum)
Medication Instructions:  Your physician has recommended you make the following change in your medication:  1.  STOP the Procardia   Labwork: None ordered  Testing/Procedures: Your physician has requested that you have a lexiscan myoview. For further information please visit [email or web address]. Please follow instruction sheet, as given.   Follow-Up: Your physician recommends that you schedule a follow-up appointment in: 1-2 WEEKS WITH DR. [name] OR APP ON A DAY DR. [name] IS IN OFFICE   Any Other Special Instructions Will Be Listed Below (If Applicable).   Pharmacologic Stress Electrocardiogram A pharmacologic stress electrocardiogram is a heart (cardiac) test that uses nuclear imaging to evaluate the blood supply to your heart. This test may also be called a pharmacologic stress electrocardiography. Pharmacologic means that a medicine is used to increase your heart rate and blood pressure.  This stress test is done to find areas of poor blood flow to the heart by determining the extent of coronary artery disease (CAD). Some people exercise on a treadmill, which naturally increases the blood flow to the heart. For those people unable to exercise on a treadmill, a medicine is used. This medicine stimulates your heart and will cause your heart to beat harder and more quickly, as if you were exercising.  Pharmacologic stress tests can help determine:  The adequacy of blood flow to your heart during increased levels of activity in order to clear you for discharge home.  The extent of coronary artery blockage caused by CAD.  Your prognosis if you have suffered a heart attack.  The effectiveness of cardiac procedures done, such as an angioplasty, which can increase the circulation in your coronary arteries.  Causes of chest pain or pressure. LET [facility] CARE PROVIDER KNOW ABOUT:  Any allergies you have.  All medicines you are taking, including vitamins, herbs, eye drops, creams,  and over-the-counter medicines.  Previous problems you or members of your family have had with the use of anesthetics.  Any blood disorders you have.  Previous surgeries you have had.  Medical conditions you have.  Possibility of pregnancy, if this applies.  If you are currently breastfeeding. RISKS AND COMPLICATIONS Generally, this is a safe procedure. However, as with any procedure, complications can occur. Possible complications include:  You develop pain or pressure in the following areas:  Chest.  Jaw or neck.  Between your shoulder blades.  Radiating down your left arm.  Headache.  Dizziness or light-headedness.  Shortness of breath.  Increased or irregular heartbeat.  Low blood pressure.  Nausea or vomiting.  Flushing.  Redness going up the arm and slight pain during injection of medicine.  Heart attack (rare). BEFORE THE PROCEDURE   Avoid all forms of caffeine for 24 hours before your test or as directed by your health care provider. This includes coffee, tea (even decaffeinated tea), caffeinated sodas, chocolate, cocoa, and certain pain medicines.  Follow your health care provider's instructions regarding eating and drinking before the test.  Take your medicines as directed at regular times with water unless instructed otherwise. Exceptions may include:  If you have diabetes, ask how you are to take your insulin or pills. It is common to adjust insulin dosing the morning of the test.  If you are taking beta-blocker medicines, it is important to talk to your health care provider about these medicines well before the date of your test. Taking beta-blocker medicines may interfere with the test. In some cases, these medicines need to be changed or stopped 24 hours or  more before the test.  If you wear a nitroglycerin patch, it may need to be removed prior to the test. Ask your health care provider if the patch should be removed before the test.  If you  use an inhaler for any breathing condition, bring it with you to the test.  If you are an outpatient, bring a snack so you can eat right after the stress phase of the test.  Do not smoke for 4 hours prior to the test or as directed by your health care provider.  Do not apply lotions, powders, creams, or oils on your chest prior to the test.  Wear comfortable shoes and clothing. Let your health care provider know if you were unable to complete or follow the preparations for your test. PROCEDURE   Multiple patches (electrodes) will be put on your chest. If needed, small areas of your chest may be shaved to get better contact with the electrodes. Once the electrodes are attached to your body, multiple wires will be attached to the electrodes, and your heart rate will be monitored.  An IV access will be started. A nuclear trace (isotope) is given. The isotope may be given intravenously, or it may be swallowed. Nuclear refers to several types of radioactive isotopes, and the nuclear isotope lights up the arteries so that the nuclear images are clear. The isotope is absorbed by your body. This results in low radiation exposure.  A resting nuclear image is taken to show how your heart functions at rest.  A medicine is given through the IV access.  A second scan is done about 1 hour after the medicine injection and determines how your heart functions under stress.  During this stress phase, you will be connected to an electrocardiogram machine. Your blood pressure and oxygen levels will be monitored. AFTER THE PROCEDURE   Your heart rate and blood pressure will be monitored after the test.  You may return to your normal schedule, including diet,activities, and medicines, unless your health care provider tells you otherwise.   This information is not intended to replace advice given to you by your health care provider. Make sure you discuss any questions you have with your health care  provider.   Document Released: 11/06/2008 Document Revised: 06/25/2013 Document Reviewed: 02/25/2013 Elsevier Interactive Patient Education [employer].     If you need a refill on your cardiac medications before your next appointment, please call your pharmacy.

## 2016-03-30 ENCOUNTER — Telehealth (HOSPITAL_COMMUNITY): Payer: Self-pay | Admitting: *Deleted

## 2016-03-30 DIAGNOSIS — H25011 Cortical age-related cataract, right eye: Secondary | ICD-10-CM | POA: Diagnosis not present

## 2016-03-30 DIAGNOSIS — H2512 Age-related nuclear cataract, left eye: Secondary | ICD-10-CM | POA: Diagnosis not present

## 2016-03-30 DIAGNOSIS — H2511 Age-related nuclear cataract, right eye: Secondary | ICD-10-CM | POA: Diagnosis not present

## 2016-03-30 DIAGNOSIS — H25012 Cortical age-related cataract, left eye: Secondary | ICD-10-CM | POA: Diagnosis not present

## 2016-03-30 NOTE — Telephone Encounter (Signed)
Left message on voicemail per DPR in reference to upcoming appointment scheduled on 04/01/16 with detailed instructions given per Myocardial Perfusion Study Information Sheet for the test. LM to arrive 15 minutes early, and that it is imperative to arrive on time for appointment to keep from having the test rescheduled. If you need to cancel or reschedule your appointment, please call the office within 24 hours of your appointment. Failure to do so may result in a cancellation of your appointment, and a $50 no show fee. Phone number given for call back for any questions.   Kirstie Peri

## 2016-04-01 ENCOUNTER — Ambulatory Visit (HOSPITAL_COMMUNITY): Payer: Medicare Other | Attending: Cardiology

## 2016-04-01 DIAGNOSIS — I10 Essential (primary) hypertension: Secondary | ICD-10-CM | POA: Insufficient documentation

## 2016-04-01 DIAGNOSIS — R079 Chest pain, unspecified: Secondary | ICD-10-CM | POA: Diagnosis not present

## 2016-04-01 LAB — MYOCARDIAL PERFUSION IMAGING
LV dias vol: 152 mL (ref 62–150)
LV sys vol: 47 mL
Peak HR: 82 {beats}/min
RATE: 0.36
Rest HR: 55 {beats}/min
SDS: 3
SRS: 4
SSS: 7
TID: 0.8

## 2016-04-01 MED ORDER — TECHNETIUM TC 99M TETROFOSMIN IV KIT
10.2000 | PACK | Freq: Once | INTRAVENOUS | Status: AC | PRN
  Administered 2016-04-01: 10 via INTRAVENOUS
  Filled 2016-04-01: qty 10

## 2016-04-01 MED ORDER — TECHNETIUM TC 99M TETROFOSMIN IV KIT
33.0000 | PACK | Freq: Once | INTRAVENOUS | Status: AC | PRN
  Administered 2016-04-01: 33 via INTRAVENOUS
  Filled 2016-04-01: qty 33

## 2016-04-01 MED ORDER — REGADENOSON 0.4 MG/5ML IV SOLN
0.4000 mg | Freq: Once | INTRAVENOUS | Status: AC
  Administered 2016-04-01: 0.4 mg via INTRAVENOUS

## 2016-04-05 DIAGNOSIS — Z79899 Other long term (current) drug therapy: Secondary | ICD-10-CM | POA: Diagnosis not present

## 2016-04-05 DIAGNOSIS — F4310 Post-traumatic stress disorder, unspecified: Secondary | ICD-10-CM | POA: Diagnosis not present

## 2016-04-05 DIAGNOSIS — R2681 Unsteadiness on feet: Secondary | ICD-10-CM | POA: Diagnosis not present

## 2016-04-05 DIAGNOSIS — R569 Unspecified convulsions: Secondary | ICD-10-CM | POA: Diagnosis not present

## 2016-04-05 DIAGNOSIS — E785 Hyperlipidemia, unspecified: Secondary | ICD-10-CM | POA: Diagnosis not present

## 2016-04-05 DIAGNOSIS — G4733 Obstructive sleep apnea (adult) (pediatric): Secondary | ICD-10-CM | POA: Diagnosis not present

## 2016-04-05 DIAGNOSIS — I10 Essential (primary) hypertension: Secondary | ICD-10-CM | POA: Diagnosis not present

## 2016-04-05 DIAGNOSIS — G3184 Mild cognitive impairment, so stated: Secondary | ICD-10-CM | POA: Diagnosis not present

## 2016-04-05 DIAGNOSIS — R5381 Other malaise: Secondary | ICD-10-CM | POA: Diagnosis not present

## 2016-04-05 DIAGNOSIS — R42 Dizziness and giddiness: Secondary | ICD-10-CM | POA: Diagnosis not present

## 2016-04-05 DIAGNOSIS — R269 Unspecified abnormalities of gait and mobility: Secondary | ICD-10-CM | POA: Diagnosis not present

## 2016-04-05 DIAGNOSIS — Z8673 Personal history of transient ischemic attack (TIA), and cerebral infarction without residual deficits: Secondary | ICD-10-CM | POA: Diagnosis not present

## 2016-04-06 DIAGNOSIS — H2512 Age-related nuclear cataract, left eye: Secondary | ICD-10-CM | POA: Diagnosis not present

## 2016-04-06 DIAGNOSIS — H25012 Cortical age-related cataract, left eye: Secondary | ICD-10-CM | POA: Diagnosis not present

## 2016-04-07 ENCOUNTER — Encounter: Payer: Self-pay | Admitting: Physician Assistant

## 2016-04-07 DIAGNOSIS — R0989 Other specified symptoms and signs involving the circulatory and respiratory systems: Secondary | ICD-10-CM | POA: Insufficient documentation

## 2016-04-07 NOTE — Progress Notes (Deleted)
Cardiology Office Note    Date:  04/07/2016  ID:  Jeff Weaver, DOB Aug 07, 1945, MRN [id number] PCP:  [name], MD  Cardiologist:  Dr. [name]   Chief Complaint: f/u blood pressure  History of Present Illness:  Jeff Weaver is a 70 y.o. male with history of obesity, HTN, HLD, chronic diastolic CHF/hypertensive heart disease, prediabetes, cerebral aneurysm s/p clipping at [facility] in 8/13 c/b short-term memory loss, cerebral hemorrhage and seizure disorder s/p VP shunt, sleep apnea, mild AI, sinus bradycardia (baseline HR 50s, 1st degree AV block) who presents for f/u BP.  First seen by Dr. [name] 04/2013 for chest pain with normal nuclear stress test. Admitted 11/2013 for chest pain and elevated troponin (0.74) -> LHC 11/26/13 revealed angiographically normal coronary arteries, mild mid LAD myocardial bridging, moderate to severely elevated LVEDP - with systemic hypertension and preserved LVEF. Echo with EF 60-65%, LVH, pt did not follow up after that. More recently admitted 01/2016 for confusion/fever with possible PNA then diagnosis of acute cholecystitis s/p removal of gallbladder. He's since been followed by PCP for ongoing confusion and fatigue, with treatment of UTI. 2D echo in 01/2016 showed mod LVH, EF 60-65%, grade 2 DD, no RWMA, mild AI. Labs 03/2016 with Na 134, K 4.4, glucose 102, normal TSH, A1C and CBC.  He was seen by Robbie Lis PA-C 03/25/16 reporting fluctuating BP, dizziness upon standing, intermittent chest discomfort and LEE. He had reported BP 110/70s AM and [id number] systolic in PM. He was positive for orthostasis in the office wiht BP dropping from 120/74 layng to 95/63 standing with associated dizziness. Procardia was felt to be contributing to edema so this was stopped. Compression hose were recommended. Nuclear stress test was normal, EF >65%.    osa rx Monitor for dz?    Past Medical History:  Diagnosis Date  . Aortic insufficiency   . Cerebral aneurysm    a. s/p  clipping at [facility] in 8/13 c/b short-term memory loss, cerebral hemorrhage and seizure disorder s/p VP shunt.  . Cerebral hemorrhage ([place])   . Chronic diastolic CHF (congestive heart failure) ([place])   . CVA (cerebral infarction)   . Diverticulosis   . Enteritis (regional)    Dr Olevia Perches  . First degree AV block   . GI bleed   . HTN (hypertension)   . Hyperlipidemia   . Hypertensive heart disease   . IBS (irritable bowel syndrome)   . Orthostatic hypotension   . Pre-diabetes   . 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 Rheumatoid arthritis(714.0)    dxed in [employer] in 1980s  . Seizures ([place])   . Sinus bradycardia   . Sleep apnea     Past Surgical History:  Procedure Laterality Date  . CHOLECYSTECTOMY N/A 01/07/2016   Procedure: LAPAROSCOPIC CHOLECYSTECTOMY ;  Surgeon: Coralie Keens, MD;  Location: [place];  Service: General;  Laterality: N/A;  . cns shunt  02/23/12  . COLONOSCOPY W/ POLYPECTOMY  1978   negative 2009, Dr Olevia Perches. Due 2014  . CRANIOTOMY  02/02/12   Dr Harvel Ricks, [place] of aneurysm  . 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 HERNIA REPAIR    . LEFT HEART CATHETERIZATION WITH CORONARY ANGIOGRAM N/A 11/26/2013   Procedure: LEFT HEART CATHETERIZATION WITH CORONARY ANGIOGRAM;  Surgeon: [name], MD;  Location: [facility] CATH LAB;  Service: Cardiovascular;  Laterality: N/A;  . NOSE SURGERY    .  SHOULDER SURGERY  1997  . TONSILLECTOMY AND ADENOIDECTOMY    . VENTRICULOPERITONEAL SHUNT  02-23-12   INSERTION OF RIGHT FRONTAL VENTRICULOPERITONEAL SHUNT WITH A CODMAN HAKIM PROGRAMMABLE VALVE    Current Medications: Current Outpatient Prescriptions  Medication Sig Dispense Refill  . blood glucose meter kit and supplies KIT Dispense based on patient and insurance preference. Use up to four times daily as directed. (prediabetes hypoglycemia). 1 each 0  . cetirizine (ZYRTEC) 10 MG tablet Take  1 tablet (10 mg total) by mouth daily. 30 tablet 11  . ciprofloxacin (CIPRO) 500 MG tablet Take 1 tablet (500 mg total) by mouth 2 (two) times daily. 14 tablet 0  . Eslicarbazepine Acetate 400 MG TABS Take 600 mg by mouth 2 (two) times daily. Take 1.5 in AM and PM    . Lacosamide (VIMPAT) 100 MG TABS Take 100 mg by mouth daily. 1 tablets in the am 2 in the PM    . levETIRAcetam (KEPPRA) 500 MG tablet Take 250 mg by mouth 2 (two) times daily.     Marland Kitchen losartan (COZAAR) 50 MG tablet Take 1 tablet (50 mg total) by mouth daily. 90 tablet 3  . Melatonin 3 MG TABS Take 3 mg by mouth at bedtime.    . mirabegron ER (MYRBETRIQ) 25 MG TB24 tablet Take 25 mg by mouth.    . Multiple Vitamins-Minerals (MULTIVITAMIN) tablet Take 1 tablet by mouth daily.    . NON FORMULARY CPAP Machine: authorized by [facility] in [place]    . pyridoxine (B-6) 100 MG tablet Take 100 mg by mouth daily.    . ranitidine (ZANTAC) 150 MG capsule TAKE 1 CAPSULE EVERY MORNING 90 capsule 1  . Tamsulosin HCl (FLOMAX) 0.4 MG CAPS Take 0.8 mg by mouth at bedtime.     . thiamine 100 MG tablet Take 100 mg by mouth daily.     No current facility-administered medications for this visit.      Allergies:   Haloperidol; Lisinopril; Crestor [rosuvastatin]; Pravastatin; and Simvastatin   Social History   Social History  . Marital status: Married    Spouse name: N/A  . Number of children: 1  . Years of education: N/A   Occupational History  . retired    Social History Main Topics  . Smoking status: Former Smoker    Quit date: 07/04/2009  . Smokeless tobacco: Never Used  . Alcohol use 0.0 oz/week     Comment: Very Infrequently   . Drug use: No  . Sexual activity: Not on file   Other Topics Concern  . Not on file   Social History Narrative  . No narrative on file     Family History:  The patient's family history includes COPD in his father; Coronary artery disease in his father; Dementia (age of onset: 90) in his sister; Diabetes in his  brother and sister; Hepatitis in his mother. ***  ROS:   Please see the history of present illness. Otherwise, review of systems is positive for ***.  All other systems are reviewed and otherwise negative.    PHYSICAL EXAM:   VS:  There were no vitals taken for this visit.  BMI: There is no height or weight on file to calculate BMI. GEN: Well nourished, well developed, in no acute distress  HEENT: normocephalic, atraumatic Neck: no JVD, carotid bruits, or masses Cardiac: ***RRR; no murmurs, rubs, or gallops, no edema  Respiratory:  clear to auscultation bilaterally, normal work of breathing GI: soft, nontender, nondistended, +  BS MS: no deformity or atrophy  Skin: warm and dry, no rash Neuro:  Alert and Oriented x 3, Strength and sensation are intact, follows commands Psych: euthymic mood, full affect  Wt Readings from Last 3 Encounters:  03/25/16 248 lb (112.5 kg)  03/16/16 250 lb (113.4 kg)  01/21/16 245 lb (111.1 kg)      Studies/Labs Reviewed:   EKG:  EKG was ordered today and personally reviewed by me and demonstrates *** EKG was not ordered today.***  Recent Labs: 01/05/2016: B Natriuretic Peptide 102.5 01/06/2016: Magnesium 2.4 03/15/2016: ALT 33; BUN 17; Creatinine, Ser 0.92; Hemoglobin 14.5; Platelets 195.0; Potassium 4.4; Sodium 134; TSH 1.29   Lipid Panel    Component Value Date/Time   CHOL 203 (H) 07/07/2015 1141   CHOL 159 03/27/2013 0818   TRIG 111.0 07/07/2015 1141   TRIG 127 03/27/2013 0818   HDL 44.00 07/07/2015 1141   HDL 45 03/27/2013 0818   CHOLHDL 5 07/07/2015 1141   VLDL 22.2 07/07/2015 1141   LDLCALC 136 (H) 07/07/2015 1141   LDLCALC 89 03/27/2013 0818   LDLDIRECT 87.6 09/19/2012 1206    Additional studies/ records that were reviewed today include: Summarized above.***    ASSESSMENT & PLAN:   1. Orthostatic hypotension/labile hypertension 2. Chest pain - recent nuc normal. 3. Sinus bradycardia 4. Chronic diastolic CHF  Disposition:  F/u with ***   Medication Adjustments/Labs and Tests Ordered: Current medicines are reviewed at length with the patient today.  Concerns regarding medicines are outlined above. Medication changes, Labs and Tests ordered today are summarized above and listed in the Patient Instructions accessible in Encounters.   Raechel Ache PA-C  04/07/2016 1:14 PM    [facility] [place], [place], [place]  [address] Phone: [phone]; Fax: [phone]

## 2016-04-08 ENCOUNTER — Encounter: Payer: Self-pay | Admitting: *Deleted

## 2016-04-08 ENCOUNTER — Ambulatory Visit: Payer: Medicare Other | Admitting: Cardiology

## 2016-04-08 ENCOUNTER — Ambulatory Visit: Payer: Medicare Other | Admitting: Physician Assistant

## 2016-04-08 DIAGNOSIS — R4189 Other symptoms and signs involving cognitive functions and awareness: Secondary | ICD-10-CM | POA: Diagnosis not present

## 2016-04-08 DIAGNOSIS — R279 Unspecified lack of coordination: Secondary | ICD-10-CM | POA: Diagnosis not present

## 2016-04-08 DIAGNOSIS — Z789 Other specified health status: Secondary | ICD-10-CM | POA: Diagnosis not present

## 2016-04-08 DIAGNOSIS — R2681 Unsteadiness on feet: Secondary | ICD-10-CM | POA: Diagnosis not present

## 2016-04-08 DIAGNOSIS — R5381 Other malaise: Secondary | ICD-10-CM | POA: Diagnosis not present

## 2016-04-08 DIAGNOSIS — R269 Unspecified abnormalities of gait and mobility: Secondary | ICD-10-CM | POA: Diagnosis not present

## 2016-04-11 ENCOUNTER — Ambulatory Visit (INDEPENDENT_AMBULATORY_CARE_PROVIDER_SITE_OTHER): Payer: Medicare Other | Admitting: Physician Assistant

## 2016-04-11 ENCOUNTER — Encounter: Payer: Self-pay | Admitting: Physician Assistant

## 2016-04-11 VITALS — BP 126/70 | HR 58 | Ht 68.0 in | Wt 249.0 lb

## 2016-04-11 DIAGNOSIS — I10 Essential (primary) hypertension: Secondary | ICD-10-CM | POA: Diagnosis not present

## 2016-04-11 DIAGNOSIS — G4733 Obstructive sleep apnea (adult) (pediatric): Secondary | ICD-10-CM

## 2016-04-11 DIAGNOSIS — I5032 Chronic diastolic (congestive) heart failure: Secondary | ICD-10-CM

## 2016-04-11 DIAGNOSIS — I951 Orthostatic hypotension: Secondary | ICD-10-CM

## 2016-04-11 DIAGNOSIS — R001 Bradycardia, unspecified: Secondary | ICD-10-CM | POA: Diagnosis not present

## 2016-04-11 DIAGNOSIS — R079 Chest pain, unspecified: Secondary | ICD-10-CM

## 2016-04-11 DIAGNOSIS — R0989 Other specified symptoms and signs involving the circulatory and respiratory systems: Secondary | ICD-10-CM

## 2016-04-11 NOTE — Progress Notes (Signed)
Cardiology Office Note    Date:  04/11/2016  ID:  Jeff Weaver, DOB 03-Oct-1945, MRN [id number] PCP:  [name], MD  Cardiologist:  [name]   Chief Complaint: f/u blood pressure  History of Present Illness:  Jeff Weaver is a 70 y.o. male with history of obesity, HTN, HLD, chronic diastolic CHF/hypertensive heart disease, prediabetes, cerebral aneurysm s/p clipping at [facility] in 8/13 c/b short-term memory loss, cerebral hemorrhage and seizure disorder s/p VP shunt, sleep apnea, mild AI, sinus bradycardia (baseline HR 50s, 1st degree AV block) who presents for f/u BP.  First seen by Dr. [name] 04/2013 for chest pain with normal nuclear stress test. Admitted 11/2013 for chest pain and elevated troponin (0.74) -> LHC 11/26/13 revealed angiographically normal coronary arteries, mild mid LAD myocardial bridging, moderate to severely elevated LVEDP - with systemic hypertension and preserved LVEF. Echo with EF 60-65%, LVH, pt did not follow up after that. More recently admitted 01/2016 for confusion/fever with possible PNA then diagnosis of acute cholecystitis s/p removal of gallbladder. He's since been followed by PCP for ongoing confusion and fatigue, with treatment of UTI. 2D echo in 01/2016 showed mod LVH, EF 60-65%, grade 2 DD, no RWMA, mild AI. Labs 03/2016 with Na 134, K 4.4, glucose 102, normal TSH, A1C and CBC.  He was seen by Robbie Lis PA-C 03/25/16 reporting fluctuating BP, dizziness upon standing, intermittent chest discomfort and LEE. His neurologist had requested cardiology manage his BP. He had reported BP 110/70s AM and [id number] systolic in PM. He was positive for orthostasis in the office with BP dropping from 120/74 layng to 95/63 standing with associated dizziness. Procardia was felt to be contributing to edema so this was stopped. Compression hose were recommended. Nuclear stress test was normal, EF >65%.  He returns for follow-up with his wife and daughter. He is feeling better.  Dizziness has improved after cessation of nifedipine. He gets mild dizziness upon standing but it now lasts only a few seconds and does not happen every single time. He exercises orthostatic precautions. No further complaints of chest pain. No falls or syncope. BP well controlled today. He reports inconsistent use with CPAP - not sure it fits right, sometimes wakes up with it off having apparently removed it in the middle of the night.   Past Medical History:  Diagnosis Date  . Aortic insufficiency   . Cerebral aneurysm    a. s/p clipping at [facility] in 8/13 c/b short-term memory loss, cerebral hemorrhage and seizure disorder s/p VP shunt.  . Cerebral hemorrhage ([place])   . Chronic diastolic CHF (congestive heart failure) ([place])   . CVA (cerebral infarction)   . Diverticulosis   . Enteritis (regional)    Dr Olevia Perches  . First degree AV block   . GI bleed   . HTN (hypertension)   . Hyperlipidemia   . Hypertensive heart disease   . IBS (irritable bowel syndrome)   . Orthostatic hypotension   . Pre-diabetes   . 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 Rheumatoid arthritis(714.0)    dxed in [employer] in 1980s  . Seizures ([place])   . Sinus bradycardia   . Sleep apnea     Past Surgical History:  Procedure Laterality Date  . CHOLECYSTECTOMY N/A 01/07/2016   Procedure: LAPAROSCOPIC CHOLECYSTECTOMY ;  Surgeon: Coralie Keens, MD;  Location: [place];  Service: General;  Laterality: N/A;  . cns shunt  02/23/12  . COLONOSCOPY W/ POLYPECTOMY  1978  negative 2009, Dr Olevia Perches. Due 2014  . CRANIOTOMY  02/02/12   Dr Harvel Ricks, [place] of aneurysm  . 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 HERNIA REPAIR    . LEFT HEART CATHETERIZATION WITH CORONARY ANGIOGRAM N/A 11/26/2013   Procedure: LEFT HEART CATHETERIZATION WITH CORONARY ANGIOGRAM;  Surgeon: [name], MD;  Location: [place] CATH LAB;  Service: Cardiovascular;   Laterality: N/A;  . NOSE SURGERY    . SHOULDER SURGERY  1997  . TONSILLECTOMY AND ADENOIDECTOMY    . VENTRICULOPERITONEAL SHUNT  02-23-12   INSERTION OF RIGHT FRONTAL VENTRICULOPERITONEAL SHUNT WITH A CODMAN HAKIM PROGRAMMABLE VALVE    Current Medications: Current Outpatient Prescriptions  Medication Sig Dispense Refill  . blood glucose meter kit and supplies KIT Dispense based on patient and insurance preference. Use up to four times daily as directed. (prediabetes hypoglycemia). 1 each 0  . cetirizine (ZYRTEC) 10 MG tablet Take 1 tablet (10 mg total) by mouth daily. 30 tablet 11  . Eslicarbazepine Acetate 400 MG TABS Take 600 mg by mouth 2 (two) times daily. Take 1.5 in AM and PM    . levETIRAcetam (KEPPRA) 500 MG tablet Take 250 mg by mouth 2 (two) times daily.     Marland Kitchen losartan (COZAAR) 50 MG tablet Take 1 tablet (50 mg total) by mouth daily. 90 tablet 3  . Melatonin 3 MG TABS Take 3 mg by mouth at bedtime.    . mirabegron ER (MYRBETRIQ) 25 MG TB24 tablet Take 25 mg by mouth.    . Multiple Vitamins-Minerals (MULTIVITAMIN) tablet Take 1 tablet by mouth daily.    . NON FORMULARY CPAP Machine: authorized by [facility] in [place]    . pyridoxine (B-6) 100 MG tablet Take 100 mg by mouth daily.    . Tamsulosin HCl (FLOMAX) 0.4 MG CAPS Take 0.8 mg by mouth at bedtime.     . thiamine 100 MG tablet Take 100 mg by mouth daily.     No current facility-administered medications for this visit.      Allergies:   Haloperidol; Lisinopril; Crestor [rosuvastatin]; Pravastatin; and Simvastatin   Social History   Social History  . Marital status: Married    Spouse name: N/A  . Number of children: 1  . Years of education: N/A   Occupational History  . retired    Social History Main Topics  . Smoking status: Former Smoker    Quit date: 07/04/2009  . Smokeless tobacco: Never Used  . Alcohol use 0.0 oz/week     Comment: Very Infrequently   . Drug use: No  . Sexual activity: Not Asked   Other Topics  Concern  . None   Social History Narrative  . None     Family History:  The patient's family history includes COPD in his father; Coronary artery disease in his father; Dementia (age of onset: 48) in his sister; Diabetes in his brother and sister; Hepatitis in his mother.   ROS:  + arthritis in multiple joints. Please see the history of present illness. All other systems are reviewed and otherwise negative.    PHYSICAL EXAM:   VS:  BP 126/70   Pulse (!) 58   Ht 5' 8"  (1.727 m)   Wt 249 lb (112.9 kg)   SpO2 95%   BMI 37.86 kg/m   BMI: Body mass index is 37.86 kg/m. GEN: Well nourished, well developed WM, obese, in no acute distress  HEENT:  normocephalic, atraumatic Neck: no JVD, carotid bruits, or masses Cardiac: RRR; no murmurs, rubs, or gallops, no edema  Respiratory:  clear to auscultation bilaterally, normal work of breathing GI: soft, nontender, nondistended, + BS MS: no deformity or atrophy  Skin: warm and dry, no rash Neuro:  Alert and Oriented x 3, Strength and sensation are intact, follows commands Psych: euthymic mood, full affect  Wt Readings from Last 3 Encounters:  04/11/16 249 lb (112.9 kg)  03/25/16 248 lb (112.5 kg)  03/16/16 250 lb (113.4 kg)      Studies/Labs Reviewed:   EKG: EKG was not ordered today.  Recent Labs: 01/05/2016: B Natriuretic Peptide 102.5 01/06/2016: Magnesium 2.4 03/15/2016: ALT 33; BUN 17; Creatinine, Ser 0.92; Hemoglobin 14.5; Platelets 195.0; Potassium 4.4; Sodium 134; TSH 1.29   Lipid Panel    Component Value Date/Time   CHOL 203 (H) 07/07/2015 1141   CHOL 159 03/27/2013 0818   TRIG 111.0 07/07/2015 1141   TRIG 127 03/27/2013 0818   HDL 44.00 07/07/2015 1141   HDL 45 03/27/2013 0818   CHOLHDL 5 07/07/2015 1141   VLDL 22.2 07/07/2015 1141   LDLCALC 136 (H) 07/07/2015 1141   LDLCALC 89 03/27/2013 0818   LDLDIRECT 87.6 09/19/2012 1206    Additional studies/ records that were reviewed today include: Summarized  above    ASSESSMENT & PLAN:   1. Orthostatic hypotension/labile hypertension - improved after cessation of nifedipine. Would aim for a goal of 536-144 systolic without significant aggressive lowering given his tendency towards orthostasis. Continue losartan. Flomax could also be contributing to orthostasis. His wife said his urologist is looking into a possible drug interaction between his urologic meds and seizure medications - she will review with them further. Orthostatic precautions reviewed. 2. Chest pain - recent nuc normal, unlikely cardiac in nature. Continue to observe. 3. Sinus bradycardia - HR stable. If dizziness worsens or loses its orthostatic correlation, could consider event monitoring. 4. Chronic diastolic CHF - appears euvolemic. Maintain BP control. We also discussed remaining physically active as tolerated. 5. Obstructive sleep apnea - The patient has f/u coming up at the [place] soon to review his CPAP therapy and sleep apnea. He is inconsistent with use which could also contribute to BP lability.  Disposition: F/u with Dr. [name] in 3 months.   Medication Adjustments/Labs and Tests Ordered: Current medicines are reviewed at length with the patient today.  Concerns regarding medicines are outlined above. Medication changes, Labs and Tests ordered today are summarized above and listed in the Patient Instructions accessible in Encounters.   Raechel Ache PA-C  04/11/2016 12:01 PM    [facility] [address], [place], [place]  [address] Phone: [phone]; Fax: [phone]

## 2016-04-11 NOTE — Patient Instructions (Signed)
**Note De-Identified  Obfuscation** Medication Instructions:  Same-no changes  Labwork: None  Testing/Procedures: None  Follow-Up: Your physician wants you to follow-up in: 3 months. You will receive a reminder letter in the mail two months in advance. If you don't receive a letter, please call our office to schedule the follow-up appointment.     If you need a refill on your cardiac medications before your next appointment, please call your pharmacy.

## 2016-04-13 ENCOUNTER — Encounter: Payer: Self-pay | Admitting: Cardiology

## 2016-04-13 DIAGNOSIS — G9389 Other specified disorders of brain: Secondary | ICD-10-CM | POA: Diagnosis not present

## 2016-04-13 DIAGNOSIS — I671 Cerebral aneurysm, nonruptured: Secondary | ICD-10-CM | POA: Diagnosis not present

## 2016-04-15 DIAGNOSIS — I671 Cerebral aneurysm, nonruptured: Secondary | ICD-10-CM | POA: Diagnosis not present

## 2016-04-15 DIAGNOSIS — R2689 Other abnormalities of gait and mobility: Secondary | ICD-10-CM | POA: Diagnosis not present

## 2016-04-15 DIAGNOSIS — Z9889 Other specified postprocedural states: Secondary | ICD-10-CM | POA: Diagnosis not present

## 2016-04-15 DIAGNOSIS — R509 Fever, unspecified: Secondary | ICD-10-CM | POA: Diagnosis not present

## 2016-04-15 DIAGNOSIS — R569 Unspecified convulsions: Secondary | ICD-10-CM | POA: Diagnosis not present

## 2016-04-15 DIAGNOSIS — G9763 Postprocedural seroma of a nervous system organ or structure following a nervous system procedure: Secondary | ICD-10-CM | POA: Diagnosis not present

## 2016-04-15 DIAGNOSIS — R109 Unspecified abdominal pain: Secondary | ICD-10-CM | POA: Diagnosis not present

## 2016-04-15 DIAGNOSIS — I6911 Attention and concentration deficit following nontraumatic intracerebral hemorrhage: Secondary | ICD-10-CM | POA: Diagnosis not present

## 2016-04-15 DIAGNOSIS — Z982 Presence of cerebrospinal fluid drainage device: Secondary | ICD-10-CM | POA: Diagnosis not present

## 2016-04-15 DIAGNOSIS — I69111 Memory deficit following nontraumatic intracerebral hemorrhage: Secondary | ICD-10-CM | POA: Diagnosis not present

## 2016-04-15 DIAGNOSIS — I69128 Other speech and language deficits following nontraumatic intracerebral hemorrhage: Secondary | ICD-10-CM | POA: Diagnosis not present

## 2016-04-15 DIAGNOSIS — I69398 Other sequelae of cerebral infarction: Secondary | ICD-10-CM | POA: Diagnosis not present

## 2016-04-15 DIAGNOSIS — Z4542 Encounter for adjustment and management of neuropacemaker (brain) (peripheral nerve) (spinal cord): Secondary | ICD-10-CM | POA: Diagnosis not present

## 2016-04-17 ENCOUNTER — Encounter: Payer: Self-pay | Admitting: Internal Medicine

## 2016-04-17 DIAGNOSIS — I10 Essential (primary) hypertension: Secondary | ICD-10-CM

## 2016-04-17 DIAGNOSIS — R569 Unspecified convulsions: Secondary | ICD-10-CM

## 2016-04-17 DIAGNOSIS — R350 Frequency of micturition: Secondary | ICD-10-CM

## 2016-04-18 MED FILL — LOSARTAN POTASSIUM 50 MG TA: 50 | 90 days supply | Qty: 90 | Fill #1

## 2016-04-18 NOTE — Telephone Encounter (Signed)
Labs ordered.

## 2016-04-18 NOTE — Telephone Encounter (Signed)
Please advise what should be entered and I will enter labs.

## 2016-04-19 ENCOUNTER — Encounter: Payer: Self-pay | Admitting: Internal Medicine

## 2016-04-19 ENCOUNTER — Other Ambulatory Visit (INDEPENDENT_AMBULATORY_CARE_PROVIDER_SITE_OTHER): Payer: Medicare Other

## 2016-04-19 DIAGNOSIS — R569 Unspecified convulsions: Secondary | ICD-10-CM

## 2016-04-19 DIAGNOSIS — I10 Essential (primary) hypertension: Secondary | ICD-10-CM

## 2016-04-19 DIAGNOSIS — R350 Frequency of micturition: Secondary | ICD-10-CM

## 2016-04-19 LAB — URINALYSIS, ROUTINE W REFLEX MICROSCOPIC
Bilirubin Urine: NEGATIVE
Hgb urine dipstick: NEGATIVE
Ketones, ur: NEGATIVE
Leukocytes, UA: NEGATIVE
Nitrite: NEGATIVE
Specific Gravity, Urine: 1.015 (ref 1.000–1.030)
Total Protein, Urine: 30 — AB
Urine Glucose: NEGATIVE
Urobilinogen, UA: 0.2 (ref 0.0–1.0)
pH: 7.5 (ref 5.0–8.0)

## 2016-04-19 LAB — CBC WITH DIFFERENTIAL/PLATELET
Basophils Absolute: 0 10*3/uL (ref 0.0–0.1)
Basophils Relative: 0 % (ref 0.0–3.0)
Eosinophils Absolute: 0.2 10*3/uL (ref 0.0–0.7)
Eosinophils Relative: 2.1 % (ref 0.0–5.0)
HCT: 43.9 % (ref 39.0–52.0)
Hemoglobin: 15.2 g/dL (ref 13.0–17.0)
Lymphocytes Relative: 17.2 % (ref 12.0–46.0)
Lymphs Abs: 1.3 10*3/uL (ref 0.7–4.0)
MCHC: 34.5 g/dL (ref 30.0–36.0)
MCV: 89.2 fl (ref 78.0–100.0)
Monocytes Absolute: 0.7 10*3/uL (ref 0.1–1.0)
Monocytes Relative: 9.2 % (ref 3.0–12.0)
Neutro Abs: 5.5 10*3/uL (ref 1.4–7.7)
Neutrophils Relative %: 71.5 % (ref 43.0–77.0)
Platelets: 185 10*3/uL (ref 150.0–400.0)
RBC: 4.93 Mil/uL (ref 4.22–5.81)
RDW: 13.6 % (ref 11.5–15.5)
WBC: 7.7 10*3/uL (ref 4.0–10.5)

## 2016-04-19 LAB — COMPREHENSIVE METABOLIC PANEL
ALT: 34 U/L (ref 0–53)
AST: 30 U/L (ref 0–37)
Albumin: 4.3 g/dL (ref 3.5–5.2)
Alkaline Phosphatase: 58 U/L (ref 39–117)
BUN: 16 mg/dL (ref 6–23)
CO2: 31 mEq/L (ref 19–32)
Calcium: 9.5 mg/dL (ref 8.4–10.5)
Chloride: 96 mEq/L (ref 96–112)
Creatinine, Ser: 1.13 mg/dL (ref 0.40–1.50)
GFR: 67.81 mL/min (ref >60.00)
Glucose, Bld: 94 mg/dL (ref 70–99)
Potassium: 4.3 mEq/L (ref 3.5–5.1)
Sodium: 133 mEq/L — ABNORMAL LOW (ref 135–145)
Total Bilirubin: 0.4 mg/dL (ref 0.2–1.2)
Total Protein: 6.9 g/dL (ref 6.0–8.3)

## 2016-04-19 LAB — MAGNESIUM: Magnesium: 2 mg/dL (ref 1.5–2.5)

## 2016-04-20 LAB — URINE CULTURE

## 2016-04-22 DIAGNOSIS — Z789 Other specified health status: Secondary | ICD-10-CM | POA: Diagnosis not present

## 2016-04-22 DIAGNOSIS — R279 Unspecified lack of coordination: Secondary | ICD-10-CM | POA: Diagnosis not present

## 2016-04-22 DIAGNOSIS — R2681 Unsteadiness on feet: Secondary | ICD-10-CM | POA: Diagnosis not present

## 2016-04-22 DIAGNOSIS — R269 Unspecified abnormalities of gait and mobility: Secondary | ICD-10-CM | POA: Diagnosis not present

## 2016-04-22 DIAGNOSIS — R5381 Other malaise: Secondary | ICD-10-CM | POA: Diagnosis not present

## 2016-04-22 DIAGNOSIS — R4189 Other symptoms and signs involving cognitive functions and awareness: Secondary | ICD-10-CM | POA: Diagnosis not present

## 2016-04-26 ENCOUNTER — Other Ambulatory Visit: Payer: Self-pay | Admitting: Internal Medicine

## 2016-04-26 DIAGNOSIS — Z982 Presence of cerebrospinal fluid drainage device: Secondary | ICD-10-CM | POA: Diagnosis not present

## 2016-04-26 DIAGNOSIS — R41841 Cognitive communication deficit: Secondary | ICD-10-CM | POA: Diagnosis not present

## 2016-04-26 DIAGNOSIS — Z8673 Personal history of transient ischemic attack (TIA), and cerebral infarction without residual deficits: Secondary | ICD-10-CM | POA: Diagnosis not present

## 2016-04-26 DIAGNOSIS — I63512 Cerebral infarction due to unspecified occlusion or stenosis of left middle cerebral artery: Secondary | ICD-10-CM | POA: Diagnosis not present

## 2016-04-26 DIAGNOSIS — Z79899 Other long term (current) drug therapy: Secondary | ICD-10-CM | POA: Diagnosis not present

## 2016-04-26 DIAGNOSIS — I10 Essential (primary) hypertension: Secondary | ICD-10-CM | POA: Diagnosis not present

## 2016-04-26 DIAGNOSIS — I6789 Other cerebrovascular disease: Secondary | ICD-10-CM | POA: Diagnosis not present

## 2016-04-26 DIAGNOSIS — Z8679 Personal history of other diseases of the circulatory system: Secondary | ICD-10-CM | POA: Diagnosis not present

## 2016-04-26 DIAGNOSIS — Z87891 Personal history of nicotine dependence: Secondary | ICD-10-CM | POA: Diagnosis not present

## 2016-04-26 DIAGNOSIS — G4733 Obstructive sleep apnea (adult) (pediatric): Secondary | ICD-10-CM | POA: Diagnosis not present

## 2016-04-26 DIAGNOSIS — G40209 Localization-related (focal) (partial) symptomatic epilepsy and epileptic syndromes with complex partial seizures, not intractable, without status epilepticus: Secondary | ICD-10-CM | POA: Diagnosis not present

## 2016-04-26 DIAGNOSIS — E785 Hyperlipidemia, unspecified: Secondary | ICD-10-CM | POA: Diagnosis not present

## 2016-04-26 DIAGNOSIS — Z888 Allergy status to other drugs, medicaments and biological substances status: Secondary | ICD-10-CM | POA: Diagnosis not present

## 2016-04-26 DIAGNOSIS — G40109 Localization-related (focal) (partial) symptomatic epilepsy and epileptic syndromes with simple partial seizures, not intractable, without status epilepticus: Secondary | ICD-10-CM | POA: Diagnosis not present

## 2016-04-26 DIAGNOSIS — I671 Cerebral aneurysm, nonruptured: Secondary | ICD-10-CM | POA: Diagnosis not present

## 2016-04-27 DIAGNOSIS — R4189 Other symptoms and signs involving cognitive functions and awareness: Secondary | ICD-10-CM | POA: Diagnosis not present

## 2016-04-27 DIAGNOSIS — R5381 Other malaise: Secondary | ICD-10-CM | POA: Diagnosis not present

## 2016-04-27 DIAGNOSIS — R269 Unspecified abnormalities of gait and mobility: Secondary | ICD-10-CM | POA: Diagnosis not present

## 2016-04-27 DIAGNOSIS — R279 Unspecified lack of coordination: Secondary | ICD-10-CM | POA: Diagnosis not present

## 2016-04-27 DIAGNOSIS — Z789 Other specified health status: Secondary | ICD-10-CM | POA: Diagnosis not present

## 2016-04-27 DIAGNOSIS — R2681 Unsteadiness on feet: Secondary | ICD-10-CM | POA: Diagnosis not present

## 2016-04-29 DIAGNOSIS — R279 Unspecified lack of coordination: Secondary | ICD-10-CM | POA: Diagnosis not present

## 2016-04-29 DIAGNOSIS — R269 Unspecified abnormalities of gait and mobility: Secondary | ICD-10-CM | POA: Diagnosis not present

## 2016-04-29 DIAGNOSIS — R5381 Other malaise: Secondary | ICD-10-CM | POA: Diagnosis not present

## 2016-04-29 DIAGNOSIS — R2681 Unsteadiness on feet: Secondary | ICD-10-CM | POA: Diagnosis not present

## 2016-05-04 DIAGNOSIS — R279 Unspecified lack of coordination: Secondary | ICD-10-CM | POA: Diagnosis not present

## 2016-05-04 DIAGNOSIS — R2681 Unsteadiness on feet: Secondary | ICD-10-CM | POA: Diagnosis not present

## 2016-05-04 DIAGNOSIS — Z789 Other specified health status: Secondary | ICD-10-CM | POA: Diagnosis not present

## 2016-05-04 DIAGNOSIS — R269 Unspecified abnormalities of gait and mobility: Secondary | ICD-10-CM | POA: Diagnosis not present

## 2016-05-04 DIAGNOSIS — R5381 Other malaise: Secondary | ICD-10-CM | POA: Diagnosis not present

## 2016-05-04 DIAGNOSIS — R4189 Other symptoms and signs involving cognitive functions and awareness: Secondary | ICD-10-CM | POA: Diagnosis not present

## 2016-05-06 DIAGNOSIS — R2681 Unsteadiness on feet: Secondary | ICD-10-CM | POA: Diagnosis not present

## 2016-05-06 DIAGNOSIS — R279 Unspecified lack of coordination: Secondary | ICD-10-CM | POA: Diagnosis not present

## 2016-05-06 DIAGNOSIS — R5381 Other malaise: Secondary | ICD-10-CM | POA: Diagnosis not present

## 2016-05-06 DIAGNOSIS — R269 Unspecified abnormalities of gait and mobility: Secondary | ICD-10-CM | POA: Diagnosis not present

## 2016-05-09 DIAGNOSIS — R269 Unspecified abnormalities of gait and mobility: Secondary | ICD-10-CM | POA: Diagnosis not present

## 2016-05-09 DIAGNOSIS — R5381 Other malaise: Secondary | ICD-10-CM | POA: Diagnosis not present

## 2016-05-09 DIAGNOSIS — R2681 Unsteadiness on feet: Secondary | ICD-10-CM | POA: Diagnosis not present

## 2016-05-09 DIAGNOSIS — R279 Unspecified lack of coordination: Secondary | ICD-10-CM | POA: Diagnosis not present

## 2016-05-09 DIAGNOSIS — G919 Hydrocephalus, unspecified: Secondary | ICD-10-CM | POA: Diagnosis not present

## 2016-05-10 ENCOUNTER — Encounter: Payer: Self-pay | Admitting: Neurology

## 2016-05-10 ENCOUNTER — Ambulatory Visit (INDEPENDENT_AMBULATORY_CARE_PROVIDER_SITE_OTHER): Payer: Medicare Other | Admitting: Neurology

## 2016-05-10 VITALS — BP 120/72 | HR 58 | Ht 68.0 in | Wt 250.1 lb

## 2016-05-10 DIAGNOSIS — G40209 Localization-related (focal) (partial) symptomatic epilepsy and epileptic syndromes with complex partial seizures, not intractable, without status epilepticus: Secondary | ICD-10-CM

## 2016-05-10 DIAGNOSIS — Z9889 Other specified postprocedural states: Secondary | ICD-10-CM

## 2016-05-10 DIAGNOSIS — Z8679 Personal history of other diseases of the circulatory system: Secondary | ICD-10-CM | POA: Diagnosis not present

## 2016-05-10 NOTE — Patient Instructions (Signed)
1. Agree with medication plan to further streamline off Aptiom 2. Proceed with Neuropsychological evaluation 3. Discuss depression with Dr. Quay Burow 4. Follow-up with [facility] as scheduled, call our office for any questions

## 2016-05-10 NOTE — Progress Notes (Signed)
NEUROLOGY CONSULTATION NOTE  Jeff Weaver MRN: [id number] DOB: 10/27/1945  Referring provider: Dr. Billey Gosling Primary care provider: Dr. Billey Gosling  Reason for consult:  Second opinion on seizure medications  Dear Dr Quay Burow:  Thank you for your kind referral of Jeff Weaver for consultation of the above symptoms. Although his history is well known to you, please allow me to reiterate it for the purpose of our medical record. The patient was accompanied to the clinic by his wife and daughter who also provide collateral information. Records and images were personally reviewed where available.  HISTORY OF PRESENT ILLNESS: This is a pleasant 70 year old right-handed man with a history of hypertension, hyperlipidemia, sleep apnea, left frontotemporal cerebral hemorrhage, clipped Acom aneurysm, development of pseudomeningocele s/p shunt, and subsequent seizures, presenting for second opinion regarding his seizure medications. His wife prefaced the visit to indicate that they are happy with care at [facility] and would like to continue follow-up there, they just wanted to make sure he is on the right medications. He started having seizures in in 2014. Seizures are described as behavioral arrest, he would keep looking at his wife but have right gaze preference, then has clonic movement of his head to the right side. Sometimes he drools or bites his tongue. With the last seizure last month, he had right arm clonic movements and for the first time his wife saw him have bicycling movements of both legs. He gets confused after and frustrated because he is amnestic of events. He initially took Dilantin for around 2 months. He was then switched to [place] and Vimpat was added on. For the first 3 years, he was having seizures every 6 months or so, then a year ago he seemed to be having more seizures and his wife requested to start Aptiom after reading about it. They briefly tried Briviact but he was asleep and  tired all the time. With the Aptiom, family noticed he started having visual hallucinations with starting Aptiom, he would remember things that actually did not occur. He was tapered off Vimpat but had another seizure last 04/17/16 and Vimpat was restarted at 166m BID. Aptiom dose was reduced to 4068mBID. With lower dose, his family reports they definitely saw an immediate change, however were not sure if this was due to other factors as well, he had seen his neurosurgeon and his shunt was dialed down to 80 around the same time. He also had cataract surgery last month. He continued to improve, the visual hallucinations were better, his balance and gait were better. When he was on Keppra and Vimpat, they do not recall much side effects except for some irritability. His wife also reported worsening memory after gall bladder surgery in July. A 72-hour ambulatory EEG did not show any subclinical seizures as the cause of worsening cognition. He is scheduled for Neuropsychological evaluation this month. He does endorse depression, with note [facility] [place] [facility] [place] [place] it [place] him when his family tells him frequently to "wait a minute," he has difficulty with someone else telling him what to do, wanting to be in charge. He has noticed difficulty with writing, which frustrates him. He has lost a lot of energy and states he has lost the ability to carry on a conversation, "I turn you off." His family though reports that they are seeing his "joker" personality coming back more and more with the reduction in Aptiom. He  keeps asking today why he needs to take BP medication when his BP is low, and has difficulty understanding the explanation for this. He has had hearing aids for 2 years, but family has noticed he has lost more hearing recently and wonders if this is medication-related.   Diagnostic Data: EMU admission in 2013 captured six left frontal onset seizures, including  a period of focal onset status epilepticus. This corresponded to a period of unresponsiveness. Ghere was generalized slowing with left focal slowing.  72-hour EEG 03/19/16 to 03/22/16: Abnormal due to slowing of the background into the theta range. Left hemisphere slowing maximal in the frontal regions. Higher amplitudes and faster frequencies over the left hemisphere. Push button for confused behaviors, thoughts, "mind going blank," word-finding difficulties, did not show EEG change.  MRI brain without contrast done 02/12/16 reported post surgical changes of craniotomy and clipping of the left anterior communicating artery aneurysm. Similar appearing cystic encephalomalacia and gliosis of the left frontal and temporal lobes and insular cortex; subtle asymmetric enlargement of the temporal horn of the left alteral ventricle may reflect ex vacuo dilatation. Scattered subcortical and periventricluar white mattion lesions may reflect chronic microvascular changes. No acute/subacute infarct.  PAST MEDICAL HISTORY: Past Medical History:  Diagnosis Date  . Aortic insufficiency   . Cerebral aneurysm    a. s/p clipping at [facility] in 8/13 c/b short-term memory loss, cerebral hemorrhage and seizure disorder s/p VP shunt.  . Cerebral hemorrhage ([place])   . Chronic diastolic CHF (congestive heart failure) ([place])   . CVA (cerebral infarction)   . Diverticulosis   . Enteritis (regional)    Dr Olevia Perches  . First degree AV block   . GI bleed   . HTN (hypertension)   . Hyperlipidemia   . Hypertensive heart disease   . IBS (irritable bowel syndrome)   . Orthostatic hypotension   . Pre-diabetes   . 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 Rheumatoid arthritis(714.0)    dxed in [employer] in 1980s  . Seizures ([place])   . Sinus bradycardia   . Sleep apnea     PAST SURGICAL HISTORY: Past Surgical History:  Procedure Laterality Date  . CHOLECYSTECTOMY N/A 01/07/2016   Procedure: LAPAROSCOPIC  CHOLECYSTECTOMY ;  Surgeon: Coralie Keens, MD;  Location: [facility];  Service: General;  Laterality: N/A;  . cns shunt  02/23/12  . COLONOSCOPY W/ POLYPECTOMY  1978   negative 2009, Dr Olevia Perches. Due 2014  . CRANIOTOMY  02/02/12   Dr Harvel Ricks, [place] of aneurysm  . 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 HERNIA REPAIR    . LEFT HEART CATHETERIZATION WITH CORONARY ANGIOGRAM N/A 11/26/2013   Procedure: LEFT HEART CATHETERIZATION WITH CORONARY ANGIOGRAM;  Surgeon: [name], MD;  Location: [facility] CATH LAB;  Service: Cardiovascular;  Laterality: N/A;  . NOSE SURGERY    . SHOULDER SURGERY  1997  . TONSILLECTOMY AND ADENOIDECTOMY    . VENTRICULOPERITONEAL SHUNT  02-23-12   INSERTION OF RIGHT FRONTAL VENTRICULOPERITONEAL SHUNT WITH A CODMAN HAKIM PROGRAMMABLE VALVE    MEDICATIONS:  Outpatient Encounter Prescriptions as of 05/10/2016  Medication Sig Note  . blood glucose meter kit and supplies KIT Dispense based on patient and insurance preference. Use up to four times daily as directed. (prediabetes hypoglycemia).   . cetirizine (ZYRTEC) 10 MG tablet Take 1 tablet (10 mg total) by mouth daily.   . Eslicarbazepine Acetate 400 MG TABS Take  400 mg by mouth 2 (two) times daily.   Marland Kitchen levETIRAcetam (KEPPRA) 500 MG tablet Take 500 mg by mouth 2 (two) times daily.    Marland Kitchen losartan (COZAAR) 50 MG tablet Take 1 tablet (50 mg total) by mouth daily.   . mirabegron ER (MYRBETRIQ) 25 MG TB24 tablet Take 25 mg by mouth.   . NON FORMULARY CPAP Machine: authorized by [facility] in [place]   . pyridoxine (B-6) 100 MG tablet Take 100 mg by mouth daily.   . Tamsulosin HCl (FLOMAX) 0.4 MG CAPS Take 0.8 mg by mouth at bedtime.    . thiamine 100 MG tablet Take 100 mg by mouth daily.   . Lacosamide 100 MG TABS Take by mouth 2 (two) times daily. 05/10/2016: Received from: [place]: Take 100 mg by mouth 2 (two) times daily.  . Melatonin 3 MG  TABS Take 3 mg by mouth at bedtime.   . Multiple Vitamins-Minerals (MULTIVITAMIN) tablet Take 1 tablet by mouth daily. (Patient not taking: Reported on 05/10/2016)   . ranitidine (ZANTAC) 150 MG capsule TAKE 1 CAPSULE EVERY MORNING (Patient not taking: Reported on 05/10/2016)    No facility-administered encounter medications on file as of 05/10/2016.     ALLERGIES: Allergies  Allergen Reactions  . Haloperidol Other (See Comments)    Hallucinations  . Lisinopril     Cough   . Crestor [Rosuvastatin]     Leg pain mainly in knees   . Pravastatin     Rxed by [facility] 2009; "myositis" Ok to take per pt's wife/Dr. Linna Darner  . Simvastatin     2007: nausea & vomiting ; [facility] ( he does not remember such)    FAMILY HISTORY: Family History  Problem Relation Age of Onset  . COPD Father   . Coronary artery disease Father     MI in 29s  . Hepatitis Mother   . Diabetes Sister   . Dementia Sister 71  . Diabetes Brother   . Colon cancer Neg Hx     SOCIAL HISTORY: Social History   Social History  . Marital status: Married    Spouse name: N/A  . Number of children: 1  . Years of education: N/A   Occupational History  . retired    Social History Main Topics  . Smoking status: Former Smoker    Quit date: 07/04/2009  . Smokeless tobacco: Never Used  . Alcohol use 0.0 oz/week     Comment: Very Infrequently   . Drug use: No  . Sexual activity: Not on file   Other Topics Concern  . Not on file   Social History Narrative  . No narrative on file    REVIEW OF SYSTEMS: Constitutional: No fevers, chills, or sweats, no generalized fatigue, change in appetite Eyes: No visual changes, double vision, eye pain Ear, nose and throat: No hearing loss, ear pain, nasal congestion, sore throat Cardiovascular: No chest pain, palpitations Respiratory:  No shortness of breath at rest or with exertion, wheezes GastrointestinaI: No nausea, vomiting, diarrhea, abdominal pain, fecal  incontinence Genitourinary:  No dysuria, urinary retention or frequency Musculoskeletal:  No neck pain, back pain Integumentary: No rash, pruritus, skin lesions Neurological: as above Psychiatric: + depression, no insomnia, anxiety Endocrine: No palpitations, fatigue, diaphoresis, mood swings, change in appetite, change in weight, increased thirst Hematologic/Lymphatic:  No anemia, purpura, petechiae. Allergic/Immunologic: no itchy/runny eyes, nasal congestion, recent allergic reactions, rashes  PHYSICAL EXAM: Vitals:   05/10/16 0901  BP: 120/72  Pulse: Marland Kitchen)  48   General: No acute distress Head:  Normocephalic, s/p craniotomy on left Eyes: Fundoscopic exam shows bilateral sharp discs, no vessel changes, exudates, or hemorrhages Neck: supple, no paraspinal tenderness, full range of motion Back: No paraspinal tenderness Heart: regular rate and rhythm Lungs: Clear to auscultation bilaterally. Vascular: No carotid bruits. Skin/Extremities: No rash, no edema Neurological Exam: Mental status: alert and oriented to person, place, and time, no dysarthria or aphasia, Fund of knowledge is appropriate.  Recent and remote memory are intact. 2/3 delayed recall.  Attention and concentration are normal.    Able to name objects and repeat phrases. Cranial nerves: CN I: not tested CN II: pupils equal, round and reactive to light, visual fields intact, fundi unremarkable. CN III, IV, VI:  full range of motion, no nystagmus, no ptosis CN V: facial sensation intact CN VII: upper and lower face symmetric CN VIII: hearing intact to finger rub CN IX, X: gag intact, uvula midline CN XI: sternocleidomastoid and trapezius muscles intact CN XII: tongue midline Bulk & Tone: normal, no fasciculations. Motor: 5/5 throughout with no pronator drift. Sensation: intact to light touch, cold, pin, vibration and joint position sense.  No extinction to double simultaneous stimulation.  Romberg test negative Deep  Tendon Reflexes: +1 throughout, no ankle clonus Plantar responses: downgoing bilaterally Cerebellar: no incoordination on finger to nose, heel to shin. No dysdiadochokinesia Gait: narrow-based and steady, able to tandem walk adequately. Tremor: none  IMPRESSION: This is a 69 year old right-handed man with a history of hypertension, hyperlipidemia, sleep apnea, left frontotemporal cerebral hemorrhage, clipped Acom aneurysm, development of pseudomeningocele s/p shunt, and subsequent seizures, presenting for second opinion regarding his seizure medications. They discussed their concerns regarding the Aptiom, as well as improvement noticed on lower dose, but also stating that his shunt settings were changed and he had cataract surgery around the same time. It is certainly difficulty to ascertain which change made the most difference, but they have noticed a reduction in hallucinations and cognitive changes that started after Aptiom introduction. It appears his wife had requested for Aptiom to be added on, and he had been doing fairly well on Keppra and Vimpat previously except for some irritability. Records from [place] indicate that there are plans to further reduce Aptiom, which I discussed with his family that I would do the same. There is room to further maximize Vimpat as well if needed. Agree with Neuropsychological evaluation, he reported feeling depressed as well as frustration with seeing so many doctors. All their questions and concerns were answered to the best of my ability. They would like to continue Epilepsy care at [facility] and will follow-up on a prn basis here. He is aware of [place] driving laws to stop driving after a seizure until 6 months seizure-free. Family knows to call for any questions.   Thank you for allowing me to participate in the care of this patient. Please do not hesitate to call for any questions or concerns.   Ellouise Newer, M.D.  CC: Dr. Quay Burow

## 2016-05-11 DIAGNOSIS — R279 Unspecified lack of coordination: Secondary | ICD-10-CM | POA: Diagnosis not present

## 2016-05-11 DIAGNOSIS — R5381 Other malaise: Secondary | ICD-10-CM | POA: Diagnosis not present

## 2016-05-11 DIAGNOSIS — R269 Unspecified abnormalities of gait and mobility: Secondary | ICD-10-CM | POA: Diagnosis not present

## 2016-05-11 DIAGNOSIS — R2681 Unsteadiness on feet: Secondary | ICD-10-CM | POA: Diagnosis not present

## 2016-05-13 DIAGNOSIS — F321 Major depressive disorder, single episode, moderate: Secondary | ICD-10-CM | POA: Diagnosis not present

## 2016-05-13 DIAGNOSIS — G40209 Localization-related (focal) (partial) symptomatic epilepsy and epileptic syndromes with complex partial seizures, not intractable, without status epilepticus: Secondary | ICD-10-CM | POA: Diagnosis not present

## 2016-05-13 DIAGNOSIS — Z87891 Personal history of nicotine dependence: Secondary | ICD-10-CM | POA: Diagnosis not present

## 2016-05-13 DIAGNOSIS — H9190 Unspecified hearing loss, unspecified ear: Secondary | ICD-10-CM | POA: Diagnosis not present

## 2016-05-13 DIAGNOSIS — F329 Major depressive disorder, single episode, unspecified: Secondary | ICD-10-CM | POA: Diagnosis not present

## 2016-05-13 DIAGNOSIS — Z79899 Other long term (current) drug therapy: Secondary | ICD-10-CM | POA: Diagnosis not present

## 2016-05-13 DIAGNOSIS — I10 Essential (primary) hypertension: Secondary | ICD-10-CM | POA: Diagnosis not present

## 2016-05-13 DIAGNOSIS — R569 Unspecified convulsions: Secondary | ICD-10-CM | POA: Diagnosis not present

## 2016-05-13 DIAGNOSIS — Z8659 Personal history of other mental and behavioral disorders: Secondary | ICD-10-CM | POA: Diagnosis not present

## 2016-05-13 DIAGNOSIS — Z8673 Personal history of transient ischemic attack (TIA), and cerebral infarction without residual deficits: Secondary | ICD-10-CM | POA: Diagnosis not present

## 2016-05-13 DIAGNOSIS — F09 Unspecified mental disorder due to known physiological condition: Secondary | ICD-10-CM | POA: Diagnosis not present

## 2016-05-13 DIAGNOSIS — E785 Hyperlipidemia, unspecified: Secondary | ICD-10-CM | POA: Diagnosis not present

## 2016-05-13 DIAGNOSIS — Z982 Presence of cerebrospinal fluid drainage device: Secondary | ICD-10-CM | POA: Diagnosis not present

## 2016-05-13 DIAGNOSIS — G4733 Obstructive sleep apnea (adult) (pediatric): Secondary | ICD-10-CM | POA: Diagnosis not present

## 2016-05-16 DIAGNOSIS — R5381 Other malaise: Secondary | ICD-10-CM | POA: Diagnosis not present

## 2016-05-16 DIAGNOSIS — R269 Unspecified abnormalities of gait and mobility: Secondary | ICD-10-CM | POA: Diagnosis not present

## 2016-05-16 DIAGNOSIS — R2681 Unsteadiness on feet: Secondary | ICD-10-CM | POA: Diagnosis not present

## 2016-05-16 DIAGNOSIS — R279 Unspecified lack of coordination: Secondary | ICD-10-CM | POA: Diagnosis not present

## 2016-05-17 DIAGNOSIS — R41841 Cognitive communication deficit: Secondary | ICD-10-CM | POA: Diagnosis not present

## 2016-05-17 DIAGNOSIS — I63512 Cerebral infarction due to unspecified occlusion or stenosis of left middle cerebral artery: Secondary | ICD-10-CM | POA: Diagnosis not present

## 2016-05-20 DIAGNOSIS — R269 Unspecified abnormalities of gait and mobility: Secondary | ICD-10-CM | POA: Diagnosis not present

## 2016-05-20 DIAGNOSIS — L03031 Cellulitis of right toe: Secondary | ICD-10-CM | POA: Diagnosis not present

## 2016-05-20 DIAGNOSIS — R279 Unspecified lack of coordination: Secondary | ICD-10-CM | POA: Diagnosis not present

## 2016-05-20 DIAGNOSIS — R2681 Unsteadiness on feet: Secondary | ICD-10-CM | POA: Diagnosis not present

## 2016-05-20 DIAGNOSIS — R5381 Other malaise: Secondary | ICD-10-CM | POA: Diagnosis not present

## 2016-05-20 DIAGNOSIS — M722 Plantar fascial fibromatosis: Secondary | ICD-10-CM | POA: Diagnosis not present

## 2016-05-20 MED FILL — DOXYCYCLINE HYCLATE 100 MG: 100 | 10 days supply | Qty: 20 | Fill #0

## 2016-05-20 MED FILL — AMOXICILLIN 500 MG CAPSULE: 500 | 2 days supply | Qty: 8 | Fill #1

## 2016-05-24 DIAGNOSIS — R41841 Cognitive communication deficit: Secondary | ICD-10-CM | POA: Diagnosis not present

## 2016-05-24 DIAGNOSIS — I63512 Cerebral infarction due to unspecified occlusion or stenosis of left middle cerebral artery: Secondary | ICD-10-CM | POA: Diagnosis not present

## 2016-05-25 DIAGNOSIS — R2681 Unsteadiness on feet: Secondary | ICD-10-CM | POA: Diagnosis not present

## 2016-05-25 DIAGNOSIS — R279 Unspecified lack of coordination: Secondary | ICD-10-CM | POA: Diagnosis not present

## 2016-05-25 DIAGNOSIS — R269 Unspecified abnormalities of gait and mobility: Secondary | ICD-10-CM | POA: Diagnosis not present

## 2016-05-25 DIAGNOSIS — R5381 Other malaise: Secondary | ICD-10-CM | POA: Diagnosis not present

## 2016-05-30 DIAGNOSIS — E785 Hyperlipidemia, unspecified: Secondary | ICD-10-CM | POA: Diagnosis not present

## 2016-05-30 DIAGNOSIS — Z982 Presence of cerebrospinal fluid drainage device: Secondary | ICD-10-CM | POA: Diagnosis not present

## 2016-05-30 DIAGNOSIS — I10 Essential (primary) hypertension: Secondary | ICD-10-CM | POA: Diagnosis not present

## 2016-05-30 DIAGNOSIS — I63512 Cerebral infarction due to unspecified occlusion or stenosis of left middle cerebral artery: Secondary | ICD-10-CM | POA: Diagnosis not present

## 2016-05-30 DIAGNOSIS — F09 Unspecified mental disorder due to known physiological condition: Secondary | ICD-10-CM | POA: Diagnosis not present

## 2016-05-30 DIAGNOSIS — G40209 Localization-related (focal) (partial) symptomatic epilepsy and epileptic syndromes with complex partial seizures, not intractable, without status epilepticus: Secondary | ICD-10-CM | POA: Diagnosis not present

## 2016-05-30 DIAGNOSIS — G4733 Obstructive sleep apnea (adult) (pediatric): Secondary | ICD-10-CM | POA: Diagnosis not present

## 2016-05-30 DIAGNOSIS — Z8673 Personal history of transient ischemic attack (TIA), and cerebral infarction without residual deficits: Secondary | ICD-10-CM | POA: Diagnosis not present

## 2016-05-30 DIAGNOSIS — I671 Cerebral aneurysm, nonruptured: Secondary | ICD-10-CM | POA: Diagnosis not present

## 2016-05-30 DIAGNOSIS — Z87891 Personal history of nicotine dependence: Secondary | ICD-10-CM | POA: Diagnosis not present

## 2016-05-30 DIAGNOSIS — Z79899 Other long term (current) drug therapy: Secondary | ICD-10-CM | POA: Diagnosis not present

## 2016-05-30 DIAGNOSIS — R41841 Cognitive communication deficit: Secondary | ICD-10-CM | POA: Diagnosis not present

## 2016-05-30 DIAGNOSIS — Z888 Allergy status to other drugs, medicaments and biological substances status: Secondary | ICD-10-CM | POA: Diagnosis not present

## 2016-06-06 DIAGNOSIS — I63512 Cerebral infarction due to unspecified occlusion or stenosis of left middle cerebral artery: Secondary | ICD-10-CM | POA: Diagnosis not present

## 2016-06-06 DIAGNOSIS — R41841 Cognitive communication deficit: Secondary | ICD-10-CM | POA: Diagnosis not present

## 2016-06-13 DIAGNOSIS — I69318 Other symptoms and signs involving cognitive functions following cerebral infarction: Secondary | ICD-10-CM | POA: Diagnosis not present

## 2016-06-16 ENCOUNTER — Encounter: Payer: Self-pay | Admitting: Cardiology

## 2016-06-28 ENCOUNTER — Ambulatory Visit (INDEPENDENT_AMBULATORY_CARE_PROVIDER_SITE_OTHER): Payer: Medicare Other | Admitting: Cardiology

## 2016-06-28 ENCOUNTER — Encounter: Payer: Self-pay | Admitting: Cardiology

## 2016-06-28 VITALS — BP 124/64 | HR 61 | Ht 68.0 in | Wt 258.0 lb

## 2016-06-28 DIAGNOSIS — R079 Chest pain, unspecified: Secondary | ICD-10-CM | POA: Diagnosis not present

## 2016-06-28 DIAGNOSIS — R600 Localized edema: Secondary | ICD-10-CM

## 2016-06-28 DIAGNOSIS — R41841 Cognitive communication deficit: Secondary | ICD-10-CM | POA: Insufficient documentation

## 2016-06-28 DIAGNOSIS — R001 Bradycardia, unspecified: Secondary | ICD-10-CM | POA: Diagnosis not present

## 2016-06-28 DIAGNOSIS — I10 Essential (primary) hypertension: Secondary | ICD-10-CM

## 2016-06-28 DIAGNOSIS — I951 Orthostatic hypotension: Secondary | ICD-10-CM

## 2016-06-28 DIAGNOSIS — G4733 Obstructive sleep apnea (adult) (pediatric): Secondary | ICD-10-CM

## 2016-06-28 DIAGNOSIS — I110 Hypertensive heart disease with heart failure: Secondary | ICD-10-CM

## 2016-06-28 DIAGNOSIS — I5032 Chronic diastolic (congestive) heart failure: Secondary | ICD-10-CM

## 2016-06-28 DIAGNOSIS — I119 Hypertensive heart disease without heart failure: Secondary | ICD-10-CM | POA: Insufficient documentation

## 2016-06-28 DIAGNOSIS — R0989 Other specified symptoms and signs involving the circulatory and respiratory systems: Secondary | ICD-10-CM

## 2016-06-28 NOTE — Progress Notes (Signed)
Cardiology Office Note    Date:  06/28/2016  ID:  [name], DOB 15-Dec-1945, MRN [id number] PCP:  [name], MD  Cardiologist:  [name]   Chief Complaint: f/u blood pressure  History of Present Illness:  Jeff Weaver is a 70 y.o. male with history of obesity, HTN, HLD, chronic diastolic CHF/hypertensive heart disease, prediabetes, cerebral aneurysm s/p clipping at [facility] in 8/13 c/b short-term memory loss, cerebral hemorrhage and seizure disorder s/p VP shunt, sleep apnea, mild AI, sinus bradycardia (baseline HR 50s, 1st degree AV block) who presents for f/u BP.  First seen by Dr. [name] 04/2013 for chest pain with normal nuclear stress test. Admitted 11/2013 for chest pain and elevated troponin (0.74) -> LHC 11/26/13 revealed angiographically normal coronary arteries, mild mid LAD myocardial bridging, moderate to severely elevated LVEDP - with systemic hypertension and preserved LVEF. Echo with EF 60-65%, LVH, pt did not follow up after that. More recently admitted 01/2016 for confusion/fever with possible PNA then diagnosis of acute cholecystitis s/p removal of gallbladder. He's since been followed by PCP for ongoing confusion and fatigue, with treatment of UTI. 2D echo in 01/2016 showed mod LVH, EF 60-65%, grade 2 DD, no RWMA, mild AI. Labs 03/2016 with Na 134, K 4.4, glucose 102, normal TSH, A1C and CBC.  He was seen by Robbie Lis PA-C 03/25/16 reporting fluctuating BP, dizziness upon standing, intermittent chest discomfort and LEE. His neurologist had requested cardiology manage his BP. He had reported BP 110/70s AM and [id number] systolic in PM. He was positive for orthostasis in the office with BP dropping from 120/74 layng to 95/63 standing with associated dizziness. Procardia was felt to be contributing to edema so this was stopped. Compression hose were recommended. Nuclear stress test was normal, EF >65%.  06/28/2016 - this is first time I'm seeing this nice patient, he is accompanied  by his wife and daughter, he feels slightly better, his neurologist is still trying to tune his seizure medications. He feels higher, he is experiencing mild orthostatic hypotension especially when he gets up first in the morning however significantly improved after stopping nifedipine and no presyncope or syncope. He has noticed some mild lower extremity edema after he was advised by his neurologist to increase his salt intake. He will have occasional chest pain that are not related to exertion. He hasn't been walking as he has been dealing with plantar fasciitis.   Past Medical History:  Diagnosis Date  . Aortic insufficiency   . Cerebral aneurysm    a. s/p clipping at [facility] in 8/13 c/b short-term memory loss, cerebral hemorrhage and seizure disorder s/p VP shunt.  . Cerebral hemorrhage ([place])   . Chronic diastolic CHF (congestive heart failure) ([place])   . CVA (cerebral infarction)   . Diverticulosis   . Enteritis (regional)    Dr Olevia Perches  . First degree AV block   . GI bleed   . HTN (hypertension)   . Hyperlipidemia   . Hypertensive heart disease   . IBS (irritable bowel syndrome)   . Orthostatic hypotension   . Pre-diabetes   . Regional enteritis of large intestine ([place]) since 1978  . Rheumatoid arthritis(714.0)    dxed in [employer] in 1980s  . Seizures ([place])   . Sinus bradycardia   . Sleep apnea     Past Surgical History:  Procedure Laterality Date  . CHOLECYSTECTOMY N/A 01/07/2016   Procedure: LAPAROSCOPIC CHOLECYSTECTOMY ;  Surgeon: Coralie Keens, MD;  Location: [facility];  Service: General;  Laterality: N/A;  . cns shunt  02/23/12  . COLONOSCOPY W/ POLYPECTOMY  1978   negative 2009, Dr Olevia Perches. Due 2014  . CRANIOTOMY  02/02/12   Dr Harvel Ricks, [place] of aneurysm  . 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 HERNIA REPAIR    . LEFT HEART CATHETERIZATION WITH CORONARY ANGIOGRAM N/A 11/26/2013   Procedure: LEFT  HEART CATHETERIZATION WITH CORONARY ANGIOGRAM;  Surgeon: [name], MD;  Location: [facility] CATH LAB;  Service: Cardiovascular;  Laterality: N/A;  . NOSE SURGERY    . SHOULDER SURGERY  1997  . TONSILLECTOMY AND ADENOIDECTOMY    . VENTRICULOPERITONEAL SHUNT  02-23-12   INSERTION OF RIGHT FRONTAL VENTRICULOPERITONEAL SHUNT WITH A CODMAN HAKIM PROGRAMMABLE VALVE    Current Medications: Current Outpatient Prescriptions  Medication Sig Dispense Refill  . blood glucose meter kit and supplies KIT Dispense based on patient and insurance preference. Use up to four times daily as directed. (prediabetes hypoglycemia). 1 each 0  . cetirizine (ZYRTEC) 10 MG tablet Take 1 tablet (10 mg total) by mouth daily. 30 tablet 11  . Eslicarbazepine Acetate (APTIOM) 400 MG TABS Take 200 mg by mouth 2 (two) times daily.    . Lacosamide 100 MG TABS Take 100 mg by mouth 2 (two) times daily.    Marland Kitchen levETIRAcetam (KEPPRA) 500 MG tablet Take 500 mg by mouth 2 (two) times daily.     Marland Kitchen losartan (COZAAR) 50 MG tablet Take 1 tablet (50 mg total) by mouth daily. 90 tablet 3  . Melatonin 3 MG TABS Take 3 mg by mouth at bedtime.    . mirabegron ER (MYRBETRIQ) 25 MG TB24 tablet Take 25 mg by mouth every evening.     . Multiple Vitamins-Minerals (MULTIVITAMIN) tablet Take 1 tablet by mouth daily.    . NON FORMULARY CPAP Machine: authorized by [facility] in [place]    . pyridoxine (B-6) 100 MG tablet Take 100 mg by mouth daily.    . Tamsulosin HCl (FLOMAX) 0.4 MG CAPS Take 0.8 mg by mouth at bedtime.     . thiamine 100 MG tablet Take 100 mg by mouth daily.     No current facility-administered medications for this visit.      Allergies:   Crestor [rosuvastatin]; Haloperidol; Simvastatin; Lisinopril; and Pravastatin   Social History   Social History  . Marital status: Married    Spouse name: N/A  . Number of children: 1  . Years of education: N/A   Occupational History  . retired    Social History Main Topics  . Smoking status:  Former Smoker    Quit date: 07/04/2009  . Smokeless tobacco: Never Used  . Alcohol use 0.0 oz/week     Comment: Very Infrequently   . Drug use: No  . Sexual activity: Not Asked   Other Topics Concern  . None   Social History Narrative  . None     Family History:  The patient's family history includes COPD in his father; Coronary artery disease in his father; Dementia (age of onset: 47) in his sister; Diabetes in his brother and sister; Hepatitis in his mother.   ROS:  + arthritis in multiple joints. Please see the history of present illness. All other systems are reviewed and otherwise negative.    PHYSICAL EXAM:   VS:  BP 124/64   Pulse 61   Ht 5' 8"  (1.727 m)   Wt 258  lb (117 kg)   SpO2 96%   BMI 39.23 kg/m   BMI: Body mass index is 39.23 kg/m. GEN: Well nourished, well developed WM, obese, in no acute distress  HEENT: normocephalic, atraumatic Neck: no JVD, carotid bruits, or masses Cardiac: RRR; no murmurs, rubs, or gallops, no edema  Respiratory:  clear to auscultation bilaterally, normal work of breathing GI: soft, nontender, nondistended, + BS MS: no deformity or atrophy  Skin: warm and dry, no rash Neuro:  Alert and Oriented x 3, Strength and sensation are intact, follows commands Psych: euthymic mood, full affect  Wt Readings from Last 3 Encounters:  06/28/16 258 lb (117 kg)  05/10/16 250 lb 1 oz (113.4 kg)  04/11/16 249 lb (112.9 kg)      Studies/Labs Reviewed:   EKG: EKG was not ordered today.  Recent Labs: 01/05/2016: B Natriuretic Peptide 102.5 03/15/2016: TSH 1.29 04/19/2016: ALT 34; BUN 16; Creatinine, Ser 1.13; Hemoglobin 15.2; Magnesium 2.0; Platelets 185.0; Potassium 4.3; Sodium 133   Lipid Panel    Component Value Date/Time   CHOL 203 (H) 07/07/2015 1141   CHOL 159 03/27/2013 0818   TRIG 111.0 07/07/2015 1141   TRIG 127 03/27/2013 0818   HDL 44.00 07/07/2015 1141   HDL 45 03/27/2013 0818   CHOLHDL 5 07/07/2015 1141   VLDL 22.2  07/07/2015 1141   LDLCALC 136 (H) 07/07/2015 1141   LDLCALC 89 03/27/2013 0818   LDLDIRECT 87.6 09/19/2012 1206    Additional studies/ records that were reviewed today include: Summarized above    ASSESSMENT & PLAN:   1. Orthostatic hypotension/labile hypertension - improved after cessation of nifedipine. Would aim for a goal of 469-507 systolic without significant aggressive lowering given his tendency towards orthostasis. He is there right now, continue losartan. He now has lower extremity edema, he is advised to decrease salt intake, in the future if his lower extremity edema doesn't improve I would consider cutting losartan to 25 mg daily and adding hydrochlorothiazide 25 mg daily however since he is doing well right now I don't want to change his blood pressure medications. 2. Chest pain - recent nuc normal, unlikely cardiac in nature. He is advised to use sublingual nitroglycerin. 3. Chronic diastolic CHF - he has mild lower extremity edema, for now he's advised to cut down on salt use compression stocking and elevate his legs, if persistent will add hydrochlorothiazide 25 mg daily. 4. Obstructive sleep apnea - The patient has f/u coming up at the [place] soon to review his CPAP therapy and sleep apnea. He is inconsistent with use which could also contribute to BP lability.  Disposition: F/u with Dr. [name] in 3 months.   Medication Adjustments/Labs and Tests Ordered: Current medicines are reviewed at length with the patient today.  Concerns regarding medicines are outlined above. Medication changes, Labs and Tests ordered today are summarized above and listed in the Patient Instructions accessible in Encounters.   Signed, Ena Dawley, MD 06/28/2016 9:27 AM    [address], [place], [place]  [address] Phone: [phone]; Fax: [phone]

## 2016-06-28 NOTE — Patient Instructions (Signed)
Medication Instructions:  Your physician recommends that you continue on your current medications as directed. Please refer to the Current Medication list given to you today.   Labwork: None Ordered   Testing/Procedures: None Ordered   Follow-Up: Your physician recommends that you schedule a follow-up appointment in: 3 months with Dr. [name]   If you need a refill on your cardiac medications before your next appointment, please call your pharmacy.   Thank you for choosing [facility]! Christen Bame, RN [phone]

## 2016-06-29 DIAGNOSIS — R569 Unspecified convulsions: Secondary | ICD-10-CM | POA: Diagnosis not present

## 2016-06-29 DIAGNOSIS — Z79899 Other long term (current) drug therapy: Secondary | ICD-10-CM | POA: Diagnosis not present

## 2016-06-29 DIAGNOSIS — I10 Essential (primary) hypertension: Secondary | ICD-10-CM | POA: Diagnosis not present

## 2016-06-29 DIAGNOSIS — E785 Hyperlipidemia, unspecified: Secondary | ICD-10-CM | POA: Diagnosis not present

## 2016-06-29 DIAGNOSIS — Z5181 Encounter for therapeutic drug level monitoring: Secondary | ICD-10-CM | POA: Diagnosis not present

## 2016-06-29 DIAGNOSIS — R9401 Abnormal electroencephalogram [EEG]: Secondary | ICD-10-CM | POA: Diagnosis not present

## 2016-06-29 DIAGNOSIS — Z8673 Personal history of transient ischemic attack (TIA), and cerebral infarction without residual deficits: Secondary | ICD-10-CM | POA: Diagnosis not present

## 2016-07-11 DIAGNOSIS — R41841 Cognitive communication deficit: Secondary | ICD-10-CM | POA: Diagnosis not present

## 2016-07-11 DIAGNOSIS — I63512 Cerebral infarction due to unspecified occlusion or stenosis of left middle cerebral artery: Secondary | ICD-10-CM | POA: Diagnosis not present

## 2016-07-11 MED FILL — LOSARTAN POTASSIUM 50 MG TA: 50 | 90 days supply | Qty: 90 | Fill #2

## 2016-07-13 MED FILL — VITAMIN B-6 50 MG TABLET: 50 | 50 days supply | Qty: 100 | Fill #0

## 2016-07-19 DIAGNOSIS — I63512 Cerebral infarction due to unspecified occlusion or stenosis of left middle cerebral artery: Secondary | ICD-10-CM | POA: Diagnosis not present

## 2016-07-19 DIAGNOSIS — R41841 Cognitive communication deficit: Secondary | ICD-10-CM | POA: Diagnosis not present

## 2016-07-24 NOTE — Progress Notes (Signed)
Subjective:    Patient ID: Jeff Weaver, male    DOB: 12/27/1945, 71 y.o.   MRN: [id number]  HPI The patient is here for follow up.  Cough:  He has had it for a long time.  It is a dry cough.  Nothing seems to make it worse or better.  In the morning his nose runs a lot.  He denies GERD.  He denies PND.  He does not think he has allergies.  A few months ago we stopped his zantac.    B12 def:  He has not started B12 yet - they were not sure how much to take.    CAD, h/o MI, Hypertension: He is taking his medication daily. He is somewhat compliant with a low sodium diet.  He denies chest pain, palpitations,and regular headaches. He is not exercising regularly.  He does monitor his blood pressure at home and it has been well controlled.    Prediabetes:  He is not compliant with a low sugar/carbohydrate diet.  He is not exercising regularly.  Seizure disorder, confusion, cerebral aneurysm, h/o stroke:  He is following with neurology.  He did have an EEG that looked good.  He had a neuropsychological evaluation.  His medication is being adjusted for his seizures.  His last seizure was in October.  The neurologist and neuropsychiatrist thinks he should see a therapist and start medication for depression but they told him they would let him know about both.  He is concerned about his memory.  Overall since his seizure medication has been adjusted his symptoms have improved of confusion and lightheadedness.    He has bumps on his forehead and chin and they itch sometimes.  He wonders what he can do about it.    Medications and allergies reviewed with patient and updated if appropriate.  Patient Active Problem List   Diagnosis Date Noted  . Cognitive communication deficit 06/28/2016  . Hypertensive heart disease 06/28/2016  . Vitamin B 12 deficiency 03/16/2016  . Memory difficulties 03/13/2016  . Confusion 03/13/2016  . 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 Orthostatic  hypotension 01/05/2016  . Seizure ([place]) 01/01/2015  . Leukemoid reaction 12/07/2014  . Benign prostatic hyperplasia with urinary obstruction 08/04/2014  . Urinary urgency 08/04/2014  . Gastroesophageal reflux disease 07/14/2014  . Polymyalgia rheumatica ([place]) 05/28/2014  . 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 Acute non-ST-elevation myocardial infarction ([place]) 11/25/2013  . SBE (subacute bacterial endocarditis) prophylaxis candidate 11/24/2013  . Aphasia 07/17/2013  . 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 seizures, not intractable, without status epilepticus ([place]) 07/17/2013  . Subdural hemorrhage ([place]) 07/17/2013  . Pre-diabetes 03/26/2013  . Epigastric discomfort 01/08/2013  . Increased frequency of urination 10/31/2012  . Nocturia 10/31/2012  . History of cerebral aneurysm repair 10/18/2012  . History of stroke without residual deficits 10/18/2012  . Blepharitis of both eyes 09/27/2012  . 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 Seizure disorder ([place]) 07/23/2012  . Entropion 07/10/2012  . Hyperopia with astigmatism 07/10/2012  . Combined senile cataract 04/13/2012  . Presbyopia 04/13/2012  . 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 Cognitive safety issue 02/20/2012  . Nonruptured cerebral aneurysm 11/23/2011  . Obstructive sleep apnea syndrome 08/09/2011  . Hyperlipidemia 02/03/2009  . HTN (hypertension) 02/03/2009  . First degree atrioventricular block 02/03/2009  . OSTEOARTHRITIS 02/03/2009  . REGIONAL ENTERITIS, LARGE INTESTINE 04/17/2008    Current Outpatient Prescriptions on File Prior to Visit  Medication Sig Dispense Refill  . blood glucose meter kit and supplies KIT Dispense based on patient and insurance preference. Use up to four times daily as directed. (prediabetes hypoglycemia). 1 each 0  . cetirizine (ZYRTEC) 10 MG tablet Take 1 tablet (10 mg total) by mouth daily. 30 tablet 11  . Lacosamide 100  MG TABS Take 100 mg by mouth 2 (two) times daily.    Marland Kitchen levETIRAcetam (KEPPRA) 500 MG tablet Take 500 mg by mouth 2 (two) times daily.     Marland Kitchen losartan (COZAAR) 50 MG tablet Take 1 tablet (50 mg total) by mouth daily. 90 tablet 3  . Melatonin 3 MG TABS Take 3 mg by mouth at bedtime.    . mirabegron ER (MYRBETRIQ) 25 MG TB24 tablet Take 25 mg by mouth every evening.     . Multiple Vitamins-Minerals (MULTIVITAMIN) tablet Take 1 tablet by mouth daily.    . NON FORMULARY CPAP Machine: authorized by [facility] in [place]    . pyridoxine (B-6) 100 MG tablet Take 100 mg by mouth daily.    . Tamsulosin HCl (FLOMAX) 0.4 MG CAPS Take 0.8 mg by mouth at bedtime.     . thiamine 100 MG tablet Take 100 mg by mouth daily.     No current facility-administered medications on file prior to visit.     Past Medical History:  Diagnosis Date  . Aortic insufficiency   . Cerebral aneurysm    a. s/p clipping at [facility] in 8/13 c/b short-term memory loss, cerebral hemorrhage and seizure disorder s/p VP shunt.  . Cerebral hemorrhage ([place])   . Chronic diastolic CHF (congestive heart failure) ([place])   . CVA (cerebral infarction)   . Diverticulosis   . Enteritis (regional)    Dr Olevia Perches  . First degree AV block   . GI bleed   . HTN (hypertension)   . Hyperlipidemia   . Hypertensive heart disease   . IBS (irritable bowel syndrome)   . Orthostatic hypotension   . Pre-diabetes   . 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 Rheumatoid arthritis(714.0)    dxed in [employer] in 1980s  . Seizures ([place])   . Sinus bradycardia   . Sleep apnea     Past Surgical History:  Procedure Laterality Date  . CHOLECYSTECTOMY N/A 01/07/2016   Procedure: LAPAROSCOPIC CHOLECYSTECTOMY ;  Surgeon: Coralie Keens, MD;  Location: [place];  Service: General;  Laterality: N/A;  . cns shunt  02/23/12  . COLONOSCOPY W/ POLYPECTOMY  1978   negative 2009, Dr Olevia Perches. Due 2014  . CRANIOTOMY  02/02/12   Dr Harvel Ricks, [place] of aneurysm  .  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 HERNIA REPAIR    . LEFT HEART CATHETERIZATION WITH CORONARY ANGIOGRAM N/A 11/26/2013   Procedure: LEFT HEART CATHETERIZATION WITH CORONARY ANGIOGRAM;  Surgeon: [name], MD;  Location: [facility] C[facility] LAB;  Service: Cardiovascular;  Laterality: N/A;  . NOSE SURGERY    . SHOULDER SURGERY  1997  . TONSILLECTOMY AND ADENOIDECTOMY    . VENTRICULOPERITONEAL SHUNT  02-23-12   INSERTION OF RIGHT FRONTAL VENTRICULOPERITONEAL SHUNT WITH A CODMAN HAKIM PROGRAMMABLE VALVE    Social History   Social History  . Marital status: Married    Spouse name: N/A  . Number of children: 1  . Years of education: N/A   Occupational History  . retired    Social History Main Topics  .  Smoking status: Former Smoker    Quit date: 07/04/2009  . Smokeless tobacco: Never Used  . Alcohol use 0.0 oz/week     Comment: Very Infrequently   . Drug use: No  . Sexual activity: Not on file   Other Topics Concern  . Not on file   Social History Narrative  . No narrative on file    Family History  Problem Relation Age of Onset  . COPD Father   . Coronary artery disease Father     MI in 23s  . Hepatitis Mother   . Diabetes Sister   . Dementia Sister 93  . Diabetes Brother   . Colon cancer Neg Hx     Review of Systems  Constitutional: Negative for chills and fever.  Respiratory: Positive for shortness of breath (with exertion). Negative for cough and wheezing.   Cardiovascular: Positive for chest pain (chest wall with coughing) and leg swelling (more than usual). Negative for palpitations.  Gastrointestinal: Negative for abdominal pain.  Neurological: Positive for dizziness (occasional), light-headedness (when he gets up in the morning, or sitting for long periods) and headaches (occasional).       Objective:   Vitals:   07/25/16 0855  BP: 120/82  Pulse: (!) 57  Resp: 16  Temp: 97.9 F  (36.6 C)   Wt Readings from Last 3 Encounters:  07/25/16 260 lb (117.9 kg)  06/28/16 258 lb (117 kg)  05/10/16 250 lb 1 oz (113.4 kg)   Body mass index is 39.53 kg/m.   Physical Exam    Constitutional: Appears well-developed and well-nourished. No distress.  HENT:  Head: Normocephalic and atraumatic.  Neck: Neck supple. No tracheal deviation present. No thyromegaly present.  No cervical lymphadenopathy Cardiovascular: Normal rate, regular rhythm and normal heart sounds.   1/6 systolic murmur heard. No carotid bruit .  No edema Pulmonary/Chest: Effort normal and breath sounds normal. No respiratory distress. No has no wheezes. No rales.  Skin: Skin is warm and dry. Not diaphoretic. several papules on forehead and chin, no blisters Psychiatric: Normal mood and affect. Behavior is normal.      Assessment & Plan:   Pneumovax today  See Problem List for Assessment and Plan of chronic medical problems.    FU in 6 months

## 2016-07-25 ENCOUNTER — Other Ambulatory Visit (INDEPENDENT_AMBULATORY_CARE_PROVIDER_SITE_OTHER): Payer: Medicare Other

## 2016-07-25 ENCOUNTER — Encounter: Payer: Self-pay | Admitting: Internal Medicine

## 2016-07-25 ENCOUNTER — Ambulatory Visit (INDEPENDENT_AMBULATORY_CARE_PROVIDER_SITE_OTHER): Payer: Medicare Other | Admitting: Internal Medicine

## 2016-07-25 VITALS — BP 120/82 | HR 57 | Temp 97.9°F | Resp 16 | Ht 68.0 in | Wt 260.0 lb

## 2016-07-25 DIAGNOSIS — I10 Essential (primary) hypertension: Secondary | ICD-10-CM | POA: Diagnosis not present

## 2016-07-25 DIAGNOSIS — E7800 Pure hypercholesterolemia, unspecified: Secondary | ICD-10-CM

## 2016-07-25 DIAGNOSIS — Z23 Encounter for immunization: Secondary | ICD-10-CM

## 2016-07-25 DIAGNOSIS — L219 Seborrheic dermatitis, unspecified: Secondary | ICD-10-CM | POA: Diagnosis not present

## 2016-07-25 DIAGNOSIS — R7303 Prediabetes: Secondary | ICD-10-CM

## 2016-07-25 DIAGNOSIS — G40909 Epilepsy, unspecified, not intractable, without status epilepticus: Secondary | ICD-10-CM | POA: Diagnosis not present

## 2016-07-25 DIAGNOSIS — K219 Gastro-esophageal reflux disease without esophagitis: Secondary | ICD-10-CM

## 2016-07-25 DIAGNOSIS — Z1159 Encounter for screening for other viral diseases: Secondary | ICD-10-CM

## 2016-07-25 DIAGNOSIS — E538 Deficiency of other specified B group vitamins: Secondary | ICD-10-CM | POA: Diagnosis not present

## 2016-07-25 LAB — COMPREHENSIVE METABOLIC PANEL
ALT: 46 U/L (ref 0–53)
AST: 25 U/L (ref 0–37)
Albumin: 4.2 g/dL (ref 3.5–5.2)
Alkaline Phosphatase: 48 U/L (ref 39–117)
BUN: 15 mg/dL (ref 6–23)
CO2: 34 mEq/L — ABNORMAL HIGH (ref 19–32)
Calcium: 10 mg/dL (ref 8.4–10.5)
Chloride: 99 mEq/L (ref 96–112)
Creatinine, Ser: 0.99 mg/dL (ref 0.40–1.50)
GFR: 78.94 mL/min (ref >60.00)
Glucose, Bld: 111 mg/dL — ABNORMAL HIGH (ref 70–99)
Potassium: 4.7 mEq/L (ref 3.5–5.1)
Sodium: 138 mEq/L (ref 135–145)
Total Bilirubin: 0.5 mg/dL (ref 0.2–1.2)
Total Protein: 7.1 g/dL (ref 6.0–8.3)

## 2016-07-25 LAB — LIPID PANEL
Cholesterol: 203 mg/dL — ABNORMAL HIGH (ref 0–200)
HDL: 49.2 mg/dL (ref >39.00)
LDL Cholesterol: 120 mg/dL — ABNORMAL HIGH (ref 0–99)
NonHDL: 154.06
Total CHOL/HDL Ratio: 4
Triglycerides: 169 mg/dL — ABNORMAL HIGH (ref 0.0–149.0)
VLDL: 33.8 mg/dL (ref 0.0–40.0)

## 2016-07-25 LAB — HEPATITIS C ANTIBODY: HCV Ab: NEGATIVE

## 2016-07-25 LAB — HEMOGLOBIN A1C: Hgb A1c MFr Bld: 5.8 % (ref 4.6–6.5)

## 2016-07-25 MED ORDER — TACROLIMUS 0.03 % EX OINT: TOPICAL_OINTMENT | Freq: Two times a day (BID) | CUTANEOUS | 0 refills | Status: DC

## 2016-07-25 MED ORDER — RANITIDINE HCL 150 MG PO CAPS: 150.0000 mg | ORAL_CAPSULE | Freq: Every evening | ORAL | 1 refills | Status: DC

## 2016-07-25 MED FILL — TACROLIMUS 0.03% OINTMENT: 0.03 | 30 days supply | Qty: 100 | Fill #0

## 2016-07-25 NOTE — Assessment & Plan Note (Signed)
Face Will try tacrolimus If no improvement will refer to derm

## 2016-07-25 NOTE — Patient Instructions (Addendum)
Start vitamin B12 1000 mcg daily.     Test(s) ordered today. Your results will be released to [facility] (or called to you) after review, usually within 72hours after test completion. If any changes need to be made, you will be notified at that same time.  All other Health Maintenance issues reviewed.   All recommended immunizations and age-appropriate screenings are up-to-date or discussed.  Pneumonia immunization administered today.   Medications reviewed and updated.  Changes include trying tacrolimus for your face rash.   Also we will restart zantac to see if that helps the cough.  Start taking B12.   Your prescription(s) have been submitted to your pharmacy. Please take as directed and contact our office if you believe you are having problem(s) with the medication(s).   Please followup in 6 months

## 2016-07-25 NOTE — Assessment & Plan Note (Signed)
Check a1c Stressed low sugar/carb diet Increase exercise Work on weight loss

## 2016-07-25 NOTE — Assessment & Plan Note (Signed)
Statin intolerant -tried several  Check lipid panel

## 2016-07-25 NOTE — Assessment & Plan Note (Signed)
Start B12 1000 mcg orally daily Recheck level in 6 months

## 2016-07-25 NOTE — Assessment & Plan Note (Signed)
BP Readings from Last 3 Encounters:  07/25/16 120/82  06/28/16 124/64  05/10/16 120/72   BP well controlled Current regimen effective and well tolerated Continue current medications at current doses cmp

## 2016-07-25 NOTE — Progress Notes (Signed)
Pre visit review using our clinic review tool, if applicable. No additional management support is needed unless otherwise documented below in the visit note. 

## 2016-07-25 NOTE — Assessment & Plan Note (Signed)
Restart zantac - dry cough may be related to silent GERD

## 2016-07-29 ENCOUNTER — Encounter: Payer: Self-pay | Admitting: Internal Medicine

## 2016-08-10 DIAGNOSIS — R972 Elevated prostate specific antigen [PSA]: Secondary | ICD-10-CM | POA: Diagnosis not present

## 2016-08-10 DIAGNOSIS — R351 Nocturia: Secondary | ICD-10-CM | POA: Diagnosis not present

## 2016-08-10 DIAGNOSIS — N401 Enlarged prostate with lower urinary tract symptoms: Secondary | ICD-10-CM | POA: Diagnosis not present

## 2016-08-10 DIAGNOSIS — R3915 Urgency of urination: Secondary | ICD-10-CM | POA: Diagnosis not present

## 2016-08-15 DIAGNOSIS — Z961 Presence of intraocular lens: Secondary | ICD-10-CM | POA: Diagnosis not present

## 2016-08-24 MED FILL — AMOXICILLIN 500 MG CAPSULE: 500 | 2 days supply | Qty: 8 | Fill #0

## 2016-09-25 ENCOUNTER — Encounter: Payer: Self-pay | Admitting: Internal Medicine

## 2016-09-25 NOTE — Progress Notes (Signed)
Subjective:    Patient ID: Jeff Weaver, male    DOB: 24-Sep-1945, 71 y.o.   MRN: [id number]  HPI The patient is here for follow up.  His family knows he has an infection and that can trigger a seizure. His poor sleep is also a contributor.    His symptoms started 3-4 days ago.  He has a sore throat, nasal congestion with thick yellow mucus, sinus pain, deep cough.  His voice is hoarse.  He has some wheeze at night and SOB.   He has headaches.    Prediabetes:  He is compliant with a low sugar/carbohydrate diet.  He is exercising regularly.  Seizure disorder, confusion, cerebral aneurysm, h/o stroke:  Following with neurology.  He did have a seizure this morning.  They feel this is related to his infection.   Depression, insomnia:  Neurology referred him to a neuropsychiatrist.  He has not heard about the referral.  He does not sleep well and has PTSD.     Medications and allergies reviewed with patient and updated if appropriate.  Patient Active Problem List   Diagnosis Date Noted  . Seborrheic dermatitis 07/25/2016  . Cognitive communication deficit 06/28/2016  . Hypertensive heart disease 06/28/2016  . Vitamin B 12 deficiency 03/16/2016  . Memory difficulties 03/13/2016  . Confusion 03/13/2016  . 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 Orthostatic hypotension 01/05/2016  . Leukemoid reaction 12/07/2014  . Benign prostatic hyperplasia with urinary obstruction 08/04/2014  . Urinary urgency 08/04/2014  . Gastroesophageal reflux disease 07/14/2014  . Polymyalgia rheumatica ([place]) 05/28/2014  . 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 Acute non-ST-elevation myocardial infarction ([place]) 11/25/2013  . SBE (subacute bacterial endocarditis) prophylaxis candidate 11/24/2013  . Aphasia 07/17/2013  . 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 seizures, not intractable, without status epilepticus ([place]) 07/17/2013  . Subdural hemorrhage  ([place]) 07/17/2013  . Pre-diabetes 03/26/2013  . Increased frequency of urination 10/31/2012  . Nocturia 10/31/2012  . History of cerebral aneurysm repair 10/18/2012  . History of stroke without residual deficits 10/18/2012  . Blepharitis of both eyes 09/27/2012  . 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 Seizure disorder ([place]) 07/23/2012  . Entropion 07/10/2012  . Hyperopia with astigmatism 07/10/2012  . Combined senile cataract 04/13/2012  . Presbyopia 04/13/2012  . 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 Cognitive safety issue 02/20/2012  . Nonruptured cerebral aneurysm 11/23/2011  . Obstructive sleep apnea syndrome 08/09/2011  . Hyperlipidemia 02/03/2009  . HTN (hypertension) 02/03/2009  . First degree atrioventricular block 02/03/2009  . OSTEOARTHRITIS 02/03/2009  . REGIONAL ENTERITIS, LARGE INTESTINE 04/17/2008    Current Outpatient Prescriptions on File Prior to Visit  Medication Sig Dispense Refill  . blood glucose meter kit and supplies KIT Dispense based on patient and insurance preference. Use up to four times daily as directed. (prediabetes hypoglycemia). 1 each 0  . cetirizine (ZYRTEC) 10 MG tablet Take 1 tablet (10 mg total) by mouth daily. 30 tablet 11  . Eslicarbazepine Acetate 200 MG TABS Take 200 mg by mouth 2 (two) times daily.    . Lacosamide 100 MG TABS Take 100 mg by mouth 2 (two) times daily.    Marland Kitchen levETIRAcetam (KEPPRA) 500 MG tablet Take 500 mg by mouth 2 (two) times daily.     Marland Kitchen losartan (COZAAR) 50 MG tablet Take 1 tablet (50 mg total) by mouth daily. 90 tablet 3  . Melatonin 3 MG TABS Take 3 mg by  mouth at bedtime.    . mirabegron ER (MYRBETRIQ) 25 MG TB24 tablet Take 25 mg by mouth every evening.     . Multiple Vitamins-Minerals (MULTIVITAMIN) tablet Take 1 tablet by mouth daily.    . NON FORMULARY CPAP Machine: authorized by [facility] in [place]    . pyridOXINE (VITAMIN B-6) 50 MG tablet   11  . ranitidine (ZANTAC) 150 MG capsule Take 1  capsule (150 mg total) by mouth every evening. 90 capsule 1  . tacrolimus (PROTOPIC) 0.03 % ointment Apply topically 2 (two) times daily. To face 100 g 0  . Tamsulosin HCl (FLOMAX) 0.4 MG CAPS Take 0.8 mg by mouth at bedtime.     . thiamine 100 MG tablet Take 100 mg by mouth daily.     No current facility-administered medications on file prior to visit.     Past Medical History:  Diagnosis Date  . Aortic insufficiency   . Cerebral aneurysm    a. s/p clipping at [facility] in 8/13 c/b short-term memory loss, cerebral hemorrhage and seizure disorder s/p VP shunt.  . Cerebral hemorrhage ([place])   . Chronic diastolic CHF (congestive heart failure) ([place])   . CVA (cerebral infarction)   . Diverticulosis   . Enteritis (regional)    Dr Olevia Perches  . First degree AV block   . GI bleed   . HTN (hypertension)   . Hyperlipidemia   . Hypertensive heart disease   . IBS (irritable bowel syndrome)   . Orthostatic hypotension   . Pre-diabetes   . Regional enteritis of large intestine ([place]) since 1978  . Rheumatoid arthritis(714.0)    dxed in [employer] in 1980s  . Seizures ([place])   . Sinus bradycardia   . Sleep apnea     Past Surgical History:  Procedure Laterality Date  . CHOLECYSTECTOMY N/A 01/07/2016   Procedure: LAPAROSCOPIC CHOLECYSTECTOMY ;  Surgeon: Coralie Keens, MD;  Location: [place];  Service: General;  Laterality: N/A;  . cns shunt  02/23/12  . COLONOSCOPY W/ POLYPECTOMY  1978   negative 2009, Dr Olevia Perches. Due 2014  . CRANIOTOMY  02/02/12   Dr Harvel Ricks, [place] of aneurysm  . 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 HERNIA REPAIR    . LEFT HEART CATHETERIZATION WITH CORONARY ANGIOGRAM N/A 11/26/2013   Procedure: LEFT HEART CATHETERIZATION WITH CORONARY ANGIOGRAM;  Surgeon: [name], MD;  Location: [facility] CATH LAB;  Service: Cardiovascular;  Laterality: N/A;  . NOSE SURGERY    . SHOULDER SURGERY  1997  . TONSILLECTOMY  AND ADENOIDECTOMY    . VENTRICULOPERITONEAL SHUNT  02-23-12   INSERTION OF RIGHT FRONTAL VENTRICULOPERITONEAL SHUNT WITH A CODMAN HAKIM PROGRAMMABLE VALVE    Social History   Social History  . Marital status: Married    Spouse name: N/A  . Number of children: 1  . Years of education: N/A   Occupational History  . retired    Social History Main Topics  . Smoking status: Former Smoker    Quit date: 07/04/2009  . Smokeless tobacco: Never Used  . Alcohol use 0.0 oz/week     Comment: Very Infrequently   . Drug use: No  . Sexual activity: Not Asked   Other Topics Concern  . None   Social History Narrative  . None    Family History  Problem Relation Age of Onset  . COPD Father   . Coronary artery disease Father  MI in 37s  . Hepatitis Mother   . Diabetes Sister   . Dementia Sister 50  . Diabetes Brother   . Colon cancer Neg Hx     Review of Systems  Constitutional: Negative for appetite change and fever (has felt warm).  HENT: Positive for congestion, sinus pain and sore throat. Negative for ear pain.   Respiratory: Positive for cough, shortness of breath and wheezing.   Gastrointestinal: Positive for abdominal pain. Negative for diarrhea, nausea and vomiting.  Genitourinary: Positive for difficulty urinating. Negative for dysuria and hematuria.  Musculoskeletal: Positive for myalgias.  Neurological: Positive for dizziness, light-headedness and headaches.       Objective:   Vitals:   09/26/16 1607  BP: 138/82  Pulse: 62  Resp: 16  Temp: 98 F (36.7 C)   Wt Readings from Last 3 Encounters:  09/26/16 260 lb (117.9 kg)  07/25/16 260 lb (117.9 kg)  06/28/16 258 lb (117 kg)   Body mass index is 39.53 kg/m.   Physical Exam    GENERAL APPEARANCE: Appears stated age, well appearing, NAD EYES: conjunctiva clear, no icterus, eyelids mildly erythematous HEENT: bilateral tympanic membranes and ear canals normal, oropharynx with mild erythema, no thyromegaly,  trachea midline, no cervical or supraclavicular lymphadenopathy LUNGS:unlabored breathing, good air entry bilaterally, no wheeze, crackles HEART: Normal S1,S2 without murmurs EXTREMITIES: Without clubbing, cyanosis, or edema      Assessment & Plan:    See Problem List for Assessment and Plan of chronic medical problems.

## 2016-09-25 NOTE — Patient Instructions (Addendum)
An antibiotic was sent to your pharmacy. If you are not feeling better please let me know.  Follow up in 4 months

## 2016-09-26 ENCOUNTER — Ambulatory Visit (INDEPENDENT_AMBULATORY_CARE_PROVIDER_SITE_OTHER): Payer: Medicare Other | Admitting: Internal Medicine

## 2016-09-26 ENCOUNTER — Ambulatory Visit: Payer: Medicare Other | Admitting: Cardiology

## 2016-09-26 ENCOUNTER — Encounter: Payer: Self-pay | Admitting: Internal Medicine

## 2016-09-26 VITALS — BP 138/82 | HR 62 | Temp 98.0°F | Resp 16 | Wt 260.0 lb

## 2016-09-26 DIAGNOSIS — R7303 Prediabetes: Secondary | ICD-10-CM | POA: Diagnosis not present

## 2016-09-26 DIAGNOSIS — J209 Acute bronchitis, unspecified: Secondary | ICD-10-CM

## 2016-09-26 DIAGNOSIS — G40909 Epilepsy, unspecified, not intractable, without status epilepticus: Secondary | ICD-10-CM

## 2016-09-26 DIAGNOSIS — I10 Essential (primary) hypertension: Secondary | ICD-10-CM

## 2016-09-26 MED ORDER — CEFDINIR 300 MG PO CAPS: 300.0000 mg | ORAL_CAPSULE | Freq: Two times a day (BID) | ORAL | 0 refills | Status: DC

## 2016-09-26 MED ORDER — HYDROCODONE-HOMATROPINE 5-1.5 MG/5ML PO SYRP: 5.0000 mL | ORAL_SOLUTION | Freq: Three times a day (TID) | ORAL | 0 refills | Status: DC | PRN

## 2016-09-26 MED ORDER — BENZONATATE 200 MG PO CAPS: 200.0000 mg | ORAL_CAPSULE | Freq: Three times a day (TID) | ORAL | 0 refills | Status: DC | PRN

## 2016-09-26 MED FILL — BENZONATATE 200 MG CAP: 200 | 10 days supply | Qty: 30 | Fill #0

## 2016-09-26 MED FILL — HYDROCODONE-HOMATROPINE SYR: 5-1.5 | 8 days supply | Qty: 120 | Fill #0

## 2016-09-26 MED FILL — CEFDINIR 300 MG CAPSULE: 300 | 10 days supply | Qty: 20 | Fill #0

## 2016-09-26 NOTE — Assessment & Plan Note (Signed)
Had a seizure this morning - small one - they informed neurology Management per neurology Current infection may have contributed, poor sleep also contributes Will follow up with neurology

## 2016-09-26 NOTE — Assessment & Plan Note (Signed)
Borderline high  Continue current meds and current doses

## 2016-09-26 NOTE — Assessment & Plan Note (Signed)
Lab Results  Component Value Date   HGBA1C 5.8 07/25/2016   Sugars have been controlled, compliant with prediabetic diet Not exercising

## 2016-09-26 NOTE — Assessment & Plan Note (Signed)
Symptoms consistent with bacterial infection Start omnicef bid x 10 days Hycodan cough syrup Saline nasal spray, mucinex Call if no improvement

## 2016-09-26 NOTE — Progress Notes (Signed)
Pre visit review using our clinic review tool, if applicable. No additional management support is needed unless otherwise documented below in the visit note. 

## 2016-10-04 ENCOUNTER — Other Ambulatory Visit: Payer: Self-pay | Admitting: Internal Medicine

## 2016-10-04 DIAGNOSIS — F3289 Other specified depressive episodes: Secondary | ICD-10-CM

## 2016-10-04 DIAGNOSIS — F4310 Post-traumatic stress disorder, unspecified: Secondary | ICD-10-CM

## 2016-10-05 ENCOUNTER — Ambulatory Visit (INDEPENDENT_AMBULATORY_CARE_PROVIDER_SITE_OTHER): Payer: Medicare Other | Admitting: Internal Medicine

## 2016-10-05 ENCOUNTER — Encounter: Payer: Self-pay | Admitting: Internal Medicine

## 2016-10-05 ENCOUNTER — Ambulatory Visit (INDEPENDENT_AMBULATORY_CARE_PROVIDER_SITE_OTHER)
Admission: RE | Admit: 2016-10-05 | Discharge: 2016-10-05 | Disposition: A | Discharge status: Home / Self Care | Payer: Medicare Other | Source: Ambulatory Visit | Attending: Internal Medicine | Admitting: Internal Medicine

## 2016-10-05 VITALS — BP 116/74 | HR 62 | Temp 97.7°F | Resp 16 | Wt 254.0 lb

## 2016-10-05 DIAGNOSIS — R05 Cough: Secondary | ICD-10-CM | POA: Diagnosis not present

## 2016-10-05 DIAGNOSIS — R059 Cough, unspecified: Secondary | ICD-10-CM

## 2016-10-05 DIAGNOSIS — J209 Acute bronchitis, unspecified: Secondary | ICD-10-CM

## 2016-10-05 DIAGNOSIS — R0602 Shortness of breath: Secondary | ICD-10-CM

## 2016-10-05 MED ORDER — DOXYCYCLINE HYCLATE 100 MG PO TABS: 100.0000 mg | ORAL_TABLET | Freq: Two times a day (BID) | ORAL | 0 refills | Status: DC

## 2016-10-05 MED ORDER — HYDROCOD POLST-CPM POLST ER 10-8 MG/5ML PO SUER: 5.0000 mL | Freq: Two times a day (BID) | ORAL | 0 refills | Status: DC | PRN

## 2016-10-05 MED FILL — DOXYCYCLINE HYCLATE 100 MG: 100 | 10 days supply | Qty: 20 | Fill #0

## 2016-10-05 MED FILL — HYDROCODONE-CHLORPHENIRAM S: 10-8 | 15 days supply | Qty: 150 | Fill #0

## 2016-10-05 NOTE — Progress Notes (Signed)
Pre visit review using our clinic review tool, if applicable. No additional management support is needed unless otherwise documented below in the visit note. 

## 2016-10-05 NOTE — Assessment & Plan Note (Signed)
Related to bronchitis No change with omnicef - will d/c Bronchitis, possible PNA - cxr to rule out PNA Start doxycycline, tussionex cough syrup, benzoate prn Call if no improvement

## 2016-10-05 NOTE — Patient Instructions (Addendum)
A chest xray was ordered.  We will call you with the results   Medications reviewed and updated.  Changes include starting doxycycline for your cold symptoms.  Your prescription(s) have been submitted to your pharmacy. Please take as directed and contact our office if you believe you are having problem(s) with the medication(s).

## 2016-10-05 NOTE — Progress Notes (Signed)
Subjective:    Patient ID: Jeff Weaver, male    DOB: December 15, 1945, 71 y.o.   MRN: [id number]  HPI  He is not feeling any better.  He has two pills left of the antibiotic.   He is still experiencing a deep cough and bringing up discolored phlegm.  He has some SOB, occ wheeze, nasal congestion, headaches, fatigue, sinus pressure and hoarseness.   The hycodan cough syrup has helped, but he is still coughing.  He did not take any of the [employer]  He is drinking fluids.     Medications and allergies reviewed with patient and updated if appropriate.  Patient Active Problem List   Diagnosis Date Noted  . Acute bronchitis 09/26/2016  . Seborrheic dermatitis 07/25/2016  . Cognitive communication deficit 06/28/2016  . Hypertensive heart disease 06/28/2016  . Vitamin B 12 deficiency 03/16/2016  . Memory difficulties 03/13/2016  . Confusion 03/13/2016  . 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 Orthostatic hypotension 01/05/2016  . Leukemoid reaction 12/07/2014  . Benign prostatic hyperplasia with urinary obstruction 08/04/2014  . Urinary urgency 08/04/2014  . Gastroesophageal reflux disease 07/14/2014  . Polymyalgia rheumatica ([place]) 05/28/2014  . 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 Acute non-ST-elevation myocardial infarction ([place]) 11/25/2013  . SBE (subacute bacterial endocarditis) prophylaxis candidate 11/24/2013  . Aphasia 07/17/2013  . 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 seizures, not intractable, without status epilepticus ([place]) 07/17/2013  . Subdural hemorrhage ([place]) 07/17/2013  . Pre-diabetes 03/26/2013  . Increased frequency of urination 10/31/2012  . Nocturia 10/31/2012  . History of cerebral aneurysm repair 10/18/2012  . History of stroke without residual deficits 10/18/2012  . Blepharitis of both eyes 09/27/2012  . 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 Seizure disorder ([place]) 07/23/2012  . Entropion 07/10/2012  .  Hyperopia with astigmatism 07/10/2012  . Combined senile cataract 04/13/2012  . Presbyopia 04/13/2012  . 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 Cognitive safety issue 02/20/2012  . Nonruptured cerebral aneurysm 11/23/2011  . Obstructive sleep apnea syndrome 08/09/2011  . Hyperlipidemia 02/03/2009  . HTN (hypertension) 02/03/2009  . First degree atrioventricular block 02/03/2009  . OSTEOARTHRITIS 02/03/2009  . REGIONAL ENTERITIS, LARGE INTESTINE 04/17/2008    Current Outpatient Prescriptions on File Prior to Visit  Medication Sig Dispense Refill  . benzonatate (TESSALON) 200 MG capsule Take 1 capsule (200 mg total) by mouth 3 (three) times daily as needed for cough. 30 capsule 0  . blood glucose meter kit and supplies KIT Dispense based on patient and insurance preference. Use up to four times daily as directed. (prediabetes hypoglycemia). 1 each 0  . cefdinir (OMNICEF) 300 MG capsule Take 1 capsule (300 mg total) by mouth 2 (two) times daily. 20 capsule 0  . cetirizine (ZYRTEC) 10 MG tablet Take 1 tablet (10 mg total) by mouth daily. 30 tablet 11  . Eslicarbazepine Acetate 200 MG TABS Take 200 mg by mouth 2 (two) times daily.    Marland Kitchen HYDROcodone-homatropine (HYCODAN) 5-1.5 MG/5ML syrup Take 5 mLs by mouth every 8 (eight) hours as needed for cough. 120 mL 0  . Lacosamide 100 MG TABS Take 100 mg by mouth 2 (two) times daily.    Marland Kitchen levETIRAcetam (KEPPRA) 500 MG tablet Take 500 mg by mouth 2 (two) times daily.     Marland Kitchen losartan (COZAAR) 50 MG tablet Take 1 tablet (50 mg total) by mouth daily. 90 tablet 3  . Melatonin 3 MG TABS Take 3  mg by mouth at bedtime.    . mirabegron ER (MYRBETRIQ) 25 MG TB24 tablet Take 25 mg by mouth every evening.     . Multiple Vitamins-Minerals (MULTIVITAMIN) tablet Take 1 tablet by mouth daily.    . NON FORMULARY CPAP Machine: authorized by [facility] in [place]    . pyridOXINE (VITAMIN B-6) 50 MG tablet   11  . ranitidine (ZANTAC) 150 MG  capsule Take 1 capsule (150 mg total) by mouth every evening. 90 capsule 1  . tacrolimus (PROTOPIC) 0.03 % ointment Apply topically 2 (two) times daily. To face 100 g 0  . Tamsulosin HCl (FLOMAX) 0.4 MG CAPS Take 0.8 mg by mouth at bedtime.     . thiamine 100 MG tablet Take 100 mg by mouth daily.     No current facility-administered medications on file prior to visit.     Past Medical History:  Diagnosis Date  . Aortic insufficiency   . Cerebral aneurysm    a. s/p clipping at [facility] in 8/13 c/b short-term memory loss, cerebral hemorrhage and seizure disorder s/p VP shunt.  . Cerebral hemorrhage ([place])   . Chronic diastolic CHF (congestive heart failure) ([place])   . CVA (cerebral infarction)   . Diverticulosis   . Enteritis (regional)    Dr Olevia Perches  . First degree AV block   . GI bleed   . HTN (hypertension)   . Hyperlipidemia   . Hypertensive heart disease   . IBS (irritable bowel syndrome)   . Orthostatic hypotension   . Pre-diabetes   . 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 Rheumatoid arthritis(714.0)    dxed in [employer] in 1980s  . Seizures ([place])   . Sinus bradycardia   . Sleep apnea     Past Surgical History:  Procedure Laterality Date  . CHOLECYSTECTOMY N/A 01/07/2016   Procedure: LAPAROSCOPIC CHOLECYSTECTOMY ;  Surgeon: Coralie Keens, MD;  Location: [place];  Service: General;  Laterality: N/A;  . cns shunt  02/23/12  . COLONOSCOPY W/ POLYPECTOMY  1978   negative 2009, Dr Olevia Perches. Due 2014  . CRANIOTOMY  02/02/12   Dr Harvel Ricks, [place] of aneurysm  . 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 HERNIA REPAIR    . LEFT HEART CATHETERIZATION WITH CORONARY ANGIOGRAM N/A 11/26/2013   Procedure: LEFT HEART CATHETERIZATION WITH CORONARY ANGIOGRAM;  Surgeon: [name], MD;  Location[facility] C[facility] LAB;  Service: Cardiovascular;  Laterality: N/A;  . NOSE SURGERY    . SHOULDER SURGERY  1997  .  TONSILLECTOMY AND ADENOIDECTOMY    . VENTRICULOPERITONEAL SHUNT  02-23-12   INSERTION OF RIGHT FRONTAL VENTRICULOPERITONEAL SHUNT WITH A CODMAN HAKIM PROGRAMMABLE VALVE    Social History   Social History  . Marital status: Married    Spouse name: N/A  . Number of children: 1  . Years of education: N/A   Occupational History  . retired    Social History Main Topics  . Smoking status: Former Smoker    Quit date: 07/04/2009  . Smokeless tobacco: Never Used  . Alcohol use 0.0 oz/week     Comment: Very Infrequently   . Drug use: No  . Sexual activity: Not on file   Other Topics Concern  . Not on file   Social History Narrative  . No narrative on file    Family History  Problem Relation Age of Onset  . COPD Father   .  Coronary artery disease Father     MI in 51s  . Hepatitis Mother   . Diabetes Sister   . Dementia Sister 19  . Diabetes Brother   . Colon cancer Neg Hx     Review of Systems  Constitutional: Positive for appetite change and fatigue. Negative for fever.  HENT: Positive for congestion, sinus pressure and voice change. Negative for sinus pain.   Respiratory: Positive for cough (discolored), chest tightness, shortness of breath and wheezing.   Neurological: Positive for headaches.       Objective:   Vitals:   10/05/16 1554  BP: 116/74  Pulse: 62  Resp: 16  Temp: 97.7 F (36.5 C)   Filed Weights   10/05/16 1554  Weight: 254 lb (115.2 kg)   Body mass index is 38.62 kg/m.  Wt Readings from Last 3 Encounters:  10/05/16 254 lb (115.2 kg)  09/26/16 260 lb (117.9 kg)  07/25/16 260 lb (117.9 kg)     Physical Exam GENERAL APPEARANCE: Appears stated age, mild ill appearing, NAD EYES: conjunctiva with mild erythema, no icterus HEENT: bilateral tympanic membranes and ear canals normal, oropharynx with mild erythema, no thyromegaly, trachea midline, no cervical or supraclavicular lymphadenopathy LUNGS: Clear to auscultation without wheeze or  crackles, unlabored breathing, good air entry bilaterally HEART: Normal S1,S2 without murmurs EXTREMITIES: Without clubbing, cyanosis, or edema        Assessment & Plan:   See Problem List for Assessment and Plan of chronic medical problems.

## 2016-10-05 NOTE — Assessment & Plan Note (Signed)
No improvement with omnicef  - will discontinue Start doxycycline - this does not interfere with his seizure medications Lungs are clear - may benefit from short course of steroids, but that may increase risk of seizure, so will hold off tussionex cough syrup , tessalon perles prn Rest, fluids cxr today to rule out pneumonia

## 2016-10-06 ENCOUNTER — Ambulatory Visit (INDEPENDENT_AMBULATORY_CARE_PROVIDER_SITE_OTHER): Payer: Medicare Other | Admitting: Cardiology

## 2016-10-06 ENCOUNTER — Encounter: Payer: Self-pay | Admitting: Cardiology

## 2016-10-06 VITALS — BP 122/72 | HR 64 | Ht 68.0 in | Wt 253.0 lb

## 2016-10-06 DIAGNOSIS — I10 Essential (primary) hypertension: Secondary | ICD-10-CM | POA: Diagnosis not present

## 2016-10-06 DIAGNOSIS — I110 Hypertensive heart disease with heart failure: Secondary | ICD-10-CM | POA: Diagnosis not present

## 2016-10-06 DIAGNOSIS — R600 Localized edema: Secondary | ICD-10-CM | POA: Diagnosis not present

## 2016-10-06 DIAGNOSIS — I951 Orthostatic hypotension: Secondary | ICD-10-CM

## 2016-10-06 DIAGNOSIS — G4733 Obstructive sleep apnea (adult) (pediatric): Secondary | ICD-10-CM | POA: Diagnosis not present

## 2016-10-06 DIAGNOSIS — R0989 Other specified symptoms and signs involving the circulatory and respiratory systems: Secondary | ICD-10-CM

## 2016-10-06 MED ORDER — LOSARTAN POTASSIUM 25 MG PO TABS: 25.0000 mg | ORAL_TABLET | Freq: Every day | ORAL | 3 refills | Status: DC

## 2016-10-06 MED FILL — LOSARTAN POTASSIUM 25 MG TA: 25 | 90 days supply | Qty: 90 | Fill #0

## 2016-10-06 NOTE — Progress Notes (Signed)
Cardiology Office Note    Date:  10/06/2016  ID:  Jeff Weaver, DOB 1946/03/27, MRN [id number] PCP:  [name], MD  Cardiologist:  [name]   Chief Complaint: f/u blood pressure  History of Present Illness:  Jeff Weaver is a 71 y.o. male with history of obesity, HTN, HLD, chronic diastolic CHF/hypertensive heart disease, prediabetes, cerebral aneurysm s/p clipping at [facility] in 8/13 c/b short-term memory loss, cerebral hemorrhage and seizure disorder s/p VP shunt, sleep apnea, mild AI, sinus bradycardia (baseline HR 50s, 1st degree AV block) who presents for f/u BP.  First seen by Dr. [name] 04/2013 for chest pain with normal nuclear stress test. Admitted 11/2013 for chest pain and elevated troponin (0.74) -> LHC 11/26/13 revealed angiographically normal coronary arteries, mild mid LAD myocardial bridging, moderate to severely elevated LVEDP - with systemic hypertension and preserved LVEF. Echo with EF 60-65%, LVH, pt did not follow up after that. More recently admitted 01/2016 for confusion/fever with possible PNA then diagnosis of acute cholecystitis s/p removal of gallbladder. He's since been followed by PCP for ongoing confusion and fatigue, with treatment of UTI. 2D echo in 01/2016 showed mod LVH, EF 60-65%, grade 2 DD, no RWMA, mild AI. Labs 03/2016 with Na 134, K 4.4, glucose 102, normal TSH, A1C and CBC.  He was seen by Robbie Lis PA-C 03/25/16 reporting fluctuating BP, dizziness upon standing, intermittent chest discomfort and LEE. His neurologist had requested cardiology manage his BP. He had reported BP 110/70s AM and 097D-532D systolic in PM. He was positive for orthostasis in the office with BP dropping from 120/74 layng to 95/63 standing with associated dizziness. Procardia was felt to be contributing to edema so this was stopped. Compression hose were recommended. Nuclear stress test was normal, EF >65%.  06/28/2016 - this is first time I'm seeing this nice patient, he is accompanied by  his wife and daughter, he feels slightly better, his neurologist is still trying to tune his seizure medications. He feels higher, he is experiencing mild orthostatic hypotension especially when he gets up first in the morning however significantly improved after stopping nifedipine and no presyncope or syncope. He has noticed some mild lower extremity edema after he was advised by his neurologist to increase his salt intake. He will have occasional chest pain that are not related to exertion. He hasn't been walking as he has been dealing with plantar fasciitis.   10/06/2016, 3 months follow-up, patient states that he has had no chest pain or shortness of breath however developed cough 2 weeks ago that is productive. He has been started on a second round of antibiotic doxycycline and cough medicine by his primary care physician yesterday. He denies any fever or chills. No palpitation or syncope. He continues to have orthostatic hypotension on a daily basis. No lower extremity edema.  Past Medical History:  Diagnosis Date  . Aortic insufficiency   . Cerebral aneurysm    a. s/p clipping at [facility] in 8/13 c/b short-term memory loss, cerebral hemorrhage and seizure disorder s/p VP shunt.  . Cerebral hemorrhage ([place])   . Chronic diastolic CHF (congestive heart failure) ([place])   . CVA (cerebral infarction)   . Diverticulosis   . Enteritis (regional)    Dr Olevia Perches  . First degree AV block   . GI bleed   . HTN (hypertension)   . Hyperlipidemia   . Hypertensive heart disease   . IBS (irritable bowel syndrome)   . Orthostatic hypotension   .  Pre-diabetes   . 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 Rheumatoid arthritis(714.0)    dxed in [employer] in 1980s  . Seizures ([place])   . Sinus bradycardia   . Sleep apnea     Past Surgical History:  Procedure Laterality Date  . CHOLECYSTECTOMY N/A 01/07/2016   Procedure: LAPAROSCOPIC CHOLECYSTECTOMY ;  Surgeon: Coralie Keens, MD;  Location: [place];  Service: General;  Laterality: N/A;  . cns shunt  02/23/12  . COLONOSCOPY W/ POLYPECTOMY  1978   negative 2009, Dr Olevia Perches. Due 2014  . CRANIOTOMY  02/02/12   Dr Harvel Ricks, [place] of aneurysm  . 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 HERNIA REPAIR    . LEFT HEART CATHETERIZATION WITH CORONARY ANGIOGRAM N/A 11/26/2013   Procedure: LEFT HEART CATHETERIZATION WITH CORONARY ANGIOGRAM;  Surgeon: [name], MD;  Location: [facility] CATH LAB;  Service: Cardiovascular;  Laterality: N/A;  . NOSE SURGERY    . SHOULDER SURGERY  1997  . TONSILLECTOMY AND ADENOIDECTOMY    . VENTRICULOPERITONEAL SHUNT  02-23-12   INSERTION OF RIGHT FRONTAL VENTRICULOPERITONEAL SHUNT WITH A CODMAN HAKIM PROGRAMMABLE VALVE    Current Medications: Current Outpatient Prescriptions  Medication Sig Dispense Refill  . benzonatate (TESSALON) 200 MG capsule Take 1 capsule (200 mg total) by mouth 3 (three) times daily as needed for cough. 30 capsule 0  . blood glucose meter kit and supplies KIT Dispense based on patient and insurance preference. Use up to four times daily as directed. (prediabetes hypoglycemia). 1 each 0  . cetirizine (ZYRTEC) 10 MG tablet Take 1 tablet (10 mg total) by mouth daily. 30 tablet 11  . chlorpheniramine-HYDROcodone (TUSSIONEX PENNKINETIC ER) 10-8 MG/5ML SUER Take 5 mLs by mouth every 12 (twelve) hours as needed for cough. 150 mL 0  . doxycycline (VIBRA-TABS) 100 MG tablet Take 1 tablet (100 mg total) by mouth 2 (two) times daily. 20 tablet 0  . Eslicarbazepine Acetate 200 MG TABS Take 200 mg by mouth 2 (two) times daily.    . Lacosamide 100 MG TABS Take 100 mg by mouth 2 (two) times daily.    Marland Kitchen levETIRAcetam (KEPPRA) 500 MG tablet Take 500 mg by mouth 2 (two) times daily.     Marland Kitchen losartan (COZAAR) 50 MG tablet Take 1 tablet (50 mg total) by mouth daily. 90 tablet 3  . Melatonin 3 MG TABS Take 3 mg by mouth at bedtime.    .  mirabegron ER (MYRBETRIQ) 25 MG TB24 tablet Take 25 mg by mouth every evening.     . Multiple Vitamins-Minerals (MULTIVITAMIN) tablet Take 1 tablet by mouth daily.    . NON FORMULARY CPAP Machine: authorized by [facility] in [place]    . pyridOXINE (VITAMIN B-6) 50 MG tablet   11  . ranitidine (ZANTAC) 150 MG capsule Take 1 capsule (150 mg total) by mouth every evening. 90 capsule 1  . tacrolimus (PROTOPIC) 0.03 % ointment Apply topically 2 (two) times daily. To face 100 g 0  . Tamsulosin HCl (FLOMAX) 0.4 MG CAPS Take 0.8 mg by mouth at bedtime.     . thiamine 100 MG tablet Take 100 mg by mouth daily.     No current facility-administered medications for this visit.      Allergies:   Crestor [rosuvastatin]; Haloperidol; Simvastatin; Lisinopril; and Pravastatin   Social History   Social History  . Marital status: Married    Spouse name:  N/A  . Number of children: 1  . Years of education: N/A   Occupational History  . retired    Social History Main Topics  . Smoking status: Former Smoker    Quit date: 07/04/2009  . Smokeless tobacco: Never Used  . Alcohol use 0.0 oz/week     Comment: Very Infrequently   . Drug use: No  . Sexual activity: Not Asked   Other Topics Concern  . None   Social History Narrative  . None     Family History:  The patient's family history includes COPD in his father; Coronary artery disease in his father; Dementia (age of onset: 43) in his sister; Diabetes in his brother and sister; Hepatitis in his mother.   ROS:  + arthritis in multiple joints. Please see the history of present illness. All other systems are reviewed and otherwise negative.    PHYSICAL EXAM:   VS:  BP 122/72   Pulse 64   Ht 5' 8"  (1.727 m)   Wt 253 lb (114.8 kg)   SpO2 98%   BMI 38.47 kg/m   BMI: Body mass index is 38.47 kg/m. GEN: Well nourished, well developed WM, obese, in no acute distress  HEENT: normocephalic, atraumatic Neck: no JVD, carotid bruits, or masses Cardiac: RRR;  no murmurs, rubs, or gallops, no edema  Respiratory:  clear to auscultation bilaterally, normal work of breathing GI: soft, nontender, nondistended, + BS MS: no deformity or atrophy  Skin: warm and dry, no rash Neuro:  Alert and Oriented x 3, Strength and sensation are intact, follows commands Psych: euthymic mood, full affect  Wt Readings from Last 3 Encounters:  10/06/16 253 lb (114.8 kg)  10/05/16 254 lb (115.2 kg)  09/26/16 260 lb (117.9 kg)      Studies/Labs Reviewed:   EKG: Personally reviewed, shows sinus rhythm first degree AV block, LVH, unchanged from prior.   Recent Labs: 01/05/2016: B Natriuretic Peptide 102.5 03/15/2016: TSH 1.29 04/19/2016: Hemoglobin 15.2; Magnesium 2.0; Platelets 185.0 07/25/2016: ALT 46; BUN 15; Creatinine, Ser 0.99; Potassium 4.7; Sodium 138   Lipid Panel    Component Value Date/Time   CHOL 203 (H) 07/25/2016 1007   CHOL 159 03/27/2013 0818   TRIG 169.0 (H) 07/25/2016 1007   TRIG 127 03/27/2013 0818   HDL 49.20 07/25/2016 1007   HDL 45 03/27/2013 0818   CHOLHDL 4 07/25/2016 1007   VLDL 33.8 07/25/2016 1007   LDLCALC 120 (H) 07/25/2016 1007   LDLCALC 89 03/27/2013 0818   LDLDIRECT 87.6 09/19/2012 1206    Additional studies/ records that were reviewed today include: Summarized above    ASSESSMENT & PLAN:   1. Orthostatic hypotension/labile hypertension - improved after cessation of nifedipine. I will decrease losartan from 50-25 mg daily. 2. Chest pain - recent nuc normal, unlikely cardiac in nature. He is now asymptomatic.  3. Chronic diastolic CHF - resolved.  4. Obstructive sleep apnea - The patient has f/u coming up at the [place] soon to review his CPAP therapy and sleep apnea. He is inconsistent with use which could also contribute to BP lability.  Disposition: F/u with Dr. [name] in 6 months.   Medication Adjustments/Labs and Tests Ordered: Current medicines are reviewed at length with the patient today.  Concerns regarding  medicines are outlined above. Medication changes, Labs and Tests ordered today are summarized above and listed in the Patient Instructions accessible in Encounters.   Signed, Ena Dawley, MD 10/06/2016 9:39 AM    [facility] [place], [place], [place]  [address] Phone: [phone]; Fax: [phone]

## 2016-10-06 NOTE — Patient Instructions (Addendum)
Medication Instructions:  DECREASE Losartan (Cozaar) to 25 mg once daily   Labwork: None Ordered   Testing/Procedures: None Ordered   Follow-Up: Your physician wants you to follow-up in: 6 months with Dr. [name].  You will receive a reminder letter in the mail two months in advance. If you don't receive a letter, please call our office to schedule the follow-up appointment.   If you need a refill on your cardiac medications before your next appointment, please call your pharmacy.   Thank you for choosing [facility]! Christen Bame, RN [phone]

## 2016-10-13 ENCOUNTER — Telehealth: Payer: Self-pay | Admitting: Internal Medicine

## 2016-10-13 NOTE — Telephone Encounter (Signed)
States patient is on antibiotic, cough syrup, three other medications he takes for seizures.  Thinks cough medication may be interacting with seizure medication. Is discontinuing.  Would like follow up call in regard.

## 2016-10-13 NOTE — Telephone Encounter (Signed)
Please advise 

## 2016-10-13 NOTE — Telephone Encounter (Signed)
Spoke with pt and wife to give MDs response.

## 2016-10-13 NOTE — Telephone Encounter (Signed)
Stop - can take otc robitussin

## 2016-10-21 DIAGNOSIS — M810 Age-related osteoporosis without current pathological fracture: Secondary | ICD-10-CM | POA: Diagnosis not present

## 2016-12-22 MED FILL — PREVIDENT 5000 BOOSTER PLUS: 1.1 | 30 days supply | Qty: 100 | Fill #0

## 2016-12-27 DIAGNOSIS — Z87891 Personal history of nicotine dependence: Secondary | ICD-10-CM | POA: Diagnosis not present

## 2016-12-27 DIAGNOSIS — G40109 Localization-related (focal) (partial) symptomatic epilepsy and epileptic syndromes with simple partial seizures, not intractable, without status epilepticus: Secondary | ICD-10-CM | POA: Diagnosis not present

## 2016-12-27 DIAGNOSIS — I619 Nontraumatic intracerebral hemorrhage, unspecified: Secondary | ICD-10-CM | POA: Diagnosis not present

## 2016-12-27 DIAGNOSIS — G4733 Obstructive sleep apnea (adult) (pediatric): Secondary | ICD-10-CM | POA: Diagnosis not present

## 2016-12-27 DIAGNOSIS — Z9989 Dependence on other enabling machines and devices: Secondary | ICD-10-CM | POA: Diagnosis not present

## 2016-12-27 DIAGNOSIS — I10 Essential (primary) hypertension: Secondary | ICD-10-CM | POA: Diagnosis not present

## 2016-12-27 DIAGNOSIS — Z888 Allergy status to other drugs, medicaments and biological substances status: Secondary | ICD-10-CM | POA: Diagnosis not present

## 2016-12-27 DIAGNOSIS — E785 Hyperlipidemia, unspecified: Secondary | ICD-10-CM | POA: Diagnosis not present

## 2016-12-27 DIAGNOSIS — G919 Hydrocephalus, unspecified: Secondary | ICD-10-CM | POA: Diagnosis not present

## 2016-12-27 DIAGNOSIS — Z8673 Personal history of transient ischemic attack (TIA), and cerebral infarction without residual deficits: Secondary | ICD-10-CM | POA: Diagnosis not present

## 2016-12-27 DIAGNOSIS — Z982 Presence of cerebrospinal fluid drainage device: Secondary | ICD-10-CM | POA: Diagnosis not present

## 2016-12-27 DIAGNOSIS — Z8679 Personal history of other diseases of the circulatory system: Secondary | ICD-10-CM | POA: Diagnosis not present

## 2016-12-27 DIAGNOSIS — G40909 Epilepsy, unspecified, not intractable, without status epilepticus: Secondary | ICD-10-CM | POA: Diagnosis not present

## 2016-12-27 DIAGNOSIS — Z9889 Other specified postprocedural states: Secondary | ICD-10-CM | POA: Diagnosis not present

## 2016-12-27 DIAGNOSIS — F329 Major depressive disorder, single episode, unspecified: Secondary | ICD-10-CM | POA: Diagnosis not present

## 2016-12-27 DIAGNOSIS — Z79899 Other long term (current) drug therapy: Secondary | ICD-10-CM | POA: Diagnosis not present

## 2016-12-27 DIAGNOSIS — F4310 Post-traumatic stress disorder, unspecified: Secondary | ICD-10-CM | POA: Diagnosis not present

## 2016-12-28 DIAGNOSIS — Z8679 Personal history of other diseases of the circulatory system: Secondary | ICD-10-CM | POA: Insufficient documentation

## 2016-12-31 DIAGNOSIS — Z888 Allergy status to other drugs, medicaments and biological substances status: Secondary | ICD-10-CM | POA: Diagnosis not present

## 2016-12-31 DIAGNOSIS — Z8673 Personal history of transient ischemic attack (TIA), and cerebral infarction without residual deficits: Secondary | ICD-10-CM | POA: Diagnosis not present

## 2016-12-31 DIAGNOSIS — G40901 Epilepsy, unspecified, not intractable, with status epilepticus: Secondary | ICD-10-CM | POA: Diagnosis not present

## 2016-12-31 DIAGNOSIS — R569 Unspecified convulsions: Secondary | ICD-10-CM | POA: Diagnosis not present

## 2016-12-31 DIAGNOSIS — J158 Pneumonia due to other specified bacteria: Secondary | ICD-10-CM | POA: Diagnosis not present

## 2016-12-31 DIAGNOSIS — G40909 Epilepsy, unspecified, not intractable, without status epilepticus: Secondary | ICD-10-CM | POA: Diagnosis not present

## 2016-12-31 DIAGNOSIS — I252 Old myocardial infarction: Secondary | ICD-10-CM | POA: Diagnosis not present

## 2016-12-31 DIAGNOSIS — Z982 Presence of cerebrospinal fluid drainage device: Secondary | ICD-10-CM | POA: Diagnosis not present

## 2016-12-31 DIAGNOSIS — Z9981 Dependence on supplemental oxygen: Secondary | ICD-10-CM | POA: Diagnosis not present

## 2016-12-31 DIAGNOSIS — G4733 Obstructive sleep apnea (adult) (pediatric): Secondary | ICD-10-CM | POA: Diagnosis not present

## 2016-12-31 DIAGNOSIS — R739 Hyperglycemia, unspecified: Secondary | ICD-10-CM | POA: Diagnosis not present

## 2016-12-31 DIAGNOSIS — I10 Essential (primary) hypertension: Secondary | ICD-10-CM | POA: Diagnosis not present

## 2016-12-31 DIAGNOSIS — J9600 Acute respiratory failure, unspecified whether with hypoxia or hypercapnia: Secondary | ICD-10-CM | POA: Diagnosis not present

## 2016-12-31 DIAGNOSIS — A419 Sepsis, unspecified organism: Secondary | ICD-10-CM | POA: Diagnosis not present

## 2016-12-31 DIAGNOSIS — Z7952 Long term (current) use of systemic steroids: Secondary | ICD-10-CM | POA: Diagnosis not present

## 2016-12-31 DIAGNOSIS — E785 Hyperlipidemia, unspecified: Secondary | ICD-10-CM | POA: Diagnosis not present

## 2016-12-31 DIAGNOSIS — J9610 Chronic respiratory failure, unspecified whether with hypoxia or hypercapnia: Secondary | ICD-10-CM | POA: Diagnosis not present

## 2017-01-03 ENCOUNTER — Emergency Department (HOSPITAL_BASED_OUTPATIENT_CLINIC_OR_DEPARTMENT_OTHER)
Admission: EM | Admit: 2017-01-03 | Discharge: 2017-01-03 | Disposition: A | Discharge status: Home / Self Care | Payer: Medicare Other | Attending: Emergency Medicine | Admitting: Emergency Medicine

## 2017-01-03 ENCOUNTER — Telehealth: Payer: Self-pay | Admitting: Internal Medicine

## 2017-01-03 ENCOUNTER — Encounter (HOSPITAL_BASED_OUTPATIENT_CLINIC_OR_DEPARTMENT_OTHER): Payer: Self-pay | Admitting: Emergency Medicine

## 2017-01-03 ENCOUNTER — Emergency Department (HOSPITAL_BASED_OUTPATIENT_CLINIC_OR_DEPARTMENT_OTHER): Payer: Medicare Other

## 2017-01-03 DIAGNOSIS — R2231 Localized swelling, mass and lump, right upper limb: Secondary | ICD-10-CM | POA: Diagnosis present

## 2017-01-03 DIAGNOSIS — I5032 Chronic diastolic (congestive) heart failure: Secondary | ICD-10-CM | POA: Diagnosis not present

## 2017-01-03 DIAGNOSIS — L03119 Cellulitis of unspecified part of limb: Secondary | ICD-10-CM | POA: Insufficient documentation

## 2017-01-03 DIAGNOSIS — I110 Hypertensive heart disease with heart failure: Secondary | ICD-10-CM | POA: Diagnosis not present

## 2017-01-03 DIAGNOSIS — I809 Phlebitis and thrombophlebitis of unspecified site: Secondary | ICD-10-CM | POA: Diagnosis not present

## 2017-01-03 DIAGNOSIS — M7989 Other specified soft tissue disorders: Secondary | ICD-10-CM | POA: Diagnosis not present

## 2017-01-03 DIAGNOSIS — I82612 Acute embolism and thrombosis of superficial veins of left upper extremity: Secondary | ICD-10-CM | POA: Diagnosis not present

## 2017-01-03 DIAGNOSIS — L03114 Cellulitis of left upper limb: Secondary | ICD-10-CM | POA: Diagnosis not present

## 2017-01-03 DIAGNOSIS — Z79899 Other long term (current) drug therapy: Secondary | ICD-10-CM | POA: Diagnosis not present

## 2017-01-03 DIAGNOSIS — I808 Phlebitis and thrombophlebitis of other sites: Secondary | ICD-10-CM

## 2017-01-03 DIAGNOSIS — Z87891 Personal history of nicotine dependence: Secondary | ICD-10-CM | POA: Insufficient documentation

## 2017-01-03 DIAGNOSIS — L0390 Cellulitis, unspecified: Secondary | ICD-10-CM

## 2017-01-03 DIAGNOSIS — I82611 Acute embolism and thrombosis of superficial veins of right upper extremity: Secondary | ICD-10-CM | POA: Diagnosis not present

## 2017-01-03 LAB — CBC WITH DIFFERENTIAL/PLATELET
Basophils Absolute: 0 10*3/uL (ref 0.0–0.1)
Basophils Relative: 1 %
Eosinophils Absolute: 0.2 10*3/uL (ref 0.0–0.7)
Eosinophils Relative: 3 %
HCT: 44.2 % (ref 39.0–52.0)
Hemoglobin: 14.9 g/dL (ref 13.0–17.0)
Lymphocytes Relative: 20 %
Lymphs Abs: 1.6 10*3/uL (ref 0.7–4.0)
MCH: 31.2 pg (ref 26.0–34.0)
MCHC: 33.7 g/dL (ref 30.0–36.0)
MCV: 92.7 fL (ref 78.0–100.0)
Monocytes Absolute: 0.7 10*3/uL (ref 0.1–1.0)
Monocytes Relative: 9 %
Neutro Abs: 5.5 10*3/uL (ref 1.7–7.7)
Neutrophils Relative %: 67 %
Platelets: 166 10*3/uL (ref 150–400)
RBC: 4.77 MIL/uL (ref 4.22–5.81)
RDW: 13.9 % (ref 11.5–15.5)
WBC: 8.2 10*3/uL (ref 4.0–10.5)

## 2017-01-03 LAB — COMPREHENSIVE METABOLIC PANEL
ALT: 29 U/L (ref 17–63)
AST: 24 U/L (ref 15–41)
Albumin: 3.5 g/dL (ref 3.5–5.0)
Alkaline Phosphatase: 38 U/L (ref 38–126)
Anion gap: 7 (ref 5–15)
BUN: 10 mg/dL (ref 6–20)
CO2: 28 mmol/L (ref 22–32)
Calcium: 8.8 mg/dL — ABNORMAL LOW (ref 8.9–10.3)
Chloride: 102 mmol/L (ref 101–111)
Creatinine, Ser: 0.79 mg/dL (ref 0.61–1.24)
GFR calc Af Amer: >60 mL/min (ref >60)
GFR calc non Af Amer: >60 mL/min (ref >60)
Glucose, Bld: 113 mg/dL — ABNORMAL HIGH (ref 65–99)
Potassium: 3.6 mmol/L (ref 3.5–5.1)
Sodium: 137 mmol/L (ref 135–145)
Total Bilirubin: 0.6 mg/dL (ref 0.3–1.2)
Total Protein: 6.8 g/dL (ref 6.5–8.1)

## 2017-01-03 LAB — PROTIME-INR
INR: 1.02
Prothrombin Time: 13.4 seconds (ref 11.4–15.2)

## 2017-01-03 LAB — APTT: aPTT: 27 seconds (ref 24–36)

## 2017-01-03 MED ORDER — CEPHALEXIN 500 MG PO CAPS
500.0000 mg | ORAL_CAPSULE | Freq: Three times a day (TID) | ORAL | 0 refills | Status: DC
Start: 2017-01-03 — End: 2017-01-10

## 2017-01-03 MED ORDER — CEPHALEXIN 500 MG PO CAPS: 500.0000 mg | ORAL_CAPSULE | Freq: Three times a day (TID) | ORAL | 0 refills | Status: DC

## 2017-01-03 NOTE — ED Notes (Addendum)
Pt in  [place] will get vitals when Pt returns

## 2017-01-03 NOTE — ED Notes (Signed)
ED Provider at bedside. 

## 2017-01-03 NOTE — ED Triage Notes (Signed)
Patients finger tips are blue and have sluggish cap refill noted. The patient has a strong radial pulse

## 2017-01-03 NOTE — ED Provider Notes (Signed)
[place] DEPT [facility] Provider Note   CSN: [id number] Arrival date & time: 01/03/17  1514     History   Chief Complaint Chief Complaint  Patient presents with  . Arm Swelling    HPI Jeff Weaver is a 71 y.o. male.  HPI   71 year old male with a history of recent admission for concern of seizure with status epilepticus intubated in the ICU with discharge yesterday, polymyalgia rheumatica, history of N STEMI, history of subdural, history of CVA, hyperlipidemia, VP shunt, hypertension who presents with concern for right upper extremity swelling and pain. Patient was discharged from the hospital yesterday, he noted upon getting home last night that he had some increased swelling to the right arm. Reports he had multiple needle sticks, and has contusions to his bilateral arms, however noted contusions on the left were not associated with pain, however contusions on the right had associated pain, any noted other swelling of the right upper extremity, as well as erythema to the distal portion so she was pain which was 6 out of 10, and after Tylenol is now 4 out of 10. Denies fevers, chest pain, shortness of breath. Reports he's had some mild cough and throat discomfort since he was in the hospital.  Past Medical History:  Diagnosis Date  . Aortic insufficiency   . Cerebral aneurysm    a. s/p clipping at [facility] in 8/13 c/b short-term memory loss, cerebral hemorrhage and seizure disorder s/p VP shunt.  . Cerebral hemorrhage ([place])   . Chronic diastolic CHF (congestive heart failure) ([place])   . CVA (cerebral infarction)   . Diverticulosis   . Enteritis (regional)    Dr Olevia Perches  . First degree AV block   . GI bleed   . HTN (hypertension)   . Hyperlipidemia   . Hypertensive heart disease   . IBS (irritable bowel syndrome)   . Orthostatic hypotension   . Pre-diabetes   . Regional enteritis of large intestine ([place]) since 1978  . Rheumatoid arthritis(714.0)    dxed in [employer] in 1980s  .  Seizures ([place])   . Sinus bradycardia   . Sleep apnea     Patient Active Problem List   Diagnosis Date Noted  . Cough 10/05/2016  . SOB (shortness of breath) 10/05/2016  . Acute bronchitis 09/26/2016  . Seborrheic dermatitis 07/25/2016  . Cognitive communication deficit 06/28/2016  . Hypertensive heart disease 06/28/2016  . Vitamin B 12 deficiency 03/16/2016  . Memory difficulties 03/13/2016  . Confusion 03/13/2016  . 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 Orthostatic hypotension 01/05/2016  . Leukemoid reaction 12/07/2014  . Benign prostatic hyperplasia with urinary obstruction 08/04/2014  . Urinary urgency 08/04/2014  . Gastroesophageal reflux disease 07/14/2014  . Polymyalgia rheumatica ([place]) 05/28/2014  . 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 Acute non-ST-elevation myocardial infarction ([place]) 11/25/2013  . SBE (subacute bacterial endocarditis) prophylaxis candidate 11/24/2013  . Aphasia 07/17/2013  . 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 seizures, not intractable, without status epilepticus ([place]) 07/17/2013  . Subdural hemorrhage ([place]) 07/17/2013  . Pre-diabetes 03/26/2013  . Increased frequency of urination 10/31/2012  . Nocturia 10/31/2012  . History of cerebral aneurysm repair 10/18/2012  . History of stroke without residual deficits 10/18/2012  . Blepharitis of both eyes 09/27/2012  . 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 Seizure disorder ([place]) 07/23/2012  . Entropion 07/10/2012  . Hyperopia with astigmatism 07/10/2012  . Combined senile cataract 04/13/2012  . Presbyopia 04/13/2012  . Stroke due to occlusion of  left middle cerebral artery ([place]) 03/01/2012  . Cognitive safety issue 02/20/2012  . Nonruptured cerebral aneurysm 11/23/2011  . Obstructive sleep apnea syndrome 08/09/2011  . Hyperlipidemia 02/03/2009  . HTN (hypertension) 02/03/2009  . First degree atrioventricular block 02/03/2009  . OSTEOARTHRITIS  02/03/2009  . REGIONAL ENTERITIS, LARGE INTESTINE 04/17/2008    Past Surgical History:  Procedure Laterality Date  . CHOLECYSTECTOMY N/A 01/07/2016   Procedure: LAPAROSCOPIC CHOLECYSTECTOMY ;  Surgeon: Coralie Keens, MD;  Location: [place];  Service: General;  Laterality: N/A;  . cns shunt  02/23/12  . COLONOSCOPY W/ POLYPECTOMY  1978   negative 2009, Dr Olevia Perches. Due 2014  . CRANIOTOMY  02/02/12   Dr Harvel Ricks, [place] of aneurysm  . 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 HERNIA REPAIR    . LEFT HEART CATHETERIZATION WITH CORONARY ANGIOGRAM N/A 11/26/2013   Procedure: LEFT HEART CATHETERIZATION WITH CORONARY ANGIOGRAM;  Surgeon: [name], MD;  Location: [facility] CATH LAB;  Service: Cardiovascular;  Laterality: N/A;  . NOSE SURGERY    . SHOULDER SURGERY  1997  . TONSILLECTOMY AND ADENOIDECTOMY    . VENTRICULOPERITONEAL SHUNT  02-23-12   INSERTION OF RIGHT FRONTAL VENTRICULOPERITONEAL SHUNT WITH A CODMAN HAKIM PROGRAMMABLE VALVE       Home Medications    Prior to Admission medications   Medication Sig Start Date End Date Taking? Authorizing Provider  benzonatate (TESSALON) 200 MG capsule Take 1 capsule (200 mg total) by mouth 3 (three) times daily as needed for cough. 09/26/16   [name], MD  blood glucose meter kit and supplies KIT Dispense based on patient and insurance preference. Use up to four times daily as directed. (prediabetes hypoglycemia). 10/26/15   [name], MD  cephALEXin (KEFLEX) 500 MG capsule Take 1 capsule (500 mg total) by mouth 3 (three) times daily. 01/03/17 01/10/17  Gareth Morgan, MD  cetirizine (ZYRTEC) 10 MG tablet Take 1 tablet (10 mg total) by mouth daily. 10/26/15   [name], MD  chlorpheniramine-HYDROcodone (TUSSIONEX PENNKINETIC ER) 10-8 MG/5ML SUER Take 5 mLs by mouth every 12 (twelve) hours as needed for cough. 10/05/16   [name], MD  doxycycline (VIBRA-TABS)  100 MG tablet Take 1 tablet (100 mg total) by mouth 2 (two) times daily. 10/05/16   [name], MD  Eslicarbazepine Acetate 200 MG TABS Take 200 mg by mouth 2 (two) times daily.    [provider]  Lacosamide 100 MG TABS Take 100 mg by mouth 2 (two) times daily.    [provider]  levETIRAcetam (KEPPRA) 500 MG tablet Take 500 mg by mouth 2 (two) times daily.     [provider]  losartan (COZAAR) 25 MG tablet Take 1 tablet (25 mg total) by mouth daily. 10/06/16 01/04/17  Dorothy Spark, MD  Melatonin 3 MG TABS Take 3 mg by mouth at bedtime.    [provider]  mirabegron ER (MYRBETRIQ) 25 MG TB24 tablet Take 25 mg by mouth every evening.  01/26/15   [provider]  Multiple Vitamins-Minerals (MULTIVITAMIN) tablet Take 1 tablet by mouth daily. 10/26/15   [name], MD  NON FORMULARY CPAP Machine: authorized by [facility] in [facility]    [provider]  pyridOXINE (VITAMIN B-6) 50 MG tablet  07/13/16   [provider]  ranitidine (ZANTAC) 150 MG capsule Take 1 capsule (150 mg total) by mouth every evening. 07/25/16  [name], MD  tacrolimus (PROTOPIC) 0.03 % ointment Apply topically 2 (two) times daily. To face 07/25/16   [name], MD  Tamsulosin HCl (FLOMAX) 0.4 MG CAPS Take 0.8 mg by mouth at bedtime.     [provider]  thiamine 100 MG tablet Take 100 mg by mouth daily.    [provider]    Family History Family History  Problem Relation Age of Onset  . COPD Father   . Coronary artery disease Father        MI in 81s  . Hepatitis Mother   . Diabetes Sister   . Dementia Sister 63  . Diabetes Brother   . Colon cancer Neg Hx     Social History Social History  Substance Use Topics  . Smoking status: Former Smoker    Quit date: 07/04/2009  . Smokeless tobacco: Never Used  . Alcohol use 0.0 oz/week     Comment: Very Infrequently      Allergies   Crestor [rosuvastatin]; Haloperidol; Simvastatin;  Lisinopril; and Pravastatin   Review of Systems Review of Systems  Constitutional: Positive for fatigue (generalized weakness since being in ICU). Negative for fever.  HENT: Positive for sore throat (mild after intubation).   Eyes: Negative for visual disturbance.  Respiratory: Positive for cough (mild). Negative for shortness of breath.   Cardiovascular: Negative for chest pain.  Gastrointestinal: Negative for abdominal pain (soreness when sitting up), nausea and vomiting.  Genitourinary: Negative for difficulty urinating.  Musculoskeletal: Positive for arthralgias. Negative for back pain and neck stiffness.  Skin: Negative for rash.  Neurological: Negative for syncope and headaches.     Physical Exam Updated Vital Signs BP (!) 159/87   Pulse (!) 57   Temp 98.3 F (36.8 C) (Oral)   Resp 18   Ht 5' 8"  (1.727 m)   Wt 117.9 kg (260 lb)   SpO2 95%   BMI 39.53 kg/m   Physical Exam  Constitutional: He is oriented to person, place, and time. He appears well-developed and well-nourished. No distress.  HENT:  Head: Normocephalic and atraumatic.  Eyes: Conjunctivae and EOM are normal.  Neck: Normal range of motion.  Cardiovascular: Normal rate, regular rhythm, normal heart sounds and intact distal pulses.  Exam reveals no gallop and no friction rub.   No murmur heard. Pulmonary/Chest: Effort normal and breath sounds normal. No respiratory distress. He has no wheezes. He has no rales.  Abdominal: Soft. He exhibits no distension. There is no tenderness. There is no guarding.  Musculoskeletal: He exhibits no edema.       Right wrist: He exhibits normal range of motion and no bony tenderness.  Neurological: He is alert and oriented to person, place, and time.  Skin: Skin is warm and dry. He is not diaphoretic.  Ecchymoses bilateral arms Right distal forearm with erythema extending onto dorsum of hand and onto flexor surface of forearm  Nursing note and vitals reviewed.    ED  Treatments / Results  Labs (all labs ordered are listed, but only abnormal results are displayed) Labs Reviewed  COMPREHENSIVE METABOLIC PANEL - Abnormal; Notable for the following:       Result Value   Glucose, Bld 113 (*)    Calcium 8.8 (*)    All other components within normal limits  CBC WITH DIFFERENTIAL/PLATELET  APTT  PROTIME-INR    EKG  EKG Interpretation None       Radiology US Venous Img Upper Uni Right  Result  Date: 01/03/2017 CLINICAL DATA:  71 year old male with right arm pain, redness and swelling. Multiple recent right arm IV sticks. EXAM: RIGHT UPPER EXTREMITY VENOUS DOPPLER ULTRASOUND TECHNIQUE: Gray-scale sonography with graded compression, as well as color Doppler and duplex ultrasound were performed to evaluate the upper extremity deep venous system from the level of the subclavian vein and including the jugular, axillary, basilic, radial, ulnar and upper cephalic vein. Spectral Doppler was utilized to evaluate flow at rest and with distal augmentation maneuvers. COMPARISON:  None. FINDINGS: Occlusive thrombus within the basilic vein noted. There is no evidence of DVT within the left subclavian, internal jugular, subclavian, axillary, brachials, radial or ulnar veins. The cephalic vein is patent without thrombus. 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 No evidence of thrombus within the deep venous system of the right upper extremity. Results were called by telephone at the time of interpretation on 01/03/2017 at 6:52 pm to Dr. Gareth Morgan , who verbally acknowledged these results. Electronically Signed   By: Margarette Canada M.D.   On: 01/03/2017 18:52    Procedures Procedures (including critical care time)  Medications Ordered in ED Medications - No data to display   Initial Impression / Assessment and Plan / ED Course  I have reviewed the triage vital signs and the nursing notes.  Pertinent labs & imaging results that were  available during my care of the patient were reviewed by me and considered in my medical decision making (see chart for details).     71 year old male with a history of recent admission for concern of seizure with status epilepticus intubated in the ICU with discharge yesterday, polymyalgia rheumatica, history of N STEMI, history of subdural, history of CVA, hyperlipidemia, VP shunt, hypertension who presents with concern for right upper extremity swelling and pain. Given patient's hospitalization, contusions, lab work was done which showed no evidence of HIT, no other significant abnormalities. Right upper arm ultrasound showed no sign of DVT however shows superficial vein thrombus. Presentation consistent with superficial thrombophlebitis, recommend warm compresses, antiinflammatories.  Patient with erythema of the right hand, wrist and forearm, concerning for cellulitis as well. He is given a prescription for Keflex. Recommend continued monitoring and PCP follow-up.  Final Clinical Impressions(s) / ED Diagnoses   Final diagnoses:  Arm swelling  Superficial thrombophlebitis of right upper extremity  Cellulitis, unspecified cellulitis site    New Prescriptions Discharge Medication List as of 01/03/2017  7:11 PM    START taking these medications   Details  cephALEXin (KEFLEX) 500 MG capsule Take 1 capsule (500 mg total) by mouth 3 (three) times daily., Starting Tue 01/03/2017, Until Tue 01/10/2017, Print         Gareth Morgan, MD 01/04/17 [phone]

## 2017-01-03 NOTE — Discharge Instructions (Signed)
Apply warm compresses three times daily to affected area.

## 2017-01-03 NOTE — Telephone Encounter (Signed)
Patient Name: Jeff Weaver  DOB: [birth year]    Initial Comment Caller says, husband has seizures was admitted to [facility] on Sat, was intubated and had an IV in Rt hand. Now arm is swollen and wrist is aching. The pain is going up his arm.    Nurse Assessment  Nurse: Thad Ranger RN, Denise Date/Time (Eastern Time): 01/03/2017 2:03:28 PM  Confirm and document reason for call. If symptomatic, describe symptoms. ---Caller says, husband has seizures was admitted to [facility] on Sat, was intubated and had an IV in Rt hand. Now arm is swollen and wrist is aching. The pain is going up his arm.  Does the patient have any new or worsening symptoms? ---Yes  Will a triage be completed? ---Yes  Related visit to physician within the last 2 weeks? ---Yes  Does the PT have any chronic conditions? (i.e. diabetes, asthma, etc.) ---Yes  List chronic conditions. ---Sz disorder  Is this a behavioral health or substance abuse call? ---No     Guidelines    Guideline Title Affirmed Question Affirmed Notes  IV Site (Skin) Symptoms Arm is swollen, new onset (or leg swelling if IV in lower extremity)    Final Disposition User   Go to ED Now [place], RN, Langley Gauss    Comments  Based on RN clinical knowledge, advised to keep warm compresses on the right arm and have pt elevate arm above heart. Verb understanding.   Referrals  [facility] - ED  [facility] - ED   Disagree/Comply: Comply

## 2017-01-03 NOTE — ED Triage Notes (Signed)
Patient was recently d/c'd from the hospital after a multiple day stay. The patient started to have swelling to his A/C area and down into his right arm and hand with redness and bruising

## 2017-01-03 NOTE — ED Notes (Signed)
MD and primary RN made aware of arm and fingers assessment

## 2017-01-04 ENCOUNTER — Encounter: Payer: Self-pay | Admitting: Internal Medicine

## 2017-01-05 DIAGNOSIS — G919 Hydrocephalus, unspecified: Secondary | ICD-10-CM | POA: Diagnosis not present

## 2017-01-05 DIAGNOSIS — I671 Cerebral aneurysm, nonruptured: Secondary | ICD-10-CM | POA: Diagnosis not present

## 2017-01-05 DIAGNOSIS — Z982 Presence of cerebrospinal fluid drainage device: Secondary | ICD-10-CM | POA: Diagnosis not present

## 2017-01-05 DIAGNOSIS — I10 Essential (primary) hypertension: Secondary | ICD-10-CM | POA: Diagnosis not present

## 2017-01-05 DIAGNOSIS — G4733 Obstructive sleep apnea (adult) (pediatric): Secondary | ICD-10-CM | POA: Diagnosis not present

## 2017-01-05 DIAGNOSIS — Z9889 Other specified postprocedural states: Secondary | ICD-10-CM | POA: Diagnosis not present

## 2017-01-05 DIAGNOSIS — G40109 Localization-related (focal) (partial) symptomatic epilepsy and epileptic syndromes with simple partial seizures, not intractable, without status epilepticus: Secondary | ICD-10-CM | POA: Diagnosis not present

## 2017-01-05 DIAGNOSIS — E785 Hyperlipidemia, unspecified: Secondary | ICD-10-CM | POA: Diagnosis not present

## 2017-01-05 DIAGNOSIS — Z8673 Personal history of transient ischemic attack (TIA), and cerebral infarction without residual deficits: Secondary | ICD-10-CM | POA: Diagnosis not present

## 2017-01-05 DIAGNOSIS — Z87891 Personal history of nicotine dependence: Secondary | ICD-10-CM | POA: Diagnosis not present

## 2017-01-05 DIAGNOSIS — Z9989 Dependence on other enabling machines and devices: Secondary | ICD-10-CM | POA: Diagnosis not present

## 2017-01-08 DIAGNOSIS — I809 Phlebitis and thrombophlebitis of unspecified site: Secondary | ICD-10-CM | POA: Insufficient documentation

## 2017-01-08 DIAGNOSIS — L0390 Cellulitis, unspecified: Secondary | ICD-10-CM | POA: Insufficient documentation

## 2017-01-08 NOTE — Patient Instructions (Addendum)
Continue the "red pill" for the skin infection for an additional three days.   Add amlodipine 2.5 mg daily for your blood pressure.     Your prescription(s) have been submitted to your pharmacy. Please take as directed and contact our office if you believe you are having problem(s) with the medication(s).

## 2017-01-08 NOTE — Progress Notes (Signed)
Subjective:    Patient ID: Jeff Weaver, male    DOB: 10/07/1945, 71 y.o.   MRN: [id number]  HPI He is here for follow up from the hospital.  6/30 - 7/2:  He was admitted for a seizure.  He was intubated for airway protection and started on propofol for seizure suppression.  Ct of the head was negative for acute intracranial process.  He was admitted to the ICU.  He was extubated 01/01/17.  He did receive Zosyn as secondary prevention of aspiration pneumonitis.  He had an awake and asleep EEG was moderately abnormal with diffuse slowing as well as left frontal slowing and sharp waves.  It represented a post ictal state along with seizure focus.    7/3:  He went to the ED for superficial thrombophlebitis.  He had right upper extremity swelling and pain.  He first noticed it when he got from the hospital the day prior.  He did have multiple needle sticks while in the hospital.  He had erythema in the distal portion of the arm and his pain was 6/10.  He received tylenol and it decreased to 4/10.  He denied fever, chest pain, SOB.  He did have some mild cough and throat discomfort since being int he hospital.  An US of the RUE showed no DVT, but superficial thrombophlebitis.  He was started on keflex and recommended to use warm compresses and anti-inflammatories.    Superficial thrombophlebitis:  He finishes the antibiotic today.  His arm is still tender and still painful in the lower arm.  He denies fever/chills.   Blood pressure:  He is taking his medication daily.  His BP has been 140's/86.  He has had several 150's and one 120 this morning systolically.  He denies chest pain, palpitations and SOB.  He denies headaches and lightheadedness.   Medications and allergies reviewed with patient and updated if appropriate.  Patient Active Problem List   Diagnosis Date Noted  . Cellulitis 01/08/2017  . Superficial thrombophlebitis 01/08/2017  . Cough 10/05/2016  . SOB (shortness of breath) 10/05/2016   . Acute bronchitis 09/26/2016  . Seborrheic dermatitis 07/25/2016  . Cognitive communication deficit 06/28/2016  . Hypertensive heart disease 06/28/2016  . Vitamin B 12 deficiency 03/16/2016  . Memory difficulties 03/13/2016  . Confusion 03/13/2016  . 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 Orthostatic hypotension 01/05/2016  . Leukemoid reaction 12/07/2014  . Benign prostatic hyperplasia with urinary obstruction 08/04/2014  . Urinary urgency 08/04/2014  . Gastroesophageal reflux disease 07/14/2014  . Polymyalgia rheumatica ([place]) 05/28/2014  . 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 Acute non-ST-elevation myocardial infarction ([place]) 11/25/2013  . SBE (subacute bacterial endocarditis) prophylaxis candidate 11/24/2013  . Aphasia 07/17/2013  . 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 seizures, not intractable, without status epilepticus ([place]) 07/17/2013  . Subdural hemorrhage ([place]) 07/17/2013  . Pre-diabetes 03/26/2013  . Increased frequency of urination 10/31/2012  . Nocturia 10/31/2012  . History of cerebral aneurysm repair 10/18/2012  . History of stroke without residual deficits 10/18/2012  . Blepharitis of both eyes 09/27/2012  . 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 Seizure disorder ([place]) 07/23/2012  . Entropion 07/10/2012  . Hyperopia with astigmatism 07/10/2012  . Combined senile cataract 04/13/2012  . Presbyopia 04/13/2012  . 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 artery ([place]) 03/01/2012  . Cognitive safety issue 02/20/2012  . Nonruptured cerebral aneurysm 11/23/2011  . Obstructive sleep apnea syndrome 08/09/2011  . Hyperlipidemia 02/03/2009  .  HTN (hypertension) 02/03/2009  . First degree atrioventricular block 02/03/2009  . OSTEOARTHRITIS 02/03/2009  . REGIONAL ENTERITIS, LARGE INTESTINE 04/17/2008    Current Outpatient Prescriptions on File Prior to Visit  Medication Sig Dispense Refill  . blood glucose meter kit  and supplies KIT Dispense based on patient and insurance preference. Use up to four times daily as directed. (prediabetes hypoglycemia). 1 each 0  . cephALEXin (KEFLEX) 500 MG capsule Take 1 capsule (500 mg total) by mouth 3 (three) times daily. 21 capsule 0  . cetirizine (ZYRTEC) 10 MG tablet Take 1 tablet (10 mg total) by mouth daily. 30 tablet 11  . Lacosamide 100 MG TABS Take 100 mg by mouth 2 (two) times daily.    Marland Kitchen losartan (COZAAR) 25 MG tablet Take 1 tablet (25 mg total) by mouth daily. (Patient taking differently: Take 25 mg by mouth 2 (two) times daily. ) 90 tablet 3  . mirabegron ER (MYRBETRIQ) 25 MG TB24 tablet Take 25 mg by mouth every evening.     . Multiple Vitamins-Minerals (MULTIVITAMIN) tablet Take 1 tablet by mouth daily.    . NON FORMULARY CPAP Machine: authorized by [facility] in [place]    . pyridOXINE (VITAMIN B-6) 50 MG tablet 100 mg.   11  . tacrolimus (PROTOPIC) 0.03 % ointment Apply topically 2 (two) times daily. To face 100 g 0  . Tamsulosin HCl (FLOMAX) 0.4 MG CAPS Take 0.8 mg by mouth at bedtime.     . thiamine 100 MG tablet Take 100 mg by mouth daily.     No current facility-administered medications on file prior to visit.     Past Medical History:  Diagnosis Date  . Aortic insufficiency   . Cerebral aneurysm    a. s/p clipping at [facility] in 8/13 c/b short-term memory loss, cerebral hemorrhage and seizure disorder s/p VP shunt.  . Cerebral hemorrhage ([place])   . Chronic diastolic CHF (congestive heart failure) ([place])   . CVA (cerebral infarction)   . Diverticulosis   . Enteritis (regional)    Dr Olevia Perches  . First degree AV block   . GI bleed   . HTN (hypertension)   . Hyperlipidemia   . Hypertensive heart disease   . IBS (irritable bowel syndrome)   . Orthostatic hypotension   . Pre-diabetes   . 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 Rheumatoid arthritis(714.0)    dxed in [employer] in 1980s  . Seizures ([place])   . Sinus bradycardia   . Sleep apnea       Past Surgical History:  Procedure Laterality Date  . CHOLECYSTECTOMY N/A 01/07/2016   Procedure: LAPAROSCOPIC CHOLECYSTECTOMY ;  Surgeon: Coralie Keens, MD;  Location: [place];  Service: General;  Laterality: N/A;  . cns shunt  02/23/12  . COLONOSCOPY W/ POLYPECTOMY  1978   negative 2009, Dr Olevia Perches. Due 2014  . CRANIOTOMY  02/02/12   Dr Harvel Ricks, [place] of aneurysm  . 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 HERNIA REPAIR    . LEFT HEART CATHETERIZATION WITH CORONARY ANGIOGRAM N/A 11/26/2013   Procedure: LEFT HEART CATHETERIZATION WITH CORONARY ANGIOGRAM;  Surgeon: [name], MD;  Location: [facility] CATH LAB;  Service: Cardiovascular;  Laterality: N/A;  . NOSE SURGERY    . SHOULDER SURGERY  1997  . TONSILLECTOMY AND ADENOIDECTOMY    . VENTRICULOPERITONEAL SHUNT  02-23-12   INSERTION OF RIGHT FRONTAL VENTRICULOPERITONEAL SHUNT WITH A CODMAN  HAKIM PROGRAMMABLE VALVE    Social History   Social History  . Marital status: Married    Spouse name: N/A  . Number of children: 1  . Years of education: N/A   Occupational History  . retired    Social History Main Topics  . Smoking status: Former Smoker    Quit date: 07/04/2009  . Smokeless tobacco: Never Used  . Alcohol use 0.0 oz/week     Comment: Very Infrequently   . Drug use: No  . Sexual activity: Not Asked   Other Topics Concern  . None   Social History Narrative  . None    Family History  Problem Relation Age of Onset  . COPD Father   . Coronary artery disease Father        MI in 59s  . Hepatitis Mother   . Diabetes Sister   . Dementia Sister 64  . Diabetes Brother   . Colon cancer Neg Hx     Review of Systems  Constitutional: Negative for chills and fever.  Respiratory: Positive for cough and shortness of breath (mild, chronic). Negative for wheezing.   Cardiovascular: Negative for chest pain, palpitations and leg swelling.   Neurological: Positive for dizziness and headaches (occ, relieved by tylenol).       Objective:   Vitals:   01/10/17 1301  BP: (!) 142/76  Pulse: 83  Resp: 16  Temp: 98.7 F (37.1 C)   Filed Weights   01/10/17 1301  Weight: 259 lb (117.5 kg)   Body mass index is 39.38 kg/m.  Wt Readings from Last 3 Encounters:  01/10/17 259 lb (117.5 kg)  01/03/17 260 lb (117.9 kg)  10/06/16 253 lb (114.8 kg)     Physical Exam  Constitutional: He appears well-developed and well-nourished. No distress.  Cardiovascular: Normal rate, regular rhythm and normal heart sounds.   Pulmonary/Chest: Effort normal and breath sounds normal. No respiratory distress. He has no wheezes. He has no rales.  Musculoskeletal:  Right forearm with trace swelling in forearm and hand, normal sensation, firm superficial vein  Skin: He is not diaphoretic. There is erythema (mild in right forearm, slight warmth).          Assessment & Plan:   See Problem List for Assessment and Plan of chronic medical problems.

## 2017-01-09 MED FILL — LOSARTAN POTASSIUM 25 MG TA: 25 | 90 days supply | Qty: 90 | Fill #1

## 2017-01-10 ENCOUNTER — Ambulatory Visit (INDEPENDENT_AMBULATORY_CARE_PROVIDER_SITE_OTHER): Payer: Medicare Other | Admitting: Internal Medicine

## 2017-01-10 ENCOUNTER — Inpatient Hospital Stay: Payer: Medicare Other | Admitting: Internal Medicine

## 2017-01-10 ENCOUNTER — Encounter: Payer: Self-pay | Admitting: Internal Medicine

## 2017-01-10 VITALS — BP 142/76 | HR 83 | Temp 98.7°F | Resp 16 | Wt 259.0 lb

## 2017-01-10 DIAGNOSIS — I808 Phlebitis and thrombophlebitis of other sites: Secondary | ICD-10-CM

## 2017-01-10 DIAGNOSIS — I10 Essential (primary) hypertension: Secondary | ICD-10-CM

## 2017-01-10 DIAGNOSIS — L03113 Cellulitis of right upper limb: Secondary | ICD-10-CM

## 2017-01-10 DIAGNOSIS — G40909 Epilepsy, unspecified, not intractable, without status epilepticus: Secondary | ICD-10-CM | POA: Diagnosis not present

## 2017-01-10 MED ORDER — AMLODIPINE BESYLATE 2.5 MG PO TABS: 2.5000 mg | ORAL_TABLET | Freq: Every day | ORAL | 3 refills | Status: DC

## 2017-01-10 MED ORDER — CEPHALEXIN 500 MG PO CAPS: 500.0000 mg | ORAL_CAPSULE | Freq: Three times a day (TID) | ORAL | 0 refills | Status: AC

## 2017-01-10 MED FILL — CEPHALEXIN 500 MG CAPSULE: 500 | 3 days supply | Qty: 9 | Fill #0

## 2017-01-10 MED FILL — AMLODIPINE 2.5 MG TABLET: 2.5 | 90 days supply | Qty: 90 | Fill #0

## 2017-01-10 NOTE — Assessment & Plan Note (Signed)
Improved but still a little elevated Will start amlodipine 2.5 mg daily  Continue losartan 25 mg twice daily Monitor at home  - call if not at goal less than 140/90 Follow up in 3 months sooner if needed

## 2017-01-10 NOTE — Assessment & Plan Note (Signed)
Improved but still some redness and mild warmth Continue keflex for an additional 3 days - total of 10 days Monitor for infection getting worse / not completely resolving

## 2017-01-10 NOTE — Assessment & Plan Note (Signed)
Following with neurology - management per neurology

## 2017-01-10 NOTE — Assessment & Plan Note (Signed)
Unable to take nsaids  Has been applying heat The clot is likely still present and may not dissolve since he can not take nsaids Discussed vascular evaluation, but residual symptoms are mild and he would like to just give it more time

## 2017-01-19 DIAGNOSIS — R569 Unspecified convulsions: Secondary | ICD-10-CM | POA: Diagnosis not present

## 2017-01-19 DIAGNOSIS — Z9889 Other specified postprocedural states: Secondary | ICD-10-CM | POA: Diagnosis not present

## 2017-01-19 DIAGNOSIS — Z4541 Encounter for adjustment and management of cerebrospinal fluid drainage device: Secondary | ICD-10-CM | POA: Diagnosis not present

## 2017-01-23 ENCOUNTER — Ambulatory Visit: Payer: Medicare Other | Admitting: Internal Medicine

## 2017-01-25 DIAGNOSIS — L03032 Cellulitis of left toe: Secondary | ICD-10-CM | POA: Diagnosis not present

## 2017-01-25 MED FILL — DOXYCYCLINE HYCLATE 100 MG: 100 | 10 days supply | Qty: 20 | Fill #0

## 2017-01-28 NOTE — Patient Instructions (Addendum)
  Test(s) ordered today. Your results will be released to [place] (or called to you) after review, usually within 72hours after test completion. If any changes need to be made, you will be notified at that same time.   Medications reviewed and updated.  No changes recommended at this time.  Your prescription(s) have been submitted to your pharmacy. Please take as directed and contact our office if you believe you are having problem(s) with the medication(s).   Please followup in 4 months

## 2017-01-28 NOTE — Progress Notes (Signed)
Subjective:    Patient ID: Jeff Weaver, male    DOB: [birth year], 71 y.o.   MRN: [id number]  HPI The patient is here for follow up.  Hypertension: He is taking his medication daily. He is not always compliant with a low sodium diet.  He denies chest pain, palpitations, and lightheadedness. He is not exercising regularly.  He does monitor his blood pressure at home - but has not been doing it regularly.  It is still variable - he has not started the amlodipine yet.    Seizure disorder:  He is following with neurology.   Prediabetes:  He is not always compliant with a low sugar/carbohydrate diet.  He is not exercising regularly.  Osteoporosis:  He had a dexa scan in 10/2016 and he has osteoporosis. He is taking a MVI daily.    B12 def:  He is taking a MVI and b12 daily.   Toe nail infection - he saw podiatry last week and was placed on doxycycline.  Had a tooth extracted today and was placed on amoxicillin.  He is not sure if he will have an implant or not.  Medications and allergies reviewed with patient and updated if appropriate.  Patient Active Problem List   Diagnosis Date Noted  . Osteoporosis 01/30/2017  . Cellulitis 01/08/2017  . Superficial thrombophlebitis 01/08/2017  . Cough 10/05/2016  . SOB (shortness of breath) 10/05/2016  . Acute bronchitis 09/26/2016  . Seborrheic dermatitis 07/25/2016  . Cognitive communication deficit 06/28/2016  . Hypertensive heart disease 06/28/2016  . Vitamin B 12 deficiency 03/16/2016  . Memory difficulties 03/13/2016  . Confusion 03/13/2016  . 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 Orthostatic hypotension 01/05/2016  . Leukemoid reaction 12/07/2014  . Benign prostatic hyperplasia with urinary obstruction 08/04/2014  . Urinary urgency 08/04/2014  . Gastroesophageal reflux disease 07/14/2014  . Polymyalgia rheumatica ([place]) 05/28/2014  . 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 Acute non-ST-elevation myocardial infarction ([place])  11/25/2013  . SBE (subacute bacterial endocarditis) prophylaxis candidate 11/24/2013  . Aphasia 07/17/2013  . 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 seizures, not intractable, without status epilepticus ([place]) 07/17/2013  . Subdural hemorrhage ([place]) 07/17/2013  . Pre-diabetes 03/26/2013  . Increased frequency of urination 10/31/2012  . Nocturia 10/31/2012  . History of cerebral aneurysm repair 10/18/2012  . History of stroke without residual deficits 10/18/2012  . Blepharitis of both eyes 09/27/2012  . 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 Seizure disorder ([place]) 07/23/2012  . Entropion 07/10/2012  . Hyperopia with astigmatism 07/10/2012  . Combined senile cataract 04/13/2012  . Presbyopia 04/13/2012  . 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 Cognitive safety issue 02/20/2012  . Nonruptured cerebral aneurysm 11/23/2011  . Obstructive sleep apnea syndrome 08/09/2011  . Hyperlipidemia 02/03/2009  . HTN (hypertension) 02/03/2009  . First degree atrioventricular block 02/03/2009  . OSTEOARTHRITIS 02/03/2009  . REGIONAL ENTERITIS, LARGE INTESTINE 04/17/2008    Current Outpatient Prescriptions on File Prior to Visit  Medication Sig Dispense Refill  . amLODipine (NORVASC) 2.5 MG tablet Take 1 tablet (2.5 mg total) by mouth daily. 90 tablet 3  . blood glucose meter kit and supplies KIT Dispense based on patient and insurance preference. Use up to four times daily as directed. (prediabetes hypoglycemia). 1 each 0  . cetirizine (ZYRTEC) 10 MG tablet Take 1 tablet (10 mg total) by mouth daily. 30 tablet 11  . Lacosamide 100 MG TABS Take 200 mg by mouth 2 (  two) times daily.     Marland Kitchen levETIRAcetam (KEPPRA) 750 MG tablet Take 750 mg by mouth 2 (two) times daily.    . mirabegron ER (MYRBETRIQ) 25 MG TB24 tablet Take 25 mg by mouth every evening.     . Multiple Vitamins-Minerals (MULTIVITAMIN) tablet Take 1 tablet by mouth  daily.    . NON FORMULARY CPAP Machine: authorized by [facility] in [place]    . phenytoin (DILANTIN) 100 MG ER capsule Take 100 mg by mouth 2 (two) times daily.    Marland Kitchen pyridOXINE (VITAMIN B-6) 50 MG tablet 100 mg.   11  . tacrolimus (PROTOPIC) 0.03 % ointment Apply topically 2 (two) times daily. To face 100 g 0  . Tamsulosin HCl (FLOMAX) 0.4 MG CAPS Take 0.8 mg by mouth at bedtime.     . thiamine 100 MG tablet Take 100 mg by mouth daily.    . vitamin B-12 (CYANOCOBALAMIN) 1000 MCG tablet Take 1,000 mcg by mouth daily.     No current facility-administered medications on file prior to visit.     Past Medical History:  Diagnosis Date  . Aortic insufficiency   . Cerebral aneurysm    a. s/p clipping at [facility] in 8/13 c/b short-term memory loss, cerebral hemorrhage and seizure disorder s/p VP shunt.  . Cerebral hemorrhage ([place])   . Chronic diastolic CHF (congestive heart failure) ([place])   . CVA (cerebral infarction)   . Diverticulosis   . Enteritis (regional)    Dr Olevia Perches  . First degree AV block   . GI bleed   . HTN (hypertension)   . Hyperlipidemia   . Hypertensive heart disease   . IBS (irritable bowel syndrome)   . Orthostatic hypotension   . Pre-diabetes   . Regional enteritis of large intestine ([place]) since 1978  . Rheumatoid arthritis(714.0)    dxed in [employer] in 1980s  . Seizures ([place])   . Sinus bradycardia   . Sleep apnea     Past Surgical History:  Procedure Laterality Date  . CHOLECYSTECTOMY N/A 01/07/2016   Procedure: LAPAROSCOPIC CHOLECYSTECTOMY ;  Surgeon: Coralie Keens, MD;  Location: [place];  Service: General;  Laterality: N/A;  . cns shunt  02/23/12  . COLONOSCOPY W/ POLYPECTOMY  1978   negative 2009, Dr Olevia Perches. Due 2014  . CRANIOTOMY  02/02/12   Dr Harvel Ricks, [place] of aneurysm  . 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 HERNIA REPAIR    . LEFT HEART CATHETERIZATION WITH CORONARY ANGIOGRAM N/A  11/26/2013   Procedure: LEFT HEART CATHETERIZATION WITH CORONARY ANGIOGRAM;  Surgeon: [name], MD;  Location: [facility] CATH LAB;  Service: Cardiovascular;  Laterality: N/A;  . NOSE SURGERY    . SHOULDER SURGERY  1997  . TONSILLECTOMY AND ADENOIDECTOMY    . VENTRICULOPERITONEAL SHUNT  02-23-12   INSERTION OF RIGHT FRONTAL VENTRICULOPERITONEAL SHUNT WITH A CODMAN HAKIM PROGRAMMABLE VALVE    Social History   Social History  . Marital status: Married    Spouse name: N/A  . Number of children: 1  . Years of education: N/A   Occupational History  . retired    Social History Main Topics  . Smoking status: Former Smoker    Quit date: 07/04/2009  . Smokeless tobacco: Never Used  . Alcohol use 0.0 oz/week     Comment: Very Infrequently   . Drug use: No  . Sexual activity: Not Asked   Other Topics Concern  .  None   Social History Narrative  . None    Family History  Problem Relation Age of Onset  . COPD Father   . Coronary artery disease Father        MI in 9s  . Hepatitis Mother   . Diabetes Sister   . Dementia Sister 85  . Diabetes Brother   . Colon cancer Neg Hx     Review of Systems  Constitutional: Negative for fever.  HENT: Positive for sneezing.   Respiratory: Positive for cough and shortness of breath (stairs, moderate exertion). Negative for wheezing.   Cardiovascular: Positive for leg swelling (right arm and leg). Negative for chest pain and palpitations.  Neurological: Positive for headaches (mild, intermittent).       Objective:   Vitals:   01/30/17 1117  BP: 138/84  Pulse: 64  Resp: 16  Temp: 97.9 F (36.6 C)   Wt Readings from Last 3 Encounters:  01/30/17 262 lb (118.8 kg)  01/10/17 259 lb (117.5 kg)  01/03/17 260 lb (117.9 kg)   Body mass index is 39.84 kg/m.   Physical Exam    Constitutional: Appears well-developed and well-nourished. No distress.  HENT:  Head: Normocephalic and atraumatic.  Neck: Neck supple. No tracheal deviation  present. No thyromegaly present.  No cervical lymphadenopathy Cardiovascular: Normal rate, regular rhythm and normal heart sounds.   No murmur heard. No carotid bruit .  1+ b/l LE edema Pulmonary/Chest: Effort normal and breath sounds normal. No respiratory distress. No has no wheezes. No rales.  Skin: Skin is warm and dry. Not diaphoretic.  Psychiatric: Normal mood and affect. Behavior is normal.      Assessment & Plan:    See Problem List for Assessment and Plan of chronic medical problems.

## 2017-01-30 ENCOUNTER — Encounter: Payer: Self-pay | Admitting: Internal Medicine

## 2017-01-30 ENCOUNTER — Other Ambulatory Visit (INDEPENDENT_AMBULATORY_CARE_PROVIDER_SITE_OTHER): Payer: Medicare Other

## 2017-01-30 ENCOUNTER — Ambulatory Visit: Payer: Medicare Other | Admitting: Internal Medicine

## 2017-01-30 ENCOUNTER — Ambulatory Visit (INDEPENDENT_AMBULATORY_CARE_PROVIDER_SITE_OTHER): Payer: Medicare Other | Admitting: Internal Medicine

## 2017-01-30 VITALS — BP 138/84 | HR 64 | Temp 97.9°F | Resp 16 | Wt 262.0 lb

## 2017-01-30 DIAGNOSIS — M810 Age-related osteoporosis without current pathological fracture: Secondary | ICD-10-CM

## 2017-01-30 DIAGNOSIS — E538 Deficiency of other specified B group vitamins: Secondary | ICD-10-CM

## 2017-01-30 DIAGNOSIS — I10 Essential (primary) hypertension: Secondary | ICD-10-CM

## 2017-01-30 DIAGNOSIS — R7303 Prediabetes: Secondary | ICD-10-CM

## 2017-01-30 DIAGNOSIS — G4733 Obstructive sleep apnea (adult) (pediatric): Secondary | ICD-10-CM

## 2017-01-30 LAB — LIPID PANEL
Cholesterol: 186 mg/dL (ref 0–200)
HDL: 44.7 mg/dL (ref >39.00)
LDL Cholesterol: 108 mg/dL — ABNORMAL HIGH (ref 0–99)
NonHDL: 141.74
Total CHOL/HDL Ratio: 4
Triglycerides: 170 mg/dL — ABNORMAL HIGH (ref 0.0–149.0)
VLDL: 34 mg/dL (ref 0.0–40.0)

## 2017-01-30 LAB — COMPREHENSIVE METABOLIC PANEL
ALT: 43 U/L (ref 0–53)
AST: 21 U/L (ref 0–37)
Albumin: 4 g/dL (ref 3.5–5.2)
Alkaline Phosphatase: 46 U/L (ref 39–117)
BUN: 15 mg/dL (ref 6–23)
CO2: 26 mEq/L (ref 19–32)
Calcium: 9.5 mg/dL (ref 8.4–10.5)
Chloride: 100 mEq/L (ref 96–112)
Creatinine, Ser: 0.92 mg/dL (ref 0.40–1.50)
GFR: 85.78 mL/min (ref >60.00)
Glucose, Bld: 122 mg/dL — ABNORMAL HIGH (ref 70–99)
Potassium: 4.2 mEq/L (ref 3.5–5.1)
Sodium: 134 mEq/L — ABNORMAL LOW (ref 135–145)
Total Bilirubin: 0.4 mg/dL (ref 0.2–1.2)
Total Protein: 6.9 g/dL (ref 6.0–8.3)

## 2017-01-30 LAB — VITAMIN D 25 HYDROXY (VIT D DEFICIENCY, FRACTURES): VITD: 21.47 ng/mL — ABNORMAL LOW (ref 30.00–100.00)

## 2017-01-30 LAB — TSH: TSH: 1.46 u[IU]/mL (ref 0.35–4.50)

## 2017-01-30 LAB — HEMOGLOBIN A1C: Hgb A1c MFr Bld: 6 % (ref 4.6–6.5)

## 2017-01-30 MED ORDER — LOSARTAN POTASSIUM 25 MG PO TABS: 25.0000 mg | ORAL_TABLET | Freq: Two times a day (BID) | ORAL | 3 refills | Status: DC

## 2017-01-30 MED FILL — CHLORHEXIDINE 0.12% RINSE: 0.12 | 16 days supply | Qty: 473 | Fill #0

## 2017-01-30 MED FILL — AMOXICILLIN 875 MG TABLET: 875 | 10 days supply | Qty: 20 | Fill #0

## 2017-01-30 MED FILL — ACETAMINOPHEN/COD #3 TABLET: 300-30 | 2 days supply | Qty: 16 | Fill #0

## 2017-01-30 NOTE — Assessment & Plan Note (Signed)
BP Readings from Last 3 Encounters:  01/30/17 138/84  01/10/17 (!) 142/76  01/03/17 (!) 159/87   BP variable - started him on amlodipine 2.5 mg at his last visit but he has not started it yet discussed BP goal Start walking Weight loss would be ideal

## 2017-01-30 NOTE — Assessment & Plan Note (Signed)
Will check with neuro and dentist regarding any contraindications to medications for OP Encouraged regular walking Check vitamin D level Discussed options briefly

## 2017-01-30 NOTE — Assessment & Plan Note (Addendum)
Uses cpap but not consistently - his tube does become disconnected frequently - [facility] with [facility]

## 2017-01-30 NOTE — Assessment & Plan Note (Signed)
Taking B12 orally Check level

## 2017-01-30 NOTE — Assessment & Plan Note (Signed)
Check a1c Low sugar / carb diet Stressed regular exercise, weight loss  

## 2017-01-31 DIAGNOSIS — R41841 Cognitive communication deficit: Secondary | ICD-10-CM | POA: Diagnosis not present

## 2017-02-02 ENCOUNTER — Telehealth: Payer: Self-pay | Admitting: Internal Medicine

## 2017-02-02 NOTE — Telephone Encounter (Signed)
Pts wife returned your call regarding his labs. I gave her MD response. She expressed understanding and did not have any questions.

## 2017-02-03 NOTE — Telephone Encounter (Signed)
Noted  

## 2017-02-08 MED FILL — AMOXICILLIN 500 MG CAPSULE: 500 | 2 days supply | Qty: 8 | Fill #0

## 2017-02-13 DIAGNOSIS — R3915 Urgency of urination: Secondary | ICD-10-CM | POA: Diagnosis not present

## 2017-03-22 ENCOUNTER — Telehealth: Payer: Self-pay | Admitting: Emergency Medicine

## 2017-03-22 DIAGNOSIS — I5030 Unspecified diastolic (congestive) heart failure: Principal | ICD-10-CM

## 2017-03-22 DIAGNOSIS — I110 Hypertensive heart disease with heart failure: Secondary | ICD-10-CM

## 2017-03-22 DIAGNOSIS — M353 Polymyalgia rheumatica: Secondary | ICD-10-CM

## 2017-03-22 DIAGNOSIS — R410 Disorientation, unspecified: Secondary | ICD-10-CM

## 2017-03-22 MED FILL — AMOXICILLIN 500 MG CAPSULE: 500 | 2 days supply | Qty: 8 | Fill #1

## 2017-03-22 NOTE — Telephone Encounter (Signed)
Spoke with pts wife to inform.

## 2017-03-22 NOTE — Telephone Encounter (Signed)
Blood work and urine tests ordered.

## 2017-03-22 NOTE — Telephone Encounter (Signed)
Received a voicemail from patients wife, she would like to know if we can order blood work for pt to check for infection or inflammation. Pt had an episode like he does before a seizure but did not have a seizure. He was disoriented. Spoke with pts wife this morning and states he is doing better. Please advise.

## 2017-03-23 ENCOUNTER — Other Ambulatory Visit (INDEPENDENT_AMBULATORY_CARE_PROVIDER_SITE_OTHER): Payer: Medicare Other

## 2017-03-23 ENCOUNTER — Encounter: Payer: Self-pay | Admitting: Internal Medicine

## 2017-03-23 DIAGNOSIS — M353 Polymyalgia rheumatica: Secondary | ICD-10-CM | POA: Diagnosis not present

## 2017-03-23 DIAGNOSIS — I5030 Unspecified diastolic (congestive) heart failure: Secondary | ICD-10-CM | POA: Diagnosis not present

## 2017-03-23 DIAGNOSIS — I110 Hypertensive heart disease with heart failure: Secondary | ICD-10-CM

## 2017-03-23 DIAGNOSIS — R410 Disorientation, unspecified: Secondary | ICD-10-CM

## 2017-03-23 LAB — CBC WITH DIFFERENTIAL/PLATELET
Basophils Absolute: 0 10*3/uL (ref 0.0–0.1)
Basophils Relative: 0.3 % (ref 0.0–3.0)
Eosinophils Absolute: 0.1 10*3/uL (ref 0.0–0.7)
Eosinophils Relative: 1.4 % (ref 0.0–5.0)
HCT: 43.4 % (ref 39.0–52.0)
Hemoglobin: 14.6 g/dL (ref 13.0–17.0)
Lymphocytes Relative: 21.4 % (ref 12.0–46.0)
Lymphs Abs: 1.4 10*3/uL (ref 0.7–4.0)
MCHC: 33.7 g/dL (ref 30.0–36.0)
MCV: 92.3 fl (ref 78.0–100.0)
Monocytes Absolute: 0.5 10*3/uL (ref 0.1–1.0)
Monocytes Relative: 7.6 % (ref 3.0–12.0)
Neutro Abs: 4.6 10*3/uL (ref 1.4–7.7)
Neutrophils Relative %: 69.3 % (ref 43.0–77.0)
Platelets: 169 10*3/uL (ref 150.0–400.0)
RBC: 4.7 Mil/uL (ref 4.22–5.81)
RDW: 14 % (ref 11.5–15.5)
WBC: 6.6 10*3/uL (ref 4.0–10.5)

## 2017-03-23 LAB — URINALYSIS, ROUTINE W REFLEX MICROSCOPIC
Bilirubin Urine: NEGATIVE
Ketones, ur: NEGATIVE
Leukocytes, UA: NEGATIVE
Nitrite: NEGATIVE
Specific Gravity, Urine: 1.015 (ref 1.000–1.030)
Total Protein, Urine: NEGATIVE
Urine Glucose: NEGATIVE
Urobilinogen, UA: 0.2 (ref 0.0–1.0)
pH: 6 (ref 5.0–8.0)

## 2017-03-23 LAB — COMPREHENSIVE METABOLIC PANEL
ALT: 43 U/L (ref 0–53)
AST: 21 U/L (ref 0–37)
Albumin: 3.8 g/dL (ref 3.5–5.2)
Alkaline Phosphatase: 37 U/L — ABNORMAL LOW (ref 39–117)
BUN: 13 mg/dL (ref 6–23)
CO2: 29 mEq/L (ref 19–32)
Calcium: 9.2 mg/dL (ref 8.4–10.5)
Chloride: 103 mEq/L (ref 96–112)
Creatinine, Ser: 0.95 mg/dL (ref 0.40–1.50)
GFR: 82.63 mL/min (ref >60.00)
Glucose, Bld: 110 mg/dL — ABNORMAL HIGH (ref 70–99)
Potassium: 4.3 mEq/L (ref 3.5–5.1)
Sodium: 137 mEq/L (ref 135–145)
Total Bilirubin: 0.5 mg/dL (ref 0.2–1.2)
Total Protein: 6.5 g/dL (ref 6.0–8.3)

## 2017-03-23 LAB — SEDIMENTATION RATE: Sed Rate: 10 mm/hr (ref 0–20)

## 2017-03-24 LAB — URINE CULTURE
MICRO NUMBER:: 81041593
Result:: NO GROWTH
SPECIMEN QUALITY:: ADEQUATE

## 2017-04-11 DIAGNOSIS — R251 Tremor, unspecified: Secondary | ICD-10-CM | POA: Diagnosis not present

## 2017-04-11 DIAGNOSIS — I10 Essential (primary) hypertension: Secondary | ICD-10-CM | POA: Diagnosis not present

## 2017-04-11 DIAGNOSIS — G919 Hydrocephalus, unspecified: Secondary | ICD-10-CM | POA: Diagnosis not present

## 2017-04-11 DIAGNOSIS — E785 Hyperlipidemia, unspecified: Secondary | ICD-10-CM | POA: Diagnosis not present

## 2017-04-11 DIAGNOSIS — G4733 Obstructive sleep apnea (adult) (pediatric): Secondary | ICD-10-CM | POA: Diagnosis not present

## 2017-04-11 DIAGNOSIS — Z982 Presence of cerebrospinal fluid drainage device: Secondary | ICD-10-CM | POA: Diagnosis not present

## 2017-04-11 DIAGNOSIS — Z8673 Personal history of transient ischemic attack (TIA), and cerebral infarction without residual deficits: Secondary | ICD-10-CM | POA: Diagnosis not present

## 2017-04-11 DIAGNOSIS — G40109 Localization-related (focal) (partial) symptomatic epilepsy and epileptic syndromes with simple partial seizures, not intractable, without status epilepticus: Secondary | ICD-10-CM | POA: Diagnosis not present

## 2017-04-11 DIAGNOSIS — Z9889 Other specified postprocedural states: Secondary | ICD-10-CM | POA: Diagnosis not present

## 2017-04-11 DIAGNOSIS — Z8679 Personal history of other diseases of the circulatory system: Secondary | ICD-10-CM | POA: Diagnosis not present

## 2017-04-11 DIAGNOSIS — R2681 Unsteadiness on feet: Secondary | ICD-10-CM | POA: Diagnosis not present

## 2017-04-11 DIAGNOSIS — Z9989 Dependence on other enabling machines and devices: Secondary | ICD-10-CM | POA: Diagnosis not present

## 2017-04-19 NOTE — Patient Instructions (Addendum)
    Flu immunization administered today.    Medications reviewed and updated.   No changes recommended at this time.   Please followup in 6 months with me; schedule a wellness with our nurse Sharee Pimple

## 2017-04-19 NOTE — Progress Notes (Signed)
Subjective:    Patient ID: Jeff Weaver, male    DOB: January 01, 1946, 71 y.o.   MRN: [id number]  HPI The patient is here for follow up.  Hypertension: He is taking his medication daily. He is compliant with a low sodium diet.  He has some SOB with walking, which is not new.  He has some intermittent mild leg edema.  He has intermittent headaches, which are also not new.  He denies chest pain, palpitations. He is not exercising regularly, but does some walking.  He does monitor his blood pressure at home and it has been controlled.    Prediabetes:  He is fairly compliant with a low sugar/carbohydrate diet.  He is exercising, but not always regularly.  History of cva, seizure disorder, memory difficulties, poor balance:  He is following with neurology.  He is taking his medication daily as prescribed.   He walks with a cane. He denies any seizure like activity since he was here last.     Medications and allergies reviewed with patient and updated if appropriate.  Patient Active Problem List   Diagnosis Date Noted  . Osteoporosis 01/30/2017  . Cellulitis 01/08/2017  . Superficial thrombophlebitis 01/08/2017  . Cough 10/05/2016  . SOB (shortness of breath) 10/05/2016  . Seborrheic dermatitis 07/25/2016  . Cognitive communication deficit 06/28/2016  . Hypertensive heart disease 06/28/2016  . Vitamin B 12 deficiency 03/16/2016  . Memory difficulties 03/13/2016  . Confusion 03/13/2016  . 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 Orthostatic hypotension 01/05/2016  . Benign prostatic hyperplasia with urinary obstruction 08/04/2014  . Urinary urgency 08/04/2014  . Gastroesophageal reflux disease 07/14/2014  . Polymyalgia rheumatica ([place]) 05/28/2014  . 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 Acute non-ST-elevation myocardial infarction ([place]) 11/25/2013  . SBE (subacute bacterial endocarditis) prophylaxis candidate 11/24/2013  . Aphasia 07/17/2013  . Localization-related symptomatic  epilepsy and epileptic syndromes with complex partial seizures, not intractable, without status epilepticus ([place]) 07/17/2013  . Subdural hemorrhage ([place]) 07/17/2013  . Pre-diabetes 03/26/2013  . Increased frequency of urination 10/31/2012  . Nocturia 10/31/2012  . History of cerebral aneurysm repair 10/18/2012  . History of stroke without residual deficits 10/18/2012  . Blepharitis of both eyes 09/27/2012  . 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 Seizure disorder ([place]) 07/23/2012  . Entropion 07/10/2012  . Hyperopia with astigmatism 07/10/2012  . Combined senile cataract 04/13/2012  . Presbyopia 04/13/2012  . 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 Cognitive safety issue 02/20/2012  . Nonruptured cerebral aneurysm 11/23/2011  . Obstructive sleep apnea syndrome 08/09/2011  . Hyperlipidemia 02/03/2009  . HTN (hypertension) 02/03/2009  . First degree atrioventricular block 02/03/2009  . OSTEOARTHRITIS 02/03/2009  . REGIONAL ENTERITIS, LARGE INTESTINE 04/17/2008    Current Outpatient Prescriptions on File Prior to Visit  Medication Sig Dispense Refill  . blood glucose meter kit and supplies KIT Dispense based on patient and insurance preference. Use up to four times daily as directed. (prediabetes hypoglycemia). 1 each 0  . cetirizine (ZYRTEC) 10 MG tablet Take 1 tablet (10 mg total) by mouth daily. 30 tablet 11  . Lacosamide 100 MG TABS Take 200 mg by mouth 2 (two) times daily.     Marland Kitchen levETIRAcetam (KEPPRA) 750 MG tablet Take 750 mg by mouth 2 (two) times daily.    Marland Kitchen losartan (COZAAR) 25 MG tablet Take 1 tablet (25 mg total) by mouth 2 (two) times daily. 180 tablet 3  . mirabegron ER (MYRBETRIQ) 25 MG  TB24 tablet Take 25 mg by mouth every evening.     . Multiple Vitamins-Minerals (MULTIVITAMIN) tablet Take 1 tablet by mouth daily.    . NON FORMULARY CPAP Machine: authorized by [facility] in [place]    . pyridOXINE (VITAMIN B-6) 50 MG tablet 100 mg.   11  . tacrolimus  (PROTOPIC) 0.03 % ointment Apply topically 2 (two) times daily. To face 100 g 0  . Tamsulosin HCl (FLOMAX) 0.4 MG CAPS Take 0.8 mg by mouth at bedtime.     . thiamine 100 MG tablet Take 100 mg by mouth daily.    . vitamin B-12 (CYANOCOBALAMIN) 1000 MCG tablet Take 1,000 mcg by mouth daily.    Marland Kitchen amLODipine (NORVASC) 2.5 MG tablet Take 1 tablet (2.5 mg total) by mouth daily. (Patient not taking: Reported on 04/20/2017) 90 tablet 3   No current facility-administered medications on file prior to visit.     Past Medical History:  Diagnosis Date  . Aortic insufficiency   . Cerebral aneurysm    a. s/p clipping at [facility] in 8/13 c/b short-term memory loss, cerebral hemorrhage and seizure disorder s/p VP shunt.  . Cerebral hemorrhage ([place])   . Chronic diastolic CHF (congestive heart failure) ([place])   . CVA (cerebral infarction)   . Diverticulosis   . Enteritis (regional)    Dr Olevia Perches  . First degree AV block   . GI bleed   . HTN (hypertension)   . Hyperlipidemia   . Hypertensive heart disease   . IBS (irritable bowel syndrome)   . Orthostatic hypotension   . Pre-diabetes   . 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 Rheumatoid arthritis(714.0)    dxed in [employer] in 1980s  . Seizures ([place])   . Sinus bradycardia   . Sleep apnea     Past Surgical History:  Procedure Laterality Date  . CHOLECYSTECTOMY N/A 01/07/2016   Procedure: LAPAROSCOPIC CHOLECYSTECTOMY ;  Surgeon: Coralie Keens, MD;  Location: [place];  Service: General;  Laterality: N/A;  . cns shunt  02/23/12  . COLONOSCOPY W/ POLYPECTOMY  1978   negative 2009, Dr Olevia Perches. Due 2014  . CRANIOTOMY  02/02/12   Dr Harvel Ricks, [place] of aneurysm  . 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 HERNIA REPAIR    . LEFT HEART CATHETERIZATION WITH CORONARY ANGIOGRAM N/A 11/26/2013   Procedure: LEFT HEART CATHETERIZATION WITH CORONARY ANGIOGRAM;  Surgeon:  [name], MD;  Location[facility] CATH LAB;  Service: Cardiovascular;  Laterality: N/A;  . NOSE SURGERY    . SHOULDER SURGERY  1997  . TONSILLECTOMY AND ADENOIDECTOMY    . VENTRICULOPERITONEAL SHUNT  02-23-12   INSERTION OF RIGHT FRONTAL VENTRICULOPERITONEAL SHUNT WITH A CODMAN HAKIM PROGRAMMABLE VALVE    Social History   Social History  . Marital status: Married    Spouse name: N/A  . Number of children: 1  . Years of education: N/A   Occupational History  . retired    Social History Main Topics  . Smoking status: Former Smoker    Quit date: 07/04/2009  . Smokeless tobacco: Never Used  . Alcohol use 0.0 oz/week     Comment: Very Infrequently   . Drug use: No  . Sexual activity: Not Asked   Other Topics Concern  . None   Social History Narrative  . None    Family History  Problem Relation Age of Onset  . COPD Father   .  Coronary artery disease Father        MI in 3s  . Hepatitis Mother   . Diabetes Sister   . Dementia Sister 71  . Diabetes Brother   . Colon cancer Neg Hx     Review of Systems  Constitutional: Negative for chills and fever.  Respiratory: Positive for cough (early morning, late at eveing) and shortness of breath (mild). Negative for wheezing.   Cardiovascular: Positive for leg swelling (mild, chronic). Negative for chest pain and palpitations.  Neurological: Positive for light-headedness and headaches (intermittent). Negative for seizures.       Objective:   Vitals:   04/20/17 0836  BP: 112/66  Pulse: (!) 59  Resp: 16  Temp: 97.9 F (36.6 C)  SpO2: 95%   Wt Readings from Last 3 Encounters:  04/20/17 257 lb (116.6 kg)  01/30/17 262 lb (118.8 kg)  01/10/17 259 lb (117.5 kg)   Body mass index is 39.08 kg/m.   Physical Exam    Constitutional: Appears well-developed and well-nourished. No distress.  HENT:  Head: Normocephalic and atraumatic.  Neck: Neck supple. No tracheal deviation present. No thyromegaly present.  No cervical  lymphadenopathy Cardiovascular: Normal rate, regular rhythm and normal heart sounds.   No murmur heard. No carotid bruit .  No edema Pulmonary/Chest: Effort normal and breath sounds normal. No respiratory distress. No has no wheezes. No rales.  Skin: Skin is warm and dry. Not diaphoretic.  Psychiatric: Normal mood and affect. Behavior is normal.      Assessment & Plan:    See Problem List for Assessment and Plan of chronic medical problems.

## 2017-04-20 ENCOUNTER — Ambulatory Visit (INDEPENDENT_AMBULATORY_CARE_PROVIDER_SITE_OTHER): Payer: Medicare Other | Admitting: Internal Medicine

## 2017-04-20 ENCOUNTER — Encounter: Payer: Self-pay | Admitting: Internal Medicine

## 2017-04-20 VITALS — BP 112/66 | HR 59 | Temp 97.9°F | Resp 16 | Wt 257.0 lb

## 2017-04-20 DIAGNOSIS — R7303 Prediabetes: Secondary | ICD-10-CM

## 2017-04-20 DIAGNOSIS — Z23 Encounter for immunization: Secondary | ICD-10-CM | POA: Diagnosis not present

## 2017-04-20 DIAGNOSIS — I10 Essential (primary) hypertension: Secondary | ICD-10-CM | POA: Diagnosis not present

## 2017-04-20 DIAGNOSIS — G40909 Epilepsy, unspecified, not intractable, without status epilepticus: Secondary | ICD-10-CM | POA: Diagnosis not present

## 2017-04-20 NOTE — Assessment & Plan Note (Signed)
Lab Results  Component Value Date   HGBA1C 6.0 01/30/2017    Sugars have been well-controlled Encouraged regular exercise and diabetic diet Work on weight loss We will recheck A1c at his next visit

## 2017-04-20 NOTE — Assessment & Plan Note (Signed)
Blood pressure well-controlled here and on average has been well-controlled Continue current medication at current dose He will continue to monitor at home

## 2017-04-20 NOTE — Assessment & Plan Note (Signed)
Following with neurology and visits up-to-date No recent seizure-like activity

## 2017-04-25 DIAGNOSIS — R2681 Unsteadiness on feet: Secondary | ICD-10-CM | POA: Diagnosis not present

## 2017-04-25 DIAGNOSIS — R6889 Other general symptoms and signs: Secondary | ICD-10-CM | POA: Diagnosis not present

## 2017-04-25 DIAGNOSIS — R4189 Other symptoms and signs involving cognitive functions and awareness: Secondary | ICD-10-CM | POA: Diagnosis not present

## 2017-04-25 DIAGNOSIS — R5381 Other malaise: Secondary | ICD-10-CM | POA: Diagnosis not present

## 2017-04-25 DIAGNOSIS — R270 Ataxia, unspecified: Secondary | ICD-10-CM | POA: Diagnosis not present

## 2017-04-27 NOTE — Progress Notes (Signed)
Pre visit review using our clinic review tool, if applicable. No additional management support is needed unless otherwise documented below in the visit note. 

## 2017-04-27 NOTE — Progress Notes (Addendum)
Subjective:   Jeff Weaver is a 71 y.o. male who presents for an Initial Medicare Annual Wellness Visit.  Review of Systems  No ROS.  Medicare Wellness Visit. Additional risk factors are reflected in the social history.  Cardiac Risk Factors include: advanced age (>72mn, >>67women);dyslipidemia;male gender;obesity (BMI >30kg/m2)  Sleep patterns: feels rested on waking, gets up 1-2 times nightly to void and sleeps 7-8 hours nightly.  Patient states he sleeps well and feels rested, uses C-PAP  Home Safety/Smoke Alarms: Feels safe in home. Smoke alarms in place.  Living environment; residence and Firearm Safety: 2-story house, no firearms. Lives with wife, no needs for DME, good support system Seat Belt Safety/Bike Helmet: Wears seat belt.    Objective:    Today's Vitals   04/28/17 0925  BP: 124/78  Pulse: (!) 56  Resp: 20  SpO2: 98%  Weight: 260 lb (117.9 kg)  Height: 5' 8"  (1.727 m)   Body mass index is 39.53 kg/m.  Current Medications (verified) Outpatient Encounter Prescriptions as of 04/28/2017  Medication Sig  . acetaminophen (TYLENOL) 650 MG CR tablet Take 650 mg by mouth every 8 (eight) hours as needed for pain.  . blood glucose meter kit and supplies KIT Dispense based on patient and insurance preference. Use up to four times daily as directed. (prediabetes hypoglycemia).  . cetirizine (ZYRTEC) 10 MG tablet Take 1 tablet (10 mg total) by mouth daily.  . Cholecalciferol (VITAMIN D PO) Take 1,000 mg by mouth every morning.  . Lacosamide 100 MG TABS Take 200 mg by mouth 2 (two) times daily.   .Marland KitchenlevETIRAcetam (KEPPRA) 750 MG tablet Take 750 mg by mouth 2 (two) times daily.  .Marland Kitchenlosartan (COZAAR) 25 MG tablet Take 1 tablet (25 mg total) by mouth 2 (two) times daily.  . mirabegron ER (MYRBETRIQ) 25 MG TB24 tablet Take 25 mg by mouth every evening.   . Multiple Vitamins-Minerals (MULTIVITAMIN) tablet Take 1 tablet by mouth daily.  . NON FORMULARY CPAP Machine[facility] [place] . pyridOXINE (VITAMIN B-6) 50 MG tablet 100 mg.   . Tamsulosin HCl (FLOMAX) 0.4 MG CAPS Take 0.8 mg by mouth at bedtime.   . thiamine 100 MG tablet Take 100 mg by mouth daily.  . vitamin B-12 (CYANOCOBALAMIN) 1000 MCG tablet Take 1,000 mcg by mouth daily.  . [DISCONTINUED] tacrolimus (PROTOPIC) 0.03 % ointment Apply topically 2 (two) times daily. To face (Patient not taking: Reported on 04/28/2017)   No facility-administered encounter medications on file as of 04/28/2017.     Allergies (verified) Crestor [rosuvastatin]; Haloperidol; Simvastatin; Lisinopril; and Pravastatin   History: Past Medical History:  Diagnosis Date  . Aortic insufficiency   . Cerebral aneurysm    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  . [place]   . Chronic diastolic CHF (congestive heart failure) ([place]   . CVA (cerebral infarction)   . Diverticulosis   . Enteritis (regional)    Dr BOlevia Perches . First degree AV block   . GI bleed   . HTN (hypertension)   . Hyperlipidemia   . Hypertensive heart disease   . IBS (irritable bowel syndrome)   . Orthostatic hypotension   . Pre-diabetes   . [place] enteritis of large intestine ([place] since 1978  . Rheumatoid arthritis(714.0)    dxed in [employer] in 1980s  . Seizures ([place]   . Sinus bradycardia   .  Sleep apnea    Past Surgical History:  Procedure Laterality Date  . CHOLECYSTECTOMY N/A 01/07/2016   Procedure: LAPAROSCOPIC CHOLECYSTECTOMY ;  Surgeon: Coralie Keens, MD;  Location: [place];  Service: General;  Laterality: N/A;  . cns shunt  02/23/12  . COLONOSCOPY W/ POLYPECTOMY  1978   negative 2009, Dr Olevia Perches. Due 2014  . CRANIOTOMY  02/02/12   Dr Harvel Ricks, [place] of aneurysm  . 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 HERNIA REPAIR    . LEFT HEART CATHETERIZATION WITH CORONARY  ANGIOGRAM N/A 11/26/2013   Procedure: LEFT HEART CATHETERIZATION WITH CORONARY ANGIOGRAM;  Surgeon: [name], MD;  Location: [facility] CATH LAB;  Service: Cardiovascular;  Laterality: N/A;  . NOSE SURGERY    . SHOULDER SURGERY  1997  . TONSILLECTOMY AND ADENOIDECTOMY    . VENTRICULOPERITONEAL SHUNT  02-23-12   INSERTION OF RIGHT FRONTAL VENTRICULOPERITONEAL SHUNT WITH A CODMAN HAKIM PROGRAMMABLE VALVE   Family History  Problem Relation Age of Onset  . COPD Father   . Coronary artery disease Father        MI in 73s  . Hepatitis Mother   . Diabetes Sister   . Dementia Sister 57  . Diabetes Brother   . Colon cancer Neg Hx    Social History   Occupational History  . retired    Social History Main Topics  . Smoking status: Former Smoker    Types: Pipe    Quit date: 07/04/2009  . Smokeless tobacco: Former Systems developer  . Alcohol use 0.0 oz/week     Comment: Very Infrequently   . Drug use: No  . Sexual activity: Not on file   Tobacco Counseling Counseling given: Not Answered   Activities of Daily Living In your present state of health, do you have any difficulty performing the following activities: 04/28/2017  Hearing? Y  Vision? N  Difficulty concentrating or making decisions? Y  Walking or climbing stairs? N  Dressing or bathing? N  Doing errands, shopping? Y  Preparing Food and eating ? Y  Using the Toilet? N  In the past six months, have you accidently leaked urine? N  Do you have problems with loss of bowel control? N  Managing your Medications? N  Managing your Finances? N  Housekeeping or managing your Housekeeping? Y  Some recent data might be hidden    Immunizations and Health Maintenance Immunization History  Administered Date(s) Administered  . Influenza Split 04/10/2012  . Influenza Whole 05/05/2011, 04/10/2012  . Influenza, High Dose Seasonal PF 04/04/2013, 03/22/2014, 05/01/2015, 03/16/2016, 04/20/2017  . Pneumococcal Conjugate-13 08/03/2009, 08/04/2011  .  Pneumococcal Polysaccharide-23 07/25/2016  . Tdap 07/25/2011  . Zoster 06/06/2010   There are no preventive care reminders to display for this patient.  Patient Care Team: [name], MD as PCP - General (Internal Medicine) Windle Guard, Carney Harder (Neurology) Inocente Salles Aline August, MD as Referring Physician (Neurology) Burman Foster, MD as Physician Assistant (Urology) Florina Ou, CCC-SLP as Speech Language Pathologist (Speech Pathology)  Indicate any recent Medical Services you may have received from other than [facility] providers in the past year (date may be approximate).    Assessment:   This is a routine wellness examination for Quandre. Physical assessment deferred to PCP.   Hearing/Vision screen Hearing Screening Comments: HOH, has hearing aids through [facility] Comments: appointment yearly Dr. Cornell Barman  Dietary issues and exercise activities discussed: Current Exercise Habits: Home  exercise routine (provided chair exercise pamphlets), Type of exercise: walking, Time (Minutes): 45, Frequency (Times/Week): 3, Weekly Exercise (Minutes/Week): 135, Intensity: Mild, Exercise limited by: orthopedic condition(s)  Diet (meal preparation, eat out, water intake, caffeinated beverages, dairy products, fruits and vegetables): in general, an "unhealthy" diet   Reviewed heart healthy diet, reading food labels, carbohydrate counting, portion control and weight loss strategies, encouraged patient to increase daily water intake. Diet education was attached to patient's AVS. Relevant patient education assigned to patient using Emmi.    Goals    . Be as active and as independent as possible          Get better organized by continuing to use my index cards, increased activity by walking and washing my car, monitor diet closer.      Depression Screen PHQ 2/9 Scores 04/28/2017 04/20/2017 03/16/2016 11/13/2014  PHQ - 2 Score 3 1 0 1  PHQ- 9 Score 6 - - -    Fall Risk Fall Risk  04/28/2017  04/20/2017 05/10/2016 03/16/2016 02/26/2016  Falls in the past year? No No No No No  Comment - - - - Emmi Telephone Survey: data to providers prior to load  Risk for fall due to : Impaired balance/gait - - - -    Cognitive Function: MMSE - Mini Mental State Exam 04/28/2017  Orientation to time 4  Orientation to Place 4  Registration 3  Attention/ Calculation 4  Recall 1  Language- name 2 objects 2  Language- repeat 1  Language- follow 3 step command 2  Language- read & follow direction 1  Write a sentence 1  Copy design 1  Total score 24        Screening Tests Health Maintenance  Topic Date Due  . COLONOSCOPY  04/10/2018  . DEXA SCAN  10/22/2018  . TETANUS/TDAP  07/24/2021  . INFLUENZA VACCINE  Completed  . Hepatitis C Screening  Completed  . PNA vac Low Risk Adult  Completed        Plan:    Continue doing brain stimulating activities (puzzles, reading, adult coloring books, staying active) to keep memory sharp.   Start to eat heart healthy diet (full of fruits, vegetables, whole grains, lean protein, water--limit salt, fat, and sugar intake) and increase physical activity as tolerated.  I have personally reviewed and noted the following in the patient's chart:   . Medical and social history . Use of alcohol, tobacco or illicit drugs  . Current medications and supplements . Functional ability and status . Nutritional status . Physical activity . Advanced directives . List of other physicians . Vitals . Screenings to include cognitive, depression, and falls . Referrals and appointments  In addition, I have reviewed and discussed with patient certain preventive protocols, quality metrics, and best practice recommendations. A written personalized care plan for preventive services as well as general preventive health recommendations were provided to patient.     Michiel Cowboy, RN   04/28/2017    Medical screening examination/treatment/procedure(s) were performed  by non-physician practitioner and as supervising physician I was immediately available for consultation/collaboration. I agree with above. [name], MD

## 2017-04-28 ENCOUNTER — Telehealth: Payer: Self-pay | Admitting: *Deleted

## 2017-04-28 ENCOUNTER — Ambulatory Visit (INDEPENDENT_AMBULATORY_CARE_PROVIDER_SITE_OTHER): Payer: Medicare Other | Admitting: *Deleted

## 2017-04-28 VITALS — BP 124/78 | HR 56 | Resp 20 | Ht 68.0 in | Wt 260.0 lb

## 2017-04-28 DIAGNOSIS — J329 Chronic sinusitis, unspecified: Secondary | ICD-10-CM

## 2017-04-28 DIAGNOSIS — Z0000 Encounter for general adult medical examination without abnormal findings: Secondary | ICD-10-CM

## 2017-04-28 DIAGNOSIS — H6980 Other specified disorders of Eustachian tube, unspecified ear: Secondary | ICD-10-CM

## 2017-04-28 MED ORDER — TRIAMCINOLONE ACETONIDE 55 MCG/ACT NA AERO: 2.0000 | INHALATION_SPRAY | Freq: Every day | NASAL | 3 refills | Status: DC

## 2017-04-28 NOTE — Telephone Encounter (Signed)
During AWV, patient stated that he would like a prescription for a nasal spray to open his nasal passages. It was explained that most are OTC but patient's wife would like a recommendation from PCP due to potential incompatibility with patient's seizure medications. Also, patient would like a referral to ENT stating he feels that his ears and nose are not draining appropriately and stay stopped up, his throat stays "scratchy". Wife felt this would be a good idea adding that he has had sinus surgery in the pass.

## 2017-04-28 NOTE — Patient Instructions (Addendum)
Continue doing brain stimulating activities (puzzles, reading, adult coloring books, staying active) to keep memory sharp.   Start to eat heart healthy diet (full of fruits, vegetables, whole grains, lean protein, water--limit salt, fat, and sugar intake) and increase physical activity as tolerated.   Jeff Weaver , Thank you for taking time to come for your Medicare Wellness Visit. I appreciate your ongoing commitment to your health goals. Please review the following plan we discussed and let me know if I can assist you in the future.   These are the goals we discussed: Goals    . Be as active and as independent as possible          Get better organized by continuing to use my index cards, increased activity by walking and washing my car, monitor diet closer.       This is a list of the screening recommended for you and due dates:  Health Maintenance  Topic Date Due  . Colon Cancer Screening  04/10/2018  . DEXA scan (bone density measurement)  10/22/2018  . Tetanus Vaccine  07/24/2021  . Flu Shot  Completed  .  Hepatitis C: One time screening is recommended by Center for Disease Control  (CDC) for  adults born from [birth year] through 1967.   Completed  . Pneumonia vaccines  Completed   [place]  [phone]  Carbohydrate Counting for Diabetes Mellitus, Adult Carbohydrate counting is a method for keeping track of how many carbohydrates you eat. Eating carbohydrates naturally increases the amount of sugar (glucose) in the blood. Counting how many carbohydrates you eat helps keep your blood glucose within normal limits, which helps you manage your diabetes (diabetes mellitus). It is important to know how many carbohydrates you can safely have in each meal. This is different for every person. A diet and nutrition specialist (registered dietitian) can help you make a meal plan and calculate how many carbohydrates you  should have at each meal and snack. Carbohydrates are found in the following foods:  Grains, such as breads and cereals.  Dried beans and soy products.  Starchy vegetables, such as potatoes, peas, and corn.  Fruit and fruit juices.  Milk and yogurt.  Sweets and snack foods, such as cake, cookies, candy, chips, and soft drinks.  How do I count carbohydrates? There are two ways to count carbohydrates in food. You can use either of the methods or a combination of both. Reading "Nutrition Facts" on packaged food The "Nutrition Facts" list is included on the labels of almost all packaged foods and beverages in the [place] It includes:  The serving size.  Information about nutrients in each serving, including the grams (g) of carbohydrate per serving.  To use the "Nutrition Facts":  Decide how many servings you will have.  Multiply the number of servings by the number of carbohydrates per serving.  The resulting number is the total amount of carbohydrates that you will be having.  Learning standard serving sizes of other foods When you eat foods containing carbohydrates that are not packaged or do not include "Nutrition Facts" on the label, you need to measure the servings in order to count the amount of carbohydrates:  Measure the foods that you will eat with a food scale or measuring cup, if needed.  Decide how many standard-size servings you will eat.  Multiply the number of servings by 15. Most carbohydrate-rich foods have about 15 g  of carbohydrates per serving. ? For example, if you eat 8 oz (170 g) of strawberries, you will have eaten 2 servings and 30 g of carbohydrates (2 servings x 15 g = 30 g).  For foods that have more than one food mixed, such as soups and casseroles, you must count the carbohydrates in each food that is included.  The following list contains standard serving sizes of common carbohydrate-rich foods. Each of these servings has about 15 g of  carbohydrates:   hamburger bun or  English muffin.   oz (15 mL) syrup.   oz (14 g) jelly.  1 slice of bread.  1 six-inch tortilla.  3 oz (85 g) cooked rice or pasta.  4 oz (113 g) cooked dried beans.  4 oz (113 g) starchy vegetable, such as peas, corn, or potatoes.  4 oz (113 g) hot cereal.  4 oz (113 g) mashed potatoes or  of a large baked potato.  4 oz (113 g) canned or frozen fruit.  4 oz (120 mL) fruit juice.  4-6 crackers.  6 chicken nuggets.  6 oz (170 g) unsweetened dry cereal.  6 oz (170 g) plain fat-free yogurt or yogurt sweetened with artificial sweeteners.  8 oz (240 mL) milk.  8 oz (170 g) fresh fruit or one small piece of fruit.  24 oz (680 g) popped popcorn.  Example of carbohydrate counting Sample meal  3 oz (85 g) chicken breast.  6 oz (170 g) brown rice.  4 oz (113 g) corn.  8 oz (240 mL) milk.  8 oz (170 g) strawberries with sugar-free whipped topping. Carbohydrate calculation 1. Identify the foods that contain carbohydrates: ? Rice. ? Corn. ? Milk. ? Strawberries. 2. Calculate how many servings you have of each food: ? 2 servings rice. ? 1 serving corn. ? 1 serving milk. ? 1 serving strawberries. 3. Multiply each number of servings by 15 g: ? 2 servings rice x 15 g = 30 g. ? 1 serving corn x 15 g = 15 g. ? 1 serving milk x 15 g = 15 g. ? 1 serving strawberries x 15 g = 15 g. 4. Add together all of the amounts to find the total grams of carbohydrates eaten: ? 30 g + 15 g + 15 g + 15 g = 75 g of carbohydrates total. This information is not intended to replace advice given to you by your health care provider. Make sure you discuss any questions you have with your health care provider. Document Released: 06/20/2005 Document Revised: 01/08/2016 Document Reviewed: 12/02/2015 Elsevier Interactive Patient Education  [employer].

## 2017-04-28 NOTE — Telephone Encounter (Signed)
I can send in a RX - do they want it to go local?   Will refer to ENT

## 2017-04-28 NOTE — Telephone Encounter (Signed)
They stated the [facility] would be fine, it does not take long to send, thanks.

## 2017-05-01 ENCOUNTER — Other Ambulatory Visit: Payer: Self-pay | Admitting: Emergency Medicine

## 2017-05-01 MED ORDER — ZOSTER VAC RECOMB ADJUVANTED 50 MCG/0.5ML IM SUSR: 0.5000 mL | Freq: Once | INTRAMUSCULAR | 1 refills | Status: AC

## 2017-05-02 ENCOUNTER — Telehealth: Payer: Self-pay | Admitting: Emergency Medicine

## 2017-05-02 MED ORDER — FLUTICASONE PROPIONATE 50 MCG/ACT NA SUSP: 2.0000 | Freq: Every day | NASAL | 6 refills | Status: DC

## 2017-05-02 NOTE — Telephone Encounter (Signed)
Received fax from [facility] that Triamcinolone Nasal spray is discontinued, alternative- Flonase. Please advise if okay to send

## 2017-05-09 DIAGNOSIS — R270 Ataxia, unspecified: Secondary | ICD-10-CM | POA: Diagnosis not present

## 2017-05-09 DIAGNOSIS — R4189 Other symptoms and signs involving cognitive functions and awareness: Secondary | ICD-10-CM | POA: Diagnosis not present

## 2017-05-09 DIAGNOSIS — R5381 Other malaise: Secondary | ICD-10-CM | POA: Diagnosis not present

## 2017-05-09 DIAGNOSIS — R269 Unspecified abnormalities of gait and mobility: Secondary | ICD-10-CM | POA: Diagnosis not present

## 2017-05-09 DIAGNOSIS — R279 Unspecified lack of coordination: Secondary | ICD-10-CM | POA: Diagnosis not present

## 2017-05-09 DIAGNOSIS — R6889 Other general symptoms and signs: Secondary | ICD-10-CM | POA: Diagnosis not present

## 2017-05-09 DIAGNOSIS — R2681 Unsteadiness on feet: Secondary | ICD-10-CM | POA: Diagnosis not present

## 2017-05-15 MED FILL — SHINGRIX 50 MCG SUS: 50 | 1 days supply | Qty: 1 | Fill #0

## 2017-05-29 ENCOUNTER — Encounter: Payer: Self-pay | Admitting: Internal Medicine

## 2017-05-30 ENCOUNTER — Other Ambulatory Visit (INDEPENDENT_AMBULATORY_CARE_PROVIDER_SITE_OTHER): Payer: Medicare Other

## 2017-05-30 ENCOUNTER — Other Ambulatory Visit: Payer: Self-pay | Admitting: Emergency Medicine

## 2017-05-30 DIAGNOSIS — R3915 Urgency of urination: Secondary | ICD-10-CM | POA: Diagnosis not present

## 2017-05-30 DIAGNOSIS — R410 Disorientation, unspecified: Secondary | ICD-10-CM | POA: Diagnosis not present

## 2017-05-30 DIAGNOSIS — R739 Hyperglycemia, unspecified: Secondary | ICD-10-CM

## 2017-05-30 LAB — CBC WITH DIFFERENTIAL/PLATELET
Basophils Absolute: 0 10*3/uL (ref 0.0–0.1)
Basophils Relative: 0.6 % (ref 0.0–3.0)
Eosinophils Absolute: 0.1 10*3/uL (ref 0.0–0.7)
Eosinophils Relative: 1.2 % (ref 0.0–5.0)
HCT: 45.7 % (ref 39.0–52.0)
Hemoglobin: 15.2 g/dL (ref 13.0–17.0)
Lymphocytes Relative: 19.7 % (ref 12.0–46.0)
Lymphs Abs: 1.6 10*3/uL (ref 0.7–4.0)
MCHC: 33.2 g/dL (ref 30.0–36.0)
MCV: 93.8 fl (ref 78.0–100.0)
Monocytes Absolute: 0.6 10*3/uL (ref 0.1–1.0)
Monocytes Relative: 7.1 % (ref 3.0–12.0)
Neutro Abs: 5.7 10*3/uL (ref 1.4–7.7)
Neutrophils Relative %: 71.4 % (ref 43.0–77.0)
Platelets: 178 10*3/uL (ref 150.0–400.0)
RBC: 4.87 Mil/uL (ref 4.22–5.81)
RDW: 13.3 % (ref 11.5–15.5)
WBC: 8 10*3/uL (ref 4.0–10.5)

## 2017-05-30 LAB — COMPREHENSIVE METABOLIC PANEL
ALT: 43 U/L (ref 0–53)
AST: 23 U/L (ref 0–37)
Albumin: 4.1 g/dL (ref 3.5–5.2)
Alkaline Phosphatase: 40 U/L (ref 39–117)
BUN: 23 mg/dL (ref 6–23)
CO2: 28 mEq/L (ref 19–32)
Calcium: 9.9 mg/dL (ref 8.4–10.5)
Chloride: 101 mEq/L (ref 96–112)
Creatinine, Ser: 1.07 mg/dL (ref 0.40–1.50)
GFR: 72 mL/min (ref >60.00)
Glucose, Bld: 115 mg/dL — ABNORMAL HIGH (ref 70–99)
Potassium: 4.3 mEq/L (ref 3.5–5.1)
Sodium: 138 mEq/L (ref 135–145)
Total Bilirubin: 0.4 mg/dL (ref 0.2–1.2)
Total Protein: 7 g/dL (ref 6.0–8.3)

## 2017-05-30 LAB — URINALYSIS
Bilirubin Urine: NEGATIVE
Hgb urine dipstick: NEGATIVE
Ketones, ur: NEGATIVE
Leukocytes, UA: NEGATIVE
Nitrite: NEGATIVE
Specific Gravity, Urine: 1.02 (ref 1.000–1.030)
Total Protein, Urine: NEGATIVE
Urine Glucose: NEGATIVE
Urobilinogen, UA: 0.2 (ref 0.0–1.0)
pH: 6 (ref 5.0–8.0)

## 2017-05-30 LAB — HEMOGLOBIN A1C: Hgb A1c MFr Bld: 6.1 % (ref 4.6–6.5)

## 2017-05-30 MED FILL — AMOXICILLIN 500 MG CAPSULE: 500 | 2 days supply | Qty: 8 | Fill #2

## 2017-05-30 NOTE — Telephone Encounter (Signed)
Patient wife called requesting appointment today. I informed I did not have one. I set one for for tomorrow. She is still requesting to speak with Lovena Le because she can do something about it.

## 2017-05-31 ENCOUNTER — Ambulatory Visit (INDEPENDENT_AMBULATORY_CARE_PROVIDER_SITE_OTHER): Payer: Medicare Other | Admitting: Internal Medicine

## 2017-05-31 ENCOUNTER — Encounter: Payer: Self-pay | Admitting: Internal Medicine

## 2017-05-31 VITALS — BP 118/68 | HR 64 | Temp 97.9°F | Resp 16 | Wt 257.0 lb

## 2017-05-31 DIAGNOSIS — R410 Disorientation, unspecified: Secondary | ICD-10-CM

## 2017-05-31 DIAGNOSIS — R1310 Dysphagia, unspecified: Secondary | ICD-10-CM | POA: Diagnosis not present

## 2017-05-31 DIAGNOSIS — G4733 Obstructive sleep apnea (adult) (pediatric): Secondary | ICD-10-CM

## 2017-05-31 LAB — URINE CULTURE
MICRO NUMBER:: 81330115
Result:: NO GROWTH
SPECIMEN QUALITY:: ADEQUATE

## 2017-05-31 MED ORDER — CLOTRIMAZOLE 1 % EX CREA: 1.0000 "application " | TOPICAL_CREAM | Freq: Two times a day (BID) | CUTANEOUS | 0 refills | Status: DC

## 2017-05-31 MED ORDER — MICONAZOLE NITRATE 2 % EX AERP: INHALATION_SPRAY | CUTANEOUS | 2 refills | Status: DC

## 2017-05-31 MED FILL — LOTRIMIN AF POWDER 2 % AERP: 2 | 30 days supply | Qty: 133 | Fill #0

## 2017-05-31 MED FILL — CLOTRIMAZOLE 1% CREAM: 1 | 30 days supply | Qty: 28 | Fill #0

## 2017-05-31 NOTE — Assessment & Plan Note (Signed)
Has not been using the cpap due to mask leaky - was off of it for several months Just got a new mask and started using it nightly Stressed importance of compliance

## 2017-05-31 NOTE — Telephone Encounter (Signed)
Appt has been made and lab work has been placed per MD

## 2017-05-31 NOTE — Progress Notes (Signed)
Subjective:    Patient ID: Jeff Weaver, male    DOB: [birth year], 71 y.o.   MRN: [id number]  HPI He is here for an acute visit.   His daughter and wife are here who tell most of the history.    Several days ago his wife and daughter noticed he was more confused and was mixing up words more than usual.  He does have some baseline memory issues and has been mixing up words, but it is worse.  They were concerned it may be a seizure and his neurologist advised them to follow up with me to rule out an infection, high/low sugars.  She also mentioned concern over him not being on his cpap for several months.  The past couple of days he had difficulty working the tv - his daughter had to show him how.  He never had issues with this before.    He will start PT for 8 weeks ( 2 days / week) soon.      OSA:  He follows at the [place].  For a while he was not using the cpap due to mask leaking.  He has restarted using it regularly two days ago once he got a new mask. Marland Kitchen    Dysphagia: 3-4 weeks ago he had some tenderness in the glands in his neck.  He has had some difficulty swallowing pills at times.  He denies a sore throat.  He denies difficulty swallowing liquids.  Sometimes he has to drink water when he eats meat.  Over the weekend he almost choked.  It feels like it never went down and he did bring it back up.   This was the first time this occurred.  He had eaten a ham sandwich.     Medications and allergies reviewed with patient and updated if appropriate.  Patient Active Problem List   Diagnosis Date Noted  . Osteoporosis 01/30/2017  . Cellulitis 01/08/2017  . Superficial thrombophlebitis 01/08/2017  . SOB (shortness of breath) 10/05/2016  . Seborrheic dermatitis 07/25/2016  . Cognitive communication deficit 06/28/2016  . Hypertensive heart disease 06/28/2016  . Vitamin B 12 deficiency 03/16/2016  . Memory difficulties 03/13/2016  . Confusion 03/13/2016  . VP (ventriculoperitoneal) shunt  status 02/12/2016  . Orthostatic hypotension 01/05/2016  . Benign prostatic hyperplasia with urinary obstruction 08/04/2014  . Urinary urgency 08/04/2014  . Gastroesophageal reflux disease 07/14/2014  . Polymyalgia rheumatica ([place]) 05/28/2014  . 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 Acute non-ST-elevation myocardial infarction ([place]) 11/25/2013  . SBE (subacute bacterial endocarditis) prophylaxis candidate 11/24/2013  . Aphasia 07/17/2013  . 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 seizures, not intractable, without status epilepticus ([employer]) 07/17/2013  . Subdural hemorrhage ([place]) 07/17/2013  . Pre-diabetes 03/26/2013  . Increased frequency of urination 10/31/2012  . Nocturia 10/31/2012  . History of cerebral aneurysm repair 10/18/2012  . History of stroke without residual deficits 10/18/2012  . Blepharitis of both eyes 09/27/2012  . 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 Seizure disorder ([place]) 07/23/2012  . Entropion 07/10/2012  . Hyperopia with astigmatism 07/10/2012  . Combined senile cataract 04/13/2012  . Presbyopia 04/13/2012  . 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) 03/01/2012  . Cognitive safety issue 02/20/2012  . Nonruptured cerebral aneurysm 11/23/2011  . Obstructive sleep apnea syndrome 08/09/2011  . Hyperlipidemia 02/03/2009  . HTN (hypertension) 02/03/2009  . First degree atrioventricular block 02/03/2009  . OSTEOARTHRITIS 02/03/2009  . REGIONAL ENTERITIS, LARGE INTESTINE 04/17/2008  Current Outpatient Medications on File Prior to Visit  Medication Sig Dispense Refill  . acetaminophen (TYLENOL) 650 MG CR tablet Take 650 mg by mouth every 8 (eight) hours as needed for pain.    . blood glucose meter kit and supplies KIT Dispense based on patient and insurance preference. Use up to four times daily as directed. (prediabetes hypoglycemia). 1 each 0  . cetirizine (ZYRTEC) 10 MG tablet Take 1 tablet (10 mg total) by  mouth daily. 30 tablet 11  . Cholecalciferol (VITAMIN D PO) Take 1,000 mg by mouth every morning.    . fluticasone (FLONASE) 50 MCG/ACT nasal spray Place 2 sprays into both nostrils daily. 48 g 6  . Lacosamide 100 MG TABS Take 200 mg by mouth 2 (two) times daily.     Marland Kitchen levETIRAcetam (KEPPRA) 750 MG tablet Take 750 mg by mouth 2 (two) times daily.    Marland Kitchen LORazepam (ATIVAN) 1 MG tablet Take 1 mg by mouth every 8 (eight) hours.    . mirabegron ER (MYRBETRIQ) 25 MG TB24 tablet Take 25 mg by mouth every evening.     . Multiple Vitamins-Minerals (MULTIVITAMIN) tablet Take 1 tablet by mouth daily.    . NON FORMULARY CPAP Machine: authorized by [facility] in [place]    . pyridOXINE (VITAMIN B-6) 50 MG tablet 100 mg.   11  . Tamsulosin HCl (FLOMAX) 0.4 MG CAPS Take 0.8 mg by mouth at bedtime.     . thiamine 100 MG tablet Take 100 mg by mouth daily.    . vitamin B-12 (CYANOCOBALAMIN) 1000 MCG tablet Take 1,000 mcg by mouth daily.    Marland Kitchen losartan (COZAAR) 25 MG tablet Take 1 tablet (25 mg total) by mouth 2 (two) times daily. 180 tablet 3   No current facility-administered medications on file prior to visit.     Past Medical History:  Diagnosis Date  . Aortic insufficiency   . Cerebral aneurysm    a. s/p clipping at [facility] in 8/13 c/b short-term memory loss, cerebral hemorrhage and seizure disorder s/p VP shunt.  . Cerebral hemorrhage ([place])   . Chronic diastolic CHF (congestive heart failure) ([place])   . CVA (cerebral infarction)   . Diverticulosis   . Enteritis (regional)    Dr Olevia Perches  . First degree AV block   . GI bleed   . HTN (hypertension)   . Hyperlipidemia   . Hypertensive heart disease   . IBS (irritable bowel syndrome)   . Orthostatic hypotension   . Pre-diabetes   . Regional enteritis of large intestine ([place]) since 1978  . Rheumatoid arthritis(714.0)    dxed in [employer] in 1980s  . Seizures ([place])   . Sinus bradycardia   . Sleep apnea     Past Surgical History:  Procedure Laterality Date  .  CHOLECYSTECTOMY N/A 01/07/2016   Procedure: LAPAROSCOPIC CHOLECYSTECTOMY ;  Surgeon: Coralie Keens, MD;  Location: [place];  Service: General;  Laterality: N/A;  . cns shunt  02/23/12  . COLONOSCOPY W/ POLYPECTOMY  1978   negative 2009, Dr Olevia Perches. Due 2014  . CRANIOTOMY  02/02/12   Dr Harvel Ricks, [place] of aneurysm  . 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 HERNIA REPAIR    . LEFT HEART CATHETERIZATION WITH CORONARY ANGIOGRAM N/A 11/26/2013   Procedure: LEFT HEART CATHETERIZATION WITH CORONARY ANGIOGRAM;  Surgeon: [name], MD;  Location[facility] C[facility] LAB;  Service: Cardiovascular;  Laterality: N/A;  .  NOSE SURGERY    . SHOULDER SURGERY  1997  . TONSILLECTOMY AND ADENOIDECTOMY    . VENTRICULOPERITONEAL SHUNT  02-23-12   INSERTION OF RIGHT FRONTAL VENTRICULOPERITONEAL SHUNT WITH A CODMAN HAKIM PROGRAMMABLE VALVE    Social History   Socioeconomic History  . Marital status: Married    Spouse name: None  . Number of children: 1  . Years of education: None  . Highest education level: None  Social Needs  . Financial resource strain: None  . Food insecurity - worry: None  . Food insecurity - inability: None  . Transportation needs - medical: None  . Transportation needs - non-medical: None  Occupational History  . Occupation: retired  Tobacco Use  . Smoking status: Former Smoker    Types: Pipe    Last attempt to quit: 07/04/2009    Years since quitting: 7.9  . Smokeless tobacco: Former Network engineer and Sexual Activity  . Alcohol use: Yes    Alcohol/week: 0.0 oz    Comment: Very Infrequently   . Drug use: No  . Sexual activity: None  Other Topics Concern  . None  Social History Narrative  . None    Family History  Problem Relation Age of Onset  . COPD Father   . Coronary artery disease Father        MI in 72s  . Hepatitis Mother   . Diabetes Sister   . Dementia Sister 93  . Diabetes Brother     . Colon cancer Neg Hx     Review of Systems  Constitutional: Negative for chills and fever.  HENT: Positive for postnasal drip and trouble swallowing.   Respiratory: Positive for cough and shortness of breath (with stairs). Negative for wheezing.   Cardiovascular: Negative for chest pain.  Gastrointestinal: Negative for abdominal pain and diarrhea.  Neurological: Positive for headaches (frequent).       Objective:   Vitals:   05/31/17 1446  BP: 118/68  Pulse: 64  Resp: 16  Temp: 97.9 F (36.6 C)  SpO2: 94%   Filed Weights   05/31/17 1446  Weight: 257 lb (116.6 kg)   Body mass index is 39.08 kg/m.  Wt Readings from Last 3 Encounters:  05/31/17 257 lb (116.6 kg)  04/28/17 260 lb (117.9 kg)  04/20/17 257 lb (116.6 kg)     Physical Exam Constitutional: Appears well-developed and well-nourished. No distress.  HENT:  Head: Normocephalic and atraumatic.  Neck: Neck supple. No tracheal deviation present. No thyromegaly present.  No cervical lymphadenopathy Cardiovascular: Normal rate, regular rhythm and normal heart sounds.   2/6 systolic murmur heard. No carotid bruit .  Trace b/l LE edema Pulmonary/Chest: Effort normal and breath sounds normal. No respiratory distress. No has no wheezes. No rales.  Abd: soft, non tender, non distended Skin: Skin is warm and dry. Not diaphoretic.  Psychiatric: Normal mood and affect. Behavior is normal.       Assessment & Plan:   See Problem List for Assessment and Plan of chronic medical problems.

## 2017-05-31 NOTE — Assessment & Plan Note (Signed)
Once episode of choking Has had some mild dysphagia - it is inconsistent and difficulty to get a good history Will f/u with GI if symptoms persist

## 2017-05-31 NOTE — Patient Instructions (Signed)
  We reviewed your blood work.  There is no evidence of an infection.  Your sugars are in the prediabetic range.     Medications reviewed and updated.  No changes recommended at this time.

## 2017-05-31 NOTE — Assessment & Plan Note (Signed)
Several days of confusion ? Infection, sugars, from not sleeping / using cpap ? Neurological Blood work and urine done yesterday - no evidence of infection Sugars stable in prediabetic range and unlikely to be causing any symptoms Stressed cpap use nightly - he has started using his new machine two days ago Symptoms sound more neurological in nature -- advised follow up with neuro -- ? Did he have a seizure in his sleep

## 2017-06-01 DIAGNOSIS — J343 Hypertrophy of nasal turbinates: Secondary | ICD-10-CM | POA: Insufficient documentation

## 2017-06-01 DIAGNOSIS — R1313 Dysphagia, pharyngeal phase: Secondary | ICD-10-CM | POA: Insufficient documentation

## 2017-06-01 DIAGNOSIS — H903 Sensorineural hearing loss, bilateral: Secondary | ICD-10-CM | POA: Insufficient documentation

## 2017-06-01 DIAGNOSIS — R413 Other amnesia: Secondary | ICD-10-CM | POA: Insufficient documentation

## 2017-06-02 DIAGNOSIS — R279 Unspecified lack of coordination: Secondary | ICD-10-CM | POA: Diagnosis not present

## 2017-06-02 DIAGNOSIS — R270 Ataxia, unspecified: Secondary | ICD-10-CM | POA: Diagnosis not present

## 2017-06-02 DIAGNOSIS — R269 Unspecified abnormalities of gait and mobility: Secondary | ICD-10-CM | POA: Diagnosis not present

## 2017-06-02 DIAGNOSIS — R4189 Other symptoms and signs involving cognitive functions and awareness: Secondary | ICD-10-CM | POA: Diagnosis not present

## 2017-06-08 ENCOUNTER — Ambulatory Visit (INDEPENDENT_AMBULATORY_CARE_PROVIDER_SITE_OTHER): Payer: Medicare Other | Admitting: Cardiology

## 2017-06-08 ENCOUNTER — Encounter: Payer: Self-pay | Admitting: *Deleted

## 2017-06-08 ENCOUNTER — Encounter: Payer: Self-pay | Admitting: Cardiology

## 2017-06-08 VITALS — BP 132/74 | HR 70 | Ht 68.0 in | Wt 262.0 lb

## 2017-06-08 DIAGNOSIS — I10 Essential (primary) hypertension: Secondary | ICD-10-CM

## 2017-06-08 DIAGNOSIS — I110 Hypertensive heart disease with heart failure: Secondary | ICD-10-CM | POA: Diagnosis not present

## 2017-06-08 DIAGNOSIS — R079 Chest pain, unspecified: Secondary | ICD-10-CM

## 2017-06-08 DIAGNOSIS — R0609 Other forms of dyspnea: Secondary | ICD-10-CM

## 2017-06-08 DIAGNOSIS — E785 Hyperlipidemia, unspecified: Secondary | ICD-10-CM

## 2017-06-08 DIAGNOSIS — I951 Orthostatic hypotension: Secondary | ICD-10-CM

## 2017-06-08 DIAGNOSIS — R0602 Shortness of breath: Secondary | ICD-10-CM | POA: Insufficient documentation

## 2017-06-08 DIAGNOSIS — R0600 Dyspnea, unspecified: Secondary | ICD-10-CM

## 2017-06-08 NOTE — Progress Notes (Signed)
Cardiology Office Note    Date:  06/08/2017  ID:  Jeff Weaver, DOB 1945/08/30, MRN [id number] PCP:  [name], MD  Cardiologist:  [name]   Chief Complaint: f/u blood pressure, orthostatic hypotension  History of Present Illness:  Jeff Weaver is a 71 y.o. male with history of obesity, HTN, HLD, chronic diastolic CHF/hypertensive heart disease, prediabetes, cerebral aneurysm s/p clipping at [facility] in 8/13 c/b short-term memory loss, cerebral hemorrhage and seizure disorder s/p VP shunt, sleep apnea, mild AI, sinus bradycardia (baseline HR 50s, 1st degree AV block) who presents for f/u BP.  First seen by Dr. [name] 04/2013 for chest pain with normal nuclear stress test. Admitted 11/2013 for chest pain and elevated troponin (0.74) -> LHC 11/26/13 revealed angiographically normal coronary arteries, mild mid LAD myocardial bridging, moderate to severely elevated LVEDP - with systemic hypertension and preserved LVEF. Echo with EF 60-65%, LVH, pt did not follow up after that. More recently admitted 01/2016 for confusion/fever with possible PNA then diagnosis of acute cholecystitis s/p removal of gallbladder. He's since been followed by PCP for ongoing confusion and fatigue, with treatment of UTI. 2D echo in 01/2016 showed mod LVH, EF 60-65%, grade 2 DD, no RWMA, mild AI. Labs 03/2016 with Na 134, K 4.4, glucose 102, normal TSH, A1C and CBC.  He was seen by Robbie Lis PA-C 03/25/16 reporting fluctuating BP, dizziness upon standing, intermittent chest discomfort and LEE. His neurologist had requested cardiology manage his BP. He had reported BP 110/70s AM and 244W-102V systolic in PM. He was positive for orthostasis in the office with BP dropping from 120/74 layng to 95/63 standing with associated dizziness. Procardia was felt to be contributing to edema so this was stopped. Compression hose were recommended. Nuclear stress test was normal, EF >65%.  06/28/2016 - this is first time I'm seeing this nice  patient, he is accompanied by his wife and daughter, he feels slightly better, his neurologist is still trying to tune his seizure medications. He feels higher, he is experiencing mild orthostatic hypotension especially when he gets up first in the morning however significantly improved after stopping nifedipine and no presyncope or syncope. He has noticed some mild lower extremity edema after he was advised by his neurologist to increase his salt intake. He will have occasional chest pain that are not related to exertion. He hasn't been walking as he has been dealing with plantar fasciitis.   10/06/2016, 3 months follow-up, patient states that he has had no chest pain or shortness of breath however developed cough 2 weeks ago that is productive. He has been started on a second round of antibiotic doxycycline and cough medicine by his primary care physician yesterday. He denies any fever or chills. No palpitation or syncope. He continues to have orthostatic hypotension on a daily basis. No lower extremity edema.  06/08/2017 - the patient is coming after 6 months, again he has been suffering chronically from short memory impairment, his wife states that his diet hasn't been optimal in he eats quite a lot of salt. He has noticed that in the last few months he has been getting progressively more short of breath, they have 14 steps at home and when he gets to the top he gets short of breath sometimes it is associated with chest pressure radiating to his left shoulder. He underwent stress test a year ago that was negative for ischemia. He denies any palpitations or syncope.  Past Medical History:  Diagnosis Date  .  Aortic insufficiency   . Cerebral aneurysm    a. s/p clipping at [facility] in 8/13 c/b short-term memory loss, cerebral hemorrhage and seizure disorder s/p VP shunt.  . Cerebral hemorrhage ([place])   . Chronic diastolic CHF (congestive heart failure) ([place])   . CVA (cerebral infarction)   .  Diverticulosis   . Enteritis (regional)    Dr Olevia Perches  . First degree AV block   . GI bleed   . HTN (hypertension)   . Hyperlipidemia   . Hypertensive heart disease   . IBS (irritable bowel syndrome)   . Orthostatic hypotension   . Pre-diabetes   . 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 Rheumatoid arthritis(714.0)    dxed in [employer] in 1980s  . Seizures ([place])   . Sinus bradycardia   . Sleep apnea     Past Surgical History:  Procedure Laterality Date  . CHOLECYSTECTOMY N/A 01/07/2016   Procedure: LAPAROSCOPIC CHOLECYSTECTOMY ;  Surgeon: Coralie Keens, MD;  Location: [place];  Service: General;  Laterality: N/A;  . cns shunt  02/23/12  . COLONOSCOPY W/ POLYPECTOMY  1978   negative 2009, Dr Olevia Perches. Due 2014  . CRANIOTOMY  02/02/12   Dr Harvel Ricks, [place] of aneurysm  . 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 HERNIA REPAIR    . LEFT HEART CATHETERIZATION WITH CORONARY ANGIOGRAM N/A 11/26/2013   Procedure: LEFT HEART CATHETERIZATION WITH CORONARY ANGIOGRAM;  Surgeon: [name], MD;  Location: [facility] CATH LAB;  Service: Cardiovascular;  Laterality: N/A;  . NOSE SURGERY    . SHOULDER SURGERY  1997  . TONSILLECTOMY AND ADENOIDECTOMY    . VENTRICULOPERITONEAL SHUNT  02-23-12   INSERTION OF RIGHT FRONTAL VENTRICULOPERITONEAL SHUNT WITH A CODMAN HAKIM PROGRAMMABLE VALVE    Current Medications: Current Outpatient Medications  Medication Sig Dispense Refill  . acetaminophen (TYLENOL) 650 MG CR tablet Take 650 mg by mouth every 8 (eight) hours as needed for pain.    . blood glucose meter kit and supplies KIT Dispense based on patient and insurance preference. Use up to four times daily as directed. (prediabetes hypoglycemia). 1 each 0  . cetirizine (ZYRTEC) 10 MG tablet Take 1 tablet (10 mg total) by mouth daily. 30 tablet 11  . Cholecalciferol (VITAMIN D PO) Take 1,000 mg by mouth every morning.      . clotrimazole (LOTRIMIN) 1 % cream Apply 1 application topically 2 (two) times daily. Use for 14 days 30 g 0  . fluticasone (FLONASE) 50 MCG/ACT nasal spray Place 2 sprays into both nostrils daily. 48 g 6  . Lacosamide 100 MG TABS Take 200 mg by mouth 2 (two) times daily.     Marland Kitchen levETIRAcetam (KEPPRA) 750 MG tablet Take 750 mg by mouth 2 (two) times daily.    Marland Kitchen LORazepam (ATIVAN) 1 MG tablet Take 1 mg by mouth every 8 (eight) hours.    Marland Kitchen losartan (COZAAR) 25 MG tablet Take 1 tablet (25 mg total) by mouth 2 (two) times daily. 180 tablet 3  . Miconazole Nitrate (LOTRIMIN AF JOCK ITCH POWDER) 2 % AERP Apply topically once or twice daily 130 g 2  . mirabegron ER (MYRBETRIQ) 25 MG TB24 tablet Take 25 mg by mouth every evening.     . Multiple Vitamins-Minerals (MULTIVITAMIN) tablet Take 1 tablet by mouth daily.    . NON FORMULARY CPAP Machine: authorized by [place] in [place]    . pyridOXINE (  VITAMIN B-6) 50 MG tablet 100 mg.   11  . Tamsulosin HCl (FLOMAX) 0.4 MG CAPS Take 0.8 mg by mouth at bedtime.     . thiamine 100 MG tablet Take 100 mg by mouth daily.    . vitamin B-12 (CYANOCOBALAMIN) 1000 MCG tablet Take 1,000 mcg by mouth daily.     No current facility-administered medications for this visit.      Allergies:   Crestor [rosuvastatin]; Haloperidol; Simvastatin; Lisinopril; and Pravastatin   Social History   Socioeconomic History  . Marital status: Married    Spouse name: None  . Number of children: 1  . Years of education: None  . Highest education level: None  Social Needs  . Financial resource strain: None  . Food insecurity - worry: None  . Food insecurity - inability: None  . Transportation needs - medical: None  . Transportation needs - non-medical: None  Occupational History  . Occupation: retired  Tobacco Use  . Smoking status: Former Smoker    Types: Pipe    Last attempt to quit: 07/04/2009    Years since quitting: 7.9  . Smokeless tobacco: Former Network engineer and  Sexual Activity  . Alcohol use: Yes    Alcohol/week: 0.0 oz    Comment: Very Infrequently   . Drug use: No  . Sexual activity: None  Other Topics Concern  . None  Social History Narrative  . None     Family History:  The patient's family history includes COPD in his father; Coronary artery disease in his father; Dementia (age of onset: 16) in his sister; Diabetes in his brother and sister; Hepatitis in his mother.   ROS:  + arthritis in multiple joints. Please see the history of present illness. All other systems are reviewed and otherwise negative.    PHYSICAL EXAM:   VS:  BP 132/74   Pulse 70   Ht _0  (1.727 m)   Wt 262 lb (118.8 kg)   BMI 39.84 kg/m   BMI: Body mass index is 39.84 kg/m. GEN: Well nourished, well developed WM, obese, in no acute distress  HEENT: normocephalic, atraumatic Neck: no JVD, carotid bruits, or masses Cardiac: RRR; no murmurs, rubs, or gallops, no edema  Respiratory:  clear to auscultation bilaterally, normal work of breathing GI: soft, nontender, nondistended, + BS MS: no deformity or atrophy  Skin: warm and dry, no rash Neuro:  Alert and Oriented x 3, Strength and sensation are intact, follows commands Psych: euthymic mood, full affect  Wt Readings from Last 3 Encounters:  06/08/17 262 lb (118.8 kg)  05/31/17 257 lb (116.6 kg)  04/28/17 260 lb (117.9 kg)    Studies/Labs Reviewed:   EKG: Personally reviewed, shows sinus rhythm first degree AV block, they're all nines LVH, nonspecific ST-T wave abnormalities unchanged from prior.  Recent Labs: 01/30/2017: TSH 1.46 05/30/2017: ALT 43; BUN 23; Creatinine, Ser 1.07; Hemoglobin 15.2; Platelets 178.0; Potassium 4.3; Sodium 138   Lipid Panel    Component Value Date/Time   CHOL 186 01/30/2017 1158   CHOL 159 03/27/2013 0818   TRIG 170.0 (H) 01/30/2017 1158   TRIG 127 03/27/2013 0818   HDL 44.70 01/30/2017 1158   HDL 45 03/27/2013 0818   CHOLHDL 4 01/30/2017 1158   VLDL 34.0 01/30/2017  1158   LDLCALC 108 (H) 01/30/2017 1158   LDLCALC 89 03/27/2013 0818   LDLDIRECT 87.6 09/19/2012 1206    Additional studies/ records that were reviewed today include: Summarized above  ASSESSMENT & PLAN:   1. Dyspnea on exertion associated with chest pain, this is suspicious for angina, he had normal stress test a year ago however we'll proceed with coronary CTA and possible CT FFR if needed. He does have elevated cholesterol however with his memory impairment I would try to stay away from statins. 2. Orthostatic hypotension/labile hypertension - resolved after cessation of nifedipine. He is now taking losartan 25 mg by mouth twice a day what is unusual dosing but it seems to be working for him so I would continue.  3. Chronic diastolic CHF - resolved.  he appears euvolemic. 4. Obstructive sleep apnea - The patient has f/u coming up at the [place] soon to review his CPAP therapy and sleep apnea. He is inconsistent with use which could also contribute to BP lability.  Disposition: F/u with Dr. [name] in 6 months.   Medication Adjustments/Labs and Tests Ordered: Current medicines are reviewed at length with the patient today.  Concerns regarding medicines are outlined above. Medication changes, Labs and Tests ordered today are summarized above and listed in the Patient Instructions accessible in Encounters.   Signed, Ena Dawley, MD 06/08/2017 10:47 AM    [facility] [address], [place], [place]  [address] Phone: [phone]; Fax: [phone]

## 2017-06-08 NOTE — Patient Instructions (Signed)
Medication Instructions:   Your physician recommends that you continue on your current medications as directed. Please refer to the Current Medication list given to you today.    Testing/Procedures:  CORONARY CT WITH FFR  Please arrive at the [facility] main entrance of [facility] at xx:xx AM (30-45 minutes prior to test start time)  [facility] [address] [place], [place] [address] [phone]  Proceed to the [facility] Radiology Department (First Floor).  Please follow these instructions carefully (unless otherwise directed):   On the Night Before the Test: . Drink plenty of water. . Do not consume any caffeinated/decaffeinated beverages or chocolate 12 hours prior to your test. . Do not take any antihistamines 12 hours prior to your test.   On the Day of the Test: . Drink plenty of water. Do not drink any water within one hour of the test. . Do not eat any food 4 hours prior to the test. . You may take your regular medications prior to the test.  After the Test: . Drink plenty of water. . After receiving IV contrast, you may experience a mild flushed feeling. This is normal. . On occasion, you may experience a mild rash up to 24 hours after the test. This is not dangerous. If this occurs, you can take Benadryl 25 mg and increase your fluid intake. . If you experience trouble breathing, this can be serious. If it is severe call 911 IMMEDIATELY. If it is mild, please call our office.   Follow-Up:  Your physician wants you to follow-up in: [place] will receive a reminder letter in the mail two months in advance. If you don't receive a letter, please call our office to schedule the follow-up appointment.     If you need a refill on your cardiac medications before your next appointment, please call your pharmacy.

## 2017-06-19 DIAGNOSIS — R569 Unspecified convulsions: Secondary | ICD-10-CM | POA: Diagnosis not present

## 2017-06-19 DIAGNOSIS — R9401 Abnormal electroencephalogram [EEG]: Secondary | ICD-10-CM | POA: Diagnosis not present

## 2017-06-19 DIAGNOSIS — Z79899 Other long term (current) drug therapy: Secondary | ICD-10-CM | POA: Diagnosis not present

## 2017-06-20 DIAGNOSIS — R279 Unspecified lack of coordination: Secondary | ICD-10-CM | POA: Diagnosis not present

## 2017-06-20 DIAGNOSIS — R269 Unspecified abnormalities of gait and mobility: Secondary | ICD-10-CM | POA: Diagnosis not present

## 2017-06-20 DIAGNOSIS — R4189 Other symptoms and signs involving cognitive functions and awareness: Secondary | ICD-10-CM | POA: Diagnosis not present

## 2017-06-20 DIAGNOSIS — R270 Ataxia, unspecified: Secondary | ICD-10-CM | POA: Diagnosis not present

## 2017-06-22 DIAGNOSIS — R279 Unspecified lack of coordination: Secondary | ICD-10-CM | POA: Diagnosis not present

## 2017-06-22 DIAGNOSIS — R269 Unspecified abnormalities of gait and mobility: Secondary | ICD-10-CM | POA: Diagnosis not present

## 2017-06-22 DIAGNOSIS — R270 Ataxia, unspecified: Secondary | ICD-10-CM | POA: Diagnosis not present

## 2017-06-22 DIAGNOSIS — R4189 Other symptoms and signs involving cognitive functions and awareness: Secondary | ICD-10-CM | POA: Diagnosis not present

## 2017-07-03 ENCOUNTER — Ambulatory Visit: Payer: Medicare Other | Admitting: Cardiology

## 2017-07-03 ENCOUNTER — Encounter: Payer: Self-pay | Admitting: Cardiology

## 2017-07-05 DIAGNOSIS — R270 Ataxia, unspecified: Secondary | ICD-10-CM | POA: Diagnosis not present

## 2017-07-05 DIAGNOSIS — G3184 Mild cognitive impairment, so stated: Secondary | ICD-10-CM | POA: Diagnosis not present

## 2017-07-07 DIAGNOSIS — R270 Ataxia, unspecified: Secondary | ICD-10-CM | POA: Diagnosis not present

## 2017-07-07 DIAGNOSIS — G3184 Mild cognitive impairment, so stated: Secondary | ICD-10-CM | POA: Diagnosis not present

## 2017-07-10 ENCOUNTER — Ambulatory Visit (HOSPITAL_COMMUNITY): Admission: RE | Admit: 2017-07-10 | Payer: Medicare Other | Source: Ambulatory Visit

## 2017-07-10 ENCOUNTER — Ambulatory Visit (HOSPITAL_COMMUNITY)
Admission: RE | Admit: 2017-07-10 | Discharge: 2017-07-10 | Disposition: A | Discharge status: Home / Self Care | Payer: Medicare Other | Source: Ambulatory Visit | Attending: Cardiology | Admitting: Cardiology

## 2017-07-10 DIAGNOSIS — I2510 Atherosclerotic heart disease of native coronary artery without angina pectoris: Secondary | ICD-10-CM | POA: Diagnosis not present

## 2017-07-10 DIAGNOSIS — R0609 Other forms of dyspnea: Secondary | ICD-10-CM

## 2017-07-10 DIAGNOSIS — E785 Hyperlipidemia, unspecified: Secondary | ICD-10-CM

## 2017-07-10 DIAGNOSIS — I110 Hypertensive heart disease with heart failure: Secondary | ICD-10-CM | POA: Insufficient documentation

## 2017-07-10 DIAGNOSIS — I951 Orthostatic hypotension: Secondary | ICD-10-CM | POA: Insufficient documentation

## 2017-07-10 DIAGNOSIS — R079 Chest pain, unspecified: Secondary | ICD-10-CM | POA: Insufficient documentation

## 2017-07-10 DIAGNOSIS — Z8249 Family history of ischemic heart disease and other diseases of the circulatory system: Secondary | ICD-10-CM | POA: Diagnosis not present

## 2017-07-10 DIAGNOSIS — R0600 Dyspnea, unspecified: Secondary | ICD-10-CM

## 2017-07-10 DIAGNOSIS — R0789 Other chest pain: Secondary | ICD-10-CM | POA: Diagnosis not present

## 2017-07-10 DIAGNOSIS — I10 Essential (primary) hypertension: Secondary | ICD-10-CM

## 2017-07-10 DIAGNOSIS — I288 Other diseases of pulmonary vessels: Secondary | ICD-10-CM | POA: Diagnosis not present

## 2017-07-10 LAB — POCT I-STAT CREATININE: Creatinine, Ser: 0.9 mg/dL (ref 0.61–1.24)

## 2017-07-10 MED ORDER — NITROGLYCERIN 0.4 MG SL SUBL
0.8000 mg | SUBLINGUAL_TABLET | Freq: Once | SUBLINGUAL | Status: AC
  Administered 2017-07-10: 0.8 mg via SUBLINGUAL

## 2017-07-10 MED ORDER — NITROGLYCERIN 0.4 MG SL SUBL
SUBLINGUAL_TABLET | SUBLINGUAL | Status: AC
  Filled 2017-07-10: qty 2

## 2017-07-10 MED ORDER — IOPAMIDOL (ISOVUE-370) INJECTION 76%
INTRAVENOUS | Status: AC
  Filled 2017-07-10: qty 100

## 2017-07-12 ENCOUNTER — Telehealth: Payer: Self-pay | Admitting: *Deleted

## 2017-07-12 MED ORDER — ASPIRIN EC 81 MG PO TBEC: 81.0000 mg | DELAYED_RELEASE_TABLET | Freq: Every day | ORAL | 3 refills | Status: DC

## 2017-07-12 NOTE — Telephone Encounter (Signed)
-----   Message from Dorothy Spark, MD sent at 07/11/2017  7:51 PM EST ----- Abnormal coronary CTA, please schedule a left heart catheterization, he needs to start taking aspirin 81 mg po daily, he is intolerant to stains, please refer to the lipid clinic.

## 2017-07-12 NOTE — Telephone Encounter (Signed)
Notified the pts wife (on DPR and manages pts care), that per Dr [name], the pt had an abnormal coronary CTA, he needs to be scheduled for  a left heart catheterization, he needs to start taking aspirin 81 mg po daily, he is intolerant to stains, please refer to the lipid clinic.  Being the pt is outside the 30 day window with seeing a MD, he will need an appt with Dr Francesca Oman PA-C to discuss cath needed, and schedule it at that time.  Scheduled the pt to see Estella Husk PA-C for next Monday 07/17/17 at 1100.  Advised the pts wife to have him here 15 mins prior to that appt.  Advised the pts wife to go ahead and start him on ASA 81 mg po daily.  Also informed the pts wife that I will send our [facility] schedulers a message to call her back to arrange for pts lipid clinic appt.  Wife verbalized understanding and agrees with this plan.

## 2017-07-14 DIAGNOSIS — R270 Ataxia, unspecified: Secondary | ICD-10-CM | POA: Diagnosis not present

## 2017-07-14 DIAGNOSIS — G3184 Mild cognitive impairment, so stated: Secondary | ICD-10-CM | POA: Diagnosis not present

## 2017-07-17 ENCOUNTER — Encounter: Payer: Self-pay | Admitting: Physician Assistant

## 2017-07-17 ENCOUNTER — Telehealth: Payer: Self-pay | Admitting: *Deleted

## 2017-07-17 ENCOUNTER — Ambulatory Visit (INDEPENDENT_AMBULATORY_CARE_PROVIDER_SITE_OTHER): Payer: Medicare Other | Admitting: Physician Assistant

## 2017-07-17 VITALS — BP 92/60 | HR 65 | Resp 16 | Ht 68.0 in | Wt 243.0 lb

## 2017-07-17 DIAGNOSIS — E785 Hyperlipidemia, unspecified: Secondary | ICD-10-CM | POA: Diagnosis not present

## 2017-07-17 DIAGNOSIS — R079 Chest pain, unspecified: Secondary | ICD-10-CM | POA: Diagnosis not present

## 2017-07-17 DIAGNOSIS — I671 Cerebral aneurysm, nonruptured: Secondary | ICD-10-CM

## 2017-07-17 DIAGNOSIS — I6200 Nontraumatic subdural hemorrhage, unspecified: Secondary | ICD-10-CM

## 2017-07-17 DIAGNOSIS — I10 Essential (primary) hypertension: Secondary | ICD-10-CM

## 2017-07-17 NOTE — Progress Notes (Signed)
Cardiology Office Note    Date:  07/17/2017   ID:  Jeff Weaver, DOB 10-11-1945, MRN [id number]  PCP:  [name], MD  Cardiologist: No primary care provider on file.  Chief Complaint  Patient presents with  . Advice Only    CAD, stent     History of Present Illness:  Jeff Weaver is a 72 y.o. 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  History of chest pain left heart cath 2015 normal coronary arteries with mild mid LAD myocardial bridging and moderately to severely elevated LVEDP with systemic hypertension and preserved LVEF 60-65%.  Nuclear stress test 2017 was normal EF 65%.  [name] 06/08/17 complaining of progressive more short of breath.  He has 14 steps at home and will get very short of breath by the time he got to the top associated with chest pressure radiating to his left shoulder.  Coronary chest CT was done and calcium score was 271 56 percentile for age and sex match control.  CT FFR analysis shows significant stenosis in the OM1 branch and she recommends cardiac catheterization.  Patient comes in today accompanied by his wife and daughter.  His short-term memory loss.  He continues to have chest tightness when going up stairs radiating down his left arm associated with shortness of breath.  He also has a different type of chest tightness when he lays down at night.  He says it originates in his lower left abdomen and feels like a box in the center of his chest described as a pinching sensation.  This can last up to an hour.  This is different than his exertional symptoms.  They are very concerned because of his previous cerebral hemorrhage.  They have many questions concerning him being on aspirin and possible stenting with need for antiplatelets.  Past Medical History:  Diagnosis  Date  . Aortic insufficiency   . Cerebral aneurysm    a. s/p clipping at [facility] in 8/13 c/b short-term memory loss, cerebral hemorrhage and seizure disorder s/p VP shunt.  . Cerebral hemorrhage ([place])   . Chronic diastolic CHF (congestive heart failure) ([place])   . CVA (cerebral infarction)   . Diverticulosis   . Enteritis (regional)    Dr Olevia Perches  . First degree AV block   . GI bleed   . HTN (hypertension)   . Hyperlipidemia   . Hypertensive heart disease   . IBS (irritable bowel syndrome)   . Orthostatic hypotension   . Pre-diabetes   . 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 Rheumatoid arthritis(714.0)    dxed in [employer] in 1980s  . Seizures ([place])   . Sinus bradycardia   . Sleep apnea     Past Surgical History:  Procedure Laterality Date  . CHOLECYSTECTOMY N/A 01/07/2016   Procedure: LAPAROSCOPIC CHOLECYSTECTOMY ;  Surgeon: Coralie Keens, MD;  Location: [facility];  Service: General;  Laterality: N/A;  . cns shunt  02/23/12  . COLONOSCOPY W/ POLYPECTOMY  1978   negative 2009, Dr Olevia Perches. Due 2014  . CRANIOTOMY  02/02/12   Dr Harvel Ricks, [place] of aneurysm  . 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 HERNIA REPAIR    . LEFT HEART CATHETERIZATION WITH CORONARY ANGIOGRAM N/A 11/26/2013   Procedure: LEFT  HEART CATHETERIZATION WITH CORONARY ANGIOGRAM;  Surgeon: [name], MD;  Location: [facility] CATH LAB;  Service: Cardiovascular;  Laterality: N/A;  . NOSE SURGERY    . SHOULDER SURGERY  1997  . TONSILLECTOMY AND ADENOIDECTOMY    . VENTRICULOPERITONEAL SHUNT  02-23-12   INSERTION OF RIGHT FRONTAL VENTRICULOPERITONEAL SHUNT WITH A CODMAN HAKIM PROGRAMMABLE VALVE    Current Medications: Current Meds  Medication Sig  . acetaminophen (TYLENOL) 650 MG CR tablet Take 650 mg by mouth every 8 (eight) hours as needed for pain.  Marland Kitchen aspirin EC 81 MG tablet Take 1 tablet (81 mg total) by mouth daily.  .  blood glucose meter kit and supplies KIT Dispense based on patient and insurance preference. Use up to four times daily as directed. (prediabetes hypoglycemia).  . cetirizine (ZYRTEC) 10 MG tablet Take 1 tablet (10 mg total) by mouth daily.  . Cholecalciferol (VITAMIN D PO) Take 1,000 mg by mouth every morning.  . clotrimazole (LOTRIMIN) 1 % cream Apply 1 application topically 2 (two) times daily. Use for 14 days  . fluticasone (FLONASE) 50 MCG/ACT nasal spray Place 2 sprays into both nostrils daily.  . Lacosamide 100 MG TABS Take 200 mg by mouth 2 (two) times daily.   Marland Kitchen levETIRAcetam (KEPPRA) 750 MG tablet Take 750 mg by mouth 2 (two) times daily.  Marland Kitchen LORazepam (ATIVAN) 1 MG tablet Take 1 mg by mouth every 8 (eight) hours.  Marland Kitchen losartan (COZAAR) 25 MG tablet Take 1 tablet (25 mg total) by mouth 2 (two) times daily.  . Miconazole Nitrate (LOTRIMIN AF JOCK ITCH POWDER) 2 % AERP Apply topically once or twice daily  . mirabegron ER (MYRBETRIQ) 25 MG TB24 tablet Take 25 mg by mouth every evening.   . Multiple Vitamins-Minerals (MULTIVITAMIN) tablet Take 1 tablet by mouth daily.  . NON FORMULARY CPAP Machine: authorized by [facility] in [place]  . pyridOXINE (VITAMIN B-6) 50 MG tablet 100 mg.   . Tamsulosin HCl (FLOMAX) 0.4 MG CAPS Take 0.8 mg by mouth at bedtime.   . thiamine 100 MG tablet Take 100 mg by mouth daily.  . vitamin B-12 (CYANOCOBALAMIN) 1000 MCG tablet Take 1,000 mcg by mouth daily.     Allergies:   Crestor [rosuvastatin]; Haloperidol; Simvastatin; Lisinopril; and Pravastatin   Social History   Socioeconomic History  . Marital status: Married    Spouse name: None  . Number of children: 1  . Years of education: None  . Highest education level: None  Social Needs  . Financial resource strain: None  . Food insecurity - worry: None  . Food insecurity - inability: None  . Transportation needs - medical: None  . Transportation needs - non-medical: None  Occupational History  . Occupation:  retired  Tobacco Use  . Smoking status: Former Smoker    Types: Pipe    Last attempt to quit: 07/04/2009    Years since quitting: 8.0  . Smokeless tobacco: Former Network engineer and Sexual Activity  . Alcohol use: Yes    Alcohol/week: 0.0 oz    Comment: Very Infrequently   . Drug use: No  . Sexual activity: None  Other Topics Concern  . None  Social History Narrative  . None     Family History:  The patient's family history includes COPD in his father; Coronary artery disease in his father; Dementia (age of onset: 74) in his sister; Diabetes in his brother and sister; Hepatitis in his mother.   ROS:   Please  see the history of present illness.    Review of Systems  Constitution: Positive for malaise/fatigue.  HENT: Negative.   Cardiovascular: Positive for dyspnea on exertion and leg swelling.  Respiratory: Positive for cough and snoring.   Endocrine: Negative.   Hematologic/Lymphatic: Bruises/bleeds easily.  Musculoskeletal: Negative.   Gastrointestinal: Negative.   Genitourinary: Negative.   Neurological: Negative.    All other systems reviewed and are negative.   PHYSICAL EXAM:   VS:  BP 92/60 (BP Location: Right Arm, Cuff Size: Normal)   Pulse 65   Resp 16   Ht _0  (1.727 m)   Wt 243 lb (110.2 kg)   SpO2 98%   BMI 36.95 kg/m   Physical Exam  GEN: Obese, in no acute distress  HEENT: normal  Neck: no JVD, carotid bruits, or masses Cardiac:RRR; no murmurs, rubs, or gallops  Respiratory:  clear to auscultation bilaterally, normal work of breathing GI: soft, nontender, nondistended, + BS Ext: Trace ankle edema without cyanosis, clubbing, Good distal pulses bilaterally MS: no deformity or atrophy  Skin: warm and dry, no rash Neuro:  Alert and Oriented x 3 Psych: euthymic mood, full affect  Wt Readings from Last 3 Encounters:  07/17/17 243 lb (110.2 kg)  06/08/17 262 lb (118.8 kg)  05/31/17 257 lb (116.6 kg)      Studies/Labs Reviewed:   EKG:  EKG is  not ordered today.     Recent Labs: 01/30/2017: TSH 1.46 05/30/2017: ALT 43; BUN 23; Hemoglobin 15.2; Platelets 178.0; Potassium 4.3; Sodium 138 07/10/2017: Creatinine, Ser 0.90   Lipid Panel    Component Value Date/Time   CHOL 186 01/30/2017 1158   CHOL 159 03/27/2013 0818   TRIG 170.0 (H) 01/30/2017 1158   TRIG 127 03/27/2013 0818   HDL 44.70 01/30/2017 1158   HDL 45 03/27/2013 0818   CHOLHDL 4 01/30/2017 1158   VLDL 34.0 01/30/2017 1158   LDLCALC 108 (H) 01/30/2017 1158   LDLCALC 89 03/27/2013 0818   LDLDIRECT 87.6 09/19/2012 1206    Additional studies/ records that were reviewed today include:   Left Main:  No significant stenosis.   2. LAD: Proximal LAD CT FFR: 0.9.  Mid LAD: 0.84.  Distal LAD: 0.83. 3. LCX: CT FFR 0.92. 4. OM1: 0.8. 5. RCA: CT FFR 0.94.   IMPRESSION: 1.  CT FFR analysis showed a significant stenosis in the OM1 branch.     Electronically Signed   By: Ena Dawley   On: 07/11/2017 16:56 IMPRESSION: 1. Coronary calcium score of 271. This was 66 percentile for age and sex matched control.   2. Normal coronary origin with right dominance.   3. Moderate CAD in the mid LAD and severe CAD in the OM1 branch, additional analysis with CT FFR will be performed.   4. Dilated pulmonary artery measuring 34 mm suggestive of pulmonary hypertension.       ASSESSMENT:    1. Essential hypertension   2. Chest pain, unspecified type   3. Subdural hemorrhage ([place])   4. Nonruptured cerebral aneurysm   5. Hyperlipidemia, unspecified hyperlipidemia type      PLAN:  In order of problems listed above:  Chest pain with abnormal coronary CT with FFR analysis showing a significant stenosis in OM1 branch.  Previously had nonobstructive disease in 2015.  Normal nuclear stress test EF 69% Reviewed with Dr. [name] in detail who recommends cardiac catheterization given his ongoing exertional symptoms.  Blood pressure is too low today to add any medications.  He  does have a history of cerebral hemorrhage after clipping in 2013.  Family will discuss with neurologist possible need for antiplatelet if stent is needed.  We will also contact neurology.  Dr. [name] suggests stent that will limit antiplatelet if needed.  Discussed with Dr. Angelena Form as well. I have reviewed the risks, indications, and alternatives to angioplasty and stenting with the patient. Risks include but are not limited to bleeding, infection, vascular injury, stroke, myocardial infection, arrhythmia, kidney injury, radiation-related injury in the case of prolonged fluoroscopy use, emergency cardiac surgery, and death. The patient understands the risks of serious complication is low (<8%) and he agrees to proceed.     Essential hypertension patient's blood pressure is low today which is unusual for him.  He actually ate a sausage biscuit before coming in here today.  He does have lower extremity edema which I told him to limit his salt intake.  Cannot add any medications.  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  Seizures began after these events.  Followed by Dr. Orpah Cobb, PhD, Pa-C at [facility]. Will contact them to see if patient would be cleared to take ASA 81 mg and Plavix or Brilinta if he needs a stent.  Hyperlipidemia intolerant to statins.  Will refer to lipid clinic for PCSK9 inhibitor  Medication Adjustments/Labs and Tests Ordered: Current medicines are reviewed at length with the patient today.  Concerns regarding medicines are outlined above.  Medication changes, Labs and Tests ordered today are listed in the Patient Instructions below. Patient Instructions  Medication Instructions:  Your physician recommends that you continue on your current medications as directed. Please refer to the Current Medication list given to you  today.   Labwork: Your physician recommends that you have  lab work today: bmet/cbc/ptt/pt/inr   Testing/Procedures:  Your provider has recommended a cardiac catherization  You are scheduled for a cardiac catheterization on Thursday, January 17 with Dr. Angelena Form or associate.  Please arrive at the [facility] (Main Entrance) at [facility] at [address], [place] Stay on Thursday, January 17 at 11:30 am.    Special note: Every effort is made to have your procedure done on time.   Please understand that emergencies sometimes delay a scheduled   procedure.  No food or drink after midnight on Wednesday, January 16.  You may take your morning medications with a sip of water on the day of your procedure.  Please take a baby aspirin (81 mg) on the morning of your procedure.   Medications to [place] for a one night stay -- bring personal belongings.  Bring a current list of your medications and current insurance cards.  You MUST have a responsible person to drive you home. Someone MUST be with you the first 24 hours after you arrive home or your discharge will be delayed. Wear clothes that are easy to get on and off and wear slip on shoes.    Coronary Angiogram A coronary angiogram, also called coronary angiography, is an X-ray procedure used to look at the arteries in the heart. In this procedure, a dye (contrast dye) is injected through a long, hollow tube (catheter). The catheter is about the size of a piece of cooked spaghetti and is inserted through your groin, wrist, or arm. The dye is injected into each  artery, and X-rays are then taken to show if there is a blockage in the arteries of your heart.  LET [facility] CARE PROVIDER KNOW ABOUT:  Any allergies you have, including allergies to shellfish or contrast dye.   All medicines you are taking, including vitamins, herbs, eye drops, creams, and over-the-counter medicines.   Previous  problems you or members of your family have had with the use of anesthetics.   Any blood disorders you have.   Previous surgeries you have had.  History of kidney problems or failure.   Other medical conditions you have.  RISKS AND COMPLICATIONS  Generally, a coronary angiogram is a safe procedure. However, about 1 person out of 1000 can have problems that may include:  Allergic reaction to the dye.  Bleeding/bruising from the access site or other locations.  Kidney injury, especially in people with impaired kidney function.  Stroke (rare).  Heart attack (rare).  Irregular rhythms (rare)  Death (rare)  BEFORE THE PROCEDURE   Do not eat or drink anything after midnight the night before the procedure or as directed by your health care provider.   Ask your health care provider about changing or stopping your regular medicines. This is especially important if you are taking diabetes medicines or blood thinners.  PROCEDURE  You may be given a medicine to help you relax (sedative) before the procedure. This medicine is given through an intravenous (IV) access tube that is inserted into one of your veins.   The area where the catheter will be inserted will be washed and shaved. This is usually done in the groin but may be done in the fold of your arm (near your elbow) or in the wrist.   A medicine will be given to numb the area where the catheter will be inserted (local anesthetic).   The health care provider will insert the catheter into an artery. The catheter will be guided by using a special type of X-ray (fluoroscopy) of the blood vessel being examined.   A special dye will then be injected into the catheter, and X-rays will be taken. The dye will help to show where any narrowing or blockages are located in the heart arteries.    AFTER THE PROCEDURE   If the procedure is done through the leg, you will be kept in bed lying flat for several hours. You will be  instructed to not bend or cross your legs.  The insertion site will be checked frequently.   The pulse in your feet or wrist will be checked frequently.   Additional blood tests, X-rays, and an electrocardiogram may be done.     Follow-Up: Your physician recommends that you keep your scheduled  follow-up appointment with Estella Husk, PA.   Any Other Special Instructions Will Be Listed Below (If Applicable).     If you need a refill on your cardiac medications before your next appointment, please call your pharmacy.      Signed, Ermalinda Barrios, PA-C  07/17/2017 1:24 PM    [facility] [place], [place], [place]  [address] Phone: [phone]; Fax: [phone]

## 2017-07-17 NOTE — Telephone Encounter (Signed)
Left message with Clarene Critchley at [facility], [place], office at [facility] /2 [phone].  Estella Husk, PA, scheduled pt for a cardiac cath on January 17 and would like to know if pt has to have a stent would it be ok if pt had to go on Asprin and plavix or brilinta.  Stated would mark this as high priority and get back to office.

## 2017-07-17 NOTE — Patient Instructions (Signed)
Medication Instructions:  Your physician recommends that you continue on your current medications as directed. Please refer to the Current Medication list given to you today.   Labwork: Your physician recommends that you have  lab work today: bmet/cbc/ptt/pt/inr   Testing/Procedures:  Your provider has recommended a cardiac catherization  You are scheduled for a cardiac catheterization on Thursday, January 17 with Dr. Angelena Form or associate.  Please arrive at the [facility] (Main Entrance) at [facility] at [address], [place] Stay on Thursday, January 17 at 11:30 am.    Special note: Every effort is made to have your procedure done on time.   Please understand that emergencies sometimes delay a scheduled   procedure.  No food or drink after midnight on Wednesday, January 16.  You may take your morning medications with a sip of water on the day of your procedure.  Please take a baby aspirin (81 mg) on the morning of your procedure.   Medications to [place] for a one night stay -- bring personal belongings.  Bring a current list of your medications and current insurance cards.  You MUST have a responsible person to drive you home. Someone MUST be with you the first 24 hours after you arrive home or your discharge will be delayed. Wear clothes that are easy to get on and off and wear slip on shoes.    Coronary Angiogram A coronary angiogram, also called coronary angiography, is an X-ray procedure used to look at the arteries in the heart. In this procedure, a dye (contrast dye) is injected through a long, hollow tube (catheter). The catheter is about the size of a piece of cooked spaghetti and is inserted through your groin, wrist, or arm. The dye is injected into each artery, and X-rays are then taken to show if there is a blockage in the arteries of your heart.  LET [facility] CARE PROVIDER KNOW ABOUT:  Any allergies you have,  including allergies to shellfish or contrast dye.   All medicines you are taking, including vitamins, herbs, eye drops, creams, and over-the-counter medicines.   Previous problems you or members of your family have had with the use of anesthetics.   Any blood disorders you have.   Previous surgeries you have had.  History of kidney problems or failure.   Other medical conditions you have.  RISKS AND COMPLICATIONS  Generally, a coronary angiogram is a safe procedure. However, about 1 person out of 1000 can have problems that may include:  Allergic reaction to the dye.  Bleeding/bruising from the access site or other locations.  Kidney injury, especially in people with impaired kidney function.  Stroke (rare).  Heart attack (rare).  Irregular rhythms (rare)  Death (rare)  BEFORE THE PROCEDURE   Do not eat or drink anything after midnight the night before the procedure or as directed by your health care provider.   Ask your health care provider about changing or stopping your regular medicines. This is especially important if you are taking diabetes medicines or blood thinners.  PROCEDURE  You may be given a medicine to help you relax (sedative) before the procedure. This medicine is given through an intravenous (IV) access tube that is inserted into one of your veins.   The area where the catheter will be inserted will be washed and shaved. This is usually done in the groin but may be done in the fold of your arm (  near your elbow) or in the wrist.   A medicine will be given to numb the area where the catheter will be inserted (local anesthetic).   The health care provider will insert the catheter into an artery. The catheter will be guided by using a special type of X-ray (fluoroscopy) of the blood vessel being examined.   A special dye will then be injected into the catheter, and X-rays will be taken. The dye will help to show where any narrowing or blockages  are located in the heart arteries.    AFTER THE PROCEDURE   If the procedure is done through the leg, you will be kept in bed lying flat for several hours. You will be instructed to not bend or cross your legs.  The insertion site will be checked frequently.   The pulse in your feet or wrist will be checked frequently.   Additional blood tests, X-rays, and an electrocardiogram may be done.     Follow-Up: Your physician recommends that you keep your scheduled  follow-up appointment with Estella Husk, PA.   Any Other Special Instructions Will Be Listed Below (If Applicable).     If you need a refill on your cardiac medications before your next appointment, please call your pharmacy.

## 2017-07-18 ENCOUNTER — Telehealth: Payer: Self-pay | Admitting: Cardiology

## 2017-07-18 LAB — CBC WITH DIFFERENTIAL/PLATELET
Basophils Absolute: 0 10*3/uL (ref 0.0–0.2)
Basos: 0 %
EOS (ABSOLUTE): 0.2 10*3/uL (ref 0.0–0.4)
Eos: 2 %
Hematocrit: 44.7 % (ref 37.5–51.0)
Hemoglobin: 15.7 g/dL (ref 13.0–17.7)
Immature Grans (Abs): 0 10*3/uL (ref 0.0–0.1)
Immature Granulocytes: 0 %
Lymphocytes Absolute: 1.7 10*3/uL (ref 0.7–3.1)
Lymphs: 20 %
MCH: 31.5 pg (ref 26.6–33.0)
MCHC: 35.1 g/dL (ref 31.5–35.7)
MCV: 90 fL (ref 79–97)
Monocytes Absolute: 0.8 10*3/uL (ref 0.1–0.9)
Monocytes: 9 %
Neutrophils Absolute: 5.8 10*3/uL (ref 1.4–7.0)
Neutrophils: 69 %
Platelets: 187 10*3/uL (ref 150–379)
RBC: 4.99 x10E6/uL (ref 4.14–5.80)
RDW: 13.9 % (ref 12.3–15.4)
WBC: 8.5 10*3/uL (ref 3.4–10.8)

## 2017-07-18 LAB — BASIC METABOLIC PANEL
BUN/Creatinine Ratio: 15 (ref 10–24)
BUN: 16 mg/dL (ref 8–27)
CO2: 28 mmol/L (ref 20–29)
Calcium: 9.9 mg/dL (ref 8.6–10.2)
Chloride: 98 mmol/L (ref 96–106)
Creatinine, Ser: 1.1 mg/dL (ref 0.76–1.27)
GFR calc Af Amer: 77 mL/min/{1.73_m2} (ref >59)
GFR calc non Af Amer: 66 mL/min/{1.73_m2} (ref >59)
Glucose: 87 mg/dL (ref 65–99)
Potassium: 4.9 mmol/L (ref 3.5–5.2)
Sodium: 140 mmol/L (ref 134–144)

## 2017-07-18 LAB — PROTIME-INR
INR: 1 (ref 0.8–1.2)
Prothrombin Time: 10.4 s (ref 9.1–12.0)

## 2017-07-18 LAB — APTT: aPTT: 27 s (ref 24–33)

## 2017-07-18 NOTE — Telephone Encounter (Deleted)
THIS MESSAGE BEING TYPED BELOW IS IN REGARDS TO DAUGHTER CALLING TO GO OVER CATH INSTRUCTIONS ON THE PT.  THIS IS NOT CORRELATED WITH MICHELLE LENZE PA-C CMA COMMUNICATING WITH [facility].  DIFFERENT CALLS ARE BEING INCORRECTLY ATTACHED TO 1 ENCOUNTER.  Spoke with the pts Daughter who just wanted to confirm the pts instructions for preparation of his upcoming cardiac cath.  Went over instructions thoroughly with the daughter, over the phone.

## 2017-07-18 NOTE — Telephone Encounter (Signed)
lvm to call back

## 2017-07-18 NOTE — Telephone Encounter (Signed)
Follow up    Melody with [facility] is returning call. Please call.

## 2017-07-18 NOTE — Telephone Encounter (Signed)
Follow up   Patients daughter says she has more questions regarding the Cardia catherization.  Please call

## 2017-07-18 NOTE — Telephone Encounter (Signed)
Spoke with Estella Husk PA-C and pts cardiac cath should be cancelled for this Thursday 07/20/17 with Dr. Angelena Form to do, for reasons of the pt needing full Neurology clearance before proceeding with scheduling of a cardiac cath.  Sharyn Lull spoke with the pts wife and Daughter about the rationale of why its unsafe to proceed with the pts cardiac cath.  Risks explained.  Per Sharyn Lull, the cath lab should be contacted and his cardiac cath should be cancelled.  Spoke with Crystal in the cath lab, and pts cath was cancelled for 07/20/17.  Also scheduled the pt to come in and see Dr [name], for 4-6 weeks out, per Estella Husk PA-C.  Pt scheduled to see Dr [name] for 09/08/17 at 1040.  Pts wife aware to have him arrive 15 mins prior to this appt.  Advised the pts wife that he should still follow-up with Sharyn Lull, as scheduled for 08/08/17.  Advised the pts wife that he should still come in for scheduled lipid clinic appt with the Pharmacist, for 07/28/17.  Wife verbalized understanding and agrees with this plan.  Wife more than gracious for all the assistance provided.

## 2017-07-18 NOTE — Telephone Encounter (Signed)
New Message  Jeff Weaver from [facility] is returning call in reference to previous message sent. Please call

## 2017-07-19 ENCOUNTER — Telehealth: Payer: Self-pay | Admitting: *Deleted

## 2017-07-19 NOTE — Telephone Encounter (Signed)
S/w with Estella Husk, PA, wanted result note faxed to [facility] to [facility] and Dr. Inocente Salles @ [phone] and # is [phone]. Faxed today.

## 2017-07-20 ENCOUNTER — Encounter (HOSPITAL_COMMUNITY): Payer: Self-pay

## 2017-07-20 ENCOUNTER — Ambulatory Visit (HOSPITAL_COMMUNITY): Admit: 2017-07-20 | Payer: Medicare Other | Admitting: Cardiovascular Disease

## 2017-07-20 SURGERY — LEFT HEART CATH AND CORONARY ANGIOGRAPHY
Anesthesia: LOCAL

## 2017-07-21 MED FILL — SHINGRIX 50 MCG SUS: 50 | 1 days supply | Qty: 1 | Fill #1

## 2017-07-25 ENCOUNTER — Telehealth: Payer: Self-pay | Admitting: Physician Assistant

## 2017-07-25 NOTE — Telephone Encounter (Signed)
New message   Patient spouse calling with questions about blood thinners. Please call

## 2017-07-28 ENCOUNTER — Ambulatory Visit (INDEPENDENT_AMBULATORY_CARE_PROVIDER_SITE_OTHER): Payer: Medicare Other | Admitting: Pharmacist

## 2017-07-28 ENCOUNTER — Encounter: Payer: Self-pay | Admitting: Pharmacist

## 2017-07-28 DIAGNOSIS — E785 Hyperlipidemia, unspecified: Secondary | ICD-10-CM | POA: Diagnosis not present

## 2017-07-28 MED ORDER — ROSUVASTATIN CALCIUM 5 MG PO TABS: ORAL_TABLET | ORAL | 3 refills | Status: DC

## 2017-07-28 MED FILL — ROSUVASTATIN CALCIUM 5 MG T: 5 | 35 days supply | Qty: 15 | Fill #0

## 2017-07-28 NOTE — Patient Instructions (Signed)
START Crestor (rosuvastatin) 48m three times a week (Monday, Wednesday, Friday)  We will call you in a few weeks to see how you are tolerating and to schedule a follow up.    Cholesterol Cholesterol is a fat. Your body needs a small amount of cholesterol. Cholesterol (plaque) may build up in your blood vessels (arteries). That makes you more likely to have a heart attack or stroke. You cannot feel your cholesterol level. Having a blood test is the only way to find out if your level is high. Keep your test results. Work with your doctor to keep your cholesterol at a good level. What do the results mean?  Total cholesterol is how much cholesterol is in your blood.  LDL is bad cholesterol. This is the type that can build up. Try to have low LDL.  HDL is good cholesterol. It cleans your blood vessels and carries LDL away. Try to have high HDL.  Triglycerides are fat that the body can store or burn for energy. What are good levels of cholesterol?  Total cholesterol below 200.  LDL below 100 is good for people who have health risks. LDL below 70 is good for people who have very high risks.  HDL above 40 is good. It is best to have HDL of 60 or higher.  Triglycerides below 150. How can I lower my cholesterol? Diet Follow your diet program as told by your doctor.  Choose fish, white meat chicken, or t[place] is roasted or baked. Try not to eat red meat, fried foods, sausage, or lunch meats.  Eat lots of fresh fruits and vegetables.  Choose whole grains, beans, pasta, potatoes, and cereals.  Choose olive oil, corn oil, or canola oil. Only use small amounts.  Try not to eat butter, mayonnaise, shortening, or palm kernel oils.  Try not to eat foods with trans fats.  Choose low-fat or nonfat dairy foods. ? Drink skim or nonfat milk. ? Eat low-fat or nonfat yogurt and cheeses. ? Try not to drink whole milk or cream. ? Try not to eat ice cream, egg yolks, or full-fat  cheeses.  Healthy desserts include angel food cake, ginger snaps, animal crackers, hard candy, popsicles, and low-fat or nonfat frozen yogurt. Try not to eat pastries, cakes, pies, and cookies.  Exercise Follow your exercise program as told by your doctor.  Be more active. Try gardening, walking, and taking the stairs.  Ask your doctor about ways that you can be more active.  Medicine  Take over-the-counter and prescription medicines only as told by your doctor. This information is not intended to replace advice given to you by your health care provider. Make sure you discuss any questions you have with your health care provider. Document Released: 09/16/2008 Document Revised: 01/20/2016 Document Reviewed: 12/31/2015 Elsevier Interactive Patient Education  2[employer]

## 2017-07-28 NOTE — Progress Notes (Signed)
Patient ID: Jeff Weaver                 DOB: 10-10-1945                    MRN: [id number]     HPI: Jeff LEAHY is a 72 y.o. male patient of Dr. [name] that presents today for lipid evaluation.  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 He has been experiencing chest tightness on exertion the last several weeks and Coronary chest CT was done and calcium score was 271 56 percentile for age and sex match control.  CT FFR analysis shows significant stenosis in the OM1 branch. Patient is pending cath and stent placement to OM1. He is awaiting neurology clearance.   He presents today for discussion of lipids. He and his wife have a lot of questions about everything that has been going on and how this relates to his cholesterol and overall cardiovascular health. They both state that things have changed significantly after his health declined in 2013 when he had the cerebral aneurysm.   He states that he has always struggled with knee issues since his time in the [employer]. He reports that he was told that some medications contributed to that those knee problems, but he does not recall if stopping the medications actually improved his knee pain at all. He also states he has seen that simvastatin caused N&V, but he absolutely does not recall that and his wife reports that she never noticed him being nauseated or vomiting with any medications.   We discussed his diet and exercise in relation to his cholesterol and also how cholesterol levels contribute to the plaques he has in his heart. They also have questions about antiplatelets and how they work with the stent and this was discussed at length as well. He and his wife also have questions about the need to hold antiplatelet medication for dental implants. Advised that he have  dental office to contact our office for further instruction.   Risk Factors: CVA, HTN, Pre-diabetes LDL Goal: <70  Current Medications: none Intolerances: atorvastatin 40m daily, pravastatin 432mdaily, Crestor, simvstatin, pravastatin - pain in knees  Diet: He eats and snacks frequently. He admits to potato chips. He admits that he has been eating fast food recently. He does occasionally have prime rib and rips. He does eat primarily chicken and fish.   Exercise: he has not been walking due to the weather and walking partner has been ill.   Family History: COPD in his father; Coronary artery disease in his father; Dementia (age of onset: 7078in his sister; Diabetes in his brother and sister; Hepatitis in his mother.  Social History: did use a pipe - but he quit in 2009. He has not really had a drink since 2013.   Labs: 01/30/17: TC 186, TG 170, HDL 44, LDL 108, nonHDL 141 - no medications  Past Medical History:  Diagnosis Date  . Aortic insufficiency   . Cerebral aneurysm    [facility]b short-term memory loss, cerebral hemorrhage and seizure disorder s/p VP shunt.  . [place]  . Chronic diastolic CHF (congestive heart failure) ([facility]  . CVA (cerebral infarction)   . Diverticulosis   . Enteritis (regional)    Dr BrOlevia Perches.  First degree AV block   . GI bleed   . HTN (hypertension)   . Hyperlipidemia   . Hypertensive heart disease   . IBS (irritable bowel syndrome)   . Orthostatic hypotension   . Pre-diabetes   . 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 Rheumatoid arthritis(714.0)    dxed in [employer] in 1980s  . Seizures ([place])   . Sinus bradycardia   . Sleep apnea     Current Outpatient Medications on File Prior to Visit  Medication Sig Dispense Refill  . acetaminophen (TYLENOL) 325 MG tablet Take 325-650 mg by mouth every 6 (six) hours as needed for headache.    Marland Kitchen amoxicillin (AMOXIL) 500 MG capsule Take 2,000 mg  by mouth as directed. Take 2000 mg by mouth 1 hour prior to dental appointments    . aspirin EC 81 MG tablet Take 1 tablet (81 mg total) by mouth daily. 90 tablet 3  . blood glucose meter kit and supplies KIT Dispense based on patient and insurance preference. Use up to four times daily as directed. (prediabetes hypoglycemia). 1 each 0  . calcium carbonate (TUMS - DOSED IN MG ELEMENTAL CALCIUM) 500 MG chewable tablet Chew 1-2 tablets by mouth daily as needed for indigestion or heartburn.    . cetirizine (ZYRTEC) 10 MG tablet Take 1 tablet (10 mg total) by mouth daily. 30 tablet 11  . cholecalciferol (VITAMIN D) 1000 units tablet Take 1,000 Units by mouth daily.    . clotrimazole (LOTRIMIN) 1 % cream Apply 1 application topically 2 (two) times daily. Use for 14 days (Patient taking differently: Apply 1 application topically 2 (two) times daily as needed (chafing). ) 30 g 0  . Emollient (ELTA) CREA Apply 1 application topically daily as needed (dry skin).    . fluticasone (FLONASE) 50 MCG/ACT nasal spray Place 2 sprays into both nostrils daily. (Patient taking differently: Place 2 sprays into both nostrils daily as needed for rhinitis. ) 48 g 6  . Lacosamide 100 MG TABS Take 200 mg by mouth 2 (two) times daily.     Marland Kitchen levETIRAcetam (KEPPRA) 500 MG tablet Take 750 mg by mouth 2 (two) times daily.    Marland Kitchen LORazepam (ATIVAN) 1 MG tablet Take 1 mg by mouth every 6 (six) hours as needed for seizure.     Marland Kitchen losartan (COZAAR) 25 MG tablet Take 1 tablet (25 mg total) by mouth 2 (two) times daily. 180 tablet 3  . Miconazole Nitrate (LOTRIMIN AF JOCK ITCH POWDER) 2 % AERP Apply topically once or twice daily (Patient taking differently: Apply 1 application topically 2 (two) times daily as needed (chafing). ) 130 g 2  . mirabegron ER (MYRBETRIQ) 25 MG TB24 tablet Take 25 mg by mouth every evening.     . Multiple Vitamins-Minerals (MULTIVITAMIN) tablet Take 1 tablet by mouth daily.    . NON FORMULARY CPAP Machine:  authorized by [facility] in [place] Glycol-Propyl Glycol (SYSTANE OP) Apply 1 drop to eye daily as needed (dry eyes).    . pyridOXINE (VITAMIN B-6) 100 MG tablet Take 100 mg by mouth daily.    . Tamsulosin HCl (FLOMAX) 0.4 MG CAPS Take 0.4 mg by mouth every evening.     . thiamine 100 MG tablet Take 100 mg by mouth daily.    . vitamin B-12 (CYANOCOBALAMIN) 1000 MCG tablet Take 1,000 mcg by mouth daily.    . [DISCONTINUED] acetaminophen (TYLENOL) 325 MG tablet Take 650 mg  by mouth every 6 (six) hours as needed.     No current facility-administered medications on file prior to visit.     Allergies  Allergen Reactions  . Crestor [Rosuvastatin]     Leg pain mainly in knees   . Haloperidol Rash and Other (See Comments)    Hallucinations, aggitation   . Simvastatin     2007: nausea & vomiting ; [facility] ( he does not remember such)  . Lisinopril     Cough   . Pravastatin     Rxed by [facility] 2009; "myositis" Ok to take per pt's wife/Dr. Linna Darner    Assessment/Plan: Hyperlipidemia: LDL is not at goal <70. After lengthy discussion (>45 minutes) on statin vs. PCSK9i vs. Zetia therapy, patient would prefer to do a retrial on a low dose of statin medication to see if we can get to goal. Start rosuvastatin 52m three times weekly. He and wife will monitor for any change in knee/muscle pain. Will plan to call to follow up in 3-4 weeks to determine tolerability. If tolerating well will plan to repeat lipid/hepatic panel in 8-12 weeks.    Thank you,  KLelan Pons APatterson Hammersmith [facility]  07/28/2017 7:10 AM

## 2017-07-31 NOTE — Telephone Encounter (Signed)
Patient's wife wants to know what the plan is to go forward with heart cath. Patient has an appointment  February 11 with Neurology. Patient will see Ermalinda Barrios PA on February 5. Informed patient's wife that she needs to have her husband see the neurologist first before they can make a plan for heart cath, per lab result notes. Will forward to Ermalinda Barrios PA for further advisement.

## 2017-07-31 NOTE — Telephone Encounter (Signed)
Left message for patient's wife to call back.  

## 2017-08-01 DIAGNOSIS — R279 Unspecified lack of coordination: Secondary | ICD-10-CM | POA: Diagnosis not present

## 2017-08-01 DIAGNOSIS — G3184 Mild cognitive impairment, so stated: Secondary | ICD-10-CM | POA: Diagnosis not present

## 2017-08-01 DIAGNOSIS — R270 Ataxia, unspecified: Secondary | ICD-10-CM | POA: Diagnosis not present

## 2017-08-01 DIAGNOSIS — E669 Obesity, unspecified: Secondary | ICD-10-CM | POA: Diagnosis not present

## 2017-08-01 NOTE — Telephone Encounter (Signed)
I spoke to patient's wife. Rescheduled patient's appointment from 2/5 to 2/12 per Michele's recommendation.  Pt has appt with Neuro 2/11.  She has questions she'd like to discuss with Selinda Eon.   I did offer to keep the 2/5 appt as well but she is fine with waiting until 08/15/17.

## 2017-08-01 NOTE — Telephone Encounter (Signed)
Patient did not specify she needed a phone call or to speak with you ASAP.  She voiced that as long as patient could keep appointment on 2/12, regardless of outcome of neuro visit, she would discuss concerns with you at that time.

## 2017-08-01 NOTE — Telephone Encounter (Signed)
Do I need to call her today or can she wait until the 12th?

## 2017-08-01 NOTE — Telephone Encounter (Signed)
they can cancel my appointment on February 5 and I can see him after they see neurology on the 11th.  I can see him on the 12th and set up the cath if he is cleared.

## 2017-08-08 ENCOUNTER — Ambulatory Visit: Payer: Medicare Other | Admitting: Physician Assistant

## 2017-08-14 DIAGNOSIS — Z9989 Dependence on other enabling machines and devices: Secondary | ICD-10-CM | POA: Diagnosis not present

## 2017-08-14 DIAGNOSIS — G4733 Obstructive sleep apnea (adult) (pediatric): Secondary | ICD-10-CM | POA: Diagnosis not present

## 2017-08-14 DIAGNOSIS — I10 Essential (primary) hypertension: Secondary | ICD-10-CM | POA: Diagnosis not present

## 2017-08-14 DIAGNOSIS — Z79899 Other long term (current) drug therapy: Secondary | ICD-10-CM | POA: Diagnosis not present

## 2017-08-14 DIAGNOSIS — E785 Hyperlipidemia, unspecified: Secondary | ICD-10-CM | POA: Diagnosis not present

## 2017-08-14 DIAGNOSIS — Z8679 Personal history of other diseases of the circulatory system: Secondary | ICD-10-CM | POA: Diagnosis not present

## 2017-08-14 DIAGNOSIS — G40209 Localization-related (focal) (partial) symptomatic epilepsy and epileptic syndromes with complex partial seizures, not intractable, without status epilepticus: Secondary | ICD-10-CM | POA: Diagnosis not present

## 2017-08-14 DIAGNOSIS — Z87891 Personal history of nicotine dependence: Secondary | ICD-10-CM | POA: Diagnosis not present

## 2017-08-14 DIAGNOSIS — Z8673 Personal history of transient ischemic attack (TIA), and cerebral infarction without residual deficits: Secondary | ICD-10-CM | POA: Diagnosis not present

## 2017-08-14 DIAGNOSIS — Z982 Presence of cerebrospinal fluid drainage device: Secondary | ICD-10-CM | POA: Diagnosis not present

## 2017-08-15 ENCOUNTER — Ambulatory Visit (INDEPENDENT_AMBULATORY_CARE_PROVIDER_SITE_OTHER): Payer: Medicare Other | Admitting: Physician Assistant

## 2017-08-15 ENCOUNTER — Encounter: Payer: Self-pay | Admitting: Physician Assistant

## 2017-08-15 VITALS — BP 128/76 | HR 63 | Ht 68.0 in | Wt 259.0 lb

## 2017-08-15 DIAGNOSIS — E785 Hyperlipidemia, unspecified: Secondary | ICD-10-CM | POA: Diagnosis not present

## 2017-08-15 DIAGNOSIS — I25118 Atherosclerotic heart disease of native coronary artery with other forms of angina pectoris: Secondary | ICD-10-CM

## 2017-08-15 DIAGNOSIS — I209 Angina pectoris, unspecified: Secondary | ICD-10-CM

## 2017-08-15 DIAGNOSIS — I63512 Cerebral infarction due to unspecified occlusion or stenosis of left middle cerebral artery: Secondary | ICD-10-CM | POA: Diagnosis not present

## 2017-08-15 DIAGNOSIS — I2510 Atherosclerotic heart disease of native coronary artery without angina pectoris: Secondary | ICD-10-CM | POA: Insufficient documentation

## 2017-08-15 DIAGNOSIS — I10 Essential (primary) hypertension: Secondary | ICD-10-CM | POA: Diagnosis not present

## 2017-08-15 DIAGNOSIS — R079 Chest pain, unspecified: Secondary | ICD-10-CM | POA: Diagnosis not present

## 2017-08-15 DIAGNOSIS — I6200 Nontraumatic subdural hemorrhage, unspecified: Secondary | ICD-10-CM

## 2017-08-15 MED ORDER — NITROGLYCERIN 0.4 MG SL SUBL: 0.4000 mg | SUBLINGUAL_TABLET | SUBLINGUAL | 3 refills | Status: DC | PRN

## 2017-08-15 MED FILL — NITROGLYCERIN 0.4 MG TAB SL: 0.4 | 25 days supply | Qty: 25 | Fill #0

## 2017-08-15 NOTE — Progress Notes (Signed)
Cardiology Office Note    Date:  08/15/2017   ID:  Jeff Weaver, DOB 1946/04/14, MRN [id number]  PCP:  [name], MD  Cardiologist: Ena Dawley, MD  Chief Complaint  Patient presents with  . Chest Pain    History of Present Illness:  Jeff Weaver is a 73 y.o. 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  History of chest pain left heart cath 2015 normal coronary arteries with mild mid LAD myocardial bridging and moderately to severely elevated LVEDP with systemic hypertension and preserved LVEF 60-65%.  Nuclear stress test 2017 was normal EF 65%.   Jeff Weaver 06/08/17 complaining of progressive more short of breath.  He has 14 steps at home and will get very short of breath by the time he got to the top associated with chest pressure radiating to his left shoulder.  Coronary chest CT was done and calcium score was 271 56 percentile for age and sex match control.  CT FFR analysis shows significant stenosis in the OM1 branch and she recommends cardiac catheterization.   I saw the patient 07/17/17 to be scheduled for cath.  He was having chest tightness when going upstairs that radiates down his left arm associated with shortness of breath.  He also has different types of chest tightness when he lays down at night that starts in his lower abdomen and feels like a box in the center of his chest and a pinching sensation.  This can last hours.  They were very concerned about his previous cerebral hemorrhage and having to take aspirin as well as antiplatelets if he required a stent.  Because of this we asked that they see his neurologist at [facility] to see if they agreed that he could take antiplatelets and aspirin if a stent was needed.  His blood pressure was too low to add any medications.  He was also  seen by lipid clinic for evaluation of possible PCSK9  inhibitor but wanted to try a statin again.  Was started on rosuvastatin 5 mg 3 times a week.  Patient saw Orpah Cobb, PA-C at [facility] yesterday.  They said they could not provide a letter of clearance for cardiology and that the family will need to decide the risk they prefer to take with stent placement and anticoagulation versus medical therapy.  They are concerned about his low blood pressure readings at home and possible syncopal episodes were he resulting in a fall if on anticoagulation.  Patient comes in today accompanied by his wife and daughter.  They have many.  Overall it sounds like the patient's chest pain has lessened.  He can walk up a flight of stairs most times without stopping.  Sometimes he stops at the top to catch his breath.  He is complaining of low blood pressures but the readings he brings from home are actually on the high side.  He is not allowing his wife to take his blood pressures on a daily basis.  There is been concern over what his blood pressure really is.  He also is scheduled to have dental implants which would need to be done before any possible stent placed.  Past Medical History:  Diagnosis Date  . Aortic insufficiency   . Cerebral aneurysm    a. s/p clipping at [facility] in 8/13 c/b short-term memory loss,  cerebral hemorrhage and seizure disorder s/p VP shunt.  . Cerebral hemorrhage ([place])   . Chronic diastolic CHF (congestive heart failure) ([place])   . CVA (cerebral infarction)   . Diverticulosis   . Enteritis (regional)    Dr Olevia Perches  . First degree AV block   . GI bleed   . HTN (hypertension)   . Hyperlipidemia   . Hypertensive heart disease   . IBS (irritable bowel syndrome)   . Orthostatic hypotension   . Pre-diabetes   . Regional enteritis of large intestine ([place]) since 1978  . Rheumatoid arthritis(714.0)    dxed in [employer] in 1980s  . Seizures ([place])   . Sinus bradycardia   . Sleep apnea      Past Surgical History:  Procedure Laterality Date  . CHOLECYSTECTOMY N/A 01/07/2016   Procedure: LAPAROSCOPIC CHOLECYSTECTOMY ;  Surgeon: Coralie Keens, MD;  Location: [facility];  Service: General;  Laterality: N/A;  . cns shunt  02/23/12  . COLONOSCOPY W/ POLYPECTOMY  1978   negative 2009, Dr Olevia Perches. Due 2014  . CRANIOTOMY  02/02/12   Dr Harvel Ricks, [place] of aneurysm  . 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 HERNIA REPAIR    . LEFT HEART CATHETERIZATION WITH CORONARY ANGIOGRAM N/A 11/26/2013   Procedure: LEFT HEART CATHETERIZATION WITH CORONARY ANGIOGRAM;  Surgeon: [name], MD;  Location[facility] C[facility] LAB;  Service: Cardiovascular;  Laterality: N/A;  . NOSE SURGERY    . SHOULDER SURGERY  1997  . TONSILLECTOMY AND ADENOIDECTOMY    . VENTRICULOPERITONEAL SHUNT  02-23-12   INSERTION OF RIGHT FRONTAL VENTRICULOPERITONEAL SHUNT WITH A CODMAN HAKIM PROGRAMMABLE VALVE    Current Medications: Current Meds  Medication Sig  . acetaminophen (TYLENOL) 325 MG tablet Take 325-650 mg by mouth every 6 (six) hours as needed for headache.  Marland Kitchen amoxicillin (AMOXIL) 500 MG capsule Take 2,000 mg by mouth as directed. Take 2000 mg by mouth 1 hour prior to dental appointments  . aspirin EC 81 MG tablet Take 1 tablet (81 mg total) by mouth daily.  . blood glucose meter kit and supplies KIT Dispense based on patient and insurance preference. Use up to four times daily as directed. (prediabetes hypoglycemia).  . calcium carbonate (TUMS - DOSED IN MG ELEMENTAL CALCIUM) 500 MG chewable tablet Chew 1-2 tablets by mouth daily as needed for indigestion or heartburn.  . cetirizine (ZYRTEC) 10 MG tablet Take 1 tablet (10 mg total) by mouth daily.  . cholecalciferol (VITAMIN D) 1000 units tablet Take 1,000 Units by mouth daily.  . clotrimazole (LOTRIMIN) 1 % cream Apply 1 application topically as directed. For skin irritation  . Emollient  (ELTA) CREA Apply 1 application topically daily as needed (dry skin).  . fluticasone (FLONASE) 50 MCG/ACT nasal spray Place 2 sprays into both nostrils as directed. For rhinitis  . Lacosamide 100 MG TABS Take 200 mg by mouth 2 (two) times daily.   Marland Kitchen levETIRAcetam (KEPPRA) 500 MG tablet Take 750 mg by mouth 2 (two) times daily.  Marland Kitchen LORazepam (ATIVAN) 1 MG tablet Take 1 mg by mouth every 6 (six) hours as needed for seizure.   Marland Kitchen losartan (COZAAR) 25 MG tablet Take 1 tablet (25 mg total) by mouth 2 (two) times daily.  . mirabegron ER (MYRBETRIQ) 25 MG TB24 tablet Take 25 mg by mouth every evening.   . Multiple Vitamins-Minerals (MULTIVITAMIN) tablet Take 1 tablet by mouth daily.  . NON FORMULARY  CPAP Machine: authorized by [place] in [place] Glycol-Propyl Glycol (SYSTANE OP) Apply 1 drop to eye daily as needed (dry eyes).  . pyridOXINE (VITAMIN B-6) 100 MG tablet Take 100 mg by mouth daily.  . rosuvastatin (CRESTOR) 5 MG tablet Take 1 tablet three times week and increase as tolerated  . Tamsulosin HCl (FLOMAX) 0.4 MG CAPS Take 0.4 mg by mouth every evening.   . thiamine 100 MG tablet Take 100 mg by mouth daily.  . vitamin B-12 (CYANOCOBALAMIN) 1000 MCG tablet Take 1,000 mcg by mouth daily.     Allergies:   Crestor [rosuvastatin]; Haloperidol; Simvastatin; Lisinopril; and Pravastatin   Social History   Socioeconomic History  . Marital status: Married    Spouse name: None  . Number of children: 1  . Years of education: None  . Highest education level: None  Social Needs  . Financial resource strain: None  . Food insecurity - worry: None  . Food insecurity - inability: None  . Transportation needs - medical: None  . Transportation needs - non-medical: None  Occupational History  . Occupation: retired  Tobacco Use  . Smoking status: Former Smoker    Types: Pipe    Last attempt to quit: 07/04/2009    Years since quitting: 8.1  . Smokeless tobacco: Former Network engineer and Sexual  Activity  . Alcohol use: Yes    Alcohol/week: 0.0 oz    Comment: Very Infrequently   . Drug use: No  . Sexual activity: None  Other Topics Concern  . None  Social History Narrative  . None     Family History:  The patient's family history includes COPD in his father; Coronary artery disease in his father; Dementia (age of onset: 4) in his sister; Diabetes in his brother and sister; Hepatitis in his mother.   ROS:   Please see the history of present illness.    Review of Systems  Constitution: Negative.  HENT: Negative.   Cardiovascular: Negative.   Respiratory: Positive for cough and snoring.   Endocrine: Negative.   Hematologic/Lymphatic: Negative.   Musculoskeletal: Positive for muscle cramps, muscle weakness and myalgias.  Gastrointestinal: Negative.   Genitourinary: Negative.   Neurological: Positive for loss of balance.       Short-term memory loss   All other systems reviewed and are negative.   PHYSICAL EXAM:   VS:  BP 128/76   Pulse 63   Ht _0  (1.727 m)   Wt 259 lb (117.5 kg)   SpO2 98%   BMI 39.38 kg/m   Physical Exam  GEN: Obese, in no acute distress  Neck: no JVD, carotid bruits, or masses Cardiac:RRR; positive S4, 2/6 systolic murmur at the left sternal border Respiratory:  clear to auscultation bilaterally, normal work of breathing GI: soft, nontender, nondistended, + BS Ext: without cyanosis, clubbing, or edema, Good distal pulses bilaterally Neuro:  Alert and Oriented x 3 Psych: euthymic mood, full affect  Wt Readings from Last 3 Encounters:  08/15/17 259 lb (117.5 kg)  07/17/17 243 lb (110.2 kg)  06/08/17 262 lb (118.8 kg)      Studies/Labs Reviewed:   EKG:  EKG is not ordered today.   Recent Labs: 01/30/2017: TSH 1.46 05/30/2017: ALT 43 07/17/2017: BUN 16; Creatinine, Ser 1.10; Hemoglobin 15.7; Platelets 187; Potassium 4.9; Sodium 140   Lipid Panel    Component Value Date/Time   CHOL 186 01/30/2017 1158   CHOL 159 03/27/2013 0818     TRIG  170.0 (H) 01/30/2017 1158   TRIG 127 03/27/2013 0818   HDL 44.70 01/30/2017 1158   HDL 45 03/27/2013 0818   CHOLHDL 4 01/30/2017 1158   VLDL 34.0 01/30/2017 1158   LDLCALC 108 (H) 01/30/2017 1158   LDLCALC 89 03/27/2013 0818   LDLDIRECT 87.6 09/19/2012 1206    Additional studies/ records that were reviewed today include:  Left Main:  No significant stenosis.   2. LAD: Proximal LAD CT FFR: 0.9.  Mid LAD: 0.84.  Distal LAD: 0.83. 3. LCX: CT FFR 0.92. 4. OM1: 0.8. 5. RCA: CT FFR 0.94.   IMPRESSION: 1.  CT FFR analysis showed a significant stenosis in the OM1 branch.     Electronically Signed   By: Ena Dawley   On: 07/11/2017 16:56 IMPRESSION: 1. Coronary calcium score of 271. This was 57 percentile for age and sex matched control.   2. Normal coronary origin with right dominance.   3. Moderate CAD in the mid LAD and severe CAD in the OM1 branch, additional analysis with CT FFR will be performed.   4. Dilated pulmonary artery measuring 34 mm suggestive of pulmonary hypertension.           ASSESSMENT:    1. Chest pain, unspecified type   2. Coronary artery disease involving native coronary artery of native heart with other form of angina pectoris ([place])   3. Essential hypertension   4. 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 Subdural hemorrhage ([place])   6. Hyperlipidemia, unspecified hyperlipidemia type      PLAN:  In order of problems listed above:  Chest pain seems to have lessened since last time he was here.  Cannot add long-acting nitrate at this time because of concern for low blood pressures.  Checking 24-hour blood pressure monitoring to see what his blood pressure is actually doing.  Follow-up with Dr. Meda Weaver next month to further discuss cardiac catheterization.  Patient to have his dental implants before this in case he requires a stent.  CAD with coronary CT calcium score of 271 which is 27 percentile for age and  sex match control, moderate CAD in the mid LAD and severe CAD in the OM1 branch, dilated pulmonary artery measuring 34 mm suggestive of pulmonary hypertension, FFR analysis shows a significant stenosis in the OM1 branch 0.8.  Cardiac cath recommended but at high risk because of prior subdural hematoma and neurology at [facility] is concerned about hypotension and possible fall.  Chest pain has been stable.  Will prescribe sublingual nitroglycerin as needed.  Follow-up with Dr. Meda Weaver to decide on cath.  Essential hypertension blood pressure is stable today.  Checking 24-hour monitor to assess where he is really at.  History of cerebral aneurysm status post craniotomy clipping of the neck ACA aneurysm 4854 complicated by left frontal temporal hemorrhagic stroke status post shunt placed 02/2012 for hydrocephalus.  Seizures began after these events.    Hyperlipidemia now on rosuvastatin 5 mg 3 times a week-so far he is tolerating it.  Chronic leg pain that is unchanged.  Medication Adjustments/Labs and Tests Ordered: Current medicines are reviewed at length with the patient today.  Concerns regarding medicines are outlined above.  Medication changes, Labs and Tests ordered today are listed in the Patient Instructions below. Patient Instructions  Medication Instructions:  Your provider recommends that you continue on your current medications as directed. Please refer to the Current Medication list given to you today.  Labwork: None  Testing/Procedures: Selinda Eon recommends you wear a 24 HOUR BLOOD PRESSURE MONITOR.  Follow-Up: Please keep your scheduled follow-up with Dr. Meda Weaver in March.   Any Other Special Instructions Will Be Listed Below (If Applicable).     If you need a refill on your cardiac medications before your next appointment, please call your pharmacy.      Sumner Boast, PA-C  08/15/2017 12:54 PM    [facility] [address],  [place], [place]  [address] Phone: [phone]; Fax: [phone]

## 2017-08-15 NOTE — Patient Instructions (Signed)
Medication Instructions:  Your provider recommends that you continue on your current medications as directed. Please refer to the Current Medication list given to you today.    Labwork: None  Testing/Procedures: Selinda Eon recommends you wear a 24 HOUR BLOOD PRESSURE MONITOR.  Follow-Up: Please keep your scheduled follow-up with Dr. [name] in March.   Any Other Special Instructions Will Be Listed Below (If Applicable).     If you need a refill on your cardiac medications before your next appointment, please call your pharmacy.

## 2017-08-17 ENCOUNTER — Telehealth: Payer: Self-pay | Admitting: *Deleted

## 2017-08-17 NOTE — Telephone Encounter (Signed)
   Primary Cardiologist: Ena Dawley, MD  Chart reviewed as part of pre-operative protocol coverage.   Selinda Eon, Can you provide recommendations?  You have seen the patient two days ago.  Seems ongoing discussion regarding cath.   Will also route to Dr. [name].   [place], [place] 08/17/2017, 2:40 PM

## 2017-08-17 NOTE — Telephone Encounter (Signed)
   [facility] Pre-operative Risk Assessment    Request for surgical clearance:  1. What type of surgery is being performed? Dental Implant  2. When is this surgery scheduled? 08/21/17  3. What type of clearance is required (medical clearance vs. Pharmacy clearance to hold med vs. Both)? Both clearance  4. Are there any medications that need to be held prior to surgery and how long?not specify   5. Practice name and name of physician performing surgery? Dr. Barbra Sarks and Junita Push.  6. What is your office phone and fax number? [phone], Fax # [phone].  7. Anesthesia type (None, local, MAC, general) ? Not specify (Possible local anesthetic).  8. Note: Pt Had dental Implant back in 2014. Any contraindications/concerns for pt. To have dental implant that are scheduled for 08/21/17.   Carollee Sires 08/17/2017, 9:33 AM  _________________________________________________________________   (provider comments below)

## 2017-08-18 NOTE — Telephone Encounter (Signed)
Spoke with the pt and wife and informed them that per Estella Husk PA-C, he is cleared for his dental procedure.  Will fax this clearance note to Dr. Essie Hart and Dr. Junita Push at [phone].  Both verbalized understanding and agrees with this plan.

## 2017-08-18 NOTE — Telephone Encounter (Signed)
Ivy, Can you let patient and wife and Dr's below know that he should be fine to proceed with dental implants. He's at higher risk because of his heart disease and prior stroke but overall this is a low risk surgery. No meds need to be held prior to surgery. Thanks, Ermalinda Barrios 1. What type of surgery is being performed? Dental Implant  2. When is this surgery scheduled? 08/21/17  3. What type of clearance is required (medical clearance vs. Pharmacy clearance to hold med vs. Both)? Both clearance  4. Are there any medications that need to be held prior to surgery and how long?not specify   5. Practice name and name of physician performing surgery? Dr. Barbra Sarks and Junita Push.  6. What is your office phone and fax number? [phone], Fax # [phone].  7. Anesthesia type (None, local, MAC, general) ? Not specify (Possible local anesthetic).  8. Note: Pt Had dental Implant back in 2014. Any contraindications/concerns for pt. To have dental implant that are scheduled for 08/21/17.   Carollee Sires 08/17/2017, 9:33 AM

## 2017-08-18 NOTE — Telephone Encounter (Signed)
Note routed thru [employer], PA-C 08/18/2017 3:13 PM Beeper [phone]

## 2017-08-21 MED FILL — ACETAMINOPHEN/COD #3 TABLET: 300-30 | 2 days supply | Qty: 16 | Fill #0

## 2017-08-21 MED FILL — AMOXICILLIN 875 MG TABLET: 875 | 8 days supply | Qty: 20 | Fill #0

## 2017-08-21 MED FILL — CHLORHEXIDINE 0.12% RINSE: 0.12 | 16 days supply | Qty: 473 | Fill #0

## 2017-08-24 DIAGNOSIS — Z961 Presence of intraocular lens: Secondary | ICD-10-CM | POA: Diagnosis not present

## 2017-08-25 ENCOUNTER — Ambulatory Visit: Payer: Medicare Other

## 2017-08-25 DIAGNOSIS — I10 Essential (primary) hypertension: Secondary | ICD-10-CM

## 2017-08-28 MED FILL — ROSUVASTATIN CALCIUM 5 MG T: 5 | 35 days supply | Qty: 15 | Fill #1

## 2017-08-30 ENCOUNTER — Telehealth: Payer: Self-pay | Admitting: *Deleted

## 2017-08-30 MED ORDER — HYDROCHLOROTHIAZIDE 25 MG PO TABS: 25.0000 mg | ORAL_TABLET | Freq: Every day | ORAL | 2 refills | Status: DC

## 2017-08-30 MED ORDER — AMLODIPINE BESYLATE 2.5 MG PO TABS: 2.5000 mg | ORAL_TABLET | Freq: Every day | ORAL | 3 refills | Status: DC

## 2017-08-30 NOTE — Telephone Encounter (Signed)
Spoke with the pt and wife and informed them both that Dr [name] reviewed his BP report and she advises that he continue his current regimen of losartan, and start new med HCTZ 25 mg po daily.  Confirmed the pharmacy of choice with the pt. Pt and wife both verbalized understanding and agrees with this plan.

## 2017-08-30 NOTE — Telephone Encounter (Signed)
Wife called back about the pts HCTZ ordered by Dr [name] this morning, for elevated BP readings on his BP monitor report.  Per the wife, she states she was reading on HCTZ and noted that it can cause seizures.  Wife states pt has a hx of having seizures.  Wife wanted to know if Dr [name] still recommends that he take this, or switch him to something different.  Informed the pts wife that I will clarify this with Dr [name], and follow-up with her shortly thereafter.  Wife verbalized understanding and agrees with this plan.

## 2017-08-30 NOTE — Telephone Encounter (Signed)
Spoke with Dr [name], and she now recommends that we d/c new start HCTZ and start him on low dose amlodipine 2.5 mg po daily.  Confirmed the pharmacy of choice with the pts wife.  Wife verbalized understanding and agrees with this plan.  Wife gracious for all the assistance provided.

## 2017-08-30 NOTE — Addendum Note (Signed)
Addended by: Nuala Alpha on: 08/30/2017 12:49 PM   Modules accepted: Orders

## 2017-09-05 ENCOUNTER — Telehealth: Payer: Self-pay | Admitting: Cardiology

## 2017-09-05 NOTE — Telephone Encounter (Signed)
Spoke with the pts wife and clarified with her that Dr [name] did not want the pt to start HCTZ, due to hx of seizures, she wanted him to start low dose amlodipine 2.5 mg po daily, instead.  Informed the pts wife that HCTZ was d/c'ed from the pts med list on 2/27, whe she informed us that the pt has a hx of seizures and she read that this med could cause this.  Advised the pts wife that the only new med start was amlodipine 2.5 mg po daily.  Wife verbalized understanding and agrees with this plan.

## 2017-09-05 NOTE — Telephone Encounter (Signed)
MRs.Spinney is calling because she received a message from the pharmacy about a Hydrochloride. Wants to verify if he is supposed to take Amlodipine instead of the Hydrochloride or take both . Please call

## 2017-09-08 ENCOUNTER — Ambulatory Visit (INDEPENDENT_AMBULATORY_CARE_PROVIDER_SITE_OTHER): Payer: Medicare Other | Admitting: Cardiology

## 2017-09-08 ENCOUNTER — Encounter: Payer: Self-pay | Admitting: Cardiology

## 2017-09-08 VITALS — BP 98/60 | HR 63 | Ht 68.0 in | Wt 262.0 lb

## 2017-09-08 DIAGNOSIS — I10 Essential (primary) hypertension: Secondary | ICD-10-CM

## 2017-09-08 DIAGNOSIS — E785 Hyperlipidemia, unspecified: Secondary | ICD-10-CM | POA: Diagnosis not present

## 2017-09-08 DIAGNOSIS — I110 Hypertensive heart disease with heart failure: Secondary | ICD-10-CM

## 2017-09-08 DIAGNOSIS — I25118 Atherosclerotic heart disease of native coronary artery with other forms of angina pectoris: Secondary | ICD-10-CM

## 2017-09-08 LAB — COMPREHENSIVE METABOLIC PANEL
ALT: 40 IU/L (ref 0–44)
AST: 25 IU/L (ref 0–40)
Albumin/Globulin Ratio: 2 (ref 1.2–2.2)
Albumin: 4.6 g/dL (ref 3.5–4.8)
Alkaline Phosphatase: 50 IU/L (ref 39–117)
BUN/Creatinine Ratio: 13 (ref 10–24)
BUN: 13 mg/dL (ref 8–27)
Bilirubin Total: 0.4 mg/dL (ref 0.0–1.2)
CO2: 27 mmol/L (ref 20–29)
Calcium: 9.7 mg/dL (ref 8.6–10.2)
Chloride: 98 mmol/L (ref 96–106)
Creatinine, Ser: 1.04 mg/dL (ref 0.76–1.27)
GFR calc Af Amer: 82 mL/min/{1.73_m2} (ref >59)
GFR calc non Af Amer: 71 mL/min/{1.73_m2} (ref >59)
Globulin, Total: 2.3 g/dL (ref 1.5–4.5)
Glucose: 107 mg/dL — ABNORMAL HIGH (ref 65–99)
Potassium: 5 mmol/L (ref 3.5–5.2)
Sodium: 140 mmol/L (ref 134–144)
Total Protein: 6.9 g/dL (ref 6.0–8.5)

## 2017-09-08 LAB — LIPID PANEL
Chol/HDL Ratio: 2.7 ratio (ref 0.0–5.0)
Cholesterol, Total: 128 mg/dL (ref 100–199)
HDL: 47 mg/dL (ref >39)
LDL Calculated: 56 mg/dL (ref 0–99)
Triglycerides: 125 mg/dL (ref 0–149)
VLDL Cholesterol Cal: 25 mg/dL (ref 5–40)

## 2017-09-08 LAB — TSH: TSH: 1.36 u[IU]/mL (ref 0.450–4.500)

## 2017-09-08 NOTE — Patient Instructions (Signed)
Medication Instructions:   Your physician recommends that you continue on your current medications as directed. Please refer to the Current Medication list given to you today.    Labwork:  TODAY--CMET, TSH, AND LIPIDS     You have been referred to NUTRITIONIST/DIETICIAN     Follow-Up:  4 MONTHS WITH DR [name]       If you need a refill on your cardiac medications before your next appointment, please call your pharmacy.

## 2017-09-08 NOTE — Progress Notes (Signed)
Cardiology Office Note    Date:  09/08/2017  ID:  Jeff Weaver, DOB May 14, 1946, MRN [id number] PCP:  [name], MD  Cardiologist:  [name]   Chief Complaint: f/u blood pressure, orthostatic hypotension  History of Present Illness:  Jeff Weaver is a 72 y.o. male with history of obesity, HTN, HLD, chronic diastolic CHF/hypertensive heart disease, prediabetes, cerebral aneurysm s/p clipping at [facility] in 8/13 c/b short-term memory loss, cerebral hemorrhage and seizure disorder s/p VP shunt, sleep apnea, mild AI, sinus bradycardia (baseline HR 50s, 1st degree AV block) who presents for f/u BP.  First seen by Dr. [name] 04/2013 for chest pain with normal nuclear stress test. Admitted 11/2013 for chest pain and elevated troponin (0.74) -> LHC 11/26/13 revealed angiographically normal coronary arteries, mild mid LAD myocardial bridging, moderate to severely elevated LVEDP - with systemic hypertension and preserved LVEF. Echo with EF 60-65%, LVH, pt did not follow up after that. More recently admitted 01/2016 for confusion/fever with possible PNA then diagnosis of acute cholecystitis s/p removal of gallbladder. He's since been followed by PCP for ongoing confusion and fatigue, with treatment of UTI. 2D echo in 01/2016 showed mod LVH, EF 60-65%, grade 2 DD, no RWMA, mild AI. Labs 03/2016 with Na 134, K 4.4, glucose 102, normal TSH, A1C and CBC.  He was seen by Robbie Lis PA-C 03/25/16 reporting fluctuating BP, dizziness upon standing, intermittent chest discomfort and LEE. His neurologist had requested cardiology manage his BP. He had reported BP 110/70s AM and [id number] systolic in PM. He was positive for orthostasis in the office with BP dropping from 120/74 layng to 95/63 standing with associated dizziness. Procardia was felt to be contributing to edema so this was stopped. Compression hose were recommended. Nuclear stress test was normal, EF >65%.  06/28/2016 - this is first time I'm seeing this nice  patient, he is accompanied by his wife and daughter, he feels slightly better, his neurologist is still trying to tune his seizure medications. He feels higher, he is experiencing mild orthostatic hypotension especially when he gets up first in the morning however significantly improved after stopping nifedipine and no presyncope or syncope. He has noticed some mild lower extremity edema after he was advised by his neurologist to increase his salt intake. He will have occasional chest pain that are not related to exertion. He hasn't been walking as he has been dealing with plantar fasciitis.   10/06/2016, 3 months follow-up, patient states that he has had no chest pain or shortness of breath however developed cough 2 weeks ago that is productive. He has been started on a second round of antibiotic doxycycline and cough medicine by his primary care physician yesterday. He denies any fever or chills. No palpitation or syncope. He continues to have orthostatic hypotension on a daily basis. No lower extremity edema.  06/08/2017 - the patient is coming after 6 months, again he has been suffering chronically from short memory impairment, his wife states that his diet hasn't been optimal in he eats quite a lot of salt. He has noticed that in the last few months he has been getting progressively more short of breath, they have 14 steps at home and when he gets to the top he gets short of breath sometimes it is associated with chest pressure radiating to his left shoulder. He underwent stress test a year ago that was negative for ischemia. He denies any palpitations or syncope.  09/08/2016, the patient underwent coronary CTA in  January of this year that showed a calcium score of 271, moderate coronary artery disease in mid LAD and severe CAD in OM1 branch, CT FFR showed 0.8 consistent with significant coronary artery disease. He was supposed to be scheduled for, however neurologist at [facility] was consulted because  of history of brain aneurysm in hemorrhagic stroke and he was strongly opposed to the idea of potentially adding another antiplatelet agent and can lead to another hemorrhagic stroke. The patient is significantly overweight, and not active, he used to walk but not during the winter, he states he gets short of breath when walking stairs, but his wife says she hasn't noticed that he denies any chest pain. He states in his own words that he eats whatever he can find. He admits not eating very healthy. He has been tolerating Crestor well.  Past Medical History:  Diagnosis Date  . Aortic insufficiency   . Cerebral aneurysm    a. s/p clipping at [facility] in 8/13 c/b short-term memory loss, cerebral hemorrhage and seizure disorder s/p VP shunt.  . Cerebral hemorrhage ([place])   . Chronic diastolic CHF (congestive heart failure) ([place])   . CVA (cerebral infarction)   . Diverticulosis   . Enteritis (regional)    Dr Olevia Perches  . First degree AV block   . GI bleed   . HTN (hypertension)   . Hyperlipidemia   . Hypertensive heart disease   . IBS (irritable bowel syndrome)   . Orthostatic hypotension   . Pre-diabetes   . 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 Rheumatoid arthritis(714.0)    dxed in [employer] in 1980s  . Seizures ([place])   . Sinus bradycardia   . Sleep apnea     Past Surgical History:  Procedure Laterality Date  . CHOLECYSTECTOMY N/A 01/07/2016   Procedure: LAPAROSCOPIC CHOLECYSTECTOMY ;  Surgeon: Coralie Keens, MD;  Location: [place];  Service: General;  Laterality: N/A;  . cns shunt  02/23/12  . COLONOSCOPY W/ POLYPECTOMY  1978   negative 2009, Dr Olevia Perches. Due 2014  . CRANIOTOMY  02/02/12   Dr Harvel Ricks, [place] of aneurysm  . 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 HERNIA REPAIR    . LEFT HEART CATHETERIZATION WITH CORONARY ANGIOGRAM N/A 11/26/2013   Procedure: LEFT HEART CATHETERIZATION WITH  CORONARY ANGIOGRAM;  Surgeon: [name], MD;  Location: [facility] CATH LAB;  Service: Cardiovascular;  Laterality: N/A;  . NOSE SURGERY    . SHOULDER SURGERY  1997  . TONSILLECTOMY AND ADENOIDECTOMY    . VENTRICULOPERITONEAL SHUNT  02-23-12   INSERTION OF RIGHT FRONTAL VENTRICULOPERITONEAL SHUNT WITH A CODMAN HAKIM PROGRAMMABLE VALVE    Current Medications: Current Outpatient Medications  Medication Sig Dispense Refill  . acetaminophen (TYLENOL) 325 MG tablet Take 325-650 mg by mouth every 6 (six) hours as needed for headache.    Marland Kitchen amLODipine (NORVASC) 2.5 MG tablet Take 1 tablet (2.5 mg total) by mouth daily. 30 tablet 3  . amoxicillin (AMOXIL) 500 MG capsule Take 2,000 mg by mouth as directed. Take 2000 mg by mouth 1 hour prior to dental appointments    . aspirin EC 81 MG tablet Take 1 tablet (81 mg total) by mouth daily. 90 tablet 3  . blood glucose meter kit and supplies KIT Dispense based on patient and insurance preference. Use up to four times daily as directed. (prediabetes hypoglycemia). 1 each 0  . calcium carbonate (TUMS -  DOSED IN MG ELEMENTAL CALCIUM) 500 MG chewable tablet Chew 1-2 tablets by mouth daily as needed for indigestion or heartburn.    . cetirizine (ZYRTEC) 10 MG tablet Take 1 tablet (10 mg total) by mouth daily. 30 tablet 11  . cholecalciferol (VITAMIN D) 1000 units tablet Take 1,000 Units by mouth daily.    . clotrimazole (LOTRIMIN) 1 % cream Apply 1 application topically as directed. For skin irritation    . Emollient (ELTA) CREA Apply 1 application topically daily as needed (dry skin).    . fluticasone (FLONASE) 50 MCG/ACT nasal spray Place 2 sprays into both nostrils as directed. For rhinitis    . Lacosamide 100 MG TABS Take 200 mg by mouth 2 (two) times daily.     Marland Kitchen levETIRAcetam (KEPPRA) 500 MG tablet Take 750 mg by mouth 2 (two) times daily.    Marland Kitchen LORazepam (ATIVAN) 1 MG tablet Take 1 mg by mouth every 6 (six) hours as needed for seizure.     Marland Kitchen losartan (COZAAR)  25 MG tablet Take 1 tablet (25 mg total) by mouth 2 (two) times daily. 180 tablet 3  . mirabegron ER (MYRBETRIQ) 25 MG TB24 tablet Take 25 mg by mouth every evening.     . Multiple Vitamins-Minerals (MULTIVITAMIN) tablet Take 1 tablet by mouth daily.    . nitroGLYCERIN (NITROSTAT) 0.4 MG SL tablet Place 1 tablet (0.4 mg total) under the tongue every 5 (five) minutes as needed for chest pain. 25 tablet 3  . NON FORMULARY CPAP Machine: authorized by [facility] in [place] Glycol-Propyl Glycol (SYSTANE OP) Apply 1 drop to eye daily as needed (dry eyes).    . pyridOXINE (VITAMIN B-6) 100 MG tablet Take 100 mg by mouth daily.    . rosuvastatin (CRESTOR) 5 MG tablet Take 1 tablet three times week and increase as tolerated 15 tablet 3  . Tamsulosin HCl (FLOMAX) 0.4 MG CAPS Take 0.4 mg by mouth every evening.     . thiamine 100 MG tablet Take 100 mg by mouth daily.    . vitamin B-12 (CYANOCOBALAMIN) 1000 MCG tablet Take 1,000 mcg by mouth daily.     No current facility-administered medications for this visit.      Allergies:   Crestor [rosuvastatin]; Haloperidol; Simvastatin; Lisinopril; and Pravastatin   Social History   Socioeconomic History  . Marital status: Married    Spouse name: None  . Number of children: 1  . Years of education: None  . Highest education level: None  Social Needs  . Financial resource strain: None  . Food insecurity - worry: None  . Food insecurity - inability: None  . Transportation needs - medical: None  . Transportation needs - non-medical: None  Occupational History  . Occupation: retired  Tobacco Use  . Smoking status: Former Smoker    Types: Pipe    Last attempt to quit: 07/04/2009    Years since quitting: 8.1  . Smokeless tobacco: Former Network engineer and Sexual Activity  . Alcohol use: Yes    Alcohol/week: 0.0 oz    Comment: Very Infrequently   . Drug use: No  . Sexual activity: None  Other Topics Concern  . None  Social History Narrative    . None     Family History:  The patient's family history includes COPD in his father; Coronary artery disease in his father; Dementia (age of onset: 52) in his sister; Diabetes in his brother and sister; Hepatitis in his  mother.   ROS:  + arthritis in multiple joints. Please see the history of present illness. All other systems are reviewed and otherwise negative.    PHYSICAL EXAM:   VS:  BP 98/60 (BP Location: Left Arm, Patient Position: Sitting, Cuff Size: Normal)   Pulse 63   Ht 5' 8"  (1.727 m)   Wt 262 lb (118.8 kg)   SpO2 95%   BMI 39.84 kg/m   BMI: Body mass index is 39.84 kg/m. GEN: Well nourished, well developed WM, obese, in no acute distress  HEENT: normocephalic, atraumatic Neck: no JVD, carotid bruits, or masses Cardiac: RRR; no murmurs, rubs, or gallops, no edema  Respiratory:  clear to auscultation bilaterally, normal work of breathing GI: soft, nontender, nondistended, + BS MS: no deformity or atrophy  Skin: warm and dry, no rash Neuro:  Alert and Oriented x 3, Strength and sensation are intact, follows commands Psych: euthymic mood, full affect  Wt Readings from Last 3 Encounters:  09/08/17 262 lb (118.8 kg)  08/15/17 259 lb (117.5 kg)  07/17/17 243 lb (110.2 kg)    Studies/Labs Reviewed:   EKG: Personally reviewed, shows sinus rhythm first degree AV block, they're all nines LVH, nonspecific ST-T wave abnormalities unchanged from prior.  Recent Labs: 01/30/2017: TSH 1.46 05/30/2017: ALT 43 07/17/2017: BUN 16; Creatinine, Ser 1.10; Hemoglobin 15.7; Platelets 187; Potassium 4.9; Sodium 140   Lipid Panel    Component Value Date/Time   CHOL 186 01/30/2017 1158   CHOL 159 03/27/2013 0818   TRIG 170.0 (H) 01/30/2017 1158   TRIG 127 03/27/2013 0818   HDL 44.70 01/30/2017 1158   HDL 45 03/27/2013 0818   CHOLHDL 4 01/30/2017 1158   VLDL 34.0 01/30/2017 1158   LDLCALC 108 (H) 01/30/2017 1158   LDLCALC 89 03/27/2013 0818   LDLDIRECT 87.6 09/19/2012 1206     Additional studies/ records that were reviewed today include: Summarized above  Coronary CTA: 17/2019  1. Coronary calcium score of 271. This was 31 percentile for age and sex matched control.  2. Normal coronary origin with right dominance.  3. Moderate CAD in the mid LAD and severe CAD in the OM1 branch, additional analysis with CT FFR will be performed.  4. Dilated pulmonary artery measuring 34 mm suggestive of pulmonary hypertension.  CT FFR: 1. Left Main:  No significant stenosis. 2. LAD: Proximal LAD CT FFR: 0.9.  Mid LAD: 0.84.  Distal LAD: 0.83. 3. LCX: CT FFR 0.92. 4. OM1: 0.8. 5. RCA: CT FFR 0.94.  IMPRESSION: 1.  CT FFR analysis showed a significant stenosis in the OM1 branch.  EKG performed today 09/08/2017 shows sinus bradycardia with first-degree AV block otherwise normal EKG and unchanged from prior.    ASSESSMENT & PLAN:   1. CAD 1.  CTA in January of this year that showed a calcium score of 271, moderate coronary artery disease in mid LAD and severe CAD in OM1 branch, CT FFR showed 0.8 consistent with significant coronary artery disease. We will continue aggressive medical management, I will recheck his lipids today and increase if needed, also recheck his LFTs., He has poor understanding about necessity of exercise and diet, and this was explained in length 2. He's advised to start exercising 5 times a week, changes diet, we will refer him to a dietitian. 2. Chronic diastolic CHF - resolved.  he appears euvolemic. 3. Obstructive sleep apnea - The patient has f/u coming up at the [place] soon to review his CPAP therapy and sleep  apnea. He is inconsistent with use which could also contribute to BP lability. 4. Hypertension - well controlled 5. Hyperlipidemia - on Crestor 3 times a week, will recheck today and increase it if needed.  Disposition: F/u with Dr. [name] in 6 months.  Medication Adjustments/Labs and Tests Ordered: Current medicines are  reviewed at length with the patient today.  Concerns regarding medicines are outlined above. Medication changes, Labs and Tests ordered today are summarized above and listed in the Patient Instructions accessible in Encounters.   Signed, Ena Dawley, MD 09/08/2017 11:16 AM    [address], [place], [place]  [address] Phone: [phone]; Fax: [phone]

## 2017-09-08 NOTE — Addendum Note (Signed)
Addended by: Nuala Alpha on: 09/08/2017 02:44 PM   Modules accepted: Orders

## 2017-09-13 ENCOUNTER — Telehealth: Payer: Self-pay | Admitting: Cardiology

## 2017-09-13 NOTE — Telephone Encounter (Signed)
Wife calling to give Dr [name] an update on the pts BP since she saw him at the last OV on 3/8.  Wife states that at his 3/8 OV he was dizzy and BP was on the low normal side at 98/60.  Wife states that the pt was started on amlodipine 2.5 mg po daily on 09/05/17, for abnormal 24 hour BP monitor.  Pt was originally started on HCTZ for this, but this had to be discontinued, for pt has a history of seizures and this med was contraindicated.  The decision was then made for him to start amlodipine 2.5 mg po daily. Wife states that since starting this, he has had episodes of dizziness, especially with change of positions. Wife states that they recorded his BP today, with positional changes, and noted are the values:  Sitting--107/62 Standing--91/67 Sat the pt back down after BP of 91/67  then took it sitting again and was 113/73.  Wife states that the pt complained he was dizzy, so that's why they took his BP.  Wife states that she held his amlodipine today.  Wife states that she thinks the amlodipine is to much, and would like further recommendations from Dr [name]. Advised the pts wife to continue holding his Amlodipine, hydrate the pt well, and take his pressure tomorrow morning, after meds and 1 hour after eating a meal, then call the office to report his reading and symptoms.  Advised the pts wife to make sure he changes positions slowly, allowing 3-5 min intervals between each position. Informed the pts wife that Dr [name] is out of the office today, but I will route this message to her for further review and recommendation, and someone from the office will follow-up with her or the pt thereafter. Wife verbalized understanding and agrees with this plan.

## 2017-09-13 NOTE — Telephone Encounter (Signed)
NEW MESSAGE  Patient spouse calling to report dizziness. Spouse states she stopped giving him amLODipine (NORVASC) 2.5 MG tablet.Please call   Pt c/o medication issue:  1. Name of Medication: amLODipine (NORVASC) 2.5 MG tablet  2. How are you currently taking this medication (dosage and times per day)? n/a  3. Are you having a reaction (difficulty breathing--STAT)? no  4. What is your medication issue? dizziness   STAT if patient feels like he/she is going to faint   1) Are you dizzy now? NO  2) Do you feel faint or have you passed out? NO  3) Do you have any other symptoms? DIZZY  4) Have you checked your HR and BP (record if available)? N/A

## 2017-09-15 NOTE — Telephone Encounter (Signed)
Left a message for the pt and wife to call back to endorse discontinuation of amlodipine, per Dr [name].

## 2017-09-15 NOTE — Telephone Encounter (Signed)
Please discontinue amlodipine

## 2017-09-18 NOTE — Telephone Encounter (Signed)
Spoke with the pts wife (on [place]), and informed her that Dr [name] recommends that the pt stop taking amlodipine. Will d/c from med list.  Wife verbalized understanding and agrees with this plan. Wife states that the pts pressure has been running on the low normal side.

## 2017-09-26 MED FILL — ROSUVASTATIN CALCIUM 5 MG T: 5 | 35 days supply | Qty: 15 | Fill #2

## 2017-10-09 MED FILL — LOTRIMIN AF POWDER 2 % AERP: 2 | 30 days supply | Qty: 133 | Fill #1

## 2017-10-16 NOTE — Progress Notes (Signed)
Subjective:    Patient ID: Jeff Weaver, male    DOB: 03/25/1946, 72 y.o.   MRN: [id number]  HPI The patient is here for follow up.  Hypertension: He is taking his medication daily. He is compliant with a low sodium diet.  He has SOB with going up stairs.  He denies SOB on a flat surface.  He has occasional chest pain. He has edema and palpitations and edema - not more than usual.   He denies regular headaches. He is not exercising regularly.  He does monitor his blood pressure at home - it is variable - 99-158 on recorded readings.    Prediabetes:  He is not compliant with a low sugar/carbohydrate diet.  He is not exercising regularly.  H/o cva, seizure d/o, memory difficulties, poor balance:  He is following with neurology.  He has not had any seizures since he was here last.  His balance is unchanged.  He uses a cane - sometimes this is just a conversation piece.    He is tired and feels depressed.  He is irritable at times. They are waiting for neurology to help set him up with a neuropsychiatrist.    Medications and allergies reviewed with patient and updated if appropriate.  Patient Active Problem List   Diagnosis Date Noted  . CAD (coronary artery disease) 08/15/2017  . Chest pain 07/17/2017  . DOE (dyspnea on exertion) 06/08/2017  . Dysphagia 05/31/2017  . Osteoporosis 01/30/2017  . Cellulitis 01/08/2017  . Superficial thrombophlebitis 01/08/2017  . Acute respiratory failure ([place]) 12/31/2016  . History of cerebral hemorrhage 12/28/2016  . SOB (shortness of breath) 10/05/2016  . Seborrheic dermatitis 07/25/2016  . Cognitive communication deficit 06/28/2016  . Hypertensive heart disease 06/28/2016  . Vitamin B 12 deficiency 03/16/2016  . Memory difficulties 03/13/2016  . Confusion 03/13/2016  . 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 Orthostatic hypotension 01/05/2016  . Benign prostatic hyperplasia with urinary obstruction 08/04/2014  . Urinary urgency  08/04/2014  . Gastroesophageal reflux disease 07/14/2014  . Polymyalgia rheumatica ([place]) 05/28/2014  . 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 Acute non-ST-elevation myocardial infarction ([place]) 11/25/2013  . SBE (subacute bacterial endocarditis) prophylaxis candidate 11/24/2013  . Aphasia 07/17/2013  . 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 seizures, not intractable, without status epilepticus ([place]) 07/17/2013  . Subdural hemorrhage ([place]) 07/17/2013  . 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[place]) 07/17/2013  . Pre-diabetes 03/26/2013  . Increased frequency of urination 10/31/2012  . Nocturia 10/31/2012  . History of cerebral aneurysm repair 10/18/2012  . History of stroke without residual deficits 10/18/2012  . Blepharitis of both eyes 09/27/2012  . 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 Seizure disorder ([place]) 07/23/2012  . Entropion 07/10/2012  . Hyperopia with astigmatism 07/10/2012  . Complex partial seizures with consciousness impaired ([place]) 04/16/2012  . Combined senile cataract 04/13/2012  . Presbyopia 04/13/2012  . 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 Cognitive safety issue 02/20/2012  . Nonruptured cerebral aneurysm 11/23/2011  . Obstructive sleep apnea syndrome 08/09/2011  . Hyperlipidemia 02/03/2009  . HTN (hypertension) 02/03/2009  . First degree atrioventricular block 02/03/2009  . OSTEOARTHRITIS 02/03/2009  . REGIONAL ENTERITIS, LARGE INTESTINE 04/17/2008    Current Outpatient Medications on File Prior to Visit  Medication Sig Dispense Refill  . acetaminophen (TYLENOL) 325 MG tablet Take 325-650 mg by mouth every 6 (six) hours as needed for headache.    Marland Kitchen amoxicillin (AMOXIL) 500 MG capsule Take 2,000 mg  by mouth as directed. Take 2000 mg by mouth 1 hour prior to dental appointments    . aspirin EC 81 MG tablet Take 1 tablet (81 mg total) by mouth daily. 90 tablet 3  . blood glucose meter kit and supplies KIT Dispense  based on patient and insurance preference. Use up to four times daily as directed. (prediabetes hypoglycemia). 1 each 0  . calcium carbonate (TUMS - DOSED IN MG ELEMENTAL CALCIUM) 500 MG chewable tablet Chew 1-2 tablets by mouth daily as needed for indigestion or heartburn.    . cetirizine (ZYRTEC) 10 MG tablet Take 1 tablet (10 mg total) by mouth daily. 30 tablet 11  . cholecalciferol (VITAMIN D) 1000 units tablet Take 1,000 Units by mouth daily.    . clotrimazole (LOTRIMIN) 1 % cream Apply 1 application topically as directed. For skin irritation    . Emollient (ELTA) CREA Apply 1 application topically daily as needed (dry skin).    . fluticasone (FLONASE) 50 MCG/ACT nasal spray Place 2 sprays into both nostrils as directed. For rhinitis    . Lacosamide 100 MG TABS Take 200 mg by mouth 2 (two) times daily.     Marland Kitchen levETIRAcetam (KEPPRA) 500 MG tablet Take 750 mg by mouth 2 (two) times daily.    Marland Kitchen LORazepam (ATIVAN) 1 MG tablet Take 1 mg by mouth every 6 (six) hours as needed for seizure.     Marland Kitchen losartan (COZAAR) 25 MG tablet Take 1 tablet (25 mg total) by mouth 2 (two) times daily. 180 tablet 3  . mirabegron ER (MYRBETRIQ) 25 MG TB24 tablet Take 25 mg by mouth every evening.     . Multiple Vitamins-Minerals (MULTIVITAMIN) tablet Take 1 tablet by mouth daily.    . nitroGLYCERIN (NITROSTAT) 0.4 MG SL tablet Place 1 tablet (0.4 mg total) under the tongue every 5 (five) minutes as needed for chest pain. 25 tablet 3  . NON FORMULARY CPAP Machine: authorized by [facility] in [place] Glycol-Propyl Glycol (SYSTANE OP) Apply 1 drop to eye daily as needed (dry eyes).    . pyridOXINE (VITAMIN B-6) 100 MG tablet Take 100 mg by mouth daily.    . rosuvastatin (CRESTOR) 5 MG tablet Take 1 tablet three times week and increase as tolerated 15 tablet 3  . Tamsulosin HCl (FLOMAX) 0.4 MG CAPS Take 0.4 mg by mouth every evening.     . thiamine 100 MG tablet Take 100 mg by mouth daily.    . vitamin B-12  (CYANOCOBALAMIN) 1000 MCG tablet Take 1,000 mcg by mouth daily.     No current facility-administered medications on file prior to visit.     Past Medical History:  Diagnosis Date  . Aortic insufficiency   . Cerebral aneurysm    a. s/p clipping at [facility] in 8/13 c/b short-term memory loss, cerebral hemorrhage and seizure disorder s/p VP shunt.  . Cerebral hemorrhage ([place])   . Chronic diastolic CHF (congestive heart failure) ([place])   . CVA (cerebral infarction)   . Diverticulosis   . Enteritis (regional)    Dr Olevia Perches  . First degree AV block   . GI bleed   . HTN (hypertension)   . Hyperlipidemia   . Hypertensive heart disease   . IBS (irritable bowel syndrome)   . Orthostatic hypotension   . Pre-diabetes   . Regional enteritis of large intestine ([place]) since 1978  . Rheumatoid arthritis(714.0)    dxed in [employer] in 1980s  . Seizures ([place])   .  Sinus bradycardia   . Sleep apnea     Past Surgical History:  Procedure Laterality Date  . CHOLECYSTECTOMY N/A 01/07/2016   Procedure: LAPAROSCOPIC CHOLECYSTECTOMY ;  Surgeon: Coralie Keens, MD;  Location: [place];  Service: General;  Laterality: N/A;  . cns shunt  02/23/12  . COLONOSCOPY W/ POLYPECTOMY  1978   negative 2009, Dr Olevia Perches. Due 2014  . CRANIOTOMY  02/02/12   Dr Harvel Ricks, [place] of aneurysm  . 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 HERNIA REPAIR    . LEFT HEART CATHETERIZATION WITH CORONARY ANGIOGRAM N/A 11/26/2013   Procedure: LEFT HEART CATHETERIZATION WITH CORONARY ANGIOGRAM;  Surgeon: [name], MD;  Location: [facility] CATH LAB;  Service: Cardiovascular;  Laterality: N/A;  . NOSE SURGERY    . SHOULDER SURGERY  1997  . TONSILLECTOMY AND ADENOIDECTOMY    . VENTRICULOPERITONEAL SHUNT  02-23-12   INSERTION OF RIGHT FRONTAL VENTRICULOPERITONEAL SHUNT WITH A CODMAN HAKIM PROGRAMMABLE VALVE    Social History   Socioeconomic History  . Marital status:  Married    Spouse name: Not on file  . Number of children: 1  . Years of education: Not on file  . Highest education level: Not on file  Occupational History  . Occupation: retired  Scientific laboratory technician  . Financial resource strain: Not on file  . Food insecurity:    Worry: Not on file    Inability: Not on file  . Transportation needs:    Medical: Not on file    Non-medical: Not on file  Tobacco Use  . Smoking status: Former Smoker    Types: Pipe    Last attempt to quit: 07/04/2009    Years since quitting: 8.2  . Smokeless tobacco: Former Network engineer and Sexual Activity  . Alcohol use: Yes    Alcohol/week: 0.0 oz    Comment: Very Infrequently   . Drug use: No  . Sexual activity: Not on file  Lifestyle  . Physical activity:    Days per week: Not on file    Minutes per session: Not on file  . Stress: Not on file  Relationships  . Social connections:    Talks on phone: Not on file    Gets together: Not on file    Attends religious service: Not on file    Active member of club or organization: Not on file    Attends meetings of clubs or organizations: Not on file    Relationship status: Not on file  Other Topics Concern  . Not on file  Social History Narrative  . Not on file    Family History  Problem Relation Age of Onset  . COPD Father   . Coronary artery disease Father        MI in 73s  . Hepatitis Mother   . Diabetes Sister   . Dementia Sister 67  . Diabetes Brother   . Colon cancer Neg Hx     Review of Systems  Constitutional: Negative for chills and fever.  Respiratory: Positive for cough and shortness of breath (with stairs). Negative for wheezing.   Cardiovascular: Positive for chest pain (occ - occurs at rest or activity), palpitations and leg swelling (chronic, no change).  Neurological: Positive for headaches (occ). Negative for dizziness, seizures and light-headedness.       Objective:   Vitals:   10/17/17 1438  BP: (!) 150/88  Pulse: (!) 59  Resp: 16  Temp: 97.8 F (36.6 C)  SpO2: 95%   BP Readings from Last 3 Encounters:  10/17/17 (!) 150/88  09/08/17 98/60  08/15/17 128/76   Wt Readings from Last 3 Encounters:  10/17/17 260 lb (117.9 kg)  09/08/17 262 lb (118.8 kg)  08/15/17 259 lb (117.5 kg)   Body mass index is 39.53 kg/m.   Physical Exam    Constitutional: Appears well-developed and well-nourished. No distress.  HENT:  Head: Normocephalic and atraumatic.  Neck: Neck supple. No tracheal deviation present. No thyromegaly present.  No cervical lymphadenopathy Cardiovascular: Normal rate, regular rhythm and normal heart sounds.   No murmur heard. No carotid bruit .  No edema Pulmonary/Chest: Effort normal and breath sounds normal. No respiratory distress. No has no wheezes. No rales.  Skin: Skin is warm and dry. Not diaphoretic.  Psychiatric: Normal mood and affect. Behavior is normal.      Assessment & Plan:    See Problem List for Assessment and Plan of chronic medical problems.

## 2017-10-17 ENCOUNTER — Ambulatory Visit (INDEPENDENT_AMBULATORY_CARE_PROVIDER_SITE_OTHER): Payer: Medicare Other | Admitting: Internal Medicine

## 2017-10-17 ENCOUNTER — Encounter: Payer: Self-pay | Admitting: Internal Medicine

## 2017-10-17 VITALS — BP 150/88 | HR 59 | Temp 97.8°F | Resp 16 | Wt 260.0 lb

## 2017-10-17 DIAGNOSIS — F329 Major depressive disorder, single episode, unspecified: Secondary | ICD-10-CM | POA: Diagnosis not present

## 2017-10-17 DIAGNOSIS — F32A Depression, unspecified: Secondary | ICD-10-CM | POA: Insufficient documentation

## 2017-10-17 DIAGNOSIS — I25118 Atherosclerotic heart disease of native coronary artery with other forms of angina pectoris: Secondary | ICD-10-CM | POA: Diagnosis not present

## 2017-10-17 DIAGNOSIS — I10 Essential (primary) hypertension: Secondary | ICD-10-CM | POA: Diagnosis not present

## 2017-10-17 DIAGNOSIS — R7303 Prediabetes: Secondary | ICD-10-CM | POA: Diagnosis not present

## 2017-10-17 LAB — POCT GLYCOSYLATED HEMOGLOBIN (HGB A1C): Hemoglobin A1C: 5.9

## 2017-10-17 NOTE — Patient Instructions (Addendum)
Your a1c was checked today.   Think about seeing a therapist or psychiatrist.    Medications reviewed and updated.  No changes recommended at this time.    Please followup in 6 months

## 2017-10-17 NOTE — Assessment & Plan Note (Signed)
Having depression - not new Neuro said they would help find a psychiatrist to help with his ptsd and depression - want to make sure his medication does not interact with his seizure meds Advised I can give names of psychiatrists  If they want - may also benefit from seeing a therapist

## 2017-10-17 NOTE — Assessment & Plan Note (Signed)
BP overall controlled, but it is variable Current regimen effective and well tolerated Continue current medications at current doses

## 2017-10-17 NOTE — Assessment & Plan Note (Signed)
a1c today --   Lab Results  Component Value Date   HGBA1C 5.9 10/17/2017   Low sugar / carb diet Stressed regular exercise, dec portions, weight loss

## 2017-10-19 ENCOUNTER — Ambulatory Visit: Payer: Medicare Other | Admitting: Internal Medicine

## 2017-11-03 DIAGNOSIS — Z982 Presence of cerebrospinal fluid drainage device: Secondary | ICD-10-CM | POA: Diagnosis not present

## 2017-11-03 DIAGNOSIS — G40219 Localization-related (focal) (partial) symptomatic epilepsy and epileptic syndromes with complex partial seizures, intractable, without status epilepticus: Secondary | ICD-10-CM | POA: Diagnosis not present

## 2017-11-03 DIAGNOSIS — Z79899 Other long term (current) drug therapy: Secondary | ICD-10-CM | POA: Diagnosis not present

## 2017-11-03 DIAGNOSIS — Q282 Arteriovenous malformation of cerebral vessels: Secondary | ICD-10-CM | POA: Diagnosis not present

## 2017-11-03 DIAGNOSIS — Z5181 Encounter for therapeutic drug level monitoring: Secondary | ICD-10-CM | POA: Diagnosis not present

## 2017-11-03 DIAGNOSIS — G40109 Localization-related (focal) (partial) symptomatic epilepsy and epileptic syndromes with simple partial seizures, not intractable, without status epilepticus: Secondary | ICD-10-CM | POA: Diagnosis not present

## 2017-11-07 MED FILL — ROSUVASTATIN CALCIUM 5 MG T: 5 | 35 days supply | Qty: 15 | Fill #3

## 2017-11-09 MED FILL — AMOXICILLIN 875 MG TABS: 875 | 5 days supply | Qty: 20 | Fill #0

## 2017-11-13 MED FILL — AMOXICILLIN 875 MG TABS: 875 | 5 days supply | Qty: 10 | Fill #0

## 2017-11-13 MED FILL — CHLORHEXIDINE 0.12% RINSE: 0.12 | 16 days supply | Qty: 473 | Fill #0

## 2017-12-04 ENCOUNTER — Ambulatory Visit: Payer: Medicare Other | Admitting: Cardiology

## 2017-12-11 ENCOUNTER — Other Ambulatory Visit: Payer: Self-pay | Admitting: *Deleted

## 2017-12-11 MED ORDER — ROSUVASTATIN CALCIUM 5 MG PO TABS: ORAL_TABLET | ORAL | 2 refills | Status: DC

## 2017-12-11 MED FILL — ROSUVASTATIN CALCIUM 5 MG T: 5 | 45 days supply | Qty: 45 | Fill #0

## 2017-12-11 NOTE — Telephone Encounter (Signed)
Pts wife made contact with the office to request for a 90 day supply of the pts rosuvastatin 5 mg po three times weekly, to be sent to his confirmed pharmacy of choice. Pts wife states that he is tolerating this dose appropriately, and that's why she wants a 90 day supply sent in.  Sent this request in for the pt and wife.

## 2017-12-15 NOTE — Telephone Encounter (Signed)
Error   Encounter entered in error

## 2017-12-21 MED FILL — AMOXICILLIN 500 MG CAPSULE: 500 | 5 days supply | Qty: 20 | Fill #0

## 2017-12-25 ENCOUNTER — Encounter: Payer: Self-pay | Admitting: Internal Medicine

## 2017-12-25 ENCOUNTER — Ambulatory Visit (INDEPENDENT_AMBULATORY_CARE_PROVIDER_SITE_OTHER): Payer: Medicare Other | Admitting: Internal Medicine

## 2017-12-25 VITALS — BP 152/84 | HR 64 | Temp 97.8°F | Resp 16 | Wt 268.0 lb

## 2017-12-25 DIAGNOSIS — M545 Low back pain, unspecified: Secondary | ICD-10-CM

## 2017-12-25 DIAGNOSIS — I25118 Atherosclerotic heart disease of native coronary artery with other forms of angina pectoris: Secondary | ICD-10-CM

## 2017-12-25 DIAGNOSIS — R0609 Other forms of dyspnea: Secondary | ICD-10-CM

## 2017-12-25 DIAGNOSIS — R0600 Dyspnea, unspecified: Secondary | ICD-10-CM

## 2017-12-25 NOTE — Progress Notes (Signed)
Subjective:    Patient ID: Jeff Weaver, male    DOB: 06/17/1946, 72 y.o.   MRN: [id number]  HPI The patient is here for an acute visit for acute back pain.   Back pain:  He took tylenol a few hours ago and the pain is better.  Three days ago he was laying down trying to fix the lawn mower.   He had difficulty getting up.  He started having back pain later than day.  His pain is located in the lower back on the right side and a little on the left side.  He has had difficulty getting up from a chair or from the bed.  His wife applied biofreeze. His wife thought there was a muscle tension/tightness that was causing the pain. He has been sleeping sitting up for the past few nights.  He has had a slipped disc in the past and this feels similar.    His pain level is currently 4/10.  He denies any numbness, tingling in the legs.  He does have some generalized weakness in the legs, but nothing is new in the past few days associated with the back pain.  Dyspnea on exertion: He continues to experience dyspnea on exertion.  He does see cardiology and has had full cardiac evaluation.  He does have some known cardiac disease and is unable to undergo a catheterization not cleared by neurology for blood thinners if needed.  His wife wonders about him seeing a lung doctor for further evaluation of possible COPD/emphysema.  He thinks his shortness of breath has gotten worse.  He notes his weight is probably contributing.  He is not exercising regularly.  He does not eat as good as he should.    Medications and allergies reviewed with patient and updated if appropriate.  Patient Active Problem List   Diagnosis Date Noted  . Depression 10/17/2017  . CAD (coronary artery disease) 08/15/2017  . Chest pain 07/17/2017  . DOE (dyspnea on exertion) 06/08/2017  . Dysphagia 05/31/2017  . Osteoporosis 01/30/2017  . History of cerebral hemorrhage 12/28/2016  . Seborrheic dermatitis 07/25/2016  . Cognitive  communication deficit 06/28/2016  . Hypertensive heart disease 06/28/2016  . Vitamin B 12 deficiency 03/16/2016  . Memory difficulties 03/13/2016  . Confusion 03/13/2016  . 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 Orthostatic hypotension 01/05/2016  . Benign prostatic hyperplasia with urinary obstruction 08/04/2014  . Urinary urgency 08/04/2014  . Gastroesophageal reflux disease 07/14/2014  . Polymyalgia rheumatica ([place]) 05/28/2014  . 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 Acute non-ST-elevation myocardial infarction ([place]) 11/25/2013  . SBE (subacute bacterial endocarditis) prophylaxis candidate 11/24/2013  . Aphasia 07/17/2013  . 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 seizures, not intractable, without status epilepticus ([place]) 07/17/2013  . Subdural hemorrhage ([place]) 07/17/2013  . 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[place]) 07/17/2013  . Pre-diabetes 03/26/2013  . Increased frequency of urination 10/31/2012  . Nocturia 10/31/2012  . History of cerebral aneurysm repair 10/18/2012  . History of stroke without residual deficits 10/18/2012  . Blepharitis of both eyes 09/27/2012  . 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 Seizure disorder ([place]) 07/23/2012  . Entropion 07/10/2012  . Hyperopia with astigmatism 07/10/2012  . Complex partial seizures with consciousness impaired ([place]) 04/16/2012  . Combined senile cataract 04/13/2012  . Presbyopia 04/13/2012  . 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 Cognitive safety issue 02/20/2012  . Nonruptured cerebral aneurysm 11/23/2011  . Obstructive sleep apnea syndrome  08/09/2011  . Hyperlipidemia 02/03/2009  . HTN (hypertension) 02/03/2009  . First degree atrioventricular block 02/03/2009  . OSTEOARTHRITIS 02/03/2009  . REGIONAL ENTERITIS, LARGE INTESTINE 04/17/2008    Current Outpatient Medications on File Prior to Visit  Medication Sig Dispense Refill  . acetaminophen (TYLENOL) 325 MG  tablet Take 325-650 mg by mouth every 6 (six) hours as needed for headache.    Marland Kitchen amoxicillin (AMOXIL) 500 MG capsule Take 2,000 mg by mouth as directed. Take 2000 mg by mouth 1 hour prior to dental appointments    . aspirin EC 81 MG tablet Take 1 tablet (81 mg total) by mouth daily. 90 tablet 3  . blood glucose meter kit and supplies KIT Dispense based on patient and insurance preference. Use up to four times daily as directed. (prediabetes hypoglycemia). 1 each 0  . cetirizine (ZYRTEC) 10 MG tablet Take 1 tablet (10 mg total) by mouth daily. 30 tablet 11  . cholecalciferol (VITAMIN D) 1000 units tablet Take 1,000 Units by mouth daily.    . clotrimazole (LOTRIMIN) 1 % cream Apply 1 application topically as directed. For skin irritation    . Emollient (ELTA) CREA Apply 1 application topically daily as needed (dry skin).    . fluticasone (FLONASE) 50 MCG/ACT nasal spray Place 2 sprays into both nostrils as directed. For rhinitis    . Lacosamide 100 MG TABS Take 200 mg by mouth 2 (two) times daily.     Marland Kitchen levETIRAcetam (KEPPRA) 500 MG tablet Take 1,000 mg by mouth 2 (two) times daily.     Marland Kitchen LORazepam (ATIVAN) 1 MG tablet Take 1 mg by mouth every 6 (six) hours as needed for seizure.     Marland Kitchen losartan (COZAAR) 25 MG tablet Take 1 tablet (25 mg total) by mouth 2 (two) times daily. 180 tablet 3  . mirabegron ER (MYRBETRIQ) 25 MG TB24 tablet Take 25 mg by mouth every evening.     . Multiple Vitamins-Minerals (MULTIVITAMIN) tablet Take 1 tablet by mouth daily.    . nitroGLYCERIN (NITROSTAT) 0.4 MG SL tablet Place 1 tablet (0.4 mg total) under the tongue every 5 (five) minutes as needed for chest pain. 25 tablet 3  . NON FORMULARY CPAP Machine: authorized by [facility] in [place] Glycol-Propyl Glycol (SYSTANE OP) Apply 1 drop to eye daily as needed (dry eyes).    . pyridOXINE (VITAMIN B-6) 100 MG tablet Take 100 mg by mouth daily.    . rosuvastatin (CRESTOR) 5 MG tablet Take 1 tablet three times week and  increase as tolerated 45 tablet 2  . Tamsulosin HCl (FLOMAX) 0.4 MG CAPS Take 0.4 mg by mouth every evening.     . thiamine 100 MG tablet Take 100 mg by mouth daily.    . vitamin B-12 (CYANOCOBALAMIN) 1000 MCG tablet Take 1,000 mcg by mouth daily.     No current facility-administered medications on file prior to visit.     Past Medical History:  Diagnosis Date  . Aortic insufficiency   . Cerebral aneurysm    a. s/p clipping at [facility] in 8/13 c/b short-term memory loss, cerebral hemorrhage and seizure disorder s/p VP shunt.  . Cerebral hemorrhage ([place])   . Chronic diastolic CHF (congestive heart failure) ([place])   . CVA (cerebral infarction)   . Diverticulosis   . Enteritis (regional)    Dr Olevia Perches  . First degree AV block   . GI bleed   . HTN (hypertension)   . Hyperlipidemia   .  Hypertensive heart disease   . IBS (irritable bowel syndrome)   . Orthostatic hypotension   . Pre-diabetes   . Regional enteritis of large intestine ([place]) since 1978  . Rheumatoid arthritis(714.0)    dxed in [employer] in 1980s  . Seizures ([place])   . Sinus bradycardia   . Sleep apnea     Past Surgical History:  Procedure Laterality Date  . CHOLECYSTECTOMY N/A 01/07/2016   Procedure: LAPAROSCOPIC CHOLECYSTECTOMY ;  Surgeon: Coralie Keens, MD;  Location: [place];  Service: General;  Laterality: N/A;  . cns shunt  02/23/12  . COLONOSCOPY W/ POLYPECTOMY  1978   negative 2009, Dr Olevia Perches. Due 2014  . CRANIOTOMY  02/02/12   Dr Harvel Ricks, [place] of aneurysm  . 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 HERNIA REPAIR    . LEFT HEART CATHETERIZATION WITH CORONARY ANGIOGRAM N/A 11/26/2013   Procedure: LEFT HEART CATHETERIZATION WITH CORONARY ANGIOGRAM;  Surgeon: [name], MD;  Location: [facility] CATH LAB;  Service: Cardiovascular;  Laterality: N/A;  . NOSE SURGERY    . SHOULDER SURGERY  1997  . TONSILLECTOMY AND ADENOIDECTOMY    .  VENTRICULOPERITONEAL SHUNT  02-23-12   INSERTION OF RIGHT FRONTAL VENTRICULOPERITONEAL SHUNT WITH A CODMAN HAKIM PROGRAMMABLE VALVE    Social History   Socioeconomic History  . Marital status: Married    Spouse name: Not on file  . Number of children: 1  . Years of education: Not on file  . Highest education level: Not on file  Occupational History  . Occupation: retired  Scientific laboratory technician  . Financial resource strain: Not on file  . Food insecurity:    Worry: Not on file    Inability: Not on file  . Transportation needs:    Medical: Not on file    Non-medical: Not on file  Tobacco Use  . Smoking status: Former Smoker    Types: Pipe    Last attempt to quit: 07/04/2009    Years since quitting: 8.4  . Smokeless tobacco: Former Network engineer and Sexual Activity  . Alcohol use: Yes    Alcohol/week: 0.0 oz    Comment: Very Infrequently   . Drug use: No  . Sexual activity: Not on file  Lifestyle  . Physical activity:    Days per week: Not on file    Minutes per session: Not on file  . Stress: Not on file  Relationships  . Social connections:    Talks on phone: Not on file    Gets together: Not on file    Attends religious service: Not on file    Active member of club or organization: Not on file    Attends meetings of clubs or organizations: Not on file    Relationship status: Not on file  Other Topics Concern  . Not on file  Social History Narrative  . Not on file    Family History  Problem Relation Age of Onset  . COPD Father   . Coronary artery disease Father        MI in 33s  . Hepatitis Mother   . Diabetes Sister   . Dementia Sister 73  . Diabetes Brother   . Colon cancer Neg Hx     Review of Systems  Constitutional: Positive for fatigue. Negative for chills and fever.  Respiratory: Positive for cough and shortness of breath. Negative for wheezing.   Cardiovascular: Positive for chest pain  and leg swelling. Negative for palpitations.  Musculoskeletal:  Positive for back pain and myalgias.  Skin: Negative for color change.  Neurological: Positive for weakness (Generalized, nonfocal). Negative for numbness.       Objective:   Vitals:   12/25/17 1547  BP: (!) 152/84  Pulse: 64  Resp: 16  Temp: 97.8 F (36.6 C)  SpO2: 94%   BP Readings from Last 3 Encounters:  12/25/17 (!) 152/84  10/17/17 (!) 150/88  09/08/17 98/60   Wt Readings from Last 3 Encounters:  12/25/17 268 lb (121.6 kg)  10/17/17 260 lb (117.9 kg)  09/08/17 262 lb (118.8 kg)   Body mass index is 40.75 kg/m.   Physical Exam  Constitutional: He appears well-developed and well-nourished. No distress.  HENT:  Head: Normocephalic and atraumatic.  Musculoskeletal: He exhibits edema (1+ bilateral lower extremity edema) and tenderness (Right psoas muscle, right SI joint and left psoas muscle tenderness). He exhibits no deformity.  Neurological: No sensory deficit (Normal sensation bilateral lower extremities). He exhibits normal muscle tone.  Skin: Skin is warm and dry. He is not diaphoretic.           Assessment & Plan:    See Problem List for Assessment and Plan of chronic medical problems.

## 2017-12-25 NOTE — Patient Instructions (Addendum)
For your back pain.   Take Tylenol 650 mg up to 4 times a day.  Use ice/heat and apply biofreeze.       A referral was ordered for pulmonary.

## 2017-12-25 NOTE — Assessment & Plan Note (Signed)
Chronic dyspnea on exertion, which she feels is getting worse Likely multifactorial We will follow-up with cardiology-he does have some known cardiac disease which may be contributing His weight and physical deconditioning are also likely contributing-advised him to revise his diet and working more on weight loss, try to slowly increase activity-walking Will refer to pulmonary to help evaluate if there is a component of COPD

## 2017-12-25 NOTE — Assessment & Plan Note (Signed)
No radiculopathy Likely muscular in nature Pain has improved-currently 4/10 Continue Tylenol 650 mg up to 4 times a day Ice/heat Continue Biofreeze Call if no improvement

## 2018-01-03 ENCOUNTER — Encounter: Payer: Self-pay | Admitting: Cardiology

## 2018-01-03 ENCOUNTER — Encounter: Payer: Self-pay | Admitting: *Deleted

## 2018-01-03 ENCOUNTER — Ambulatory Visit (INDEPENDENT_AMBULATORY_CARE_PROVIDER_SITE_OTHER): Payer: Medicare Other | Admitting: Cardiology

## 2018-01-03 VITALS — BP 104/70 | HR 54 | Ht 68.0 in | Wt 262.6 lb

## 2018-01-03 DIAGNOSIS — E785 Hyperlipidemia, unspecified: Secondary | ICD-10-CM | POA: Diagnosis not present

## 2018-01-03 DIAGNOSIS — R0600 Dyspnea, unspecified: Secondary | ICD-10-CM

## 2018-01-03 DIAGNOSIS — Z01812 Encounter for preprocedural laboratory examination: Secondary | ICD-10-CM | POA: Diagnosis not present

## 2018-01-03 DIAGNOSIS — I2510 Atherosclerotic heart disease of native coronary artery without angina pectoris: Secondary | ICD-10-CM

## 2018-01-03 DIAGNOSIS — R0609 Other forms of dyspnea: Secondary | ICD-10-CM

## 2018-01-03 DIAGNOSIS — I25118 Atherosclerotic heart disease of native coronary artery with other forms of angina pectoris: Secondary | ICD-10-CM

## 2018-01-03 LAB — BASIC METABOLIC PANEL
BUN/Creatinine Ratio: 12 (ref 10–24)
BUN: 13 mg/dL (ref 8–27)
CO2: 25 mmol/L (ref 20–29)
Calcium: 9.8 mg/dL (ref 8.6–10.2)
Chloride: 98 mmol/L (ref 96–106)
Creatinine, Ser: 1.06 mg/dL (ref 0.76–1.27)
GFR calc Af Amer: 80 mL/min/{1.73_m2} (ref >59)
GFR calc non Af Amer: 69 mL/min/{1.73_m2} (ref >59)
Glucose: 112 mg/dL — ABNORMAL HIGH (ref 65–99)
Potassium: 4.8 mmol/L (ref 3.5–5.2)
Sodium: 138 mmol/L (ref 134–144)

## 2018-01-03 LAB — CBC WITH DIFFERENTIAL/PLATELET
Basophils Absolute: 0 10*3/uL (ref 0.0–0.2)
Basos: 0 %
EOS (ABSOLUTE): 0.2 10*3/uL (ref 0.0–0.4)
Eos: 2 %
Hematocrit: 43.3 % (ref 37.5–51.0)
Hemoglobin: 15.1 g/dL (ref 13.0–17.7)
Immature Grans (Abs): 0 10*3/uL (ref 0.0–0.1)
Immature Granulocytes: 0 %
Lymphocytes Absolute: 2.1 10*3/uL (ref 0.7–3.1)
Lymphs: 20 %
MCH: 31.4 pg (ref 26.6–33.0)
MCHC: 34.9 g/dL (ref 31.5–35.7)
MCV: 90 fL (ref 79–97)
Monocytes Absolute: 0.9 10*3/uL (ref 0.1–0.9)
Monocytes: 8 %
Neutrophils Absolute: 7.4 10*3/uL — ABNORMAL HIGH (ref 1.4–7.0)
Neutrophils: 70 %
Platelets: 179 10*3/uL (ref 150–450)
RBC: 4.81 x10E6/uL (ref 4.14–5.80)
RDW: 13.2 % (ref 12.3–15.4)
WBC: 10.5 10*3/uL (ref 3.4–10.8)

## 2018-01-03 NOTE — H&P (View-Only) (Signed)
Cardiology Office Note    Date:  01/03/2018  ID:  BRADDOCK SERVELLON, DOB 1946-05-05, MRN [id number] PCP:  [name], MD  Cardiologist:  [name]   Chief Complaint: f/u blood pressure, orthostatic hypotension  History of Present Illness:  Jeff Weaver is a 72 y.o. male with history of obesity, HTN, HLD, chronic diastolic CHF/hypertensive heart disease, prediabetes, cerebral aneurysm s/p clipping at [facility] in 8/13 c/b short-term memory loss, cerebral hemorrhage and seizure disorder s/p VP shunt, sleep apnea, mild AI, sinus bradycardia (baseline HR 50s, 1st degree AV block) who presents for f/u BP.  09/08/2016, the patient underwent coronary CTA in January of this year that showed a calcium score of 271, moderate coronary artery disease in mid LAD and severe CAD in OM1 branch, CT FFR showed 0.8 consistent with significant coronary artery disease. He was supposed to be scheduled for, however neurologist at [facility] was consulted because of history of brain aneurysm in hemorrhagic stroke and he was strongly opposed to the idea of potentially adding another antiplatelet agent and can lead to another hemorrhagic stroke. The patient is significantly overweight, and not active, he used to walk but not during the winter, he states he gets short of breath when walking stairs, but his wife says she hasn't noticed that he denies any chest pain. He states in his own words that he eats whatever he can find. He admits not eating very healthy. He has been tolerating Crestor well.  January 03, 2018 -the patient is coming after 4 months, he states that he is shortness of breath is getting worse, he now gets short of breath after short distances.  This has been progressively worsening.  He denies any chest pain.  He uses CPAP machine at night.  Denies any lower extremity edema orthopnea or proximal nocturnal dyspnea.  He is frustrated as he used to be very physically active and now unable to to activities of daily living.   He has been compliant with his medications and has no side effects.  Past Medical History:  Diagnosis Date  . Aortic insufficiency   . Cerebral aneurysm    a. s/p clipping at [facility] in 8/13 c/b short-term memory loss, cerebral hemorrhage and seizure disorder s/p VP shunt.  . Cerebral hemorrhage ([place])   . Chronic diastolic CHF (congestive heart failure) ([place])   . CVA (cerebral infarction)   . Diverticulosis   . Enteritis (regional)    Dr Olevia Perches  . First degree AV block   . GI bleed   . HTN (hypertension)   . Hyperlipidemia   . Hypertensive heart disease   . IBS (irritable bowel syndrome)   . Orthostatic hypotension   . Pre-diabetes   . Regional enteritis of large intestine ([place]) since 1978  . Rheumatoid arthritis(714.0)    dxed in [employer] in 1980s  . Seizures ([place])   . Sinus bradycardia   . Sleep apnea     Past Surgical History:  Procedure Laterality Date  . CHOLECYSTECTOMY N/A 01/07/2016   Procedure: LAPAROSCOPIC CHOLECYSTECTOMY ;  Surgeon: Coralie Keens, MD;  Location: [facility];  Service: General;  Laterality: N/A;  . cns shunt  02/23/12  . COLONOSCOPY W/ POLYPECTOMY  1978   negative 2009, Dr Olevia Perches. Due 2014  . CRANIOTOMY  02/02/12   Dr Harvel Ricks, [place] of aneurysm  . 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 HERNIA REPAIR    . LEFT HEART  CATHETERIZATION WITH CORONARY ANGIOGRAM N/A 11/26/2013   Procedure: LEFT HEART CATHETERIZATION WITH CORONARY ANGIOGRAM;  Surgeon: [name], MD;  Location: [facility] CATH LAB;  Service: Cardiovascular;  Laterality: N/A;  . NOSE SURGERY    . SHOULDER SURGERY  1997  . TONSILLECTOMY AND ADENOIDECTOMY    . VENTRICULOPERITONEAL SHUNT  02-23-12   INSERTION OF RIGHT FRONTAL VENTRICULOPERITONEAL SHUNT WITH A CODMAN HAKIM PROGRAMMABLE VALVE    Current Medications: Current Outpatient Medications  Medication Sig Dispense Refill  . acetaminophen (TYLENOL) 325 MG tablet Take  325-650 mg by mouth every 6 (six) hours as needed for headache.    Marland Kitchen amoxicillin (AMOXIL) 500 MG capsule Take 2,000 mg by mouth as directed. Take 2000 mg by mouth 1 hour prior to dental appointments    . aspirin EC 81 MG tablet Take 1 tablet (81 mg total) by mouth daily. 90 tablet 3  . blood glucose meter kit and supplies KIT Dispense based on patient and insurance preference. Use up to four times daily as directed. (prediabetes hypoglycemia). 1 each 0  . cetirizine (ZYRTEC) 10 MG tablet Take 1 tablet (10 mg total) by mouth daily. 30 tablet 11  . cholecalciferol (VITAMIN D) 1000 units tablet Take 1,000 Units by mouth daily.    . clotrimazole (LOTRIMIN) 1 % cream Apply 1 application topically as directed. For skin irritation    . Emollient (ELTA) CREA Apply 1 application topically daily as needed (dry skin).    . fluticasone (FLONASE) 50 MCG/ACT nasal spray Place 2 sprays into both nostrils as directed. For rhinitis    . Lacosamide 100 MG TABS Take 200 mg by mouth 2 (two) times daily.     Marland Kitchen levETIRAcetam (KEPPRA) 500 MG tablet Take 1,000 mg by mouth 2 (two) times daily.     Marland Kitchen LORazepam (ATIVAN) 1 MG tablet Take 1 mg by mouth every 6 (six) hours as needed for seizure.     Marland Kitchen losartan (COZAAR) 25 MG tablet Take 1 tablet (25 mg total) by mouth 2 (two) times daily. 180 tablet 3  . mirabegron ER (MYRBETRIQ) 25 MG TB24 tablet Take 25 mg by mouth every evening.     . Multiple Vitamins-Minerals (MULTIVITAMIN) tablet Take 1 tablet by mouth daily.    . nitroGLYCERIN (NITROSTAT) 0.4 MG SL tablet Place 1 tablet (0.4 mg total) under the tongue every 5 (five) minutes as needed for chest pain. 25 tablet 3  . NON FORMULARY CPAP Machine: authorized by [facility] in [place] Glycol-Propyl Glycol (SYSTANE OP) Apply 1 drop to eye daily as needed (dry eyes).    . pyridOXINE (VITAMIN B-6) 100 MG tablet Take 100 mg by mouth daily.    . rosuvastatin (CRESTOR) 5 MG tablet Take 1 tablet three times week and increase as  tolerated 45 tablet 2  . Tamsulosin HCl (FLOMAX) 0.4 MG CAPS Take 0.4 mg by mouth every evening.     . thiamine 100 MG tablet Take 100 mg by mouth daily.    . vitamin B-12 (CYANOCOBALAMIN) 1000 MCG tablet Take 1,000 mcg by mouth daily.     No current facility-administered medications for this visit.      Allergies:   Crestor [rosuvastatin]; Haloperidol; Simvastatin; Lisinopril; and Pravastatin   Social History   Socioeconomic History  . Marital status: Married    Spouse name: Not on file  . Number of children: 1  . Years of education: Not on file  . Highest education level: Not on file  Occupational History  . Occupation: retired  Scientific laboratory technician  . Financial resource strain: Not on file  . Food insecurity:    Worry: Not on file    Inability: Not on file  . Transportation needs:    Medical: Not on file    Non-medical: Not on file  Tobacco Use  . Smoking status: Former Smoker    Types: Pipe    Last attempt to quit: 07/04/2009    Years since quitting: 8.5  . Smokeless tobacco: Former Network engineer and Sexual Activity  . Alcohol use: Yes    Alcohol/week: 0.0 oz    Comment: Very Infrequently   . Drug use: No  . Sexual activity: Not on file  Lifestyle  . Physical activity:    Days per week: Not on file    Minutes per session: Not on file  . Stress: Not on file  Relationships  . Social connections:    Talks on phone: Not on file    Gets together: Not on file    Attends religious service: Not on file    Active member of club or organization: Not on file    Attends meetings of clubs or organizations: Not on file    Relationship status: Not on file  Other Topics Concern  . Not on file  Social History Narrative  . Not on file     Family History:  The patient's family history includes COPD in his father; Coronary artery disease in his father; Dementia (age of onset: 26) in his sister; Diabetes in his brother and sister; Hepatitis in his mother.   ROS:  + arthritis in  multiple joints. Please see the history of present illness. All other systems are reviewed and otherwise negative.    PHYSICAL EXAM:   VS:  Ht _0  (1.727 m)   BMI 40.75 kg/m   BMI: Body mass index is 40.75 kg/m. GEN: Well nourished, well developed WM, obese, in no acute distress  HEENT: normocephalic, atraumatic Neck: no JVD, carotid bruits, or masses Cardiac: RRR; no murmurs, rubs, or gallops, no edema  Respiratory:  clear to auscultation bilaterally, normal work of breathing GI: soft, nontender, nondistended, + BS MS: no deformity or atrophy  Skin: warm and dry, no rash Neuro:  Alert and Oriented x 3, Strength and sensation are intact, follows commands Psych: euthymic mood, full affect  Wt Readings from Last 3 Encounters:  12/25/17 268 lb (121.6 kg)  10/17/17 260 lb (117.9 kg)  09/08/17 262 lb (118.8 kg)    Studies/Labs Reviewed:   EKG: Personally reviewed, shows sinus rhythm first degree AV block, they're all nines LVH, nonspecific ST-T wave abnormalities unchanged from prior.  Recent Labs: 07/17/2017: Hemoglobin 15.7; Platelets 187 09/08/2017: ALT 40; BUN 13; Creatinine, Ser 1.04; Potassium 5.0; Sodium 140; TSH 1.360   Lipid Panel    Component Value Date/Time   CHOL 128 09/08/2017 1202   CHOL 159 03/27/2013 0818   TRIG 125 09/08/2017 1202   TRIG 127 03/27/2013 0818   HDL 47 09/08/2017 1202   HDL 45 03/27/2013 0818   CHOLHDL 2.7 09/08/2017 1202   CHOLHDL 4 01/30/2017 1158   VLDL 34.0 01/30/2017 1158   LDLCALC 56 09/08/2017 1202   LDLCALC 89 03/27/2013 0818   LDLDIRECT 87.6 09/19/2012 1206    Additional studies/ records that were reviewed today include: Summarized above  Coronary CTA: 17/2019  1. Coronary calcium score of 271. This was 15 percentile for age and sex matched control.  2.  Normal coronary origin with right dominance.  3. Moderate CAD in the mid LAD and severe CAD in the OM1 branch, additional analysis with CT FFR will be performed.  4.  Dilated pulmonary artery measuring 34 mm suggestive of pulmonary hypertension.  CT FFR: 1. Left Main:  No significant stenosis. 2. LAD: Proximal LAD CT FFR: 0.9.  Mid LAD: 0.84.  Distal LAD: 0.83. 3. LCX: CT FFR 0.92. 4. OM1: 0.8. 5. RCA: CT FFR 0.94.  IMPRESSION: 1.  CT FFR analysis showed a significant stenosis in the OM1 branch.  EKG performed today 01/03/2018 shows sinus bradycardia with first-degree AV block otherwise normal EKG and unchanged from prior.    ASSESSMENT & PLAN:   1. CAD 1.  CTA in January of this year that showed a calcium score of 271, moderate coronary artery disease in mid LAD and severe CAD in OM1 branch, CT FFR in OM1 showed 0.8 consistent with significant coronary artery disease. We right aggressive medical management, however patient feels like he is limited by shortness of breath, that is progressively worsening.  At this point we will schedule left cardiac catheterization to assess his coronary artery disease and OM1 and mid LAD.  The patient understands that risks include but are not limited to stroke (1 in 1000), death (1 in 18), kidney failure [usually temporary] (1 in 500), bleeding (1 in 200), allergic reaction [possibly serious] (1 in 200), and agrees to proceed.  We will continue aspirin 81 mg daily, losartan 25 mg daily, Crestor 5 mg 3 times a week.  He is not on beta-blockers as his baseline pulse is 54 bpm.    2. Chronic diastolic CHF - resolved.  he appears euvolemic. 3. Obstructive sleep apnea - The patient has f/u coming up at the [place] soon to review his CPAP therapy and sleep apnea. He is inconsistent with use which could also contribute to BP lability. 4. Dyspnea on exertion -we will also order pulmonary function test, he is a former smoker. 5. Hypertension - well controlled Hyperlipidemia - on Crestor 3 times a week, his lipids were at goal in March 2019 and he had normal LFTs.  Disposition: F/u with Dr. [name] in 4 months.  Medication  Adjustments/Labs and Tests Ordered: Current medicines are reviewed at length with the patient today.  Concerns regarding medicines are outlined above. Medication changes, Labs and Tests ordered today are summarized above and listed in the Patient Instructions accessible in Encounters.   Signed, Ena Dawley, MD 01/03/2018 10:34 AM    [address], [place], [place]  [address] Phone: [phone]; Fax: [phone]

## 2018-01-03 NOTE — Progress Notes (Addendum)
Cardiology Office Note    Date:  01/03/2018  ID:  BRADDOCK SERVELLON, DOB 1946-05-05, MRN [id number] PCP:  [name], MD  Cardiologist:  [name]   Chief Complaint: f/u blood pressure, orthostatic hypotension  History of Present Illness:  Jeff Weaver is a 72 y.o. male with history of obesity, HTN, HLD, chronic diastolic CHF/hypertensive heart disease, prediabetes, cerebral aneurysm s/p clipping at [facility] in 8/13 c/b short-term memory loss, cerebral hemorrhage and seizure disorder s/p VP shunt, sleep apnea, mild AI, sinus bradycardia (baseline HR 50s, 1st degree AV block) who presents for f/u BP.  09/08/2016, the patient underwent coronary CTA in January of this year that showed a calcium score of 271, moderate coronary artery disease in mid LAD and severe CAD in OM1 branch, CT FFR showed 0.8 consistent with significant coronary artery disease. He was supposed to be scheduled for, however neurologist at [facility] was consulted because of history of brain aneurysm in hemorrhagic stroke and he was strongly opposed to the idea of potentially adding another antiplatelet agent and can lead to another hemorrhagic stroke. The patient is significantly overweight, and not active, he used to walk but not during the winter, he states he gets short of breath when walking stairs, but his wife says she hasn't noticed that he denies any chest pain. He states in his own words that he eats whatever he can find. He admits not eating very healthy. He has been tolerating Crestor well.  January 03, 2018 -the patient is coming after 4 months, he states that he is shortness of breath is getting worse, he now gets short of breath after short distances.  This has been progressively worsening.  He denies any chest pain.  He uses CPAP machine at night.  Denies any lower extremity edema orthopnea or proximal nocturnal dyspnea.  He is frustrated as he used to be very physically active and now unable to to activities of daily living.   He has been compliant with his medications and has no side effects.  Past Medical History:  Diagnosis Date  . Aortic insufficiency   . Cerebral aneurysm    a. s/p clipping at [facility] in 8/13 c/b short-term memory loss, cerebral hemorrhage and seizure disorder s/p VP shunt.  . Cerebral hemorrhage ([place])   . Chronic diastolic CHF (congestive heart failure) ([place])   . CVA (cerebral infarction)   . Diverticulosis   . Enteritis (regional)    Dr Olevia Perches  . First degree AV block   . GI bleed   . HTN (hypertension)   . Hyperlipidemia   . Hypertensive heart disease   . IBS (irritable bowel syndrome)   . Orthostatic hypotension   . Pre-diabetes   . Regional enteritis of large intestine ([place]) since 1978  . Rheumatoid arthritis(714.0)    dxed in [employer] in 1980s  . Seizures ([place])   . Sinus bradycardia   . Sleep apnea     Past Surgical History:  Procedure Laterality Date  . CHOLECYSTECTOMY N/A 01/07/2016   Procedure: LAPAROSCOPIC CHOLECYSTECTOMY ;  Surgeon: Coralie Keens, MD;  Location: [facility];  Service: General;  Laterality: N/A;  . cns shunt  02/23/12  . COLONOSCOPY W/ POLYPECTOMY  1978   negative 2009, Dr Olevia Perches. Due 2014  . CRANIOTOMY  02/02/12   Dr Harvel Ricks, [place] of aneurysm  . 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 HERNIA REPAIR    . LEFT HEART  CATHETERIZATION WITH CORONARY ANGIOGRAM N/A 11/26/2013   Procedure: LEFT HEART CATHETERIZATION WITH CORONARY ANGIOGRAM;  Surgeon: [name], MD;  Location: [facility] CATH LAB;  Service: Cardiovascular;  Laterality: N/A;  . NOSE SURGERY    . SHOULDER SURGERY  1997  . TONSILLECTOMY AND ADENOIDECTOMY    . VENTRICULOPERITONEAL SHUNT  02-23-12   INSERTION OF RIGHT FRONTAL VENTRICULOPERITONEAL SHUNT WITH A CODMAN HAKIM PROGRAMMABLE VALVE    Current Medications: Current Outpatient Medications  Medication Sig Dispense Refill  . acetaminophen (TYLENOL) 325 MG tablet Take  325-650 mg by mouth every 6 (six) hours as needed for headache.    Marland Kitchen amoxicillin (AMOXIL) 500 MG capsule Take 2,000 mg by mouth as directed. Take 2000 mg by mouth 1 hour prior to dental appointments    . aspirin EC 81 MG tablet Take 1 tablet (81 mg total) by mouth daily. 90 tablet 3  . blood glucose meter kit and supplies KIT Dispense based on patient and insurance preference. Use up to four times daily as directed. (prediabetes hypoglycemia). 1 each 0  . cetirizine (ZYRTEC) 10 MG tablet Take 1 tablet (10 mg total) by mouth daily. 30 tablet 11  . cholecalciferol (VITAMIN D) 1000 units tablet Take 1,000 Units by mouth daily.    . clotrimazole (LOTRIMIN) 1 % cream Apply 1 application topically as directed. For skin irritation    . Emollient (ELTA) CREA Apply 1 application topically daily as needed (dry skin).    . fluticasone (FLONASE) 50 MCG/ACT nasal spray Place 2 sprays into both nostrils as directed. For rhinitis    . Lacosamide 100 MG TABS Take 200 mg by mouth 2 (two) times daily.     Marland Kitchen levETIRAcetam (KEPPRA) 500 MG tablet Take 1,000 mg by mouth 2 (two) times daily.     Marland Kitchen LORazepam (ATIVAN) 1 MG tablet Take 1 mg by mouth every 6 (six) hours as needed for seizure.     Marland Kitchen losartan (COZAAR) 25 MG tablet Take 1 tablet (25 mg total) by mouth 2 (two) times daily. 180 tablet 3  . mirabegron ER (MYRBETRIQ) 25 MG TB24 tablet Take 25 mg by mouth every evening.     . Multiple Vitamins-Minerals (MULTIVITAMIN) tablet Take 1 tablet by mouth daily.    . nitroGLYCERIN (NITROSTAT) 0.4 MG SL tablet Place 1 tablet (0.4 mg total) under the tongue every 5 (five) minutes as needed for chest pain. 25 tablet 3  . NON FORMULARY CPAP Machine: authorized by [facility] in [place] Glycol-Propyl Glycol (SYSTANE OP) Apply 1 drop to eye daily as needed (dry eyes).    . pyridOXINE (VITAMIN B-6) 100 MG tablet Take 100 mg by mouth daily.    . rosuvastatin (CRESTOR) 5 MG tablet Take 1 tablet three times week and increase as  tolerated 45 tablet 2  . Tamsulosin HCl (FLOMAX) 0.4 MG CAPS Take 0.4 mg by mouth every evening.     . thiamine 100 MG tablet Take 100 mg by mouth daily.    . vitamin B-12 (CYANOCOBALAMIN) 1000 MCG tablet Take 1,000 mcg by mouth daily.     No current facility-administered medications for this visit.      Allergies:   Crestor [rosuvastatin]; Haloperidol; Simvastatin; Lisinopril; and Pravastatin   Social History   Socioeconomic History  . Marital status: Married    Spouse name: Not on file  . Number of children: 1  . Years of education: Not on file  . Highest education level: Not on file  Occupational History  . Occupation: retired  Scientific laboratory technician  . Financial resource strain: Not on file  . Food insecurity:    Worry: Not on file    Inability: Not on file  . Transportation needs:    Medical: Not on file    Non-medical: Not on file  Tobacco Use  . Smoking status: Former Smoker    Types: Pipe    Last attempt to quit: 07/04/2009    Years since quitting: 8.5  . Smokeless tobacco: Former Network engineer and Sexual Activity  . Alcohol use: Yes    Alcohol/week: 0.0 oz    Comment: Very Infrequently   . Drug use: No  . Sexual activity: Not on file  Lifestyle  . Physical activity:    Days per week: Not on file    Minutes per session: Not on file  . Stress: Not on file  Relationships  . Social connections:    Talks on phone: Not on file    Gets together: Not on file    Attends religious service: Not on file    Active member of club or organization: Not on file    Attends meetings of clubs or organizations: Not on file    Relationship status: Not on file  Other Topics Concern  . Not on file  Social History Narrative  . Not on file     Family History:  The patient's family history includes COPD in his father; Coronary artery disease in his father; Dementia (age of onset: 26) in his sister; Diabetes in his brother and sister; Hepatitis in his mother.   ROS:  + arthritis in  multiple joints. Please see the history of present illness. All other systems are reviewed and otherwise negative.    PHYSICAL EXAM:   VS:  Ht _0  (1.727 m)   BMI 40.75 kg/m   BMI: Body mass index is 40.75 kg/m. GEN: Well nourished, well developed WM, obese, in no acute distress  HEENT: normocephalic, atraumatic Neck: no JVD, carotid bruits, or masses Cardiac: RRR; no murmurs, rubs, or gallops, no edema  Respiratory:  clear to auscultation bilaterally, normal work of breathing GI: soft, nontender, nondistended, + BS MS: no deformity or atrophy  Skin: warm and dry, no rash Neuro:  Alert and Oriented x 3, Strength and sensation are intact, follows commands Psych: euthymic mood, full affect  Wt Readings from Last 3 Encounters:  12/25/17 268 lb (121.6 kg)  10/17/17 260 lb (117.9 kg)  09/08/17 262 lb (118.8 kg)    Studies/Labs Reviewed:   EKG: Personally reviewed, shows sinus rhythm first degree AV block, they're all nines LVH, nonspecific ST-T wave abnormalities unchanged from prior.  Recent Labs: 07/17/2017: Hemoglobin 15.7; Platelets 187 09/08/2017: ALT 40; BUN 13; Creatinine, Ser 1.04; Potassium 5.0; Sodium 140; TSH 1.360   Lipid Panel    Component Value Date/Time   CHOL 128 09/08/2017 1202   CHOL 159 03/27/2013 0818   TRIG 125 09/08/2017 1202   TRIG 127 03/27/2013 0818   HDL 47 09/08/2017 1202   HDL 45 03/27/2013 0818   CHOLHDL 2.7 09/08/2017 1202   CHOLHDL 4 01/30/2017 1158   VLDL 34.0 01/30/2017 1158   LDLCALC 56 09/08/2017 1202   LDLCALC 89 03/27/2013 0818   LDLDIRECT 87.6 09/19/2012 1206    Additional studies/ records that were reviewed today include: Summarized above  Coronary CTA: 17/2019  1. Coronary calcium score of 271. This was 15 percentile for age and sex matched control.  2.  Normal coronary origin with right dominance.  3. Moderate CAD in the mid LAD and severe CAD in the OM1 branch, additional analysis with CT FFR will be performed.  4.  Dilated pulmonary artery measuring 34 mm suggestive of pulmonary hypertension.  CT FFR: 1. Left Main:  No significant stenosis. 2. LAD: Proximal LAD CT FFR: 0.9.  Mid LAD: 0.84.  Distal LAD: 0.83. 3. LCX: CT FFR 0.92. 4. OM1: 0.8. 5. RCA: CT FFR 0.94.  IMPRESSION: 1.  CT FFR analysis showed a significant stenosis in the OM1 branch.  EKG performed today 01/03/2018 shows sinus bradycardia with first-degree AV block otherwise normal EKG and unchanged from prior.    ASSESSMENT & PLAN:   1. CAD 1.  CTA in January of this year that showed a calcium score of 271, moderate coronary artery disease in mid LAD and severe CAD in OM1 branch, CT FFR in OM1 showed 0.8 consistent with significant coronary artery disease. We right aggressive medical management, however patient feels like he is limited by shortness of breath, that is progressively worsening.  At this point we will schedule left cardiac catheterization to assess his coronary artery disease and OM1 and mid LAD.  The patient understands that risks include but are not limited to stroke (1 in 1000), death (1 in 18), kidney failure [usually temporary] (1 in 500), bleeding (1 in 200), allergic reaction [possibly serious] (1 in 200), and agrees to proceed.  We will continue aspirin 81 mg daily, losartan 25 mg daily, Crestor 5 mg 3 times a week.  He is not on beta-blockers as his baseline pulse is 54 bpm.    2. Chronic diastolic CHF - resolved.  he appears euvolemic. 3. Obstructive sleep apnea - The patient has f/u coming up at the [place] soon to review his CPAP therapy and sleep apnea. He is inconsistent with use which could also contribute to BP lability. 4. Dyspnea on exertion -we will also order pulmonary function test, he is a former smoker. 5. Hypertension - well controlled Hyperlipidemia - on Crestor 3 times a week, his lipids were at goal in March 2019 and he had normal LFTs.  Disposition: F/u with Dr. [name] in 4 months.  Medication  Adjustments/Labs and Tests Ordered: Current medicines are reviewed at length with the patient today.  Concerns regarding medicines are outlined above. Medication changes, Labs and Tests ordered today are summarized above and listed in the Patient Instructions accessible in Encounters.   Signed, Ena Dawley, MD 01/03/2018 10:34 AM    [address], [place], [place]  [address] Phone: [phone]; Fax: [phone]

## 2018-01-03 NOTE — Patient Instructions (Addendum)
Medication Instructions:   Your physician recommends that you continue on your current medications as directed. Please refer to the Current Medication list given to you today.    Labwork:  TODAY--CBC W DIFF AND BMET    Testing/Procedures:   Your physician has recommended that you have a pulmonary function test. Pulmonary Function Tests are a group of tests that measure how well air moves in and out of your lungs.    Your physician has requested that you have a cardiac catheterization. Cardiac catheterization is used to diagnose and/or treat various heart conditions. Doctors may recommend this procedure for a number of different reasons. The most common reason is to evaluate chest pain. Chest pain can be a symptom of coronary artery disease (CAD), and cardiac catheterization can show whether plaque is narrowing or blocking your heart's arteries. This procedure is also used to evaluate the valves, as well as measure the blood flow and oxygen levels in different parts of your heart. For further information please visit [email or web address]. Please follow instruction sheet, as given. YOUR CATH IS SCHEDULED FOR NEXT Friday 01/12/18 AT 8 AM FOR DR Martinique TO DO---PLEASE ARRIVE AT [facility] AT 8 AM.  PLEASE FOLLOW INSTRUCTION LETTER PROVIDED TO YOU TODAY TO PREPARE FOR YOUR CATH.     Follow-Up:  4 MONTHS WITH DR [name]       If you need a refill on your cardiac medications before your next appointment, please call your pharmacy.

## 2018-01-03 NOTE — Addendum Note (Signed)
Addended by: Doneta Public C on: 01/03/2018 04:41 PM   Modules accepted: Orders

## 2018-01-12 ENCOUNTER — Ambulatory Visit (HOSPITAL_COMMUNITY)
Admission: RE | Admit: 2018-01-12 | Discharge: 2018-01-12 | Disposition: A | Discharge status: Home / Self Care | Payer: Medicare Other | Source: Ambulatory Visit | Attending: Cardiology | Admitting: Cardiology

## 2018-01-12 ENCOUNTER — Encounter (HOSPITAL_COMMUNITY)
Admission: RE | Disposition: A | Discharge status: Home / Self Care | Payer: Self-pay | Source: Ambulatory Visit | Attending: Cardiology

## 2018-01-12 ENCOUNTER — Encounter (HOSPITAL_COMMUNITY): Payer: Self-pay | Admitting: Cardiology

## 2018-01-12 DIAGNOSIS — I2510 Atherosclerotic heart disease of native coronary artery without angina pectoris: Secondary | ICD-10-CM | POA: Diagnosis present

## 2018-01-12 DIAGNOSIS — I110 Hypertensive heart disease with heart failure: Secondary | ICD-10-CM | POA: Insufficient documentation

## 2018-01-12 DIAGNOSIS — K589 Irritable bowel syndrome without diarrhea: Secondary | ICD-10-CM | POA: Diagnosis not present

## 2018-01-12 DIAGNOSIS — Z6841 Body Mass Index (BMI) 40.0 and over, adult: Secondary | ICD-10-CM | POA: Diagnosis not present

## 2018-01-12 DIAGNOSIS — Z8249 Family history of ischemic heart disease and other diseases of the circulatory system: Secondary | ICD-10-CM | POA: Insufficient documentation

## 2018-01-12 DIAGNOSIS — I10 Essential (primary) hypertension: Secondary | ICD-10-CM | POA: Diagnosis present

## 2018-01-12 DIAGNOSIS — Z8673 Personal history of transient ischemic attack (TIA), and cerebral infarction without residual deficits: Secondary | ICD-10-CM | POA: Insufficient documentation

## 2018-01-12 DIAGNOSIS — E669 Obesity, unspecified: Secondary | ICD-10-CM | POA: Insufficient documentation

## 2018-01-12 DIAGNOSIS — I441 Atrioventricular block, second degree: Secondary | ICD-10-CM | POA: Insufficient documentation

## 2018-01-12 DIAGNOSIS — R0609 Other forms of dyspnea: Secondary | ICD-10-CM | POA: Diagnosis not present

## 2018-01-12 DIAGNOSIS — Z982 Presence of cerebrospinal fluid drainage device: Secondary | ICD-10-CM | POA: Diagnosis not present

## 2018-01-12 DIAGNOSIS — G40909 Epilepsy, unspecified, not intractable, without status epilepticus: Secondary | ICD-10-CM | POA: Diagnosis not present

## 2018-01-12 DIAGNOSIS — R0600 Dyspnea, unspecified: Secondary | ICD-10-CM

## 2018-01-12 DIAGNOSIS — G4733 Obstructive sleep apnea (adult) (pediatric): Secondary | ICD-10-CM | POA: Insufficient documentation

## 2018-01-12 DIAGNOSIS — I25118 Atherosclerotic heart disease of native coronary artery with other forms of angina pectoris: Secondary | ICD-10-CM

## 2018-01-12 DIAGNOSIS — K5010 Crohn's disease of large intestine without complications: Secondary | ICD-10-CM | POA: Insufficient documentation

## 2018-01-12 DIAGNOSIS — Z7951 Long term (current) use of inhaled steroids: Secondary | ICD-10-CM | POA: Insufficient documentation

## 2018-01-12 DIAGNOSIS — R079 Chest pain, unspecified: Secondary | ICD-10-CM | POA: Diagnosis present

## 2018-01-12 DIAGNOSIS — M069 Rheumatoid arthritis, unspecified: Secondary | ICD-10-CM | POA: Insufficient documentation

## 2018-01-12 DIAGNOSIS — I5032 Chronic diastolic (congestive) heart failure: Secondary | ICD-10-CM | POA: Insufficient documentation

## 2018-01-12 DIAGNOSIS — Z87891 Personal history of nicotine dependence: Secondary | ICD-10-CM | POA: Insufficient documentation

## 2018-01-12 DIAGNOSIS — Z8774 Personal history of (corrected) congenital malformations of heart and circulatory system: Secondary | ICD-10-CM | POA: Diagnosis not present

## 2018-01-12 DIAGNOSIS — Z7982 Long term (current) use of aspirin: Secondary | ICD-10-CM | POA: Diagnosis not present

## 2018-01-12 DIAGNOSIS — R7303 Prediabetes: Secondary | ICD-10-CM | POA: Diagnosis not present

## 2018-01-12 DIAGNOSIS — E785 Hyperlipidemia, unspecified: Secondary | ICD-10-CM | POA: Diagnosis not present

## 2018-01-12 DIAGNOSIS — I951 Orthostatic hypotension: Secondary | ICD-10-CM | POA: Diagnosis not present

## 2018-01-12 DIAGNOSIS — R0602 Shortness of breath: Secondary | ICD-10-CM | POA: Diagnosis present

## 2018-01-12 HISTORY — PX: LEFT HEART CATH AND CORONARY ANGIOGRAPHY: CATH118249

## 2018-01-12 LAB — GLUCOSE, CAPILLARY: Glucose-Capillary: 101 mg/dL — ABNORMAL HIGH (ref 70–99)

## 2018-01-12 SURGERY — LEFT HEART CATH AND CORONARY ANGIOGRAPHY
Anesthesia: LOCAL

## 2018-01-12 MED ORDER — SODIUM CHLORIDE 0.9 % WEIGHT BASED INFUSION: 3.0000 mL/kg/h | INTRAVENOUS | Status: AC

## 2018-01-12 MED ORDER — VERAPAMIL HCL 2.5 MG/ML IV SOLN
INTRAVENOUS | Status: AC
  Filled 2018-01-12: qty 2

## 2018-01-12 MED ORDER — HEPARIN (PORCINE) IN NACL 1000-0.9 UT/500ML-% IV SOLN
INTRAVENOUS | Status: AC
  Filled 2018-01-12: qty 1000

## 2018-01-12 MED ORDER — SODIUM CHLORIDE 0.9% FLUSH: 3.0000 mL | INTRAVENOUS | Status: DC | PRN

## 2018-01-12 MED ORDER — SODIUM CHLORIDE 0.9 % WEIGHT BASED INFUSION
1.0000 mL/kg/h | INTRAVENOUS | Status: AC
  Administered 2018-01-12: 1 mL/kg/h via INTRAVENOUS

## 2018-01-12 MED ORDER — HEPARIN (PORCINE) IN NACL 1000-0.9 UT/500ML-% IV SOLN
INTRAVENOUS | Status: DC | PRN
  Administered 2018-01-12 (×2): 500 mL

## 2018-01-12 MED ORDER — SODIUM CHLORIDE 0.9 % IV SOLN: 250.0000 mL | INTRAVENOUS | Status: DC | PRN

## 2018-01-12 MED ORDER — SODIUM CHLORIDE 0.9% FLUSH: 3.0000 mL | Freq: Two times a day (BID) | INTRAVENOUS | Status: DC

## 2018-01-12 MED ORDER — LIDOCAINE HCL (PF) 1 % IJ SOLN
INTRAMUSCULAR | Status: DC | PRN
  Administered 2018-01-12: 2 mL

## 2018-01-12 MED ORDER — ACETAMINOPHEN 325 MG PO TABS: 650.0000 mg | ORAL_TABLET | ORAL | Status: DC | PRN

## 2018-01-12 MED ORDER — SODIUM CHLORIDE 0.9 % WEIGHT BASED INFUSION: 1.0000 mL/kg/h | INTRAVENOUS | Status: DC

## 2018-01-12 MED ORDER — LIDOCAINE HCL (PF) 1 % IJ SOLN
INTRAMUSCULAR | Status: AC
  Filled 2018-01-12: qty 30

## 2018-01-12 MED ORDER — IOHEXOL 350 MG/ML SOLN
INTRAVENOUS | Status: DC | PRN
  Administered 2018-01-12: 75 mL

## 2018-01-12 MED ORDER — VERAPAMIL HCL 2.5 MG/ML IV SOLN
INTRAVENOUS | Status: DC | PRN
  Administered 2018-01-12: 10 mL via INTRA_ARTERIAL

## 2018-01-12 MED ORDER — HEPARIN SODIUM (PORCINE) 1000 UNIT/ML IJ SOLN
INTRAMUSCULAR | Status: AC
  Filled 2018-01-12: qty 1

## 2018-01-12 MED ORDER — ASPIRIN 81 MG PO CHEW: 81.0000 mg | CHEWABLE_TABLET | ORAL | Status: DC

## 2018-01-12 MED ORDER — HEPARIN SODIUM (PORCINE) 1000 UNIT/ML IJ SOLN
INTRAMUSCULAR | Status: DC | PRN
  Administered 2018-01-12: 5000 [IU] via INTRAVENOUS

## 2018-01-12 SURGICAL SUPPLY — 12 items
CATH 5FR JL3.5 JR4 ANG PIG MP (CATHETERS) ×1 IMPLANT
COVER PRB 48X5XTLSCP FOLD TPE (BAG) IMPLANT
COVER PROBE 5X48 (BAG) ×2
DEVICE RAD COMP TR BAND LRG (VASCULAR PRODUCTS) ×1 IMPLANT
GLIDESHEATH SLEND SS 6F .021 (SHEATH) ×1 IMPLANT
GUIDEWIRE INQWIRE 1.5J.035X260 (WIRE) IMPLANT
INQWIRE 1.5J .035X260CM (WIRE) ×2
KIT HEART LEFT (KITS) ×2 IMPLANT
PACK CARDIAC CATHETERIZATION (CUSTOM PROCEDURE TRAY) ×2 IMPLANT
SYR MEDRAD MARK V 150ML (SYRINGE) ×2 IMPLANT
TRANSDUCER W/STOPCOCK (MISCELLANEOUS) ×2 IMPLANT
TUBING CIL FLEX 10 FLL-RA (TUBING) ×2 IMPLANT

## 2018-01-12 NOTE — Interval H&P Note (Signed)
History and Physical Interval Note:  01/12/2018 10:43 AM  Jeff Weaver  has presented today for surgery, with the diagnosis of Abnormal Coronary CT  The various methods of treatment have been discussed with the patient and family. After consideration of risks, benefits and other options for treatment, the patient has consented to  Procedure(s): LEFT HEART CATH AND CORONARY ANGIOGRAPHY (N/A) as a surgical intervention .  The patient's history has been reviewed, patient examined, no change in status, stable for surgery.  I have reviewed the patient's chart and labs.  Questions were answered to the patient's satisfaction.   Cath Lab Visit (complete for each Cath Lab visit)  Clinical Evaluation Leading to the Procedure:   ACS: No.  Non-ACS:    Anginal Classification: CCS III  Anti-ischemic medical therapy: No Therapy  Non-Invasive Test Results: No non-invasive testing performed  Prior CABG: No previous CABG        [name] 01/12/2018 10:43 AM

## 2018-01-12 NOTE — Discharge Instructions (Signed)

## 2018-01-12 NOTE — Progress Notes (Signed)
Pt's monitor alarming for low heart rate.  When counted one full minute heart rate 32.  Irregular.  Pt asymptomatic. VSS Stat EKG obtained and Dr. Martinique notified and came to assess pt.  Pt cleared for discharge but will follow up in the office for further evaluation.

## 2018-01-15 ENCOUNTER — Telehealth: Payer: Self-pay | Admitting: Cardiology

## 2018-01-15 NOTE — Telephone Encounter (Signed)
New message    Spouse requesting call from nurse, has concerns about seeing APP on 8/1

## 2018-01-15 NOTE — Telephone Encounter (Signed)
Checked in on the pt and wife and both are fine and agreed to the pts APP/Post cath appt and Follow-up with Dayna Dunn PA-C for 02/01/18.  Both gracious for the call back and all the assistance provided.

## 2018-01-16 ENCOUNTER — Telehealth: Payer: Self-pay | Admitting: Cardiology

## 2018-01-16 NOTE — Telephone Encounter (Signed)
New Message:      Pt's wife states she has some questions about the pt's heart cath.

## 2018-01-16 NOTE — Telephone Encounter (Signed)
Wife was just calling to give [place] the update about the pts cath coming back normal with no stents needed.  Wife states that the pt will follow-up as planned with [name] PA-C for post cath appt on 02/01/18.  Wife is gracious for all the assistance provided and care provided.

## 2018-01-19 ENCOUNTER — Ambulatory Visit (HOSPITAL_COMMUNITY)
Admission: RE | Admit: 2018-01-19 | Discharge: 2018-01-19 | Disposition: A | Discharge status: Home / Self Care | Payer: Medicare Other | Source: Ambulatory Visit | Attending: Cardiology | Admitting: Cardiology

## 2018-01-19 DIAGNOSIS — I25118 Atherosclerotic heart disease of native coronary artery with other forms of angina pectoris: Secondary | ICD-10-CM

## 2018-01-19 DIAGNOSIS — Z01812 Encounter for preprocedural laboratory examination: Secondary | ICD-10-CM | POA: Diagnosis not present

## 2018-01-19 DIAGNOSIS — R0609 Other forms of dyspnea: Secondary | ICD-10-CM | POA: Insufficient documentation

## 2018-01-19 DIAGNOSIS — I2510 Atherosclerotic heart disease of native coronary artery without angina pectoris: Secondary | ICD-10-CM

## 2018-01-19 DIAGNOSIS — E785 Hyperlipidemia, unspecified: Secondary | ICD-10-CM | POA: Insufficient documentation

## 2018-01-19 DIAGNOSIS — R0600 Dyspnea, unspecified: Secondary | ICD-10-CM

## 2018-01-19 LAB — PULMONARY FUNCTION TEST
DL/VA % pred: 89 %
DL/VA: 4.01 ml/min/mmHg/L
DLCO cor % pred: 58 %
DLCO cor: 17.24 ml/min/mmHg
DLCO unc % pred: 58 %
DLCO unc: 17.47 ml/min/mmHg
FEF 25-75 Post: 2.73 L/sec
FEF 25-75 Pre: 1.64 L/sec
FEF2575-%Change-Post: 65 %
FEF2575-%Pred-Post: 129 %
FEF2575-%Pred-Pre: 77 %
FEV1-%Change-Post: 13 %
FEV1-%Pred-Post: 79 %
FEV1-%Pred-Pre: 70 %
FEV1-Post: 2.29 L
FEV1-Pre: 2.02 L
FEV1FVC-%Change-Post: 0 %
FEV1FVC-%Pred-Pre: 103 %
FEV6-%Change-Post: 11 %
FEV6-%Pred-Post: 80 %
FEV6-%Pred-Pre: 71 %
FEV6-Post: 2.98 L
FEV6-Pre: 2.67 L
FEV6FVC-%Change-Post: -1 %
FEV6FVC-%Pred-Post: 104 %
FEV6FVC-%Pred-Pre: 106 %
FVC-%Change-Post: 13 %
FVC-%Pred-Post: 76 %
FVC-%Pred-Pre: 67 %
FVC-Post: 3.03 L
FVC-Pre: 2.67 L
Post FEV1/FVC ratio: 76 %
Post FEV6/FVC ratio: 98 %
Pre FEV1/FVC ratio: 76 %
Pre FEV6/FVC Ratio: 100 %
RV % pred: 108 %
RV: 2.64 L
TLC % pred: 80 %
TLC: 5.32 L

## 2018-01-19 MED ORDER — ALBUTEROL SULFATE (2.5 MG/3ML) 0.083% IN NEBU
2.5000 mg | INHALATION_SOLUTION | Freq: Once | RESPIRATORY_TRACT | Status: AC
  Administered 2018-01-19: 2.5 mg via RESPIRATORY_TRACT

## 2018-01-30 ENCOUNTER — Encounter: Payer: Self-pay | Admitting: Physician Assistant

## 2018-01-30 NOTE — Progress Notes (Signed)
Cardiology Office Note    Date:  02/01/2018  ID:  Jeff Weaver, DOB [birth year], MRN [id number] PCP:  [name], MD  Cardiologist:  Ena Dawley, MD   Chief Complaint: F/u cath  History of Present Illness:  Jeff Weaver is a 72 y.o. male with history of obesity, HTN with h/o orthostasis, HLD, chronic diastolic CHF/hypertensive heart disease, prediabetes, cerebral aneurysm s/p clipping at [facility] in 8/13 c/b short-term memory loss, cerebral hemorrhage and seizure disorder s/p VP shunt, sleep apnea, mild AI, sinus bradycardia (baseline HR 50s, 1st degree AV block, 2nd degree AV block type 1) who presents for post-cath follow-up.  He was first seen by Dr. [name] 04/2013 for chest pain with normal nuclear stress test. Admitted 11/2013 for chest pain and elevated troponin (0.74) -> LHC 11/26/13 revealed angiographically normal coronary arteries, mild mid LAD myocardial bridging, moderate to severely elevated LVEDP - with systemic hypertension and preserved LVEF. Last echo 2D echo in 01/2016 showed mod LVH, EF 60-65%, grade 2 DD, no RWMA, mild AI. More recently he was seen in the office 01/03/18 with worsening DOE, but no chest pain. Cardiac cath 01/12/18 showed minimal nonobstructive CAD, normal LVEF, normal LVEDP. Cath note states, "during recovery patient developed Second degree AV block Mobitz type 1 with HR into the 40s. He is asymptomatic. No prior history of dizziness or syncope. On no rate slowing medication. Baseline Ecg with very long PR interval. May need Holter as outpatient." Last labs: 01/2018 K 4.8, Cr 1.06, glucose 112, Hgb 15.1, plt 179, 09/2017 LDL 56, TSH wnl. PFTs showed minimal Obstructive Airways Disease, no significant COPD  He returns for follow-up overall feeling well. He is here with wife [name]. His dyspnea is only with exertion. Used to walk 3 miles a day regularly but his walking partner is not in great health so this has fallen by the wayside. No anginal-type chest pain. He does  have an atypical shooting pain infrequently that goes from lower abdomen up to left chest wall. No edema. No syncope or dizziness.    Past Medical History:  Diagnosis Date  . Aortic insufficiency    a. mild 2017 echo.  . Cerebral aneurysm    a. s/p clipping at [facility] in 8/13 c/b short-term memory loss, cerebral hemorrhage and seizure disorder s/p VP shunt.  . Cerebral hemorrhage ([place])   . Chronic diastolic CHF (congestive heart failure) ([place])   . CVA (cerebral infarction)   . Diverticulosis   . Enteritis (regional)    Dr Olevia Perches  . First degree AV block   . GI bleed   . HTN (hypertension)   . Hyperlipidemia   . Hypertensive heart disease   . IBS (irritable bowel syndrome)   . Mild CAD    Cardiac cath 01/12/18 showed minimal nonobstructive CAD, normal LVEF, normal LVEDP.  . Orthostatic hypotension   . Pre-diabetes   . 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 Rheumatoid arthritis(714.0)    dxed in [employer] in 1980s  . Second degree AV block, Mobitz type I   . Seizures ([place])   . Sinus bradycardia   . Sleep apnea     Past Surgical History:  Procedure Laterality Date  . CHOLECYSTECTOMY N/A 01/07/2016   Procedure: LAPAROSCOPIC CHOLECYSTECTOMY ;  Surgeon: Coralie Keens, MD;  Location: [place];  Service: General;  Laterality: N/A;  . cns shunt  02/23/12  . COLONOSCOPY W/ POLYPECTOMY  1978   negative 2009, Dr Olevia Perches. Due 2014  .  CRANIOTOMY  02/02/12   Dr Harvel Ricks, [place] of aneurysm  . 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 HERNIA REPAIR    . LEFT HEART CATH AND CORONARY ANGIOGRAPHY N/A 01/12/2018   Procedure: LEFT HEART CATH AND CORONARY ANGIOGRAPHY;  Surgeon: Martinique, Peter M, MD;  Location: [place] [facility];  Service: Cardiovascular;  Laterality: N/A;  . LEFT HEART CATHETERIZATION WITH CORONARY ANGIOGRAM N/A 11/26/2013   Procedure: LEFT HEART CATHETERIZATION WITH CORONARY ANGIOGRAM;   Surgeon: [name], MD;  Location[facility] CATH LAB;  Service: Cardiovascular;  Laterality: N/A;  . NOSE SURGERY    . SHOULDER SURGERY  1997  . TONSILLECTOMY AND ADENOIDECTOMY    . VENTRICULOPERITONEAL SHUNT  02-23-12   INSERTION OF RIGHT FRONTAL VENTRICULOPERITONEAL SHUNT WITH A CODMAN HAKIM PROGRAMMABLE VALVE    Current Medications: Current Meds  Medication Sig  . acetaminophen (TYLENOL) 325 MG tablet Take 325-650 mg by mouth every 6 (six) hours as needed for headache.  Marland Kitchen amoxicillin (AMOXIL) 500 MG capsule Take 2,000 mg by mouth as directed. Take 2000 mg by mouth 1 hour prior to dental appointments  . aspirin EC 81 MG tablet Take 1 tablet (81 mg total) by mouth daily.  . blood glucose meter kit and supplies KIT Dispense based on patient and insurance preference. Use up to four times daily as directed. (prediabetes hypoglycemia).  . cetirizine (ZYRTEC) 10 MG tablet Take 1 tablet (10 mg total) by mouth daily.  . cholecalciferol (VITAMIN D) 1000 units tablet Take 1,000 Units by mouth daily.  . clotrimazole (LOTRIMIN) 1 % cream Apply 1 application topically as directed. For skin irritation  . fluticasone (FLONASE) 50 MCG/ACT nasal spray Place 2 sprays into both nostrils daily as needed for rhinitis.   . Lacosamide 100 MG TABS Take 200 mg by mouth 2 (two) times daily.   Marland Kitchen levETIRAcetam (KEPPRA) 500 MG tablet Take 1,000 mg by mouth 2 (two) times daily.   Marland Kitchen LORazepam (ATIVAN) 1 MG tablet Take 1 mg by mouth every 6 (six) hours as needed for seizure.   Marland Kitchen losartan (COZAAR) 25 MG tablet Take 1 tablet (25 mg total) by mouth 2 (two) times daily.  . mirabegron ER (MYRBETRIQ) 25 MG TB24 tablet Take 25 mg by mouth every evening.   . Multiple Vitamins-Minerals (MULTIVITAMIN) tablet Take 1 tablet by mouth daily.  . nitroGLYCERIN (NITROSTAT) 0.4 MG SL tablet Place 1 tablet (0.4 mg total) under the tongue every 5 (five) minutes as needed for chest pain.  . NON FORMULARY CPAP Machine: authorized by [facility] in  [place]  . pyridOXINE (VITAMIN B-6) 100 MG tablet Take 100 mg by mouth daily.  . rosuvastatin (CRESTOR) 5 MG tablet Take 1 tablet three times week and increase as tolerated  . Tamsulosin HCl (FLOMAX) 0.4 MG CAPS Take 0.4 mg by mouth every evening.   . thiamine 100 MG tablet Take 100 mg by mouth daily.  . vitamin B-12 (CYANOCOBALAMIN) 1000 MCG tablet Take 1,000 mcg by mouth daily.      Allergies:   Crestor [rosuvastatin]; Haloperidol; Simvastatin; Lisinopril; Nifedipine; and Pravastatin   Social History   Socioeconomic History  . Marital status: Married    Spouse name: Not on file  . Number of children: 1  . Years of education: Not on file  . Highest education level: Not on file  Occupational History  . Occupation: retired  Scientific laboratory technician  . Financial resource strain: Not on file  .  Food insecurity:    Worry: Not on file    Inability: Not on file  . Transportation needs:    Medical: Not on file    Non-medical: Not on file  Tobacco Use  . Smoking status: Former Smoker    Types: Pipe    Last attempt to quit: 07/04/2009    Years since quitting: 8.5  . Smokeless tobacco: Former Network engineer and Sexual Activity  . Alcohol use: Yes    Alcohol/week: 0.0 oz    Comment: Very Infrequently   . Drug use: No  . Sexual activity: Not on file  Lifestyle  . Physical activity:    Days per week: Not on file    Minutes per session: Not on file  . Stress: Not on file  Relationships  . Social connections:    Talks on phone: Not on file    Gets together: Not on file    Attends religious service: Not on file    Active member of club or organization: Not on file    Attends meetings of clubs or organizations: Not on file    Relationship status: Not on file  Other Topics Concern  . Not on file  Social History Narrative  . Not on file     Family History:  The patient's family history includes COPD in his father; Coronary artery disease in his father; Dementia (age of onset: 43) in his  sister; Diabetes in his brother and sister; Hepatitis in his mother. There is no history of Colon cancer.  ROS:   Please see the history of present illness. Otherwise, review of systems is positive for .  All other systems are reviewed and otherwise negative.    PHYSICAL EXAM:   VS:  BP 130/80   Pulse (!) 54   Ht 5' 8"  (1.727 m)   Wt 264 lb (119.7 kg)   SpO2 94%   BMI 40.14 kg/m   BMI: Body mass index is 40.14 kg/m. GEN: Well nourished, well developed obese WM, in no acute distress HEENT: normocephalic, atraumatic Neck: no JVD, carotid bruits, or masses Cardiac: RRR; no murmurs, rubs, or gallops, no edema  Respiratory:  clear to auscultation bilaterally, normal work of breathing GI: soft, nontender, nondistended, + BS MS: no deformity or atrophy Skin: warm and dry, no rash. Right radial cath site without hematoma or ecchymosis; good pulse. Neuro:  Alert and Oriented x 3, Strength and sensation are intact, follows commands Psych: euthymic mood, full affect  Wt Readings from Last 3 Encounters:  02/01/18 264 lb (119.7 kg)  01/12/18 258 lb (117 kg)  01/03/18 262 lb 9.6 oz (119.1 kg)      Studies/Labs Reviewed:   EKG:  EKG was ordered today and personally reviewed by me and demonstrates NSR with long first degree AVB, 54bpm, low voltage, nonspecific St-T changes. Voltage appears similar to 2017 when he had his echo  Recent Labs: 09/08/2017: ALT 40; TSH 1.360 01/03/2018: BUN 13; Creatinine, Ser 1.06; Hemoglobin 15.1; Platelets 179; Potassium 4.8; Sodium 138   Lipid Panel    Component Value Date/Time   CHOL 128 09/08/2017 1202   CHOL 159 03/27/2013 0818   TRIG 125 09/08/2017 1202   TRIG 127 03/27/2013 0818   HDL 47 09/08/2017 1202   HDL 45 03/27/2013 0818   CHOLHDL 2.7 09/08/2017 1202   CHOLHDL 4 01/30/2017 1158   VLDL 34.0 01/30/2017 1158   LDLCALC 56 09/08/2017 1202   LDLCALC 89 03/27/2013 0818   LDLDIRECT 87.6  09/19/2012 1206    Additional studies/ records that were  reviewed today include: Summarized above.    ASSESSMENT & PLAN:   1. Bradycardia - noted during cath. Wife wrote down what the monitor was showing in his recovery and it sounds like it reached 29 at the lowest. She does not think he was sleeping at that time. HR is stable today, but he certainly has evidence of conduction disease. He is not on any AVN blocking agents. Will check thyroid function and arrange 48 hour Holter. Will refer to EP to get him plugged in. Although I'm not certain any of this warrants current PPM I suspect down the road he may have further conduction issues as he ages. Told them to make sure in the future when other doctors prescribe new meds that he makes them aware he has h/o slow HR. 2. Shortness of breath - suspect multifactorial at least in part due to deconditioning, but would suggest event monitor to evaluate HR excursion with activity as there may be a component of chronotropic incompetence. 3. Minimal CAD - cath site OK. Continue risk factor eduction. 4. Chronic diastolic CHF - appears euvolemic. LVEDP wnl.  Disposition: F/u EP after Holter monitoring. F/u Dr. [name] 4 months. Also advised he review atypical episodic abd pain with PCP.  Medication Adjustments/Labs and Tests Ordered: Current medicines are reviewed at length with the patient today.  Concerns regarding medicines are outlined above. Medication changes, Labs and Tests ordered today are summarized above and listed in the Patient Instructions accessible in Encounters.   Signed, Charlie Pitter, PA-C  02/01/2018 11:26 AM    [facility] [place], [place], [place]  [address] Phone: [phone]; Fax: [phone]

## 2018-02-01 ENCOUNTER — Encounter: Payer: Self-pay | Admitting: Physician Assistant

## 2018-02-01 ENCOUNTER — Ambulatory Visit (INDEPENDENT_AMBULATORY_CARE_PROVIDER_SITE_OTHER): Payer: Medicare Other | Admitting: Physician Assistant

## 2018-02-01 VITALS — BP 130/80 | HR 54 | Ht 68.0 in | Wt 264.0 lb

## 2018-02-01 DIAGNOSIS — R0602 Shortness of breath: Secondary | ICD-10-CM

## 2018-02-01 DIAGNOSIS — I5032 Chronic diastolic (congestive) heart failure: Secondary | ICD-10-CM

## 2018-02-01 DIAGNOSIS — I2510 Atherosclerotic heart disease of native coronary artery without angina pectoris: Secondary | ICD-10-CM | POA: Diagnosis not present

## 2018-02-01 DIAGNOSIS — R001 Bradycardia, unspecified: Secondary | ICD-10-CM | POA: Diagnosis not present

## 2018-02-01 LAB — T4, FREE: Free T4: 1.36 ng/dL (ref 0.82–1.77)

## 2018-02-01 LAB — TSH: TSH: 1.39 u[IU]/mL (ref 0.450–4.500)

## 2018-02-01 NOTE — Addendum Note (Signed)
Addended by: Gaetano Net on: 02/01/2018 03:13 PM   Modules accepted: Orders

## 2018-02-01 NOTE — Patient Instructions (Addendum)
Medication Instructions:  Your physician recommends that you continue on your current medications as directed. Please refer to the Current Medication list given to you today.   Labwork: TODAY: FREE T4  Testing/Procedures: Your physician has recommended that you wear a 48-HOUR holter monitor. Holter monitors are medical devices that record the heart's electrical activity. Doctors most often use these monitors to diagnose arrhythmias. Arrhythmias are problems with the speed or rhythm of the heartbeat. The monitor is a small, portable device. You can wear one while you do your normal daily activities. This is usually used to diagnose what is causing palpitations/syncope (passing out).    Follow-Up: Your physician wants you to follow-up in: 4 MONTHS WITH NELSON  YOUR ARE BEING REFERRED TO ELECTROPHYSIOLOGY AFTER HOLTER MONITOR.    Any Other Special Instructions Will Be Listed Below (If Applicable).     If you need a refill on your cardiac medications before your next appointment, please call your pharmacy.

## 2018-02-04 ENCOUNTER — Other Ambulatory Visit: Payer: Self-pay | Admitting: Internal Medicine

## 2018-02-06 ENCOUNTER — Other Ambulatory Visit: Payer: Self-pay | Admitting: Physician Assistant

## 2018-02-06 ENCOUNTER — Ambulatory Visit (INDEPENDENT_AMBULATORY_CARE_PROVIDER_SITE_OTHER): Payer: Medicare Other

## 2018-02-06 DIAGNOSIS — R001 Bradycardia, unspecified: Secondary | ICD-10-CM

## 2018-02-06 DIAGNOSIS — I440 Atrioventricular block, first degree: Secondary | ICD-10-CM

## 2018-02-14 ENCOUNTER — Telehealth: Payer: Self-pay | Admitting: Cardiology

## 2018-02-14 DIAGNOSIS — N3281 Overactive bladder: Secondary | ICD-10-CM | POA: Diagnosis not present

## 2018-02-14 DIAGNOSIS — R351 Nocturia: Secondary | ICD-10-CM | POA: Diagnosis not present

## 2018-02-14 DIAGNOSIS — N401 Enlarged prostate with lower urinary tract symptoms: Secondary | ICD-10-CM | POA: Diagnosis not present

## 2018-02-14 DIAGNOSIS — R338 Other retention of urine: Secondary | ICD-10-CM | POA: Diagnosis not present

## 2018-02-14 MED FILL — NYSTATIN 100,000 UNIT/GM PO: 100000 | 15 days supply | Qty: 30 | Fill #0

## 2018-02-14 NOTE — Telephone Encounter (Signed)
New problem   Pt returning your call from yesterday. Please call pt's wife this afternoon after 2pm

## 2018-02-14 NOTE — Telephone Encounter (Signed)
Jeff Spark, MD  Nuala Alpha, LPN         Profound sinus bradycardia with HR down to 30' even during awake hours. Minimum HR 28 BPM.  Very frequent PVCs - 7 % of total beats and very short runs of nsVT.    Please arrange Ep consult for a PM placement consideration.   Result Notes for HOLTER MONITOR - 48 HOUR   Notes recorded by Nuala Alpha, LPN on 7/89/3810 at 1:75 PM EDT Spoke with the pts wife Jeff Weaver (on [place]) and informed her of pts holter monitor results and recommendations for the pt to be referred to EP, for consult for consideration of PM placement.  Pt is scheduled to see Dr Curt Bears on next Tuesday 8/20 at 0900. Wife is aware to have pt here 15 mins prior to that appt. Wife verbalized understanding and agrees with this plan. Wife states she will endorse this to the pt.

## 2018-02-19 NOTE — Progress Notes (Signed)
Electrophysiology Office Note   Date:  02/19/2018   ID:  CLAVIN RUHLMAN, DOB 04-09-1946, MRN [id number]  PCP:  [name], MD  Cardiologist:  [name] Primary Electrophysiologist:  Will Meredith Leeds, MD    No chief complaint on file.    History of Present Illness: LAUREN AGUAYO is a 72 y.o. male who is being seen today for the evaluation of bradycardia at the request of [name]. Presenting today for electrophysiology evaluation.  He has a history of obesity, orthostasis, hyperlipidemia, chronic diastolic heart failure/hypertensive heart disease, prediabetes, cerebral aneurysm status post clipping, seizure disorder status post VP shunt, sleep apnea, mild AI.  His main complaint is dyspnea with exertion.  He is to be able to walk 3 miles daily but is not able to do this amount of exercise.  Walking partner is not in good health and thus his exercise is not been consistent.  He has had bradycardia in the past with his lowest at home reaching 29 bpm recorded by his wife.  He does have some shortness of breath when he exerts himself such as climbing stairs, but feels well otherwise.  He also gets some mild chest discomfort in the center of his chest when he is sitting down.  He had a heart catheterization that showed minimal coronary disease.   Today, he denies symptoms of palpitations, orthopnea, PND, lower extremity edema, claudication, dizziness, presyncope, syncope, bleeding, or neurologic sequela. The patient is tolerating medications without difficulties.    Past Medical History:  Diagnosis Date  . Aortic insufficiency    a. mild 2017 echo.  . Cerebral aneurysm    a. s/p clipping at [facility] in 8/13 c/b short-term memory loss, cerebral hemorrhage and seizure disorder s/p VP shunt.  . Cerebral hemorrhage ([place])   . Chronic diastolic CHF (congestive heart failure) ([place])   . CVA (cerebral infarction)   . Diverticulosis   . Enteritis (regional)    Dr Olevia Perches  . First degree AV block    . GI bleed   . HTN (hypertension)   . Hyperlipidemia   . Hypertensive heart disease   . IBS (irritable bowel syndrome)   . Mild CAD    Cardiac cath 01/12/18 showed minimal nonobstructive CAD, normal LVEF, normal LVEDP.  . Orthostatic hypotension   . Pre-diabetes   . 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 Rheumatoid arthritis(714.0)    dxed in [employer] in 1980s  . Second degree AV block, Mobitz type I   . Seizures ([place])   . Sinus bradycardia   . Sleep apnea    Past Surgical History:  Procedure Laterality Date  . CHOLECYSTECTOMY N/A 01/07/2016   Procedure: LAPAROSCOPIC CHOLECYSTECTOMY ;  Surgeon: Coralie Keens, MD;  Location: [facility];  Service: General;  Laterality: N/A;  . cns shunt  02/23/12  . COLONOSCOPY W/ POLYPECTOMY  1978   negative 2009, Dr Olevia Perches. Due 2014  . CRANIOTOMY  02/02/12   Dr Harvel Ricks, [place] of aneurysm  . 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 HERNIA REPAIR    . LEFT HEART CATH AND CORONARY ANGIOGRAPHY N/A 01/12/2018   Procedure: LEFT HEART CATH AND CORONARY ANGIOGRAPHY;  Surgeon: Martinique, Peter M, MD;  Location: [facility];  Service: Cardiovascular;  Laterality: N/A;  . LEFT HEART CATHETERIZATION WITH CORONARY ANGIOGRAM N/A 11/26/2013   Procedure: LEFT HEART CATHETERIZATION WITH CORONARY ANGIOGRAM;  Surgeon: [name], MD;  Location: [facility];  Service: Cardiovascular;  Laterality: N/A;  . NOSE SURGERY    . SHOULDER SURGERY  1997  . TONSILLECTOMY AND ADENOIDECTOMY    . VENTRICULOPERITONEAL SHUNT  02-23-12   INSERTION OF RIGHT FRONTAL VENTRICULOPERITONEAL SHUNT WITH A CODMAN HAKIM PROGRAMMABLE VALVE     Current Outpatient Medications  Medication Sig Dispense Refill  . acetaminophen (TYLENOL) 325 MG tablet Take 325-650 mg by mouth every 6 (six) hours as needed for headache.    Marland Kitchen amoxicillin (AMOXIL) 500 MG capsule Take 2,000 mg by mouth as directed. Take  2000 mg by mouth 1 hour prior to dental appointments    . aspirin EC 81 MG tablet Take 1 tablet (81 mg total) by mouth daily. 90 tablet 3  . blood glucose meter kit and supplies KIT Dispense based on patient and insurance preference. Use up to four times daily as directed. (prediabetes hypoglycemia). 1 each 0  . cetirizine (ZYRTEC) 10 MG tablet Take 1 tablet (10 mg total) by mouth daily. 30 tablet 11  . cholecalciferol (VITAMIN D) 1000 units tablet Take 1,000 Units by mouth daily.    . clotrimazole (LOTRIMIN) 1 % cream Apply 1 application topically as directed. For skin irritation    . fluticasone (FLONASE) 50 MCG/ACT nasal spray Place 2 sprays into both nostrils daily as needed for rhinitis.     . Lacosamide 100 MG TABS Take 200 mg by mouth 2 (two) times daily.     Marland Kitchen levETIRAcetam (KEPPRA) 500 MG tablet Take 1,000 mg by mouth 2 (two) times daily.     Marland Kitchen LORazepam (ATIVAN) 1 MG tablet Take 1 mg by mouth every 6 (six) hours as needed for seizure.     Marland Kitchen losartan (COZAAR) 25 MG tablet TAKE 1 TABLET TWICE A DAY 180 tablet 3  . mirabegron ER (MYRBETRIQ) 25 MG TB24 tablet Take 25 mg by mouth every evening.     . Multiple Vitamins-Minerals (MULTIVITAMIN) tablet Take 1 tablet by mouth daily.    . nitroGLYCERIN (NITROSTAT) 0.4 MG SL tablet Place 1 tablet (0.4 mg total) under the tongue every 5 (five) minutes as needed for chest pain. 25 tablet 3  . NON FORMULARY CPAP Machine: authorized by [place] in [place]    . pyridOXINE (VITAMIN B-6) 100 MG tablet Take 100 mg by mouth daily.    . rosuvastatin (CRESTOR) 5 MG tablet Take 1 tablet three times week and increase as tolerated 45 tablet 2  . Tamsulosin HCl (FLOMAX) 0.4 MG CAPS Take 0.4 mg by mouth every evening.     . thiamine 100 MG tablet Take 100 mg by mouth daily.    . vitamin B-12 (CYANOCOBALAMIN) 1000 MCG tablet Take 1,000 mcg by mouth daily.     No current facility-administered medications for this visit.     Allergies:   Crestor [rosuvastatin];  Haloperidol; Simvastatin; Lisinopril; Nifedipine; and Pravastatin   Social History:  The patient  reports that he quit smoking about 8 years ago. His smoking use included pipe. He has quit using smokeless tobacco. He reports that he drinks alcohol. He reports that he does not use drugs.   Family History:  The patient's family history includes COPD in his father; Coronary artery disease in his father; Dementia (age of onset: 82) in his sister; Diabetes in his brother and sister; Hepatitis in his mother.    ROS:  Please see the history of present illness.   Otherwise, review of systems is positive for leg swelling, chest pain.  All other systems are reviewed and negative.    PHYSICAL EXAM: VS:  There were no vitals taken for this visit. , BMI There is no height or weight on file to calculate BMI. GEN: Well nourished, well developed, in no acute distress  HEENT: normal  Neck: no JVD, carotid bruits, or masses Cardiac: RRR; no murmurs, rubs, or gallops,no edema  Respiratory:  clear to auscultation bilaterally, normal work of breathing GI: soft, nontender, nondistended, + BS MS: no deformity or atrophy  Skin: warm and dry Neuro:  Strength and sensation are intact Psych: euthymic mood, full affect  EKG:  EKG is ordered today. Personal review of the ekg ordered shows sinus rhythm, rate 54, first-degree AV block  Recent Labs: 09/08/2017: ALT 40 01/03/2018: BUN 13; Creatinine, Ser 1.06; Hemoglobin 15.1; Platelets 179; Potassium 4.8; Sodium 138 02/01/2018: TSH 1.390    Lipid Panel     Component Value Date/Time   CHOL 128 09/08/2017 1202   CHOL 159 03/27/2013 0818   TRIG 125 09/08/2017 1202   TRIG 127 03/27/2013 0818   HDL 47 09/08/2017 1202   HDL 45 03/27/2013 0818   CHOLHDL 2.7 09/08/2017 1202   CHOLHDL 4 01/30/2017 1158   VLDL 34.0 01/30/2017 1158   LDLCALC 56 09/08/2017 1202   LDLCALC 89 03/27/2013 0818   LDLDIRECT 87.6 09/19/2012 1206     Wt Readings from Last 3 Encounters:    02/01/18 264 lb (119.7 kg)  01/12/18 258 lb (117 kg)  01/03/18 262 lb 9.6 oz (119.1 kg)      Other studies Reviewed: Additional studies/ records that were reviewed today include: LHC 01/12/18  Review of the above records today demonstrates:   The left ventricular systolic function is normal.  LV end diastolic pressure is normal.  The left ventricular ejection fraction is 55-65% by visual estimate.    1. Minimal nonobstructive CAD 2. Normal LV function 3. Normal LVEDP  Holter 02/12/18 - personally reviewed  Profound sinus bradycardia with HR down to 30' even during awake hours. Minimum HR 28 BPM.  1. AVB, frequent non-conducted P waves.  Very frequent PVCs - 7 % of total beats and very short runs of nsVT.  TTE 2017 - Left ventricle: The cavity size was normal. There was moderate   concentric hypertrophy. Systolic function was normal. The   estimated ejection fraction was in the range of 60% to 65%. Wall   motion was normal; there were no regional wall motion   abnormalities. Features are consistent with a pseudonormal left   ventricular filling pattern, with concomitant abnormal relaxation   and increased filling pressure (grade 2 diastolic dysfunction). - Aortic valve: There was mild regurgitation.  ASSESSMENT AND PLAN:  1.  Sinus bradycardia: Cardiac monitor showed significant sinus bradycardia.  He also had 2-1 AV block.  He had some slow heart rate during waking hours, but this appears to be due to nonconducted PACs.  At this point, he would like to avoid pacing.  I feel that this is a reasonable alternative.  I am worried that if we do implant a pacemaker, as long first-degree AV block, he would likely paced the majority of the time which may give him a worse symptoms.  He will likely need a pacemaker in the future, but will hold off at this point.  2.  Chronic diastolic heart failure: Feeling well without major signs of volume overload.  Would plan to avoid AV nodal  blockers.  3.  Hypertension: Mildly elevated today but  has been normal in the past.  Continue current dose of losartan  Current medicines are reviewed at length with the patient today.   The patient does not have concerns regarding his medicines.  The following changes were made today:  none  Labs/ tests ordered today include:  No orders of the defined types were placed in this encounter.  Case discussed with primary cardiology  Disposition:   FU with Will Camnitz 3 months  Signed, Will Meredith Leeds, MD  02/19/2018 11:29 AM     [facility] [address] [place] [address] [phone] (office) [phone] (fax)

## 2018-02-20 ENCOUNTER — Encounter: Payer: Self-pay | Admitting: Cardiology

## 2018-02-20 ENCOUNTER — Ambulatory Visit (INDEPENDENT_AMBULATORY_CARE_PROVIDER_SITE_OTHER): Payer: Medicare Other | Admitting: Cardiology

## 2018-02-20 VITALS — BP 136/76 | HR 54 | Ht 68.0 in | Wt 268.6 lb

## 2018-02-20 DIAGNOSIS — I10 Essential (primary) hypertension: Secondary | ICD-10-CM | POA: Diagnosis not present

## 2018-02-20 DIAGNOSIS — I441 Atrioventricular block, second degree: Secondary | ICD-10-CM | POA: Diagnosis not present

## 2018-02-20 DIAGNOSIS — I5032 Chronic diastolic (congestive) heart failure: Secondary | ICD-10-CM

## 2018-02-20 NOTE — Patient Instructions (Addendum)
Medication Instructions:  Your physician recommends that you continue on your current medications as directed. Please refer to the Current Medication list given to you today.  * If you need a refill on your cardiac medications before your next appointment, please call your pharmacy.   Labwork: None ordered  Testing/Procedures: None ordered  Follow-Up: Your physician recommends that you schedule a follow-up appointment in: 3 months with Dr. Curt Bears.  *Please note that any paperwork needing to be filled out by the provider will need to be addressed at the front desk prior to seeing the provider. Please note that any FMLA, disability or other documents regarding health condition is subject to a $25.00 charge that must be received prior to completion of paperwork in the form of a money order or check.  Thank you for choosing [facility]!!   Trinidad Curet, RN [phone]

## 2018-03-01 MED FILL — ACETAMINOPHEN/COD #3 TABLET: 300-30 | 2 days supply | Qty: 16 | Fill #0

## 2018-03-01 MED FILL — AMOXICILLIN 875 MG TABS: 875 | 5 days supply | Qty: 10 | Fill #0

## 2018-03-09 IMAGING — DX DG CHEST 2V
2 series · 2 of 2 positions shown · non-contrast
Comparison: PA and lateral chest x-ray January 06, 2016 and November 25, 2013.

CLINICAL DATA: Follow-up following cholecystectomy 1 month ago. No
current complaints.

EXAM:
CHEST  2 VIEW

[chest pa]
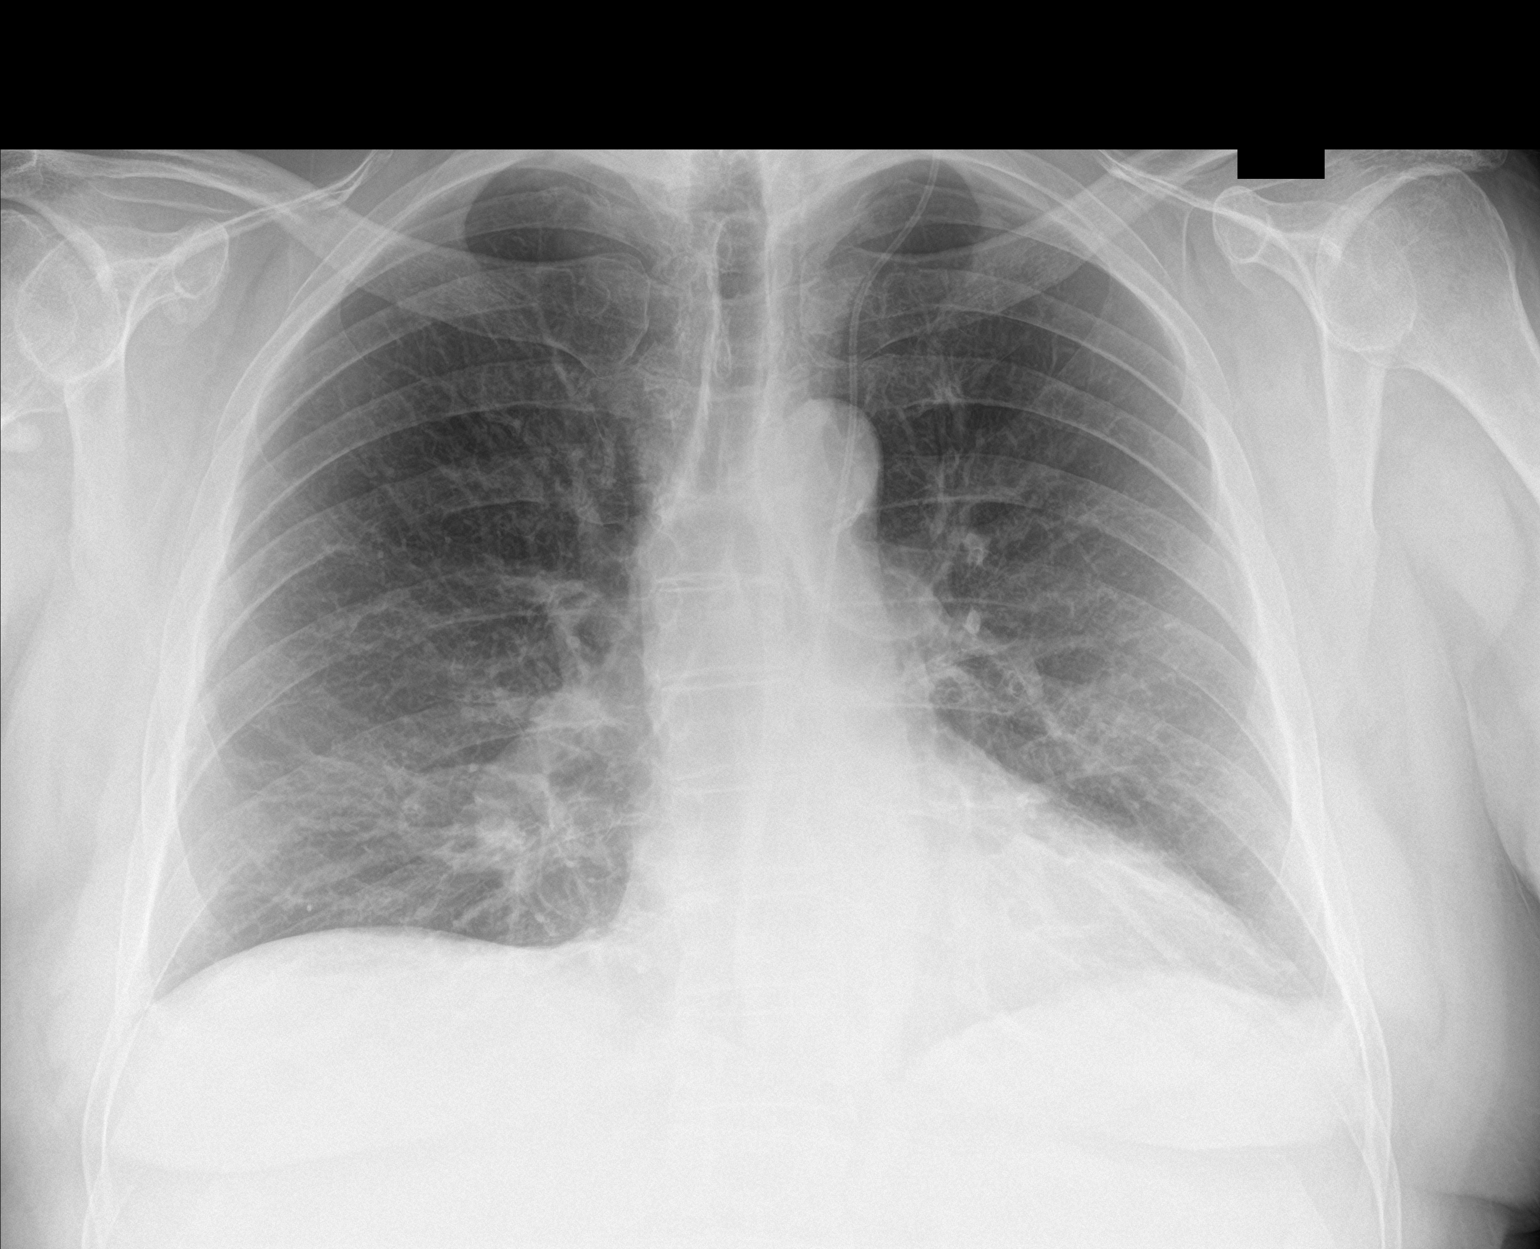

[chest lat]
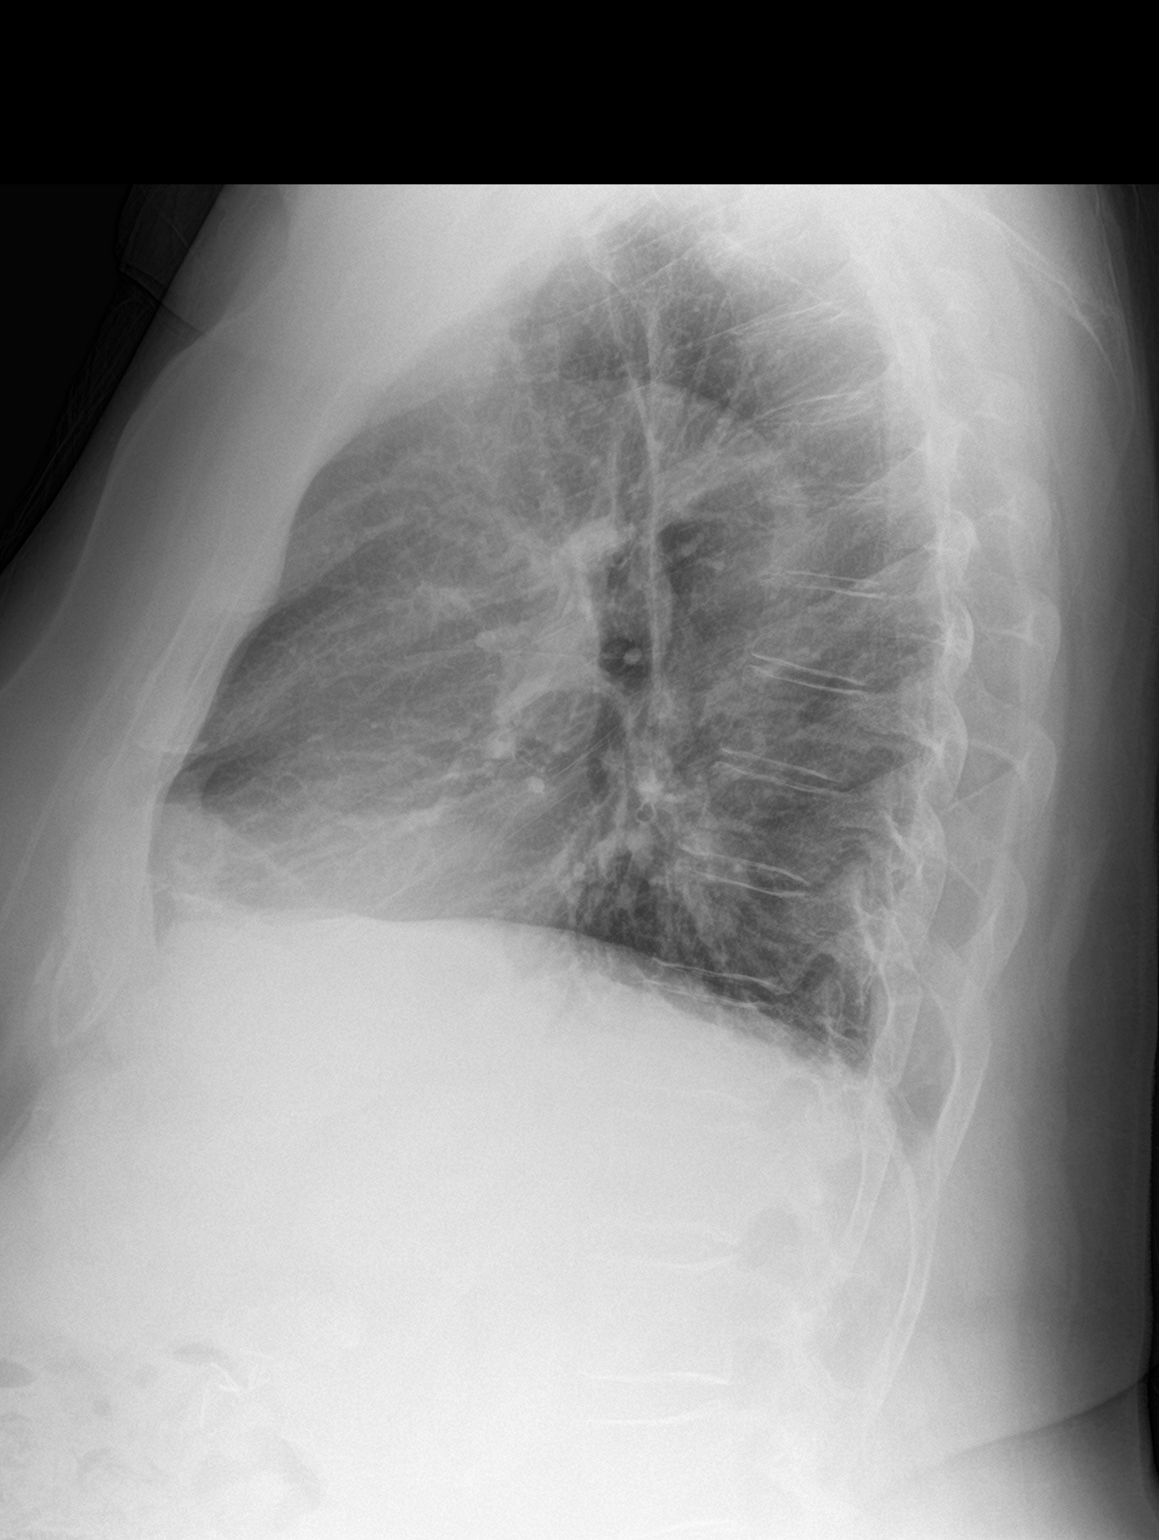

[2 of 2 positions shown; findings below may reference images not displayed]

FINDINGS: The lungs are adequately inflated. There is no focal infiltrate.
There is stable prominence of the right infrahilar region. There is
subtle density in the left lower lung on the frontal view which is
stable consistent with scarring. The heart is top-normal in size.
The pulmonary vascularity is normal. There is calcification in the
wall of the aortic arch. A ventricular shunt can be seen in the left
aspect of the neck and left mediastinum but is lower most portion
cannot be clearly followed.
IMPRESSION: There is no active cardiopulmonary disease.

Aortic atherosclerosis.

## 2018-03-19 ENCOUNTER — Other Ambulatory Visit (INDEPENDENT_AMBULATORY_CARE_PROVIDER_SITE_OTHER): Payer: Medicare Other

## 2018-03-19 ENCOUNTER — Ambulatory Visit (INDEPENDENT_AMBULATORY_CARE_PROVIDER_SITE_OTHER)
Admission: RE | Admit: 2018-03-19 | Discharge: 2018-03-19 | Disposition: A | Discharge status: Home / Self Care | Payer: Medicare Other | Source: Ambulatory Visit | Attending: Internal Medicine | Admitting: Internal Medicine

## 2018-03-19 ENCOUNTER — Encounter: Payer: Self-pay | Admitting: Internal Medicine

## 2018-03-19 ENCOUNTER — Ambulatory Visit (INDEPENDENT_AMBULATORY_CARE_PROVIDER_SITE_OTHER): Payer: Medicare Other | Admitting: Internal Medicine

## 2018-03-19 DIAGNOSIS — R05 Cough: Secondary | ICD-10-CM

## 2018-03-19 DIAGNOSIS — R058 Other specified cough: Secondary | ICD-10-CM

## 2018-03-19 DIAGNOSIS — R0600 Dyspnea, unspecified: Secondary | ICD-10-CM

## 2018-03-19 DIAGNOSIS — R0609 Other forms of dyspnea: Secondary | ICD-10-CM

## 2018-03-19 DIAGNOSIS — I2510 Atherosclerotic heart disease of native coronary artery without angina pectoris: Secondary | ICD-10-CM | POA: Diagnosis not present

## 2018-03-19 LAB — CBC WITH DIFFERENTIAL/PLATELET
Basophils Absolute: 0 10*3/uL (ref 0.0–0.1)
Basophils Relative: 0.5 % (ref 0.0–3.0)
Eosinophils Absolute: 0.2 10*3/uL (ref 0.0–0.7)
Eosinophils Relative: 2 % (ref 0.0–5.0)
HCT: 42.7 % (ref 39.0–52.0)
Hemoglobin: 14.7 g/dL (ref 13.0–17.0)
Lymphocytes Relative: 19.3 % (ref 12.0–46.0)
Lymphs Abs: 1.7 10*3/uL (ref 0.7–4.0)
MCHC: 34.5 g/dL (ref 30.0–36.0)
MCV: 91.7 fl (ref 78.0–100.0)
Monocytes Absolute: 0.7 10*3/uL (ref 0.1–1.0)
Monocytes Relative: 7.8 % (ref 3.0–12.0)
Neutro Abs: 6 10*3/uL (ref 1.4–7.7)
Neutrophils Relative %: 70.4 % (ref 43.0–77.0)
Platelets: 168 10*3/uL (ref 150.0–400.0)
RBC: 4.65 Mil/uL (ref 4.22–5.81)
RDW: 13.6 % (ref 11.5–15.5)
WBC: 8.6 10*3/uL (ref 4.0–10.5)

## 2018-03-19 LAB — NITRIC OXIDE: Nitric Oxide: 47

## 2018-03-19 MED ORDER — FAMOTIDINE 20 MG PO TABS: ORAL_TABLET | ORAL | 11 refills | Status: DC

## 2018-03-19 MED ORDER — PANTOPRAZOLE SODIUM 40 MG PO TBEC: 40.0000 mg | DELAYED_RELEASE_TABLET | Freq: Every day | ORAL | 2 refills | Status: DC

## 2018-03-19 MED FILL — ROSUVASTATIN CALCIUM 5 MG T: 5 | 45 days supply | Qty: 45 | Fill #1

## 2018-03-19 MED FILL — FAMOTIDINE 20 MG TABLET: 20 | 30 days supply | Qty: 30 | Fill #0

## 2018-03-19 MED FILL — PANTOPRAZOLE SOD DR 40 MG T: 40 | 30 days supply | Qty: 30 | Fill #0

## 2018-03-19 NOTE — Patient Instructions (Addendum)
Try off zyrtec first to see if it makes any difference - can take it as needed for itching / sneezing/ runny nose  After a week off zyrtec try off flonase until return to office   Pantoprazole (protonix) 40 mg   Take  30-60 min before first meal of the day and Pepcid (famotidine)  20 mg one @  bedtime until return to office - this is the best way to tell whether stomach acid is contributing to your problem.     GERD (REFLUX)  is an extremely common cause of respiratory symptoms just like yours , many times with no obvious heartburn at all.    It can be treated with medication, but also with lifestyle changes including elevation of the head of your bed (ideally with 6 inch  bed blocks),  Smoking cessation, avoidance of late meals, excessive alcohol, and avoid fatty foods, chocolate, peppermint, colas, red wine, and acidic juices such as orange juice.  NO MINT OR MENTHOL PRODUCTS SO NO COUGH DROPS   USE SUGARLESS CANDY INSTEAD (Jolley ranchers or Stover's or Life Savers) or even ice chips will also do - the key is to swallow to prevent all throat clearing. NO OIL BASED VITAMINS - use powdered substitutes.   Please remember to go to the lab and x-ray department downstairs in the basement  for your tests - we will call you with the results when they are available.          Please schedule a follow up office visit in 4weeks, call sooner if needed with all medications /inhalers/ solutions in hand so we can verify exactly what you are taking. This includes all medications from all doctors and over the [place] separate them into two bags:  the ones you take automatically, no matter what, vs the ones you take just when you feel you need them "BAG #2 is UP TO YOU"  - this will really help [place] help you take your medications more effectively.

## 2018-03-19 NOTE — Progress Notes (Signed)
Jeff Weaver, male    DOB: 1946-04-22,    MRN: [id number]     Brief patient profile:  41 yowm quit smoking 2010  "but just pipes" wt around 180 and very active then cva's 2013  related to brain aneuryms>>> Prolonged admit required trach at [facility] and eventually able to everything he could do before the cva but around late spring 2019 started walking slower at the same he was due to loss of walking companion > LHC 01/12/18 with minimal CAD and nl lvedp.    03/19/2018  1st pulmonary eval/ Jeff Weaver  Chief Complaint  Patient presents with  . Pulmonary Consult    Referred by Dr. Quay Weaver.  Pt c/o SOB for the past several years, worse x 6 months.  He states is SOB "all the time"- worse with exertion such as walking up a flight of stairs.  Dyspnea:  MMRC2 = can't walk a nl pace on a flat grade s sob but does fine slow and flat eg can walk at mall  Cough: "always cough" /dry hack occasionally  wakes up with it but mostly daytime Sleep: on side/ flat but 2 pillows  SABA use: none   No obvious day to day or daytime variability or assoc excess/ purulent sputum or mucus plugs or hemoptysis or cp or chest tightness, subjective wheeze or overt sinus or hb symptoms.   sleepis as above  without nocturnal  or early am exacerbation  of respiratory  c/o's or need for noct saba. Also denies any obvious fluctuation of symptoms with weather or environmental changes or other aggravating or alleviating factors except as outlined above   No unusual exposure hx or h/o childhood pna/ asthma or knowledge of premature birth.  Current Allergies, Complete Past Medical History, Past Surgical History, Family History, and Social History were reviewed in [employer] record.  ROS  The following are not active complaints unless bolded Hoarseness, sore throat, dysphagia, dental problems, itching, sneezing,  nasal congestion or discharge of excess mucus or purulent secretions, ear ache,   fever, chills,  sweats, unintended wt loss or wt gain, classically pleuritic or exertional cp,  orthopnea pnd or arm/hand swelling  or leg swelling, presyncope, palpitations, abdominal pain, anorexia, nausea, vomiting, diarrhea  or change in bowel habits or change in bladder habits, change in stools or change in urine, dysuria, hematuria,  rash, arthralgias, visual complaints, headache, numbness, weakness or ataxia or problems with walking or coordination,  change in mood or  memory.           Past Medical History:  Diagnosis Date  . Aortic insufficiency    a. mild 2017 echo.  . Cerebral aneurysm    a. s/p clipping at [facility] in 8/13 c/b short-term memory loss, cerebral hemorrhage and seizure disorder s/p VP shunt.  . Cerebral hemorrhage ([place])   . Chronic diastolic CHF (congestive heart failure) ([place])   . CVA (cerebral infarction)   . Diverticulosis   . Enteritis (regional)    Dr Jeff Weaver  . First degree AV block   . GI bleed   . HTN (hypertension)   . Hyperlipidemia   . Hypertensive heart disease   . IBS (irritable bowel syndrome)   . Mild CAD    Cardiac cath 01/12/18 showed minimal nonobstructive CAD, normal LVEF, normal LVEDP.  . Orthostatic hypotension   . Pre-diabetes   . Regional enteritis of large intestine ([place]) since 1978  . Rheumatoid arthritis(714.0)    dxed in [employer] in  1980s  . Second degree AV block, Mobitz type I   . Seizures ([place])   . Sinus bradycardia   . Sleep apnea     Outpatient Medications Prior to Visit  Medication Sig Dispense Refill  . acetaminophen (TYLENOL) 325 MG tablet Take 325-650 mg by mouth every 6 (six) hours as needed for headache.    Marland Kitchen amoxicillin (AMOXIL) 500 MG capsule Take 2,000 mg by mouth as directed. Take 2000 mg by mouth 1 hour prior to dental appointments    . aspirin EC 81 MG tablet Take 1 tablet (81 mg total) by mouth daily. 90 tablet 3  . blood glucose meter kit and supplies KIT Dispense based on patient and insurance preference. Use up to four times  daily as directed. (prediabetes hypoglycemia). 1 each 0  . cetirizine (ZYRTEC) 10 MG tablet Take 1 tablet (10 mg total) by mouth daily. 30 tablet 11  . cholecalciferol (VITAMIN D) 1000 units tablet Take 1,000 Units by mouth daily.    . fluticasone (FLONASE) 50 MCG/ACT nasal spray Place 2 sprays into both nostrils daily as needed for rhinitis.     . Lacosamide 100 MG TABS Take 200 mg by mouth 2 (two) times daily.     Marland Kitchen levETIRAcetam (KEPPRA) 500 MG tablet Take 1,000 mg by mouth 2 (two) times daily.     Marland Kitchen LORazepam (ATIVAN) 1 MG tablet Take 1 mg by mouth every 6 (six) hours as needed for seizure.     Marland Kitchen losartan (COZAAR) 25 MG tablet TAKE 1 TABLET TWICE A DAY 180 tablet 3  . mirabegron ER (MYRBETRIQ) 25 MG TB24 tablet Take 25 mg by mouth every evening.     . nitroGLYCERIN (NITROSTAT) 0.4 MG SL tablet Place 1 tablet (0.4 mg total) under the tongue every 5 (five) minutes as needed for chest pain. 25 tablet 3  . NON FORMULARY CPAP Machine: authorized by [place] in [place]    . pyridOXINE (VITAMIN B-6) 100 MG tablet Take 100 mg by mouth daily.    . rosuvastatin (CRESTOR) 5 MG tablet Take 1 tablet three times week and increase as tolerated 45 tablet 2  . Tamsulosin HCl (FLOMAX) 0.4 MG CAPS Take 0.4 mg by mouth every evening.     . thiamine 100 MG tablet Take 100 mg by mouth daily.    . vitamin B-12 (CYANOCOBALAMIN) 1000 MCG tablet Take 1,000 mcg by mouth daily.    . clotrimazole (LOTRIMIN) 1 % cream Apply 1 application topically as directed. For skin irritation    . Multiple Vitamins-Minerals (MULTIVITAMIN) tablet Take 1 tablet by mouth daily.     No facility-administered medications prior to visit.            Objective:     BP 104/64 (BP Location: Left Arm, Cuff Size: Normal)   Pulse 60   Ht 5' 8"  (1.727 m)   Wt 264 lb (119.7 kg)   SpO2 95%   BMI 40.14 kg/m   SpO2: 95 %  RA   Wt Readings from Last 3 Encounters:  03/19/18 264 lb (119.7 kg)  02/20/18 268 lb 9.6 oz (121.8 kg)  02/01/18 264  lb (119.7 kg)          Obese pleasant wm nad      HEENT: nl dentition, turbinates bilaterally, and oropharynx. Nl external ear canals without cough reflex   NECK :  without JVD/Nodes/TM/ nl carotid upstrokes bilaterally   LUNGS: no acc muscle use,  Nl contour chest which is clear  to A and P bilaterally without cough on insp or exp maneuvers   CV:  RRR  no s3 or murmur or increase in P2, and trace pitting edema R > L   ABD:  Obese/ soft and nontender with nl inspiratory excursion in the supine position. No bruits or organomegaly appreciated, bowel sounds nl  MS:  Nl gait/ ext warm without deformities, calf tenderness, cyanosis or clubbing No obvious joint restrictions   SKIN: warm and dry without lesions    NEURO:  alert, approp, nl sensorium with  no motor or cerebellar deficits apparent.    CXR PA and Lateral:   03/19/2018 :    I personally reviewed images and agree with radiology impression as follows:   No active cardiopulmonary disease.  Labs ordered 03/19/2018  Allergy profile       Assessment   Dyspnea on exertion LHC 01/12/18 with minimal CAD and nl lvedp. PFT's  01/19/18   FEV1 2.29  (79 % ) ratio 76%  p 13 % improvement from saba p nothing prior to study with DLCO  58/58c  % corrects to 89 % for alv volume   And ERV 41  - 03/19/2018  Walked RA x 3 laps @ 185 ft each stopped due to  End of study, nl pace, no  desat, min sob    Reported chronic daily symptoms are markedly to objective findings and not clear to what extent this is actually a pulmonary  problem but pt does appear to have difficult to sort out respiratory symptoms of unknown origin for which  DDX  = almost all start with A and  include Adherence, Ace Inhibitors, Acid Reflux, Active Sinus Disease, Alpha 1 Antitripsin deficiency, Anxiety masquerading as Airways dz,  ABPA,  Allergy(esp in young), Aspiration (esp in elderly), Adverse effects of meds,  Active smokers, A bunch of PE's/clot burden (a few small  clots can't cause this syndrome unless there is already severe underlying pulm or vascular dz with poor reserve),  Anemia or thyroid disorder, plus two Bs  = Bronchiectasis and Beta blocker use..and one C= CHF    Adherence is always the initial "prime suspect" and is a multilayered concern that requires a "trust but verify" approach in every patient - starting with knowing how to use medications, especially inhalers, correctly, keeping up with refills and understanding the fundamental difference between maintenance and prns vs those medications only taken for a very short course and then stopped and not refilled.  - return with all meds in hand using a trust but verify approach to confirm accurate Medication  Reconciliation The principal here is that until we are certain that the  patients are doing what we've asked, it makes no sense to ask them to do more.   ? Acid (or non-acid) GERD > always difficult to exclude as up to 75% of pts in some series report no assoc GI/ Heartburn symptoms> rec max (24h)  acid suppression and diet restrictions/ reviewed and instructions given in writing.   ? Allergy /asthma > min by pfts/ send profile  ? Active sinus dz >  Does not flonase consistently, ok to just use zyrtec prn  ? Adverse drug effects > none of the usual suspects noted   ? Anxiety/depression/ deconditioning  > usually at the bottom of this list of usual suspects but should  Be considered  and may interfere with adherence and also interpretation of response or lack thereof to symptom management which can be quite subjective.   ?  chf > excluded by recent LHC with nl lvedp         Upper airway cough syndrome vs cough variant asthma Allergy profile 03/19/2018 >  Eos 0.2 /  IgE pending  - FENO 03/19/2018  =   47     Upper airway cough syndrome (previously labeled PNDS),  is so named because it's frequently impossible to sort out how much is  CR/sinusitis with freq throat clearing (which can be  related to primary GERD)   vs  causing  secondary (" extra esophageal")  GERD from wide swings in gastric pressure that occur with throat clearing, often  promoting self use of mint and menthol lozenges that reduce the lower esophageal sphincter tone and exacerbate the problem further in a cyclical fashion.   These are the same pts (now being labeled as having "irritable larynx syndrome" by some cough centers) who not infrequently have a history of having failed to tolerate ace inhibitors,  dry powder inhalers or biphosphonates or report having atypical/extraesophageal reflux symptoms that don't respond to standard doses of PPI  and are easily confused as having aecopd or asthma flares by even experienced allergists/ pulmonologists (myself included).   For now rx as gerd and see back in 6 week to recheck      Total time devoted to counseling  > 50 % of initial 60 min office visit:  review case with pt/ discussion of options/alternatives/ personally creating written customized instructions  in presence of pt  then going over those specific  Instructions directly with the pt including how to use all of the meds but in particular covering each new medication in detail and the difference between the maintenance= "automatic" meds and the prns using an action plan format for the latter (If this problem/symptom => do that organization reading Left to right).  Please see AVS from this visit for a full list of these instructions which I personally wrote for this pt and  are unique to this visit.      Christinia Gully, MD 03/19/2018

## 2018-03-20 ENCOUNTER — Encounter: Payer: Self-pay | Admitting: Internal Medicine

## 2018-03-20 DIAGNOSIS — R058 Other specified cough: Secondary | ICD-10-CM | POA: Insufficient documentation

## 2018-03-20 DIAGNOSIS — R05 Cough: Secondary | ICD-10-CM | POA: Insufficient documentation

## 2018-03-20 LAB — RESPIRATORY ALLERGY PROFILE REGION II ~~LOC~~

## 2018-03-20 LAB — INTERPRETATION:

## 2018-03-20 NOTE — Assessment & Plan Note (Addendum)
Allergy profile 03/19/2018 >  Eos 0.2 /  IgE pending  - FENO 03/19/2018  =   47     Upper airway cough syndrome (previously labeled PNDS),  is so named because it's frequently impossible to sort out how much is  CR/sinusitis with freq throat clearing (which can be related to primary GERD)   vs  causing  secondary (" extra esophageal")  GERD from wide swings in gastric pressure that occur with throat clearing, often  promoting self use of mint and menthol lozenges that reduce the lower esophageal sphincter tone and exacerbate the problem further in a cyclical fashion.   These are the same pts (now being labeled as having "irritable larynx syndrome" by some cough centers) who not infrequently have a history of having failed to tolerate ace inhibitors,  dry powder inhalers or biphosphonates or report having atypical/extraesophageal reflux symptoms that don't respond to standard doses of PPI  and are easily confused as having aecopd or asthma flares by even experienced allergists/ pulmonologists (myself included).   For now rx as gerd and see back in 6 week to recheck      Total time devoted to counseling  > 50 % of initial 60 min office visit:  review case with pt/ discussion of options/alternatives/ personally creating written customized instructions  in presence of pt  then going over those specific  Instructions directly with the pt including how to use all of the meds but in particular covering each new medication in detail and the difference between the maintenance= "automatic" meds and the prns using an action plan format for the latter (If this problem/symptom => do that organization reading Left to right).  Please see AVS from this visit for a full list of these instructions which I personally wrote for this pt and  are unique to this visit.

## 2018-03-20 NOTE — Assessment & Plan Note (Signed)
LHC 01/12/18 with minimal CAD and nl lvedp. PFT's  01/19/18   FEV1 2.29  (79 % ) ratio 76%  p 13 % improvement from saba p nothing prior to study with DLCO  58/58c  % corrects to 89 % for alv volume   And ERV 41  - 03/19/2018  Walked RA x 3 laps @ 185 ft each stopped due to  End of study, nl pace, no  desat, min sob    Reported chronic daily symptoms are markedly to objective findings and not clear to what extent this is actually a pulmonary  problem but pt does appear to have difficult to sort out respiratory symptoms of unknown origin for which  DDX  = almost all start with A and  include Adherence, Ace Inhibitors, Acid Reflux, Active Sinus Disease, Alpha 1 Antitripsin deficiency, Anxiety masquerading as Airways dz,  ABPA,  Allergy(esp in young), Aspiration (esp in elderly), Adverse effects of meds,  Active smokers, A bunch of PE's/clot burden (a few small clots can't cause this syndrome unless there is already severe underlying pulm or vascular dz with poor reserve),  Anemia or thyroid disorder, plus two Bs  = Bronchiectasis and Beta blocker use..and one C= CHF    Adherence is always the initial "prime suspect" and is a multilayered concern that requires a "trust but verify" approach in every patient - starting with knowing how to use medications, especially inhalers, correctly, keeping up with refills and understanding the fundamental difference between maintenance and prns vs those medications only taken for a very short course and then stopped and not refilled.  - return with all meds in hand using a trust but verify approach to confirm accurate Medication  Reconciliation The principal here is that until we are certain that the  patients are doing what we've asked, it makes no sense to ask them to do more.   ? Acid (or non-acid) GERD > always difficult to exclude as up to 75% of pts in some series report no assoc GI/ Heartburn symptoms> rec max (24h)  acid suppression and diet restrictions/ reviewed  and instructions given in writing.   ? Allergy /asthma > min by pfts/ send profile  ? Active sinus dz >  Does not flonase consistently, ok to just use zyrtec prn  ? Adverse drug effects > none of the usual suspects noted   ? Anxiety/depression/ deconditioning  > usually at the bottom of this list of usual suspects but should  Be considered  and may interfere with adherence and also interpretation of response or lack thereof to symptom management which can be quite subjective.   ? chf > excluded by recent LHC with nl lvedp

## 2018-03-21 DIAGNOSIS — L03031 Cellulitis of right toe: Secondary | ICD-10-CM | POA: Diagnosis not present

## 2018-03-22 NOTE — Progress Notes (Signed)
Spoke with the pt's spouse and notified of results

## 2018-03-28 DIAGNOSIS — L03031 Cellulitis of right toe: Secondary | ICD-10-CM | POA: Diagnosis not present

## 2018-04-14 ENCOUNTER — Ambulatory Visit (INDEPENDENT_AMBULATORY_CARE_PROVIDER_SITE_OTHER): Payer: Medicare Other | Admitting: Family Medicine

## 2018-04-14 ENCOUNTER — Encounter: Payer: Self-pay | Admitting: Family Medicine

## 2018-04-14 VITALS — BP 112/66 | HR 76 | Temp 98.5°F | Wt 267.0 lb

## 2018-04-14 DIAGNOSIS — R05 Cough: Secondary | ICD-10-CM | POA: Diagnosis not present

## 2018-04-14 DIAGNOSIS — I2510 Atherosclerotic heart disease of native coronary artery without angina pectoris: Secondary | ICD-10-CM | POA: Diagnosis not present

## 2018-04-14 DIAGNOSIS — J069 Acute upper respiratory infection, unspecified: Secondary | ICD-10-CM

## 2018-04-14 DIAGNOSIS — R059 Cough, unspecified: Secondary | ICD-10-CM

## 2018-04-14 MED ORDER — BENZONATATE 100 MG PO CAPS: 100.0000 mg | ORAL_CAPSULE | Freq: Three times a day (TID) | ORAL | 0 refills | Status: DC | PRN

## 2018-04-14 NOTE — Patient Instructions (Signed)
INSTRUCTIONS FOR UPPER RESPIRATORY INFECTION:  -plenty of rest and fluids  -nasal saline wash 2-3 times daily (use prepackaged nasal saline or bottled/distilled water if making your own)   -can use tylenol as needed as directed for aches and sorethroat  -in the winter time, using a humidifier at night is helpful (please follow cleaning instructions)  -if you are taking a cough medication - use only as directed, may also try a teaspoon of honey to coat the throat and throat lozenges.  -for sore throat, salt water gargles can help  -follow up if you have fevers, facial pain, tooth pain, difficulty breathing or are worsening or symptoms do not improve over the next several days  Upper Respiratory Infection, Adult An upper respiratory infection (URI) is also known as the common cold. It is often caused by a type of germ (virus). Colds are easily spread (contagious). You can pass it to others by kissing, coughing, sneezing, or drinking out of the same glass. Usually, you get better in 1 to 3  weeks.  However, the cough can last for even longer. HOME CARE   Only take medicine as told by your doctor. Follow instructions provided above.  Drink enough water and fluids to keep your pee (urine) clear or pale yellow.  Get plenty of rest.  Return to work when your temperature is < 100 for 24 hours or as told by your doctor. You may use a face mask and wash your hands to stop your cold from spreading. GET HELP RIGHT AWAY IF:   After the first few days, you feel you are getting worse.  You have questions about your medicine.  You have chills, shortness of breath, or red spit (mucus).  You have pain in the face for more then 1-2 days, especially when you bend forward.  You have a fever, puffy (swollen) neck, pain when you swallow, or white spots in the back of your throat.  You have a bad headache, ear pain, sinus pain, or chest pain.  You have a high-pitched whistling sound when you breathe  in and out (wheezing).  You cough up blood.  You have sore muscles or a stiff neck. MAKE SURE YOU:   Understand these instructions.  Will watch your condition.  Will get help right away if you are not doing well or get worse. Document Released: 12/07/2007 Document Revised: 09/12/2011 Document Reviewed: 09/25/2013 [facility] Patient Information 2015 [place], [place]. This information is not intended to replace advice given to you by your health care provider. Make sure you discuss any questions you have with your health care provider.

## 2018-04-14 NOTE — Progress Notes (Signed)
HPI:  Using dictation device. Unfortunately this device frequently misinterprets words/phrases.   Acute visit for respiratory illness: -started:4-5 days ago -symptoms:nasal congestion - clear, sneezing, sore throat, cough, sometimes feels in chest when coughs - mild SOB - but chronic, saw Dr. Melvyn Novas about this and a cardiologist  -denies:fever, NVD, tooth pain, body aches, thick or discolored sputum or mucus, wheezing -has tried: nothing -sick contacts/travel/risks: no reported flu, strep or tick exposure -Hx of: allergies  ROS: See pertinent positives and negatives per HPI.  Past Medical History:  Diagnosis Date  . Aortic insufficiency    a. mild 2017 echo.  . Cerebral aneurysm    a. s/p clipping at [facility] in 8/13 c/b short-term memory loss, cerebral hemorrhage and seizure disorder s/p VP shunt.  . Cerebral hemorrhage ([place])   . Chronic diastolic CHF (congestive heart failure) ([place])   . CVA (cerebral infarction)   . Diverticulosis   . Enteritis (regional)    Dr Olevia Perches  . First degree AV block   . GI bleed   . HTN (hypertension)   . Hyperlipidemia   . Hypertensive heart disease   . IBS (irritable bowel syndrome)   . Mild CAD    Cardiac cath 01/12/18 showed minimal nonobstructive CAD, normal LVEF, normal LVEDP.  . Orthostatic hypotension   . Pre-diabetes   . Regional enteritis of large intestine ([place]) since 1978  . Rheumatoid arthritis(714.0)    dxed in [employer] in 1980s  . Second degree AV block, Mobitz type I   . Seizures ([place])   . Sinus bradycardia   . Sleep apnea     Past Surgical History:  Procedure Laterality Date  . CHOLECYSTECTOMY N/A 01/07/2016   Procedure: LAPAROSCOPIC CHOLECYSTECTOMY ;  Surgeon: Coralie Keens, MD;  Location: [place];  Service: General;  Laterality: N/A;  . cns shunt  02/23/12  . COLONOSCOPY W/ POLYPECTOMY  1978   negative 2009, Dr Olevia Perches. Due 2014  . CRANIOTOMY  02/02/12   Dr Harvel Ricks, [place] of aneurysm  . CRANIOTOMY  02-02-12   left  pterional craniotomy for clipping complex anterior communicating artery aneurysm   . HERNIA REPAIR    . LEFT HEART CATH AND CORONARY ANGIOGRAPHY N/A 01/12/2018   Procedure: LEFT HEART CATH AND CORONARY ANGIOGRAPHY;  Surgeon: Martinique, Peter M, MD;  Location: [place] [facility];  Service: Cardiovascular;  Laterality: N/A;  . LEFT HEART CATHETERIZATION WITH CORONARY ANGIOGRAM N/A 11/26/2013   Procedure: LEFT HEART CATHETERIZATION WITH CORONARY ANGIOGRAM;  Surgeon: [name], MD;  Location: [facility] C[facility] LAB;  Service: Cardiovascular;  Laterality: N/A;  . NOSE SURGERY    . SHOULDER SURGERY  1997  . TONSILLECTOMY AND ADENOIDECTOMY    . VENTRICULOPERITONEAL SHUNT  02-23-12   INSERTION OF RIGHT FRONTAL VENTRICULOPERITONEAL SHUNT WITH A CODMAN HAKIM PROGRAMMABLE VALVE    Family History  Problem Relation Age of Onset  . COPD Father   . Coronary artery disease Father        MI in 31s  . Hepatitis Mother   . Diabetes Sister   . Dementia Sister 67  . Diabetes Brother   . Colon cancer Neg Hx     Social History   Socioeconomic History  . Marital status: Married    Spouse name: Not on file  . Number of children: 1  . Years of education: Not on file  . Highest education level: Not on file  Occupational History  . Occupation: retired  Scientific laboratory technician  . Financial resource strain: Not  on file  . Food insecurity:    Worry: Not on file    Inability: Not on file  . Transportation needs:    Medical: Not on file    Non-medical: Not on file  Tobacco Use  . Smoking status: Former Smoker    Packs/day: 0.00    Years: 30.00    Pack years: 0.00    Types: Pipe    Last attempt to quit: 07/04/2008    Years since quitting: 9.7  . Smokeless tobacco: Former Network engineer and Sexual Activity  . Alcohol use: Yes    Alcohol/week: 0.0 standard drinks    Comment: Very Infrequently   . Drug use: No  . Sexual activity: Not on file  Lifestyle  . Physical activity:    Days per week: Not on file    Minutes  per session: Not on file  . Stress: Not on file  Relationships  . Social connections:    Talks on phone: Not on file    Gets together: Not on file    Attends religious service: Not on file    Active member of club or organization: Not on file    Attends meetings of clubs or organizations: Not on file    Relationship status: Not on file  Other Topics Concern  . Not on file  Social History Narrative  . Not on file     Current Outpatient Medications:  .  acetaminophen (TYLENOL) 325 MG tablet, Take 325-650 mg by mouth every 6 (six) hours as needed for headache., Disp: , Rfl:  .  amoxicillin (AMOXIL) 500 MG capsule, Take 2,000 mg by mouth as directed. Take 2000 mg by mouth 1 hour prior to dental appointments, Disp: , Rfl:  .  aspirin EC 81 MG tablet, Take 1 tablet (81 mg total) by mouth daily., Disp: 90 tablet, Rfl: 3 .  blood glucose meter kit and supplies KIT, Dispense based on patient and insurance preference. Use up to four times daily as directed. (prediabetes hypoglycemia)., Disp: 1 each, Rfl: 0 .  cetirizine (ZYRTEC) 10 MG tablet, Take 1 tablet (10 mg total) by mouth daily., Disp: 30 tablet, Rfl: 11 .  cholecalciferol (VITAMIN D) 1000 units tablet, Take 1,000 Units by mouth daily., Disp: , Rfl:  .  famotidine (PEPCID) 20 MG tablet, One at bedtime, Disp: 30 tablet, Rfl: 11 .  fluticasone (FLONASE) 50 MCG/ACT nasal spray, Place 2 sprays into both nostrils daily as needed for rhinitis. , Disp: , Rfl:  .  Lacosamide 100 MG TABS, Take 200 mg by mouth 2 (two) times daily. , Disp: , Rfl:  .  levETIRAcetam (KEPPRA) 500 MG tablet, Take 1,000 mg by mouth 2 (two) times daily. , Disp: , Rfl:  .  LORazepam (ATIVAN) 1 MG tablet, Take 1 mg by mouth every 6 (six) hours as needed for seizure. , Disp: , Rfl:  .  losartan (COZAAR) 25 MG tablet, TAKE 1 TABLET TWICE A DAY, Disp: 180 tablet, Rfl: 3 .  mirabegron ER (MYRBETRIQ) 25 MG TB24 tablet, Take 25 mg by mouth every evening. , Disp: , Rfl:  .   nitroGLYCERIN (NITROSTAT) 0.4 MG SL tablet, Place 1 tablet (0.4 mg total) under the tongue every 5 (five) minutes as needed for chest pain., Disp: 25 tablet, Rfl: 3 .  NON FORMULARY, CPAP Machine: authorized by [place] in [place], Disp: , Rfl:  .  pantoprazole (PROTONIX) 40 MG tablet, Take 1 tablet (40 mg total) by mouth daily. Take 30-60 min  before first meal of the day, Disp: 30 tablet, Rfl: 2 .  pyridOXINE (VITAMIN B-6) 100 MG tablet, Take 100 mg by mouth daily., Disp: , Rfl:  .  rosuvastatin (CRESTOR) 5 MG tablet, Take 1 tablet three times week and increase as tolerated, Disp: 45 tablet, Rfl: 2 .  Tamsulosin HCl (FLOMAX) 0.4 MG CAPS, Take 0.4 mg by mouth every evening. , Disp: , Rfl:  .  thiamine 100 MG tablet, Take 100 mg by mouth daily., Disp: , Rfl:  .  vitamin B-12 (CYANOCOBALAMIN) 1000 MCG tablet, Take 1,000 mcg by mouth daily., Disp: , Rfl:  .  benzonatate (TESSALON PERLES) 100 MG capsule, Take 1 capsule (100 mg total) by mouth 3 (three) times daily as needed., Disp: 20 capsule, Rfl: 0  EXAM:  Vitals:   04/14/18 1057  BP: 112/66  Pulse: 76  Temp: 98.5 F (36.9 C)  SpO2: 96%    Body mass index is 40.6 kg/m.  GENERAL: vitals reviewed and listed above, alert, oriented, appears well hydrated and in no acute distress  HEENT: atraumatic, conjunttiva clear, no obvious abnormalities on inspection of external nose and ears, normal appearance of ear canals and TMs, clear nasal congestion, mild post oropharyngeal erythema with PND, no tonsillar edema or exudate, no sinus TTP  NECK: no obvious masses on inspection  LUNGS: clear to auscultation bilaterally, no wheezes, rales or rhonchi, good air movement  CV: HRRR, no peripheral edema  MS: moves all extremities without noticeable abnormality  PSYCH: pleasant and cooperative, no obvious depression or anxiety  ASSESSMENT AND PLAN:  Discussed the following assessment and plan:  Viral upper respiratory illness  Cough  -given HPI and  exam findings today, a serious infection or illness is unlikely. We discussed potential etiologies, with VURI being most likely, and advised supportive care and monitoring. We discussed treatment side effects, likely course, antibiotic misuse, transmission, and signs of developing a serious illness. Tessalon for cough after discussion risks/benefits. -low threshold for re-eval advised if worsening, not starting to improve  - we advised to return or notify a doctor immediately if symptoms worsen or persist or new concerns arise.    Patient Instructions  INSTRUCTIONS FOR UPPER RESPIRATORY INFECTION:  -plenty of rest and fluids  -nasal saline wash 2-3 times daily (use prepackaged nasal saline or bottled/distilled water if making your own)   -can use tylenol as needed as directed for aches and sorethroat  -in the winter time, using a humidifier at night is helpful (please follow cleaning instructions)  -if you are taking a cough medication - use only as directed, may also try a teaspoon of honey to coat the throat and throat lozenges.  -for sore throat, salt water gargles can help  -follow up if you have fevers, facial pain, tooth pain, difficulty breathing or are worsening or symptoms do not improve over the next several days  Upper Respiratory Infection, Adult An upper respiratory infection (URI) is also known as the common cold. It is often caused by a type of germ (virus). Colds are easily spread (contagious). You can pass it to others by kissing, coughing, sneezing, or drinking out of the same glass. Usually, you get better in 1 to 3  weeks.  However, the cough can last for even longer. HOME CARE   Only take medicine as told by your doctor. Follow instructions provided above.  Drink enough water and fluids to keep your pee (urine) clear or pale yellow.  Get plenty of rest.  Return to  work when your temperature is < 100 for 24 hours or as told by your doctor. You may use a face mask  and wash your hands to stop your cold from spreading. GET HELP RIGHT AWAY IF:   After the first few days, you feel you are getting worse.  You have questions about your medicine.  You have chills, shortness of breath, or red spit (mucus).  You have pain in the face for more then 1-2 days, especially when you bend forward.  You have a fever, puffy (swollen) neck, pain when you swallow, or white spots in the back of your throat.  You have a bad headache, ear pain, sinus pain, or chest pain.  You have a high-pitched whistling sound when you breathe in and out (wheezing).  You cough up blood.  You have sore muscles or a stiff neck. MAKE SURE YOU:   Understand these instructions.  Will watch your condition.  Will get help right away if you are not doing well or get worse. Document Released: 12/07/2007 Document Revised: 09/12/2011 Document Reviewed: 09/25/2013 [facility] Patient Information 2015 [place], [place]. This information is not intended to replace advice given to you by your health care provider. Make sure you discuss any questions you have with your health care provider.    Lucretia Kern, DO

## 2018-04-16 ENCOUNTER — Ambulatory Visit: Payer: Medicare Other | Admitting: Internal Medicine

## 2018-04-17 NOTE — Progress Notes (Signed)
Subjective:    Patient ID: Jeff Weaver, male    DOB: Dec 16, 1945, 72 y.o.   MRN: [id number]  HPI The patient is here for follow up.  Cold symptoms:  One week ago his symptoms started.  He was seen last Saturday for this and it was thought that it was just a viral illness.  His symptoms have gotten worse since then.  He has been coughing up discolored sputum.  He has had some wheezing and is chronic shortness of breath is worse than usual.  He states some intermittent sore throat, mild nasal congestion and sinus pressure.  He has not had any fevers or chills, ear pain, headaches or lightheadedness.  He has been taking the [employer] and thinks they do help.  Hypertension: He is taking his medication daily. He is compliant with a low sodium diet.  He has had some mild leg swelling recently and gets that on occasion.  He has chronic shortness of breath.  He denies chest pain, palpitations, and regular headaches. He is not exercising regularly.  His wife checks his BP at home.    Prediabetes:  He is somewhat compliant with a low sugar/carbohydrate diet.  He is not exercising regularly.  Ho CVA, seizure d/o, memory difficulties, poor balance:  He is following with neurology.  He uses a cane to ambulate.    He is sleepy all the time.  Some of this is his medication.  His wife did call neurosurgery for a follow-up appointment to see if his VP shunt setting is correct.  She is knows that this could cause some increased fatigue.  He is using his cpap nightly.    GERD:  He is taking his medication daily as prescribed.  He denies any GERD symptoms and feels his GERD is well controlled.    Medications and allergies reviewed with patient and updated if appropriate.  Patient Active Problem List   Diagnosis Date Noted  . Upper airway cough syndrome vs cough variant asthma 03/20/2018  . Acute midline low back pain without sciatica 12/25/2017  . Depression 10/17/2017  . CAD (coronary artery  disease) 08/15/2017  . Chest pain 07/17/2017  . Dyspnea on exertion 06/08/2017  . Dysphagia 05/31/2017  . Osteoporosis 01/30/2017  . History of cerebral hemorrhage 12/28/2016  . Seborrheic dermatitis 07/25/2016  . Cognitive communication deficit 06/28/2016  . Hypertensive heart disease 06/28/2016  . Vitamin B 12 deficiency 03/16/2016  . Memory difficulties 03/13/2016  . Confusion 03/13/2016  . 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 Orthostatic hypotension 01/05/2016  . Benign prostatic hyperplasia with urinary obstruction 08/04/2014  . Urinary urgency 08/04/2014  . Gastroesophageal reflux disease 07/14/2014  . Polymyalgia rheumatica ([place]) 05/28/2014  . 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 Acute non-ST-elevation myocardial infarction ([place]) 11/25/2013  . SBE (subacute bacterial endocarditis) prophylaxis candidate 11/24/2013  . Aphasia 07/17/2013  . 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 seizures, not intractable, without status epilepticus ([place]) 07/17/2013  . Subdural hemorrhage ([place]) 07/17/2013  . 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[place]) 07/17/2013  . Pre-diabetes 03/26/2013  . Increased frequency of urination 10/31/2012  . Nocturia 10/31/2012  . History of cerebral aneurysm repair 10/18/2012  . History of stroke without residual deficits 10/18/2012  . Blepharitis of both eyes 09/27/2012  . 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 Seizure disorder ([place]) 07/23/2012  . Entropion 07/10/2012  . Hyperopia with astigmatism 07/10/2012  . Complex partial seizures with consciousness impaired ([place]) 04/16/2012  . Combined senile cataract 04/13/2012  .  Presbyopia 04/13/2012  . 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 artery ([place]) 03/01/2012  . Cognitive safety issue 02/20/2012  . Nonruptured cerebral aneurysm 11/23/2011  . Obstructive sleep apnea syndrome 08/09/2011  . Hyperlipidemia 02/03/2009  . HTN (hypertension) 02/03/2009  . First degree  atrioventricular block 02/03/2009  . OSTEOARTHRITIS 02/03/2009  . REGIONAL ENTERITIS, LARGE INTESTINE 04/17/2008    Current Outpatient Medications on File Prior to Visit  Medication Sig Dispense Refill  . acetaminophen (TYLENOL) 325 MG tablet Take 325-650 mg by mouth every 6 (six) hours as needed for headache.    Marland Kitchen amoxicillin (AMOXIL) 500 MG capsule Take 2,000 mg by mouth as directed. Take 2000 mg by mouth 1 hour prior to dental appointments    . aspirin EC 81 MG tablet Take 1 tablet (81 mg total) by mouth daily. 90 tablet 3  . benzonatate (TESSALON PERLES) 100 MG capsule Take 1 capsule (100 mg total) by mouth 3 (three) times daily as needed. 20 capsule 0  . blood glucose meter kit and supplies KIT Dispense based on patient and insurance preference. Use up to four times daily as directed. (prediabetes hypoglycemia). 1 each 0  . cetirizine (ZYRTEC) 10 MG tablet Take 1 tablet (10 mg total) by mouth daily. 30 tablet 11  . cholecalciferol (VITAMIN D) 1000 units tablet Take 1,000 Units by mouth daily.    . famotidine (PEPCID) 20 MG tablet One at bedtime 30 tablet 11  . fluticasone (FLONASE) 50 MCG/ACT nasal spray Place 2 sprays into both nostrils daily as needed for rhinitis.     . Lacosamide 100 MG TABS Take 200 mg by mouth 2 (two) times daily.     Marland Kitchen levETIRAcetam (KEPPRA) 500 MG tablet Take 1,000 mg by mouth 2 (two) times daily.     Marland Kitchen LORazepam (ATIVAN) 1 MG tablet Take 1 mg by mouth every 6 (six) hours as needed for seizure.     Marland Kitchen losartan (COZAAR) 25 MG tablet TAKE 1 TABLET TWICE A DAY 180 tablet 3  . mirabegron ER (MYRBETRIQ) 25 MG TB24 tablet Take 25 mg by mouth every evening.     . nitroGLYCERIN (NITROSTAT) 0.4 MG SL tablet Place 1 tablet (0.4 mg total) under the tongue every 5 (five) minutes as needed for chest pain. 25 tablet 3  . NON FORMULARY CPAP Machine: authorized by [place] in [place]    . pantoprazole (PROTONIX) 40 MG tablet Take 1 tablet (40 mg total) by mouth daily. Take 30-60 min  before first meal of the day 30 tablet 2  . pyridOXINE (VITAMIN B-6) 100 MG tablet Take 100 mg by mouth daily.    . rosuvastatin (CRESTOR) 5 MG tablet Take 1 tablet three times week and increase as tolerated 45 tablet 2  . Tamsulosin HCl (FLOMAX) 0.4 MG CAPS Take 0.4 mg by mouth every evening.     . thiamine 100 MG tablet Take 100 mg by mouth daily.    . vitamin B-12 (CYANOCOBALAMIN) 1000 MCG tablet Take 1,000 mcg by mouth daily.     No current facility-administered medications on file prior to visit.     Past Medical History:  Diagnosis Date  . Aortic insufficiency    a. mild 2017 echo.  . Cerebral aneurysm    a. s/p clipping at [facility] in 8/13 c/b short-term memory loss, cerebral hemorrhage and seizure disorder s/p VP shunt.  . Cerebral hemorrhage ([place])   . Chronic diastolic CHF (congestive heart failure) ([place])   . CVA (cerebral infarction)   .  Diverticulosis   . Enteritis (regional)    Dr Olevia Perches  . First degree AV block   . GI bleed   . HTN (hypertension)   . Hyperlipidemia   . Hypertensive heart disease   . IBS (irritable bowel syndrome)   . Mild CAD    Cardiac cath 01/12/18 showed minimal nonobstructive CAD, normal LVEF, normal LVEDP.  . Orthostatic hypotension   . Pre-diabetes   . 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 Rheumatoid arthritis(714.0)    dxed in [employer] in 1980s  . Second degree AV block, Mobitz type I   . Seizures ([place])   . Sinus bradycardia   . Sleep apnea     Past Surgical History:  Procedure Laterality Date  . CHOLECYSTECTOMY N/A 01/07/2016   Procedure: LAPAROSCOPIC CHOLECYSTECTOMY ;  Surgeon: Coralie Keens, MD;  Location: [facility];  Service: General;  Laterality: N/A;  . cns shunt  02/23/12  . COLONOSCOPY W/ POLYPECTOMY  1978   negative 2009, Dr Olevia Perches. Due 2014  . CRANIOTOMY  02/02/12   Dr Harvel Ricks, [place] of aneurysm  . CRANIOTOMY  02-02-12   left pterional craniotomy for clipping complex anterior communicating artery aneurysm     . HERNIA REPAIR    . LEFT HEART CATH AND CORONARY ANGIOGRAPHY N/A 01/12/2018   Procedure: LEFT HEART CATH AND CORONARY ANGIOGRAPHY;  Surgeon: Martinique, Peter M, MD;  Location: [facility];  Service: Cardiovascular;  Laterality: N/A;  . LEFT HEART CATHETERIZATION WITH CORONARY ANGIOGRAM N/A 11/26/2013   Procedure: LEFT HEART CATHETERIZATION WITH CORONARY ANGIOGRAM;  Surgeon: [name], MD;  Location: [facility] CATH LAB;  Service: Cardiovascular;  Laterality: N/A;  . NOSE SURGERY    . SHOULDER SURGERY  1997  . TONSILLECTOMY AND ADENOIDECTOMY    . VENTRICULOPERITONEAL SHUNT  02-23-12   INSERTION OF RIGHT FRONTAL VENTRICULOPERITONEAL SHUNT WITH A CODMAN HAKIM PROGRAMMABLE VALVE    Social History   Socioeconomic History  . Marital status: Married    Spouse name: Not on file  . Number of children: 1  . Years of education: Not on file  . Highest education level: Not on file  Occupational History  . Occupation: retired  Scientific laboratory technician  . Financial resource strain: Not on file  . Food insecurity:    Worry: Not on file    Inability: Not on file  . Transportation needs:    Medical: Not on file    Non-medical: Not on file  Tobacco Use  . Smoking status: Former Smoker    Packs/day: 0.00    Years: 30.00    Pack years: 0.00    Types: Pipe    Last attempt to quit: 07/04/2008    Years since quitting: 9.7  . Smokeless tobacco: Former Network engineer and Sexual Activity  . Alcohol use: Yes    Alcohol/week: 0.0 standard drinks    Comment: Very Infrequently   . Drug use: No  . Sexual activity: Not on file  Lifestyle  . Physical activity:    Days per week: Not on file    Minutes per session: Not on file  . Stress: Not on file  Relationships  . Social connections:    Talks on phone: Not on file    Gets together: Not on file    Attends religious service: Not on file    Active member of club or organization: Not on file    Attends meetings of clubs or organizations: Not on file  Relationship status: Not on file  Other Topics Concern  . Not on file  Social History Narrative  . Not on file    Family History  Problem Relation Age of Onset  . COPD Father   . Coronary artery disease Father        MI in 66s  . Hepatitis Mother   . Diabetes Sister   . Dementia Sister 20  . Diabetes Brother   . Colon cancer Neg Hx     Review of Systems  Constitutional: Negative for chills and fever.  HENT: Positive for congestion (mild), sinus pressure and sore throat (mild at times). Negative for ear pain and sinus pain.   Respiratory: Positive for cough (productive of discolored sputum), shortness of breath (worse than usual) and wheezing. Negative for chest tightness.   Cardiovascular: Positive for leg swelling (mild recently). Negative for chest pain and palpitations.  Gastrointestinal: Negative for nausea.       No gerd  Neurological: Negative for light-headedness and headaches.       Objective:   Vitals:   04/18/18 0918  BP: 122/70  Pulse: (!) 52  Resp: 18  Temp: 98.5 F (36.9 C)  SpO2: 95%   BP Readings from Last 3 Encounters:  04/18/18 122/70  04/14/18 112/66  03/19/18 104/64   Wt Readings from Last 3 Encounters:  04/18/18 267 lb (121.1 kg)  04/14/18 267 lb (121.1 kg)  03/19/18 264 lb (119.7 kg)   Body mass index is 40.6 kg/m.   Physical Exam    Constitutional: Appears well-developed and well-nourished. No distress.  HENT:  HEENT: Normocephalic and atraumatic.  Neck supple. No tracheal deviation present. No thyromegaly present.  Oropharynx without erythema.  No cervical lymphadenopathy Cardiovascular: Normal rate, regular rhythm and normal heart sounds.   No murmur heard. No carotid bruit .  Trace bilateral lower extremity edema Pulmonary/Chest: Effort normal No respiratory distress.  Mild expiratory wheeze. No rales.  Skin: Skin is warm and dry. Not diaphoretic.  Psychiatric: Normal mood and affect. Behavior is normal.      Assessment &  Plan:    See Problem List for Assessment and Plan of chronic medical problems.

## 2018-04-17 NOTE — Patient Instructions (Addendum)
  Tests ordered today. Your results will be released to [facility] (or called to you) after review, usually within 72hours after test completion. If any changes need to be made, you will be notified at that same time.   Medications reviewed and updated.  Changes include : doxycycline for the infection.     Your prescription(s) have been submitted to your pharmacy. Please take as directed and contact our office if you believe you are having problem(s) with the medication(s).   Please followup in 6 months

## 2018-04-18 ENCOUNTER — Encounter: Payer: Self-pay | Admitting: Internal Medicine

## 2018-04-18 ENCOUNTER — Ambulatory Visit (INDEPENDENT_AMBULATORY_CARE_PROVIDER_SITE_OTHER)
Admission: RE | Admit: 2018-04-18 | Discharge: 2018-04-18 | Disposition: A | Discharge status: Home / Self Care | Payer: Medicare Other | Source: Ambulatory Visit | Attending: Internal Medicine | Admitting: Internal Medicine

## 2018-04-18 ENCOUNTER — Ambulatory Visit (INDEPENDENT_AMBULATORY_CARE_PROVIDER_SITE_OTHER): Payer: Medicare Other | Admitting: Internal Medicine

## 2018-04-18 ENCOUNTER — Other Ambulatory Visit (INDEPENDENT_AMBULATORY_CARE_PROVIDER_SITE_OTHER): Payer: Medicare Other

## 2018-04-18 VITALS — BP 122/70 | HR 52 | Temp 98.5°F | Resp 18 | Ht 68.0 in | Wt 267.0 lb

## 2018-04-18 DIAGNOSIS — J40 Bronchitis, not specified as acute or chronic: Secondary | ICD-10-CM

## 2018-04-18 DIAGNOSIS — K219 Gastro-esophageal reflux disease without esophagitis: Secondary | ICD-10-CM

## 2018-04-18 DIAGNOSIS — I10 Essential (primary) hypertension: Secondary | ICD-10-CM

## 2018-04-18 DIAGNOSIS — I2510 Atherosclerotic heart disease of native coronary artery without angina pectoris: Secondary | ICD-10-CM

## 2018-04-18 DIAGNOSIS — R05 Cough: Secondary | ICD-10-CM | POA: Diagnosis not present

## 2018-04-18 DIAGNOSIS — R7303 Prediabetes: Secondary | ICD-10-CM | POA: Diagnosis not present

## 2018-04-18 LAB — COMPREHENSIVE METABOLIC PANEL
ALT: 32 U/L (ref 0–53)
AST: 18 U/L (ref 0–37)
Albumin: 4.1 g/dL (ref 3.5–5.2)
Alkaline Phosphatase: 48 U/L (ref 39–117)
BUN: 13 mg/dL (ref 6–23)
CO2: 32 mEq/L (ref 19–32)
Calcium: 9.7 mg/dL (ref 8.4–10.5)
Chloride: 99 mEq/L (ref 96–112)
Creatinine, Ser: 1.02 mg/dL (ref 0.40–1.50)
GFR: 75.9 mL/min (ref >60.00)
Glucose, Bld: 118 mg/dL — ABNORMAL HIGH (ref 70–99)
Potassium: 4.8 mEq/L (ref 3.5–5.1)
Sodium: 136 mEq/L (ref 135–145)
Total Bilirubin: 0.4 mg/dL (ref 0.2–1.2)
Total Protein: 7.2 g/dL (ref 6.0–8.3)

## 2018-04-18 LAB — CBC WITH DIFFERENTIAL/PLATELET
Basophils Absolute: 0.1 10*3/uL (ref 0.0–0.1)
Basophils Relative: 0.6 % (ref 0.0–3.0)
Eosinophils Absolute: 0.3 10*3/uL (ref 0.0–0.7)
Eosinophils Relative: 3.3 % (ref 0.0–5.0)
HCT: 44 % (ref 39.0–52.0)
Hemoglobin: 15.1 g/dL (ref 13.0–17.0)
Lymphocytes Relative: 17.8 % (ref 12.0–46.0)
Lymphs Abs: 1.5 10*3/uL (ref 0.7–4.0)
MCHC: 34.4 g/dL (ref 30.0–36.0)
MCV: 91.6 fl (ref 78.0–100.0)
Monocytes Absolute: 0.8 10*3/uL (ref 0.1–1.0)
Monocytes Relative: 8.7 % (ref 3.0–12.0)
Neutro Abs: 6 10*3/uL (ref 1.4–7.7)
Neutrophils Relative %: 69.6 % (ref 43.0–77.0)
Platelets: 169 10*3/uL (ref 150.0–400.0)
RBC: 4.81 Mil/uL (ref 4.22–5.81)
RDW: 13.4 % (ref 11.5–15.5)
WBC: 8.6 10*3/uL (ref 4.0–10.5)

## 2018-04-18 LAB — LIPID PANEL
Cholesterol: 131 mg/dL (ref 0–200)
HDL: 40.7 mg/dL (ref >39.00)
LDL Cholesterol: 64 mg/dL (ref 0–99)
NonHDL: 90.3
Total CHOL/HDL Ratio: 3
Triglycerides: 131 mg/dL (ref 0.0–149.0)
VLDL: 26.2 mg/dL (ref 0.0–40.0)

## 2018-04-18 LAB — HEMOGLOBIN A1C: Hgb A1c MFr Bld: 6.2 % (ref 4.6–6.5)

## 2018-04-18 MED ORDER — DOXYCYCLINE HYCLATE 100 MG PO TABS: 100.0000 mg | ORAL_TABLET | Freq: Two times a day (BID) | ORAL | 0 refills | Status: DC

## 2018-04-18 MED ORDER — BENZONATATE 100 MG PO CAPS: 100.0000 mg | ORAL_CAPSULE | Freq: Three times a day (TID) | ORAL | 0 refills | Status: DC | PRN

## 2018-04-18 MED FILL — BENZONATATE 100 MG CAPSULE: 100 | 10 days supply | Qty: 30 | Fill #0

## 2018-04-18 MED FILL — DOXYCYCLINE HYCLATE 100 MG: 100 | 10 days supply | Qty: 20 | Fill #0

## 2018-04-18 NOTE — Assessment & Plan Note (Signed)
BP well controlled Current regimen effective and well tolerated Continue current medications at current doses cmp  

## 2018-04-18 NOTE — Assessment & Plan Note (Signed)
Check a1c Low sugar / carb diet Stressed regular exercise   

## 2018-04-18 NOTE — Assessment & Plan Note (Signed)
Current symptoms consistent with bacterial bronchitis Will start doxycycline Chest x-ray given wheezing to rule out early pneumonia Continue Tessalon Perles for cough Okay to take over-the-counter cold medications Call if no improvement

## 2018-04-18 NOTE — Assessment & Plan Note (Signed)
GERD controlled Continue daily medication  

## 2018-04-23 ENCOUNTER — Ambulatory Visit: Payer: Medicare Other | Admitting: Internal Medicine

## 2018-04-24 ENCOUNTER — Other Ambulatory Visit: Payer: Self-pay | Admitting: Internal Medicine

## 2018-05-02 DIAGNOSIS — Z982 Presence of cerebrospinal fluid drainage device: Secondary | ICD-10-CM | POA: Diagnosis not present

## 2018-05-02 DIAGNOSIS — J338 Other polyp of sinus: Secondary | ICD-10-CM | POA: Diagnosis not present

## 2018-05-02 DIAGNOSIS — R410 Disorientation, unspecified: Secondary | ICD-10-CM | POA: Diagnosis not present

## 2018-05-02 DIAGNOSIS — R4689 Other symptoms and signs involving appearance and behavior: Secondary | ICD-10-CM | POA: Diagnosis not present

## 2018-05-02 DIAGNOSIS — Z87891 Personal history of nicotine dependence: Secondary | ICD-10-CM | POA: Diagnosis not present

## 2018-05-02 DIAGNOSIS — R51 Headache: Secondary | ICD-10-CM | POA: Diagnosis not present

## 2018-05-02 DIAGNOSIS — R5383 Other fatigue: Secondary | ICD-10-CM | POA: Diagnosis not present

## 2018-05-04 ENCOUNTER — Ambulatory Visit: Payer: Medicare Other

## 2018-05-07 ENCOUNTER — Ambulatory Visit: Payer: Medicare Other | Admitting: Internal Medicine

## 2018-05-07 ENCOUNTER — Encounter: Payer: Self-pay | Admitting: Cardiology

## 2018-05-08 DIAGNOSIS — R413 Other amnesia: Secondary | ICD-10-CM | POA: Diagnosis not present

## 2018-05-08 DIAGNOSIS — Z8679 Personal history of other diseases of the circulatory system: Secondary | ICD-10-CM | POA: Diagnosis not present

## 2018-05-08 DIAGNOSIS — R451 Restlessness and agitation: Secondary | ICD-10-CM | POA: Diagnosis not present

## 2018-05-08 DIAGNOSIS — R410 Disorientation, unspecified: Secondary | ICD-10-CM | POA: Diagnosis not present

## 2018-05-08 DIAGNOSIS — R400 Somnolence: Secondary | ICD-10-CM | POA: Diagnosis not present

## 2018-05-08 DIAGNOSIS — G40109 Localization-related (focal) (partial) symptomatic epilepsy and epileptic syndromes with simple partial seizures, not intractable, without status epilepticus: Secondary | ICD-10-CM | POA: Diagnosis not present

## 2018-05-08 DIAGNOSIS — Z982 Presence of cerebrospinal fluid drainage device: Secondary | ICD-10-CM | POA: Diagnosis not present

## 2018-05-08 DIAGNOSIS — Z659 Problem related to unspecified psychosocial circumstances: Secondary | ICD-10-CM | POA: Diagnosis not present

## 2018-05-08 DIAGNOSIS — Z9889 Other specified postprocedural states: Secondary | ICD-10-CM | POA: Diagnosis not present

## 2018-05-08 DIAGNOSIS — Z8673 Personal history of transient ischemic attack (TIA), and cerebral infarction without residual deficits: Secondary | ICD-10-CM | POA: Diagnosis not present

## 2018-05-08 DIAGNOSIS — G40209 Localization-related (focal) (partial) symptomatic epilepsy and epileptic syndromes with complex partial seizures, not intractable, without status epilepticus: Secondary | ICD-10-CM | POA: Diagnosis not present

## 2018-05-09 MED FILL — NYSTATIN 100,000 UNIT/GM PO: 100000 | 15 days supply | Qty: 30 | Fill #1

## 2018-05-09 MED FILL — LOTRIMIN AF POWDER 2 % AERP: 2 | 30 days supply | Qty: 133 | Fill #2

## 2018-05-10 MED FILL — AMOXICILLIN 875 MG TABS: 875 | 5 days supply | Qty: 10 | Fill #0

## 2018-05-11 ENCOUNTER — Ambulatory Visit: Payer: Medicare Other

## 2018-05-17 ENCOUNTER — Telehealth: Payer: Self-pay | Admitting: *Deleted

## 2018-05-17 ENCOUNTER — Ambulatory Visit (INDEPENDENT_AMBULATORY_CARE_PROVIDER_SITE_OTHER): Payer: Medicare Other | Admitting: *Deleted

## 2018-05-17 VITALS — BP 132/62 | HR 64 | Resp 17 | Ht 68.0 in | Wt 268.0 lb

## 2018-05-17 DIAGNOSIS — Z0000 Encounter for general adult medical examination without abnormal findings: Secondary | ICD-10-CM

## 2018-05-17 DIAGNOSIS — Z23 Encounter for immunization: Secondary | ICD-10-CM | POA: Diagnosis not present

## 2018-05-17 DIAGNOSIS — M25512 Pain in left shoulder: Secondary | ICD-10-CM

## 2018-05-17 NOTE — Telephone Encounter (Signed)
Referral ordered for sports med - dr Tamala Julian  Please schedule

## 2018-05-17 NOTE — Telephone Encounter (Signed)
During AWV, patient stated that his left shoulder has pain and he is experiencing decrease ROM. He asked if he can have a referral to sports medicine or orthopedics.

## 2018-05-17 NOTE — Patient Instructions (Addendum)
Continue doing brain stimulating activities (puzzles, reading, adult coloring books, staying active) to keep memory sharp.   Continue to eat heart healthy diet (full of fruits, vegetables, whole grains, lean protein, water--limit salt, fat, and sugar intake) and increase physical activity as tolerated.   Jeff Weaver , Thank you for taking time to come for your Medicare Wellness Visit. I appreciate your ongoing commitment to your health goals. Please review the following plan we discussed and let me know if I can assist you in the future.   These are the goals we discussed: Goals    . Be as active and as independent as possible     Get better organized by continuing to use my index cards, increased activity by walking and washing my car, monitor diet closer.    . Patient Stated     Start to exercise by doing chair exercises several times a week.       This is a list of the screening recommended for you and due dates:  Health Maintenance  Topic Date Due  . Colon Cancer Screening  04/10/2018  . Flu Shot  11/01/2018*  . DEXA scan (bone density measurement)  10/22/2018  . Tetanus Vaccine  07/24/2021  .  Hepatitis C: One time screening is recommended by Center for Disease Control  (CDC) for  adults born from [birth year] through 1967.   Completed  . Pneumonia vaccines  Completed  *Topic was postponed. The date shown is not the original due date.    Insomnia Insomnia is a sleep disorder that makes it difficult to fall asleep or to stay asleep. Insomnia can cause tiredness (fatigue), low energy, difficulty concentrating, mood swings, and poor performance at work or school. There are three different ways to classify insomnia:  Difficulty falling asleep.  Difficulty staying asleep.  Waking up too early in the morning.  Any type of insomnia can be long-term (chronic) or short-term (acute). Both are common. Short-term insomnia usually lasts for three months or less. Chronic insomnia occurs at  least three times a week for longer than three months. What are the causes? Insomnia may be caused by another condition, situation, or substance, such as:  Anxiety.  Certain medicines.  Gastroesophageal reflux disease (GERD) or other gastrointestinal conditions.  Asthma or other breathing conditions.  Restless legs syndrome, sleep apnea, or other sleep disorders.  Chronic pain.  Menopause. This may include hot flashes.  Stroke.  Abuse of alcohol, tobacco, or illegal drugs.  Depression.  Caffeine.  Neurological disorders, such as Alzheimer disease.  An overactive thyroid (hyperthyroidism).  The cause of insomnia may not be known. What increases the risk? Risk factors for insomnia include:  Gender. Women are more commonly affected than men.  Age. Insomnia is more common as you get older.  Stress. This may involve your professional or personal life.  Income. Insomnia is more common in people with lower income.  Lack of exercise.  Irregular work schedule or night shifts.  Traveling between different time zones.  What are the signs or symptoms? If you have insomnia, trouble falling asleep or trouble staying asleep is the main symptom. This may lead to other symptoms, such as:  Feeling fatigued.  Feeling nervous about going to sleep.  Not feeling rested in the morning.  Having trouble concentrating.  Feeling irritable, anxious, or depressed.  How is this treated? Treatment for insomnia depends on the cause. If your insomnia is caused by an underlying condition, treatment will focus on addressing the condition.  Treatment may also include:  Medicines to help you sleep.  Counseling or therapy.  Lifestyle adjustments.  Follow these instructions at home:  Take medicines only as directed by your health care provider.  Keep regular sleeping and waking hours. Avoid naps.  Keep a sleep diary to help you and your health care provider figure out what could  be causing your insomnia. Include: ? When you sleep. ? When you wake up during the night. ? How well you sleep. ? How rested you feel the next day. ? Any side effects of medicines you are taking. ? What you eat and drink.  Make your bedroom a comfortable place where it is easy to fall asleep: ? Put up shades or special blackout curtains to block light from outside. ? Use a white noise machine to block noise. ? Keep the temperature cool.  Exercise regularly as directed by your health care provider. Avoid exercising right before bedtime.  Use relaxation techniques to manage stress. Ask your health care provider to suggest some techniques that may work well for you. These may include: ? Breathing exercises. ? Routines to release muscle tension. ? Visualizing peaceful scenes.  Cut back on alcohol, caffeinated beverages, and cigarettes, especially close to bedtime. These can disrupt your sleep.  Do not overeat or eat spicy foods right before bedtime. This can lead to digestive discomfort that can make it hard for you to sleep.  Limit screen use before bedtime. This includes: ? Watching TV. ? Using your smartphone, tablet, and computer.  Stick to a routine. This can help you fall asleep faster. Try to do a quiet activity, brush your teeth, and go to bed at the same time each night.  Get out of bed if you are still awake after 15 minutes of trying to sleep. Keep the lights down, but try reading or doing a quiet activity. When you feel sleepy, go back to bed.  Make sure that you drive carefully. Avoid driving if you feel very sleepy.  Keep all follow-up appointments as directed by your health care provider. This is important. Contact a health care provider if:  You are tired throughout the day or have trouble in your daily routine due to sleepiness.  You continue to have sleep problems or your sleep problems get worse. Get help right away if:  You have serious thoughts about hurting  yourself or someone else. This information is not intended to replace advice given to you by your health care provider. Make sure you discuss any questions you have with your health care provider. Document Released: 06/17/2000 Document Revised: 11/20/2015 Document Reviewed: 03/21/2014 Elsevier Interactive Patient Education  [employer].

## 2018-05-17 NOTE — Progress Notes (Addendum)
Subjective:   Jeff Weaver is a 72 y.o. male who presents for Medicare Annual/Subsequent preventive examination.  Review of Systems:  No ROS.  Medicare Wellness Visit. Additional risk factors are reflected in the social history.  Cardiac Risk Factors include: advanced age (>53mn, >>57women);male gender;hypertension;obesity (BMI >30kg/m2) Sleep patterns: feels rested on waking, gets up 1-2  times nightly to void and sleeps 7-8 hours nightly.    Home Safety/Smoke Alarms: Feels safe in home. Smoke alarms in place.  Living environment; residence and Firearm Safety: 2-story house, equipment: CRadio producer Type: [place] no firearms. Seat Belt Safety/Bike Helmet: Wears seat belt.      Objective:    Vitals: BP 132/62   Pulse 64   Resp 17   Ht 5' 8"  (1.727 m)   Wt 268 lb (121.6 kg)   SpO2 98%   BMI 40.75 kg/m   Body mass index is 40.75 kg/m.  Advanced Directives 05/17/2018 01/12/2018 04/28/2017 01/03/2017 01/05/2016 01/04/2016 04/16/2014  Does Patient Have a Medical Advance Directive? No Yes No No Yes Yes Yes  Type of Advance Directive - HPress photographer- - HCatering manager-  Does patient want to make changes to medical advance directive? - No - Patient declined - - - - -  Copy of HG[place] Chart? - No - copy requested - - - - -  Would patient like information on creating a medical advance directive? Yes (Inpatient - patient requests chaplain consult to create a medical advance directive) No - Patient declined Yes (ED - Information included in AVS) No - Patient declined - - -    Tobacco Social History   Tobacco Use  Smoking Status Former Smoker  . Packs/day: 0.00  . Years: 30.00  . Pack years: 0.00  . Types: Pipe  . Last attempt to quit: 07/04/2008  . Years since quitting: 9.8  Smokeless Tobacco Former UEngineer, structuralgiven: Not Answered  Past Medical History:  Diagnosis Date  . Aortic  insufficiency    a. mild 2017 echo.  . Cerebral aneurysm    [facility]b short-term memory loss, cerebral hemorrhage and seizure disorder s/p VP shunt.  . [place]   . Chronic diastolic CHF (congestive heart failure) ([place]   . CVA (cerebral infarction)   . Diverticulosis   . Enteritis (regional)    Dr BOlevia Perches . First degree AV block   . GI bleed   . HTN (hypertension)   . Hyperlipidemia   . Hypertensive heart disease   . IBS (irritable bowel syndrome)   . Mild CAD    Cardiac cath 01/12/18 showed minimal nonobstructive CAD, normal LVEF, normal LVEDP.  . Orthostatic hypotension   . Pre-diabetes   . Regional enteritis of large intestine ([place] since [place]  . Rheumatoid arthritis(714.0)    dxed in [employer] in 1980s  . Second degree AV block, Mobitz type I   . Seizures ([place]   . Sinus bradycardia   . Sleep apnea    Past Surgical History:  Procedure Laterality Date  . CHOLECYSTECTOMY N/A 01/07/2016   Procedure: LAPAROSCOPIC CHOLECYSTECTOMY ;  Surgeon: DCoralie Keens MD;  [place]  Service: General;  Laterality: N/A;  . cns shunt  02/23/12  . COLONOSCOPY W/ POLYPECTOMY  1978   negative 2009, Dr BOlevia Perches Due 2014  . CRANIOTOMY  02/02/12   [place]  [place]  . 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 HERNIA REPAIR    . LEFT HEART CATH AND CORONARY ANGIOGRAPHY N/A 01/12/2018   Procedure: LEFT HEART CATH AND CORONARY ANGIOGRAPHY;  Surgeon: Martinique, Peter M, MD;  Location: [place] CV LAB;  Service: Cardiovascular;  Laterality: N/A;  . LEFT HEART CATHETERIZATION WITH CORONARY ANGIOGRAM N/A 11/26/2013   Procedure: LEFT HEART CATHETERIZATION WITH CORONARY ANGIOGRAM;  Surgeon: [name], MD;  Location[facility] CATH LAB;  Service: Cardiovascular;  Laterality: N/A;  . NOSE SURGERY    . SHOULDER SURGERY  1997  . TONSILLECTOMY AND ADENOIDECTOMY    .  VENTRICULOPERITONEAL SHUNT  02-23-12   INSERTION OF RIGHT FRONTAL VENTRICULOPERITONEAL SHUNT WITH A CODMAN HAKIM PROGRAMMABLE VALVE   Family History  Problem Relation Age of Onset  . COPD Father   . Coronary artery disease Father        MI in 14s  . Hepatitis Mother   . Diabetes Sister   . Dementia Sister 51  . Diabetes Brother   . Colon cancer Neg Hx    Social History   Socioeconomic History  . Marital status: Married    Spouse name: Not on file  . Number of children: 1  . Years of education: Not on file  . Highest education level: Not on file  Occupational History  . Occupation: retired  Scientific laboratory technician  . Financial resource strain: Not hard at all  . Food insecurity:    Worry: Never true    Inability: Never true  . Transportation needs:    Medical: No    Non-medical: No  Tobacco Use  . Smoking status: Former Smoker    Packs/day: 0.00    Years: 30.00    Pack years: 0.00    Types: Pipe    Last attempt to quit: 07/04/2008    Years since quitting: 9.8  . Smokeless tobacco: Former Network engineer and Sexual Activity  . Alcohol use: Yes    Alcohol/week: 0.0 standard drinks    Comment: Very Infrequently   . Drug use: No  . Sexual activity: Not Currently  Lifestyle  . Physical activity:    Days per week: 0 days    Minutes per session: 0 min  . Stress: Not at all  Relationships  . Social connections:    Talks on phone: More than three times a week    Gets together: More than three times a week    Attends religious service: Never    Active member of club or organization: Not on file    Attends meetings of clubs or organizations: Never    Relationship status: Not on file  Other Topics Concern  . Not on file  Social History Narrative  . Not on file    Outpatient Encounter Medications as of 05/17/2018  Medication Sig  . acetaminophen (TYLENOL) 325 MG tablet Take 325-650 mg by mouth every 6 (six) hours as needed for headache.  Marland Kitchen amoxicillin (AMOXIL) 500 MG capsule  Take 2,000 mg by mouth as directed. Take 2000 mg by mouth 1 hour prior to dental appointments  . aspirin EC 81 MG tablet Take 1 tablet (81 mg total) by mouth daily.  . blood glucose meter kit and supplies KIT Dispense based on patient and insurance preference. Use up to four times daily as directed. (prediabetes hypoglycemia).  . cetirizine (ZYRTEC) 10 MG tablet Take 1 tablet (10 mg total)  by mouth daily.  . cholecalciferol (VITAMIN D) 1000 units tablet Take 1,000 Units by mouth daily.  . famotidine (PEPCID) 20 MG tablet One at bedtime  . fluticasone (FLONASE) 50 MCG/ACT nasal spray Place 2 sprays into both nostrils daily as needed for rhinitis.   . Lacosamide 100 MG TABS Take 200 mg by mouth 2 (two) times daily.   Marland Kitchen levETIRAcetam (KEPPRA) 500 MG tablet Take 1,000 mg by mouth 2 (two) times daily.   Marland Kitchen LORazepam (ATIVAN) 1 MG tablet Take 1 mg by mouth every 6 (six) hours as needed for seizure.   Marland Kitchen losartan (COZAAR) 25 MG tablet TAKE 1 TABLET TWICE A DAY  . mirabegron ER (MYRBETRIQ) 25 MG TB24 tablet Take 25 mg by mouth every evening.   . nitroGLYCERIN (NITROSTAT) 0.4 MG SL tablet Place 1 tablet (0.4 mg total) under the tongue every 5 (five) minutes as needed for chest pain.  . NON FORMULARY CPAP Machine: authorized by [facility] in [place]  . pantoprazole (PROTONIX) 40 MG tablet Take 1 tablet (40 mg total) by mouth daily. Take 30-60 min before first meal of the day  . pyridOXINE (VITAMIN B-6) 100 MG tablet Take 100 mg by mouth daily.  . rosuvastatin (CRESTOR) 5 MG tablet Take 1 tablet three times week and increase as tolerated  . Tamsulosin HCl (FLOMAX) 0.4 MG CAPS Take 0.4 mg by mouth every evening.   . thiamine 100 MG tablet Take 100 mg by mouth daily.  . vitamin B-12 (CYANOCOBALAMIN) 1000 MCG tablet Take 1,000 mcg by mouth daily.  . [DISCONTINUED] doxycycline (VIBRA-TABS) 100 MG tablet Take 1 tablet (100 mg total) by mouth 2 (two) times daily. (Patient not taking: Reported on 05/17/2018)   No  facility-administered encounter medications on file as of 05/17/2018.     Activities of Daily Living In your present state of health, do you have any difficulty performing the following activities: 05/17/2018  Hearing? N  Vision? N  Difficulty concentrating or making decisions? Y  Walking or climbing stairs? N  Dressing or bathing? N  Doing errands, shopping? Y  Preparing Food and eating ? Y  Using the Toilet? N  In the past six months, have you accidently leaked urine? N  Do you have problems with loss of bowel control? N  Managing your Medications? Y  Managing your Finances? Y  Housekeeping or managing your Housekeeping? Y  Some recent data might be hidden    Patient Care Team: [name], MD as PCP - General (Internal Medicine) Dorothy Spark, MD as PCP - Cardiology (Cardiology) Constance Haw, MD as PCP - Electrophysiology (Cardiology) Conner, Carney Harder (Neurology) Inocente Salles Aline August, MD as Referring Physician (Neurology) Burman Foster, MD as Physician Assistant (Urology) Florina Ou, CCC-SLP as Speech Language Pathologist (Speech Pathology)   Assessment:   This is a routine wellness examination for [place]. Physical assessment deferred to PCP.   Exercise Activities and Dietary recommendations Current Exercise Habits: Home exercise routine;The patient does not participate in regular exercise at present(chair exercise print-outs provided), Exercise limited by: orthopedic condition(s) Diet (meal preparation, eat out, water intake, caffeinated beverages, dairy products, fruits and vegetables): in general, a "healthy" diet  , well balanced     Goals    . Be as active and as independent as possible     Get better organized by continuing to use my index cards, increased activity by walking and washing my car, monitor diet closer.    . Patient Stated  Start to exercise by doing chair exercises several times a week.       Fall Risk Fall Risk  05/17/2018  04/28/2017 04/20/2017 05/10/2016 03/16/2016  Falls in the past year? 0 No No No No  Comment - - - - -  Risk for fall due to : Impaired balance/gait;Impaired mobility Impaired balance/gait - - -  Follow up Falls prevention discussed - - - -    Depression Screen PHQ 2/9 Scores 05/17/2018 04/28/2017 04/20/2017 03/16/2016  PHQ - 2 Score 3 3 1  0  PHQ- 9 Score 6 6 - -    Cognitive Function MMSE - Mini Mental State Exam 05/17/2018 04/28/2017  Orientation to time 5 4  Orientation to Place 5 4  Registration 3 3  Attention/ Calculation 3 4  Recall 2 1  Language- name 2 objects 2 2  Language- repeat 1 1  Language- follow 3 step command 3 2  Language- read & follow direction 1 1  Write a sentence 1 1  Copy design 1 1  Total score 27 24        Immunization History  Administered Date(s) Administered  . Influenza Split 04/10/2012  . Influenza Whole 05/05/2011, 04/10/2012  . Influenza, High Dose Seasonal PF 04/04/2013, 03/22/2014, 05/01/2015, 03/16/2016, 04/20/2017, 05/17/2018  . Pneumococcal Conjugate-13 08/03/2009, 08/04/2011  . Pneumococcal Polysaccharide-23 07/25/2016  . Tdap 07/25/2011  . Zoster 06/06/2010  . Zoster Recombinat (Shingrix) 05/15/2017, 07/21/2017   Screening Tests Health Maintenance  Topic Date Due  . COLONOSCOPY  04/10/2018  . INFLUENZA VACCINE  11/01/2018 (Originally 02/01/2018)  . DEXA SCAN  10/22/2018  . TETANUS/TDAP  07/24/2021  . Hepatitis C Screening  Completed  . PNA vac Low Risk Adult  Completed      Plan:   Continue doing brain stimulating activities (puzzles, reading, adult coloring books, staying active) to keep memory sharp.   Continue to eat heart healthy diet (full of fruits, vegetables, whole grains, lean protein, water--limit salt, fat, and sugar intake) and increase physical activity as tolerated.  I have personally reviewed and noted the following in the patient's chart:   . Medical and social history . Use of alcohol, tobacco or illicit  drugs  . Current medications and supplements . Functional ability and status . Nutritional status . Physical activity . Advanced directives . List of other physicians . Vitals . Screenings to include cognitive, depression, and falls . Referrals and appointments  In addition, I have reviewed and discussed with patient certain preventive protocols, quality metrics, and best practice recommendations. A written personalized care plan for preventive services as well as general preventive health recommendations were provided to patient.     Michiel Cowboy, RN  05/17/2018   Medical screening examination/treatment/procedure(s) were performed by non-physician practitioner and as supervising physician I was immediately available for consultation/collaboration. I agree with above. [name], MD

## 2018-05-21 ENCOUNTER — Other Ambulatory Visit: Payer: Self-pay

## 2018-05-21 ENCOUNTER — Ambulatory Visit: Payer: Medicare Other | Admitting: Cardiology

## 2018-05-21 NOTE — Telephone Encounter (Signed)
Can you call this patient to schedule please? Thank you!

## 2018-05-21 NOTE — Telephone Encounter (Signed)
Patient already scheduled

## 2018-05-22 DIAGNOSIS — R4689 Other symptoms and signs involving appearance and behavior: Secondary | ICD-10-CM | POA: Diagnosis not present

## 2018-05-22 DIAGNOSIS — R5383 Other fatigue: Secondary | ICD-10-CM | POA: Diagnosis not present

## 2018-05-22 DIAGNOSIS — Z982 Presence of cerebrospinal fluid drainage device: Secondary | ICD-10-CM | POA: Diagnosis not present

## 2018-05-22 DIAGNOSIS — Z4541 Encounter for adjustment and management of cerebrospinal fluid drainage device: Secondary | ICD-10-CM | POA: Diagnosis not present

## 2018-05-23 ENCOUNTER — Ambulatory Visit: Payer: Medicare Other | Admitting: Cardiology

## 2018-05-24 ENCOUNTER — Telehealth: Payer: Self-pay | Admitting: *Deleted

## 2018-05-24 NOTE — Telephone Encounter (Signed)
Spoke with wife (per DPR) and advised that patient should have cologuard at this time. Only needs colonoscopy if cologuard is positive. Advised orders will be placed for Cologuard to be sent to the home today. We will let patient know results once they have been received. She verbalizes understanding.

## 2018-05-24 NOTE — Telephone Encounter (Signed)
-----   Message from Jerene Bears, MD sent at 05/24/2018 11:40 AM EST ----- Pt asked about her husband for CRC screening Former brodie patient Last colon 5 yrs ago With other medical problems, would rec cologuard and then colonoscopy if + JMP

## 2018-05-28 ENCOUNTER — Ambulatory Visit (INDEPENDENT_AMBULATORY_CARE_PROVIDER_SITE_OTHER): Payer: Medicare Other | Admitting: Cardiology

## 2018-05-28 ENCOUNTER — Encounter: Payer: Self-pay | Admitting: Cardiology

## 2018-05-28 VITALS — BP 126/74 | HR 58 | Ht 68.0 in | Wt 265.0 lb

## 2018-05-28 DIAGNOSIS — R001 Bradycardia, unspecified: Secondary | ICD-10-CM | POA: Diagnosis not present

## 2018-05-28 DIAGNOSIS — I5032 Chronic diastolic (congestive) heart failure: Secondary | ICD-10-CM

## 2018-05-28 DIAGNOSIS — I2510 Atherosclerotic heart disease of native coronary artery without angina pectoris: Secondary | ICD-10-CM | POA: Diagnosis not present

## 2018-05-28 DIAGNOSIS — I10 Essential (primary) hypertension: Secondary | ICD-10-CM

## 2018-05-28 NOTE — Progress Notes (Signed)
Electrophysiology Office Note   Date:  05/28/2018   ID:  Jeff, Weaver Mar 15, 1946, MRN [id number]  PCP:  [name], MD  Cardiologist:  Jeff Weaver Primary Electrophysiologist:  Jeff Blinn Meredith Leeds, MD    No chief complaint on file.    History of Present Illness: Jeff Weaver is a 72 y.o. male who is being seen today for the evaluation of bradycardia at the request of Jeff Weaver. Presenting today for electrophysiology evaluation.  He has a history of obesity, orthostasis, hyperlipidemia, chronic diastolic heart failure/hypertensive heart disease, prediabetes, cerebral aneurysm status post clipping, seizure disorder status post VP shunt, sleep apnea, mild AI.  His main complaint is dyspnea with exertion.  He is to be able to walk 3 miles daily but is not able to do this amount of exercise.  Walking partner is not in good health and thus his exercise is not been consistent.  He has had bradycardia in the past with his lowest at home reaching 29 bpm recorded by his wife.  He does have some shortness of breath when he exerts himself such as climbing stairs, but feels well otherwise.  He also gets some mild chest discomfort in the center of his chest when he is sitting down.  He had a heart catheterization that showed minimal coronary disease.  Today, denies symptoms of palpitations, chest pain, orthopnea, PND, lower extremity edema, claudication, dizziness, presyncope, syncope, bleeding, or neurologic sequela. The patient is tolerating medications without difficulties.  He continues to have weakness and fatigue.  This has now mainly when he is trying to exert himself.  He feels well when he is at rest.  He also gets short of breath when exertion.  Past Medical History:  Diagnosis Date  . Aortic insufficiency    a. mild 2017 echo.  . Cerebral aneurysm    a. s/p clipping at [facility] in 8/13 c/b short-term memory loss, cerebral hemorrhage and seizure disorder s/p VP shunt.  . Cerebral  hemorrhage ([place])   . Chronic diastolic CHF (congestive heart failure) ([place])   . CVA (cerebral infarction)   . Diverticulosis   . Enteritis (regional)    Jeff Weaver  . First degree AV block   . GI bleed   . HTN (hypertension)   . Hyperlipidemia   . Hypertensive heart disease   . IBS (irritable bowel syndrome)   . Mild CAD    Cardiac cath 01/12/18 showed minimal nonobstructive CAD, normal LVEF, normal LVEDP.  . Orthostatic hypotension   . Pre-diabetes   . Regional enteritis of large intestine ([place]) since 1978  . Rheumatoid arthritis(714.0)    dxed in [employer] in 1980s  . Second degree AV block, Mobitz type I   . Seizures ([place])   . Sinus bradycardia   . Sleep apnea    Past Surgical History:  Procedure Laterality Date  . CHOLECYSTECTOMY N/A 01/07/2016   Procedure: LAPAROSCOPIC CHOLECYSTECTOMY ;  Surgeon: Coralie Keens, MD;  Location: [facility];  Service: General;  Laterality: N/A;  . cns shunt  02/23/12  . COLONOSCOPY W/ POLYPECTOMY  1978   negative 2009, Jeff Weaver. Due 2014  . CRANIOTOMY  02/02/12   Jeff Weaver, [place] of aneurysm  . 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 HERNIA REPAIR    . LEFT HEART CATH AND CORONARY ANGIOGRAPHY N/A 01/12/2018   Procedure: LEFT HEART CATH AND CORONARY ANGIOGRAPHY;  Surgeon: Jeff, Peter M, MD;  Location: [facility];  Service: Cardiovascular;  Laterality: N/A;  . LEFT HEART CATHETERIZATION WITH CORONARY ANGIOGRAM N/A 11/26/2013   Procedure: LEFT HEART CATHETERIZATION WITH CORONARY ANGIOGRAM;  Surgeon: Jeff Man, MD;  Location: [facility] CATH LAB;  Service: Cardiovascular;  Laterality: N/A;  . NOSE SURGERY    . SHOULDER SURGERY  1997  . TONSILLECTOMY AND ADENOIDECTOMY    . VENTRICULOPERITONEAL SHUNT  02-23-12   INSERTION OF RIGHT FRONTAL VENTRICULOPERITONEAL SHUNT WITH A CODMAN HAKIM PROGRAMMABLE VALVE     Current Outpatient Medications  Medication Sig Dispense Refill   . acetaminophen (TYLENOL) 325 MG tablet Take 325-650 mg by mouth every 6 (six) hours as needed for headache.    Marland Kitchen amoxicillin (AMOXIL) 500 MG capsule Take 2,000 mg by mouth as directed. Take 2000 mg by mouth 1 hour prior to dental appointments    . aspirin EC 81 MG tablet Take 1 tablet (81 mg total) by mouth daily. 90 tablet 3  . blood glucose meter kit and supplies KIT Dispense based on patient and insurance preference. Use up to four times daily as directed. (prediabetes hypoglycemia). 1 each 0  . cetirizine (ZYRTEC) 10 MG tablet Take 1 tablet (10 mg total) by mouth daily. 30 tablet 11  . cholecalciferol (VITAMIN D) 1000 units tablet Take 1,000 Units by mouth daily.    . Lacosamide 100 MG TABS Take 200 mg by mouth 2 (two) times daily.     Marland Kitchen levETIRAcetam (KEPPRA) 500 MG tablet Take 1,000 mg by mouth 2 (two) times daily.     Marland Kitchen LORazepam (ATIVAN) 1 MG tablet Take 1 mg by mouth every 6 (six) hours as needed for seizure.     Marland Kitchen losartan (COZAAR) 25 MG tablet TAKE 1 TABLET TWICE A DAY 180 tablet 3  . mirabegron ER (MYRBETRIQ) 25 MG TB24 tablet Take 25 mg by mouth every evening.     . nitroGLYCERIN (NITROSTAT) 0.4 MG SL tablet Place 1 tablet (0.4 mg total) under the tongue every 5 (five) minutes as needed for chest pain. 25 tablet 3  . NON FORMULARY CPAP Machine: authorized by [place] in [place]    . pyridOXINE (VITAMIN B-6) 100 MG tablet Take 100 mg by mouth daily.    . rosuvastatin (CRESTOR) 5 MG tablet Take 1 tablet three times week and increase as tolerated 45 tablet 2  . Tamsulosin HCl (FLOMAX) 0.4 MG CAPS Take 0.4 mg by mouth every evening.     . thiamine 100 MG tablet Take 100 mg by mouth daily.    . vitamin B-12 (CYANOCOBALAMIN) 1000 MCG tablet Take 1,000 mcg by mouth daily.     No current facility-administered medications for this visit.     Allergies:   Haloperidol; Rosuvastatin; Simvastatin; Lisinopril; Nifedipine; and Pravastatin   Social History:  The patient  reports that he quit smoking  about 9 years ago. His smoking use included pipe. He smoked 0.00 packs per day for 30.00 years. He has quit using smokeless tobacco. He reports that he drinks alcohol. He reports that he does not use drugs.   Family History:  The patient's family history includes COPD in his father; Coronary artery disease in his father; Dementia (age of onset: 48) in his sister; Diabetes in his brother and sister; Hepatitis in his mother.    ROS:  Please see the history of present illness.   Otherwise, review of systems is positive for none.   All other systems are reviewed and negative.   PHYSICAL EXAM:  VS:  BP 126/74   Pulse (!) 58   Ht 5' 8"  (1.727 Weaver)   Wt 265 lb (120.2 kg)   BMI 40.29 kg/Weaver  , BMI Body mass index is 40.29 kg/Weaver. GEN: Well nourished, well developed, in no acute distress  HEENT: normal  Neck: no JVD, carotid bruits, or masses Cardiac: RRR; no murmurs, rubs, or gallops,no edema  Respiratory:  clear to auscultation bilaterally, normal work of breathing GI: soft, nontender, nondistended, + BS MS: no deformity or atrophy  Skin: warm and dry Neuro:  Strength and sensation are intact Psych: euthymic mood, full affect  EKG:  EKG is ordered today. Personal review of the ekg ordered shows junctional rhythm, rate 58   Recent Labs: 02/01/2018: TSH 1.390 04/18/2018: ALT 32; BUN 13; Creatinine, Ser 1.02; Hemoglobin 15.1; Platelets 169.0; Potassium 4.8; Sodium 136    Lipid Panel     Component Value Date/Time   CHOL 131 04/18/2018 1023   CHOL 128 09/08/2017 1202   CHOL 159 03/27/2013 0818   TRIG 131.0 04/18/2018 1023   TRIG 127 03/27/2013 0818   HDL 40.70 04/18/2018 1023   HDL 47 09/08/2017 1202   HDL 45 03/27/2013 0818   CHOLHDL 3 04/18/2018 1023   VLDL 26.2 04/18/2018 1023   LDLCALC 64 04/18/2018 1023   LDLCALC 56 09/08/2017 1202   LDLCALC 89 03/27/2013 0818   LDLDIRECT 87.6 09/19/2012 1206     Wt Readings from Last 3 Encounters:  05/28/18 265 lb (120.2 kg)  05/17/18 268 lb  (121.6 kg)  04/18/18 267 lb (121.1 kg)      Other studies Reviewed: Additional studies/ records that were reviewed today include: LHC 01/12/18  Review of the above records today demonstrates:   The left ventricular systolic function is normal.  LV end diastolic pressure is normal.  The left ventricular ejection fraction is 55-65% by visual estimate.    1. Minimal nonobstructive CAD 2. Normal LV function 3. Normal LVEDP  Holter 02/12/18 - personally reviewed  Profound sinus bradycardia with HR down to 30' even during awake hours. Minimum HR 28 BPM.  1. AVB, frequent non-conducted P waves.  Very frequent PVCs - 7 % of total beats and very short runs of nsVT.  TTE 2017 - Left ventricle: The cavity size was normal. There was moderate   concentric hypertrophy. Systolic function was normal. The   estimated ejection fraction was in the range of 60% to 65%. Wall   motion was normal; there were no regional wall motion   abnormalities. Features are consistent with a pseudonormal left   ventricular filling pattern, with concomitant abnormal relaxation   and increased filling pressure (grade 2 diastolic dysfunction). - Aortic valve: There was mild regurgitation.  ASSESSMENT AND PLAN:  1.  Junctional bradycardia: Is currently in junctional rhythm today.  He does complain of symptoms of weakness and fatigue as well as shortness of breath with exertion.  I am concerned that he cannot get his heart rate up to a reasonable level.  Due to that, I discussed with him the possibility of pacemaker insertion.  Risks and benefits were discussed include bleeding, tamponade, infection, pneumothorax.  The patient understands the risks and is agreed to the procedure.  Patient does have some sort of VP shunt that runs down the left side of his chest.  I Chrystina Naff discuss this with his neurosurgeon.  2.  Chronic diastolic heart failure: No signs of overt volume overload.  No changes.  3.  Hypertension:  Well-controlled  today.  Current medicines are reviewed at length with the patient today.   The patient does not have concerns regarding his medicines.  The following changes were made today: None  Labs/ tests ordered today include:  Orders Placed This Encounter  Procedures  . EKG 12-Lead    Disposition:   FU with Cali Hope 3 months  Signed, Carmelina Balducci Meredith Leeds, MD  05/28/2018 3:20 PM     [facility] [address] [place] [address] [phone] (office) [phone] (fax)

## 2018-05-28 NOTE — Patient Instructions (Signed)
Medication Instructions:  Your physician recommends that you continue on your current medications as directed. Please refer to the Current Medication list given to you today.  * If you need a refill on your cardiac medications before your next appointment, please call your pharmacy.   Labwork: None ordered *We will only notify you of abnormal results, otherwise continue current treatment plan.  Testing/Procedures: Your physician has recommended that you have a pacemaker inserted. A pacemaker is a small device that is placed under the skin of your chest or abdomen to help control abnormal heart rhythms. This device uses electrical pulses to prompt the heart to beat at a normal rate. Pacemakers are used to treat heart rhythms that are too slow. Wire (leads) are attached to the pacemaker that goes into the chambers of you heart. This is done in the hospital and usually requires and overnight stay.   The following dates are available (these dates are subject to change):  12/2, 12/11,  12/12, 12/30, 1/8, 1/15, 1/22, 1/23, 1/29, 1/30   Follow-Up: To be determined once procedure is scheduled.  *Please note that any paperwork needing to be filled out by the provider will need to be addressed at the front desk prior to seeing the provider. Please note that any FMLA, disability or other documents regarding health condition is subject to a $25.00 charge that must be received prior to completion of paperwork in the form of a money order or check.  Thank you for choosing [facility]!!   Trinidad Curet, RN [phone]  Any Other Special Instructions Will Be Listed Below (If Applicable).    Pacemaker Implantation, Adult Pacemaker implantation is a procedure to place a pacemaker inside your chest. A pacemaker is a small computer that sends electrical signals to the heart and helps your heart beat normally. A pacemaker also stores information about your heart rhythms. You may need pacemaker  implantation if you:  Have a slow heartbeat (bradycardia).  Faint (syncope).  Have shortness of breath (dyspnea) due to heart problems.  The pacemaker attaches to your heart through a wire, called a lead. Sometimes just one lead is needed. Other times, there will be two leads. There are two types of pacemakers:  Transvenous pacemaker. This type is placed under the skin or muscle of your chest. The lead goes through a vein in the chest area to reach the inside of the heart.  Epicardial pacemaker. This type is placed under the skin or muscle of your chest or belly. The lead goes through your chest to the outside of the heart.  Tell a health care provider about:  Any allergies you have.  All medicines you are taking, including vitamins, herbs, eye drops, creams, and over-the-counter medicines.  Any problems you or family members have had with anesthetic medicines.  Any blood or bone disorders you have.  Any surgeries you have had.  Any medical conditions you have.  Whether you are pregnant or may be pregnant. What are the risks? Generally, this is a safe procedure. However, problems may occur, including:  Infection.  Bleeding.  Failure of the pacemaker or the lead.  Collapse of a lung or bleeding into a lung.  Blood clot inside a blood vessel with a lead.  Damage to the heart.  Infection inside the heart (endocarditis).  Allergic reactions to medicines.  What happens before the procedure? Staying hydrated Follow instructions from your health care provider about hydration, which may include:  Up to 2 hours before the procedure -  you may continue to drink clear liquids, such as water, clear fruit juice, black coffee, and plain tea.  Eating and drinking restrictions Follow instructions from your health care provider about eating and drinking, which may include:  8 hours before the procedure - stop eating heavy meals or foods such as meat, fried foods, or fatty  foods.  6 hours before the procedure - stop eating light meals or foods, such as toast or cereal.  6 hours before the procedure - stop drinking milk or drinks that contain milk.  2 hours before the procedure - stop drinking clear liquids.  Medicines  Ask your health care provider about: ? Changing or stopping your regular medicines. This is especially important if you are taking diabetes medicines or blood thinners. ? Taking medicines such as aspirin and ibuprofen. These medicines can thin your blood. Do not take these medicines before your procedure if your health care provider instructs you not to.  You may be given antibiotic medicine to help prevent infection. General instructions  You will have a heart evaluation. This may include an electrocardiogram (ECG), chest X-ray, and heart imaging (echocardiogram,  or echo) tests.  You will have blood tests.  Do not use any products that contain nicotine or tobacco, such as cigarettes and e-cigarettes. If you need help quitting, ask your health care provider.  Plan to have someone take you home from the hospital or clinic.  If you will be going home right after the procedure, plan to have someone with you for 24 hours.  Ask your health care provider how your surgical site will be marked or identified. What happens during the procedure?  To reduce your risk of infection: ? Your health care team will wash or sanitize their hands. ? Your skin will be washed with soap. ? Hair may be removed from the surgical area.  An IV tube will be inserted into one of your veins.  You will be given one or more of the following: ? A medicine to help you relax (sedative). ? A medicine to numb the area (local anesthetic). ? A medicine to make you fall asleep (general anesthetic).  If you are getting a transvenous pacemaker: ? An incision will be made in your upper chest. ? A pocket will be made for the pacemaker. It may be placed under the skin or  between layers of muscle. ? The lead will be inserted into a blood vessel that returns to the heart. ? While X-rays are taken by an imaging machine (fluoroscopy), the lead will be advanced through the vein to the inside of your heart. ? The other end of the lead will be tunneled under the skin and attached to the pacemaker.  If you are getting an epicardial pacemaker: ? An incision will be made near your ribs or breastbone (sternum) for the lead. ? The lead will be attached to the outside of your heart. ? Another incision will be made in your chest or upper belly to create a pocket for the pacemaker. ? The free end of the lead will be tunneled under the skin and attached to the pacemaker.  The transvenous or epicardial pacemaker will be tested. Imaging studies may be done to check the lead position.  The incisions will be closed with stitches (sutures), adhesive strips, or skin glue.  Bandages (dressing) will be placed over the incisions. The procedure may vary among health care providers and hospitals. What happens after the procedure?  Your blood pressure, heart  rate, breathing rate, and blood oxygen level will be monitored until the medicines you were given have worn off.  You will be given antibiotics and pain medicine.  ECG and chest x-rays will be done.  You will wear a continuous type of ECG (Holter monitor) to check your heart rhythm.  Your health care provider willprogram the pacemaker.  Do not drive for 24 hours if you received a sedative. This information is not intended to replace advice given to you by your health care provider. Make sure you discuss any questions you have with your health care provider. Document Released: 06/10/2002 Document Revised: 01/08/2016 Document Reviewed: 12/02/2015 Elsevier Interactive Patient Education  2018 [employer].    Medication Instructions:  Your physician recommends that you continue on your current medications as directed.  Please refer to the Current Medication list given to you today.     * If you need a refill on your cardiac medications before your next appointment, please call your pharmacy. *   Labwork: Pre procedure lab work to be scheduled once procedure is scheduled * Will notify you of abnormal results, otherwise continue current treatment plan.*  Testing/Procedures: Your physician has recommended that you have a defibrillator inserted. An implantable cardioverter defibrillator (ICD) is a small device that is placed in your chest or, in rare cases, your abdomen. This device uses electrical pulses or shocks to help control life-threatening, irregular heartbeats that could lead the heart to suddenly stop beating (sudden cardiac arrest). Leads are attached to the ICD that goes into your heart. This is done in the hospital and usually requires an overnight stay. Please follow the instructions below, located under the special instructions section.   Follow-Up: Your physician recommends that you schedule a wound check appointment 10-14 days, after your procedure on _______, with the device clinic.  Your physician recommends that you schedule a follow up appointment in 91 days, after your procedure on _______, with Dr. Curt Bears.  * Please note that any paperwork needing to be filled out by the provider will need to be addressed at the front desk prior to seeing the provider.  Please note that any FMLA, disability or other documents regarding health condition is subject to a $25.00 charge that must be received prior to completion of paperwork in the form of a money order or check. *  Thank you for choosing [facility]!!   Trinidad Curet, RN [phone]   Any Other Special Instructions Will Be Listed Below (If Applicable).     Implantable Device Instructions  You are scheduled for:                  _____ Implantable Cardioverter Defibrillator  on  ________  with Dr. Curt Bears.  1.   Please  arrive at the [facility], Entrance "A"  at [facility] at  _______ on the day of your procedure. (The address is [address])  2. Do not eat or drink after midnight the night before your procedure.  3.   Complete pre procedure  lab work on ______.  You do not have to be fasting.  4.  Medication instructions:   5.  Plan for an overnight stay.  Bring your insurance cards and a list of you medications.  6.  Wash your chest and neck with surgical scrub the evening before and the morning of  your procedure.  Rinse well. Please review the surgical scrub instruction sheet given to you.  7. Your chest  will need to be shaved prior to this procedure (if needed). We ask that you do this yourself at home 1 to 2 days before or if uncomfortable/unable to do yourself, then it will be performed by the hospital staff the day of.                                                                                                                * If you have ANY questions after you get home, please call Trinidad Curet, RN @ [phone].  * Every attempt is made to prevent procedures from being rescheduled.  Due to the nature of  Electrophysiology, rescheduling can happen.  The physician is always aware and directs the staff when this occurs.   Supplemental Discharge Instructions for  Pacemaker/Defibrillator Patients  ACTIVITY No heavy lifting or vigorous activity with your left/right arm for 6 to 8 weeks.  Do not raise your left/right arm above your head for one week.  Gradually raise your affected arm as drawn below.           __  NO DRIVING for     ; you may begin driving on     .  WOUND CARE - Keep the wound area clean and dry.  Do not get this area wet for one week. No showers for one week; you may shower on     . - The tape/steri-strips on your wound will fall off; do not pull them off.  No bandage is needed on the site.  DO  NOT apply any creams, oils, or ointments to the wound  area. - If you notice any drainage or discharge from the wound, any swelling or bruising at the site, or you develop a fever > 101? F after you are discharged home, call the office at once.  SPECIAL INSTRUCTIONS - You are still able to use cellular telephones; use the ear opposite the side where you have your pacemaker/defibrillator.  Avoid carrying your cellular phone near your device. - When traveling through airports, show security personnel your identification card to avoid being screened in the metal detectors.  Ask the security personnel to use the hand wand. - Avoid arc welding equipment, MRI testing (magnetic resonance imaging), TENS units (transcutaneous nerve stimulators).  Call the office for questions about other devices. - Avoid electrical appliances that are in poor condition or are not properly grounded. - Microwave ovens are safe to be near or to operate.  ADDITIONAL INFORMATION FOR DEFIBRILLATOR PATIENTS SHOULD YOUR DEVICE GO OFF: - If your device goes off ONCE and you feel fine afterward, notify the device clinic nurses. - If your device goes off ONCE and you do not feel well afterward, call 911. - If your device goes off TWICE, call 911. - If your device goes off [place], call 911.  DO NOT DRIVE YOURSELF OR A FAMILY MEMBER WITH A DEFIBRILLATOR TO THE HOSPITAL-CALL 911.

## 2018-06-07 ENCOUNTER — Telehealth: Payer: Self-pay | Admitting: *Deleted

## 2018-06-07 ENCOUNTER — Encounter: Payer: Self-pay | Admitting: Cardiology

## 2018-06-07 ENCOUNTER — Ambulatory Visit (INDEPENDENT_AMBULATORY_CARE_PROVIDER_SITE_OTHER): Payer: Medicare Other | Admitting: Cardiology

## 2018-06-07 VITALS — BP 145/85 | HR 59 | Ht 68.0 in | Wt 270.1 lb

## 2018-06-07 DIAGNOSIS — I5033 Acute on chronic diastolic (congestive) heart failure: Secondary | ICD-10-CM

## 2018-06-07 DIAGNOSIS — I25118 Atherosclerotic heart disease of native coronary artery with other forms of angina pectoris: Secondary | ICD-10-CM | POA: Diagnosis not present

## 2018-06-07 DIAGNOSIS — I5032 Chronic diastolic (congestive) heart failure: Secondary | ICD-10-CM

## 2018-06-07 DIAGNOSIS — I2510 Atherosclerotic heart disease of native coronary artery without angina pectoris: Secondary | ICD-10-CM | POA: Diagnosis not present

## 2018-06-07 DIAGNOSIS — R001 Bradycardia, unspecified: Secondary | ICD-10-CM | POA: Diagnosis not present

## 2018-06-07 LAB — BASIC METABOLIC PANEL
BUN/Creatinine Ratio: 11 (ref 10–24)
BUN: 13 mg/dL (ref 8–27)
CO2: 24 mmol/L (ref 20–29)
Calcium: 9.6 mg/dL (ref 8.6–10.2)
Chloride: 101 mmol/L (ref 96–106)
Creatinine, Ser: 1.15 mg/dL (ref 0.76–1.27)
GFR calc Af Amer: 72 mL/min/{1.73_m2} (ref >59)
GFR calc non Af Amer: 62 mL/min/{1.73_m2} (ref >59)
Glucose: 109 mg/dL — ABNORMAL HIGH (ref 65–99)
Potassium: 4.3 mmol/L (ref 3.5–5.2)
Sodium: 140 mmol/L (ref 134–144)

## 2018-06-07 LAB — PRO B NATRIURETIC PEPTIDE: NT-Pro BNP: 480 pg/mL — ABNORMAL HIGH (ref 0–376)

## 2018-06-07 MED ORDER — FUROSEMIDE 40 MG PO TABS: 40.0000 mg | ORAL_TABLET | Freq: Every day | ORAL | 1 refills | Status: DC

## 2018-06-07 MED FILL — AMOXICILLIN 500 MG CAPSULE: 500 | 2 days supply | Qty: 8 | Fill #0

## 2018-06-07 MED FILL — FUROSEMIDE 40 MG TAB: 40 | 90 days supply | Qty: 90 | Fill #0

## 2018-06-07 NOTE — Progress Notes (Signed)
Cardiology Office Note    Date:  06/07/2018  ID:  Jeff Weaver, DOB Nov 18, 1945, MRN [id number] PCP:  [name], MD  Cardiologist:  [name]   Chief Complaint: Fatigue, lower extremity edema  History of Present Illness:  Jeff Weaver is a 72 y.o. male with history of obesity, HTN, HLD, chronic diastolic CHF/hypertensive heart disease, prediabetes, cerebral aneurysm s/p clipping at [facility] in 8/13 c/b short-term memory loss, cerebral hemorrhage and seizure disorder s/p VP shunt, sleep apnea, mild AI, sinus bradycardia (baseline HR 50s, 1st degree AV block). He underwent coronary CTA in January of this year that showed a calcium score of 271, moderate coronary artery disease in mid LAD and severe CAD in OM1 branch, CT FFR showed 0.8 ,  However cardiac catheterization showed only mild nonobstructive CAD.   He is minimally active and not eating healthy diet.  He has been compliant with his meds and tolerating them well. He uses CPAP machine at night.   He was seen by Dr. Curt Bears on May 28, 2018 for concern of bradycardia.  He is very symptomatic with worsening dyspnea on exertion and profound fatigue.  He has had bradycardia down to 29 bpm that was recorded by his wife.  On exertion he is usually in 22s.  His EKG showed junctional bradycardia.  Dr. Curt Bears recommended pacemaker placement however patient wanted to decide about the proper date.  Today he states that he has been fluctuating weight, he has worsening lower extremity edema and significant shortness of breath on exertion.  He is on and off paroxysmal nocturnal dyspnea, no chest pain no syncope.  No bleeding with Eliquis.  Past Medical History:  Diagnosis Date  . Aortic insufficiency    a. mild 2017 echo.  . Cerebral aneurysm    a. s/p clipping at [facility] in 8/13 c/b short-term memory loss, cerebral hemorrhage and seizure disorder s/p VP shunt.  . Cerebral hemorrhage ([place])   . Chronic diastolic CHF (congestive heart failure)  ([place])   . CVA (cerebral infarction)   . Diverticulosis   . Enteritis (regional)    Dr Olevia Perches  . First degree AV block   . GI bleed   . HTN (hypertension)   . Hyperlipidemia   . Hypertensive heart disease   . IBS (irritable bowel syndrome)   . Mild CAD    Cardiac cath 01/12/18 showed minimal nonobstructive CAD, normal LVEF, normal LVEDP.  . Orthostatic hypotension   . Pre-diabetes   . Regional enteritis of large intestine ([place]) since 1978  . Rheumatoid arthritis(714.0)    dxed in [employer] in 1980s  . Second degree AV block, Mobitz type I   . Seizures ([place])   . Sinus bradycardia   . Sleep apnea     Past Surgical History:  Procedure Laterality Date  . CHOLECYSTECTOMY N/A 01/07/2016   Procedure: LAPAROSCOPIC CHOLECYSTECTOMY ;  Surgeon: Coralie Keens, MD;  Location: [place];  Service: General;  Laterality: N/A;  . cns shunt  02/23/12  . COLONOSCOPY W/ POLYPECTOMY  1978   negative 2009, Dr Olevia Perches. Due 2014  . CRANIOTOMY  02/02/12   Dr Harvel Ricks, [place] of aneurysm  . 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 HERNIA REPAIR    . LEFT HEART CATH AND CORONARY ANGIOGRAPHY N/A 01/12/2018   Procedure: LEFT HEART CATH AND CORONARY ANGIOGRAPHY;  Surgeon: Martinique, Peter M, MD;  Location: [facility];  Service: Cardiovascular;  Laterality: N/A;  . LEFT HEART CATHETERIZATION WITH CORONARY ANGIOGRAM N/A 11/26/2013   Procedure: LEFT HEART CATHETERIZATION WITH CORONARY ANGIOGRAM;  Surgeon: [name], MD;  Location: [facility] CATH LAB;  Service: Cardiovascular;  Laterality: N/A;  . NOSE SURGERY    . SHOULDER SURGERY  1997  . TONSILLECTOMY AND ADENOIDECTOMY    . VENTRICULOPERITONEAL SHUNT  02-23-12   INSERTION OF RIGHT FRONTAL VENTRICULOPERITONEAL SHUNT WITH A CODMAN HAKIM PROGRAMMABLE VALVE    Current Medications: Current Outpatient Medications  Medication Sig Dispense Refill  . acetaminophen (TYLENOL) 325 MG tablet Take  325-650 mg by mouth every 6 (six) hours as needed for headache.    Marland Kitchen amoxicillin (AMOXIL) 500 MG capsule Take 2,000 mg by mouth as directed. Take 2000 mg by mouth 1 hour prior to dental appointments    . aspirin EC 81 MG tablet Take 1 tablet (81 mg total) by mouth daily. 90 tablet 3  . blood glucose meter kit and supplies KIT Dispense based on patient and insurance preference. Use up to four times daily as directed. (prediabetes hypoglycemia). 1 each 0  . cetirizine (ZYRTEC) 10 MG tablet Take 1 tablet (10 mg total) by mouth daily. 30 tablet 11  . cholecalciferol (VITAMIN D) 1000 units tablet Take 1,000 Units by mouth daily.    . Lacosamide 100 MG TABS Take 200 mg by mouth 2 (two) times daily.     Marland Kitchen levETIRAcetam (KEPPRA) 500 MG tablet Take 1,000 mg by mouth 2 (two) times daily.     Marland Kitchen LORazepam (ATIVAN) 1 MG tablet Take 1 mg by mouth every 6 (six) hours as needed for seizure.     Marland Kitchen losartan (COZAAR) 25 MG tablet TAKE 1 TABLET TWICE A DAY 180 tablet 3  . mirabegron ER (MYRBETRIQ) 25 MG TB24 tablet Take 25 mg by mouth every evening.     . nitroGLYCERIN (NITROSTAT) 0.4 MG SL tablet Place 1 tablet (0.4 mg total) under the tongue every 5 (five) minutes as needed for chest pain. 25 tablet 3  . NON FORMULARY CPAP Machine: authorized by [place] in [place]    . pyridOXINE (VITAMIN B-6) 100 MG tablet Take 100 mg by mouth daily.    . rosuvastatin (CRESTOR) 5 MG tablet Take 1 tablet three times week and increase as tolerated 45 tablet 2  . Tamsulosin HCl (FLOMAX) 0.4 MG CAPS Take 0.4 mg by mouth every evening.     . thiamine 100 MG tablet Take 100 mg by mouth daily.    . vitamin B-12 (CYANOCOBALAMIN) 1000 MCG tablet Take 1,000 mcg by mouth daily.    . furosemide (LASIX) 40 MG tablet Take 1 tablet (40 mg total) by mouth daily. 90 tablet 1   No current facility-administered medications for this visit.      Allergies:   Haloperidol; Rosuvastatin; Simvastatin; Lisinopril; Nifedipine; and Pravastatin   Social  History   Socioeconomic History  . Marital status: Married    Spouse name: Not on file  . Number of children: 1  . Years of education: Not on file  . Highest education level: Not on file  Occupational History  . Occupation: retired  Scientific laboratory technician  . Financial resource strain: Not hard at all  . Food insecurity:    Worry: Never true    Inability: Never true  . Transportation needs:    Medical: No    Non-medical: No  Tobacco Use  . Smoking status: Former Smoker    Packs/day: 0.00    Years: 30.00  Pack years: 0.00    Types: Pipe    Last attempt to quit: 07/04/2008    Years since quitting: 9.9  . Smokeless tobacco: Former Network engineer and Sexual Activity  . Alcohol use: Yes    Alcohol/week: 0.0 standard drinks    Comment: Very Infrequently   . Drug use: No  . Sexual activity: Not Currently  Lifestyle  . Physical activity:    Days per week: 0 days    Minutes per session: 0 min  . Stress: Not at all  Relationships  . Social connections:    Talks on phone: More than three times a week    Gets together: More than three times a week    Attends religious service: Never    Active member of club or organization: Not on file    Attends meetings of clubs or organizations: Never    Relationship status: Not on file  Other Topics Concern  . Not on file  Social History Narrative  . Not on file     Family History:  The patient's family history includes COPD in his father; Coronary artery disease in his father; Dementia (age of onset: 63) in his sister; Diabetes in his brother and sister; Hepatitis in his mother.   ROS:  + arthritis in multiple joints. Please see the history of present illness. All other systems are reviewed and otherwise negative.    PHYSICAL EXAM:   VS:  BP (!) 145/85   Pulse (!) 59   Ht _0  (1.727 m)   Wt 270 lb 1.9 oz (122.5 kg)   SpO2 95%   BMI 41.07 kg/m   BMI: Body mass index is 41.07 kg/m. GEN: Well nourished, well developed WM, obese, in no  acute distress  HEENT: normocephalic, atraumatic Neck: no JVD, carotid bruits, or masses Cardiac: RRR; no murmurs, rubs, or gallops, no edema  Respiratory:  clear to auscultation bilaterally, normal work of breathing GI: soft, nontender, nondistended, + BS MS: no deformity or atrophy  Skin: warm and dry, no rash Neuro:  Alert and Oriented x 3, Strength and sensation are intact, follows commands Psych: euthymic mood, full affect  Wt Readings from Last 3 Encounters:  06/07/18 270 lb 1.9 oz (122.5 kg)  05/28/18 265 lb (120.2 kg)  05/17/18 268 lb (121.6 kg)    Studies/Labs Reviewed:   EKG: Personally reviewed, shows sinus rhythm first degree AV block, they're all nines LVH, nonspecific ST-T wave abnormalities unchanged from prior.  Recent Labs: 02/01/2018: TSH 1.390 04/18/2018: ALT 32; BUN 13; Creatinine, Ser 1.02; Hemoglobin 15.1; Platelets 169.0; Potassium 4.8; Sodium 136   Lipid Panel    Component Value Date/Time   CHOL 131 04/18/2018 1023   CHOL 128 09/08/2017 1202   CHOL 159 03/27/2013 0818   TRIG 131.0 04/18/2018 1023   TRIG 127 03/27/2013 0818   HDL 40.70 04/18/2018 1023   HDL 47 09/08/2017 1202   HDL 45 03/27/2013 0818   CHOLHDL 3 04/18/2018 1023   VLDL 26.2 04/18/2018 1023   LDLCALC 64 04/18/2018 1023   LDLCALC 56 09/08/2017 1202   LDLCALC 89 03/27/2013 0818   LDLDIRECT 87.6 09/19/2012 1206   Additional studies/ records that were reviewed today include: Summarized above  Coronary CTA: 17/2019  1. Coronary calcium score of 271. This was 83 percentile for age and sex matched control. 2. Normal coronary origin with right dominance. 3. Moderate CAD in the mid LAD and severe CAD in the OM1 branch, additional analysis with CT  FFR will be performed. 4. Dilated pulmonary artery measuring 34 mm suggestive of pulmonary hypertension.  CT FFR: 1. Left Main:  No significant stenosis. 2. LAD: Proximal LAD CT FFR: 0.9.  Mid LAD: 0.84.  Distal LAD: 0.83. 3. LCX: CT FFR  0.92. 4. OM1: 0.8. 5. RCA: CT FFR 0.94.  IMPRESSION: 1.  CT FFR analysis showed a significant stenosis in the OM1 branch. EKG performed today 01/03/2018 shows sinus bradycardia with first-degree AV block otherwise normal EKG and unchanged from prior.  Left heart cath: 01/12/2018  The left ventricular systolic function is normal.  LV end diastolic pressure is normal.  The left ventricular ejection fraction is 55-65% by visual estimate. 1. Minimal nonobstructive CAD 2. Normal LV function 3. Normal LVEDP Plan: medical management.    ASSESSMENT & PLAN:   1. Acute on chronic diastolic CHF 1. I will start Lasix 40 mg daily 2. Obtain BMP and BNP today 3. Follow-up in 3 weeks with a PA 4. Educated about proper low-sodium diet and necessity of exercise. 2. Symptomatic junctional bradycardia 1. Pacemaker indicated per Dr. Curt Bears 2. The patient was encouraged to schedule rather sooner than later. 3. CAD 1. Nonobstructive, continue we will continue aspirin 81 mg daily, rosuvastatin, losartan 25 mg daily, no beta-blocker secondary to symptomatic bradycardia. 4. Obstructive sleep apnea - The patient has f/u coming up at the [place] [facility] soon to review his CPAP therapy and sleep apnea. He is inconsistent with use which could also contribute to BP lability. 5. Hypertension - uncontrolled, will add Lasix 40 mg daily. 6. Hyperlipidemia - on Crestor 3 times a week, his lipids were at goal in March 2019 and he had normal LFTs.  Disposition: F/u with in 3 weeks..  Medication Adjustments/Labs and Tests Ordered: Current medicines are reviewed at length with the patient today.  Concerns regarding medicines are outlined above. Medication changes, Labs and Tests ordered today are summarized above and listed in the Patient Instructions accessible in Encounters.   Signed, Ena Dawley, MD 06/07/2018 11:20 AM    [address], [place], [place]  [address] Phone: [phone]; Fax: [phone]

## 2018-06-07 NOTE — Telephone Encounter (Signed)
lmtcb to arrange PPM implant

## 2018-06-07 NOTE — Patient Instructions (Signed)
Medication Instructions:   START LASIX 40 MG ONCE DAILY   If you need a refill on your cardiac medications before your next appointment, please call your pharmacy.     Lab work:  TODAY--BMET AND PRO-BNP  If you have labs (blood work) drawn today and your tests are completely normal, you will receive your results only by: Marland Kitchen MyChart Message (if you have MyChart) OR . A paper copy in the mail If you have any lab test that is abnormal or we need to change your treatment, we will call you to review the results.     Follow-Up:  4 WEEKS WITH AN EXTENDER IN OUR OFFICE

## 2018-06-11 NOTE — Telephone Encounter (Signed)
Follow up    Patients wife [name] is returning call in reference to scheduling spouse implant procedure. Please call to discuss.

## 2018-06-11 NOTE — Telephone Encounter (Signed)
Pt would like PPM implant shceduled for 12/30.  Made aware I would arrange and call back this week to go over instructions,etc. Wife is agreeable to plan.

## 2018-06-12 ENCOUNTER — Telehealth: Payer: Self-pay | Admitting: Internal Medicine

## 2018-06-13 NOTE — Telephone Encounter (Signed)
Per [facility] provider portal, kit has already been delivered to patient. There were originally 2 faxes sent, 1 was sent in error before dob was added. The other was sent with all information.

## 2018-06-14 ENCOUNTER — Ambulatory Visit (INDEPENDENT_AMBULATORY_CARE_PROVIDER_SITE_OTHER): Payer: Medicare Other | Admitting: Family Medicine

## 2018-06-14 VITALS — BP 122/70 | HR 61 | Ht 68.0 in

## 2018-06-14 DIAGNOSIS — M353 Polymyalgia rheumatica: Secondary | ICD-10-CM | POA: Diagnosis not present

## 2018-06-14 DIAGNOSIS — M25512 Pain in left shoulder: Secondary | ICD-10-CM | POA: Diagnosis not present

## 2018-06-14 DIAGNOSIS — M25511 Pain in right shoulder: Secondary | ICD-10-CM | POA: Diagnosis not present

## 2018-06-14 DIAGNOSIS — I2510 Atherosclerotic heart disease of native coronary artery without angina pectoris: Secondary | ICD-10-CM

## 2018-06-14 DIAGNOSIS — G8929 Other chronic pain: Secondary | ICD-10-CM

## 2018-06-14 NOTE — Progress Notes (Signed)
[facility] [address], [place] [address] Phone: [phone] Subjective:   Jeff Weaver, am serving as a scribe for Jeff. Hulan Weaver.  I'm seeing this patient by the request  of:  CC: Bilateral shoulder pain  [id number]  Jeff Weaver is a 72 y.o. male coming in with complaint of bilateral shoulder pain, left worse than right. Pain for over 7 months in left shoulder. Has suffered dislocations in both shoulders over the years. Does have a locking sensation in the right. Denies any radiating symptoms. Has decreased ROM. Pain and tightness is worse in the morning. Uses Tylenol for pain.   Patient does give a past medical history significant for multiple dislocations on the right side of the shoulder while he was in the [employer].  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     Past Medical History:  Diagnosis Date  . Aortic insufficiency    a. mild 2017 echo.  . Cerebral aneurysm    a. s/p clipping at [facility] in 8/13 c/b short-term memory loss, cerebral hemorrhage and seizure disorder s/p VP shunt.  . Cerebral hemorrhage ([place])   . Chronic diastolic CHF (congestive heart failure) ([place])   . CVA (cerebral infarction)   . Diverticulosis   . Enteritis (regional)    Jeff Weaver  . First degree AV block   . GI bleed   . HTN (hypertension)   . Hyperlipidemia   . Hypertensive heart disease   . IBS (irritable bowel syndrome)   . Mild CAD    Cardiac cath 01/12/18 showed minimal nonobstructive CAD, normal LVEF, normal LVEDP.  . Orthostatic hypotension   . Pre-diabetes   . Regional enteritis of large intestine ([place]) since 1978  . Rheumatoid arthritis(714.0)    dxed in [employer] in 1980s  . Second degree AV block, Mobitz type I   . Seizures ([place])   . Sinus bradycardia   . Sleep apnea    Past Surgical History:  Procedure Laterality Date  . CHOLECYSTECTOMY N/A 01/07/2016   Procedure: LAPAROSCOPIC CHOLECYSTECTOMY ;   Surgeon: Jeff Keens, MD;  Location: [facility];  Service: General;  Laterality: N/A;  . cns shunt  02/23/12  . COLONOSCOPY W/ POLYPECTOMY  1978   negative 2009, Jeff Weaver. Due 2014  . CRANIOTOMY  02/02/12   Jeff Harvel Ricks, [place] of aneurysm  . 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 HERNIA REPAIR    . LEFT HEART CATH AND CORONARY ANGIOGRAPHY N/A 01/12/2018   Procedure: LEFT HEART CATH AND CORONARY ANGIOGRAPHY;  Surgeon: Martinique, Peter M, MD;  Location: [facility];  Service: Cardiovascular;  Laterality: N/A;  . LEFT HEART CATHETERIZATION WITH CORONARY ANGIOGRAM N/A 11/26/2013   Procedure: LEFT HEART CATHETERIZATION WITH CORONARY ANGIOGRAM;  Surgeon: [name], MD;  Location: [facility] CATH LAB;  Service: Cardiovascular;  Laterality: N/A;  . NOSE SURGERY    . SHOULDER SURGERY  1997  . TONSILLECTOMY AND ADENOIDECTOMY    . VENTRICULOPERITONEAL SHUNT  02-23-12   INSERTION OF RIGHT FRONTAL VENTRICULOPERITONEAL SHUNT WITH A CODMAN HAKIM PROGRAMMABLE VALVE   Social History   Socioeconomic History  . Marital status: Married    Spouse name: Not on file  . Number of children: 1  . Years of education: Not on file  . Highest education level: Not on file  Occupational History  . Occupation:  retired  Scientific laboratory technician  . Financial resource strain: Not hard at all  . Food insecurity:    Worry: Never true    Inability: Never true  . Transportation needs:    Medical: Weaver    Non-medical: Weaver  Tobacco Use  . Smoking status: Former Smoker    Packs/day: 0.00    Years: 30.00    Pack years: 0.00    Types: Pipe    Last attempt to quit: 07/04/2008    Years since quitting: 9.9  . Smokeless tobacco: Former Network engineer and Sexual Activity  . Alcohol use: Yes    Alcohol/week: 0.0 standard drinks    Comment: Very Infrequently   . Drug use: Weaver  . Sexual activity: Not Currently  Lifestyle  . Physical activity:    Days per  week: 0 days    Minutes per session: 0 min  . Stress: Not at all  Relationships  . Social connections:    Talks on phone: More than three times a week    Gets together: More than three times a week    Attends religious service: Never    Active member of club or organization: Not on file    Attends meetings of clubs or organizations: Never    Relationship status: Not on file  Other Topics Concern  . Not on file  Social History Narrative  . Not on file   Allergies  Allergen Reactions  . Haloperidol Rash and Other (See Comments)    Hallucinations, aggitation  Other reaction(s): Other Hallucinations Hallucinations Hallucinations Hallucinations  . Rosuvastatin     Leg pain mainly in knees  Other reaction(s): Other (See Comments) Leg pain mainly in knees Other reaction(s): Other sensitivity Leg pain mainly in knees Leg pain mainly in knees Leg pain mainly in knees  . Simvastatin     2007: nausea & vomiting ; [facility] ( he does not remember such)  . Lisinopril     Cough  Other reaction(s): Cough (ALLERGY/intolerance) Cough  . Nifedipine     Thought to contribute to lower extremity edema Other reaction(s): Other (See Comments) Thought to contribute to lower extremity edema  . Pravastatin     [facility] by [facility]; "myositis" Ok to take per pt's wife/Jeff. Linna Darner   Family History  Problem Relation Age of Onset  . COPD Father   . Coronary artery disease Father        MI in 40s  . Hepatitis Mother   . Diabetes Sister   . Dementia Sister 43  . Diabetes Brother   . Colon cancer Neg Hx      Current Outpatient Medications (Cardiovascular):  .  furosemide (LASIX) 40 MG tablet, Take 1 tablet (40 mg total) by mouth daily. Marland Kitchen  losartan (COZAAR) 25 MG tablet, TAKE 1 TABLET TWICE A DAY .  nitroGLYCERIN (NITROSTAT) 0.4 MG SL tablet, Place 1 tablet (0.4 mg total) under the tongue every 5 (five) minutes as needed for chest pain. .  rosuvastatin (CRESTOR) 5 MG tablet, Take 1 tablet three  times week and increase as tolerated  Current Outpatient Medications (Respiratory):  .  cetirizine (ZYRTEC) 10 MG tablet, Take 1 tablet (10 mg total) by mouth daily.  Current Outpatient Medications (Analgesics):  .  acetaminophen (TYLENOL) 325 MG tablet, Take 325-650 mg by mouth every 6 (six) hours as needed for headache. Marland Kitchen  aspirin EC 81 MG tablet, Take 1 tablet (81 mg total) by mouth daily.  Current Outpatient Medications (Hematological):  Marland Kitchen  vitamin B-12 (CYANOCOBALAMIN) 1000 MCG tablet, Take 1,000 mcg by mouth daily.  Current Outpatient Medications (Other):  .  amoxicillin (AMOXIL) 500 MG capsule, Take 2,000 mg by mouth as directed. Take 2000 mg by mouth 1 hour prior to dental appointments .  blood glucose meter kit and supplies KIT, Dispense based on patient and insurance preference. Use up to four times daily as directed. (prediabetes hypoglycemia). .  cholecalciferol (VITAMIN D) 1000 units tablet, Take 1,000 Units by mouth daily. .  Lacosamide 100 MG TABS, Take 200 mg by mouth 2 (two) times daily.  Marland Kitchen  levETIRAcetam (KEPPRA) 500 MG tablet, Take 1,000 mg by mouth 2 (two) times daily.  Marland Kitchen  LORazepam (ATIVAN) 1 MG tablet, Take 1 mg by mouth every 6 (six) hours as needed for seizure.  .  mirabegron ER (MYRBETRIQ) 25 MG TB24 tablet, Take 25 mg by mouth every evening.  .  NON FORMULARY, CPAP Machine: authorized by [facility] in [place] .  pyridOXINE (VITAMIN B-6) 100 MG tablet, Take 100 mg by mouth daily. .  Tamsulosin HCl (FLOMAX) 0.4 MG CAPS, Take 0.4 mg by mouth every evening.  .  thiamine 100 MG tablet, Take 100 mg by mouth daily.    Past medical history, social, surgical and family history all reviewed in electronic medical record.  Weaver pertanent information unless stated regarding to the chief complaint.   Review of Systems:  Weaver headache, visual changes, nausea, vomiting, diarrhea, constipation, dizziness, abdominal pain, skin rash, fevers, chills, night sweats, weight loss, swollen lymph  nodes, , chest pain, shortness of breath, mood changes.  Significant bony memory problems, muscle aches, joint swelling, body aches  Objective  Blood pressure 122/70, pulse 61, height 5' 8"  (1.727 m), SpO2 95 %.    General: Weaver apparent distress alert and oriented x3 mood and affect normal, dressed appropriately.  HEENT: Pupils equal, extraocular movements intact  Respiratory: Patient's speak in full sentences and does not appear short of breath  Cardiovascular: 2+lower extremity edema, non tender, Weaver erythema  Skin: Warm dry intact with Weaver signs of infection or rash on extremities or on axial skeleton.  Abdomen: Soft morbidly obese Neuro: Cranial nerves II through XII are intact, neurovascularly intact in all extremities with 2+ DTRs and 2+ pulses.  Lymph: Weaver lymphadenopathy of posterior or anterior cervical chain or axillae bilaterally.  Gait antalgic MSK:  tender with limited range of motion and has some mild instability decreased strength and tone of , elbows, wrist, hip, knee and ankles bilaterally.   Right shoulder exam does show significant instability of multiple joints.  Patient does have some mild impingement noted with Neer's and Hawkins.  4+ out of 5 strength though noted.  Mild limited range of motion in all planes by at least 5 degrees.  Left shoulder exam shows that patient does have decreased range of motion of only forward flexion of 140 degrees, minimal external rotation, internal rotation to the lateral hip.  5 out of 5 strength of the rotator cuff noted.  [id number]; 15 additional minutes spent for Therapeutic exercises as stated in above notes.  This included exercises focusing on stretching, strengthening, with significant focus on eccentric aspects.   Long term goals include an improvement in range of motion, strength, endurance as well as avoiding reinjury. Patient's frequency would include in 1-2 times a day, 3-5 times a week for a duration of 6-12 weeks. Shoulder Exercises  that included:  Basic scapular stabilization to include adduction and depression of scapula [place], focusing  on proper movement and good control Internal and External rotation utilizing a theraband, with elbow tucked at side entire time Rows with theraband   Proper technique shown and discussed handout in great detail with ATC.  All questions were discussed and answered.      Impression and Recommendations:     This case required medical decision making of moderate complexity. The above documentation has been reviewed and is accurate and complete Lyndal Pulley, DO       Note: This dictation was prepared with Dragon dictation along with smaller phrase technology. Any transcriptional errors that result from this process are unintentional.

## 2018-06-14 NOTE — Assessment & Plan Note (Signed)
Bilateral shoulder pain.  Patient does have polymyalgia rheumatica.  I do believe likely these are all contributing to some of the discomfort and pain.  Home exercises given, discussed which activities to do which wants to avoid.  Discussed over-the-counter medications.  Do not want to be highly aggressive secondary to patient's other chronic comorbidities.  Patient will follow-up with me again in 4 to 8 weeks.

## 2018-06-14 NOTE — Patient Instructions (Signed)
Good to see you  Ice 20 minutes 2 times daily. Usually after activity and before bed. Exercises 3 times a week.  Keep hands within peripheral vision  The right shoulder is unstable secondary to the multiple dislocations . 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

## 2018-06-15 ENCOUNTER — Encounter: Payer: Self-pay | Admitting: Family Medicine

## 2018-06-15 NOTE — Telephone Encounter (Signed)
Procedure instructions reviewed w/ wife. She verbalized understanding. She understands office will call to arrange 91 day post procedure follow up

## 2018-06-18 DIAGNOSIS — Z982 Presence of cerebrospinal fluid drainage device: Secondary | ICD-10-CM | POA: Diagnosis not present

## 2018-06-18 DIAGNOSIS — Z9889 Other specified postprocedural states: Secondary | ICD-10-CM | POA: Diagnosis not present

## 2018-06-18 DIAGNOSIS — G40909 Epilepsy, unspecified, not intractable, without status epilepticus: Secondary | ICD-10-CM | POA: Diagnosis not present

## 2018-06-18 DIAGNOSIS — G9349 Other encephalopathy: Secondary | ICD-10-CM | POA: Diagnosis not present

## 2018-06-18 DIAGNOSIS — Z79899 Other long term (current) drug therapy: Secondary | ICD-10-CM | POA: Diagnosis not present

## 2018-06-18 DIAGNOSIS — G918 Other hydrocephalus: Secondary | ICD-10-CM | POA: Diagnosis not present

## 2018-06-29 ENCOUNTER — Telehealth: Payer: Self-pay | Admitting: Cardiology

## 2018-06-29 NOTE — Telephone Encounter (Signed)
Received call back from neuro office stating that Barnetta Hammersmith, NP, w/ [facility], spoke w/ Dr. Bridgette Habermann who states PPM can be implanted on left/right side and should not interfere. Called wife and informed her of news. Agreeable to continue w/ planned procedure Monday.  They will call the office over the weekend if pt decides to post pone procedure.  Cath lab aware pt needs MRI compatible device, along w/ Camnitz.

## 2018-06-29 NOTE — Telephone Encounter (Signed)
New Message   Patients wife [name] is calling in reference to her husband upcoming procedure. She has some questions that she wants to ask before Monday. Please call to discuss. Alternate contact number is [phone].

## 2018-06-29 NOTE — Telephone Encounter (Signed)
Returned call to wife.  She is calling to see if we have spoken w/ neuro about pt's shunt and PPM implant that is scheduled for Monday.  She wants to make sure there is no issues.   She also want to ensure pt will have an MRI compatible device b/c he has to have those every so often d/t his seizures.  Aware that I will forward info to Dr. Curt Bears. I will discuss shunt/neuro w/ Camnitz and let her know by the end of the day. She appreciates the return call and is willing to reschedule to next year if needed.

## 2018-07-02 ENCOUNTER — Encounter: Payer: Self-pay | Admitting: Physician Assistant

## 2018-07-02 ENCOUNTER — Other Ambulatory Visit: Payer: Self-pay

## 2018-07-02 ENCOUNTER — Ambulatory Visit (HOSPITAL_COMMUNITY)
Admission: RE | Disposition: A | Discharge status: Home / Self Care | Payer: Self-pay | Source: Home / Self Care | Attending: Cardiology

## 2018-07-02 ENCOUNTER — Ambulatory Visit (HOSPITAL_COMMUNITY)
Admission: RE | Admit: 2018-07-02 | Discharge: 2018-07-03 | Disposition: A | Discharge status: Home / Self Care | Payer: Medicare Other | Attending: Cardiology | Admitting: Cardiology

## 2018-07-02 DIAGNOSIS — I061 Rheumatic aortic insufficiency: Secondary | ICD-10-CM | POA: Insufficient documentation

## 2018-07-02 DIAGNOSIS — Z8249 Family history of ischemic heart disease and other diseases of the circulatory system: Secondary | ICD-10-CM | POA: Diagnosis not present

## 2018-07-02 DIAGNOSIS — R7303 Prediabetes: Secondary | ICD-10-CM | POA: Diagnosis not present

## 2018-07-02 DIAGNOSIS — I5032 Chronic diastolic (congestive) heart failure: Secondary | ICD-10-CM | POA: Insufficient documentation

## 2018-07-02 DIAGNOSIS — G4730 Sleep apnea, unspecified: Secondary | ICD-10-CM | POA: Insufficient documentation

## 2018-07-02 DIAGNOSIS — E785 Hyperlipidemia, unspecified: Secondary | ICD-10-CM | POA: Diagnosis not present

## 2018-07-02 DIAGNOSIS — I493 Ventricular premature depolarization: Secondary | ICD-10-CM | POA: Diagnosis not present

## 2018-07-02 DIAGNOSIS — Z8673 Personal history of transient ischemic attack (TIA), and cerebral infarction without residual deficits: Secondary | ICD-10-CM | POA: Diagnosis not present

## 2018-07-02 DIAGNOSIS — Z955 Presence of coronary angioplasty implant and graft: Secondary | ICD-10-CM | POA: Insufficient documentation

## 2018-07-02 DIAGNOSIS — Z833 Family history of diabetes mellitus: Secondary | ICD-10-CM | POA: Insufficient documentation

## 2018-07-02 DIAGNOSIS — M069 Rheumatoid arthritis, unspecified: Secondary | ICD-10-CM | POA: Diagnosis not present

## 2018-07-02 DIAGNOSIS — R569 Unspecified convulsions: Secondary | ICD-10-CM | POA: Insufficient documentation

## 2018-07-02 DIAGNOSIS — Z79899 Other long term (current) drug therapy: Secondary | ICD-10-CM | POA: Insufficient documentation

## 2018-07-02 DIAGNOSIS — Z95818 Presence of other cardiac implants and grafts: Secondary | ICD-10-CM

## 2018-07-02 DIAGNOSIS — Z7982 Long term (current) use of aspirin: Secondary | ICD-10-CM | POA: Diagnosis not present

## 2018-07-02 DIAGNOSIS — Z87891 Personal history of nicotine dependence: Secondary | ICD-10-CM | POA: Insufficient documentation

## 2018-07-02 DIAGNOSIS — Z8679 Personal history of other diseases of the circulatory system: Secondary | ICD-10-CM | POA: Diagnosis not present

## 2018-07-02 DIAGNOSIS — Z7951 Long term (current) use of inhaled steroids: Secondary | ICD-10-CM | POA: Insufficient documentation

## 2018-07-02 DIAGNOSIS — I495 Sick sinus syndrome: Secondary | ICD-10-CM | POA: Diagnosis present

## 2018-07-02 DIAGNOSIS — I110 Hypertensive heart disease with heart failure: Secondary | ICD-10-CM | POA: Insufficient documentation

## 2018-07-02 DIAGNOSIS — Z888 Allergy status to other drugs, medicaments and biological substances status: Secondary | ICD-10-CM | POA: Diagnosis not present

## 2018-07-02 HISTORY — PX: PACEMAKER IMPLANT: EP1218

## 2018-07-02 LAB — CBC
HCT: 44 % (ref 39.0–52.0)
Hemoglobin: 14.7 g/dL (ref 13.0–17.0)
MCH: 31.3 pg (ref 26.0–34.0)
MCHC: 33.4 g/dL (ref 30.0–36.0)
MCV: 93.6 fL (ref 80.0–100.0)
Platelets: 163 10*3/uL (ref 150–400)
RBC: 4.7 MIL/uL (ref 4.22–5.81)
RDW: 12.8 % (ref 11.5–15.5)
WBC: 9.3 10*3/uL (ref 4.0–10.5)
nRBC: 0 % (ref 0.0–0.2)

## 2018-07-02 LAB — BASIC METABOLIC PANEL
Anion gap: 11 (ref 5–15)
BUN: 15 mg/dL (ref 8–23)
CO2: 26 mmol/L (ref 22–32)
Calcium: 9.2 mg/dL (ref 8.9–10.3)
Chloride: 99 mmol/L (ref 98–111)
Creatinine, Ser: 1.06 mg/dL (ref 0.61–1.24)
GFR calc Af Amer: >60 mL/min (ref >60)
GFR calc non Af Amer: >60 mL/min (ref >60)
Glucose, Bld: 118 mg/dL — ABNORMAL HIGH (ref 70–99)
Potassium: 4.3 mmol/L (ref 3.5–5.1)
Sodium: 136 mmol/L (ref 135–145)

## 2018-07-02 LAB — SURGICAL PCR SCREEN
MRSA, PCR: NEGATIVE
Staphylococcus aureus: NEGATIVE

## 2018-07-02 LAB — GLUCOSE, CAPILLARY: Glucose-Capillary: 97 mg/dL (ref 70–99)

## 2018-07-02 SURGERY — PACEMAKER IMPLANT

## 2018-07-02 MED ORDER — SODIUM CHLORIDE 0.9 % IV SOLN
INTRAVENOUS | Status: DC | PRN
  Administered 2018-07-02 – 2018-07-03 (×2): 250 mL via INTRAVENOUS

## 2018-07-02 MED ORDER — NITROGLYCERIN 0.4 MG SL SUBL: 0.4000 mg | SUBLINGUAL_TABLET | SUBLINGUAL | Status: DC | PRN

## 2018-07-02 MED ORDER — ACETAMINOPHEN 325 MG PO TABS: 325.0000 mg | ORAL_TABLET | ORAL | Status: DC | PRN

## 2018-07-02 MED ORDER — LORAZEPAM 1 MG PO TABS: 1.0000 mg | ORAL_TABLET | Freq: Four times a day (QID) | ORAL | Status: DC | PRN

## 2018-07-02 MED ORDER — MIDAZOLAM HCL 5 MG/5ML IJ SOLN
INTRAMUSCULAR | Status: DC | PRN
  Administered 2018-07-02 (×5): 1 mg via INTRAVENOUS

## 2018-07-02 MED ORDER — LACOSAMIDE 50 MG PO TABS
200.0000 mg | ORAL_TABLET | Freq: Two times a day (BID) | ORAL | Status: DC
  Administered 2018-07-02 – 2018-07-03 (×2): 200 mg via ORAL
  Filled 2018-07-02 (×2): qty 4

## 2018-07-02 MED ORDER — FENTANYL CITRATE (PF) 100 MCG/2ML IJ SOLN
INTRAMUSCULAR | Status: DC | PRN
  Administered 2018-07-02 (×4): 25 ug via INTRAVENOUS

## 2018-07-02 MED ORDER — SODIUM CHLORIDE 0.9 % IV SOLN
80.0000 mg | INTRAVENOUS | Status: AC
  Administered 2018-07-02: 80 mg
  Filled 2018-07-02: qty 2

## 2018-07-02 MED ORDER — CHLORHEXIDINE GLUCONATE 4 % EX LIQD
60.0000 mL | Freq: Once | CUTANEOUS | Status: DC
  Filled 2018-07-02: qty 60

## 2018-07-02 MED ORDER — VITAMIN D 25 MCG (1000 UNIT) PO TABS
1000.0000 [IU] | ORAL_TABLET | Freq: Every day | ORAL | Status: DC
  Administered 2018-07-03: 1000 [IU] via ORAL
  Filled 2018-07-02: qty 1

## 2018-07-02 MED ORDER — AMOXICILLIN 500 MG PO CAPS: 2000.0000 mg | ORAL_CAPSULE | ORAL | Status: DC

## 2018-07-02 MED ORDER — CEFAZOLIN SODIUM-DEXTROSE 1-4 GM/50ML-% IV SOLN
1.0000 g | Freq: Four times a day (QID) | INTRAVENOUS | Status: AC
  Administered 2018-07-02 – 2018-07-03 (×3): 1 g via INTRAVENOUS
  Filled 2018-07-02 (×3): qty 50

## 2018-07-02 MED ORDER — LIDOCAINE HCL (PF) 1 % IJ SOLN
INTRAMUSCULAR | Status: DC | PRN
  Administered 2018-07-02: 60 mL

## 2018-07-02 MED ORDER — VITAMIN B-1 100 MG PO TABS
100.0000 mg | ORAL_TABLET | Freq: Every day | ORAL | Status: DC
  Administered 2018-07-03: 100 mg via ORAL
  Filled 2018-07-02 (×4): qty 1

## 2018-07-02 MED ORDER — SODIUM CHLORIDE 0.9 % IV SOLN
INTRAVENOUS | Status: AC
  Filled 2018-07-02: qty 2

## 2018-07-02 MED ORDER — FLUTICASONE PROPIONATE 50 MCG/ACT NA SUSP
1.0000 | Freq: Every day | NASAL | Status: DC
  Filled 2018-07-02: qty 16

## 2018-07-02 MED ORDER — LIDOCAINE HCL (PF) 1 % IJ SOLN
INTRAMUSCULAR | Status: AC
  Filled 2018-07-02: qty 30

## 2018-07-02 MED ORDER — VITAMIN B-12 1000 MCG PO TABS
1000.0000 ug | ORAL_TABLET | Freq: Every day | ORAL | Status: DC
  Administered 2018-07-03: 1000 ug via ORAL
  Filled 2018-07-02: qty 1

## 2018-07-02 MED ORDER — SODIUM CHLORIDE 0.9 % IV SOLN
INTRAVENOUS | Status: DC
  Administered 2018-07-02: 08:00:00 via INTRAVENOUS

## 2018-07-02 MED ORDER — MUPIROCIN 2 % EX OINT
TOPICAL_OINTMENT | CUTANEOUS | Status: AC
  Administered 2018-07-02: 1
  Filled 2018-07-02: qty 22

## 2018-07-02 MED ORDER — MIDAZOLAM HCL 5 MG/5ML IJ SOLN
INTRAMUSCULAR | Status: AC
  Filled 2018-07-02: qty 5

## 2018-07-02 MED ORDER — MIRABEGRON ER 25 MG PO TB24
25.0000 mg | ORAL_TABLET | Freq: Every evening | ORAL | Status: DC
  Administered 2018-07-02: 25 mg via ORAL
  Filled 2018-07-02: qty 1

## 2018-07-02 MED ORDER — INSULIN ASPART 100 UNIT/ML ~~LOC~~ SOLN: 0.0000 [IU] | Freq: Three times a day (TID) | SUBCUTANEOUS | Status: DC

## 2018-07-02 MED ORDER — LACOSAMIDE 100 MG PO TABS: 200.0000 mg | ORAL_TABLET | Freq: Two times a day (BID) | ORAL | Status: DC

## 2018-07-02 MED ORDER — HEPARIN (PORCINE) IN NACL 1000-0.9 UT/500ML-% IV SOLN
INTRAVENOUS | Status: DC | PRN
  Administered 2018-07-02: 500 mL

## 2018-07-02 MED ORDER — TAMSULOSIN HCL 0.4 MG PO CAPS
0.4000 mg | ORAL_CAPSULE | Freq: Every evening | ORAL | Status: DC
  Administered 2018-07-02: 0.4 mg via ORAL
  Filled 2018-07-02: qty 1

## 2018-07-02 MED ORDER — FENTANYL CITRATE (PF) 100 MCG/2ML IJ SOLN
INTRAMUSCULAR | Status: AC
  Filled 2018-07-02: qty 2

## 2018-07-02 MED ORDER — DEXTROSE 5 % IV SOLN
3.0000 g | INTRAVENOUS | Status: AC
  Administered 2018-07-02: 3 g via INTRAVENOUS
  Filled 2018-07-02: qty 3

## 2018-07-02 MED ORDER — ASPIRIN EC 81 MG PO TBEC
81.0000 mg | DELAYED_RELEASE_TABLET | Freq: Every day | ORAL | Status: DC
  Administered 2018-07-03: 81 mg via ORAL
  Filled 2018-07-02: qty 1

## 2018-07-02 MED ORDER — LOSARTAN POTASSIUM 25 MG PO TABS
25.0000 mg | ORAL_TABLET | Freq: Two times a day (BID) | ORAL | Status: DC
  Administered 2018-07-02 – 2018-07-03 (×2): 25 mg via ORAL
  Filled 2018-07-02 (×2): qty 1

## 2018-07-02 MED ORDER — LEVETIRACETAM 500 MG PO TABS
1000.0000 mg | ORAL_TABLET | Freq: Two times a day (BID) | ORAL | Status: DC
  Administered 2018-07-02 – 2018-07-03 (×2): 1000 mg via ORAL
  Filled 2018-07-02 (×2): qty 2

## 2018-07-02 MED ORDER — ONDANSETRON HCL 4 MG/2ML IJ SOLN: 4.0000 mg | Freq: Four times a day (QID) | INTRAMUSCULAR | Status: DC | PRN

## 2018-07-02 MED ORDER — LORATADINE 10 MG PO TABS
10.0000 mg | ORAL_TABLET | Freq: Every day | ORAL | Status: DC
  Administered 2018-07-03: 10 mg via ORAL
  Filled 2018-07-02: qty 1

## 2018-07-02 MED ORDER — VITAMIN D 1000 UNITS PO TABS: 1000.0000 [IU] | ORAL_TABLET | Freq: Every day | ORAL | Status: DC

## 2018-07-02 MED ORDER — FUROSEMIDE 40 MG PO TABS
40.0000 mg | ORAL_TABLET | Freq: Every day | ORAL | Status: DC
  Administered 2018-07-03: 40 mg via ORAL
  Filled 2018-07-02: qty 1

## 2018-07-02 MED ORDER — VITAMIN B-6 100 MG PO TABS
100.0000 mg | ORAL_TABLET | Freq: Every day | ORAL | Status: DC
  Administered 2018-07-03: 100 mg via ORAL
  Filled 2018-07-02 (×2): qty 1

## 2018-07-02 MED ORDER — IOPAMIDOL (ISOVUE-370) INJECTION 76%
INTRAVENOUS | Status: DC | PRN
  Administered 2018-07-02: 15 mL via INTRAVENOUS

## 2018-07-02 MED ORDER — ACETAMINOPHEN 325 MG PO TABS
325.0000 mg | ORAL_TABLET | Freq: Four times a day (QID) | ORAL | Status: DC | PRN
  Administered 2018-07-02: 325 mg via ORAL
  Administered 2018-07-02: 650 mg via ORAL
  Filled 2018-07-02: qty 2

## 2018-07-02 SURGICAL SUPPLY — 10 items
CABLE SURGICAL S-101-97-12 (CABLE) ×2 IMPLANT
CATH RIGHTSITE C315HIS02 (CATHETERS) ×1 IMPLANT
IPG PACE AZUR XT DR MRI W1DR01 (Pacemaker) IMPLANT
LEAD CAPSURE NOVUS 5076-52CM (Lead) ×1 IMPLANT
LEAD CAPSURE NOVUS 5076-58CM (Lead) ×1 IMPLANT
PACE AZURE XT DR MRI W1DR01 (Pacemaker) ×2 IMPLANT
PAD PRO RADIOLUCENT 2001M-C (PAD) ×2 IMPLANT
SHEATH CLASSIC 7F (SHEATH) ×2 IMPLANT
TRAY PACEMAKER INSERTION (PACKS) ×2 IMPLANT
WIRE HI TORQ VERSACORE-J 145CM (WIRE) ×1 IMPLANT

## 2018-07-02 NOTE — Progress Notes (Signed)
Cardiology Office Note    Date:  07/05/2018  ID:  Jeff Weaver, DOB [birth year], MRN [id number] PCP:  [name], MD  Cardiologist:  Ena Dawley, MD   Chief Complaint: f/u CHF  History of Present Illness:  Jeff Weaver is a 72 y.o. 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 He presents for previously arranged 4 week general cardiology follow-up.  To recap, he was first seen by Dr. [name] 04/2013 for chest pain with normal nuclear stress test. He was admitted 11/2013 for chest pain and elevated troponin (0.74) -> LHC 11/26/13 revealed angiographically normal coronary arteries, mild mid LAD myocardial bridging, moderate to severely elevated LVEDP with systemic hypertension and preserved LVEF. Last echo 2D echo in 01/2016 showed mod LVH, EF 60-65%, grade 2 DD, no RWMA, mild AI. More recently he was seen in the office 01/03/18 with worsening dyspnea on exertion. Cardiac cath 01/12/18 showed minimal nonobstructive CAD, normal LVEF, normal LVEDP. Per cath note, "during recovery patient developed Second degree AV block Mobitz type 1 with HR into the 40s. He is asymptomatic. No prior history of dizziness or syncope. On no rate slowing medication. Baseline Ecg with very long PR interval. May need Holter as outpatient." Prior PFTs showed minimal Obstructive Airways Disease, no significant COPD. Holter 02/2018 showed profound bradycardia with HR down to 30 even during waking hours, AVB with frequently nonconducted P waves, frequent PVCs (7%) and short runs of NSVT. He was referred to EP and the patient initially hoped to avoid pacing but in more recent visits was  amenable to proceeding with pacemaker implantation. He saw Dr. [name] in the interim as well, 06/07/18, reporting worsening LEE and DOE with PND. Lasix 81m was started for suspected acute on chronic diastolic CHF. Labs had shown BNP 480. Otherwise f/u labs 07/02/18 showed Hgb 14.7, K 4.3, Cr 1.06, 04/2018 LDL 64, LFTs wnl, 02/2018 TSH wnl. He underwent PPM implantation 07/02/18 with Medtronic Azure XT DR MRI SureScan dual-chamber pacemaker.  He returns for follow-up with his wife and daughter. He says he knows it's early but he has begun to feel better overall. He is now sleeping through the night without waking up SOB. He is also able to climb stairs without stopping as easily. No CP. No pacer site issues reported. He continues on Lasix. He says he has a history of orthostasis-type dizziness but has not experienced this lately. He had a URI the last few days but is feeling much better today. We discussed Nutrisystem diet per their request and they will investigate sodium content.   Past Medical History:  Diagnosis Date  . Aortic insufficiency    a. mild 2017 echo.  . Cerebral aneurysm    [facility]/b short-term memory loss, cerebral hemorrhage and seizure disorder s/p VP shunt.  . [place]   . Chronic diastolic CHF (congestive heart failure) ([place]   . CVA (cerebral infarction)   . Diverticulosis   . Enteritis (regional)    Dr BOlevia Perches . First degree AV block   . GI bleed   . HTN (hypertension)   . Hyperlipidemia   . Hypertensive heart disease   .  IBS (irritable bowel syndrome)   . Mild CAD    Cardiac cath 01/12/18 showed minimal nonobstructive CAD, normal LVEF, normal LVEDP.  Marland Kitchen NSVT (nonsustained ventricular tachycardia) ([place])   . Orthostatic hypotension   . Pre-diabetes   . PVC's (premature ventricular contractions)   . 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 Rheumatoid arthritis(714.0)    dxed in [employer] in 1980s  . Second  degree AV block, Mobitz type I   . Seizures ([place])   . Sinus bradycardia   . Sleep apnea   . SSS (sick sinus syndrome) ([place])    a. s/p PPM 07/02/18.    Past Surgical History:  Procedure Laterality Date  . CHOLECYSTECTOMY N/A 01/07/2016   Procedure: LAPAROSCOPIC CHOLECYSTECTOMY ;  Surgeon: Coralie Keens, MD;  Location: [place];  Service: General;  Laterality: N/A;  . cns shunt  02/23/12  . COLONOSCOPY W/ POLYPECTOMY  1978   negative 2009, Dr Olevia Perches. Due 2014  . CRANIOTOMY  02/02/12   Dr Harvel Ricks, [place] of aneurysm  . 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 HERNIA REPAIR    . LEFT HEART CATH AND CORONARY ANGIOGRAPHY N/A 01/12/2018   Procedure: LEFT HEART CATH AND CORONARY ANGIOGRAPHY;  Surgeon: Martinique, Peter M, MD;  Location: [place] CV LAB;  Service: Cardiovascular;  Laterality: N/A;  . LEFT HEART CATHETERIZATION WITH CORONARY ANGIOGRAM N/A 11/26/2013   Procedure: LEFT HEART CATHETERIZATION WITH CORONARY ANGIOGRAM;  Surgeon: [name], MD;  Location: [facility] CATH LAB;  Service: Cardiovascular;  Laterality: N/A;  . NOSE SURGERY    . PACEMAKER IMPLANT N/A 07/02/2018   Procedure: PACEMAKER IMPLANT;  Surgeon: Constance Haw, MD;  Location: [place] CV LAB;  Service: Cardiovascular;  Laterality: N/A;  . SHOULDER SURGERY  1997  . TONSILLECTOMY AND ADENOIDECTOMY    . VENTRICULOPERITONEAL SHUNT  02-23-12   INSERTION OF RIGHT FRONTAL VENTRICULOPERITONEAL SHUNT WITH A CODMAN HAKIM PROGRAMMABLE VALVE    Current Medications: Current Meds  Medication Sig  . acetaminophen (TYLENOL) 325 MG tablet Take 325-650 mg by mouth every 6 (six) hours as needed for headache.  Marland Kitchen amoxicillin (AMOXIL) 500 MG capsule Take 2,000 mg by mouth as directed. Take 2000 mg by mouth 1 hour prior to dental appointments  . aspirin EC 81 MG tablet Take 1 tablet (81 mg total) by mouth daily.  . blood glucose meter kit and supplies KIT  Dispense based on patient and insurance preference. Use up to four times daily as directed. (prediabetes hypoglycemia).  . cetirizine (ZYRTEC) 10 MG tablet Take 1 tablet (10 mg total) by mouth daily.  . cholecalciferol (VITAMIN D) 1000 units tablet Take 1,000 Units by mouth daily.  . fluticasone (FLONASE) 50 MCG/ACT nasal spray Place 1-2 sprays into both nostrils daily.  . furosemide (LASIX) 40 MG tablet Take 1 tablet (40 mg total) by mouth daily.  Marland Kitchen levETIRAcetam (KEPPRA) 500 MG tablet Take 1,000 mg by mouth 2 (two) times daily.   Marland Kitchen LORazepam (ATIVAN) 1 MG tablet Take 1 mg by mouth every 6 (six) hours as needed for seizure.   Marland Kitchen losartan (COZAAR) 25 MG tablet TAKE 1 TABLET TWICE A DAY  . mirabegron ER (MYRBETRIQ) 25 MG TB24 tablet Take 25 mg by mouth every evening.   . nitroGLYCERIN (NITROSTAT) 0.4 MG SL tablet Place 1 tablet (0.4 mg total) under the tongue every 5 (five) minutes as needed for chest pain.  . NON FORMULARY  CPAP Machine: authorized by [place] in [place]  . pyridOXINE (VITAMIN B-6) 100 MG tablet Take 100 mg by mouth daily.  . rosuvastatin (CRESTOR) 5 MG tablet Take 1 tablet three times week and increase as tolerated  . Tamsulosin HCl (FLOMAX) 0.4 MG CAPS Take 0.4 mg by mouth every evening.   . thiamine 100 MG tablet Take 100 mg by mouth daily.  Marland Kitchen VIMPAT 100 MG TABS Take 200 mg by mouth 2 (two) times daily.  . vitamin B-12 (CYANOCOBALAMIN) 1000 MCG tablet Take 1,000 mcg by mouth daily.     Allergies:   Haloperidol; Rosuvastatin; Simvastatin; Lisinopril; Nifedipine; and Pravastatin   Social History   Socioeconomic History  . Marital status: Married    Spouse name: Not on file  . Number of children: 1  . Years of education: Not on file  . Highest education level: Not on file  Occupational History  . Occupation: retired  Scientific laboratory technician  . Financial resource strain: Not hard at all  . Food insecurity:    Worry: Never true    Inability: Never true  . Transportation needs:     Medical: No    Non-medical: No  Tobacco Use  . Smoking status: Former Smoker    Packs/day: 0.00    Years: 30.00    Pack years: 0.00    Types: Pipe    Last attempt to quit: 07/04/2008    Years since quitting: 10.0  . Smokeless tobacco: Former Network engineer and Sexual Activity  . Alcohol use: Yes    Alcohol/week: 0.0 standard drinks    Comment: Very Infrequently   . Drug use: No  . Sexual activity: Not Currently  Lifestyle  . Physical activity:    Days per week: 0 days    Minutes per session: 0 min  . Stress: Not at all  Relationships  . Social connections:    Talks on phone: More than three times a week    Gets together: More than three times a week    Attends religious service: Never    Active member of club or organization: Not on file    Attends meetings of clubs or organizations: Never    Relationship status: Not on file  Other Topics Concern  . Not on file  Social History Narrative  . Not on file     Family History:  The patient's family history includes COPD in his father; Coronary artery disease in his father; Dementia (age of onset: 27) in his sister; Diabetes in his brother and sister; Hepatitis in his mother. There is no history of Colon cancer.  ROS:    Please see the history of present illness.  All other systems are reviewed and otherwise negative.    PHYSICAL EXAM:   VS:  BP 102/64   Pulse 71   Ht 5' 8"  (1.727 m)   Wt 264 lb 12.8 oz (120.1 kg)   SpO2 94%   BMI 40.26 kg/m   BMI: Body mass index is 40.26 kg/m. GEN: Well nourished, well developed obese WM, in no acute distress HEENT: normocephalic, atraumatic, mild cobblestoning but no tonsillar exudate or erythema Neck: no JVD, carotid bruits, or masses Cardiac: RRR; no murmurs, rubs, or gallops, no edema  Respiratory:  clear to auscultation bilaterally, normal work of breathing GI: soft, nontender, nondistended, + BS MS: no deformity or atrophy Skin: warm and dry, no rash, pacer site with steri  strips, no hematoma or suppuration Neuro:  Alert and Oriented x 3,  Strength and sensation are intact, follows commands Psych: euthymic mood, full affect  Wt Readings from Last 3 Encounters:  07/05/18 264 lb 12.8 oz (120.1 kg)  07/03/18 260 lb 1.6 oz (118 kg)  06/07/18 270 lb 1.9 oz (122.5 kg)      Studies/Labs Reviewed:   EKG: EKG was not ordered today  Recent Labs: 02/01/2018: TSH 1.390 04/18/2018: ALT 32 06/07/2018: NT-Pro BNP 480 07/02/2018: BUN 15; Creatinine, Ser 1.06; Hemoglobin 14.7; Platelets 163; Potassium 4.3; Sodium 136   Lipid Panel    Component Value Date/Time   CHOL 131 04/18/2018 1023   CHOL 128 09/08/2017 1202   CHOL 159 03/27/2013 0818   TRIG 131.0 04/18/2018 1023   TRIG 127 03/27/2013 0818   HDL 40.70 04/18/2018 1023   HDL 47 09/08/2017 1202   HDL 45 03/27/2013 0818   CHOLHDL 3 04/18/2018 1023   VLDL 26.2 04/18/2018 1023   LDLCALC 64 04/18/2018 1023   LDLCALC 56 09/08/2017 1202   LDLCALC 89 03/27/2013 0818   LDLDIRECT 87.6 09/19/2012 1206    Additional studies/ records that were reviewed today include: Summarized above   ASSESSMENT & PLAN:   1. Dyspnea on exertion/chronic diastolic CHF - improved with addition of Lasix and insertion of PPM. F/u labs 12/30 were satisfactory. Reviewed 2g sodium restriction, 2L fluid restriction, daily weights with patient. He does currently drink a lot of fluid daily and I told him if he scales this back he may be able to decrease Lasix to 1/2 tablet daily to account for the change. His BP is running a little softer today. He is asymptomatic without any other focal causes. I asked him to stop his losartan (currently taking 4m BID), and check BP daily, and restart 240monce daily if he begins to notice SBP running >1>263ystolic. They will call usKoreaf they need to resume this. 2. SSS s/p PPM - he received education in the hospital about motion progression. Site today is without complication. He has formal wound check f/u on  07/18/18. I also asked nurse to send a message to EP scheduler to ensure he has 3 month follow-up with Dr. CaCurt Bears3. PVCs/NSVT - can be followed by EP via pacemaker now to further assess burden. 4. Mild aortic insufficiency - given that symptoms have improved, no current indication to repeat echocardiogram but will be due in the next 1-3 years for general surveillance. This can be determined by primary cardiologist.  Disposition: F/u with Dr. NeMeda Coffeen 4 months.  Medication Adjustments/Labs and Tests Ordered: Current medicines are reviewed at length with the patient today.  Concerns regarding medicines are outlined above. Medication changes, Labs and Tests ordered today are summarized above and listed in the Patient Instructions accessible in Encounters.   Signed, DaCharlie PitterPA-C  07/05/2018 11:41 AM    [place]  [id number]: [phone]: [phone]

## 2018-07-02 NOTE — Discharge Instructions (Signed)
° ° °  Supplemental Discharge Instructions for  Pacemaker/Defibrillator Patients  Activity No heavy lifting or vigorous activity with your left/right arm for 6 to 8 weeks.  Do not raise your left/right arm above your head for one week.  Gradually raise your affected arm as drawn below.             07/06/18                       07/07/18                      07/08/18                     07/09/18 __  NO DRIVING for 1 week  ; you may begin driving on   12/10/77  .  WOUND CARE - Keep the wound area clean and dry.  Do not get this area wet, no showers until cleared to at your wound check visit - The tape/steri-strips on your wound will fall off; do not pull them off.  No bandage is needed on the site.  DO  NOT apply any creams, oils, or ointments to the wound area. - If you notice any drainage or discharge from the wound, any swelling or bruising at the site, or you develop a fever > 101? F after you are discharged home, call the office at once.  Special Instructions - You are still able to use cellular telephones; use the ear opposite the side where you have your pacemaker/defibrillator.  Avoid carrying your cellular phone near your device. - When traveling through airports, show security personnel your identification card to avoid being screened in the metal detectors.  Ask the security personnel to use the hand wand. - Avoid arc welding equipment, MRI testing (magnetic resonance imaging), TENS units (transcutaneous nerve stimulators).  Call the office for questions about other devices. - Avoid electrical appliances that are in poor condition or are not properly grounded. - Microwave ovens are safe to be near or to operate.

## 2018-07-02 NOTE — Progress Notes (Signed)
To the rounding MD in AM 07/03/2018. Please reconcile with Pharmacy time schedules for the patient as he takes at home. Thank you.

## 2018-07-02 NOTE — Consult Note (Addendum)
ELECTROPHYSIOLOGY PROCEDURE DISCHARGE SUMMARY    Patient ID: Jeff Weaver,  MRN: [id number], DOB/AGE: 1946/01/28 72 y.o.  Admit date: 07/02/2018 Discharge date: 07/03/18  Primary Care Physician: Jeff Rail, MD  Primary Cardiologist: Dr. [name] Electrophysiologist: Dr. Curt Bears  Primary Discharge Diagnosis:  Symptomatic bradycardia status post pacemaker implantation this admission  Secondary Discharge Diagnosis:  1. HTN 2. Chronic diastolic CHF 3. Hypertensive heart disease 4. H/o cerebral aneurysm clipping 5. H/o cerebral hemorrhage  6. Seizure d/o w/VP shunt   Allergies  Allergen Reactions  . Haloperidol Rash and Other (See Comments)    Hallucinations, agitation   . Rosuvastatin     Leg pain mainly in knees  Other reaction(s): Other (See Comments) Leg pain mainly in knees Other reaction(s): Other sensitivity Leg pain mainly in knees Leg pain mainly in knees Leg pain mainly in knees  . Simvastatin     2007: nausea & vomiting ; VAH ( he does not remember such)  . Lisinopril Cough  . Nifedipine Swelling    Thought to contribute to lower extremity edema   . Pravastatin     Rxed by [facility] 2009; "myositis" Ok to take per pt's wife/Dr. Linna Darner     Procedures This Admission:  1.  Implantation of a MDT dual chamber PPM on 07/02/18 by Dr Curt Bears.  The patient received a Medtronic model [id number] (serial number [id number]) right atrial lead and a Medtronic model 5076 (serial number [id number]) right ventricular lead, Medtronic Azure XT DR MRI SureScan (serial number [id number]) pacemaker There were no immediate post procedure complications. 2.  CXR on 07/03/18 demonstrated no pneumothorax status post device implantation.   Brief HPI: Jeff Weaver is a 73 y.o. male was referred to electrophysiology in the outpatient setting for consideration of PPM implantation.  Past medical history includes above.  The patient has had symptomatic bradycardia without reversible  causes identified.  Risks, benefits, and alternatives to PPM implantation were reviewed with the patient who wished to proceed.   Hospital Course:  The patient was admitted and underwent implantation of a PPM with details as outlined above.  He was monitored on telemetry overnight which demonstrated SR 1st degree AVblock, intermittent pacing.  Left chest was without hematoma or ecchymosis.  The device was interrogated and found to be functioning normally.  CXR was obtained and demonstrated no pneumothorax status post device implantation, afebrile, no cough, SOB or symptoms of illness, he feels well.  Encouraged deep breathing and OOB.  Wound care, arm mobility, and restrictions were reviewed with the patient and his family at bedside.  The patient was examined by Dr. Curt Bears and considered stable for discharge to home.    Physical Exam: Vitals:   07/02/18 1425 07/02/18 2023 07/03/18 0005 07/03/18 0426  BP: (!) 146/50 (!) 172/84 (!) 161/81 (!) 157/78  Pulse: 62 60 66 67  Resp: 15     Temp:  98.5 F (36.9 C) 98.8 F (37.1 C) 98.7 F (37.1 C)  TempSrc:  Oral Oral Oral  SpO2: 96% 97% 94% 97%  Weight:    118 kg  Height:        GEN- The patient is well appearing, alert and oriented x 3 today.   HEENT: normocephalic, atraumatic; sclera clear, conjunctiva pink; hearing intact; oropharynx clear; neck supple, no JVP Lungs- CTA b/l, normal work of breathing.  No wheezes, rales, rhonchi Heart- RRR, no murmurs, rubs or gallops, PMI not laterally displaced GI- soft, non-tender, non-distended Extremities-  no clubbing, cyanosis, or edema MS- no significant deformity or atrophy Skin- warm and dry, no rash or lesion,  left chest without hematoma/ecchymosis Psych- euthymic mood, full affect Neuro- no gross deficits   Labs:   Lab Results  Component Value Date   WBC 9.3 07/02/2018   HGB 14.7 07/02/2018   HCT 44.0 07/02/2018   MCV 93.6 07/02/2018   PLT 163 07/02/2018    Recent Labs  Lab  07/02/18 0807  NA 136  K 4.3  CL 99  CO2 26  BUN 15  CREATININE 1.06  CALCIUM 9.2  GLUCOSE 118*    Discharge Medications:  Allergies as of 07/03/2018      Reactions   Haloperidol Rash, Other (See Comments)   Hallucinations, agitation    Rosuvastatin    Leg pain mainly in knees Other reaction(s): Other (See Comments) Leg pain mainly in knees Other reaction(s): Other sensitivity Leg pain mainly in knees Leg pain mainly in knees Leg pain mainly in knees   Simvastatin    2007: nausea & vomiting ; VAH ( he does not remember such)   Lisinopril Cough   Nifedipine Swelling   Thought to contribute to lower extremity edema   Pravastatin    Rxed by [facility] 2009; "myositis" Ok to take per pt's wife/Dr. Linna Darner      Medication List    TAKE these medications   acetaminophen 325 MG tablet Commonly known as:  TYLENOL Take 325-650 mg by mouth every 6 (six) hours as needed for headache.   amoxicillin 500 MG capsule Commonly known as:  AMOXIL Take 2,000 mg by mouth as directed. Take 2000 mg by mouth 1 hour prior to dental appointments   aspirin EC 81 MG tablet Take 1 tablet (81 mg total) by mouth daily.   blood glucose meter kit and supplies Kit Dispense based on patient and insurance preference. Use up to four times daily as directed. (prediabetes hypoglycemia).   cetirizine 10 MG tablet Commonly known as:  ZYRTEC Take 1 tablet (10 mg total) by mouth daily.   cholecalciferol 1000 units tablet Commonly known as:  VITAMIN D Take 1,000 Units by mouth daily.   fluticasone 50 MCG/ACT nasal spray Commonly known as:  FLONASE Place 1-2 sprays into both nostrils daily.   furosemide 40 MG tablet Commonly known as:  LASIX Take 1 tablet (40 mg total) by mouth daily.   levETIRAcetam 500 MG tablet Commonly known as:  KEPPRA Take 1,000 mg by mouth 2 (two) times daily.   LORazepam 1 MG tablet Commonly known as:  ATIVAN Take 1 mg by mouth every 6 (six) hours as needed for seizure.    losartan 25 MG tablet Commonly known as:  COZAAR TAKE 1 TABLET TWICE A DAY   MYRBETRIQ 25 MG Tb24 tablet Generic drug:  mirabegron ER Take 25 mg by mouth every evening.   nitroGLYCERIN 0.4 MG SL tablet Commonly known as:  NITROSTAT Place 1 tablet (0.4 mg total) under the tongue every 5 (five) minutes as needed for chest pain.   NON FORMULARY CPAP Machine: authorized by [facility] in [place]   pyridOXINE 100 MG tablet Commonly known as:  VITAMIN B-6 Take 100 mg by mouth daily.   rosuvastatin 5 MG tablet Commonly known as:  CRESTOR Take 1 tablet three times week and increase as tolerated What changed:    how much to take  how to take this  when to take this  additional instructions   tamsulosin 0.4 MG Caps capsule Commonly  known as:  FLOMAX Take 0.4 mg by mouth every evening.   thiamine 100 MG tablet Take 100 mg by mouth daily.   VIMPAT 100 MG Tabs Generic drug:  Lacosamide Take 200 mg by mouth 2 (two) times daily.   vitamin B-12 1000 MCG tablet Commonly known as:  CYANOCOBALAMIN Take 1,000 mcg by mouth daily.       Disposition: Home  Discharge Instructions    Diet - low sodium heart healthy   Complete by:  As directed    Increase activity slowly   Complete by:  As directed      Follow-up Information    [facility] Follow up.   Specialty:  Cardiology Why:  07/18/18 @ 9:00AM, wound check visit Contact information: [address] [place] [phone]       Constance Haw, MD Follow up.   Specialty:  Cardiology Why:  you Santiago Graf be called by Dr. Macky Lower scheduler to make a routine 3 month appointment Contact information: [address] [place] [address] [phone]           Duration of Discharge Encounter: Greater than 30 minutes including physician time.  Venetia Night, PA-C 07/03/2018 7:42 AM   I have seen and examined this patient with Tommye Standard.   Agree with above, note added to reflect my findings.  On exam, RRR, no murmurs, lungs clear.  Dual-chamber pacemaker implanted for sick sinus syndrome.  Chest x-ray and interrogation without issues.  Plan for discharge today with follow-up in device clinic.  Mckynzi Cammon M. Lynell Kussman MD 07/03/2018 8:19 AM

## 2018-07-02 NOTE — H&P (Signed)
Jeff Weaver has presented today for surgery, with the diagnosis of junctional bradycardia.  The various methods of treatment have been discussed with the patient and family. After consideration of risks, benefits and other options for treatment, the patient has consented to  Procedure(s): Pacemaker implant as a surgical intervention .  Risks include but not limited to bleeding, tamponade, infection, pneumothorax, among others. The patient's history has been reviewed, patient examined, no change in status, stable for surgery.  I have reviewed the patient's chart and labs.  Questions were answered to the patient's satisfaction.    Hairo Garraway Curt Bears, MD 07/02/2018 7:44 AM

## 2018-07-03 ENCOUNTER — Encounter (HOSPITAL_COMMUNITY): Payer: Self-pay | Admitting: Cardiology

## 2018-07-03 ENCOUNTER — Ambulatory Visit: Payer: Self-pay | Admitting: *Deleted

## 2018-07-03 ENCOUNTER — Ambulatory Visit (HOSPITAL_COMMUNITY): Payer: Medicare Other

## 2018-07-03 DIAGNOSIS — I493 Ventricular premature depolarization: Secondary | ICD-10-CM | POA: Diagnosis not present

## 2018-07-03 DIAGNOSIS — R569 Unspecified convulsions: Secondary | ICD-10-CM | POA: Diagnosis not present

## 2018-07-03 DIAGNOSIS — M069 Rheumatoid arthritis, unspecified: Secondary | ICD-10-CM | POA: Diagnosis not present

## 2018-07-03 DIAGNOSIS — I061 Rheumatic aortic insufficiency: Secondary | ICD-10-CM | POA: Diagnosis not present

## 2018-07-03 DIAGNOSIS — Z87891 Personal history of nicotine dependence: Secondary | ICD-10-CM | POA: Diagnosis not present

## 2018-07-03 DIAGNOSIS — G4730 Sleep apnea, unspecified: Secondary | ICD-10-CM | POA: Diagnosis not present

## 2018-07-03 DIAGNOSIS — E785 Hyperlipidemia, unspecified: Secondary | ICD-10-CM | POA: Diagnosis not present

## 2018-07-03 DIAGNOSIS — Z950 Presence of cardiac pacemaker: Secondary | ICD-10-CM | POA: Diagnosis not present

## 2018-07-03 DIAGNOSIS — I5032 Chronic diastolic (congestive) heart failure: Secondary | ICD-10-CM | POA: Diagnosis not present

## 2018-07-03 DIAGNOSIS — R7303 Prediabetes: Secondary | ICD-10-CM | POA: Diagnosis not present

## 2018-07-03 DIAGNOSIS — Z7982 Long term (current) use of aspirin: Secondary | ICD-10-CM | POA: Diagnosis not present

## 2018-07-03 DIAGNOSIS — I110 Hypertensive heart disease with heart failure: Secondary | ICD-10-CM | POA: Diagnosis not present

## 2018-07-03 DIAGNOSIS — I495 Sick sinus syndrome: Secondary | ICD-10-CM | POA: Diagnosis not present

## 2018-07-03 LAB — GLUCOSE, CAPILLARY: Glucose-Capillary: 102 mg/dL — ABNORMAL HIGH (ref 70–99)

## 2018-07-03 MED FILL — Midazolam HCl Inj 5 MG/5ML (Base Equivalent): INTRAMUSCULAR | Qty: 5 | Status: AC

## 2018-07-03 MED FILL — Fentanyl Citrate Preservative Free (PF) Inj 100 MCG/2ML: INTRAMUSCULAR | Qty: 2 | Status: AC

## 2018-07-03 NOTE — Telephone Encounter (Signed)
Summary: cold symptoms    Pt daughter called and stated that pt is sneezing , has a sore throat, and cough. Pt had pacemaker put in yesterday. Pt also has seizures. Pt daughter would like to know what she needs to do. Please advise      Patient had pacemaker placed yesterday- and he was released from the hospital today. There were no complications and patient was able to take all medications before and during hospitalization. Patient was released from hospital today- patient has started having symptoms- sneezing, sore throat and cough. Oral temperature is 98.5. Patient is prone to seizures.  Patient's wife is concerned about infection so soon after surgery and she does not want him to have seizure. She has antibiotics on hand and wants to know if she should start them- Amoxicillin.  Call to doctor on call-( Dr Anitra Lauth) would not recommend antibiotic at this time- watch for temperature 101, respiratory symptoms or mental alterations. Please call back if he has change is status- or take to UC/ED. Appointment for Thursday follow up to check throat scheduled.  Reason for Disposition . Nursing judgment or information in reference    Call to : on call PCP- concern from wife relayed and PCP states no antibiotic needed at this time- perimeters given.  Protocols used: NO GUIDELINE AVAILABLE-A-AH

## 2018-07-05 ENCOUNTER — Ambulatory Visit: Payer: Medicare Other | Admitting: Family Medicine

## 2018-07-05 ENCOUNTER — Encounter: Payer: Self-pay | Admitting: Physician Assistant

## 2018-07-05 ENCOUNTER — Telehealth: Payer: Self-pay

## 2018-07-05 ENCOUNTER — Telehealth: Payer: Self-pay | Admitting: Physician Assistant

## 2018-07-05 ENCOUNTER — Ambulatory Visit (INDEPENDENT_AMBULATORY_CARE_PROVIDER_SITE_OTHER): Payer: Medicare Other | Admitting: Physician Assistant

## 2018-07-05 VITALS — BP 102/64 | HR 71 | Ht 68.0 in | Wt 264.8 lb

## 2018-07-05 DIAGNOSIS — I495 Sick sinus syndrome: Secondary | ICD-10-CM | POA: Diagnosis not present

## 2018-07-05 DIAGNOSIS — R0600 Dyspnea, unspecified: Secondary | ICD-10-CM

## 2018-07-05 DIAGNOSIS — I493 Ventricular premature depolarization: Secondary | ICD-10-CM

## 2018-07-05 DIAGNOSIS — R0609 Other forms of dyspnea: Secondary | ICD-10-CM

## 2018-07-05 DIAGNOSIS — I5032 Chronic diastolic (congestive) heart failure: Secondary | ICD-10-CM | POA: Diagnosis not present

## 2018-07-05 DIAGNOSIS — I351 Nonrheumatic aortic (valve) insufficiency: Secondary | ICD-10-CM | POA: Diagnosis not present

## 2018-07-05 MED ORDER — ROSUVASTATIN CALCIUM 5 MG PO TABS: ORAL_TABLET | ORAL | 2 refills | Status: DC

## 2018-07-05 MED FILL — ROSUVASTATIN CALCIUM 5 MG T: 5 | 45 days supply | Qty: 45 | Fill #0

## 2018-07-05 NOTE — Telephone Encounter (Signed)
Pt is on TCM List dc'ed 07/03/2018 after admission for cardiac device insitu.  Pt to follow up with Cards.

## 2018-07-05 NOTE — Telephone Encounter (Signed)
New message:     He needs the pt maxi um daily dose of his Crestor please.

## 2018-07-05 NOTE — Telephone Encounter (Signed)
Called Dylan the pharmacist back. Informed him that [name] PA ordered for patient to take 1 tablet 3 times a week and increase as tolerated. Informed him that the most the patient should be taking of crestor is 5 mg by mouth daily if tolerating. Dylan thanked me for the call back.

## 2018-07-05 NOTE — Patient Instructions (Addendum)
Medication Instructions:  Your physician has recommended you make the following change in your medication:  1.  STOP the Losartan.  If your bp goes back to 130 on top, restart it back at 1 tablet daily  If you need a refill on your cardiac medications before your next appointment, please call your pharmacy.   Lab work: None ordered  If you have labs (blood work) drawn today and your tests are completely normal, you will receive your results only by: Marland Kitchen MyChart Message (if you have MyChart) OR . A paper copy in the mail If you have any lab test that is abnormal or we need to change your treatment, we will call you to review the results.  Testing/Procedures: None ordered  Follow-Up: Your physician recommends that you schedule a follow-up appointment in: [place]    Any Other Special Instructions Will Be Listed Below (If Applicable).    DASH Eating Plan DASH stands for "Dietary Approaches to Stop Hypertension." The DASH eating plan is a healthy eating plan that has been shown to reduce high blood pressure (hypertension). It may also reduce your risk for type 2 diabetes, heart disease, and stroke. The DASH eating plan may also help with weight loss. What are tips for following this plan?  General guidelines  Avoid eating more than 2,300 mg (milligrams) of salt (sodium) a day. If you have hypertension, you may need to reduce your sodium intake to 1,500 mg a day.  Limit alcohol intake to no more than 1 drink a day for nonpregnant women and 2 drinks a day for men. One drink equals 12 oz of beer, 5 oz of wine, or 1 oz of hard liquor.  Work with your health care provider to maintain a healthy body weight or to lose weight. Ask what an ideal weight is for you.  Get at least 30 minutes of exercise that causes your heart to beat faster (aerobic exercise) most days of the week. Activities may include walking, swimming, or biking.  Work with your health care provider or diet  and nutrition specialist (dietitian) to adjust your eating plan to your individual calorie needs. Reading food labels   Check food labels for the amount of sodium per serving. Choose foods with less than 5 percent of the Daily Value of sodium. Generally, foods with less than 300 mg of sodium per serving fit into this eating plan.  To find whole grains, look for the word "whole" as the first word in the ingredient list. Shopping  Buy products labeled as "low-sodium" or "no salt added."  Buy fresh foods. Avoid canned foods and premade or frozen meals. Cooking  Avoid adding salt when cooking. Use salt-free seasonings or herbs instead of table salt or sea salt. Check with your health care provider or pharmacist before using salt substitutes.  Do not fry foods. Cook foods using healthy methods such as baking, boiling, grilling, and broiling instead.  Cook with heart-healthy oils, such as olive, canola, soybean, or sunflower oil. Meal planning  Eat a balanced diet that includes: ? 5 or more servings of fruits and vegetables each day. At each meal, try to fill half of your plate with fruits and vegetables. ? Up to 6-8 servings of whole grains each day. ? Less than 6 oz of lean meat, poultry, or fish each day. A 3-oz serving of meat is about the same size as a deck of cards. One egg equals 1 oz. ? 2 servings of low-fat  dairy each day. ? A serving of nuts, seeds, or beans 5 times each week. ? Heart-healthy fats. Healthy fats called Omega-3 fatty acids are found in foods such as flaxseeds and coldwater fish, like sardines, salmon, and mackerel.  Limit how much you eat of the following: ? Canned or prepackaged foods. ? Food that is high in trans fat, such as fried foods. ? Food that is high in saturated fat, such as fatty meat. ? Sweets, desserts, sugary drinks, and other foods with added sugar. ? Full-fat dairy products.  Do not salt foods before eating.  Try to eat at least 2 vegetarian  meals each week.  Eat more home-cooked food and less restaurant, buffet, and fast food.  When eating at a restaurant, ask that your food be prepared with less salt or no salt, if possible. What foods are recommended? The items listed may not be a complete list. Talk with your dietitian about what dietary choices are best for you. Grains Whole-grain or whole-wheat bread. Whole-grain or whole-wheat pasta. Brown rice. [place] Morrow. Bulgur. Whole-grain and low-sodium cereals. Pita bread. Low-fat, low-sodium crackers. Whole-wheat flour tortillas. Vegetables Fresh or frozen vegetables (raw, steamed, roasted, or grilled). Low-sodium or reduced-sodium tomato and vegetable juice. Low-sodium or reduced-sodium tomato sauce and tomato paste. Low-sodium or reduced-sodium canned vegetables. Fruits All fresh, dried, or frozen fruit. Canned fruit in natural juice (without added sugar). Meat and other protein foods Skinless chicken or [place]. Ground chicken or [place]. Pork with fat trimmed off. Fish and seafood. Egg whites. Dried beans, peas, or lentils. Unsalted nuts, nut butters, and seeds. Unsalted canned beans. Lean cuts of beef with fat trimmed off. Low-sodium, lean deli meat. Dairy Low-fat (1%) or fat-free (skim) milk. Fat-free, low-fat, or reduced-fat cheeses. Nonfat, low-sodium ricotta or cottage cheese. Low-fat or nonfat yogurt. Low-fat, low-sodium cheese. Fats and oils Soft margarine without trans fats. Vegetable oil. Low-fat, reduced-fat, or light mayonnaise and salad dressings (reduced-sodium). Canola, safflower, olive, soybean, and sunflower oils. Avocado. Seasoning and other foods Herbs. Spices. Seasoning mixes without salt. Unsalted popcorn and pretzels. Fat-free sweets. What foods are not recommended? The items listed may not be a complete list. Talk with your dietitian about what dietary choices are best for you. Grains Baked goods made with fat, such as croissants, muffins, or some  breads. Dry pasta or rice meal packs. Vegetables Creamed or fried vegetables. Vegetables in a cheese sauce. Regular canned vegetables (not low-sodium or reduced-sodium). Regular canned tomato sauce and paste (not low-sodium or reduced-sodium). Regular tomato and vegetable juice (not low-sodium or reduced-sodium). Angie Fava. Olives. Fruits Canned fruit in a light or heavy syrup. Fried fruit. Fruit in cream or butter sauce. Meat and other protein foods Fatty cuts of meat. Ribs. Fried meat. Berniece Salines. Sausage. Bologna and other processed lunch meats. Salami. Fatback. Hotdogs. Bratwurst. Salted nuts and seeds. Canned beans with added salt. Canned or smoked fish. Whole eggs or egg yolks. Chicken or [place] with skin. Dairy Whole or 2% milk, cream, and half-and-half. Whole or full-fat cream cheese. Whole-fat or sweetened yogurt. Full-fat cheese. Nondairy creamers. Whipped toppings. Processed cheese and cheese spreads. Fats and oils Butter. Stick margarine. Lard. Shortening. Ghee. Bacon fat. Tropical oils, such as coconut, palm kernel, or palm oil. Seasoning and other foods Salted popcorn and pretzels. Onion salt, garlic salt, seasoned salt, table salt, and sea salt. Worcestershire sauce. Tartar sauce. Barbecue sauce. Teriyaki sauce. Soy sauce, including reduced-sodium. Steak sauce. Canned and packaged gravies. Fish sauce. Oyster sauce. Cocktail sauce. Horseradish that you find  on the shelf. Ketchup. Mustard. Meat flavorings and tenderizers. Bouillon cubes. Hot sauce and Tabasco sauce. Premade or packaged marinades. Premade or packaged taco seasonings. Relishes. Regular salad dressings. Where to find more information:  National Heart, Lung, and [place]: https://wilson-eaton.com/  American Heart Association: www.heart.org Summary  The DASH eating plan is a healthy eating plan that has been shown to reduce high blood pressure (hypertension). It may also reduce your risk for type 2 diabetes, heart disease, and  stroke.  With the DASH eating plan, you should limit salt (sodium) intake to 2,300 mg a day. If you have hypertension, you may need to reduce your sodium intake to 1,500 mg a day.  When on the DASH eating plan, aim to eat more fresh fruits and vegetables, whole grains, lean proteins, low-fat dairy, and heart-healthy fats.  Work with your health care provider or diet and nutrition specialist (dietitian) to adjust your eating plan to your individual calorie needs. This information is not intended to replace advice given to you by your health care provider. Make sure you discuss any questions you have with your health care provider. Document Released: 06/09/2011 Document Revised: 06/13/2016 Document Reviewed: 06/13/2016 Elsevier Interactive Patient Education  2019 [employer].

## 2018-07-09 ENCOUNTER — Telehealth: Payer: Self-pay | Admitting: Cardiology

## 2018-07-09 NOTE — Telephone Encounter (Signed)
New message  Patient's wife is calling to see when he can take a shower since he has a pacemaker put in on 07/02/18. Patient's wife also needs to know if another would check needs to be done before 07/18/18 she states that she has 07/12/2018 written down. Please advise.

## 2018-07-09 NOTE — Telephone Encounter (Signed)
Spoke w/ pt wife and informed her that pt can take a shower as of today. She is aware that patient should pat the area dry. She is aware of wound check appt on 07-18-2018.

## 2018-07-18 ENCOUNTER — Ambulatory Visit (INDEPENDENT_AMBULATORY_CARE_PROVIDER_SITE_OTHER): Payer: Medicare Other | Admitting: Nurse Practitioner

## 2018-07-18 DIAGNOSIS — I441 Atrioventricular block, second degree: Secondary | ICD-10-CM

## 2018-07-18 LAB — CUP PACEART INCLINIC DEVICE CHECK
Date Time Interrogation Session: 20200115092520
Implantable Lead Implant Date: 20191230
Implantable Lead Implant Date: 20191230
Implantable Lead Location: 753859
Implantable Lead Location: 753860
Implantable Lead Model: 5076
Implantable Lead Model: 5076
Implantable Pulse Generator Implant Date: 20191230

## 2018-07-18 NOTE — Progress Notes (Signed)
Wound check appointment. Steri-strips removed by patient. Wound without redness or edema. Incision edges approximated, wound well healed. Normal device function. Thresholds, sensing, and impedances consistent with implant measurements. Device programmed at 3.5V/auto capture programmed on for extra safety margin until 3 month visit. Histogram distribution appropriate for patient and level of activity. No mode switches or high ventricular rates noted. Patient educated about wound care, arm mobility, lifting restrictions. ROV in 3 months with implanting physician. Pt feels significantly improved post PPM implant.

## 2018-07-18 NOTE — Patient Instructions (Addendum)
Medication Instructions:  none If you need a refill on your cardiac medications before your next appointment, please call your pharmacy.   Lab work: none If you have labs (blood work) drawn today and your tests are completely normal, you will receive your results only by: Marland Kitchen MyChart Message (if you have MyChart) OR . A paper copy in the mail If you have any lab test that is abnormal or we need to change your treatment, we will call you to review the results.  Testing/Procedures: none  Follow-Up: At [facility], you and your health needs are our priority.  As part of our continuing mission to provide you with exceptional heart care, we have created designated Provider Care Teams.  These Care Teams include your primary Cardiologist (physician) and Advanced Practice Providers (APPs -  Physician Assistants and Nurse Practitioners) who all work together to provide you with the care you need, when you need it. You will need a follow up appointment in 91 days from IMPLANT.  Please call our office 2 months in advance to schedule this appointment.  You may see Will Meredith Leeds, MD or one of the following Advanced Practice Providers on your designated Care Team:   Chanetta Marshall, NP . Tommye Standard, PA-C  Any Other Special Instructions Will Be Listed Below (If Applicable).

## 2018-07-31 ENCOUNTER — Ambulatory Visit: Payer: Medicare Other | Admitting: Family Medicine

## 2018-07-31 IMAGING — US US EXTREM  UP VENOUS*R*
1 series · 14 of 24 positions shown · non-contrast
Comparison: None.

CLINICAL DATA: 70-year-old male with right arm pain, redness and
swelling. Multiple recent right arm IV sticks.



[Series 1: us extrem up venous*right* · 0.10mm/px · 14 of 33 slices shown]
[im 1/33]
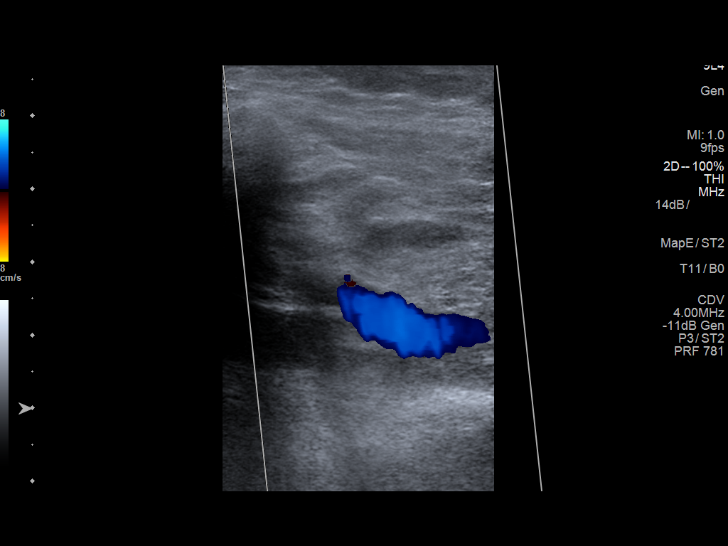
[im 3/33]
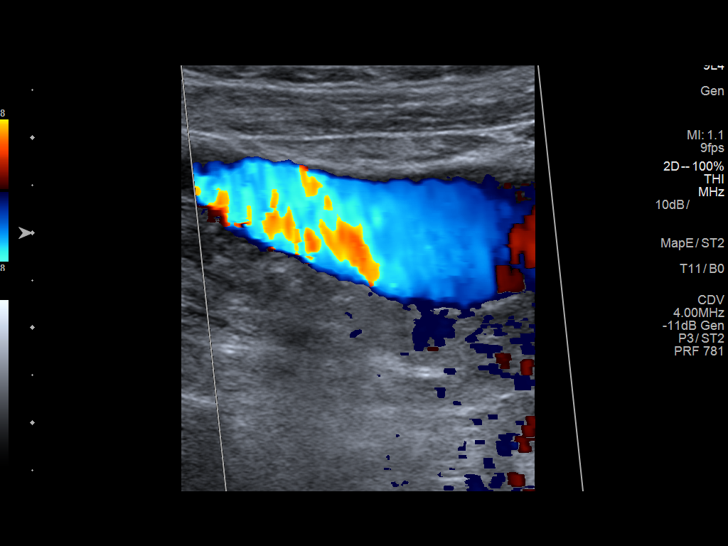
[im 6/33]
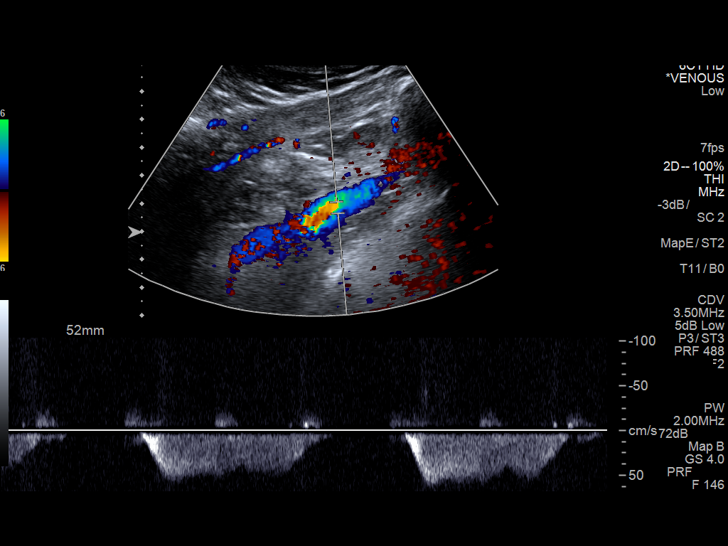
[im 9/33]
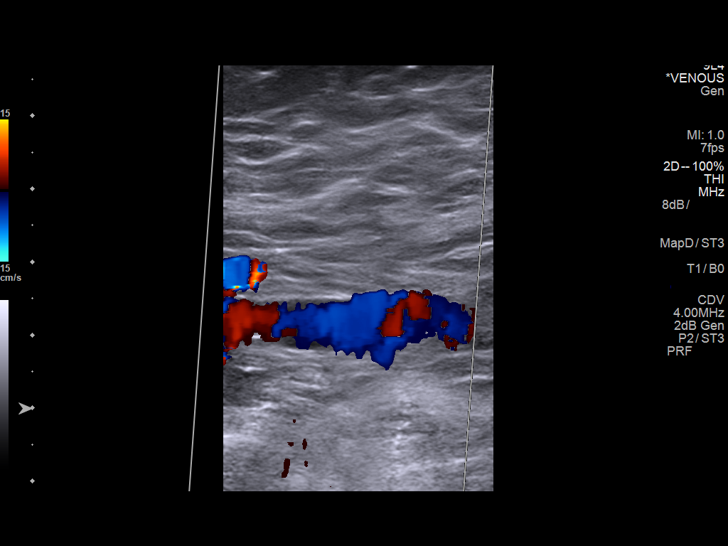
[im 10/33]
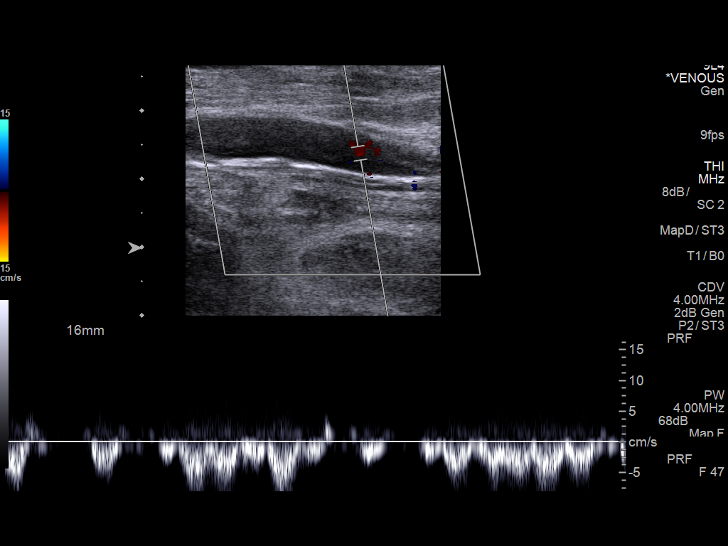
[im 13/33]
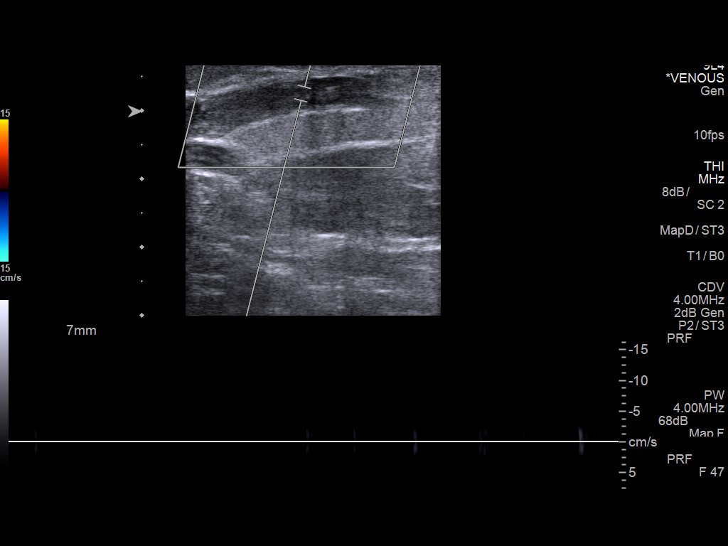
[im 16/33]
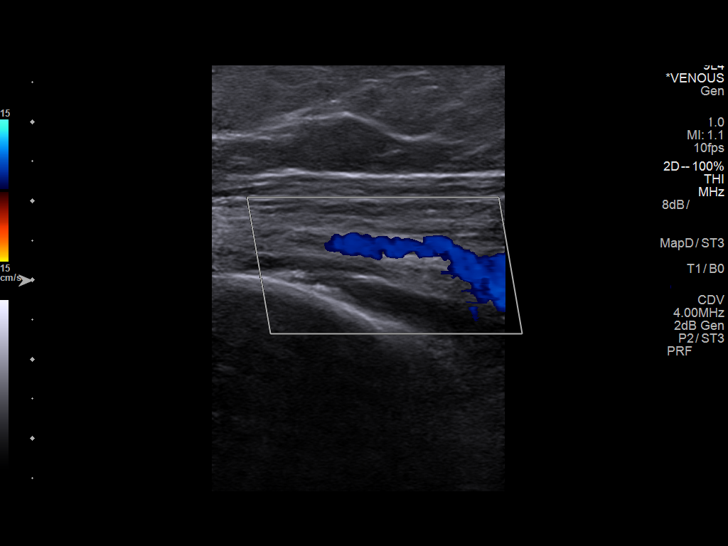
[im 17/33]
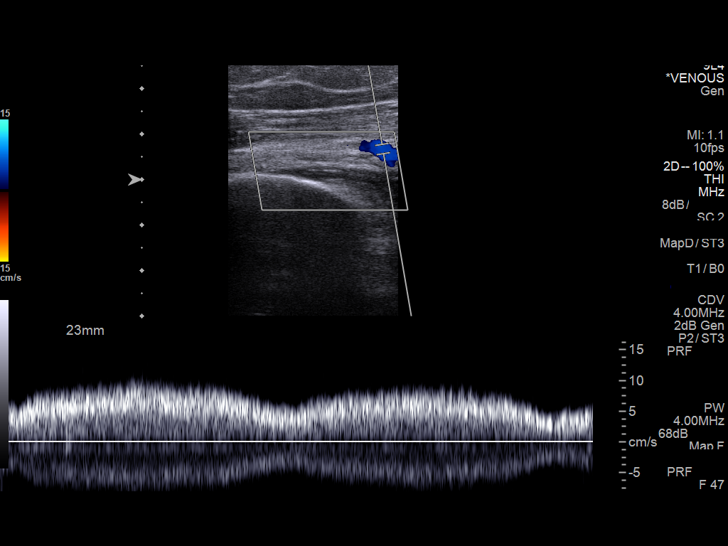
[im 20/33]
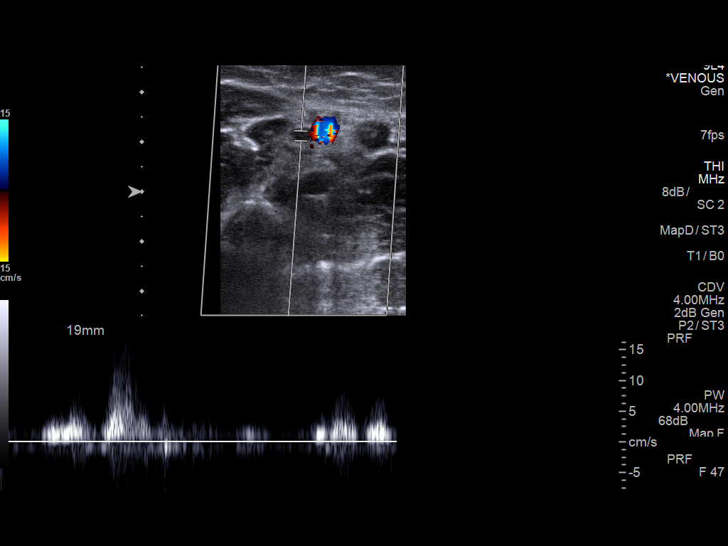
[im 23/33]
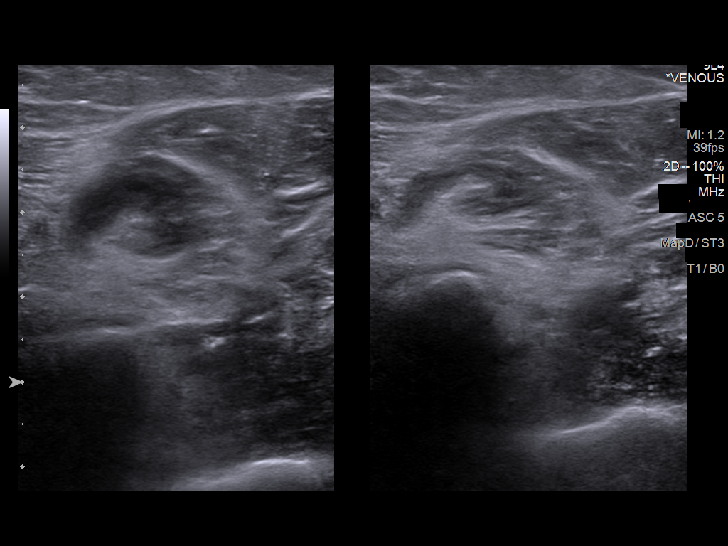
[im 26/33]
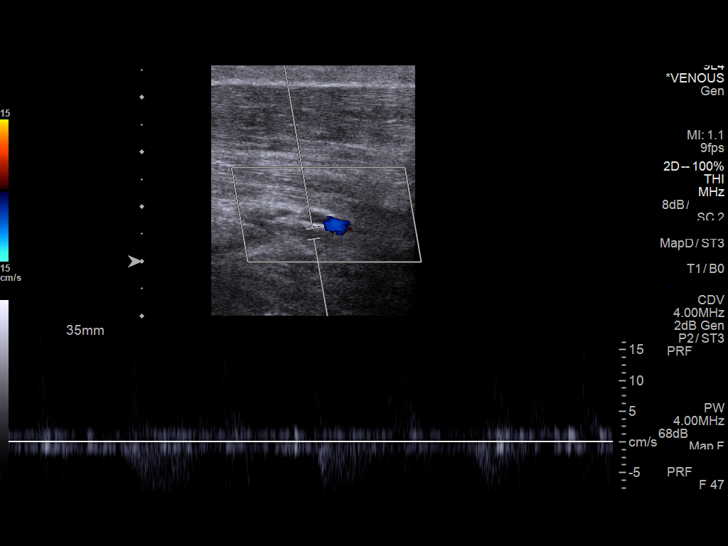
[im 27/33]
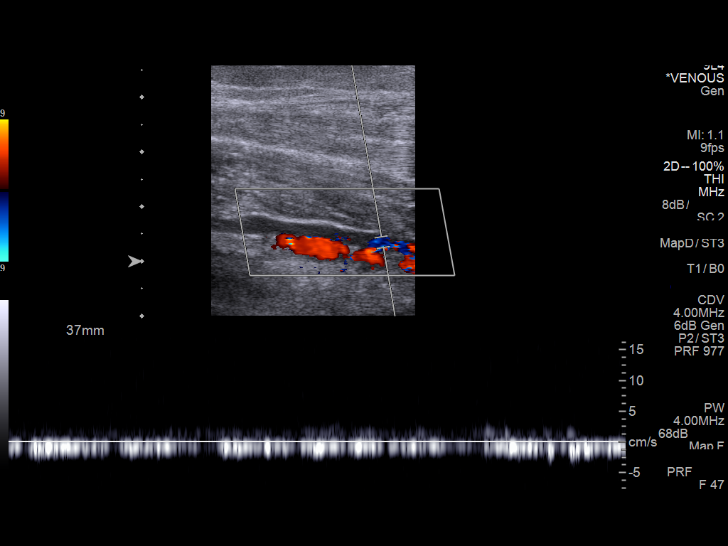
[im 30/33]
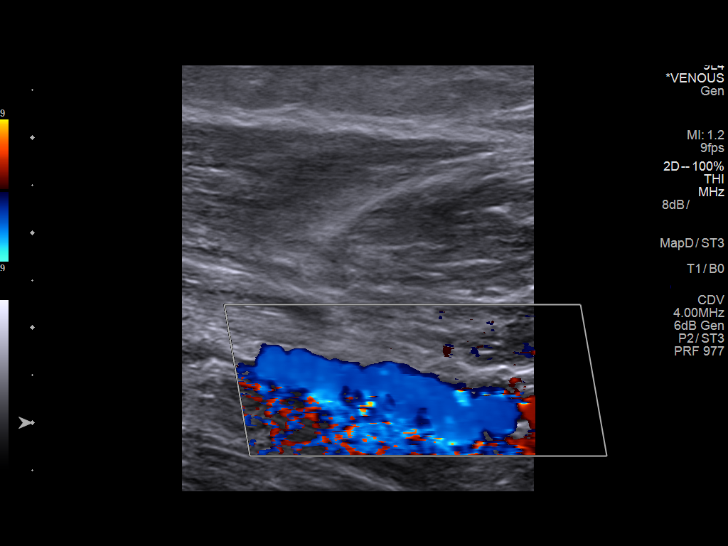
[im 33/33]
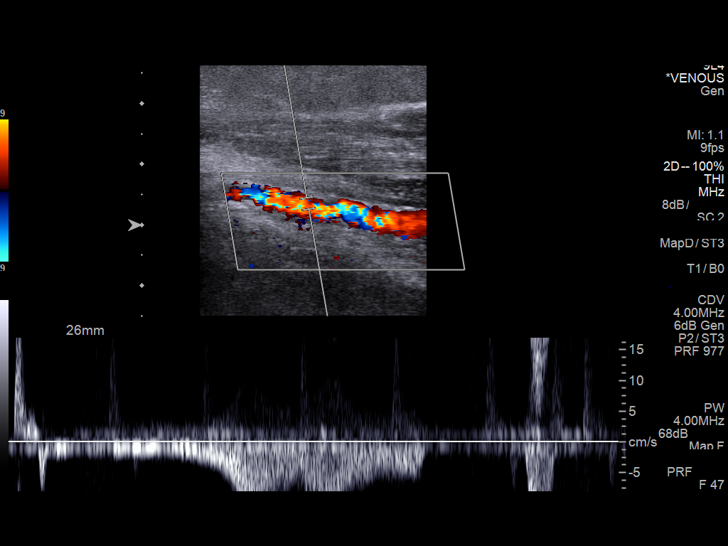

[14 of 24 positions shown; findings below may reference images not displayed]

FINDINGS: Occlusive thrombus within the basilic vein noted.

There is no evidence of DVT within the left subclavian, internal
jugular, subclavian, axillary, brachials, radial or ulnar veins.

The cephalic vein is patent without thrombus.
IMPRESSION: Occlusive superficial venous thrombosis of the basilic vein.

No evidence of thrombus within the deep venous system of the right
upper extremity.

Results were called by telephone at the time of interpretation on
01/03/2017 at [DATE] to Dr. WELLIGNTON MONDARDO , who verbally
acknowledged these results.

## 2018-08-22 ENCOUNTER — Encounter: Payer: Self-pay | Admitting: Internal Medicine

## 2018-08-27 DIAGNOSIS — Z961 Presence of intraocular lens: Secondary | ICD-10-CM | POA: Diagnosis not present

## 2018-08-27 DIAGNOSIS — H524 Presbyopia: Secondary | ICD-10-CM | POA: Diagnosis not present

## 2018-08-28 MED FILL — AMOXICILLIN 500 MG CAPSULE: 500 | 2 days supply | Qty: 8 | Fill #0

## 2018-08-31 ENCOUNTER — Other Ambulatory Visit: Payer: Self-pay | Admitting: *Deleted

## 2018-08-31 MED ORDER — NITROGLYCERIN 0.4 MG SL SUBL: 0.4000 mg | SUBLINGUAL_TABLET | SUBLINGUAL | 3 refills | Status: DC | PRN

## 2018-08-31 MED FILL — NITROGLYCERIN 0.4 MG TAB SL: 0.4 | 7 days supply | Qty: 25 | Fill #0

## 2018-08-31 MED FILL — FUROSEMIDE 40 MG TAB: 40 | 90 days supply | Qty: 90 | Fill #1

## 2018-09-04 ENCOUNTER — Encounter: Payer: Medicare Other | Admitting: Internal Medicine

## 2018-09-04 ENCOUNTER — Encounter: Payer: Medicare Other | Admitting: Physician Assistant

## 2018-09-14 NOTE — Telephone Encounter (Signed)
As of 09-14-2018 Cologuard has not been completed.

## 2018-09-29 MED FILL — ROSUVASTATIN CALCIUM 5 MG T: 5 | 45 days supply | Qty: 45 | Fill #1

## 2018-10-01 ENCOUNTER — Telehealth: Payer: Self-pay | Admitting: *Deleted

## 2018-10-01 NOTE — Telephone Encounter (Signed)
Called patient to let them know due to recent [place] and Health Department Protocols, we are not seeing patients in the office on Tuesday in [place]. We are instead seeing if they would like to schedule this appointment as a Research scientist (medical) or Laptop. Patient is aware if they decide to reschedule this appointment, they may not be seen or scheduled for the next 4-6 months.  Spoke with both daughter and wife about WebEx. The daughter will try to get WebEx set up for her father do do Virtual visits.  Wife or daughter will call back if they are having any issues with loading WebEx on his computer.

## 2018-10-02 ENCOUNTER — Other Ambulatory Visit: Payer: Self-pay

## 2018-10-02 ENCOUNTER — Encounter: Payer: Self-pay | Admitting: Cardiology

## 2018-10-02 ENCOUNTER — Telehealth (INDEPENDENT_AMBULATORY_CARE_PROVIDER_SITE_OTHER): Payer: Medicare Other | Admitting: Cardiology

## 2018-10-02 VITALS — BP 111/74 | HR 61 | Wt 256.5 lb

## 2018-10-02 DIAGNOSIS — G4733 Obstructive sleep apnea (adult) (pediatric): Secondary | ICD-10-CM | POA: Diagnosis not present

## 2018-10-02 DIAGNOSIS — G4730 Sleep apnea, unspecified: Secondary | ICD-10-CM

## 2018-10-02 DIAGNOSIS — I5032 Chronic diastolic (congestive) heart failure: Secondary | ICD-10-CM | POA: Diagnosis not present

## 2018-10-02 DIAGNOSIS — R001 Bradycardia, unspecified: Secondary | ICD-10-CM | POA: Diagnosis not present

## 2018-10-02 DIAGNOSIS — I110 Hypertensive heart disease with heart failure: Secondary | ICD-10-CM | POA: Diagnosis not present

## 2018-10-02 NOTE — Patient Instructions (Signed)
Medication Instructions:  Your physician recommends that you continue on your current medications as directed. Please refer to the Current Medication list given to you today.  *If you need a refill on your cardiac medications before your next appointment, please call your pharmacy*  Labwork: None ordered  Testing/Procedures: None ordered  Follow-Up: There office will refer you to establish with Dr. Radford Pax, for your sleep apnea.  We will arrange this once COVID-19 pandemic is over.  Remote monitoring is used to monitor your Pacemaker or ICD from home. This monitoring reduces the number of office visits required to check your device to one time per year. It allows [place] to keep an eye on the functioning of your device to ensure it is working properly. You are scheduled for a device check from home on - THE OFFICE WILL ARRANGE YOUR NEXT DATE AND LET YOU KNOW. You may send your transmission at any time that day. If you have a wireless device, the transmission will be sent automatically. After your physician reviews your transmission, you will receive a postcard with your next transmission date.  The office will contact you to arrange 3 month office visit with Dr. Curt Bears.  Thank you for choosing [facility]!!   Trinidad Curet, RN [phone]

## 2018-10-02 NOTE — Progress Notes (Signed)
Electrophysiology TeleHealth Note   Due to national recommendations of social distancing due to COVID 19, an audio/video telehealth visit is felt to be most appropriate for this patient at this time.  See MyChart message from today for the patient's consent to telehealth for [facility]   Date:  10/02/2018   ID:  ELYJAH HAZAN, DOB 1946/03/23, MRN [id number]  Location: patient's home  Provider location: [address] [place]  Evaluation Performed: Follow-up visit  PCP:  [name], MD  Cardiologist:  Ena Dawley, MD  Electrophysiologist:  Dr Curt Bears  Chief Complaint:  Junctional bradycardia  History of Present Illness:    POWELL HALBERT is a 73 y.o. male who presents via audio/video conferencing for a telehealth visit today.  Since last being seen in our clinic, the patient reports doing very well.  Today, he denies symptoms of palpitations, chest pain, shortness of breath,  lower extremity edema, dizziness, presyncope, or syncope.  The patient is otherwise without complaint today.  The patient denies symptoms of fevers, chills, cough, or new SOB worrisome for COVID 19.  Today, denies symptoms of palpitations, chest pain, shortness of breath, orthopnea, PND, lower extremity edema, claudication, dizziness, presyncope, syncope, bleeding, or neurologic sequela. The patient is tolerating medications without difficulties.  He has a history of diastolic heart failure, hypertension.  He presented to the hospital December 2019 with symptomatic bradycardia.  He subsequently had aMedtronic dual-chamber pacemaker implanted 07/02/2018.  Since that time, he has done well.  He has had no chest pain or shortness of breath.  He is able to do most of his daily activities.  He has not been walking quite as much as he used to, as unfortunately his walking partner has passed away.  He does have some fatigue, but he has untreated sleep apnea.  Past Medical History:  Diagnosis Date   . Aortic insufficiency    a. mild 2017 echo.  . Cerebral aneurysm    a. s/p clipping at [facility] in 8/13 c/b short-term memory loss, cerebral hemorrhage and seizure disorder s/p VP shunt.  . Cerebral hemorrhage ([place])   . Chronic diastolic CHF (congestive heart failure) ()   . CVA (cerebral infarction)   . Diverticulosis   . Enteritis (regional)    Dr Olevia Perches  . First degree AV block   . GI bleed   . HTN (hypertension)   . Hyperlipidemia   . Hypertensive heart disease   . IBS (irritable bowel syndrome)   . Mild CAD    Cardiac cath 01/12/18 showed minimal nonobstructive CAD, normal LVEF, normal LVEDP.  Marland Kitchen NSVT (nonsustained ventricular tachycardia) ([place])   . Orthostatic hypotension   . Pre-diabetes   . PVC's (premature ventricular contractions)   . 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 Rheumatoid arthritis(714.0)    dxed in [employer] in 1980s  . Second degree AV block, Mobitz type I   . Seizures ([place])   . Sinus bradycardia   . Sleep apnea   . SSS (sick sinus syndrome) ([place])    a. s/p PPM 07/02/18.    Past Surgical History:  Procedure Laterality Date  . CHOLECYSTECTOMY N/A 01/07/2016   Procedure: LAPAROSCOPIC CHOLECYSTECTOMY ;  Surgeon: Coralie Keens, MD;  Location: [facility];  Service: General;  Laterality: N/A;  . cns shunt  02/23/12  . COLONOSCOPY W/ POLYPECTOMY  1978   negative 2009, Dr Olevia Perches. Due 2014  . CRANIOTOMY  02/02/12   Dr Harvel Ricks, [place]  of aneurysm  . 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 HERNIA REPAIR    . LEFT HEART CATH AND CORONARY ANGIOGRAPHY N/A 01/12/2018   Procedure: LEFT HEART CATH AND CORONARY ANGIOGRAPHY;  Surgeon: Martinique, Peter M, MD;  Location: [facility] CV LAB;  Service: Cardiovascular;  Laterality: N/A;  . LEFT HEART CATHETERIZATION WITH CORONARY ANGIOGRAM N/A 11/26/2013   Procedure: LEFT HEART CATHETERIZATION WITH CORONARY ANGIOGRAM;  Surgeon: [name], MD;  Location: [facility] CATH LAB;  Service: Cardiovascular;  Laterality: N/A;  . NOSE SURGERY    . PACEMAKER IMPLANT N/A 07/02/2018   Procedure: PACEMAKER IMPLANT;  Surgeon: Constance Haw, MD;  Location: [place] CV LAB;  Service: Cardiovascular;  Laterality: N/A;  . SHOULDER SURGERY  1997  . TONSILLECTOMY AND ADENOIDECTOMY    . VENTRICULOPERITONEAL SHUNT  02-23-12   INSERTION OF RIGHT FRONTAL VENTRICULOPERITONEAL SHUNT WITH A CODMAN HAKIM PROGRAMMABLE VALVE    Current Outpatient Medications  Medication Sig Dispense Refill  . acetaminophen (TYLENOL) 325 MG tablet Take 325-650 mg by mouth every 6 (six) hours as needed for headache.    Marland Kitchen amoxicillin (AMOXIL) 500 MG capsule Take 2,000 mg by mouth as directed. Take 2000 mg by mouth 1 hour prior to dental appointments    . aspirin EC 81 MG tablet Take 1 tablet (81 mg total) by mouth daily. 90 tablet 3  . blood glucose meter kit and supplies KIT Dispense based on patient and insurance preference. Use up to four times daily as directed. (prediabetes hypoglycemia). 1 each 0  . cetirizine (ZYRTEC) 10 MG tablet Take 1 tablet (10 mg total) by mouth daily. 30 tablet 11  . cholecalciferol (VITAMIN D) 1000 units tablet Take 1,000 Units by mouth daily.    . fluticasone (FLONASE) 50 MCG/ACT nasal spray Place 1-2 sprays into both nostrils daily.    . furosemide (LASIX) 40 MG tablet Take 1 tablet (40 mg total) by mouth daily. 90 tablet 1  . levETIRAcetam (KEPPRA) 500 MG tablet Take 1,000 mg by mouth 2 (two) times daily.     Marland Kitchen LORazepam (ATIVAN) 1 MG tablet Take 1 mg by mouth every 6 (six) hours as needed for seizure.     . mirabegron ER (MYRBETRIQ) 25 MG TB24 tablet Take 25 mg by mouth every evening.     . nitroGLYCERIN (NITROSTAT) 0.4 MG SL tablet Place 1 tablet (0.4 mg total) under the tongue every 5 (five) minutes as needed for chest pain. 25 tablet 3  . NON FORMULARY CPAP Machine: authorized by [place] in [place]    . pyridOXINE (VITAMIN B-6) 100 MG  tablet Take 100 mg by mouth daily.    . rosuvastatin (CRESTOR) 5 MG tablet Take 1 tablet three times week and increase as tolerated 45 tablet 2  . Tamsulosin HCl (FLOMAX) 0.4 MG CAPS Take 0.4 mg by mouth every evening.     . thiamine 100 MG tablet Take 100 mg by mouth daily.    Marland Kitchen VIMPAT 100 MG TABS Take 200 mg by mouth 2 (two) times daily.    . vitamin B-12 (CYANOCOBALAMIN) 1000 MCG tablet Take 1,000 mcg by mouth daily.     No current facility-administered medications for this visit.     Allergies:   Haloperidol; Rosuvastatin; Simvastatin; Lisinopril; Nifedipine; and Pravastatin   Social History:  The patient  reports that he quit smoking about 10 years ago. His smoking use included pipe. He smoked 0.00  packs per day for 30.00 years. He has quit using smokeless tobacco. He reports current alcohol use. He reports that he does not use drugs.   Family History:  The patient's  family history includes COPD in his father; Coronary artery disease in his father; Dementia (age of onset: 30) in his sister; Diabetes in his brother and sister; Hepatitis in his mother.   ROS:  Please see the history of present illness.   All other systems are personally reviewed and negative.    Exam:    Vital Signs:  BP 111/74   Pulse 61   Wt 256 lb 8 oz (116.3 kg)   BMI 39.00 kg/m   Well appearing, alert and conversant, regular work of breathing,  good skin color Eyes- anicteric, neuro- grossly intact, skin- no apparent rash or lesions or cyanosis, mouth- oral mucosa is pink   Labs/Other Tests and Data Reviewed:    Recent Labs: 02/01/2018: TSH 1.390 04/18/2018: ALT 32 06/07/2018: NT-Pro BNP 480 07/02/2018: BUN 15; Creatinine, Ser 1.06; Hemoglobin 14.7; Platelets 163; Potassium 4.3; Sodium 136   Wt Readings from Last 3 Encounters:  10/02/18 256 lb 8 oz (116.3 kg)  07/05/18 264 lb 12.8 oz (120.1 kg)  07/03/18 260 lb 1.6 oz (118 kg)     Other studies personally reviewed: Additional studies/ records that  were reviewed today include: ECG 07/03/18  Review of the above records today demonstrates: atrial paced, 1dAVB  Last device interrogation is reviewed from [place] PDF dated 07/18/18 which reveals normal device function, no arrhythmias    ASSESSMENT & PLAN:    1.  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  Device functioning appropriately on most recent device check.  We Dellamae Rosamilia continue to check remote interrogations.  2.  Chronic diastolic heart failure: No obvious signs of volume overload.  3.  Hypertension: Currently well controlled.  4.  Obstructive sleep apnea: Is currently being [facility] at the [facility].  At this point he would like to switch to our practice.  Blaiden Werth refer internally.  COVID 19 screen The patient denies symptoms of COVID 19 at this time.  The importance of social distancing was discussed today.  Follow-up: 3 months  Current medicines are reviewed at length with the patient today.   The patient does not have concerns regarding his medicines.  The following changes were made today:  none  Labs/ tests ordered today include:  No orders of the defined types were placed in this encounter.    Patient Risk:  after full review of this patients clinical status, I feel that they are at moderate risk at this time.  Today, I have spent 15 minutes with the patient with telehealth technology discussing Obstructive sleep apnea, pacemaker .    Signed, Keygan Dumond Meredith Leeds, MD  10/02/2018 11:53 AM     [address] [place] [phone] (office) [phone] (fax)

## 2018-10-03 NOTE — Addendum Note (Signed)
Addended by: Stanton Kidney on: 10/03/2018 09:46 AM   Modules accepted: Orders

## 2018-10-11 ENCOUNTER — Telehealth: Payer: Self-pay | Admitting: *Deleted

## 2018-10-11 NOTE — Telephone Encounter (Addendum)
Reached out to the patient and per dpr spoke to his wife to get the number of the [place] in [place] ext [phone]. Chart has requested his sleep study last office note and last download. [facility] by [facility].  We are now contacting [facility] for the sleep study he did there at fax [phone] where he sees Alan Mulder a neurologist.

## 2018-10-11 NOTE — Telephone Encounter (Signed)
-----   Message from Lauralee Evener, [place] sent at 10/09/2018 12:57 PM EDT ----- Regarding: RE: to establish OSA f/u w/ [facility]  ----- Message ----- From: Stanton Kidney, RN Sent: 10/03/2018   9:46 AM EDT To: Stanton Kidney, RN, Cv Div Sleep Studies Subject: to establish OSA f/u w/ [facility]                 Pt currenlty having his OSA followed by the [place].  He would like to have care transferred to [facility].  He understands office will contact him to establish once COVID-19 pandemic is over. (referral order in [place])

## 2018-10-12 ENCOUNTER — Telehealth: Payer: Self-pay | Admitting: Cardiology

## 2018-10-12 NOTE — Telephone Encounter (Signed)
New Message   Pt is calling to speak with Gae Bon    Please call

## 2018-10-12 NOTE — Telephone Encounter (Signed)
Message routed to [place].

## 2018-10-16 NOTE — Telephone Encounter (Addendum)
Returned call [facility]mtcb. Sleep study received but no download was included will speak to the patient about how we will get the download.

## 2018-10-16 NOTE — Telephone Encounter (Signed)
Sleep study received but no download was included will speak to the patient about how we will get the download.

## 2018-10-17 NOTE — Telephone Encounter (Signed)
Retuned call and per dpr spoke to wife about getting a download.

## 2018-10-17 NOTE — Telephone Encounter (Signed)
Patient's wife returned your call

## 2018-10-30 ENCOUNTER — Telehealth: Payer: Self-pay | Admitting: *Deleted

## 2018-10-30 ENCOUNTER — Encounter: Payer: Self-pay | Admitting: *Deleted

## 2018-10-30 NOTE — Telephone Encounter (Signed)
Virtual Visit Pre-Appointment Phone Call  "(Name), I am calling you today to discuss your upcoming appointment. We are currently trying to limit exposure to the virus that causes COVID-19 by seeing patients at home rather than in the office."  1. "What is the BEST phone number to call the day of the visit?" - include this in appointment notes-THIS WAS PLACED IN NOTES-WIFE WOULD LIKE TO SEE IF DR [place] DOXYME, B/C PT HARD OF HEARING-IF NOT [place]  2. Do you have or have access to (through a family member/friend) a smartphone with video capability that we can use for your visit?" a. If yes - list this number in appt notes as cell (if different from BEST phone #) and list the appointment type as a VIDEO visit in appointment notes-PT WILL DO VIDEO VISIT AND WE WILL [place] IF THAT DOESN'T WORK   Confirm consent - "In the setting of the current Covid19 crisis, you are scheduled for a ( video) visit with Dr [name] on 11/14/18 at 10 am.  Just as we do with many in-office visits, in order for you to participate in this visit, we must obtain consent.  If you'd like, I can send this to your mychart (if signed up) or email for you to review.  Otherwise, I can obtain your verbal consent now.  All virtual visits are billed to your insurance company just like a normal visit would be.  By agreeing to a virtual visit, we'd like you to understand that the technology does not allow for your provider to perform an examination, and thus may limit your provider's ability to fully assess your condition. If your provider identifies any concerns that need to be evaluated in person, we will make arrangements to do so.  Finally, though the technology is pretty good, we cannot assure that it will always work on either your or our end, and in the setting of a video visit, we may have to convert it to a phone-only visit.  In  either situation, we cannot ensure that we have a secure connection.  Are you willing to proceed?" STAFF: Did the patient verbally acknowledge consent to telehealth visit?YES PT AND WIFE GAVE VERBAL CONSENT AS WELL AS THEY WILL READ CONSENT TO TREAT THROUGH HIS MYCHART ACCOUNT.  3. Advise patient to be prepared - "Two hours prior to your appointment, go ahead and check your blood pressure, pulse, oxygen saturation, and your weight (if you have the equipment to check those) and write them all down. When your visit starts, your provider will ask you for this information. If you have an Apple Watch or Kardia device, please plan to have heart rate information ready on the day of your appointment. Please have a pen and paper handy nearby the day of the visit as well." YES  4. Give patient instructions for  smartphone USE WITH [email or web address].me as below if video visit (depending on what platform provider is using)  5. Inform patient they will receive a phone call 15 minutes prior to their appointment time (may be from unknown caller ID) so they should be prepared to answer    TELEPHONE CALL NOTE  Sarvesh D Cotten has been deemed a candidate for a follow-up tele-health visit to limit community exposure during the Covid-19 pandemic. I spoke with the patient via phone to ensure availability of phone/video source, confirm preferred email & phone number, and discuss  instructions and expectations.  I reminded ANTUAN LIMES to be prepared with any vital sign and/or heart rhythm information that could potentially be obtained via home monitoring, at the time of his visit. I reminded SUJAY GRUNDMAN to expect a phone call prior to his visit.  Nuala Alpha, LPN 08/25/9796 92:11 AM    IF USING  [email or web address] - The patient will receive a link just prior to their visit by text. YES PT AWARE     FULL LENGTH CONSENT FOR TELE-HEALTH VISIT   I hereby voluntarily request, consent and authorize [facility] and its employed or contracted  physicians, physician assistants, nurse practitioners or other licensed health care professionals (the Practitioner), to provide me with telemedicine health care services (the Services") as deemed necessary by the treating Practitioner. I acknowledge and consent to receive the Services by the Practitioner via telemedicine. I understand that the telemedicine visit will involve communicating with the Practitioner through live audiovisual communication technology and the disclosure of certain medical information by electronic transmission. I acknowledge that I have been given the opportunity to request an in-person assessment or other available alternative prior to the telemedicine visit and am voluntarily participating in the telemedicine visit.  I understand that I have the right to withhold or withdraw my consent to the use of telemedicine in the course of my care at any time, without affecting my right to future care or treatment, and that the Practitioner or I may terminate the telemedicine visit at any time. I understand that I have the right to inspect all information obtained and/or recorded in the course of the telemedicine visit and may receive copies of available information for a reasonable fee.  I understand that some of the potential risks of receiving the Services via telemedicine include:   Delay or interruption in medical evaluation due to technological equipment failure or disruption;  Information transmitted may not be sufficient (e.g. poor resolution of images) to allow for appropriate medical decision making by the Practitioner; and/or   In rare instances, security protocols could fail, causing a breach of personal health information.  Furthermore, I acknowledge that it is my responsibility to provide information about my medical history, conditions and care that is complete and accurate to the best of my ability. I acknowledge that Practitioner's advice, recommendations, and/or decision  may be based on factors not within their control, such as incomplete or inaccurate data provided by me or distortions of diagnostic images or specimens that may result from electronic transmissions. I understand that the practice of medicine is not an exact science and that Practitioner makes no warranties or guarantees regarding treatment outcomes. I acknowledge that I will receive a copy of this consent concurrently upon execution via email to the email address I last provided but may also request a printed copy by calling the office of [place].    I understand that my insurance will be billed for this visit.   I have read or had this consent read to me.  I understand the contents of this consent, which adequately explains the benefits and risks of the Services being provided via telemedicine.   I have been provided ample opportunity to ask questions regarding this consent and the Services and have had my questions answered to my satisfaction.  I give my informed consent for the services to be provided through the use of telemedicine in my medical care  By participating in this telemedicine visit I agree to the above.  PT AND  WIFE GAVE VERBAL CONSENT TO TREAT AS WELL AS THEY WILL REVIEW CONSENT TO TREAT MESSAGE IN PTS ACTIVE MYCHART.

## 2018-11-08 DIAGNOSIS — R41841 Cognitive communication deficit: Secondary | ICD-10-CM | POA: Diagnosis not present

## 2018-11-08 DIAGNOSIS — Z79899 Other long term (current) drug therapy: Secondary | ICD-10-CM | POA: Diagnosis not present

## 2018-11-08 DIAGNOSIS — Z8679 Personal history of other diseases of the circulatory system: Secondary | ICD-10-CM | POA: Diagnosis not present

## 2018-11-08 DIAGNOSIS — Z87891 Personal history of nicotine dependence: Secondary | ICD-10-CM | POA: Diagnosis not present

## 2018-11-08 DIAGNOSIS — I10 Essential (primary) hypertension: Secondary | ICD-10-CM | POA: Diagnosis not present

## 2018-11-08 DIAGNOSIS — G4733 Obstructive sleep apnea (adult) (pediatric): Secondary | ICD-10-CM | POA: Diagnosis not present

## 2018-11-08 DIAGNOSIS — Z982 Presence of cerebrospinal fluid drainage device: Secondary | ICD-10-CM | POA: Diagnosis not present

## 2018-11-08 DIAGNOSIS — E785 Hyperlipidemia, unspecified: Secondary | ICD-10-CM | POA: Diagnosis not present

## 2018-11-08 DIAGNOSIS — G40109 Localization-related (focal) (partial) symptomatic epilepsy and epileptic syndromes with simple partial seizures, not intractable, without status epilepticus: Secondary | ICD-10-CM | POA: Diagnosis not present

## 2018-11-08 DIAGNOSIS — Z95810 Presence of automatic (implantable) cardiac defibrillator: Secondary | ICD-10-CM | POA: Diagnosis not present

## 2018-11-08 DIAGNOSIS — R454 Irritability and anger: Secondary | ICD-10-CM | POA: Diagnosis not present

## 2018-11-08 DIAGNOSIS — G40209 Localization-related (focal) (partial) symptomatic epilepsy and epileptic syndromes with complex partial seizures, not intractable, without status epilepticus: Secondary | ICD-10-CM | POA: Diagnosis not present

## 2018-11-08 DIAGNOSIS — R413 Other amnesia: Secondary | ICD-10-CM | POA: Diagnosis not present

## 2018-11-13 MED FILL — CHLORHEXIDINE 0.12% RINSE: 0.12 | 16 days supply | Qty: 473 | Fill #1

## 2018-11-14 ENCOUNTER — Encounter: Payer: Self-pay | Admitting: *Deleted

## 2018-11-14 ENCOUNTER — Other Ambulatory Visit: Payer: Self-pay

## 2018-11-14 ENCOUNTER — Encounter: Payer: Self-pay | Admitting: Cardiology

## 2018-11-14 ENCOUNTER — Telehealth (INDEPENDENT_AMBULATORY_CARE_PROVIDER_SITE_OTHER): Payer: Medicare Other | Admitting: Cardiology

## 2018-11-14 VITALS — BP 123/87 | HR 62 | Ht 68.0 in | Wt 256.0 lb

## 2018-11-14 DIAGNOSIS — E785 Hyperlipidemia, unspecified: Secondary | ICD-10-CM

## 2018-11-14 DIAGNOSIS — I25118 Atherosclerotic heart disease of native coronary artery with other forms of angina pectoris: Secondary | ICD-10-CM

## 2018-11-14 DIAGNOSIS — I5033 Acute on chronic diastolic (congestive) heart failure: Secondary | ICD-10-CM

## 2018-11-14 DIAGNOSIS — I10 Essential (primary) hypertension: Secondary | ICD-10-CM

## 2018-11-14 DIAGNOSIS — I5032 Chronic diastolic (congestive) heart failure: Secondary | ICD-10-CM

## 2018-11-14 DIAGNOSIS — I951 Orthostatic hypotension: Secondary | ICD-10-CM

## 2018-11-14 NOTE — Patient Instructions (Addendum)
Medication Instructions:   Your physician recommends that you continue on your current medications as directed. Please refer to the Current Medication list given to you today.  If you need a refill on your cardiac medications before your next appointment, please call your pharmacy.    Lab work:  YOUR LAB [place] Monday March 18, 2019 AT OUR OFFICE TO CHECK--CMET, LIPIDS, AND PRO-BNP--PLEASE COME FASTING TO THIS LAB APPOINTMENT  If you have labs (blood work) drawn today and your tests are completely normal, you will receive your results only by: Marland Kitchen MyChart Message (if you have MyChart) OR . A paper copy in the mail If you have any lab test that is abnormal or we need to change your treatment, we will call you to review the results.    Follow-Up:  IN 4 MONTHS WITH DR [name] ON Friday March 22, 2019 AT 10:20 AM

## 2018-11-14 NOTE — Progress Notes (Signed)
Virtual Visit via Telephone Note   This visit type was conducted due to national recommendations for restrictions regarding the COVID-19 Pandemic (e.g. social distancing) in an effort to limit this patient's exposure and mitigate transmission in our community.  Due to his co-morbid illnesses, this patient is at least at moderate risk for complications without adequate follow up.  This format is felt to be most appropriate for this patient at this time.  The patient did not have access to video technology/had technical difficulties with video requiring transitioning to audio format only (telephone).  All issues noted in this document were discussed and addressed.  No physical exam could be performed with this format.  Please refer to the patient's chart for his  consent to telehealth for [facility].   Date:  11/14/2018   ID:  Jeff Weaver, DOB 1945-10-03, MRN [id number]  Patient Location: Home Provider Location: Home  PCP:  [name], MD  Cardiologist:  Ena Dawley, MD  Electrophysiologist:  Constance Haw, MD   Evaluation Performed:  Follow-Up Visit  Chief Complaint:  LE edema  History of Present Illness:    Jeff Weaver is a 73 y.o. 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  In 2014 evaluated for chest pain with normal nuclear stress test. He was admitted 11/2013 for chest pain and elevated troponin (0.74) -> LHC 11/26/13 revealed angiographically normal coronary arteries, mild mid LAD myocardial bridging, moderate to severely elevated LVEDP with systemic hypertension and preserved LVEF. Last echo 2D echo in 01/2016 showed mod LVH,  EF 60-65%, grade 2 DD, no RWMA, mild AI. More recently he was seen in the office 01/03/18 with worsening dyspnea on exertion. Cardiac cath 01/12/18 showed minimal nonobstructive CAD, normal LVEF, normal LVEDP. Holter 02/2018 showed profound bradycardia with HR down to 30 even during waking hours, he underwent a PM placement. In 06/2018 lasix added for worsening LE edema with PND. Labs had shown BNP 480. Otherwise f/u labs 07/02/18 showed Hgb 14.7, K 4.3, Cr 1.06, 04/2018 LDL 64, LFTs wnl, 02/2018 TSH wnl. He underwent PPM implantation 07/02/18 with Medtronic Azure XT DR MRI SureScan dual-chamber pacemaker. Seen by [name] in 07/2018.  11/14/2018 - the patient is doing well, he denies any CP, SOB, he has mild residual LE edema at the end of days. Mild orthostatic hypotension with no falls. No palpitations, no discharge from the PM insertion site.  Compliant with meds.  The patient does not have symptoms concerning for COVID-19 infection (fever, chills, cough, or new shortness of breath).    Past Medical History:  Diagnosis Date  . Aortic insufficiency    a. mild 2017 echo.  . Cerebral aneurysm    a. s/p clipping at [facility] in 8/13 c/b short-term memory loss, cerebral hemorrhage and seizure disorder s/p VP shunt.  . Cerebral hemorrhage ([place])   . Chronic diastolic CHF (congestive heart failure) ([place])   . CVA (cerebral infarction)   . Diverticulosis   . Enteritis (regional)    Dr Olevia Perches  . First degree AV block   . GI bleed   . HTN (hypertension)   . Hyperlipidemia   . Hypertensive heart disease   .  IBS (irritable bowel syndrome)   . Mild CAD    Cardiac cath 01/12/18 showed minimal nonobstructive CAD, normal LVEF, normal LVEDP.  Marland Kitchen NSVT (nonsustained ventricular tachycardia) ([place])   . Orthostatic hypotension   . Pre-diabetes   . PVC's (premature ventricular contractions)   . 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 Rheumatoid arthritis(714.0)    dxed in [employer] in 1980s  .  Second degree AV block, Mobitz type I   . Seizures ([place])   . Sinus bradycardia   . Sleep apnea   . SSS (sick sinus syndrome) ([place])    a. s/p PPM 07/02/18.   Past Surgical History:  Procedure Laterality Date  . CHOLECYSTECTOMY N/A 01/07/2016   Procedure: LAPAROSCOPIC CHOLECYSTECTOMY ;  Surgeon: Coralie Keens, MD;  Location: [place];  Service: General;  Laterality: N/A;  . cns shunt  02/23/12  . COLONOSCOPY W/ POLYPECTOMY  1978   negative 2009, Dr Olevia Perches. Due 2014  . CRANIOTOMY  02/02/12   Dr Harvel Ricks, [place] of aneurysm  . 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 HERNIA REPAIR    . LEFT HEART CATH AND CORONARY ANGIOGRAPHY N/A 01/12/2018   Procedure: LEFT HEART CATH AND CORONARY ANGIOGRAPHY;  Surgeon: Martinique, Peter M, MD;  Location: [place] CV LAB;  Service: Cardiovascular;  Laterality: N/A;  . LEFT HEART CATHETERIZATION WITH CORONARY ANGIOGRAM N/A 11/26/2013   Procedure: LEFT HEART CATHETERIZATION WITH CORONARY ANGIOGRAM;  Surgeon: [name], MD;  Location: [facility] CATH LAB;  Service: Cardiovascular;  Laterality: N/A;  . NOSE SURGERY    . PACEMAKER IMPLANT N/A 07/02/2018   Procedure: PACEMAKER IMPLANT;  Surgeon: Constance Haw, MD;  Location: [place] CV LAB;  Service: Cardiovascular;  Laterality: N/A;  . SHOULDER SURGERY  1997  . TONSILLECTOMY AND ADENOIDECTOMY    . VENTRICULOPERITONEAL SHUNT  02-23-12   INSERTION OF RIGHT FRONTAL VENTRICULOPERITONEAL SHUNT WITH A CODMAN HAKIM PROGRAMMABLE VALVE     Current Meds  Medication Sig  . acetaminophen (TYLENOL) 325 MG tablet Take 325-650 mg by mouth every 6 (six) hours as needed for headache.  Marland Kitchen amoxicillin (AMOXIL) 500 MG capsule Take 2,000 mg by mouth as directed. Take 2000 mg by mouth 1 hour prior to dental appointments  . aspirin EC 81 MG tablet Take 1 tablet (81 mg total) by mouth daily.  . blood glucose meter kit and supplies KIT Dispense based on  patient and insurance preference. Use up to four times daily as directed. (prediabetes hypoglycemia).  . cetirizine (ZYRTEC) 10 MG tablet Take 1 tablet (10 mg total) by mouth daily.  . chlorhexidine (PERIDEX) 0.12 % solution   . cholecalciferol (VITAMIN D) 1000 units tablet Take 1,000 Units by mouth daily.  . fluticasone (FLONASE) 50 MCG/ACT nasal spray Place 1-2 sprays into both nostrils daily.  . furosemide (LASIX) 40 MG tablet Take 1 tablet (40 mg total) by mouth daily.  Marland Kitchen levETIRAcetam (KEPPRA) 500 MG tablet Take 1,000 mg by mouth 2 (two) times daily.   Marland Kitchen LORazepam (ATIVAN) 1 MG tablet Take 1 mg by mouth every 6 (six) hours as needed for seizure.   . mirabegron ER (MYRBETRIQ) 25 MG TB24 tablet Take 25 mg by mouth every evening.   . nitroGLYCERIN (NITROSTAT) 0.4 MG SL tablet Place 1 tablet (0.4 mg total) under the tongue every 5 (five) minutes as needed for chest pain.  . NON FORMULARY CPAP Machine: authorized by [place] in [place]  .  pyridOXINE (VITAMIN B-6) 100 MG tablet Take 100 mg by mouth daily.  . rosuvastatin (CRESTOR) 5 MG tablet Take 1 tablet three times week and increase as tolerated  . Tamsulosin HCl (FLOMAX) 0.4 MG CAPS Take 0.4 mg by mouth every evening.   . thiamine 100 MG tablet Take 100 mg by mouth daily.  Marland Kitchen VIMPAT 100 MG TABS Take 200 mg by mouth 2 (two) times daily.  . vitamin B-12 (CYANOCOBALAMIN) 1000 MCG tablet Take 1,000 mcg by mouth daily.     Allergies:   Haloperidol; Rosuvastatin; Simvastatin; Lisinopril; Nifedipine; and Pravastatin   Social History   Tobacco Use  . Smoking status: Former Smoker    Packs/day: 0.00    Years: 30.00    Pack years: 0.00    Types: Pipe    Last attempt to quit: 07/04/2008    Years since quitting: 10.3  . Smokeless tobacco: Former Network engineer Use Topics  . Alcohol use: Yes    Alcohol/week: 0.0 standard drinks    Comment: Very Infrequently   . Drug use: No     Family Hx: The patient's family history includes COPD in his father;  Coronary artery disease in his father; Dementia (age of onset: 30) in his sister; Diabetes in his brother and sister; Hepatitis in his mother. There is no history of Colon cancer.  ROS:   Please see the history of present illness.    All other systems reviewed and are negative.   Prior CV studies:   The following studies were reviewed today:   Labs/Other Tests and Data Reviewed:    EKG:  No ECG reviewed.  Recent Labs: 02/01/2018: TSH 1.390 04/18/2018: ALT 32 06/07/2018: NT-Pro BNP 480 07/02/2018: BUN 15; Creatinine, Ser 1.06; Hemoglobin 14.7; Platelets 163; Potassium 4.3; Sodium 136   Recent Lipid Panel Lab Results  Component Value Date/Time   CHOL 131 04/18/2018 10:23 AM   CHOL 128 09/08/2017 12:02 PM   CHOL 159 03/27/2013 08:18 AM   TRIG 131.0 04/18/2018 10:23 AM   TRIG 127 03/27/2013 08:18 AM   HDL 40.70 04/18/2018 10:23 AM   HDL 47 09/08/2017 12:02 PM   HDL 45 03/27/2013 08:18 AM   CHOLHDL 3 04/18/2018 10:23 AM   LDLCALC 64 04/18/2018 10:23 AM   LDLCALC 56 09/08/2017 12:02 PM   LDLCALC 89 03/27/2013 08:18 AM   LDLDIRECT 87.6 09/19/2012 12:06 PM   Wt Readings from Last 3 Encounters:  11/14/18 256 lb (116.1 kg)  10/02/18 256 lb 8 oz (116.3 kg)  07/05/18 264 lb 12.8 oz (120.1 kg)    Objective:    Vital Signs:  BP 123/87   Pulse 62   Ht _0  (1.727 m)   Wt 256 lb (116.1 kg)   BMI 38.92 kg/m    VITAL SIGNS:  reviewed   ASSESSMENT & PLAN:    1. Acute on chronic diastolic CHF 1. With mild residual LE edema, continue lasix 40 mg po daily 2. We discussed low sodium diet in details, also necessity of 5x/week physical activity 3. Repeat BNP and BMP prior to the next visit in 4 months 2. SSS s/p PPM - functioning well 3. PVCs/NSVT - can be followed by EP via pacemaker now to further assess burden. 4. Mild aortic insufficiency - stable  COVID-19 Education: The signs and symptoms of COVID-19 were discussed with the patient and how to seek care for testing (follow  up with PCP or arrange E-visit).  The importance of social distancing was discussed today.  Time:   Today, I have spent 25 minutes with the patient with telehealth technology discussing the above problems.     Medication Adjustments/Labs and Tests Ordered: Current medicines are reviewed at length with the patient today.  Concerns regarding medicines are outlined above.   Tests Ordered: No orders of the defined types were placed in this encounter.   Medication Changes: No orders of the defined types were placed in this encounter.   Disposition:  Follow up in 4 month(s)  Signed, Ena Dawley, MD  11/14/2018 10:05 AM    [place]

## 2018-11-23 MED FILL — AMOXICILLIN 500 MG CAPSULE: 500 | 6 days supply | Qty: 25 | Fill #0

## 2018-11-27 ENCOUNTER — Other Ambulatory Visit: Payer: Self-pay | Admitting: Cardiology

## 2018-11-27 DIAGNOSIS — I5032 Chronic diastolic (congestive) heart failure: Secondary | ICD-10-CM

## 2018-11-27 DIAGNOSIS — I25118 Atherosclerotic heart disease of native coronary artery with other forms of angina pectoris: Secondary | ICD-10-CM

## 2018-11-27 MED FILL — FUROSEMIDE 40 MG TAB: 40 | 90 days supply | Qty: 90 | Fill #0

## 2018-12-03 MED FILL — AMOXICILLIN 500 MG CAPSULE: 500 | 2 days supply | Qty: 8 | Fill #1

## 2018-12-12 ENCOUNTER — Telehealth: Payer: Self-pay | Admitting: Cardiology

## 2018-12-12 NOTE — Telephone Encounter (Signed)
Returned call: Per dpr spoke to [place] and arranged to meet on Friday 6/12 to get a download on patients machine.

## 2018-12-12 NOTE — Telephone Encounter (Signed)
New Message     Pts wife is calling because she said her husband has a Cpap machine from the [place] and she says the device you plug into the side of the machine allows you to download it to see what is on the machine     Please call back

## 2018-12-26 ENCOUNTER — Telehealth: Payer: Self-pay | Admitting: Cardiology

## 2018-12-26 NOTE — Telephone Encounter (Signed)
New Message     Left pt voicemail to call back and confirm appt.   Consent has been given in Epic

## 2018-12-27 ENCOUNTER — Other Ambulatory Visit: Payer: Self-pay

## 2018-12-27 ENCOUNTER — Telehealth (INDEPENDENT_AMBULATORY_CARE_PROVIDER_SITE_OTHER): Payer: Medicare Other | Admitting: Cardiology

## 2018-12-27 DIAGNOSIS — I495 Sick sinus syndrome: Secondary | ICD-10-CM | POA: Diagnosis not present

## 2018-12-27 NOTE — Progress Notes (Signed)
Electrophysiology TeleHealth Note   Due to national recommendations of social distancing due to COVID 19, an audio/video telehealth visit is felt to be most appropriate for this patient at this time.  See Epic message for the patient's consent to telehealth for [facility].   Date:  12/27/2018   ID:  Jeff Weaver, DOB [birth year], MRN [id number]  Location: patient's home  Provider location: [address] [place]  Evaluation Performed: Follow-up visit  PCP:  [name], MD  Cardiologist:  Ena Dawley, MD  Electrophysiologist:  Dr Curt Bears  Chief Complaint:  pacemaker  History of Present Illness:    Jeff Weaver is a 73 y.o. male who presents via audio/video conferencing for a telehealth visit today.  Since last being seen in our clinic, the patient reports doing very well.  Today, he denies symptoms of palpitations, chest pain, shortness of breath,  lower extremity edema, dizziness, presyncope, or syncope.  The patient is otherwise without complaint today.  The patient denies symptoms of fevers, chills, cough, or new SOB worrisome for COVID 19.  He has a history of obesity, orthostasis, hyperlipidemia, diastolic heart failure, hypertension, VP shunt, sleep apnea, and mild AI.  He was noted to have sick sinus syndrome is now status post pacemaker implant.  Today, denies symptoms of palpitations, chest pain, shortness of breath, orthopnea, PND, lower extremity edema, claudication, dizziness, presyncope, syncope, bleeding, or neurologic sequela. The patient is tolerating medications without difficulties.  He has been doing well since last being seen.  No chest pain or shortness of breath.  Unfortunately his brother died suddenly 2 months ago.  His brother was 70.  Past Medical History:  Diagnosis Date  . Aortic insufficiency    a. mild 2017 echo.  . Cerebral aneurysm    a. s/p clipping at [facility] in 8/13 c/b short-term memory loss, cerebral hemorrhage and  seizure disorder s/p VP shunt.  . Cerebral hemorrhage ([place])   . Chronic diastolic CHF (congestive heart failure) ([place])   . CVA (cerebral infarction)   . Diverticulosis   . Enteritis (regional)    Dr Olevia Perches  . First degree AV block   . GI bleed   . HTN (hypertension)   . Hyperlipidemia   . Hypertensive heart disease   . IBS (irritable bowel syndrome)   . Mild CAD    Cardiac cath 01/12/18 showed minimal nonobstructive CAD, normal LVEF, normal LVEDP.  Marland Kitchen NSVT (nonsustained ventricular tachycardia) ([place])   . Orthostatic hypotension   . Pre-diabetes   . PVC's (premature ventricular contractions)   . 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 Rheumatoid arthritis(714.0)    dxed in [employer] in 1980s  . Second degree AV block, Mobitz type I   . Seizures ([place])   . Sinus bradycardia   . Sleep apnea   . SSS (sick sinus syndrome) ([place])    a. s/p PPM 07/02/18.    Past Surgical History:  Procedure Laterality Date  . CHOLECYSTECTOMY N/A 01/07/2016   Procedure: LAPAROSCOPIC CHOLECYSTECTOMY ;  Surgeon: Coralie Keens, MD;  Location: [place];  Service: General;  Laterality: N/A;  . cns shunt  02/23/12  . COLONOSCOPY W/ POLYPECTOMY  1978   negative 2009, Dr Olevia Perches. Due 2014  . CRANIOTOMY  02/02/12   Dr Harvel Ricks, [place] of aneurysm  . 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 HERNIA REPAIR    . LEFT  HEART CATH AND CORONARY ANGIOGRAPHY N/A 01/12/2018   Procedure: LEFT HEART CATH AND CORONARY ANGIOGRAPHY;  Surgeon: Martinique, Peter M, MD;  Location: [place] CV LAB;  Service: Cardiovascular;  Laterality: N/A;  . LEFT HEART CATHETERIZATION WITH CORONARY ANGIOGRAM N/A 11/26/2013   Procedure: LEFT HEART CATHETERIZATION WITH CORONARY ANGIOGRAM;  Surgeon: [name], MD;  Location: [facility] CATH LAB;  Service: Cardiovascular;  Laterality: N/A;  . NOSE SURGERY    . PACEMAKER IMPLANT N/A 07/02/2018   Procedure: PACEMAKER  IMPLANT;  Surgeon: Constance Haw, MD;  Location: [place] CV LAB;  Service: Cardiovascular;  Laterality: N/A;  . SHOULDER SURGERY  1997  . TONSILLECTOMY AND ADENOIDECTOMY    . VENTRICULOPERITONEAL SHUNT  02-23-12   INSERTION OF RIGHT FRONTAL VENTRICULOPERITONEAL SHUNT WITH A CODMAN HAKIM PROGRAMMABLE VALVE    Current Outpatient Medications  Medication Sig Dispense Refill  . acetaminophen (TYLENOL) 325 MG tablet Take 325-650 mg by mouth every 6 (six) hours as needed for headache.    Marland Kitchen amoxicillin (AMOXIL) 500 MG capsule Take 2,000 mg by mouth as directed. Take 2000 mg by mouth 1 hour prior to dental appointments    . aspirin EC 81 MG tablet Take 1 tablet (81 mg total) by mouth daily. 90 tablet 3  . blood glucose meter kit and supplies KIT Dispense based on patient and insurance preference. Use up to four times daily as directed. (prediabetes hypoglycemia). 1 each 0  . cetirizine (ZYRTEC) 10 MG tablet Take 1 tablet (10 mg total) by mouth daily. 30 tablet 11  . chlorhexidine (PERIDEX) 0.12 % solution     . cholecalciferol (VITAMIN D) 1000 units tablet Take 1,000 Units by mouth daily.    . fluticasone (FLONASE) 50 MCG/ACT nasal spray Place 1-2 sprays into both nostrils daily.    . furosemide (LASIX) 40 MG tablet TAKE 1 TABLET (40 MG TOTAL) BY MOUTH DAILY. 90 tablet 1  . levETIRAcetam (KEPPRA) 500 MG tablet Take 1,000 mg by mouth 2 (two) times daily.     Marland Kitchen LORazepam (ATIVAN) 1 MG tablet Take 1 mg by mouth every 6 (six) hours as needed for seizure.     . mirabegron ER (MYRBETRIQ) 25 MG TB24 tablet Take 25 mg by mouth every evening.     . nitroGLYCERIN (NITROSTAT) 0.4 MG SL tablet Place 1 tablet (0.4 mg total) under the tongue every 5 (five) minutes as needed for chest pain. 25 tablet 3  . NON FORMULARY CPAP Machine: authorized by [place] in [place]    . pyridOXINE (VITAMIN B-6) 100 MG tablet Take 100 mg by mouth daily.    . rosuvastatin (CRESTOR) 5 MG tablet Take 1 tablet three times week and  increase as tolerated 45 tablet 2  . Tamsulosin HCl (FLOMAX) 0.4 MG CAPS Take 0.4 mg by mouth every evening.     . thiamine 100 MG tablet Take 100 mg by mouth daily.    Marland Kitchen VIMPAT 100 MG TABS Take 200 mg by mouth 2 (two) times daily.    . vitamin B-12 (CYANOCOBALAMIN) 1000 MCG tablet Take 1,000 mcg by mouth daily.     No current facility-administered medications for this visit.     Allergies:   Haloperidol, Rosuvastatin, Simvastatin, Lisinopril, Nifedipine, and Pravastatin   Social History:  The patient  reports that he quit smoking about 10 years ago. His smoking use included pipe. He smoked 0.00 packs per day for 30.00 years. He has quit using smokeless tobacco. He reports current alcohol use. He reports  that he does not use drugs.   Family History:  The patient's  family history includes COPD in his father; Coronary artery disease in his father; Dementia (age of onset: 50) in his sister; Diabetes in his brother and sister; Hepatitis in his mother.   ROS:  Please see the history of present illness.   All other systems are personally reviewed and negative.    Exam:    Vital Signs:  BP 113/78   Pulse 60   Over the phone, no acute distress, no shortness of breath.  Labs/Other Tests and Data Reviewed:    Recent Labs: 02/01/2018: TSH 1.390 04/18/2018: ALT 32 06/07/2018: NT-Pro BNP 480 07/02/2018: BUN 15; Creatinine, Ser 1.06; Hemoglobin 14.7; Platelets 163; Potassium 4.3; Sodium 136   Wt Readings from Last 3 Encounters:  11/14/18 256 lb (116.1 kg)  10/02/18 256 lb 8 oz (116.3 kg)  07/05/18 264 lb 12.8 oz (120.1 kg)     Other studies personally reviewed: Additional studies/ records that were reviewed today include: ECG 07/03/2018 personally reviewed Review of the above records today demonstrates: Atrial paced, LVH    ASSESSMENT & PLAN:    1.  Junctional bradycardia: Status post pacemaker.  Pacemaker was functioning appropriately a wound check.  He has a remote interrogation  upcoming.  No changes.  2.  Chronic diastolic heart failure: No signs of volume overload.  No changes.  3.  Hypertension: Well-controlled.   COVID 19 screen The patient denies symptoms of COVID 19 at this time.  The importance of social distancing was discussed today.  Follow-up:  6 months Next remote: 01/02/19  Current medicines are reviewed at length with the patient today.   The patient does not have concerns regarding his medicines.  The following changes were made today:  none  Labs/ tests ordered today include:  No orders of the defined types were placed in this encounter.    Patient Risk:  after full review of this patients clinical status, I feel that they are at moderate risk at this time.  Today, I have spent 8 minutes with the patient with telehealth technology discussing pacemaker .    Signed, Tobie Hellen Meredith Leeds, MD  12/27/2018 11:25 AM     [facility] [address] [place] [address] [phone] (office) [phone] (fax)

## 2019-01-02 ENCOUNTER — Ambulatory Visit (INDEPENDENT_AMBULATORY_CARE_PROVIDER_SITE_OTHER): Payer: Medicare Other | Admitting: *Deleted

## 2019-01-02 DIAGNOSIS — I495 Sick sinus syndrome: Secondary | ICD-10-CM

## 2019-01-02 LAB — CUP PACEART REMOTE DEVICE CHECK
Battery Remaining Longevity: 131 mo
Battery Voltage: 3.07 V
Brady Statistic AP VP Percent: 79.1 %
Brady Statistic AP VS Percent: 4.23 %
Brady Statistic AS VP Percent: 7.86 %
Brady Statistic AS VS Percent: 8.8 %
Brady Statistic RA Percent Paced: 83.67 %
Brady Statistic RV Percent Paced: 86.96 %
Date Time Interrogation Session: 20200701020231
Implantable Lead Implant Date: 20191230
Implantable Lead Implant Date: 20191230
Implantable Lead Location: 753859
Implantable Lead Location: 753860
Implantable Lead Model: 5076
Implantable Lead Model: 5076
Implantable Pulse Generator Implant Date: 20191230
Lead Channel Impedance Value: 304 Ohm
Lead Channel Impedance Value: 361 Ohm
Lead Channel Impedance Value: 399 Ohm
Lead Channel Impedance Value: 551 Ohm
Lead Channel Pacing Threshold Amplitude: 0.625 V
Lead Channel Pacing Threshold Amplitude: 1 V
Lead Channel Pacing Threshold Pulse Width: 0.4 ms
Lead Channel Pacing Threshold Pulse Width: 0.4 ms
Lead Channel Sensing Intrinsic Amplitude: 12 mV
Lead Channel Sensing Intrinsic Amplitude: 12 mV
Lead Channel Sensing Intrinsic Amplitude: 2.5 mV
Lead Channel Sensing Intrinsic Amplitude: 2.5 mV
Lead Channel Setting Pacing Amplitude: 1.5 V
Lead Channel Setting Pacing Amplitude: 2.5 V
Lead Channel Setting Pacing Pulse Width: 0.4 ms
Lead Channel Setting Sensing Sensitivity: 1.2 mV

## 2019-01-07 DIAGNOSIS — L821 Other seborrheic keratosis: Secondary | ICD-10-CM | POA: Diagnosis not present

## 2019-01-07 DIAGNOSIS — L304 Erythema intertrigo: Secondary | ICD-10-CM | POA: Diagnosis not present

## 2019-01-07 DIAGNOSIS — L570 Actinic keratosis: Secondary | ICD-10-CM | POA: Diagnosis not present

## 2019-01-07 MED FILL — DESONIDE 0.05% OINTMENT: 0.05 | 30 days supply | Qty: 60 | Fill #0

## 2019-01-11 ENCOUNTER — Encounter: Payer: Self-pay | Admitting: Cardiology

## 2019-01-11 NOTE — Progress Notes (Signed)
Remote pacemaker transmission.   

## 2019-01-21 ENCOUNTER — Encounter: Payer: Self-pay | Admitting: Physician Assistant

## 2019-01-31 MED FILL — MYRBETRIQ ER 25 MG TABLET: 25 | 30 days supply | Qty: 30 | Fill #0

## 2019-02-01 MED FILL — ROSUVASTATIN CALCIUM 5 MG T: 5 | 45 days supply | Qty: 45 | Fill #2

## 2019-02-18 DIAGNOSIS — R3915 Urgency of urination: Secondary | ICD-10-CM | POA: Diagnosis not present

## 2019-02-18 DIAGNOSIS — N401 Enlarged prostate with lower urinary tract symptoms: Secondary | ICD-10-CM | POA: Diagnosis not present

## 2019-02-18 DIAGNOSIS — R972 Elevated prostate specific antigen [PSA]: Secondary | ICD-10-CM | POA: Diagnosis not present

## 2019-02-18 DIAGNOSIS — R351 Nocturia: Secondary | ICD-10-CM | POA: Diagnosis not present

## 2019-02-18 MED FILL — FUROSEMIDE 40 MG TAB: 40 | 90 days supply | Qty: 90 | Fill #1

## 2019-03-01 ENCOUNTER — Telehealth: Payer: Self-pay | Admitting: Cardiology

## 2019-03-01 NOTE — Telephone Encounter (Signed)
   Wife is calling because patient is having some pain located in left chest. It has been off and on since last Wednesday. Patient is not sure if it is coming from his device or not. Denies any other symptoms. Please advise.

## 2019-03-01 NOTE — Telephone Encounter (Signed)
Pts wife called to report that the pt has been having chest discomfort... he had it Wednesday evening and he told her again last night.. he notices it when he gets up and starts to walk around but it only lasts a few seconds.. he denies SOB, dizziness... no pain with movement like musculoskeletal... he is worried about his Pacemaker.. that seems to be the location of the pain but he says it does not hurt when he presses on it and no edema and no recent heavy lifting.   He denies fever, cough, sore throat.   He is not having the pain today but I reviewed the protocol for using his Nitroglycerin... he says he feels well otherwise. He really cannot describe the pain. He only says he is worried about the pacemaker "connection".  I will call pt back with an appt.. until then he will use the nitro if his symptoms represent and worsen.. or he will call EMS if anything changes.    Pt to see Tommye Standard PA 03/04/19 but agrees to go to the ER if his pain reoccurs or worsens prior to his appt.       COVID-19 Pre-Screening Questions:  . In the past 7 to 10 days have you had a cough,  shortness of breath, headache, congestion, fever (100 or greater) body aches, chills, sore throat, or sudden loss of taste or sense of smell? NO . Have you been around anyone with known Covid 19. NO . Have you been around anyone who is awaiting Covid 19 test results in the past 7 to 10 days? NO . Have you been around anyone who has been exposed to Covid 19, or has mentioned symptoms of Covid 19 within the past 7 to 10 days? NO  If you have any concerns/questions about symptoms patients report during screening (either on the phone or at threshold). Contact the provider seeing the patient or DOD for further guidance.  If neither are available contact a member of the leadership team.

## 2019-03-03 NOTE — Progress Notes (Signed)
Cardiology Office Note Date:  03/04/2019  Patient ID:  Emmons, Toth 20-Aug-1945, MRN [id number] PCP:  [name], MD  Cardiologist:  Dr. [name] Electrophysiologist: Dr. Curt Bears    Chief Complaint:   History of Present Illness: Jeff Weaver is a 73 y.o. male with history of cerebral aneurysm s/p clipping at [facility] in 8/13 c/b short-term memory loss, cerebral hemorrhage and seizure disorder s/p VP shunt, sleep apnea (followed by [place]), HTN, chronic CHF (diastolic), hypertensive heart disease, pre-DM, symptomatic bradycardia/conduction systems dease w/PPM, h/o orthostasis.  H/o chest pain with normal nuclear stress test. He was admitted 11/2013 for chest pain and elevated troponin (0.74) -> LHC 11/26/13 revealed angiographically normal coronary arteries, mild mid LAD myocardial bridging, moderate to severely elevated LVEDP with systemic hypertension and preserved LVEF  01/03/18 with worsening dyspnea on exertion underwent repeat cardiac cath 01/12/18 showed minimal nonobstructive CAD, normal LVEF, normal LVEDP.   He has required adjustment of his meds/diuretics with diastolic HF.  He comes in today to be seen for Drs. Nelson/Camnitz with c/o CP   He last saw Dr. Orlean Patten via tele-health visit may this year, doing well, no symptoms, no medication adjustments.  Planned for 4 mo f/u. He had a virtual visit with Dr. Curt Bears June 2020, his brother had recently unexpectedly died (at 50) otherwise doing well, without complaints, no changes were  amde to continue remote transmisisons.  The patient is hard of hearing and comes accompanied by his daughter.  He is concerned about some CP he has been having. 1. Originates from very low in his abdomen/LLQ and slowly travels upwards stopping just below his PPM.  This is random, no associated symptoms, not positional or exertional 2. A Left sided chest pain, he locates at his PPM, this is also random, can happen at rest or with exertion, r sitting can help  when he is up and around, though also said rubbing the area does seem to change this pain, not better or worse, just makes it different.    He also mentions that tylenol typically makes both of his pains better.  His daughter reports that they called for the appt Friday because he had a particularly stong pain that he hased to take ASA for.  I had a hard time getting an idea if these pains are at all similar to last year or historically in any way, was unclear, but perhaps not, no associated SOB, no diaphoresis, n/v.   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 Aortic insufficiency    a. mild 2017 echo.  . Cerebral aneurysm    a. s/p clipping at [facility] in 8/13 c/b short-term memory loss, cerebral hemorrhage and seizure disorder s/p VP shunt.  . Cerebral hemorrhage ([place])   . Chronic diastolic CHF (congestive heart failure) ([place])   . CVA (cerebral infarction)   . Diverticulosis   . Enteritis (regional)    Dr Olevia Perches  . First degree AV block   . GI bleed   . HTN (hypertension)   . Hyperlipidemia   . Hypertensive heart disease   . IBS (irritable bowel syndrome)   . Mild CAD    Cardiac cath 01/12/18 showed minimal nonobstructive CAD, normal LVEF, normal LVEDP.  Marland Kitchen NSVT (nonsustained ventricular tachycardia) ([place])   . Orthostatic hypotension   . Pre-diabetes   . PVC's (premature ventricular contractions)   . 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 Rheumatoid  arthritis(714.0)    dxed in [place] in 1980s  . Second degree AV block, Mobitz type I   . Seizures ([place])   . Sinus bradycardia   . Sleep apnea   . SSS (sick sinus syndrome) ([place])    a. s/p PPM 07/02/18.    Past Surgical History:  Procedure Laterality Date  . CHOLECYSTECTOMY N/A 01/07/2016   Procedure: LAPAROSCOPIC CHOLECYSTECTOMY ;  Surgeon: Coralie Keens, MD;  Location: [place];  Service: General;  Laterality: N/A;  . cns shunt  02/23/12  .  COLONOSCOPY W/ POLYPECTOMY  1978   negative 2009, Dr Olevia Perches. Due 2014  . CRANIOTOMY  02/02/12   Dr Harvel Ricks, [place] of aneurysm  . 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 HERNIA REPAIR    . LEFT HEART CATH AND CORONARY ANGIOGRAPHY N/A 01/12/2018   Procedure: LEFT HEART CATH AND CORONARY ANGIOGRAPHY;  Surgeon: Martinique, Peter M, MD;  Location: [facility] CV LAB;  Service: Cardiovascular;  Laterality: N/A;  . LEFT HEART CATHETERIZATION WITH CORONARY ANGIOGRAM N/A 11/26/2013   Procedure: LEFT HEART CATHETERIZATION WITH CORONARY ANGIOGRAM;  Surgeon: [name], MD;  Location: [facility] CATH LAB;  Service: Cardiovascular;  Laterality: N/A;  . NOSE SURGERY    . PACEMAKER IMPLANT N/A 07/02/2018   Procedure: PACEMAKER IMPLANT;  Surgeon: Constance Haw, MD;  Location: [place] CV LAB;  Service: Cardiovascular;  Laterality: N/A;  . SHOULDER SURGERY  1997  . TONSILLECTOMY AND ADENOIDECTOMY    . VENTRICULOPERITONEAL SHUNT  02-23-12   INSERTION OF RIGHT FRONTAL VENTRICULOPERITONEAL SHUNT WITH A CODMAN HAKIM PROGRAMMABLE VALVE    Current Outpatient Medications  Medication Sig Dispense Refill  . acetaminophen (TYLENOL) 325 MG tablet Take 325-650 mg by mouth every 6 (six) hours as needed for headache.    Marland Kitchen amoxicillin (AMOXIL) 500 MG capsule Take 2,000 mg by mouth as directed. Take 2000 mg by mouth 1 hour prior to dental appointments    . aspirin EC 81 MG tablet Take 1 tablet (81 mg total) by mouth daily. 90 tablet 3  . blood glucose meter kit and supplies KIT Dispense based on patient and insurance preference. Use up to four times daily as directed. (prediabetes hypoglycemia). 1 each 0  . cetirizine (ZYRTEC) 10 MG tablet Take 1 tablet (10 mg total) by mouth daily. 30 tablet 11  . chlorhexidine (PERIDEX) 0.12 % solution     . cholecalciferol (VITAMIN D) 1000 units tablet Take 1,000 Units by mouth daily.    . fluticasone  (FLONASE) 50 MCG/ACT nasal spray Place 1-2 sprays into both nostrils daily.    . furosemide (LASIX) 40 MG tablet TAKE 1 TABLET (40 MG TOTAL) BY MOUTH DAILY. 90 tablet 1  . levETIRAcetam (KEPPRA) 500 MG tablet Take 1,000 mg by mouth 2 (two) times daily.     Marland Kitchen LORazepam (ATIVAN) 1 MG tablet Take 1 mg by mouth every 6 (six) hours as needed for seizure.     . mirabegron ER (MYRBETRIQ) 25 MG TB24 tablet Take 25 mg by mouth every evening.     . nitroGLYCERIN (NITROSTAT) 0.4 MG SL tablet Place 1 tablet (0.4 mg total) under the tongue every 5 (five) minutes as needed for chest pain. 25 tablet 3  . NON FORMULARY CPAP Machine: authorized by [place] in [place]    . pyridOXINE (VITAMIN B-6) 100 MG tablet Take 100 mg by mouth daily.    . rosuvastatin (CRESTOR) 5 MG tablet Take  1 tablet three times week and increase as tolerated 45 tablet 2  . Tamsulosin HCl (FLOMAX) 0.4 MG CAPS Take 0.4 mg by mouth every evening.     . thiamine 100 MG tablet Take 100 mg by mouth daily.    Marland Kitchen VIMPAT 100 MG TABS Take 200 mg by mouth 2 (two) times daily.    . vitamin B-12 (CYANOCOBALAMIN) 1000 MCG tablet Take 1,000 mcg by mouth daily.     No current facility-administered medications for this visit.     Allergies:   Haloperidol, Rosuvastatin, Simvastatin, Lisinopril, Nifedipine, and Pravastatin   Social History:  The patient  reports that he quit smoking about 10 years ago. His smoking use included pipe. He smoked 0.00 packs per day for 30.00 years. He has quit using smokeless tobacco. He reports current alcohol use. He reports that he does not use drugs.   Family History:  The patient's family history includes COPD in his father; Coronary artery disease in his father; Dementia (age of onset: 64) in his sister; Diabetes in his brother and sister; Hepatitis in his mother.  ROS:  Please see the history of present illness.   All other systems are reviewed and otherwise negative.   PHYSICAL EXAM:  VS:  BP 134/82   Pulse 71   Ht 5'  8" (1.727 m)   Wt 259 lb (117.5 kg)   BMI 39.38 kg/m  BMI: Body mass index is 39.38 kg/m. Well nourished, well developed, in no acute distress  HEENT: normocephalic, atraumatic  Neck: no JVD, carotid bruits or masses Cardiac:  RRR; no significant murmurs, no rubs, or gallops Lungs:  CTA b/l, no wheezing, rhonchi or rales  Abd: soft, nontender, obese MS: no deformity or, age appropriate  atrophy Ext: no edema  Skin: warm and dry, no rash Neuro:  No gross deficits appreciated Psych: euthymic mood, full affect  PPM site is stable, no tethering or discomfort   EKG:  Done today and reviewed by myself shows AV paced PPM interrogation done today and reviewed by myself: battery and lead measurements are good No R waves at 40, R wave measurement was a PVC No device observations, arrhythmias noted AP 85.2%, VP 89.6% He presents to day AVP    01/12/18: LHC  The left ventricular systolic function is normal.  LV end diastolic pressure is normal.  The left ventricular ejection fraction is 55-65% by visual estimate.   1. Minimal nonobstructive CAD 2. Normal LV function 3. Normal LVEDP  Plan: medical management.  Addendum: during recovery patient developed Second degree AV block Mobitz type 1 with HR into the 40s. He is asymptomatic. No prior history of dizziness or syncope. On no rate slowing medication. Baseline Ecg with very long PR interval. He is instructed to notify us if he develops symptoms. May need Holter monitor as outpatient.    01/06/16  TTE Study Conclusions - Left ventricle: The cavity size was normal. There was moderate   concentric hypertrophy. Systolic function was normal. The   estimated ejection fraction was in the range of 60% to 65%. Wall   motion was normal; there were no regional wall motion   abnormalities. Features are consistent with a pseudonormal left   ventricular filling pattern, with concomitant abnormal relaxation   and increased filling pressure  (grade 2 diastolic dysfunction). - Aortic valve: There was mild regurgitation.     Recent Labs: 04/18/2018: ALT 32 06/07/2018: NT-Pro BNP 480 07/02/2018: BUN 15; Creatinine, Ser 1.06; Hemoglobin 14.7; Platelets 163;  Potassium 4.3; Sodium 136  04/18/2018: Cholesterol 131; HDL 40.70; LDL Cholesterol 64; Total CHOL/HDL Ratio 3; Triglycerides 131.0; VLDL 26.2   CrCl cannot be calculated (Patient's most recent lab result is older than the maximum 21 days allowed.).   Wt Readings from Last 3 Encounters:  03/04/19 259 lb (117.5 kg)  11/14/18 256 lb (116.1 kg)  10/02/18 256 lb 8 oz (116.3 kg)     Other studies reviewed: Additional studies/records reviewed today include: summarized above    ASSESSMENT AND PLAN:  1. CP     Sounds fairly atypical.     He locates everything ending up or starting at his PPM.  He is observed to constantly be rubbing/feeling the area.     The site looks great, no evidence of infection, skin changes      Some of what he describes sounds very chest wall/musculoskeletal, and even the discomfort that he says happens when up and around seems to be help with sitting but also coincidentally rubbing the area also changes the same discomfort.  He had LHC only a year ago with minimal disease, and in 2015 as well, though this noted a mild muscle bridge He has some degree of known orthostatic symptoms, would hold off for now CCB/nitrate with atypical sounding complaints  He is urged to try and not rub the area of his pacemaker, though I think he does it subconsciously/habit. Discussed with the patient and his daughter, notify if any change, escalation in his symptoms, development of associated symptoms.  2. PPM     Intact function, no programming changes made  3. HTN 4. H/o orthostasis     He has some symptoms particularly when first getting up outo f bed in the morning     Has very good habits and symptoms recognition      Has not had any near syncope or syncope.      On furosemide... will leave it for now    Disposition: F/u with Dr. [name] or cardiology APP in 6 weeks, sooner if needed.   Current medicines are reviewed at length with the patient today.  The patient did not have any concerns regarding medicines.  Venetia Night, PA-C 03/04/2019 10:33 AM     [address] [place] [address] [phone] (office)  [phone] (fax)

## 2019-03-04 ENCOUNTER — Encounter: Payer: Self-pay | Admitting: Physician Assistant

## 2019-03-04 ENCOUNTER — Ambulatory Visit (INDEPENDENT_AMBULATORY_CARE_PROVIDER_SITE_OTHER): Payer: Medicare Other | Admitting: Physician Assistant

## 2019-03-04 ENCOUNTER — Ambulatory Visit: Payer: Medicare Other | Admitting: Cardiology

## 2019-03-04 ENCOUNTER — Other Ambulatory Visit: Payer: Self-pay

## 2019-03-04 VITALS — BP 134/82 | HR 71 | Ht 68.0 in | Wt 259.0 lb

## 2019-03-04 DIAGNOSIS — I214 Non-ST elevation (NSTEMI) myocardial infarction: Secondary | ICD-10-CM

## 2019-03-04 DIAGNOSIS — R42 Dizziness and giddiness: Secondary | ICD-10-CM | POA: Diagnosis not present

## 2019-03-04 DIAGNOSIS — I25118 Atherosclerotic heart disease of native coronary artery with other forms of angina pectoris: Secondary | ICD-10-CM

## 2019-03-04 DIAGNOSIS — R0789 Other chest pain: Secondary | ICD-10-CM | POA: Diagnosis not present

## 2019-03-04 DIAGNOSIS — Z950 Presence of cardiac pacemaker: Secondary | ICD-10-CM | POA: Diagnosis not present

## 2019-03-04 DIAGNOSIS — I10 Essential (primary) hypertension: Secondary | ICD-10-CM | POA: Diagnosis not present

## 2019-03-04 NOTE — Patient Instructions (Addendum)
Medication Instructions:  Your physician recommends that you continue on your current medications as directed. Please refer to the Current Medication list given to you today.  If you need a refill on your cardiac medications before your next appointment, please call your pharmacy.   Lab work: NONE ORDERED  TODAY   If you have labs (blood work) drawn today and your tests are completely normal, you will receive your results only by: Marland Kitchen MyChart Message (if you have MyChart) OR . A paper copy in the mail If you have any lab test that is abnormal or we need to change your treatment, we will call you to review the results.  Testing/Procedures: NONE ORDERED  TODAY   Follow-Up: At [facility], you and your health needs are our priority.  As part of our continuing mission to provide you with exceptional heart care, we have created designated Provider Care Teams.  These Care Teams include your primary Cardiologist (physician) and Advanced Practice Providers (APPs -  Physician Assistants and Nurse Practitioners) who all work together to provide you with the care you need, when you need it. You will need a follow up appointment in 6 weeks.  You may see Ena Dawley, MD or one of the following Advanced Practice Providers on your designated Care Team:   [place], PA-C [name], PA-C . Ermalinda Barrios, PA-C  Any Other Special Instructions Will Be Listed Below (If Applicable).

## 2019-03-05 ENCOUNTER — Telehealth: Payer: Self-pay | Admitting: *Deleted

## 2019-03-05 NOTE — Telephone Encounter (Signed)

## 2019-03-05 NOTE — Telephone Encounter (Signed)
Download received and sent to medical records to be scanned.

## 2019-03-05 NOTE — Telephone Encounter (Signed)
Carlie Corpus called in to say she made an appointment for her husband so we would not have to call them. The tele consent was obtained at the time of the call.

## 2019-03-06 ENCOUNTER — Ambulatory Visit (INDEPENDENT_AMBULATORY_CARE_PROVIDER_SITE_OTHER): Payer: Medicare Other

## 2019-03-06 ENCOUNTER — Other Ambulatory Visit: Payer: Self-pay

## 2019-03-06 DIAGNOSIS — Z23 Encounter for immunization: Secondary | ICD-10-CM | POA: Diagnosis not present

## 2019-03-18 ENCOUNTER — Telehealth: Payer: Self-pay | Admitting: Cardiology

## 2019-03-18 ENCOUNTER — Other Ambulatory Visit: Payer: Medicare Other | Admitting: *Deleted

## 2019-03-18 ENCOUNTER — Other Ambulatory Visit: Payer: Self-pay

## 2019-03-18 DIAGNOSIS — I25118 Atherosclerotic heart disease of native coronary artery with other forms of angina pectoris: Secondary | ICD-10-CM | POA: Diagnosis not present

## 2019-03-18 DIAGNOSIS — I5033 Acute on chronic diastolic (congestive) heart failure: Secondary | ICD-10-CM

## 2019-03-18 DIAGNOSIS — I10 Essential (primary) hypertension: Secondary | ICD-10-CM

## 2019-03-18 DIAGNOSIS — E785 Hyperlipidemia, unspecified: Secondary | ICD-10-CM | POA: Diagnosis not present

## 2019-03-18 DIAGNOSIS — I951 Orthostatic hypotension: Secondary | ICD-10-CM | POA: Diagnosis not present

## 2019-03-18 DIAGNOSIS — I5032 Chronic diastolic (congestive) heart failure: Secondary | ICD-10-CM | POA: Diagnosis not present

## 2019-03-18 NOTE — Telephone Encounter (Signed)
Wife called. She wants to know what will need to be done on her end so that either herself or her daughter, Jiovanni Heeter would be allowed to be with the patient during every future appointment. The patient had some brain damage in the past, and has some short term memory issues as a result. He also has a tendency to get frustrated or overwhelmed when he can not remember certain things, and having either his wife or his daughter with him keeps the patient calm.   The daughter went to take the patient to his blood work today and was told that she was not allowed to be with him. The Daughter and Wife understand the safety measures in place, but worry that the patient may forget key details when in the room by himself.   Please let the wife know what she will need to do to be with her husband at all times. She does not want to have to call the office for permission before every appointment.

## 2019-03-18 NOTE — Telephone Encounter (Signed)
Spoke with the pts wife and informed her that all his visits noted in the system in our office I have placed a note in the appt notes that either wife/daughter must accompany pt to these visits, due to brain damage and memory issues.  Advised the pts wife though that within the future, if they schedule any office visit/labs/tests in our office, to please call the office and let myself or a triage nurse know that either her or the daughter must accompany him to these visits, so that we may place a note in his appt desk.  Wife verbalized understanding and agrees with this plan.

## 2019-03-19 ENCOUNTER — Telehealth: Payer: Self-pay

## 2019-03-19 LAB — LIPID PANEL
Chol/HDL Ratio: 3.3 ratio (ref 0.0–5.0)
Cholesterol, Total: 150 mg/dL (ref 100–199)
HDL: 46 mg/dL (ref >39)
LDL Chol Calc (NIH): 79 mg/dL (ref 0–99)
Triglycerides: 146 mg/dL (ref 0–149)
VLDL Cholesterol Cal: 25 mg/dL (ref 5–40)

## 2019-03-19 LAB — COMPREHENSIVE METABOLIC PANEL
ALT: 46 IU/L — ABNORMAL HIGH (ref 0–44)
AST: 25 IU/L (ref 0–40)
Albumin/Globulin Ratio: 1.6 (ref 1.2–2.2)
Albumin: 4.2 g/dL (ref 3.7–4.7)
Alkaline Phosphatase: 47 IU/L (ref 39–117)
BUN/Creatinine Ratio: 11 (ref 10–24)
BUN: 11 mg/dL (ref 8–27)
Bilirubin Total: 0.4 mg/dL (ref 0.0–1.2)
CO2: 27 mmol/L (ref 20–29)
Calcium: 9.5 mg/dL (ref 8.6–10.2)
Chloride: 96 mmol/L (ref 96–106)
Creatinine, Ser: 1.02 mg/dL (ref 0.76–1.27)
GFR calc Af Amer: 83 mL/min/{1.73_m2} (ref >59)
GFR calc non Af Amer: 72 mL/min/{1.73_m2} (ref >59)
Globulin, Total: 2.6 g/dL (ref 1.5–4.5)
Glucose: 127 mg/dL — ABNORMAL HIGH (ref 65–99)
Potassium: 4 mmol/L (ref 3.5–5.2)
Sodium: 139 mmol/L (ref 134–144)
Total Protein: 6.8 g/dL (ref 6.0–8.5)

## 2019-03-19 LAB — PRO B NATRIURETIC PEPTIDE: NT-Pro BNP: 292 pg/mL (ref 0–376)

## 2019-03-19 NOTE — Telephone Encounter (Signed)
LMTCB

## 2019-03-19 NOTE — Telephone Encounter (Signed)
-----   Message from Dorothy Spark, MD sent at 03/18/2019  9:53 PM EDT ----- Normal CMP, and lipids, BNP is pending

## 2019-03-19 NOTE — Telephone Encounter (Signed)
llow Up:    Wife returning [place] call, concerning pt results.

## 2019-03-19 NOTE — Telephone Encounter (Signed)
Returned call to wife.  Advised Pt lab work was WNL.  Wife indicates understanding.

## 2019-03-20 NOTE — Progress Notes (Signed)
Virtual Visit via Telephone Note   This visit type was conducted due to national recommendations for restrictions regarding the COVID-19 Pandemic (e.g. social distancing) in an effort to limit this patient's exposure and mitigate transmission in our community.  Due to his co-morbid illnesses, this patient is at least at moderate risk for complications without adequate follow up.  This format is felt to be most appropriate for this patient at this time.  The patient did not have access to video technology/had technical difficulties with video requiring transitioning to audio format only (telephone).  All issues noted in this document were discussed and addressed.  No physical exam could be performed with this format.  Please refer to the patient's chart for his  consent to telehealth for [facility].   Date:  03/22/2019   ID:  Jeff Weaver, DOB 10/28/1945, MRN [id number]  Patient Location: Home Provider Location: Home  PCP:  [name], MD  Cardiologist:  Ena Dawley, MD  Electrophysiologist:  Constance Haw, MD   Evaluation Performed:  Follow-Up Visit  Chief Complaint:  LE edema  History of Present Illness:    Jeff Weaver is a 73 y.o. 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  In 2014 evaluated for chest pain with normal nuclear stress test. He was admitted 11/2013 for chest pain and elevated troponin (0.74) -> LHC 11/26/13 revealed angiographically normal coronary arteries, mild mid LAD myocardial bridging, moderate to severely elevated LVEDP with systemic hypertension and preserved LVEF. Last echo 2D echo in 01/2016 showed mod LVH,  EF 60-65%, grade 2 DD, no RWMA, mild AI. More recently he was seen in the office 01/03/18 with worsening dyspnea on exertion. Cardiac cath 01/12/18 showed minimal nonobstructive CAD, normal LVEF, normal LVEDP. Holter 02/2018 showed profound bradycardia with HR down to 30 even during waking hours, he underwent a PM placement. In 06/2018 lasix added for worsening LE edema with PND. Labs had shown BNP 480. Otherwise f/u labs 07/02/18 showed Hgb 14.7, K 4.3, Cr 1.06, 04/2018 LDL 64, LFTs wnl, 02/2018 TSH wnl. He underwent PPM implantation 07/02/18 with Medtronic Azure XT DR MRI SureScan dual-chamber pacemaker. Seen by [name] in 07/2018.  11/14/2018 - the patient is doing well, he denies any CP, SOB, he has mild residual LE edema at the end of days. Mild orthostatic hypotension with no falls. No palpitations, no discharge from the PM insertion site.  Compliant with meds.  03/22/2019 - 6 months follow up, the patient is complaining of weight gain, no LE edema, orthopnea or PND, he walks minimally and snacks a lot. Denies any chest pain.  The patient does not have symptoms concerning for COVID-19 infection (fever, chills, cough, or new shortness of breath).   Past Medical History:  Diagnosis Date  . Aortic insufficiency    a. mild 2017 echo.  . Cerebral aneurysm    a. s/p clipping at [facility] in 8/13 c/b short-term memory loss, cerebral hemorrhage and seizure disorder s/p VP shunt.  . Cerebral hemorrhage ([place])   . Chronic diastolic CHF (congestive heart failure) ([place])   . CVA (cerebral infarction)   . Diverticulosis   . Enteritis (regional)  Dr Olevia Perches  . First degree AV block   . GI bleed   . HTN (hypertension)   . Hyperlipidemia   . Hypertensive heart disease   . IBS (irritable bowel syndrome)   . Mild CAD    Cardiac cath 01/12/18 showed minimal nonobstructive CAD, normal LVEF, normal LVEDP.  Marland Kitchen NSVT (nonsustained ventricular tachycardia) ([place])   . Orthostatic hypotension   . Pre-diabetes   .  PVC's (premature ventricular contractions)   . 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 Rheumatoid arthritis(714.0)    dxed in [employer] in 1980s  . Second degree AV block, Mobitz type I   . Seizures ([place])   . Sinus bradycardia   . Sleep apnea   . SSS (sick sinus syndrome) ([place])    a. s/p PPM 07/02/18.   Past Surgical History:  Procedure Laterality Date  . CHOLECYSTECTOMY N/A 01/07/2016   Procedure: LAPAROSCOPIC CHOLECYSTECTOMY ;  Surgeon: Coralie Keens, MD;  Location: [place];  Service: General;  Laterality: N/A;  . cns shunt  02/23/12  . COLONOSCOPY W/ POLYPECTOMY  1978   negative 2009, Dr Olevia Perches. Due 2014  . CRANIOTOMY  02/02/12   Dr Harvel Ricks, [place] of aneurysm  . 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 HERNIA REPAIR    . LEFT HEART CATH AND CORONARY ANGIOGRAPHY N/A 01/12/2018   Procedure: LEFT HEART CATH AND CORONARY ANGIOGRAPHY;  Surgeon: Martinique, Peter M, MD;  Location: [place] CV LAB;  Service: Cardiovascular;  Laterality: N/A;  . LEFT HEART CATHETERIZATION WITH CORONARY ANGIOGRAM N/A 11/26/2013   Procedure: LEFT HEART CATHETERIZATION WITH CORONARY ANGIOGRAM;  Surgeon: [name], MD;  Location: [facility] CATH LAB;  Service: Cardiovascular;  Laterality: N/A;  . NOSE SURGERY    . PACEMAKER IMPLANT N/A 07/02/2018   Procedure: PACEMAKER IMPLANT;  Surgeon: Constance Haw, MD;  Location: [place] CV LAB;  Service: Cardiovascular;  Laterality: N/A;  . SHOULDER SURGERY  1997  . TONSILLECTOMY AND ADENOIDECTOMY    . VENTRICULOPERITONEAL SHUNT  02-23-12   INSERTION OF RIGHT FRONTAL VENTRICULOPERITONEAL SHUNT WITH A CODMAN HAKIM PROGRAMMABLE VALVE     Current Meds  Medication Sig  . acetaminophen (TYLENOL) 325 MG tablet Take 325-650 mg by mouth every 6 (six) hours as needed for headache.  Marland Kitchen amoxicillin (AMOXIL) 500 MG capsule Take 2,000 mg by mouth as directed. Take 2000 mg by mouth 1 hour  prior to dental appointments  . aspirin EC 81 MG tablet Take 1 tablet (81 mg total) by mouth daily.  . cetirizine (ZYRTEC) 10 MG tablet Take 1 tablet (10 mg total) by mouth daily.  . cholecalciferol (VITAMIN D) 1000 units tablet Take 1,000 Units by mouth daily.  . fluticasone (FLONASE) 50 MCG/ACT nasal spray Place 1-2 sprays into both nostrils daily.  . furosemide (LASIX) 40 MG tablet TAKE 1 TABLET (40 MG TOTAL) BY MOUTH DAILY.  Marland Kitchen levETIRAcetam (KEPPRA) 500 MG tablet Take 1,000 mg by mouth 2 (two) times daily.   Marland Kitchen LORazepam (ATIVAN) 1 MG tablet Take 1 mg by mouth every 6 (six) hours as needed for seizure.   . mirabegron ER (MYRBETRIQ) 25 MG TB24 tablet Take 40 mg by mouth every evening.   . nitroGLYCERIN (NITROSTAT) 0.4 MG SL tablet Place 1 tablet (0.4 mg total) under the tongue every 5 (five) minutes as needed for chest pain.  Marland Kitchen pyridOXINE (VITAMIN B-6) 100 MG tablet Take 100 mg by mouth daily.  Marland Kitchen  rosuvastatin (CRESTOR) 5 MG tablet Take 1 tablet three times week and increase as tolerated  . Tamsulosin HCl (FLOMAX) 0.4 MG CAPS Take 0.4 mg by mouth every evening.   . thiamine 100 MG tablet Take 100 mg by mouth daily.  Marland Kitchen VIMPAT 100 MG TABS Take 200 mg by mouth 2 (two) times daily.  . vitamin B-12 (CYANOCOBALAMIN) 1000 MCG tablet Take 1,000 mcg by mouth daily.     Allergies:   Haloperidol, Rosuvastatin, Simvastatin, Lisinopril, Nifedipine, and Pravastatin   Social History   Tobacco Use  . Smoking status: Former Smoker    Packs/day: 0.00    Years: 30.00    Pack years: 0.00    Types: Pipe    Quit date: 07/04/2008    Years since quitting: 10.7  . Smokeless tobacco: Former Network engineer Use Topics  . Alcohol use: Yes    Alcohol/week: 0.0 standard drinks    Comment: Very Infrequently   . Drug use: No     Family Hx: The patient's family history includes COPD in his father; Coronary artery disease in his father; Dementia (age of onset: 39) in his sister; Diabetes in his brother and  sister; Hepatitis in his mother. There is no history of Colon cancer.  ROS:   Please see the history of present illness.    All other systems reviewed and are negative.   Prior CV studies:   The following studies were reviewed today:   Labs/Other Tests and Data Reviewed:    EKG:  No ECG reviewed.  Recent Labs: 07/02/2018: Hemoglobin 14.7; Platelets 163 03/18/2019: ALT 46; BUN 11; Creatinine, Ser 1.02; NT-Pro BNP 292; Potassium 4.0; Sodium 139   Recent Lipid Panel Lab Results  Component Value Date/Time   CHOL 150 03/18/2019 10:11 AM   CHOL 159 03/27/2013 08:18 AM   TRIG 146 03/18/2019 10:11 AM   TRIG 127 03/27/2013 08:18 AM   HDL 46 03/18/2019 10:11 AM   HDL 45 03/27/2013 08:18 AM   CHOLHDL 3.3 03/18/2019 10:11 AM   CHOLHDL 3 04/18/2018 10:23 AM   LDLCALC 64 04/18/2018 10:23 AM   LDLCALC 56 09/08/2017 12:02 PM   LDLCALC 89 03/27/2013 08:18 AM   LDLDIRECT 87.6 09/19/2012 12:06 PM   Wt Readings from Last 3 Encounters:  03/22/19 254 lb (115.2 kg)  03/04/19 259 lb (117.5 kg)  11/14/18 256 lb (116.1 kg)    Objective:    Vital Signs:  BP 107/77   Pulse 82   Wt 254 lb (115.2 kg)   BMI 38.62 kg/m    VITAL SIGNS:  reviewed  ASSESSMENT & PLAN:    1. Acute on chronic diastolic CHF 1. 9/76/7341 Crea 1.0, LFTS normal, Na 139, K 4.0, BNP 480-->292, weight down 5 lbs, no LE edema 2. SSS s/p PPM - functioning well 3. PVCs/NSVT - can be followed by EP via pacemaker now to further assess burden. 4. Mild aortic insufficiency - stable 5. Hyperlipidemia: on rosuvastatin 5 mg po daily, Lipids 03/17/2009: HDL: 46, TG 146, LDL 64 6. Obesity - he is advised to walk 5x/wee, he is trying to get into a weight loss program at [facility], but is on the waitlist.  COVID-19 Education: The signs and symptoms of COVID-19 were discussed with the patient and how to seek care for testing (follow up with PCP or arrange E-visit).  The importance of social distancing was discussed today.  Time:    Today, I have spent 25 minutes with the patient with telehealth technology discussing  the above problems.     Medication Adjustments/Labs and Tests Ordered: Current medicines are reviewed at length with the patient today.  Concerns regarding medicines are outlined above.   Tests Ordered: No orders of the defined types were placed in this encounter.  Medication Changes: No orders of the defined types were placed in this encounter.  Disposition:  Follow up in 6 month(s) with labs.  Signed, Ena Dawley, MD  03/22/2019 11:20 AM    [place]

## 2019-03-22 ENCOUNTER — Encounter: Payer: Self-pay | Admitting: *Deleted

## 2019-03-22 ENCOUNTER — Encounter: Payer: Self-pay | Admitting: Cardiology

## 2019-03-22 ENCOUNTER — Telehealth (INDEPENDENT_AMBULATORY_CARE_PROVIDER_SITE_OTHER): Payer: Medicare Other | Admitting: Cardiology

## 2019-03-22 ENCOUNTER — Other Ambulatory Visit: Payer: Self-pay

## 2019-03-22 ENCOUNTER — Telehealth: Payer: Self-pay | Admitting: *Deleted

## 2019-03-22 DIAGNOSIS — I5032 Chronic diastolic (congestive) heart failure: Secondary | ICD-10-CM | POA: Diagnosis not present

## 2019-03-22 DIAGNOSIS — I25118 Atherosclerotic heart disease of native coronary artery with other forms of angina pectoris: Secondary | ICD-10-CM | POA: Diagnosis not present

## 2019-03-22 DIAGNOSIS — I10 Essential (primary) hypertension: Secondary | ICD-10-CM

## 2019-03-22 NOTE — Patient Instructions (Signed)
Medication Instructions:   Your physician recommends that you continue on your current medications as directed. Please refer to the Current Medication list given to you today.  If you need a refill on your cardiac medications before your next appointment, please call your pharmacy.     Follow-Up: At [facility], you and your health needs are our priority.  As part of our continuing mission to provide you with exceptional heart care, we have created designated Provider Care Teams.  These Care Teams include your primary Cardiologist (physician) and Advanced Practice Providers (APPs -  Physician Assistants and Nurse Practitioners) who all work together to provide you with the care you need, when you need it.  Your physician wants you to follow-up in: [place] will receive a reminder letter in the mail two months in advance. If you don't receive a letter, please call our office to schedule the follow-up appointment.

## 2019-03-22 NOTE — Telephone Encounter (Signed)
-----   Message from Dorothy Spark, MD sent at 03/22/2019 11:46 AM EDT ----- Good morning!  First of all that you for your wonderful program, I have only hear good things from my patients that are greatly enjoying it!  One of my patients has been on the waiting list, I was just wondering if there is anyway to get him in rather sooner than later as he struggles with weight management.  Thank you and have a great day,  Ena Dawley, MD

## 2019-04-03 ENCOUNTER — Ambulatory Visit (INDEPENDENT_AMBULATORY_CARE_PROVIDER_SITE_OTHER): Payer: Medicare Other | Admitting: *Deleted

## 2019-04-03 DIAGNOSIS — I495 Sick sinus syndrome: Secondary | ICD-10-CM | POA: Diagnosis not present

## 2019-04-03 LAB — CUP PACEART REMOTE DEVICE CHECK
Battery Remaining Longevity: 129 mo
Battery Voltage: 3.04 V
Brady Statistic AP VP Percent: 82.77 %
Brady Statistic AP VS Percent: 0.25 %
Brady Statistic AS VP Percent: 15.86 %
Brady Statistic AS VS Percent: 1.11 %
Brady Statistic RA Percent Paced: 83.32 %
Brady Statistic RV Percent Paced: 98.63 %
Date Time Interrogation Session: 20200930004346
Implantable Lead Implant Date: 20191230
Implantable Lead Implant Date: 20191230
Implantable Lead Location: 753859
Implantable Lead Location: 753860
Implantable Lead Model: 5076
Implantable Lead Model: 5076
Implantable Pulse Generator Implant Date: 20191230
Lead Channel Impedance Value: 304 Ohm
Lead Channel Impedance Value: 361 Ohm
Lead Channel Impedance Value: 399 Ohm
Lead Channel Impedance Value: 646 Ohm
Lead Channel Pacing Threshold Amplitude: 0.625 V
Lead Channel Pacing Threshold Amplitude: 1.25 V
Lead Channel Pacing Threshold Pulse Width: 0.4 ms
Lead Channel Pacing Threshold Pulse Width: 0.4 ms
Lead Channel Sensing Intrinsic Amplitude: 2.625 mV
Lead Channel Sensing Intrinsic Amplitude: 2.625 mV
Lead Channel Sensing Intrinsic Amplitude: 9.375 mV
Lead Channel Sensing Intrinsic Amplitude: 9.375 mV
Lead Channel Setting Pacing Amplitude: 1.5 V
Lead Channel Setting Pacing Amplitude: 2.5 V
Lead Channel Setting Pacing Pulse Width: 0.4 ms
Lead Channel Setting Sensing Sensitivity: 1.2 mV

## 2019-04-09 ENCOUNTER — Encounter: Payer: Self-pay | Admitting: Cardiology

## 2019-04-09 ENCOUNTER — Ambulatory Visit (INDEPENDENT_AMBULATORY_CARE_PROVIDER_SITE_OTHER): Payer: Medicare Other | Admitting: Family Medicine

## 2019-04-09 NOTE — Progress Notes (Signed)
Remote pacemaker transmission.   

## 2019-04-11 ENCOUNTER — Telehealth: Payer: Self-pay | Admitting: Physician Assistant

## 2019-04-11 NOTE — Telephone Encounter (Signed)
Called pt re: appt 04/15/2019.  Since pt had seen Dr. [name] virtually, and she checked labs and all, per [name], PA-C, unless pt is having some problems, we can cancel this appt (it was scheduled before Dr. [name] saw pt) Per pt, he is doing ok, and would like to cancel appt. Pt aware to f/u with Dr. [name] in 6 months.

## 2019-04-11 NOTE — Telephone Encounter (Signed)
   While reviewing charts for Monday, patient noted to be on schedule. This appointment was made on 8/31 when Tommye Standard recommended general cardiology f/u in 6 weeks.  However, he has since seen Dr. [name] virtually on 03/22/19 who felt he was doing well and recommended f/u in 6 months.  Can you find out if patient is having any issues? If so would keep appt on Monday but if he is doing well and same as 03/22/19, he can just f/u in 6 months as previously suggested by Dr. [name]. Would be happy to keep appointment but don't want him to have to go through a duplicate appt if doing well.  Tori Cupps PA-C

## 2019-04-15 ENCOUNTER — Ambulatory Visit: Payer: Medicare Other | Admitting: Physician Assistant

## 2019-04-16 ENCOUNTER — Ambulatory Visit: Payer: Medicare Other | Admitting: Cardiology

## 2019-04-17 NOTE — Progress Notes (Signed)
Virtual Visit via Video Note   This visit type was conducted due to national recommendations for restrictions regarding the COVID-19 Pandemic (e.g. social distancing) in an effort to limit this patient's exposure and mitigate transmission in our community.  Due to his co-morbid illnesses, this patient is at least at moderate risk for complications without adequate follow up.  This format is felt to be most appropriate for this patient at this time.  All issues noted in this document were discussed and addressed.  A limited physical exam was performed with this format.  Please refer to the patient's chart for his consent to telehealth for [facility].   Evaluation Performed:  Follow-up visit  This visit type was conducted due to national recommendations for restrictions regarding the COVID-19 Pandemic (e.g. social distancing).  This format is felt to be most appropriate for this patient at this time.  All issues noted in this document were discussed and addressed.  No physical exam was performed (except for noted visual exam findings with Video Visits).  Please refer to the patient's chart (MyChart message for video visits and phone note for telephone visits) for the patient's consent to telehealth for [facility].  Date:  04/18/2019   ID:  Jeff Weaver, DOB 08/20/1945, MRN [id number]  Patient Location:  Home  Provider location:   [place]  PCP:  [name], MD  Cardiologist:  Ena Dawley, MD  Sleep Medicine: Fransico Him, MD Electrophysiologist:  Will Meredith Leeds, MD   Chief Complaint:  OSA  History of Present Illness:    Jeff Weaver is a 73 y.o. male who presents via audio/video conferencing for a telehealth visit today.    This is a 73yo male with a hx of chronic diastolic, obesity and PPM.  He has a hx of OSA and has been on CPAP for some time.  He gets his supplies from the [place] but does not have a formal sleep MD.  He is doing well with his CPAP device and just  got a new one from the [place].  He tolerates the full face mask and feels the pressure is adequate.  Since going on CPAP he feels rested in the am if he has gotten enough sleep the night before. He tells me that he has problems taking his mask off in his sleep at night sometimes.  He has had a hx of cerebral hemorrhage and seizure d/o s/p VP shunt and is on seizure meds that make him sleepy during the day. His wife is concerned because she thinks he is now sleeping more and is worried that his CPAP may need to be adjusted.  He has some mouth dryness at times.  He does not think that he snores.    The patient does not have symptoms concerning for COVID-19 infection (fever, chills, cough, or new shortness of breath).   Prior CV studies:   The following studies were reviewed today:  none  Past Medical History:  Diagnosis Date  . Aortic insufficiency    a. mild 2017 echo.  . Cerebral aneurysm    a. s/p clipping at [facility] in 8/13 c/b short-term memory loss, cerebral hemorrhage and seizure disorder s/p VP shunt.  . Cerebral hemorrhage ([place])   . Chronic diastolic CHF (congestive heart failure) ([place])   . CVA (cerebral infarction)   . Diverticulosis   . Enteritis (regional)    Dr Olevia Perches  . First degree AV block   . GI bleed   .  HTN (hypertension)   . Hyperlipidemia   . Hypertensive heart disease   . IBS (irritable bowel syndrome)   . Mild CAD    Cardiac cath 01/12/18 showed minimal nonobstructive CAD, normal LVEF, normal LVEDP.  Marland Kitchen NSVT (nonsustained ventricular tachycardia) ([place])   . Orthostatic hypotension   . Pre-diabetes   . PVC's (premature ventricular contractions)   . 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 Rheumatoid arthritis(714.0)    dxed in [employer] in 1980s  . Second degree AV block, Mobitz type I   . Seizures ([place])   . Sinus bradycardia   . Sleep apnea   . SSS (sick sinus syndrome) ([place])    a. s/p PPM 07/02/18.   Past Surgical History:  Procedure Laterality  Date  . CHOLECYSTECTOMY N/A 01/07/2016   Procedure: LAPAROSCOPIC CHOLECYSTECTOMY ;  Surgeon: Coralie Keens, MD;  Location: [place];  Service: General;  Laterality: N/A;  . cns shunt  02/23/12  . COLONOSCOPY W/ POLYPECTOMY  1978   negative 2009, Dr Olevia Perches. Due 2014  . CRANIOTOMY  02/02/12   Dr Harvel Ricks, [place] of aneurysm  . 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 HERNIA REPAIR    . LEFT HEART CATH AND CORONARY ANGIOGRAPHY N/A 01/12/2018   Procedure: LEFT HEART CATH AND CORONARY ANGIOGRAPHY;  Surgeon: Martinique, Peter M, MD;  Location: [place] CV LAB;  Service: Cardiovascular;  Laterality: N/A;  . LEFT HEART CATHETERIZATION WITH CORONARY ANGIOGRAM N/A 11/26/2013   Procedure: LEFT HEART CATHETERIZATION WITH CORONARY ANGIOGRAM;  Surgeon: [name], MD;  Location: [facility] CATH LAB;  Service: Cardiovascular;  Laterality: N/A;  . NOSE SURGERY    . PACEMAKER IMPLANT N/A 07/02/2018   Procedure: PACEMAKER IMPLANT;  Surgeon: Constance Haw, MD;  Location: [place] CV LAB;  Service: Cardiovascular;  Laterality: N/A;  . SHOULDER SURGERY  1997  . TONSILLECTOMY AND ADENOIDECTOMY    . VENTRICULOPERITONEAL SHUNT  02-23-12   INSERTION OF RIGHT FRONTAL VENTRICULOPERITONEAL SHUNT WITH A CODMAN HAKIM PROGRAMMABLE VALVE     Current Meds  Medication Sig  . acetaminophen (TYLENOL) 325 MG tablet Take 325-650 mg by mouth every 6 (six) hours as needed for headache.  Marland Kitchen amoxicillin (AMOXIL) 500 MG capsule Take 2,000 mg by mouth as directed. Take 2000 mg by mouth 1 hour prior to dental appointments  . aspirin EC 81 MG tablet Take 1 tablet (81 mg total) by mouth daily.  . cetirizine (ZYRTEC) 10 MG tablet Take 1 tablet (10 mg total) by mouth daily.  . cholecalciferol (VITAMIN D) 1000 units tablet Take 1,000 Units by mouth daily.  . fluticasone (FLONASE) 50 MCG/ACT nasal spray Place 1-2 sprays into both nostrils daily as needed.   .  furosemide (LASIX) 40 MG tablet TAKE 1 TABLET (40 MG TOTAL) BY MOUTH DAILY.  Marland Kitchen levETIRAcetam (KEPPRA) 500 MG tablet Take 1,000 mg by mouth 2 (two) times daily.   Marland Kitchen LORazepam (ATIVAN) 1 MG tablet Take 1 mg by mouth every 6 (six) hours as needed for seizure.   . mirabegron ER (MYRBETRIQ) 25 MG TB24 tablet Take 50 mg by mouth every evening.   . nitroGLYCERIN (NITROSTAT) 0.4 MG SL tablet Place 1 tablet (0.4 mg total) under the tongue every 5 (five) minutes as needed for chest pain.  . NON FORMULARY CPAP Machine: authorized by [facility] in [place]  . pyridOXINE (VITAMIN B-6) 100 MG tablet Take 100 mg by mouth daily.  Marland Kitchen  rosuvastatin (CRESTOR) 5 MG tablet Take 1 tablet three times week and increase as tolerated  . Tamsulosin HCl (FLOMAX) 0.4 MG CAPS Take 0.4 mg by mouth every evening.   . thiamine 100 MG tablet Take 100 mg by mouth daily.  Marland Kitchen VIMPAT 100 MG TABS Take 200 mg by mouth 2 (two) times daily.  . vitamin B-12 (CYANOCOBALAMIN) 1000 MCG tablet Take 1,000 mcg by mouth daily.     Allergies:   Haloperidol, Rosuvastatin, Simvastatin, Lisinopril, Nifedipine, and Pravastatin   Social History   Tobacco Use  . Smoking status: Former Smoker    Packs/day: 0.00    Years: 30.00    Pack years: 0.00    Types: Pipe    Quit date: 07/04/2008    Years since quitting: 10.7  . Smokeless tobacco: Former Network engineer Use Topics  . Alcohol use: Yes    Alcohol/week: 0.0 standard drinks    Comment: Very Infrequently   . Drug use: No     Family Hx: The patient's family history includes COPD in his father; Coronary artery disease in his father; Dementia (age of onset: 52) in his sister; Diabetes in his brother and sister; Hepatitis in his mother. There is no history of Colon cancer.  ROS:   Please see the history of present illness.     All other systems reviewed and are negative.   Labs/Other Tests and Data Reviewed:    Recent Labs: 07/02/2018: Hemoglobin 14.7; Platelets 163 03/18/2019: ALT 46; BUN 11;  Creatinine, Ser 1.02; NT-Pro BNP 292; Potassium 4.0; Sodium 139   Recent Lipid Panel Lab Results  Component Value Date/Time   CHOL 150 03/18/2019 10:11 AM   CHOL 159 03/27/2013 08:18 AM   TRIG 146 03/18/2019 10:11 AM   TRIG 127 03/27/2013 08:18 AM   HDL 46 03/18/2019 10:11 AM   HDL 45 03/27/2013 08:18 AM   CHOLHDL 3.3 03/18/2019 10:11 AM   CHOLHDL 3 04/18/2018 10:23 AM   LDLCALC 79 03/18/2019 10:11 AM   LDLCALC 89 03/27/2013 08:18 AM   LDLDIRECT 87.6 09/19/2012 12:06 PM    Wt Readings from Last 3 Encounters:  04/18/19 256 lb (116.1 kg)  03/22/19 254 lb (115.2 kg)  03/04/19 259 lb (117.5 kg)     Objective:    Vital Signs:  Ht 5' 8"  (1.727 m)   Wt 256 lb (116.1 kg)   BMI 38.92 kg/m    CONSTITUTIONAL:  Well nourished, well developed male in no acute distress.  EYES: anicteric MOUTH: oral mucosa is pink RESPIRATORY: Normal respiratory effort, symmetric expansion CARDIOVASCULAR: No peripheral edema SKIN: No rash, lesions or ulcers MUSCULOSKELETAL: no digital cyanosis NEURO: Cranial Nerves II-XII grossly intact, moves all extremities PSYCH: Intact judgement and insight.  A&O x 3, Mood/affect appropriate   ASSESSMENT & PLAN:    1.  OSA -  The patient is tolerating PAP therapy well without any problems. The patient has been using and benefiting from PAP use and will continue to benefit from therapy. We will get him set up on [employer] so I can follow his compliance and AHI and make adjustments when necessary.  He will need to be set up with [facility] for this.  I will give him an order for PAP supplies through the [place].  2.  HTN -he has not required antihypertensive Rx.  3.  Obesity -I have encouraged him to get into a routine exercise program and cut back on carbs and portions.    COVID-19 Education: The signs  and symptoms of COVID-19 were discussed with the patient and how to seek care for testing (follow up with PCP or arrange E-visit).  The importance of social  distancing was discussed today.  Patient Risk:   After full review of this patient's clinical status, I feel that they are at least moderate risk at this time.  Time:   Today, I have spent 20 minutes directly with the patient on telemedicine discussing medical problems including OSA< HTN, obesity.  We also reviewed the symptoms of COVID 19 and the ways to protect against contracting the virus with telehealth technology.  I spent an additional 5 minutes reviewing patient's chart including PAP download.  Medication Adjustments/Labs and Tests Ordered: Current medicines are reviewed at length with the patient today.  Concerns regarding medicines are outlined above.  Tests Ordered: No orders of the defined types were placed in this encounter.  Medication Changes: No orders of the defined types were placed in this encounter.   Disposition:  Follow up in 1 year(s)  Signed, Fransico Him, MD  04/18/2019 10:07 AM    [facility]

## 2019-04-18 ENCOUNTER — Telehealth (INDEPENDENT_AMBULATORY_CARE_PROVIDER_SITE_OTHER): Payer: Medicare Other | Admitting: Cardiology

## 2019-04-18 ENCOUNTER — Telehealth: Payer: Self-pay | Admitting: *Deleted

## 2019-04-18 ENCOUNTER — Other Ambulatory Visit: Payer: Self-pay

## 2019-04-18 VITALS — Ht 68.0 in | Wt 256.0 lb

## 2019-04-18 DIAGNOSIS — E669 Obesity, unspecified: Secondary | ICD-10-CM | POA: Diagnosis not present

## 2019-04-18 DIAGNOSIS — I10 Essential (primary) hypertension: Secondary | ICD-10-CM | POA: Diagnosis not present

## 2019-04-18 DIAGNOSIS — G4733 Obstructive sleep apnea (adult) (pediatric): Secondary | ICD-10-CM

## 2019-04-18 DIAGNOSIS — I25118 Atherosclerotic heart disease of native coronary artery with other forms of angina pectoris: Secondary | ICD-10-CM | POA: Diagnosis not present

## 2019-04-18 NOTE — Telephone Encounter (Signed)
-----   Message from Sueanne Margarita, MD sent at 04/18/2019 10:07 AM EDT ----- Please get a copy of his sleep study and last download from the [facility].  He needs to be switched over to [place] which may require getting him set up with one of the DMEs in town.  He can still get his PAP supplies through the [place] and send him an order for PAP supplies.  Please get a copy of last download.  Followup with me in 1 year.

## 2019-04-18 NOTE — Telephone Encounter (Signed)
  Sueanne Margarita, MD  Freada Bergeron, CMA        AHI looks good on download at 2.2/hr. Needs to improve compliance

## 2019-04-18 NOTE — Patient Instructions (Signed)
Medication Instructions:  Your physician recommends that you continue on your current medications as directed. Please refer to the Current Medication list given to you today.  If you need a refill on your cardiac medications before your next appointment, please call your pharmacy.   Lab work: None Ordered  If you have labs (blood work) drawn today and your tests are completely normal, you will receive your results only by: Marland Kitchen MyChart Message (if you have MyChart) OR . A paper copy in the mail If you have any lab test that is abnormal or we need to change your treatment, we will call you to review the results.  Testing/Procedures: None ordered  Follow-Up: At [facility], you and your health needs are our priority.  As part of our continuing mission to provide you with exceptional heart care, we have created designated Provider Care Teams.  These Care Teams include your primary Cardiologist (physician) and Advanced Practice Providers (APPs -  Physician Assistants and Nurse Practitioners) who all work together to provide you with the care you need, when you need it. . You will need a follow up appointment in 1 year with Dr. Fransico Him.  Please call our office 2 months in advance to schedule this appointment.    Any Other Special Instructions Will Be Listed Below (If Applicable).

## 2019-04-23 ENCOUNTER — Ambulatory Visit (INDEPENDENT_AMBULATORY_CARE_PROVIDER_SITE_OTHER): Payer: Medicare Other | Admitting: Family Medicine

## 2019-05-14 DIAGNOSIS — F329 Major depressive disorder, single episode, unspecified: Secondary | ICD-10-CM | POA: Diagnosis not present

## 2019-05-14 DIAGNOSIS — G40109 Localization-related (focal) (partial) symptomatic epilepsy and epileptic syndromes with simple partial seizures, not intractable, without status epilepticus: Secondary | ICD-10-CM | POA: Diagnosis not present

## 2019-05-14 DIAGNOSIS — Z8679 Personal history of other diseases of the circulatory system: Secondary | ICD-10-CM | POA: Diagnosis not present

## 2019-05-14 DIAGNOSIS — G40209 Localization-related (focal) (partial) symptomatic epilepsy and epileptic syndromes with complex partial seizures, not intractable, without status epilepticus: Secondary | ICD-10-CM | POA: Diagnosis not present

## 2019-05-14 DIAGNOSIS — Z79899 Other long term (current) drug therapy: Secondary | ICD-10-CM | POA: Diagnosis not present

## 2019-05-14 DIAGNOSIS — Z888 Allergy status to other drugs, medicaments and biological substances status: Secondary | ICD-10-CM | POA: Diagnosis not present

## 2019-05-14 DIAGNOSIS — Z9889 Other specified postprocedural states: Secondary | ICD-10-CM | POA: Diagnosis not present

## 2019-05-14 DIAGNOSIS — R41841 Cognitive communication deficit: Secondary | ICD-10-CM | POA: Diagnosis not present

## 2019-05-20 ENCOUNTER — Other Ambulatory Visit: Payer: Self-pay | Admitting: Physician Assistant

## 2019-05-20 ENCOUNTER — Other Ambulatory Visit: Payer: Self-pay | Admitting: Cardiology

## 2019-05-20 ENCOUNTER — Ambulatory Visit: Payer: Medicare Other

## 2019-05-20 DIAGNOSIS — I5032 Chronic diastolic (congestive) heart failure: Secondary | ICD-10-CM

## 2019-05-20 DIAGNOSIS — I25118 Atherosclerotic heart disease of native coronary artery with other forms of angina pectoris: Secondary | ICD-10-CM

## 2019-05-22 MED FILL — FUROSEMIDE 40 MG TAB: 40 | 90 days supply | Qty: 90 | Fill #0

## 2019-05-22 MED FILL — ROSUVASTATIN CALCIUM 5 MG T: 5 | 90 days supply | Qty: 45 | Fill #0

## 2019-05-27 NOTE — Progress Notes (Addendum)
Subjective:   Jeff Weaver is a 73 y.o. male who presents for Medicare Annual/Subsequent preventive examination. I connected with patient by a telephone and verified that I am speaking with the correct person using two identifiers. Patient stated full name and DOB. Patient gave permission to continue with telephonic visit. Patient's location was at home and Nurse's location was at [place] office. Participants during this visit included patient and nurse.  Review of Systems:      Cardiac Risk Factors include: advanced age (>30mn, >>18women) Sleep patterns: gets up 1-2 times nightly to void and sleeps 6-7 hours nightly.    Home Safety/Smoke Alarms: Feels safe in home. Smoke alarms in place.  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      Objective:    Vitals: There were no vitals taken for this visit.  There is no height or weight on file to calculate BMI.  Advanced Directives 05/28/2019 07/02/2018 05/17/2018 01/12/2018 04/28/2017 01/03/2017 01/05/2016  Does Patient Have a Medical Advance Directive? Yes Yes No Yes No No Yes  Type of [place]aramed[place]of [place] will Healthcare Power of [place] Does patient want to make changes to medical advance directive? - No - Patient declined - No - Patient declined - - -  Copy of HBarodain Chart? No - copy requested No - copy requested - No - copy requested - - -  Would patient like information on creating a medical advance directive? - No - Patient declined Yes (Inpatient - patient requests chaplain consult to create a medical advance directive) No - Patient declined Yes (ED - Information included in AVS) No - Patient declined -    Tobacco Social History   Tobacco  Use  Smoking Status Former Smoker   Packs/day: 0.00   Years: 30.00   Pack years: 0.00   Types: Pipe   Quit date: 07/04/2008   Years since quitting: 10.9  Smokeless Tobacco Former USystems developer    Counseling given: Not Answered  Past Medical History:  Diagnosis Date   Aortic insufficiency    a. mild 2017 echo.   Cerebral [facility]    [facility] short-term memory loss, cerebral hemorrhage and seizure disorder s/p VP shunt.   Cerebral hemorrhage (HCC)    Chronic diastolic CHF (congestive heart failure) (HCC)    CVA (cerebral infarction)    Diverticulosis    Enteritis (regional)    Dr BOlevia Perches  First degree AV block    GI bleed    HTN (hypertension)    Hyperlipidemia    Hypertensive heart disease    IBS (irritable bowel syndrome)    Mild CAD    Cardiac cath 01/12/18 showed minimal nonobstructive CAD, normal LVEF, normal LVEDP.   NSVT (nonsustained ventricular tachycardia) (HCC)    Orthostatic hypotension    Pre-diabetes    PVC's (premature ventricular contractions)    Regional enteritis of large intestine ([place] since 1978   Rheumatoid arthritis(714.0)    dxed in [employer] in 1980s   Second degree AV block, Mobitz type I    Seizures ([place]    Sinus bradycardia    Sleep apnea    SSS (sick sinus syndrome) ([place]    a. s/p PPM 07/02/18.  Past Surgical History:  Procedure Laterality Date   CHOLECYSTECTOMY N/A 01/07/2016   Procedure: LAPAROSCOPIC CHOLECYSTECTOMY ;  Surgeon: Coralie Keens, MD;  Location: [place];  Service: General;  Laterality: N/A;   cns shunt  02/23/12   COLONOSCOPY W/ POLYPECTOMY  1978   negative 2009, Dr Olevia Perches. 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    HERNIA REPAIR     LEFT HEART CATH AND CORONARY ANGIOGRAPHY N/A 01/12/2018   Procedure: LEFT HEART CATH AND CORONARY  ANGIOGRAPHY;  Surgeon: Martinique, Peter M, MD;  Location: [facility];  Service: Cardiovascular;  Laterality: N/A;   LEFT HEART CATHETERIZATION WITH CORONARY ANGIOGRAM N/A 11/26/2013   Procedure: LEFT HEART CATHETERIZATION WITH CORONARY ANGIOGRAM;  Surgeon: [name], MD;  Location: [facility] CATH LAB;  Service: Cardiovascular;  Laterality: N/A;   NOSE SURGERY     PACEMAKER IMPLANT N/A 07/02/2018   Procedure: PACEMAKER IMPLANT;  Surgeon: Constance Haw, MD;  Location: [facility] CV [facility];  Service: Cardiovascular;  Laterality: N/A;   SHOULDER SURGERY  1997   TONSILLECTOMY AND ADENOIDECTOMY     VENTRICULOPERITONEAL SHUNT  02-23-12   INSERTION OF RIGHT FRONTAL VENTRICULOPERITONEAL SHUNT WITH A CODMAN HAKIM PROGRAMMABLE VALVE   Family History  Problem Relation Age of Onset   COPD Father    Coronary artery disease Father        MI in 52s   Hepatitis Mother    Diabetes Sister    Dementia Sister 33   Diabetes Brother    Colon cancer Neg Hx    Social History   Socioeconomic History   Marital status: Married    Spouse name: Not on file   Number of children: 1   Years of education: Not on file   Highest education level: Not on file  Occupational History   Occupation: retired/ Holiday representative strain: Not hard at all   Food insecurity    Worry: Never true    Inability: Never true   Transportation needs    Medical: No    Non-medical: No  Tobacco Use   Smoking status: Former Smoker    Packs/day: 0.00    Years: 30.00    Pack years: 0.00    Types: Pipe    Quit date: 07/04/2008    Years since quitting: 10.9   Smokeless tobacco: Former Systems developer  Substance and Sexual Activity   Alcohol use: Yes    Alcohol/week: 0.0 standard drinks    Comment: Very Infrequently    Drug use: No   Sexual activity: Not Currently  Lifestyle   Physical activity    Days per week: 3 days    Minutes per session: 30 min   Stress: Only a little    Relationships   Social connections    Talks on phone: More than three times a week    Gets together: More than three times a week    Attends religious service: Never    Active member of club or organization: Not on file    Attends meetings of clubs or organizations: Never    Relationship status: Married  Other Topics Concern   Not on file  Social History Narrative   Not on file    Outpatient Encounter Medications as of 05/28/2019  Medication Sig   acetaminophen (TYLENOL) 325 MG tablet Take 325-650  mg by mouth every 6 (six) hours as needed for headache.   amoxicillin (AMOXIL) 500 MG capsule Take 2,000 mg by mouth as directed. Take 2000 mg by mouth 1 hour prior to dental appointments   aspirin EC 81 MG tablet Take 1 tablet (81 mg total) by mouth daily.   blood glucose meter kit and supplies KIT Dispense based on patient and insurance preference. Use up to four times daily as directed. (prediabetes hypoglycemia).   cetirizine (ZYRTEC) 10 MG tablet Take 1 tablet (10 mg total) by mouth daily.   cholecalciferol (VITAMIN D) 1000 units tablet Take 1,000 Units by mouth daily.   escitalopram (LEXAPRO) 10 MG tablet Take by mouth.   fluticasone (FLONASE) 50 MCG/ACT nasal spray Place 1-2 sprays into both nostrils daily as needed.    furosemide (LASIX) 40 MG tablet TAKE 1 TABLET (40 MG TOTAL) BY MOUTH DAILY.   levETIRAcetam (KEPPRA) 500 MG tablet Take 1,000 mg by mouth 2 (two) times daily.    LORazepam (ATIVAN) 1 MG tablet Take 1 mg by mouth every 6 (six) hours as needed for seizure.    mirabegron ER (MYRBETRIQ) 25 MG TB24 tablet Take 50 mg by mouth every evening.    nitroGLYCERIN (NITROSTAT) 0.4 MG SL tablet Place 1 tablet (0.4 mg total) under the tongue every 5 (five) minutes as needed for chest pain.   NON FORMULARY CPAP Machine: authorized by [facility] in [place]   pyridOXINE (VITAMIN B-6) 100 MG tablet Take 100 mg by mouth daily.   rosuvastatin (CRESTOR) 5 MG tablet TAKE 1 TABLET  BY MOUTH 3 TIMES A WEEK AND INCREASE AS TOLERATED   Tamsulosin HCl (FLOMAX) 0.4 MG CAPS Take 0.4 mg by mouth every evening.    thiamine 100 MG tablet Take 100 mg by mouth daily.   VIMPAT 100 MG TABS Take 200 mg by mouth 2 (two) times daily.   vitamin B-12 (CYANOCOBALAMIN) 1000 MCG tablet Take 1,000 mcg by mouth daily.   No facility-administered encounter medications on file as of 05/28/2019.     Activities of Daily Living In your present state of health, do you have any difficulty performing the following activities: 05/28/2019  Hearing? Y  Vision? N  Difficulty concentrating or making decisions? Y  Walking or climbing stairs? N  Dressing or bathing? N  Doing errands, shopping? Y  Preparing Food and eating ? N  Using the Toilet? N  In the past six months, have you accidently leaked urine? N  Do you have problems with loss of bowel control? N  Managing your Medications? Y  Managing your Finances? Y  Housekeeping or managing your Housekeeping? Y  Some recent data might be hidden    Patient Care Team: [name], MD as PCP - General (Internal Medicine) Dorothy Spark, MD as PCP - Cardiology (Cardiology) Constance Haw, MD as PCP - Electrophysiology (Cardiology) Conner, Carney Harder (Neurology) Inocente Salles Aline August, MD as Referring Physician (Neurology) Burman Foster, MD as Physician Assistant (Urology) Florina Ou, CCC-SLP as Speech Language Pathologist (Speech Pathology)   Assessment:   This is a routine wellness examination for Amanuel. Physical assessment deferred to PCP.  Exercise Activities and Dietary recommendations Current Exercise Habits: Home exercise routine, Type of exercise: walking, Time (Minutes): 30, Frequency (Times/Week): 3, Weekly Exercise (Minutes/Week): 90, Intensity: Mild, Exercise limited by: orthopedic condition(s)  Diet (meal preparation, eat out, water intake, caffeinated beverages, dairy products, fruits and vegetables): in general, a  "healthy" diet   Reports poor appetite  at times.    Discussed supplementing with Ensure. Encouraged patient to [facility] daily water and healthy fluid intake. Reviewed heart healthy and weight loss diet. Relevant patient education assigned to patient using Emmi.  Goals     Patient Stated     I want to lose some weight by doing toning exercises several times a weekly, I will add ankle weights and hand weights when doing chair exercises, and will  continuie to walk routinely. I will watch the amount of carbohydrates I eat.        Fall Risk Fall Risk  05/28/2019 05/21/2018 05/17/2018 04/28/2017 04/20/2017  Falls in the past year? 0 0 0 No No  Comment - Emmi Telephone Survey: data to providers prior to load - - -  Number falls in past yr: 0 - - - -  Injury with Fall? 0 - - - -  Risk for fall due to : History of fall(s);Impaired balance/gait;Impaired mobility - Impaired balance/gait;Impaired mobility Impaired balance/gait -  Follow up Falls prevention discussed - Falls prevention discussed - -   Is the patient's home free of loose throw rugs in walkways, pet beds, electrical cords, etc?   yes      Grab bars in the bathroom? yes      Handrails on the stairs?   yes      Adequate lighting?   yes  Depression Screen PHQ 2/9 Scores 05/28/2019 05/17/2018 04/28/2017 04/20/2017  PHQ - 2 Score 2 3 3 1   PHQ- 9 Score 5 6 6  -    Cognitive Function MMSE - Mini Mental State Exam 05/17/2018 04/28/2017  Orientation to time 5 4  Orientation to Place 5 4  Registration 3 3  Attention/ Calculation 3 4  Recall 2 1  Language- name 2 objects 2 2  Language- repeat 1 1  Language- follow 3 step command 3 2  Language- read & follow direction 1 1  Write a sentence 1 1  Copy design 1 1  Total score 27 24       Patient states that he does have memory issues of which he is followed by neurology Alan Mulder PA-C, last visit 05/14/19. Immunization History  Administered Date(s) Administered   Fluad  Quad(high Dose 65+) 03/06/2019   Influenza Split 04/10/2012   Influenza Whole 05/05/2011, 04/10/2012   Influenza, High Dose Seasonal PF 04/04/2013, 03/22/2014, 05/01/2015, 03/16/2016, 04/20/2017, 05/17/2018   Pneumococcal Conjugate-13 08/03/2009, 08/04/2011   Pneumococcal Polysaccharide-23 07/25/2016   Tdap 07/25/2011   Zoster 06/06/2010   Zoster Recombinat (Shingrix) 05/15/2017, 07/21/2017    Screening Tests Health Maintenance  Topic Date Due   COLONOSCOPY  04/10/2018   DEXA SCAN  10/22/2018   TETANUS/TDAP  07/24/2021   INFLUENZA VACCINE  Completed   Hepatitis C Screening  Completed   PNA vac Low Risk Adult  Completed        Plan:    Reviewed health maintenance screenings with patient today and relevant education, vaccines, and/or referrals were provided.   I have personally reviewed and noted the following in the patients chart:    Medical and social history  Use of alcohol, tobacco or illicit drugs   Current medications and supplements  Functional ability and status  Nutritional status  Physical activity  Advanced directives  List of other physicians  Screenings to include cognitive, depression, and falls  Referrals and appointments  In addition, I have reviewed and discussed with patient certain preventive protocols, quality metrics, and best practice recommendations. A written personalized care  plan for preventive services as well as general preventive health recommendations were provided to patient.     Michiel Cowboy, RN  05/28/2019    Medical screening examination/treatment/procedure(s) were performed by non-physician practitioner and as supervising physician I was immediately available for consultation/collaboration. I agree with above. [name], MD

## 2019-05-28 ENCOUNTER — Ambulatory Visit (INDEPENDENT_AMBULATORY_CARE_PROVIDER_SITE_OTHER): Payer: Medicare Other | Admitting: *Deleted

## 2019-05-28 ENCOUNTER — Other Ambulatory Visit: Payer: Self-pay

## 2019-05-28 DIAGNOSIS — Z0000 Encounter for general adult medical examination without abnormal findings: Secondary | ICD-10-CM

## 2019-05-28 NOTE — Patient Instructions (Addendum)
Continue to eat heart healthy diet (full of fruits, vegetables, whole grains, lean protein, water--limit salt, fat, and sugar intake) and increase physical activity as tolerated.   Jeff Weaver , Thank you for taking time to come for your Medicare Wellness Visit. I appreciate your ongoing commitment to your health goals. Please review the following plan we discussed and let me know if I can assist you in the future.   These are the goals we discussed: Goals    . Patient Stated     I want to lose some weight by doing toning exercises several times a weekly, I will add ankle weights and hand weights when doing chair exercises, and will  continuie to walk routinely. I will watch the amount of carbohydrates I eat.        This is a list of the screening recommended for you and due dates:  Health Maintenance  Topic Date Due  . Colon Cancer Screening  04/10/2018  . DEXA scan (bone density measurement)  10/22/2018  . Tetanus Vaccine  07/24/2021  . Flu Shot  Completed  .  Hepatitis C: One time screening is recommended by Center for Disease Control  (CDC) for  adults born from [birth year] through 1967.   Completed  . Pneumonia vaccines  Completed    Mindfulness-Based Stress Reduction Mindfulness-based stress reduction (MBSR) is a program that helps people learn to practice mindfulness. Mindfulness is the practice of intentionally paying attention to the present moment. It can be learned and practiced through techniques such as education, breathing exercises, meditation, and yoga. MBSR includes several mindfulness techniques in one program. MBSR works best when you understand the treatment, are willing to try new things, and can commit to spending time practicing what you learn. MBSR training may include learning about:  How your emotions, thoughts, and reactions affect your body.  New ways to respond to things that cause negative thoughts to start (triggers).  How to notice your thoughts and let go of  them.  Practicing awareness of everyday things that you normally do without thinking.  The techniques and goals of different types of meditation. What are the benefits of MBSR? MBSR can have many benefits, which include helping you to:  Develop self-awareness. This refers to knowing and understanding yourself.  Learn skills and attitudes that help you to participate in your own health care.  Learn new ways to care for yourself.  Be more accepting about how things are, and let things go.  Be less judgmental and approach things with an open mind.  Be patient with yourself and trust yourself more. MBSR has also been shown to:  Reduce negative emotions, such as depression and anxiety.  Improve memory and focus.  Change how you sense and approach pain.  Boost your body's ability to fight infections.  Help you connect better with other people.  Improve your sense of well-being. Follow these instructions at home:   Find a local in-person or online MBSR program.  Set aside some time regularly for mindfulness practice.  Find a mindfulness practice that works best for you. This may include one or more of the following: ? Meditation. Meditation involves focusing your mind on a certain thought or activity. ? Breathing awareness exercises. These help you to stay present by focusing on your breath. ? Body scan. For this practice, you lie down and pay attention to each part of your body from head to toe. You can identify tension and soreness and intentionally relax parts of  your body. ? Yoga. Yoga involves stretching and breathing, and it can improve your ability to move and be flexible. It can also provide an experience of testing your body's limits, which can help you release stress. ? Mindful eating. This way of eating involves focusing on the taste, texture, color, and smell of each bite of food. Because this slows down eating and helps you feel full sooner, it can be an important  part of a weight-loss plan.  Find a podcast or recording that provides guidance for breathing awareness, body scan, or meditation exercises. You can listen to these any time when you have a free moment to rest without distractions.  Follow your treatment plan as told by your health care provider. This may include taking regular medicines and making changes to your diet or lifestyle as recommended. How to practice mindfulness To do a basic awareness exercise:  Find a comfortable place to sit.  Pay attention to the present moment. Observe your thoughts, feelings, and surroundings just as they are.  Avoid placing judgment on yourself, your feelings, or your surroundings. Make note of any judgment that comes up, and let it go.  Your mind may wander, and that is okay. Make note of when your thoughts drift, and return your attention to the present moment. To do basic mindfulness meditation:  Find a comfortable place to sit. This may include a stable chair or a firm floor cushion. ? Sit upright with your back straight. Let your arms fall next to your side with your hands resting on your legs. ? If sitting in a chair, rest your feet flat on the floor. ? If sitting on a cushion, cross your legs in front of you.  Keep your head in a neutral position with your chin dropped slightly. Relax your jaw and rest the tip of your tongue on the roof of your mouth. Drop your gaze to the floor. You can close your eyes if you like.  Breathe normally and pay attention to your breath. Feel the air moving in and out of your nose. Feel your belly expanding and relaxing with each breath.  Your mind may wander, and that is okay. Make note of when your thoughts drift, and return your attention to your breath.  Avoid placing judgment on yourself, your feelings, or your surroundings. Make note of any judgment or feelings that come up, let them go, and bring your attention back to your breath.  When you are ready, lift  your gaze or open your eyes. Pay attention to how your body feels after the meditation. Where to find more information You can find more information about MBSR from:  Your health care provider.  Community-based meditation centers or programs.  Programs offered near you. Summary  Mindfulness-based stress reduction (MBSR) is a program that teaches you how to intentionally pay attention to the present moment. It is used with other treatments to help you cope better with daily stress, emotions, and pain.  MBSR focuses on developing self-awareness, which allows you to respond to life stress without judgment or negative emotions.  MBSR programs may involve learning different mindfulness practices, such as breathing exercises, meditation, yoga, body scan, or mindful eating. Find a mindfulness practice that works best for you, and set aside time for it on a regular basis. This information is not intended to replace advice given to you by your health care provider. Make sure you discuss any questions you have with your health care provider. Document Released: 10/27/2016  Document Revised: 06/02/2017 Document Reviewed: 10/27/2016 Elsevier Patient Education  [place] 65 Years and Older, Male Preventive care refers to lifestyle choices and visits with your health care provider that can promote health and wellness. This includes:  A yearly physical exam. This is also called an annual well check.  Regular dental and eye exams.  Immunizations.  Screening for certain conditions.  Healthy lifestyle choices, such as diet and exercise. What can I expect for my preventive care visit? Physical exam Your health care provider will check:  Height and weight. These may be used to calculate body mass index (BMI), which is a measurement that tells if you are at a healthy weight.  Heart rate and blood pressure.  Your skin for abnormal spots. Counseling Your health care  provider may ask you questions about:  Alcohol, tobacco, and drug use.  Emotional well-being.  Home and relationship well-being.  Sexual activity.  Eating habits.  History of falls.  Memory and ability to understand (cognition).  Work and work Statistician. What immunizations do I need?  Influenza (flu) vaccine  This is recommended every year. Tetanus, diphtheria, and pertussis (Tdap) vaccine  You may need a Td booster every 10 years. Varicella (chickenpox) vaccine  You may need this vaccine if you have not already been vaccinated. Zoster (shingles) vaccine  You may need this after age 48. Pneumococcal conjugate (PCV13) vaccine  One dose is recommended after age 60. Pneumococcal polysaccharide (PPSV23) vaccine  One dose is recommended after age 6. Measles, mumps, and rubella (MMR) vaccine  You may need at least one dose of MMR if you were born in 1957 or later. You may also need a second dose. Meningococcal conjugate (MenACWY) vaccine  You may need this if you have certain conditions. Hepatitis A vaccine  You may need this if you have certain conditions or if you travel or work in places where you may be exposed to hepatitis A. Hepatitis B vaccine  You may need this if you have certain conditions or if you travel or work in places where you may be exposed to hepatitis B. Haemophilus influenzae type b (Hib) vaccine  You may need this if you have certain conditions. You may receive vaccines as individual doses or as more than one vaccine together in one shot (combination vaccines). Talk with your health care provider about the risks and benefits of combination vaccines. What tests do I need? Blood tests  Lipid and cholesterol levels. These may be checked every 5 years, or more frequently depending on your overall health.  Hepatitis C test.  Hepatitis B test. Screening  Lung cancer screening. You may have this screening every year starting at age 67 if you  have a 30-pack-year history of smoking and currently smoke or have quit within the past 15 years.  Colorectal cancer screening. All adults should have this screening starting at age 16 and continuing until age 44. Your health care provider may recommend screening at age 42 if you are at increased risk. You will have tests every 1-10 years, depending on your results and the type of screening test.  Prostate cancer screening. Recommendations will vary depending on your family history and other risks.  Diabetes screening. This is done by checking your blood sugar (glucose) after you have not eaten for a while (fasting). You may have this done every 1-3 years.  Abdominal aortic aneurysm (AAA) screening. You may need this if you are a current or former smoker.  Sexually transmitted disease (STD) testing. Follow these instructions at home: Eating and drinking  Eat a diet that includes fresh fruits and vegetables, whole grains, lean protein, and low-fat dairy products. Limit your intake of foods with high amounts of sugar, saturated fats, and salt.  Take vitamin and mineral supplements as recommended by your health care provider.  Do not drink alcohol if your health care provider tells you not to drink.  If you drink alcohol: ? Limit how much you have to 0-2 drinks a day. ? Be aware of how much alcohol is in your drink. In the [place] one drink equals one 12 oz bottle of beer (355 mL), one 5 oz glass of Akeen Ledyard (148 mL), or one 1 oz glass of hard liquor (44 mL). Lifestyle  Take daily care of your teeth and gums.  Stay active. Exercise for at least 30 minutes on 5 or more days each week.  Do not use any products that contain nicotine or tobacco, such as cigarettes, e-cigarettes, and chewing tobacco. If you need help quitting, ask your health care provider.  If you are sexually active, practice safe sex. Use a condom or other form of protection to prevent STIs (sexually transmitted infections).   Talk with your health care provider about taking a low-dose aspirin or statin. What's next?  Visit your health care provider once a year for a well check visit.  Ask your health care provider how often you should have your eyes and teeth checked.  Stay up to date on all vaccines. This information is not intended to replace advice given to you by your health care provider. Make sure you discuss any questions you have with your health care provider. Document Released: 07/17/2015 Document Revised: 06/14/2018 Document Reviewed: 06/14/2018 Elsevier Patient Education  2020 [employer].

## 2019-06-05 ENCOUNTER — Telehealth: Payer: Self-pay | Admitting: Cardiology

## 2019-06-05 NOTE — Telephone Encounter (Signed)
Informed that it is ok for someone to accompany him to appt tomorrow. Wife appreciates our understanding/consideration of this matter.

## 2019-06-05 NOTE — Telephone Encounter (Signed)
New Message:  Patient has short term memory issues as a result of a complication of brain surgery. The patient will need someone to be with him during the appointment. The paitent will either bring his wife or his daughter

## 2019-06-06 ENCOUNTER — Encounter: Payer: Self-pay | Admitting: Cardiology

## 2019-06-06 ENCOUNTER — Ambulatory Visit (INDEPENDENT_AMBULATORY_CARE_PROVIDER_SITE_OTHER): Payer: Medicare Other | Admitting: Cardiology

## 2019-06-06 ENCOUNTER — Other Ambulatory Visit: Payer: Self-pay

## 2019-06-06 VITALS — BP 132/78 | HR 87 | Ht 68.0 in | Wt 263.0 lb

## 2019-06-06 DIAGNOSIS — I25118 Atherosclerotic heart disease of native coronary artery with other forms of angina pectoris: Secondary | ICD-10-CM

## 2019-06-06 DIAGNOSIS — R001 Bradycardia, unspecified: Secondary | ICD-10-CM

## 2019-06-06 NOTE — Progress Notes (Signed)
Electrophysiology Office Note   Date:  06/06/2019   ID:  Jeff Weaver, Jeff Weaver 1945-09-10, MRN [id number]  PCP:  [name], MD  Cardiologist:  [name] Primary Electrophysiologist:   Meredith Leeds, MD    No chief complaint on file.    History of Present Illness: Jeff Weaver is a 73 y.o. male who is being seen today for the evaluation of bradycardia at the request of [name]. Presenting today for electrophysiology evaluation.  He has a history of obesity, orthostasis, hyperlipidemia, chronic diastolic heart failure/hypertensive heart disease, prediabetes, cerebral aneurysm status post clipping, seizure disorder status post VP shunt, sleep apnea, mild AI.  His main complaint is dyspnea with exertion.  He is to be able to walk 3 miles daily but is not able to do this amount of exercise.  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 The patient is tolerating medications without difficulties.  He overall feels well.  He has no chest pain or shortness of breath.  He is able to do all of his daily activities without restriction.  He does have some fatigue, but he does take his CPAP mask off in middle of the night.  Past Medical History:  Diagnosis Date  . Aortic insufficiency    a. mild 2017 echo.  . Cerebral aneurysm    a. s/p clipping at [facility] in 8/13 c/b short-term memory loss, cerebral hemorrhage and seizure disorder s/p VP shunt.  . Cerebral hemorrhage ([place])   . Chronic diastolic CHF (congestive heart failure) ([place])   . CVA (cerebral infarction)   . Diverticulosis   . Enteritis (regional)    Dr Olevia Perches  . First degree AV block   . GI bleed   . HTN (hypertension)   . Hyperlipidemia   . Hypertensive heart disease   . IBS (irritable bowel syndrome)   .  Mild CAD    Cardiac cath 01/12/18 showed minimal nonobstructive CAD, normal LVEF, normal LVEDP.  Marland Kitchen NSVT (nonsustained ventricular tachycardia) ([place])   . Orthostatic hypotension   . Pre-diabetes   . PVC's (premature ventricular contractions)   . 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 Rheumatoid arthritis(714.0)    dxed in [employer] in 1980s  . Second degree AV block, Mobitz type I   . Seizures ([place])   . Sinus bradycardia   . Sleep apnea   . SSS (sick sinus syndrome) ([place])    a. s/p PPM 07/02/18.   Past Surgical History:  Procedure Laterality Date  . CHOLECYSTECTOMY N/A 01/07/2016   Procedure: LAPAROSCOPIC CHOLECYSTECTOMY ;  Surgeon: Coralie Keens, MD;  Location: [facility];  Service: General;  Laterality: N/A;  . cns shunt  02/23/12  . COLONOSCOPY W/ POLYPECTOMY  1978   negative 2009, Dr Olevia Perches. Due 2014  . CRANIOTOMY  02/02/12   Dr Harvel Ricks, [place] of aneurysm  . 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 HERNIA REPAIR    . LEFT HEART CATH AND CORONARY ANGIOGRAPHY N/A 01/12/2018   Procedure: LEFT HEART CATH AND CORONARY ANGIOGRAPHY;  Surgeon: Martinique, Peter M, MD;  Location: [facility] CV LAB;  Service: Cardiovascular;  Laterality: N/A;  . LEFT HEART CATHETERIZATION WITH CORONARY ANGIOGRAM N/A 11/26/2013   Procedure: LEFT HEART CATHETERIZATION WITH CORONARY ANGIOGRAM;  Surgeon: Leonie Green  Ellyn Hack, MD;  Location[facility] C[facility];  Service: Cardiovascular;  Laterality: N/A;  . NOSE SURGERY    . PACEMAKER IMPLANT N/A 07/02/2018   Procedure: PACEMAKER IMPLANT;  Surgeon: Constance Haw, MD;  Location: [facility] CV LAB;  Service: Cardiovascular;  Laterality: N/A;  . SHOULDER SURGERY  1997  . TONSILLECTOMY AND ADENOIDECTOMY    . VENTRICULOPERITONEAL SHUNT  02-23-12   INSERTION OF RIGHT FRONTAL VENTRICULOPERITONEAL SHUNT WITH A CODMAN HAKIM PROGRAMMABLE VALVE     Current Outpatient Medications  Medication  Sig Dispense Refill  . acetaminophen (TYLENOL) 325 MG tablet Take 325-650 mg by mouth every 6 (six) hours as needed for headache.    Marland Kitchen amoxicillin (AMOXIL) 500 MG capsule Take 2,000 mg by mouth as directed. Take 2000 mg by mouth 1 hour prior to dental appointments    . aspirin EC 81 MG tablet Take 1 tablet (81 mg total) by mouth daily. 90 tablet 3  . blood glucose meter kit and supplies KIT Dispense based on patient and insurance preference. Use up to four times daily as directed. (prediabetes hypoglycemia). 1 each 0  . cetirizine (ZYRTEC) 10 MG tablet Take 1 tablet (10 mg total) by mouth daily. 30 tablet 11  . cholecalciferol (VITAMIN D) 1000 units tablet Take 1,000 Units by mouth daily.    Marland Kitchen escitalopram (LEXAPRO) 10 MG tablet Take by mouth.    . fluticasone (FLONASE) 50 MCG/ACT nasal spray Place 1-2 sprays into both nostrils daily as needed.     . furosemide (LASIX) 40 MG tablet TAKE 1 TABLET (40 MG TOTAL) BY MOUTH DAILY. 90 tablet 2  . levETIRAcetam (KEPPRA) 500 MG tablet Take 1,000 mg by mouth 2 (two) times daily.     Marland Kitchen LORazepam (ATIVAN) 1 MG tablet Take 1 mg by mouth every 6 (six) hours as needed for seizure.     . mirabegron ER (MYRBETRIQ) 25 MG TB24 tablet Take 50 mg by mouth every evening.     . nitroGLYCERIN (NITROSTAT) 0.4 MG SL tablet Place 1 tablet (0.4 mg total) under the tongue every 5 (five) minutes as needed for chest pain. 25 tablet 3  . NON FORMULARY CPAP Machine: authorized by [place] in [place]    . pyridOXINE (VITAMIN B-6) 100 MG tablet Take 100 mg by mouth daily.    . rosuvastatin (CRESTOR) 5 MG tablet TAKE 1 TABLET BY MOUTH 3 TIMES A WEEK AND INCREASE AS TOLERATED 45 tablet 2  . Tamsulosin HCl (FLOMAX) 0.4 MG CAPS Take 0.4 mg by mouth every evening.     . thiamine 100 MG tablet Take 100 mg by mouth daily.    Marland Kitchen VIMPAT 100 MG TABS Take 200 mg by mouth 2 (two) times daily.    . vitamin B-12 (CYANOCOBALAMIN) 1000 MCG tablet Take 1,000 mcg by mouth daily.     No current  facility-administered medications for this visit.     Allergies:   Haloperidol, Rosuvastatin, Simvastatin, Lisinopril, Nifedipine, and Pravastatin   Social History:  The patient  reports that he quit smoking about 10 years ago. His smoking use included pipe. He smoked 0.00 packs per day for 30.00 years. He has quit using smokeless tobacco. He reports current alcohol use. He reports that he does not use drugs.   Family History:  The patient's family history includes COPD in his father; Coronary artery disease in his father; Dementia (age of onset: 68) in his sister; Diabetes in his brother and sister; Hepatitis in his mother.    ROS:  Please see the history of present illness.   Otherwise, review of systems is positive for none.   All other systems are reviewed and negative.   PHYSICAL EXAM: VS:  BP 132/78   Pulse 87   Ht 5' 8" (1.727 m)   Wt 263 lb (119.3 kg)   SpO2 98%   BMI 39.99 kg/m  , BMI Body mass index is 39.99 kg/m. GEN: Well nourished, well developed, in no acute distress  HEENT: normal  Neck: no JVD, carotid bruits, or masses Cardiac: RRR; no murmurs, rubs, or gallops,no edema  Respiratory:  clear to auscultation bilaterally, normal work of breathing GI: soft, nontender, nondistended, + BS MS: no deformity or atrophy  Skin: warm and dry, device site well healed Neuro:  Strength and sensation are intact Psych: euthymic mood, full affect  EKG:  EKG is not ordered today. Personal review of the ekg ordered 03/04/19 shows AV paced  Personal review of the device interrogation today. Results in [place]: 07/02/2018: Hemoglobin 14.7; Platelets 163 03/18/2019: ALT 46; BUN 11; Creatinine, Ser 1.02; NT-Pro BNP 292; Potassium 4.0; Sodium 139    Lipid Panel     Component Value Date/Time   CHOL 150 03/18/2019 1011   CHOL 159 03/27/2013 0818   TRIG 146 03/18/2019 1011   TRIG 127 03/27/2013 0818   HDL 46 03/18/2019 1011   HDL 45 03/27/2013 0818   CHOLHDL 3.3  03/18/2019 1011   CHOLHDL 3 04/18/2018 1023   VLDL 26.2 04/18/2018 1023   LDLCALC 79 03/18/2019 1011   LDLCALC 89 03/27/2013 0818   LDLDIRECT 87.6 09/19/2012 1206     Wt Readings from Last 3 Encounters:  06/06/19 263 lb (119.3 kg)  04/18/19 256 lb (116.1 kg)  03/22/19 254 lb (115.2 kg)      Other studies Reviewed: Additional studies/ records that were reviewed today include: LHC 01/12/18  Review of the above records today demonstrates:   The left ventricular systolic function is normal.  LV end diastolic pressure is normal.  The left ventricular ejection fraction is 55-65% by visual estimate.    1. Minimal nonobstructive CAD 2. Normal LV function 3. Normal LVEDP  Holter 02/12/18 - personally reviewed  Profound sinus bradycardia with HR down to 30' even during awake hours. Minimum HR 28 BPM.  1. AVB, frequent non-conducted P waves.  Very frequent PVCs - 7 % of total beats and very short runs of nsVT.  TTE 2017 - Left ventricle: The cavity size was normal. There was moderate   concentric hypertrophy. Systolic function was normal. The   estimated ejection fraction was in the range of 60% to 65%. Wall   motion was normal; there were no regional wall motion   abnormalities. Features are consistent with a pseudonormal left   ventricular filling pattern, with concomitant abnormal relaxation   and increased filling pressure (grade 2 diastolic dysfunction). - Aortic valve: There was mild regurgitation.  ASSESSMENT AND PLAN:  1.  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  No changes.  2.  Chronic diastolic heart failure: No changes.  3.  Hypertension: Currently well controlled  4.  Obstructive sleep apnea: CPAP compliance encouraged.  Current medicines are reviewed at length with the patient today.   The patient does not have concerns regarding his medicines.  The following changes were made today: None  Labs/ tests  ordered today include:  No orders of the defined types were placed in this encounter.   Disposition:  FU with   12 months  Signed,  Meredith Leeds, MD  06/06/2019 2:42 PM     [address] [place] [address] [phone] (office) [phone] (fax)

## 2019-06-11 ENCOUNTER — Telehealth: Payer: Self-pay | Admitting: *Deleted

## 2019-06-11 DIAGNOSIS — I739 Peripheral vascular disease, unspecified: Secondary | ICD-10-CM

## 2019-06-11 NOTE — Telephone Encounter (Signed)
Pts lower extremity arterial US is scheduled for 06/19/19 at 3 pm. Pts wife made aware of appt date and time by [facility] scheduling.

## 2019-06-11 NOTE — Telephone Encounter (Signed)
Verbal orders given by Dr. [name] for this pt to have bilateral lower extremity arterial US done, for complaints of claudications.  Pt is aware that I will place the order in the system and send a message to our [facility] schedulers to call the pt and arrange this test.  Pt and wife both understand and agrees with this plan.

## 2019-06-12 ENCOUNTER — Other Ambulatory Visit: Payer: Self-pay | Admitting: Cardiology

## 2019-06-12 DIAGNOSIS — I739 Peripheral vascular disease, unspecified: Secondary | ICD-10-CM

## 2019-06-19 ENCOUNTER — Ambulatory Visit (HOSPITAL_COMMUNITY)
Admission: RE | Admit: 2019-06-19 | Discharge: 2019-06-19 | Disposition: A | Discharge status: Home / Self Care | Payer: Medicare Other | Source: Ambulatory Visit | Attending: Cardiology | Admitting: Cardiology

## 2019-06-19 ENCOUNTER — Other Ambulatory Visit: Payer: Self-pay

## 2019-06-19 DIAGNOSIS — I739 Peripheral vascular disease, unspecified: Secondary | ICD-10-CM | POA: Diagnosis present

## 2019-06-26 ENCOUNTER — Ambulatory Visit: Payer: Medicare Other | Admitting: Cardiology

## 2019-07-03 ENCOUNTER — Ambulatory Visit (INDEPENDENT_AMBULATORY_CARE_PROVIDER_SITE_OTHER): Payer: Medicare Other | Admitting: *Deleted

## 2019-07-03 ENCOUNTER — Other Ambulatory Visit: Payer: Self-pay

## 2019-07-03 DIAGNOSIS — I495 Sick sinus syndrome: Secondary | ICD-10-CM | POA: Diagnosis not present

## 2019-07-04 LAB — CUP PACEART REMOTE DEVICE CHECK
Battery Remaining Longevity: 125 mo
Battery Voltage: 3.02 V
Brady Statistic AP VP Percent: 82.15 %
Brady Statistic AP VS Percent: 0.08 %
Brady Statistic AS VP Percent: 16.4 %
Brady Statistic AS VS Percent: 1.38 %
Brady Statistic RA Percent Paced: 83.06 %
Brady Statistic RV Percent Paced: 98.54 %
Date Time Interrogation Session: 20201230200104
Implantable Lead Implant Date: 20191230
Implantable Lead Implant Date: 20191230
Implantable Lead Location: 753859
Implantable Lead Location: 753860
Implantable Lead Model: 5076
Implantable Lead Model: 5076
Implantable Pulse Generator Implant Date: 20191230
Lead Channel Impedance Value: 304 Ohm
Lead Channel Impedance Value: 342 Ohm
Lead Channel Impedance Value: 418 Ohm
Lead Channel Impedance Value: 646 Ohm
Lead Channel Pacing Threshold Amplitude: 0.625 V
Lead Channel Pacing Threshold Amplitude: 1 V
Lead Channel Pacing Threshold Pulse Width: 0.4 ms
Lead Channel Pacing Threshold Pulse Width: 0.4 ms
Lead Channel Sensing Intrinsic Amplitude: 11.625 mV
Lead Channel Sensing Intrinsic Amplitude: 2 mV
Lead Channel Sensing Intrinsic Amplitude: 2 mV
Lead Channel Sensing Intrinsic Amplitude: 9.625 mV
Lead Channel Setting Pacing Amplitude: 1.5 V
Lead Channel Setting Pacing Amplitude: 2.5 V
Lead Channel Setting Pacing Pulse Width: 0.4 ms
Lead Channel Setting Sensing Sensitivity: 1.2 mV

## 2019-07-08 NOTE — Progress Notes (Signed)
Virtual Visit via Video Note  I connected with Jeff Weaver on 07/09/19 at  3:45 PM EST by a video enabled telemedicine application and verified that I am speaking with the correct person using two identifiers.   I discussed the limitations of evaluation and management by telemedicine and the availability of in person appointments. The patient expressed understanding and agreed to proceed.  Present for the visit:  Myself, Dr Billey Gosling, [name], his wife [name] and his daughter [name].  The patient is currently at home and I am in the office.    No referring provider.    History of Present Illness: This is an acute visit for falling asleep at unusual times - while eating and sitting in chair.  He has felt more tired and just has not felt himself.  The history is supplemented by his wife and daughter due to his poor memory.  His symptoms started a few days ago.  He started to complain of feeling more tired and sleepy.  If he sits down he will often take a nap, which could be for a couple of seconds-30 minutes.  As long as he is active attending on his mind he did not follow through.  His sleep at night is okay.  He is using his CPAP nightly, but often gets normal 3-4 hours of view only.  He sometimes takes it off without realizing it.  He does not think that his use of his CPAP is changed.  He gets his CPAP through the [place] and he knows there is no in the office of his use.  Improved from shortness of breath with activity recently.  He denies any change in leg swelling, chest pain, coughing or wheezing.  He has not had any fevers or chills.  He is walking regularly for exercise.  He denies other new symptoms.   He has not had any blood work in a while.    Review of Systems  Constitutional: Negative for chills and fever.  Respiratory: Positive for shortness of breath. Negative for cough and wheezing.   Cardiovascular: Positive for leg swelling (no change). Negative for chest pain and  palpitations.  Musculoskeletal: Positive for myalgias.  Neurological: Positive for dizziness (when changing positions). Negative for headaches.     Social History   Socioeconomic History  . Marital status: Married    Spouse name: Not on file  . Number of children: 1  . Years of education: Not on file  . Highest education level: Not on file  Occupational History  . Occupation: retired/ Education officer, community  Tobacco Use  . Smoking status: Former Smoker    Packs/day: 0.00    Years: 30.00    Pack years: 0.00    Types: Pipe    Quit date: 07/04/2008    Years since quitting: 11.0  . Smokeless tobacco: Former Network engineer and Sexual Activity  . Alcohol use: Yes    Alcohol/week: 0.0 standard drinks    Comment: Very Infrequently   . Drug use: No  . Sexual activity: Not Currently  Other Topics Concern  . Not on file  Social History Narrative  . Not on file   Social Determinants of Health   Financial Resource Strain:   . Difficulty of Paying Living Expenses: Not on file  Food Insecurity:   . Worried About Charity fundraiser in the Last Year: Not on file  . Ran Out of Food in the Last Year: Not on file  Transportation Needs:   .  Lack of Transportation (Medical): Not on file  . Lack of Transportation (Non-Medical): Not on file  Physical Activity: Insufficiently Active  . Days of Exercise per Week: 3 days  . Minutes of Exercise per Session: 30 min  Stress: No Stress Concern Present  . Feeling of Stress : Only a little  Social Connections: Unknown  . Frequency of Communication with Friends and Family: Not on file  . Frequency of Social Gatherings with Friends and Family: Not on file  . Attends Religious Services: Not on file  . Active Member of Clubs or Organizations: Not on file  . Attends Archivist Meetings: Not on file  . Marital Status: Married     Observations/Objective: Appears well in NAD Breathing normally Skin appears warm and dry  Assessment and Plan:  See  Problem List for Assessment and Plan of chronic medical problems.   Follow Up Instructions:    I discussed the assessment and treatment plan with the patient. The patient was provided an opportunity to ask questions and all were answered. The patient agreed with the plan and demonstrated an understanding of the instructions.   The patient was advised to call back or seek an in-person evaluation if the symptoms worsen or if the condition fails to improve as anticipated.    [name], MD

## 2019-07-09 ENCOUNTER — Encounter: Payer: Self-pay | Admitting: Internal Medicine

## 2019-07-09 ENCOUNTER — Ambulatory Visit: Payer: Medicare Other | Admitting: Internal Medicine

## 2019-07-09 ENCOUNTER — Ambulatory Visit (INDEPENDENT_AMBULATORY_CARE_PROVIDER_SITE_OTHER): Payer: Medicare Other | Admitting: Internal Medicine

## 2019-07-09 DIAGNOSIS — R7303 Prediabetes: Secondary | ICD-10-CM | POA: Diagnosis not present

## 2019-07-09 DIAGNOSIS — R0602 Shortness of breath: Secondary | ICD-10-CM

## 2019-07-09 DIAGNOSIS — R5383 Other fatigue: Secondary | ICD-10-CM | POA: Diagnosis not present

## 2019-07-09 DIAGNOSIS — E785 Hyperlipidemia, unspecified: Secondary | ICD-10-CM | POA: Diagnosis not present

## 2019-07-09 NOTE — Assessment & Plan Note (Signed)
Having increased fatigue and sleepiness for couple of days or so Napping easily when sitting/not active Sleeping ok - uses cpap, but only using for maybe 3-4 hrs a night Not getting enough hours on cpap which may be cause of fatigue/sleepiness Advised doing cpap with [facility]ology not [facility] which will give more feedback and that will result in better usage We will check blood work to rule out other causes-CMP, CBC, TSH

## 2019-07-09 NOTE — Assessment & Plan Note (Signed)
chronic Taking statin daily Check lipids, cmp since he has not had blood work done in a long time

## 2019-07-09 NOTE — Assessment & Plan Note (Signed)
Chronic Walking daily a1c

## 2019-07-09 NOTE — Assessment & Plan Note (Signed)
Has been worse recently-possibly related to insufficient hours of use of CPAP Discussed changing CPAP to cardiology We will check blood work to rule out other causes Further evaluation depending on blood work and better compliance with CPAP

## 2019-07-11 LAB — CUP PACEART INCLINIC DEVICE CHECK
Date Time Interrogation Session: 20201203150130
Implantable Lead Implant Date: 20191230
Implantable Lead Implant Date: 20191230
Implantable Lead Location: 753859
Implantable Lead Location: 753860
Implantable Lead Model: 5076
Implantable Lead Model: 5076
Implantable Pulse Generator Implant Date: 20191230

## 2019-07-15 ENCOUNTER — Telehealth: Payer: Self-pay

## 2019-07-15 NOTE — Telephone Encounter (Signed)
    Left msg vcml to call to schedule labs

## 2019-07-15 NOTE — Telephone Encounter (Signed)
-----   Message from [name], MD sent at 07/09/2019  4:32 PM EST ----- Needs blood work scheduled - ordered

## 2019-08-13 DIAGNOSIS — G40109 Localization-related (focal) (partial) symptomatic epilepsy and epileptic syndromes with simple partial seizures, not intractable, without status epilepticus: Secondary | ICD-10-CM | POA: Diagnosis not present

## 2019-08-13 DIAGNOSIS — Z79899 Other long term (current) drug therapy: Secondary | ICD-10-CM | POA: Diagnosis not present

## 2019-08-13 DIAGNOSIS — R569 Unspecified convulsions: Secondary | ICD-10-CM | POA: Diagnosis not present

## 2019-08-13 DIAGNOSIS — Z9989 Dependence on other enabling machines and devices: Secondary | ICD-10-CM | POA: Diagnosis not present

## 2019-08-13 DIAGNOSIS — G4733 Obstructive sleep apnea (adult) (pediatric): Secondary | ICD-10-CM | POA: Diagnosis not present

## 2019-08-15 MED FILL — ROSUVASTATIN CALCIUM 5 MG T: 5 | 90 days supply | Qty: 45 | Fill #1

## 2019-08-15 MED FILL — FUROSEMIDE 40 MG TAB: 40 | 90 days supply | Qty: 90 | Fill #1

## 2019-08-21 ENCOUNTER — Other Ambulatory Visit (INDEPENDENT_AMBULATORY_CARE_PROVIDER_SITE_OTHER): Payer: Medicare Other

## 2019-08-21 ENCOUNTER — Ambulatory Visit (INDEPENDENT_AMBULATORY_CARE_PROVIDER_SITE_OTHER): Payer: Medicare Other | Admitting: Internal Medicine

## 2019-08-21 VITALS — BP 130/80 | HR 84 | Temp 98.1°F | Ht 68.0 in | Wt 261.0 lb

## 2019-08-21 DIAGNOSIS — R6889 Other general symptoms and signs: Secondary | ICD-10-CM

## 2019-08-21 DIAGNOSIS — K5010 Crohn's disease of large intestine without complications: Secondary | ICD-10-CM | POA: Diagnosis not present

## 2019-08-21 DIAGNOSIS — B354 Tinea corporis: Secondary | ICD-10-CM | POA: Diagnosis not present

## 2019-08-21 DIAGNOSIS — K5090 Crohn's disease, unspecified, without complications: Secondary | ICD-10-CM | POA: Diagnosis not present

## 2019-08-21 DIAGNOSIS — Z8719 Personal history of other diseases of the digestive system: Secondary | ICD-10-CM

## 2019-08-21 DIAGNOSIS — Z1211 Encounter for screening for malignant neoplasm of colon: Secondary | ICD-10-CM

## 2019-08-21 DIAGNOSIS — R5383 Other fatigue: Secondary | ICD-10-CM | POA: Diagnosis not present

## 2019-08-21 LAB — COMPREHENSIVE METABOLIC PANEL
ALT: 46 U/L (ref 0–53)
AST: 25 U/L (ref 0–37)
Albumin: 4.2 g/dL (ref 3.5–5.2)
Alkaline Phosphatase: 47 U/L (ref 39–117)
BUN: 12 mg/dL (ref 6–23)
CO2: 33 mEq/L — ABNORMAL HIGH (ref 19–32)
Calcium: 10.2 mg/dL (ref 8.4–10.5)
Chloride: 95 mEq/L — ABNORMAL LOW (ref 96–112)
Creatinine, Ser: 0.99 mg/dL (ref 0.40–1.50)
GFR: 73.64 mL/min (ref >60.00)
Glucose, Bld: 105 mg/dL — ABNORMAL HIGH (ref 70–99)
Potassium: 3.9 mEq/L (ref 3.5–5.1)
Sodium: 136 mEq/L (ref 135–145)
Total Bilirubin: 0.7 mg/dL (ref 0.2–1.2)
Total Protein: 7.4 g/dL (ref 6.0–8.3)

## 2019-08-21 LAB — CBC WITH DIFFERENTIAL/PLATELET
Basophils Absolute: 0.1 10*3/uL (ref 0.0–0.1)
Basophils Relative: 0.5 % (ref 0.0–3.0)
Eosinophils Absolute: 0.1 10*3/uL (ref 0.0–0.7)
Eosinophils Relative: 1.2 % (ref 0.0–5.0)
HCT: 45.4 % (ref 39.0–52.0)
Hemoglobin: 15.8 g/dL (ref 13.0–17.0)
Lymphocytes Relative: 19.4 % (ref 12.0–46.0)
Lymphs Abs: 2.1 10*3/uL (ref 0.7–4.0)
MCHC: 34.9 g/dL (ref 30.0–36.0)
MCV: 91.3 fl (ref 78.0–100.0)
Monocytes Absolute: 0.8 10*3/uL (ref 0.1–1.0)
Monocytes Relative: 7.9 % (ref 3.0–12.0)
Neutro Abs: 7.6 10*3/uL (ref 1.4–7.7)
Neutrophils Relative %: 71 % (ref 43.0–77.0)
Platelets: 173 10*3/uL (ref 150.0–400.0)
RBC: 4.97 Mil/uL (ref 4.22–5.81)
RDW: 13.1 % (ref 11.5–15.5)
WBC: 10.7 10*3/uL — ABNORMAL HIGH (ref 4.0–10.5)

## 2019-08-21 LAB — IBC + FERRITIN
Ferritin: 80.5 ng/mL (ref 22.0–322.0)
Iron: 138 ug/dL (ref 42–165)
Saturation Ratios: 35.2 % (ref 20.0–50.0)
Transferrin: 280 mg/dL (ref 212.0–360.0)

## 2019-08-21 LAB — TSH: TSH: 3.24 u[IU]/mL (ref 0.35–4.50)

## 2019-08-21 MED ORDER — NYSTATIN 100000 UNIT/GM EX OINT: 1.0000 "application " | TOPICAL_OINTMENT | Freq: Three times a day (TID) | CUTANEOUS | 1 refills | Status: DC

## 2019-08-21 MED FILL — NYSTATIN 100,000 UNITS/GM O: 100000 | 10 days supply | Qty: 30 | Fill #0

## 2019-08-21 NOTE — Progress Notes (Signed)
Patient ID: [name], male   DOB: [birth year], 74 y.o.   MRN: [id number] HPI: Jeff Weaver is a 74 year old male with a history of Crohn's colitis, diverticulosis, IBS, history of gallstones, GERD with hiatal hernia who is here for follow-up.  It has been 4 years since he has been seen here.  He was previously managed by Dr. Olevia Perches before her retirement.  I did see him in February 2017.  He also has a history of cerebral aneurysm repaired but complicated by hemorrhage and seizure disorder requiring VP shunt and associated with some memory loss, history of hypertension, hyperlipidemia, sleep apnea.  He is here today with his wife.  From a GI perspective he does report that he feels he is doing well but his wife notes that he will complain of abdominal discomfort on occasion.  He had done this for 3 to 4 days in a row very recently.  He reports his bowel movements are regular usually a good bowel movement in the morning and 2 or 3 smaller bowel movements later in the day.  No visible blood in stool or melena.  He does occasionally feel abdominal fullness or discomfort.  No nausea or vomiting.  No dysphagia or odynophagia.  He does recall having significant Crohn's colitis in his past which point he had more significant abdominal pain and diarrhea.  He feels his Crohn's is likely under control.  Appetite has been good.  His activity has been limited.  He has gained weight slowly over time.  He is having issues with rash in the folds of his abdominal skin.  He also notes that he feels cold deep in his lower legs bilaterally.  He was recently prescribed Lexapro for mood but this was not started.  Last EGD 04/25/2014 by Dr. Olevia Perches, nonobstructing Schatzki's ring, 2 cm hiatal hernia and several fundic gland polyps in the proximal stomach which were biopsied. Pathology results revealed hyperplastic polyp without dysplasia or metaplasia.  Last colonoscopy 04/10/2013 -- performed for IBD surveillance. Mild sigmoid  diverticulosis. Melanosis. No evidence of active colitis no pseudopolyps. Biopsies performed. Biopsies benign with no IBD or dysplasia  Past Medical History:  Diagnosis Date  . Aortic insufficiency    a. mild 2017 echo.  . Cerebral aneurysm    a. s/p clipping at [facility] in 8/13 c/b short-term memory loss, cerebral hemorrhage and seizure disorder s/p VP shunt.  . Cerebral hemorrhage ([place])   . Chronic diastolic CHF (congestive heart failure) ([place])   . CVA (cerebral infarction)   . Diverticulosis   . Enteritis (regional)    Dr Olevia Perches  . First degree AV block   . GI bleed   . HTN (hypertension)   . Hyperlipidemia   . Hypertensive heart disease   . IBS (irritable bowel syndrome)   . Mild CAD    Cardiac cath 01/12/18 showed minimal nonobstructive CAD, normal LVEF, normal LVEDP.  Marland Kitchen NSVT (nonsustained ventricular tachycardia) ([place])   . Orthostatic hypotension   . Pre-diabetes   . PVC's (premature ventricular contractions)   . 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 Rheumatoid arthritis(714.0)    dxed in [employer] in 1980s  . Second degree AV block, Mobitz type I   . Seizures ([place])   . Sinus bradycardia   . Sleep apnea   . SSS (sick sinus syndrome) ([place])    a. s/p PPM 07/02/18.    Past Surgical History:  Procedure Laterality Date  . CHOLECYSTECTOMY N/A 01/07/2016   Procedure:  LAPAROSCOPIC CHOLECYSTECTOMY ;  Surgeon: Coralie Keens, MD;  Location: [facility];  Service: General;  Laterality: N/A;  . cns shunt  02/23/12  . COLONOSCOPY W/ POLYPECTOMY  1978   negative 2009, Dr Olevia Perches. Due 2014  . CRANIOTOMY  02/02/12   Dr Harvel Ricks, [place] of aneurysm  . 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 HERNIA REPAIR    . LEFT HEART CATH AND CORONARY ANGIOGRAPHY N/A 01/12/2018   Procedure: LEFT HEART CATH AND CORONARY ANGIOGRAPHY;  Surgeon: Martinique, Peter M, MD;  Location: [facility] LAB;  Service:  Cardiovascular;  Laterality: N/A;  . LEFT HEART CATHETERIZATION WITH CORONARY ANGIOGRAM N/A 11/26/2013   Procedure: LEFT HEART CATHETERIZATION WITH CORONARY ANGIOGRAM;  Surgeon: [name], MD;  Location: [facility] CATH LAB;  Service: Cardiovascular;  Laterality: N/A;  . NOSE SURGERY    . PACEMAKER IMPLANT N/A 07/02/2018   Procedure: PACEMAKER IMPLANT;  Surgeon: Constance Haw, MD;  Location: [facility];  Service: Cardiovascular;  Laterality: N/A;  . SHOULDER SURGERY  1997  . TONSILLECTOMY AND ADENOIDECTOMY    . VENTRICULOPERITONEAL SHUNT  02-23-12   INSERTION OF RIGHT FRONTAL VENTRICULOPERITONEAL SHUNT WITH A CODMAN HAKIM PROGRAMMABLE VALVE    Outpatient Medications Prior to Visit  Medication Sig Dispense Refill  . acetaminophen (TYLENOL) 325 MG tablet Take 325-650 mg by mouth every 6 (six) hours as needed for headache.    Marland Kitchen amoxicillin (AMOXIL) 500 MG capsule Take 2,000 mg by mouth as directed. Take 2000 mg by mouth 1 hour prior to dental appointments    . aspirin EC 81 MG tablet Take 1 tablet (81 mg total) by mouth daily. 90 tablet 3  . blood glucose meter kit and supplies KIT Dispense based on patient and insurance preference. Use up to four times daily as directed. (prediabetes hypoglycemia). 1 each 0  . cetirizine (ZYRTEC) 10 MG tablet Take 1 tablet (10 mg total) by mouth daily. 30 tablet 11  . Cholecalciferol (VITAMIN D) 50 MCG (2000 UT) CAPS Take 1 capsule by mouth daily.    . fluticasone (FLONASE) 50 MCG/ACT nasal spray Place 1-2 sprays into both nostrils daily as needed.     . furosemide (LASIX) 40 MG tablet TAKE 1 TABLET (40 MG TOTAL) BY MOUTH DAILY. 90 tablet 2  . levETIRAcetam (KEPPRA) 500 MG tablet Take 1,000 mg by mouth 2 (two) times daily.     Marland Kitchen LORazepam (ATIVAN) 1 MG tablet Take 1 mg by mouth every 6 (six) hours as needed for seizure.     . mirabegron ER (MYRBETRIQ) 50 MG TB24 tablet Take 50 mg by mouth daily.    . nitroGLYCERIN (NITROSTAT) 0.4 MG SL tablet Place  1 tablet (0.4 mg total) under the tongue every 5 (five) minutes as needed for chest pain. 25 tablet 3  . NON FORMULARY CPAP Machine: authorized by [place] in [place]    . pyridOXINE (VITAMIN B-6) 100 MG tablet Take 100 mg by mouth daily.    . rosuvastatin (CRESTOR) 5 MG tablet TAKE 1 TABLET BY MOUTH 3 TIMES A WEEK AND INCREASE AS TOLERATED 45 tablet 2  . Tamsulosin HCl (FLOMAX) 0.4 MG CAPS Take 0.4 mg by mouth every evening.     . thiamine 100 MG tablet Take 100 mg by mouth daily.    Marland Kitchen VIMPAT 100 MG TABS Take 200 mg by mouth 2 (two) times daily.    . vitamin B-12 (CYANOCOBALAMIN) 1000 MCG  tablet Take 1,000 mcg by mouth daily.    Marland Kitchen escitalopram (LEXAPRO) 10 MG tablet Take by mouth.    . cholecalciferol (VITAMIN D) 1000 units tablet Take 1,000 Units by mouth daily.    . mirabegron ER (MYRBETRIQ) 25 MG TB24 tablet Take 50 mg by mouth every evening.      No facility-administered medications prior to visit.    Allergies  Allergen Reactions  . Haloperidol Rash and Other (See Comments)    Hallucinations, agitation   . Rosuvastatin     Leg pain mainly in knees  . Simvastatin     2007: nausea & vomiting ; [facility] ( he does not remember such)  . Lisinopril Cough  . Nifedipine Swelling    Thought to contribute to lower extremity edema   . Pravastatin     Rxed by [facility] 2009; "myositis" Ok to take per pt's wife/Dr. Linna Darner    Family History  Problem Relation Age of Onset  . COPD Father   . Coronary artery disease Father        MI in 59s  . Hepatitis Mother   . Diabetes Sister   . Dementia Sister 79  . Diabetes Brother   . Colon cancer Neg Hx     Social History   Tobacco Use  . Smoking status: Former Smoker    Packs/day: 0.00    Years: 30.00    Pack years: 0.00    Types: Pipe    Quit date: 07/04/2008    Years since quitting: 11.1  . Smokeless tobacco: Former Network engineer Use Topics  . Alcohol use: Yes    Alcohol/week: 0.0 standard drinks    Comment: Very Infrequently   . Drug use: No     ROS: As per history of present illness, otherwise negative  BP 130/80   Pulse 84   Temp 98.1 F (36.7 C)   Ht _0  (1.727 m)   Wt 261 lb (118.4 kg)   BMI 39.68 kg/m  Gen: awake, alert, NAD HEENT: anicteric CV: RRR, no mrg Pulm: CTA b/l Abd: soft, obese, NT/ND, +BS throughout Ext: no c/c/e Neuro: nonfocal   RELEVANT LABS AND IMAGING: CBC    Component Value Date/Time   WBC 9.3 07/02/2018 0807   RBC 4.70 07/02/2018 0807   HGB 14.7 07/02/2018 0807   HGB 15.1 01/03/2018 1143   HCT 44.0 07/02/2018 0807   HCT 43.3 01/03/2018 1143   PLT 163 07/02/2018 0807   PLT 179 01/03/2018 1143   MCV 93.6 07/02/2018 0807   MCV 90 01/03/2018 1143   MCH 31.3 07/02/2018 0807   MCHC 33.4 07/02/2018 0807   RDW 12.8 07/02/2018 0807   RDW 13.2 01/03/2018 1143   LYMPHSABS 1.5 04/18/2018 1023   LYMPHSABS 2.1 01/03/2018 1143   MONOABS 0.8 04/18/2018 1023   EOSABS 0.3 04/18/2018 1023   EOSABS 0.2 01/03/2018 1143   BASOSABS 0.1 04/18/2018 1023   BASOSABS 0.0 01/03/2018 1143    CMP     Component Value Date/Time   NA 139 03/18/2019 1011   K 4.0 03/18/2019 1011   CL 96 03/18/2019 1011   CO2 27 03/18/2019 1011   GLUCOSE 127 (H) 03/18/2019 1011   GLUCOSE 118 (H) 07/02/2018 0807   BUN 11 03/18/2019 1011   CREATININE 1.02 03/18/2019 1011   CALCIUM 9.5 03/18/2019 1011   PROT 6.8 03/18/2019 1011   ALBUMIN 4.2 03/18/2019 1011   AST 25 03/18/2019 1011   ALT 46 (H) 03/18/2019 1011  ALKPHOS 47 03/18/2019 1011   BILITOT 0.4 03/18/2019 1011   GFRNONAA 72 03/18/2019 1011   GFRAA 83 03/18/2019 1011    ASSESSMENT/PLAN: 74 year old male with a history of Crohn's colitis, diverticulosis, IBS, history of gallstones, GERD with hiatal hernia who is here for follow-up.   1.  Crohn's colitis/colon cancer screening --we discussed his Crohn's disease today and it does seem that he has clinical remission though we have done no objective testing for this in nearly 7 years.  He is at higher risk for  colon polyps and colon cancer given his history of longstanding Crohn's colitis.  At this point his disease is likely "burned out" as he has not had a recent flare and is not been on maintenance meds.  We discussed colonoscopy which I think is appropriate for him.  He is interested in a less invasive approach and is interested in Cologuard.  I had prescribed this before but he did not return it.  I also recommended that for not proceeding immediately to procedure then we should check fecal calprotectin as a surrogate to look for inflammation in the bowel --Cologuard --Fecal calprotectin --CBC, CMP, iron studies  2.  Tinea corporis --related to skin folds, nystatin cream 3 times daily until improved and then as needed  3.  Eczema --moisturizer such as CeraVe recommended for daily use  4.  Cold sensation bilateral legs --I am checking TSH today along with blood counts and iron studies.  I encouraged him to discuss this further with Dr. Quay Burow, his PCP  45 minutes total spent today including patient facing time, coordination of care, reviewing medical history/procedures/pertinent radiology studies, and documentation of the encounter.   OJ:ZBFMZ, Claudina Lick, [place] [place],  [place] [id number]

## 2019-08-21 NOTE — Patient Instructions (Signed)
Your provider has requested that you go to the basement level for lab work before leaving today. Press "B" on the elevator. The lab is located at the first door on the left as you exit the elevator. ___________________________________________________ We have sent the following medications to your pharmacy for you to pick up at your convenience: Nystatin ointment three times daily to the affected area ___________________________________________________ Please purchase the following medications over the counter and take as directed: Cerave moisturizing cream-apply to areas of eczema ____________________________________________________ Your provider has ordered Cologuard testing as an option for colon cancer screening. This is performed by [facility] and may be out of network with your insurance. PRIOR to completing the test, it is YOUR responsibility to contact your insurance about covered benefits for this test. Your out of pocket expense could be anywhere from $0.00 to $649.00.   When you call to check coverage with your insurer, please provide the following information:   -The ONLY provider of Cologuard is [place] code for Cologuard is [phone].  [employer] NPI # [id number]  -[employer] Tax ID # [id number]   We have already sent your demographic and insurance information to [facility] (phone number [phone]) and they should contact you within the next week regarding your test. If you have not heard from them within the next week, please call our office at [phone]. __________________________________________________  If you are age 76 or older, your body mass index should be between 23-30. Your Body mass index is 39.68 kg/m. If this is out of the aforementioned range listed, please consider follow up with your Primary Care Provider.  If you are age 5 or younger, your body mass index should be between 19-25. Your Body  mass index is 39.68 kg/m. If this is out of the aformentioned range listed, please consider follow up with your Primary Care Provider.  _______________________________________________________ Due to recent changes in healthcare laws, you may see the results of your imaging and laboratory studies on MyChart before your provider has had a chance to review them.  We understand that in some cases there may be results that are confusing or concerning to you. Not all laboratory results come back in the same time frame and the provider may be waiting for multiple results in order to interpret others.  Please give [place] 48 hours in order for your provider to thoroughly review all the results before contacting the office for clarification of your results.

## 2019-08-26 ENCOUNTER — Other Ambulatory Visit: Payer: Medicare Other

## 2019-08-27 MED FILL — NITROGLYCERIN 0.4 MG TAB SL: 0.4 | 7 days supply | Qty: 25 | Fill #1

## 2019-09-05 DIAGNOSIS — Z961 Presence of intraocular lens: Secondary | ICD-10-CM | POA: Diagnosis not present

## 2019-10-02 ENCOUNTER — Ambulatory Visit (INDEPENDENT_AMBULATORY_CARE_PROVIDER_SITE_OTHER): Payer: Medicare Other | Admitting: *Deleted

## 2019-10-02 DIAGNOSIS — I495 Sick sinus syndrome: Secondary | ICD-10-CM | POA: Diagnosis not present

## 2019-10-02 LAB — CUP PACEART REMOTE DEVICE CHECK
Battery Remaining Longevity: 120 mo
Battery Voltage: 3.01 V
Brady Statistic AP VP Percent: 80.5 %
Brady Statistic AP VS Percent: 1.04 %
Brady Statistic AS VP Percent: 11.58 %
Brady Statistic AS VS Percent: 6.88 %
Brady Statistic RA Percent Paced: 86.11 %
Brady Statistic RV Percent Paced: 92.08 %
Date Time Interrogation Session: 20210330210200
Implantable Lead Implant Date: 20191230
Implantable Lead Implant Date: 20191230
Implantable Lead Location: 753859
Implantable Lead Location: 753860
Implantable Lead Model: 5076
Implantable Lead Model: 5076
Implantable Pulse Generator Implant Date: 20191230
Lead Channel Impedance Value: 304 Ohm
Lead Channel Impedance Value: 323 Ohm
Lead Channel Impedance Value: 399 Ohm
Lead Channel Impedance Value: 608 Ohm
Lead Channel Pacing Threshold Amplitude: 0.75 V
Lead Channel Pacing Threshold Amplitude: 1.125 V
Lead Channel Pacing Threshold Pulse Width: 0.4 ms
Lead Channel Pacing Threshold Pulse Width: 0.4 ms
Lead Channel Sensing Intrinsic Amplitude: 18.5 mV
Lead Channel Sensing Intrinsic Amplitude: 18.5 mV
Lead Channel Sensing Intrinsic Amplitude: 2.5 mV
Lead Channel Sensing Intrinsic Amplitude: 2.5 mV
Lead Channel Setting Pacing Amplitude: 1.5 V
Lead Channel Setting Pacing Amplitude: 2.5 V
Lead Channel Setting Pacing Pulse Width: 0.4 ms
Lead Channel Setting Sensing Sensitivity: 1.2 mV

## 2019-10-02 NOTE — Progress Notes (Signed)
PPM Remote  

## 2019-10-09 ENCOUNTER — Ambulatory Visit: Payer: Medicare Other | Admitting: Cardiology

## 2019-10-10 ENCOUNTER — Telehealth: Payer: Self-pay | Admitting: Cardiology

## 2019-10-10 NOTE — Telephone Encounter (Signed)
I spoke to the patient's wife and said that a family member may accompany the patient to his appointment next week with Dr [name].

## 2019-10-10 NOTE — Telephone Encounter (Signed)
New Message  Patient's wife is calling in to get approval to accompany patient to his appointmentStates that patient has short term memory issues as a result of a complication of brain surgery. The patient will need someone to be with him during the appointment. The paitent will either bring his wife or his daughter. Patient's wife states that this will need to happen each time due to the memory issues.

## 2019-10-15 ENCOUNTER — Other Ambulatory Visit: Payer: Self-pay | Admitting: Cardiology

## 2019-10-15 ENCOUNTER — Other Ambulatory Visit: Payer: Self-pay

## 2019-10-15 ENCOUNTER — Ambulatory Visit (INDEPENDENT_AMBULATORY_CARE_PROVIDER_SITE_OTHER): Payer: Medicare Other | Admitting: Cardiology

## 2019-10-15 ENCOUNTER — Encounter: Payer: Self-pay | Admitting: Cardiology

## 2019-10-15 VITALS — BP 126/82 | HR 65 | Ht 68.0 in | Wt 261.6 lb

## 2019-10-15 DIAGNOSIS — I10 Essential (primary) hypertension: Secondary | ICD-10-CM

## 2019-10-15 DIAGNOSIS — K219 Gastro-esophageal reflux disease without esophagitis: Secondary | ICD-10-CM | POA: Diagnosis not present

## 2019-10-15 DIAGNOSIS — I25118 Atherosclerotic heart disease of native coronary artery with other forms of angina pectoris: Secondary | ICD-10-CM | POA: Diagnosis not present

## 2019-10-15 MED ORDER — OMEPRAZOLE 40 MG PO CPDR: 40.0000 mg | DELAYED_RELEASE_CAPSULE | Freq: Every day | ORAL | 2 refills | Status: DC

## 2019-10-15 MED FILL — OMEPRAZOLE 40 MG CPDR: 40 | 90 days supply | Qty: 90 | Fill #0

## 2019-10-15 NOTE — Progress Notes (Signed)
Cardiology Office Note:    Date:  10/15/2019   ID:  Jeff Weaver, DOB 07-22-1945, MRN [id number]  PCP:  Jeff Rail, Jeff Weaver  Cardiologist:  Jeff Dawley, Jeff Weaver  Electrophysiologist:  Jeff Haw, Jeff Weaver   Referring Jeff Weaver: Jeff Rail, Jeff Weaver   Reason for visit: 6 months follow-up  History of Present Illness:    Jeff Weaver is a 74 y.o. male with a hx of  hx of chronic diastolic, obesity and PPM.  He has a hx of OSA and has been on CPAP for some time.  He gets his supplies from the [place] but does not have a formal sleep Jeff Weaver.  He is doing well with his CPAP device and just got a new one from the [place].  He tolerates the full face mask and feels the pressure is adequate.  Since going on CPAP he feels rested in the am if he has gotten enough sleep the night before. He tells me that he has problems taking his mask off in his sleep at night sometimes.  He has had a hx of cerebral hemorrhage and seizure d/o s/p VP shunt and is on seizure meds that make him sleepy during the day. His wife is concerned because she thinks he is now sleeping more and is worried that his CPAP may need to be adjusted.  He has some mouth dryness at times.  He does not think that he snores.   He is now status post Medtronic dual-chamber pacemaker implanted 07/03/2018, he is being followed by our EP clinic and his pacemaker is functioning well.  The patient states that he has been doing well, he can walk up to 1 and half miles a day without any symptoms of chest pain or shortness of breath.  He has been experiencing left-sided chest pain not related to exertion actually they get worse at rest.  No dizziness, no falls, he is tolerating his medications well.  Past Medical History:  Diagnosis Date  . Aortic insufficiency    a. mild 2017 echo.  . Cerebral aneurysm    a. s/p clipping at [facility] in 8/13 c/b short-term memory loss, cerebral hemorrhage and seizure disorder s/p VP shunt.  . Cerebral hemorrhage ([place])   . Chronic diastolic  CHF (congestive heart failure) ([place])   . CVA (cerebral infarction)   . Diverticulosis   . Enteritis (regional)    Dr Olevia Perches  . First degree AV block   . GI bleed   . HTN (hypertension)   . Hyperlipidemia   . Hypertensive heart disease   . IBS (irritable bowel syndrome)   . Mild CAD    Cardiac cath 01/12/18 showed minimal nonobstructive CAD, normal LVEF, normal LVEDP.  Marland Kitchen NSVT (nonsustained ventricular tachycardia) ([place])   . Orthostatic hypotension   . Pre-diabetes   . PVC's (premature ventricular contractions)   . 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 Rheumatoid arthritis(714.0)    dxed in [employer] in 1980s  . Second degree AV block, Mobitz type I   . Seizures ([place])   . Sinus bradycardia   . Sleep apnea   . SSS (sick sinus syndrome) ([place])    a. s/p PPM 07/02/18.   Past Surgical History:  Procedure Laterality Date  . CHOLECYSTECTOMY N/A 01/07/2016   Procedure: LAPAROSCOPIC CHOLECYSTECTOMY ;  Surgeon: Coralie Keens, Jeff Weaver;  Location: [place];  Service: General;  Laterality: N/A;  . cns shunt  02/23/12  . COLONOSCOPY W/ POLYPECTOMY  1978  negative 2009, Dr Olevia Perches. Due 2014  . CRANIOTOMY  02/02/12   Dr Harvel Ricks, [place] of aneurysm  . 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 HERNIA REPAIR    . LEFT HEART CATH AND CORONARY ANGIOGRAPHY N/A 01/12/2018   Procedure: LEFT HEART CATH AND CORONARY ANGIOGRAPHY;  Surgeon: Martinique, Peter M, Jeff Weaver;  Location: [place] [facility];  Service: Cardiovascular;  Laterality: N/A;  . LEFT HEART CATHETERIZATION WITH CORONARY ANGIOGRAM N/A 11/26/2013   Procedure: LEFT HEART CATHETERIZATION WITH CORONARY ANGIOGRAM;  Surgeon: [name], Jeff Weaver;  [facility] CATH LAB;  Service: Cardiovascular;  Laterality: N/A;  . NOSE SURGERY    . PACEMAKER IMPLANT N/A 07/02/2018   Procedure: PACEMAKER IMPLANT;  Surgeon: Jeff Haw, Jeff Weaver;  Location: [place] [facility];  Service:  Cardiovascular;  Laterality: N/A;  . SHOULDER SURGERY  1997  . TONSILLECTOMY AND ADENOIDECTOMY    . VENTRICULOPERITONEAL SHUNT  02-23-12   INSERTION OF RIGHT FRONTAL VENTRICULOPERITONEAL SHUNT WITH A CODMAN HAKIM PROGRAMMABLE VALVE    Current Medications: Current Meds  Medication Sig  . acetaminophen (TYLENOL) 325 MG tablet Take 325-650 mg by mouth every 6 (six) hours as needed for headache.  Marland Kitchen amoxicillin (AMOXIL) 500 MG capsule Take 2,000 mg by mouth as directed. Take 2000 mg by mouth 1 hour prior to dental appointments  . aspirin EC 81 MG tablet Take 1 tablet (81 mg total) by mouth daily.  . blood glucose meter kit and supplies KIT Dispense based on patient and insurance preference. Use up to four times daily as directed. (prediabetes hypoglycemia).  . cetirizine (ZYRTEC) 10 MG tablet Take 1 tablet (10 mg total) by mouth daily.  . Cholecalciferol (VITAMIN D) 50 MCG (2000 UT) CAPS Take 1 capsule by mouth daily.  . fluticasone (FLONASE) 50 MCG/ACT nasal spray Place 1-2 sprays into both nostrils daily as needed.   . furosemide (LASIX) 40 MG tablet TAKE 1 TABLET (40 MG TOTAL) BY MOUTH DAILY.  Marland Kitchen levETIRAcetam (KEPPRA) 500 MG tablet Take 1,000 mg by mouth 2 (two) times daily.   Marland Kitchen LORazepam (ATIVAN) 1 MG tablet Take 1 mg by mouth every 6 (six) hours as needed for seizure.   . mirabegron ER (MYRBETRIQ) 50 MG TB24 tablet Take 50 mg by mouth daily.  . nitroGLYCERIN (NITROSTAT) 0.4 MG SL tablet Place 1 tablet (0.4 mg total) under the tongue every 5 (five) minutes as needed for chest pain.  . NON FORMULARY CPAP Machine: authorized by [facility] in [place]  . nystatin ointment (MYCOSTATIN) Apply 1 application topically 3 (three) times daily.  Marland Kitchen pyridOXINE (VITAMIN B-6) 100 MG tablet Take 100 mg by mouth daily.  . rosuvastatin (CRESTOR) 5 MG tablet TAKE 1 TABLET BY MOUTH 3 TIMES A WEEK AND INCREASE AS TOLERATED  . Tamsulosin HCl (FLOMAX) 0.4 MG CAPS Take 0.4 mg by mouth every evening.   . thiamine 100 MG  tablet Take 100 mg by mouth daily.  Marland Kitchen VIMPAT 200 MG TABS tablet Take 200 mg by mouth 2 (two) times daily.  . vitamin B-12 (CYANOCOBALAMIN) 1000 MCG tablet Take 1,000 mcg by mouth daily.     Allergies:   Haloperidol, Rosuvastatin, Simvastatin, Lisinopril, Nifedipine, and Pravastatin   Social History   Socioeconomic History  . Marital status: Married    Spouse name: Not on file  . Number of children: 1  . Years of education: Not on file  . Highest education level: Not on file  Occupational History  . Occupation: retired/ Education officer, community  Tobacco Use  . Smoking status: Former Smoker    Packs/day: 0.00    Years: 30.00    Pack years: 0.00    Types: Pipe    Quit date: 07/04/2008    Years since quitting: 11.2  . Smokeless tobacco: Former Network engineer and Sexual Activity  . Alcohol use: Yes    Alcohol/week: 0.0 standard drinks    Comment: Very Infrequently   . Drug use: No  . Sexual activity: Not Currently  Other Topics Concern  . Not on file  Social History Narrative  . Not on file   Social Determinants of Health   Financial Resource Strain:   . Difficulty of Paying Living Expenses:   Food Insecurity:   . Worried About Charity fundraiser in the Last Year:   . Arboriculturist in the Last Year:   Transportation Needs:   . Film/video editor (Medical):   Marland Kitchen Lack of Transportation (Non-Medical):   Physical Activity: Insufficiently Active  . Days of Exercise per Week: 3 days  . Minutes of Exercise per Session: 30 min  Stress: No Stress Concern Present  . Feeling of Stress : Only a little  Social Connections: Unknown  . Frequency of Communication with Friends and Family: Not on file  . Frequency of Social Gatherings with Friends and Family: Not on file  . Attends Religious Services: Not on file  . Active Member of Clubs or Organizations: Not on file  . Attends Archivist Meetings: Not on file  . Marital Status: Married     Family History: The patient's family  history includes COPD in his father; Coronary artery disease in his father; Dementia (age of onset: 30) in his sister; Diabetes in his brother and sister; Hepatitis in his mother. There is no history of Colon cancer.  ROS:   Please see the history of present illness.    All other systems reviewed and are negative.  EKGs/Labs/Other Studies Reviewed:    The following studies were reviewed today:  EKG:  EKG is ordered today.  The ekg ordered today demonstrates sequential AV pacing, no change from prior.  Personally reviewed.  Recent Labs: 03/18/2019: NT-Pro BNP 292 08/21/2019: ALT 46; BUN 12; Creatinine, Ser 0.99; Hemoglobin 15.8; Platelets 173.0; Potassium 3.9; Sodium 136; TSH 3.24  Recent Lipid Panel    Component Value Date/Time   CHOL 150 03/18/2019 1011   CHOL 159 03/27/2013 0818   TRIG 146 03/18/2019 1011   TRIG 127 03/27/2013 0818   HDL 46 03/18/2019 1011   HDL 45 03/27/2013 0818   CHOLHDL 3.3 03/18/2019 1011   CHOLHDL 3 04/18/2018 1023   VLDL 26.2 04/18/2018 1023   LDLCALC 79 03/18/2019 1011   LDLCALC 89 03/27/2013 0818   LDLDIRECT 87.6 09/19/2012 1206    Physical Exam:    VS:  BP 126/82   Pulse 65   Ht 5' 8"  (1.727 m)   Wt 261 lb 9.6 oz (118.7 kg)   SpO2 95%   BMI 39.78 kg/m     Wt Readings from Last 3 Encounters:  10/15/19 261 lb 9.6 oz (118.7 kg)  08/21/19 261 lb (118.4 kg)  06/06/19 263 lb (119.3 kg)     GEN:  Well nourished, well developed in no acute distress HEENT: Normal NECK: No JVD; No carotid bruits LYMPHATICS: No lymphadenopathy CARDIAC: RRR, no murmurs, rubs, gallops RESPIRATORY:  Clear to auscultation without rales, wheezing or rhonchi  ABDOMEN: Soft, non-tender, non-distended MUSCULOSKELETAL:  No edema; No deformity  SKIN: Warm and dry NEUROLOGIC:  Alert and oriented x 3 PSYCHIATRIC:  Normal affect   ASSESSMENT:    1. Coronary artery disease involving native coronary artery of native heart with other form of angina pectoris ([place])   2.  Essential hypertension   3. Gastroesophageal reflux disease, unspecified whether esophagitis present    PLAN:    In order of problems listed above:   Junctional bradycardia: Status post Medtronic dual-chamber pacemaker implanted 07/03/2018.  No changes.  Chronic diastolic heart failure: He appears euvolemic.  Hypertension: Currently well controlled  OSA -on CPAP that he is tolerating well.  Chest pain -atypical, appears like reflux we will try omeprazole 40 mg daily.  He had minimal nonobstructive CAD on cardiac cath 2 years ago.  No need for ischemic work-up especially that his pain gets better with exertion.  Medication Adjustments/Labs and Tests Ordered: Current medicines are reviewed at length with the patient today.  Concerns regarding medicines are outlined above.  Orders Placed This Encounter  Procedures  . EKG 12-Lead   Meds ordered this encounter  Medications  . omeprazole (PRILOSEC) 40 MG capsule    Sig: Take 1 capsule (40 mg total) by mouth daily.    Dispense:  90 capsule    Refill:  2    Patient Instructions  Medication Instructions:   START OVER-THE-COUNTER OMEPRAZOLE 40 MG BY MOUTH DAILY  *If you need a refill on your cardiac medications before your next appointment, please call your pharmacy*   Follow-Up: At [facility], you and your health needs are our priority.  As part of our continuing mission to provide you with exceptional heart care, we have created designated Provider Care Teams.  These Care Teams include your primary Cardiologist (physician) and Advanced Practice Providers (APPs -  Physician Assistants and Nurse Practitioners) who all work together to provide you with the care you need, when you need it.  We recommend signing up for the patient portal called "MyChart".  Sign up information is provided on this After Visit Summary.  MyChart is used to connect with patients for Virtual Visits (Telemedicine).  Patients are able to view lab/test  results, encounter notes, upcoming appointments, etc.  Non-urgent messages can be sent to your provider as well.   To learn more about what you can do with MyChart, go to [email or web address].    Your next appointment:   6 month(s)  The format for your next appointment:   In Person  Provider:   Ena Dawley, Jeff Weaver        Signed, Jeff Dawley, Jeff Weaver  10/15/2019 10:25 AM    [place]

## 2019-10-15 NOTE — Patient Instructions (Signed)
Medication Instructions:   START OVER-THE-COUNTER OMEPRAZOLE 40 MG BY MOUTH DAILY  *If you need a refill on your cardiac medications before your next appointment, please call your pharmacy*   Follow-Up: At [facility], you and your health needs are our priority.  As part of our continuing mission to provide you with exceptional heart care, we have created designated Provider Care Teams.  These Care Teams include your primary Cardiologist (physician) and Advanced Practice Providers (APPs -  Physician Assistants and Nurse Practitioners) who all work together to provide you with the care you need, when you need it.  We recommend signing up for the patient portal called "MyChart".  Sign up information is provided on this After Visit Summary.  MyChart is used to connect with patients for Virtual Visits (Telemedicine).  Patients are able to view lab/test results, encounter notes, upcoming appointments, etc.  Non-urgent messages can be sent to your provider as well.   To learn more about what you can do with MyChart, go to [email or web address].    Your next appointment:   6 month(s)  The format for your next appointment:   In Person  Provider:   Ena Dawley, MD

## 2019-10-28 DIAGNOSIS — I10 Essential (primary) hypertension: Secondary | ICD-10-CM | POA: Diagnosis not present

## 2019-10-28 DIAGNOSIS — Z982 Presence of cerebrospinal fluid drainage device: Secondary | ICD-10-CM | POA: Diagnosis not present

## 2019-10-28 DIAGNOSIS — R4020 Unspecified coma: Secondary | ICD-10-CM | POA: Diagnosis not present

## 2019-10-28 DIAGNOSIS — I517 Cardiomegaly: Secondary | ICD-10-CM | POA: Diagnosis not present

## 2019-10-28 DIAGNOSIS — R569 Unspecified convulsions: Secondary | ICD-10-CM | POA: Diagnosis not present

## 2019-10-28 DIAGNOSIS — Z79899 Other long term (current) drug therapy: Secondary | ICD-10-CM | POA: Diagnosis not present

## 2019-10-28 DIAGNOSIS — J90 Pleural effusion, not elsewhere classified: Secondary | ICD-10-CM | POA: Diagnosis not present

## 2019-10-28 DIAGNOSIS — G9389 Other specified disorders of brain: Secondary | ICD-10-CM | POA: Diagnosis not present

## 2019-10-28 DIAGNOSIS — R404 Transient alteration of awareness: Secondary | ICD-10-CM | POA: Diagnosis not present

## 2019-10-28 DIAGNOSIS — R0902 Hypoxemia: Secondary | ICD-10-CM | POA: Diagnosis not present

## 2019-10-28 DIAGNOSIS — Z20822 Contact with and (suspected) exposure to covid-19: Secondary | ICD-10-CM | POA: Diagnosis not present

## 2019-10-28 DIAGNOSIS — G40909 Epilepsy, unspecified, not intractable, without status epilepticus: Secondary | ICD-10-CM | POA: Diagnosis not present

## 2019-10-28 DIAGNOSIS — J810 Acute pulmonary edema: Secondary | ICD-10-CM | POA: Diagnosis not present

## 2019-10-29 DIAGNOSIS — R569 Unspecified convulsions: Secondary | ICD-10-CM | POA: Diagnosis not present

## 2019-10-29 DIAGNOSIS — Z982 Presence of cerebrospinal fluid drainage device: Secondary | ICD-10-CM | POA: Diagnosis not present

## 2019-11-05 DIAGNOSIS — G40209 Localization-related (focal) (partial) symptomatic epilepsy and epileptic syndromes with complex partial seizures, not intractable, without status epilepticus: Secondary | ICD-10-CM | POA: Diagnosis not present

## 2019-11-05 DIAGNOSIS — G40109 Localization-related (focal) (partial) symptomatic epilepsy and epileptic syndromes with simple partial seizures, not intractable, without status epilepticus: Secondary | ICD-10-CM | POA: Diagnosis not present

## 2019-11-05 DIAGNOSIS — Z8679 Personal history of other diseases of the circulatory system: Secondary | ICD-10-CM | POA: Diagnosis not present

## 2019-11-05 DIAGNOSIS — R413 Other amnesia: Secondary | ICD-10-CM | POA: Diagnosis not present

## 2019-11-05 DIAGNOSIS — Z982 Presence of cerebrospinal fluid drainage device: Secondary | ICD-10-CM | POA: Diagnosis not present

## 2019-11-05 DIAGNOSIS — I608 Other nontraumatic subarachnoid hemorrhage: Secondary | ICD-10-CM | POA: Diagnosis not present

## 2019-11-05 DIAGNOSIS — R5383 Other fatigue: Secondary | ICD-10-CM | POA: Diagnosis not present

## 2019-11-05 DIAGNOSIS — G4733 Obstructive sleep apnea (adult) (pediatric): Secondary | ICD-10-CM | POA: Diagnosis not present

## 2019-11-05 DIAGNOSIS — Z87891 Personal history of nicotine dependence: Secondary | ICD-10-CM | POA: Diagnosis not present

## 2019-11-05 DIAGNOSIS — Z888 Allergy status to other drugs, medicaments and biological substances status: Secondary | ICD-10-CM | POA: Diagnosis not present

## 2019-11-05 DIAGNOSIS — Z79899 Other long term (current) drug therapy: Secondary | ICD-10-CM | POA: Diagnosis not present

## 2019-11-07 NOTE — Progress Notes (Signed)
Cardiology Office Note    Date:  11/08/2019   ID:  Jeff Weaver, DOB [birth year], MRN [id number]  PCP:  [name], MD  Cardiologist:  Ena Dawley, MD  Electrophysiologist:  Constance Haw, MD   Chief Complaint: post ER visit for seizures - had questions about cardiac status  History of Present Illness:   Jeff Weaver is a 74 y.o. 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   He has a history of chronic atypical chest pain. He was first seen by Dr. [name] 04/2013 for this and underwent nuclear stress test which was normal. He was admitted 11/2013 for chest pain and elevated troponin (0.74) -> LHC 11/26/13 revealed angiographically normal coronary arteries, mild mid LAD myocardial bridging, moderate to severely elevated LVEDP with systemic hypertension and preserved LVEF. Last echo 2D echo in 01/2016 showed moderate LVH, EF 60-65%, grade 2 DD, no RWMA, mild AI. He was seen in the office 01/2018 with worsening dyspnea on exertion. PFTs showed minimal Obstructive Airways Disease, no significant COPD. Cardiac cath 01/12/18 showed minimal nonobstructive CAD, normal LVEF, normal LVEDP. He had 2nd degree AVB type 1 during cath. He wore a monitor 02/2018 which showed profound bradycardia, AVB with frequently nonconducted P waves, frequent PVCs (7%) and short runs of NSVT. He underwent Medtronic Azure XT DR MRI SureScan dual-chamber pacemaker and has done well from a cardiac standpoint. At last OV with Dr. [name] in 10/2019, he was noting some chest pain so omeprazole was started. He was recently seen at outside ED 10/28/19 with breakthrough  seizures and increasing fatigue/daytime somnolence. Neurologic exam was pertinent for significant expressive greater than receptive aphasia but this was not unusual for him during the post-ictal state. Per note, CBC, CMP, troponin, BNP, EKG, UA all unremarkable and CT head was stable. Labs personally reviewed showed Hgb 14.1, Cr 0.92, K 4.5, Mg 2.0. Portable CXR showed cardiomegaly with mild interstitial edema and small bilateral effusions, cannot exclude superimposed pneumonia. Due to the finding of enlarged heart he was asked to f/u with cardiology to discuss echo/stress test.  He presents today for follow-up discussion of this recommendation, accompanied by his daughter. He denies any new cardiac symptoms. He continues to have chronic atypical chest discomfort in a specific linear distribution about once a week to the left of his chest wall, that happens only at rest, and not with exertion. He has chronic DOE. He has trace sockline edema but no significant changes in fluid accumulation. No orthopnea or weight gain. His beloved brother recently passed away so that has been weighing on his mind.   Past Medical History:  Diagnosis Date  . Aortic insufficiency    a. mild 2017 echo.  . Cerebral aneurysm    a. s/p clipping at [facility] in 8/13 c/b short-term memory loss, cerebral hemorrhage and seizure disorder s/p VP shunt.  . Cerebral hemorrhage ([place])   . Chronic diastolic CHF (congestive heart failure) ([place])   . CVA (cerebral infarction)   . Diverticulosis   . Enteritis (regional)    Dr Olevia Perches  . First degree AV block   . GI bleed   . HTN (hypertension)   .  Hyperlipidemia   . Hypertensive heart disease   . IBS (irritable bowel syndrome)   . Mild CAD    Cardiac cath 01/12/18 showed minimal nonobstructive CAD, normal LVEF, normal LVEDP.  Marland Kitchen NSVT (nonsustained ventricular tachycardia) ([place])   . Orthostatic hypotension   . Pre-diabetes   . PVC's (premature ventricular contractions)   . Regional  enteritis of large intestine ([place]) since 1978  . Rheumatoid arthritis(714.0)    dxed in [employer] in 1980s  . Second degree AV block, Mobitz type I   . Seizures ([place])   . Sinus bradycardia   . Sleep apnea   . SSS (sick sinus syndrome) ([place])    a. s/p PPM 07/02/18.    Past Surgical History:  Procedure Laterality Date  . CHOLECYSTECTOMY N/A 01/07/2016   Procedure: LAPAROSCOPIC CHOLECYSTECTOMY ;  Surgeon: Coralie Keens, MD;  Location: [facility];  Service: General;  Laterality: N/A;  . cns shunt  02/23/12  . COLONOSCOPY W/ POLYPECTOMY  1978   negative 2009, Dr Olevia Perches. Due 2014  . CRANIOTOMY  02/02/12   Dr Harvel Ricks, [place] of aneurysm  . 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 HERNIA REPAIR    . LEFT HEART CATH AND CORONARY ANGIOGRAPHY N/A 01/12/2018   Procedure: LEFT HEART CATH AND CORONARY ANGIOGRAPHY;  Surgeon: Martinique, Peter M, MD;  Location: [facility];  Service: Cardiovascular;  Laterality: N/A;  . LEFT HEART CATHETERIZATION WITH CORONARY ANGIOGRAM N/A 11/26/2013   Procedure: LEFT HEART CATHETERIZATION WITH CORONARY ANGIOGRAM;  Surgeon: [name], MD;  Location: [facility] CATH LAB;  Service: Cardiovascular;  Laterality: N/A;  . NOSE SURGERY    . PACEMAKER IMPLANT N/A 07/02/2018   Procedure: PACEMAKER IMPLANT;  Surgeon: Constance Haw, MD;  Location: [facility] CV LAB;  Service: Cardiovascular;  Laterality: N/A;  . SHOULDER SURGERY  1997  . TONSILLECTOMY AND ADENOIDECTOMY    . VENTRICULOPERITONEAL SHUNT  02-23-12   INSERTION OF RIGHT FRONTAL VENTRICULOPERITONEAL SHUNT WITH A CODMAN HAKIM PROGRAMMABLE VALVE    Current Medications: Current Meds  Medication Sig  . acetaminophen (TYLENOL) 325 MG tablet Take 325-650 mg by mouth every 6 (six) hours as needed for headache.  Marland Kitchen amoxicillin (AMOXIL) 500 MG capsule Take 2,000 mg by mouth as directed. Take 2000 mg by mouth 1 hour prior to dental appointments  .  aspirin EC 81 MG tablet Take 1 tablet (81 mg total) by mouth daily.  . blood glucose meter kit and supplies KIT Dispense based on patient and insurance preference. Use up to four times daily as directed. (prediabetes hypoglycemia).  . cetirizine (ZYRTEC) 10 MG tablet Take 1 tablet (10 mg total) by mouth daily.  . Cholecalciferol (VITAMIN D) 50 MCG (2000 UT) CAPS Take 1 capsule by mouth daily.  . fluticasone (FLONASE) 50 MCG/ACT nasal spray Place 1-2 sprays into both nostrils daily as needed.   . furosemide (LASIX) 40 MG tablet TAKE 1 TABLET (40 MG TOTAL) BY MOUTH DAILY.  Marland Kitchen levETIRAcetam (KEPPRA) 500 MG tablet Take 1,500 mg by mouth 2 (two) times daily.   Marland Kitchen LORazepam (ATIVAN) 1 MG tablet Take 1 mg by mouth every 6 (six) hours as needed for seizure.   . mirabegron ER (MYRBETRIQ) 50 MG TB24 tablet Take 50 mg by mouth daily.  . nitroGLYCERIN (NITROSTAT) 0.4 MG SL tablet Place 1 tablet (0.4 mg total) under the tongue every 5 (five) minutes as needed for chest pain.  . NON FORMULARY  CPAP Machine: authorized by [place] in [place]  . nystatin ointment (MYCOSTATIN) Apply 1 application topically 3 (three) times daily.  Marland Kitchen omeprazole (PRILOSEC) 40 MG capsule Take 1 capsule (40 mg total) by mouth daily.  Marland Kitchen pyridOXINE (VITAMIN B-6) 100 MG tablet Take 100 mg by mouth daily.  . rosuvastatin (CRESTOR) 5 MG tablet TAKE 1 TABLET BY MOUTH 3 TIMES A WEEK AND INCREASE AS TOLERATED  . Tamsulosin HCl (FLOMAX) 0.4 MG CAPS Take 0.4 mg by mouth every evening.   . thiamine 100 MG tablet Take 100 mg by mouth daily.  Marland Kitchen VIMPAT 200 MG TABS tablet Take 200 mg by mouth 2 (two) times daily.  . vitamin B-12 (CYANOCOBALAMIN) 1000 MCG tablet Take 1,000 mcg by mouth daily.      Allergies:   Haloperidol, Rosuvastatin, Simvastatin, Lisinopril, Nifedipine, and Pravastatin   Social History   Socioeconomic History  . Marital status: Married    Spouse name: Not on file  . Number of children: 1  . Years of education: Not on file  .  Highest education level: Not on file  Occupational History  . Occupation: retired/ Education officer, community  Tobacco Use  . Smoking status: Former Smoker    Packs/day: 0.00    Years: 30.00    Pack years: 0.00    Types: Pipe    Quit date: 07/04/2008    Years since quitting: 11.3  . Smokeless tobacco: Former Network engineer and Sexual Activity  . Alcohol use: Yes    Alcohol/week: 0.0 standard drinks    Comment: Very Infrequently   . Drug use: No  . Sexual activity: Not Currently  Other Topics Concern  . Not on file  Social History Narrative  . Not on file   Social Determinants of Health   Financial Resource Strain:   . Difficulty of Paying Living Expenses:   Food Insecurity:   . Worried About Charity fundraiser in the Last Year:   . Arboriculturist in the Last Year:   Transportation Needs:   . Film/video editor (Medical):   Marland Kitchen Lack of Transportation (Non-Medical):   Physical Activity: Insufficiently Active  . Days of Exercise per Week: 3 days  . Minutes of Exercise per Session: 30 min  Stress: No Stress Concern Present  . Feeling of Stress : Only a little  Social Connections: Unknown  . Frequency of Communication with Friends and Family: Not on file  . Frequency of Social Gatherings with Friends and Family: Not on file  . Attends Religious Services: Not on file  . Active Member of Clubs or Organizations: Not on file  . Attends Archivist Meetings: Not on file  . Marital Status: Married     Family History:  The patient's family history includes COPD in his father; Coronary artery disease in his father; Dementia (age of onset: 76) in his sister; Diabetes in his brother and sister; Hepatitis in his mother. There is no history of Colon cancer.  ROS:   Please see the history of present illness.  All other systems are reviewed and otherwise negative.    EKGs/Labs/Other Studies Reviewed:    Studies reviewed are outlined and summarized above. Reports included below if  pertinent.  Cardiac 01/2018 Conclusion    The left ventricular systolic function is normal.  LV end diastolic pressure is normal.  The left ventricular ejection fraction is 55-65% by visual estimate.    1. Minimal nonobstructive CAD 2. Normal LV function 3. Normal LVEDP  Plan: medical management.   Addendum: during recovery patient developed Second degree AV block Mobitz type 1 with HR into the 40s. He is asymptomatic. No prior history of dizziness or syncope. On no rate slowing medication. Baseline Ecg with very long PR interval. He is instructed to notify us if he develops symptoms. May need Holter monitor as outpatient.     2D Echo 01/2016  Study Conclusions   - Left ventricle: The cavity size was normal. There was moderate  concentric hypertrophy. Systolic function was normal. The  estimated ejection fraction was in the range of 60% to 65%. Wall  motion was normal; there were no regional wall motion  abnormalities. Features are consistent with a pseudonormal left  ventricular filling pattern, with concomitant abnormal relaxation  and increased filling pressure (grade 2 diastolic dysfunction).  - Aortic valve: There was mild regurgitation.   Transthoracic echocardiography. M-mode, complete 2D, spectral  Doppler, and color Doppler. Birthdate: Patient birthdate:  11-29-1945. Age: Patient is 74 yr old. Sex: Gender: male.  BMI: 38.9 kg/m^2. Blood pressure:   130/52 Patient status:  Inpatient. Study date: Study date: 01/06/2016. Study time: 09:27  AM. Location: Bedside.    EKG:  EKG is not ordered today, but reviewed from 10/15/19 which showed AV pacing  Recent Labs: 03/18/2019: NT-Pro BNP 292 08/21/2019: ALT 46; BUN 12; Creatinine, Ser 0.99; Hemoglobin 15.8; Platelets 173.0; Potassium 3.9; Sodium 136; TSH 3.24  Recent Lipid Panel    Component Value Date/Time   CHOL 150 03/18/2019 1011   CHOL 159 03/27/2013 0818   TRIG 146 03/18/2019 1011     TRIG 127 03/27/2013 0818   HDL 46 03/18/2019 1011   HDL 45 03/27/2013 0818   CHOLHDL 3.3 03/18/2019 1011   CHOLHDL 3 04/18/2018 1023   VLDL 26.2 04/18/2018 1023   LDLCALC 79 03/18/2019 1011   LDLCALC 89 03/27/2013 0818   LDLDIRECT 87.6 09/19/2012 1206    PHYSICAL EXAM:    VS:  BP 112/64   Pulse 74   Ht 5' 8"  (1.727 m)   Wt 261 lb (118.4 kg)   SpO2 95%   BMI 39.68 kg/m   BMI: Body mass index is 39.68 kg/m.  GEN: Well nourished, well developed obese WM, in no acute distress HEENT: normocephalic, atraumatic Neck: no JVD, carotid bruits, or masses Cardiac: RRR; no murmurs, rubs, or gallops, minimal sockline edema Respiratory:  clear to auscultation bilaterally, normal work of breathing GI: soft, nontender, nondistended, + BS MS: no deformity or atrophy Skin: warm and dry, no rash Neuro:  Alert and Oriented x 3, Strength and sensation are intact, follows commands Psych: euthymic mood, full affect  Wt Readings from Last 3 Encounters:  11/08/19 261 lb (118.4 kg)  10/15/19 261 lb 9.6 oz (118.7 kg)  08/21/19 261 lb (118.4 kg)     ASSESSMENT & PLAN:   1. Chronic diastolic CHF - clinically he appears euvolemic and without significant symptoms of acute volume overload. I suspect the changes they saw on an AP portable were likely related to the acute physiologic stress of the seizure at the time. His lungs are clear on exam today. Would continue current dose of Lasix and encourage physical activity as tolerated. 2. Bradycardia s/p PPM - this is followed by Dr. Curt Bears. Per ED visit, EKG showed atrial sensed V pacing. Last interrogation 09/2019 showed NSR with 6 seconds of AF (subclinical). No changes made at that time. 3. Essential HTN - blood pressure is controlled. Continue current regimen. 4. OSA on  CPAP - he now follows with Dr. Radford Pax for this. He states he does tend to take his mask off in the middle of the night for reasons unknown to him. He states that his neurologist  suggested there are 3 different settings they could try, and to speak with his sleep doctor about an alternative setting. I will route to Dr. Radford Pax for input. 5. Atypical chest pain - no change from prior baseline, present back to when he had caths that were unrevealing. Has not been taking omeprazole as instructed. He will resume. 6. Morbid obesity - he is struggling to enact any weight loss. We discussed referral to [facility] [place] and he is agreeable, given long term implications to his other health problems.  Disposition: F/u with Dr. [name] as previously suggested in 04/2020, Dr. Radford Pax in 04/2020, and Dr. Curt Bears in 06/2020.  Medication Adjustments/Labs and Tests Ordered: Current medicines are reviewed at length with the patient today.  Concerns regarding medicines are outlined above. Medication changes, Labs and Tests ordered today are summarized above and listed in the Patient Instructions accessible in Encounters.   Signed, Charlie Pitter, PA-C  11/08/2019 3:47 PM    [facility] [address], [place], [place]  [address] Phone: [phone]; Fax: [phone]

## 2019-11-08 ENCOUNTER — Telehealth: Payer: Self-pay | Admitting: Physician Assistant

## 2019-11-08 ENCOUNTER — Other Ambulatory Visit: Payer: Self-pay

## 2019-11-08 ENCOUNTER — Encounter: Payer: Self-pay | Admitting: Physician Assistant

## 2019-11-08 ENCOUNTER — Ambulatory Visit (INDEPENDENT_AMBULATORY_CARE_PROVIDER_SITE_OTHER): Payer: Medicare Other | Admitting: Physician Assistant

## 2019-11-08 VITALS — BP 112/64 | HR 74 | Ht 68.0 in | Wt 261.0 lb

## 2019-11-08 DIAGNOSIS — Z9989 Dependence on other enabling machines and devices: Secondary | ICD-10-CM

## 2019-11-08 DIAGNOSIS — R001 Bradycardia, unspecified: Secondary | ICD-10-CM | POA: Diagnosis not present

## 2019-11-08 DIAGNOSIS — G4733 Obstructive sleep apnea (adult) (pediatric): Secondary | ICD-10-CM | POA: Diagnosis not present

## 2019-11-08 DIAGNOSIS — Z950 Presence of cardiac pacemaker: Secondary | ICD-10-CM

## 2019-11-08 DIAGNOSIS — I10 Essential (primary) hypertension: Secondary | ICD-10-CM

## 2019-11-08 DIAGNOSIS — I25118 Atherosclerotic heart disease of native coronary artery with other forms of angina pectoris: Secondary | ICD-10-CM | POA: Diagnosis not present

## 2019-11-08 DIAGNOSIS — I5032 Chronic diastolic (congestive) heart failure: Secondary | ICD-10-CM | POA: Diagnosis not present

## 2019-11-08 NOTE — Patient Instructions (Addendum)
Medication Instructions:  Your physician recommends that you continue on your current medications as directed. Please refer to the Current Medication list given to you today.  *If you need a refill on your cardiac medications before your next appointment, please call your pharmacy*   Lab Work: None ordered  If you have labs (blood work) drawn today and your tests are completely normal, you will receive your results only by: Marland Kitchen MyChart Message (if you have MyChart) OR . A paper copy in the mail If you have any lab test that is abnormal or we need to change your treatment, we will call you to review the results.   Testing/Procedures: None ordered   Follow-Up:  You have been referred to [facility] [place]  Keep follow up up as scheduled with Dr. [name] and Dr. Curt Bears   IT IS IMPORTANT TO LET YOUR DOCTOR KNOW EARLY ON IF YOU ARE HAVING SYMPTOMS SO WE CAN HELP YOU!

## 2019-11-08 NOTE — Telephone Encounter (Signed)
I have no idea.  This is a common problem and unfortunately there is not a lot we can do when they take if off in the middle of the night.   Gae Bon please find out what kind of mask he is using and get a download for me

## 2019-11-08 NOTE — Telephone Encounter (Signed)
   Hi Traci, I saw this patient in clinic today and he follows with you for sleep apnea (you saw 04/2019 with plan for f/u in 04/2020). He recently saw his neurologist for his breakthrough seizures. At their visit he says he told them he does tend to take his CPAP mask off in the middle of the night for reasons unknown to him. He states that his neurologist suggested there are 3 different settings they could try, and to speak with his sleep doctor about an alternative setting. Do you know what he is referencing or can you help? Thanks, [employer]

## 2019-11-12 NOTE — Telephone Encounter (Signed)
Please let pt know that we have relayed CPAP question to sleep team and awaiting Gae Bon to reach out. Scarlett Portlock PA-C

## 2019-11-13 NOTE — Telephone Encounter (Signed)
Call placed to pt re: message below.  Left a message for pt to call back.

## 2019-11-14 NOTE — Telephone Encounter (Signed)
Reached out to patient spoke to his wife who states his neurologist thinks an APAP would help him better than the set pressure machine he has. I am unable to get a download from the [place] until the patient goes there for an appointment or to request a download and obtain a copy as he is the only one they will give a copy to. His spouse [name] will try to figure out how to best to help [place] help him. Patient wears a full face mask.

## 2019-11-25 MED FILL — FUROSEMIDE 40 MG TAB: 40 | 90 days supply | Qty: 90 | Fill #2

## 2019-11-27 ENCOUNTER — Encounter: Payer: Self-pay | Admitting: Internal Medicine

## 2019-11-27 ENCOUNTER — Ambulatory Visit (INDEPENDENT_AMBULATORY_CARE_PROVIDER_SITE_OTHER): Payer: Medicare Other | Admitting: Internal Medicine

## 2019-11-27 ENCOUNTER — Other Ambulatory Visit: Payer: Self-pay

## 2019-11-27 ENCOUNTER — Ambulatory Visit: Payer: Medicare Other | Admitting: Internal Medicine

## 2019-11-27 VITALS — BP 130/72 | HR 66 | Temp 98.5°F | Resp 18 | Ht 68.0 in | Wt 257.0 lb

## 2019-11-27 DIAGNOSIS — R7303 Prediabetes: Secondary | ICD-10-CM | POA: Diagnosis not present

## 2019-11-27 DIAGNOSIS — Z950 Presence of cardiac pacemaker: Secondary | ICD-10-CM | POA: Diagnosis not present

## 2019-11-27 DIAGNOSIS — M170 Bilateral primary osteoarthritis of knee: Secondary | ICD-10-CM | POA: Diagnosis not present

## 2019-11-27 DIAGNOSIS — G40909 Epilepsy, unspecified, not intractable, without status epilepticus: Secondary | ICD-10-CM

## 2019-11-27 DIAGNOSIS — I25118 Atherosclerotic heart disease of native coronary artery with other forms of angina pectoris: Secondary | ICD-10-CM

## 2019-11-27 NOTE — Assessment & Plan Note (Addendum)
Following with neuro at [facility] On keppra and vimpat - keppra dose just increased ? cause - UA neg in ED, cbc, cmp Normal at neuro f/u No symptoms to suggest an infection at this time No further evaluation needed Continue current meds and follow up with neuro Stressed good sleep and avoiding dehydration

## 2019-11-27 NOTE — Assessment & Plan Note (Signed)
Chronic B/l knees Continue regular exercise Work on weight loss Tylenol as needed

## 2019-11-27 NOTE — Patient Instructions (Signed)
  Medications reviewed and updated.  Changes include :   none    Please followup in 6 months   

## 2019-11-27 NOTE — Assessment & Plan Note (Signed)
Chronic Lab Results  Component Value Date   HGBA1C 6.2 04/18/2018   Walking regularly, eating healthy Has lost some weight  Stressed continue weight loss efforts Will recheck a1c at next visit

## 2019-11-27 NOTE — Progress Notes (Signed)
Subjective:    Patient ID: Jeff Weaver, male    DOB: 01-03-1946, 74 y.o.   MRN: [id number]  HPI The patient is here for an acute visit.  Seizure d/o:  He had two years w/o seizures.  He had several breakthrough seizures 10/28/19.  He went to the ED and his Keppra was increased.  UA was negative for infection but there was some blood. There was no obvious cause. He saw neurology on 5/4 and no changes in medications were made.  CBC, cmp and vitamin D were normal at that time.  He is currently taking vimpat 200 mg BID, Keppra 1500 mg BID, clonazepam 1 mg in cheek for seizure > 4 min log or after 2nd seizure in 24 hours.  In unable to use clonazepam nayzilam in 1 nostril, second dose in other nostril.  The increased Keppra has caused some increased agitation and confusion.    He did walk that morning and it was a long walk - his wife wonders if he may have been dehydrated.     Prediabetes:  He is compliant with a low sugar/carbohydrate diet.  He is exercising regularly.  He has been walking.   His knees are hurting him more due to OA.  He takes tylenol as needed.  Medications and allergies reviewed with patient and updated if appropriate.  Patient Active Problem List   Diagnosis Date Noted  . Pacemaker 11/27/2019  . Fatigue 07/09/2019  . SSS (sick sinus syndrome) ([place]) 07/02/2018  . Bilateral shoulder pain 06/14/2018  . Upper airway cough syndrome vs cough variant asthma 03/20/2018  . Acute midline low back pain without sciatica 12/25/2017  . Depression 10/17/2017  . CAD (coronary artery disease) 08/15/2017  . Chest pain 07/17/2017  . SOBOE (shortness of breath on exertion) 06/08/2017  . Bilateral sensorineural hearing loss 06/01/2017  . Nasal turbinate hypertrophy 06/01/2017  . Dysphagia 05/31/2017  . Osteoporosis 01/30/2017  . History of cerebral hemorrhage 12/28/2016  . Seborrheic dermatitis 07/25/2016  . Cognitive communication deficit 06/28/2016  . Hypertensive heart  disease 06/28/2016  . Vitamin B 12 deficiency 03/16/2016  . Memory difficulties 03/13/2016  . 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 Orthostatic hypotension 01/05/2016  . Benign prostatic hyperplasia with urinary obstruction 08/04/2014  . Urinary urgency 08/04/2014  . Gastroesophageal reflux disease 07/14/2014  . Polymyalgia rheumatica ([place]) 05/28/2014  . 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 History of non-ST elevation myocardial infarction (NSTEMI) 11/25/2013  . SBE (subacute bacterial endocarditis) prophylaxis candidate 11/24/2013  . Aphasia 07/17/2013  . 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 seizures, not intractable, without status epilepticus ([place]) 07/17/2013  . Subdural hemorrhage ([place]) 07/17/2013  . 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[place]) 07/17/2013  . Pre-diabetes 03/26/2013  . Increased frequency of urination 10/31/2012  . Nocturia 10/31/2012  . History of cerebral aneurysm repair 10/18/2012  . History of stroke without residual deficits 10/18/2012  . Blepharitis of both eyes 09/27/2012  . 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 Seizure disorder ([place]) 07/23/2012  . Entropion 07/10/2012  . Hyperopia with astigmatism 07/10/2012  . Combined senile cataract 04/13/2012  . Presbyopia 04/13/2012  . 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 artery ([place]) 03/01/2012  . Cognitive safety issue 02/20/2012  . Nonruptured cerebral aneurysm 11/23/2011  . Obstructive sleep apnea syndrome 08/09/2011  . Hyperlipidemia 02/03/2009  . HTN (hypertension) 02/03/2009  . OSTEOARTHRITIS 02/03/2009  . History of Crohn's disease 04/17/2008    Current Outpatient Medications on File Prior to Visit  Medication Sig Dispense Refill  . acetaminophen (TYLENOL) 325 MG tablet Take 325-650 mg by mouth every 6 (six) hours as needed for headache.    Marland Kitchen amoxicillin (AMOXIL) 500 MG capsule Take 2,000 mg by mouth as directed. Take 2000 mg by mouth 1 hour prior to dental  appointments    . aspirin EC 81 MG tablet Take 1 tablet (81 mg total) by mouth daily. 90 tablet 3  . blood glucose meter kit and supplies KIT Dispense based on patient and insurance preference. Use up to four times daily as directed. (prediabetes hypoglycemia). 1 each 0  . cetirizine (ZYRTEC) 10 MG tablet Take 1 tablet (10 mg total) by mouth daily. 30 tablet 11  . Cholecalciferol (VITAMIN D) 50 MCG (2000 UT) CAPS Take 1 capsule by mouth daily.    . fluticasone (FLONASE) 50 MCG/ACT nasal spray Place 1-2 sprays into both nostrils daily as needed.     . furosemide (LASIX) 40 MG tablet TAKE 1 TABLET (40 MG TOTAL) BY MOUTH DAILY. 90 tablet 2  . levETIRAcetam (KEPPRA) 500 MG tablet Take 1,500 mg by mouth 2 (two) times daily.     Marland Kitchen LORazepam (ATIVAN) 1 MG tablet Take 1 mg by mouth every 6 (six) hours as needed for seizure.     . mirabegron ER (MYRBETRIQ) 50 MG TB24 tablet Take 50 mg by mouth daily.    . nitroGLYCERIN (NITROSTAT) 0.4 MG SL tablet Place 1 tablet (0.4 mg total) under the tongue every 5 (five) minutes as needed for chest pain. 25 tablet 3  . NON FORMULARY CPAP Machine: authorized by [facility] in [place]    . nystatin ointment (MYCOSTATIN) Apply 1 application topically 3 (three) times daily. 30 g 1  . omeprazole (PRILOSEC) 40 MG capsule Take 1 capsule (40 mg total) by mouth daily. 90 capsule 2  . pyridOXINE (VITAMIN B-6) 100 MG tablet Take 100 mg by mouth daily.    . rosuvastatin (CRESTOR) 5 MG tablet TAKE 1 TABLET BY MOUTH 3 TIMES A WEEK AND INCREASE AS TOLERATED 45 tablet 2  . Tamsulosin HCl (FLOMAX) 0.4 MG CAPS Take 0.4 mg by mouth every evening.     . thiamine 100 MG tablet Take 100 mg by mouth daily.    Marland Kitchen VIMPAT 200 MG TABS tablet Take 200 mg by mouth 2 (two) times daily.    . vitamin B-12 (CYANOCOBALAMIN) 1000 MCG tablet Take 1,000 mcg by mouth daily.     No current facility-administered medications on file prior to visit.    Past Medical History:  Diagnosis Date  . Aortic insufficiency     a. mild 2017 echo.  . Cerebral aneurysm    a. s/p clipping at [facility] in 8/13 c/b short-term memory loss, cerebral hemorrhage and seizure disorder s/p VP shunt.  . Cerebral hemorrhage ([place])   . Chronic diastolic CHF (congestive heart failure) ([place])   . CVA (cerebral infarction)   . Diverticulosis   . Enteritis (regional)    Dr Olevia Perches  . First degree AV block   . GI bleed   . HTN (hypertension)   . Hyperlipidemia   . Hypertensive heart disease   . IBS (irritable bowel syndrome)   . Mild CAD    Cardiac cath 01/12/18 showed minimal nonobstructive CAD, normal LVEF, normal LVEDP.  Marland Kitchen NSVT (nonsustained ventricular tachycardia) ([place])   . Orthostatic hypotension   . Pre-diabetes   . PVC's (premature ventricular contractions)   . 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  Rheumatoid arthritis(714.0)    dxed in [employer] in 1980s  . Second degree AV block, Mobitz type I   . Seizures ([place])   . Sinus bradycardia   . Sleep apnea   . SSS (sick sinus syndrome) ([place])    a. s/p PPM 07/02/18.    Past Surgical History:  Procedure Laterality Date  . CHOLECYSTECTOMY N/A 01/07/2016   Procedure: LAPAROSCOPIC CHOLECYSTECTOMY ;  Surgeon: Coralie Keens, MD;  Location: [facility];  Service: General;  Laterality: N/A;  . cns shunt  02/23/12  . COLONOSCOPY W/ POLYPECTOMY  1978   negative 2009, Dr Olevia Perches. Due 2014  . CRANIOTOMY  02/02/12   Dr Harvel Ricks, [place] of aneurysm  . 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 HERNIA REPAIR    . LEFT HEART CATH AND CORONARY ANGIOGRAPHY N/A 01/12/2018   Procedure: LEFT HEART CATH AND CORONARY ANGIOGRAPHY;  Surgeon: Martinique, Peter M, MD;  Location: [facility];  Service: Cardiovascular;  Laterality: N/A;  . LEFT HEART CATHETERIZATION WITH CORONARY ANGIOGRAM N/A 11/26/2013   Procedure: LEFT HEART CATHETERIZATION WITH CORONARY ANGIOGRAM;  Surgeon: [name], MD;  Location[facility]  CATH [facility];  Service: Cardiovascular;  Laterality: N/A;  . NOSE SURGERY    . PACEMAKER IMPLANT N/A 07/02/2018   Procedure: PACEMAKER IMPLANT;  Surgeon: Constance Haw, MD;  Location: [facility];  Service: Cardiovascular;  Laterality: N/A;  . SHOULDER SURGERY  1997  . TONSILLECTOMY AND ADENOIDECTOMY    . VENTRICULOPERITONEAL SHUNT  02-23-12   INSERTION OF RIGHT FRONTAL VENTRICULOPERITONEAL SHUNT WITH A CODMAN HAKIM PROGRAMMABLE VALVE    Social History   Socioeconomic History  . Marital status: Married    Spouse name: Not on file  . Number of children: 1  . Years of education: Not on file  . Highest education level: Not on file  Occupational History  . Occupation: retired/ Education officer, community  Tobacco Use  . Smoking status: Former Smoker    Packs/day: 0.00    Years: 30.00    Pack years: 0.00    Types: Pipe    Quit date: 07/04/2008    Years since quitting: 11.4  . Smokeless tobacco: Former Network engineer and Sexual Activity  . Alcohol use: Yes    Alcohol/week: 0.0 standard drinks    Comment: Very Infrequently   . Drug use: No  . Sexual activity: Not Currently  Other Topics Concern  . Not on file  Social History Narrative  . Not on file   Social Determinants of Health   Financial Resource Strain:   . Difficulty of Paying Living Expenses:   Food Insecurity:   . Worried About Charity fundraiser in the Last Year:   . Arboriculturist in the Last Year:   Transportation Needs:   . Film/video editor (Medical):   Marland Kitchen Lack of Transportation (Non-Medical):   Physical Activity: Insufficiently Active  . Days of Exercise per Week: 3 days  . Minutes of Exercise per Session: 30 min  Stress: No Stress Concern Present  . Feeling of Stress : Only a little  Social Connections: Unknown  . Frequency of Communication with Friends and Family: Not on file  . Frequency of Social Gatherings with Friends and Family: Not on file  . Attends Religious Services: Not on file  . Active Member  of Clubs or Organizations: Not on file  . Attends Archivist Meetings: Not on  file  . Marital Status: Married    Family History  Problem Relation Age of Onset  . COPD Father   . Coronary artery disease Father        MI in 12s  . Hepatitis Mother   . Diabetes Sister   . Dementia Sister 57  . Diabetes Brother   . Colon cancer Neg Hx     Review of Systems  Constitutional: Negative for chills and fever.  HENT: Positive for sneezing. Negative for congestion and sore throat.   Respiratory: Positive for cough (chronic). Negative for shortness of breath and wheezing.   Cardiovascular: Positive for palpitations (rare). Negative for chest pain and leg swelling.       Objective:   Vitals:   11/27/19 1519  BP: 130/72  Pulse: 66  Resp: 18  Temp: 98.5 F (36.9 C)  SpO2: 95%   BP Readings from Last 3 Encounters:  11/27/19 130/72  11/08/19 112/64  10/15/19 126/82   Wt Readings from Last 3 Encounters:  11/27/19 257 lb (116.6 kg)  11/08/19 261 lb (118.4 kg)  10/15/19 261 lb 9.6 oz (118.7 kg)   Body mass index is 39.08 kg/m.   Physical Exam    Constitutional: Appears well-developed and well-nourished. No distress.  Head: Normocephalic and atraumatic.  Neck: Neck supple. No tracheal deviation present. No thyromegaly present.  No cervical lymphadenopathy Cardiovascular: Normal rate, regular rhythm and normal heart sounds.  No murmur heard. No carotid bruit .  No edema Pulmonary/Chest: Effort normal and breath sounds normal. No respiratory distress. No has no wheezes. No rales.  Abdomen: soft, NT, ND Skin: Skin is warm and dry. Not diaphoretic.  Psychiatric: Normal mood and affect. Behavior is normal.       Assessment & Plan:    I spent 30 minutes in preparing to see the patient by review of recent labs, imaging and procedures, obtaining and reviewing separately obtained history, communicating with the patient and his wife  See Problem List for Assessment and  Plan of chronic medical problems.    This visit occurred during the SARS-CoV-2 public health emergency.  Safety protocols were in place, including screening questions prior to the visit, additional usage of staff PPE, and extensive cleaning of exam room while observing appropriate contact time as indicated for disinfecting solutions.

## 2019-12-19 ENCOUNTER — Encounter: Payer: Self-pay | Admitting: Internal Medicine

## 2019-12-24 ENCOUNTER — Telehealth: Payer: Self-pay

## 2019-12-24 NOTE — Telephone Encounter (Signed)
Informed patient's wife that we will keep the same procedure date but patient will have an Endo/Colon instead.

## 2019-12-24 NOTE — Telephone Encounter (Signed)
Changed appt from propofol colon to propofol endo colon on 02/25/20 at 9:30 am.    17 hours ago (3:49 PM)     Please add this email from Mrs. Seever to [name], DOB 09/04/1945, medical record  He is having reflux symptoms but also has a history of Crohn's disease and is overdue for colorectal cancer screening He did not complete Cologuard as we discussed in office visit and now prefers colonoscopy  We can proceed to upper endoscopy to evaluate reflux/GERD and also screening colonoscopy in the [place].  This can be scheduled in the next available double procedure slot Thanks JMP       Documentation   You routed conversation to Pyrtle, Lajuan Lines, MD 4 days ago  Merilyn Baba, Lajuan Lines, MD 4 days ago   Dr. Hilarie Fredrickson,   Jeff Weaver did not complete the Cologuard or Lab test after his visit with you.  He has been having discomfort in his belly and abdomen and reflux.  Yesterday I called to schedule a Colonoscopy and the first available was on August 24, and pre-visit  August 11.    Am requesting that he have Endoscopy at the same visit. (Was told you have to request the need for this with another appointment and possible change in date for colonoscopy).     Questions:  1) Should we try to move up the date since it has been so long since Jeff Weaver has has this procedure and he is having pain and discomfort , and 2) can he have the upper procedure done  at same time?  Thanks for your advice!  Liz Malady

## 2019-12-25 ENCOUNTER — Telehealth: Payer: Self-pay | Admitting: Internal Medicine

## 2019-12-25 NOTE — Telephone Encounter (Signed)
Patient's wife called to get an appointment with Dr. Quay Burow tomorrow, wife states patient experiencing dizziness upon standing for the last two days.  Called transferred to team health for assessment

## 2019-12-29 NOTE — Patient Instructions (Addendum)
Follow up with your eye doctor for your blurry vision.   Blood work was ordered.     Medications reviewed and updated.  Changes include :   none    A referral was ordered for psychiatry and physical therapy.     Someone will call you to schedule these appointments.

## 2019-12-29 NOTE — Progress Notes (Signed)
Subjective:    Patient ID: Jeff Weaver, male    DOB: 10/29/1945, 74 y.o.   MRN: [id number]  HPI The patient is here for an acute visit.  He is here with his daughter.     Last week he did not feel good for 3-4 days.  He did not feel like doing anything.  He felt unbalanced.  He did have dizziness when he laid down.  He felt he had to hold onto something.  He felt grouchy as well.  He just wanted to be left alone.  He felt like he had a fever at times - he felt warm and later would feel cool.  His temp was always normal. The roof of his mouth stayed dry.  He had to drink more.  He still does not feel good.    Some of the above symptoms are chronic.  His body sometimes feels cold, sometimes feel hot - he feels his arms.  This is not new.    He gets a bubbling sound in his abdomen.  He denies abdominal pain, constipation or diarrhea.    He is drinking a good amount of water.     Medications and allergies reviewed with patient and updated if appropriate.  Patient Active Problem List   Diagnosis Date Noted  . Pacemaker 11/27/2019  . Fatigue 07/09/2019  . SSS (sick sinus syndrome) ([place]) 07/02/2018  . Bilateral shoulder pain 06/14/2018  . Upper airway cough syndrome vs cough variant asthma 03/20/2018  . Acute midline low back pain without sciatica 12/25/2017  . Depression 10/17/2017  . CAD (coronary artery disease), hx of NSTEMI 08/15/2017  . Chest pain 07/17/2017  . SOBOE (shortness of breath on exertion) 06/08/2017  . Bilateral sensorineural hearing loss 06/01/2017  . Nasal turbinate hypertrophy 06/01/2017  . Dysphagia 05/31/2017  . Osteoporosis 01/30/2017  . History of cerebral hemorrhage 12/28/2016  . Seborrheic dermatitis 07/25/2016  . Cognitive communication deficit 06/28/2016  . Hypertensive heart disease 06/28/2016  . Vitamin B 12 deficiency 03/16/2016  . Memory difficulties 03/13/2016  . 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 Orthostatic hypotension  01/05/2016  . Benign prostatic hyperplasia with urinary obstruction 08/04/2014  . Urinary urgency 08/04/2014  . Gastroesophageal reflux disease 07/14/2014  . Polymyalgia rheumatica ([place]) 05/28/2014  . 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 SBE (subacute bacterial endocarditis) prophylaxis candidate 11/24/2013  . Aphasia 07/17/2013  . 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 seizures, not intractable, without status epilepticus ([place]) 07/17/2013  . Subdural hemorrhage ([place]) 07/17/2013  . 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[place]) 07/17/2013  . Pre-diabetes 03/26/2013  . Increased frequency of urination 10/31/2012  . Nocturia 10/31/2012  . History of cerebral aneurysm repair 10/18/2012  . History of stroke without residual deficits 10/18/2012  . Blepharitis of both eyes 09/27/2012  . 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 Seizure disorder ([place]) 07/23/2012  . Entropion 07/10/2012  . Hyperopia with astigmatism 07/10/2012  . Combined senile cataract 04/13/2012  . Presbyopia 04/13/2012  . 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 artery ([place]) 03/01/2012  . Cognitive safety issue 02/20/2012  . Nonruptured cerebral aneurysm 11/23/2011  . Obstructive sleep apnea syndrome 08/09/2011  . Hyperlipidemia 02/03/2009  . HTN (hypertension) 02/03/2009  . Osteoarthritis 02/03/2009  . History of Crohn's disease 04/17/2008    Current Outpatient Medications on File Prior to Visit  Medication Sig Dispense Refill  . acetaminophen (TYLENOL) 325 MG tablet Take 325-650 mg by mouth every 6 (six) hours as needed for headache.    Marland Kitchen  amoxicillin (AMOXIL) 500 MG capsule Take 2,000 mg by mouth as directed. Take 2000 mg by mouth 1 hour prior to dental appointments    . aspirin EC 81 MG tablet Take 1 tablet (81 mg total) by mouth daily. 90 tablet 3  . blood glucose meter kit and supplies KIT Dispense based on patient and insurance preference. Use up to four times daily as directed. (prediabetes  hypoglycemia). 1 each 0  . cetirizine (ZYRTEC) 10 MG tablet Take 1 tablet (10 mg total) by mouth daily. 30 tablet 11  . Cholecalciferol (VITAMIN D) 50 MCG (2000 UT) CAPS Take 1 capsule by mouth daily.    . fluticasone (FLONASE) 50 MCG/ACT nasal spray Place 1-2 sprays into both nostrils daily as needed.     . furosemide (LASIX) 40 MG tablet TAKE 1 TABLET (40 MG TOTAL) BY MOUTH DAILY. 90 tablet 2  . levETIRAcetam (KEPPRA) 500 MG tablet Take 1,500 mg by mouth 2 (two) times daily.     Marland Kitchen LORazepam (ATIVAN) 1 MG tablet Take 1 mg by mouth every 6 (six) hours as needed for seizure.     . mirabegron ER (MYRBETRIQ) 50 MG TB24 tablet Take 50 mg by mouth daily.    . nitroGLYCERIN (NITROSTAT) 0.4 MG SL tablet Place 1 tablet (0.4 mg total) under the tongue every 5 (five) minutes as needed for chest pain. 25 tablet 3  . NON FORMULARY CPAP Machine: authorized by [facility] [place]    . nystatin ointment (MYCOSTATIN) Apply 1 application topically 3 (three) times daily. 30 g 1  . omeprazole (PRILOSEC) 40 MG capsule Take 1 capsule (40 mg total) by mouth daily. 90 capsule 2  . pyridOXINE (VITAMIN B-6) 100 MG tablet Take 100 mg by mouth daily.    . rosuvastatin (CRESTOR) 5 MG tablet TAKE 1 TABLET BY MOUTH 3 TIMES A WEEK AND INCREASE AS TOLERATED 45 tablet 2  . Tamsulosin HCl (FLOMAX) 0.4 MG CAPS Take 0.4 mg by mouth every evening.     . thiamine 100 MG tablet Take 100 mg by mouth daily.    Marland Kitchen VIMPAT 200 MG TABS tablet Take 200 mg by mouth 2 (two) times daily.    . vitamin B-12 (CYANOCOBALAMIN) 1000 MCG tablet Take 1,000 mcg by mouth daily.     No current facility-administered medications on file prior to visit.    Past Medical History:  Diagnosis Date  . Aortic insufficiency    a. mild 2017 echo.  . Cerebral aneurysm    a. s/p clipping at [facility] in 8/13 c/b short-term memory loss, cerebral hemorrhage and seizure disorder s/p VP shunt.  . Cerebral hemorrhage ([place])   . Chronic diastolic CHF (congestive heart failure)  ([place])   . CVA (cerebral infarction)   . Diverticulosis   . Enteritis (regional)    Dr Olevia Perches  . First degree AV block   . GI bleed   . HTN (hypertension)   . Hyperlipidemia   . Hypertensive heart disease   . IBS (irritable bowel syndrome)   . Mild CAD    Cardiac cath 01/12/18 showed minimal nonobstructive CAD, normal LVEF, normal LVEDP.  Marland Kitchen NSVT (nonsustained ventricular tachycardia) ([place])   . Orthostatic hypotension   . Pre-diabetes   . PVC's (premature ventricular contractions)   . 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 Rheumatoid arthritis(714.0)    dxed in [employer] in 1980s  . Second degree AV block, Mobitz type I   . Seizures ([place])   . Sinus  bradycardia   . Sleep apnea   . SSS (sick sinus syndrome) ([place])    a. s/p PPM 07/02/18.    Past Surgical History:  Procedure Laterality Date  . CHOLECYSTECTOMY N/A 01/07/2016   Procedure: LAPAROSCOPIC CHOLECYSTECTOMY ;  Surgeon: Coralie Keens, MD;  Location: [facility];  Service: General;  Laterality: N/A;  . cns shunt  02/23/12  . COLONOSCOPY W/ POLYPECTOMY  1978   negative 2009, Dr Olevia Perches. Due 2014  . CRANIOTOMY  02/02/12   Dr Harvel Ricks, [place] of aneurysm  . 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 HERNIA REPAIR    . LEFT HEART CATH AND CORONARY ANGIOGRAPHY N/A 01/12/2018   Procedure: LEFT HEART CATH AND CORONARY ANGIOGRAPHY;  Surgeon: Martinique, Peter M, MD;  Location: [facility] CV [facility];  Service: Cardiovascular;  Laterality: N/A;  . LEFT HEART CATHETERIZATION WITH CORONARY ANGIOGRAM N/A 11/26/2013   Procedure: LEFT HEART CATHETERIZATION WITH CORONARY ANGIOGRAM;  Surgeon: [name], MD;  Location: [facility] CATH LAB;  Service: Cardiovascular;  Laterality: N/A;  . NOSE SURGERY    . PACEMAKER IMPLANT N/A 07/02/2018   Procedure: PACEMAKER IMPLANT;  Surgeon: Constance Haw, MD;  Location: [facility] CV [facility];  Service: Cardiovascular;  Laterality:  N/A;  . SHOULDER SURGERY  1997  . TONSILLECTOMY AND ADENOIDECTOMY    . VENTRICULOPERITONEAL SHUNT  02-23-12   INSERTION OF RIGHT FRONTAL VENTRICULOPERITONEAL SHUNT WITH A CODMAN HAKIM PROGRAMMABLE VALVE    Social History   Socioeconomic History  . Marital status: Married    Spouse name: Not on file  . Number of children: 1  . Years of education: Not on file  . Highest education level: Not on file  Occupational History  . Occupation: retired/ Education officer, community  Tobacco Use  . Smoking status: Former Smoker    Packs/day: 0.00    Years: 30.00    Pack years: 0.00    Types: Pipe    Quit date: 07/04/2008    Years since quitting: 11.4  . Smokeless tobacco: Former Network engineer  . Vaping Use: Never used  Substance and Sexual Activity  . Alcohol use: Yes    Alcohol/week: 0.0 standard drinks    Comment: Very Infrequently   . Drug use: No  . Sexual activity: Not Currently  Other Topics Concern  . Not on file  Social History Narrative  . Not on file   Social Determinants of Health   Financial Resource Strain:   . Difficulty of Paying Living Expenses:   Food Insecurity:   . Worried About Charity fundraiser in the Last Year:   . Arboriculturist in the Last Year:   Transportation Needs:   . Film/video editor (Medical):   Marland Kitchen Lack of Transportation (Non-Medical):   Physical Activity: Insufficiently Active  . Days of Exercise per Week: 3 days  . Minutes of Exercise per Session: 30 min  Stress: No Stress Concern Present  . Feeling of Stress : Only a little  Social Connections: Unknown  . Frequency of Communication with Friends and Family: Not on file  . Frequency of Social Gatherings with Friends and Family: Not on file  . Attends Religious Services: Not on file  . Active Member of Clubs or Organizations: Not on file  . Attends Archivist Meetings: Not on file  . Marital Status: Married    Family History  Problem Relation Age of Onset  . COPD  Father   . Coronary  artery disease Father        MI in 30s  . Hepatitis Mother   . Diabetes Sister   . Dementia Sister 24  . Diabetes Brother   . Colon cancer Neg Hx     Review of Systems  Constitutional: Negative for fever.  Respiratory: Positive for cough (chronic) and shortness of breath (when he walks two blocks - chronic). Negative for wheezing.   Cardiovascular: Negative for chest pain, palpitations and leg swelling.  Gastrointestinal: Negative for abdominal pain, constipation and diarrhea.       Maybe increased gas  Musculoskeletal: Positive for gait problem (unbalanced).       Cramps in feet and legs  Neurological: Positive for dizziness (if he bends over). Negative for light-headedness and headaches.       Objective:   Vitals:   12/30/19 0835  BP: 132/80  Pulse: 83  Temp: 98 F (36.7 C)  SpO2: 95%   BP Readings from Last 3 Encounters:  12/30/19 132/80  11/27/19 130/72  11/08/19 112/64   Wt Readings from Last 3 Encounters:  12/30/19 258 lb (117 kg)  11/27/19 257 lb (116.6 kg)  11/08/19 261 lb (118.4 kg)   Body mass index is 39.23 kg/m.   Physical Exam Constitutional:      General: He is not in acute distress.    Appearance: Normal appearance. He is not ill-appearing.  HENT:     Head: Normocephalic and atraumatic.     Right Ear: Tympanic membrane, ear canal and external ear normal.     Left Ear: Tympanic membrane, ear canal and external ear normal.     Mouth/Throat:     Mouth: Mucous membranes are moist.  Eyes:     Conjunctiva/sclera: Conjunctivae normal.  Cardiovascular:     Rate and Rhythm: Normal rate and regular rhythm.     Heart sounds: Murmur (2/6 systolic) heard.   Pulmonary:     Effort: Pulmonary effort is normal. No respiratory distress.     Breath sounds: Normal breath sounds. No wheezing or rales.  Abdominal:     Palpations: Abdomen is soft.     Tenderness: There is no abdominal tenderness. There is no guarding or rebound.  Musculoskeletal:      Cervical back: Neck supple. No tenderness.     Right lower leg: Edema (1+) present.     Left lower leg: Edema (1+) present.  Lymphadenopathy:     Cervical: No cervical adenopathy.  Skin:    General: Skin is warm and dry.  Neurological:     General: No focal deficit present.     Mental Status: He is alert and oriented to person, place, and time.  Psychiatric:        Mood and Affect: Mood normal.            Assessment & Plan:    25 minutes were spent face-to-face with the patient and his daughter, reviewing his symptoms, history, discussing his multiple concerns, discussing causes and coordinating care    See Problem List for Assessment and Plan of chronic medical problems.    This visit occurred during the SARS-CoV-2 public health emergency.  Safety protocols were in place, including screening questions prior to the visit, additional usage of staff PPE, and extensive cleaning of exam room while observing appropriate contact time as indicated for disinfecting solutions.

## 2019-12-30 ENCOUNTER — Encounter: Payer: Self-pay | Admitting: Internal Medicine

## 2019-12-30 ENCOUNTER — Ambulatory Visit (INDEPENDENT_AMBULATORY_CARE_PROVIDER_SITE_OTHER): Payer: Medicare Other | Admitting: Internal Medicine

## 2019-12-30 ENCOUNTER — Other Ambulatory Visit: Payer: Self-pay

## 2019-12-30 VITALS — BP 132/80 | HR 83 | Temp 98.0°F | Ht 68.0 in | Wt 258.0 lb

## 2019-12-30 DIAGNOSIS — F3289 Other specified depressive episodes: Secondary | ICD-10-CM | POA: Diagnosis not present

## 2019-12-30 DIAGNOSIS — I25118 Atherosclerotic heart disease of native coronary artery with other forms of angina pectoris: Secondary | ICD-10-CM

## 2019-12-30 DIAGNOSIS — R42 Dizziness and giddiness: Secondary | ICD-10-CM

## 2019-12-30 DIAGNOSIS — R7303 Prediabetes: Secondary | ICD-10-CM

## 2019-12-30 DIAGNOSIS — R252 Cramp and spasm: Secondary | ICD-10-CM | POA: Diagnosis not present

## 2019-12-30 DIAGNOSIS — R5383 Other fatigue: Secondary | ICD-10-CM

## 2019-12-30 DIAGNOSIS — R2681 Unsteadiness on feet: Secondary | ICD-10-CM

## 2019-12-30 LAB — CBC WITH DIFFERENTIAL/PLATELET
Basophils Absolute: 0 10*3/uL (ref 0.0–0.1)
Basophils Relative: 0.3 % (ref 0.0–3.0)
Eosinophils Absolute: 0.1 10*3/uL (ref 0.0–0.7)
Eosinophils Relative: 1.2 % (ref 0.0–5.0)
HCT: 44 % (ref 39.0–52.0)
Hemoglobin: 15.2 g/dL (ref 13.0–17.0)
Lymphocytes Relative: 16.5 % (ref 12.0–46.0)
Lymphs Abs: 1.7 10*3/uL (ref 0.7–4.0)
MCHC: 34.7 g/dL (ref 30.0–36.0)
MCV: 92 fl (ref 78.0–100.0)
Monocytes Absolute: 0.7 10*3/uL (ref 0.1–1.0)
Monocytes Relative: 7 % (ref 3.0–12.0)
Neutro Abs: 7.5 10*3/uL (ref 1.4–7.7)
Neutrophils Relative %: 75 % (ref 43.0–77.0)
Platelets: 161 10*3/uL (ref 150.0–400.0)
RBC: 4.78 Mil/uL (ref 4.22–5.81)
RDW: 13.6 % (ref 11.5–15.5)
WBC: 10 10*3/uL (ref 4.0–10.5)

## 2019-12-30 LAB — COMPREHENSIVE METABOLIC PANEL
ALT: 44 U/L (ref 0–53)
AST: 25 U/L (ref 0–37)
Albumin: 4.1 g/dL (ref 3.5–5.2)
Alkaline Phosphatase: 41 U/L (ref 39–117)
BUN: 12 mg/dL (ref 6–23)
CO2: 31 mEq/L (ref 19–32)
Calcium: 9.7 mg/dL (ref 8.4–10.5)
Chloride: 99 mEq/L (ref 96–112)
Creatinine, Ser: 0.94 mg/dL (ref 0.40–1.50)
GFR: 78.11 mL/min (ref >60.00)
Glucose, Bld: 126 mg/dL — ABNORMAL HIGH (ref 70–99)
Potassium: 4.1 mEq/L (ref 3.5–5.1)
Sodium: 137 mEq/L (ref 135–145)
Total Bilirubin: 0.5 mg/dL (ref 0.2–1.2)
Total Protein: 7 g/dL (ref 6.0–8.3)

## 2019-12-30 LAB — TSH: TSH: 1.8 u[IU]/mL (ref 0.35–4.50)

## 2019-12-30 LAB — MAGNESIUM: Magnesium: 1.9 mg/dL (ref 1.5–2.5)

## 2019-12-30 LAB — HEMOGLOBIN A1C: Hgb A1c MFr Bld: 6.4 % (ref 4.6–6.5)

## 2019-12-30 NOTE — Assessment & Plan Note (Signed)
Chronic He has been dealing with depression for a while Would benefit from medication, but also on seizure medication which may interact Will refer to psychiatry for eval/treatment

## 2019-12-30 NOTE — Assessment & Plan Note (Signed)
Acute Having some dizziness with changes in position Likely vertigo - may be contributing to unsteadiness Referred to PT

## 2019-12-30 NOTE — Assessment & Plan Note (Signed)
Acute Cramping in feet/legs Continue increased fluids Ck - magnesium, cmp

## 2019-12-30 NOTE — Assessment & Plan Note (Signed)
Chronic Check a1c Low sugar / carb diet Stressed regular exercise  

## 2019-12-30 NOTE — Assessment & Plan Note (Signed)
Chronic Cbc, cmp, tsh Likely multifactorial

## 2019-12-30 NOTE — Assessment & Plan Note (Signed)
Acute on chronic More unsteady Walks with a cane Vertigo may be contributing Will refer for PT

## 2020-01-01 ENCOUNTER — Ambulatory Visit (INDEPENDENT_AMBULATORY_CARE_PROVIDER_SITE_OTHER): Payer: Medicare Other | Admitting: *Deleted

## 2020-01-01 DIAGNOSIS — I495 Sick sinus syndrome: Secondary | ICD-10-CM

## 2020-01-02 ENCOUNTER — Other Ambulatory Visit (HOSPITAL_BASED_OUTPATIENT_CLINIC_OR_DEPARTMENT_OTHER): Payer: Self-pay | Admitting: General Practice

## 2020-01-02 MED FILL — AMOXICILLIN 500 MG CAPSULE: 500 | 2 days supply | Qty: 8 | Fill #0

## 2020-01-03 LAB — CUP PACEART REMOTE DEVICE CHECK
Battery Remaining Longevity: 118 mo
Battery Voltage: 3.01 V
Brady Statistic AP VP Percent: 84.54 %
Brady Statistic AP VS Percent: 0.32 %
Brady Statistic AS VP Percent: 11.81 %
Brady Statistic AS VS Percent: 3.33 %
Brady Statistic RA Percent Paced: 86.67 %
Brady Statistic RV Percent Paced: 96.36 %
Date Time Interrogation Session: 20210701212829
Implantable Lead Implant Date: 20191230
Implantable Lead Implant Date: 20191230
Implantable Lead Location: 753859
Implantable Lead Location: 753860
Implantable Lead Model: 5076
Implantable Lead Model: 5076
Implantable Pulse Generator Implant Date: 20191230
Lead Channel Impedance Value: 304 Ohm
Lead Channel Impedance Value: 342 Ohm
Lead Channel Impedance Value: 399 Ohm
Lead Channel Impedance Value: 627 Ohm
Lead Channel Pacing Threshold Amplitude: 0.625 V
Lead Channel Pacing Threshold Amplitude: 1.125 V
Lead Channel Pacing Threshold Pulse Width: 0.4 ms
Lead Channel Pacing Threshold Pulse Width: 0.4 ms
Lead Channel Sensing Intrinsic Amplitude: 2.5 mV
Lead Channel Sensing Intrinsic Amplitude: 2.5 mV
Lead Channel Sensing Intrinsic Amplitude: 20.375 mV
Lead Channel Sensing Intrinsic Amplitude: 20.375 mV
Lead Channel Setting Pacing Amplitude: 1.5 V
Lead Channel Setting Pacing Amplitude: 2.5 V
Lead Channel Setting Pacing Pulse Width: 0.4 ms
Lead Channel Setting Sensing Sensitivity: 1.2 mV

## 2020-01-03 NOTE — Progress Notes (Signed)
Remote pacemaker transmission.   

## 2020-01-15 DIAGNOSIS — R42 Dizziness and giddiness: Secondary | ICD-10-CM | POA: Diagnosis not present

## 2020-01-15 DIAGNOSIS — Z982 Presence of cerebrospinal fluid drainage device: Secondary | ICD-10-CM | POA: Diagnosis not present

## 2020-01-22 DIAGNOSIS — L304 Erythema intertrigo: Secondary | ICD-10-CM | POA: Diagnosis not present

## 2020-01-22 DIAGNOSIS — L821 Other seborrheic keratosis: Secondary | ICD-10-CM | POA: Diagnosis not present

## 2020-01-22 DIAGNOSIS — L570 Actinic keratosis: Secondary | ICD-10-CM | POA: Diagnosis not present

## 2020-01-22 DIAGNOSIS — L820 Inflamed seborrheic keratosis: Secondary | ICD-10-CM | POA: Diagnosis not present

## 2020-01-22 DIAGNOSIS — L578 Other skin changes due to chronic exposure to nonionizing radiation: Secondary | ICD-10-CM | POA: Diagnosis not present

## 2020-02-03 ENCOUNTER — Telehealth: Payer: Self-pay | Admitting: *Deleted

## 2020-02-03 NOTE — Telephone Encounter (Signed)
Dr. Hilarie Fredrickson,  You saw this pt in the office 08-21-19/  Since his OV, he has had a seizure (in April) and his Keppra was increased. He also was complaining of increased dizziness- his head CT on 01-15-20 showed no changes in his VP shunt.   I know he does have a complicated hx, so just wanted to make sure you were aware of his seizure since you saw him.  Is he ok to proceed?  Thanks, [employer]

## 2020-02-03 NOTE — Telephone Encounter (Signed)
noted 

## 2020-02-03 NOTE — Telephone Encounter (Signed)
Given his seizure was 4 months ago and his medications have been adjusted, okay to proceed as scheduled assuming no further seizure activity prior to colonoscopy

## 2020-02-12 ENCOUNTER — Ambulatory Visit (AMBULATORY_SURGERY_CENTER): Payer: Self-pay | Admitting: *Deleted

## 2020-02-12 ENCOUNTER — Other Ambulatory Visit: Payer: Self-pay

## 2020-02-12 VITALS — Ht 68.0 in | Wt 261.0 lb

## 2020-02-12 DIAGNOSIS — Z8719 Personal history of other diseases of the digestive system: Secondary | ICD-10-CM

## 2020-02-12 DIAGNOSIS — R42 Dizziness and giddiness: Secondary | ICD-10-CM | POA: Diagnosis not present

## 2020-02-12 DIAGNOSIS — Z8679 Personal history of other diseases of the circulatory system: Secondary | ICD-10-CM | POA: Diagnosis not present

## 2020-02-12 DIAGNOSIS — K219 Gastro-esophageal reflux disease without esophagitis: Secondary | ICD-10-CM

## 2020-02-12 DIAGNOSIS — R5383 Other fatigue: Secondary | ICD-10-CM | POA: Diagnosis not present

## 2020-02-12 DIAGNOSIS — Z8669 Personal history of other diseases of the nervous system and sense organs: Secondary | ICD-10-CM | POA: Diagnosis not present

## 2020-02-12 DIAGNOSIS — Z982 Presence of cerebrospinal fluid drainage device: Secondary | ICD-10-CM | POA: Diagnosis not present

## 2020-02-12 MED ORDER — SUTAB 1479-225-188 MG PO TABS: 1.0000 | ORAL_TABLET | ORAL | 0 refills | Status: DC

## 2020-02-12 MED FILL — SUTAB 1479-225-188 MG TABS: 1479-225-18 | 1 days supply | Qty: 24 | Fill #0

## 2020-02-12 NOTE — Progress Notes (Signed)
Patient and wife is here in-person for PV. Patient denies any allergies to eggs or soy. Patient denies any problems with anesthesia/sedation. Patient denies any oxygen use at home. Patient denies taking any diet/weight loss medications or blood thinners. Patient is not being treated for MRSA or C-diff. Patient is aware of our care-partner policy and JQGBE-[id number] safety protocol. COVID-19 vaccines completed.  Prep Prescription coupon given to the patient.

## 2020-02-21 ENCOUNTER — Encounter: Payer: Self-pay | Admitting: *Deleted

## 2020-02-21 DIAGNOSIS — R569 Unspecified convulsions: Secondary | ICD-10-CM | POA: Insufficient documentation

## 2020-02-24 ENCOUNTER — Other Ambulatory Visit: Payer: Self-pay | Admitting: Cardiology

## 2020-02-24 DIAGNOSIS — I5032 Chronic diastolic (congestive) heart failure: Secondary | ICD-10-CM

## 2020-02-24 DIAGNOSIS — I25118 Atherosclerotic heart disease of native coronary artery with other forms of angina pectoris: Secondary | ICD-10-CM

## 2020-02-25 ENCOUNTER — Ambulatory Visit (AMBULATORY_SURGERY_CENTER): Payer: Medicare Other | Admitting: Internal Medicine

## 2020-02-25 ENCOUNTER — Encounter: Payer: Self-pay | Admitting: Internal Medicine

## 2020-02-25 ENCOUNTER — Other Ambulatory Visit: Payer: Self-pay

## 2020-02-25 ENCOUNTER — Other Ambulatory Visit: Payer: Self-pay | Admitting: Cardiology

## 2020-02-25 VITALS — BP 138/81 | HR 63 | Temp 97.3°F | Resp 14 | Ht 68.0 in | Wt 261.0 lb

## 2020-02-25 DIAGNOSIS — D123 Benign neoplasm of transverse colon: Secondary | ICD-10-CM | POA: Diagnosis not present

## 2020-02-25 DIAGNOSIS — K317 Polyp of stomach and duodenum: Secondary | ICD-10-CM | POA: Diagnosis not present

## 2020-02-25 DIAGNOSIS — Z86010 Personal history of colonic polyps: Secondary | ICD-10-CM | POA: Diagnosis not present

## 2020-02-25 DIAGNOSIS — K319 Disease of stomach and duodenum, unspecified: Secondary | ICD-10-CM

## 2020-02-25 DIAGNOSIS — Z8719 Personal history of other diseases of the digestive system: Secondary | ICD-10-CM

## 2020-02-25 DIAGNOSIS — K219 Gastro-esophageal reflux disease without esophagitis: Secondary | ICD-10-CM | POA: Diagnosis not present

## 2020-02-25 DIAGNOSIS — K2970 Gastritis, unspecified, without bleeding: Secondary | ICD-10-CM | POA: Diagnosis not present

## 2020-02-25 DIAGNOSIS — K3189 Other diseases of stomach and duodenum: Secondary | ICD-10-CM | POA: Diagnosis not present

## 2020-02-25 DIAGNOSIS — K5090 Crohn's disease, unspecified, without complications: Secondary | ICD-10-CM

## 2020-02-25 MED ORDER — SODIUM CHLORIDE 0.9 % IV SOLN: 500.0000 mL | Freq: Once | INTRAVENOUS | Status: DC

## 2020-02-25 MED FILL — FUROSEMIDE 40 MG TAB: 40 | 90 days supply | Qty: 90 | Fill #0

## 2020-02-25 NOTE — Progress Notes (Signed)
Called to room to assist during endoscopic procedure.  Patient ID and intended procedure confirmed with present staff. Received instructions for my participation in the procedure from the performing physician.  

## 2020-02-25 NOTE — Op Note (Signed)
[facility] Patient Name: Jeff Weaver Procedure Date: 02/25/2020 9:30 AM MRN: [id number] Endoscopist: Jerene Bears , MD Age: 74 Referring MD:  Date of Birth: May 05, 1946 Gender: Male Account #: [id number] Procedure:                Upper GI endoscopy Indications:              Gastro-esophageal reflux disease Medicines:                Monitored Anesthesia Care Procedure:                Pre-Anesthesia Assessment:                           - Prior to the procedure, a History and Physical                            was performed, and patient medications and                            allergies were reviewed. The patient's tolerance of                            previous anesthesia was also reviewed. The risks                            and benefits of the procedure and the sedation                            options and risks were discussed with the patient.                            All questions were answered, and informed consent                            was obtained. Prior Anticoagulants: The patient has                            taken no previous anticoagulant or antiplatelet                            agents. ASA Grade Assessment: III - A patient with                            severe systemic disease. After reviewing the risks                            and benefits, the patient was deemed in                            satisfactory condition to undergo the procedure.                           After obtaining informed consent, the endoscope was  passed under direct vision. Throughout the                            procedure, the patient's blood pressure, pulse, and                            oxygen saturations were monitored continuously. The                            Endoscope was introduced through the mouth, and                            advanced to the second part of duodenum. The upper                            GI endoscopy was  accomplished without difficulty.                            The patient tolerated the procedure well. Scope In: Scope Out: Findings:                 The examined esophagus was normal.                           Two 6 to 8 mm semi-sessile polyps were found in the                            distal gastric body. The smaller of the 2 polyps                            had an shallow ulceration at the tip. The polyps                            were removed with a hot snare. Resection and                            retrieval were complete.                           Moderate inflammation characterized by erosions and                            erythema was found in the gastric antrum. Biopsies                            were taken with a cold forceps for histology and                            Helicobacter pylori testing.                           The examined duodenum was normal. Complications:            No immediate complications. Estimated Blood Loss:     Estimated blood loss was  minimal. Impression:               - Normal esophagus.                           - Two gastric polyps. Resected and retrieved.                           - Gastritis. Biopsied.                           - Normal examined duodenum. Recommendation:           - Patient has a contact number available for                            emergencies. The signs and symptoms of potential                            delayed complications were discussed with the                            patient. Return to normal activities tomorrow.                            Written discharge instructions were provided to the                            patient.                           - Resume previous diet.                           - Continue present medications. Avoid NSAIDs for at                            least 2 weeks after polypectomy. Okay for low dose                            aspirin therapy.                           - Await  pathology results. Jerene Bears, MD 02/25/2020 10:18:11 AM This report has been signed electronically.

## 2020-02-25 NOTE — Op Note (Signed)
[facility] Patient Name: Jeff Weaver Procedure Date: 02/25/2020 9:30 AM MRN: [id number] Endoscopist: Jerene Bears , MD Age: 74 Referring MD:  Date of Birth: 23-Jul-1945 Gender: Male Account #: [id number] Procedure:                Colonoscopy Indications:              High risk colon cancer surveillance: history of                            remote Crohn's colitis inactive for many years (no                            evidence of IBD by colon biopsy in 2009, 2014) Medicines:                Monitored Anesthesia Care Procedure:                Pre-Anesthesia Assessment:                           - Prior to the procedure, a History and Physical                            was performed, and patient medications and                            allergies were reviewed. The patient's tolerance of                            previous anesthesia was also reviewed. The risks                            and benefits of the procedure and the sedation                            options and risks were discussed with the patient.                            All questions were answered, and informed consent                            was obtained. Prior Anticoagulants: The patient has                            taken no previous anticoagulant or antiplatelet                            agents. ASA Grade Assessment: III - A patient with                            severe systemic disease. After reviewing the risks                            and benefits, the patient was deemed in  satisfactory condition to undergo the procedure.                           After obtaining informed consent, the colonoscope                            was passed under direct vision. Throughout the                            procedure, the patient's blood pressure, pulse, and                            oxygen saturations were monitored continuously. The                            Colonoscope was  introduced through the anus and                            advanced to the cecum, identified by appendiceal                            orifice and ileocecal valve. The colonoscopy was                            performed without difficulty. The patient tolerated                            the procedure well. The quality of the bowel                            preparation was excellent. The ileocecal valve,                            appendiceal orifice, and rectum were photographed. Scope In: 9:52:44 AM Scope Out: 10:12:54 AM Scope Withdrawal Time: 0 hours 16 minutes 32 seconds  Total Procedure Duration: 0 hours 20 minutes 10 seconds  Findings:                 The digital rectal exam was normal.                           Four sessile polyps were found in the transverse                            colon. The polyps were 3 to 6 mm in size. These                            polyps were removed with a cold snare. Resection                            and retrieval were complete.                           Multiple small and large-mouthed diverticula were  found in the sigmoid colon and descending colon.                           Internal hemorrhoids were found during retroflexion.                           Normal mucosa was found in the entire colon. No                            evidence of Crohn's disease throughout the colon. Complications:            No immediate complications. Estimated Blood Loss:     Estimated blood loss was minimal. Impression:               - Four 3 to 6 mm polyps in the transverse colon,                            removed with a cold snare. Resected and retrieved.                           - Diverticulosis in the sigmoid colon and in the                            descending colon.                           - Internal hemorrhoids.                           - No evidence of Crohn's colitis. Recommendation:           - Patient has a contact  number available for                            emergencies. The signs and symptoms of potential                            delayed complications were discussed with the                            patient. Return to normal activities tomorrow.                            Written discharge instructions were provided to the                            patient.                           - Resume previous diet.                           - Continue present medications.                           - Await pathology results.                           -  No recommendation at this time regarding repeat                            colonoscopy due to age. Jerene Bears, MD 02/25/2020 10:28:25 AM This report has been signed electronically.

## 2020-02-25 NOTE — Progress Notes (Signed)
PT taken to PACU. Monitors in place. VSS. Report given to RN. 

## 2020-02-25 NOTE — Progress Notes (Signed)
Pt's states no medical or surgical changes since previsit or office visit.  VS CW  Wife present to assist with answering questions Jeff Weaver)  Last seizure in April see TE 02/03/20  Shunt on left side of head since 2013

## 2020-02-25 NOTE — Patient Instructions (Signed)
Handouts on polyps, diverticulosis, and gastritis given to you today  Await pathology results  Avoid NSAIDS for 2 weeks. Tylenol is okay to take. Low dose aspirin is okay to take    YOU HAD AN ENDOSCOPIC PROCEDURE TODAY AT [facility]:   Refer to the procedure report that was given to you for any specific questions about what was found during the examination.  If the procedure report does not answer your questions, please call your gastroenterologist to clarify.  If you requested that your care partner not be given the details of your procedure findings, then the procedure report has been included in a sealed envelope for you to review at your convenience later.  YOU SHOULD EXPECT: Some feelings of bloating in the abdomen. Passage of more gas than usual.  Walking can help get rid of the air that was put into your GI tract during the procedure and reduce the bloating. If you had a lower endoscopy (such as a colonoscopy or flexible sigmoidoscopy) you may notice spotting of blood in your stool or on the toilet paper. If you underwent a bowel prep for your procedure, you may not have a normal bowel movement for a few days.  Please Note:  You might notice some irritation and congestion in your nose or some drainage.  This is from the oxygen used during your procedure.  There is no need for concern and it should clear up in a day or so.  SYMPTOMS TO REPORT IMMEDIATELY:   Following lower endoscopy (colonoscopy or flexible sigmoidoscopy):  Excessive amounts of blood in the stool  Significant tenderness or worsening of abdominal pains  Swelling of the abdomen that is new, acute  Fever of 100F or higher   Following upper endoscopy (EGD)  Vomiting of blood or coffee ground material  New chest pain or pain under the shoulder blades  Painful or persistently difficult swallowing  New shortness of breath  Fever of 100F or higher  Black, tarry-looking stools  For urgent or emergent  issues, a gastroenterologist can be reached at any hour by calling [phone]. Do not use MyChart messaging for urgent concerns.    DIET:  We do recommend a small meal at first, but then you may proceed to your regular diet.  Drink plenty of fluids but you should avoid alcoholic beverages for 24 hours.  ACTIVITY:  You should plan to take it easy for the rest of today and you should NOT DRIVE or use heavy machinery until tomorrow (because of the sedation medicines used during the test).    FOLLOW UP: Our staff will call the number listed on your records 48-72 hours following your procedure to check on you and address any questions or concerns that you may have regarding the information given to you following your procedure. If we do not reach you, we will leave a message.  We will attempt to reach you two times.  During this call, we will ask if you have developed any symptoms of COVID 19. If you develop any symptoms (ie: fever, flu-like symptoms, shortness of breath, cough etc.) before then, please call [phone].  If you test positive for Covid 19 in the 2 weeks post procedure, please call and report this information to [place].    If any biopsies were taken you will be contacted by phone or by letter within the next 1-3 weeks.  Please call us at [phone] if you have not heard about the biopsies in 3  weeks.    SIGNATURES/CONFIDENTIALITY: You and/or your care partner have signed paperwork which will be entered into your electronic medical record.  These signatures attest to the fact that that the information above on your After Visit Summary has been reviewed and is understood.  Full responsibility of the confidentiality of this discharge information lies with you and/or your care-partner.

## 2020-02-26 DIAGNOSIS — R2681 Unsteadiness on feet: Secondary | ICD-10-CM | POA: Diagnosis not present

## 2020-02-26 DIAGNOSIS — Z4541 Encounter for adjustment and management of cerebrospinal fluid drainage device: Secondary | ICD-10-CM | POA: Diagnosis not present

## 2020-02-26 DIAGNOSIS — R519 Headache, unspecified: Secondary | ICD-10-CM | POA: Diagnosis not present

## 2020-02-26 DIAGNOSIS — R42 Dizziness and giddiness: Secondary | ICD-10-CM | POA: Diagnosis not present

## 2020-02-27 ENCOUNTER — Telehealth: Payer: Self-pay | Admitting: *Deleted

## 2020-02-27 NOTE — Telephone Encounter (Signed)
  Follow up Call-  Call back number 02/25/2020  Post procedure Call Back phone  # [phone]  Permission to leave phone message Yes  Some recent data might be hidden     Patient questions:  Do you have a fever, pain , or abdominal swelling? No. Pain Score  0 *  Have you tolerated food without any problems? Yes.    Have you been able to return to your normal activities? Yes.    Do you have any questions about your discharge instructions: Diet   No. Medications  No. Follow up visit  No.  Do you have questions or concerns about your Care? No.  Actions: * If pain score is 4 or above: No action needed, pain <4.   1. Have you developed a fever since your procedure? no  2.   Have you had an respiratory symptoms (SOB or cough) since your procedure? no  3.   Have you tested positive for COVID 19 since your procedure no  4.   Have you had any family members/close contacts diagnosed with the COVID 19 since your procedure?  no   If yes to any of these questions please route to Joylene John, RN and Joella Prince, RN

## 2020-02-28 ENCOUNTER — Telehealth: Payer: Self-pay | Admitting: Emergency Medicine

## 2020-02-28 MED ORDER — MIRABEGRON ER 50 MG PO TB24
50.0000 mg | ORAL_TABLET | Freq: Every day | ORAL | 1 refills | Status: DC
Start: 2020-02-28 — End: 2023-04-12

## 2020-02-28 MED FILL — MYRBETRIQ ER 50 MG TABLET: 50 | 30 days supply | Qty: 30 | Fill #0

## 2020-02-28 NOTE — Telephone Encounter (Signed)
rx sent to pharmacy

## 2020-02-28 NOTE — Addendum Note (Signed)
Addended by: [name] on: 02/28/2020 04:25 PM   Modules accepted: Orders

## 2020-02-28 NOTE — Telephone Encounter (Signed)
Patients spouse called and stated patients Urologist usually prescribes patients mirabegron ER (MYRBETRIQ). The provider that usually refills it has currently left the office and she is wondering if Dr Quay Burow would refill medication for patient. If so the pharmacy is [facility] in [place]. If not she is wondering if Dr Quay Burow can advise her on how to get this refilled. Please give patients spouse [name] a call back thanks.

## 2020-03-02 ENCOUNTER — Encounter: Payer: Self-pay | Admitting: Internal Medicine

## 2020-03-12 MED FILL — PREVIDENT 5000 BOOSTER PLUS: 1.1 | 30 days supply | Qty: 100 | Fill #0

## 2020-03-30 DIAGNOSIS — Z8679 Personal history of other diseases of the circulatory system: Secondary | ICD-10-CM | POA: Diagnosis not present

## 2020-03-30 DIAGNOSIS — Q2739 Arteriovenous malformation, other site: Secondary | ICD-10-CM | POA: Diagnosis not present

## 2020-03-30 DIAGNOSIS — Z982 Presence of cerebrospinal fluid drainage device: Secondary | ICD-10-CM | POA: Diagnosis not present

## 2020-03-30 DIAGNOSIS — R42 Dizziness and giddiness: Secondary | ICD-10-CM | POA: Diagnosis not present

## 2020-03-30 DIAGNOSIS — R41841 Cognitive communication deficit: Secondary | ICD-10-CM | POA: Diagnosis not present

## 2020-03-30 DIAGNOSIS — G40209 Localization-related (focal) (partial) symptomatic epilepsy and epileptic syndromes with complex partial seizures, not intractable, without status epilepticus: Secondary | ICD-10-CM | POA: Diagnosis not present

## 2020-03-30 DIAGNOSIS — G40109 Localization-related (focal) (partial) symptomatic epilepsy and epileptic syndromes with simple partial seizures, not intractable, without status epilepticus: Secondary | ICD-10-CM | POA: Diagnosis not present

## 2020-03-30 DIAGNOSIS — Z9889 Other specified postprocedural states: Secondary | ICD-10-CM | POA: Diagnosis not present

## 2020-03-30 DIAGNOSIS — R413 Other amnesia: Secondary | ICD-10-CM | POA: Diagnosis not present

## 2020-03-30 MED FILL — ESCITALOPRAM 10 MG TABLET: 10 | 30 days supply | Qty: 30 | Fill #0

## 2020-03-31 MED FILL — MYRBETRIQ ER 50 MG TABLET: 50 | 30 days supply | Qty: 30 | Fill #0

## 2020-04-01 ENCOUNTER — Ambulatory Visit (INDEPENDENT_AMBULATORY_CARE_PROVIDER_SITE_OTHER): Payer: Medicare Other | Admitting: Emergency Medicine

## 2020-04-01 DIAGNOSIS — I495 Sick sinus syndrome: Secondary | ICD-10-CM

## 2020-04-02 LAB — CUP PACEART REMOTE DEVICE CHECK
Battery Remaining Longevity: 114 mo
Battery Voltage: 3 V
Brady Statistic AP VP Percent: 84.25 %
Brady Statistic AP VS Percent: 0.31 %
Brady Statistic AS VP Percent: 11.88 %
Brady Statistic AS VS Percent: 3.56 %
Brady Statistic RA Percent Paced: 86.05 %
Brady Statistic RV Percent Paced: 96.13 %
Date Time Interrogation Session: 20210928200012
Implantable Lead Implant Date: 20191230
Implantable Lead Implant Date: 20191230
Implantable Lead Location: 753859
Implantable Lead Location: 753860
Implantable Lead Model: 5076
Implantable Lead Model: 5076
Implantable Pulse Generator Implant Date: 20191230
Lead Channel Impedance Value: 304 Ohm
Lead Channel Impedance Value: 342 Ohm
Lead Channel Impedance Value: 399 Ohm
Lead Channel Impedance Value: 627 Ohm
Lead Channel Pacing Threshold Amplitude: 0.625 V
Lead Channel Pacing Threshold Amplitude: 1.25 V
Lead Channel Pacing Threshold Pulse Width: 0.4 ms
Lead Channel Pacing Threshold Pulse Width: 0.4 ms
Lead Channel Sensing Intrinsic Amplitude: 2.5 mV
Lead Channel Sensing Intrinsic Amplitude: 2.5 mV
Lead Channel Sensing Intrinsic Amplitude: 20.375 mV
Lead Channel Sensing Intrinsic Amplitude: 20.375 mV
Lead Channel Setting Pacing Amplitude: 1.5 V
Lead Channel Setting Pacing Amplitude: 2.5 V
Lead Channel Setting Pacing Pulse Width: 0.4 ms
Lead Channel Setting Sensing Sensitivity: 1.2 mV

## 2020-04-03 NOTE — Progress Notes (Signed)
Remote pacemaker transmission.   

## 2020-04-13 MED FILL — ROSUVASTATIN CALCIUM 5 MG T: 5 | 90 days supply | Qty: 45 | Fill #2

## 2020-04-15 DIAGNOSIS — N401 Enlarged prostate with lower urinary tract symptoms: Secondary | ICD-10-CM | POA: Diagnosis not present

## 2020-04-15 DIAGNOSIS — R972 Elevated prostate specific antigen [PSA]: Secondary | ICD-10-CM | POA: Diagnosis not present

## 2020-04-15 DIAGNOSIS — N3941 Urge incontinence: Secondary | ICD-10-CM | POA: Diagnosis not present

## 2020-04-15 DIAGNOSIS — R3915 Urgency of urination: Secondary | ICD-10-CM | POA: Diagnosis not present

## 2020-04-15 DIAGNOSIS — R351 Nocturia: Secondary | ICD-10-CM | POA: Diagnosis not present

## 2020-04-15 DIAGNOSIS — N3281 Overactive bladder: Secondary | ICD-10-CM | POA: Diagnosis not present

## 2020-04-15 DIAGNOSIS — R339 Retention of urine, unspecified: Secondary | ICD-10-CM | POA: Diagnosis not present

## 2020-04-17 MED FILL — OMEPRAZOLE 40 MG CPDR: 40 | 90 days supply | Qty: 90 | Fill #1

## 2020-04-23 ENCOUNTER — Other Ambulatory Visit: Payer: Self-pay

## 2020-04-23 ENCOUNTER — Ambulatory Visit (INDEPENDENT_AMBULATORY_CARE_PROVIDER_SITE_OTHER): Payer: Medicare Other

## 2020-04-23 DIAGNOSIS — Z23 Encounter for immunization: Secondary | ICD-10-CM | POA: Diagnosis not present

## 2020-04-28 DIAGNOSIS — M79674 Pain in right toe(s): Secondary | ICD-10-CM | POA: Diagnosis not present

## 2020-04-28 DIAGNOSIS — L603 Nail dystrophy: Secondary | ICD-10-CM | POA: Diagnosis not present

## 2020-04-28 DIAGNOSIS — B351 Tinea unguium: Secondary | ICD-10-CM | POA: Diagnosis not present

## 2020-04-28 DIAGNOSIS — M79675 Pain in left toe(s): Secondary | ICD-10-CM | POA: Diagnosis not present

## 2020-04-30 MED FILL — AMOXICILLIN 500 MG CAPSULE: 500 | 2 days supply | Qty: 8 | Fill #1

## 2020-05-05 ENCOUNTER — Other Ambulatory Visit: Payer: Self-pay | Admitting: Internal Medicine

## 2020-05-05 DIAGNOSIS — R42 Dizziness and giddiness: Secondary | ICD-10-CM

## 2020-05-05 DIAGNOSIS — R5381 Other malaise: Secondary | ICD-10-CM

## 2020-05-05 DIAGNOSIS — R2681 Unsteadiness on feet: Secondary | ICD-10-CM

## 2020-05-07 ENCOUNTER — Telehealth: Payer: Self-pay | Admitting: Internal Medicine

## 2020-05-07 DIAGNOSIS — F3289 Other specified depressive episodes: Secondary | ICD-10-CM

## 2020-05-07 DIAGNOSIS — F4310 Post-traumatic stress disorder, unspecified: Secondary | ICD-10-CM

## 2020-05-07 NOTE — Telephone Encounter (Signed)
ordered

## 2020-05-07 NOTE — Telephone Encounter (Signed)
Patients wife calling stating Dr. Quay Burow recommended them to go to Dr. Casimiro Needle and she called their office and they need a referral from our office.

## 2020-05-08 ENCOUNTER — Ambulatory Visit: Payer: Medicare Other | Attending: Internal Medicine

## 2020-05-08 DIAGNOSIS — Z23 Encounter for immunization: Secondary | ICD-10-CM

## 2020-05-11 DIAGNOSIS — Z982 Presence of cerebrospinal fluid drainage device: Secondary | ICD-10-CM | POA: Diagnosis not present

## 2020-05-11 DIAGNOSIS — H832X2 Labyrinthine dysfunction, left ear: Secondary | ICD-10-CM | POA: Diagnosis not present

## 2020-05-11 DIAGNOSIS — H8111 Benign paroxysmal vertigo, right ear: Secondary | ICD-10-CM | POA: Diagnosis not present

## 2020-05-11 DIAGNOSIS — Z8679 Personal history of other diseases of the circulatory system: Secondary | ICD-10-CM | POA: Diagnosis not present

## 2020-05-14 NOTE — Progress Notes (Signed)
   Covid-19 Vaccination Clinic  Name:  Jeff Weaver    MRN: [id number] DOB: Nov 01, 1945  05/14/2020  [name] was observed post Covid-19 immunization for 15 minutes without incident. He was provided with Vaccine Information Sheet and instruction to access the V-Safe system.   [name] was instructed to call 911 with any severe reactions post vaccine: Marland Kitchen Difficulty breathing  . Swelling of face and throat  . A fast heartbeat  . A bad rash all over body  . Dizziness and weakness

## 2020-05-15 DIAGNOSIS — R42 Dizziness and giddiness: Secondary | ICD-10-CM | POA: Diagnosis not present

## 2020-05-15 DIAGNOSIS — R269 Unspecified abnormalities of gait and mobility: Secondary | ICD-10-CM | POA: Diagnosis not present

## 2020-05-15 DIAGNOSIS — Z7409 Other reduced mobility: Secondary | ICD-10-CM | POA: Diagnosis not present

## 2020-05-15 DIAGNOSIS — R2681 Unsteadiness on feet: Secondary | ICD-10-CM | POA: Diagnosis not present

## 2020-05-15 DIAGNOSIS — R5381 Other malaise: Secondary | ICD-10-CM | POA: Diagnosis not present

## 2020-05-25 DIAGNOSIS — H8111 Benign paroxysmal vertigo, right ear: Secondary | ICD-10-CM | POA: Diagnosis not present

## 2020-05-25 MED FILL — FUROSEMIDE 40 MG TAB: 40 | 90 days supply | Qty: 90 | Fill #1

## 2020-06-01 ENCOUNTER — Other Ambulatory Visit: Payer: Self-pay

## 2020-06-01 ENCOUNTER — Ambulatory Visit (INDEPENDENT_AMBULATORY_CARE_PROVIDER_SITE_OTHER): Payer: Medicare Other | Admitting: Cardiology

## 2020-06-01 ENCOUNTER — Encounter: Payer: Self-pay | Admitting: Cardiology

## 2020-06-01 VITALS — BP 130/78 | HR 73 | Ht 68.0 in | Wt 255.8 lb

## 2020-06-01 DIAGNOSIS — I495 Sick sinus syndrome: Secondary | ICD-10-CM

## 2020-06-01 DIAGNOSIS — Z950 Presence of cardiac pacemaker: Secondary | ICD-10-CM | POA: Diagnosis not present

## 2020-06-01 DIAGNOSIS — I25118 Atherosclerotic heart disease of native coronary artery with other forms of angina pectoris: Secondary | ICD-10-CM

## 2020-06-01 NOTE — Progress Notes (Signed)
Cardiology Office Note    Date:  06/01/2020   ID:  Jeff Weaver, DOB 1946-01-07, MRN [id number]  PCP:  [name], MD  Cardiologist:  Ena Dawley, MD  Electrophysiologist:  Constance Haw, MD   Chief Complaint: 6 months follow-up  History of Present Illness:   Jeff Weaver is a 74 y.o. 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  He underwent nuclear stress test in 2014 for chest pain which was normal. He was admitted 11/2013 for chest pain and elevated troponin (0.74) -> LHC 11/26/13 revealed angiographically normal coronary arteries, mild mid LAD myocardial bridging, moderate to severely elevated LVEDP with systemic hypertension and preserved LVEF. Last echo 2D echo in 01/2016 showed moderate LVH, EF 60-65%, grade 2 DD, no RWMA, mild AI. He was seen in the office 01/2018 with worsening dyspnea on exertion. PFTs showed minimal Obstructive Airways Disease, no significant COPD. Cardiac cath 01/12/18 showed minimal nonobstructive CAD, normal LVEF, normal LVEDP. He had 2nd degree AVB type 1 during cath. He wore a monitor 02/2018 which showed profound bradycardia, AVB with frequently nonconducted P waves, frequent PVCs (7%) and short runs of NSVT. He underwent Medtronic Azure XT DR MRI SureScan dual-chamber pacemaker and has done well from a cardiac standpoint.  The patient is coming after 6 months, he is basically inactive and rarely leaves the house, he gets short of breath with activity and has also balance problems as well as arthritis in both knees that limits him from walking.  He has not had any falls yet, he is seeing an ENT  specialist for balance problems.  He denies any lower extremity edema, he has been watching his diet and has in fact lost 6 pounds, denies any exertional chest pain, no orthopnea or proximal nocturnal dyspnea.  He has been having problems downloading data to his phone from his pacemaker.  He has been experiencing right lower quadrant postprandial pain during the Thanksgiving holidays.  Currently without pain.   Past Medical History:  Diagnosis Date  . Aortic insufficiency    a. mild 2017 echo.  . Cerebral aneurysm    a. s/p clipping at [facility] in 8/13 c/b short-term memory loss, cerebral hemorrhage and seizure disorder s/p VP shunt.  . Cerebral hemorrhage ([place])   . Chronic diastolic CHF (congestive heart failure) ([place])   . CVA (cerebral infarction)   . Diverticulosis   . Enteritis (regional)    Dr Olevia Perches  . First degree AV block   . GI bleed   . HTN (hypertension)   . Hyperlipidemia   . Hypertensive heart disease   . IBS (irritable bowel syndrome)   . Mild CAD    Cardiac cath 01/12/18 showed minimal nonobstructive CAD, normal LVEF, normal LVEDP.  Marland Kitchen NSVT (nonsustained ventricular tachycardia) ([place])   . Orthostatic hypotension   . Pre-diabetes   . PVC's (premature ventricular contractions)   . 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 Rheumatoid arthritis(714.0)    dxed in [employer] in 1980s  . Second degree AV block, Mobitz type I   . Seizures ([place])    last April 26,2021  . Sinus bradycardia   .  Sleep apnea   . SSS (sick sinus syndrome) ([place])    a. s/p PPM 07/02/18.    Past Surgical History:  Procedure Laterality Date  . CHOLECYSTECTOMY N/A 01/07/2016   Procedure: LAPAROSCOPIC CHOLECYSTECTOMY ;  Surgeon: Coralie Keens, MD;  Location: [facility];  Service: General;  Laterality: N/A;  . cns shunt  02/23/12  . COLONOSCOPY W/ POLYPECTOMY  1978   negative 2009, Dr Olevia Perches. Due 2014  . CRANIOTOMY  02/02/12   Dr Harvel Ricks, [place] of aneurysm  . CRANIOTOMY  02-02-12   left  pterional craniotomy for clipping complex anterior communicating artery aneurysm   . HERNIA REPAIR    . LEFT HEART CATH AND CORONARY ANGIOGRAPHY N/A 01/12/2018   Procedure: LEFT HEART CATH AND CORONARY ANGIOGRAPHY;  Surgeon: Martinique, Peter M, MD;  Location: [facility] CV LAB;  Service: Cardiovascular;  Laterality: N/A;  . LEFT HEART CATHETERIZATION WITH CORONARY ANGIOGRAM N/A 11/26/2013   Procedure: LEFT HEART CATHETERIZATION WITH CORONARY ANGIOGRAM;  Surgeon: [name], MD;  Location: [facility] CATH LAB;  Service: Cardiovascular;  Laterality: N/A;  . NOSE SURGERY    . PACEMAKER IMPLANT N/A 07/02/2018   Procedure: PACEMAKER IMPLANT;  Surgeon: Constance Haw, MD;  Location: [facility] CV LAB;  Service: Cardiovascular;  Laterality: N/A;  . SHOULDER SURGERY  1997  . TONSILLECTOMY AND ADENOIDECTOMY    . VENTRICULOPERITONEAL SHUNT  02-23-12   INSERTION OF RIGHT FRONTAL VENTRICULOPERITONEAL SHUNT WITH A CODMAN HAKIM PROGRAMMABLE VALVE    Current Medications: Current Meds  Medication Sig  . acetaminophen (TYLENOL) 325 MG tablet Take 325-650 mg by mouth every 6 (six) hours as needed for headache.  Marland Kitchen amoxicillin (AMOXIL) 500 MG capsule Take 2,000 mg by mouth as directed. Take 2000 mg by mouth 1 hour prior to dental appointments   . aspirin EC 81 MG tablet Take 1 tablet (81 mg total) by mouth daily.  . blood glucose meter kit and supplies KIT Dispense based on patient and insurance preference. Use up to four times daily as directed. (prediabetes hypoglycemia).  . cetirizine (ZYRTEC) 10 MG tablet Take 1 tablet (10 mg total) by mouth daily.  . Cholecalciferol (VITAMIN D) 50 MCG (2000 UT) CAPS Take 1 capsule by mouth daily.  . clonazePAM (KLONOPIN) 1 MG disintegrating tablet ALLOW 1 TABLET TO DISSOLVE IN CHEEK FOR SEIZURES LASTING LONGER THAN 4 MINUTES. OR AFTER 2ND SEIZURE IN 24 HOURS  . fluticasone (FLONASE) 50 MCG/ACT nasal spray Place 1-2 sprays into both nostrils daily as needed.   . furosemide  (LASIX) 40 MG tablet TAKE 1 TABLET (40 MG TOTAL) BY MOUTH DAILY.  Marland Kitchen levETIRAcetam (KEPPRA) 500 MG tablet Take 2,500 mg by mouth 2 (two) times daily. 2 TABLETS BY MOUTH IN AM AND 3 TABS IN THE EVENING.  . LORazepam (ATIVAN) 1 MG tablet Take 1 mg by mouth every 6 (six) hours as needed for seizure.   . mirabegron ER (MYRBETRIQ) 50 MG TB24 tablet Take 1 tablet (50 mg total) by mouth daily.  . nitroGLYCERIN (NITROSTAT) 0.4 MG SL tablet Place 1 tablet (0.4 mg total) under the tongue every 5 (five) minutes as needed for chest pain.  . NON FORMULARY CPAP Machine: authorized by [facility] in [place]   . nystatin ointment (MYCOSTATIN) Apply 1 application topically 3 (three) times daily.  Marland Kitchen omeprazole (PRILOSEC) 40 MG capsule Take 1 capsule (40 mg total) by mouth daily.  Marland Kitchen pyridOXINE (VITAMIN B-6) 100 MG tablet Take 100 mg by mouth daily.  . rosuvastatin (CRESTOR) 5 MG  tablet TAKE 1 TABLET BY MOUTH 3 TIMES A WEEK AND INCREASE AS TOLERATED  . Tamsulosin HCl (FLOMAX) 0.4 MG CAPS Take 0.4 mg by mouth every evening.   . thiamine 100 MG tablet Take 100 mg by mouth daily.  Marland Kitchen VIMPAT 200 MG TABS tablet Take 200 mg by mouth 2 (two) times daily.  . vitamin B-12 (CYANOCOBALAMIN) 1000 MCG tablet Take 1,000 mcg by mouth daily.      Allergies:   Haloperidol, Rosuvastatin, Simvastatin, Lisinopril, Nifedipine, and Pravastatin   Social History   Socioeconomic History  . Marital status: Married    Spouse name: Not on file  . Number of children: 1  . Years of education: Not on file  . Highest education level: Not on file  Occupational History  . Occupation: retired/ Education officer, community  Tobacco Use  . Smoking status: Former Smoker    Packs/day: 0.00    Years: 30.00    Pack years: 0.00    Types: Pipe    Quit date: 07/04/2008    Years since quitting: 11.9  . Smokeless tobacco: Former Network engineer  . Vaping Use: Never used  Substance and Sexual Activity  . Alcohol use: Yes    Alcohol/week: 0.0 standard drinks    Comment:  Very Infrequently   . Drug use: No  . Sexual activity: Not Currently  Other Topics Concern  . Not on file  Social History Narrative  . Not on file   Social Determinants of Health   Financial Resource Strain:   . Difficulty of Paying Living Expenses: Not on file  Food Insecurity:   . Worried About Charity fundraiser in the Last Year: Not on file  . Ran Out of Food in the Last Year: Not on file  Transportation Needs:   . Lack of Transportation (Medical): Not on file  . Lack of Transportation (Non-Medical): Not on file  Physical Activity:   . Days of Exercise per Week: Not on file  . Minutes of Exercise per Session: Not on file  Stress:   . Feeling of Stress : Not on file  Social Connections:   . Frequency of Communication with Friends and Family: Not on file  . Frequency of Social Gatherings with Friends and Family: Not on file  . Attends Religious Services: Not on file  . Active Member of Clubs or Organizations: Not on file  . Attends Archivist Meetings: Not on file  . Marital Status: Not on file     Family History:  The patient's family history includes COPD in his father; Coronary artery disease in his father; Dementia (age of onset: 20) in his sister; Diabetes in his brother and sister; Hepatitis in his mother. There is no history of Colon cancer, Esophageal cancer, Rectal cancer, Stomach cancer, or Colon polyps.  ROS:   Please see the history of present illness.  All other systems are reviewed and otherwise negative.    EKGs/Labs/Other Studies Reviewed:    Studies reviewed are outlined and summarized above. Reports included below if pertinent.  Cardiac 01/2018 Conclusion    The left ventricular systolic function is normal.  LV end diastolic pressure is normal.  The left ventricular ejection fraction is 55-65% by visual estimate.    1. Minimal nonobstructive CAD 2. Normal LV function 3. Normal LVEDP  Plan: medical management.   Addendum:  during recovery patient developed Second degree AV block Mobitz type 1 with HR into the 40s. He is asymptomatic. No prior history  of dizziness or syncope. On no rate slowing medication. Baseline Ecg with very long PR interval. He is instructed to notify us if he develops symptoms. May need Holter monitor as outpatient.     2D Echo 01/2016  Study Conclusions   - Left ventricle: The cavity size was normal. There was moderate  concentric hypertrophy. Systolic function was normal. The  estimated ejection fraction was in the range of 60% to 65%. Wall  motion was normal; there were no regional wall motion  abnormalities. Features are consistent with a pseudonormal left  ventricular filling pattern, with concomitant abnormal relaxation  and increased filling pressure (grade 2 diastolic dysfunction).  - Aortic valve: There was mild regurgitation.   Transthoracic echocardiography. M-mode, complete 2D, spectral  Doppler, and color Doppler. Birthdate: Patient birthdate:  Dec 26, 1945. Age: Patient is 74 yr old. Sex: Gender: male.  BMI: 38.9 kg/m^2. Blood pressure:   130/52 Patient status:  Inpatient. Study date: Study date: 01/06/2016. Study time: 09:27  AM. Location: Bedside.    EKG:  EKG is not ordered today, but reviewed from 10/15/19 which showed AV pacing  Recent Labs: 12/30/2019: ALT 44; BUN 12; Creatinine, Ser 0.94; Hemoglobin 15.2; Magnesium 1.9; Platelets 161.0; Potassium 4.1; Sodium 137; TSH 1.80  Recent Lipid Panel    Component Value Date/Time   CHOL 150 03/18/2019 1011   CHOL 159 03/27/2013 0818   TRIG 146 03/18/2019 1011   TRIG 127 03/27/2013 0818   HDL 46 03/18/2019 1011   HDL 45 03/27/2013 0818   CHOLHDL 3.3 03/18/2019 1011   CHOLHDL 3 04/18/2018 1023   VLDL 26.2 04/18/2018 1023   LDLCALC 79 03/18/2019 1011   LDLCALC 89 03/27/2013 0818   LDLDIRECT 87.6 09/19/2012 1206    PHYSICAL EXAM:    VS:  BP 130/78   Pulse 73   Ht _0  (1.727 m)   Wt  255 lb 12.8 oz (116 kg)   SpO2 93%   BMI 38.89 kg/m   BMI: Body mass index is 38.89 kg/m.  GEN: Well nourished, well developed obese WM, in no acute distress HEENT: normocephalic, atraumatic Neck: no JVD, carotid bruits, or masses Cardiac: RRR; no murmurs, rubs, or gallops, minimal sockline edema Respiratory:  clear to auscultation bilaterally, normal work of breathing GI: soft, nontender, nondistended, + BS MS: no deformity or atrophy Skin: warm and dry, no rash Neuro:  Alert and Oriented x 3, Strength and sensation are intact, follows commands Psych: euthymic mood, full affect  Wt Readings from Last 3 Encounters:  06/01/20 255 lb 12.8 oz (116 kg)  02/25/20 261 lb (118.4 kg)  02/12/20 261 lb (118.4 kg)     ASSESSMENT & PLAN:   1. Chronic diastolic CHF -the patient appears euvolemic, he has no lower extremity edema orthopnea proximal nocturnal dyspnea, he seems to be doing well with low-sodium diet.   2. Bradycardia s/p PPM - this is followed by Dr. Curt Bears.  Our staff from the pacemaker clinic help him to download new app for his phone to monitor his pacemaker activity. 3. Essential HTN -repeat blood pressure at goal.. 4. OSA on CPAP -follows with Dr. Radford Pax. 5. Atypical chest pain - no change from prior baseline, his right chest wall pain seems to be musculoskeletal. 6. Abdominal pain -possibly secondary to indigestion after a large meal during Thanksgiving, the other option includes hernia, he is advised to follow with his primary care physician. 7. Morbid obesity -he was referred to [facility] [place], but he did not  feel like going.  He has lost 5 pounds on his own.  Disposition: Follow-up in 6 months.  Medication Adjustments/Labs and Tests Ordered: Current medicines are reviewed at length with the patient today.  Concerns regarding medicines are outlined above. Medication changes, Labs and Tests ordered today are summarized above and listed in the Patient  Instructions accessible in Encounters.   Signed, Ena Dawley, MD  06/01/2020 11:35 AM    [address], [place], [place]  [address] Phone: [phone]; Fax: [phone]

## 2020-06-01 NOTE — Patient Instructions (Signed)
Medication Instructions:   Your physician recommends that you continue on your current medications as directed. Please refer to the Current Medication list given to you today.  *If you need a refill on your cardiac medications before your next appointment, please call your pharmacy*   Follow-Up:  WITH DR. [name] IN 6 MONTHS IN THE OFFICE--SCHEDULING PLEASE ARRANGE FOR PATIENT TO BE SEEN SOMETIME IN MAY 2022   Other Instructions  --DEVICE CLINIC WANTS YOU TO DELETE THE APP ON YOUR PHONE, THEN RE-INSTALL THE APP [place] PHONE.  PLEASE MAKE SURE YOUR BLUETOOTH IS TURNED ON.  THIS SHOULD CORRECT EVERYTHING.  IF THIS DOES NOT CORRECT THE ISSUE THEN DEVICE CLINIC WANTS YOU TO CALL [employer] [facility] SUPPORT AT [phone] AND THEY WILL HELP YOU TROUBLE SHOOT--IF YOU HAVE ANY OTHER ISSUES PLEASE CALL OUR DEVICE TEAM AT [phone]--

## 2020-06-18 DIAGNOSIS — R5381 Other malaise: Secondary | ICD-10-CM | POA: Diagnosis not present

## 2020-06-18 DIAGNOSIS — R269 Unspecified abnormalities of gait and mobility: Secondary | ICD-10-CM | POA: Diagnosis not present

## 2020-06-18 DIAGNOSIS — R42 Dizziness and giddiness: Secondary | ICD-10-CM | POA: Diagnosis not present

## 2020-06-18 DIAGNOSIS — R2681 Unsteadiness on feet: Secondary | ICD-10-CM | POA: Diagnosis not present

## 2020-06-19 DIAGNOSIS — H35373 Puckering of macula, bilateral: Secondary | ICD-10-CM | POA: Diagnosis not present

## 2020-06-19 DIAGNOSIS — H52202 Unspecified astigmatism, left eye: Secondary | ICD-10-CM | POA: Diagnosis not present

## 2020-06-19 DIAGNOSIS — H532 Diplopia: Secondary | ICD-10-CM | POA: Diagnosis not present

## 2020-06-19 DIAGNOSIS — H26493 Other secondary cataract, bilateral: Secondary | ICD-10-CM | POA: Diagnosis not present

## 2020-06-29 DIAGNOSIS — R2681 Unsteadiness on feet: Secondary | ICD-10-CM | POA: Diagnosis not present

## 2020-06-29 DIAGNOSIS — R269 Unspecified abnormalities of gait and mobility: Secondary | ICD-10-CM | POA: Diagnosis not present

## 2020-06-29 DIAGNOSIS — R42 Dizziness and giddiness: Secondary | ICD-10-CM | POA: Diagnosis not present

## 2020-06-29 DIAGNOSIS — R5381 Other malaise: Secondary | ICD-10-CM | POA: Diagnosis not present

## 2020-07-01 ENCOUNTER — Ambulatory Visit (INDEPENDENT_AMBULATORY_CARE_PROVIDER_SITE_OTHER): Payer: Medicare Other

## 2020-07-01 DIAGNOSIS — I495 Sick sinus syndrome: Secondary | ICD-10-CM

## 2020-07-02 LAB — CUP PACEART REMOTE DEVICE CHECK
Battery Remaining Longevity: 110 mo
Battery Voltage: 3 V
Brady Statistic AP VP Percent: 85 %
Brady Statistic AP VS Percent: 0.12 %
Brady Statistic AS VP Percent: 13.37 %
Brady Statistic AS VS Percent: 1.5 %
Brady Statistic RA Percent Paced: 85.82 %
Brady Statistic RV Percent Paced: 98.37 %
Date Time Interrogation Session: 20211230094408
Implantable Lead Implant Date: 20191230
Implantable Lead Implant Date: 20191230
Implantable Lead Location: 753859
Implantable Lead Location: 753860
Implantable Lead Model: 5076
Implantable Lead Model: 5076
Implantable Pulse Generator Implant Date: 20191230
Lead Channel Impedance Value: 304 Ohm
Lead Channel Impedance Value: 323 Ohm
Lead Channel Impedance Value: 399 Ohm
Lead Channel Impedance Value: 627 Ohm
Lead Channel Pacing Threshold Amplitude: 0.75 V
Lead Channel Pacing Threshold Amplitude: 1 V
Lead Channel Pacing Threshold Pulse Width: 0.4 ms
Lead Channel Pacing Threshold Pulse Width: 0.4 ms
Lead Channel Sensing Intrinsic Amplitude: 2.125 mV
Lead Channel Sensing Intrinsic Amplitude: 2.125 mV
Lead Channel Sensing Intrinsic Amplitude: 20.375 mV
Lead Channel Sensing Intrinsic Amplitude: 20.375 mV
Lead Channel Setting Pacing Amplitude: 1.5 V
Lead Channel Setting Pacing Amplitude: 2.5 V
Lead Channel Setting Pacing Pulse Width: 0.4 ms
Lead Channel Setting Sensing Sensitivity: 1.2 mV

## 2020-07-14 DIAGNOSIS — H532 Diplopia: Secondary | ICD-10-CM | POA: Diagnosis not present

## 2020-07-15 DIAGNOSIS — Z7409 Other reduced mobility: Secondary | ICD-10-CM | POA: Diagnosis not present

## 2020-07-15 DIAGNOSIS — R2681 Unsteadiness on feet: Secondary | ICD-10-CM | POA: Diagnosis not present

## 2020-07-15 DIAGNOSIS — R42 Dizziness and giddiness: Secondary | ICD-10-CM | POA: Diagnosis not present

## 2020-07-15 DIAGNOSIS — R269 Unspecified abnormalities of gait and mobility: Secondary | ICD-10-CM | POA: Diagnosis not present

## 2020-07-15 DIAGNOSIS — R5381 Other malaise: Secondary | ICD-10-CM | POA: Diagnosis not present

## 2020-07-15 NOTE — Progress Notes (Signed)
Remote pacemaker transmission.   

## 2020-07-17 DIAGNOSIS — R5381 Other malaise: Secondary | ICD-10-CM | POA: Diagnosis not present

## 2020-07-17 DIAGNOSIS — R42 Dizziness and giddiness: Secondary | ICD-10-CM | POA: Diagnosis not present

## 2020-07-17 DIAGNOSIS — R269 Unspecified abnormalities of gait and mobility: Secondary | ICD-10-CM | POA: Diagnosis not present

## 2020-07-17 DIAGNOSIS — R2681 Unsteadiness on feet: Secondary | ICD-10-CM | POA: Diagnosis not present

## 2020-07-17 DIAGNOSIS — Z7409 Other reduced mobility: Secondary | ICD-10-CM | POA: Diagnosis not present

## 2020-07-24 DIAGNOSIS — R42 Dizziness and giddiness: Secondary | ICD-10-CM | POA: Diagnosis not present

## 2020-07-24 DIAGNOSIS — R269 Unspecified abnormalities of gait and mobility: Secondary | ICD-10-CM | POA: Diagnosis not present

## 2020-07-24 DIAGNOSIS — R5381 Other malaise: Secondary | ICD-10-CM | POA: Diagnosis not present

## 2020-07-24 DIAGNOSIS — Z7409 Other reduced mobility: Secondary | ICD-10-CM | POA: Diagnosis not present

## 2020-07-24 DIAGNOSIS — R2681 Unsteadiness on feet: Secondary | ICD-10-CM | POA: Diagnosis not present

## 2020-07-28 ENCOUNTER — Other Ambulatory Visit: Payer: Self-pay | Admitting: Cardiology

## 2020-07-28 ENCOUNTER — Other Ambulatory Visit: Payer: Self-pay | Admitting: Physician Assistant

## 2020-07-28 MED FILL — ROSUVASTATIN CALCIUM 5 MG T: 5 | 84 days supply | Qty: 36 | Fill #0

## 2020-07-29 DIAGNOSIS — R5381 Other malaise: Secondary | ICD-10-CM | POA: Diagnosis not present

## 2020-07-29 DIAGNOSIS — R269 Unspecified abnormalities of gait and mobility: Secondary | ICD-10-CM | POA: Diagnosis not present

## 2020-07-29 DIAGNOSIS — R42 Dizziness and giddiness: Secondary | ICD-10-CM | POA: Diagnosis not present

## 2020-07-29 DIAGNOSIS — Z7409 Other reduced mobility: Secondary | ICD-10-CM | POA: Diagnosis not present

## 2020-07-29 DIAGNOSIS — R2681 Unsteadiness on feet: Secondary | ICD-10-CM | POA: Diagnosis not present

## 2020-08-03 DIAGNOSIS — R42 Dizziness and giddiness: Secondary | ICD-10-CM | POA: Diagnosis not present

## 2020-08-03 DIAGNOSIS — R2681 Unsteadiness on feet: Secondary | ICD-10-CM | POA: Diagnosis not present

## 2020-08-03 DIAGNOSIS — R5381 Other malaise: Secondary | ICD-10-CM | POA: Diagnosis not present

## 2020-08-03 DIAGNOSIS — Z7409 Other reduced mobility: Secondary | ICD-10-CM | POA: Diagnosis not present

## 2020-08-03 DIAGNOSIS — R269 Unspecified abnormalities of gait and mobility: Secondary | ICD-10-CM | POA: Diagnosis not present

## 2020-08-21 DIAGNOSIS — H26492 Other secondary cataract, left eye: Secondary | ICD-10-CM | POA: Diagnosis not present

## 2020-08-24 ENCOUNTER — Other Ambulatory Visit: Payer: Self-pay | Admitting: Cardiology

## 2020-08-24 MED FILL — FUROSEMIDE 40 MG TAB: 40 | 90 days supply | Qty: 90 | Fill #2

## 2020-08-25 ENCOUNTER — Other Ambulatory Visit: Payer: Self-pay | Admitting: Cardiology

## 2020-08-25 MED FILL — NITROGLYCERIN 0.4 MG TAB SL: 0.4 | 7 days supply | Qty: 25 | Fill #0

## 2020-09-02 DIAGNOSIS — G8929 Other chronic pain: Secondary | ICD-10-CM | POA: Diagnosis not present

## 2020-09-02 DIAGNOSIS — M25562 Pain in left knee: Secondary | ICD-10-CM | POA: Diagnosis not present

## 2020-09-02 DIAGNOSIS — M25561 Pain in right knee: Secondary | ICD-10-CM | POA: Diagnosis not present

## 2020-09-02 DIAGNOSIS — Z982 Presence of cerebrospinal fluid drainage device: Secondary | ICD-10-CM | POA: Diagnosis not present

## 2020-09-03 DIAGNOSIS — Z7409 Other reduced mobility: Secondary | ICD-10-CM | POA: Diagnosis not present

## 2020-09-03 DIAGNOSIS — R5381 Other malaise: Secondary | ICD-10-CM | POA: Diagnosis not present

## 2020-09-03 DIAGNOSIS — R269 Unspecified abnormalities of gait and mobility: Secondary | ICD-10-CM | POA: Diagnosis not present

## 2020-09-03 DIAGNOSIS — R2681 Unsteadiness on feet: Secondary | ICD-10-CM | POA: Diagnosis not present

## 2020-09-04 ENCOUNTER — Other Ambulatory Visit (HOSPITAL_BASED_OUTPATIENT_CLINIC_OR_DEPARTMENT_OTHER): Payer: Self-pay | Admitting: Ophthalmology

## 2020-09-04 DIAGNOSIS — H26493 Other secondary cataract, bilateral: Secondary | ICD-10-CM | POA: Diagnosis not present

## 2020-09-04 MED FILL — FLUOROMETHOLONE 0.1% DROPS: 0.1 | 25 days supply | Qty: 5 | Fill #0

## 2020-09-16 ENCOUNTER — Telehealth: Payer: Self-pay | Admitting: Internal Medicine

## 2020-09-16 DIAGNOSIS — M2342 Loose body in knee, left knee: Secondary | ICD-10-CM | POA: Diagnosis not present

## 2020-09-16 DIAGNOSIS — I999 Unspecified disorder of circulatory system: Secondary | ICD-10-CM | POA: Diagnosis not present

## 2020-09-16 DIAGNOSIS — M25562 Pain in left knee: Secondary | ICD-10-CM | POA: Diagnosis not present

## 2020-09-16 DIAGNOSIS — M25561 Pain in right knee: Secondary | ICD-10-CM | POA: Diagnosis not present

## 2020-09-16 DIAGNOSIS — M170 Bilateral primary osteoarthritis of knee: Secondary | ICD-10-CM | POA: Diagnosis not present

## 2020-09-16 DIAGNOSIS — G8929 Other chronic pain: Secondary | ICD-10-CM | POA: Diagnosis not present

## 2020-09-16 DIAGNOSIS — M2341 Loose body in knee, right knee: Secondary | ICD-10-CM | POA: Diagnosis not present

## 2020-09-16 NOTE — Telephone Encounter (Signed)
LVM for pt to rtn my call to schedule AWV with [facility]. Please schedule appt if pt calls the office.  

## 2020-09-28 ENCOUNTER — Other Ambulatory Visit (HOSPITAL_BASED_OUTPATIENT_CLINIC_OR_DEPARTMENT_OTHER): Payer: Self-pay | Admitting: Ophthalmology

## 2020-09-28 DIAGNOSIS — H26492 Other secondary cataract, left eye: Secondary | ICD-10-CM | POA: Diagnosis not present

## 2020-09-30 ENCOUNTER — Ambulatory Visit (INDEPENDENT_AMBULATORY_CARE_PROVIDER_SITE_OTHER): Payer: Medicare Other

## 2020-09-30 DIAGNOSIS — I495 Sick sinus syndrome: Secondary | ICD-10-CM | POA: Diagnosis not present

## 2020-10-02 LAB — CUP PACEART REMOTE DEVICE CHECK
Battery Remaining Longevity: 109 mo
Battery Voltage: 3 V
Brady Statistic AP VP Percent: 85.64 %
Brady Statistic AP VS Percent: 0.15 %
Brady Statistic AS VP Percent: 12.97 %
Brady Statistic AS VS Percent: 1.24 %
Brady Statistic RA Percent Paced: 86.24 %
Brady Statistic RV Percent Paced: 98.61 %
Date Time Interrogation Session: 20220401091239
Implantable Lead Implant Date: 20191230
Implantable Lead Implant Date: 20191230
Implantable Lead Location: 753859
Implantable Lead Location: 753860
Implantable Lead Model: 5076
Implantable Lead Model: 5076
Implantable Pulse Generator Implant Date: 20191230
Lead Channel Impedance Value: 323 Ohm
Lead Channel Impedance Value: 380 Ohm
Lead Channel Impedance Value: 418 Ohm
Lead Channel Impedance Value: 646 Ohm
Lead Channel Pacing Threshold Amplitude: 0.625 V
Lead Channel Pacing Threshold Amplitude: 1.125 V
Lead Channel Pacing Threshold Pulse Width: 0.4 ms
Lead Channel Pacing Threshold Pulse Width: 0.4 ms
Lead Channel Sensing Intrinsic Amplitude: 2.5 mV
Lead Channel Sensing Intrinsic Amplitude: 2.5 mV
Lead Channel Sensing Intrinsic Amplitude: 20.375 mV
Lead Channel Sensing Intrinsic Amplitude: 20.375 mV
Lead Channel Setting Pacing Amplitude: 1.5 V
Lead Channel Setting Pacing Amplitude: 2.5 V
Lead Channel Setting Pacing Pulse Width: 0.4 ms
Lead Channel Setting Sensing Sensitivity: 1.2 mV

## 2020-10-05 DIAGNOSIS — H26491 Other secondary cataract, right eye: Secondary | ICD-10-CM | POA: Diagnosis not present

## 2020-10-13 NOTE — Progress Notes (Signed)
Remote pacemaker transmission.   

## 2020-10-25 MED FILL — Rosuvastatin Calcium Tab 5 MG: ORAL | 84 days supply | Qty: 36 | Fill #0 | Status: AC

## 2020-10-26 ENCOUNTER — Other Ambulatory Visit (HOSPITAL_BASED_OUTPATIENT_CLINIC_OR_DEPARTMENT_OTHER): Payer: Self-pay

## 2020-10-26 DIAGNOSIS — Z79899 Other long term (current) drug therapy: Secondary | ICD-10-CM | POA: Diagnosis not present

## 2020-10-26 DIAGNOSIS — H903 Sensorineural hearing loss, bilateral: Secondary | ICD-10-CM | POA: Diagnosis not present

## 2020-10-26 DIAGNOSIS — R413 Other amnesia: Secondary | ICD-10-CM | POA: Diagnosis not present

## 2020-10-26 DIAGNOSIS — G40109 Localization-related (focal) (partial) symptomatic epilepsy and epileptic syndromes with simple partial seizures, not intractable, without status epilepticus: Secondary | ICD-10-CM | POA: Diagnosis not present

## 2020-10-26 DIAGNOSIS — R41841 Cognitive communication deficit: Secondary | ICD-10-CM | POA: Diagnosis not present

## 2020-10-26 DIAGNOSIS — H832X9 Labyrinthine dysfunction, unspecified ear: Secondary | ICD-10-CM | POA: Diagnosis not present

## 2020-10-26 DIAGNOSIS — G40209 Localization-related (focal) (partial) symptomatic epilepsy and epileptic syndromes with complex partial seizures, not intractable, without status epilepticus: Secondary | ICD-10-CM | POA: Diagnosis not present

## 2020-10-27 NOTE — Progress Notes (Signed)
Subjective:    Patient ID: Jeff Weaver, male    DOB: 01-31-1946, 75 y.o.   MRN: [id number]  HPI The patient is here for an acute visit.  He is here today with his wife.   Leg abrasion -he 5 days ago.  He was going up his back porch and tripped on the bottom step and hit his left lower leg on the step and his right knee on the step.  He states a couple of other mild bruises, but no other major injuries.  No head trauma.  He denies any bleeding at that time.  His wife did clean the wounds with hydrogen peroxide and put on Neosporin.  His wounds have dried up and there is no discharge.  They will have mild scabs with surrounding erythema.  No open wounds or discharge.  The areas are tender and they are concerned about a possible infection because of the redness.  Because of his seizure disorder is recommended that he does not have any infections or treat them early because the infections can trigger seizures.  He denies any fevers.      Medications and allergies reviewed with patient and updated if appropriate.  Patient Active Problem List   Diagnosis Date Noted  . Abrasion of leg with infection, left, initial encounter 10/28/2020  . Seizures ([place])   . Muscle cramping 12/30/2019  . Dizziness 12/30/2019  . Unsteady gait 12/30/2019  . Pacemaker 11/27/2019  . Fatigue 07/09/2019  . SSS (sick sinus syndrome) ([place]) 07/02/2018  . Bilateral shoulder pain 06/14/2018  . Upper airway cough syndrome vs cough variant asthma 03/20/2018  . Acute midline low back pain without sciatica 12/25/2017  . Depression 10/17/2017  . CAD (coronary artery disease), hx of NSTEMI 08/15/2017  . Chest pain 07/17/2017  . SOBOE (shortness of breath on exertion) 06/08/2017  . Bilateral sensorineural hearing loss 06/01/2017  . Nasal turbinate hypertrophy 06/01/2017  . Dysphagia 05/31/2017  . Osteoporosis 01/30/2017  . History of cerebral hemorrhage 12/28/2016  . Seborrheic dermatitis 07/25/2016  . Cognitive  communication deficit 06/28/2016  . Hypertensive heart disease 06/28/2016  . Vitamin B 12 deficiency 03/16/2016  . Memory difficulties 03/13/2016  . 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 Orthostatic hypotension 01/05/2016  . Benign prostatic hyperplasia with urinary obstruction 08/04/2014  . Urinary urgency 08/04/2014  . Gastroesophageal reflux disease 07/14/2014  . Polymyalgia rheumatica ([place]) 05/28/2014  . 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 SBE (subacute bacterial endocarditis) prophylaxis candidate 11/24/2013  . Aphasia 07/17/2013  . 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 seizures, not intractable, without status epilepticus ([place]) 07/17/2013  . Subdural hemorrhage ([place]) 07/17/2013  . 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[place]) 07/17/2013  . Pre-diabetes 03/26/2013  . Increased frequency of urination 10/31/2012  . Nocturia 10/31/2012  . History of cerebral aneurysm repair 10/18/2012  . History of stroke without residual deficits 10/18/2012  . Blepharitis of both eyes 09/27/2012  . 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 Seizure disorder ([place]) 07/23/2012  . Entropion 07/10/2012  . Hyperopia with astigmatism 07/10/2012  . Combined senile cataract 04/13/2012  . Presbyopia 04/13/2012  . 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 artery ([place]) 03/01/2012  . Cognitive safety issue 02/20/2012  . Nonruptured cerebral aneurysm 11/23/2011  . Obstructive sleep apnea syndrome 08/09/2011  . Hyperlipidemia 02/03/2009  . HTN (hypertension) 02/03/2009  . Osteoarthritis 02/03/2009  . History of Crohn's disease 04/17/2008    Current Outpatient Medications on File Prior to Visit  Medication Sig Dispense  Refill  . acetaminophen (TYLENOL) 325 MG tablet Take 325-650 mg by mouth every 6 (six) hours as needed for headache.    Marland Kitchen amoxicillin (AMOXIL) 500 MG capsule Take 2,000 mg by mouth as directed. Take 2000 mg by mouth 1 hour prior to dental appointments     .  aspirin EC 81 MG tablet Take 1 tablet (81 mg total) by mouth daily. 90 tablet 3  . cetirizine (ZYRTEC) 10 MG tablet Take 1 tablet (10 mg total) by mouth daily. 30 tablet 11  . Cholecalciferol (VITAMIN D) 50 MCG (2000 UT) CAPS Take 1 capsule by mouth daily.    . fluticasone (FLONASE) 50 MCG/ACT nasal spray Place 1-2 sprays into both nostrils daily as needed.     . furosemide (LASIX) 40 MG tablet TAKE 1 TABLET (40 MG TOTAL) BY MOUTH DAILY. 90 tablet 2  . levETIRAcetam (KEPPRA) 500 MG tablet Take 2,500 mg by mouth 2 (two) times daily. 2 TABLETS BY MOUTH IN AM AND 3 TABS IN THE EVENING.    . LORazepam (ATIVAN) 1 MG tablet Take 1 mg by mouth every 6 (six) hours as needed for seizure.     . mirabegron ER (MYRBETRIQ) 50 MG TB24 tablet Take 1 tablet (50 mg total) by mouth daily. 90 tablet 1  . nitroGLYCERIN (NITROSTAT) 0.4 MG SL tablet PLACE 1 TABLET (0.4 MG TOTAL) UNDER THE TONGUE EVERY 5 (FIVE) MINUTES AS NEEDED FOR CHEST PAIN. 25 tablet 7  . NON FORMULARY CPAP Machine: authorized by [facility] in [place]     . nystatin ointment (MYCOSTATIN) Apply 1 application topically 3 (three) times daily. 30 g 1  . pyridOXINE (VITAMIN B-6) 100 MG tablet Take 100 mg by mouth daily.    . rosuvastatin (CRESTOR) 5 MG tablet TAKE 1 TABLET BY MOUTH THREE TIMES A WEEK AND INCREASE AS TOLERATED 45 tablet 3  . Tamsulosin HCl (FLOMAX) 0.4 MG CAPS Take 0.4 mg by mouth every evening.    . thiamine 100 MG tablet Take 100 mg by mouth daily.    Marland Kitchen VIMPAT 200 MG TABS tablet Take 200 mg by mouth 2 (two) times daily.    . vitamin B-12 (CYANOCOBALAMIN) 1000 MCG tablet Take 1,000 mcg by mouth daily.    . clonazePAM (KLONOPIN) 1 MG disintegrating tablet ALLOW 1 TABLET TO DISSOLVE IN CHEEK FOR SEIZURES LASTING LONGER THAN 4 MINUTES. OR AFTER 2ND SEIZURE IN 24 HOURS    . omeprazole (PRILOSEC) 40 MG capsule TAKE 1 CAPSULE (40 MG TOTAL) BY MOUTH DAILY. 90 capsule 2   No current facility-administered medications on file prior to visit.    Past  Medical History:  Diagnosis Date  . Aortic insufficiency    a. mild 2017 echo.  . Cerebral aneurysm    a. s/p clipping at [facility] in 8/13 c/b short-term memory loss, cerebral hemorrhage and seizure disorder s/p VP shunt.  . Cerebral hemorrhage ([place])   . Chronic diastolic CHF (congestive heart failure) ([place])   . CVA (cerebral infarction)   . Diverticulosis   . Enteritis (regional)    Dr Olevia Perches  . First degree AV block   . GI bleed   . HTN (hypertension)   . Hyperlipidemia   . Hypertensive heart disease   . IBS (irritable bowel syndrome)   . Mild CAD    Cardiac cath 01/12/18 showed minimal nonobstructive CAD, normal LVEF, normal LVEDP.  Marland Kitchen NSVT (nonsustained ventricular tachycardia) ([place])   . Orthostatic hypotension   . Pre-diabetes   . PVC's (premature  ventricular contractions)   . 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 Rheumatoid arthritis(714.0)    dxed in [employer] in 1980s  . Second degree AV block, Mobitz type I   . Seizures ([place])    last April 26,2021  . Sinus bradycardia   . Sleep apnea   . SSS (sick sinus syndrome) ([place])    a. s/p PPM 07/02/18.    Past Surgical History:  Procedure Laterality Date  . CHOLECYSTECTOMY N/A 01/07/2016   Procedure: LAPAROSCOPIC CHOLECYSTECTOMY ;  Surgeon: Coralie Keens, MD;  Location: [facility];  Service: General;  Laterality: N/A;  . cns shunt  02/23/12  . COLONOSCOPY W/ POLYPECTOMY  1978   negative 2009, Dr Olevia Perches. Due 2014  . CRANIOTOMY  02/02/12   Dr Harvel Ricks, [place] of aneurysm  . 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 HERNIA REPAIR    . LEFT HEART CATH AND CORONARY ANGIOGRAPHY N/A 01/12/2018   Procedure: LEFT HEART CATH AND CORONARY ANGIOGRAPHY;  Surgeon: Martinique, Peter M, MD;  Location: [facility];  Service: Cardiovascular;  Laterality: N/A;  . LEFT HEART CATHETERIZATION WITH CORONARY ANGIOGRAM N/A 11/26/2013   Procedure: LEFT HEART  CATHETERIZATION WITH CORONARY ANGIOGRAM;  Surgeon: [name], MD;  Location[facility] CATH LAB;  Service: Cardiovascular;  Laterality: N/A;  . NOSE SURGERY    . PACEMAKER IMPLANT N/A 07/02/2018   Procedure: PACEMAKER IMPLANT;  Surgeon: Constance Haw, MD;  Location: [facility];  Service: Cardiovascular;  Laterality: N/A;  . SHOULDER SURGERY  1997  . TONSILLECTOMY AND ADENOIDECTOMY    . VENTRICULOPERITONEAL SHUNT  02-23-12   INSERTION OF RIGHT FRONTAL VENTRICULOPERITONEAL SHUNT WITH A CODMAN HAKIM PROGRAMMABLE VALVE    Social History   Socioeconomic History  . Marital status: Married    Spouse name: Not on file  . Number of children: 1  . Years of education: Not on file  . Highest education level: Not on file  Occupational History  . Occupation: retired/ Education officer, community  Tobacco Use  . Smoking status: Former Smoker    Packs/day: 0.00    Years: 30.00    Pack years: 0.00    Types: Pipe    Quit date: 07/04/2008    Years since quitting: 12.3  . Smokeless tobacco: Former Network engineer  . Vaping Use: Never used  Substance and Sexual Activity  . Alcohol use: Yes    Alcohol/week: 0.0 standard drinks    Comment: Very Infrequently   . Drug use: No  . Sexual activity: Not Currently  Other Topics Concern  . Not on file  Social History Narrative  . Not on file   Social Determinants of Health   Financial Resource Strain: Not on file  Food Insecurity: Not on file  Transportation Needs: Not on file  Physical Activity: Not on file  Stress: Not on file  Social Connections: Not on file    Family History  Problem Relation Age of Onset  . COPD Father   . Coronary artery disease Father        MI in 70s  . Hepatitis Mother   . Diabetes Sister   . Dementia Sister 31  . Diabetes Brother   . Colon cancer Neg Hx   . Esophageal cancer Neg Hx   . Rectal cancer Neg Hx   . Stomach cancer Neg Hx   . Colon polyps Neg Hx  Review of Systems Per HPI    Objective:    Vitals:   10/28/20 1545  BP: 128/76  Pulse: 70  Temp: 98.6 F (37 C)  SpO2: 96%   BP Readings from Last 3 Encounters:  10/28/20 128/76  06/01/20 130/78  02/25/20 138/81   Wt Readings from Last 3 Encounters:  10/28/20 247 lb (112 kg)  06/01/20 255 lb 12.8 oz (116 kg)  02/25/20 261 lb (118.4 kg)   Body mass index is 37.56 kg/m.   Physical Exam Constitutional:      General: He is not in acute distress.    Appearance: Normal appearance. He is not ill-appearing.  HENT:     Head: Normocephalic and atraumatic.  Musculoskeletal:     Right lower leg: Edema (Mild) present.     Left lower leg: Edema (Mild) present.  Skin:    General: Skin is warm and dry.     Comments: 1 extremely small scab with minimal erythema right knee.  3 abrasions on left lower leg.  One is small with slight scab and minimal erythema just below the kneecap.  Nontender, no fluctuance.  Second wound is mid lower leg approximately the size of a pea.  Small scab with minimal erythema.  Minimally tender.  There is a scab it is lower anterior left leg with scab and moderate erythema that is slightly warm and tender to palpation.  Scab is the size of a quarter.  No fluctuance.  No discharge or bleeding  Neurological:     Mental Status: He is alert.            Assessment & Plan:    See Problem List for Assessment and Plan of chronic medical problems.    This visit occurred during the SARS-CoV-2 public health emergency.  Safety protocols were in place, including screening questions prior to the visit, additional usage of staff PPE, and extensive cleaning of exam room while observing appropriate contact time as indicated for disinfecting solutions.

## 2020-10-28 ENCOUNTER — Ambulatory Visit (INDEPENDENT_AMBULATORY_CARE_PROVIDER_SITE_OTHER): Payer: Medicare Other | Admitting: Internal Medicine

## 2020-10-28 ENCOUNTER — Other Ambulatory Visit (HOSPITAL_BASED_OUTPATIENT_CLINIC_OR_DEPARTMENT_OTHER): Payer: Self-pay

## 2020-10-28 ENCOUNTER — Encounter: Payer: Self-pay | Admitting: Internal Medicine

## 2020-10-28 ENCOUNTER — Other Ambulatory Visit: Payer: Self-pay

## 2020-10-28 DIAGNOSIS — L089 Local infection of the skin and subcutaneous tissue, unspecified: Secondary | ICD-10-CM | POA: Insufficient documentation

## 2020-10-28 DIAGNOSIS — S80812A Abrasion, left lower leg, initial encounter: Secondary | ICD-10-CM

## 2020-10-28 MED ORDER — DOXYCYCLINE HYCLATE 100 MG PO TABS
100.0000 mg | ORAL_TABLET | Freq: Two times a day (BID) | ORAL | 0 refills | Status: DC
  Filled 2020-10-28: qty 14, 7d supply, fill #0

## 2020-10-28 NOTE — Assessment & Plan Note (Signed)
Acute Abrasion left lower leg does look mildly infected Given his seizure history and seizures that can be induced by an infection we will be conservative and go ahead and treat this although it is a mild infection Doxycycline 100 mg twice daily x1 week Advised that he can put Vaseline on the scabs to help them heal faster They will monitor closely and call if his wounds do not continue to heal appropriately

## 2020-10-28 NOTE — Patient Instructions (Signed)
Take the doxycycline as directed

## 2020-11-05 ENCOUNTER — Encounter: Payer: Self-pay | Admitting: Physician Assistant

## 2020-11-05 DIAGNOSIS — M25562 Pain in left knee: Secondary | ICD-10-CM | POA: Diagnosis not present

## 2020-11-05 DIAGNOSIS — L089 Local infection of the skin and subcutaneous tissue, unspecified: Secondary | ICD-10-CM | POA: Diagnosis not present

## 2020-11-05 DIAGNOSIS — Z888 Allergy status to other drugs, medicaments and biological substances status: Secondary | ICD-10-CM | POA: Diagnosis not present

## 2020-11-05 DIAGNOSIS — G8929 Other chronic pain: Secondary | ICD-10-CM | POA: Diagnosis not present

## 2020-11-05 DIAGNOSIS — S80812D Abrasion, left lower leg, subsequent encounter: Secondary | ICD-10-CM | POA: Diagnosis not present

## 2020-11-05 DIAGNOSIS — M25561 Pain in right knee: Secondary | ICD-10-CM | POA: Diagnosis not present

## 2020-11-05 DIAGNOSIS — S80812A Abrasion, left lower leg, initial encounter: Secondary | ICD-10-CM | POA: Diagnosis not present

## 2020-11-05 NOTE — Progress Notes (Signed)
Chronic    Cardiology Office Note    Date:  11/09/2020   ID:  Jeff Weaver, DOB 12/08/1945, MRN [id number]  PCP:  [name], MD  Cardiologist:  Ena Dawley, MD (Inactive)  Electrophysiologist:  Will Meredith Leeds, MD   Chief Complaint: f/u CHF, bradycardia  History of Present Illness:   Jeff Weaver is a 75 y.o. 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   He has a history of chronic atypical chest pain. He was first seen by Dr. [name] 04/2013 for this and underwent nuclear stress test which was normal. He was admitted 11/2013 for chest pain and mildly elevated troponin -> LHC 11/26/13 revealed angiographically normal coronary arteries, mild mid LAD myocardial bridging, moderate to severely elevated LVEDP with systemic hypertension and preserved LVEF. Last echo in 01/2016 showed moderate LVH, EF 60-65%, grade 2 DD, no RWMA, mild AI. Prior PFTs for DOE showed minimal Obstructive Airways Disease, no significant COPD. Repeat cardiac cath 01/12/18 showed minimal nonobstructive CAD, normal LVEF, normal LVEDP. He had 2nd degree AVB type 1 during cath. He wore a monitor 02/2018 which showed profound bradycardia, AVB with frequently nonconducted P waves, frequent PVCs (7%) and short runs of NSVT. He underwent Medtronic Azure XT DR MRI SureScan dual-chamber pacemaker and has done well from a cardiac standpoint. He was previously trialed on PPI for atypical chest pain. He was euvolemic and doing well when I saw him back in 11/2019. He saw Dr. [name] last in November 2021 and was clinically stable from cardiac standpoint. He has declined referral to  [facility] [place].  He is seen back for follow-up today with his wife and daughter. They aid in providing the history due to some of his residual memory issues. In general he is overall doing well. He still has intermittent atypical chest pain but no anginal-type symptoms. Symptoms occur in a traveling nature down to his abdomen and back, making a sound that sounds like "pecan shells in a bucket," not occurring with exertion. He fell recently trying to maneuver on the patio and completed a course of doxycycline for a leg wound by his PCP. They state it looks much better. Breathing is stable. He has had a chronic cough for years. He also reports a lot of mucus production in general. He has not been using CPAP as the device is on recall and they have not been back to see anyone lately for this.  Labwork independently reviewed: 03/2020 CBC wnl, K 4.4, Cr 0.95, LFTs ok 12/2019 TSH wnl 2020 LDL 79  Past Medical History:  Diagnosis Date  . Aortic insufficiency    a. mild 2017 echo.  . Atypical chest pain   . Cerebral aneurysm    a. s/p clipping at [facility] in 8/13 c/b short-term memory loss, cerebral hemorrhage and seizure disorder s/p VP shunt.  . Cerebral hemorrhage ([place])   . Chronic diastolic CHF (congestive heart failure) ([place])   . CVA (cerebral infarction)   . Diverticulosis   . Enteritis (regional)    Dr Olevia Perches  . First degree AV block   . GI bleed   . HTN (hypertension)   .  Hyperlipidemia   . Hypertensive heart disease   . IBS (irritable bowel syndrome)   . Mild CAD    Cardiac cath 01/12/18 showed minimal nonobstructive CAD, normal LVEF, normal LVEDP.  Marland Kitchen NSVT (nonsustained ventricular tachycardia) ([place])   . Orthostatic hypotension   . Pacemaker   . Pre-diabetes   . PVC's (premature ventricular contractions)   . 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 Rheumatoid arthritis(714.0)    dxed in [employer] in 1980s  . Second degree AV block, Mobitz type I   .  Seizures ([place])    last April 26,2021  . Sinus bradycardia   . Sleep apnea   . SSS (sick sinus syndrome) ([place])    a. s/p PPM 07/02/18.    Past Surgical History:  Procedure Laterality Date  . CHOLECYSTECTOMY N/A 01/07/2016   Procedure: LAPAROSCOPIC CHOLECYSTECTOMY ;  Surgeon: Coralie Keens, MD;  Location: [facility];  Service: General;  Laterality: N/A;  . cns shunt  02/23/12  . COLONOSCOPY W/ POLYPECTOMY  1978   negative 2009, Dr Olevia Perches. Due 2014  . CRANIOTOMY  02/02/12   Dr Harvel Ricks, [facility] of aneurysm  . 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 HERNIA REPAIR    . LEFT HEART CATH AND CORONARY ANGIOGRAPHY N/A 01/12/2018   Procedure: LEFT HEART CATH AND CORONARY ANGIOGRAPHY;  Surgeon: Martinique, Peter M, MD;  Location: [facility];  Service: Cardiovascular;  Laterality: N/A;  . LEFT HEART CATHETERIZATION WITH CORONARY ANGIOGRAM N/A 11/26/2013   Procedure: LEFT HEART CATHETERIZATION WITH CORONARY ANGIOGRAM;  Surgeon: [name], MD;  Location: [facility] CATH LAB;  Service: Cardiovascular;  Laterality: N/A;  . NOSE SURGERY    . PACEMAKER IMPLANT N/A 07/02/2018   Procedure: PACEMAKER IMPLANT;  Surgeon: Constance Haw, MD;  Location: [facility];  Service: Cardiovascular;  Laterality: N/A;  . SHOULDER SURGERY  1997  . TONSILLECTOMY AND ADENOIDECTOMY    . VENTRICULOPERITONEAL SHUNT  02-23-12   INSERTION OF RIGHT FRONTAL VENTRICULOPERITONEAL SHUNT WITH A CODMAN HAKIM PROGRAMMABLE VALVE    Current Medications: Current Meds  Medication Sig  . acetaminophen (TYLENOL) 325 MG tablet Take 325-650 mg by mouth every 6 (six) hours as needed for headache.  Marland Kitchen amoxicillin (AMOXIL) 500 MG capsule Take 2,000 mg by mouth as directed. Take 2000 mg by mouth 1 hour prior to dental appointments   . aspirin EC 81 MG tablet Take 1 tablet (81 mg total) by mouth daily.  . cetirizine (ZYRTEC) 10 MG tablet Take 1 tablet (10 mg  total) by mouth daily.  . Cholecalciferol (VITAMIN D) 50 MCG (2000 UT) CAPS Take 1 capsule by mouth daily.  . fluticasone (FLONASE) 50 MCG/ACT nasal spray Place 1-2 sprays into both nostrils daily as needed.   . furosemide (LASIX) 40 MG tablet TAKE 1 TABLET (40 MG TOTAL) BY MOUTH DAILY.  Marland Kitchen levETIRAcetam (KEPPRA) 500 MG tablet Take 2,500 mg by mouth 2 (two) times daily. 2 TABLETS BY MOUTH IN AM AND 3 TABS IN THE EVENING.  . LORazepam (ATIVAN) 1 MG tablet Take 1 mg by mouth every 6 (six) hours as needed for seizure.   . mirabegron ER (MYRBETRIQ) 50 MG TB24 tablet Take 1 tablet (50 mg total) by mouth daily.  . nitroGLYCERIN (NITROSTAT) 0.4 MG SL tablet PLACE 1 TABLET (0.4 MG TOTAL) UNDER THE TONGUE EVERY 5 (FIVE) MINUTES AS NEEDED FOR CHEST PAIN.  . NON FORMULARY CPAP Machine:  authorized by [facility] in [place]   . nystatin ointment (MYCOSTATIN) Apply 1 application topically 3 (three) times daily.  Marland Kitchen pyridOXINE (VITAMIN B-6) 100 MG tablet Take 100 mg by mouth daily.  . rosuvastatin (CRESTOR) 5 MG tablet TAKE 1 TABLET BY MOUTH THREE TIMES A WEEK AND INCREASE AS TOLERATED  . Tamsulosin HCl (FLOMAX) 0.4 MG CAPS Take 0.4 mg by mouth every evening.  . thiamine 100 MG tablet Take 100 mg by mouth daily.  Marland Kitchen VIMPAT 200 MG TABS tablet Take 200 mg by mouth 2 (two) times daily.  . vitamin B-12 (CYANOCOBALAMIN) 1000 MCG tablet Take 1,000 mcg by mouth daily.     Allergies:   Haloperidol, Rosuvastatin, Simvastatin, Lisinopril, Nifedipine, and Pravastatin   Social History   Socioeconomic History  . Marital status: Married    Spouse name: Not on file  . Number of children: 1  . Years of education: Not on file  . Highest education level: Not on file  Occupational History  . Occupation: retired/ Education officer, community  Tobacco Use  . Smoking status: Former Smoker    Packs/day: 0.00    Years: 30.00    Pack years: 0.00    Types: Pipe    Quit date: 07/04/2008    Years since quitting: 12.3  . Smokeless tobacco: Former Engineer, maintenance (IT)  . Vaping Use: Never used  Substance and Sexual Activity  . Alcohol use: Yes    Alcohol/week: 0.0 standard drinks    Comment: Very Infrequently   . Drug use: No  . Sexual activity: Not Currently  Other Topics Concern  . Not on file  Social History Narrative  . Not on file   Social Determinants of Health   Financial Resource Strain: Not on file  Food Insecurity: Not on file  Transportation Needs: Not on file  Physical Activity: Not on file  Stress: Not on file  Social Connections: Not on file     Family History:  The patient's family history includes COPD in his father; Coronary artery disease in his father; Dementia (age of onset: 5) in his sister; Diabetes in his brother and sister; Hepatitis in his mother. There is no history of Colon cancer, Esophageal cancer, Rectal cancer, Stomach cancer, or Colon polyps.  ROS:   Please see the history of present illness.  All other systems are reviewed and otherwise negative.    EKGs/Labs/Other Studies Reviewed:    Studies reviewed are outlined and summarized above. Reports included below if pertinent.  Cardiac 01/2018 Conclusion    The left ventricular systolic function is normal.  LV end diastolic pressure is normal.  The left ventricular ejection fraction is 55-65% by visual estimate.   1. Minimal nonobstructive CAD 2. Normal LV function 3. Normal LVEDP  Plan: medical management.   Addendum: during recovery patient developed Second degree AV block Mobitz type 1 with HR into the 40s. He is asymptomatic. No prior history of dizziness or syncope. On no rate slowing medication. Baseline Ecg with very long PR interval. He is instructed to notify us if he develops symptoms. May need Holter monitor as outpatient.     2D Echo 01/2016  Study Conclusions   - Left ventricle: The cavity size was normal. There was moderate  concentric hypertrophy. Systolic function was normal. The  estimated ejection  fraction was in the range of 60% to 65%. Wall  motion was normal; there were no regional wall motion  abnormalities. Features are consistent with a pseudonormal left  ventricular filling  pattern, with concomitant abnormal relaxation  and increased filling pressure (grade 2 diastolic dysfunction).  - Aortic valve: There was mild regurgitation.   Transthoracic echocardiography. M-mode, complete 2D, spectral  Doppler, and color Doppler. Birthdate: Patient birthdate:  1945/08/17. Age: Patient is 75 yr old. Sex: Gender: male.  BMI: 38.9 kg/m^2. Blood pressure:   130/52 Patient status:  Inpatient. Study date: Study date: 01/06/2016. Study time: 09:27  AM. Location: Bedside.     EKG:  EKG is ordered today, personally reviewed, demonstrating appears to be AV pacing with occasional PVCs  Recent Labs: 12/30/2019: ALT 44; BUN 12; Creatinine, Ser 0.94; Hemoglobin 15.2; Magnesium 1.9; Platelets 161.0; Potassium 4.1; Sodium 137; TSH 1.80  Recent Lipid Panel    Component Value Date/Time   CHOL 150 03/18/2019 1011   CHOL 159 03/27/2013 0818   TRIG 146 03/18/2019 1011   TRIG 127 03/27/2013 0818   HDL 46 03/18/2019 1011   HDL 45 03/27/2013 0818   CHOLHDL 3.3 03/18/2019 1011   CHOLHDL 3 04/18/2018 1023   VLDL 26.2 04/18/2018 1023   LDLCALC 79 03/18/2019 1011   LDLCALC 89 03/27/2013 0818   LDLDIRECT 87.6 09/19/2012 1206    PHYSICAL EXAM:    VS:  BP 110/65   Pulse 77   Ht 5' 8"  (1.727 m)   Wt 247 lb (112 kg)   SpO2 95%   BMI 37.56 kg/m   BMI: Body mass index is 37.56 kg/m.  GEN: Well nourished, well developed obese male in no acute distress HEENT: normocephalic, atraumatic Neck: no JVD, carotid bruits, or masses Cardiac: RRR; no murmurs, rubs, or gallops, trace sockline edema  Respiratory:  clear to auscultation bilaterally, normal work of breathing GI: soft, nontender, nondistended, + BS MS: no deformity or atrophy Skin: warm and dry, no rash Neuro:  Alert  and Oriented x 3, Strength and sensation are intact, follows commands Psych: euthymic mood, full affect  Wt Readings from Last 3 Encounters:  11/09/20 247 lb (112 kg)  10/28/20 247 lb (112 kg)  06/01/20 255 lb 12.8 oz (116 kg)     ASSESSMENT & PLAN:   1. Chronic diastolic CHF - appears at baseline on Lasix. Given h/o orthostasis, would not push diuretic further. Will update labs today. Given chronic cough will check BNP and 2V CXR. See below regarding further recommendations. Relayed 2g sodium restriction, 2L fluid restriction, daily weights with instructions. BP otherwise controlled. He will let us know if he wants to revisit the weight management clinic.  2. Bradycardia s/p PPM - overdue for f/u, last seen 06/2019. We will request appointment with Dr. Earlyne Iba team.  3. Essential HTN - controlled on present regimen.  4. OSA on CPAP - they have questions about his machine being on recall. They prefer to continue to follow with Dr. Radford Pax for this - last seen 04/2019. I have asked our MA to route their question to our sleep apnea team and will also arrange follow-up.  5. H/o atypical chest pain - no accelerating symptoms. Continue to observe for more typical reports. Remains on low dose ASA - may need to consider stopping if he has recurrent falls.  6. Mild AI by echo 2017 - per guidelines, mild AI should be followed every 3-5 years. Discussed clinical surveillance vs updating echo. They would like to pursue echocardiogram. We will arrange.  7. Chronic cough - no hemoptysis or weight loss. Some features sound like chronic sinus issues. He is taking allergy medication. Not always compliant with PPI, so  did advise a trial of taking this or H2 more consistently (I.e. Pepcid, Zantac). We will update his BNP and 2V CXR. Otherwise recommended f/u primary care.  8. PVCs - known for patient. Update CMET, Mg, TSH today.  9. Hyperlipidemia - has not had labs since 2020. We will check CMET and  lipid profile. He is doing his Crestor 3x/week at this point and tolerating well.   Disposition: F/u with EP and Dr. Radford Pax as above; otherwise f/u with me in 6 months (patient/family requested).  Medication Adjustments/Labs and Tests Ordered: Current medicines are reviewed at length with the patient today.  Concerns regarding medicines are outlined above. Medication changes, Labs and Tests ordered today are summarized above and listed in the Patient Instructions accessible in Encounters.   Signed, Charlie Pitter, PA-C  11/09/2020 9:54 AM    [address], [place], [place]  [id number] Phone: [phone]; Fax: [phone]

## 2020-11-09 ENCOUNTER — Encounter: Payer: Self-pay | Admitting: Physician Assistant

## 2020-11-09 ENCOUNTER — Ambulatory Visit
Admission: RE | Admit: 2020-11-09 | Discharge: 2020-11-09 | Disposition: A | Discharge status: Home / Self Care | Payer: Medicare Other | Source: Ambulatory Visit | Attending: Physician Assistant | Admitting: Physician Assistant

## 2020-11-09 ENCOUNTER — Ambulatory Visit (INDEPENDENT_AMBULATORY_CARE_PROVIDER_SITE_OTHER): Payer: Medicare Other | Admitting: Physician Assistant

## 2020-11-09 ENCOUNTER — Other Ambulatory Visit: Payer: Self-pay

## 2020-11-09 VITALS — BP 110/65 | HR 77 | Ht 68.0 in | Wt 247.0 lb

## 2020-11-09 DIAGNOSIS — I493 Ventricular premature depolarization: Secondary | ICD-10-CM

## 2020-11-09 DIAGNOSIS — E785 Hyperlipidemia, unspecified: Secondary | ICD-10-CM | POA: Diagnosis not present

## 2020-11-09 DIAGNOSIS — G4733 Obstructive sleep apnea (adult) (pediatric): Secondary | ICD-10-CM

## 2020-11-09 DIAGNOSIS — R053 Chronic cough: Secondary | ICD-10-CM

## 2020-11-09 DIAGNOSIS — I10 Essential (primary) hypertension: Secondary | ICD-10-CM

## 2020-11-09 DIAGNOSIS — Z9989 Dependence on other enabling machines and devices: Secondary | ICD-10-CM

## 2020-11-09 DIAGNOSIS — I351 Nonrheumatic aortic (valve) insufficiency: Secondary | ICD-10-CM | POA: Diagnosis not present

## 2020-11-09 DIAGNOSIS — R059 Cough, unspecified: Secondary | ICD-10-CM | POA: Diagnosis not present

## 2020-11-09 DIAGNOSIS — R0789 Other chest pain: Secondary | ICD-10-CM

## 2020-11-09 DIAGNOSIS — Z950 Presence of cardiac pacemaker: Secondary | ICD-10-CM

## 2020-11-09 DIAGNOSIS — I5032 Chronic diastolic (congestive) heart failure: Secondary | ICD-10-CM

## 2020-11-09 DIAGNOSIS — R0602 Shortness of breath: Secondary | ICD-10-CM | POA: Diagnosis not present

## 2020-11-09 NOTE — Patient Instructions (Addendum)
Medication Instructions:  Your physician recommends that you continue on your current medications as directed. Please refer to the Current Medication list given to you today.  *If you need a refill on your cardiac medications before your next appointment, please call your pharmacy*   Lab Work: TODAY:  CMET, MAG, LIPID, PRO BNP, CBC, & TSH  If you have labs (blood work) drawn today and your tests are completely normal, you will receive your results only by: Marland Kitchen MyChart Message (if you have MyChart) OR . A paper copy in the mail If you have any lab test that is abnormal or we need to change your treatment, we will call you to review the results.   Testing/Procedures: Your physician has requested that you have an echocardiogram. Echocardiography is a painless test that uses sound waves to create images of your heart. It provides your doctor with information about the size and shape of your heart and how well your heart's chambers and valves are working. This procedure takes approximately one hour. There are no restrictions for this procedure.   A chest x-ray takes a picture of the organs and structures inside the chest, including the heart, lungs, and blood vessels. This test can show several things, including, whether the heart is enlarges; whether fluid is building up in the lungs; and whether pacemaker / defibrillator leads are still in place. Go to [employer], today,  [place], [address] [phone]   Follow-Up: At [facility], you and your health needs are our priority.  As part of our continuing mission to provide you with exceptional heart care, we have created designated Provider Care Teams.  These Care Teams include your primary Cardiologist (physician) and Advanced Practice Providers (APPs -  Physician Assistants and Nurse Practitioners) who all work together to provide you with the care you need, when you need it.  We recommend signing up for the patient portal  called "MyChart".  Sign up information is provided on this After Visit Summary.  MyChart is used to connect with patients for Virtual Visits (Telemedicine).  Patients are able to view lab/test results, encounter notes, upcoming appointments, etc.  Non-urgent messages can be sent to your provider as well.   To learn more about what you can do with MyChart, go to [email or web address].    Your next appointment:   6 month(s)  The format for your next appointment:   In Person  Provider:   [name], PA-C   Other Instructions  For patients with congestive heart failure, we give them these special instructions:  1. Follow a low-salt diet - stick to no more than 2,034m of sodium per day. Watch your fluid intake. In general, you should not be taking in more than 2 liters of fluid per day (this is close to 64 oz of fluid per day). This includes sources of water in foods like soup, coffee, tea, milk, etc. 2. Weigh yourself on the same scale at same time of day and keep a log. 3. Call your doctor: (Anytime you feel any of the following symptoms)  - 3lb weight gain overnight or 5lb within a few days - Shortness of breath, with or without a dry hacking cough  - Swelling in the hands, feet or stomach  - If you have to sleep on extra pillows at night in order to breathe   IT IS IMPORTANT TO LET YOUR DOCTOR KNOW EARLY ON IF YOU ARE HAVING SYMPTOMS SO WE CAN HELP YOU!

## 2020-11-10 ENCOUNTER — Telehealth: Payer: Self-pay | Admitting: Physician Assistant

## 2020-11-10 LAB — CBC
Hematocrit: 42.9 % (ref 37.5–51.0)
Hemoglobin: 14.7 g/dL (ref 13.0–17.7)
MCH: 31.5 pg (ref 26.6–33.0)
MCHC: 34.3 g/dL (ref 31.5–35.7)
MCV: 92 fL (ref 79–97)
Platelets: 167 10*3/uL (ref 150–450)
RBC: 4.66 x10E6/uL (ref 4.14–5.80)
RDW: 12.8 % (ref 11.6–15.4)
WBC: 8.4 10*3/uL (ref 3.4–10.8)

## 2020-11-10 LAB — LIPID PANEL
Chol/HDL Ratio: 2.9 ratio (ref 0.0–5.0)
Cholesterol, Total: 135 mg/dL (ref 100–199)
HDL: 46 mg/dL (ref >39)
LDL Chol Calc (NIH): 73 mg/dL (ref 0–99)
Triglycerides: 81 mg/dL (ref 0–149)
VLDL Cholesterol Cal: 16 mg/dL (ref 5–40)

## 2020-11-10 LAB — COMPREHENSIVE METABOLIC PANEL
ALT: 30 IU/L (ref 0–44)
AST: 21 IU/L (ref 0–40)
Albumin/Globulin Ratio: 1.7 (ref 1.2–2.2)
Albumin: 3.8 g/dL (ref 3.7–4.7)
Alkaline Phosphatase: 58 IU/L (ref 44–121)
BUN/Creatinine Ratio: 13 (ref 10–24)
BUN: 12 mg/dL (ref 8–27)
Bilirubin Total: 0.5 mg/dL (ref 0.0–1.2)
CO2: 27 mmol/L (ref 20–29)
Calcium: 9.3 mg/dL (ref 8.6–10.2)
Chloride: 100 mmol/L (ref 96–106)
Creatinine, Ser: 0.89 mg/dL (ref 0.76–1.27)
Globulin, Total: 2.3 g/dL (ref 1.5–4.5)
Glucose: 127 mg/dL — ABNORMAL HIGH (ref 65–99)
Potassium: 4 mmol/L (ref 3.5–5.2)
Sodium: 138 mmol/L (ref 134–144)
Total Protein: 6.1 g/dL (ref 6.0–8.5)
eGFR: 89 mL/min/{1.73_m2} (ref >59)

## 2020-11-10 LAB — PRO B NATRIURETIC PEPTIDE: NT-Pro BNP: 430 pg/mL (ref 0–486)

## 2020-11-10 LAB — MAGNESIUM: Magnesium: 1.9 mg/dL (ref 1.6–2.3)

## 2020-11-10 LAB — TSH: TSH: 1.24 u[IU]/mL (ref 0.450–4.500)

## 2020-11-10 NOTE — Telephone Encounter (Signed)
-----   Message from Charlie Pitter, PA-C sent at 11/10/2020 10:48 AM EDT ----- CXR without any acute changes. F/u PCP for his cough.

## 2020-11-10 NOTE — Telephone Encounter (Signed)
Pt's wife, [name], [place] on file, has been made aware of pt's lab / cxr results and verbalized understanding.

## 2020-11-10 NOTE — Telephone Encounter (Signed)
Patient's wife is returning call to discuss lab results. 

## 2020-11-11 ENCOUNTER — Encounter: Payer: Self-pay | Admitting: Internal Medicine

## 2020-11-11 ENCOUNTER — Other Ambulatory Visit: Payer: Self-pay

## 2020-11-11 ENCOUNTER — Ambulatory Visit (INDEPENDENT_AMBULATORY_CARE_PROVIDER_SITE_OTHER): Payer: Medicare Other | Admitting: Internal Medicine

## 2020-11-11 ENCOUNTER — Telehealth: Payer: Self-pay | Admitting: Internal Medicine

## 2020-11-11 ENCOUNTER — Ambulatory Visit (HOSPITAL_BASED_OUTPATIENT_CLINIC_OR_DEPARTMENT_OTHER)
Admission: RE | Admit: 2020-11-11 | Discharge: 2020-11-11 | Disposition: A | Discharge status: Home / Self Care | Payer: Medicare Other | Source: Ambulatory Visit | Attending: Internal Medicine | Admitting: Internal Medicine

## 2020-11-11 VITALS — BP 156/90 | HR 74 | Temp 98.4°F

## 2020-11-11 DIAGNOSIS — S0990XA Unspecified injury of head, initial encounter: Secondary | ICD-10-CM | POA: Diagnosis not present

## 2020-11-11 DIAGNOSIS — I10 Essential (primary) hypertension: Secondary | ICD-10-CM

## 2020-11-11 DIAGNOSIS — S0083XA Contusion of other part of head, initial encounter: Secondary | ICD-10-CM | POA: Diagnosis not present

## 2020-11-11 NOTE — Telephone Encounter (Signed)
Patients daughter called and said that the patient fell today and landed face first. She was wondering if it is okay for him to take something to help with a headache. The patient is scheduled for today with PCP at 2:40pm. She can be reached at [phone]. Please advise

## 2020-11-11 NOTE — Progress Notes (Signed)
Subjective:    Patient ID: Jeff Weaver, male    DOB: 1945-10-22, 75 y.o.   MRN: [id number]  HPI The patient is here for an acute visit.   He was in his back yard and tripped over a block step and landed on his hands and head.  His banged his forehead and it bled.  There was no LOC.  This occurred around 1-1:30 pm.  He did have a HA and took tylenol  No HA now.  No change in vision. No L/D.  No Nausea       Medications and allergies reviewed with patient and updated if appropriate.  Patient Active Problem List   Diagnosis Date Noted  . Abrasion of leg with infection, left, initial encounter 10/28/2020  . Seizures ([place])   . Muscle cramping 12/30/2019  . Dizziness 12/30/2019  . Unsteady gait 12/30/2019  . Pacemaker 11/27/2019  . Fatigue 07/09/2019  . SSS (sick sinus syndrome) ([place]) 07/02/2018  . Bilateral shoulder pain 06/14/2018  . Upper airway cough syndrome vs cough variant asthma 03/20/2018  . Acute midline low back pain without sciatica 12/25/2017  . Depression 10/17/2017  . CAD (coronary artery disease), hx of NSTEMI 08/15/2017  . Chest pain 07/17/2017  . SOBOE (shortness of breath on exertion) 06/08/2017  . Bilateral sensorineural hearing loss 06/01/2017  . Nasal turbinate hypertrophy 06/01/2017  . Dysphagia 05/31/2017  . Osteoporosis 01/30/2017  . History of cerebral hemorrhage 12/28/2016  . Seborrheic dermatitis 07/25/2016  . Cognitive communication deficit 06/28/2016  . Hypertensive heart disease 06/28/2016  . Vitamin B 12 deficiency 03/16/2016  . Memory difficulties 03/13/2016  . 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 Orthostatic hypotension 01/05/2016  . Benign prostatic hyperplasia with urinary obstruction 08/04/2014  . Urinary urgency 08/04/2014  . Gastroesophageal reflux disease 07/14/2014  . Polymyalgia rheumatica ([place]) 05/28/2014  . 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 SBE (subacute bacterial endocarditis) prophylaxis candidate 11/24/2013   . Aphasia 07/17/2013  . 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 seizures, not intractable, without status epilepticus ([place]) 07/17/2013  . Subdural hemorrhage ([place]) 07/17/2013  . 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[place]) 07/17/2013  . Pre-diabetes 03/26/2013  . Increased frequency of urination 10/31/2012  . Nocturia 10/31/2012  . History of cerebral aneurysm repair 10/18/2012  . History of stroke without residual deficits 10/18/2012  . Blepharitis of both eyes 09/27/2012  . 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 Seizure disorder ([place]) 07/23/2012  . Entropion 07/10/2012  . Hyperopia with astigmatism 07/10/2012  . Combined senile cataract 04/13/2012  . Presbyopia 04/13/2012  . 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 artery ([place]) 03/01/2012  . Cognitive safety issue 02/20/2012  . Nonruptured cerebral aneurysm 11/23/2011  . Obstructive sleep apnea syndrome 08/09/2011  . Hyperlipidemia 02/03/2009  . HTN (hypertension) 02/03/2009  . Osteoarthritis 02/03/2009  . History of Crohn's disease 04/17/2008    Current Outpatient Medications on File Prior to Visit  Medication Sig Dispense Refill  . acetaminophen (TYLENOL) 325 MG tablet Take 325-650 mg by mouth every 6 (six) hours as needed for headache.    Marland Kitchen amoxicillin (AMOXIL) 500 MG capsule Take 2,000 mg by mouth as directed. Take 2000 mg by mouth 1 hour prior to dental appointments     . aspirin EC 81 MG tablet Take 1 tablet (81 mg total) by mouth daily. 90 tablet 3  . cetirizine (ZYRTEC) 10 MG tablet Take 1 tablet (10 mg total) by mouth daily. 30 tablet 11  . Cholecalciferol (VITAMIN D) 50  MCG (2000 UT) CAPS Take 1 capsule by mouth daily.    . fluticasone (FLONASE) 50 MCG/ACT nasal spray Place 1-2 sprays into both nostrils daily as needed.     . furosemide (LASIX) 40 MG tablet TAKE 1 TABLET (40 MG TOTAL) BY MOUTH DAILY. 90 tablet 2  . levETIRAcetam (KEPPRA) 500 MG tablet Take 2,500 mg by mouth 2 (two) times daily. 2  TABLETS BY MOUTH IN AM AND 3 TABS IN THE EVENING.    . LORazepam (ATIVAN) 1 MG tablet Take 1 mg by mouth every 6 (six) hours as needed for seizure.     . mirabegron ER (MYRBETRIQ) 50 MG TB24 tablet Take 1 tablet (50 mg total) by mouth daily. 90 tablet 1  . nitroGLYCERIN (NITROSTAT) 0.4 MG SL tablet PLACE 1 TABLET (0.4 MG TOTAL) UNDER THE TONGUE EVERY 5 (FIVE) MINUTES AS NEEDED FOR CHEST PAIN. 25 tablet 7  . NON FORMULARY CPAP Machine: authorized by [facility] in [place]     . nystatin ointment (MYCOSTATIN) Apply 1 application topically 3 (three) times daily. 30 g 1  . pyridOXINE (VITAMIN B-6) 100 MG tablet Take 100 mg by mouth daily.    . rosuvastatin (CRESTOR) 5 MG tablet TAKE 1 TABLET BY MOUTH THREE TIMES A WEEK AND INCREASE AS TOLERATED 45 tablet 3  . Tamsulosin HCl (FLOMAX) 0.4 MG CAPS Take 0.4 mg by mouth every evening.    . thiamine 100 MG tablet Take 100 mg by mouth daily.    Marland Kitchen VIMPAT 200 MG TABS tablet Take 200 mg by mouth 2 (two) times daily.    . vitamin B-12 (CYANOCOBALAMIN) 1000 MCG tablet Take 1,000 mcg by mouth daily.    . clonazePAM (KLONOPIN) 1 MG disintegrating tablet ALLOW 1 TABLET TO DISSOLVE IN CHEEK FOR SEIZURES LASTING LONGER THAN 4 MINUTES. OR AFTER 2ND SEIZURE IN 24 HOURS    . omeprazole (PRILOSEC) 40 MG capsule TAKE 1 CAPSULE (40 MG TOTAL) BY MOUTH DAILY. 90 capsule 2   No current facility-administered medications on file prior to visit.    Past Medical History:  Diagnosis Date  . Aortic insufficiency    a. mild 2017 echo.  . Atypical chest pain   . Cerebral aneurysm    a. s/p clipping at [facility] in 8/13 c/b short-term memory loss, cerebral hemorrhage and seizure disorder s/p VP shunt.  . Cerebral hemorrhage ([place])   . Chronic diastolic CHF (congestive heart failure) ([place])   . CVA (cerebral infarction)   . Diverticulosis   . Enteritis (regional)    Dr Olevia Perches  . First degree AV block   . GI bleed   . HTN (hypertension)   . Hyperlipidemia   . Hypertensive heart disease    . IBS (irritable bowel syndrome)   . Mild CAD    Cardiac cath 01/12/18 showed minimal nonobstructive CAD, normal LVEF, normal LVEDP.  Marland Kitchen NSVT (nonsustained ventricular tachycardia) ([place])   . Orthostatic hypotension   . Pacemaker   . Pre-diabetes   . PVC's (premature ventricular contractions)   . 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 Rheumatoid arthritis(714.0)    dxed in [employer] in 1980s  . Second degree AV block, Mobitz type I   . Seizures ([place])    last April 26,2021  . Sinus bradycardia   . Sleep apnea   . SSS (sick sinus syndrome) ([place])    a. s/p PPM 07/02/18.    Past Surgical History:  Procedure Laterality Date  . CHOLECYSTECTOMY N/A 01/07/2016  Procedure: LAPAROSCOPIC CHOLECYSTECTOMY ;  Surgeon: Coralie Keens, MD;  Location: [place];  Service: General;  Laterality: N/A;  . cns shunt  02/23/12  . COLONOSCOPY W/ POLYPECTOMY  1978   negative 2009, Dr Olevia Perches. Due 2014  . CRANIOTOMY  02/02/12   Dr Harvel Ricks, [place] of aneurysm  . 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 HERNIA REPAIR    . LEFT HEART CATH AND CORONARY ANGIOGRAPHY N/A 01/12/2018   Procedure: LEFT HEART CATH AND CORONARY ANGIOGRAPHY;  Surgeon: Martinique, Peter M, MD;  Location: [place] CV LAB;  Service: Cardiovascular;  Laterality: N/A;  . LEFT HEART CATHETERIZATION WITH CORONARY ANGIOGRAM N/A 11/26/2013   Procedure: LEFT HEART CATHETERIZATION WITH CORONARY ANGIOGRAM;  Surgeon: [name], MD;  Location: [facility] CATH LAB;  Service: Cardiovascular;  Laterality: N/A;  . NOSE SURGERY    . PACEMAKER IMPLANT N/A 07/02/2018   Procedure: PACEMAKER IMPLANT;  Surgeon: Constance Haw, MD;  Location: [place] CV LAB;  Service: Cardiovascular;  Laterality: N/A;  . SHOULDER SURGERY  1997  . TONSILLECTOMY AND ADENOIDECTOMY    . VENTRICULOPERITONEAL SHUNT  02-23-12   INSERTION OF RIGHT FRONTAL VENTRICULOPERITONEAL SHUNT WITH A  CODMAN HAKIM PROGRAMMABLE VALVE    Social History   Socioeconomic History  . Marital status: Married    Spouse name: Not on file  . Number of children: 1  . Years of education: Not on file  . Highest education level: Not on file  Occupational History  . Occupation: retired/ Education officer, community  Tobacco Use  . Smoking status: Former Smoker    Packs/day: 0.00    Years: 30.00    Pack years: 0.00    Types: Pipe    Quit date: 07/04/2008    Years since quitting: 12.3  . Smokeless tobacco: Former Network engineer  . Vaping Use: Never used  Substance and Sexual Activity  . Alcohol use: Yes    Alcohol/week: 0.0 standard drinks    Comment: Very Infrequently   . Drug use: No  . Sexual activity: Not Currently  Other Topics Concern  . Not on file  Social History Narrative  . Not on file   Social Determinants of Health   Financial Resource Strain: Not on file  Food Insecurity: Not on file  Transportation Needs: Not on file  Physical Activity: Not on file  Stress: Not on file  Social Connections: Not on file    Family History  Problem Relation Age of Onset  . COPD Father   . Coronary artery disease Father        MI in 32s  . Hepatitis Mother   . Diabetes Sister   . Dementia Sister 84  . Diabetes Brother   . Colon cancer Neg Hx   . Esophageal cancer Neg Hx   . Rectal cancer Neg Hx   . Stomach cancer Neg Hx   . Colon polyps Neg Hx     Review of Systems     Objective:   Vitals:   11/11/20 1457  BP: (!) 156/90  Pulse: 74  Temp: 98.4 F (36.9 C)  SpO2: 97%   BP Readings from Last 3 Encounters:  11/11/20 (!) 156/90  11/09/20 110/65  10/28/20 128/76   Wt Readings from Last 3 Encounters:  11/09/20 247 lb (112 kg)  10/28/20 247 lb (112 kg)  06/01/20 255 lb 12.8 oz (116 kg)   There is no height or weight on  file to calculate BMI.   Physical Exam Constitutional:      General: He is not in acute distress.    Appearance: Normal appearance. He is not ill-appearing.   HENT:     Head: Normocephalic.     Comments: Two mild abrasions on central forehead, no lacerations, skin tears, no active bleeding or discharge.  Mild swelling lower frontal forehead.  Scratch on nasal bridge Skin:    General: Skin is warm and dry.  Neurological:     General: No focal deficit present.     Mental Status: He is alert and oriented to person, place, and time.     Cranial Nerves: No cranial nerve deficit.     Sensory: No sensory deficit.     Motor: No weakness.  Psychiatric:        Mood and Affect: Mood normal.       CT Head Wo Contrast CLINICAL DATA:  Fall with forehead hematoma  EXAM: CT HEAD WITHOUT CONTRAST  TECHNIQUE: Contiguous axial images were obtained from the base of the skull through the vertex without intravenous contrast.  COMPARISON:  CT brain 01/05/2016, 09/02/2020, 12/31/2016  FINDINGS: Brain: No acute territorial infarction, hemorrhage, or intracranial mass. Stable positioning of left transfrontal shunt catheter with tip terminating at the anterior aspect of third ventricle. Encephalomalacia within the left frontal and anterior temporal lobe. Mild atrophy. Stable ventricle size with mild ex vacuo dilatation of left frontal horn. Cerebellar atrophic changes as before.  Vascular: Status post aneurysm clipping in the terminal ICA region.  Skull: Left frontotemporal craniotomy changes.  No fracture  Sinuses/Orbits: No acute finding.  Other: None  IMPRESSION: 1. No CT evidence for acute intracranial abnormality. 2. Left anterior craniotomy changes with encephalomalacia involving the left frontal and temporal lobes. Stable positioning of left anterior shunt catheter with stable size and configuration of ventricles.  Electronically Signed   By: Donavan Foil M.D.   On: 11/11/2020 16:47       Assessment & Plan:    See Problem List for Assessment and Plan of chronic medical problems.    This visit occurred during the SARS-CoV-2  public health emergency.  Safety protocols were in place, including screening questions prior to the visit, additional usage of staff PPE, and extensive cleaning of exam room while observing appropriate contact time as indicated for disinfecting solutions.

## 2020-11-11 NOTE — Telephone Encounter (Signed)
Spoke with [name] today. [place] for patient to take Tylenol.

## 2020-11-11 NOTE — Assessment & Plan Note (Signed)
Acute Hit head on cement block, no LOC Not on a/c, but hit risk for complications, seizures Will get Ct of head today  - no bleed, fracture Tylenol prn Call if new symptoms develop Discussed local wound care

## 2020-11-11 NOTE — Assessment & Plan Note (Signed)
Chronic BP elevated here today - usually well controlled  No change in medications Continue lasix 40 mg daily

## 2020-11-11 NOTE — Patient Instructions (Addendum)
A Ct scan of your head was ordered.    Take tylenol as needed.

## 2020-11-16 ENCOUNTER — Other Ambulatory Visit (HOSPITAL_BASED_OUTPATIENT_CLINIC_OR_DEPARTMENT_OTHER): Payer: Self-pay

## 2020-11-16 ENCOUNTER — Telehealth: Payer: Self-pay | Admitting: *Deleted

## 2020-11-16 ENCOUNTER — Encounter: Payer: Self-pay | Admitting: Cardiology

## 2020-11-16 ENCOUNTER — Ambulatory Visit (INDEPENDENT_AMBULATORY_CARE_PROVIDER_SITE_OTHER): Payer: Medicare Other | Admitting: Cardiology

## 2020-11-16 ENCOUNTER — Other Ambulatory Visit: Payer: Self-pay

## 2020-11-16 VITALS — BP 132/70 | HR 84 | Ht 68.25 in | Wt 249.2 lb

## 2020-11-16 DIAGNOSIS — G4733 Obstructive sleep apnea (adult) (pediatric): Secondary | ICD-10-CM

## 2020-11-16 DIAGNOSIS — I2510 Atherosclerotic heart disease of native coronary artery without angina pectoris: Secondary | ICD-10-CM

## 2020-11-16 DIAGNOSIS — Z9989 Dependence on other enabling machines and devices: Secondary | ICD-10-CM

## 2020-11-16 DIAGNOSIS — I5032 Chronic diastolic (congestive) heart failure: Secondary | ICD-10-CM

## 2020-11-16 DIAGNOSIS — I10 Essential (primary) hypertension: Secondary | ICD-10-CM

## 2020-11-16 DIAGNOSIS — I25118 Atherosclerotic heart disease of native coronary artery with other forms of angina pectoris: Secondary | ICD-10-CM | POA: Diagnosis not present

## 2020-11-16 DIAGNOSIS — I495 Sick sinus syndrome: Secondary | ICD-10-CM | POA: Diagnosis not present

## 2020-11-16 DIAGNOSIS — E7800 Pure hypercholesterolemia, unspecified: Secondary | ICD-10-CM

## 2020-11-16 MED ORDER — FUROSEMIDE 40 MG PO TABS
ORAL_TABLET | Freq: Every day | ORAL | 3 refills | Status: DC
  Filled 2020-11-16: qty 90, 90d supply, fill #0
  Filled 2021-02-22: qty 90, 90d supply, fill #1
  Filled 2021-06-14: qty 90, 90d supply, fill #2

## 2020-11-16 MED ORDER — ROSUVASTATIN CALCIUM 5 MG PO TABS
ORAL_TABLET | ORAL | 3 refills | Status: DC
Start: 2020-11-16 — End: 2022-08-22
  Filled 2020-11-16: qty 90, fill #0
  Filled 2021-02-01: qty 36, 90d supply, fill #0
  Filled 2021-04-13: qty 36, 90d supply, fill #1

## 2020-11-16 NOTE — Telephone Encounter (Signed)
awaiting direction from patient where to send the order.

## 2020-11-16 NOTE — Progress Notes (Addendum)
Cardiology Office Note:    Date:  11/16/2020   ID:  Jeff Weaver, DOB 07-15-1945, MRN [id number]  PCP:  [name], MD  Cardiologist: Fransico Him, MD Electrophysiologist:  Constance Haw, MD   Referring MD: [name], MD   Reason for visit: 6 months follow-up of CAD, HTN, HLD, CHF  History of Present Illness:    Jeff Weaver is a 75 y.o. male with a hx of  hx of chronic diastolic, obesity and PPM.  He has a hx of OSA and has been on CPAP for some time .    He is here today for followup of his OSA.  He tolerates the FFM mask but would like one with the tubing coming off the top of his head.  He feels the pressure is adequate for the most part.  When he gets up in the am he does not feel rested but has not been using the device much at all.  The nights that he does use the device he pulls it off in his sleep.   He has some mild mouth dryness. He has a lot of drainage in his throat in the am.   He does not think that he snores.  His wife says that he needs a new device and is on the replacement list.  They would like a new prescription for a new device.  He has a Respironics device and wants a ResMed device due to [employer] recall.    He has a hx of HTN, HLD, mild CAD by cath in 2019 with minimal nonobstructive CAD with normal LVF, NSVT, orthostatic hypotension and SSS s/p PPM followed by EP in device clinic.  He is here today for followup of his HTN, HLD, SSS, CHF and CAD and is doing well.  He has had off and on mild CP that is very sporadic and has not changed from his cath.  He says that when he starts to walk he does not get the air he used to and gets SOB going up the stairs.  This is stable since being seen last by [facility],m PA.  He denies any PND, orthopnea,  dizziness, palpitations or syncope. He is compliant with his meds and is tolerating meds with no SE.    Past Medical History:  Diagnosis Date  . Aortic insufficiency    a. mild 2017 echo.  . Atypical chest  pain   . Cerebral aneurysm    a. s/p clipping at [facility] in 8/13 c/b short-term memory loss, cerebral hemorrhage and seizure disorder s/p VP shunt.  . Cerebral hemorrhage ([place])   . Chronic diastolic CHF (congestive heart failure) ([place])   . CVA (cerebral infarction)   . Diverticulosis   . Enteritis (regional)    Dr Olevia Perches  . First degree AV block   . GI bleed   . HTN (hypertension)   . Hyperlipidemia   . Hypertensive heart disease   . IBS (irritable bowel syndrome)   . Mild CAD    Cardiac cath 01/12/18 showed minimal nonobstructive CAD, normal LVEF, normal LVEDP.  Marland Kitchen NSVT (nonsustained ventricular tachycardia) ([place])   . Orthostatic hypotension   . Pacemaker   . Pre-diabetes   . PVC's (premature ventricular contractions)   . 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 Rheumatoid arthritis(714.0)    dxed in [employer] in 1980s  . Second degree AV block, Mobitz type I   . Seizures ([place])  last April 26,2021  . Sinus bradycardia   . Sleep apnea   . SSS (sick sinus syndrome) ([place])    a. s/p PPM 07/02/18.   Past Surgical History:  Procedure Laterality Date  . CHOLECYSTECTOMY N/A 01/07/2016   Procedure: LAPAROSCOPIC CHOLECYSTECTOMY ;  Surgeon: Coralie Keens, MD;  Location: [place];  Service: General;  Laterality: N/A;  . cns shunt  02/23/12  . COLONOSCOPY W/ POLYPECTOMY  1978   negative 2009, Dr Olevia Perches. Due 2014  . CRANIOTOMY  02/02/12   Dr Harvel Ricks, [place] of aneurysm  . 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 HERNIA REPAIR    . LEFT HEART CATH AND CORONARY ANGIOGRAPHY N/A 01/12/2018   Procedure: LEFT HEART CATH AND CORONARY ANGIOGRAPHY;  Surgeon: Martinique, Peter M, MD;  Location: [place] CV LAB;  Service: Cardiovascular;  Laterality: N/A;  . LEFT HEART CATHETERIZATION WITH CORONARY ANGIOGRAM N/A 11/26/2013   Procedure: LEFT HEART CATHETERIZATION WITH CORONARY ANGIOGRAM;  Surgeon: [name],  MD;  Location[facility] CATH LAB;  Service: Cardiovascular;  Laterality: N/A;  . NOSE SURGERY    . PACEMAKER IMPLANT N/A 07/02/2018   Procedure: PACEMAKER IMPLANT;  Surgeon: Constance Haw, MD;  Location: [place] CV LAB;  Service: Cardiovascular;  Laterality: N/A;  . SHOULDER SURGERY  1997  . TONSILLECTOMY AND ADENOIDECTOMY    . VENTRICULOPERITONEAL SHUNT  02-23-12   INSERTION OF RIGHT FRONTAL VENTRICULOPERITONEAL SHUNT WITH A CODMAN HAKIM PROGRAMMABLE VALVE    Current Medications: Current Meds  Medication Sig  . acetaminophen (TYLENOL) 325 MG tablet Take 325-650 mg by mouth every 6 (six) hours as needed for headache.  Marland Kitchen amoxicillin (AMOXIL) 500 MG capsule Take 2,000 mg by mouth as directed. Take 2000 mg by mouth 1 hour prior to dental appointments   . aspirin EC 81 MG tablet Take 1 tablet (81 mg total) by mouth daily.  . cetirizine (ZYRTEC) 10 MG tablet Take 1 tablet (10 mg total) by mouth daily.  . Cholecalciferol (VITAMIN D) 50 MCG (2000 UT) CAPS Take 1 capsule by mouth daily.  . clonazePAM (KLONOPIN) 1 MG disintegrating tablet ALLOW 1 TABLET TO DISSOLVE IN CHEEK FOR SEIZURES LASTING LONGER THAN 4 MINUTES. OR AFTER 2ND SEIZURE IN 24 HOURS  . fluticasone (FLONASE) 50 MCG/ACT nasal spray Place 1-2 sprays into both nostrils daily as needed.   . furosemide (LASIX) 40 MG tablet TAKE 1 TABLET (40 MG TOTAL) BY MOUTH DAILY.  Marland Kitchen levETIRAcetam (KEPPRA) 500 MG tablet Take 2,500 mg by mouth 2 (two) times daily. 2 TABLETS BY MOUTH IN AM AND 3 TABS IN THE EVENING.  . LORazepam (ATIVAN) 1 MG tablet Take 1 mg by mouth every 6 (six) hours as needed for seizure.   . mirabegron ER (MYRBETRIQ) 50 MG TB24 tablet Take 1 tablet (50 mg total) by mouth daily.  . nitroGLYCERIN (NITROSTAT) 0.4 MG SL tablet PLACE 1 TABLET (0.4 MG TOTAL) UNDER THE TONGUE EVERY 5 (FIVE) MINUTES AS NEEDED FOR CHEST PAIN.  . NON FORMULARY CPAP Machine: authorized by [facility] in [place]   . nystatin ointment (MYCOSTATIN) Apply 1 application  topically 3 (three) times daily.  Marland Kitchen omeprazole (PRILOSEC) 40 MG capsule TAKE 1 CAPSULE (40 MG TOTAL) BY MOUTH DAILY.  Marland Kitchen pyridOXINE (VITAMIN B-6) 100 MG tablet Take 100 mg by mouth daily.  . rosuvastatin (CRESTOR) 5 MG tablet TAKE 1 TABLET BY MOUTH THREE TIMES A WEEK AND INCREASE AS TOLERATED  . Tamsulosin  HCl (FLOMAX) 0.4 MG CAPS Take 0.4 mg by mouth every evening.  . thiamine 100 MG tablet Take 100 mg by mouth daily.  Marland Kitchen VIMPAT 200 MG TABS tablet Take 200 mg by mouth 2 (two) times daily.  . vitamin B-12 (CYANOCOBALAMIN) 1000 MCG tablet Take 1,000 mcg by mouth daily.     Allergies:   Haloperidol, Rosuvastatin, Simvastatin, Lisinopril, Nifedipine, and Pravastatin   Social History   Socioeconomic History  . Marital status: Married    Spouse name: Not on file  . Number of children: 1  . Years of education: Not on file  . Highest education level: Not on file  Occupational History  . Occupation: retired/ Education officer, community  Tobacco Use  . Smoking status: Former Smoker    Packs/day: 0.00    Years: 30.00    Pack years: 0.00    Types: Pipe    Quit date: 07/04/2008    Years since quitting: 12.3  . Smokeless tobacco: Former Network engineer  . Vaping Use: Never used  Substance and Sexual Activity  . Alcohol use: Yes    Alcohol/week: 0.0 standard drinks    Comment: Very Infrequently   . Drug use: No  . Sexual activity: Not Currently  Other Topics Concern  . Not on file  Social History Narrative  . Not on file   Social Determinants of Health   Financial Resource Strain: Not on file  Food Insecurity: Not on file  Transportation Needs: Not on file  Physical Activity: Not on file  Stress: Not on file  Social Connections: Not on file     Family History: The patient's family history includes COPD in his father; Coronary artery disease in his father; Dementia (age of onset: 20) in his sister; Diabetes in his brother and sister; Hepatitis in his mother. There is no history of Colon cancer,  Esophageal cancer, Rectal cancer, Stomach cancer, or Colon polyps.  ROS:   Please see the history of present illness.    All other systems reviewed and are negative.  EKGs/Labs/Other Studies Reviewed:    The following studies were reviewed today:  EKG:  EKG is not ordered today.    Recent Labs: 11/09/2020: ALT 30; BUN 12; Creatinine, Ser 0.89; Hemoglobin 14.7; Magnesium 1.9; NT-Pro BNP 430; Platelets 167; Potassium 4.0; Sodium 138; TSH 1.240  Recent Lipid Panel    Component Value Date/Time   CHOL 135 11/09/2020 1011   CHOL 159 03/27/2013 0818   TRIG 81 11/09/2020 1011   TRIG 127 03/27/2013 0818   HDL 46 11/09/2020 1011   HDL 45 03/27/2013 0818   CHOLHDL 2.9 11/09/2020 1011   CHOLHDL 3 04/18/2018 1023   VLDL 26.2 04/18/2018 1023   LDLCALC 73 11/09/2020 1011   LDLCALC 89 03/27/2013 0818   LDLDIRECT 87.6 09/19/2012 1206    Physical Exam:    VS:  BP 132/70   Pulse 84   Ht 5' 8.25" (1.734 m)   Wt 249 lb 3.2 oz (113 kg)   SpO2 96%   BMI 37.61 kg/m     Wt Readings from Last 3 Encounters:  11/16/20 249 lb 3.2 oz (113 kg)  11/09/20 247 lb (112 kg)  10/28/20 247 lb (112 kg)     GEN: Well nourished, well developed in no acute distress HEENT: Normal NECK: No JVD; No carotid bruits LYMPHATICS: No lymphadenopathy CARDIAC:RRR, no murmurs, rubs, gallops RESPIRATORY:  Clear to auscultation without rales, wheezing or rhonchi  ABDOMEN: Soft, non-tender, non-distended MUSCULOSKELETAL:  1+ RLE  edema; No deformity  SKIN: Warm and dry NEUROLOGIC:  Alert and oriented x 3 PSYCHIATRIC:  Normal affect    ASSESSMENT:    1. SSS (sick sinus syndrome) ([place])   2. Chronic diastolic CHF (congestive heart failure) ([place])   3. Primary hypertension   4. OSA on CPAP   5. Coronary artery disease involving native coronary artery of native heart without angina pectoris   6. Pure hypercholesterolemia    PLAN:    In order of problems listed above:  Junctional bradycardia/SSS -s/p  Medtronic dual-chamber pacemaker implanted 07/03/2018 -this is followed by EP in device clinic  Chronic diastolic heart failure -he has some mild LE edema on exam and admits to salting his food -I have encouraged him to follow a < 2gm Na diet -continue prescription drug management with lasix 102m daily with PRN refills -outside labs from PCP reviewed and interpreted and showed SCr of 0.89 and K+ 4 -he is complaining of continued DOE so I will update a 2D echo to make sure LVF is still normal  Hypertension -his BP is adequately controlled on exam today -continue prescription drug management with diuretics  OSA  -The patient is tolerating PAP therapy well without any problems.   -The patient has been using and benefiting from PAP use and will continue to benefit from therapy.  -I will get a download from his DME since he is not in our [facility] -he wants a new device so I will order a new ResMed CPAP along with new FFM with the tubing coming off at the top of his head  ASCAD -he had minimal non obstructive CAD on cath 2019 with normal LVF -he denies any anginal sx -continue ASA and statin  HLD -LDL goal < 70 -LDL was 73 on 11/09/2020 labs reviewed from PCP -continue prescription drug management with Crestor 54m3 times weekly with PRN refills  Medication Adjustments/Labs and Tests Ordered: Current medicines are reviewed at length with the patient today.  Concerns regarding medicines are outlined above.  No orders of the defined types were placed in this encounter.  No orders of the defined types were placed in this encounter.   There are no Patient Instructions on file for this visit.   Signed, TrFransico HimMD  11/16/2020 1:37 PM    [facility]

## 2020-11-16 NOTE — Addendum Note (Signed)
Addended by: Antonieta Iba on: 11/16/2020 01:47 PM   Modules accepted: Orders

## 2020-11-16 NOTE — Telephone Encounter (Signed)
-----   Message from Antonieta Iba, RN sent at 11/16/2020  1:51 PM EDT ----- From Dr. Radford Pax: -he wants a new device so I will order a new ResMed CPAP along with new FFM with the tubing coming off at the top of his head

## 2020-11-16 NOTE — Patient Instructions (Signed)
Medication Instructions:  Your physician recommends that you continue on your current medications as directed. Please refer to the Current Medication list given to you today.  *If you need a refill on your cardiac medications before your next appointment, please call your pharmacy*  Testing/Procedures: Your physician has requested that you have an echocardiogram. Echocardiography is a painless test that uses sound waves to create images of your heart. It provides your doctor with information about the size and shape of your heart and how well your heart's chambers and valves are working. This procedure takes approximately one hour. There are no restrictions for this procedure.  Follow-Up: At [facility], you and your health needs are our priority.  As part of our continuing mission to provide you with exceptional heart care, we have created designated Provider Care Teams.  These Care Teams include your primary Cardiologist (physician) and Advanced Practice Providers (APPs -  Physician Assistants and Nurse Practitioners) who all work together to provide you with the care you need, when you need it.  Your next appointment:   6 week(s) after receiving your new device  The format for your next appointment:   In Person  Provider:   Fransico Him, MD

## 2020-11-17 ENCOUNTER — Other Ambulatory Visit: Payer: Self-pay

## 2020-11-18 ENCOUNTER — Other Ambulatory Visit (HOSPITAL_BASED_OUTPATIENT_CLINIC_OR_DEPARTMENT_OTHER): Payer: Self-pay

## 2020-11-18 ENCOUNTER — Telehealth: Payer: Self-pay | Admitting: *Deleted

## 2020-11-18 NOTE — Telephone Encounter (Signed)
-----   Message from Antonieta Iba, RN sent at 11/16/2020  1:51 PM EDT ----- From Dr. Radford Pax: -he wants a new device so I will order a new ResMed CPAP along with new FFM with the tubing coming off at the top of his head

## 2020-11-18 NOTE — Telephone Encounter (Signed)
Upon patient request DME selection is [facility]. Patient understands he will be contacted by [facility] to set up his cpap. Patient understands to call if [facility] does not contact him with new setup in a timely manner. Patient understands they will be called once confirmation has been received from [facility] that they have received their new machine to schedule 10 week follow up appointment.   [place] notified of new cpap order  Please add to airview Patient was grateful for the call and thanked me

## 2020-11-18 NOTE — Telephone Encounter (Signed)
Order placed to [facility] to order a new ResMed CPAP along with new FFM with the tubing coming off at the top of his head         From Dr. Radford Pax: -he wants a new device so I will order a new ResMed CPAP along with new FFM with the tubing coming off at the top of his head

## 2020-11-18 NOTE — Telephone Encounter (Signed)
Order placed to [facility] to order a new ResMed CPAP along with new FFM with the tubing coming off at the top of his head

## 2020-11-18 NOTE — Addendum Note (Signed)
Addended by: Freada Bergeron on: 11/18/2020 05:06 PM   Modules accepted: Level of Service, SmartSet

## 2020-11-18 NOTE — Telephone Encounter (Signed)
This encounter was created in error - please disregard.

## 2020-11-19 ENCOUNTER — Encounter: Payer: Self-pay | Admitting: Cardiology

## 2020-11-19 ENCOUNTER — Other Ambulatory Visit (HOSPITAL_BASED_OUTPATIENT_CLINIC_OR_DEPARTMENT_OTHER): Payer: Self-pay

## 2020-11-19 ENCOUNTER — Other Ambulatory Visit: Payer: Self-pay

## 2020-11-19 ENCOUNTER — Other Ambulatory Visit: Payer: Self-pay | Admitting: Internal Medicine

## 2020-11-19 ENCOUNTER — Ambulatory Visit (INDEPENDENT_AMBULATORY_CARE_PROVIDER_SITE_OTHER): Payer: Medicare Other | Admitting: Cardiology

## 2020-11-19 VITALS — BP 114/70 | HR 68 | Ht 68.25 in | Wt 248.6 lb

## 2020-11-19 DIAGNOSIS — I25118 Atherosclerotic heart disease of native coronary artery with other forms of angina pectoris: Secondary | ICD-10-CM

## 2020-11-19 DIAGNOSIS — I5032 Chronic diastolic (congestive) heart failure: Secondary | ICD-10-CM

## 2020-11-19 DIAGNOSIS — I495 Sick sinus syndrome: Secondary | ICD-10-CM | POA: Diagnosis not present

## 2020-11-19 DIAGNOSIS — I2510 Atherosclerotic heart disease of native coronary artery without angina pectoris: Secondary | ICD-10-CM | POA: Diagnosis not present

## 2020-11-19 DIAGNOSIS — Z01810 Encounter for preprocedural cardiovascular examination: Secondary | ICD-10-CM | POA: Diagnosis not present

## 2020-11-19 DIAGNOSIS — I10 Essential (primary) hypertension: Secondary | ICD-10-CM

## 2020-11-19 DIAGNOSIS — K219 Gastro-esophageal reflux disease without esophagitis: Secondary | ICD-10-CM

## 2020-11-19 NOTE — Progress Notes (Signed)
Electrophysiology Office Note   Date:  11/19/2020   ID:  DAJOHN ELLENDER, DOB 07-04-1946, MRN [id number]  PCP:  [name], MD  Cardiologist:  [name] Primary Electrophysiologist:  Tranice Laduke Meredith Leeds, MD    No chief complaint on file.    History of Present Illness: Jeff Weaver is a 75 y.o. male who is being seen today for the evaluation of bradycardia at the request of [name]. Presenting today for electrophysiology evaluation.    He has a history of obesity, orthostasis, hyperlipidemia, chronic diastolic heart failure/hypertensive heart disease, prediabetes, cerebral aneurysm status post clipping, seizure disorder post VP shunt, sleep apnea, and mild aortic insufficiency.  He had been having dyspnea on exertion.  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  He is status post Medtronic dual-chamber pacemaker implanted 07/03/2018.  Today, denies symptoms of palpitations, chest pain, shortness of breath, orthopnea, PND, lower extremity edema, claudication, dizziness, presyncope, syncope, bleeding, or neurologic sequela. The patient is tolerating medications without difficulties.  Since being seen he has done well.  He has noted shortness of breath and no obvious arrhythmias.  He does state that he has a crumbling sensation in his chest.  He did have a left heart catheterization in 2019 with minimal coronary artery disease.  He also has a VP shunt, and the pain is following the tract of the shunt.  Aside from that he is doing well.  Past Medical History:  Diagnosis Date  . Aortic insufficiency    a. mild 2017 echo.  . Atypical chest pain   . Cerebral aneurysm    a. s/p clipping at [facility] in 8/13 c/b short-term memory loss, cerebral hemorrhage and seizure disorder s/p VP shunt.  . Cerebral hemorrhage ([place])   . Chronic diastolic CHF (congestive heart failure) ([place])   . CVA (cerebral infarction)   . Diverticulosis   . Enteritis (regional)    Dr Olevia Perches  . First degree AV block    . GI bleed   . HTN (hypertension)   . Hyperlipidemia   . Hypertensive heart disease   . IBS (irritable bowel syndrome)   . Mild CAD    Cardiac cath 01/12/18 showed minimal nonobstructive CAD, normal LVEF, normal LVEDP.  Marland Kitchen NSVT (nonsustained ventricular tachycardia) ([place])   . Orthostatic hypotension   . Pacemaker   . Pre-diabetes   . PVC's (premature ventricular contractions)   . 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 Rheumatoid arthritis(714.0)    dxed in [employer] in 1980s  . Second degree AV block, Mobitz type I   . Seizures ([place])    last April 26,2021  . Sinus bradycardia   . Sleep apnea   . SSS (sick sinus syndrome) ([place])    a. s/p PPM 07/02/18.   Past Surgical History:  Procedure Laterality Date  . CHOLECYSTECTOMY N/A 01/07/2016   Procedure: LAPAROSCOPIC CHOLECYSTECTOMY ;  Surgeon: Coralie Keens, MD;  Location: [place];  Service: General;  Laterality: N/A;  . cns shunt  02/23/12  . COLONOSCOPY W/ POLYPECTOMY  1978   negative 2009, Dr Olevia Perches. Due 2014  . CRANIOTOMY  02/02/12   Dr Harvel Ricks, [place] of aneurysm  . 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 HERNIA REPAIR    . LEFT HEART CATH AND CORONARY ANGIOGRAPHY N/A 01/12/2018   Procedure: LEFT HEART CATH AND CORONARY ANGIOGRAPHY;  Surgeon: Martinique, Peter M, MD;  Location: [facility] INVASIVE CV  LAB;  Service: Cardiovascular;  Laterality: N/A;  . LEFT HEART CATHETERIZATION WITH CORONARY ANGIOGRAM N/A 11/26/2013   Procedure: LEFT HEART CATHETERIZATION WITH CORONARY ANGIOGRAM;  Surgeon: [name], MD;  Location: [facility] CATH LAB;  Service: Cardiovascular;  Laterality: N/A;  . NOSE SURGERY    . PACEMAKER IMPLANT N/A 07/02/2018   Procedure: PACEMAKER IMPLANT;  Surgeon: Constance Haw, MD;  Location: [place] CV LAB;  Service: Cardiovascular;  Laterality: N/A;  . SHOULDER SURGERY  1997  . TONSILLECTOMY AND ADENOIDECTOMY    .  VENTRICULOPERITONEAL SHUNT  02-23-12   INSERTION OF RIGHT FRONTAL VENTRICULOPERITONEAL SHUNT WITH A CODMAN HAKIM PROGRAMMABLE VALVE     Current Outpatient Medications  Medication Sig Dispense Refill  . acetaminophen (TYLENOL) 325 MG tablet Take 325-650 mg by mouth every 6 (six) hours as needed for headache.    Marland Kitchen amoxicillin (AMOXIL) 500 MG capsule Take 2,000 mg by mouth as directed. Take 2000 mg by mouth 1 hour prior to dental appointments     . aspirin EC 81 MG tablet Take 1 tablet (81 mg total) by mouth daily. 90 tablet 3  . cetirizine (ZYRTEC) 10 MG tablet Take 1 tablet (10 mg total) by mouth daily. 30 tablet 11  . Cholecalciferol (VITAMIN D) 50 MCG (2000 UT) CAPS Take 1 capsule by mouth daily.    . fluticasone (FLONASE) 50 MCG/ACT nasal spray Place 1-2 sprays into both nostrils daily as needed.     . furosemide (LASIX) 40 MG tablet TAKE 1 TABLET (40 MG TOTAL) BY MOUTH DAILY. 90 tablet 3  . levETIRAcetam (KEPPRA) 500 MG tablet Take 2,500 mg by mouth 2 (two) times daily. 2 TABLETS BY MOUTH IN AM AND 3 TABS IN THE EVENING.    . LORazepam (ATIVAN) 1 MG tablet Take 1 mg by mouth every 6 (six) hours as needed for seizure.     . mirabegron ER (MYRBETRIQ) 50 MG TB24 tablet Take 1 tablet (50 mg total) by mouth daily. 90 tablet 1  . nitroGLYCERIN (NITROSTAT) 0.4 MG SL tablet PLACE 1 TABLET (0.4 MG TOTAL) UNDER THE TONGUE EVERY 5 (FIVE) MINUTES AS NEEDED FOR CHEST PAIN. 25 tablet 7  . NON FORMULARY CPAP Machine: authorized by [facility] in [place]     . nystatin ointment (MYCOSTATIN) Apply 1 application topically 3 (three) times daily. 30 g 1  . pyridOXINE (VITAMIN B-6) 100 MG tablet Take 100 mg by mouth daily.    . rosuvastatin (CRESTOR) 5 MG tablet TAKE 1 TABLET BY MOUTH THREE TIMES A WEEK AND INCREASE AS TOLERATED 90 tablet 3  . Tamsulosin HCl (FLOMAX) 0.4 MG CAPS Take 0.4 mg by mouth every evening.    . thiamine 100 MG tablet Take 100 mg by mouth daily.    Marland Kitchen VIMPAT 200 MG TABS tablet Take 200 mg by mouth  2 (two) times daily.    . vitamin B-12 (CYANOCOBALAMIN) 1000 MCG tablet Take 1,000 mcg by mouth daily.    . clonazePAM (KLONOPIN) 1 MG disintegrating tablet ALLOW 1 TABLET TO DISSOLVE IN CHEEK FOR SEIZURES LASTING LONGER THAN 4 MINUTES. OR AFTER 2ND SEIZURE IN 24 HOURS    . omeprazole (PRILOSEC) 40 MG capsule TAKE 1 CAPSULE (40 MG TOTAL) BY MOUTH DAILY. 90 capsule 2   No current facility-administered medications for this visit.    Allergies:   Haloperidol, Rosuvastatin, Simvastatin, Lisinopril, Nifedipine, and Pravastatin   Social History:  The patient  reports that he quit smoking about 12 years ago. His smoking use included  pipe. He smoked 0.00 packs per day for 30.00 years. He has quit using smokeless tobacco. He reports current alcohol use. He reports that he does not use drugs.   Family History:  The patient's family history includes COPD in his father; Coronary artery disease in his father; Dementia (age of onset: 62) in his sister; Diabetes in his brother and sister; Hepatitis in his mother.   ROS:  Please see the history of present illness.   Otherwise, review of systems is positive for none.   All other systems are reviewed and negative.   PHYSICAL EXAM: VS:  BP 114/70   Pulse 68   Ht 5' 8.25" (1.734 m)   Wt 248 lb 9.6 oz (112.8 kg)   SpO2 90%   BMI 37.52 kg/m  , BMI Body mass index is 37.52 kg/m. GEN: Well nourished, well developed, in no acute distress  HEENT: normal  Neck: no JVD, carotid bruits, or masses Cardiac: RRR; no murmurs, rubs, or gallops,no edema  Respiratory:  clear to auscultation bilaterally, normal work of breathing GI: soft, nontender, nondistended, + BS MS: no deformity or atrophy  Skin: warm and dry, device site well healed Neuro:  Strength and sensation are intact Psych: euthymic mood, full affect  EKG:  EKG is ordered today. Personal review of the ekg ordered shows sinus rhythm, ventricular paced, PVC  Personal review of the device interrogation  today. Results in [place]: 11/09/2020: ALT 30; BUN 12; Creatinine, Ser 0.89; Hemoglobin 14.7; Magnesium 1.9; NT-Pro BNP 430; Platelets 167; Potassium 4.0; Sodium 138; TSH 1.240    Lipid Panel     Component Value Date/Time   CHOL 135 11/09/2020 1011   CHOL 159 03/27/2013 0818   TRIG 81 11/09/2020 1011   TRIG 127 03/27/2013 0818   HDL 46 11/09/2020 1011   HDL 45 03/27/2013 0818   CHOLHDL 2.9 11/09/2020 1011   CHOLHDL 3 04/18/2018 1023   VLDL 26.2 04/18/2018 1023   LDLCALC 73 11/09/2020 1011   LDLCALC 89 03/27/2013 0818   LDLDIRECT 87.6 09/19/2012 1206     Wt Readings from Last 3 Encounters:  11/19/20 248 lb 9.6 oz (112.8 kg)  11/16/20 249 lb 3.2 oz (113 kg)  11/09/20 247 lb (112 kg)      Other studies Reviewed: Additional studies/ records that were reviewed today include: LHC 01/12/18  Review of the above records today demonstrates:   The left ventricular systolic function is normal.  LV end diastolic pressure is normal.  The left ventricular ejection fraction is 55-65% by visual estimate.    1. Minimal nonobstructive CAD 2. Normal LV function 3. Normal LVEDP  Holter 02/12/18 - personally reviewed  Profound sinus bradycardia with HR down to 30' even during awake hours. Minimum HR 28 BPM.  1. AVB, frequent non-conducted P waves.  Very frequent PVCs - 7 % of total beats and very short runs of nsVT.  TTE 2017 - Left ventricle: The cavity size was normal. There was moderate   concentric hypertrophy. Systolic function was normal. The   estimated ejection fraction was in the range of 60% to 65%. Wall   motion was normal; there were no regional wall motion   abnormalities. Features are consistent with a pseudonormal left   ventricular filling pattern, with concomitant abnormal relaxation   and increased filling pressure (grade 2 diastolic dysfunction). - Aortic valve: There was mild regurgitation.  ASSESSMENT AND PLAN:  1.  Junctional bradycardia:  Status post Medtronic dual-chamber pacemaker  implanted 05/03/2018.  Device functioning appropriately.  No changes.    2.  Chronic diastolic heart failure: No obvious volume overload.  No changes.  3.  Hypertension: Currently well controlled  4.  Obstructive sleep apnea: CPAP compliance encouraged  Current medicines are reviewed at length with the patient today.   The patient does not have concerns regarding his medicines.  The following changes were made today: none  Labs/ tests ordered today include:  Orders Placed This Encounter  Procedures  . EKG 12-Lead    Disposition:   FU with Dellas Guard 12 months  Signed, Janecia Palau Meredith Leeds, MD  11/19/2020 2:39 PM     [facility] [address] [place] [address] [phone] (office) [phone] (fax)

## 2020-11-19 NOTE — Patient Instructions (Signed)
Medication Instructions:  Your physician recommends that you continue on your current medications as directed. Please refer to the Current Medication list given to you today.  *If you need a refill on your cardiac medications before your next appointment, please call your pharmacy*   Lab Work: none If you have labs (blood work) drawn today and your tests are completely normal, you will receive your results only by: Marland Kitchen MyChart Message (if you have MyChart) OR . A paper copy in the mail If you have any lab test that is abnormal or we need to change your treatment, we will call you to review the results.   Testing/Procedures: none   Follow-Up: At [facility], you and your health needs are our priority.  As part of our continuing mission to provide you with exceptional heart care, we have created designated Provider Care Teams.  These Care Teams include your primary Cardiologist (physician) and Advanced Practice Providers (APPs -  Physician Assistants and Nurse Practitioners) who all work together to provide you with the care you need, when you need it.  We recommend signing up for the patient portal called "MyChart".  Sign up information is provided on this After Visit Summary.  MyChart is used to connect with patients for Virtual Visits (Telemedicine).  Patients are able to view lab/test results, encounter notes, upcoming appointments, etc.  Non-urgent messages can be sent to your provider as well.   To learn more about what you can do with MyChart, go to [email or web address].    Your next appointment:   1 year(s)  The format for your next appointment:   In Person  Provider:   Allegra Lai, MD   Other Instructions

## 2020-11-20 ENCOUNTER — Other Ambulatory Visit (HOSPITAL_BASED_OUTPATIENT_CLINIC_OR_DEPARTMENT_OTHER): Payer: Self-pay

## 2020-11-20 ENCOUNTER — Other Ambulatory Visit (HOSPITAL_COMMUNITY): Payer: Self-pay

## 2020-11-20 ENCOUNTER — Other Ambulatory Visit: Payer: Self-pay | Admitting: Internal Medicine

## 2020-11-20 DIAGNOSIS — I25118 Atherosclerotic heart disease of native coronary artery with other forms of angina pectoris: Secondary | ICD-10-CM

## 2020-11-20 DIAGNOSIS — K219 Gastro-esophageal reflux disease without esophagitis: Secondary | ICD-10-CM

## 2020-11-20 DIAGNOSIS — I10 Essential (primary) hypertension: Secondary | ICD-10-CM

## 2020-11-20 MED FILL — Omeprazole Cap Delayed Release 40 MG: ORAL | 90 days supply | Qty: 90 | Fill #0 | Status: AC

## 2020-11-23 ENCOUNTER — Other Ambulatory Visit (HOSPITAL_BASED_OUTPATIENT_CLINIC_OR_DEPARTMENT_OTHER): Payer: Self-pay

## 2020-11-24 ENCOUNTER — Ambulatory Visit (HOSPITAL_COMMUNITY): Payer: Medicare Other | Attending: Internal Medicine

## 2020-11-24 ENCOUNTER — Ambulatory Visit: Payer: Medicare Other | Admitting: Cardiology

## 2020-11-24 ENCOUNTER — Other Ambulatory Visit (HOSPITAL_BASED_OUTPATIENT_CLINIC_OR_DEPARTMENT_OTHER): Payer: Self-pay

## 2020-11-24 ENCOUNTER — Other Ambulatory Visit: Payer: Self-pay

## 2020-11-24 DIAGNOSIS — R0789 Other chest pain: Secondary | ICD-10-CM | POA: Insufficient documentation

## 2020-11-24 DIAGNOSIS — I351 Nonrheumatic aortic (valve) insufficiency: Secondary | ICD-10-CM | POA: Insufficient documentation

## 2020-11-24 DIAGNOSIS — I493 Ventricular premature depolarization: Secondary | ICD-10-CM | POA: Diagnosis not present

## 2020-11-24 DIAGNOSIS — I5032 Chronic diastolic (congestive) heart failure: Secondary | ICD-10-CM | POA: Diagnosis present

## 2020-11-24 DIAGNOSIS — I10 Essential (primary) hypertension: Secondary | ICD-10-CM | POA: Diagnosis present

## 2020-11-24 DIAGNOSIS — R053 Chronic cough: Secondary | ICD-10-CM | POA: Insufficient documentation

## 2020-11-24 DIAGNOSIS — Z950 Presence of cardiac pacemaker: Secondary | ICD-10-CM | POA: Insufficient documentation

## 2020-11-24 DIAGNOSIS — Z9989 Dependence on other enabling machines and devices: Secondary | ICD-10-CM | POA: Diagnosis not present

## 2020-11-24 DIAGNOSIS — G4733 Obstructive sleep apnea (adult) (pediatric): Secondary | ICD-10-CM | POA: Diagnosis not present

## 2020-11-24 LAB — ECHOCARDIOGRAM COMPLETE
Area-P 1/2: 3.03 cm2
P 1/2 time: 479 msec
S' Lateral: 3.7 cm

## 2020-11-24 MED ORDER — PERFLUTREN LIPID MICROSPHERE
3.0000 mL | INTRAVENOUS | Status: AC | PRN
Start: 2020-11-24 — End: 2020-11-24
  Administered 2020-11-24: 3 mL via INTRAVENOUS

## 2020-11-26 ENCOUNTER — Ambulatory Visit: Payer: Medicare Other | Admitting: Cardiology

## 2020-11-26 ENCOUNTER — Telehealth: Payer: Self-pay | Admitting: Cardiology

## 2020-11-26 ENCOUNTER — Other Ambulatory Visit (HOSPITAL_BASED_OUTPATIENT_CLINIC_OR_DEPARTMENT_OTHER): Payer: Self-pay

## 2020-11-26 DIAGNOSIS — I420 Dilated cardiomyopathy: Secondary | ICD-10-CM

## 2020-11-26 DIAGNOSIS — I5032 Chronic diastolic (congestive) heart failure: Secondary | ICD-10-CM

## 2020-11-26 DIAGNOSIS — I25118 Atherosclerotic heart disease of native coronary artery with other forms of angina pectoris: Secondary | ICD-10-CM

## 2020-11-26 MED ORDER — ENTRESTO 24-26 MG PO TABS
1.0000 | ORAL_TABLET | Freq: Two times a day (BID) | ORAL | 0 refills | Status: DC
  Filled 2020-11-26: qty 60, 30d supply, fill #0

## 2020-11-26 NOTE — Telephone Encounter (Signed)
Patient's wife called to get echo results from dr.

## 2020-11-26 NOTE — Telephone Encounter (Signed)
Shared Decision Making/Informed Consent  The risks [chest pain, shortness of breath, cardiac arrhythmias, dizziness, blood pressure fluctuations, myocardial infarction, stroke/transient ischemic attack, nausea, vomiting, allergic reaction, radiation exposure, metallic taste sensation and life-threatening complications (estimated to be 1 in 10,000)], benefits (risk stratification, diagnosing coronary artery disease, treatment guidance) and alternatives of a nuclear stress test were discussed in detail with Jeff Weaver and he agrees to proceed.

## 2020-11-26 NOTE — Telephone Encounter (Signed)
Sueanne Margarita, MD  11/25/2020 10:15 AM EDT      Echo showed moderately reduced LVF with EF 35% c/w prior MI, but heart function is worse compard to echo 2017, mildly leaky AV and mildly dilated aortic root at 65m. Cath in 2019 showed minimal non obstructive disease. Please repeat Lexiscan myoview to rule out ischemia. 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 Start Entresto 24-224mBID and followup with PharmD in 2 weeks with BMET. IF Bp stable then consider addition of Carvedilol 6.255mID. Followup with me in 6 weeks   The patient's wife has been notified of the result and verbalized understanding.  All questions (if any) were answered.  Lexiscan Myoview has been ordered. Rx for EntDelene Lolls been sent in. PharmD appointment, lab work and FU with Dr. TurRadford Paxve all been scheduled/.

## 2020-11-27 ENCOUNTER — Telehealth: Payer: Self-pay | Admitting: Cardiology

## 2020-11-27 NOTE — Telephone Encounter (Signed)
Spoke to [place] and she wanted to double check that the new Delene Loll is okay with his other medications. Will check with PharmD and call her back.

## 2020-11-27 NOTE — Telephone Encounter (Signed)
[name] advised that Jeff Weaver is okay with other medications. Thankful for the call back. Verbalized understanding.

## 2020-11-27 NOTE — Telephone Encounter (Signed)
Patient's wife is requesting to speak with Dr. Jacolyn Reedy nurse. She states she has additional questions regarding the patient's echo and stress test.

## 2020-12-01 DIAGNOSIS — R42 Dizziness and giddiness: Secondary | ICD-10-CM | POA: Diagnosis not present

## 2020-12-01 DIAGNOSIS — G4733 Obstructive sleep apnea (adult) (pediatric): Secondary | ICD-10-CM | POA: Diagnosis not present

## 2020-12-01 DIAGNOSIS — G9340 Encephalopathy, unspecified: Secondary | ICD-10-CM | POA: Diagnosis not present

## 2020-12-01 DIAGNOSIS — Z87891 Personal history of nicotine dependence: Secondary | ICD-10-CM | POA: Diagnosis not present

## 2020-12-01 DIAGNOSIS — F0150 Vascular dementia without behavioral disturbance: Secondary | ICD-10-CM | POA: Diagnosis not present

## 2020-12-01 DIAGNOSIS — R4189 Other symptoms and signs involving cognitive functions and awareness: Secondary | ICD-10-CM | POA: Diagnosis not present

## 2020-12-01 DIAGNOSIS — R41841 Cognitive communication deficit: Secondary | ICD-10-CM | POA: Diagnosis not present

## 2020-12-01 DIAGNOSIS — I6782 Cerebral ischemia: Secondary | ICD-10-CM | POA: Diagnosis not present

## 2020-12-05 ENCOUNTER — Telehealth: Payer: Self-pay | Admitting: Internal Medicine

## 2020-12-05 NOTE — Telephone Encounter (Signed)
Cardiology Moonlighter Note  Returned page from patient's wife. Patient has complicated medical history including reduced ejection fraction heart failure and seizure disorder with a previously placed VP shunt.  He was started on Entresto several days ago and has been taking this twice daily as prescribed.  Since starting Entresto however, the patient has had symptoms of lightheadedness, extreme tiredness, and difficulty walking.  The patient's wife is concerned that these are secondary to recently starting Entresto.  She has not checked his blood pressure but is concerned that low blood pressures could possibly lower his seizure threshold and cause him to have recurrent seizures.  The patient has had difficulty standing today due to fatigue and lightheadedness.  Currently he is resting and is not having any acute symptoms.  I recommended that the patient discontinue entresto at this time.  He has follow-up with cardiology scheduled for this coming week.  If his symptoms resolve with discontinuation of Entresto, it is okay for him to wait to be seen at his upcoming cardiology appointment.  If however his symptoms persist for greater than the next 24 hours despite being off Entresto.  He should come to the emergency department for evaluation.  I also recommended that the patient's wife obtain a blood pressure cuff and check his blood pressure at least once or twice daily.  She states she will do this.  She understands and agrees with the above plan.  Will copy Dr. Radford Pax on this message.  Marcie Mowers, MD Cardiology Fellow, PGY-8

## 2020-12-07 ENCOUNTER — Telehealth: Payer: Self-pay | Admitting: Cardiology

## 2020-12-07 NOTE — Telephone Encounter (Signed)
Pt c/o medication issue:  1. Name of Medication: sacubitril-valsartan (ENTRESTO) 24-26 MG  2. How are you currently taking this medication (dosage and times per day)? Was 1 tablet twice day   3. Are you having a reaction (difficulty breathing--STAT)? no  4. What is your medication issue? Patient's wife states they called the on call provider Saturday, because the patient was having side effects to the medication. She states he was extremely lightheaded, slept all day and had muscle pains in his legs, knees, and feet. She states he was so tired he laid down on the flow and slept. She states they have checked his BP and when he stands it drops. She states she does not have the readings with her now. She states he was told it was okay to stop the medication until speaking with Dr. Jacolyn Reedy nurse. She states he had only taken 6 pills. She would like a call back from [facility].

## 2020-12-07 NOTE — Telephone Encounter (Signed)
Spoke with the patient's wife who reports that the patient has remained off of Entresto since talking to the on-call provider over the weekend due to [facility] causing lightheadedness, fatigue and muscle weakness. She reports that the patient is feeling much better off of the [facility] and seems to be more like himself. She states that she has been monitoring his Delene Loll but does not have any readings with her. I advised her to continue to monitor his blood pressure and stay off Entresto for now and I will let them know of further recommendations from Dr. Radford Pax. He has follow-up with PharmD 7/15

## 2020-12-08 ENCOUNTER — Telehealth (HOSPITAL_COMMUNITY): Payer: Self-pay | Admitting: *Deleted

## 2020-12-08 DIAGNOSIS — B351 Tinea unguium: Secondary | ICD-10-CM | POA: Diagnosis not present

## 2020-12-08 NOTE — Telephone Encounter (Signed)
Patient's wife given detailed instructions per Myocardial Perfusion Study Information Sheet for the test on 12/11/20 at 10:30. Patient notified to arrive 15 minutes early and that it is imperative to arrive on time for appointment to keep from having the test rescheduled.  If you need to cancel or reschedule your appointment, please call the office within 24 hours of your appointment. . Patient verbalized understanding.Jeff Weaver

## 2020-12-11 ENCOUNTER — Ambulatory Visit (HOSPITAL_COMMUNITY): Payer: Medicare Other | Attending: Cardiology

## 2020-12-11 ENCOUNTER — Other Ambulatory Visit: Payer: Self-pay

## 2020-12-11 DIAGNOSIS — I25118 Atherosclerotic heart disease of native coronary artery with other forms of angina pectoris: Secondary | ICD-10-CM | POA: Diagnosis not present

## 2020-12-11 DIAGNOSIS — I5032 Chronic diastolic (congestive) heart failure: Secondary | ICD-10-CM | POA: Insufficient documentation

## 2020-12-11 MED ORDER — REGADENOSON 0.4 MG/5ML IV SOLN
0.4000 mg | Freq: Once | INTRAVENOUS | Status: AC
  Administered 2020-12-11: 0.4 mg via INTRAVENOUS

## 2020-12-11 MED ORDER — TECHNETIUM TC 99M TETROFOSMIN IV KIT
31.6000 | PACK | Freq: Once | INTRAVENOUS | Status: AC | PRN
  Administered 2020-12-11: 31.6 via INTRAVENOUS
  Filled 2020-12-11: qty 32

## 2020-12-11 MED ORDER — ADENOSINE (DIAGNOSTIC) 3 MG/ML IV SOLN
0.5600 mg/kg | Freq: Once | INTRAVENOUS | Status: AC
  Administered 2020-12-11: 60 mg via INTRAVENOUS

## 2020-12-11 MED ORDER — TECHNETIUM TC 99M TETROFOSMIN IV KIT
29.6000 | PACK | Freq: Once | INTRAVENOUS | Status: AC | PRN
  Administered 2020-12-16: 29.6 via INTRAVENOUS
  Filled 2020-12-11: qty 30

## 2020-12-14 NOTE — Telephone Encounter (Signed)
Pt scheduled to see Dr. Curt Bears this Thursday, 6/16, to further discuss

## 2020-12-16 ENCOUNTER — Other Ambulatory Visit: Payer: Self-pay

## 2020-12-16 ENCOUNTER — Ambulatory Visit (HOSPITAL_COMMUNITY): Payer: Medicare Other

## 2020-12-16 ENCOUNTER — Other Ambulatory Visit: Payer: Medicare Other | Admitting: *Deleted

## 2020-12-16 ENCOUNTER — Ambulatory Visit (INDEPENDENT_AMBULATORY_CARE_PROVIDER_SITE_OTHER): Payer: Medicare Other | Admitting: Pharmacist

## 2020-12-16 VITALS — BP 112/72 | Wt 247.6 lb

## 2020-12-16 DIAGNOSIS — I5032 Chronic diastolic (congestive) heart failure: Secondary | ICD-10-CM

## 2020-12-16 DIAGNOSIS — I2510 Atherosclerotic heart disease of native coronary artery without angina pectoris: Secondary | ICD-10-CM

## 2020-12-16 DIAGNOSIS — I25118 Atherosclerotic heart disease of native coronary artery with other forms of angina pectoris: Secondary | ICD-10-CM

## 2020-12-16 LAB — MYOCARDIAL PERFUSION IMAGING
LV dias vol: 131 mL (ref 62–150)
LV sys vol: 71 mL
Peak HR: 70 {beats}/min
Rest HR: 70 {beats}/min
SDS: 2
SRS: 5
SSS: 6
TID: 1.28

## 2020-12-16 NOTE — Patient Instructions (Addendum)
it was nice meeting you today!  We would like to keep your blood pressure less than 130/80  Please continue your furosemide 85m once a day  Please begin to measure your blood pressure every morning about 2 hours after you take your medications  Please begin to weigh yourself at home and let uKoreaknow if you gain more than 3# overnight or 5# in a week  I will talk with Dr CCurt Bearstomorrow about medication changes  Please call with any questions  CKarren Cobble PharmD, BCACP, CDelaplaine [place] [place] [address]Phone: [phone] Fax: [phone]/15/2022 3:46 PM

## 2020-12-16 NOTE — Progress Notes (Signed)
Patient ID: Jeff Weaver                 DOB: June 17, 1946                      MRN: [id number]     HPI: Jeff Weaver is a 75 y.o. male referred by Dr. Fransico Him to pharmacy clinic for HF medication management. PMH is significant for stroke, seizure disorder, CAD, NSTEMI, stroke, hearing loss, and memory loss. Most recent LVEF 35% on 11/25/20.  Patient was placed on Entresto 24-26 and had approximately 4 doses.  While visiting daughter patient became weak and passed out on floor. Patient does not remember this.  Patient presents today with wife and daughter in good spirits. Daughter Jeff Weaver is taking notes on her computer.  Patient is not a great historian due to memory loss.  He endorses SOB, tiredness, and lower leg swelling.  Currently on 62m furosemide and reports he urinates frequently.  Is not currently on any GDMT but has been on losartan in the past.  Wife and daughter concerned about patient's sodium intake. [place] so he enjoys Asian foods and soy sauces.  Eats processed and canned meats.  Daughter has been trying to take father's blood pressure but is concerned because cuff is too small.  Did not bring log but reports readings were normal to low.  Does not remember pulse readings.  Had BMP drawn today and nuclear stress test.  Has appointment for device check and possible upgrade tomorrow with Dr CCurt Bears  Current CHF meds: furosemide 424mBP goal: <130/80   Wt Readings from Last 3 Encounters:  12/11/20 248 lb (112.5 kg)  11/19/20 248 lb 9.6 oz (112.8 kg)  11/16/20 249 lb 3.2 oz (113 kg)   BP Readings from Last 3 Encounters:  11/19/20 114/70  11/16/20 132/70  11/11/20 (!) 156/90   Pulse Readings from Last 3 Encounters:  11/19/20 68  11/16/20 84  11/11/20 74    Renal function: CrCl cannot be calculated (Patient's most recent lab result is older than the maximum 21 days allowed.).  Past Medical History:   Diagnosis Date   Aortic insufficiency    a. mild 2017 echo.   Atypical chest pain    Cerebral aneurysm    [facility] c/b short-term memory loss, cerebral hemorrhage and seizure disorder s/p VP shunt.   Cerebral hemorrhage (HCC)    Chronic diastolic CHF (congestive heart failure) (HCC)    CVA (cerebral infarction)    Diverticulosis    Enteritis (regional)    Dr BrOlevia Perches First degree AV block    GI bleed    HTN (hypertension)    Hyperlipidemia    Hypertensive heart disease    IBS (irritable bowel syndrome)    Mild CAD    Cardiac cath 01/12/18 showed minimal nonobstructive CAD, normal LVEF, normal LVEDP.   NSVT (nonsustained ventricular tachycardia) (HCC)    Orthostatic hypotension    Pacemaker    Pre-diabetes    PVC's (premature ventricular contractions)    [place]   Rheumatoid arthritis(714.0)    dxed in [employer] in 1980s   Second degree AV block, Mobitz type I    Seizures ([place]   last April 26,2021   Sinus bradycardia    Sleep apnea    SSS (sick sinus syndrome) ([place]  a. s/p PPM 07/02/18.    Current Outpatient Medications on File Prior to Visit  Medication Sig Dispense Refill   acetaminophen (TYLENOL) 325 MG tablet Take 325-650 mg by mouth every 6 (six) hours as needed for headache.     amoxicillin (AMOXIL) 500 MG capsule Take 2,000 mg by mouth as directed. Take 2000 mg by mouth 1 hour prior to dental appointments      aspirin EC 81 MG tablet Take 1 tablet (81 mg total) by mouth daily. 90 tablet 3   cetirizine (ZYRTEC) 10 MG tablet Take 1 tablet (10 mg total) by mouth daily. 30 tablet 11   Cholecalciferol (VITAMIN D) 50 MCG (2000 UT) CAPS Take 1 capsule by mouth daily.     clonazePAM (KLONOPIN) 1 MG disintegrating tablet ALLOW 1 TABLET TO DISSOLVE IN CHEEK FOR SEIZURES LASTING LONGER THAN 4 MINUTES. OR AFTER 2ND SEIZURE IN 24 HOURS     fluticasone (FLONASE) 50 MCG/ACT nasal spray Place 1-2 sprays into  both nostrils daily as needed.      furosemide (LASIX) 40 MG tablet TAKE 1 TABLET (40 MG TOTAL) BY MOUTH DAILY. 90 tablet 3   levETIRAcetam (KEPPRA) 500 MG tablet Take 2,500 mg by mouth 2 (two) times daily. 2 TABLETS BY MOUTH IN AM AND 3 TABS IN THE EVENING.     LORazepam (ATIVAN) 1 MG tablet Take 1 mg by mouth every 6 (six) hours as needed for seizure.      mirabegron ER (MYRBETRIQ) 50 MG TB24 tablet Take 1 tablet (50 mg total) by mouth daily. 90 tablet 1   nitroGLYCERIN (NITROSTAT) 0.4 MG SL tablet PLACE 1 TABLET (0.4 MG TOTAL) UNDER THE TONGUE EVERY 5 (FIVE) MINUTES AS NEEDED FOR CHEST PAIN. 25 tablet 7   NON FORMULARY CPAP Machine: authorized by [facility] in [place]      nystatin ointment (MYCOSTATIN) Apply 1 application topically 3 (three) times daily. 30 g 1   omeprazole (PRILOSEC) 40 MG capsule TAKE 1 CAPSULE (40 MG TOTAL) BY MOUTH DAILY. 90 capsule 2   pyridOXINE (VITAMIN B-6) 100 MG tablet Take 100 mg by mouth daily.     rosuvastatin (CRESTOR) 5 MG tablet TAKE 1 TABLET BY MOUTH THREE TIMES A WEEK AND INCREASE AS TOLERATED 90 tablet 3   sacubitril-valsartan (ENTRESTO) 24-26 MG Take 1 tablet by mouth 2 (two) times daily. 60 tablet 0   Tamsulosin HCl (FLOMAX) 0.4 MG CAPS Take 0.4 mg by mouth every evening.     thiamine 100 MG tablet Take 100 mg by mouth daily.     VIMPAT 200 MG TABS tablet Take 200 mg by mouth 2 (two) times daily.     vitamin B-12 (CYANOCOBALAMIN) 1000 MCG tablet Take 1,000 mcg by mouth daily.     No current facility-administered medications on file prior to visit.    Allergies  Allergen Reactions   Haloperidol Rash and Other (See Comments)    Hallucinations, agitation    Rosuvastatin     Leg pain mainly in knees Other reaction(s): Generalized aches and pains   Simvastatin Nausea And Vomiting    2007: nausea & vomiting ; [facility] ( he does not remember such)   Lisinopril Cough   Nifedipine Swelling    Thought to contribute to lower extremity edema    Pravastatin     Rx[facility]  by [facility]; "myositis" Ok to take per pt's wife/Dr. Linna Darner Other reaction(s): Myositis     Assessment/Plan:  1. CHF -  Patient BP in room 112/72 with  pulse rate of 61.  Patient not currently on any GDMT and could not tolerate Entresto.  Possibly passed out due to hypotension?  Hesistant to start beta blocker or other medication classes due to pulse rate and possibly labile BP.  Will continue furosemide due to patient's LEE.  Could benefit from SGLT2 to help with diuresis and pre DM.  Explained pathophysiology of CHF with patient, wife and daughter.  Explained the role of diet and lifestyle changes to improve cardiac function. Patient's diet very high in sodium. Recommended he reduce total daily sodium intake to <1544m.  Recommended he eat vegetables, proteins, and whole grains.  Recommended begin weighing himself every morning and calling clinic if he gains >3$ overnight or 5# in a week.  Discussed the role of each medication class in HF treatment.  However, patient may currently be symptomatic due to needing BiV upgrade.  Has appointment with Dr CCurt Bearstomorrow. Will discuss with cardiologist and then schedule recheck appointment as needed  Continue furosemide 444mdaily  ChKarren CobblePharmD, BCACP, [id number]. Ch[address]aiian Gardens[place] [address]hone: [phone]: [phone]/15/2022 5:24 PM

## 2020-12-17 ENCOUNTER — Encounter: Payer: Self-pay | Admitting: Cardiology

## 2020-12-17 ENCOUNTER — Ambulatory Visit (INDEPENDENT_AMBULATORY_CARE_PROVIDER_SITE_OTHER): Payer: Medicare Other | Admitting: Cardiology

## 2020-12-17 VITALS — BP 128/68 | HR 87 | Ht 68.5 in | Wt 247.4 lb

## 2020-12-17 DIAGNOSIS — R001 Bradycardia, unspecified: Secondary | ICD-10-CM | POA: Diagnosis not present

## 2020-12-17 DIAGNOSIS — Z01818 Encounter for other preprocedural examination: Secondary | ICD-10-CM | POA: Diagnosis not present

## 2020-12-17 DIAGNOSIS — I2510 Atherosclerotic heart disease of native coronary artery without angina pectoris: Secondary | ICD-10-CM

## 2020-12-17 DIAGNOSIS — Z01812 Encounter for preprocedural laboratory examination: Secondary | ICD-10-CM

## 2020-12-17 LAB — BASIC METABOLIC PANEL
BUN/Creatinine Ratio: 17 (ref 10–24)
BUN: 15 mg/dL (ref 8–27)
CO2: 24 mmol/L (ref 20–29)
Calcium: 9.7 mg/dL (ref 8.6–10.2)
Chloride: 100 mmol/L (ref 96–106)
Creatinine, Ser: 0.87 mg/dL (ref 0.76–1.27)
Glucose: 106 mg/dL — ABNORMAL HIGH (ref 65–99)
Potassium: 4.1 mmol/L (ref 3.5–5.2)
Sodium: 142 mmol/L (ref 134–144)
eGFR: 89 mL/min/{1.73_m2} (ref >59)

## 2020-12-17 NOTE — Progress Notes (Signed)
Electrophysiology Office Note   Date:  12/18/2020   ID:  Jeff Weaver, DOB August 18, 1945, MRN [id number]  PCP:  [name], MD  Cardiologist:  Radford Pax Primary Electrophysiologist:  Daris Aristizabal Meredith Leeds, MD    No chief complaint on file.    History of Present Illness: Jeff Weaver is a 75 y.o. male who is being seen today for the evaluation of bradycardia at the request of [name]. Presenting today for electrophysiology evaluation.    He has a history significant for obesity, orthostasis, hyperlipidemia, prediabetes, cerebral aneurysm post clipping, seizure disorder status post VP shunt, sleep apnea, and aortic insufficiency.  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  Unfortunately he has developed systolic dysfunction potentially due to RV pacing.  Today, denies symptoms of palpitations, chest pain, orthopnea, PND, lower extremity edema, claudication, dizziness, presyncope, syncope, bleeding, or neurologic sequela. The patient is tolerating medications without difficulties.  His main symptoms are shortness of breath and fatigue.  He finds it difficult daily activities due to his level of shortness of breath.  He has been sleeping more than usual as well.  Past Medical History:  Diagnosis Date   Aortic insufficiency    a. mild 2017 echo.   Atypical chest pain    Cerebral aneurysm    a. s/p clipping [facility] in 8/13 c/b short-term memory loss, cerebral hemorrhage and seizure disorder s/p VP shunt.   Cerebral hemorrhage (HCC)    Chronic diastolic CHF (congestive heart failure) (HCC)    CVA (cerebral infarction)    Diverticulosis    Enteritis (regional)    Dr Olevia Perches   First degree AV block    GI bleed    HTN (hypertension)    Hyperlipidemia    Hypertensive heart disease    IBS (irritable bowel syndrome)    Mild CAD    Cardiac cath 01/12/18 showed minimal nonobstructive CAD, normal LVEF, normal  LVEDP.   NSVT (nonsustained ventricular tachycardia) (HCC)    Orthostatic hypotension    Pacemaker    Pre-diabetes    PVC's (premature ventricular contractions)    Regional enteritis of large intestine ([place]) since 1978   Rheumatoid arthritis(714.0)    dxed in [employer] in 1980s   Second degree AV block, Mobitz type I    Seizures ([place])    last April 26,2021   Sinus bradycardia    Sleep apnea    SSS (sick sinus syndrome) ([place])    a. s/p PPM 07/02/18.   Past Surgical History:  Procedure Laterality Date   CHOLECYSTECTOMY N/A 01/07/2016   Procedure: LAPAROSCOPIC CHOLECYSTECTOMY ;  Surgeon: Coralie Keens, MD;  Location: [place];  Service: General;  Laterality: N/A;   cns shunt  02/23/12   COLONOSCOPY W/ POLYPECTOMY  1978   negative 2009, Dr Olevia Perches. Due 2014   CRANIOTOMY  02/02/12   Dr Harvel Ricks, [place] of aneurysm   CRANIOTOMY  02-02-12   left pterional craniotomy for clipping complex anterior communicating artery aneurysm    HERNIA REPAIR     LEFT HEART CATH AND CORONARY ANGIOGRAPHY N/A 01/12/2018   Procedure: LEFT HEART CATH AND CORONARY ANGIOGRAPHY;  Surgeon: Martinique, Peter M, MD;  Location: [place] CV LAB;  Service: Cardiovascular;  Laterality: N/A;   LEFT HEART CATHETERIZATION WITH CORONARY ANGIOGRAM N/A 11/26/2013   Procedure: LEFT HEART CATHETERIZATION WITH CORONARY ANGIOGRAM;  Surgeon: [name], MD;  Location: [facility] CATH LAB;  Service: Cardiovascular;  Laterality: N/A;  NOSE SURGERY     PACEMAKER IMPLANT N/A 07/02/2018   Procedure: PACEMAKER IMPLANT;  Surgeon: Constance Haw, MD;  Location: [place] CV LAB;  Service: Cardiovascular;  Laterality: N/A;   SHOULDER SURGERY  1997   TONSILLECTOMY AND ADENOIDECTOMY     VENTRICULOPERITONEAL SHUNT  02-23-12   INSERTION OF RIGHT FRONTAL VENTRICULOPERITONEAL SHUNT WITH A CODMAN HAKIM PROGRAMMABLE VALVE     Current Outpatient Medications  Medication Sig Dispense Refill   acetaminophen (TYLENOL) 325 MG tablet Take 325-650 mg  by mouth every 6 (six) hours as needed for headache.     amoxicillin (AMOXIL) 500 MG capsule Take 2,000 mg by mouth as directed. Take 2000 mg by mouth 1 hour prior to dental appointments      aspirin EC 81 MG tablet Take 1 tablet (81 mg total) by mouth daily. 90 tablet 3   cetirizine (ZYRTEC) 10 MG tablet Take 1 tablet (10 mg total) by mouth daily. 30 tablet 11   Cholecalciferol (VITAMIN D) 50 MCG (2000 UT) CAPS Take 1 capsule by mouth daily.     clonazePAM (KLONOPIN) 1 MG disintegrating tablet ALLOW 1 TABLET TO DISSOLVE IN CHEEK FOR SEIZURES LASTING LONGER THAN 4 MINUTES. OR AFTER 2ND SEIZURE IN 24 HOURS     fluticasone (FLONASE) 50 MCG/ACT nasal spray Place 1-2 sprays into both nostrils daily as needed.      furosemide (LASIX) 40 MG tablet TAKE 1 TABLET (40 MG TOTAL) BY MOUTH DAILY. 90 tablet 3   levETIRAcetam (KEPPRA) 500 MG tablet Take 2,500 mg by mouth 2 (two) times daily. 2 TABLETS BY MOUTH IN AM AND 3 TABS IN THE EVENING.     LORazepam (ATIVAN) 1 MG tablet Take 1 mg by mouth every 6 (six) hours as needed for seizure.      mirabegron ER (MYRBETRIQ) 50 MG TB24 tablet Take 1 tablet (50 mg total) by mouth daily. 90 tablet 1   nitroGLYCERIN (NITROSTAT) 0.4 MG SL tablet PLACE 1 TABLET (0.4 MG TOTAL) UNDER THE TONGUE EVERY 5 (FIVE) MINUTES AS NEEDED FOR CHEST PAIN. 25 tablet 7   NON FORMULARY CPAP Machine: authorized by [facility] in [place]      nystatin ointment (MYCOSTATIN) Apply 1 application topically 3 (three) times daily. 30 g 1   omeprazole (PRILOSEC) 40 MG capsule TAKE 1 CAPSULE (40 MG TOTAL) BY MOUTH DAILY. 90 capsule 2   pyridOXINE (VITAMIN B-6) 100 MG tablet Take 100 mg by mouth daily.     rosuvastatin (CRESTOR) 5 MG tablet TAKE 1 TABLET BY MOUTH THREE TIMES A WEEK AND INCREASE AS TOLERATED 90 tablet 3   Tamsulosin HCl (FLOMAX) 0.4 MG CAPS Take 0.4 mg by mouth every evening.     thiamine 100 MG tablet Take 100 mg by mouth daily.     VIMPAT 200 MG TABS tablet Take 200 mg by mouth 2 (two) times  daily.     vitamin B-12 (CYANOCOBALAMIN) 1000 MCG tablet Take 1,000 mcg by mouth daily.     No current facility-administered medications for this visit.    Allergies:   Haloperidol, Rosuvastatin, Simvastatin, Lisinopril, Nifedipine, and Pravastatin   Social History:  The patient  reports that he quit smoking about 12 years ago. His smoking use included pipe and cigarettes. He has quit using smokeless tobacco. He reports current alcohol use. He reports that he does not use drugs.   Family History:  The patient's family history includes COPD in his father; Coronary artery disease in his father; Dementia (age of  onset: 3) in his sister; Diabetes in his brother and sister; Hepatitis in his mother.   ROS:  Please see the history of present illness.   Otherwise, review of systems is positive for none.   All other systems are reviewed and negative.   PHYSICAL EXAM: VS:  BP 128/68   Pulse 87   Ht 5' 8.5" (1.74 m)   Wt 247 lb 6.4 oz (112.2 kg)   SpO2 96%   BMI 37.07 kg/m  , BMI Body mass index is 37.07 kg/m. GEN: Well nourished, well developed, in no acute distress  HEENT: normal  Neck: no JVD, carotid bruits, or masses Cardiac: RRR; no murmurs, rubs, or gallops,no edema  Respiratory:  clear to auscultation bilaterally, normal work of breathing GI: soft, nontender, nondistended, + BS MS: no deformity or atrophy  Skin: warm and dry, device site well healed Neuro:  Strength and sensation are intact Psych: euthymic mood, full affect  EKG:  EKG is not ordered today. Personal review of the ekg ordered 11/19/20 shows sinus rhythm, ventricular paced, rate 68, PVCs  Personal review of the device interrogation today. Results in [place]: 11/09/2020: ALT 30; Hemoglobin 14.7; Magnesium 1.9; NT-Pro BNP 430; Platelets 167; TSH 1.240 12/16/2020: BUN 15; Creatinine, Ser 0.87; Potassium 4.1; Sodium 142    Lipid Panel     Component Value Date/Time   CHOL 135 11/09/2020 1011   CHOL 159  03/27/2013 0818   TRIG 81 11/09/2020 1011   TRIG 127 03/27/2013 0818   HDL 46 11/09/2020 1011   HDL 45 03/27/2013 0818   CHOLHDL 2.9 11/09/2020 1011   CHOLHDL 3 04/18/2018 1023   VLDL 26.2 04/18/2018 1023   LDLCALC 73 11/09/2020 1011   LDLCALC 89 03/27/2013 0818   LDLDIRECT 87.6 09/19/2012 1206     Wt Readings from Last 3 Encounters:  12/17/20 247 lb 6.4 oz (112.2 kg)  12/16/20 247 lb 9.6 oz (112.3 kg)  12/11/20 248 lb (112.5 kg)      Other studies Reviewed: Additional studies/ records that were reviewed today include: LHC 01/12/18  Review of the above records today demonstrates:  The left ventricular systolic function is normal. LV end diastolic pressure is normal. The left ventricular ejection fraction is 55-65% by visual estimate.     1. Minimal nonobstructive CAD 2. Normal LV function 3. Normal LVEDP  TTE 11/24/2020  1. Left ventricular ejection fraction, by estimation, is 35% with  hypokinesis of the inferoseptal, inferior , mid/distal anterior, distal  lateral and apical walls . The left ventricle has moderately decreased  function. There is moderate left ventricular   hypertrophy. Left ventricular diastolic parameters are consistent with  Grade I diastolic dysfunction (impaired relaxation). Elevated left atrial  pressure. Compared to echo report from 2017, LVEF is worse   2. Right ventricular systolic function is low normal. The right  ventricular size is normal.   3. The mitral valve is normal in structure. No evidence of mitral valve  regurgitation.   4. The aortic valve is normal in structure. Aortic valve regurgitation is  mild.   5. Aortic dilatation noted. There is mild dilatation of the aortic root,  measuring 41 mm.   Myoview 12/16/2020 The left ventricular ejection fraction is mildly decreased (45-54%). Nuclear stress EF: 46%. There was no ST segment deviation noted during stress. No T wave inversion was noted during stress. The study is  normal. This is an intermediate risk study due to reduced systolic function. There is  no ischemia.  ASSESSMENT AND PLAN:  1.  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  Device functioning appropriately.  2.  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  He Peggy Monk need device upgrade.  Risks and benefits have been discussed.  Risks include bleeding, tamponade, infection, pneumothorax, lead dislodgment.  He understand these risks and has agreed to the procedure.  3.  Hypertension: Currently well controlled  4.  Obstructive sleep apnea: CPAP compliance encouraged  Discussed with primary cardiology  Current medicines are reviewed at length with the patient today.   The patient does not have concerns regarding his medicines.  The following changes were made today: None  Labs/ tests ordered today include:  Orders Placed This Encounter  Procedures   Basic metabolic panel   CBC     Disposition:   FU with Darin Redmann 3 months  Signed, Dresden Lozito Meredith Leeds, MD  12/18/2020 7:20 AM     [address] [place] [address] [phone] (office) [phone] (fax)

## 2020-12-17 NOTE — Patient Instructions (Signed)
Medication Instructions:  Your physician recommends that you continue on your current medications as directed. Please refer to the Current Medication list given to you today.  *If you need a refill on your cardiac medications before your next appointment, please call your pharmacy*   Lab Work: None ordered If you have labs (blood work) drawn today and your tests are completely normal, you will receive your results only by: [place] (if you have MyChart) OR A paper copy in the mail If you have any lab test that is abnormal or we need to change your treatment, we will call you to review the results.   Testing/Procedures: Your physicain has recommended an pacemaker upgrade to a biventricular device.   Follow-Up: At [facility], you and your health needs are our priority.  As part of our continuing mission to provide you with exceptional heart care, we have created designated Provider Care Teams.  These Care Teams include your primary Cardiologist (physician) and Advanced Practice Providers (APPs -  Physician Assistants and Nurse Practitioners) who all work together to provide you with the care you need, when you need it.   Your next appointment:   2 week(s) after pacemaker upgrade  The format for your next appointment:   In Person  Provider:   Allegra Lai, MD    Thank you for choosing [facility]!!   Trinidad Curet, RN [phone]   Other Instructions  Implantable Device Instructions  You are scheduled for: upgrade to a BiVentricvular pacemaker on 01/18/2021 with Dr. Curt Bears.  1.   Pre procedure testing-             A.  LAB WORK--- On 12/29/2020  for your pre procedure blood work.  You do NOT need to be fasting. You can stop by the [facility] on [address]  On the day of your procedure 01/18/2021 you will go to [facility] hospital ([address]) at 5:30 am.  Dennis Bast will go to the main entrance A [employer]) and enter where the [employer] are.  You will check in at ADMITTING.  You may have one support person come in to the hospital with you.  They will be asked to wait in the waiting room.   3.   Do not eat or drink after midnight prior to your procedure.   4.   On the morning of your procedure do NOT take any medication.  5.  The night before your procedure and the morning of your procedure scrub your neck/chest with surgical scrub.  See instruction letter below.   5.  Plan for an overnight stay, but you may be discharged home after your procedure. If you use your phone frequently bring your phone charger, in case you have to stay.  If you are discharged after your procedure you will need someone to drive you home and be with your for 24 hours after your procedure.   6.  You will follow up with the [facility] 10-14 days after your procedure. You will follow up with Dr. Curt Bears 91 days after your procedure.  These appointments will be made for you.  7. FYI: For your safety, and to allow [place] to monitor your vital signs accurately during the surgery/procedure we request that if you have artificial nails, gel coating, SNS etc. Please have those removed prior to your surgery/procedure. Not having the nail coverings /polish removed may result in cancellation or delay of your surgery/procedure.   *  If you have ANY questions after you get home, please call the office [phone] and ask for Zemira Zehring RN or send a MyChart message.    [facility] - Preparing For Surgery  Before surgery, you can play an important role. Because skin is not sterile, your skin needs to be as free of germs as possible. You can reduce the number of germs on your skin by washing with CHG (chlorahexidine gluconate) Soap before surgery.  CHG is an antiseptic cleaner which kills germs and bonds with the skin to continue killing germs even after washing.   Please do not use if you have an allergy to CHG or antibacterial soaps.  If your skin  becomes reddened/irritated stop using the CHG.   Do not shave (including legs and underarms) for at least 48 hours prior to first CHG shower.  It is OK to shave your face.  Please follow these instructions carefully:  1.  Shower the night before surgery and the morning of surgery with CHG.  2.  If you choose to wash your hair, wash your hair first as usual with your normal shampoo.  3.  After you shampoo, rinse your hair and body thoroughly to remove the shampoo.  4.  Use CHG as you would any other liquid soap.  You can apply CHG directly to the skin and wash gently with a clean washcloth. 5.  Apply the CHG Soap to your body ONLY FROM THE NECK DOWN.  Do not use on open wounds or open sores.  Avoid contact with your eyes, ears, mouth and genitals (private parts).  Wash genitals (private parts) with your normal soap.  6.  Wash thoroughly, paying special attention to the area where your surgery will be performed.  7.  Thoroughly rinse your body with warm water from the neck down.   8.  DO NOT shower/wash with your normal soap after using and rinsing off the CHG soap.  9.  Pat yourself dry with a clean towel.           10.  Wear clean pajamas.           11.  Place clean sheets on your bed the night of your first shower and do not sleep with pets.  Day of Surgery: Do not apply any deodorants/lotions.  Please wear clean clothes to the hospital/surgery center.     Biventricular Pacemaker Implantation A biventricular pacemaker implantation is a procedure to place (implant) a pacemaker near the heart. A pacemaker is a small, battery-powered device that helps control the heartbeat. If the heart beats irregularly or too slowly (bradycardia), the pacemaker will pace the heart so that it beats at a normal rate or a programmed rate. The parts of a biventricular pacemaker include: The pulse generator. The pulse generator contains a small computer and a memory system that is programmed to keep the heart  beating at a certain rate. The pulse generator also produces the electrical signal that triggers the heart to beat. This is implanted under the skin of the upper chest, near the collarbone. Wires (leads). There may be two or three leads placed in the heart--one to the right atrium, one to the right ventricle, and one through the coronary sinus to reach the left ventricle of the heart. The leads are connected to the pulse generator. They transmit electrical pulses from the pulse generator to the heart. A biventricular pacemaker is used in people with heart failure due to weak heart muscles. The pacemaker  can help the heart chambers pump more efficiently. This procedure may be done to treat: Symptoms of severe heart failure, such as shortness of breath (dyspnea). Loss of consciousness that happens repeatedly (syncope) because of an irregular heart rate. Tell a health care provider about: Any allergies you have. All medicines you are taking, including vitamins, herbs, eye drops, creams, and over-the-counter medicines. Any problems you or family members have had with anesthetic medicines. Any blood disorders you have. Any surgeries you have had. Any medical conditions you have. Whether you are pregnant or may be pregnant. What are the risks? Generally, this is a safe procedure. However, problems may occur, including: Infection. Swelling, bruising, or bleeding at the pacemaker site, especially if you take blood thinners. Allergic reactions to medicines or dyes. Damage to blood vessels or nerves near the pacemaker. Failure of the pacemaker to improve your condition. Collapsed lung. Blood clots. Lead failures. This may require more surgery. What happens before the procedure? Medicines Ask your health care provider about: Changing or stopping your regular medicines. This is especially important if you are taking diabetes medicines or blood thinners. Taking medicines such as aspirin and ibuprofen.  These medicines can thin your blood. Do not take these medicines unless your health care provider tells you to take them. Taking over-the-counter medicines, vitamins, herbs, and supplements. Eating and drinking Follow instructions from your health care provider about eating and drinking, which may include: 8 hours before the procedure - stop eating heavy meals or foods, such as meat, fried foods, or fatty foods. 6 hours before the procedure - stop eating light meals or foods, such as toast or cereal. 6 hours before the procedure - stop drinking milk or drinks that contain milk. 2 hours before the procedure - stop drinking clear liquids. Staying hydrated Follow instructions from your health care provider about hydration, which may include: Up to 2 hours before the procedure - you may continue to drink clear liquids, such as water, clear fruit juice, black coffee, and plain tea.  General instructions Do not use any products that contain nicotine or tobacco for at least 4 weeks before the procedure. These products include cigarettes, e-cigarettes, and chewing tobacco. If you need help quitting, ask your health care provider. You may have tests, including: Blood tests. Chest X-rays. Echocardiogram. This is a test that uses sound waves (ultrasound) to produce an image of the heart. Ask your health care provider: How your surgery site will be marked. What steps will be taken to help prevent infection. These may include: Removing hair at the surgery site. Washing skin with a germ-killing soap. Taking antibiotic medicine. Plan to have someone take you home from the hospital or clinic. If you will be going home right after the procedure, plan to have someone with you for 24 hours. What happens during the procedure?  An IV will be inserted into one of your veins. You will be connected to a heart monitor. Large electrode pads will be placed on the front and back of your chest. You will be given  one or more of the following: A medicine to help you relax (sedative). A medicine to make you fall asleep (general anesthetic). A medicine that is injected into an area of your body to numb the area (local anesthetic). An incision will be made in your upper chest, near your heart. The leads will be guided into your incision, through your blood vessels, and into your heart. Your health care provider will use an X-ray machine (  fluoroscope) to guide the leads into your heart. The leads will be attached to your heart muscles and to the pulse generator. The leads will be tested to make sure that they work correctly. The pulse generator will be implanted under your skin, near your incision. The heart monitor will be watched to ensure that the pacer is working correctly. Your incision will be closed with stitches (sutures), skin glue, or adhesive tape. A bandage (dressing) will be placed over your incision. The procedure may vary among health care providers and hospitals. What happens after the procedure? Your blood pressure, heart rate, breathing rate, and blood oxygen level will be monitored until you leave the hospital or clinic. You may continue to receive fluids and medicines through an IV. You will be given pain medicine as needed. You will have a chest X-ray done. This is to make sure that your pacemaker is in the right place. You will be given a pacemaker identification card. This card lists the implant date, device model, and manufacturer of your pacemaker. Do not drive for 24 hours if you were given a sedative during your procedure. Summary A pacemaker is a small, battery-powered device that helps control the heartbeat. A biventricular pacemaker is used in people with heart failure due to weak heart muscles. Follow instructions from your health care provider about taking medicines and about eating and drinking before the procedure. You will be given a pacemaker identification card that  lists the implant date, device model, and manufacturer of your pacemaker. This information is not intended to replace advice given to you by your health care provider. Make sure you discuss any questions you have with your healthcare provider. Document Revised: 05/23/2018 Document Reviewed: 05/23/2018 Elsevier Patient Education  2022 [employer].

## 2020-12-28 NOTE — Telephone Encounter (Signed)
Settings  Auto-cpap Mean pressure   11.6 cmH20 Auto-Cpap Peak Average pressure   15.2 cmH20 Average Device Pressure<=90% of time 12.7 cmH20

## 2020-12-29 ENCOUNTER — Other Ambulatory Visit: Payer: Self-pay

## 2020-12-29 DIAGNOSIS — R001 Bradycardia, unspecified: Secondary | ICD-10-CM

## 2020-12-29 DIAGNOSIS — Z01812 Encounter for preprocedural laboratory examination: Secondary | ICD-10-CM

## 2020-12-30 ENCOUNTER — Ambulatory Visit (INDEPENDENT_AMBULATORY_CARE_PROVIDER_SITE_OTHER): Payer: Medicare Other

## 2020-12-30 DIAGNOSIS — I495 Sick sinus syndrome: Secondary | ICD-10-CM

## 2020-12-30 DIAGNOSIS — R413 Other amnesia: Secondary | ICD-10-CM | POA: Diagnosis not present

## 2020-12-30 DIAGNOSIS — F0150 Vascular dementia without behavioral disturbance: Secondary | ICD-10-CM | POA: Diagnosis not present

## 2020-12-30 DIAGNOSIS — G40209 Localization-related (focal) (partial) symptomatic epilepsy and epileptic syndromes with complex partial seizures, not intractable, without status epilepticus: Secondary | ICD-10-CM | POA: Diagnosis not present

## 2020-12-30 LAB — CUP PACEART REMOTE DEVICE CHECK
Battery Remaining Longevity: 101 mo
Battery Voltage: 2.99 V
Brady Statistic AP VP Percent: 78.38 %
Brady Statistic AP VS Percent: 0.25 %
Brady Statistic AS VP Percent: 20.1 %
Brady Statistic AS VS Percent: 1.26 %
Brady Statistic RA Percent Paced: 79.11 %
Brady Statistic RV Percent Paced: 98.49 %
Date Time Interrogation Session: 20220629002412
Implantable Lead Implant Date: 20191230
Implantable Lead Implant Date: 20191230
Implantable Lead Location: 753859
Implantable Lead Location: 753860
Implantable Lead Model: 5076
Implantable Lead Model: 5076
Implantable Pulse Generator Implant Date: 20191230
Lead Channel Impedance Value: 304 Ohm
Lead Channel Impedance Value: 342 Ohm
Lead Channel Impedance Value: 399 Ohm
Lead Channel Impedance Value: 589 Ohm
Lead Channel Pacing Threshold Amplitude: 0.75 V
Lead Channel Pacing Threshold Amplitude: 1.125 V
Lead Channel Pacing Threshold Pulse Width: 0.4 ms
Lead Channel Pacing Threshold Pulse Width: 0.4 ms
Lead Channel Sensing Intrinsic Amplitude: 1.875 mV
Lead Channel Sensing Intrinsic Amplitude: 1.875 mV
Lead Channel Sensing Intrinsic Amplitude: 20.375 mV
Lead Channel Sensing Intrinsic Amplitude: 20.375 mV
Lead Channel Setting Pacing Amplitude: 1.75 V
Lead Channel Setting Pacing Amplitude: 2.5 V
Lead Channel Setting Pacing Pulse Width: 0.4 ms
Lead Channel Setting Sensing Sensitivity: 1.2 mV

## 2020-12-30 LAB — BASIC METABOLIC PANEL
BUN/Creatinine Ratio: 17 (ref 10–24)
BUN: 13 mg/dL (ref 8–27)
CO2: 24 mmol/L (ref 20–29)
Calcium: 9.2 mg/dL (ref 8.6–10.2)
Chloride: 101 mmol/L (ref 96–106)
Creatinine, Ser: 0.77 mg/dL (ref 0.76–1.27)
Glucose: 125 mg/dL — ABNORMAL HIGH (ref 65–99)
Potassium: 4.2 mmol/L (ref 3.5–5.2)
Sodium: 140 mmol/L (ref 134–144)
eGFR: 93 mL/min/{1.73_m2} (ref >59)

## 2020-12-30 LAB — CBC
Hematocrit: 41.9 % (ref 37.5–51.0)
Hemoglobin: 14.7 g/dL (ref 13.0–17.7)
MCH: 31.3 pg (ref 26.6–33.0)
MCHC: 35.1 g/dL (ref 31.5–35.7)
MCV: 89 fL (ref 79–97)
Platelets: 171 10*3/uL (ref 150–450)
RBC: 4.7 x10E6/uL (ref 4.14–5.80)
RDW: 12.4 % (ref 11.6–15.4)
WBC: 7.1 10*3/uL (ref 3.4–10.8)

## 2021-01-02 DIAGNOSIS — R569 Unspecified convulsions: Secondary | ICD-10-CM | POA: Diagnosis not present

## 2021-01-02 DIAGNOSIS — I498 Other specified cardiac arrhythmias: Secondary | ICD-10-CM | POA: Diagnosis not present

## 2021-01-02 DIAGNOSIS — R0902 Hypoxemia: Secondary | ICD-10-CM | POA: Diagnosis not present

## 2021-01-02 DIAGNOSIS — R471 Dysarthria and anarthria: Secondary | ICD-10-CM | POA: Diagnosis not present

## 2021-01-02 DIAGNOSIS — R4781 Slurred speech: Secondary | ICD-10-CM | POA: Diagnosis not present

## 2021-01-02 DIAGNOSIS — G40909 Epilepsy, unspecified, not intractable, without status epilepticus: Secondary | ICD-10-CM | POA: Diagnosis not present

## 2021-01-02 DIAGNOSIS — R410 Disorientation, unspecified: Secondary | ICD-10-CM | POA: Diagnosis not present

## 2021-01-02 DIAGNOSIS — I959 Hypotension, unspecified: Secondary | ICD-10-CM | POA: Diagnosis not present

## 2021-01-02 DIAGNOSIS — R404 Transient alteration of awareness: Secondary | ICD-10-CM | POA: Diagnosis not present

## 2021-01-03 DIAGNOSIS — Z950 Presence of cardiac pacemaker: Secondary | ICD-10-CM | POA: Diagnosis not present

## 2021-01-03 DIAGNOSIS — R55 Syncope and collapse: Secondary | ICD-10-CM | POA: Diagnosis not present

## 2021-01-05 NOTE — Progress Notes (Signed)
Subjective:    Patient ID: Jeff Weaver, male    DOB: [birth year], 75 y.o.   MRN: [id number]  HPI The patient is here for an acute visit.  He fell 4 days ago and had a seizure.  He was found lying in the road in [place] neighborhood.  In the ED he was alert, but confused with slurred speech on EMS arrival.  He was incontinent of urine.  Blood work, EKG, Ct of head. All unremarkable.  Reviewed emergency room note and tests performed.  Since the fall he has had some bruises in different pains coming up they were concerned about.  Right upper arm bruise.  It has been sore with movement - better today.    He has been having pain in the right upper posterior leg - moved up to buttock region.  He also states some pain across his lower back.  Bruise on right knee from recent fall.  Residual bruise on left anterior knee from previous fall.  He states he is walking, but feels less stable and is walking slower.  He is using his cane.   Medications and allergies reviewed with patient and updated if appropriate.  Patient Active Problem List   Diagnosis Date Noted   Head injury 11/11/2020   Abrasion of leg with infection, left, initial encounter 10/28/2020   Seizures ([place])    Muscle cramping 12/30/2019   Dizziness 12/30/2019   Unsteady gait 12/30/2019   Pacemaker 11/27/2019   Fatigue 07/09/2019   SSS (sick sinus syndrome) ([place]) 07/02/2018   Bilateral shoulder pain 06/14/2018   Upper airway cough syndrome vs cough variant asthma 03/20/2018   Acute midline low back pain without sciatica 12/25/2017   Depression 10/17/2017   CAD (coronary artery disease), hx of NSTEMI 08/15/2017   Chest pain 07/17/2017   SOBOE (shortness of breath on exertion) 06/08/2017   Bilateral sensorineural hearing loss 06/01/2017   Nasal turbinate hypertrophy 06/01/2017   Dysphagia 05/31/2017   Osteoporosis 01/30/2017   History of cerebral hemorrhage 12/28/2016   Seborrheic dermatitis 07/25/2016   Cognitive  communication deficit 06/28/2016   Hypertensive heart disease 06/28/2016   Vitamin B 12 deficiency 03/16/2016   Memory difficulties 03/13/2016   VP (ventriculoperitoneal) shunt status 02/12/2016   Orthostatic hypotension 01/05/2016   Benign prostatic hyperplasia with urinary obstruction 08/04/2014   Urinary urgency 08/04/2014   Gastroesophageal reflux disease 07/14/2014   Polymyalgia rheumatica ([place]) 05/28/2014   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/2014   SBE (subacute bacterial endocarditis) prophylaxis candidate 11/24/2013   Aphasia 07/17/2013   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 seizures, not intractable, without status epilepticus ([place]) 07/17/2013   Subdural hemorrhage ([place]) 07/17/2013   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[place]) 07/17/2013   Pre-diabetes 03/26/2013   Increased frequency of urination 10/31/2012   Nocturia 10/31/2012   History of cerebral aneurysm repair 10/18/2012   History of stroke without residual deficits 10/18/2012   Blepharitis of both eyes 09/27/2012   Ocular rosacea 09/27/2012   Seizure disorder ([place]) 07/23/2012   Entropion 07/10/2012   Hyperopia with astigmatism 07/10/2012   Combined senile cataract 04/13/2012   Presbyopia 04/13/2012   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     amoxicillin (AMOXIL) 500 MG capsule Take 2,000 mg by mouth as directed. Take 2000 mg by mouth 1 hour prior to dental appointments      aspirin EC 81 MG tablet Take 1 tablet (81  mg total) by mouth daily. 90 tablet 3   cetirizine (ZYRTEC) 10 MG tablet Take 1 tablet (10 mg total) by mouth daily. 30 tablet 11   Cholecalciferol (VITAMIN D) 50 MCG (2000 UT) CAPS Take 1 capsule by mouth daily.     fluticasone (FLONASE) 50 MCG/ACT nasal spray Place 1-2 sprays into both nostrils daily as needed.      furosemide (LASIX) 40 MG tablet TAKE 1 TABLET (40 MG TOTAL) BY MOUTH DAILY. 90 tablet 3   levETIRAcetam (KEPPRA) 500 MG tablet Take 2,500 mg by mouth 2 (two) times daily. 2 TABLETS BY MOUTH IN AM AND 3 TABS IN THE EVENING.     LORazepam (ATIVAN) 1 MG tablet Take 1 mg by mouth every 6 (six) hours as needed for seizure.      mirabegron ER (MYRBETRIQ) 50 MG TB24 tablet Take 1 tablet (50 mg total) by mouth daily. 90 tablet 1   nitroGLYCERIN (NITROSTAT) 0.4 MG SL tablet PLACE 1 TABLET (0.4 MG TOTAL) UNDER THE TONGUE EVERY 5 (FIVE) MINUTES AS NEEDED FOR CHEST PAIN. 25 tablet 7   NON FORMULARY CPAP Machine: authorized by [facility] in [place]      nystatin ointment (MYCOSTATIN) Apply 1 application topically 3 (three) times daily. 30 g 1   omeprazole (PRILOSEC) 40 MG capsule TAKE 1 CAPSULE (40 MG TOTAL) BY MOUTH DAILY. 90 capsule 2   pyridOXINE (VITAMIN B-6) 100 MG tablet Take 100 mg by mouth daily.     rosuvastatin (CRESTOR) 5 MG tablet TAKE 1 TABLET BY MOUTH THREE TIMES A WEEK AND INCREASE AS TOLERATED 90 tablet 3   Tamsulosin HCl (FLOMAX) 0.4 MG CAPS Take 0.4 mg by mouth every evening.     thiamine 100 MG tablet Take 100 mg by mouth daily.     VIMPAT 200 MG TABS tablet Take 200 mg by mouth 2 (two) times daily.     vitamin B-12 (CYANOCOBALAMIN) 1000 MCG tablet Take 1,000 mcg by mouth daily.     clonazePAM (KLONOPIN) 1 MG disintegrating tablet ALLOW 1 TABLET TO DISSOLVE IN CHEEK FOR SEIZURES LASTING LONGER THAN 4 MINUTES. OR AFTER 2ND SEIZURE IN 24 HOURS     No current facility-administered medications on file prior to visit.    Past Medical History:  Diagnosis Date   Aortic insufficiency     a. mild 2017 echo.   Atypical chest pain    Cerebral aneurysm    a. s/p clipping at [facility] in 8/13 c/b short-term memory loss, cerebral hemorrhage and seizure disorder s/p VP shunt.   Cerebral hemorrhage (HCC)    Chronic diastolic CHF (congestive heart failure) (HCC)    CVA (cerebral infarction)    Diverticulosis    Enteritis (regional)    Dr Olevia Perches   First degree AV block    GI bleed    HTN (hypertension)    Hyperlipidemia    Hypertensive heart disease    IBS (irritable bowel syndrome)    Mild CAD    Cardiac cath 01/12/18 showed minimal nonobstructive CAD, normal LVEF, normal LVEDP.   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 s/p PPM 07/02/18.    Past Surgical History:  Procedure Laterality Date   CHOLECYSTECTOMY N/A 01/07/2016   Procedure: LAPAROSCOPIC CHOLECYSTECTOMY ;  Surgeon: Coralie Keens, MD;  Location: [facility];  Service: General;  Laterality: N/A;   cns shunt  02/23/12   COLONOSCOPY W/ POLYPECTOMY  1978   negative 2009, Dr Olevia Perches. Due 2014   CRANIOTOMY  02/02/12   Dr Harvel Ricks, [place] of aneurysm   CRANIOTOMY  02-02-12   left pterional craniotomy for clipping complex anterior communicating artery aneurysm    HERNIA REPAIR     LEFT HEART CATH AND CORONARY ANGIOGRAPHY N/A 01/12/2018   Procedure: LEFT HEART CATH AND CORONARY ANGIOGRAPHY;  Surgeon: Martinique, Peter M, MD;  Location: [facility];  Service: Cardiovascular;  Laterality: N/A;   LEFT HEART CATHETERIZATION WITH CORONARY ANGIOGRAM N/A 11/26/2013   Procedure: LEFT HEART CATHETERIZATION WITH CORONARY ANGIOGRAM;  Surgeon: [name], MD;  Location: [facility] CATH LAB;  Service: Cardiovascular;  Laterality: N/A;   NOSE SURGERY     PACEMAKER IMPLANT N/A 07/02/2018   Procedure: PACEMAKER IMPLANT;  Surgeon: Constance Haw, MD;  Location: [facility];  Service: Cardiovascular;  Laterality: N/A;   SHOULDER SURGERY  1997   TONSILLECTOMY AND ADENOIDECTOMY     VENTRICULOPERITONEAL SHUNT  02-23-12   INSERTION OF RIGHT FRONTAL VENTRICULOPERITONEAL SHUNT WITH A CODMAN HAKIM PROGRAMMABLE VALVE    Social History   Socioeconomic History   Marital status: Married    Spouse name: Not on file   Number of children: 1   Years of education: Not on file   Highest education level: Not on file  Occupational History   Occupation: retired/ Education officer, community  Tobacco Use   Smoking status: Former    Packs/day: 0.00    Years: 30.00    Pack years: 0.00    Types: Pipe, Cigarettes    Quit date: 07/04/2008    Years since quitting: 12.5   Smokeless tobacco: Former  Scientific laboratory technician Use: Never used  Substance and Sexual Activity   Alcohol use: Yes    Alcohol/week: 0.0 standard drinks    Comment: Very Infrequently    Drug use: No   Sexual activity: Not Currently  Other Topics Concern   Not on file  Social History Narrative   Not on file   Social Determinants of Health   Financial Resource Strain: Not on file  Food Insecurity: Not on file  Transportation Needs: Not on file  Physical Activity: Not on file  Stress: Not on file  Social Connections: Not on file    Family History  Problem Relation Age of Onset   COPD Father    Coronary artery disease Father        MI in 38s   Hepatitis Mother    Diabetes Sister    Dementia Sister 23   Diabetes Brother    Colon cancer Neg Hx    Esophageal cancer Neg Hx    Rectal cancer Neg Hx    Stomach cancer Neg Hx  Colon polyps Neg Hx     Review of Systems  Constitutional:  Positive for fatigue. Negative for fever.  Respiratory:  Negative for shortness of breath.    Cardiovascular:  Negative for chest pain, palpitations and leg swelling.  Musculoskeletal:  Positive for arthralgias, back pain and joint swelling (Right knee).  Neurological:  Negative for headaches.  Hematological:  Bruises/bleeds easily.      Objective:   Vitals:   01/06/21 1320  BP: 134/72  Pulse: 62  Temp: 98.6 F (37 C)  SpO2: 95%   BP Readings from Last 3 Encounters:  01/06/21 134/72  12/17/20 128/68  12/16/20 112/72   Wt Readings from Last 3 Encounters:  01/06/21 245 lb 6.4 oz (111.3 kg)  12/17/20 247 lb 6.4 oz (112.2 kg)  12/16/20 247 lb 9.6 oz (112.3 kg)   Body mass index is 36.77 kg/m.   Physical Exam Constitutional:      General: He is not in acute distress.    Appearance: Normal appearance. He is not ill-appearing.  HENT:     Head: Normocephalic and atraumatic.  Musculoskeletal:     Right lower leg: Edema (Trace) present.     Left lower leg: Edema (Trace) present.     Comments: Full range of motion of right elbow with minimal tenderness on posterior aspect of elbow.  Slightly decreased range of motion of right knee.  Right knee with small effusion but without deformity.  No tenderness along lumbar spine or bilateral SI joints.  When he walks he does have some pain across the lower back, right posterior proximal leg and right knee  Skin:    General: Skin is warm and dry.     Comments: Abrasion on right knee and mild 1 on right elbow.  Significant bruising distal right upper arm  Neurological:     General: No focal deficit present.     Mental Status: He is alert and oriented to person, place, and time.     Sensory: No sensory deficit.     Gait: Gait abnormal.  Psychiatric:        Mood and Affect: Mood normal.        Behavior: Behavior normal.           Assessment & Plan:    See Problem List for Assessment and Plan of chronic medical problems.    This visit occurred during the SARS-CoV-2 public health emergency.  Safety protocols were in  place, including screening questions prior to the visit, additional usage of staff PPE, and extensive cleaning of exam room while observing appropriate contact time as indicated for disinfecting solutions.

## 2021-01-06 ENCOUNTER — Ambulatory Visit (INDEPENDENT_AMBULATORY_CARE_PROVIDER_SITE_OTHER): Payer: Medicare Other | Admitting: Internal Medicine

## 2021-01-06 ENCOUNTER — Encounter: Payer: Self-pay | Admitting: Internal Medicine

## 2021-01-06 ENCOUNTER — Other Ambulatory Visit: Payer: Self-pay

## 2021-01-06 ENCOUNTER — Ambulatory Visit (INDEPENDENT_AMBULATORY_CARE_PROVIDER_SITE_OTHER): Payer: Medicare Other

## 2021-01-06 VITALS — BP 134/72 | HR 62 | Temp 98.6°F | Ht 68.5 in | Wt 245.4 lb

## 2021-01-06 DIAGNOSIS — M79601 Pain in right arm: Secondary | ICD-10-CM

## 2021-01-06 DIAGNOSIS — I2510 Atherosclerotic heart disease of native coronary artery without angina pectoris: Secondary | ICD-10-CM | POA: Diagnosis not present

## 2021-01-06 DIAGNOSIS — M545 Low back pain, unspecified: Secondary | ICD-10-CM

## 2021-01-06 DIAGNOSIS — R2681 Unsteadiness on feet: Secondary | ICD-10-CM | POA: Diagnosis not present

## 2021-01-06 DIAGNOSIS — M533 Sacrococcygeal disorders, not elsewhere classified: Secondary | ICD-10-CM | POA: Diagnosis not present

## 2021-01-06 NOTE — Patient Instructions (Addendum)
   X-rays ordered.  Have them done downstairs.      Medications changes include :   none

## 2021-01-06 NOTE — Assessment & Plan Note (Signed)
Acute on chronic Slightly worse since his most recent fall Walks with a cane Discussed that he would benefit from physical therapy, but unfortunately at this time this is not possible, but can refer him in the near future Deferred walker

## 2021-01-06 NOTE — Assessment & Plan Note (Signed)
Acute  mild pain, but significant bruising right distal upper arm Secondary to recent fall Range of motion of elbow and shoulder normal, minimal tenderness on olecranon, no deformities Likely injury from fall, but no significant injuries and unlikely any broken bones, torn muscles Symptomatic treatment

## 2021-01-06 NOTE — Assessment & Plan Note (Signed)
Acute on chronic He has had some chronic lower back pain, but since his recent fall he has had increased lower back pain and some pain in his proximal, posterior right leg Will get x-ray of lumbar spine and sacrum-coccyx If no acute injury we will hold off on any further treatment Depending on how his ambulation and pain do over the next week or so he may need to see sports medicine Deferred walker Discussed physical therapy in the future, which we will defer for now since his daughter is having major surgery this week

## 2021-01-07 ENCOUNTER — Telehealth: Payer: Self-pay | Admitting: *Deleted

## 2021-01-07 NOTE — Telephone Encounter (Signed)
Notified wife, ok per husband, that there is a time change for pt's procedure on 7/18. Aware to arrive at 8:30 am on that day, not 5:30 as originally advised. Wife verbalized understanding and agreeable to plan.

## 2021-01-14 ENCOUNTER — Ambulatory Visit: Payer: Medicare Other | Admitting: Cardiology

## 2021-01-15 ENCOUNTER — Ambulatory Visit: Payer: Medicare Other | Admitting: Cardiology

## 2021-01-15 ENCOUNTER — Telehealth: Payer: Self-pay | Admitting: *Deleted

## 2021-01-15 NOTE — Telephone Encounter (Signed)
Wife calls in to report that BiV PPM upgrade needs to be postponed.  Pt experienced a seizure and fall during  a walk on 7/2.  He has a tailbone fx Wife reports pt is not getting better and can barely walk now. Her dtr also had some emergency surgery last week and has follow up next week. Aware will cancel procedure scheduled for Monday and discuss w/ Dr. Curt Bears. Aware I will be in touch to get pt rescheduled. Wife is agreeable to plan.

## 2021-01-18 ENCOUNTER — Ambulatory Visit (HOSPITAL_COMMUNITY): Admission: RE | Admit: 2021-01-18 | Payer: Medicare Other | Source: Home / Self Care | Admitting: Cardiology

## 2021-01-18 ENCOUNTER — Encounter (HOSPITAL_COMMUNITY): Admission: RE | Payer: Self-pay | Source: Home / Self Care

## 2021-01-18 SURGERY — BIV UPGRADE

## 2021-01-19 NOTE — Progress Notes (Signed)
Remote pacemaker transmission.   

## 2021-01-27 ENCOUNTER — Encounter: Payer: Self-pay | Admitting: Internal Medicine

## 2021-01-27 ENCOUNTER — Telehealth: Payer: Self-pay | Admitting: Internal Medicine

## 2021-01-27 DIAGNOSIS — M79606 Pain in leg, unspecified: Secondary | ICD-10-CM

## 2021-01-27 NOTE — Telephone Encounter (Signed)
Yes, I can refer to Ortho.  I will put a order referral in for Ortho care with [facility].

## 2021-01-27 NOTE — Telephone Encounter (Signed)
Patient's wife informed of referral.

## 2021-01-27 NOTE — Telephone Encounter (Signed)
Patient had a recent fall, still having pain Patient has pain at hip and leg. Patient xrays done 7/6 Spouse would ike to know if patient should see ortho? Declined  next available with Dr Quay Burow next week Declined to see other providers in the office sooner   Please advise

## 2021-01-28 ENCOUNTER — Ambulatory Visit: Payer: Medicare Other

## 2021-02-01 ENCOUNTER — Other Ambulatory Visit (HOSPITAL_BASED_OUTPATIENT_CLINIC_OR_DEPARTMENT_OTHER): Payer: Self-pay

## 2021-02-09 ENCOUNTER — Telehealth: Payer: Self-pay | Admitting: Cardiology

## 2021-02-09 NOTE — Telephone Encounter (Signed)
Pts wife calling to reschedule his BiV upgrade. Advised her Dr. Curt Bears and his RN are out of the office this week. His RN will follow up next week when she returns to the office.

## 2021-02-09 NOTE — Telephone Encounter (Signed)
Patient's wife states the patient had an appointment 7/18 to have a new leed put in, but it was cancelled when he had a fall 7/2. She states she has not heard back about rescheduling.

## 2021-02-10 ENCOUNTER — Encounter: Payer: Self-pay | Admitting: Orthopaedic Surgery

## 2021-02-10 ENCOUNTER — Ambulatory Visit (INDEPENDENT_AMBULATORY_CARE_PROVIDER_SITE_OTHER): Payer: Medicare Other | Admitting: Orthopaedic Surgery

## 2021-02-10 ENCOUNTER — Other Ambulatory Visit: Payer: Self-pay

## 2021-02-10 ENCOUNTER — Other Ambulatory Visit (HOSPITAL_BASED_OUTPATIENT_CLINIC_OR_DEPARTMENT_OTHER): Payer: Self-pay

## 2021-02-10 ENCOUNTER — Ambulatory Visit (INDEPENDENT_AMBULATORY_CARE_PROVIDER_SITE_OTHER): Payer: Medicare Other

## 2021-02-10 DIAGNOSIS — M25551 Pain in right hip: Secondary | ICD-10-CM

## 2021-02-10 DIAGNOSIS — M545 Low back pain, unspecified: Secondary | ICD-10-CM

## 2021-02-10 DIAGNOSIS — M25552 Pain in left hip: Secondary | ICD-10-CM | POA: Diagnosis not present

## 2021-02-10 DIAGNOSIS — I2510 Atherosclerotic heart disease of native coronary artery without angina pectoris: Secondary | ICD-10-CM | POA: Diagnosis not present

## 2021-02-10 DIAGNOSIS — G8929 Other chronic pain: Secondary | ICD-10-CM | POA: Diagnosis not present

## 2021-02-10 MED ORDER — PREDNISONE 10 MG PO TABS
ORAL_TABLET | ORAL | 0 refills | Status: DC
  Filled 2021-02-10: qty 21, 6d supply, fill #0

## 2021-02-10 NOTE — Progress Notes (Signed)
Office Visit Note   Patient: Jeff Weaver           Date of Birth: 1945/08/18           MRN: [id number] Visit Date: 02/10/2021              Requested by: [name], MD [place],  [place] [id number] PCP: [name], MD   Assessment & Plan: Visit Diagnoses:  1. Bilateral hip pain   2. Chronic midline low back pain without sciatica     Plan: Based on findings impression is spinal stenosis on the lumbar spine.  Hip function well-preserved and does not seem to be symptomatic.  We will need an MRI of the lumbar spine assess for spinal stenosis and to rule out fracture given history of trauma.  We will communicate with the patient after the MRI with neck steps and treatment options.  Prednisone Dosepak prescribed today and we also provided lumbar corset for support.  Follow-Up Instructions: Return if symptoms worsen or fail to improve.   Orders:  Orders Placed This Encounter  Procedures   XR HIPS BILAT W OR W/O PELVIS 3-4 VIEWS   Meds ordered this encounter  Medications   predniSONE (DELTASONE) 10 MG tablet    Sig: Take 6 tablets by mouth daily for 1 day then 5 tabs daily x 1 day then 4 tabs x 1 day 3 tabs x 1 day 2 tabs x 1 day 1 tab x 1 day then stop    Dispense:  21 tablet    Refill:  0      Procedures: No procedures performed   Clinical Data: No additional findings.   Subjective: Chief Complaint  Patient presents with   Left Hip - Pain   Right Hip - Pain   Lower Back - Pain    Jeff Weaver is a very pleasant 75 year old gentleman who is accompanied by his daughter and wife for chronic bilateral hip and low back pain.  He experienced worsening back pain after he fell during a seizure.  He has buttock pain and feels like he is getting worse.  He is unable to walk any significant distances and he has to slow down or stop in order to allow the pain to calm down.  He feels like he is dragging and sliding his feet.  Denies any bowel or bladder  dysfunction.   Review of Systems  Constitutional: Negative.   All other systems reviewed and are negative.   Objective: Vital Signs: There were no vitals taken for this visit.  Physical Exam Vitals and nursing note reviewed.  Constitutional:      Appearance: He is well-developed.  HENT:     Head: Normocephalic and atraumatic.  Eyes:     Pupils: Pupils are equal, round, and reactive to light.  Pulmonary:     Effort: Pulmonary effort is normal.  Abdominal:     Palpations: Abdomen is soft.  Musculoskeletal:        General: Normal range of motion.     Cervical back: Neck supple.  Skin:    General: Skin is warm.  Neurological:     Mental Status: He is alert and oriented to person, place, and time.  Psychiatric:        Behavior: Behavior normal.        Thought Content: Thought content normal.        Judgment: Judgment normal.    Ortho Exam Bilateral hips show  relatively well-preserved range of motion without pain.  Negative Stinchfield.  Negative FADIR.  Nontender. Lumbar spine shows tenderness across the lower back. Specialty Comments:  No specialty comments available.  Imaging: XR HIPS BILAT W OR W/O PELVIS 3-4 VIEWS  Result Date: 02/10/2021 Mild bilateral hip osteoarthritis.    PMFS History: Patient Active Problem List   Diagnosis Date Noted   Right arm pain 01/06/2021   Head injury 11/11/2020   Abrasion of leg with infection, left, initial encounter 10/28/2020   Seizures ([place])    Muscle cramping 12/30/2019   Dizziness 12/30/2019   Unsteady gait 12/30/2019   Pacemaker 11/27/2019   Fatigue 07/09/2019   SSS (sick sinus syndrome) ([place]) 07/02/2018   Bilateral shoulder pain 06/14/2018   Upper airway cough syndrome vs cough variant asthma 03/20/2018   Acute midline low back pain without sciatica 12/25/2017   Depression 10/17/2017   CAD (coronary artery disease), hx of NSTEMI 08/15/2017   Chest pain 07/17/2017   SOBOE (shortness of breath on exertion)  06/08/2017   Bilateral sensorineural hearing loss 06/01/2017   Nasal turbinate hypertrophy 06/01/2017   Dysphagia 05/31/2017   Osteoporosis 01/30/2017   History of cerebral hemorrhage 12/28/2016   Seborrheic dermatitis 07/25/2016   Cognitive communication deficit 06/28/2016   Hypertensive heart disease 06/28/2016   Vitamin B 12 deficiency 03/16/2016   Memory difficulties 03/13/2016   VP (ventriculoperitoneal) shunt status 02/12/2016   Orthostatic hypotension 01/05/2016   Benign prostatic hyperplasia with urinary obstruction 08/04/2014   Urinary urgency 08/04/2014   Gastroesophageal reflux disease 07/14/2014   Polymyalgia rheumatica ([place]) 05/28/2014   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/2014   SBE (subacute bacterial endocarditis) prophylaxis candidate 11/24/2013   Aphasia 07/17/2013   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 seizures, not intractable, without status epilepticus ([place]) 07/17/2013   Subdural hemorrhage ([place]) 07/17/2013   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[place]) 07/17/2013   Pre-diabetes 03/26/2013   Increased frequency of urination 10/31/2012   Nocturia 10/31/2012   History of cerebral aneurysm repair 10/18/2012   History of stroke without residual deficits 10/18/2012   Blepharitis of both eyes 09/27/2012   Ocular rosacea 09/27/2012   Seizure disorder ([place]) 07/23/2012   Entropion 07/10/2012   Hyperopia with astigmatism 07/10/2012   Combined senile cataract 04/13/2012   Presbyopia 04/13/2012   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 mild 2017 echo.   Atypical chest pain    Cerebral aneurysm     a. s/p clipping at [facility] in 8/13 c/b short-term memory loss, cerebral hemorrhage and seizure disorder s/p VP shunt.   Cerebral hemorrhage (HCC)    Chronic diastolic CHF (congestive heart failure) (HCC)    CVA (cerebral infarction)    Diverticulosis    Enteritis (regional)    Dr Olevia Perches   First degree AV block    GI bleed    HTN (hypertension)    Hyperlipidemia    Hypertensive heart disease    IBS (irritable bowel syndrome)    Mild CAD    Cardiac cath 01/12/18 showed minimal nonobstructive CAD, normal LVEF, normal LVEDP.   NSVT (nonsustained ventricular tachycardia) (HCC)    Orthostatic hypotension    Pacemaker  Pre-diabetes    PVC's (premature ventricular contractions)    Regional enteritis of large intestine ([place]) since 1978   Rheumatoid arthritis(714.0)    dxed in [employer] in 1980s   Second degree AV block, Mobitz type I    Seizures ([place])    last April 26,2021   Sinus bradycardia    Sleep apnea    SSS (sick sinus syndrome) ([place])    a. s/p PPM 07/02/18.    Family History  Problem Relation Age of Onset   COPD Father    Coronary artery disease Father        MI in 61s   Hepatitis Mother    Diabetes Sister    Dementia Sister 46   Diabetes Brother    Colon cancer Neg Hx    Esophageal cancer Neg Hx    Rectal cancer Neg Hx    Stomach cancer Neg Hx    Colon polyps Neg Hx     Past Surgical History:  Procedure Laterality Date   CHOLECYSTECTOMY N/A 01/07/2016   Procedure: LAPAROSCOPIC CHOLECYSTECTOMY ;  Surgeon: Coralie Keens, MD;  Location: [place];  Service: General;  Laterality: N/A;   cns shunt  02/23/12   COLONOSCOPY W/ POLYPECTOMY  1978   negative 2009, Dr Olevia Perches. Due 2014   CRANIOTOMY  02/02/12   Dr Harvel Ricks, [place] of aneurysm   CRANIOTOMY  02-02-12   left pterional craniotomy for clipping complex anterior communicating artery aneurysm    HERNIA REPAIR     LEFT HEART CATH AND CORONARY ANGIOGRAPHY N/A 01/12/2018   Procedure: LEFT HEART CATH AND CORONARY  ANGIOGRAPHY;  Surgeon: Martinique, Peter M, MD;  Location: [facility] CV [facility];  Service: Cardiovascular;  Laterality: N/A;   LEFT HEART CATHETERIZATION WITH CORONARY ANGIOGRAM N/A 11/26/2013   Procedure: LEFT HEART CATHETERIZATION WITH CORONARY ANGIOGRAM;  Surgeon: [name], MD;  Location[facility] C[facility];  Service: Cardiovascular;  Laterality: N/A;   NOSE SURGERY     PACEMAKER IMPLANT N/A 07/02/2018   Procedure: PACEMAKER IMPLANT;  Surgeon: Constance Haw, MD;  Location: [facility] CV LAB;  Service: Cardiovascular;  Laterality: N/A;   SHOULDER SURGERY  1997   TONSILLECTOMY AND ADENOIDECTOMY     VENTRICULOPERITONEAL SHUNT  02-23-12   INSERTION OF RIGHT FRONTAL VENTRICULOPERITONEAL SHUNT WITH A CODMAN HAKIM PROGRAMMABLE VALVE   Social History   Occupational History   Occupation: retired/ Education officer, community  Tobacco Use   Smoking status: Former    Packs/day: 0.00    Years: 30.00    Pack years: 0.00    Types: Pipe, Cigarettes    Quit date: 07/04/2008    Years since quitting: 12.6   Smokeless tobacco: Former  Scientific laboratory technician Use: Never used  Substance and Sexual Activity   Alcohol use: Yes    Alcohol/week: 0.0 standard drinks    Comment: Very Infrequently    Drug use: No   Sexual activity: Not Currently

## 2021-02-11 NOTE — Addendum Note (Signed)
Addended by: Precious Bard on: 02/11/2021 09:42 AM   Modules accepted: Orders

## 2021-02-18 ENCOUNTER — Telehealth: Payer: Self-pay | Admitting: Cardiology

## 2021-02-18 NOTE — Telephone Encounter (Signed)
Aware I will discuss if pt needs to be seen prior to  rescheduling. Wife agreeable and knows I will call after discussing

## 2021-02-18 NOTE — Telephone Encounter (Signed)
   Pt's wife calling to schedule pt's procedure

## 2021-02-22 ENCOUNTER — Other Ambulatory Visit (HOSPITAL_BASED_OUTPATIENT_CLINIC_OR_DEPARTMENT_OTHER): Payer: Self-pay

## 2021-02-25 ENCOUNTER — Telehealth: Payer: Self-pay | Admitting: Cardiology

## 2021-02-25 ENCOUNTER — Encounter: Payer: Self-pay | Admitting: *Deleted

## 2021-02-25 DIAGNOSIS — I5032 Chronic diastolic (congestive) heart failure: Secondary | ICD-10-CM

## 2021-02-25 NOTE — Telephone Encounter (Signed)
Returned call to Pt's wife.  Further education given on reasoning for upgrade to [place] with this nurse calling tomorrow to schedule.

## 2021-02-25 NOTE — Telephone Encounter (Signed)
Patient's wife called and mentioned that Dr. Curt Bears nurse was supposed to give her a call this week due to her being out last week and still haven't gotten a phone call yet in regards to the patient. Please call back

## 2021-03-01 NOTE — Telephone Encounter (Signed)
Pt scheduled for BIV PPM on 03/10/2021 at 2:30 pm  Instruction letter sent via mychart  Need to confirm Pt has surgical scrub.

## 2021-03-02 NOTE — Telephone Encounter (Signed)
Left detailed message for Pt's wife.  Advised instruction letter ready for review via mychart.  Advised to contact office if any further questions.  Work up complete.

## 2021-03-03 ENCOUNTER — Other Ambulatory Visit: Payer: Self-pay

## 2021-03-03 DIAGNOSIS — I110 Hypertensive heart disease with heart failure: Secondary | ICD-10-CM | POA: Diagnosis not present

## 2021-03-04 LAB — BASIC METABOLIC PANEL
BUN/Creatinine Ratio: 11 (ref 10–24)
BUN: 10 mg/dL (ref 8–27)
CO2: 30 mmol/L — ABNORMAL HIGH (ref 20–29)
Calcium: 9.7 mg/dL (ref 8.6–10.2)
Chloride: 97 mmol/L (ref 96–106)
Creatinine, Ser: 0.9 mg/dL (ref 0.76–1.27)
Glucose: 124 mg/dL — ABNORMAL HIGH (ref 65–99)
Potassium: 4.3 mmol/L (ref 3.5–5.2)
Sodium: 139 mmol/L (ref 134–144)
eGFR: 89 mL/min/{1.73_m2} (ref >59)

## 2021-03-04 LAB — CBC
Hematocrit: 43.4 % (ref 37.5–51.0)
Hemoglobin: 15.1 g/dL (ref 13.0–17.7)
MCH: 31.1 pg (ref 26.6–33.0)
MCHC: 34.8 g/dL (ref 31.5–35.7)
MCV: 90 fL (ref 79–97)
Platelets: 186 10*3/uL (ref 150–450)
RBC: 4.85 x10E6/uL (ref 4.14–5.80)
RDW: 12.2 % (ref 11.6–15.4)
WBC: 6.9 10*3/uL (ref 3.4–10.8)

## 2021-03-04 NOTE — Telephone Encounter (Signed)
[name] is calling requesting to speak with Sonia Baller in regards to some questions about this upcoming procedure.

## 2021-03-05 NOTE — Telephone Encounter (Signed)
Returned call to Pt's wife.  Advised ok to take seizure medicine on day of procedure.  Per Pt's wife, they have CHG scrub   All questions answered.

## 2021-03-09 NOTE — Pre-Procedure Instructions (Signed)
Attempted to call patient regarding discharge instructions.  Left voicemail on the following  items: Arrival time 1230 Nothing to eat or drink after midnight No meds AM of procedure Responsible person to drive you home and stay with you for 24 hrs Wash with special soap night before and morning of procedure

## 2021-03-10 ENCOUNTER — Ambulatory Visit (HOSPITAL_COMMUNITY)
Admission: RE | Disposition: A | Discharge status: Home / Self Care | Payer: Self-pay | Source: Home / Self Care | Attending: Cardiology

## 2021-03-10 ENCOUNTER — Ambulatory Visit (HOSPITAL_COMMUNITY): Payer: Medicare Other

## 2021-03-10 ENCOUNTER — Telehealth: Payer: Self-pay | Admitting: Cardiology

## 2021-03-10 ENCOUNTER — Other Ambulatory Visit: Payer: Self-pay

## 2021-03-10 ENCOUNTER — Ambulatory Visit (HOSPITAL_COMMUNITY)
Admission: RE | Admit: 2021-03-10 | Discharge: 2021-03-10 | Disposition: A | Discharge status: Home / Self Care | Payer: Medicare Other | Attending: Cardiology | Admitting: Cardiology

## 2021-03-10 DIAGNOSIS — R001 Bradycardia, unspecified: Secondary | ICD-10-CM | POA: Diagnosis not present

## 2021-03-10 DIAGNOSIS — Z95818 Presence of other cardiac implants and grafts: Secondary | ICD-10-CM

## 2021-03-10 DIAGNOSIS — Z45010 Encounter for checking and testing of cardiac pacemaker pulse generator [battery]: Secondary | ICD-10-CM | POA: Diagnosis not present

## 2021-03-10 DIAGNOSIS — Z87891 Personal history of nicotine dependence: Secondary | ICD-10-CM | POA: Insufficient documentation

## 2021-03-10 DIAGNOSIS — Z8249 Family history of ischemic heart disease and other diseases of the circulatory system: Secondary | ICD-10-CM | POA: Insufficient documentation

## 2021-03-10 DIAGNOSIS — I442 Atrioventricular block, complete: Secondary | ICD-10-CM | POA: Diagnosis not present

## 2021-03-10 DIAGNOSIS — I5042 Chronic combined systolic (congestive) and diastolic (congestive) heart failure: Secondary | ICD-10-CM | POA: Insufficient documentation

## 2021-03-10 DIAGNOSIS — I447 Left bundle-branch block, unspecified: Secondary | ICD-10-CM | POA: Diagnosis not present

## 2021-03-10 DIAGNOSIS — I428 Other cardiomyopathies: Secondary | ICD-10-CM | POA: Insufficient documentation

## 2021-03-10 DIAGNOSIS — Z888 Allergy status to other drugs, medicaments and biological substances status: Secondary | ICD-10-CM | POA: Diagnosis not present

## 2021-03-10 DIAGNOSIS — I5022 Chronic systolic (congestive) heart failure: Secondary | ICD-10-CM | POA: Diagnosis not present

## 2021-03-10 DIAGNOSIS — G4730 Sleep apnea, unspecified: Secondary | ICD-10-CM | POA: Insufficient documentation

## 2021-03-10 DIAGNOSIS — I110 Hypertensive heart disease with heart failure: Secondary | ICD-10-CM | POA: Diagnosis not present

## 2021-03-10 DIAGNOSIS — Z950 Presence of cardiac pacemaker: Secondary | ICD-10-CM | POA: Diagnosis not present

## 2021-03-10 DIAGNOSIS — E785 Hyperlipidemia, unspecified: Secondary | ICD-10-CM | POA: Insufficient documentation

## 2021-03-10 HISTORY — PX: BIV PACEMAKER INSERTION CRT-P: EP1199

## 2021-03-10 SURGERY — BIV PACEMAKER INSERTION CRT-P

## 2021-03-10 MED ORDER — SODIUM CHLORIDE 0.9 % IV SOLN
INTRAVENOUS | Status: AC
  Filled 2021-03-10: qty 2

## 2021-03-10 MED ORDER — LIDOCAINE HCL 1 % IJ SOLN
INTRAMUSCULAR | Status: AC
  Filled 2021-03-10: qty 60

## 2021-03-10 MED ORDER — MIDAZOLAM HCL 5 MG/5ML IJ SOLN
INTRAMUSCULAR | Status: AC
  Filled 2021-03-10: qty 5

## 2021-03-10 MED ORDER — MIDAZOLAM HCL 5 MG/5ML IJ SOLN
INTRAMUSCULAR | Status: DC | PRN
  Administered 2021-03-10 (×2): 1 mg via INTRAVENOUS

## 2021-03-10 MED ORDER — POVIDONE-IODINE 10 % EX SWAB: 2.0000 "application " | Freq: Once | CUTANEOUS | Status: DC

## 2021-03-10 MED ORDER — FENTANYL CITRATE (PF) 100 MCG/2ML IJ SOLN
INTRAMUSCULAR | Status: AC
  Filled 2021-03-10: qty 2

## 2021-03-10 MED ORDER — ONDANSETRON HCL 4 MG/2ML IJ SOLN: 4.0000 mg | Freq: Four times a day (QID) | INTRAMUSCULAR | Status: DC | PRN

## 2021-03-10 MED ORDER — SODIUM CHLORIDE 0.9 % IV SOLN
80.0000 mg | INTRAVENOUS | Status: AC
  Administered 2021-03-10: 80 mg

## 2021-03-10 MED ORDER — CEFAZOLIN SODIUM-DEXTROSE 2-4 GM/100ML-% IV SOLN
2.0000 g | INTRAVENOUS | Status: AC
  Administered 2021-03-10: 2 g via INTRAVENOUS

## 2021-03-10 MED ORDER — ACETAMINOPHEN 325 MG PO TABS
325.0000 mg | ORAL_TABLET | ORAL | Status: DC | PRN
  Filled 2021-03-10: qty 2

## 2021-03-10 MED ORDER — CHLORHEXIDINE GLUCONATE 4 % EX LIQD: 4.0000 "application " | Freq: Once | CUTANEOUS | Status: DC

## 2021-03-10 MED ORDER — LIDOCAINE HCL (PF) 1 % IJ SOLN
INTRAMUSCULAR | Status: DC | PRN
  Administered 2021-03-10: 60 mL

## 2021-03-10 MED ORDER — FENTANYL CITRATE (PF) 100 MCG/2ML IJ SOLN
INTRAMUSCULAR | Status: DC | PRN
  Administered 2021-03-10 (×2): 25 ug via INTRAVENOUS

## 2021-03-10 MED ORDER — HEPARIN (PORCINE) IN NACL 1000-0.9 UT/500ML-% IV SOLN
INTRAVENOUS | Status: DC | PRN
  Administered 2021-03-10: 500 mL

## 2021-03-10 MED ORDER — CEFAZOLIN SODIUM-DEXTROSE 2-4 GM/100ML-% IV SOLN
INTRAVENOUS | Status: AC
  Filled 2021-03-10: qty 100

## 2021-03-10 MED ORDER — SODIUM CHLORIDE 0.9 % IV SOLN: INTRAVENOUS | Status: DC

## 2021-03-10 MED ORDER — CEFAZOLIN SODIUM-DEXTROSE 1-4 GM/50ML-% IV SOLN
1.0000 g | Freq: Four times a day (QID) | INTRAVENOUS | Status: DC
Start: 2021-03-10 — End: 2021-03-11
  Administered 2021-03-10: 1 g via INTRAVENOUS
  Filled 2021-03-10 (×2): qty 50

## 2021-03-10 SURGICAL SUPPLY — 15 items
BALLN ATTAIN 80 (BALLOONS) ×2
BALLOON ATTAIN 80 (BALLOONS) IMPLANT
CABLE SURGICAL S-101-97-12 (CABLE) ×2 IMPLANT
CATH ATTAIN COM SURV 6250V-EH (CATHETERS) ×1 IMPLANT
CATH JOSEPH QUAD ALLRED 6F REP (CATHETERS) ×1 IMPLANT
DEVICE CRTP PERCEPTA QUAD MRI (Pacemaker) ×1 IMPLANT
LEAD ATTAIN PERFORMA S 4598-88 (Lead) ×1 IMPLANT
MAT PREVALON FULL STRYKER (MISCELLANEOUS) ×1 IMPLANT
PAD PRO RADIOLUCENT 2001M-C (PAD) ×2 IMPLANT
SHEATH 9FR PRELUDE SNAP 13 (SHEATH) ×1 IMPLANT
SHEATH PROBE COVER 6X72 (BAG) ×1 IMPLANT
SLITTER 6232ADJ (MISCELLANEOUS) ×1 IMPLANT
TRAY PACEMAKER INSERTION (PACKS) ×2 IMPLANT
WIRE ACUITY WHISPER EDS 4648 (WIRE) ×1 IMPLANT
WIRE HI TORQ VERSACORE-J 145CM (WIRE) ×1 IMPLANT

## 2021-03-10 NOTE — Telephone Encounter (Signed)
Advised wife ok to take seizure medication with a sip of water.

## 2021-03-10 NOTE — H&P (Signed)
Electrophysiology Office Note   Date:  03/10/2021   ID:  Jeff Weaver, DOB January 26, 1946, MRN [id number]  PCP:  [name], MD  Cardiologist:  Radford Pax Primary Electrophysiologist:  Ky Moskowitz Meredith Leeds, MD    No chief complaint on file.    History of Present Illness: Jeff Weaver is a 75 y.o. male who is being seen today for the evaluation of bradycardia at the request of [name]. Presenting today for electrophysiology evaluation.    He has a history significant for obesity, orthostasis, hyperlipidemia, prediabetes, cerebral aneurysm post clipping, seizure disorder status post VP shunt, sleep apnea, and aortic insufficiency.  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  Unfortunately he has developed systolic dysfunction potentially due to RV pacing.  Today, denies symptoms of palpitations, chest pain, orthopnea, PND, lower extremity edema, claudication, dizziness, presyncope, syncope, bleeding, or neurologic sequela. The patient is tolerating medications without difficulties.  His main symptoms are shortness of breath and fatigue.  He finds it difficult daily activities due to his level of shortness of breath.  He has been sleeping more than usual as well.  Past Medical History:  Diagnosis Date   Aortic insufficiency    a. mild 2017 echo.   Atypical chest pain    Cerebral aneurysm    a. s/p clipping at [facility] in 8/13 c/b short-term memory loss, cerebral hemorrhage and seizure disorder s/p VP shunt.   Cerebral hemorrhage (HCC)    Chronic diastolic CHF (congestive heart failure) (HCC)    CVA (cerebral infarction)    Diverticulosis    Enteritis (regional)    Dr Olevia Perches   First degree AV block    GI bleed    HTN (hypertension)    Hyperlipidemia    Hypertensive heart disease    IBS (irritable bowel syndrome)    Mild CAD    Cardiac cath 01/12/18 showed minimal nonobstructive CAD, normal LVEF, normal  LVEDP.   NSVT (nonsustained ventricular tachycardia) (HCC)    Orthostatic hypotension    Pacemaker    Pre-diabetes    PVC's (premature ventricular contractions)    Regional enteritis of large intestine ([place]) since 1978   Rheumatoid arthritis(714.0)    dxed in [employer] in 1980s   Second degree AV block, Mobitz type I    Seizures ([place])    last April 26,2021   Sinus bradycardia    Sleep apnea    SSS (sick sinus syndrome) ([place])    a. s/p PPM 07/02/18.   Past Surgical History:  Procedure Laterality Date   CHOLECYSTECTOMY N/A 01/07/2016   Procedure: LAPAROSCOPIC CHOLECYSTECTOMY ;  Surgeon: Coralie Keens, MD;  Location: [facility];  Service: General;  Laterality: N/A;   cns shunt  02/23/12   COLONOSCOPY W/ POLYPECTOMY  1978   negative 2009, Dr Olevia Perches. Due 2014   CRANIOTOMY  02/02/12   Dr Harvel Ricks, [place] of aneurysm   CRANIOTOMY  02-02-12   left pterional craniotomy for clipping complex anterior communicating artery aneurysm    HERNIA REPAIR     LEFT HEART CATH AND CORONARY ANGIOGRAPHY N/A 01/12/2018   Procedure: LEFT HEART CATH AND CORONARY ANGIOGRAPHY;  Surgeon: Martinique, Peter M, MD;  Location: [facility] CV LAB;  Service: Cardiovascular;  Laterality: N/A;   LEFT HEART CATHETERIZATION WITH CORONARY ANGIOGRAM N/A 11/26/2013   Procedure: LEFT HEART CATHETERIZATION WITH CORONARY ANGIOGRAM;  Surgeon: [name], MD;  Location: [facility] CATH LAB;  Service: Cardiovascular;  Laterality: N/A;  NOSE SURGERY     PACEMAKER IMPLANT N/A 07/02/2018   Procedure: PACEMAKER IMPLANT;  Surgeon: Constance Haw, MD;  Location: [place] CV LAB;  Service: Cardiovascular;  Laterality: N/A;   SHOULDER SURGERY  1997   TONSILLECTOMY AND ADENOIDECTOMY     VENTRICULOPERITONEAL SHUNT  02-23-12   INSERTION OF RIGHT FRONTAL VENTRICULOPERITONEAL SHUNT WITH A CODMAN HAKIM PROGRAMMABLE VALVE     No current facility-administered medications for this encounter.    Allergies:   Haloperidol, Rosuvastatin,  Simvastatin, Lisinopril, Nifedipine, and Pravastatin   Social History:  The patient  reports that he quit smoking about 12 years ago. His smoking use included pipe and cigarettes. He has quit using smokeless tobacco. He reports current alcohol use. He reports that he does not use drugs.   Family History:  The patient's family history includes COPD in his father; Coronary artery disease in his father; Dementia (age of onset: 23) in his sister; Diabetes in his brother and sister; Hepatitis in his mother.   ROS:  Please see the history of present illness.   Otherwise, review of systems is positive for none.   All other systems are reviewed and negative.   PHYSICAL EXAM: VS:  There were no vitals taken for this visit. , BMI There is no height or weight on file to calculate BMI. GEN: Well nourished, well developed, in no acute distress  HEENT: normal  Neck: no JVD, carotid bruits, or masses Cardiac: RRR; no murmurs, rubs, or gallops,no edema  Respiratory:  clear to auscultation bilaterally, normal work of breathing GI: soft, nontender, nondistended, + BS MS: no deformity or atrophy  Skin: warm and dry, device site well healed Neuro:  Strength and sensation are intact Psych: euthymic mood, full affect  EKG:  EKG is not ordered today. Personal review of the ekg ordered 11/19/20 shows sinus rhythm, ventricular paced, rate 68, PVCs  Personal review of the device interrogation today. Results in [place]: 11/09/2020: ALT 30; Magnesium 1.9; NT-Pro BNP 430; TSH 1.240 03/03/2021: BUN 10; Creatinine, Ser 0.90; Hemoglobin 15.1; Platelets 186; Potassium 4.3; Sodium 139    Lipid Panel     Component Value Date/Time   CHOL 135 11/09/2020 1011   CHOL 159 03/27/2013 0818   TRIG 81 11/09/2020 1011   TRIG 127 03/27/2013 0818   HDL 46 11/09/2020 1011   HDL 45 03/27/2013 0818   CHOLHDL 2.9 11/09/2020 1011   CHOLHDL 3 04/18/2018 1023   VLDL 26.2 04/18/2018 1023   LDLCALC 73 11/09/2020 1011    LDLCALC 89 03/27/2013 0818   LDLDIRECT 87.6 09/19/2012 1206     Wt Readings from Last 3 Encounters:  01/06/21 111.3 kg  12/17/20 112.2 kg  12/16/20 112.3 kg      Other studies Reviewed: Additional studies/ records that were reviewed today include: [facility] 01/12/18  Review of the above records today demonstrates:  The left ventricular systolic function is normal. LV end diastolic pressure is normal. The left ventricular ejection fraction is 55-65% by visual estimate.     1. Minimal nonobstructive CAD 2. Normal LV function 3. Normal LVEDP  TTE 11/24/2020  1. Left ventricular ejection fraction, by estimation, is 35% with  hypokinesis of the inferoseptal, inferior , mid/distal anterior, distal  lateral and apical walls . The left ventricle has moderately decreased  function. There is moderate left ventricular   hypertrophy. Left ventricular diastolic parameters are consistent with  Grade I diastolic dysfunction (impaired relaxation). Elevated left atrial  pressure. Compared  to echo report from 2017, LVEF is worse   2. Right ventricular systolic function is low normal. The right  ventricular size is normal.   3. The mitral valve is normal in structure. No evidence of mitral valve  regurgitation.   4. The aortic valve is normal in structure. Aortic valve regurgitation is  mild.   5. Aortic dilatation noted. There is mild dilatation of the aortic root,  measuring 41 mm.   Myoview 12/16/2020 The left ventricular ejection fraction is mildly decreased (45-54%). Nuclear stress EF: 46%. There was no ST segment deviation noted during stress. No T wave inversion was noted during stress. The study is normal. This is an intermediate risk study due to reduced systolic function. There is no ischemia.  ASSESSMENT AND PLAN:  1.  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  Device functioning appropriately.  2.  Systolic heart failure: Jeff Weaver has presented today for surgery, with the diagnosis of junctional bradycardia, systolic heart failure.  The various methods of treatment have been discussed with the patient and family. After consideration of risks, benefits and other options for treatment, the patient has consented to  Procedure(s): Pacemaker to biventricular pacemaker upgrade as a surgical intervention .  Risks include but not limited to bleeding, infection, pneumothorax, perforation, tamponade, vascular damage, renal failure, MI, stroke, death, and lead dislodgement . The patient's history has been reviewed, patient examined, no change in status, stable for surgery.  I have reviewed the patient's chart and labs.  Questions were answered to the patient's satisfaction.    Maanya Hippert Curt Bears, MD 03/10/2021 12:11 PM

## 2021-03-10 NOTE — Discharge Instructions (Signed)
    Supplemental Discharge Instructions for  Pacemaker/Defibrillator Patients  Tomorrow, 03/11/21, send in a device transmission  Activity No heavy lifting or vigorous activity with your left/right arm for 6 to 8 weeks.  Do not raise your left/right arm above your head for one week.  Gradually raise your affected arm as drawn below.              03/15/21                    03/16/21                    03/17/21                 03/18/21 __  NO DRIVING until cleared to at your wound check visit.  WOUND CARE Keep the wound area clean and dry.  Do not get this area wet , no showers until cleared to at your wound check visit. Tomorrow, 03/11/21, remove the arm sling Tomorrow, 03/11/21 remove the LARGE outer plastic bandage.  Underneath the plastic bandage there are steri strips (paper tapes), DO NOT remove these. The tape/steri-strips on your wound will fall off; do not pull them off.  No bandage is needed on the site.  DO  NOT apply any creams, oils, or ointments to the wound area. If you notice any drainage or discharge from the wound, any swelling or bruising at the site, or you develop a fever > 101? F after you are discharged home, call the office at once.  Special Instructions You are still able to use cellular telephones; use the ear opposite the side where you have your pacemaker/defibrillator.  Avoid carrying your cellular phone near your device. When traveling through airports, show security personnel your identification card to avoid being screened in the metal detectors.  Ask the security personnel to use the hand wand. Avoid arc welding equipment, MRI testing (magnetic resonance imaging), TENS units (transcutaneous nerve stimulators).  Call the office for questions about other devices. Avoid electrical appliances that are in poor condition or are not properly grounded. Microwave ovens are safe to be near or to operate.

## 2021-03-10 NOTE — Telephone Encounter (Signed)
Pt c/o medication issue:  1. Name of Medication:  levETIRAcetam (KEPPRA) 500 MG tablet VIMPAT 200 MG TABS tablet 2. How are you currently taking this medication (dosage and times per day)? Keppra two in the morning and Vimpat 1 tablet in the morning   3. Are you having a reaction (difficulty breathing--STAT)? No   4. What is your medication issue? Wants to know if he can take these seizure medications due to having a procedure this morning.

## 2021-03-10 NOTE — Progress Notes (Signed)
Camn[place], MD notified of EKG and CXR results. MD stated okay to D/C.

## 2021-03-11 ENCOUNTER — Encounter (HOSPITAL_COMMUNITY): Payer: Self-pay | Admitting: Cardiology

## 2021-03-11 MED FILL — Lidocaine HCl Local Inj 1%: INTRAMUSCULAR | Qty: 60 | Status: AC

## 2021-03-23 ENCOUNTER — Telehealth: Payer: Self-pay

## 2021-03-23 ENCOUNTER — Ambulatory Visit (INDEPENDENT_AMBULATORY_CARE_PROVIDER_SITE_OTHER): Payer: Medicare Other | Admitting: Student

## 2021-03-23 ENCOUNTER — Other Ambulatory Visit: Payer: Self-pay

## 2021-03-23 DIAGNOSIS — I420 Dilated cardiomyopathy: Secondary | ICD-10-CM | POA: Diagnosis not present

## 2021-03-23 LAB — CUP PACEART INCLINIC DEVICE CHECK
Battery Remaining Longevity: 128 mo
Battery Voltage: 3.21 V
Brady Statistic AP VP Percent: 59.56 %
Brady Statistic AP VS Percent: 0.01 %
Brady Statistic AS VP Percent: 39.73 %
Brady Statistic AS VS Percent: 0.7 %
Brady Statistic RA Percent Paced: 59.86 %
Brady Statistic RV Percent Paced: 99.29 %
Date Time Interrogation Session: 20220920103804
Implantable Lead Implant Date: 20191230
Implantable Lead Implant Date: 20191230
Implantable Lead Implant Date: 20220907
Implantable Lead Location: 753858
Implantable Lead Location: 753859
Implantable Lead Location: 753860
Implantable Lead Model: 4598
Implantable Lead Model: 5076
Implantable Lead Model: 5076
Implantable Pulse Generator Implant Date: 20220907
Lead Channel Impedance Value: 304 Ohm
Lead Channel Impedance Value: 323 Ohm
Lead Channel Impedance Value: 380 Ohm
Lead Channel Impedance Value: 399 Ohm
Lead Channel Impedance Value: 456 Ohm
Lead Channel Impedance Value: 494 Ohm
Lead Channel Impedance Value: 570 Ohm
Lead Channel Impedance Value: 646 Ohm
Lead Channel Impedance Value: 646 Ohm
Lead Channel Impedance Value: 684 Ohm
Lead Channel Impedance Value: 722 Ohm
Lead Channel Impedance Value: 817 Ohm
Lead Channel Impedance Value: 893 Ohm
Lead Channel Impedance Value: 912 Ohm
Lead Channel Pacing Threshold Amplitude: 0.5 V
Lead Channel Pacing Threshold Amplitude: 0.625 V
Lead Channel Pacing Threshold Amplitude: 1.125 V
Lead Channel Pacing Threshold Pulse Width: 0.4 ms
Lead Channel Pacing Threshold Pulse Width: 0.4 ms
Lead Channel Pacing Threshold Pulse Width: 0.4 ms
Lead Channel Sensing Intrinsic Amplitude: 1.5 mV
Lead Channel Sensing Intrinsic Amplitude: 12.875 mV
Lead Channel Sensing Intrinsic Amplitude: 2 mV
Lead Channel Setting Pacing Amplitude: 1.5 V
Lead Channel Setting Pacing Amplitude: 2.25 V
Lead Channel Setting Pacing Amplitude: 2.25 V
Lead Channel Setting Pacing Pulse Width: 0.4 ms
Lead Channel Setting Pacing Pulse Width: 0.4 ms
Lead Channel Setting Sensing Sensitivity: 1.2 mV

## 2021-03-23 NOTE — Telephone Encounter (Signed)
Patient wife is requesting a disability parking place card.  They would like for it to be permenat (Medical providers recert not required) and because he can not walk 89f w/o stopping to rest.  Please call when form is completed. [phone]  Please provide form.

## 2021-03-23 NOTE — Progress Notes (Signed)
Wound check appointment. Steri-strips removed. Wound without redness or edema. Incision edges approximated, wound well healed. Normal device function. Thresholds, sensing, and impedances consistent with implant measurements. Device programmed at appropriate chronic safety margins for RV/RA. Auto capture programmed on for RV/LV. Histogram distribution appropriate for patient and level of activity. Patient educated about wound care, arm mobility, lifting restrictions. ROV for usual post follow up care.

## 2021-03-23 NOTE — Telephone Encounter (Signed)
Message left today for patient that paperwork left up front and ready for pick up.

## 2021-03-26 ENCOUNTER — Telehealth: Payer: Self-pay

## 2021-03-26 ENCOUNTER — Other Ambulatory Visit (INDEPENDENT_AMBULATORY_CARE_PROVIDER_SITE_OTHER): Payer: Medicare Other

## 2021-03-26 ENCOUNTER — Other Ambulatory Visit: Payer: Self-pay

## 2021-03-26 DIAGNOSIS — I10 Essential (primary) hypertension: Secondary | ICD-10-CM

## 2021-03-26 DIAGNOSIS — R410 Disorientation, unspecified: Secondary | ICD-10-CM

## 2021-03-26 DIAGNOSIS — R82998 Other abnormal findings in urine: Secondary | ICD-10-CM | POA: Diagnosis not present

## 2021-03-26 LAB — URINALYSIS, ROUTINE W REFLEX MICROSCOPIC
Bilirubin Urine: NEGATIVE
Ketones, ur: NEGATIVE
Nitrite: NEGATIVE
Specific Gravity, Urine: 1.02 (ref 1.000–1.030)
Urine Glucose: NEGATIVE
Urobilinogen, UA: 1 (ref 0.0–1.0)
pH: 6 (ref 5.0–8.0)

## 2021-03-26 LAB — COMPREHENSIVE METABOLIC PANEL
ALT: 28 U/L (ref 0–53)
AST: 21 U/L (ref 0–37)
Albumin: 4.1 g/dL (ref 3.5–5.2)
Alkaline Phosphatase: 55 U/L (ref 39–117)
BUN: 13 mg/dL (ref 6–23)
CO2: 33 mEq/L — ABNORMAL HIGH (ref 19–32)
Calcium: 9.7 mg/dL (ref 8.4–10.5)
Chloride: 99 mEq/L (ref 96–112)
Creatinine, Ser: 0.99 mg/dL (ref 0.40–1.50)
GFR: 73.82 mL/min (ref >60.00)
Glucose, Bld: 124 mg/dL — ABNORMAL HIGH (ref 70–99)
Potassium: 3.6 mEq/L (ref 3.5–5.1)
Sodium: 138 mEq/L (ref 135–145)
Total Bilirubin: 0.8 mg/dL (ref 0.2–1.2)
Total Protein: 6.9 g/dL (ref 6.0–8.3)

## 2021-03-26 LAB — CBC WITH DIFFERENTIAL/PLATELET
Basophils Absolute: 0 10*3/uL (ref 0.0–0.1)
Basophils Relative: 0.6 % (ref 0.0–3.0)
Eosinophils Absolute: 0.1 10*3/uL (ref 0.0–0.7)
Eosinophils Relative: 1.9 % (ref 0.0–5.0)
HCT: 43.1 % (ref 39.0–52.0)
Hemoglobin: 14.7 g/dL (ref 13.0–17.0)
Lymphocytes Relative: 20.9 % (ref 12.0–46.0)
Lymphs Abs: 1.3 10*3/uL (ref 0.7–4.0)
MCHC: 34.1 g/dL (ref 30.0–36.0)
MCV: 91.1 fl (ref 78.0–100.0)
Monocytes Absolute: 0.5 10*3/uL (ref 0.1–1.0)
Monocytes Relative: 8.1 % (ref 3.0–12.0)
Neutro Abs: 4.4 10*3/uL (ref 1.4–7.7)
Neutrophils Relative %: 68.5 % (ref 43.0–77.0)
Platelets: 160 10*3/uL (ref 150.0–400.0)
RBC: 4.73 Mil/uL (ref 4.22–5.81)
RDW: 13.7 % (ref 11.5–15.5)
WBC: 6.4 10*3/uL (ref 4.0–10.5)

## 2021-03-26 NOTE — Telephone Encounter (Addendum)
I ordered blood work and urine test.  If they can come today they can go away to the lab.  If he gets worse he needs to be evaluated in person.

## 2021-03-26 NOTE — Telephone Encounter (Signed)
Spoke with patient today. 

## 2021-03-27 LAB — URINE CULTURE

## 2021-03-27 MED ORDER — AMOXICILLIN-POT CLAVULANATE 875-125 MG PO TABS
1.0000 | ORAL_TABLET | Freq: Two times a day (BID) | ORAL | 0 refills | Status: DC
  Filled 2021-03-27: qty 14, 7d supply, fill #0

## 2021-03-29 ENCOUNTER — Other Ambulatory Visit (HOSPITAL_BASED_OUTPATIENT_CLINIC_OR_DEPARTMENT_OTHER): Payer: Self-pay

## 2021-03-29 ENCOUNTER — Telehealth: Payer: Self-pay | Admitting: Internal Medicine

## 2021-03-29 NOTE — Telephone Encounter (Signed)
[facility] FYI...   Caller states her father was having confusion, and her father had blood work and a urine test yesterday. It showed an UTI. He was sent in an antibiotic to a pharmacy that is closed. Unsure of the name of antibiotic. They are wanting it resent to [facility] [phone]). History of non-ruptured aneurysm.   Advised to call PCP now. Call was sent to on-call provider. Dr. Alysia Penna sent in 7 day supply for patient.

## 2021-04-05 ENCOUNTER — Telehealth: Payer: Self-pay | Admitting: Internal Medicine

## 2021-04-05 ENCOUNTER — Telehealth: Payer: Self-pay | Admitting: Physician Assistant

## 2021-04-05 NOTE — Telephone Encounter (Signed)
Pts wife called to report that the pt has been having excessive sleepiness and confusion since he came to see [place] to have his stitches taken out 03/23/21 from his device.   He is not having any slurred speech, no weakness on one side, but he has been doubling up on his seizure med in error due to forgetting when he takes his meds.. he has a h/o brain aneurysm/ shunt and clip in place... he is seeing his Neurologist this week 04/08/21.. she is requesting to have the pt see [name] PA since she felt good when she has seen her  in the past and how thorough she is and wanted to see her again to be sure that he is seen by all of his specialists to rule out any possible causes but I advised her that her schedule is full for several weeks... so she agreed to have the pt see Laurann Montana NP this Friday but will cancel if needed after he sees the neurologist.   Pts wife also asking to see if we can check his device to be sure that nothing wrong with the programming that can be adding to his symptoms since his symptoms started shortly after his procedure. I advised her that I will forward to the device nurses for review and follow up.

## 2021-04-05 NOTE — Telephone Encounter (Signed)
New message:     Patient wife calling to speak with Ivy. Patient just had a new pacemaker. Patient only would like to speak with Ivy.

## 2021-04-05 NOTE — Telephone Encounter (Signed)
Patient wife called on behalf of patient. Stated he has been taking the amoxicillin-clavulanate (AUGMENTIN) 875-125 MG tablet but it has not helped.Per wife the patient has still been very confused and not like himself.  Seeking advise on next steps. Patient is scheduled to see the neurologist on Thursday, 04/08/2021 and wanted PCP to be aware.   Requesting a callback to further discuss

## 2021-04-05 NOTE — Telephone Encounter (Signed)
Spoke with pt. Wife and daughter, pt sleeping at time of call.    Assisted with sending Manual transmission from my [facility].    Transmission reviewed, normal device function.  No arrythmia logged, nothing on report to explain pt symptoms.

## 2021-04-05 NOTE — Telephone Encounter (Signed)
There is nothing in his blood work to suggest an infection.  Depending on other symptoms besides confusion that would point [place] in the right direction as far as a lung infection or if he had systemic symptoms of GI infection.  If there is no other symptoms I do not think it is an infection.  I would not think about side effects from the medication more possibly something neurologic.  I am glad he is seeing neurology this week.

## 2021-04-06 NOTE — Telephone Encounter (Signed)
Patient's wife also sent my-chart message and it has been read and reviewed. This included Dr. Quay Burow response regarding patient.

## 2021-04-07 ENCOUNTER — Telehealth: Payer: Self-pay | Admitting: Internal Medicine

## 2021-04-07 ENCOUNTER — Other Ambulatory Visit: Payer: Self-pay

## 2021-04-07 DIAGNOSIS — R300 Dysuria: Secondary | ICD-10-CM

## 2021-04-07 DIAGNOSIS — Z982 Presence of cerebrospinal fluid drainage device: Secondary | ICD-10-CM | POA: Diagnosis not present

## 2021-04-07 DIAGNOSIS — I671 Cerebral aneurysm, nonruptured: Secondary | ICD-10-CM | POA: Diagnosis not present

## 2021-04-07 NOTE — Telephone Encounter (Signed)
Spouse calling to request order for urine sensitivity  test prior to 10/7 appointment

## 2021-04-07 NOTE — Telephone Encounter (Signed)
Ordered and patient contacted.

## 2021-04-08 ENCOUNTER — Other Ambulatory Visit: Payer: Self-pay

## 2021-04-08 ENCOUNTER — Other Ambulatory Visit: Payer: Medicare Other

## 2021-04-08 DIAGNOSIS — R419 Unspecified symptoms and signs involving cognitive functions and awareness: Secondary | ICD-10-CM | POA: Diagnosis not present

## 2021-04-08 DIAGNOSIS — R300 Dysuria: Secondary | ICD-10-CM | POA: Diagnosis not present

## 2021-04-08 DIAGNOSIS — R410 Disorientation, unspecified: Secondary | ICD-10-CM | POA: Diagnosis not present

## 2021-04-08 DIAGNOSIS — Z87891 Personal history of nicotine dependence: Secondary | ICD-10-CM | POA: Diagnosis not present

## 2021-04-08 DIAGNOSIS — Z91199 Patient's noncompliance with other medical treatment and regimen due to unspecified reason: Secondary | ICD-10-CM | POA: Diagnosis not present

## 2021-04-08 DIAGNOSIS — G40109 Localization-related (focal) (partial) symptomatic epilepsy and epileptic syndromes with simple partial seizures, not intractable, without status epilepticus: Secondary | ICD-10-CM | POA: Diagnosis not present

## 2021-04-08 DIAGNOSIS — R413 Other amnesia: Secondary | ICD-10-CM | POA: Diagnosis not present

## 2021-04-08 DIAGNOSIS — G4733 Obstructive sleep apnea (adult) (pediatric): Secondary | ICD-10-CM | POA: Diagnosis not present

## 2021-04-08 DIAGNOSIS — Q2730 Arteriovenous malformation, site unspecified: Secondary | ICD-10-CM | POA: Diagnosis not present

## 2021-04-08 DIAGNOSIS — G40209 Localization-related (focal) (partial) symptomatic epilepsy and epileptic syndromes with complex partial seizures, not intractable, without status epilepticus: Secondary | ICD-10-CM | POA: Diagnosis not present

## 2021-04-08 DIAGNOSIS — Z79899 Other long term (current) drug therapy: Secondary | ICD-10-CM | POA: Diagnosis not present

## 2021-04-08 DIAGNOSIS — I608 Other nontraumatic subarachnoid hemorrhage: Secondary | ICD-10-CM | POA: Diagnosis not present

## 2021-04-08 DIAGNOSIS — R41841 Cognitive communication deficit: Secondary | ICD-10-CM | POA: Diagnosis not present

## 2021-04-08 LAB — URINALYSIS, ROUTINE W REFLEX MICROSCOPIC
Bilirubin Urine: NEGATIVE
Hgb urine dipstick: NEGATIVE
Ketones, ur: NEGATIVE
Leukocytes,Ua: NEGATIVE
Nitrite: NEGATIVE
RBC / HPF: NONE SEEN (ref 0–?)
Specific Gravity, Urine: <=1.005 — AB (ref 1.000–1.030)
Total Protein, Urine: NEGATIVE
Urine Glucose: NEGATIVE
Urobilinogen, UA: 0.2 (ref 0.0–1.0)
pH: 6 (ref 5.0–8.0)

## 2021-04-08 NOTE — Progress Notes (Signed)
Subjective:    Patient ID: Jeff Weaver, male    DOB: 06/29/1946, 75 y.o.   MRN: [id number]  This visit occurred during the SARS-CoV-2 public health emergency.  Safety protocols were in place, including screening questions prior to the visit, additional usage of staff PPE, and extensive cleaning of exam room while observing appropriate contact time as indicated for disinfecting solutions.    HPI The patient is here for an acute visit.  He is here with his wife and daughter.  They provide most of the history because of previous memory issues and confusion  He has had some increased confusion and not feeling well for approximately 1-2 weeks.  It is difficult to know exactly when everything started.  He will often say that he does not feel well, but cannot tell his family exactly what symptoms he has or what that means.  There is some obvious confusion.  The other day he did not pick up his sandwich at lunch to eat it, but toward a party made it that way which he is never done.  Today in the office he tried to put his mask on his foot instead of his sock back on his foot.  There has been several instances of that.  He has been weaker-having more difficulty getting up and down from a chair, his balance is worse, he is sleeping more than usual and he has decreased appetite.  He has lost some weight because of not eating as much.    He saw neurology yesterday - neurosurgery ordered a CT scan and shunt series.  Everything looks okay, but they want to get an MRI and EEG.  That will take a while to get.  They wanted other issues ruled out like an infection, which is why he is here today.     Medications and allergies reviewed with patient and updated if appropriate.  Patient Active Problem List   Diagnosis Date Noted   Right arm pain 01/06/2021   Head injury 11/11/2020   Abrasion of leg with infection, left, initial encounter 10/28/2020   Seizures ([place])    Muscle cramping 12/30/2019    Dizziness 12/30/2019   Unsteady gait 12/30/2019   Pacemaker 11/27/2019   Fatigue 07/09/2019   SSS (sick sinus syndrome) ([place]) 07/02/2018   Bilateral shoulder pain 06/14/2018   Upper airway cough syndrome vs cough variant asthma 03/20/2018   Acute midline low back pain without sciatica 12/25/2017   Depression 10/17/2017   CAD (coronary artery disease), hx of NSTEMI 08/15/2017   Chest pain 07/17/2017   SOBOE (shortness of breath on exertion) 06/08/2017   Bilateral sensorineural hearing loss 06/01/2017   Nasal turbinate hypertrophy 06/01/2017   Dysphagia 05/31/2017   Osteoporosis 01/30/2017   History of cerebral hemorrhage 12/28/2016   Seborrheic dermatitis 07/25/2016   Cognitive communication deficit 06/28/2016   Hypertensive heart disease 06/28/2016   Vitamin B 12 deficiency 03/16/2016   Memory difficulties 03/13/2016   VP (ventriculoperitoneal) shunt status 02/12/2016   Orthostatic hypotension 01/05/2016   Benign prostatic hyperplasia with urinary obstruction 08/04/2014   Urinary urgency 08/04/2014   Gastroesophageal reflux disease 07/14/2014   Polymyalgia rheumatica ([place]) 05/28/2014   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/2014   SBE (subacute bacterial endocarditis) prophylaxis candidate 11/24/2013   Aphasia 07/17/2013   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 seizures, not intractable, without status epilepticus ([place]) 07/17/2013   Subdural hemorrhage ([place]) 07/17/2013   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/17/2013   Pre-diabetes 03/26/2013   Increased  frequency of urination 10/31/2012   Nocturia 10/31/2012   History of cerebral aneurysm repair 10/18/2012   History of stroke without residual deficits 10/18/2012   Blepharitis of both eyes 09/27/2012   Ocular rosacea 09/27/2012   Seizure disorder ([place]) 07/23/2012   Entropion 07/10/2012   Hyperopia with astigmatism 07/10/2012   Combined senile cataract 04/13/2012   Presbyopia 04/13/2012   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     amoxicillin (AMOXIL) 500 MG capsule Take 2,000 mg by mouth as directed. Take 2000 mg by mouth 1 hour prior to dental appointments      aspirin 325 MG EC tablet Take 325-650 mg by mouth daily as needed for pain.     aspirin EC 81 MG tablet Take 1 tablet (81 mg total) by mouth daily. (Patient taking differently: Take 81 mg by mouth in the morning.) 90 tablet 3   cetirizine (ZYRTEC) 10 MG tablet Take 1 tablet (10 mg total) by mouth daily. (Patient taking differently: Take 10 mg by mouth in the morning.) 30 tablet 11   Cholecalciferol (VITAMIN D) 50 MCG (2000 UT) CAPS Take 2,000 Units by mouth in the morning.     clonazePAM (KLONOPIN) 1 MG disintegrating tablet Take 1 mg by mouth as directed. ALLOW 1 TABLET TO DISSOLVE IN CHEEK FOR SEIZURES LASTING LONGER THAN 4 MINUTES. OR AFTER 2ND SEIZURE IN 24 HOURS     cyanocobalamin 1000 MCG tablet Take 1 tablet by mouth daily.     desonide (DESOWEN) 0.05 % ointment Apply 1 application topically 2 (two) times daily as needed (skin irritation).     fluticasone (FLONASE) 50 MCG/ACT nasal spray Place 1-2 sprays into both nostrils daily as needed for allergies.     furosemide (LASIX) 40 MG tablet TAKE 1 TABLET (40 MG TOTAL) BY MOUTH DAILY. (Patient taking differently: Take 40 mg by mouth in the morning.) 90 tablet 3   levETIRAcetam (KEPPRA) 500 MG tablet Take 1,000-1,500 mg by mouth See admin instructions. Take 2 tablets (1000 mg) by mouth in the morning  & take 3 tablets (1500 mg) by mouth in the evening     LORazepam (ATIVAN) 1 MG tablet Take 1 mg by mouth every 6 (six) hours as needed for seizure.      menthol-zinc oxide (GOLD BOND) powder Apply 1 application topically 2 (two) times daily as needed (itching/irritation).     miconazole (ZEASORB-AF) 2 % powder Apply 1 application topically as needed for itching.     Midazolam (NAYZILAM) 5 MG/0.1ML SOLN Place 1 each into the nose as needed (seizure).     mirabegron ER (MYRBETRIQ) 50 MG TB24 tablet Take 1 tablet (50 mg total) by mouth daily. (Patient taking differently: Take 50 mg by mouth every evening.) 90 tablet 1   nitroGLYCERIN (NITROSTAT) 0.4 MG SL tablet PLACE 1 TABLET (0.4 MG TOTAL) UNDER THE TONGUE EVERY 5 (FIVE) MINUTES AS NEEDED FOR CHEST PAIN. (Patient taking differently: Place 0.4 mg under the tongue every 5 (five) minutes x 3 doses as needed for chest pain.) 25  tablet 7   NON FORMULARY CPAP Machine: authorized by [place] in [place]      omeprazole (PRILOSEC) 40 MG capsule TAKE 1 CAPSULE (40 MG TOTAL) BY MOUTH DAILY. (Patient taking differently: Take 40 mg by mouth daily as needed (heartburn/indigestion.).) 90 capsule 2   predniSONE (DELTASONE) 10 MG tablet Take 6 tablets by mouth daily for 1 day then 5 tabs daily x 1 day then 4 tabs x 1 day 3 tabs x 1 day 2 tabs x 1 day 1 tab x 1 day then stop 21 tablet 0   Propylene Glycol (SYSTANE BALANCE) 0.6 % SOLN Apply 1 drop to eye daily as needed (dry eyes).     pyridOXINE (VITAMIN B-6) 100 MG tablet Take 100 mg by mouth in the morning.     rosuvastatin (CRESTOR) 5 MG tablet TAKE 1 TABLET BY MOUTH THREE TIMES A WEEK AND INCREASE AS TOLERATED (Patient taking differently: Take 5 mg by mouth every Monday, Wednesday, and Friday. In the evening) 90 tablet 3   Tamsulosin HCl (FLOMAX) 0.4 MG CAPS Take 0.4 mg by mouth every evening.     thiamine 100 MG tablet Take 100 mg by mouth in the morning.     tolnaftate (TINACTIN) 1 % spray Apply topically 2 (two) times  daily as needed (itching).     VIMPAT 200 MG TABS tablet Take 200 mg by mouth 2 (two) times daily.     vitamin B-12 (CYANOCOBALAMIN) 1000 MCG tablet Take 1,000 mcg by mouth in the morning.     No current facility-administered medications on file prior to visit.    Past Medical History:  Diagnosis Date   Aortic insufficiency    a. mild 2017 echo.   Atypical chest pain    Cerebral aneurysm    a. s/p clipping [facility] in 8/13 c/b short-term memory loss, cerebral hemorrhage and seizure disorder s/p VP shunt.   Cerebral hemorrhage (HCC)    Chronic diastolic CHF (congestive heart failure) (HCC)    CVA (cerebral infarction)    Diverticulosis    Enteritis (regional)    Dr Olevia Perches   First degree AV block    GI bleed    HTN (hypertension)    Hyperlipidemia    Hypertensive heart disease    IBS (irritable bowel syndrome)    Mild CAD    Cardiac cath 01/12/18 showed minimal nonobstructive CAD, normal LVEF, normal LVEDP.   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 s/p PPM 07/02/18.    Past Surgical History:  Procedure Laterality Date   BIV PACEMAKER INSERTION CRT-P N/A 03/10/2021   Procedure: BIV PACEMAKER INSERTION CRT-P UPGRADE;  Surgeon: Constance Haw, MD;  Location: [facility];  Service: Cardiovascular;  Laterality: N/A;   CHOLECYSTECTOMY N/A 01/07/2016   Procedure: LAPAROSCOPIC CHOLECYSTECTOMY ;  Surgeon: Coralie Keens, MD;  Location: [place];  Service: General;  Laterality: N/A;   cns shunt  02/23/12   COLONOSCOPY W/ POLYPECTOMY  1978   negative 2009, Dr Olevia Perches. Due 2014   CRANIOTOMY  02/02/12    Dr Harvel Ricks, [place] of aneurysm   CRANIOTOMY  02-02-12   left pterional craniotomy for clipping complex anterior communicating artery aneurysm    HERNIA REPAIR     LEFT HEART CATH AND CORONARY ANGIOGRAPHY N/A 01/12/2018   Procedure: LEFT HEART CATH AND CORONARY ANGIOGRAPHY;  Surgeon: Martinique, Peter M, MD;  Location: [facility];  Service: Cardiovascular;  Laterality: N/A;   LEFT HEART CATHETERIZATION WITH CORONARY ANGIOGRAM N/A 11/26/2013   Procedure: LEFT HEART CATHETERIZATION WITH CORONARY ANGIOGRAM;  Surgeon: [name], MD;  Location: [facility];  Service: Cardiovascular;  Laterality: N/A;   NOSE SURGERY     PACEMAKER IMPLANT N/A 07/02/2018   Procedure: PACEMAKER IMPLANT;  Surgeon: Constance Haw, MD;  Location: [place];  Service: Cardiovascular;  Laterality: N/A;   SHOULDER SURGERY  1997   TONSILLECTOMY AND ADENOIDECTOMY     VENTRICULOPERITONEAL SHUNT  02-23-12   INSERTION OF RIGHT FRONTAL VENTRICULOPERITONEAL SHUNT WITH A CODMAN HAKIM PROGRAMMABLE VALVE    Social History   Socioeconomic History   Marital status: Married    Spouse name: Not on file   Number of children: 1   Years of education: Not on file   Highest education level: Not on file  Occupational History   Occupation: retired/ Education officer, community  Tobacco Use   Smoking status: Former    Packs/day: 0.00    Years: 30.00    Pack years: 0.00    Types: Pipe, Cigarettes    Quit date: 07/04/2008    Years since quitting: 12.7   Smokeless tobacco: Former  Scientific laboratory technician Use: Never used  Substance and Sexual Activity   Alcohol use: Yes    Alcohol/week: 0.0 standard drinks    Comment: Very Infrequently    Drug use: No   Sexual activity: Not Currently  Other Topics Concern   Not on file  Social History Narrative   Not on file   Social Determinants of Health   Financial Resource Strain: Not on file  Food Insecurity: Not on file  Transportation Needs: Not on file  Physical Activity: Not on  file  Stress: Not on file  Social Connections: Not on file    Family History  Problem Relation Age of Onset   COPD Father    Coronary artery disease Father        MI in 19s   Hepatitis Mother    Diabetes Sister    Dementia Sister 33   Diabetes Brother    Colon cancer Neg Hx    Esophageal cancer Neg Hx    Rectal cancer Neg Hx    Stomach cancer Neg Hx    Colon polyps Neg Hx     Review of Systems  Constitutional:  Positive for appetite change and fatigue. Negative for chills and fever.  Eyes:  Positive for visual disturbance (not able to read as well since cataract surgery).  Respiratory:  Positive for cough (more than usual) and shortness of breath (some - ? worse or not).   Cardiovascular:  Positive for chest pain (occ) and leg swelling (minimal). Negative for palpitations.  Gastrointestinal:  Negative for abdominal pain and diarrhea.  Endocrine: Positive for cold intolerance.  Genitourinary:  Negative for dysuria and hematuria.       No urine odor   Skin:  Positive for color change (Red marks on right foot).  Allergic/Immunologic: Positive for food allergies.  Neurological:  Positive for headaches (occ, mild).  Psychiatric/Behavioral:  Positive for confusion and sleep disturbance (sleeping more).       Objective:   Vitals:   04/09/21 0836  BP: 130/78  Pulse: 60  Temp: 98.2 F (36.8 C)  SpO2: 97%   BP Readings from Last 3 Encounters:  04/09/21 130/78  03/10/21 (!) 157/66  01/06/21 134/72   Wt Readings from Last 3 Encounters:  04/09/21 242 lb (109.8 kg)  03/10/21 241 lb (109.3 kg)  01/06/21 245 lb 6.4 oz (111.3 kg)   Body mass index is 36.8 kg/m.   Physical Exam    Constitutional: Appears well-developed and well-nourished. No distress.  Head: Normocephalic and atraumatic.  Neck: Neck supple. No tracheal deviation present. No thyromegaly present.  No cervical lymphadenopathy Cardiovascular: Normal rate, regular rhythm and normal heart sounds.  No murmur  heard. No carotid bruit .  No edema Pulmonary/Chest: Effort normal and breath sounds normal. No respiratory distress. No has no wheezes. No rales.  Skin: Skin is warm and dry. Not diaphoretic.  He has some red lesions that are flat on the medial and a couple on the lateral aspect of his foot.  They go in a line.  There is no pain that he did not itch.?  Related to shoe he was wearing.  They do not look concerning. Psychiatric: Normal mood and affect.  Some confusion seen in the office-as stated above he tried to put his mask on his foot instead of his sock on his foot.  Sometimes when he was talking he would start on 1 topic and end up going into another topic thinking he was talking about all the same thing   Lab Results  Component Value Date   WBC 6.4 03/26/2021   HGB 14.7 03/26/2021   HCT 43.1 03/26/2021   PLT 160.0 03/26/2021   GLUCOSE 124 (H) 03/26/2021   CHOL 135 11/09/2020   TRIG 81 11/09/2020   HDL 46 11/09/2020   LDLDIRECT 87.6 09/19/2012   LDLCALC 73 11/09/2020   ALT 28 03/26/2021   AST 21 03/26/2021   NA 138 03/26/2021   K 3.6 03/26/2021   CL 99 03/26/2021   CREATININE 0.99 03/26/2021   BUN 13 03/26/2021   CO2 33 (H) 03/26/2021   TSH 1.240 11/09/2020   INR 1.0 07/17/2017   HGBA1C 6.4 12/30/2019   MICROALBUR 1.0 11/13/2014     Reviewed CT scan and x-ray done by neurology yesterday   Assessment & Plan:    I spent 30 minutes dedicated to the care of this patient on the date of this encounter including review of recent labs, imaging and procedures, speciality notes, obtaining history, communicating with the patient, ordering medications, tests, and documenting clinical information in the EHR     See Problem List for Assessment and Plan of chronic medical problems.

## 2021-04-09 ENCOUNTER — Ambulatory Visit (INDEPENDENT_AMBULATORY_CARE_PROVIDER_SITE_OTHER): Payer: Medicare Other | Admitting: Family

## 2021-04-09 ENCOUNTER — Encounter (HOSPITAL_BASED_OUTPATIENT_CLINIC_OR_DEPARTMENT_OTHER): Payer: Self-pay | Admitting: Family

## 2021-04-09 ENCOUNTER — Ambulatory Visit (INDEPENDENT_AMBULATORY_CARE_PROVIDER_SITE_OTHER): Payer: Medicare Other

## 2021-04-09 ENCOUNTER — Encounter: Payer: Self-pay | Admitting: Internal Medicine

## 2021-04-09 ENCOUNTER — Ambulatory Visit (INDEPENDENT_AMBULATORY_CARE_PROVIDER_SITE_OTHER): Payer: Medicare Other | Admitting: Internal Medicine

## 2021-04-09 VITALS — BP 110/72 | HR 59 | Ht 68.0 in | Wt 242.0 lb

## 2021-04-09 VITALS — BP 130/78 | HR 60 | Temp 98.2°F | Ht 68.0 in | Wt 242.0 lb

## 2021-04-09 DIAGNOSIS — R413 Other amnesia: Secondary | ICD-10-CM

## 2021-04-09 DIAGNOSIS — R7303 Prediabetes: Secondary | ICD-10-CM

## 2021-04-09 DIAGNOSIS — I10 Essential (primary) hypertension: Secondary | ICD-10-CM | POA: Diagnosis not present

## 2021-04-09 DIAGNOSIS — E559 Vitamin D deficiency, unspecified: Secondary | ICD-10-CM

## 2021-04-09 DIAGNOSIS — E538 Deficiency of other specified B group vitamins: Secondary | ICD-10-CM

## 2021-04-09 DIAGNOSIS — I5020 Unspecified systolic (congestive) heart failure: Secondary | ICD-10-CM | POA: Diagnosis not present

## 2021-04-09 DIAGNOSIS — D518 Other vitamin B12 deficiency anemias: Secondary | ICD-10-CM | POA: Diagnosis not present

## 2021-04-09 DIAGNOSIS — M353 Polymyalgia rheumatica: Secondary | ICD-10-CM | POA: Diagnosis not present

## 2021-04-09 DIAGNOSIS — I25118 Atherosclerotic heart disease of native coronary artery with other forms of angina pectoris: Secondary | ICD-10-CM | POA: Diagnosis not present

## 2021-04-09 DIAGNOSIS — Z95810 Presence of automatic (implantable) cardiac defibrillator: Secondary | ICD-10-CM | POA: Diagnosis not present

## 2021-04-09 DIAGNOSIS — R059 Cough, unspecified: Secondary | ICD-10-CM

## 2021-04-09 DIAGNOSIS — E519 Thiamine deficiency, unspecified: Secondary | ICD-10-CM

## 2021-04-09 DIAGNOSIS — R5383 Other fatigue: Secondary | ICD-10-CM

## 2021-04-09 DIAGNOSIS — I2510 Atherosclerotic heart disease of native coronary artery without angina pectoris: Secondary | ICD-10-CM

## 2021-04-09 DIAGNOSIS — J9811 Atelectasis: Secondary | ICD-10-CM | POA: Diagnosis not present

## 2021-04-09 DIAGNOSIS — R410 Disorientation, unspecified: Secondary | ICD-10-CM

## 2021-04-09 DIAGNOSIS — E74818 Other disorders of glucose transport: Secondary | ICD-10-CM | POA: Diagnosis not present

## 2021-04-09 NOTE — Progress Notes (Signed)
Office Visit    Patient Name: Jeff Weaver Date of Encounter: 04/09/2021  PCP:  [name], MD   [place]  Cardiologist:  Fransico Him, MD  Advanced Practice Provider:  No care team member to display Electrophysiologist:  Will Meredith Leeds, MD     Chief Complaint    Jeff Weaver is a 75 y.o. male with a hx of obesity, HFrEF, orthostasis, HLD, prediabetes, cerebral aneurysm post clipping, seizure disorder s/p VP shunt, OSA, aortic insufficiency, junctional bradycarida s/p PPM 2019 and biV pacer upgrade 03/10/21 presents today for confusion.   Past Medical History    Past Medical History:  Diagnosis Date   Aortic insufficiency    a. mild 2017 echo.   Atypical chest pain    Cerebral aneurysm    a. s/p clipping at [facility] in 8/13 c/b short-term memory loss, cerebral hemorrhage and seizure disorder s/p VP shunt.   Cerebral hemorrhage (HCC)    Chronic diastolic CHF (congestive heart failure) (HCC)    CVA (cerebral infarction)    Diverticulosis    Enteritis (regional)    Dr Olevia Perches   First degree AV block    GI bleed    HTN (hypertension)    Hyperlipidemia    Hypertensive heart disease    IBS (irritable bowel syndrome)    Mild CAD    Cardiac cath 01/12/18 showed minimal nonobstructive CAD, normal LVEF, normal LVEDP.   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 s/p PPM 07/02/18.   Past Surgical History:  Procedure Laterality Date   BIV PACEMAKER INSERTION CRT-P N/A 03/10/2021   Procedure: BIV PACEMAKER INSERTION CRT-P UPGRADE;  Surgeon: Constance Haw, MD;  Location:  [facility];  Service: Cardiovascular;  Laterality: N/A;   CHOLECYSTECTOMY N/A 01/07/2016   Procedure: LAPAROSCOPIC CHOLECYSTECTOMY ;  Surgeon: Coralie Keens, MD;  Location: [facility];  Service: General;  Laterality: N/A;   cns shunt  02/23/12   COLONOSCOPY W/ POLYPECTOMY  1978   negative 2009, Dr Olevia Perches. Due 2014   CRANIOTOMY  02/02/12   Dr Harvel Ricks, [place] of aneurysm   CRANIOTOMY  02-02-12   left pterional craniotomy for clipping complex anterior communicating artery aneurysm    HERNIA REPAIR     LEFT HEART CATH AND CORONARY ANGIOGRAPHY N/A 01/12/2018   Procedure: LEFT HEART CATH AND CORONARY ANGIOGRAPHY;  Surgeon: Martinique, Peter M, MD;  Location: [facility];  Service: Cardiovascular;  Laterality: N/A;   LEFT HEART CATHETERIZATION WITH CORONARY ANGIOGRAM N/A 11/26/2013   Procedure: LEFT HEART CATHETERIZATION WITH CORONARY ANGIOGRAM;  Surgeon: [name], MD;  Location: [facility];  Service: Cardiovascular;  Laterality: N/A;   NOSE SURGERY     PACEMAKER IMPLANT N/A 07/02/2018   Procedure: PACEMAKER IMPLANT;  Surgeon: Constance Haw, MD;  Location: [facility];  Service: Cardiovascular;  Laterality: N/A;   SHOULDER SURGERY  1997   TONSILLECTOMY AND ADENOIDECTOMY     VENTRICULOPERITONEAL SHUNT  02-23-12   INSERTION OF RIGHT  FRONTAL VENTRICULOPERITONEAL SHUNT WITH A CODMAN HAKIM PROGRAMMABLE VALVE    Allergies  Allergies  Allergen Reactions   Haloperidol Rash and Other (See Comments)    Hallucinations, agitation    Rosuvastatin     Leg pain mainly in knees Other reaction(s): Generalized aches and pains (currently tolerates in low doses)   Simvastatin Nausea And Vomiting    2007: nausea & vomiting ; [facility] ( he does not remember such)   Lisinopril Cough   Nifedipine Swelling    Thought to contribute to lower extremity edema    Pravastatin     Rxed by [facility] 2009; "myositis" Ok to take per pt's wife/Dr. Linna Darner Other reaction(s): Myositis    History of  Present Illness    Jeff Weaver is a 75 y.o. male with a hx of obesity, HFrEF, orthostasis, HLD, prediabetes, cerebral aneurysm post clipping, seizure disorder s/p VP shunt, OSA, aortic insufficiency, junctional bradycarida s/p PPM 2019 and biV pacer upgrade 03/10/21 last seen 03/23/21.  03/10/21 he had PPM upgraded to biV pacer with Dr. Curt Bears due to heart failure. 03/23/21 seen by Oda Kilts 03/23/21 for wound check appointment and steri strips removed. Contacted the office 04/05/21 noting confusion and sleepiness. Remote device check 04/05/21 with normal device function and no arrhythmia.   Saw neurology yesterday. They ordered CT scan shunt series per patient and family report which looked okay. He has MRI and EEG upcoming. Saw primary care provider this morning. CXR, CBC, CMP, TSH, A1c, B12, sed rate, CROP, vitamin D, vitamin B were ordered.   Presents today for follow-up with his wife and daughter.  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  History assisted by them as well. Tells me the confusion started all of a sudden about 1 week ago.  Wife shares with me that he often can tell her that he does not feel good but cannot quite articulate why.  We reviewed echocardiogram with reduced LVEF, treatment modalities for heart failure, and the indication for biventricular pacing.  He still has low energy level and they were hoping this would improve more quickly.  He did previously take Entresto for 1 week but his wife tells me that he had an episode where he was sitting in the chair and then started to sit on his knees and felt he just needed to lay down.  Unclear whether this was due to [facility] or not.  Reports persistent lower extremity edema mostly in his feet and ankles.  EKGs/Labs/Other Studies Reviewed:   The following studies were reviewed today:  Echocardiogram 11/24/2020 1. Left ventricular ejection fraction, by estimation, is 35% with  hypokinesis of the  inferoseptal, inferior , mid/distal anterior, distal  lateral and apical walls . The left ventricle has moderately decreased  function. There is moderate left ventricular   hypertrophy. Left ventricular diastolic parameters are consistent with  Grade I diastolic dysfunction (impaired relaxation). Elevated left atrial  pressure. Compared to echo report from 2017, LVEF is worse   2. Right ventricular systolic function is low normal. The right  ventricular size is normal.   3. The mitral valve is normal in structure. No evidence of mitral valve  regurgitation.   4. The aortic valve is normal in structure. Aortic valve regurgitation is  mild.   5. Aortic dilatation noted. There is mild dilatation of the aortic root,  measuring 41 mm.   Chest x-ray 04/09/2021   FINDINGS: Left subclavian biventricular pacemaker leads are unchanged,  projecting over the right atrium, right ventricle and coronary sinus. The heart size and mediastinal contours are stable with mild aortic atherosclerosis.   There are persistent low lung volumes with associated bibasilar atelectasis. No confluent airspace opacity, edema, pneumothorax or significant pleural effusion identified. Probable ventricular peritoneal shunt over the left chest again noted. The bones appear unchanged. Possible loose body about the right shoulder.   IMPRESSION: Stable radiographic appearance of the chest with associated bibasilar atelectasis. No acute findings identified.  EKG: No EKG today the  Recent Labs: 11/09/2020: Magnesium 1.9; NT-Pro BNP 430; TSH 1.240 03/26/2021: ALT 28; BUN 13; Creatinine, Ser 0.99; Hemoglobin 14.7; Platelets 160.0; Potassium 3.6; Sodium 138  Recent Lipid Panel    Component Value Date/Time   CHOL 135 11/09/2020 1011   CHOL 159 03/27/2013 0818   TRIG 81 11/09/2020 1011   TRIG 127 03/27/2013 0818   HDL 46 11/09/2020 1011   HDL 45 03/27/2013 0818   CHOLHDL 2.9 11/09/2020 1011   CHOLHDL 3 04/18/2018 1023    VLDL 26.2 04/18/2018 1023   LDLCALC 73 11/09/2020 1011   LDLCALC 89 03/27/2013 0818   LDLDIRECT 87.6 09/19/2012 1206   Home Medications   Current Meds  Medication Sig   acetaminophen (TYLENOL) 325 MG tablet Take 325-650 mg by mouth every 6 (six) hours as needed (pain/headaches.).   amoxicillin (AMOXIL) 500 MG capsule Take 2,000 mg by mouth as directed. Take 2000 mg by mouth 1 hour prior to dental appointments    aspirin 325 MG EC tablet Take 325-650 mg by mouth daily as needed for pain.   aspirin EC 81 MG tablet Take 1 tablet (81 mg total) by mouth daily. (Patient taking differently: Take 81 mg by mouth in the morning.)   cetirizine (ZYRTEC) 10 MG tablet Take 1 tablet (10 mg total) by mouth daily. (Patient taking differently: Take 10 mg by mouth in the morning.)   Cholecalciferol (VITAMIN D) 50 MCG (2000 UT) CAPS Take 2,000 Units by mouth in the morning.   clonazePAM (KLONOPIN) 1 MG disintegrating tablet Take 1 mg by mouth as directed. ALLOW 1 TABLET TO DISSOLVE IN CHEEK FOR SEIZURES LASTING LONGER THAN 4 MINUTES. OR AFTER 2ND SEIZURE IN 24 HOURS   cyanocobalamin 1000 MCG tablet Take 1 tablet by mouth daily.   desonide (DESOWEN) 0.05 % ointment Apply 1 application topically 2 (two) times daily as needed (skin irritation).   fluticasone (FLONASE) 50 MCG/ACT nasal spray Place 1-2 sprays into both nostrils daily as needed for allergies.   furosemide (LASIX) 40 MG tablet TAKE 1 TABLET (40 MG TOTAL) BY MOUTH DAILY. (Patient taking differently: Take 40 mg by mouth in the morning.)   levETIRAcetam (KEPPRA) 500 MG tablet Take 1,000-1,500 mg by mouth See admin instructions. Take 2 tablets (1000 mg) by mouth in the morning & take 3 tablets (1500 mg) by mouth in the evening   LORazepam (ATIVAN) 1 MG tablet Take 1 mg by mouth every 6 (six) hours as needed for seizure.    menthol-zinc oxide (GOLD BOND) powder Apply 1 application topically 2 (two) times daily as needed (itching/irritation).   miconazole  (ZEASORB-AF) 2 % powder Apply 1 application topically as needed for itching.   Midazolam (NAYZILAM) 5 MG/0.1ML SOLN Place 1 each into the nose as needed (seizure).   mirabegron ER (MYRBETRIQ) 50 MG TB24 tablet Take 1 tablet (50 mg total) by mouth daily. (Patient taking differently: Take 50 mg by mouth every evening.)   nitroGLYCERIN (NITROSTAT) 0.4 MG SL tablet  PLACE 1 TABLET (0.4 MG TOTAL) UNDER THE TONGUE EVERY 5 (FIVE) MINUTES AS NEEDED FOR CHEST PAIN. (Patient taking differently: Place 0.4 mg under the tongue every 5 (five) minutes x 3 doses as needed for chest pain.)   NON FORMULARY CPAP Machine: authorized by [place] in [place]    omeprazole (PRILOSEC) 40 MG capsule TAKE 1 CAPSULE (40 MG TOTAL) BY MOUTH DAILY. (Patient taking differently: Take 40 mg by mouth daily as needed (heartburn/indigestion.).)   Propylene Glycol (SYSTANE BALANCE) 0.6 % SOLN Apply 1 drop to eye daily as needed (dry eyes).   pyridOXINE (VITAMIN B-6) 100 MG tablet Take 100 mg by mouth in the morning.   rosuvastatin (CRESTOR) 5 MG tablet TAKE 1 TABLET BY MOUTH THREE TIMES A WEEK AND INCREASE AS TOLERATED (Patient taking differently: Take 5 mg by mouth every Monday, Wednesday, and Friday. In the evening)   Tamsulosin HCl (FLOMAX) 0.4 MG CAPS Take 0.4 mg by mouth every evening.   thiamine 100 MG tablet Take 100 mg by mouth in the morning.   tolnaftate (TINACTIN) 1 % spray Apply topically 2 (two) times daily as needed (itching).   VIMPAT 200 MG TABS tablet Take 200 mg by mouth 2 (two) times daily.   vitamin B-12 (CYANOCOBALAMIN) 1000 MCG tablet Take 1,000 mcg by mouth in the morning.     Review of Systems      All other systems reviewed and are otherwise negative except as noted above.  Physical Exam    VS:  BP 110/72   Pulse (!) 59   Ht _0  (1.727 m)   Wt 242 lb (109.8 kg)   SpO2 98%   BMI 36.80 kg/m  , BMI Body mass index is 36.8 kg/m.  Wt Readings from Last 3 Encounters:  04/09/21 242 lb (109.8 kg)  04/09/21 242  lb (109.8 kg)  03/10/21 241 lb (109.3 kg)   GEN: Well nourished, overweight, well developed, in no acute distress. HEENT: normal. Neck: Supple, no JVD, carotid bruits, or masses. Cardiac: RRR, no murmurs, rubs, or gallops. No clubbing, cyanosis,.  Bilateral nonpitting pretibial edema.  Radials/PT 2+ and equal bilaterally.  Respiratory:  Respirations regular and unlabored, clear to auscultation bilaterally. GI: Soft, nontender, nondistended. MS: No deformity or atrophy. Skin: Warm and dry, no rash. Neuro:  Strength and sensation are intact. Psych: Normal affect.  Assessment & Plan    Confusion / Fatigue -history of worsening confusion and fatigue.  Evaluated by neurology with CT head unremarkable.  Has upcoming MRI and EEG with their office.  Primary care today ordered vitamin levels, thyroid, c-Met, CBC, TSH, sed rate, CRP which we will collect in clinic today so he does not have to return to their office.  Chest x-ray performed by PCP today.  In process of ruling out infectious or inflammatory process as contributory.  If infection noted given recent biventricular pacemaker placement would need to blood cultures.   HFrEF -11/2020 LVEF 35%.  S/p biventricular pacemaker.  Will need optimization of GDMT.  NYHA III with dyspnea and fatigue.  GDMT presently includes Lasix.  Previously on Entresto though with episode of feeling "so tired "and having to lay down, unclear whether Entresto was caused.  Pending lab results today (CMP, BNP) consider addition of MRA further escalation of GDMT as blood pressure tolerates.  Low-salt diet, fluid restriction less than 2 L encouraged.  S/p biV PPM - Site healing appropriately with no evidence of infection.  Has in person device clinic appointment coming up.  Remote check from 04/05/2021 with no acute findings.  Disposition: Follow up in October with Dr. Radford Pax as scheduled in November with Loel Dubonnet, NP   Signed, Loel Dubonnet, NP 04/09/2021, 12:58  PM [place]

## 2021-04-09 NOTE — Assessment & Plan Note (Signed)
Chronic Taking vitamin D daily Check vitamin D level

## 2021-04-09 NOTE — Assessment & Plan Note (Signed)
Chronic Blood pressure well controlled CMP Continue current medication

## 2021-04-09 NOTE — Assessment & Plan Note (Signed)
Acute on chronic Having more confusion and memory issues recently Work-up underway to evaluate cause Has seen neurology and neurosurgery already Will rule out an infection no other metabolic causes

## 2021-04-09 NOTE — Assessment & Plan Note (Signed)
History of PMR We will check ESR, CRP

## 2021-04-09 NOTE — Assessment & Plan Note (Signed)
Chronic Check a1c Low sugar / carb diet

## 2021-04-09 NOTE — Assessment & Plan Note (Signed)
Chronic Taking vitamin B1 Will check level given increased confusion/memory issues

## 2021-04-09 NOTE — Patient Instructions (Signed)
Medication Instructions:  Continue your current medications.  We will consider medication changes pending lab results.   *If you need a refill on your cardiac medications before your next appointment, please call your pharmacy*   Lab Work: Your physician recommends that you return for lab work today. We will send results to Dr. Quay Burow.   If you have labs (blood work) drawn today and your tests are completely normal, you will receive your results only by: [place] (if you have MyChart) OR A paper copy in the mail If you have any lab test that is abnormal or we need to change your treatment, we will call you to review the results.   Testing/Procedures: None ordered today.   Follow-Up: At [facility], you and your health needs are our priority.  As part of our continuing mission to provide you with exceptional heart care, we have created designated Provider Care Teams.  These Care Teams include your primary Cardiologist (physician) and Advanced Practice Providers (APPs -  Physician Assistants and Nurse Practitioners) who all work together to provide you with the care you need, when you need it.  We recommend signing up for the patient portal called "MyChart".  Sign up information is provided on this After Visit Summary.  MyChart is used to connect with patients for Virtual Visits (Telemedicine).  Patients are able to view lab/test results, encounter notes, upcoming appointments, etc.  Non-urgent messages can be sent to your provider as well.   To learn more about what you can do with MyChart, go to [email or web address].    Your next appointment:   As scheduled with Dr. Radford Pax and Loel Dubonnet, NP    Other Instructions  Recommend weighing daily and keeping a log. Please call our office if you have weight gain of 2 pounds overnight or 5 pounds in 1 week.   Heart Healthy Diet Recommendations: A low-salt diet is recommended. Meats should be grilled, baked, or boiled. Avoid  fried foods. Focus on lean protein sources like fish or chicken with vegetables and fruits. The American Heart Association is a Microbiologist!   Exercise recommendations: The American Heart Association recommends 150 minutes of moderate intensity exercise weekly. Try 30 minutes of moderate intensity exercise 4-5 times per week. This could include walking, jogging, or swimming.

## 2021-04-09 NOTE — Assessment & Plan Note (Signed)
Chronic Taking vitamin B12 Will check level given increased confusion/memory issues

## 2021-04-09 NOTE — Patient Instructions (Addendum)
      Blood work was ordered.  Have a chest xray done today.       Medications changes include :   none

## 2021-04-09 NOTE — Assessment & Plan Note (Signed)
Acute Started 1-2 weeks ago-?  Cause Has seen neurology/neurosurgery-CT of the head was stable.  Will have MRI of the brain and EEG eventually, but this may take a while because he just had a new pacemaker placed Urinalysis looks normal-culture pending He has had some increased cough-we will get a chest x-ray today We will check blood work to rule out other infection/metabolic causes, but recent blood work was normal CBC, CMP, TSH, ESR, CRP given history of PMR, A1c, vitamin B1, B12 and D levels Confusion could be related to underlying memory issues No change in medication that would be contributing

## 2021-04-09 NOTE — Assessment & Plan Note (Signed)
Acute on chronic Worse recently Chest x-ray today to rule out bronchitis/pneumonia

## 2021-04-10 LAB — CULTURE, URINE COMPREHENSIVE: RESULT:: NO GROWTH

## 2021-04-13 ENCOUNTER — Telehealth: Payer: Self-pay | Admitting: Internal Medicine

## 2021-04-13 ENCOUNTER — Other Ambulatory Visit (HOSPITAL_BASED_OUTPATIENT_CLINIC_OR_DEPARTMENT_OTHER): Payer: Self-pay

## 2021-04-13 ENCOUNTER — Encounter: Payer: Self-pay | Admitting: Internal Medicine

## 2021-04-13 NOTE — Telephone Encounter (Signed)
Please call daughter with lab results

## 2021-04-13 NOTE — Telephone Encounter (Signed)
He had the blood work drawn at [facility].  I do not see any results in his chart-not sure where they are

## 2021-04-14 ENCOUNTER — Encounter (HOSPITAL_BASED_OUTPATIENT_CLINIC_OR_DEPARTMENT_OTHER): Payer: Self-pay

## 2021-04-14 DIAGNOSIS — H9190 Unspecified hearing loss, unspecified ear: Secondary | ICD-10-CM | POA: Diagnosis present

## 2021-04-14 DIAGNOSIS — F01A2 Vascular dementia, mild, with psychotic disturbance: Secondary | ICD-10-CM | POA: Diagnosis not present

## 2021-04-14 DIAGNOSIS — Z950 Presence of cardiac pacemaker: Secondary | ICD-10-CM | POA: Diagnosis not present

## 2021-04-14 DIAGNOSIS — R059 Cough, unspecified: Secondary | ICD-10-CM | POA: Diagnosis not present

## 2021-04-14 DIAGNOSIS — Z7982 Long term (current) use of aspirin: Secondary | ICD-10-CM | POA: Diagnosis not present

## 2021-04-14 DIAGNOSIS — G911 Obstructive hydrocephalus: Secondary | ICD-10-CM | POA: Diagnosis not present

## 2021-04-14 DIAGNOSIS — G40909 Epilepsy, unspecified, not intractable, without status epilepticus: Secondary | ICD-10-CM | POA: Diagnosis present

## 2021-04-14 DIAGNOSIS — Z9181 History of falling: Secondary | ICD-10-CM | POA: Diagnosis not present

## 2021-04-14 DIAGNOSIS — N400 Enlarged prostate without lower urinary tract symptoms: Secondary | ICD-10-CM | POA: Diagnosis present

## 2021-04-14 DIAGNOSIS — R4181 Age-related cognitive decline: Secondary | ICD-10-CM | POA: Diagnosis not present

## 2021-04-14 DIAGNOSIS — G4733 Obstructive sleep apnea (adult) (pediatric): Secondary | ICD-10-CM | POA: Diagnosis not present

## 2021-04-14 DIAGNOSIS — G96198 Other disorders of meninges, not elsewhere classified: Secondary | ICD-10-CM | POA: Diagnosis not present

## 2021-04-14 DIAGNOSIS — E785 Hyperlipidemia, unspecified: Secondary | ICD-10-CM | POA: Diagnosis not present

## 2021-04-14 DIAGNOSIS — Z982 Presence of cerebrospinal fluid drainage device: Secondary | ICD-10-CM | POA: Diagnosis not present

## 2021-04-14 DIAGNOSIS — R0602 Shortness of breath: Secondary | ICD-10-CM | POA: Diagnosis not present

## 2021-04-14 DIAGNOSIS — Z20822 Contact with and (suspected) exposure to covid-19: Secondary | ICD-10-CM | POA: Diagnosis not present

## 2021-04-14 DIAGNOSIS — Z79899 Other long term (current) drug therapy: Secondary | ICD-10-CM | POA: Diagnosis not present

## 2021-04-14 DIAGNOSIS — I4510 Unspecified right bundle-branch block: Secondary | ICD-10-CM | POA: Diagnosis not present

## 2021-04-14 DIAGNOSIS — Z45018 Encounter for adjustment and management of other part of cardiac pacemaker: Secondary | ICD-10-CM | POA: Diagnosis not present

## 2021-04-14 DIAGNOSIS — R778 Other specified abnormalities of plasma proteins: Secondary | ICD-10-CM | POA: Diagnosis not present

## 2021-04-14 DIAGNOSIS — I69218 Other symptoms and signs involving cognitive functions following other nontraumatic intracranial hemorrhage: Secondary | ICD-10-CM | POA: Diagnosis not present

## 2021-04-14 DIAGNOSIS — R4182 Altered mental status, unspecified: Secondary | ICD-10-CM | POA: Diagnosis not present

## 2021-04-14 DIAGNOSIS — Z87891 Personal history of nicotine dependence: Secondary | ICD-10-CM | POA: Diagnosis not present

## 2021-04-14 DIAGNOSIS — I10 Essential (primary) hypertension: Secondary | ICD-10-CM | POA: Diagnosis not present

## 2021-04-14 NOTE — Telephone Encounter (Signed)
Labs show normal kidney function, liver function, electrolytes. CBC with no evidence of anemia nor infection.  A1c in prediabetic range. Normal thyroid function. Vitamin D low normal. Vitamin B level was elevated. Sed rate and CRP normal with no evidence of inflammation. CRP and sed rate normal with no evidence of inflammation. BNP was essentially normal with no evidence of significant volume overload. The vitamin B12 is pending. These are overall reassuring labs!   CMP: Creatinine 0.82, GFR 91, K 3.9, Na 139, AST 30, ALT 35 CBC: WBC 7.3, Hb 14.5, Hct 41.1, Plt 161 A1c 6.3 TSH 1.23 Vit D 45.2 (range 30-100) Vit B 252.1 *high* (range 66.5-200) BNP 100.5 (range <100) Sed rate 7, CRP <1  B12 pending

## 2021-04-14 NOTE — Telephone Encounter (Signed)
Called pt daughter, Jeff Weaver per [place]. Informed her that I was able to contact [facility]. They said everything has resulted except for the B12 and they will fax the results to our office. Informed her we will also fax results to Dr. Quay Burow.   Informed her that we will call her back today once those results have been reviewed. Pt daughter verbalized thanks and understanding.

## 2021-04-15 LAB — COMPREHENSIVE METABOLIC PANEL
ALT: 35 IU/L (ref 0–44)
AST: 30 IU/L (ref 0–40)
Albumin/Globulin Ratio: 1.6 (ref 1.2–2.2)
Albumin: 3.9 g/dL (ref 3.7–4.7)
Alkaline Phosphatase: 53 IU/L (ref 44–121)
BUN/Creatinine Ratio: 15 (ref 10–24)
BUN: 12 mg/dL (ref 8–27)
Bilirubin Total: 0.5 mg/dL (ref 0.0–1.2)
CO2: 22 mmol/L (ref 20–29)
Calcium: 9.7 mg/dL (ref 8.6–10.2)
Chloride: 99 mmol/L (ref 96–106)
Creatinine, Ser: 0.82 mg/dL (ref 0.76–1.27)
Globulin, Total: 2.4 g/dL (ref 1.5–4.5)
Glucose: 107 mg/dL — ABNORMAL HIGH (ref 70–99)
Potassium: 3.9 mmol/L (ref 3.5–5.2)
Sodium: 139 mmol/L (ref 134–144)
Total Protein: 6.3 g/dL (ref 6.0–8.5)
eGFR: 91 mL/min/{1.73_m2} (ref >59)

## 2021-04-15 LAB — VITAMIN D 25 HYDROXY (VIT D DEFICIENCY, FRACTURES): Vit D, 25-Hydroxy: 45.2 ng/mL (ref 30.0–100.0)

## 2021-04-15 LAB — CBC
Hematocrit: 41.1 % (ref 37.5–51.0)
Hemoglobin: 14.5 g/dL (ref 13.0–17.7)
MCH: 31 pg (ref 26.6–33.0)
MCHC: 35.3 g/dL (ref 31.5–35.7)
MCV: 88 fL (ref 79–97)
Platelets: 161 10*3/uL (ref 150–450)
RBC: 4.68 x10E6/uL (ref 4.14–5.80)
RDW: 12.4 % (ref 11.6–15.4)
WBC: 7.3 10*3/uL (ref 3.4–10.8)

## 2021-04-15 LAB — VITAMIN B12

## 2021-04-15 LAB — VITAMIN B1: Thiamine: 252.1 nmol/L — ABNORMAL HIGH (ref 66.5–200.0)

## 2021-04-15 LAB — TSH: TSH: 1.23 u[IU]/mL (ref 0.450–4.500)

## 2021-04-15 LAB — HEMOGLOBIN A1C
Est. average glucose Bld gHb Est-mCnc: 134 mg/dL
Hgb A1c MFr Bld: 6.3 % — ABNORMAL HIGH (ref 4.8–5.6)

## 2021-04-15 LAB — SEDIMENTATION RATE: Sed Rate: 7 mm/hr (ref 0–30)

## 2021-04-15 LAB — BRAIN NATRIURETIC PEPTIDE: BNP: 100.5 pg/mL — ABNORMAL HIGH (ref 0.0–100.0)

## 2021-04-15 LAB — C-REACTIVE PROTEIN: CRP: <1 mg/L (ref 0–10)

## 2021-04-15 NOTE — Telephone Encounter (Signed)
Blood work in chart. Family aware of results

## 2021-04-19 DIAGNOSIS — R972 Elevated prostate specific antigen [PSA]: Secondary | ICD-10-CM | POA: Diagnosis not present

## 2021-04-19 DIAGNOSIS — R82998 Other abnormal findings in urine: Secondary | ICD-10-CM | POA: Diagnosis not present

## 2021-04-19 DIAGNOSIS — R351 Nocturia: Secondary | ICD-10-CM | POA: Diagnosis not present

## 2021-04-19 DIAGNOSIS — R3129 Other microscopic hematuria: Secondary | ICD-10-CM | POA: Diagnosis not present

## 2021-04-19 DIAGNOSIS — D7289 Other specified disorders of white blood cells: Secondary | ICD-10-CM | POA: Diagnosis not present

## 2021-04-19 DIAGNOSIS — R800 Isolated proteinuria: Secondary | ICD-10-CM | POA: Diagnosis not present

## 2021-04-19 DIAGNOSIS — N401 Enlarged prostate with lower urinary tract symptoms: Secondary | ICD-10-CM | POA: Diagnosis not present

## 2021-04-19 DIAGNOSIS — R3915 Urgency of urination: Secondary | ICD-10-CM | POA: Diagnosis not present

## 2021-04-26 ENCOUNTER — Encounter: Payer: Medicare Other | Admitting: Cardiology

## 2021-04-27 DIAGNOSIS — Y998 Other external cause status: Secondary | ICD-10-CM | POA: Diagnosis not present

## 2021-04-27 DIAGNOSIS — E785 Hyperlipidemia, unspecified: Secondary | ICD-10-CM | POA: Diagnosis present

## 2021-04-27 DIAGNOSIS — R4182 Altered mental status, unspecified: Secondary | ICD-10-CM | POA: Diagnosis not present

## 2021-04-27 DIAGNOSIS — Z87891 Personal history of nicotine dependence: Secondary | ICD-10-CM | POA: Diagnosis not present

## 2021-04-27 DIAGNOSIS — G9389 Other specified disorders of brain: Secondary | ICD-10-CM | POA: Diagnosis present

## 2021-04-27 DIAGNOSIS — F0150 Vascular dementia without behavioral disturbance: Secondary | ICD-10-CM | POA: Diagnosis not present

## 2021-04-27 DIAGNOSIS — R296 Repeated falls: Secondary | ICD-10-CM | POA: Diagnosis not present

## 2021-04-27 DIAGNOSIS — R569 Unspecified convulsions: Secondary | ICD-10-CM | POA: Diagnosis not present

## 2021-04-27 DIAGNOSIS — R410 Disorientation, unspecified: Secondary | ICD-10-CM | POA: Diagnosis not present

## 2021-04-27 DIAGNOSIS — W1839XA Other fall on same level, initial encounter: Secondary | ICD-10-CM | POA: Diagnosis not present

## 2021-04-27 DIAGNOSIS — F0152 Vascular dementia, unspecified severity, with psychotic disturbance: Secondary | ICD-10-CM | POA: Diagnosis present

## 2021-04-27 DIAGNOSIS — Z8249 Family history of ischemic heart disease and other diseases of the circulatory system: Secondary | ICD-10-CM | POA: Diagnosis not present

## 2021-04-27 DIAGNOSIS — I10 Essential (primary) hypertension: Secondary | ICD-10-CM | POA: Diagnosis present

## 2021-04-27 DIAGNOSIS — R413 Other amnesia: Secondary | ICD-10-CM | POA: Diagnosis not present

## 2021-04-27 DIAGNOSIS — Z7982 Long term (current) use of aspirin: Secondary | ICD-10-CM | POA: Diagnosis not present

## 2021-04-27 DIAGNOSIS — G40909 Epilepsy, unspecified, not intractable, without status epilepticus: Secondary | ICD-10-CM | POA: Diagnosis present

## 2021-04-27 DIAGNOSIS — Z982 Presence of cerebrospinal fluid drainage device: Secondary | ICD-10-CM | POA: Diagnosis not present

## 2021-04-28 ENCOUNTER — Ambulatory Visit: Payer: Medicare Other | Admitting: Cardiology

## 2021-04-28 DIAGNOSIS — G40909 Epilepsy, unspecified, not intractable, without status epilepticus: Secondary | ICD-10-CM | POA: Diagnosis present

## 2021-04-28 DIAGNOSIS — Z7982 Long term (current) use of aspirin: Secondary | ICD-10-CM | POA: Diagnosis not present

## 2021-04-28 DIAGNOSIS — Z87891 Personal history of nicotine dependence: Secondary | ICD-10-CM | POA: Diagnosis not present

## 2021-04-28 DIAGNOSIS — R296 Repeated falls: Secondary | ICD-10-CM | POA: Diagnosis not present

## 2021-04-28 DIAGNOSIS — G9389 Other specified disorders of brain: Secondary | ICD-10-CM | POA: Diagnosis present

## 2021-04-28 DIAGNOSIS — R569 Unspecified convulsions: Secondary | ICD-10-CM | POA: Diagnosis not present

## 2021-04-28 DIAGNOSIS — F0152 Vascular dementia, unspecified severity, with psychotic disturbance: Secondary | ICD-10-CM | POA: Diagnosis present

## 2021-04-28 DIAGNOSIS — Z982 Presence of cerebrospinal fluid drainage device: Secondary | ICD-10-CM | POA: Diagnosis not present

## 2021-04-28 DIAGNOSIS — R4182 Altered mental status, unspecified: Secondary | ICD-10-CM | POA: Diagnosis not present

## 2021-04-28 DIAGNOSIS — R410 Disorientation, unspecified: Secondary | ICD-10-CM | POA: Diagnosis not present

## 2021-04-28 DIAGNOSIS — F0150 Vascular dementia without behavioral disturbance: Secondary | ICD-10-CM | POA: Diagnosis not present

## 2021-04-28 DIAGNOSIS — Z8249 Family history of ischemic heart disease and other diseases of the circulatory system: Secondary | ICD-10-CM | POA: Diagnosis not present

## 2021-04-28 DIAGNOSIS — I10 Essential (primary) hypertension: Secondary | ICD-10-CM | POA: Diagnosis present

## 2021-04-28 DIAGNOSIS — E785 Hyperlipidemia, unspecified: Secondary | ICD-10-CM | POA: Diagnosis present

## 2021-04-28 DIAGNOSIS — R413 Other amnesia: Secondary | ICD-10-CM | POA: Diagnosis not present

## 2021-04-29 DIAGNOSIS — R296 Repeated falls: Secondary | ICD-10-CM | POA: Insufficient documentation

## 2021-05-04 ENCOUNTER — Other Ambulatory Visit (HOSPITAL_BASED_OUTPATIENT_CLINIC_OR_DEPARTMENT_OTHER): Payer: Self-pay

## 2021-05-04 DIAGNOSIS — Z888 Allergy status to other drugs, medicaments and biological substances status: Secondary | ICD-10-CM | POA: Diagnosis not present

## 2021-05-04 DIAGNOSIS — Z8679 Personal history of other diseases of the circulatory system: Secondary | ICD-10-CM | POA: Diagnosis not present

## 2021-05-04 DIAGNOSIS — Z79899 Other long term (current) drug therapy: Secondary | ICD-10-CM | POA: Diagnosis not present

## 2021-05-04 DIAGNOSIS — R441 Visual hallucinations: Secondary | ICD-10-CM | POA: Diagnosis not present

## 2021-05-04 DIAGNOSIS — F0390 Unspecified dementia without behavioral disturbance: Secondary | ICD-10-CM | POA: Diagnosis not present

## 2021-05-04 DIAGNOSIS — R296 Repeated falls: Secondary | ICD-10-CM | POA: Diagnosis not present

## 2021-05-04 DIAGNOSIS — Z8669 Personal history of other diseases of the nervous system and sense organs: Secondary | ICD-10-CM | POA: Diagnosis not present

## 2021-05-06 ENCOUNTER — Other Ambulatory Visit (HOSPITAL_BASED_OUTPATIENT_CLINIC_OR_DEPARTMENT_OTHER): Payer: Self-pay

## 2021-05-06 MED ORDER — DONEPEZIL HCL 10 MG PO TABS
ORAL_TABLET | ORAL | 5 refills | Status: AC
  Filled 2021-05-06: qty 30, 30d supply, fill #0

## 2021-05-07 ENCOUNTER — Other Ambulatory Visit (HOSPITAL_BASED_OUTPATIENT_CLINIC_OR_DEPARTMENT_OTHER): Payer: Self-pay

## 2021-05-11 DIAGNOSIS — R4189 Other symptoms and signs involving cognitive functions and awareness: Secondary | ICD-10-CM | POA: Diagnosis not present

## 2021-05-11 DIAGNOSIS — R296 Repeated falls: Secondary | ICD-10-CM | POA: Diagnosis not present

## 2021-05-11 DIAGNOSIS — I63512 Cerebral infarction due to unspecified occlusion or stenosis of left middle cerebral artery: Secondary | ICD-10-CM | POA: Diagnosis not present

## 2021-05-11 DIAGNOSIS — R41841 Cognitive communication deficit: Secondary | ICD-10-CM | POA: Diagnosis not present

## 2021-05-11 DIAGNOSIS — R410 Disorientation, unspecified: Secondary | ICD-10-CM | POA: Diagnosis not present

## 2021-05-11 DIAGNOSIS — G3183 Dementia with Lewy bodies: Secondary | ICD-10-CM | POA: Diagnosis not present

## 2021-05-11 DIAGNOSIS — F02A2 Dementia in other diseases classified elsewhere, mild, with psychotic disturbance: Secondary | ICD-10-CM | POA: Diagnosis not present

## 2021-05-11 DIAGNOSIS — R413 Other amnesia: Secondary | ICD-10-CM | POA: Diagnosis not present

## 2021-05-11 DIAGNOSIS — Z789 Other specified health status: Secondary | ICD-10-CM | POA: Diagnosis not present

## 2021-05-11 DIAGNOSIS — R279 Unspecified lack of coordination: Secondary | ICD-10-CM | POA: Diagnosis not present

## 2021-05-11 DIAGNOSIS — R419 Unspecified symptoms and signs involving cognitive functions and awareness: Secondary | ICD-10-CM | POA: Diagnosis not present

## 2021-05-18 DIAGNOSIS — R41841 Cognitive communication deficit: Secondary | ICD-10-CM | POA: Diagnosis not present

## 2021-05-18 DIAGNOSIS — R279 Unspecified lack of coordination: Secondary | ICD-10-CM | POA: Diagnosis not present

## 2021-05-18 DIAGNOSIS — G3183 Dementia with Lewy bodies: Secondary | ICD-10-CM | POA: Diagnosis not present

## 2021-05-18 DIAGNOSIS — R413 Other amnesia: Secondary | ICD-10-CM | POA: Diagnosis not present

## 2021-05-18 DIAGNOSIS — R296 Repeated falls: Secondary | ICD-10-CM | POA: Diagnosis not present

## 2021-05-18 DIAGNOSIS — R410 Disorientation, unspecified: Secondary | ICD-10-CM | POA: Diagnosis not present

## 2021-05-19 DIAGNOSIS — R41844 Frontal lobe and executive function deficit: Secondary | ICD-10-CM | POA: Diagnosis not present

## 2021-05-19 DIAGNOSIS — G9340 Encephalopathy, unspecified: Secondary | ICD-10-CM | POA: Diagnosis not present

## 2021-05-19 DIAGNOSIS — G40209 Localization-related (focal) (partial) symptomatic epilepsy and epileptic syndromes with complex partial seizures, not intractable, without status epilepticus: Secondary | ICD-10-CM | POA: Diagnosis not present

## 2021-05-20 DIAGNOSIS — G40209 Localization-related (focal) (partial) symptomatic epilepsy and epileptic syndromes with complex partial seizures, not intractable, without status epilepticus: Secondary | ICD-10-CM | POA: Diagnosis not present

## 2021-05-20 DIAGNOSIS — G9340 Encephalopathy, unspecified: Secondary | ICD-10-CM | POA: Diagnosis not present

## 2021-05-20 DIAGNOSIS — R41844 Frontal lobe and executive function deficit: Secondary | ICD-10-CM | POA: Diagnosis not present

## 2021-05-21 DIAGNOSIS — G9340 Encephalopathy, unspecified: Secondary | ICD-10-CM | POA: Diagnosis not present

## 2021-05-21 DIAGNOSIS — R2689 Other abnormalities of gait and mobility: Secondary | ICD-10-CM | POA: Diagnosis not present

## 2021-05-21 DIAGNOSIS — R41844 Frontal lobe and executive function deficit: Secondary | ICD-10-CM | POA: Diagnosis not present

## 2021-05-21 DIAGNOSIS — Z87820 Personal history of traumatic brain injury: Secondary | ICD-10-CM | POA: Diagnosis not present

## 2021-05-21 DIAGNOSIS — G3183 Dementia with Lewy bodies: Secondary | ICD-10-CM | POA: Diagnosis not present

## 2021-05-21 DIAGNOSIS — Z7409 Other reduced mobility: Secondary | ICD-10-CM | POA: Diagnosis not present

## 2021-05-21 DIAGNOSIS — F02A2 Dementia in other diseases classified elsewhere, mild, with psychotic disturbance: Secondary | ICD-10-CM | POA: Diagnosis not present

## 2021-05-21 DIAGNOSIS — F0150 Vascular dementia without behavioral disturbance: Secondary | ICD-10-CM | POA: Diagnosis not present

## 2021-05-24 DIAGNOSIS — R410 Disorientation, unspecified: Secondary | ICD-10-CM | POA: Diagnosis not present

## 2021-05-24 DIAGNOSIS — R279 Unspecified lack of coordination: Secondary | ICD-10-CM | POA: Diagnosis not present

## 2021-05-24 DIAGNOSIS — R413 Other amnesia: Secondary | ICD-10-CM | POA: Diagnosis not present

## 2021-05-24 DIAGNOSIS — R296 Repeated falls: Secondary | ICD-10-CM | POA: Diagnosis not present

## 2021-05-24 DIAGNOSIS — G3183 Dementia with Lewy bodies: Secondary | ICD-10-CM | POA: Diagnosis not present

## 2021-05-24 DIAGNOSIS — R41841 Cognitive communication deficit: Secondary | ICD-10-CM | POA: Diagnosis not present

## 2021-05-25 ENCOUNTER — Other Ambulatory Visit: Payer: Self-pay

## 2021-05-25 ENCOUNTER — Other Ambulatory Visit (HOSPITAL_BASED_OUTPATIENT_CLINIC_OR_DEPARTMENT_OTHER): Payer: Self-pay

## 2021-05-25 ENCOUNTER — Ambulatory Visit (INDEPENDENT_AMBULATORY_CARE_PROVIDER_SITE_OTHER): Payer: Medicare Other | Admitting: Family

## 2021-05-25 VITALS — BP 114/62 | HR 63 | Ht 68.0 in | Wt 239.0 lb

## 2021-05-25 DIAGNOSIS — I420 Dilated cardiomyopathy: Secondary | ICD-10-CM | POA: Diagnosis not present

## 2021-05-25 DIAGNOSIS — I5020 Unspecified systolic (congestive) heart failure: Secondary | ICD-10-CM

## 2021-05-25 DIAGNOSIS — Z95810 Presence of automatic (implantable) cardiac defibrillator: Secondary | ICD-10-CM | POA: Diagnosis not present

## 2021-05-25 MED ORDER — SPIRONOLACTONE 25 MG PO TABS: 12.5000 mg | ORAL_TABLET | Freq: Every day | ORAL | 1 refills | Status: DC

## 2021-05-25 MED ORDER — SPIRONOLACTONE 25 MG PO TABS
12.5000 mg | ORAL_TABLET | Freq: Every day | ORAL | 0 refills | Status: DC
  Filled 2021-05-25: qty 15, 30d supply, fill #0

## 2021-05-25 NOTE — Progress Notes (Signed)
Office Visit    Patient Name: Jeff Weaver Date of Encounter: 05/25/2021  PCP:  [name], MD   [place]  Cardiologist:  Fransico Him, MD  Advanced Practice Provider:  No care team member to display Electrophysiologist:  Will Meredith Leeds, MD     Chief Complaint    Jeff Weaver is a 75 y.o. male with a hx of obesity, HFrEF, orthostasis, HLD, prediabetes, cerebral aneurysm post clipping, seizure disorder s/p VP shunt, OSA, aortic insufficiency, junctional bradycarida s/p PPM 2019 and biV pacer upgrade 03/10/21 presents today for heart failure follow up. .   Past Medical History    Past Medical History:  Diagnosis Date   Aortic insufficiency    a. mild 2017 echo.   Atypical chest pain    Cerebral aneurysm    a. s/p clipping at [facility] in 8/13 c/b short-term memory loss, cerebral hemorrhage and seizure disorder s/p VP shunt.   Cerebral hemorrhage (HCC)    Chronic diastolic CHF (congestive heart failure) (HCC)    CVA (cerebral infarction)    Diverticulosis    Enteritis (regional)    Dr Olevia Perches   First degree AV block    GI bleed    HTN (hypertension)    Hyperlipidemia    Hypertensive heart disease    IBS (irritable bowel syndrome)    Mild CAD    Cardiac cath 01/12/18 showed minimal nonobstructive CAD, normal LVEF, normal LVEDP.   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 s/p PPM 07/02/18.   Past Surgical History:  Procedure Laterality Date   BIV PACEMAKER INSERTION CRT-P N/A 03/10/2021   Procedure: BIV PACEMAKER INSERTION CRT-P UPGRADE;  Surgeon: Constance Haw, MD;  Location: [place] [facility];  Service: Cardiovascular;  Laterality: N/A;   CHOLECYSTECTOMY N/A 01/07/2016   Procedure: LAPAROSCOPIC CHOLECYSTECTOMY ;  Surgeon: Coralie Keens, MD;  Location: [place];  Service: General;  Laterality: N/A;   cns shunt  02/23/12   COLONOSCOPY W/ POLYPECTOMY  1978   negative 2009, Dr Olevia Perches. Due 2014   CRANIOTOMY  02/02/12   Dr Harvel Ricks, [place] of aneurysm   CRANIOTOMY  02-02-12   left pterional craniotomy for clipping complex anterior communicating artery aneurysm    HERNIA REPAIR     LEFT HEART CATH AND CORONARY ANGIOGRAPHY N/A 01/12/2018   Procedure: LEFT HEART CATH AND CORONARY ANGIOGRAPHY;  Surgeon: Martinique, Peter M, MD;  Location: [facility];  Service: Cardiovascular;  Laterality: N/A;   LEFT HEART CATHETERIZATION WITH CORONARY ANGIOGRAM N/A 11/26/2013   Procedure: LEFT HEART CATHETERIZATION WITH CORONARY ANGIOGRAM;  Surgeon: [name], MD;  Location: [facility] CATH LAB;  Service: Cardiovascular;  Laterality: N/A;   NOSE SURGERY     PACEMAKER IMPLANT N/A 07/02/2018   Procedure: PACEMAKER IMPLANT;  Surgeon: Constance Haw, MD;  Location: [facility];  Service: Cardiovascular;  Laterality: N/A;   SHOULDER SURGERY  1997   TONSILLECTOMY AND ADENOIDECTOMY     VENTRICULOPERITONEAL SHUNT  02-23-12  INSERTION OF RIGHT FRONTAL VENTRICULOPERITONEAL SHUNT WITH A CODMAN HAKIM PROGRAMMABLE VALVE    Allergies  Allergies  Allergen Reactions   Haloperidol Rash and Other (See Comments)    Hallucinations, agitation    Rosuvastatin     Leg pain mainly in knees Other reaction(s): Generalized aches and pains (currently tolerates in low doses)   Simvastatin Nausea And Vomiting    2007: nausea & vomiting ; [facility] ( Jeff Weaver does not remember such)   Lisinopril Cough   Nifedipine Swelling    Thought to contribute to lower extremity edema    Pravastatin     Rxed by [facility] 2009; "myositis" Ok to take per pt's wife/Dr. Linna Darner Other reaction(s):  Myositis    History of Present Illness    Jeff Weaver is a 75 y.o. male with a hx of obesity, HFrEF, orthostasis, HLD, prediabetes, cerebral aneurysm post clipping, seizure disorder s/p VP shunt, OSA, aortic insufficiency, junctional bradycarida s/p PPM 2019 and biV pacer upgrade 03/10/21 last seen 03/23/21.  03/10/21 Jeff Weaver had PPM upgraded to biV pacer with Dr. Curt Bears due to heart failure. 03/23/21 seen by Oda Kilts 03/23/21 for wound check appointment and steri strips removed. Contacted the office 04/05/21 noting confusion and sleepiness. Remote device check 04/05/21 with normal device function and no arrhythmia.   Jeff Weaver was seen 04/09/21 with marked confusion and fatigue. Primary care had ordered a series of labs and neurology ordered VP shunt series. Family noted progressive fatigue over one week. Given acute workup of cognition, optimization of heart failure therapy was deferred though family interested in pursuing in future.   Since last seen admitted 04/14/21 at [place] due to altered mental status. VP shunt evaluated by neurosurgery. Etiology of confusion thought to be progressoin of Jeff Weaver vascular dementia. Readmitted 04/27/21 at [facility] due to altered mental status, hallucination, and falls. EEG mild encephalopathy non specific and expected focal cerebral dysfunction in left frontotemporal region. CT head and MRI brain no acute findings. BP were elevated during admission and Jeff Weaver was discharged on Losartan 12.13m QD.   Pesents today for folow up with Jeff Weaver wife and daughter. 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  Notes significant cognitive decline occurred after new pacemaker.  Jeff Weaver tells me when Jeff Weaver presses on Jeff Weaver PPM site it is sore.  Jeff Weaver will occasionally have sensation of heart pounding which self resolves after about 10 minutes.  This occurs while resting.  No exertional  chest pain or dyspnea. Notes no orthopnea, PND. Jeff Weaver does have some mild bilateral LE edema by end of day with a sock line notable. Wife and daughter note Jeff Weaver has [facility] upcoming [facility](Dr. ZCoralyn Pear. Jeff Weaver is working with outpatient OT and PT.  EKGs/Labs/Other Studies Reviewed:   The following studies were reviewed today:  Echocardiogram 11/24/2020 1. Left ventricular ejection fraction, by estimation, is 35% with  hypokinesis of the inferoseptal, inferior , mid/distal anterior, distal  lateral and apical walls . The left ventricle has moderately decreased  function. There is moderate left ventricular   hypertrophy. Left ventricular diastolic parameters are consistent with  Grade I diastolic dysfunction (impaired relaxation). Elevated left atrial  pressure. Compared to echo report from 2017, LVEF is worse   2. Right ventricular systolic function is low normal. The right  ventricular size is normal.   3. The mitral valve is normal in  structure. No evidence of mitral valve  regurgitation.   4. The aortic valve is normal in structure. Aortic valve regurgitation is  mild.   5. Aortic dilatation noted. There is mild dilatation of the aortic root,  measuring 41 mm.   Chest x-ray 04/09/2021   FINDINGS: Left subclavian biventricular pacemaker leads are unchanged, projecting over the right atrium, right ventricle and coronary sinus. The heart size and mediastinal contours are stable with mild aortic atherosclerosis.   There are persistent low lung volumes with associated bibasilar atelectasis. No confluent airspace opacity, edema, pneumothorax or significant pleural effusion identified. Probable ventricular peritoneal shunt over the left chest again noted. The bones appear unchanged. Possible loose body about the right shoulder.   IMPRESSION: Stable radiographic appearance of the chest with associated bibasilar atelectasis. No acute findings identified.  EKG: No  EKG today the  Recent Labs: 11/09/2020: Magnesium 1.9; NT-Pro BNP 430 04/09/2021: ALT 35; BNP 100.5; BUN 12; Creatinine, Ser 0.82; Hemoglobin 14.5; Platelets 161; Potassium 3.9; Sodium 139; TSH 1.230  Recent Lipid Panel    Component Value Date/Time   CHOL 135 11/09/2020 1011   CHOL 159 03/27/2013 0818   TRIG 81 11/09/2020 1011   TRIG 127 03/27/2013 0818   HDL 46 11/09/2020 1011   HDL 45 03/27/2013 0818   CHOLHDL 2.9 11/09/2020 1011   CHOLHDL 3 04/18/2018 1023   VLDL 26.2 04/18/2018 1023   LDLCALC 73 11/09/2020 1011   LDLCALC 89 03/27/2013 0818   LDLDIRECT 87.6 09/19/2012 1206   Home Medications   Current Meds  Medication Sig   acetaminophen (TYLENOL) 325 MG tablet Take 325-650 mg by mouth every 6 (six) hours as needed (pain/headaches.).   amoxicillin (AMOXIL) 500 MG capsule Take 2,000 mg by mouth as directed. Take 2000 mg by mouth 1 hour prior to dental appointments    aspirin 325 MG EC tablet Take 325-650 mg by mouth daily as needed for pain.   aspirin EC 81 MG tablet Take 1 tablet (81 mg total) by mouth daily. (Patient taking differently: Take 81 mg by mouth in the morning.)   cetirizine (ZYRTEC) 10 MG tablet Take 1 tablet (10 mg total) by mouth daily. (Patient taking differently: Take 10 mg by mouth in the morning.)   Cholecalciferol (VITAMIN D) 50 MCG (2000 UT) CAPS Take 2,000 Units by mouth in the morning.   clonazePAM (KLONOPIN) 1 MG disintegrating tablet Take 1 mg by mouth as directed. ALLOW 1 TABLET TO DISSOLVE IN CHEEK FOR SEIZURES LASTING LONGER THAN 4 MINUTES. OR AFTER 2ND SEIZURE IN 24 HOURS   desonide (DESOWEN) 0.05 % ointment Apply 1 application topically 2 (two) times daily as needed (skin irritation).   donepezil (ARICEPT) 10 MG tablet Take half a tablet in the morning for a month and then increase to 1 full tablet daily.   furosemide (LASIX) 40 MG tablet TAKE 1 TABLET (40 MG TOTAL) BY MOUTH DAILY. (Patient taking differently: Take 40 mg by mouth in the morning.)    levETIRAcetam (KEPPRA) 500 MG tablet Take 1,000-1,500 mg by mouth See admin instructions. Take 2 tablets (1000 mg) by mouth in the morning & take 3 tablets (1500 mg) by mouth in the evening   LORazepam (ATIVAN) 1 MG tablet Take 1 mg by mouth every 6 (six) hours as needed for seizure.    menthol-zinc oxide (GOLD BOND) powder Apply 1 application topically 2 (two) times daily as needed (itching/irritation).   miconazole (ZEASORB-AF) 2 % powder Apply 1 application topically as needed for  itching.   Midazolam (NAYZILAM) 5 MG/0.1ML SOLN Place 1 each into the nose as needed (seizure).   mirabegron ER (MYRBETRIQ) 50 MG TB24 tablet Take 1 tablet (50 mg total) by mouth daily. (Patient taking differently: Take 50 mg by mouth every evening.)   nitroGLYCERIN (NITROSTAT) 0.4 MG SL tablet PLACE 1 TABLET (0.4 MG TOTAL) UNDER THE TONGUE EVERY 5 (FIVE) MINUTES AS NEEDED FOR CHEST PAIN. (Patient taking differently: Place 0.4 mg under the tongue every 5 (five) minutes x 3 doses as needed for chest pain.)   NON FORMULARY CPAP Machine: authorized by [place] in [place]    omeprazole (PRILOSEC) 40 MG capsule TAKE 1 CAPSULE (40 MG TOTAL) BY MOUTH DAILY. (Patient taking differently: Take 40 mg by mouth daily as needed (heartburn/indigestion.).)   Propylene Glycol (SYSTANE BALANCE) 0.6 % SOLN Apply 1 drop to eye daily as needed (dry eyes).   pyridOXINE (VITAMIN B-6) 100 MG tablet Take 100 mg by mouth in the morning.   rosuvastatin (CRESTOR) 5 MG tablet TAKE 1 TABLET BY MOUTH THREE TIMES A WEEK AND INCREASE AS TOLERATED (Patient taking differently: Take 5 mg by mouth every Monday, Wednesday, and Friday. In the evening)   Tamsulosin HCl (FLOMAX) 0.4 MG CAPS Take 0.4 mg by mouth every evening.   thiamine 100 MG tablet Take 100 mg by mouth in the morning.   tolnaftate (TINACTIN) 1 % spray Apply topically 2 (two) times daily as needed (itching).   traZODone (DESYREL) 50 MG tablet Take 1 tablet by mouth at bedtime.   VIMPAT 200 MG TABS  tablet Take 200 mg by mouth 2 (two) times daily.   vitamin B-12 (CYANOCOBALAMIN) 1000 MCG tablet Take 1,000 mcg by mouth in the morning.     Review of Systems      All other systems reviewed and are otherwise negative except as noted above.  Physical Exam    VS:  BP 114/62 (BP Location: Left Arm, Patient Position: Sitting, Cuff Size: Large)   Pulse 63   Ht 5' 8"  (1.727 m)   Wt 239 lb (108.4 kg)   BMI 36.34 kg/m  , BMI Body mass index is 36.34 kg/m.  Wt Readings from Last 3 Encounters:  05/25/21 239 lb (108.4 kg)  04/09/21 242 lb (109.8 kg)  04/09/21 242 lb (109.8 kg)   GEN: Well nourished, overweight, well developed, in no acute distress. HEENT: normal. Neck: Supple, no JVD, carotid bruits, or masses. Cardiac: RRR, no murmurs, rubs, or gallops. No clubbing, cyanosis,.  Bilateral nonpitting pretibial edema.  Radials/PT 2+ and equal bilaterally.  Respiratory:  Respirations regular and unlabored, clear to auscultation bilaterally. GI: Soft, nontender, nondistended. MS: No deformity or atrophy. Skin: Warm and dry, no rash. Neuro:  Strength and sensation are intact. Psych: Normal affect.  Assessment & Plan    Confusion / Lewy Body dementia - Following with neurology as well as outpatient PT/OT. Recent progressive decline. Jeff Weaver is pleasantly confused at this time but has been mixing up days/nights and 2 recent admissions for confusion, hallucinations.  Presently on Aricept.   Seizures - Follows with neurology. Presently on Keppra.   HFrEF - 11/2020 LVEF 35%.  S/p biventricular pacemaker 03/10/21.  Will need optimization of GDMT.  NYHA II - III.  GDMT presently includes Lasix.  Previously on Entresto though with episode of feeling "so tired "and having to lay down, unclear whether Entresto was caused. Given relative hypotension, defer ACE/ARB/ARNI. Start Spironolactone 12.84m QD. Encouraged family to check BP daily at home and  if routinely <120/60 consider reducing dose of Lasix to 65m  QD.  Low-salt diet, fluid restriction less than 2 L encouraged. Recommend compression stockings for LE edema. Could consider repeat echo at follow up to re-evaluate LVEF.   S/p biV PPM - Site healing appropriately with no evidence of infection.  Nontender on palpation. Episodes of heart 'pounding' with sensation of pressure occurring at rest. Will request device clinic to do remote check. Atypical for angina, no indication for ischemic evaluation. Previous myoview 12/16/20 with no ischemia.   OSA - CPAP compliance encouraged. Follows with Dr. TRadford Paxfor sleep medicine.  Disposition: Follow up in 2 months with Dr. CHarrell Gave(previous patient of Dr. NMeda Coffeeto establish with Dr. CHarrell Gavefor general cardiology care)  Signed, CLoel Dubonnet NP 05/25/2021, 1:35 PM [place]

## 2021-05-25 NOTE — Patient Instructions (Addendum)
Medication Instructions:  Your physician has recommended you make the following change in your medication:   START Spironolactone half tablet 12.38m once per day  *If you need a refill on your cardiac medications before your next appointment, please call your pharmacy*   Lab Work: None ordered today.   Testing/Procedures: None ordered today.   Follow-Up: [facility] and your health needs are our priority.  As part of our continuing mission to provide you with exceptional heart care, we have created designated Provider Care Teams.  These Care Teams include your primary Cardiologist (physician) and Advanced Practice Providers (APPs -  Physician Assistants and Nurse Practitioners) who all work together to provide you with the care you need, when you need it.  We recommend signing up for the patient portal called "MyChart".  Sign up information is provided on this After Visit Summary.  MyChart is used to connect with patients for Virtual Visits (Telemedicine).  Patients are able to view lab/test results, encounter notes, upcoming appointments, etc.  Non-urgent messages can be sent to your provider as well.   To learn more about what you can do with MyChart, go to [email or web address]    Your next appointment:   January 2022 with Dr. CHarrell Gaveor CLoel Dubonnet NP   Other Instructions  Recommend weighing daily and keeping a log. Please call our office if you have weight gain of 2 pounds overnight or 5 pounds in 1 week.   Check BP once per day and keep a log. Send CLoel Dubonnet NP a MyChart message in 1 week to check in on blood pressure.    To prevent or reduce lower extremity swelling: Eat a low salt diet. Salt makes the body hold onto extra fluid which causes swelling. Sit with legs elevated. For example, in the recliner or on an [place]  Wear knee-high compression stockings during the daytime. Ones labeled 15-20 mmHg provide good compression.  Orthostatic  Hypotension As your heart beats, it forces blood through your body. This force is called blood pressure. If you have hypotension, you have low blood pressure.  When your blood pressure is too low, you may not get enough blood to your brain or other parts of your body. This may cause you to feel weak, light-headed, have a fast heartbeat, or even faint. Low blood pressure may be harmless, or it may cause serious problems. What are the causes? Blood loss. Not enough water in the body (dehydration). Heart problems. Hormone problems. Pregnancy. A very bad infection. Not having enough of certain nutrients. Very bad allergic reactions. Certain medicines. What increases the risk? Age. The risk increases as you get older. Conditions that affect the heart or the brain and spinal cord (central nervous system). What are the signs or symptoms? Feeling: Weak. Light-headed. Dizzy. Tired (fatigued). Blurred vision. Fast heartbeat. Fainting, in very bad cases. How is this treated? Changing your diet. This may involve drinking more water or including more salt (sodium) in your diet by eating high-salt foods. Taking medicines to raise your blood pressure. Changing how much you take (the dosage) of some of your medicines. Wearing compression stockings. These stockings help to prevent blood clots and reduce swelling in your legs. In some cases, you may need to go to the hospital to: Receive fluids through an IV tube. Receive donated blood through an IV tube (transfusion). Get treated for an infection or heart problems, if this applies. Be monitored while medicines that you are taking wear off. Follow  these instructions at home: Eating and drinking  Drink enough fluids to keep your pee (urine) pale yellow. Eat a healthy diet. Follow instructions from your doctor about what you can eat or drink. A healthy diet includes: Fresh fruits and vegetables. Whole grains. Low-fat (lean) meats. Low-fat  dairy products. If told, include more salt in your diet. Do not add extra salt to your diet unless your doctor tells you to. Eat small meals often. Avoid standing up quickly after you eat. Medicines Take over-the-counter and prescription medicines only as told by your doctor. Follow instructions from your doctor about changing how much you take of your medicines, if this applies. Do not stop or change any of your medicines on your own. General instructions  Wear compression stockings as told by your doctor. Get up slowly from lying down or sitting. Avoid hot showers and a lot of heat as told by your doctor. Return to your normal activities when your doctor says that it is safe. Do not smoke or use any products that contain nicotine or tobacco. If you need help quitting, ask your doctor. Keep all follow-up visits. Contact a doctor if: You vomit. You have watery poop (diarrhea). You have a fever for more than 2-3 days. You feel more thirsty than normal. You feel weak and tired. Get help right away if: You have chest pain. You have a fast or uneven heartbeat. You lose feeling (have numbness) in any part of your body. You cannot move your arms or your legs. You have trouble talking. You get sweaty or feel light-headed. You faint. You have trouble breathing. You have trouble staying awake. You feel mixed up (confused). These symptoms may be an emergency. Get help right away. Call 911. Do not wait to see if the symptoms will go away. Do not drive yourself to the hospital. Summary Hypotension is also called low blood pressure. It is when the force of blood pumping through your body is too weak. Hypotension may be harmless, or it may cause serious problems. Treatment may include changing your diet and medicines, and wearing compression stockings. In very bad cases, you may need to go to the hospital. This information is not intended to replace advice given to you by your health care  provider. Make sure you discuss any questions you have with your health care provider. Document Revised: 02/08/2021 Document Reviewed: 02/08/2021 Elsevier Patient Education  [place].

## 2021-05-26 ENCOUNTER — Ambulatory Visit (INDEPENDENT_AMBULATORY_CARE_PROVIDER_SITE_OTHER): Payer: Medicare Other

## 2021-05-26 ENCOUNTER — Telehealth: Payer: Self-pay

## 2021-05-26 ENCOUNTER — Encounter (HOSPITAL_BASED_OUTPATIENT_CLINIC_OR_DEPARTMENT_OTHER): Payer: Self-pay | Admitting: Family

## 2021-05-26 ENCOUNTER — Telehealth: Payer: Self-pay | Admitting: Internal Medicine

## 2021-05-26 DIAGNOSIS — R2689 Other abnormalities of gait and mobility: Secondary | ICD-10-CM | POA: Diagnosis not present

## 2021-05-26 DIAGNOSIS — G3183 Dementia with Lewy bodies: Secondary | ICD-10-CM | POA: Diagnosis not present

## 2021-05-26 DIAGNOSIS — R413 Other amnesia: Secondary | ICD-10-CM | POA: Diagnosis not present

## 2021-05-26 DIAGNOSIS — M545 Low back pain, unspecified: Secondary | ICD-10-CM | POA: Diagnosis not present

## 2021-05-26 DIAGNOSIS — R279 Unspecified lack of coordination: Secondary | ICD-10-CM | POA: Diagnosis not present

## 2021-05-26 DIAGNOSIS — S300XXA Contusion of lower back and pelvis, initial encounter: Secondary | ICD-10-CM | POA: Diagnosis not present

## 2021-05-26 DIAGNOSIS — R41841 Cognitive communication deficit: Secondary | ICD-10-CM | POA: Diagnosis not present

## 2021-05-26 DIAGNOSIS — R410 Disorientation, unspecified: Secondary | ICD-10-CM | POA: Diagnosis not present

## 2021-05-26 DIAGNOSIS — R296 Repeated falls: Secondary | ICD-10-CM | POA: Diagnosis not present

## 2021-05-26 DIAGNOSIS — Z7409 Other reduced mobility: Secondary | ICD-10-CM | POA: Diagnosis not present

## 2021-05-26 DIAGNOSIS — F02A2 Dementia in other diseases classified elsewhere, mild, with psychotic disturbance: Secondary | ICD-10-CM | POA: Diagnosis not present

## 2021-05-26 NOTE — Telephone Encounter (Signed)
Patients wife has been notified of orders been placed and to come to [facility] office location before 4:30.

## 2021-05-26 NOTE — Telephone Encounter (Signed)
Called to speak to patient to send remote transmission.  Not able to remote transmission at this time. Needs assistance on sending. Direct phone number left and states will call when gets home to send.   Spoke to [place] (on [place]).

## 2021-05-26 NOTE — Telephone Encounter (Signed)
-----   Message from Jeff Dubonnet, Jeff Weaver sent at 05/26/2021  7:56 AM EST ----- Can we please do a remote check for Jeff Weaver? Episodes of "heart pounding". Also, daughter may just need some education on ensuring the home device is working properly. Looked like it was on my brief review in clinic but you all are the experts! Thank you!

## 2021-05-26 NOTE — Telephone Encounter (Signed)
Patient spouse [name] states patient fell this morning and is experiencing lower back pain w/ bruising making it difficult for patient to walk  Advised spouse I was going to transfer the call to a triage nurse, spouse declined stating she preferred to speak with the patient's provider or nurse to get an xray order  Spouse requesting a call back  Phone [phone]

## 2021-05-26 NOTE — Telephone Encounter (Signed)
Remote transmission received and reviewed. Normal device function. Optivol is creeping up but has no crossed threshold as of yet. Noted patient started on Spirolactone 12.5 mg QD yesterday. Patient education provided on low sodium diet. Aware to call with any further questions or concerns. Appreciative of call.

## 2021-05-26 NOTE — Telephone Encounter (Signed)
X-rays were ordered

## 2021-05-26 NOTE — Telephone Encounter (Signed)
Patient daughter called back for [place] to walk her through how to send a manual transmission. The transmission was sent and she would like to speak to a nurse to go over what was seen

## 2021-05-28 ENCOUNTER — Ambulatory Visit (INDEPENDENT_AMBULATORY_CARE_PROVIDER_SITE_OTHER): Payer: Medicare Other

## 2021-05-28 DIAGNOSIS — I420 Dilated cardiomyopathy: Secondary | ICD-10-CM

## 2021-05-30 LAB — CUP PACEART REMOTE DEVICE CHECK
Battery Remaining Longevity: 132 mo
Battery Voltage: 3.18 V
Brady Statistic AP VP Percent: 50.74 %
Brady Statistic AP VS Percent: 0.01 %
Brady Statistic AS VP Percent: 48.63 %
Brady Statistic AS VS Percent: 0.62 %
Brady Statistic RA Percent Paced: 51 %
Brady Statistic RV Percent Paced: 99.37 %
Date Time Interrogation Session: 20221123154732
Implantable Lead Implant Date: 20191230
Implantable Lead Implant Date: 20191230
Implantable Lead Implant Date: 20220907
Implantable Lead Location: 753858
Implantable Lead Location: 753859
Implantable Lead Location: 753860
Implantable Lead Model: 4598
Implantable Lead Model: 5076
Implantable Lead Model: 5076
Implantable Pulse Generator Implant Date: 20220907
Lead Channel Impedance Value: 285 Ohm
Lead Channel Impedance Value: 323 Ohm
Lead Channel Impedance Value: 380 Ohm
Lead Channel Impedance Value: 399 Ohm
Lead Channel Impedance Value: 399 Ohm
Lead Channel Impedance Value: 418 Ohm
Lead Channel Impedance Value: 494 Ohm
Lead Channel Impedance Value: 513 Ohm
Lead Channel Impedance Value: 608 Ohm
Lead Channel Impedance Value: 665 Ohm
Lead Channel Impedance Value: 665 Ohm
Lead Channel Impedance Value: 760 Ohm
Lead Channel Impedance Value: 779 Ohm
Lead Channel Impedance Value: 779 Ohm
Lead Channel Pacing Threshold Amplitude: 0.5 V
Lead Channel Pacing Threshold Amplitude: 0.625 V
Lead Channel Pacing Threshold Amplitude: 1.125 V
Lead Channel Pacing Threshold Pulse Width: 0.4 ms
Lead Channel Pacing Threshold Pulse Width: 0.4 ms
Lead Channel Pacing Threshold Pulse Width: 0.4 ms
Lead Channel Sensing Intrinsic Amplitude: 1.75 mV
Lead Channel Sensing Intrinsic Amplitude: 1.75 mV
Lead Channel Sensing Intrinsic Amplitude: 12.875 mV
Lead Channel Setting Pacing Amplitude: 1 V
Lead Channel Setting Pacing Amplitude: 1.5 V
Lead Channel Setting Pacing Amplitude: 2.5 V
Lead Channel Setting Pacing Pulse Width: 0.4 ms
Lead Channel Setting Pacing Pulse Width: 0.4 ms
Lead Channel Setting Sensing Sensitivity: 1.2 mV

## 2021-05-31 DIAGNOSIS — R410 Disorientation, unspecified: Secondary | ICD-10-CM | POA: Diagnosis not present

## 2021-05-31 DIAGNOSIS — R296 Repeated falls: Secondary | ICD-10-CM | POA: Diagnosis not present

## 2021-05-31 DIAGNOSIS — G3183 Dementia with Lewy bodies: Secondary | ICD-10-CM | POA: Diagnosis not present

## 2021-05-31 DIAGNOSIS — R413 Other amnesia: Secondary | ICD-10-CM | POA: Diagnosis not present

## 2021-05-31 DIAGNOSIS — R279 Unspecified lack of coordination: Secondary | ICD-10-CM | POA: Diagnosis not present

## 2021-05-31 DIAGNOSIS — R41841 Cognitive communication deficit: Secondary | ICD-10-CM | POA: Diagnosis not present

## 2021-06-01 NOTE — Progress Notes (Signed)
Remote pacemaker transmission.   

## 2021-06-04 DIAGNOSIS — R419 Unspecified symptoms and signs involving cognitive functions and awareness: Secondary | ICD-10-CM | POA: Diagnosis not present

## 2021-06-04 DIAGNOSIS — R410 Disorientation, unspecified: Secondary | ICD-10-CM | POA: Diagnosis not present

## 2021-06-04 DIAGNOSIS — R269 Unspecified abnormalities of gait and mobility: Secondary | ICD-10-CM | POA: Diagnosis not present

## 2021-06-04 DIAGNOSIS — R296 Repeated falls: Secondary | ICD-10-CM | POA: Diagnosis not present

## 2021-06-04 DIAGNOSIS — R29898 Other symptoms and signs involving the musculoskeletal system: Secondary | ICD-10-CM | POA: Diagnosis not present

## 2021-06-04 DIAGNOSIS — Z7409 Other reduced mobility: Secondary | ICD-10-CM | POA: Diagnosis not present

## 2021-06-04 DIAGNOSIS — I63512 Cerebral infarction due to unspecified occlusion or stenosis of left middle cerebral artery: Secondary | ICD-10-CM | POA: Diagnosis not present

## 2021-06-04 DIAGNOSIS — R4189 Other symptoms and signs involving cognitive functions and awareness: Secondary | ICD-10-CM | POA: Diagnosis not present

## 2021-06-04 DIAGNOSIS — G3183 Dementia with Lewy bodies: Secondary | ICD-10-CM | POA: Diagnosis not present

## 2021-06-04 DIAGNOSIS — F02A2 Dementia in other diseases classified elsewhere, mild, with psychotic disturbance: Secondary | ICD-10-CM | POA: Diagnosis not present

## 2021-06-04 DIAGNOSIS — R41841 Cognitive communication deficit: Secondary | ICD-10-CM | POA: Diagnosis not present

## 2021-06-04 DIAGNOSIS — R413 Other amnesia: Secondary | ICD-10-CM | POA: Diagnosis not present

## 2021-06-04 DIAGNOSIS — R279 Unspecified lack of coordination: Secondary | ICD-10-CM | POA: Diagnosis not present

## 2021-06-04 DIAGNOSIS — Z789 Other specified health status: Secondary | ICD-10-CM | POA: Diagnosis not present

## 2021-06-08 DIAGNOSIS — Z8669 Personal history of other diseases of the nervous system and sense organs: Secondary | ICD-10-CM | POA: Diagnosis not present

## 2021-06-08 DIAGNOSIS — F0390 Unspecified dementia without behavioral disturbance: Secondary | ICD-10-CM | POA: Diagnosis not present

## 2021-06-08 DIAGNOSIS — G20 Parkinson's disease: Secondary | ICD-10-CM | POA: Diagnosis not present

## 2021-06-11 ENCOUNTER — Other Ambulatory Visit: Payer: Self-pay | Admitting: Internal Medicine

## 2021-06-11 DIAGNOSIS — I10 Essential (primary) hypertension: Secondary | ICD-10-CM

## 2021-06-11 DIAGNOSIS — R410 Disorientation, unspecified: Secondary | ICD-10-CM | POA: Diagnosis not present

## 2021-06-11 DIAGNOSIS — R419 Unspecified symptoms and signs involving cognitive functions and awareness: Secondary | ICD-10-CM | POA: Diagnosis not present

## 2021-06-11 DIAGNOSIS — R279 Unspecified lack of coordination: Secondary | ICD-10-CM | POA: Diagnosis not present

## 2021-06-11 DIAGNOSIS — G3183 Dementia with Lewy bodies: Secondary | ICD-10-CM | POA: Diagnosis not present

## 2021-06-11 DIAGNOSIS — R413 Other amnesia: Secondary | ICD-10-CM | POA: Diagnosis not present

## 2021-06-11 DIAGNOSIS — K219 Gastro-esophageal reflux disease without esophagitis: Secondary | ICD-10-CM

## 2021-06-11 DIAGNOSIS — I25118 Atherosclerotic heart disease of native coronary artery with other forms of angina pectoris: Secondary | ICD-10-CM

## 2021-06-11 DIAGNOSIS — I63512 Cerebral infarction due to unspecified occlusion or stenosis of left middle cerebral artery: Secondary | ICD-10-CM | POA: Diagnosis not present

## 2021-06-11 MED ORDER — OMEPRAZOLE 40 MG PO CPDR
40.0000 mg | DELAYED_RELEASE_CAPSULE | Freq: Every day | ORAL | 3 refills | Status: DC | PRN
Start: 2021-06-11 — End: 2022-03-21

## 2021-06-14 ENCOUNTER — Emergency Department (INDEPENDENT_AMBULATORY_CARE_PROVIDER_SITE_OTHER)
Admission: EM | Admit: 2021-06-14 | Discharge: 2021-06-14 | Disposition: A | Discharge status: Home / Self Care | Payer: Medicare Other | Source: Home / Self Care

## 2021-06-14 ENCOUNTER — Other Ambulatory Visit (HOSPITAL_BASED_OUTPATIENT_CLINIC_OR_DEPARTMENT_OTHER): Payer: Self-pay

## 2021-06-14 ENCOUNTER — Emergency Department (INDEPENDENT_AMBULATORY_CARE_PROVIDER_SITE_OTHER): Payer: Medicare Other

## 2021-06-14 ENCOUNTER — Other Ambulatory Visit: Payer: Self-pay

## 2021-06-14 ENCOUNTER — Encounter: Payer: Self-pay | Admitting: Emergency Medicine

## 2021-06-14 DIAGNOSIS — R059 Cough, unspecified: Secondary | ICD-10-CM

## 2021-06-14 MED ORDER — CEFDINIR 300 MG PO CAPS
300.0000 mg | ORAL_CAPSULE | Freq: Two times a day (BID) | ORAL | 0 refills | Status: AC
  Filled 2021-06-14: qty 14, 7d supply, fill #0

## 2021-06-14 MED ORDER — BENZONATATE 200 MG PO CAPS
200.0000 mg | ORAL_CAPSULE | Freq: Three times a day (TID) | ORAL | 0 refills | Status: AC | PRN
  Filled 2021-06-14: qty 40, 14d supply, fill #0

## 2021-06-14 NOTE — ED Triage Notes (Signed)
Cough X 10 days worse x 3 days Runny nose Vaccinated for Covid No flu

## 2021-06-14 NOTE — Discharge Instructions (Addendum)
Advised patient and wife of CXR results today.  Advised patient take medication as directed with food to completion.  Advised patient may take Tessalon Perles daily or as needed for cough. Encouraged patient toincrease daily water intake while taking these medications.

## 2021-06-14 NOTE — ED Provider Notes (Signed)
Jeff Weaver CARE    CSN: [id number] Arrival date & time: 06/14/21  1209      History   Chief Complaint Chief Complaint  Patient presents with   Cough    HPI Jeff Weaver is a 75 y.o. male.   HPI 75 year old male presents with cough for 10 days worse over the past 3 days and currently has runny nose. PMH significant for cerebral aneurysm, chronic diastolic CHF, and pacemaker present.  Patient was a former cigarette smoker with 30-pack-year history.  Patient is accompanied by his wife this afternoon.  Past Medical History:  Diagnosis Date   Aortic insufficiency    a. mild 2017 echo.   Atypical chest pain    Cerebral aneurysm    a. s/p clipping at [facility] in 8/13 c/b short-term memory loss, cerebral hemorrhage and seizure disorder s/p VP shunt.   Cerebral hemorrhage (HCC)    Chronic diastolic CHF (congestive heart failure) (HCC)    CVA (cerebral infarction)    Diverticulosis    Enteritis (regional)    Dr Olevia Perches   First degree AV block    GI bleed    HTN (hypertension)    Hyperlipidemia    Hypertensive heart disease    IBS (irritable bowel syndrome)    Mild CAD    Cardiac cath 01/12/18 showed minimal nonobstructive CAD, normal LVEF, normal LVEDP.   NSVT (nonsustained ventricular tachycardia)    Orthostatic hypotension    Pacemaker    Pre-diabetes    PVC's (premature ventricular contractions)    Regional enteritis of large intestine ([place]) since 1978   Rheumatoid arthritis(714.0)    dxed in [employer] in 1980s   Second degree AV block, Mobitz type I    Seizures ([place])    last April 26,2021   Sinus bradycardia    Sleep apnea    SSS (sick sinus syndrome) ([place])    a. s/p PPM 07/02/18.    Patient Active Problem List   Diagnosis Date Noted   Cough 04/09/2021   Vitamin D deficiency 04/09/2021   Vitamin B1 deficiency 04/09/2021   Right arm pain 01/06/2021   Head injury 11/11/2020   Abrasion of leg with infection, left, initial encounter 10/28/2020   Seizures ([place])     Muscle cramping 12/30/2019   Dizziness 12/30/2019   Unsteady gait 12/30/2019   Pacemaker 11/27/2019   Fatigue 07/09/2019   SSS (sick sinus syndrome) ([place]) 07/02/2018   Bilateral shoulder pain 06/14/2018   Upper airway cough syndrome vs cough variant asthma 03/20/2018   Acute midline low back pain without sciatica 12/25/2017   Depression 10/17/2017   CAD (coronary artery disease), hx of NSTEMI 08/15/2017   Chest pain 07/17/2017   SOBOE (shortness of breath on exertion) 06/08/2017   Bilateral sensorineural hearing loss 06/01/2017   Nasal turbinate hypertrophy 06/01/2017   Dysphagia 05/31/2017   Osteoporosis 01/30/2017   History of cerebral hemorrhage 12/28/2016   Seborrheic dermatitis 07/25/2016   Cognitive communication deficit 06/28/2016   Hypertensive heart disease 06/28/2016   Vitamin B 12 deficiency 03/16/2016   Memory difficulties 03/13/2016   Confusion 03/13/2016   VP (ventriculoperitoneal) shunt status 02/12/2016   Orthostatic hypotension 01/05/2016   Benign prostatic hyperplasia with urinary obstruction 08/04/2014   Urinary urgency 08/04/2014   Gastroesophageal reflux disease 07/14/2014   Polymyalgia rheumatica ([place]) 05/28/2014   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/2014   SBE (subacute bacterial endocarditis) prophylaxis candidate 11/24/2013   Aphasia 07/17/2013   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 seizures, not intractable,  without status epilepticus ([place]) 07/17/2013   Subdural hemorrhage ([place]) 07/17/2013   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/2013   Pre-diabetes 03/26/2013   Increased frequency of urination 10/31/2012   Nocturia 10/31/2012   History of cerebral aneurysm repair 10/18/2012   History of stroke without residual deficits 10/18/2012   Blepharitis of both eyes 09/27/2012   Ocular rosacea 09/27/2012   Seizure disorder ([place]) 07/23/2012   Entropion 07/10/2012   Hyperopia with astigmatism 07/10/2012   Combined senile  cataract 04/13/2012   Presbyopia 04/13/2012   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 artery ([place]) 03/01/2012   Cognitive safety issue 02/20/2012   Nonruptured cerebral aneurysm 11/23/2011   Obstructive sleep apnea syndrome 08/09/2011   Hyperlipidemia 02/03/2009   HTN (hypertension) 02/03/2009   Osteoarthritis 02/03/2009   History of Crohn's disease 04/17/2008    Past Surgical History:  Procedure Laterality Date   BIV PACEMAKER INSERTION CRT-P N/A 03/10/2021   Procedure: BIV PACEMAKER INSERTION CRT-P UPGRADE;  Surgeon: Constance Haw, MD;  Location: [facility];  Service: Cardiovascular;  Laterality: N/A;   CHOLECYSTECTOMY N/A 01/07/2016   Procedure: LAPAROSCOPIC CHOLECYSTECTOMY ;  Surgeon: Coralie Keens, MD;  Location: [facility];  Service: General;  Laterality: N/A;   cns shunt  02/23/12   COLONOSCOPY W/ POLYPECTOMY  1978   negative 2009, Dr Olevia Perches. Due 2014   CRANIOTOMY  02/02/12   Dr Harvel Ricks, [place] of aneurysm   CRANIOTOMY  02-02-12   left pterional craniotomy for clipping complex anterior communicating artery aneurysm    HERNIA REPAIR     LEFT HEART CATH AND CORONARY ANGIOGRAPHY N/A 01/12/2018   Procedure: LEFT HEART CATH AND CORONARY ANGIOGRAPHY;  Surgeon: Martinique, Peter M, MD;  Location: [facility];  Service: Cardiovascular;  Laterality: N/A;   LEFT HEART CATHETERIZATION WITH CORONARY ANGIOGRAM N/A 11/26/2013   Procedure: LEFT HEART CATHETERIZATION WITH CORONARY ANGIOGRAM;  Surgeon: [name], MD;  Location: [facility];  Service: Cardiovascular;  Laterality: N/A;   NOSE SURGERY     PACEMAKER IMPLANT N/A 07/02/2018   Procedure: PACEMAKER IMPLANT;  Surgeon: Constance Haw, MD;  Location: [facility];  Service: Cardiovascular;  Laterality: N/A;   SHOULDER SURGERY  1997   TONSILLECTOMY AND ADENOIDECTOMY     VENTRICULOPERITONEAL SHUNT  02-23-12   INSERTION OF RIGHT FRONTAL VENTRICULOPERITONEAL SHUNT WITH A CODMAN HAKIM PROGRAMMABLE VALVE        Home Medications    Prior to Admission medications   Medication Sig Start Date End Date Taking? Authorizing Provider  benzonatate (TESSALON) 200 MG capsule Take 1 capsule (200 mg total) by mouth 3 (three) times daily as needed for up to 7 days for cough. 06/14/21 06/21/21 Yes Eliezer Lofts, FNP  cefdinir (OMNICEF) 300 MG capsule Take 1 capsule (300 mg total) by mouth 2 (two) times daily for 7 days. 06/14/21 06/21/21 Yes Eliezer Lofts, FNP  acetaminophen (TYLENOL) 325 MG tablet Take 325-650 mg by mouth every 6 (six) hours as needed (pain/headaches.).    [provider]  aspirin 325 MG EC tablet Take 325-650 mg by mouth daily as needed for pain.    [provider]  aspirin EC 81 MG tablet Take 1 tablet (81 mg total) by mouth daily. Patient taking differently: Take 81 mg by mouth in the morning. 07/12/17   Dorothy Spark, MD  cetirizine (ZYRTEC) 10 MG tablet Take 1 tablet (10 mg total) by mouth daily. Patient taking differently: Take 10 mg by mouth in the morning.  10/26/15   [name], MD  Cholecalciferol (VITAMIN D) 50 MCG (2000 UT) CAPS Take 2,000 Units by mouth in the morning.    [provider]  clonazePAM (KLONOPIN) 1 MG disintegrating tablet Take 1 mg by mouth as directed. ALLOW 1 TABLET TO DISSOLVE IN CHEEK FOR SEIZURES LASTING LONGER THAN 4 MINUTES. OR AFTER 2ND SEIZURE IN 24 HOURS 11/05/19 01/16/23  [provider]  desonide (DESOWEN) 0.05 % ointment Apply 1 application topically 2 (two) times daily as needed (skin irritation).    [provider]  donepezil (ARICEPT) 10 MG tablet Take half a tablet in the morning for a month and then increase to 1 full tablet daily. 05/06/21     furosemide (LASIX) 40 MG tablet TAKE 1 TABLET (40 MG TOTAL) BY MOUTH DAILY. Patient taking differently: Take 40 mg by mouth in the morning. 11/16/20 11/16/21  Sueanne Margarita, MD  levETIRAcetam (KEPPRA) 500 MG tablet Take 1,000-1,500 mg by mouth See admin  instructions. Take 2 tablets (1000 mg) by mouth in the morning & take 3 tablets (1500 mg) by mouth in the evening    [provider]  LORazepam (ATIVAN) 1 MG tablet Take 1 mg by mouth every 6 (six) hours as needed for seizure.     [provider]  menthol-zinc oxide (GOLD BOND) powder Apply 1 application topically 2 (two) times daily as needed (itching/irritation).    [provider]  miconazole (ZEASORB-AF) 2 % powder Apply 1 application topically as needed for itching.    [provider]  Midazolam (NAYZILAM) 5 MG/0.1ML SOLN Place 1 each into the nose as needed (seizure).    [provider]  mirabegron ER (MYRBETRIQ) 50 MG TB24 tablet Take 1 tablet (50 mg total) by mouth daily. Patient taking differently: Take 50 mg by mouth every evening. 02/28/20   Burns, Claudina Lick, MD  nitroGLYCERIN (NITROSTAT) 0.4 MG SL tablet PLACE 1 TABLET (0.4 MG TOTAL) UNDER THE TONGUE EVERY 5 (FIVE) MINUTES AS NEEDED FOR CHEST PAIN. Patient taking differently: Place 0.4 mg under the tongue every 5 (five) minutes x 3 doses as needed for chest pain. 08/25/20 08/25/21  Dorothy Spark, MD  NON FORMULARY CPAP Machine: authorized by [facility] in [facility]     [provider]  omeprazole (PRILOSEC) 40 MG capsule Take 1 capsule (40 mg total) by mouth daily as needed. 06/11/21   [name], MD  Propylene Glycol (SYSTANE BALANCE) 0.6 % SOLN Apply 1 drop to eye daily as needed (dry eyes).    [provider]  pyridOXINE (VITAMIN B-6) 100 MG tablet Take 100 mg by mouth in the morning.    [provider]  rosuvastatin (CRESTOR) 5 MG tablet TAKE 1 TABLET BY MOUTH THREE TIMES A WEEK AND INCREASE AS TOLERATED Patient taking differently: Take 5 mg by mouth every Monday, Wednesday, and Friday. In the evening 11/16/20 11/16/21  Sueanne Margarita, MD  spironolactone (ALDACTONE) 25 MG tablet Take 0.5 tablets (12.5 mg total) by mouth daily. 05/25/21   Loel Dubonnet, NP   spironolactone (ALDACTONE) 25 MG tablet Take 0.5 tablets (12.5 mg total) by mouth daily. 05/25/21 11/21/21  Loel Dubonnet, NP  Tamsulosin HCl (FLOMAX) 0.4 MG CAPS Take 0.4 mg by mouth every evening.    [provider]  thiamine 100 MG tablet Take 100 mg by mouth in the morning.    [provider]  tolnaftate (TINACTIN) 1 % spray Apply topically 2 (two) times daily as  needed (itching).    [provider]  traZODone (DESYREL) 50 MG tablet Take 1 tablet by mouth at bedtime. 04/29/21   [provider]  VIMPAT 200 MG TABS tablet Take 200 mg by mouth 2 (two) times daily. 09/03/19   [provider]  vitamin B-12 (CYANOCOBALAMIN) 1000 MCG tablet Take 1,000 mcg by mouth in the morning.    [provider]    Family History Family History  Problem Relation Age of Onset   COPD Father    Coronary artery disease Father        MI in 26s   Hepatitis Mother    Diabetes Sister    Dementia Sister 5   Diabetes Brother    Colon cancer Neg Hx    Esophageal cancer Neg Hx    Rectal cancer Neg Hx    Stomach cancer Neg Hx    Colon polyps Neg Hx     Social History Social History   Tobacco Use   Smoking status: Former    Packs/day: 0.00    Years: 30.00    Pack years: 0.00    Types: Pipe, Cigarettes    Quit date: 07/04/2008    Years since quitting: 12.9   Smokeless tobacco: Former  Scientific laboratory technician Use: Never used  Substance Use Topics   Alcohol use: Yes    Alcohol/week: 0.0 standard drinks    Comment: Very Infrequently    Drug use: No     Allergies   Haloperidol, Rosuvastatin, Simvastatin, Lisinopril, Nifedipine, and Pravastatin   Review of Systems Review of Systems  HENT:  Positive for congestion.   Respiratory:  Positive for cough. Negative for choking.   All other systems reviewed and are negative.   Physical Exam Triage Vital Signs ED Triage Vitals  Enc Vitals Group     BP 06/14/21 1244 135/79     Pulse Rate 06/14/21  1244 68     Resp 06/14/21 1244 16     Temp 06/14/21 1244 98.8 F (37.1 C)     Temp Source 06/14/21 1244 Oral     SpO2 06/14/21 1244 97 %     Weight 06/14/21 1245 236 lb (107 kg)     Height 06/14/21 1245 5' 8"  (1.727 m)     Head Circumference --      Peak Flow --      Pain Score 06/14/21 1245 0     Pain Loc --      Pain Edu? --      Excl. in [place]? --    No data found.  Updated Vital Signs BP 135/79 (BP Location: Left Arm)   Pulse 68   Temp 98.8 F (37.1 C) (Oral)   Resp 16   Ht 5' 8"  (1.727 m)   Wt 236 lb (107 kg)   SpO2 97%   BMI 35.88 kg/m   Physical Exam Vitals and nursing note reviewed.  Constitutional:      General: He is not in acute distress.    Appearance: Normal appearance. He is obese. He is not ill-appearing.  HENT:     Head: Normocephalic and atraumatic.     Right Ear: Tympanic membrane, ear canal and external ear normal.     Left Ear: Tympanic membrane, ear canal and external ear normal.     Mouth/Throat:     Mouth: Mucous membranes are moist.     Pharynx: Oropharynx is clear.  Eyes:     Extraocular Movements: Extraocular movements intact.  Conjunctiva/sclera: Conjunctivae normal.     Pupils: Pupils are equal, round, and reactive to light.  Cardiovascular:     Rate and Rhythm: Normal rate and regular rhythm.     Pulses: Normal pulses.     Heart sounds: Normal heart sounds. No murmur heard.    Comments: Paced Pulmonary:     Effort: Pulmonary effort is normal.     Breath sounds: No wheezing, rhonchi or rales.     Comments: Diminished breath sounds noted throughout. Skin:    General: Skin is warm and dry.  Neurological:     General: No focal deficit present.     Mental Status: He is alert and oriented to person, place, and time. Mental status is at baseline.     UC Treatments / Results  Labs (all labs ordered are listed, but only abnormal results are displayed) Labs Reviewed - No data to display  EKG   Radiology DG Chest 2  View  Result Date: 06/14/2021 CLINICAL DATA:  Cough EXAM: CHEST - 2 VIEW COMPARISON:  04/09/2021 FINDINGS: Improved lung aeration at the lung bases. There is mild basilar atelectasis/scarring. No pleural effusion. No pneumothorax. Stable cardiomediastinal contours. Left chest wall triple lead pacemaker. Shunt catheter traverses the left chest toward the right abdomen. IMPRESSION: No acute process in the chest.  Mild bibasilar atelectasis/scarring. Electronically Signed   By: Macy Mis M.D.   On: 06/14/2021 13:58    Procedures Procedures (including critical care time)  Medications Ordered in UC Medications - No data to display  Initial Impression / Assessment and Plan / UC Course  I have reviewed the triage vital signs and the nursing notes.  Pertinent labs & imaging results that were available during my care of the patient were reviewed by me and considered in my medical decision making (see chart for details).     MDM: 1.  Cough-CXR revealed only mild bibasilar atelectasis/scarring, Rx Cefdinir and Tessalon Perles. Advised patient and wife of CXR results today.  Advised patient take medication as directed with food to completion.  Advised patient may take Tessalon Perles daily or as needed for cough. Encouraged patient to increase daily water intake while taking these medications.  Patient discharged home, hemodynamically stable. Final Clinical Impressions(s) / UC Diagnoses   Final diagnoses:  Cough, unspecified type     Discharge Instructions      Advised patient and wife of CXR results today.  Advised patient take medication as directed with food to completion.  Advised patient may take Tessalon Perles daily or as needed for cough. Encouraged patient toincrease daily water intake while taking these medications.     ED Prescriptions     Medication Sig Dispense Auth. Provider   cefdinir (OMNICEF) 300 MG capsule Take 1 capsule (300 mg total) by mouth 2 (two) times daily for  7 days. 14 capsule Eliezer Lofts, FNP   benzonatate (TESSALON) 200 MG capsule Take 1 capsule (200 mg total) by mouth 3 (three) times daily as needed for up to 7 days for cough. 40 capsule Eliezer Lofts, FNP      PDMP not reviewed this encounter.   Eliezer Lofts, [place] 06/14/21 1450

## 2021-06-18 ENCOUNTER — Other Ambulatory Visit: Payer: Self-pay

## 2021-06-18 ENCOUNTER — Encounter: Payer: Self-pay | Admitting: Cardiology

## 2021-06-18 ENCOUNTER — Ambulatory Visit (INDEPENDENT_AMBULATORY_CARE_PROVIDER_SITE_OTHER): Payer: Medicare Other | Admitting: Cardiology

## 2021-06-18 VITALS — BP 136/78 | HR 62 | Ht 68.0 in | Wt 235.2 lb

## 2021-06-18 DIAGNOSIS — I428 Other cardiomyopathies: Secondary | ICD-10-CM

## 2021-06-18 DIAGNOSIS — I2510 Atherosclerotic heart disease of native coronary artery without angina pectoris: Secondary | ICD-10-CM

## 2021-06-18 LAB — CUP PACEART INCLINIC DEVICE CHECK
Battery Remaining Longevity: 131 mo
Battery Voltage: 3.17 V
Brady Statistic AP VP Percent: 50.18 %
Brady Statistic AP VS Percent: 0.01 %
Brady Statistic AS VP Percent: 49.43 %
Brady Statistic AS VS Percent: 0.38 %
Brady Statistic RA Percent Paced: 50.32 %
Brady Statistic RV Percent Paced: 99.61 %
Date Time Interrogation Session: 20221216155403
Implantable Lead Implant Date: 20191230
Implantable Lead Implant Date: 20191230
Implantable Lead Implant Date: 20220907
Implantable Lead Location: 753858
Implantable Lead Location: 753859
Implantable Lead Location: 753860
Implantable Lead Model: 4598
Implantable Lead Model: 5076
Implantable Lead Model: 5076
Implantable Pulse Generator Implant Date: 20220907
Lead Channel Impedance Value: 323 Ohm
Lead Channel Impedance Value: 342 Ohm
Lead Channel Impedance Value: 399 Ohm
Lead Channel Impedance Value: 399 Ohm
Lead Channel Impedance Value: 418 Ohm
Lead Channel Impedance Value: 437 Ohm
Lead Channel Impedance Value: 475 Ohm
Lead Channel Impedance Value: 513 Ohm
Lead Channel Impedance Value: 608 Ohm
Lead Channel Impedance Value: 684 Ohm
Lead Channel Impedance Value: 684 Ohm
Lead Channel Impedance Value: 722 Ohm
Lead Channel Impedance Value: 722 Ohm
Lead Channel Impedance Value: 817 Ohm
Lead Channel Pacing Threshold Amplitude: 0.75 V
Lead Channel Pacing Threshold Amplitude: 0.75 V
Lead Channel Pacing Threshold Amplitude: 1.25 V
Lead Channel Pacing Threshold Pulse Width: 0.4 ms
Lead Channel Pacing Threshold Pulse Width: 0.4 ms
Lead Channel Pacing Threshold Pulse Width: 0.4 ms
Lead Channel Sensing Intrinsic Amplitude: 1.625 mV
Lead Channel Sensing Intrinsic Amplitude: 12.875 mV
Lead Channel Sensing Intrinsic Amplitude: 2 mV
Lead Channel Setting Pacing Amplitude: 1.25 V
Lead Channel Setting Pacing Amplitude: 1.5 V
Lead Channel Setting Pacing Amplitude: 2.5 V
Lead Channel Setting Pacing Pulse Width: 0.4 ms
Lead Channel Setting Pacing Pulse Width: 0.4 ms
Lead Channel Setting Sensing Sensitivity: 1.2 mV

## 2021-06-18 NOTE — Progress Notes (Signed)
Electrophysiology Office Note   Date:  06/18/2021   ID:  Jeff Weaver, Jeff Weaver 11/08/1945, MRN [id number]  PCP:  [name], MD  Cardiologist:  Radford Pax Primary Electrophysiologist:  Cayce Quezada Meredith Leeds, MD    No chief complaint on file.    History of Present Illness: Jeff Weaver is a 75 y.o. male who is being seen today for the evaluation of bradycardia at the request of [name]. Presenting today for electrophysiology evaluation.    He has a history significant for obesity, orthostasis, hypertension, prediabetes, cerebral aneurysm post clipping, seizure disorder status post VP shunt, sleep apnea, aortic insufficiency.  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  He developed systolic dysfunction potentially due to RV pacing and is now status post Medtronic CRT-P upgrade on 03/10/2021.  Today, denies symptoms of palpitations, chest pain, shortness of breath, orthopnea, PND, lower extremity edema, claudication, dizziness, presyncope, syncope, bleeding, or neurologic sequela. The patient is tolerating medications without difficulties.  From a cardiac perspective he has been doing well since his device was upgraded.  He has more energy and less fatigue.  Unfortunately, he has started having more issues with dementia.  He had a COVID shot since the device was implanted and his dementia worsened thereafter.  He is currently undergoing work-up to determine the cause of his dementia.  Past Medical History:  Diagnosis Date   Aortic insufficiency    a. mild 2017 echo.   Atypical chest pain    Cerebral aneurysm    a. s/p clipping at [facility] in 8/13 c/b short-term memory loss, cerebral hemorrhage and seizure disorder s/p VP shunt.   Cerebral hemorrhage (HCC)    Chronic diastolic CHF (congestive heart failure) (HCC)    CVA (cerebral infarction)    Diverticulosis    Enteritis (regional)    Dr Olevia Perches   First degree AV  block    GI bleed    HTN (hypertension)    Hyperlipidemia    Hypertensive heart disease    IBS (irritable bowel syndrome)    Mild CAD    Cardiac cath 01/12/18 showed minimal nonobstructive CAD, normal LVEF, normal LVEDP.   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 s/p PPM 07/02/18.   Past Surgical History:  Procedure Laterality Date   BIV PACEMAKER INSERTION CRT-P N/A 03/10/2021   Procedure: BIV PACEMAKER INSERTION CRT-P UPGRADE;  Surgeon: Constance Haw, MD;  Location: [place] [facility];  Service: Cardiovascular;  Laterality: N/A;   CHOLECYSTECTOMY N/A 01/07/2016   Procedure: LAPAROSCOPIC CHOLECYSTECTOMY ;  Surgeon: Coralie Keens, MD;  Location: [place];  Service: General;  Laterality: N/A;   cns shunt  02/23/12   COLONOSCOPY W/ POLYPECTOMY  1978   negative 2009, Dr Olevia Perches. Due 2014   CRANIOTOMY  02/02/12   Dr Harvel Ricks, [place] of aneurysm   CRANIOTOMY  02-02-12   left pterional craniotomy for clipping complex anterior communicating artery aneurysm    HERNIA REPAIR     LEFT HEART  CATH AND CORONARY ANGIOGRAPHY N/A 01/12/2018   Procedure: LEFT HEART CATH AND CORONARY ANGIOGRAPHY;  Surgeon: Martinique, Peter M, MD;  Location: [place] [facility];  Service: Cardiovascular;  Laterality: N/A;   LEFT HEART CATHETERIZATION WITH CORONARY ANGIOGRAM N/A 11/26/2013   Procedure: LEFT HEART CATHETERIZATION WITH CORONARY ANGIOGRAM;  Surgeon: [name], MD;  Location: [facility];  Service: Cardiovascular;  Laterality: N/A;   NOSE SURGERY     PACEMAKER IMPLANT N/A 07/02/2018   Procedure: PACEMAKER IMPLANT;  Surgeon:  Constance Haw, MD;  Location: [place] [facility];  Service: Cardiovascular;  Laterality: N/A;   SHOULDER SURGERY  1997   TONSILLECTOMY AND ADENOIDECTOMY     VENTRICULOPERITONEAL SHUNT  02-23-12   INSERTION OF RIGHT FRONTAL VENTRICULOPERITONEAL SHUNT WITH A CODMAN HAKIM PROGRAMMABLE VALVE     Current Outpatient Medications  Medication Sig Dispense Refill   acetaminophen (TYLENOL) 325 MG tablet Take 325-650 mg by mouth every 6 (six) hours as needed (pain/headaches.).     aspirin EC 81 MG tablet Take 1 tablet (81 mg total) by mouth daily. 90 tablet 3   benzonatate (TESSALON) 200 MG capsule Take 1 capsule (200 mg total) by mouth 3 (three) times daily as needed for up to 7 days for cough. 40 capsule 0   cefdinir (OMNICEF) 300 MG capsule Take 1 capsule (300 mg total) by mouth 2 (two) times daily for 7 days. 14 capsule 0   cetirizine (ZYRTEC) 10 MG tablet Take 1 tablet (10 mg total) by mouth daily. (Patient taking differently: Take 10 mg by mouth in the morning.) 30 tablet 11   Cholecalciferol (VITAMIN D) 50 MCG (2000 UT) CAPS Take 2,000 Units by mouth in the morning.     clonazePAM (KLONOPIN) 1 MG disintegrating tablet Take 1 mg by mouth as directed. ALLOW 1 TABLET TO DISSOLVE IN CHEEK FOR SEIZURES LASTING LONGER THAN 4 MINUTES. OR AFTER 2ND SEIZURE IN 24 HOURS     desonide (DESOWEN) 0.05 % ointment Apply 1 application topically 2 (two) times daily as needed (skin irritation).     donepezil (ARICEPT) 10 MG tablet Take half a tablet in the morning for a month and then increase to 1 full tablet daily. 30 tablet 5   furosemide (LASIX) 40 MG tablet TAKE 1 TABLET (40 MG TOTAL) BY MOUTH DAILY. (Patient taking differently: Take 40 mg by mouth in the morning.) 90 tablet 3   levETIRAcetam (KEPPRA) 500 MG tablet Take 1,000-1,500 mg by mouth See admin instructions. Take 2 tablets (1000 mg) by mouth in the morning & take 3 tablets (1500 mg) by mouth in the evening     LORazepam (ATIVAN) 1 MG tablet Take 1  mg by mouth every 6 (six) hours as needed for seizure.      menthol-zinc oxide (GOLD BOND) powder Apply 1 application topically 2 (two) times daily as needed (itching/irritation).     miconazole (ZEASORB-AF) 2 % powder Apply 1 application topically as needed for itching.     Midazolam (NAYZILAM) 5 MG/0.1ML SOLN Place 1 each into the nose as needed (seizure).     mirabegron ER (MYRBETRIQ) 50 MG TB24 tablet Take 1 tablet (50 mg total) by mouth daily. (Patient taking differently: Take 50 mg by mouth every evening.) 90 tablet 1   nitroGLYCERIN (NITROSTAT) 0.4 MG SL tablet PLACE 1 TABLET (0.4 MG TOTAL) UNDER THE TONGUE EVERY 5 (FIVE) MINUTES AS NEEDED FOR CHEST PAIN. (Patient taking differently: Place 0.4 mg under the tongue every 5 (five) minutes  x 3 doses as needed for chest pain.) 25 tablet 7   NON FORMULARY CPAP Machine: authorized by [facility] in [place]      omeprazole (PRILOSEC) 40 MG capsule Take 1 capsule (40 mg total) by mouth daily as needed. 90 capsule 3   Propylene Glycol (SYSTANE BALANCE) 0.6 % SOLN Apply 1 drop to eye daily as needed (dry eyes).     pyridOXINE (VITAMIN B-6) 100 MG tablet Take 100 mg by mouth in the morning.     rosuvastatin (CRESTOR) 5 MG tablet TAKE 1 TABLET BY MOUTH THREE TIMES A WEEK AND INCREASE AS TOLERATED (Patient taking differently: Take 5 mg by mouth every Monday, Wednesday, and Friday. In the evening) 90 tablet 3   spironolactone (ALDACTONE) 25 MG tablet Take 0.5 tablets (12.5 mg total) by mouth daily. 15 tablet 0   Tamsulosin HCl (FLOMAX) 0.4 MG CAPS Take 0.4 mg by mouth every evening.     thiamine 100 MG tablet Take 100 mg by mouth in the morning.     tolnaftate (TINACTIN) 1 % spray Apply topically 2 (two) times daily as needed (itching).     traZODone (DESYREL) 50 MG tablet Take 1 tablet by mouth at bedtime.     VIMPAT 200 MG TABS tablet Take 200 mg by mouth 2 (two) times daily.     vitamin B-12 (CYANOCOBALAMIN) 1000 MCG tablet Take 1,000 mcg by mouth in the morning.      aspirin 325 MG EC tablet Take 325-650 mg by mouth daily as needed for pain. (Patient not taking: Reported on 06/18/2021)     spironolactone (ALDACTONE) 25 MG tablet Take 0.5 tablets (12.5 mg total) by mouth daily. (Patient not taking: Reported on 06/18/2021) 45 tablet 1   No current facility-administered medications for this visit.    Allergies:   Haloperidol, Rosuvastatin, Simvastatin, Lisinopril, Nifedipine, and Pravastatin   Social History:  The patient  reports that he quit smoking about 12 years ago. His smoking use included pipe and cigarettes. He has quit using smokeless tobacco. He reports current alcohol use. He reports that he does not use drugs.   Family History:  The patient's family history includes COPD in his father; Coronary artery disease in his father; Dementia (age of onset: 22) in his sister; Diabetes in his brother and sister; Hepatitis in his mother.   ROS:  Please see the history of present illness.   Otherwise, review of systems is positive for none.   All other systems are reviewed and negative.   PHYSICAL EXAM: VS:  BP 136/78    Pulse 62    Ht 5' 8"  (1.727 m)    Wt 235 lb 3.2 oz (106.7 kg)    SpO2 96%    BMI 35.76 kg/m  , BMI Body mass index is 35.76 kg/m. GEN: Well nourished, well developed, in no acute distress  HEENT: normal  Neck: no JVD, carotid bruits, or masses Cardiac: RRR; no murmurs, rubs, or gallops,no edema  Respiratory:  clear to auscultation bilaterally, normal work of breathing GI: soft, nontender, nondistended, + BS MS: no deformity or atrophy  Skin: warm and dry, device site well healed Neuro:  Strength and sensation are intact Psych: euthymic mood, full affect  EKG:  EKG is ordered today. Personal review of the ekg ordered shows sinus rhythm, rate 62  Personal review of the device interrogation today. Results in [place]: 11/09/2020: Magnesium 1.9; NT-Pro BNP 430 04/09/2021: ALT 35; BNP 100.5; BUN 12; Creatinine, Ser  0.82;  Hemoglobin 14.5; Platelets 161; Potassium 3.9; Sodium 139; TSH 1.230    Lipid Panel     Component Value Date/Time   CHOL 135 11/09/2020 1011   CHOL 159 03/27/2013 0818   TRIG 81 11/09/2020 1011   TRIG 127 03/27/2013 0818   HDL 46 11/09/2020 1011   HDL 45 03/27/2013 0818   CHOLHDL 2.9 11/09/2020 1011   CHOLHDL 3 04/18/2018 1023   VLDL 26.2 04/18/2018 1023   LDLCALC 73 11/09/2020 1011   LDLCALC 89 03/27/2013 0818   LDLDIRECT 87.6 09/19/2012 1206     Wt Readings from Last 3 Encounters:  06/18/21 235 lb 3.2 oz (106.7 kg)  06/14/21 236 lb (107 kg)  05/25/21 239 lb (108.4 kg)      Other studies Reviewed: Additional studies/ records that were reviewed today include: LHC 01/12/18  Review of the above records today demonstrates:  The left ventricular systolic function is normal. LV end diastolic pressure is normal. The left ventricular ejection fraction is 55-65% by visual estimate.     1. Minimal nonobstructive CAD 2. Normal LV function 3. Normal LVEDP  TTE 11/24/2020  1. Left ventricular ejection fraction, by estimation, is 35% with  hypokinesis of the inferoseptal, inferior , mid/distal anterior, distal  lateral and apical walls . The left ventricle has moderately decreased  function. There is moderate left ventricular   hypertrophy. Left ventricular diastolic parameters are consistent with  Grade I diastolic dysfunction (impaired relaxation). Elevated left atrial  pressure. Compared to echo report from 2017, LVEF is worse   2. Right ventricular systolic function is low normal. The right  ventricular size is normal.   3. The mitral valve is normal in structure. No evidence of mitral valve  regurgitation.   4. The aortic valve is normal in structure. Aortic valve regurgitation is  mild.   5. Aortic dilatation noted. There is mild dilatation of the aortic root,  measuring 41 mm.   Myoview 12/16/2020 The left ventricular ejection fraction is mildly decreased  (45-54%). Nuclear stress EF: 46%. There was no ST segment deviation noted during stress. No T wave inversion was noted during stress. The study is normal. This is an intermediate risk study due to reduced systolic function. There is no ischemia.  ASSESSMENT AND PLAN:  1.  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  Unfortunately he developed heart failure and is now status post Medtronic CRT P upgrade 03/10/2021.  Device functioning appropriately.  No changes at this time.  2.  Systolic heart failure: Likely due to RV pacing.  Is status post biventricular upgrade.  Currently on Aldactone 12.5 mg daily  3.  Hypertension: Currently well controlled  4.  Obstructive sleep apnea: CPAP compliance encouraged   Current medicines are reviewed at length with the patient today.   The patient does not have concerns regarding his medicines.  The following changes were made today: None  Labs/ tests ordered today include:  Orders Placed This Encounter  Procedures   EKG 12-Lead      Disposition:   FU with Alixandra Alfieri 9 months  Signed, Council Munguia Meredith Leeds, MD  06/18/2021 3:08 PM     [address] [place] [address] [phone] (office) [phone] (fax)

## 2021-06-18 NOTE — Patient Instructions (Signed)
Medication Instructions:  Your physician recommends that you continue on your current medications as directed. Please refer to the Current Medication list given to you today.  *If you need a refill on your cardiac medications before your next appointment, please call your pharmacy*   Lab Work: None ordered If you have labs (blood work) drawn today and your tests are completely normal, you will receive your results only by: [place] (if you have MyChart) OR A paper copy in the mail If you have any lab test that is abnormal or we need to change your treatment, we will call you to review the results.   Testing/Procedures: None ordered   Follow-Up: At [facility], you and your health needs are our priority.  As part of our continuing mission to provide you with exceptional heart care, we have created designated Provider Care Teams.  These Care Teams include your primary Cardiologist (physician) and Advanced Practice Providers (APPs -  Physician Assistants and Nurse Practitioners) who all work together to provide you with the care you need, when you need it.  We recommend signing up for the patient portal called "MyChart".  Sign up information is provided on this After Visit Summary.  MyChart is used to connect with patients for Virtual Visits (Telemedicine).  Patients are able to view lab/test results, encounter notes, upcoming appointments, etc.  Non-urgent messages can be sent to your provider as well.   To learn more about what you can do with MyChart, go to [email or web address].    Remote monitoring is used to monitor your Pacemaker or ICD from home. This monitoring reduces the number of office visits required to check your device to one time per year. It allows [place] to keep an eye on the functioning of your device to ensure it is working properly. You are scheduled for a device check from home on 08/27/2021. You may send your transmission at any time that day. If you have a  wireless device, the transmission will be sent automatically. After your physician reviews your transmission, you will receive a postcard with your next transmission date.  Your next appointment:   9 month(s)  The format for your next appointment:   In Person  Provider:      Thank you for choosing [facility]!!   Trinidad Curet, RN [phone]    Other Instructions

## 2021-06-21 DIAGNOSIS — R569 Unspecified convulsions: Secondary | ICD-10-CM | POA: Diagnosis not present

## 2021-06-21 DIAGNOSIS — I10 Essential (primary) hypertension: Secondary | ICD-10-CM | POA: Diagnosis not present

## 2021-06-21 DIAGNOSIS — L600 Ingrowing nail: Secondary | ICD-10-CM | POA: Diagnosis not present

## 2021-06-21 DIAGNOSIS — I671 Cerebral aneurysm, nonruptured: Secondary | ICD-10-CM | POA: Diagnosis not present

## 2021-06-21 DIAGNOSIS — R296 Repeated falls: Secondary | ICD-10-CM | POA: Diagnosis not present

## 2021-06-21 DIAGNOSIS — F01B0 Vascular dementia, moderate, without behavioral disturbance, psychotic disturbance, mood disturbance, and anxiety: Secondary | ICD-10-CM | POA: Diagnosis not present

## 2021-06-22 DIAGNOSIS — R413 Other amnesia: Secondary | ICD-10-CM | POA: Diagnosis not present

## 2021-06-22 DIAGNOSIS — R419 Unspecified symptoms and signs involving cognitive functions and awareness: Secondary | ICD-10-CM | POA: Diagnosis not present

## 2021-06-22 DIAGNOSIS — R279 Unspecified lack of coordination: Secondary | ICD-10-CM | POA: Diagnosis not present

## 2021-06-22 DIAGNOSIS — G3183 Dementia with Lewy bodies: Secondary | ICD-10-CM | POA: Diagnosis not present

## 2021-06-22 DIAGNOSIS — I63512 Cerebral infarction due to unspecified occlusion or stenosis of left middle cerebral artery: Secondary | ICD-10-CM | POA: Diagnosis not present

## 2021-06-22 DIAGNOSIS — R410 Disorientation, unspecified: Secondary | ICD-10-CM | POA: Diagnosis not present

## 2021-07-01 DIAGNOSIS — Z7409 Other reduced mobility: Secondary | ICD-10-CM | POA: Diagnosis not present

## 2021-07-01 DIAGNOSIS — F02A2 Dementia in other diseases classified elsewhere, mild, with psychotic disturbance: Secondary | ICD-10-CM | POA: Diagnosis not present

## 2021-07-01 DIAGNOSIS — Q6689 Other  specified congenital deformities of feet: Secondary | ICD-10-CM | POA: Diagnosis not present

## 2021-07-01 DIAGNOSIS — R29898 Other symptoms and signs involving the musculoskeletal system: Secondary | ICD-10-CM | POA: Diagnosis not present

## 2021-07-01 DIAGNOSIS — M79671 Pain in right foot: Secondary | ICD-10-CM | POA: Diagnosis not present

## 2021-07-01 DIAGNOSIS — G3183 Dementia with Lewy bodies: Secondary | ICD-10-CM | POA: Diagnosis not present

## 2021-07-01 DIAGNOSIS — B351 Tinea unguium: Secondary | ICD-10-CM | POA: Diagnosis not present

## 2021-07-01 DIAGNOSIS — R269 Unspecified abnormalities of gait and mobility: Secondary | ICD-10-CM | POA: Diagnosis not present

## 2021-07-01 DIAGNOSIS — M79672 Pain in left foot: Secondary | ICD-10-CM | POA: Diagnosis not present

## 2021-07-02 DIAGNOSIS — Z7409 Other reduced mobility: Secondary | ICD-10-CM | POA: Diagnosis not present

## 2021-07-02 DIAGNOSIS — R29898 Other symptoms and signs involving the musculoskeletal system: Secondary | ICD-10-CM | POA: Diagnosis not present

## 2021-07-02 DIAGNOSIS — R269 Unspecified abnormalities of gait and mobility: Secondary | ICD-10-CM | POA: Diagnosis not present

## 2021-07-02 DIAGNOSIS — R279 Unspecified lack of coordination: Secondary | ICD-10-CM | POA: Diagnosis not present

## 2021-07-02 DIAGNOSIS — I63512 Cerebral infarction due to unspecified occlusion or stenosis of left middle cerebral artery: Secondary | ICD-10-CM | POA: Diagnosis not present

## 2021-07-02 DIAGNOSIS — R410 Disorientation, unspecified: Secondary | ICD-10-CM | POA: Diagnosis not present

## 2021-07-02 DIAGNOSIS — F02A2 Dementia in other diseases classified elsewhere, mild, with psychotic disturbance: Secondary | ICD-10-CM | POA: Diagnosis not present

## 2021-07-02 DIAGNOSIS — G3183 Dementia with Lewy bodies: Secondary | ICD-10-CM | POA: Diagnosis not present

## 2021-07-02 DIAGNOSIS — R419 Unspecified symptoms and signs involving cognitive functions and awareness: Secondary | ICD-10-CM | POA: Diagnosis not present

## 2021-07-02 DIAGNOSIS — R413 Other amnesia: Secondary | ICD-10-CM | POA: Diagnosis not present

## 2021-07-08 DIAGNOSIS — R419 Unspecified symptoms and signs involving cognitive functions and awareness: Secondary | ICD-10-CM | POA: Diagnosis not present

## 2021-07-08 DIAGNOSIS — R4189 Other symptoms and signs involving cognitive functions and awareness: Secondary | ICD-10-CM | POA: Diagnosis not present

## 2021-07-08 DIAGNOSIS — Z7409 Other reduced mobility: Secondary | ICD-10-CM | POA: Diagnosis not present

## 2021-07-08 DIAGNOSIS — R413 Other amnesia: Secondary | ICD-10-CM | POA: Diagnosis not present

## 2021-07-08 DIAGNOSIS — Z789 Other specified health status: Secondary | ICD-10-CM | POA: Diagnosis not present

## 2021-07-08 DIAGNOSIS — G3183 Dementia with Lewy bodies: Secondary | ICD-10-CM | POA: Diagnosis not present

## 2021-07-08 DIAGNOSIS — Z8673 Personal history of transient ischemic attack (TIA), and cerebral infarction without residual deficits: Secondary | ICD-10-CM | POA: Diagnosis not present

## 2021-07-08 DIAGNOSIS — Z8679 Personal history of other diseases of the circulatory system: Secondary | ICD-10-CM | POA: Diagnosis not present

## 2021-07-08 DIAGNOSIS — R269 Unspecified abnormalities of gait and mobility: Secondary | ICD-10-CM | POA: Diagnosis not present

## 2021-07-08 DIAGNOSIS — R296 Repeated falls: Secondary | ICD-10-CM | POA: Diagnosis not present

## 2021-07-08 DIAGNOSIS — F02A2 Dementia in other diseases classified elsewhere, mild, with psychotic disturbance: Secondary | ICD-10-CM | POA: Diagnosis not present

## 2021-07-09 ENCOUNTER — Other Ambulatory Visit (HOSPITAL_COMMUNITY)
Admission: RE | Admit: 2021-07-09 | Discharge: 2021-07-09 | Disposition: A | Discharge status: Home / Self Care | Payer: Medicare Other | Source: Ambulatory Visit

## 2021-07-09 DIAGNOSIS — Z029 Encounter for administrative examinations, unspecified: Secondary | ICD-10-CM | POA: Diagnosis present

## 2021-07-09 DIAGNOSIS — H5310 Unspecified subjective visual disturbances: Secondary | ICD-10-CM | POA: Diagnosis not present

## 2021-07-09 LAB — CBC WITH DIFFERENTIAL/PLATELET
Abs Immature Granulocytes: 0.02 10*3/uL (ref 0.00–0.07)
Basophils Absolute: 0 10*3/uL (ref 0.0–0.1)
Basophils Relative: 0 %
Eosinophils Absolute: 0.1 10*3/uL (ref 0.0–0.5)
Eosinophils Relative: 2 %
HCT: 44.3 % (ref 39.0–52.0)
Hemoglobin: 14.6 g/dL (ref 13.0–17.0)
Immature Granulocytes: 0 %
Lymphocytes Relative: 21 %
Lymphs Abs: 1.5 10*3/uL (ref 0.7–4.0)
MCH: 30.7 pg (ref 26.0–34.0)
MCHC: 33 g/dL (ref 30.0–36.0)
MCV: 93.1 fL (ref 80.0–100.0)
Monocytes Absolute: 0.4 10*3/uL (ref 0.1–1.0)
Monocytes Relative: 6 %
Neutro Abs: 5 10*3/uL (ref 1.7–7.7)
Neutrophils Relative %: 71 %
Platelets: 165 10*3/uL (ref 150–400)
RBC: 4.76 MIL/uL (ref 4.22–5.81)
RDW: 12.8 % (ref 11.5–15.5)
WBC: 7.1 10*3/uL (ref 4.0–10.5)
nRBC: 0 % (ref 0.0–0.2)

## 2021-07-09 LAB — C-REACTIVE PROTEIN: CRP: 0.6 mg/dL (ref <1.0)

## 2021-07-09 LAB — SEDIMENTATION RATE: Sed Rate: 5 mm/hr (ref 0–16)

## 2021-07-12 ENCOUNTER — Other Ambulatory Visit (HOSPITAL_BASED_OUTPATIENT_CLINIC_OR_DEPARTMENT_OTHER): Payer: Self-pay

## 2021-07-12 DIAGNOSIS — I5020 Unspecified systolic (congestive) heart failure: Secondary | ICD-10-CM | POA: Diagnosis not present

## 2021-07-12 DIAGNOSIS — Z950 Presence of cardiac pacemaker: Secondary | ICD-10-CM | POA: Diagnosis not present

## 2021-07-12 DIAGNOSIS — S065XAA Traumatic subdural hemorrhage with loss of consciousness status unknown, initial encounter: Secondary | ICD-10-CM | POA: Diagnosis not present

## 2021-07-12 DIAGNOSIS — F05 Delirium due to known physiological condition: Secondary | ICD-10-CM | POA: Diagnosis not present

## 2021-07-12 DIAGNOSIS — W01198A Fall on same level from slipping, tripping and stumbling with subsequent striking against other object, initial encounter: Secondary | ICD-10-CM | POA: Diagnosis not present

## 2021-07-12 DIAGNOSIS — E785 Hyperlipidemia, unspecified: Secondary | ICD-10-CM | POA: Diagnosis not present

## 2021-07-12 DIAGNOSIS — N390 Urinary tract infection, site not specified: Secondary | ICD-10-CM | POA: Diagnosis not present

## 2021-07-12 DIAGNOSIS — M800AXA Age-related osteoporosis with current pathological fracture, other site, initial encounter for fracture: Secondary | ICD-10-CM | POA: Diagnosis present

## 2021-07-12 DIAGNOSIS — Z961 Presence of intraocular lens: Secondary | ICD-10-CM | POA: Diagnosis present

## 2021-07-12 DIAGNOSIS — Z515 Encounter for palliative care: Secondary | ICD-10-CM | POA: Diagnosis not present

## 2021-07-12 DIAGNOSIS — E875 Hyperkalemia: Secondary | ICD-10-CM | POA: Diagnosis not present

## 2021-07-12 DIAGNOSIS — W010XXA Fall on same level from slipping, tripping and stumbling without subsequent striking against object, initial encounter: Secondary | ICD-10-CM | POA: Diagnosis not present

## 2021-07-12 DIAGNOSIS — Z981 Arthrodesis status: Secondary | ICD-10-CM | POA: Diagnosis not present

## 2021-07-12 DIAGNOSIS — Y939 Activity, unspecified: Secondary | ICD-10-CM | POA: Diagnosis not present

## 2021-07-12 DIAGNOSIS — M47812 Spondylosis without myelopathy or radiculopathy, cervical region: Secondary | ICD-10-CM | POA: Diagnosis not present

## 2021-07-12 DIAGNOSIS — R633 Feeding difficulties, unspecified: Secondary | ICD-10-CM | POA: Diagnosis not present

## 2021-07-12 DIAGNOSIS — R918 Other nonspecific abnormal finding of lung field: Secondary | ICD-10-CM | POA: Diagnosis not present

## 2021-07-12 DIAGNOSIS — Z978 Presence of other specified devices: Secondary | ICD-10-CM | POA: Diagnosis not present

## 2021-07-12 DIAGNOSIS — Z789 Other specified health status: Secondary | ICD-10-CM | POA: Diagnosis not present

## 2021-07-12 DIAGNOSIS — S72001D Fracture of unspecified part of neck of right femur, subsequent encounter for closed fracture with routine healing: Secondary | ICD-10-CM | POA: Diagnosis not present

## 2021-07-12 DIAGNOSIS — Y9301 Activity, walking, marching and hiking: Secondary | ICD-10-CM | POA: Diagnosis not present

## 2021-07-12 DIAGNOSIS — R4189 Other symptoms and signs involving cognitive functions and awareness: Secondary | ICD-10-CM | POA: Diagnosis not present

## 2021-07-12 DIAGNOSIS — Z982 Presence of cerebrospinal fluid drainage device: Secondary | ICD-10-CM | POA: Diagnosis not present

## 2021-07-12 DIAGNOSIS — R569 Unspecified convulsions: Secondary | ICD-10-CM | POA: Diagnosis not present

## 2021-07-12 DIAGNOSIS — J690 Pneumonitis due to inhalation of food and vomit: Secondary | ICD-10-CM | POA: Diagnosis not present

## 2021-07-12 DIAGNOSIS — Z7901 Long term (current) use of anticoagulants: Secondary | ICD-10-CM | POA: Diagnosis not present

## 2021-07-12 DIAGNOSIS — S2241XD Multiple fractures of ribs, right side, subsequent encounter for fracture with routine healing: Secondary | ICD-10-CM | POA: Diagnosis not present

## 2021-07-12 DIAGNOSIS — H903 Sensorineural hearing loss, bilateral: Secondary | ICD-10-CM | POA: Diagnosis present

## 2021-07-12 DIAGNOSIS — Z9189 Other specified personal risk factors, not elsewhere classified: Secondary | ICD-10-CM | POA: Diagnosis not present

## 2021-07-12 DIAGNOSIS — I495 Sick sinus syndrome: Secondary | ICD-10-CM | POA: Diagnosis present

## 2021-07-12 DIAGNOSIS — S32029D Unspecified fracture of second lumbar vertebra, subsequent encounter for fracture with routine healing: Secondary | ICD-10-CM | POA: Diagnosis not present

## 2021-07-12 DIAGNOSIS — I498 Other specified cardiac arrhythmias: Secondary | ICD-10-CM | POA: Diagnosis not present

## 2021-07-12 DIAGNOSIS — Z7982 Long term (current) use of aspirin: Secondary | ICD-10-CM | POA: Diagnosis not present

## 2021-07-12 DIAGNOSIS — F0282 Dementia in other diseases classified elsewhere, unspecified severity, with psychotic disturbance: Secondary | ICD-10-CM | POA: Diagnosis not present

## 2021-07-12 DIAGNOSIS — I5032 Chronic diastolic (congestive) heart failure: Secondary | ICD-10-CM | POA: Diagnosis present

## 2021-07-12 DIAGNOSIS — S32019D Unspecified fracture of first lumbar vertebra, subsequent encounter for fracture with routine healing: Secondary | ICD-10-CM | POA: Diagnosis not present

## 2021-07-12 DIAGNOSIS — I110 Hypertensive heart disease with heart failure: Secondary | ICD-10-CM | POA: Diagnosis not present

## 2021-07-12 DIAGNOSIS — M5033 Other cervical disc degeneration, cervicothoracic region: Secondary | ICD-10-CM | POA: Diagnosis not present

## 2021-07-12 DIAGNOSIS — S72002A Fracture of unspecified part of neck of left femur, initial encounter for closed fracture: Secondary | ICD-10-CM | POA: Diagnosis not present

## 2021-07-12 DIAGNOSIS — I10 Essential (primary) hypertension: Secondary | ICD-10-CM | POA: Diagnosis not present

## 2021-07-12 DIAGNOSIS — R54 Age-related physical debility: Secondary | ICD-10-CM | POA: Diagnosis not present

## 2021-07-12 DIAGNOSIS — Z20822 Contact with and (suspected) exposure to covid-19: Secondary | ICD-10-CM | POA: Diagnosis not present

## 2021-07-12 DIAGNOSIS — G4733 Obstructive sleep apnea (adult) (pediatric): Secondary | ICD-10-CM | POA: Diagnosis not present

## 2021-07-12 DIAGNOSIS — G4700 Insomnia, unspecified: Secondary | ICD-10-CM | POA: Diagnosis present

## 2021-07-12 DIAGNOSIS — Z8679 Personal history of other diseases of the circulatory system: Secondary | ICD-10-CM | POA: Diagnosis not present

## 2021-07-12 DIAGNOSIS — S7291XA Unspecified fracture of right femur, initial encounter for closed fracture: Secondary | ICD-10-CM | POA: Diagnosis not present

## 2021-07-12 DIAGNOSIS — M5134 Other intervertebral disc degeneration, thoracic region: Secondary | ICD-10-CM | POA: Diagnosis not present

## 2021-07-12 DIAGNOSIS — S99911A Unspecified injury of right ankle, initial encounter: Secondary | ICD-10-CM | POA: Diagnosis not present

## 2021-07-12 DIAGNOSIS — D72829 Elevated white blood cell count, unspecified: Secondary | ICD-10-CM | POA: Diagnosis not present

## 2021-07-12 DIAGNOSIS — M5136 Other intervertebral disc degeneration, lumbar region: Secondary | ICD-10-CM | POA: Diagnosis not present

## 2021-07-12 DIAGNOSIS — Z9981 Dependence on supplemental oxygen: Secondary | ICD-10-CM | POA: Diagnosis not present

## 2021-07-12 DIAGNOSIS — R4182 Altered mental status, unspecified: Secondary | ICD-10-CM | POA: Diagnosis not present

## 2021-07-12 DIAGNOSIS — Z96641 Presence of right artificial hip joint: Secondary | ICD-10-CM | POA: Diagnosis not present

## 2021-07-12 DIAGNOSIS — S065X9A Traumatic subdural hemorrhage with loss of consciousness of unspecified duration, initial encounter: Secondary | ICD-10-CM | POA: Diagnosis not present

## 2021-07-12 DIAGNOSIS — S199XXA Unspecified injury of neck, initial encounter: Secondary | ICD-10-CM | POA: Diagnosis not present

## 2021-07-12 DIAGNOSIS — M8580 Other specified disorders of bone density and structure, unspecified site: Secondary | ICD-10-CM | POA: Diagnosis not present

## 2021-07-12 DIAGNOSIS — W19XXXD Unspecified fall, subsequent encounter: Secondary | ICD-10-CM | POA: Diagnosis not present

## 2021-07-12 DIAGNOSIS — D62 Acute posthemorrhagic anemia: Secondary | ICD-10-CM | POA: Diagnosis not present

## 2021-07-12 DIAGNOSIS — G40209 Localization-related (focal) (partial) symptomatic epilepsy and epileptic syndromes with complex partial seizures, not intractable, without status epilepticus: Secondary | ICD-10-CM | POA: Diagnosis present

## 2021-07-12 DIAGNOSIS — R55 Syncope and collapse: Secondary | ICD-10-CM | POA: Diagnosis not present

## 2021-07-12 DIAGNOSIS — S2231XA Fracture of one rib, right side, initial encounter for closed fracture: Secondary | ICD-10-CM | POA: Diagnosis not present

## 2021-07-12 DIAGNOSIS — F02818 Dementia in other diseases classified elsewhere, unspecified severity, with other behavioral disturbance: Secondary | ICD-10-CM | POA: Diagnosis not present

## 2021-07-12 DIAGNOSIS — Z9989 Dependence on other enabling machines and devices: Secondary | ICD-10-CM | POA: Diagnosis not present

## 2021-07-12 DIAGNOSIS — S79929A Unspecified injury of unspecified thigh, initial encounter: Secondary | ICD-10-CM | POA: Diagnosis not present

## 2021-07-12 DIAGNOSIS — S065X0A Traumatic subdural hemorrhage without loss of consciousness, initial encounter: Secondary | ICD-10-CM | POA: Diagnosis not present

## 2021-07-12 DIAGNOSIS — M4692 Unspecified inflammatory spondylopathy, cervical region: Secondary | ICD-10-CM | POA: Diagnosis not present

## 2021-07-12 DIAGNOSIS — Y998 Other external cause status: Secondary | ICD-10-CM | POA: Diagnosis not present

## 2021-07-12 DIAGNOSIS — M25551 Pain in right hip: Secondary | ICD-10-CM | POA: Diagnosis not present

## 2021-07-12 DIAGNOSIS — S79911A Unspecified injury of right hip, initial encounter: Secondary | ICD-10-CM | POA: Diagnosis present

## 2021-07-12 DIAGNOSIS — S0083XA Contusion of other part of head, initial encounter: Secondary | ICD-10-CM | POA: Diagnosis not present

## 2021-07-12 DIAGNOSIS — N400 Enlarged prostate without lower urinary tract symptoms: Secondary | ICD-10-CM | POA: Diagnosis not present

## 2021-07-12 DIAGNOSIS — M898X8 Other specified disorders of bone, other site: Secondary | ICD-10-CM | POA: Diagnosis not present

## 2021-07-12 DIAGNOSIS — F0280 Dementia in other diseases classified elsewhere without behavioral disturbance: Secondary | ICD-10-CM | POA: Diagnosis not present

## 2021-07-12 DIAGNOSIS — G3183 Dementia with Lewy bodies: Secondary | ICD-10-CM | POA: Diagnosis present

## 2021-07-12 DIAGNOSIS — R9431 Abnormal electrocardiogram [ECG] [EKG]: Secondary | ICD-10-CM | POA: Diagnosis not present

## 2021-07-12 DIAGNOSIS — W19XXXA Unspecified fall, initial encounter: Secondary | ICD-10-CM | POA: Diagnosis not present

## 2021-07-12 DIAGNOSIS — J309 Allergic rhinitis, unspecified: Secondary | ICD-10-CM | POA: Diagnosis not present

## 2021-07-12 DIAGNOSIS — F0781 Postconcussional syndrome: Secondary | ICD-10-CM | POA: Diagnosis not present

## 2021-07-12 DIAGNOSIS — R32 Unspecified urinary incontinence: Secondary | ICD-10-CM | POA: Diagnosis not present

## 2021-07-12 DIAGNOSIS — G9340 Encephalopathy, unspecified: Secondary | ICD-10-CM | POA: Diagnosis not present

## 2021-07-12 DIAGNOSIS — Z043 Encounter for examination and observation following other accident: Secondary | ICD-10-CM | POA: Diagnosis not present

## 2021-07-12 DIAGNOSIS — S72031A Displaced midcervical fracture of right femur, initial encounter for closed fracture: Secondary | ICD-10-CM | POA: Diagnosis not present

## 2021-07-12 DIAGNOSIS — F02811 Dementia in other diseases classified elsewhere, unspecified severity, with agitation: Secondary | ICD-10-CM | POA: Diagnosis not present

## 2021-07-12 DIAGNOSIS — K5900 Constipation, unspecified: Secondary | ICD-10-CM | POA: Diagnosis not present

## 2021-07-12 DIAGNOSIS — S72001A Fracture of unspecified part of neck of right femur, initial encounter for closed fracture: Secondary | ICD-10-CM | POA: Diagnosis not present

## 2021-07-12 DIAGNOSIS — F02B2 Dementia in other diseases classified elsewhere, moderate, with psychotic disturbance: Secondary | ICD-10-CM | POA: Diagnosis not present

## 2021-07-12 DIAGNOSIS — G40909 Epilepsy, unspecified, not intractable, without status epilepticus: Secondary | ICD-10-CM | POA: Diagnosis not present

## 2021-07-12 DIAGNOSIS — G3184 Mild cognitive impairment, so stated: Secondary | ICD-10-CM | POA: Diagnosis not present

## 2021-07-12 DIAGNOSIS — Z781 Physical restraint status: Secondary | ICD-10-CM | POA: Diagnosis not present

## 2021-07-12 DIAGNOSIS — Z743 Need for continuous supervision: Secondary | ICD-10-CM | POA: Diagnosis not present

## 2021-07-12 DIAGNOSIS — B9620 Unspecified Escherichia coli [E. coli] as the cause of diseases classified elsewhere: Secondary | ICD-10-CM | POA: Diagnosis not present

## 2021-07-12 DIAGNOSIS — I6200 Nontraumatic subdural hemorrhage, unspecified: Secondary | ICD-10-CM | POA: Diagnosis not present

## 2021-07-12 DIAGNOSIS — R0689 Other abnormalities of breathing: Secondary | ICD-10-CM | POA: Diagnosis not present

## 2021-07-12 DIAGNOSIS — E559 Vitamin D deficiency, unspecified: Secondary | ICD-10-CM | POA: Diagnosis not present

## 2021-07-12 DIAGNOSIS — F0150 Vascular dementia without behavioral disturbance: Secondary | ICD-10-CM | POA: Diagnosis not present

## 2021-07-12 DIAGNOSIS — G9389 Other specified disorders of brain: Secondary | ICD-10-CM | POA: Diagnosis not present

## 2021-07-12 DIAGNOSIS — Z7409 Other reduced mobility: Secondary | ICD-10-CM | POA: Diagnosis not present

## 2021-07-12 DIAGNOSIS — R1313 Dysphagia, pharyngeal phase: Secondary | ICD-10-CM | POA: Diagnosis not present

## 2021-07-12 DIAGNOSIS — S72041A Displaced fracture of base of neck of right femur, initial encounter for closed fracture: Secondary | ICD-10-CM | POA: Diagnosis not present

## 2021-07-12 DIAGNOSIS — R2689 Other abnormalities of gait and mobility: Secondary | ICD-10-CM | POA: Diagnosis not present

## 2021-07-12 DIAGNOSIS — I517 Cardiomegaly: Secondary | ICD-10-CM | POA: Diagnosis not present

## 2021-07-12 DIAGNOSIS — F01518 Vascular dementia, unspecified severity, with other behavioral disturbance: Secondary | ICD-10-CM | POA: Diagnosis present

## 2021-07-12 DIAGNOSIS — R451 Restlessness and agitation: Secondary | ICD-10-CM | POA: Diagnosis not present

## 2021-07-12 DIAGNOSIS — S0993XA Unspecified injury of face, initial encounter: Secondary | ICD-10-CM | POA: Diagnosis not present

## 2021-07-12 DIAGNOSIS — M80051A Age-related osteoporosis with current pathological fracture, right femur, initial encounter for fracture: Secondary | ICD-10-CM | POA: Diagnosis present

## 2021-07-12 DIAGNOSIS — I5042 Chronic combined systolic (congestive) and diastolic (congestive) heart failure: Secondary | ICD-10-CM | POA: Diagnosis not present

## 2021-07-14 ENCOUNTER — Other Ambulatory Visit: Payer: Self-pay

## 2021-07-14 ENCOUNTER — Emergency Department (HOSPITAL_BASED_OUTPATIENT_CLINIC_OR_DEPARTMENT_OTHER): Payer: Medicare Other

## 2021-07-14 ENCOUNTER — Encounter (HOSPITAL_BASED_OUTPATIENT_CLINIC_OR_DEPARTMENT_OTHER): Payer: Self-pay

## 2021-07-14 ENCOUNTER — Emergency Department (HOSPITAL_BASED_OUTPATIENT_CLINIC_OR_DEPARTMENT_OTHER)
Admission: EM | Admit: 2021-07-14 | Discharge: 2021-07-14 | Disposition: A | Discharge status: Other Acute Inpatient Hospital | Payer: Medicare Other | Attending: Emergency Medicine | Admitting: Emergency Medicine

## 2021-07-14 DIAGNOSIS — G40209 Localization-related (focal) (partial) symptomatic epilepsy and epileptic syndromes with complex partial seizures, not intractable, without status epilepticus: Secondary | ICD-10-CM | POA: Diagnosis present

## 2021-07-14 DIAGNOSIS — F0280 Dementia in other diseases classified elsewhere without behavioral disturbance: Secondary | ICD-10-CM | POA: Diagnosis not present

## 2021-07-14 DIAGNOSIS — M5033 Other cervical disc degeneration, cervicothoracic region: Secondary | ICD-10-CM | POA: Diagnosis not present

## 2021-07-14 DIAGNOSIS — Z7982 Long term (current) use of aspirin: Secondary | ICD-10-CM | POA: Diagnosis not present

## 2021-07-14 DIAGNOSIS — R9431 Abnormal electrocardiogram [ECG] [EKG]: Secondary | ICD-10-CM | POA: Diagnosis not present

## 2021-07-14 DIAGNOSIS — S7291XA Unspecified fracture of right femur, initial encounter for closed fracture: Secondary | ICD-10-CM | POA: Diagnosis not present

## 2021-07-14 DIAGNOSIS — G9340 Encephalopathy, unspecified: Secondary | ICD-10-CM | POA: Diagnosis not present

## 2021-07-14 DIAGNOSIS — W19XXXD Unspecified fall, subsequent encounter: Secondary | ICD-10-CM | POA: Diagnosis not present

## 2021-07-14 DIAGNOSIS — Z981 Arthrodesis status: Secondary | ICD-10-CM | POA: Diagnosis not present

## 2021-07-14 DIAGNOSIS — I6200 Nontraumatic subdural hemorrhage, unspecified: Secondary | ICD-10-CM | POA: Diagnosis not present

## 2021-07-14 DIAGNOSIS — G3183 Dementia with Lewy bodies: Secondary | ICD-10-CM | POA: Diagnosis present

## 2021-07-14 DIAGNOSIS — W01198A Fall on same level from slipping, tripping and stumbling with subsequent striking against other object, initial encounter: Secondary | ICD-10-CM | POA: Diagnosis not present

## 2021-07-14 DIAGNOSIS — F0150 Vascular dementia without behavioral disturbance: Secondary | ICD-10-CM | POA: Diagnosis not present

## 2021-07-14 DIAGNOSIS — H903 Sensorineural hearing loss, bilateral: Secondary | ICD-10-CM | POA: Diagnosis present

## 2021-07-14 DIAGNOSIS — Z20822 Contact with and (suspected) exposure to covid-19: Secondary | ICD-10-CM | POA: Diagnosis not present

## 2021-07-14 DIAGNOSIS — W19XXXA Unspecified fall, initial encounter: Secondary | ICD-10-CM | POA: Diagnosis not present

## 2021-07-14 DIAGNOSIS — Z043 Encounter for examination and observation following other accident: Secondary | ICD-10-CM | POA: Diagnosis not present

## 2021-07-14 DIAGNOSIS — I5020 Unspecified systolic (congestive) heart failure: Secondary | ICD-10-CM | POA: Diagnosis not present

## 2021-07-14 DIAGNOSIS — M4692 Unspecified inflammatory spondylopathy, cervical region: Secondary | ICD-10-CM | POA: Diagnosis not present

## 2021-07-14 DIAGNOSIS — I5032 Chronic diastolic (congestive) heart failure: Secondary | ICD-10-CM | POA: Diagnosis present

## 2021-07-14 DIAGNOSIS — S79929A Unspecified injury of unspecified thigh, initial encounter: Secondary | ICD-10-CM | POA: Diagnosis not present

## 2021-07-14 DIAGNOSIS — R54 Age-related physical debility: Secondary | ICD-10-CM | POA: Diagnosis present

## 2021-07-14 DIAGNOSIS — I10 Essential (primary) hypertension: Secondary | ICD-10-CM | POA: Diagnosis not present

## 2021-07-14 DIAGNOSIS — Z950 Presence of cardiac pacemaker: Secondary | ICD-10-CM | POA: Diagnosis not present

## 2021-07-14 DIAGNOSIS — F0282 Dementia in other diseases classified elsewhere, unspecified severity, with psychotic disturbance: Secondary | ICD-10-CM | POA: Diagnosis not present

## 2021-07-14 DIAGNOSIS — G4733 Obstructive sleep apnea (adult) (pediatric): Secondary | ICD-10-CM | POA: Diagnosis present

## 2021-07-14 DIAGNOSIS — I517 Cardiomegaly: Secondary | ICD-10-CM | POA: Diagnosis not present

## 2021-07-14 DIAGNOSIS — R1313 Dysphagia, pharyngeal phase: Secondary | ICD-10-CM | POA: Diagnosis present

## 2021-07-14 DIAGNOSIS — I5042 Chronic combined systolic (congestive) and diastolic (congestive) heart failure: Secondary | ICD-10-CM | POA: Diagnosis not present

## 2021-07-14 DIAGNOSIS — F01518 Vascular dementia, unspecified severity, with other behavioral disturbance: Secondary | ICD-10-CM | POA: Diagnosis present

## 2021-07-14 DIAGNOSIS — Z9989 Dependence on other enabling machines and devices: Secondary | ICD-10-CM | POA: Diagnosis not present

## 2021-07-14 DIAGNOSIS — S32029D Unspecified fracture of second lumbar vertebra, subsequent encounter for fracture with routine healing: Secondary | ICD-10-CM | POA: Diagnosis not present

## 2021-07-14 DIAGNOSIS — S065X9A Traumatic subdural hemorrhage with loss of consciousness of unspecified duration, initial encounter: Secondary | ICD-10-CM | POA: Diagnosis not present

## 2021-07-14 DIAGNOSIS — E875 Hyperkalemia: Secondary | ICD-10-CM | POA: Diagnosis not present

## 2021-07-14 DIAGNOSIS — R2689 Other abnormalities of gait and mobility: Secondary | ICD-10-CM | POA: Diagnosis not present

## 2021-07-14 DIAGNOSIS — G40909 Epilepsy, unspecified, not intractable, without status epilepticus: Secondary | ICD-10-CM | POA: Diagnosis not present

## 2021-07-14 DIAGNOSIS — F02B2 Dementia in other diseases classified elsewhere, moderate, with psychotic disturbance: Secondary | ICD-10-CM | POA: Diagnosis not present

## 2021-07-14 DIAGNOSIS — S72031A Displaced midcervical fracture of right femur, initial encounter for closed fracture: Secondary | ICD-10-CM | POA: Diagnosis not present

## 2021-07-14 DIAGNOSIS — D62 Acute posthemorrhagic anemia: Secondary | ICD-10-CM | POA: Diagnosis not present

## 2021-07-14 DIAGNOSIS — M5136 Other intervertebral disc degeneration, lumbar region: Secondary | ICD-10-CM | POA: Diagnosis not present

## 2021-07-14 DIAGNOSIS — Z7901 Long term (current) use of anticoagulants: Secondary | ICD-10-CM | POA: Diagnosis not present

## 2021-07-14 DIAGNOSIS — I498 Other specified cardiac arrhythmias: Secondary | ICD-10-CM | POA: Diagnosis not present

## 2021-07-14 DIAGNOSIS — N390 Urinary tract infection, site not specified: Secondary | ICD-10-CM | POA: Diagnosis not present

## 2021-07-14 DIAGNOSIS — I110 Hypertensive heart disease with heart failure: Secondary | ICD-10-CM | POA: Diagnosis present

## 2021-07-14 DIAGNOSIS — S2231XA Fracture of one rib, right side, initial encounter for closed fracture: Secondary | ICD-10-CM | POA: Diagnosis not present

## 2021-07-14 DIAGNOSIS — M5134 Other intervertebral disc degeneration, thoracic region: Secondary | ICD-10-CM | POA: Diagnosis not present

## 2021-07-14 DIAGNOSIS — F0781 Postconcussional syndrome: Secondary | ICD-10-CM | POA: Diagnosis not present

## 2021-07-14 DIAGNOSIS — R633 Feeding difficulties, unspecified: Secondary | ICD-10-CM | POA: Diagnosis not present

## 2021-07-14 DIAGNOSIS — Y9301 Activity, walking, marching and hiking: Secondary | ICD-10-CM | POA: Insufficient documentation

## 2021-07-14 DIAGNOSIS — Z781 Physical restraint status: Secondary | ICD-10-CM | POA: Diagnosis not present

## 2021-07-14 DIAGNOSIS — Z8679 Personal history of other diseases of the circulatory system: Secondary | ICD-10-CM | POA: Diagnosis not present

## 2021-07-14 DIAGNOSIS — Z982 Presence of cerebrospinal fluid drainage device: Secondary | ICD-10-CM | POA: Diagnosis not present

## 2021-07-14 DIAGNOSIS — M47812 Spondylosis without myelopathy or radiculopathy, cervical region: Secondary | ICD-10-CM | POA: Diagnosis not present

## 2021-07-14 DIAGNOSIS — B9620 Unspecified Escherichia coli [E. coli] as the cause of diseases classified elsewhere: Secondary | ICD-10-CM | POA: Diagnosis not present

## 2021-07-14 DIAGNOSIS — E559 Vitamin D deficiency, unspecified: Secondary | ICD-10-CM | POA: Diagnosis not present

## 2021-07-14 DIAGNOSIS — M8580 Other specified disorders of bone density and structure, unspecified site: Secondary | ICD-10-CM | POA: Diagnosis not present

## 2021-07-14 DIAGNOSIS — M898X8 Other specified disorders of bone, other site: Secondary | ICD-10-CM | POA: Diagnosis not present

## 2021-07-14 DIAGNOSIS — S065XAA Traumatic subdural hemorrhage with loss of consciousness status unknown, initial encounter: Secondary | ICD-10-CM | POA: Diagnosis not present

## 2021-07-14 DIAGNOSIS — Z789 Other specified health status: Secondary | ICD-10-CM | POA: Diagnosis not present

## 2021-07-14 DIAGNOSIS — S72001A Fracture of unspecified part of neck of right femur, initial encounter for closed fracture: Secondary | ICD-10-CM

## 2021-07-14 DIAGNOSIS — Z96641 Presence of right artificial hip joint: Secondary | ICD-10-CM | POA: Diagnosis not present

## 2021-07-14 DIAGNOSIS — I495 Sick sinus syndrome: Secondary | ICD-10-CM | POA: Diagnosis present

## 2021-07-14 DIAGNOSIS — F05 Delirium due to known physiological condition: Secondary | ICD-10-CM | POA: Diagnosis not present

## 2021-07-14 DIAGNOSIS — S065X0A Traumatic subdural hemorrhage without loss of consciousness, initial encounter: Secondary | ICD-10-CM | POA: Diagnosis not present

## 2021-07-14 DIAGNOSIS — S199XXA Unspecified injury of neck, initial encounter: Secondary | ICD-10-CM | POA: Diagnosis not present

## 2021-07-14 DIAGNOSIS — J690 Pneumonitis due to inhalation of food and vomit: Secondary | ICD-10-CM | POA: Diagnosis not present

## 2021-07-14 DIAGNOSIS — Y998 Other external cause status: Secondary | ICD-10-CM | POA: Diagnosis not present

## 2021-07-14 DIAGNOSIS — R569 Unspecified convulsions: Secondary | ICD-10-CM | POA: Diagnosis not present

## 2021-07-14 DIAGNOSIS — Z961 Presence of intraocular lens: Secondary | ICD-10-CM | POA: Diagnosis present

## 2021-07-14 DIAGNOSIS — K5900 Constipation, unspecified: Secondary | ICD-10-CM | POA: Diagnosis not present

## 2021-07-14 DIAGNOSIS — Z743 Need for continuous supervision: Secondary | ICD-10-CM | POA: Diagnosis not present

## 2021-07-14 DIAGNOSIS — G9389 Other specified disorders of brain: Secondary | ICD-10-CM | POA: Diagnosis not present

## 2021-07-14 DIAGNOSIS — S0993XA Unspecified injury of face, initial encounter: Secondary | ICD-10-CM | POA: Diagnosis not present

## 2021-07-14 DIAGNOSIS — R451 Restlessness and agitation: Secondary | ICD-10-CM | POA: Diagnosis not present

## 2021-07-14 DIAGNOSIS — R4189 Other symptoms and signs involving cognitive functions and awareness: Secondary | ICD-10-CM | POA: Diagnosis not present

## 2021-07-14 DIAGNOSIS — W010XXA Fall on same level from slipping, tripping and stumbling without subsequent striking against object, initial encounter: Secondary | ICD-10-CM | POA: Diagnosis not present

## 2021-07-14 DIAGNOSIS — S99911A Unspecified injury of right ankle, initial encounter: Secondary | ICD-10-CM | POA: Diagnosis not present

## 2021-07-14 DIAGNOSIS — S72001D Fracture of unspecified part of neck of right femur, subsequent encounter for closed fracture with routine healing: Secondary | ICD-10-CM | POA: Diagnosis not present

## 2021-07-14 DIAGNOSIS — S72041A Displaced fracture of base of neck of right femur, initial encounter for closed fracture: Secondary | ICD-10-CM | POA: Insufficient documentation

## 2021-07-14 DIAGNOSIS — R55 Syncope and collapse: Secondary | ICD-10-CM | POA: Diagnosis not present

## 2021-07-14 DIAGNOSIS — R0689 Other abnormalities of breathing: Secondary | ICD-10-CM | POA: Diagnosis not present

## 2021-07-14 DIAGNOSIS — M800AXA Age-related osteoporosis with current pathological fracture, other site, initial encounter for fracture: Secondary | ICD-10-CM | POA: Diagnosis present

## 2021-07-14 DIAGNOSIS — M80051A Age-related osteoporosis with current pathological fracture, right femur, initial encounter for fracture: Secondary | ICD-10-CM | POA: Diagnosis present

## 2021-07-14 DIAGNOSIS — R918 Other nonspecific abnormal finding of lung field: Secondary | ICD-10-CM | POA: Diagnosis not present

## 2021-07-14 DIAGNOSIS — R32 Unspecified urinary incontinence: Secondary | ICD-10-CM | POA: Diagnosis not present

## 2021-07-14 DIAGNOSIS — M25551 Pain in right hip: Secondary | ICD-10-CM | POA: Diagnosis not present

## 2021-07-14 DIAGNOSIS — S2241XD Multiple fractures of ribs, right side, subsequent encounter for fracture with routine healing: Secondary | ICD-10-CM | POA: Diagnosis not present

## 2021-07-14 DIAGNOSIS — D72829 Elevated white blood cell count, unspecified: Secondary | ICD-10-CM | POA: Diagnosis not present

## 2021-07-14 DIAGNOSIS — G3184 Mild cognitive impairment, so stated: Secondary | ICD-10-CM | POA: Diagnosis not present

## 2021-07-14 DIAGNOSIS — E785 Hyperlipidemia, unspecified: Secondary | ICD-10-CM | POA: Diagnosis present

## 2021-07-14 DIAGNOSIS — R4182 Altered mental status, unspecified: Secondary | ICD-10-CM | POA: Diagnosis not present

## 2021-07-14 DIAGNOSIS — N400 Enlarged prostate without lower urinary tract symptoms: Secondary | ICD-10-CM | POA: Diagnosis not present

## 2021-07-14 DIAGNOSIS — F02818 Dementia in other diseases classified elsewhere, unspecified severity, with other behavioral disturbance: Secondary | ICD-10-CM | POA: Diagnosis present

## 2021-07-14 DIAGNOSIS — F02811 Dementia in other diseases classified elsewhere, unspecified severity, with agitation: Secondary | ICD-10-CM | POA: Diagnosis not present

## 2021-07-14 DIAGNOSIS — Z978 Presence of other specified devices: Secondary | ICD-10-CM | POA: Diagnosis not present

## 2021-07-14 DIAGNOSIS — G4700 Insomnia, unspecified: Secondary | ICD-10-CM | POA: Diagnosis present

## 2021-07-14 DIAGNOSIS — Z9189 Other specified personal risk factors, not elsewhere classified: Secondary | ICD-10-CM | POA: Diagnosis not present

## 2021-07-14 DIAGNOSIS — S0083XA Contusion of other part of head, initial encounter: Secondary | ICD-10-CM | POA: Diagnosis not present

## 2021-07-14 DIAGNOSIS — Y939 Activity, unspecified: Secondary | ICD-10-CM | POA: Diagnosis not present

## 2021-07-14 DIAGNOSIS — Z7409 Other reduced mobility: Secondary | ICD-10-CM | POA: Diagnosis not present

## 2021-07-14 DIAGNOSIS — J309 Allergic rhinitis, unspecified: Secondary | ICD-10-CM | POA: Diagnosis not present

## 2021-07-14 DIAGNOSIS — Z9981 Dependence on supplemental oxygen: Secondary | ICD-10-CM | POA: Diagnosis not present

## 2021-07-14 DIAGNOSIS — S32019D Unspecified fracture of first lumbar vertebra, subsequent encounter for fracture with routine healing: Secondary | ICD-10-CM | POA: Diagnosis not present

## 2021-07-14 DIAGNOSIS — Z515 Encounter for palliative care: Secondary | ICD-10-CM | POA: Diagnosis not present

## 2021-07-14 DIAGNOSIS — S72002A Fracture of unspecified part of neck of left femur, initial encounter for closed fracture: Secondary | ICD-10-CM | POA: Diagnosis not present

## 2021-07-14 DIAGNOSIS — S79911A Unspecified injury of right hip, initial encounter: Secondary | ICD-10-CM | POA: Diagnosis present

## 2021-07-14 LAB — RESP PANEL BY RT-PCR (FLU A&B, COVID) ARPGX2
Influenza A by PCR: NEGATIVE
Influenza B by PCR: NEGATIVE
SARS Coronavirus 2 by RT PCR: NEGATIVE

## 2021-07-14 LAB — CBC WITH DIFFERENTIAL/PLATELET
Abs Immature Granulocytes: 0.07 10*3/uL (ref 0.00–0.07)
Basophils Absolute: 0 10*3/uL (ref 0.0–0.1)
Basophils Relative: 0 %
Eosinophils Absolute: 0 10*3/uL (ref 0.0–0.5)
Eosinophils Relative: 0 %
HCT: 44.1 % (ref 39.0–52.0)
Hemoglobin: 15.1 g/dL (ref 13.0–17.0)
Immature Granulocytes: 1 %
Lymphocytes Relative: 11 %
Lymphs Abs: 1.1 10*3/uL (ref 0.7–4.0)
MCH: 31.3 pg (ref 26.0–34.0)
MCHC: 34.2 g/dL (ref 30.0–36.0)
MCV: 91.3 fL (ref 80.0–100.0)
Monocytes Absolute: 0.5 10*3/uL (ref 0.1–1.0)
Monocytes Relative: 5 %
Neutro Abs: 8.4 10*3/uL — ABNORMAL HIGH (ref 1.7–7.7)
Neutrophils Relative %: 83 %
Platelets: 141 10*3/uL — ABNORMAL LOW (ref 150–400)
RBC: 4.83 MIL/uL (ref 4.22–5.81)
RDW: 13 % (ref 11.5–15.5)
Smear Review: ADEQUATE
WBC: 10.1 10*3/uL (ref 4.0–10.5)
nRBC: 0 % (ref 0.0–0.2)

## 2021-07-14 LAB — COMPREHENSIVE METABOLIC PANEL
ALT: 33 U/L (ref 0–44)
AST: 28 U/L (ref 15–41)
Albumin: 4 g/dL (ref 3.5–5.0)
Alkaline Phosphatase: 50 U/L (ref 38–126)
Anion gap: 10 (ref 5–15)
BUN: 16 mg/dL (ref 8–23)
CO2: 26 mmol/L (ref 22–32)
Calcium: 9.5 mg/dL (ref 8.9–10.3)
Chloride: 96 mmol/L — ABNORMAL LOW (ref 98–111)
Creatinine, Ser: 0.9 mg/dL (ref 0.61–1.24)
GFR, Estimated: >60 mL/min (ref >60)
Glucose, Bld: 111 mg/dL — ABNORMAL HIGH (ref 70–99)
Potassium: 4 mmol/L (ref 3.5–5.1)
Sodium: 132 mmol/L — ABNORMAL LOW (ref 135–145)
Total Bilirubin: 0.6 mg/dL (ref 0.3–1.2)
Total Protein: 7 g/dL (ref 6.5–8.1)

## 2021-07-14 MED ORDER — MORPHINE SULFATE (PF) 4 MG/ML IV SOLN
INTRAVENOUS | Status: AC
  Filled 2021-07-14: qty 1

## 2021-07-14 MED ORDER — ONDANSETRON HCL 4 MG/2ML IJ SOLN
4.0000 mg | Freq: Once | INTRAMUSCULAR | Status: AC
  Administered 2021-07-14: 4 mg via INTRAVENOUS
  Filled 2021-07-14: qty 2

## 2021-07-14 MED ORDER — MORPHINE SULFATE (PF) 4 MG/ML IV SOLN
4.0000 mg | Freq: Once | INTRAVENOUS | Status: AC
  Administered 2021-07-14: 4 mg via INTRAVENOUS

## 2021-07-14 MED ORDER — MORPHINE SULFATE (PF) 4 MG/ML IV SOLN
4.0000 mg | Freq: Once | INTRAVENOUS | Status: AC
Start: 2021-07-14 — End: 2021-07-14
  Administered 2021-07-14: 4 mg via INTRAVENOUS
  Filled 2021-07-14: qty 1

## 2021-07-14 NOTE — ED Notes (Signed)
Patient transported to X-ray 

## 2021-07-14 NOTE — ED Triage Notes (Addendum)
Pt assisted out of vehicle by 2 staff members. Family states pt had a fall and was found lying on the ground outside house around 1400. Pt states felt dizzy and fell, 2 hematomas noted to posterior head. Unable to move right leg, c/o right hip pain. Unclear of exact events as pt has memory issues at baseline. Sensation and pulses intact to right leg.

## 2021-07-14 NOTE — ED Provider Notes (Signed)
[facility] EMERGENCY DEPARTMENT Provider Note   CSN: [id number] Arrival date & time: 07/14/21  1430     History  Chief Complaint  Patient presents with   Jeff Weaver is a 76 y.o. male.  Pt was walking and felt dizzy.  Pt reports he had pain in his right hip when he tried to stand up.  Pt hit the back of his head.  Pt does not think he lost consciousness.    He reports his neighbor attempted to help him stand up and he could not put weight on his right leg.  Patient complains of pain to the back of his head.  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  Patient's family reports that patient is mentating normally.  Patient does have some difficulty giving history.  Patient's daughter reports he does have some difficulty communicating chronically.   The history is provided by the patient. No language interpreter was used.  Fall This is a new problem. The current episode started 1 to 2 hours ago. The problem has been gradually worsening. Pertinent negatives include no chest pain and no abdominal pain. Nothing aggravates the symptoms. Nothing relieves the symptoms. He has tried nothing for the symptoms. The treatment provided no relief.      Home Medications Prior to Admission medications   Medication Sig Start Date End Date Taking? Authorizing Provider  acetaminophen (TYLENOL) 325 MG tablet Take 325-650 mg by mouth every 6 (six) hours as needed (pain/headaches.).    [provider]  aspirin 325 MG EC tablet Take 325-650 mg by mouth daily as needed for pain. Patient not taking: Reported on 06/18/2021    [provider]  aspirin EC 81 MG tablet Take 1 tablet (81 mg total) by mouth daily. 07/12/17   Dorothy Spark, MD  cetirizine (ZYRTEC) 10 MG tablet Take 1 tablet (10 mg total) by mouth daily. Patient taking differently: Take 10 mg by mouth in the morning. 10/26/15   [name], MD  Cholecalciferol (VITAMIN D) 50  MCG (2000 UT) CAPS Take 2,000 Units by mouth in the morning.    [provider]  clonazePAM (KLONOPIN) 1 MG disintegrating tablet Take 1 mg by mouth as directed. ALLOW 1 TABLET TO DISSOLVE IN CHEEK FOR SEIZURES LASTING LONGER THAN 4 MINUTES. OR AFTER 2ND SEIZURE IN 24 HOURS 11/05/19 01/16/23  [provider]  desonide (DESOWEN) 0.05 % ointment Apply 1 application topically 2 (two) times daily as needed (skin irritation).    [provider]  donepezil (ARICEPT) 10 MG tablet Take half a tablet in the morning for a month and then increase to 1 full tablet daily. 05/06/21     furosemide (LASIX) 40 MG tablet TAKE 1 TABLET (40 MG TOTAL) BY MOUTH DAILY. Patient taking differently: Take 40 mg by mouth in the morning. 11/16/20 11/16/21  Sueanne Margarita, MD  levETIRAcetam (KEPPRA) 500 MG tablet Take 1,000-1,500 mg by mouth See admin instructions. Take 2 tablets (1000 mg) by mouth in the morning & take 3 tablets (1500 mg) by mouth in the evening    [provider]  LORazepam (ATIVAN) 1 MG tablet Take 1 mg by mouth every 6 (six) hours as needed for seizure.     [provider]  menthol-zinc oxide (GOLD BOND) powder Apply 1 application topically 2 (two) times daily as needed (itching/irritation).    [provider]  miconazole (ZEASORB-AF) 2 %  powder Apply 1 application topically as needed for itching.    [provider]  Midazolam (NAYZILAM) 5 MG/0.1ML SOLN Place 1 each into the nose as needed (seizure).    [provider]  mirabegron ER (MYRBETRIQ) 50 MG TB24 tablet Take 1 tablet (50 mg total) by mouth daily. Patient taking differently: Take 50 mg by mouth every evening. 02/28/20   Burns, Claudina Lick, MD  nitroGLYCERIN (NITROSTAT) 0.4 MG SL tablet PLACE 1 TABLET (0.4 MG TOTAL) UNDER THE TONGUE EVERY 5 (FIVE) MINUTES AS NEEDED FOR CHEST PAIN. Patient taking differently: Place 0.4 mg under the tongue every 5 (five) minutes x 3 doses as needed for chest  pain. 08/25/20 08/25/21  Dorothy Spark, MD  NON FORMULARY CPAP Machine: authorized by [facility] in [facility]     [provider]  omeprazole (PRILOSEC) 40 MG capsule Take 1 capsule (40 mg total) by mouth daily as needed. 06/11/21   [name], MD  Propylene Glycol (SYSTANE BALANCE) 0.6 % SOLN Apply 1 drop to eye daily as needed (dry eyes).    [provider]  pyridOXINE (VITAMIN B-6) 100 MG tablet Take 100 mg by mouth in the morning.    [provider]  rosuvastatin (CRESTOR) 5 MG tablet TAKE 1 TABLET BY MOUTH THREE TIMES A WEEK AND INCREASE AS TOLERATED Patient taking differently: Take 5 mg by mouth every Monday, Wednesday, and Friday. In the evening 11/16/20 11/16/21  Sueanne Margarita, MD  spironolactone (ALDACTONE) 25 MG tablet Take 0.5 tablets (12.5 mg total) by mouth daily. 05/25/21   Loel Dubonnet, NP  spironolactone (ALDACTONE) 25 MG tablet Take 0.5 tablets (12.5 mg total) by mouth daily. Patient not taking: Reported on 06/18/2021 05/25/21 11/21/21  Loel Dubonnet, NP  Tamsulosin HCl (FLOMAX) 0.4 MG CAPS Take 0.4 mg by mouth every evening.    [provider]  thiamine 100 MG tablet Take 100 mg by mouth in the morning.    [provider]  tolnaftate (TINACTIN) 1 % spray Apply topically 2 (two) times daily as needed (itching).    [provider]  traZODone (DESYREL) 50 MG tablet Take 1 tablet by mouth at bedtime. 04/29/21   [provider]  VIMPAT 200 MG TABS tablet Take 200 mg by mouth 2 (two) times daily. 09/03/19   [provider]  vitamin B-12 (CYANOCOBALAMIN) 1000 MCG tablet Take 1,000 mcg by mouth in the morning.    [provider]      Allergies    Haloperidol, Rosuvastatin, Simvastatin, Lisinopril, Nifedipine, and Pravastatin    Review of Systems   Review of Systems  Cardiovascular:  Negative for chest pain.  Gastrointestinal:  Negative for abdominal pain.  All other systems reviewed and are  negative.  Physical Exam Updated Vital Signs BP (!) 187/80    Pulse 62    Temp 98.2 F (36.8 C) (Oral)    Resp (!) 23    Ht 5' 8"  (1.727 m)    Wt 107 kg    SpO2 100%    BMI 35.87 kg/m  Physical Exam Vitals and nursing note reviewed.  Constitutional:      Appearance: Normal appearance. He is well-developed and normal weight.  HENT:     Head: Normocephalic.     Comments: 2 large 3cm contusion with swelling scalp.      Right Ear: Tympanic membrane normal.     Left Ear: Tympanic membrane normal.     Mouth/Throat:     Mouth:  Mucous membranes are moist.  Eyes:     Extraocular Movements: Extraocular movements intact.     Pupils: Pupils are equal, round, and reactive to light.  Cardiovascular:     Rate and Rhythm: Normal rate.  Pulmonary:     Effort: Pulmonary effort is normal.  Abdominal:     General: Abdomen is flat. There is no distension.  Musculoskeletal:        General: Tenderness present.     Cervical back: Normal range of motion.     Comments: Right leg shortened and rotated.  Pain with movement nv and ns intact   Skin:    General: Skin is warm.  Neurological:     General: No focal deficit present.     Mental Status: He is alert and oriented to person, place, and time.  Psychiatric:        Mood and Affect: Mood normal.    ED Results / Procedures / Treatments   Labs (all labs ordered are listed, but only abnormal results are displayed) Labs Reviewed  RESP PANEL BY RT-PCR (FLU A&B, COVID) ARPGX2  CBC WITH DIFFERENTIAL/PLATELET  COMPREHENSIVE METABOLIC PANEL    EKG EKG Interpretation  Date/Time:  Wednesday July 14 2021 14:41:24 EST Ventricular Rate:  66 PR Interval:  180 QRS Duration: 129 QT Interval:  430 QTC Calculation: 451 R Axis:   10 Text Interpretation: A-sensed V-pacing IVCD, consider atypical RBBB Anterolateral infarct, old Confirmed by Calvert Cantor [phone]) on 07/14/2021 2:57:09 PM  Radiology DG Chest 2 View  Result Date: 07/14/2021 CLINICAL  DATA:  Fall. EXAM: CHEST - 2 VIEW COMPARISON:  Chest x-ray dated June 14, 2021. FINDINGS: Unchanged left chest wall pacemaker. Unchanged mild cardiomegaly. Normal pulmonary vascularity. No focal consolidation, pleural effusion, or pneumothorax. Unchanged VP shunt tubing overlying the chest wall. IMPRESSION: 1. No acute cardiopulmonary disease. Electronically Signed   By: Titus Dubin M.D.   On: 07/14/2021 15:31   CT Head Wo Contrast  Result Date: 07/14/2021 CLINICAL DATA:  Facial trauma, blunt; Head trauma, moderate-severe EXAM: CT HEAD WITHOUT CONTRAST CT CERVICAL SPINE WITHOUT CONTRAST TECHNIQUE: Multidetector CT imaging of the head and cervical spine was performed following the standard protocol without intravenous contrast. Multiplanar CT image reconstructions of the cervical spine were also generated. RADIATION DOSE REDUCTION: This exam was performed according to the departmental dose-optimization program which includes automated exposure control, adjustment of the mA and/or kV according to patient size and/or use of iterative reconstruction technique. COMPARISON:  Multiple prior head CTs.  CT neck 09/03/2014. FINDINGS: CT HEAD FINDINGS Brain: There are bilateral, nearly holohemispheric subdural hematomas, measuring up to 4 mm on the right (Ng series 4, image 44, and 3 mm on the left (series 4, image 29). The right-sided subdural hematoma extends to along the anterior aspect of the temporal lobe (sagittal image 16). These appear acute. 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 Unchanged appearance of the cerebellum with atrophic changes and or encephalomalacia. There is a left frontal approach ventriculostomy catheter unchanged in position in comparison prior exam in the region of the foraminal [place].Ventricular system is unchanged in size with mild ex vacuo dilation of the left frontal horn due to adjacent cephalo malacia. Vascular: No hyperdense  vessel. Vascular calcifications. There is evidence of prior aneurysm clipping/coiling in the region of the terminal ICA on the left. Skull: Prior left frontoparietal craniotomy. Sinuses/Orbits: Minimal ethmoid air cell mucosal thickening. No acute abnormality. Other: None. CT CERVICAL SPINE FINDINGS Alignment: Normal Skull  base and vertebrae: There is chronic 40% height loss of C7, unchanged from prior CT in March 2016. There is no evidence of acute cervical spine fracture. Soft tissues and spinal canal: No prevertebral fluid or swelling. No visible canal hematoma. Disc levels: There is multilevel degenerative disc disease, mild-to-moderate worst at C7-T1. There is moderate-severe multilevel facet arthropathy with bony fusion on the right at C2-C3. C1-C2 degenerative changes. Upper chest: Negative Other: None IMPRESSION: Acute, bilateral subdural hematomas measuring up to 4 mm on the right and 3 mm on the left. No significant midline shift or mass effect at this time. Stable position of left frontal approach ventriculostomy catheter with unchanged size of the ventricular system. Unchanged left frontal encephalomalacia. No evidence of acute cervical spine fracture. Unchanged chronic height loss of the C7 vertebral body. Critical Value/emergent results were called by telephone at the time of interpretation on 07/14/2021 at 3:54 pm to provider [facility] , who verbally acknowledged these results. Electronically Signed   By: Maurine Simmering M.D.   On: 07/14/2021 15:55   CT Cervical Spine Wo Contrast  Result Date: 07/14/2021 CLINICAL DATA:  Facial trauma, blunt; Head trauma, moderate-severe EXAM: CT HEAD WITHOUT CONTRAST CT CERVICAL SPINE WITHOUT CONTRAST TECHNIQUE: Multidetector CT imaging of the head and cervical spine was performed following the standard protocol without intravenous contrast. Multiplanar CT image reconstructions of the cervical spine were also generated. RADIATION DOSE REDUCTION: This exam was  performed according to the departmental dose-optimization program which includes automated exposure control, adjustment of the mA and/or kV according to patient size and/or use of iterative reconstruction technique. COMPARISON:  Multiple prior head CTs.  CT neck 09/03/2014. FINDINGS: CT HEAD FINDINGS Brain: There are bilateral, nearly holohemispheric subdural hematomas, measuring up to 4 mm on the right (Ng series 4, image 44, and 3 mm on the left (series 4, image 29). The right-sided subdural hematoma extends to along the anterior aspect of the temporal lobe (sagittal image 16). These appear acute. 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 Unchanged appearance of the cerebellum with atrophic changes and or encephalomalacia. There is a left frontal approach ventriculostomy catheter unchanged in position in comparison prior exam in the region of the foraminal [place].Ventricular system is unchanged in size with mild ex vacuo dilation of the left frontal horn due to adjacent cephalo malacia. Vascular: No hyperdense vessel. Vascular calcifications. There is evidence of prior aneurysm clipping/coiling in the region of the terminal ICA on the left. Skull: Prior left frontoparietal craniotomy. Sinuses/Orbits: Minimal ethmoid air cell mucosal thickening. No acute abnormality. Other: None. CT CERVICAL SPINE FINDINGS Alignment: Normal Skull base and vertebrae: There is chronic 40% height loss of C7, unchanged from prior CT in March 2016. There is no evidence of acute cervical spine fracture. Soft tissues and spinal canal: No prevertebral fluid or swelling. No visible canal hematoma. Disc levels: There is multilevel degenerative disc disease, mild-to-moderate worst at C7-T1. There is moderate-severe multilevel facet arthropathy with bony fusion on the right at C2-C3. C1-C2 degenerative changes. Upper chest: Negative Other: None IMPRESSION: Acute, bilateral subdural hematomas  measuring up to 4 mm on the right and 3 mm on the left. No significant midline shift or mass effect at this time. Stable position of left frontal approach ventriculostomy catheter with unchanged size of the ventricular system. Unchanged left frontal encephalomalacia. No evidence of acute cervical spine fracture. Unchanged chronic height loss of the C7 vertebral body. Critical Value/emergent results were called by telephone at the  time of interpretation on 07/14/2021 at 3:54 pm to provider [facility] , who verbally acknowledged these results. Electronically Signed   By: Maurine Simmering M.D.   On: 07/14/2021 15:55   DG Hip Unilat W or Wo Pelvis 2-3 Views Right  Result Date: 07/14/2021 CLINICAL DATA:  Fall.  Right hip pain. EXAM: DG HIP (WITH OR WITHOUT PELVIS) 2-3V RIGHT COMPARISON:  Bilateral hip x-rays dated February 10, 2021. FINDINGS: Acute right femoral neck fracture with varus angulation. No dislocation. The hip joint spaces are relatively preserved. The pubic symphysis and sacroiliac joints are intact. Unchanged shunt catheter tubing in the pelvis. IMPRESSION: 1. Acute right femoral neck fracture. Electronically Signed   By: Titus Dubin M.D.   On: 07/14/2021 15:29    Procedures .Critical Care Performed by: Fransico Meadow, PA-C Authorized by: Fransico Meadow, PA-C   Critical care provider statement:    Critical care time (minutes):  45   Critical care start time:  07/14/2021 2:45 PM   Critical care end time:  07/14/2021 5:00 PM   Critical care time was exclusive of:  Separately billable procedures and treating other patients   Critical care was necessary to treat or prevent imminent or life-threatening deterioration of the following conditions:  Trauma   Critical care was time spent personally by me on the following activities:  Development of treatment plan with patient or surrogate, discussions with consultants, evaluation of patient's response to treatment, examination of patient, ordering and  review of laboratory studies, ordering and review of radiographic studies, ordering and performing treatments and interventions, pulse oximetry, re-evaluation of patient's condition and review of old charts   Care discussed with: accepting provider at another facility      Medications Ordered in ED Medications  morphine 4 MG/ML injection 4 mg (4 mg Intravenous Given 07/14/21 1628)  ondansetron (ZOFRAN) injection 4 mg (4 mg Intravenous Given 07/14/21 1628)    ED Course/ Medical Decision Making/ A&P                           Medical Decision Making Problems Addressed: Closed displaced fracture of right femoral neck ([place]): acute illness or injury that poses a threat to life or bodily functions    Details: Right femoral neck fracture from acute fall Fall, initial encounter: acute illness or injury that poses a threat to life or bodily functions Subdural hematoma: acute illness or injury that poses a threat to life or bodily functions    Details: Ct scan shows bilat acute subdural  Amount and/or Complexity of Data Reviewed Independent Historian: spouse    Details: Daughter and wife External Data Reviewed: notes.    Details: Neurosurgeon at [facility]: ordered and independent interpretation performed. Decision-making details documented in ED Course.  Critical Care Total time providing critical care: 30-74 minutes          Final Clinical Impression(s) / ED Diagnoses Final diagnoses:  Subdural hematoma  Closed displaced fracture of right femoral neck ([place])  Fall, initial encounter    Rx / DC Orders ED Discharge Orders     None         Sidney Ace 07/14/21 1705    Drenda Freeze, MD 07/19/21 [phone]

## 2021-07-14 NOTE — ED Notes (Signed)
PA Sofia at bedside to assess patient

## 2021-07-27 ENCOUNTER — Encounter (HOSPITAL_BASED_OUTPATIENT_CLINIC_OR_DEPARTMENT_OTHER): Payer: Self-pay

## 2021-07-27 NOTE — Telephone Encounter (Signed)
Just an update

## 2021-07-28 ENCOUNTER — Ambulatory Visit (HOSPITAL_BASED_OUTPATIENT_CLINIC_OR_DEPARTMENT_OTHER): Payer: Medicare Other | Admitting: Cardiology

## 2021-08-06 DIAGNOSIS — E785 Hyperlipidemia, unspecified: Secondary | ICD-10-CM | POA: Diagnosis not present

## 2021-08-06 DIAGNOSIS — M8580 Other specified disorders of bone density and structure, unspecified site: Secondary | ICD-10-CM | POA: Diagnosis not present

## 2021-08-06 DIAGNOSIS — Z96641 Presence of right artificial hip joint: Secondary | ICD-10-CM | POA: Insufficient documentation

## 2021-08-06 DIAGNOSIS — G40909 Epilepsy, unspecified, not intractable, without status epilepticus: Secondary | ICD-10-CM | POA: Diagnosis not present

## 2021-08-06 DIAGNOSIS — I428 Other cardiomyopathies: Secondary | ICD-10-CM | POA: Diagnosis not present

## 2021-08-06 DIAGNOSIS — B9620 Unspecified Escherichia coli [E. coli] as the cause of diseases classified elsewhere: Secondary | ICD-10-CM | POA: Diagnosis not present

## 2021-08-06 DIAGNOSIS — G4733 Obstructive sleep apnea (adult) (pediatric): Secondary | ICD-10-CM | POA: Diagnosis not present

## 2021-08-06 DIAGNOSIS — E559 Vitamin D deficiency, unspecified: Secondary | ICD-10-CM | POA: Diagnosis not present

## 2021-08-06 DIAGNOSIS — I6200 Nontraumatic subdural hemorrhage, unspecified: Secondary | ICD-10-CM | POA: Diagnosis not present

## 2021-08-06 DIAGNOSIS — F03918 Unspecified dementia, unspecified severity, with other behavioral disturbance: Secondary | ICD-10-CM | POA: Diagnosis not present

## 2021-08-06 DIAGNOSIS — Z1331 Encounter for screening for depression: Secondary | ICD-10-CM | POA: Diagnosis not present

## 2021-08-06 DIAGNOSIS — F0280 Dementia in other diseases classified elsewhere without behavioral disturbance: Secondary | ICD-10-CM | POA: Diagnosis not present

## 2021-08-06 DIAGNOSIS — S2231XD Fracture of one rib, right side, subsequent encounter for fracture with routine healing: Secondary | ICD-10-CM | POA: Diagnosis not present

## 2021-08-06 DIAGNOSIS — R55 Syncope and collapse: Secondary | ICD-10-CM | POA: Diagnosis not present

## 2021-08-06 DIAGNOSIS — Z982 Presence of cerebrospinal fluid drainage device: Secondary | ICD-10-CM | POA: Diagnosis not present

## 2021-08-06 DIAGNOSIS — S7291XD Unspecified fracture of right femur, subsequent encounter for closed fracture with routine healing: Secondary | ICD-10-CM | POA: Diagnosis not present

## 2021-08-06 DIAGNOSIS — Z743 Need for continuous supervision: Secondary | ICD-10-CM | POA: Diagnosis not present

## 2021-08-06 DIAGNOSIS — M1612 Unilateral primary osteoarthritis, left hip: Secondary | ICD-10-CM | POA: Diagnosis not present

## 2021-08-06 DIAGNOSIS — R569 Unspecified convulsions: Secondary | ICD-10-CM | POA: Diagnosis not present

## 2021-08-06 DIAGNOSIS — F0282 Dementia in other diseases classified elsewhere, unspecified severity, with psychotic disturbance: Secondary | ICD-10-CM | POA: Diagnosis not present

## 2021-08-06 DIAGNOSIS — S065XAD Traumatic subdural hemorrhage with loss of consciousness status unknown, subsequent encounter: Secondary | ICD-10-CM | POA: Diagnosis not present

## 2021-08-06 DIAGNOSIS — G4700 Insomnia, unspecified: Secondary | ICD-10-CM | POA: Diagnosis not present

## 2021-08-06 DIAGNOSIS — Z888 Allergy status to other drugs, medicaments and biological substances status: Secondary | ICD-10-CM | POA: Diagnosis not present

## 2021-08-06 DIAGNOSIS — S065XAA Traumatic subdural hemorrhage with loss of consciousness status unknown, initial encounter: Secondary | ICD-10-CM | POA: Diagnosis not present

## 2021-08-06 DIAGNOSIS — M5136 Other intervertebral disc degeneration, lumbar region: Secondary | ICD-10-CM | POA: Diagnosis not present

## 2021-08-06 DIAGNOSIS — Z7901 Long term (current) use of anticoagulants: Secondary | ICD-10-CM | POA: Diagnosis not present

## 2021-08-06 DIAGNOSIS — E119 Type 2 diabetes mellitus without complications: Secondary | ICD-10-CM | POA: Diagnosis not present

## 2021-08-06 DIAGNOSIS — G9389 Other specified disorders of brain: Secondary | ICD-10-CM | POA: Diagnosis not present

## 2021-08-06 DIAGNOSIS — Z8679 Personal history of other diseases of the circulatory system: Secondary | ICD-10-CM | POA: Diagnosis not present

## 2021-08-06 DIAGNOSIS — B309 Viral conjunctivitis, unspecified: Secondary | ICD-10-CM | POA: Diagnosis not present

## 2021-08-06 DIAGNOSIS — W19XXXD Unspecified fall, subsequent encounter: Secondary | ICD-10-CM | POA: Diagnosis not present

## 2021-08-06 DIAGNOSIS — M5134 Other intervertebral disc degeneration, thoracic region: Secondary | ICD-10-CM | POA: Diagnosis not present

## 2021-08-06 DIAGNOSIS — Y939 Activity, unspecified: Secondary | ICD-10-CM | POA: Diagnosis not present

## 2021-08-06 DIAGNOSIS — S79929A Unspecified injury of unspecified thigh, initial encounter: Secondary | ICD-10-CM | POA: Diagnosis not present

## 2021-08-06 DIAGNOSIS — K5900 Constipation, unspecified: Secondary | ICD-10-CM | POA: Diagnosis not present

## 2021-08-06 DIAGNOSIS — I110 Hypertensive heart disease with heart failure: Secondary | ICD-10-CM | POA: Diagnosis not present

## 2021-08-06 DIAGNOSIS — R32 Unspecified urinary incontinence: Secondary | ICD-10-CM | POA: Diagnosis not present

## 2021-08-06 DIAGNOSIS — J309 Allergic rhinitis, unspecified: Secondary | ICD-10-CM | POA: Diagnosis not present

## 2021-08-06 DIAGNOSIS — G3183 Dementia with Lewy bodies: Secondary | ICD-10-CM | POA: Diagnosis not present

## 2021-08-06 DIAGNOSIS — W19XXXA Unspecified fall, initial encounter: Secondary | ICD-10-CM | POA: Diagnosis not present

## 2021-08-06 DIAGNOSIS — I10 Essential (primary) hypertension: Secondary | ICD-10-CM | POA: Diagnosis not present

## 2021-08-06 DIAGNOSIS — I5020 Unspecified systolic (congestive) heart failure: Secondary | ICD-10-CM | POA: Diagnosis not present

## 2021-08-06 DIAGNOSIS — Z471 Aftercare following joint replacement surgery: Secondary | ICD-10-CM | POA: Diagnosis not present

## 2021-08-06 DIAGNOSIS — S72001D Fracture of unspecified part of neck of right femur, subsequent encounter for closed fracture with routine healing: Secondary | ICD-10-CM | POA: Diagnosis not present

## 2021-08-06 DIAGNOSIS — N390 Urinary tract infection, site not specified: Secondary | ICD-10-CM | POA: Diagnosis not present

## 2021-08-06 DIAGNOSIS — S72091D Other fracture of head and neck of right femur, subsequent encounter for closed fracture with routine healing: Secondary | ICD-10-CM | POA: Diagnosis not present

## 2021-08-06 DIAGNOSIS — F5101 Primary insomnia: Secondary | ICD-10-CM | POA: Diagnosis not present

## 2021-08-06 DIAGNOSIS — S2241XD Multiple fractures of ribs, right side, subsequent encounter for fracture with routine healing: Secondary | ICD-10-CM | POA: Diagnosis not present

## 2021-08-06 DIAGNOSIS — G3184 Mild cognitive impairment, so stated: Secondary | ICD-10-CM | POA: Diagnosis not present

## 2021-08-06 DIAGNOSIS — S72001A Fracture of unspecified part of neck of right femur, initial encounter for closed fracture: Secondary | ICD-10-CM | POA: Diagnosis not present

## 2021-08-06 DIAGNOSIS — M1909 Primary osteoarthritis, other specified site: Secondary | ICD-10-CM | POA: Diagnosis not present

## 2021-08-09 DIAGNOSIS — S7291XD Unspecified fracture of right femur, subsequent encounter for closed fracture with routine healing: Secondary | ICD-10-CM | POA: Diagnosis not present

## 2021-08-09 DIAGNOSIS — I10 Essential (primary) hypertension: Secondary | ICD-10-CM | POA: Diagnosis not present

## 2021-08-09 DIAGNOSIS — S065XAD Traumatic subdural hemorrhage with loss of consciousness status unknown, subsequent encounter: Secondary | ICD-10-CM | POA: Diagnosis not present

## 2021-08-09 DIAGNOSIS — S2231XD Fracture of one rib, right side, subsequent encounter for fracture with routine healing: Secondary | ICD-10-CM | POA: Diagnosis not present

## 2021-08-09 DIAGNOSIS — F0280 Dementia in other diseases classified elsewhere without behavioral disturbance: Secondary | ICD-10-CM | POA: Diagnosis not present

## 2021-08-09 DIAGNOSIS — G40909 Epilepsy, unspecified, not intractable, without status epilepticus: Secondary | ICD-10-CM | POA: Diagnosis not present

## 2021-08-09 DIAGNOSIS — G3183 Dementia with Lewy bodies: Secondary | ICD-10-CM | POA: Diagnosis not present

## 2021-08-09 DIAGNOSIS — E785 Hyperlipidemia, unspecified: Secondary | ICD-10-CM | POA: Diagnosis not present

## 2021-08-09 DIAGNOSIS — K5900 Constipation, unspecified: Secondary | ICD-10-CM | POA: Diagnosis not present

## 2021-08-11 DIAGNOSIS — G3183 Dementia with Lewy bodies: Secondary | ICD-10-CM | POA: Diagnosis not present

## 2021-08-11 DIAGNOSIS — G40909 Epilepsy, unspecified, not intractable, without status epilepticus: Secondary | ICD-10-CM | POA: Diagnosis not present

## 2021-08-18 DIAGNOSIS — G3183 Dementia with Lewy bodies: Secondary | ICD-10-CM | POA: Diagnosis not present

## 2021-08-18 DIAGNOSIS — G40909 Epilepsy, unspecified, not intractable, without status epilepticus: Secondary | ICD-10-CM | POA: Diagnosis not present

## 2021-08-19 DIAGNOSIS — F03918 Unspecified dementia, unspecified severity, with other behavioral disturbance: Secondary | ICD-10-CM | POA: Diagnosis not present

## 2021-08-19 DIAGNOSIS — F5101 Primary insomnia: Secondary | ICD-10-CM | POA: Diagnosis not present

## 2021-08-24 DIAGNOSIS — G40909 Epilepsy, unspecified, not intractable, without status epilepticus: Secondary | ICD-10-CM | POA: Diagnosis not present

## 2021-08-24 DIAGNOSIS — I10 Essential (primary) hypertension: Secondary | ICD-10-CM | POA: Diagnosis not present

## 2021-08-25 DIAGNOSIS — G9389 Other specified disorders of brain: Secondary | ICD-10-CM | POA: Diagnosis not present

## 2021-08-25 DIAGNOSIS — S065XAA Traumatic subdural hemorrhage with loss of consciousness status unknown, initial encounter: Secondary | ICD-10-CM | POA: Diagnosis not present

## 2021-08-25 DIAGNOSIS — Z982 Presence of cerebrospinal fluid drainage device: Secondary | ICD-10-CM | POA: Diagnosis not present

## 2021-08-27 ENCOUNTER — Telehealth: Payer: Self-pay | Admitting: Cardiology

## 2021-08-27 ENCOUNTER — Ambulatory Visit (INDEPENDENT_AMBULATORY_CARE_PROVIDER_SITE_OTHER): Payer: Medicare Other

## 2021-08-27 DIAGNOSIS — I428 Other cardiomyopathies: Secondary | ICD-10-CM | POA: Diagnosis not present

## 2021-08-27 LAB — CUP PACEART REMOTE DEVICE CHECK
Battery Remaining Longevity: 129 mo
Battery Voltage: 3.12 V
Brady Statistic AP VP Percent: 59.39 %
Brady Statistic AP VS Percent: 0.01 %
Brady Statistic AS VP Percent: 39.74 %
Brady Statistic AS VS Percent: 0.86 %
Brady Statistic RA Percent Paced: 59.77 %
Brady Statistic RV Percent Paced: 99.12 %
Date Time Interrogation Session: 20230223235349
Implantable Lead Implant Date: 20191230
Implantable Lead Implant Date: 20191230
Implantable Lead Implant Date: 20220907
Implantable Lead Location: 753858
Implantable Lead Location: 753859
Implantable Lead Location: 753860
Implantable Lead Model: 4598
Implantable Lead Model: 5076
Implantable Lead Model: 5076
Implantable Pulse Generator Implant Date: 20220907
Lead Channel Impedance Value: 304 Ohm
Lead Channel Impedance Value: 342 Ohm
Lead Channel Impedance Value: 380 Ohm
Lead Channel Impedance Value: 380 Ohm
Lead Channel Impedance Value: 399 Ohm
Lead Channel Impedance Value: 418 Ohm
Lead Channel Impedance Value: 437 Ohm
Lead Channel Impedance Value: 475 Ohm
Lead Channel Impedance Value: 570 Ohm
Lead Channel Impedance Value: 646 Ohm
Lead Channel Impedance Value: 665 Ohm
Lead Channel Impedance Value: 703 Ohm
Lead Channel Impedance Value: 703 Ohm
Lead Channel Impedance Value: 741 Ohm
Lead Channel Pacing Threshold Amplitude: 0.625 V
Lead Channel Pacing Threshold Amplitude: 0.625 V
Lead Channel Pacing Threshold Amplitude: 1.125 V
Lead Channel Pacing Threshold Pulse Width: 0.4 ms
Lead Channel Pacing Threshold Pulse Width: 0.4 ms
Lead Channel Pacing Threshold Pulse Width: 0.4 ms
Lead Channel Sensing Intrinsic Amplitude: 1.5 mV
Lead Channel Sensing Intrinsic Amplitude: 1.5 mV
Lead Channel Sensing Intrinsic Amplitude: 12.875 mV
Lead Channel Setting Pacing Amplitude: 1.25 V
Lead Channel Setting Pacing Amplitude: 1.5 V
Lead Channel Setting Pacing Amplitude: 2.25 V
Lead Channel Setting Pacing Pulse Width: 0.4 ms
Lead Channel Setting Pacing Pulse Width: 0.4 ms
Lead Channel Setting Sensing Sensitivity: 1.2 mV

## 2021-08-27 NOTE — Telephone Encounter (Signed)
I did not need this encounter. °

## 2021-08-30 DIAGNOSIS — B309 Viral conjunctivitis, unspecified: Secondary | ICD-10-CM | POA: Diagnosis not present

## 2021-08-31 NOTE — Progress Notes (Signed)
Remote pacemaker transmission.   

## 2021-09-07 DIAGNOSIS — M1612 Unilateral primary osteoarthritis, left hip: Secondary | ICD-10-CM | POA: Diagnosis not present

## 2021-09-07 DIAGNOSIS — S72091D Other fracture of head and neck of right femur, subsequent encounter for closed fracture with routine healing: Secondary | ICD-10-CM | POA: Diagnosis not present

## 2021-09-07 DIAGNOSIS — M1909 Primary osteoarthritis, other specified site: Secondary | ICD-10-CM | POA: Diagnosis not present

## 2021-09-07 DIAGNOSIS — S72001D Fracture of unspecified part of neck of right femur, subsequent encounter for closed fracture with routine healing: Secondary | ICD-10-CM | POA: Diagnosis not present

## 2021-09-07 DIAGNOSIS — Z471 Aftercare following joint replacement surgery: Secondary | ICD-10-CM | POA: Diagnosis not present

## 2021-09-07 DIAGNOSIS — Z888 Allergy status to other drugs, medicaments and biological substances status: Secondary | ICD-10-CM | POA: Diagnosis not present

## 2021-09-07 DIAGNOSIS — Z96641 Presence of right artificial hip joint: Secondary | ICD-10-CM | POA: Diagnosis not present

## 2021-09-08 DIAGNOSIS — S2231XD Fracture of one rib, right side, subsequent encounter for fracture with routine healing: Secondary | ICD-10-CM | POA: Diagnosis not present

## 2021-09-08 DIAGNOSIS — K5900 Constipation, unspecified: Secondary | ICD-10-CM | POA: Diagnosis not present

## 2021-09-08 DIAGNOSIS — Z1331 Encounter for screening for depression: Secondary | ICD-10-CM | POA: Diagnosis not present

## 2021-09-08 DIAGNOSIS — S065XAD Traumatic subdural hemorrhage with loss of consciousness status unknown, subsequent encounter: Secondary | ICD-10-CM | POA: Diagnosis not present

## 2021-09-08 DIAGNOSIS — G3183 Dementia with Lewy bodies: Secondary | ICD-10-CM | POA: Diagnosis not present

## 2021-09-08 DIAGNOSIS — G40909 Epilepsy, unspecified, not intractable, without status epilepticus: Secondary | ICD-10-CM | POA: Diagnosis not present

## 2021-09-08 DIAGNOSIS — S7291XD Unspecified fracture of right femur, subsequent encounter for closed fracture with routine healing: Secondary | ICD-10-CM | POA: Diagnosis not present

## 2021-09-08 DIAGNOSIS — F0280 Dementia in other diseases classified elsewhere without behavioral disturbance: Secondary | ICD-10-CM | POA: Diagnosis not present

## 2021-09-08 DIAGNOSIS — I10 Essential (primary) hypertension: Secondary | ICD-10-CM | POA: Diagnosis not present

## 2021-09-08 DIAGNOSIS — E785 Hyperlipidemia, unspecified: Secondary | ICD-10-CM | POA: Diagnosis not present

## 2021-09-13 DIAGNOSIS — Z9842 Cataract extraction status, left eye: Secondary | ICD-10-CM | POA: Diagnosis not present

## 2021-09-13 DIAGNOSIS — G40909 Epilepsy, unspecified, not intractable, without status epilepticus: Secondary | ICD-10-CM | POA: Diagnosis not present

## 2021-09-13 DIAGNOSIS — E785 Hyperlipidemia, unspecified: Secondary | ICD-10-CM | POA: Diagnosis not present

## 2021-09-13 DIAGNOSIS — M5134 Other intervertebral disc degeneration, thoracic region: Secondary | ICD-10-CM | POA: Diagnosis not present

## 2021-09-13 DIAGNOSIS — Z9181 History of falling: Secondary | ICD-10-CM | POA: Diagnosis not present

## 2021-09-13 DIAGNOSIS — K5900 Constipation, unspecified: Secondary | ICD-10-CM | POA: Diagnosis not present

## 2021-09-13 DIAGNOSIS — S2231XD Fracture of one rib, right side, subsequent encounter for fracture with routine healing: Secondary | ICD-10-CM | POA: Diagnosis not present

## 2021-09-13 DIAGNOSIS — Z9841 Cataract extraction status, right eye: Secondary | ICD-10-CM | POA: Diagnosis not present

## 2021-09-13 DIAGNOSIS — Z7901 Long term (current) use of anticoagulants: Secondary | ICD-10-CM | POA: Diagnosis not present

## 2021-09-13 DIAGNOSIS — Z96641 Presence of right artificial hip joint: Secondary | ICD-10-CM | POA: Diagnosis not present

## 2021-09-13 DIAGNOSIS — G4733 Obstructive sleep apnea (adult) (pediatric): Secondary | ICD-10-CM | POA: Diagnosis not present

## 2021-09-13 DIAGNOSIS — G4700 Insomnia, unspecified: Secondary | ICD-10-CM | POA: Diagnosis not present

## 2021-09-13 DIAGNOSIS — H1033 Unspecified acute conjunctivitis, bilateral: Secondary | ICD-10-CM | POA: Diagnosis not present

## 2021-09-13 DIAGNOSIS — M8580 Other specified disorders of bone density and structure, unspecified site: Secondary | ICD-10-CM | POA: Diagnosis not present

## 2021-09-13 DIAGNOSIS — G3183 Dementia with Lewy bodies: Secondary | ICD-10-CM | POA: Diagnosis not present

## 2021-09-13 DIAGNOSIS — F0280 Dementia in other diseases classified elsewhere without behavioral disturbance: Secondary | ICD-10-CM | POA: Diagnosis not present

## 2021-09-13 DIAGNOSIS — S065X0D Traumatic subdural hemorrhage without loss of consciousness, subsequent encounter: Secondary | ICD-10-CM | POA: Diagnosis not present

## 2021-09-13 DIAGNOSIS — S72001D Fracture of unspecified part of neck of right femur, subsequent encounter for closed fracture with routine healing: Secondary | ICD-10-CM | POA: Diagnosis not present

## 2021-09-13 DIAGNOSIS — I10 Essential (primary) hypertension: Secondary | ICD-10-CM | POA: Diagnosis not present

## 2021-09-13 DIAGNOSIS — Z9889 Other specified postprocedural states: Secondary | ICD-10-CM | POA: Diagnosis not present

## 2021-09-13 DIAGNOSIS — B309 Viral conjunctivitis, unspecified: Secondary | ICD-10-CM | POA: Diagnosis not present

## 2021-09-13 DIAGNOSIS — M5136 Other intervertebral disc degeneration, lumbar region: Secondary | ICD-10-CM | POA: Diagnosis not present

## 2021-09-13 DIAGNOSIS — Z87891 Personal history of nicotine dependence: Secondary | ICD-10-CM | POA: Diagnosis not present

## 2021-09-13 DIAGNOSIS — J309 Allergic rhinitis, unspecified: Secondary | ICD-10-CM | POA: Diagnosis not present

## 2021-09-13 DIAGNOSIS — Z8679 Personal history of other diseases of the circulatory system: Secondary | ICD-10-CM | POA: Diagnosis not present

## 2021-09-15 DIAGNOSIS — G3183 Dementia with Lewy bodies: Secondary | ICD-10-CM | POA: Diagnosis not present

## 2021-09-15 DIAGNOSIS — S72001D Fracture of unspecified part of neck of right femur, subsequent encounter for closed fracture with routine healing: Secondary | ICD-10-CM | POA: Diagnosis not present

## 2021-09-15 DIAGNOSIS — S2231XD Fracture of one rib, right side, subsequent encounter for fracture with routine healing: Secondary | ICD-10-CM | POA: Diagnosis not present

## 2021-09-15 DIAGNOSIS — I10 Essential (primary) hypertension: Secondary | ICD-10-CM | POA: Diagnosis not present

## 2021-09-15 DIAGNOSIS — F0280 Dementia in other diseases classified elsewhere without behavioral disturbance: Secondary | ICD-10-CM | POA: Diagnosis not present

## 2021-09-15 DIAGNOSIS — S065X0D Traumatic subdural hemorrhage without loss of consciousness, subsequent encounter: Secondary | ICD-10-CM | POA: Diagnosis not present

## 2021-09-16 ENCOUNTER — Telehealth: Payer: Self-pay

## 2021-09-16 DIAGNOSIS — Z96641 Presence of right artificial hip joint: Secondary | ICD-10-CM | POA: Insufficient documentation

## 2021-09-16 DIAGNOSIS — S065X0D Traumatic subdural hemorrhage without loss of consciousness, subsequent encounter: Secondary | ICD-10-CM | POA: Diagnosis not present

## 2021-09-16 DIAGNOSIS — S2231XD Fracture of one rib, right side, subsequent encounter for fracture with routine healing: Secondary | ICD-10-CM | POA: Diagnosis not present

## 2021-09-16 DIAGNOSIS — I10 Essential (primary) hypertension: Secondary | ICD-10-CM | POA: Diagnosis not present

## 2021-09-16 DIAGNOSIS — F0280 Dementia in other diseases classified elsewhere without behavioral disturbance: Secondary | ICD-10-CM | POA: Diagnosis not present

## 2021-09-16 DIAGNOSIS — S72001D Fracture of unspecified part of neck of right femur, subsequent encounter for closed fracture with routine healing: Secondary | ICD-10-CM | POA: Diagnosis not present

## 2021-09-16 DIAGNOSIS — G3183 Dementia with Lewy bodies: Secondary | ICD-10-CM | POA: Diagnosis not present

## 2021-09-16 NOTE — Telephone Encounter (Signed)
Verbals given today ?

## 2021-09-16 NOTE — Telephone Encounter (Signed)
Pt was referred by [facility] facility he was in for a fall. ? ?Malachy Mood is calling stating that  PT, OT and Speech Eval was done. ? ?Malachy Mood is requesting verbal orders for: ? ?Speech 6 Visits ? ?Malachy Mood CB [phone] ?

## 2021-09-16 NOTE — Progress Notes (Signed)
? ? ? ? ?Subjective:  ? ? Patient ID: Jeff Weaver, male    DOB: Nov 08, 1945, 76 y.o.   MRN: [id number] ? ?This visit occurred during the SARS-CoV-2 public health emergency.  Safety protocols were in place, including screening questions prior to the visit, additional usage of staff PPE, and extensive cleaning of exam room while observing appropriate contact time as indicated for disinfecting solutions.   ? ? ?HPI ?Jeff Weaver is here for follow up of his recent hospitalization/rehab stay.   ? ?07/14/21 - ED after mechanical fall.  R fem neck fx, Ct subdural hematoma.  Transferred to [facility].  ? 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  PT/OT rec SNF.  ST consulted.  During hosp course - delirium, complicated UTI.  CXR w/ possible asp pneumonitis.  ST advised ground/nectar thick liquids.     ? ?SNF  2/3 - 3/9 ?Had PT/OT/ST.   ?Bp variable 96/54 - 167/88 ?Speech eval - just drink slow the liquids.   ? ?Now home - has PT, OT, ST - [facility] ? ?BP at home 120's ? ?Prior to this he was independent of ADL's.  He is no longer independent.   ? ?Couple of weeks ago L> R redness - drops in rehab.  Still having double vision.  Losing some peripheral vision.   Treating conjunctivitis. Redness was more of the eyelids than the whites of the eye.  ? ?Blood in urine.  Intermittent.  ? ?Red skin - b/l inner thighs.  Itchy.  In rehab - using desitin and nystatin ? ? ? ? ?Medications and allergies reviewed with patient and updated if appropriate. ? ?Current Outpatient Medications on File Prior to Visit  ?Medication Sig Dispense Refill  ? acetaminophen (TYLENOL) 325 MG tablet Take 325-650 mg by mouth every 6 (six) hours as needed (pain/headaches.).    ? amLODipine (NORVASC) 5 MG tablet Take 5 mg by mouth daily.    ? cetirizine (ZYRTEC) 10 MG tablet Take 1 tablet (10 mg total) by mouth daily. (Patient taking differently: Take 10 mg by mouth in the morning.) 30  tablet 11  ? Cholecalciferol (VITAMIN D) 50 MCG (2000 UT) CAPS Take 2,000 Units by mouth in the morning.    ? Cholecalciferol 50 MCG (2000 UT) TABS Take by mouth.    ? clonazePAM (KLONOPIN) 1 MG disintegrating tablet Take 1 mg by mouth as directed. ALLOW 1 TABLET TO DISSOLVE IN CHEEK FOR SEIZURES LASTING LONGER THAN 4 MINUTES. OR AFTER 2ND SEIZURE IN 24 HOURS    ? desonide (DESOWEN) 0.05 % ointment Apply 1 application topically 2 (two) times daily as needed (skin irritation).    ? donepezil (ARICEPT) 10 MG tablet Take half a tablet in the morning for a month and then increase to 1 full tablet daily. 30 tablet 5  ? furosemide (LASIX) 40 MG tablet TAKE 1 TABLET (40 MG TOTAL) BY MOUTH DAILY. (Patient taking differently: Take 20 mg by mouth in the morning.) 90 tablet 3  ? levETIRAcetam (KEPPRA) 500 MG tablet Take 1,000-1,500 mg by mouth See admin instructions. Take 2 tablets (1000 mg) by mouth in the morning & take 3 tablets (1500 mg) by mouth in the evening    ? levETIRAcetam (KEPPRA) 500 MG tablet Take by mouth.    ? LORazepam (ATIVAN) 1 MG tablet Take 1 mg by mouth every 6 (six) hours as needed for seizure.     ?  losartan (COZAAR) 25 MG tablet Take 25 mg by mouth daily.    ? menthol-zinc oxide (GOLD BOND) powder Apply 1 application topically 2 (two) times daily as needed (itching/irritation).    ? metoprolol succinate (TOPROL-XL) 25 MG 24 hr tablet Take 25 mg by mouth daily.    ? miconazole (ZEASORB-AF) 2 % powder Apply 1 application topically as needed for itching.    ? Midazolam (NAYZILAM) 5 MG/0.1ML SOLN Place 1 each into the nose as needed (seizure).    ? mirabegron ER (MYRBETRIQ) 50 MG TB24 tablet Take 1 tablet (50 mg total) by mouth daily. (Patient taking differently: Take 50 mg by mouth every evening.) 90 tablet 1  ? NON FORMULARY CPAP Machine: authorized by [place] in [place]     ? omeprazole (PRILOSEC) 40 MG capsule Take 1 capsule (40 mg total) by mouth daily as needed. 90 capsule 3  ? Propylene Glycol (SYSTANE  BALANCE) 0.6 % SOLN Apply 1 drop to eye daily as needed (dry eyes).    ? pyridOXINE (VITAMIN B-6) 100 MG tablet Take 100 mg by mouth in the morning.    ? rosuvastatin (CRESTOR) 5 MG tablet TAKE 1 TABLET BY MOUTH THREE TIMES A WEEK AND INCREASE AS TOLERATED (Patient taking differently: Take 5 mg by mouth every Monday, Wednesday, and Friday. In the evening) 90 tablet 3  ? spironolactone (ALDACTONE) 25 MG tablet Take 0.5 tablets (12.5 mg total) by mouth daily. 15 tablet 0  ? Tamsulosin HCl (FLOMAX) 0.4 MG CAPS Take 0.4 mg by mouth every evening.    ? thiamine 100 MG tablet Take 100 mg by mouth in the morning.    ? tolnaftate (TINACTIN) 1 % spray Apply topically 2 (two) times daily as needed (itching).    ? traZODone (DESYREL) 50 MG tablet Take 1 tablet by mouth at bedtime.    ? VIMPAT 200 MG TABS tablet Take 200 mg by mouth 2 (two) times daily.    ? vitamin B-12 (CYANOCOBALAMIN) 1000 MCG tablet Take 1,000 mcg by mouth in the morning.    ? nitroGLYCERIN (NITROSTAT) 0.4 MG SL tablet PLACE 1 TABLET (0.4 MG TOTAL) UNDER THE TONGUE EVERY 5 (FIVE) MINUTES AS NEEDED FOR CHEST PAIN. (Patient taking differently: Place 0.4 mg under the tongue every 5 (five) minutes x 3 doses as needed for chest pain.) 25 tablet 7  ? ?No current facility-administered medications on file prior to visit.  ? ? ? ?Review of Systems  ?Constitutional:  Negative for fever.  ?HENT:  Positive for postnasal drip.   ?Respiratory:  Positive for cough (sounds wet - can occur at night or just sitting - not related to eating). Negative for shortness of breath and wheezing.   ?Cardiovascular:  Positive for leg swelling. Negative for chest pain and palpitations.  ?Gastrointestinal:  Negative for abdominal pain, constipation and diarrhea.  ?Neurological:  Positive for headaches.  ? ?   ?Objective:  ? ?Vitals:  ? 09/17/21 1336  ?BP: 114/72  ?Pulse: (!) 58  ?Temp: 97.9 ?F (36.6 ?C)  ?SpO2: 98%  ? ?BP Readings from Last 3 Encounters:  ?09/17/21 114/72  ?07/14/21 (!)  176/163  ?06/18/21 136/78  ? ?Wt Readings from Last 3 Encounters:  ?09/17/21 221 lb (100.2 kg)  ?07/14/21 235 lb 14.3 oz (107 kg)  ?06/18/21 235 lb 3.2 oz (106.7 kg)  ? ?Body mass index is 33.6 kg/m?. ? ?  ?Physical Exam ?Constitutional:   ?   General: He is not in acute distress. ?  Appearance: Normal appearance. He is not ill-appearing.  ?HENT:  ?   Head: Normocephalic and atraumatic.  ?Eyes:  ?   Conjunctiva/sclera: Conjunctivae normal.  ?Cardiovascular:  ?   Rate and Rhythm: Normal rate and regular rhythm.  ?   Heart sounds: Normal heart sounds. No murmur heard. ?Pulmonary:  ?   Effort: Pulmonary effort is normal. No respiratory distress.  ?   Breath sounds: Normal breath sounds. No wheezing or rales.  ?Musculoskeletal:  ?   Right lower leg: Edema (mild) present.  ?   Left lower leg: Edema (mild) present.  ?Skin: ?   General: Skin is warm and dry.  ?   Findings: No rash.  ?Neurological:  ?   Mental Status: He is alert. Mental status is at baseline.  ?Psychiatric:     ?   Mood and Affect: Mood normal.  ? ?   ? ?Lab Results  ?Component Value Date  ? WBC 10.1 07/14/2021  ? HGB 15.1 07/14/2021  ? HCT 44.1 07/14/2021  ? PLT 141 (L) 07/14/2021  ? GLUCOSE 111 (H) 07/14/2021  ? CHOL 135 11/09/2020  ? TRIG 81 11/09/2020  ? HDL 46 11/09/2020  ? LDLDIRECT 87.6 09/19/2012  ? [place] 73 11/09/2020  ? ALT 33 07/14/2021  ? AST 28 07/14/2021  ? NA 132 (L) 07/14/2021  ? K 4.0 07/14/2021  ? CL 96 (L) 07/14/2021  ? CREATININE 0.90 07/14/2021  ? BUN 16 07/14/2021  ? CO2 26 07/14/2021  ? TSH 1.230 04/09/2021  ? INR 1.0 07/17/2017  ? HGBA1C 6.3 (H) 04/09/2021  ? MICROALBUR 1.0 11/13/2014  ? ? ? ?Assessment & Plan:  ? ? ?See Problem List for Assessment and Plan of chronic medical problems.  ? ? ?

## 2021-09-16 NOTE — Telephone Encounter (Signed)
Okay for orders ?

## 2021-09-17 ENCOUNTER — Ambulatory Visit (INDEPENDENT_AMBULATORY_CARE_PROVIDER_SITE_OTHER): Payer: Medicare Other | Admitting: Internal Medicine

## 2021-09-17 ENCOUNTER — Other Ambulatory Visit: Payer: Self-pay

## 2021-09-17 ENCOUNTER — Encounter: Payer: Self-pay | Admitting: Internal Medicine

## 2021-09-17 VITALS — BP 114/72 | HR 58 | Temp 97.9°F | Ht 68.0 in | Wt 221.0 lb

## 2021-09-17 DIAGNOSIS — S72001A Fracture of unspecified part of neck of right femur, initial encounter for closed fracture: Secondary | ICD-10-CM | POA: Diagnosis not present

## 2021-09-17 DIAGNOSIS — I5020 Unspecified systolic (congestive) heart failure: Secondary | ICD-10-CM | POA: Diagnosis not present

## 2021-09-17 DIAGNOSIS — G3183 Dementia with Lewy bodies: Secondary | ICD-10-CM | POA: Diagnosis not present

## 2021-09-17 DIAGNOSIS — I6200 Nontraumatic subdural hemorrhage, unspecified: Secondary | ICD-10-CM

## 2021-09-17 DIAGNOSIS — I10 Essential (primary) hypertension: Secondary | ICD-10-CM

## 2021-09-17 DIAGNOSIS — F0280 Dementia in other diseases classified elsewhere without behavioral disturbance: Secondary | ICD-10-CM | POA: Diagnosis not present

## 2021-09-17 DIAGNOSIS — B356 Tinea cruris: Secondary | ICD-10-CM | POA: Diagnosis not present

## 2021-09-17 DIAGNOSIS — H0100A Unspecified blepharitis right eye, upper and lower eyelids: Secondary | ICD-10-CM | POA: Diagnosis not present

## 2021-09-17 DIAGNOSIS — H0100B Unspecified blepharitis left eye, upper and lower eyelids: Secondary | ICD-10-CM

## 2021-09-17 MED ORDER — AMLODIPINE BESYLATE 5 MG PO TABS
5.0000 mg | ORAL_TABLET | Freq: Every day | ORAL | 3 refills | Status: DC
Start: 2021-09-17 — End: 2021-11-09

## 2021-09-17 MED ORDER — NYSTATIN 100000 UNIT/GM EX OINT: 1.0000 "application " | TOPICAL_OINTMENT | Freq: Two times a day (BID) | CUTANEOUS | 3 refills | Status: DC

## 2021-09-17 MED ORDER — METOPROLOL SUCCINATE ER 25 MG PO TB24: 25.0000 mg | ORAL_TABLET | Freq: Every day | ORAL | 3 refills | Status: DC

## 2021-09-17 MED ORDER — LOSARTAN POTASSIUM 25 MG PO TABS: 25.0000 mg | ORAL_TABLET | Freq: Every day | ORAL | 3 refills | Status: DC

## 2021-09-17 MED ORDER — ERYTHROMYCIN 5 MG/GM OP OINT
1.0000 | TOPICAL_OINTMENT | Freq: Every day | OPHTHALMIC | 0 refills | Status: DC
Start: 2021-09-17 — End: 2022-02-07

## 2021-09-17 NOTE — Patient Instructions (Addendum)
? ? ? ?  Medications changes include :  anti-yeast ointment for inner legs.  Antibiotic ointment for eyelids.   ? ? ?Your prescription(s) have been sent to your pharmacy.  ? ? ? ?Return in about 6 months (around 03/20/2022) for follow up. ? ?

## 2021-09-18 DIAGNOSIS — H0100A Unspecified blepharitis right eye, upper and lower eyelids: Secondary | ICD-10-CM | POA: Insufficient documentation

## 2021-09-18 DIAGNOSIS — I5020 Unspecified systolic (congestive) heart failure: Secondary | ICD-10-CM | POA: Insufficient documentation

## 2021-09-18 DIAGNOSIS — B356 Tinea cruris: Secondary | ICD-10-CM | POA: Insufficient documentation

## 2021-09-18 NOTE — Assessment & Plan Note (Signed)
S/p ORIF ?Following with ortho ?Doing PT ?

## 2021-09-18 NOTE — Assessment & Plan Note (Signed)
Subacute ?Stable ?Following with neurosurgery ?

## 2021-09-18 NOTE — Assessment & Plan Note (Signed)
Chronic ?Euvolemic ?Continue lasix 40 mg daily ?

## 2021-09-18 NOTE — Assessment & Plan Note (Signed)
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

## 2021-09-18 NOTE — Assessment & Plan Note (Signed)
New ?Rash c/w tinea infection ?Nystatin ointment bid - this may have helped in the hospital/rehab ?

## 2021-09-18 NOTE — Assessment & Plan Note (Signed)
Chronic ?BP controlled  ?Continue alodipine 5 mg daily, losartan 25 mg daily, metoprolol xl 25 mg daily ?They will monitor his BP closely - most likely we will be able to stop one of his medication  -- need to avoid hypotension ? ?

## 2021-09-18 NOTE — Assessment & Plan Note (Signed)
New diagnosis ?Following with neurology ?

## 2021-09-20 DIAGNOSIS — F0280 Dementia in other diseases classified elsewhere without behavioral disturbance: Secondary | ICD-10-CM | POA: Diagnosis not present

## 2021-09-20 DIAGNOSIS — S72001D Fracture of unspecified part of neck of right femur, subsequent encounter for closed fracture with routine healing: Secondary | ICD-10-CM | POA: Diagnosis not present

## 2021-09-20 DIAGNOSIS — I10 Essential (primary) hypertension: Secondary | ICD-10-CM | POA: Diagnosis not present

## 2021-09-20 DIAGNOSIS — S065X0D Traumatic subdural hemorrhage without loss of consciousness, subsequent encounter: Secondary | ICD-10-CM | POA: Diagnosis not present

## 2021-09-20 DIAGNOSIS — G3183 Dementia with Lewy bodies: Secondary | ICD-10-CM | POA: Diagnosis not present

## 2021-09-20 DIAGNOSIS — S2231XD Fracture of one rib, right side, subsequent encounter for fracture with routine healing: Secondary | ICD-10-CM | POA: Diagnosis not present

## 2021-09-21 ENCOUNTER — Other Ambulatory Visit (HOSPITAL_BASED_OUTPATIENT_CLINIC_OR_DEPARTMENT_OTHER): Payer: Self-pay

## 2021-09-21 DIAGNOSIS — F0280 Dementia in other diseases classified elsewhere without behavioral disturbance: Secondary | ICD-10-CM | POA: Diagnosis not present

## 2021-09-21 DIAGNOSIS — I10 Essential (primary) hypertension: Secondary | ICD-10-CM | POA: Diagnosis not present

## 2021-09-21 DIAGNOSIS — G3183 Dementia with Lewy bodies: Secondary | ICD-10-CM | POA: Diagnosis not present

## 2021-09-21 DIAGNOSIS — S72001D Fracture of unspecified part of neck of right femur, subsequent encounter for closed fracture with routine healing: Secondary | ICD-10-CM | POA: Diagnosis not present

## 2021-09-21 DIAGNOSIS — S2231XD Fracture of one rib, right side, subsequent encounter for fracture with routine healing: Secondary | ICD-10-CM | POA: Diagnosis not present

## 2021-09-21 DIAGNOSIS — S065X0D Traumatic subdural hemorrhage without loss of consciousness, subsequent encounter: Secondary | ICD-10-CM | POA: Diagnosis not present

## 2021-09-22 ENCOUNTER — Telehealth: Payer: Self-pay | Admitting: Internal Medicine

## 2021-09-22 ENCOUNTER — Other Ambulatory Visit (HOSPITAL_BASED_OUTPATIENT_CLINIC_OR_DEPARTMENT_OTHER): Payer: Self-pay

## 2021-09-22 DIAGNOSIS — S72001D Fracture of unspecified part of neck of right femur, subsequent encounter for closed fracture with routine healing: Secondary | ICD-10-CM | POA: Diagnosis not present

## 2021-09-22 DIAGNOSIS — S2231XD Fracture of one rib, right side, subsequent encounter for fracture with routine healing: Secondary | ICD-10-CM | POA: Diagnosis not present

## 2021-09-22 DIAGNOSIS — S065X0D Traumatic subdural hemorrhage without loss of consciousness, subsequent encounter: Secondary | ICD-10-CM | POA: Diagnosis not present

## 2021-09-22 DIAGNOSIS — G3183 Dementia with Lewy bodies: Secondary | ICD-10-CM | POA: Diagnosis not present

## 2021-09-22 DIAGNOSIS — I10 Essential (primary) hypertension: Secondary | ICD-10-CM | POA: Diagnosis not present

## 2021-09-22 DIAGNOSIS — F0280 Dementia in other diseases classified elsewhere without behavioral disturbance: Secondary | ICD-10-CM | POA: Diagnosis not present

## 2021-09-22 MED ORDER — NYSTATIN 100000 UNIT/GM EX POWD
1.0000 "application " | Freq: Three times a day (TID) | CUTANEOUS | 3 refills | Status: DC
  Filled 2021-09-22: qty 60, 20d supply, fill #0

## 2021-09-22 NOTE — Telephone Encounter (Signed)
Sent to pharmacy 

## 2021-09-22 NOTE — Telephone Encounter (Signed)
Called to report that pt has nystatin ointment (MYCOSTATIN) and was wondering if pt can be prescribed the powder.  ? ?Reports raw skin, and breakdown under folds of skin and under scrotum.  ?

## 2021-09-23 ENCOUNTER — Other Ambulatory Visit (HOSPITAL_BASED_OUTPATIENT_CLINIC_OR_DEPARTMENT_OTHER): Payer: Self-pay

## 2021-09-23 NOTE — Telephone Encounter (Signed)
Spoke with wife today. ?

## 2021-09-27 ENCOUNTER — Telehealth: Payer: Self-pay

## 2021-09-27 ENCOUNTER — Other Ambulatory Visit: Payer: Self-pay

## 2021-09-27 ENCOUNTER — Other Ambulatory Visit: Payer: Medicare Other

## 2021-09-27 ENCOUNTER — Encounter: Payer: Self-pay | Admitting: Internal Medicine

## 2021-09-27 DIAGNOSIS — R051 Acute cough: Secondary | ICD-10-CM

## 2021-09-27 DIAGNOSIS — R300 Dysuria: Secondary | ICD-10-CM

## 2021-09-27 DIAGNOSIS — R410 Disorientation, unspecified: Secondary | ICD-10-CM

## 2021-09-27 DIAGNOSIS — S2231XD Fracture of one rib, right side, subsequent encounter for fracture with routine healing: Secondary | ICD-10-CM | POA: Diagnosis not present

## 2021-09-27 DIAGNOSIS — S72001D Fracture of unspecified part of neck of right femur, subsequent encounter for closed fracture with routine healing: Secondary | ICD-10-CM | POA: Diagnosis not present

## 2021-09-27 DIAGNOSIS — G3183 Dementia with Lewy bodies: Secondary | ICD-10-CM | POA: Diagnosis not present

## 2021-09-27 DIAGNOSIS — S065X0D Traumatic subdural hemorrhage without loss of consciousness, subsequent encounter: Secondary | ICD-10-CM | POA: Diagnosis not present

## 2021-09-27 DIAGNOSIS — N3000 Acute cystitis without hematuria: Secondary | ICD-10-CM

## 2021-09-27 DIAGNOSIS — F0280 Dementia in other diseases classified elsewhere without behavioral disturbance: Secondary | ICD-10-CM | POA: Diagnosis not present

## 2021-09-27 DIAGNOSIS — I10 Essential (primary) hypertension: Secondary | ICD-10-CM | POA: Diagnosis not present

## 2021-09-27 DIAGNOSIS — D72829 Elevated white blood cell count, unspecified: Secondary | ICD-10-CM

## 2021-09-27 LAB — CBC WITH DIFFERENTIAL/PLATELET
Basophils Absolute: 0.2 10*3/uL — ABNORMAL HIGH (ref 0.0–0.1)
Basophils Relative: 1.1 % (ref 0.0–3.0)
Eosinophils Absolute: 0.2 10*3/uL (ref 0.0–0.7)
Eosinophils Relative: 1.3 % (ref 0.0–5.0)
HCT: 37.5 % — ABNORMAL LOW (ref 39.0–52.0)
Hemoglobin: 12.4 g/dL — ABNORMAL LOW (ref 13.0–17.0)
Lymphocytes Relative: 8.8 % — ABNORMAL LOW (ref 12.0–46.0)
Lymphs Abs: 1.5 10*3/uL (ref 0.7–4.0)
MCHC: 33 g/dL (ref 30.0–36.0)
MCV: 91 fl (ref 78.0–100.0)
Monocytes Absolute: 1 10*3/uL (ref 0.1–1.0)
Monocytes Relative: 5.7 % (ref 3.0–12.0)
Neutro Abs: 13.9 10*3/uL — ABNORMAL HIGH (ref 1.4–7.7)
Neutrophils Relative %: 83.1 % — ABNORMAL HIGH (ref 43.0–77.0)
Platelets: 198 10*3/uL (ref 150.0–400.0)
RBC: 4.12 Mil/uL — ABNORMAL LOW (ref 4.22–5.81)
RDW: 14.7 % (ref 11.5–15.5)
WBC: 16.7 10*3/uL — ABNORMAL HIGH (ref 4.0–10.5)

## 2021-09-27 NOTE — Telephone Encounter (Signed)
Lab orders placed and my-chart message sent back to daughter as well. ?

## 2021-09-27 NOTE — Telephone Encounter (Signed)
Pt daughter is calling to request that an Urine sample could be order. They noticed some on his pad and thinks that he may have another UTI.  ? ?I offered Wed. Mar 29 appt that was declined.  ? ?She asked about the triage line and I advised that I was putting in the message. She then stated that normally they put her on hold to see if it was something that could be done. I then advised that I was sending a message back right now. She states just forgot it if that all youre going to do; I can send the message and then proceeded to hang up. ? ?Please advise. ?

## 2021-09-28 ENCOUNTER — Other Ambulatory Visit: Payer: Self-pay

## 2021-09-28 DIAGNOSIS — R300 Dysuria: Secondary | ICD-10-CM

## 2021-09-28 LAB — URINALYSIS, ROUTINE W REFLEX MICROSCOPIC
Bilirubin Urine: NEGATIVE
Ketones, ur: NEGATIVE
Nitrite: NEGATIVE
Specific Gravity, Urine: 1.01 (ref 1.000–1.030)
Total Protein, Urine: 30 — AB
Urine Glucose: NEGATIVE
Urobilinogen, UA: 0.2 (ref 0.0–1.0)
pH: 6.5 (ref 5.0–8.0)

## 2021-09-28 MED ORDER — AMOXICILLIN-POT CLAVULANATE 875-125 MG PO TABS: 1.0000 | ORAL_TABLET | Freq: Two times a day (BID) | ORAL | 0 refills | Status: DC

## 2021-09-29 DIAGNOSIS — I10 Essential (primary) hypertension: Secondary | ICD-10-CM | POA: Diagnosis not present

## 2021-09-29 DIAGNOSIS — G3183 Dementia with Lewy bodies: Secondary | ICD-10-CM | POA: Diagnosis not present

## 2021-09-29 DIAGNOSIS — S065X0D Traumatic subdural hemorrhage without loss of consciousness, subsequent encounter: Secondary | ICD-10-CM | POA: Diagnosis not present

## 2021-09-29 DIAGNOSIS — S72001D Fracture of unspecified part of neck of right femur, subsequent encounter for closed fracture with routine healing: Secondary | ICD-10-CM | POA: Diagnosis not present

## 2021-09-29 DIAGNOSIS — S2231XD Fracture of one rib, right side, subsequent encounter for fracture with routine healing: Secondary | ICD-10-CM | POA: Diagnosis not present

## 2021-09-29 DIAGNOSIS — F0280 Dementia in other diseases classified elsewhere without behavioral disturbance: Secondary | ICD-10-CM | POA: Diagnosis not present

## 2021-09-29 LAB — CULTURE, URINE COMPREHENSIVE

## 2021-09-30 ENCOUNTER — Telehealth: Payer: Self-pay

## 2021-09-30 ENCOUNTER — Other Ambulatory Visit (INDEPENDENT_AMBULATORY_CARE_PROVIDER_SITE_OTHER): Payer: Medicare Other

## 2021-09-30 ENCOUNTER — Ambulatory Visit (INDEPENDENT_AMBULATORY_CARE_PROVIDER_SITE_OTHER): Payer: Medicare Other

## 2021-09-30 DIAGNOSIS — I10 Essential (primary) hypertension: Secondary | ICD-10-CM | POA: Diagnosis not present

## 2021-09-30 DIAGNOSIS — N3000 Acute cystitis without hematuria: Secondary | ICD-10-CM | POA: Diagnosis not present

## 2021-09-30 DIAGNOSIS — R051 Acute cough: Secondary | ICD-10-CM

## 2021-09-30 DIAGNOSIS — S72001D Fracture of unspecified part of neck of right femur, subsequent encounter for closed fracture with routine healing: Secondary | ICD-10-CM | POA: Diagnosis not present

## 2021-09-30 DIAGNOSIS — S2231XD Fracture of one rib, right side, subsequent encounter for fracture with routine healing: Secondary | ICD-10-CM | POA: Diagnosis not present

## 2021-09-30 DIAGNOSIS — G3183 Dementia with Lewy bodies: Secondary | ICD-10-CM | POA: Diagnosis not present

## 2021-09-30 DIAGNOSIS — D72829 Elevated white blood cell count, unspecified: Secondary | ICD-10-CM | POA: Diagnosis not present

## 2021-09-30 DIAGNOSIS — S065X0D Traumatic subdural hemorrhage without loss of consciousness, subsequent encounter: Secondary | ICD-10-CM | POA: Diagnosis not present

## 2021-09-30 DIAGNOSIS — F0280 Dementia in other diseases classified elsewhere without behavioral disturbance: Secondary | ICD-10-CM | POA: Diagnosis not present

## 2021-09-30 DIAGNOSIS — R059 Cough, unspecified: Secondary | ICD-10-CM | POA: Diagnosis not present

## 2021-09-30 LAB — COMPREHENSIVE METABOLIC PANEL
ALT: 14 U/L (ref 0–53)
AST: 11 U/L (ref 0–37)
Albumin: 3.8 g/dL (ref 3.5–5.2)
Alkaline Phosphatase: 65 U/L (ref 39–117)
BUN: 15 mg/dL (ref 6–23)
CO2: 32 mEq/L (ref 19–32)
Calcium: 9.8 mg/dL (ref 8.4–10.5)
Chloride: 100 mEq/L (ref 96–112)
Creatinine, Ser: 0.77 mg/dL (ref 0.40–1.50)
GFR: 86.44 mL/min (ref >60.00)
Glucose, Bld: 117 mg/dL — ABNORMAL HIGH (ref 70–99)
Potassium: 3.8 mEq/L (ref 3.5–5.1)
Sodium: 138 mEq/L (ref 135–145)
Total Bilirubin: 0.3 mg/dL (ref 0.2–1.2)
Total Protein: 7.3 g/dL (ref 6.0–8.3)

## 2021-09-30 LAB — CBC WITH DIFFERENTIAL/PLATELET
Basophils Absolute: 0 10*3/uL (ref 0.0–0.1)
Basophils Relative: 0.3 % (ref 0.0–3.0)
Eosinophils Absolute: 0.7 10*3/uL (ref 0.0–0.7)
Eosinophils Relative: 7.3 % — ABNORMAL HIGH (ref 0.0–5.0)
HCT: 38 % — ABNORMAL LOW (ref 39.0–52.0)
Hemoglobin: 12.6 g/dL — ABNORMAL LOW (ref 13.0–17.0)
Lymphocytes Relative: 13.8 % (ref 12.0–46.0)
Lymphs Abs: 1.3 10*3/uL (ref 0.7–4.0)
MCHC: 33.2 g/dL (ref 30.0–36.0)
MCV: 91.7 fl (ref 78.0–100.0)
Monocytes Absolute: 0.6 10*3/uL (ref 0.1–1.0)
Monocytes Relative: 5.9 % (ref 3.0–12.0)
Neutro Abs: 6.8 10*3/uL (ref 1.4–7.7)
Neutrophils Relative %: 72.7 % (ref 43.0–77.0)
Platelets: 216 10*3/uL (ref 150.0–400.0)
RBC: 4.15 Mil/uL — ABNORMAL LOW (ref 4.22–5.81)
RDW: 14.9 % (ref 11.5–15.5)
WBC: 9.4 10*3/uL (ref 4.0–10.5)

## 2021-09-30 NOTE — Telephone Encounter (Signed)
Spoke with Marzetta Board with [facility]. ? ?If spouse calls back please get them in with either Vickie or Judson Roch since Dr. Quay Burow is off until Monday. ? ?Per Stacy in order for this to be address infection in groin must be seen and evaluated and documented first before anything can be done. ? ? ?

## 2021-09-30 NOTE — Telephone Encounter (Signed)
Message was left for Ms. Jeff Weaver with this info and to return call to clinic to set up appointment for tomorrow. ?

## 2021-09-30 NOTE — Telephone Encounter (Signed)
Pt wife is requesting an order for [facility] nurse to treat the infection in his groin. ? ?[phone] [facility] ? ?She is requesting an call back asap [phone] ?

## 2021-10-01 ENCOUNTER — Encounter: Payer: Self-pay | Admitting: Internal Medicine

## 2021-10-01 ENCOUNTER — Ambulatory Visit (INDEPENDENT_AMBULATORY_CARE_PROVIDER_SITE_OTHER): Payer: Medicare Other | Admitting: Internal Medicine

## 2021-10-01 VITALS — BP 138/92 | HR 60 | Temp 97.9°F | Ht 68.0 in | Wt 226.0 lb

## 2021-10-01 DIAGNOSIS — B3789 Other sites of candidiasis: Secondary | ICD-10-CM | POA: Insufficient documentation

## 2021-10-01 DIAGNOSIS — L233 Allergic contact dermatitis due to drugs in contact with skin: Secondary | ICD-10-CM | POA: Diagnosis not present

## 2021-10-01 DIAGNOSIS — L304 Erythema intertrigo: Secondary | ICD-10-CM | POA: Insufficient documentation

## 2021-10-01 MED ORDER — TRIAMCINOLONE ACETONIDE 0.5 % EX CREA: 1.0000 "application " | TOPICAL_CREAM | Freq: Three times a day (TID) | CUTANEOUS | 1 refills | Status: DC

## 2021-10-01 MED ORDER — FLUCONAZOLE 150 MG PO TABS: 150.0000 mg | ORAL_TABLET | ORAL | 0 refills | Status: AC

## 2021-10-01 NOTE — Telephone Encounter (Signed)
Spoke with spouse today. ? ?They are coming in to see Dr. Ronnald Ramp today. ?

## 2021-10-01 NOTE — Telephone Encounter (Signed)
Spouse returning cma call, advised spouse there are no openings today for an appt that was offered according to spouse via vm by cma ? ?Spouse requesting cb ?

## 2021-10-01 NOTE — Telephone Encounter (Signed)
[facility] called to see if an order was given for [facility] nurse to treatment the infection in groin area.  ? ?She states that Dr. Ronnald Ramp could add to his progress note from today's visit and fax it to their office [phone] ?

## 2021-10-01 NOTE — Progress Notes (Addendum)
? ?Subjective:  ?Patient ID: Jeff Weaver, male    DOB: [birth year]  Age: 76 y.o. MRN: [id number] ? ?CC: Rash ? ? ?HPI ?Loran Senters presents for f/up - ? ?He complains of a 1 month history of itchy rash in his groin.  It has not improved with Augmentin or topical nystatin. ? ?Outpatient Medications Prior to Visit  ?Medication Sig Dispense Refill  ? acetaminophen (TYLENOL) 325 MG tablet Take 325-650 mg by mouth every 6 (six) hours as needed (pain/headaches.).    ? amLODipine (NORVASC) 5 MG tablet Take 1 tablet (5 mg total) by mouth daily. 90 tablet 3  ? cetirizine (ZYRTEC) 10 MG tablet Take 1 tablet (10 mg total) by mouth daily. (Patient taking differently: Take 10 mg by mouth in the morning.) 30 tablet 11  ? Cholecalciferol (VITAMIN D) 50 MCG (2000 UT) CAPS Take 2,000 Units by mouth in the morning.    ? Cholecalciferol 50 MCG (2000 UT) TABS Take by mouth.    ? clonazePAM (KLONOPIN) 1 MG disintegrating tablet Take 1 mg by mouth as directed. ALLOW 1 TABLET TO DISSOLVE IN CHEEK FOR SEIZURES LASTING LONGER THAN 4 MINUTES. OR AFTER 2ND SEIZURE IN 24 HOURS    ? desonide (DESOWEN) 0.05 % ointment Apply 1 application topically 2 (two) times daily as needed (skin irritation).    ? donepezil (ARICEPT) 10 MG tablet Take half a tablet in the morning for a month and then increase to 1 full tablet daily. 30 tablet 5  ? erythromycin ophthalmic ointment Place 1 application. into both eyes at bedtime. 3.5 g 0  ? furosemide (LASIX) 40 MG tablet TAKE 1 TABLET (40 MG TOTAL) BY MOUTH DAILY. (Patient taking differently: Take 20 mg by mouth in the morning.) 90 tablet 3  ? levETIRAcetam (KEPPRA) 500 MG tablet Take by mouth.    ? LORazepam (ATIVAN) 1 MG tablet Take 1 mg by mouth every 6 (six) hours as needed for seizure.     ? losartan (COZAAR) 25 MG tablet Take 1 tablet (25 mg total) by mouth daily. 90 tablet 3  ? metoprolol succinate (TOPROL-XL) 25 MG 24 hr tablet Take 1 tablet (25 mg total) by mouth daily. 90 tablet 3  ? Midazolam  (NAYZILAM) 5 MG/0.1ML SOLN Place 1 each into the nose as needed (seizure).    ? mirabegron ER (MYRBETRIQ) 50 MG TB24 tablet Take 1 tablet (50 mg total) by mouth daily. (Patient taking differently: Take 50 mg by mouth every evening.) 90 tablet 1  ? NON FORMULARY CPAP Machine: authorized by [facility] in [place]     ? nystatin (MYCOSTATIN/NYSTOP) powder Apply 1 application topically 3 (three) times daily. 60 g 3  ? nystatin ointment (MYCOSTATIN) Apply 1 application. topically 2 (two) times daily. 30 g 3  ? omeprazole (PRILOSEC) 40 MG capsule Take 1 capsule (40 mg total) by mouth daily as needed. 90 capsule 3  ? Propylene Glycol (SYSTANE BALANCE) 0.6 % SOLN Apply 1 drop to eye daily as needed (dry eyes).    ? pyridOXINE (VITAMIN B-6) 100 MG tablet Take 100 mg by mouth in the morning.    ? rosuvastatin (CRESTOR) 5 MG tablet TAKE 1 TABLET BY MOUTH THREE TIMES A WEEK AND INCREASE AS TOLERATED (Patient taking differently: Take 5 mg by mouth every Monday, Wednesday, and Friday. In the evening) 90 tablet 3  ? Tamsulosin HCl (FLOMAX) 0.4 MG CAPS Take 0.4 mg by mouth every evening.    ? thiamine 100 MG tablet Take 100  mg by mouth in the morning.    ? traZODone (DESYREL) 50 MG tablet Take 1 tablet by mouth at bedtime.    ? VIMPAT 200 MG TABS tablet Take 200 mg by mouth 2 (two) times daily.    ? vitamin B-12 (CYANOCOBALAMIN) 1000 MCG tablet Take 1,000 mcg by mouth in the morning.    ? amoxicillin-clavulanate (AUGMENTIN) 875-125 MG tablet Take 1 tablet by mouth 2 (two) times daily for 7 days. 14 tablet 0  ? levETIRAcetam (KEPPRA) 500 MG tablet Take 1,000-1,500 mg by mouth See admin instructions. Take 2 tablets (1000 mg) by mouth in the morning & take 3 tablets (1500 mg) by mouth in the evening    ? menthol-zinc oxide (GOLD BOND) powder Apply 1 application topically 2 (two) times daily as needed (itching/irritation).    ? miconazole (ZEASORB-AF) 2 % powder Apply 1 application topically as needed for itching.    ? tolnaftate (TINACTIN) 1 %  spray Apply topically 2 (two) times daily as needed (itching).    ? nitroGLYCERIN (NITROSTAT) 0.4 MG SL tablet PLACE 1 TABLET (0.4 MG TOTAL) UNDER THE TONGUE EVERY 5 (FIVE) MINUTES AS NEEDED FOR CHEST PAIN. (Patient taking differently: Place 0.4 mg under the tongue every 5 (five) minutes x 3 doses as needed for chest pain.) 25 tablet 7  ? ?No facility-administered medications prior to visit.  ? ? ?ROS ?Review of Systems  ?Constitutional:  Negative for chills and fever.  ?HENT: Negative.    ?Eyes: Negative.   ?Respiratory:  Negative for cough and shortness of breath.   ?Cardiovascular:  Negative for chest pain, palpitations and leg swelling.  ?Gastrointestinal:  Negative for abdominal pain, diarrhea and nausea.  ?Genitourinary: Negative.   ?Musculoskeletal: Negative.   ?Skin:  Positive for rash.  ?Neurological: Negative.   ?Hematological:  Negative for adenopathy. Does not bruise/bleed easily.  ?Psychiatric/Behavioral: Negative.    ? ?Objective:  ?BP (!) 138/92 (BP Location: Right Arm, Patient Position: Sitting, Cuff Size: Large)   Pulse 60   Temp 97.9 ?F (36.6 ?C) (Oral)   Ht 5' 8"  (1.727 m)   Wt 226 lb (102.5 kg)   SpO2 95%   BMI 34.36 kg/m?  ? ?BP Readings from Last 3 Encounters:  ?10/01/21 (!) 138/92  ?09/17/21 114/72  ?07/14/21 (!) 176/163  ? ? ?Wt Readings from Last 3 Encounters:  ?10/01/21 226 lb (102.5 kg)  ?09/17/21 221 lb (100.2 kg)  ?07/14/21 235 lb 14.3 oz (107 kg)  ? ? ?Physical Exam ?Vitals reviewed. Exam conducted with a chaperone present (his wife).  ?HENT:  ?   Nose: Nose normal.  ?   Mouth/Throat:  ?   Mouth: Mucous membranes are moist.  ?Eyes:  ?   General: No scleral icterus. ?   Conjunctiva/sclera: Conjunctivae normal.  ?Cardiovascular:  ?   Rate and Rhythm: Normal rate and regular rhythm.  ?   Heart sounds: No murmur heard. ?Pulmonary:  ?   Effort: Pulmonary effort is normal.  ?   Breath sounds: No stridor. No wheezing, rhonchi or rales.  ?Abdominal:  ?   General: Abdomen is protuberant.  Bowel sounds are normal. There is no distension.  ?   Palpations: Abdomen is soft. There is no mass.  ?   Hernia: There is no hernia in the left inguinal area or right inguinal area.  ?Genitourinary: ?   Penis: Normal and uncircumcised. No erythema, tenderness, discharge, swelling or lesions.   ?   Testes: Normal.  ?   Epididymis:  ?  Right: Normal.  ?   Left: Normal.  ? ? ?Musculoskeletal:     ?   General: Normal range of motion.  ?   Cervical back: Neck supple.  ?Lymphadenopathy:  ?   Cervical: No cervical adenopathy.  ?   Lower Body: No right inguinal adenopathy. No left inguinal adenopathy.  ?Skin: ?   General: Skin is warm.  ?   Findings: Rash present.  ?Neurological:  ?   Mental Status: He is alert. Mental status is at baseline.  ? ? ?Lab Results  ?Component Value Date  ? WBC 9.4 09/30/2021  ? HGB 12.6 (L) 09/30/2021  ? HCT 38.0 (L) 09/30/2021  ? PLT 216.0 09/30/2021  ? GLUCOSE 117 (H) 09/30/2021  ? CHOL 135 11/09/2020  ? TRIG 81 11/09/2020  ? HDL 46 11/09/2020  ? LDLDIRECT 87.6 09/19/2012  ? [place] 73 11/09/2020  ? ALT 14 09/30/2021  ? AST 11 09/30/2021  ? NA 138 09/30/2021  ? K 3.8 09/30/2021  ? CL 100 09/30/2021  ? CREATININE 0.77 09/30/2021  ? BUN 15 09/30/2021  ? CO2 32 09/30/2021  ? TSH 1.230 04/09/2021  ? INR 1.0 07/17/2017  ? HGBA1C 6.3 (H) 04/09/2021  ? MICROALBUR 1.0 11/13/2014  ? ? ?DG Chest 2 View ? ?Result Date: 07/14/2021 ?CLINICAL DATA:  Fall. EXAM: CHEST - 2 VIEW COMPARISON:  Chest x-ray dated June 14, 2021. FINDINGS: Unchanged left chest wall pacemaker. Unchanged mild cardiomegaly. Normal pulmonary vascularity. No focal consolidation, pleural effusion, or pneumothorax. Unchanged VP shunt tubing overlying the chest wall. IMPRESSION: 1. No acute cardiopulmonary disease. Electronically Signed   By: Titus Dubin M.D.   On: 07/14/2021 15:31  ? ?CT Head Wo Contrast ? ?Result Date: 07/14/2021 ?CLINICAL DATA:  Facial trauma, blunt; Head trauma, moderate-severe EXAM: CT HEAD WITHOUT CONTRAST CT  CERVICAL SPINE WITHOUT CONTRAST TECHNIQUE: Multidetector CT imaging of the head and cervical spine was performed following the standard protocol without intravenous contrast. Multiplanar CT image reconstructions o

## 2021-10-01 NOTE — Patient Instructions (Signed)
Jock Itch ?Jock itch (tinea cruris) is an infection of the skin in the groin area that is caused by a fungus. Jock itch causes an itchy rash in the groin and upper thigh area. It usually goes away in 2-3 weeks with treatment. ?What are the causes? ?The fungus that causes jock itch may be spread by: ?Touching a fungal infection elsewhere on your body, such as athlete's foot, and then touching your groin area. ?Sharing towels or clothing, such as socks or shoes, with someone who has a fungal infection. ?What increases the risk? ?Jock itch is most common in men and adolescent boys. You are also more likely to develop the condition if you: ?Are in a hot, humid climate. ?Wear tight-fitting clothing or wet bathing suits for long periods of time. ?Play sports. ?Are overweight. ?Have diabetes. ?Have a weakened body defense system (immune system). ?Sweat a lot. ?What are the signs or symptoms? ?Symptoms of jock itch may include: ?A red, pink, or brown rash in the groin area. Blisters may be present. The rash may spread to the thighs, the opening between the buttocks (anus), and the buttocks. ?Dry and scaly skin on or around the rash. ?Itchiness. ?How is this diagnosed? ?In most cases, your health care provider can make the diagnosis by looking at your rash. In some cases, a sample of infected skin may be scraped off. This sample may be examined under a microscope (biopsy) or by trying to grow the fungus from the sample (culture). ?How is this treated? ?Treatment for this condition may include: ?Antifungal medicine to kill the fungus. This may be a skin cream, ointment, or powder, or it may be a medicine that you take by mouth (orally). ?Skin cream or ointment to reduce itching. ?Lifestyle changes, such as wearing looser clothing and caring for your skin. ?Follow these instructions at home: ?Skin care ?Apply skin creams, ointments, or powders exactly as told by your health care provider. ?Wear loose-fitting clothing that does  not rub against your groin area. Men should wear boxer shorts or loose-fitting underwear. ?Keep your groin area clean and dry. ?Change your underwear every day. ?Change out of wet bathing suits as soon as possible. ?After bathing, use a separate towel to gently dry your groin area thoroughly. Using a separate towel will help prevent spreading the infection to other parts of your body. ?Avoid hot baths and showers. Hot water can make itching worse. ?Do not scratch the affected area. ?General instructions ?Take and apply over-the-counter and prescription medicines only as told by your health care provider. ?Do not share towels, clothing, or personal items with other people. ?Wash your hands often with soap and water for at least 20 seconds, especially after touching your groin area. If soap and water are not available, use hand sanitizer. ?When at the gym: ?Always wear shoes, especially in the shower and around the swimming pool. ?Keep any cuts covered. ?Disinfect any mats or equipment before using them. ?Shower immediately after working out. ?Keep all follow-up visits. This is important. ?Contact a health care provider if: ?Your rash: ?Gets worse or does not get better after 2 weeks of treatment. ?Spreads. ?Returns after treatment is finished. ?You have any of the following: ?A fever. ?New or worsening redness, swelling, or pain around your rash. ?Fluid, blood, or pus coming from your rash. ?Summary ?Jock itch (tinea cruris) is a fungal infection of the skin in the groin area. ?The fungus can be spread by sharing clothing or by touching a fungus infection  elsewhere on your body and then touching your groin area. ?Treatment may include antifungal medicine and lifestyle changes, such as keeping the area clean and dry. ?This information is not intended to replace advice given to you by your health care provider. Make sure you discuss any questions you have with your health care provider. ?Document Revised: 09/08/2020  Document Reviewed: 09/08/2020 ?Elsevier Patient Education ? [place]. ? ?

## 2021-10-06 DIAGNOSIS — G9608 Other cranial cerebrospinal fluid leak: Secondary | ICD-10-CM | POA: Diagnosis not present

## 2021-10-06 DIAGNOSIS — Z982 Presence of cerebrospinal fluid drainage device: Secondary | ICD-10-CM | POA: Diagnosis not present

## 2021-10-07 DIAGNOSIS — Z8673 Personal history of transient ischemic attack (TIA), and cerebral infarction without residual deficits: Secondary | ICD-10-CM | POA: Diagnosis not present

## 2021-10-07 DIAGNOSIS — Z79899 Other long term (current) drug therapy: Secondary | ICD-10-CM | POA: Diagnosis not present

## 2021-10-07 DIAGNOSIS — Z982 Presence of cerebrospinal fluid drainage device: Secondary | ICD-10-CM | POA: Diagnosis not present

## 2021-10-07 DIAGNOSIS — F0280 Dementia in other diseases classified elsewhere without behavioral disturbance: Secondary | ICD-10-CM | POA: Diagnosis not present

## 2021-10-07 DIAGNOSIS — G3183 Dementia with Lewy bodies: Secondary | ICD-10-CM | POA: Diagnosis not present

## 2021-10-07 DIAGNOSIS — G40119 Localization-related (focal) (partial) symptomatic epilepsy and epileptic syndromes with simple partial seizures, intractable, without status epilepticus: Secondary | ICD-10-CM | POA: Diagnosis not present

## 2021-10-07 DIAGNOSIS — Z8679 Personal history of other diseases of the circulatory system: Secondary | ICD-10-CM | POA: Diagnosis not present

## 2021-10-07 DIAGNOSIS — R4789 Other speech disturbances: Secondary | ICD-10-CM | POA: Diagnosis not present

## 2021-10-07 DIAGNOSIS — R253 Fasciculation: Secondary | ICD-10-CM | POA: Diagnosis not present

## 2021-10-08 ENCOUNTER — Other Ambulatory Visit: Payer: Self-pay

## 2021-10-08 ENCOUNTER — Emergency Department (INDEPENDENT_AMBULATORY_CARE_PROVIDER_SITE_OTHER)
Admission: RE | Admit: 2021-10-08 | Discharge: 2021-10-08 | Disposition: A | Discharge status: Home / Self Care | Payer: Medicare Other | Source: Ambulatory Visit

## 2021-10-08 VITALS — BP 118/75 | HR 60 | Temp 98.9°F | Resp 16 | Ht 68.0 in | Wt 218.0 lb

## 2021-10-08 DIAGNOSIS — F0280 Dementia in other diseases classified elsewhere without behavioral disturbance: Secondary | ICD-10-CM | POA: Diagnosis not present

## 2021-10-08 DIAGNOSIS — S065X0D Traumatic subdural hemorrhage without loss of consciousness, subsequent encounter: Secondary | ICD-10-CM | POA: Diagnosis not present

## 2021-10-08 DIAGNOSIS — R350 Frequency of micturition: Secondary | ICD-10-CM | POA: Diagnosis not present

## 2021-10-08 DIAGNOSIS — R3589 Other polyuria: Secondary | ICD-10-CM | POA: Diagnosis not present

## 2021-10-08 DIAGNOSIS — N3001 Acute cystitis with hematuria: Secondary | ICD-10-CM | POA: Diagnosis not present

## 2021-10-08 DIAGNOSIS — I10 Essential (primary) hypertension: Secondary | ICD-10-CM | POA: Diagnosis not present

## 2021-10-08 DIAGNOSIS — S2231XD Fracture of one rib, right side, subsequent encounter for fracture with routine healing: Secondary | ICD-10-CM | POA: Diagnosis not present

## 2021-10-08 DIAGNOSIS — S72001D Fracture of unspecified part of neck of right femur, subsequent encounter for closed fracture with routine healing: Secondary | ICD-10-CM | POA: Diagnosis not present

## 2021-10-08 DIAGNOSIS — G3183 Dementia with Lewy bodies: Secondary | ICD-10-CM | POA: Diagnosis not present

## 2021-10-08 LAB — POCT URINALYSIS DIP (MANUAL ENTRY)
Bilirubin, UA: NEGATIVE
Glucose, UA: NEGATIVE mg/dL
Ketones, POC UA: NEGATIVE mg/dL
Nitrite, UA: NEGATIVE
Protein Ur, POC: NEGATIVE mg/dL
Spec Grav, UA: 1.015 (ref 1.010–1.025)
Urobilinogen, UA: 0.2 E.U./dL
pH, UA: 5.5 (ref 5.0–8.0)

## 2021-10-08 MED ORDER — BENZONATATE 200 MG PO CAPS: 200.0000 mg | ORAL_CAPSULE | Freq: Three times a day (TID) | ORAL | 0 refills | Status: DC | PRN

## 2021-10-08 MED ORDER — CIPROFLOXACIN HCL 250 MG PO TABS: 250.0000 mg | ORAL_TABLET | Freq: Two times a day (BID) | ORAL | 0 refills | Status: DC

## 2021-10-08 NOTE — ED Triage Notes (Addendum)
Polyuria started last night ?

## 2021-10-08 NOTE — ED Provider Notes (Signed)
?[facility] ? ? ? ?CSN: [id number] ?Arrival date & time: 10/08/21  1249 ? ? ?  ? ?History   ?Chief Complaint ?Chief Complaint  ?Patient presents with  ? Urinary Frequency  ?  Suspected Unresolved UTI - Entered by patient  ? Polyuria  ? ? ?HPI ?Jeff Weaver is a 76 y.o. male.  ? ?HPI 76 year old male presents with urinary frequency for 1 night.  PMH significant for cerebral aneurysm and chronic CHF.  Patient is accompanied by his wife this afternoon.  Patient's wife request Tessalon Perles for mild cough. ? ?Past Medical History:  ?Diagnosis Date  ? Aortic insufficiency   ? a. mild 2017 echo.  ? Atypical chest pain   ? Cerebral aneurysm   ? a. s/p clipping at [facility] in 8/13 c/b short-term memory loss, cerebral hemorrhage and seizure disorder s/p VP shunt.  ? Cerebral hemorrhage ([place])   ? Chronic diastolic CHF (congestive heart failure) ([place])   ? CVA (cerebral infarction)   ? Diverticulosis   ? Enteritis (regional)   ? Dr Olevia Perches  ? First degree AV block   ? GI bleed   ? HTN (hypertension)   ? Hyperlipidemia   ? Hypertensive heart disease   ? IBS (irritable bowel syndrome)   ? Mild CAD   ? Cardiac cath 01/12/18 showed minimal nonobstructive CAD, normal LVEF, normal LVEDP.  ? NSVT (nonsustained ventricular tachycardia) ([place])   ? Orthostatic hypotension   ? Pacemaker   ? Pre-diabetes   ? PVC's (premature ventricular contractions)   ? 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 Rheumatoid arthritis(714.0)   ? dxed in [employer] in 1980s  ? Second degree AV block, Mobitz type I   ? Seizures ([place])   ? last April 26,2021  ? Sinus bradycardia   ? Sleep apnea   ? SSS (sick sinus syndrome) ([place])   ? a. s/p PPM 07/02/18.  ? ? ?Patient Active Problem List  ? Diagnosis Date Noted  ? [place] rash of groin 10/01/2021  ? Allergic contact dermatitis due to drugs in contact with skin 10/01/2021  ? HFrEF (heart failure with reduced ejection fraction) ([place]) 09/18/2021  ? Blepharitis of upper and lower eyelids of both  eyes 09/18/2021  ? Tinea cruris 09/18/2021  ? Lewy body dementia ([place]) 09/16/2021  ? Closed fracture of right hip ([place]) 09/16/2021  ? S/P hip replacement, right  07/2021 09/16/2021  ? Vascular dementia ([place]) 04/29/2021  ? Cough 04/09/2021  ? Vitamin D deficiency 04/09/2021  ? Vitamin B1 deficiency 04/09/2021  ? Right arm pain 01/06/2021  ? Head injury 11/11/2020  ? Abrasion of leg with infection, left, initial encounter 10/28/2020  ? Seizures ([place])   ? Muscle cramping 12/30/2019  ? Dizziness 12/30/2019  ? Unsteady gait 12/30/2019  ? Pacemaker 11/27/2019  ? Fatigue 07/09/2019  ? SSS (sick sinus syndrome) ([place]) 07/02/2018  ? Bilateral shoulder pain 06/14/2018  ? Upper airway cough syndrome vs cough variant asthma 03/20/2018  ? Acute midline low back pain without sciatica 12/25/2017  ? Depression 10/17/2017  ? CAD (coronary artery disease), hx of NSTEMI 08/15/2017  ? Chest pain 07/17/2017  ? SOBOE (shortness of breath on exertion) 06/08/2017  ? Bilateral sensorineural hearing loss 06/01/2017  ? Nasal turbinate hypertrophy 06/01/2017  ? Dysphagia 05/31/2017  ? Osteoporosis 01/30/2017  ? History of cerebral hemorrhage 12/28/2016  ? Seborrheic dermatitis 07/25/2016  ? Cognitive communication deficit 06/28/2016  ? Hypertensive heart disease 06/28/2016  ? Vitamin B 12  deficiency 03/16/2016  ? Memory difficulties 03/13/2016  ? Confusion 03/13/2016  ? VP (ventriculoperitoneal) shunt status 02/12/2016  ? Orthostatic hypotension 01/05/2016  ? Benign prostatic hyperplasia with urinary obstruction 08/04/2014  ? Urinary urgency 08/04/2014  ? Gastroesophageal reflux disease 07/14/2014  ? Polymyalgia rheumatica ([place]) 05/28/2014  ? 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/2014  ? SBE (subacute bacterial endocarditis) prophylaxis candidate 11/24/2013  ? Aphasia 07/17/2013  ? 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 seizures, not intractable, without status epilepticus ([place]) 07/17/2013  ? Subdural hemorrhage  ([place]) 07/17/2013  ? 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[place]) 07/17/2013  ? Pre-diabetes 03/26/2013  ? Increased frequency of urination 10/31/2012  ? Nocturia 10/31/2012  ? History of cerebral aneurysm repair 10/18/2012  ? History of stroke without residual deficits 10/18/2012  ? Blepharitis of both eyes 09/27/2012  ? Ocular rosacea 09/27/2012  ? Seizure disorder ([place]) 07/23/2012  ? Entropion 07/10/2012  ? Hyperopia with astigmatism 07/10/2012  ? Combined senile cataract 04/13/2012  ? Presbyopia 04/13/2012  ? 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 artery ([place]) 03/01/2012  ? Cognitive safety issue 02/20/2012  ? Nonruptured cerebral aneurysm 11/23/2011  ? Obstructive sleep apnea syndrome 08/09/2011  ? Hyperlipidemia 02/03/2009  ? HTN (hypertension) 02/03/2009  ? Osteoarthritis 02/03/2009  ? History of Crohn's disease 04/17/2008  ? ? ?Past Surgical History:  ?Procedure Laterality Date  ? BIV PACEMAKER INSERTION CRT-P N/A 03/10/2021  ? Procedure: BIV PACEMAKER INSERTION CRT-P UPGRADE;  Surgeon: Constance Haw, MD;  Location: [facility];  Service: Cardiovascular;  Laterality: N/A;  ? CHOLECYSTECTOMY N/A 01/07/2016  ? Procedure: LAPAROSCOPIC CHOLECYSTECTOMY ;  Surgeon: Coralie Keens, MD;  Location: [facility];  Service: General;  Laterality: N/A;  ? cns shunt  02/23/12  ? COLONOSCOPY W/ POLYPECTOMY  1978  ? negative 2009, Dr Olevia Perches. Due 2014  ? CRANIOTOMY  02/02/12  ? Dr Harvel Ricks, [place] of aneurysm  ? CRANIOTOMY  02-02-12  ? left pterional craniotomy for clipping complex anterior communicating artery aneurysm   ? HERNIA REPAIR    ? LEFT HEART CATH AND CORONARY ANGIOGRAPHY N/A 01/12/2018  ? Procedure: LEFT HEART CATH AND CORONARY ANGIOGRAPHY;  Surgeon: Martinique, Peter M, MD;  Location: [facility];  Service: Cardiovascular;  Laterality: N/A;  ? LEFT HEART CATHETERIZATION WITH CORONARY ANGIOGRAM N/A 11/26/2013  ? Procedure: LEFT HEART CATHETERIZATION WITH CORONARY ANGIOGRAM;  Surgeon: [name], MD;  Location[facility];  Service: Cardiovascular;  Laterality: N/A;  ? NOSE SURGERY    ? PACEMAKER IMPLANT N/A 07/02/2018  ? Procedure: PACEMAKER IMPLANT;  Surgeon: Constance Haw, MD;  Location: [facility];  Service: Cardiovascular;  Laterality: N/A;  ? [place]  ? TONSILLECTOMY AND ADENOIDECTOMY    ? VENTRICULOPERITONEAL SHUNT  02-23-12  ? INSERTION OF RIGHT FRONTAL VENTRICULOPERITONEAL SHUNT WITH A CODMAN HAKIM PROGRAMMABLE VALVE  ? ? ? ? ? ?Home Medications   ? ?Prior to Admission medications   ?Medication Sig Start Date End Date Taking? Authorizing Provider  ?benzonatate (TESSALON) 200 MG capsule Take 1 capsule (200 mg total) by mouth 3 (three) times daily as needed for up to 7 days for cough. 10/08/21 10/15/21 Yes Eliezer Lofts, FNP  ?ciprofloxacin (CIPRO) 250 MG tablet Take 1 tablet (250 mg total) by mouth 2 (two) times daily for 7 days. 10/08/21 10/15/21 Yes Eliezer Lofts, FNP  ?acetaminophen (TYLENOL) 325 MG tablet Take 325-650 mg by mouth every 6 (six) hours as needed (pain/headaches.).    [provider]  ?  amLODipine (NORVASC) 5 MG tablet Take 1 tablet (5 mg total) by mouth daily. 09/17/21   [name], MD  ?cetirizine (ZYRTEC) 10 MG tablet Take 1 tablet (10 mg total) by mouth daily. ?Patient taking differently: Take 10 mg by mouth in the morning. 10/26/15   [name], MD  ?Cholecalciferol (VITAMIN D) 50 MCG (2000 UT) CAPS Take 2,000 Units by mouth in the morning.    [provider]  ?Cholecalciferol 50 MCG (2000 UT) TABS Take by mouth.    [provider]  ?clonazePAM (KLONOPIN) 1 MG disintegrating tablet Take 1 mg by mouth as directed. ALLOW 1 TABLET TO DISSOLVE IN CHEEK FOR SEIZURES LASTING LONGER THAN 4 MINUTES. OR AFTER 2ND SEIZURE IN 24 HOURS 11/05/19 01/16/23  [provider]  ?desonide (DESOWEN) 0.05 % ointment Apply 1 application topically 2 (two) times daily as needed (skin irritation).    [provider]  ?donepezil (ARICEPT) 10 MG tablet  Take half a tablet in the morning for a month and then increase to 1 full tablet daily. 05/06/21     ?erythromycin ophthalmic ointment Place 1 application. into both eyes at bedtime. 09/17/21   Billey Gosling

## 2021-10-08 NOTE — Discharge Instructions (Addendum)
Advised patient to take medication as directed with food to completion.  Encouraged patient to increase daily water intake while taking this medication.  Advised patient we will follow-up with urine culture results once received.  Advised patient if symptoms worsen and/or unresolved please follow-up with PCP, Urology or here for further evaluation. ?

## 2021-10-12 ENCOUNTER — Encounter: Payer: Self-pay | Admitting: Internal Medicine

## 2021-10-12 ENCOUNTER — Telehealth: Payer: Self-pay | Admitting: Internal Medicine

## 2021-10-12 DIAGNOSIS — G3183 Dementia with Lewy bodies: Secondary | ICD-10-CM | POA: Diagnosis not present

## 2021-10-12 DIAGNOSIS — S72001D Fracture of unspecified part of neck of right femur, subsequent encounter for closed fracture with routine healing: Secondary | ICD-10-CM | POA: Diagnosis not present

## 2021-10-12 DIAGNOSIS — F0280 Dementia in other diseases classified elsewhere without behavioral disturbance: Secondary | ICD-10-CM | POA: Diagnosis not present

## 2021-10-12 DIAGNOSIS — S065X0D Traumatic subdural hemorrhage without loss of consciousness, subsequent encounter: Secondary | ICD-10-CM | POA: Diagnosis not present

## 2021-10-12 DIAGNOSIS — I10 Essential (primary) hypertension: Secondary | ICD-10-CM | POA: Diagnosis not present

## 2021-10-12 DIAGNOSIS — S2231XD Fracture of one rib, right side, subsequent encounter for fracture with routine healing: Secondary | ICD-10-CM | POA: Diagnosis not present

## 2021-10-12 NOTE — Progress Notes (Signed)
? ? ?Subjective:  ? ? Patient ID: Jeff Weaver, male    DOB: 27-Jun-1946, 76 y.o.   MRN: [id number] ? ?This visit occurred during the SARS-CoV-2 public health emergency.  Safety protocols were in place, including screening questions prior to the visit, additional usage of staff PPE, and extensive cleaning of exam room while observing appropriate contact time as indicated for disinfecting solutions. ? ? ? ?HPI ?Jeff Weaver is here for  ?Chief Complaint  ?Patient presents with  ? Urinary Tract Infection  ? ?He is here with his wife and daughter.  They provide most of the history due to his dementia.  He is a poor historian. ? ?Unresolved UTI/ infection -he had a urinary tract infection last month and his symptoms improved on antibiotics.  He went to urgent care for frequent urination and polyuria and UA showed possible infection.  Culture was not sent.  He was prescribed Cipro 250 twice daily x7 days and is still taking it.  His symptoms have improved.  Besides urine symptoms he also has intermittent confusion.  His daughter and wife note that when he does go on an antibiotic and they think he has a UTI that confusion does not improve. ? ? ?He sometimes urinates a lot and sometimes states he does not urinate much.  He does feel like he goes frequently.  He is unsure if he empties his bladder or not.  He does intermittently have increased urination and dysuria.  The confusion is intermittent. ? ? ?Right lower back pain-sometimes pain radiates down the right leg, but sometimes he has pain in his left upper leg.  The pain in his back is worse when he sits back in a chair.  He does have difficulty getting up.  That sometimes make this the pain worse. ? ? ?Medications and allergies reviewed with patient and updated if appropriate. ? ?Current Outpatient Medications on File Prior to Visit  ?Medication Sig Dispense Refill  ? acetaminophen (TYLENOL) 325 MG tablet Take 325-650 mg by mouth every 6 (six) hours as needed (pain/headaches.).     ? amLODipine (NORVASC) 5 MG tablet Take 1 tablet (5 mg total) by mouth daily. 90 tablet 3  ? benzonatate (TESSALON) 200 MG capsule Take 1 capsule (200 mg total) by mouth 3 (three) times daily as needed for up to 7 days for cough. 40 capsule 0  ? cetirizine (ZYRTEC) 10 MG tablet Take 1 tablet (10 mg total) by mouth daily. (Patient taking differently: Take 10 mg by mouth in the morning.) 30 tablet 11  ? Cholecalciferol (VITAMIN D) 50 MCG (2000 UT) CAPS Take 2,000 Units by mouth in the morning.    ? Cholecalciferol 50 MCG (2000 UT) TABS Take by mouth.    ? ciprofloxacin (CIPRO) 250 MG tablet Take 1 tablet (250 mg total) by mouth 2 (two) times daily for 7 days. 14 tablet 0  ? clonazePAM (KLONOPIN) 1 MG disintegrating tablet Take 1 mg by mouth as directed. ALLOW 1 TABLET TO DISSOLVE IN CHEEK FOR SEIZURES LASTING LONGER THAN 4 MINUTES. OR AFTER 2ND SEIZURE IN 24 HOURS    ? desonide (DESOWEN) 0.05 % ointment Apply 1 application topically 2 (two) times daily as needed (skin irritation).    ? donepezil (ARICEPT) 10 MG tablet Take half a tablet in the morning for a month and then increase to 1 full tablet daily. 30 tablet 5  ? erythromycin ophthalmic ointment Place 1 application. into both eyes at bedtime. 3.5 g 0  ? fluconazole (  DIFLUCAN) 150 MG tablet Take 1 tablet (150 mg total) by mouth once a week. 4 tablet 0  ? furosemide (LASIX) 40 MG tablet TAKE 1 TABLET (40 MG TOTAL) BY MOUTH DAILY. (Patient taking differently: Take 20 mg by mouth in the morning.) 90 tablet 3  ? levETIRAcetam (KEPPRA) 500 MG tablet Take 500 mg by mouth. Patient is taking 1750 mg in the morning (3 1/2 500 mg tablets) and 1750 mg in the evening (3 1/2  500 mg tablets)    ? LORazepam (ATIVAN) 1 MG tablet Take 1 mg by mouth every 6 (six) hours as needed for seizure.     ? losartan (COZAAR) 25 MG tablet Take 1 tablet (25 mg total) by mouth daily. 90 tablet 3  ? metoprolol succinate (TOPROL-XL) 25 MG 24 hr tablet Take 1 tablet (25 mg total) by mouth  daily. 90 tablet 3  ? Midazolam (NAYZILAM) 5 MG/0.1ML SOLN Place 1 each into the nose as needed (seizure).    ? mirabegron ER (MYRBETRIQ) 50 MG TB24 tablet Take 1 tablet (50 mg total) by mouth daily. (Patient taking differently: Take 50 mg by mouth every evening.) 90 tablet 1  ? NON FORMULARY CPAP Machine: authorized by [facility] in [place]     ? nystatin (MYCOSTATIN/NYSTOP) powder Apply 1 application topically 3 (three) times daily. 60 g 3  ? nystatin ointment (MYCOSTATIN) Apply 1 application. topically 2 (two) times daily. 30 g 3  ? omeprazole (PRILOSEC) 40 MG capsule Take 1 capsule (40 mg total) by mouth daily as needed. 90 capsule 3  ? Propylene Glycol (SYSTANE BALANCE) 0.6 % SOLN Apply 1 drop to eye daily as needed (dry eyes).    ? pyridOXINE (VITAMIN B-6) 100 MG tablet Take 100 mg by mouth in the morning.    ? rosuvastatin (CRESTOR) 5 MG tablet TAKE 1 TABLET BY MOUTH THREE TIMES A WEEK AND INCREASE AS TOLERATED (Patient taking differently: Take 5 mg by mouth every Monday, Wednesday, and Friday. In the evening) 90 tablet 3  ? Tamsulosin HCl (FLOMAX) 0.4 MG CAPS Take 0.4 mg by mouth every evening.    ? thiamine 100 MG tablet Take 100 mg by mouth in the morning.    ? traZODone (DESYREL) 50 MG tablet Take 1 tablet by mouth at bedtime.    ? triamcinolone cream (KENALOG) 0.5 % Apply 1 application. topically 3 (three) times daily. 30 g 1  ? VIMPAT 200 MG TABS tablet Take 200 mg by mouth 2 (two) times daily.    ? vitamin B-12 (CYANOCOBALAMIN) 1000 MCG tablet Take 1,000 mcg by mouth in the morning.    ? nitroGLYCERIN (NITROSTAT) 0.4 MG SL tablet PLACE 1 TABLET (0.4 MG TOTAL) UNDER THE TONGUE EVERY 5 (FIVE) MINUTES AS NEEDED FOR CHEST PAIN. (Patient taking differently: Place 0.4 mg under the tongue every 5 (five) minutes x 3 doses as needed for chest pain.) 25 tablet 7  ? ?No current facility-administered medications on file prior to visit.  ? ? ?Review of Systems  ?Constitutional:  Negative for fever.  ?HENT:  Positive for  congestion (mild - clear). Negative for sinus pain and sore throat.   ?Respiratory:  Positive for cough (clear mucus). Negative for shortness of breath and wheezing.   ?Cardiovascular:  Negative for chest pain, palpitations and leg swelling.  ?Gastrointestinal:  Negative for abdominal pain and nausea.  ?Genitourinary:  Positive for dysuria (Not currently, but sometimes), frequency and urgency. Negative for hematuria.  ?Musculoskeletal:  Positive for back pain.  ?  Neurological:  Positive for headaches. Negative for light-headedness.  ? ?   ?Objective:  ? ?Vitals:  ? 10/13/21 1516  ?BP: 128/78  ?Pulse: 60  ?Temp: 98.4 ?F (36.9 ?C)  ?SpO2: 97%  ? ?BP Readings from Last 3 Encounters:  ?10/13/21 128/78  ?10/08/21 118/75  ?10/01/21 (!) 138/92  ? ?Wt Readings from Last 3 Encounters:  ?10/13/21 218 lb (98.9 kg)  ?10/08/21 218 lb (98.9 kg)  ?10/01/21 226 lb (102.5 kg)  ? ?Body mass index is 33.15 kg/m?. ? ?  ?Physical Exam ?Constitutional:   ?   General: He is not in acute distress. ?   Appearance: Normal appearance. He is not ill-appearing.  ?HENT:  ?   Head: Normocephalic and atraumatic.  ?Eyes:  ?   Conjunctiva/sclera: Conjunctivae normal.  ?Cardiovascular:  ?   Rate and Rhythm: Normal rate and regular rhythm.  ?   Heart sounds: Normal heart sounds. No murmur heard. ?Pulmonary:  ?   Effort: Pulmonary effort is normal. No respiratory distress.  ?   Breath sounds: Normal breath sounds. No wheezing or rales.  ?Abdominal:  ?   General: There is no distension.  ?   Palpations: Abdomen is soft.  ?   Tenderness: There is no abdominal tenderness. There is no guarding or rebound.  ?Musculoskeletal:     ?   General: No tenderness (No tenderness with palpation lumbar spine or right lower back).  ?   Cervical back: Neck supple. No tenderness.  ?   Right lower leg: No edema.  ?   Left lower leg: No edema.  ?Lymphadenopathy:  ?   Cervical: No cervical adenopathy.  ?Skin: ?   General: Skin is warm and dry.  ?   Findings: No rash.   ?Neurological:  ?   Mental Status: He is alert. Mental status is at baseline.  ?Psychiatric:     ?   Mood and Affect: Mood normal.  ? ?   ? ? ? ? ? ?Assessment & Plan:  ? ? ?See Problem List for Assessment and P

## 2021-10-12 NOTE — Telephone Encounter (Signed)
Connected to [facility] [employer] 4.7.2023. ? ?Jeff Weaver with [facility] OT- pt has ?increased confusion after UTI treatment. Last Friday seen after. Urine not rechecked. Not his usual behavior. VSS. Yesterday change in behavior. No fever. ? ? ?Advised to go to ED,  ?

## 2021-10-13 ENCOUNTER — Ambulatory Visit (INDEPENDENT_AMBULATORY_CARE_PROVIDER_SITE_OTHER): Payer: Medicare Other

## 2021-10-13 ENCOUNTER — Ambulatory Visit (INDEPENDENT_AMBULATORY_CARE_PROVIDER_SITE_OTHER): Payer: Medicare Other | Admitting: Internal Medicine

## 2021-10-13 VITALS — BP 128/78 | HR 60 | Temp 98.4°F | Ht 68.0 in | Wt 218.0 lb

## 2021-10-13 DIAGNOSIS — N401 Enlarged prostate with lower urinary tract symptoms: Secondary | ICD-10-CM | POA: Diagnosis not present

## 2021-10-13 DIAGNOSIS — M8580 Other specified disorders of bone density and structure, unspecified site: Secondary | ICD-10-CM | POA: Diagnosis not present

## 2021-10-13 DIAGNOSIS — G40909 Epilepsy, unspecified, not intractable, without status epilepticus: Secondary | ICD-10-CM | POA: Diagnosis not present

## 2021-10-13 DIAGNOSIS — R059 Cough, unspecified: Secondary | ICD-10-CM | POA: Diagnosis not present

## 2021-10-13 DIAGNOSIS — L304 Erythema intertrigo: Secondary | ICD-10-CM | POA: Diagnosis not present

## 2021-10-13 DIAGNOSIS — R052 Subacute cough: Secondary | ICD-10-CM

## 2021-10-13 DIAGNOSIS — I10 Essential (primary) hypertension: Secondary | ICD-10-CM | POA: Diagnosis not present

## 2021-10-13 DIAGNOSIS — M5134 Other intervertebral disc degeneration, thoracic region: Secondary | ICD-10-CM | POA: Diagnosis not present

## 2021-10-13 DIAGNOSIS — G3183 Dementia with Lewy bodies: Secondary | ICD-10-CM | POA: Diagnosis not present

## 2021-10-13 DIAGNOSIS — M5136 Other intervertebral disc degeneration, lumbar region: Secondary | ICD-10-CM | POA: Diagnosis not present

## 2021-10-13 DIAGNOSIS — E785 Hyperlipidemia, unspecified: Secondary | ICD-10-CM | POA: Diagnosis not present

## 2021-10-13 DIAGNOSIS — G4733 Obstructive sleep apnea (adult) (pediatric): Secondary | ICD-10-CM | POA: Diagnosis not present

## 2021-10-13 DIAGNOSIS — L233 Allergic contact dermatitis due to drugs in contact with skin: Secondary | ICD-10-CM | POA: Diagnosis not present

## 2021-10-13 DIAGNOSIS — Z7901 Long term (current) use of anticoagulants: Secondary | ICD-10-CM | POA: Diagnosis not present

## 2021-10-13 DIAGNOSIS — Z9841 Cataract extraction status, right eye: Secondary | ICD-10-CM | POA: Diagnosis not present

## 2021-10-13 DIAGNOSIS — N138 Other obstructive and reflux uropathy: Secondary | ICD-10-CM

## 2021-10-13 DIAGNOSIS — Z87891 Personal history of nicotine dependence: Secondary | ICD-10-CM | POA: Diagnosis not present

## 2021-10-13 DIAGNOSIS — N411 Chronic prostatitis: Secondary | ICD-10-CM | POA: Diagnosis not present

## 2021-10-13 DIAGNOSIS — H1033 Unspecified acute conjunctivitis, bilateral: Secondary | ICD-10-CM | POA: Diagnosis not present

## 2021-10-13 DIAGNOSIS — J309 Allergic rhinitis, unspecified: Secondary | ICD-10-CM | POA: Diagnosis not present

## 2021-10-13 DIAGNOSIS — Z8679 Personal history of other diseases of the circulatory system: Secondary | ICD-10-CM | POA: Diagnosis not present

## 2021-10-13 DIAGNOSIS — S2231XD Fracture of one rib, right side, subsequent encounter for fracture with routine healing: Secondary | ICD-10-CM | POA: Diagnosis not present

## 2021-10-13 DIAGNOSIS — S065X0D Traumatic subdural hemorrhage without loss of consciousness, subsequent encounter: Secondary | ICD-10-CM | POA: Diagnosis not present

## 2021-10-13 DIAGNOSIS — B309 Viral conjunctivitis, unspecified: Secondary | ICD-10-CM | POA: Diagnosis not present

## 2021-10-13 DIAGNOSIS — S72001D Fracture of unspecified part of neck of right femur, subsequent encounter for closed fracture with routine healing: Secondary | ICD-10-CM | POA: Diagnosis not present

## 2021-10-13 DIAGNOSIS — Z9181 History of falling: Secondary | ICD-10-CM | POA: Diagnosis not present

## 2021-10-13 DIAGNOSIS — Z9889 Other specified postprocedural states: Secondary | ICD-10-CM | POA: Diagnosis not present

## 2021-10-13 DIAGNOSIS — K5900 Constipation, unspecified: Secondary | ICD-10-CM | POA: Diagnosis not present

## 2021-10-13 DIAGNOSIS — R410 Disorientation, unspecified: Secondary | ICD-10-CM | POA: Diagnosis not present

## 2021-10-13 DIAGNOSIS — F0280 Dementia in other diseases classified elsewhere without behavioral disturbance: Secondary | ICD-10-CM | POA: Diagnosis not present

## 2021-10-13 DIAGNOSIS — G4700 Insomnia, unspecified: Secondary | ICD-10-CM | POA: Diagnosis not present

## 2021-10-13 DIAGNOSIS — Z96641 Presence of right artificial hip joint: Secondary | ICD-10-CM | POA: Diagnosis not present

## 2021-10-13 DIAGNOSIS — Z9842 Cataract extraction status, left eye: Secondary | ICD-10-CM | POA: Diagnosis not present

## 2021-10-13 MED ORDER — TRIAMCINOLONE ACETONIDE 0.5 % EX CREA: 1.0000 "application " | TOPICAL_CREAM | Freq: Three times a day (TID) | CUTANEOUS | 1 refills | Status: DC

## 2021-10-13 MED ORDER — CIPROFLOXACIN HCL 500 MG PO TABS: 500.0000 mg | ORAL_TABLET | Freq: Two times a day (BID) | ORAL | 0 refills | Status: AC

## 2021-10-13 NOTE — Assessment & Plan Note (Signed)
Chronic ?Taking Flomax 0.4 mg daily ?He is also on Myrbetriq 50 mg daily for his bladder ?I do wonder if his BPH is well controlled-it is very difficult to know from his history ?Will refer to urology for further evaluation since this could be causing a lot of his symptoms and increasing his risk of possible infection ?

## 2021-10-13 NOTE — Assessment & Plan Note (Signed)
Intermittent ?Very difficult to say what his confusion is from ?At times it seems to be related to urinary tract infection-tends to improve with antibiotics ?No other source of infection?  Possible chronic prostatitis ?He also has Lewy body dementia and that could be contributing, but family that is with him all the time notes distinction with before and after antibiotics ?Check CBC, CMP, UA, urine culture, chest x-ray today ?Will refer to urology for further evaluation of recurrent UTIs, BPH, chronic prostatitis ?

## 2021-10-13 NOTE — Patient Instructions (Addendum)
? ? ? ?  Blood work was ordered.  Chest xray ordered.  ? ? ?Medications changes include :   change the Cipro dose to 500 mg twice daily for 6 weeks.  ? ? ?Your prescription(s) have been sent to your pharmacy.  ? ? ?

## 2021-10-13 NOTE — Assessment & Plan Note (Signed)
Chronic ?Groin area ?Improved with triamcinolone cream-continue as needed-advised not to use too long if possible ?Also has had yeast component to this in the past and may need to intermittently use antifungal cream ?Triamcinolone cream refilled ?Okay to take fluconazole prescription ?

## 2021-10-13 NOTE — Assessment & Plan Note (Signed)
I wonder if some of his symptoms are related to a chronic prostatitis ?He seems to have these recurrent UTIs, although his last culture here was not impressive for a infection, but his symptoms did improve with an antibiotic ?His symptoms seem to be recurring and they do cause confusion ?Possible prostatitis ?Change current Cipro to 500 mg twice daily x6 weeks ?Will refer to urology-need their input and guidance with treatment ?Check UA, urine culture today ?

## 2021-10-13 NOTE — Assessment & Plan Note (Signed)
Subacute-chronic ?Has a cough and occasionally brings up some clear mucus ?I do not think there is an infection ?Possibly related to postnasal drip, possible GERD ?He is now sleeping at an incline angle, which should help either ?We will check a chest x-ray to make sure there is no infection and that will be symptomatic treatment, allergy medications, trying to make sure his GERD is well controlled ?

## 2021-10-14 DIAGNOSIS — H532 Diplopia: Secondary | ICD-10-CM | POA: Diagnosis not present

## 2021-10-14 DIAGNOSIS — H5203 Hypermetropia, bilateral: Secondary | ICD-10-CM | POA: Diagnosis not present

## 2021-10-14 LAB — URINALYSIS, ROUTINE W REFLEX MICROSCOPIC
Bilirubin Urine: NEGATIVE
Hgb urine dipstick: NEGATIVE
Ketones, ur: NEGATIVE
Nitrite: NEGATIVE
RBC / HPF: NONE SEEN (ref 0–?)
Specific Gravity, Urine: 1.01 (ref 1.000–1.030)
Total Protein, Urine: NEGATIVE
Urine Glucose: NEGATIVE
Urobilinogen, UA: 0.2 (ref 0.0–1.0)
pH: 6.5 (ref 5.0–8.0)

## 2021-10-14 LAB — CBC WITH DIFFERENTIAL/PLATELET
Basophils Absolute: 0.1 10*3/uL (ref 0.0–0.1)
Basophils Relative: 1.4 % (ref 0.0–3.0)
Eosinophils Absolute: 0.1 10*3/uL (ref 0.0–0.7)
Eosinophils Relative: 1.3 % (ref 0.0–5.0)
HCT: 39.9 % (ref 39.0–52.0)
Hemoglobin: 13.4 g/dL (ref 13.0–17.0)
Lymphocytes Relative: 16.2 % (ref 12.0–46.0)
Lymphs Abs: 1.6 10*3/uL (ref 0.7–4.0)
MCHC: 33.6 g/dL (ref 30.0–36.0)
MCV: 91.1 fl (ref 78.0–100.0)
Monocytes Absolute: 0.7 10*3/uL (ref 0.1–1.0)
Monocytes Relative: 6.8 % (ref 3.0–12.0)
Neutro Abs: 7.5 10*3/uL (ref 1.4–7.7)
Neutrophils Relative %: 74.3 % (ref 43.0–77.0)
Platelets: 228 10*3/uL (ref 150.0–400.0)
RBC: 4.38 Mil/uL (ref 4.22–5.81)
RDW: 14.5 % (ref 11.5–15.5)
WBC: 10.1 10*3/uL (ref 4.0–10.5)

## 2021-10-14 LAB — COMPREHENSIVE METABOLIC PANEL
ALT: 18 U/L (ref 0–53)
AST: 17 U/L (ref 0–37)
Albumin: 4 g/dL (ref 3.5–5.2)
Alkaline Phosphatase: 69 U/L (ref 39–117)
BUN: 21 mg/dL (ref 6–23)
CO2: 29 mEq/L (ref 19–32)
Calcium: 9.7 mg/dL (ref 8.4–10.5)
Chloride: 97 mEq/L (ref 96–112)
Creatinine, Ser: 0.92 mg/dL (ref 0.40–1.50)
GFR: 80.3 mL/min (ref >60.00)
Glucose, Bld: 92 mg/dL (ref 70–99)
Potassium: 4.2 mEq/L (ref 3.5–5.1)
Sodium: 133 mEq/L — ABNORMAL LOW (ref 135–145)
Total Bilirubin: 0.4 mg/dL (ref 0.2–1.2)
Total Protein: 7.5 g/dL (ref 6.0–8.3)

## 2021-10-15 DIAGNOSIS — S72001D Fracture of unspecified part of neck of right femur, subsequent encounter for closed fracture with routine healing: Secondary | ICD-10-CM | POA: Diagnosis not present

## 2021-10-15 DIAGNOSIS — F0280 Dementia in other diseases classified elsewhere without behavioral disturbance: Secondary | ICD-10-CM | POA: Diagnosis not present

## 2021-10-15 DIAGNOSIS — S2231XD Fracture of one rib, right side, subsequent encounter for fracture with routine healing: Secondary | ICD-10-CM | POA: Diagnosis not present

## 2021-10-15 DIAGNOSIS — I10 Essential (primary) hypertension: Secondary | ICD-10-CM | POA: Diagnosis not present

## 2021-10-15 DIAGNOSIS — G3183 Dementia with Lewy bodies: Secondary | ICD-10-CM | POA: Diagnosis not present

## 2021-10-15 DIAGNOSIS — S065X0D Traumatic subdural hemorrhage without loss of consciousness, subsequent encounter: Secondary | ICD-10-CM | POA: Diagnosis not present

## 2021-10-15 LAB — URINE CULTURE: Result:: NO GROWTH

## 2021-10-18 DIAGNOSIS — S2231XD Fracture of one rib, right side, subsequent encounter for fracture with routine healing: Secondary | ICD-10-CM | POA: Diagnosis not present

## 2021-10-18 DIAGNOSIS — I10 Essential (primary) hypertension: Secondary | ICD-10-CM | POA: Diagnosis not present

## 2021-10-18 DIAGNOSIS — G3183 Dementia with Lewy bodies: Secondary | ICD-10-CM | POA: Diagnosis not present

## 2021-10-18 DIAGNOSIS — S065X0D Traumatic subdural hemorrhage without loss of consciousness, subsequent encounter: Secondary | ICD-10-CM | POA: Diagnosis not present

## 2021-10-18 DIAGNOSIS — F0280 Dementia in other diseases classified elsewhere without behavioral disturbance: Secondary | ICD-10-CM | POA: Diagnosis not present

## 2021-10-18 DIAGNOSIS — S72001D Fracture of unspecified part of neck of right femur, subsequent encounter for closed fracture with routine healing: Secondary | ICD-10-CM | POA: Diagnosis not present

## 2021-10-20 DIAGNOSIS — R930 Abnormal findings on diagnostic imaging of skull and head, not elsewhere classified: Secondary | ICD-10-CM | POA: Diagnosis not present

## 2021-10-20 DIAGNOSIS — Z982 Presence of cerebrospinal fluid drainage device: Secondary | ICD-10-CM | POA: Diagnosis not present

## 2021-10-20 DIAGNOSIS — S065XAA Traumatic subdural hemorrhage with loss of consciousness status unknown, initial encounter: Secondary | ICD-10-CM | POA: Diagnosis not present

## 2021-10-21 DIAGNOSIS — S72001D Fracture of unspecified part of neck of right femur, subsequent encounter for closed fracture with routine healing: Secondary | ICD-10-CM | POA: Diagnosis not present

## 2021-10-21 DIAGNOSIS — G3183 Dementia with Lewy bodies: Secondary | ICD-10-CM | POA: Diagnosis not present

## 2021-10-21 DIAGNOSIS — I10 Essential (primary) hypertension: Secondary | ICD-10-CM | POA: Diagnosis not present

## 2021-10-21 DIAGNOSIS — S065X0D Traumatic subdural hemorrhage without loss of consciousness, subsequent encounter: Secondary | ICD-10-CM | POA: Diagnosis not present

## 2021-10-21 DIAGNOSIS — F0280 Dementia in other diseases classified elsewhere without behavioral disturbance: Secondary | ICD-10-CM | POA: Diagnosis not present

## 2021-10-21 DIAGNOSIS — S2231XD Fracture of one rib, right side, subsequent encounter for fracture with routine healing: Secondary | ICD-10-CM | POA: Diagnosis not present

## 2021-10-26 DIAGNOSIS — M25551 Pain in right hip: Secondary | ICD-10-CM | POA: Diagnosis not present

## 2021-10-26 DIAGNOSIS — M1612 Unilateral primary osteoarthritis, left hip: Secondary | ICD-10-CM | POA: Diagnosis not present

## 2021-10-26 DIAGNOSIS — Z96641 Presence of right artificial hip joint: Secondary | ICD-10-CM | POA: Diagnosis not present

## 2021-10-26 DIAGNOSIS — Z471 Aftercare following joint replacement surgery: Secondary | ICD-10-CM | POA: Diagnosis not present

## 2021-10-26 DIAGNOSIS — S72001S Fracture of unspecified part of neck of right femur, sequela: Secondary | ICD-10-CM | POA: Diagnosis not present

## 2021-10-26 DIAGNOSIS — M461 Sacroiliitis, not elsewhere classified: Secondary | ICD-10-CM | POA: Diagnosis not present

## 2021-10-26 DIAGNOSIS — Z9689 Presence of other specified functional implants: Secondary | ICD-10-CM | POA: Diagnosis not present

## 2021-10-27 DIAGNOSIS — G3183 Dementia with Lewy bodies: Secondary | ICD-10-CM | POA: Diagnosis not present

## 2021-10-27 DIAGNOSIS — F0280 Dementia in other diseases classified elsewhere without behavioral disturbance: Secondary | ICD-10-CM | POA: Diagnosis not present

## 2021-10-27 DIAGNOSIS — S72001D Fracture of unspecified part of neck of right femur, subsequent encounter for closed fracture with routine healing: Secondary | ICD-10-CM | POA: Diagnosis not present

## 2021-10-27 DIAGNOSIS — S2231XD Fracture of one rib, right side, subsequent encounter for fracture with routine healing: Secondary | ICD-10-CM | POA: Diagnosis not present

## 2021-10-27 DIAGNOSIS — I10 Essential (primary) hypertension: Secondary | ICD-10-CM | POA: Diagnosis not present

## 2021-10-27 DIAGNOSIS — S065X0D Traumatic subdural hemorrhage without loss of consciousness, subsequent encounter: Secondary | ICD-10-CM | POA: Diagnosis not present

## 2021-10-29 DIAGNOSIS — G4700 Insomnia, unspecified: Secondary | ICD-10-CM | POA: Diagnosis not present

## 2021-10-29 DIAGNOSIS — G3183 Dementia with Lewy bodies: Secondary | ICD-10-CM | POA: Diagnosis not present

## 2021-10-29 DIAGNOSIS — S72001D Fracture of unspecified part of neck of right femur, subsequent encounter for closed fracture with routine healing: Secondary | ICD-10-CM | POA: Diagnosis not present

## 2021-10-29 DIAGNOSIS — G40909 Epilepsy, unspecified, not intractable, without status epilepticus: Secondary | ICD-10-CM

## 2021-10-29 DIAGNOSIS — Z8679 Personal history of other diseases of the circulatory system: Secondary | ICD-10-CM

## 2021-10-29 DIAGNOSIS — Z7901 Long term (current) use of anticoagulants: Secondary | ICD-10-CM

## 2021-10-29 DIAGNOSIS — I10 Essential (primary) hypertension: Secondary | ICD-10-CM | POA: Diagnosis not present

## 2021-10-29 DIAGNOSIS — H1033 Unspecified acute conjunctivitis, bilateral: Secondary | ICD-10-CM

## 2021-10-29 DIAGNOSIS — F0280 Dementia in other diseases classified elsewhere without behavioral disturbance: Secondary | ICD-10-CM | POA: Diagnosis not present

## 2021-10-29 DIAGNOSIS — Z9889 Other specified postprocedural states: Secondary | ICD-10-CM

## 2021-10-29 DIAGNOSIS — Z9181 History of falling: Secondary | ICD-10-CM

## 2021-10-29 DIAGNOSIS — Z96641 Presence of right artificial hip joint: Secondary | ICD-10-CM

## 2021-10-29 DIAGNOSIS — M5136 Other intervertebral disc degeneration, lumbar region: Secondary | ICD-10-CM | POA: Diagnosis not present

## 2021-10-29 DIAGNOSIS — K5900 Constipation, unspecified: Secondary | ICD-10-CM

## 2021-10-29 DIAGNOSIS — S2231XD Fracture of one rib, right side, subsequent encounter for fracture with routine healing: Secondary | ICD-10-CM | POA: Diagnosis not present

## 2021-10-29 DIAGNOSIS — Z9842 Cataract extraction status, left eye: Secondary | ICD-10-CM

## 2021-10-29 DIAGNOSIS — Z9841 Cataract extraction status, right eye: Secondary | ICD-10-CM

## 2021-10-29 DIAGNOSIS — G4733 Obstructive sleep apnea (adult) (pediatric): Secondary | ICD-10-CM | POA: Diagnosis not present

## 2021-10-29 DIAGNOSIS — B309 Viral conjunctivitis, unspecified: Secondary | ICD-10-CM

## 2021-10-29 DIAGNOSIS — Z20822 Contact with and (suspected) exposure to covid-19: Secondary | ICD-10-CM | POA: Diagnosis not present

## 2021-10-29 DIAGNOSIS — M5134 Other intervertebral disc degeneration, thoracic region: Secondary | ICD-10-CM | POA: Diagnosis not present

## 2021-10-29 DIAGNOSIS — Z87891 Personal history of nicotine dependence: Secondary | ICD-10-CM

## 2021-10-29 DIAGNOSIS — E785 Hyperlipidemia, unspecified: Secondary | ICD-10-CM

## 2021-10-29 DIAGNOSIS — S065X0D Traumatic subdural hemorrhage without loss of consciousness, subsequent encounter: Secondary | ICD-10-CM | POA: Diagnosis not present

## 2021-10-29 DIAGNOSIS — J309 Allergic rhinitis, unspecified: Secondary | ICD-10-CM | POA: Diagnosis not present

## 2021-10-29 DIAGNOSIS — M8580 Other specified disorders of bone density and structure, unspecified site: Secondary | ICD-10-CM | POA: Diagnosis not present

## 2021-11-01 DIAGNOSIS — F0280 Dementia in other diseases classified elsewhere without behavioral disturbance: Secondary | ICD-10-CM | POA: Diagnosis not present

## 2021-11-01 DIAGNOSIS — S72001D Fracture of unspecified part of neck of right femur, subsequent encounter for closed fracture with routine healing: Secondary | ICD-10-CM | POA: Diagnosis not present

## 2021-11-01 DIAGNOSIS — S2231XD Fracture of one rib, right side, subsequent encounter for fracture with routine healing: Secondary | ICD-10-CM | POA: Diagnosis not present

## 2021-11-01 DIAGNOSIS — I10 Essential (primary) hypertension: Secondary | ICD-10-CM | POA: Diagnosis not present

## 2021-11-01 DIAGNOSIS — S065X0D Traumatic subdural hemorrhage without loss of consciousness, subsequent encounter: Secondary | ICD-10-CM | POA: Diagnosis not present

## 2021-11-01 DIAGNOSIS — G3183 Dementia with Lewy bodies: Secondary | ICD-10-CM | POA: Diagnosis not present

## 2021-11-05 DIAGNOSIS — S065XAD Traumatic subdural hemorrhage with loss of consciousness status unknown, subsequent encounter: Secondary | ICD-10-CM | POA: Diagnosis not present

## 2021-11-05 DIAGNOSIS — S065X9A Traumatic subdural hemorrhage with loss of consciousness of unspecified duration, initial encounter: Secondary | ICD-10-CM | POA: Diagnosis not present

## 2021-11-05 DIAGNOSIS — Z982 Presence of cerebrospinal fluid drainage device: Secondary | ICD-10-CM | POA: Diagnosis not present

## 2021-11-08 ENCOUNTER — Encounter: Payer: Self-pay | Admitting: Physician Assistant

## 2021-11-08 ENCOUNTER — Ambulatory Visit: Payer: Medicare Other | Admitting: Physician Assistant

## 2021-11-08 NOTE — Progress Notes (Addendum)
? ?Cardiology Office Note   ? ?Date:  11/09/2021  ? ?ID:  Jeff Weaver, DOB [birth year], MRN [id number] ? ?PCP:  [name], MD  ?Cardiologist:  Fransico Him, MD  upon transfer from Dr. [name] previously but primarily sees me ?Electrophysiologist:  Will Meredith Leeds, MD  ? ?Chief Complaint: f/u CHF ? ?History of Present Illness:  ? ?Jeff Weaver is a 76 y.o. 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 ?  ?He has a long history of chronic atypical chest pain with intermittent evaluations over the years. Last cath was in 01/12/18 showing minimal nonobstructive CAD, normal LVEF, normal LVEDP. He had 2nd degree AVB type 1 during cath. He wore a monitor 02/2018 which showed profound bradycardia, AVB with frequently nonconducted P waves, frequent PVCs (7%) and short runs of NSVT so underwent PPM implantation. Repeat echo 11/2020 demonstrated a decline in EF to 35% with findings below. He underwent nuclear stress test 12/2020 that was negative for ischemia. He was seen by EP with concern that RV pacing was contributing to systolic dysfunction so underwent upgrade to CRT-P in 03/2021. Though he's done reasonably well from a cardiac standpoint since that time, he has had several complex medical admissions over the last several months: 04/2021 for progressive cognitive decline ultimately felt to have Lewy body dementia and 07/2021 for ground level fall 2/2 pre-syncope with right hip fracture, rib fracture, subdural hematoma,  possible aspiration  penumonitis, delirium, and complicated UTI. He underwent hip replacement. His SDH appears to have been managed conservatively. I also take care of his wife Cephas Revard who previously relayed she was concerned during the hospitalization she felt as though the focus was on his dementia and less so on care of his other medical problems. ? ?He is seen for follow-up today with his wife. She keeps diligent records of his recent symptoms and he also contributes to the conversation. They relay that he has been sleeping a lot, dreaming more than he would like, has been following with PCP about a groin infection, and reporting cold intolerance. He still has brief fleeting chest pains periodically. He reports this is about once a month which is similar to prior report. Breathing is OK but he complains of generalized fatigue. His weight has been variable as he lost quite a bit of weight after the events earlier this year then gained it back when coming home. [name] says that occasionally when he goes to other offices his BP will be 90s/40s and that PCP suggested he may be able to come off of some of his BP meds. He has multiple specialists caring for all aspects of his health. [name] indicates he does not know of his Lewy body dementia diagnosis. She has been concerned in the past that he gets upset when hearing bad news. ? ? ?Labwork independently reviewed: ?10/2021 CBC wnl, K 4.2, Cr 0.92, Na 133 (intermittently low) ?08/2021 Mg 2.0 ?11/2020 LDL 73 ? ?Cardiology Studies:  ? ?Studies reviewed are outlined and summarized above. Reports included below  if pertinent.  ? ?NST 12/2020 ?The left ventricular ejection fraction is mildly decreased (45-54%). ?Nuclear stress EF: 46%. ?There was no ST segment deviation noted during stress. ?No T wave inversion was noted during stress. ?The study is normal. ?This is an intermediate risk study due to reduced systolic function. There is no ischemia. ? ? ?2D echo 11/2020 ? 1. Left ventricular  ejection fraction, by estimation, is 35% with  ?hypokinesis of the inferoseptal, inferior , mid/distal anterior, distal  ?lateral and apical walls . The left ventricle has moderately decreased  ?function. There is moderate left ventricular  ? hypertrophy. Left ventricular diastolic parameters are consistent with  ?Grade I diastolic dysfunction (impaired relaxation). Elevated left atrial  ?pressure. Compared to echo report from 2017, LVEF is worse  ? 2. Right ventricular systolic function is low normal. The right  ?ventricular size is normal.  ? 3. The mitral valve is normal in structure. No evidence of mitral valve  ?regurgitation.  ? 4. The aortic valve is normal in structure. Aortic valve regurgitation is  ?mild.  ? 5. Aortic dilatation noted. There is mild dilatation of the aortic root,  ?measuring 41 mm.   ? ? ?Past Medical History:  ?Diagnosis Date  ? Aortic insufficiency   ? a. mild 2017 echo.  ? Atypical chest pain   ? Cardiac resynchronization therapy pacemaker (CRT-P) in place   ? Cerebral aneurysm   ? a. s/p clipping at [facility] in 8/13 c/b short-term memory loss, cerebral hemorrhage and seizure disorder s/p VP shunt.  ? Cerebral hemorrhage ([place])   ? Chronic combined systolic and diastolic CHF (congestive heart failure) ([place])   ? CVA (cerebral infarction)   ? Diverticulosis   ? Enteritis (regional)   ? Dr Olevia Perches  ? First degree AV block   ? GI bleed   ? HTN (hypertension)   ? Hyperlipidemia   ? Hypertensive heart disease   ? IBS (irritable bowel syndrome)   ? Lewy body dementia ([place])   ? Mild CAD   ? Cardiac cath 01/12/18 showed minimal nonobstructive CAD, normal LVEF, normal LVEDP.  ? NSVT (nonsustained ventricular tachycardia) ([place])   ? Orthostatic hypotension   ? Pacemaker   ? Pre-diabetes   ? PVC's (premature ventricular contractions)   ? 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 Rheumatoid arthritis(714.0)   ? dxed in [employer] in 1980s  ? Second degree AV block, Mobitz type I   ? Seizures  ([place])   ? last April 26,2021  ? Sinus bradycardia   ? Sleep apnea   ? SSS (sick sinus syndrome) ([place])   ? Subdural hematoma (HCC)   ? ? ?Past Surgical History:  ?Procedure Laterality Date  ? BIV PACEMAKER INSERTION CRT-P N/A 03/10/2021  ? Procedure: BIV PACEMAKER INSERTION CRT-P UPGRADE;  Surgeon: Constance Haw, MD;  Location: [place] [facility];  Service: Cardiovascular;  Laterality: N/A;  ? CHOLECYSTECTOMY N/A 01/07/2016  ? Procedure: LAPAROSCOPIC CHOLECYSTECTOMY ;  Surgeon: Coralie Keens, MD;  Location: [place];  Service: General;  Laterality: N/A;  ? cns shunt  02/23/12  ? COLONOSCOPY W/ POLYPECTOMY  1978  ? negative 2009, Dr Olevia Perches. Due 2014  ? CRANIOTOMY  02/02/12  ? Dr Harvel Ricks, [place] of aneurysm  ? CRANIOTOMY  02-02-12  ? left pterional craniotomy for clipping complex anterior communicating artery aneurysm   ? HERNIA REPAIR    ? LEFT HEART CATH AND CORONARY ANGIOGRAPHY N/A 01/12/2018  ? Procedure: LEFT HEART CATH AND CORONARY  ANGIOGRAPHY;  Surgeon: Martinique, Peter M, MD;  Location: [place] [facility];  Service: Cardiovascular;  Laterality: N/A;  ? LEFT HEART CATHETERIZATION WITH CORONARY ANGIOGRAM N/A 11/26/2013  ? Procedure: LEFT HEART CATHETERIZATION WITH CORONARY ANGIOGRAM;  Surgeon: [name], MD;  Location: [facility];  Service: Cardiovascular;  Laterality: N/A;  ? NOSE SURGERY    ? PACEMAKER IMPLANT N/A 07/02/2018  ? Procedure: PACEMAKER IMPLANT;  Surgeon: Constance Haw, MD;  Location: [place] [facility];  Service: Cardiovascular;  Laterality: N/A;  ? [place]  ? TONSILLECTOMY AND ADENOIDECTOMY    ? VENTRICULOPERITONEAL SHUNT  02-23-12  ? INSERTION OF RIGHT FRONTAL VENTRICULOPERITONEAL SHUNT WITH A CODMAN HAKIM PROGRAMMABLE VALVE  ? ? ?Current Medications: ?Current Meds  ?Medication Sig  ? acetaminophen (TYLENOL) 325 MG tablet Take 325-650 mg by mouth every 6 (six) hours as needed (pain/headaches.).  ? amLODipine (NORVASC) 5 MG tablet Take 1 tablet (5 mg total) by mouth  daily.  ? cetirizine (ZYRTEC) 10 MG tablet Take 1 tablet (10 mg total) by mouth daily. (Patient taking differently: Take 10 mg by mouth at bedtime.)  ? Cholecalciferol (VITAMIN D) 50 MCG (2000 UT) CAPS Take 2,000

## 2021-11-09 ENCOUNTER — Ambulatory Visit (INDEPENDENT_AMBULATORY_CARE_PROVIDER_SITE_OTHER): Payer: Medicare Other | Admitting: Physician Assistant

## 2021-11-09 ENCOUNTER — Encounter: Payer: Self-pay | Admitting: Physician Assistant

## 2021-11-09 VITALS — BP 114/62 | HR 60 | Ht 68.5 in | Wt 226.0 lb

## 2021-11-09 DIAGNOSIS — I493 Ventricular premature depolarization: Secondary | ICD-10-CM

## 2021-11-09 DIAGNOSIS — I10 Essential (primary) hypertension: Secondary | ICD-10-CM | POA: Diagnosis not present

## 2021-11-09 DIAGNOSIS — R6889 Other general symptoms and signs: Secondary | ICD-10-CM | POA: Diagnosis not present

## 2021-11-09 DIAGNOSIS — S065XAA Traumatic subdural hemorrhage with loss of consciousness status unknown, initial encounter: Secondary | ICD-10-CM

## 2021-11-09 DIAGNOSIS — I2510 Atherosclerotic heart disease of native coronary artery without angina pectoris: Secondary | ICD-10-CM

## 2021-11-09 DIAGNOSIS — I5042 Chronic combined systolic (congestive) and diastolic (congestive) heart failure: Secondary | ICD-10-CM | POA: Diagnosis not present

## 2021-11-09 NOTE — Patient Instructions (Addendum)
Medication Instructions:  ?DISCONTINUE Amlodipine ?*If you need a refill on your cardiac medications before your next appointment, please call your pharmacy* ? ? ?Lab Work: ?Your physician recommends that you return for lab work in: [place] AS ECHO-FASTING CMET, LIPIDS, CBC, TSH ?If you have labs (blood work) drawn today and your tests are completely normal, you will receive your results only by: ?MyChart Message (if you have MyChart) OR ?A paper copy in the mail ?If you have any lab test that is abnormal or we need to change your treatment, we will call you to review the results. ? ? ?Testing/Procedures: ?Your physician has requested that you have an echocardiogram. Echocardiography is a painless test that uses sound waves to create images of your heart. It provides your doctor with information about the size and shape of your heart and how well your heart?s chambers and valves are working. This procedure takes approximately one hour. There are no restrictions for this procedure. ? ? ?Follow-Up: ?At [facility], you and your health needs are our priority.  As part of our continuing mission to provide you with exceptional heart care, we have created designated Provider Care Teams.  These Care Teams include your primary Cardiologist (physician) and Advanced Practice Providers (APPs -  Physician Assistants and Nurse Practitioners) who all work together to provide you with the care you need, when you need it. ? ?We recommend signing up for the patient portal called "MyChart".  Sign up information is provided on this After Visit Summary.  MyChart is used to connect with patients for Virtual Visits (Telemedicine).  Patients are able to view lab/test results, encounter notes, upcoming appointments, etc.  Non-urgent messages can be sent to your provider as well.   ?To learn more about what you can do with MyChart, go to [email or web address].   ? ?Your next appointment:   ?3-4 month(s) ? ?The format for your next  appointment:   ?In Person ? ?Provider:   ?[name], PA   ? ? ?Other Instructions ? ? ?Important Information About Sugar ? ? ? ? ?  ?

## 2021-11-10 DIAGNOSIS — U071 COVID-19: Secondary | ICD-10-CM | POA: Diagnosis not present

## 2021-11-10 DIAGNOSIS — F0280 Dementia in other diseases classified elsewhere without behavioral disturbance: Secondary | ICD-10-CM | POA: Diagnosis not present

## 2021-11-10 DIAGNOSIS — S72001D Fracture of unspecified part of neck of right femur, subsequent encounter for closed fracture with routine healing: Secondary | ICD-10-CM | POA: Diagnosis not present

## 2021-11-10 DIAGNOSIS — S065X0D Traumatic subdural hemorrhage without loss of consciousness, subsequent encounter: Secondary | ICD-10-CM | POA: Diagnosis not present

## 2021-11-10 DIAGNOSIS — I10 Essential (primary) hypertension: Secondary | ICD-10-CM | POA: Diagnosis not present

## 2021-11-10 DIAGNOSIS — S2231XD Fracture of one rib, right side, subsequent encounter for fracture with routine healing: Secondary | ICD-10-CM | POA: Diagnosis not present

## 2021-11-10 DIAGNOSIS — G3183 Dementia with Lewy bodies: Secondary | ICD-10-CM | POA: Diagnosis not present

## 2021-11-11 DIAGNOSIS — G3183 Dementia with Lewy bodies: Secondary | ICD-10-CM | POA: Diagnosis not present

## 2021-11-11 DIAGNOSIS — S065X0D Traumatic subdural hemorrhage without loss of consciousness, subsequent encounter: Secondary | ICD-10-CM | POA: Diagnosis not present

## 2021-11-11 DIAGNOSIS — I10 Essential (primary) hypertension: Secondary | ICD-10-CM | POA: Diagnosis not present

## 2021-11-11 DIAGNOSIS — S72001D Fracture of unspecified part of neck of right femur, subsequent encounter for closed fracture with routine healing: Secondary | ICD-10-CM | POA: Diagnosis not present

## 2021-11-11 DIAGNOSIS — F0280 Dementia in other diseases classified elsewhere without behavioral disturbance: Secondary | ICD-10-CM | POA: Diagnosis not present

## 2021-11-11 DIAGNOSIS — S2231XD Fracture of one rib, right side, subsequent encounter for fracture with routine healing: Secondary | ICD-10-CM | POA: Diagnosis not present

## 2021-11-12 DIAGNOSIS — Z9841 Cataract extraction status, right eye: Secondary | ICD-10-CM | POA: Diagnosis not present

## 2021-11-12 DIAGNOSIS — Z8679 Personal history of other diseases of the circulatory system: Secondary | ICD-10-CM | POA: Diagnosis not present

## 2021-11-12 DIAGNOSIS — G4733 Obstructive sleep apnea (adult) (pediatric): Secondary | ICD-10-CM | POA: Diagnosis not present

## 2021-11-12 DIAGNOSIS — S065X0D Traumatic subdural hemorrhage without loss of consciousness, subsequent encounter: Secondary | ICD-10-CM | POA: Diagnosis not present

## 2021-11-12 DIAGNOSIS — Z9181 History of falling: Secondary | ICD-10-CM | POA: Diagnosis not present

## 2021-11-12 DIAGNOSIS — I10 Essential (primary) hypertension: Secondary | ICD-10-CM | POA: Diagnosis not present

## 2021-11-12 DIAGNOSIS — J309 Allergic rhinitis, unspecified: Secondary | ICD-10-CM | POA: Diagnosis not present

## 2021-11-12 DIAGNOSIS — Z9889 Other specified postprocedural states: Secondary | ICD-10-CM | POA: Diagnosis not present

## 2021-11-12 DIAGNOSIS — G40909 Epilepsy, unspecified, not intractable, without status epilepticus: Secondary | ICD-10-CM | POA: Diagnosis not present

## 2021-11-12 DIAGNOSIS — Z9842 Cataract extraction status, left eye: Secondary | ICD-10-CM | POA: Diagnosis not present

## 2021-11-12 DIAGNOSIS — E785 Hyperlipidemia, unspecified: Secondary | ICD-10-CM | POA: Diagnosis not present

## 2021-11-12 DIAGNOSIS — Z87891 Personal history of nicotine dependence: Secondary | ICD-10-CM | POA: Diagnosis not present

## 2021-11-12 DIAGNOSIS — K5900 Constipation, unspecified: Secondary | ICD-10-CM | POA: Diagnosis not present

## 2021-11-12 DIAGNOSIS — Z96641 Presence of right artificial hip joint: Secondary | ICD-10-CM | POA: Diagnosis not present

## 2021-11-12 DIAGNOSIS — S2231XD Fracture of one rib, right side, subsequent encounter for fracture with routine healing: Secondary | ICD-10-CM | POA: Diagnosis not present

## 2021-11-12 DIAGNOSIS — S72001D Fracture of unspecified part of neck of right femur, subsequent encounter for closed fracture with routine healing: Secondary | ICD-10-CM | POA: Diagnosis not present

## 2021-11-12 DIAGNOSIS — M8580 Other specified disorders of bone density and structure, unspecified site: Secondary | ICD-10-CM | POA: Diagnosis not present

## 2021-11-12 DIAGNOSIS — M5136 Other intervertebral disc degeneration, lumbar region: Secondary | ICD-10-CM | POA: Diagnosis not present

## 2021-11-12 DIAGNOSIS — G4700 Insomnia, unspecified: Secondary | ICD-10-CM | POA: Diagnosis not present

## 2021-11-12 DIAGNOSIS — G3183 Dementia with Lewy bodies: Secondary | ICD-10-CM | POA: Diagnosis not present

## 2021-11-12 DIAGNOSIS — M5134 Other intervertebral disc degeneration, thoracic region: Secondary | ICD-10-CM | POA: Diagnosis not present

## 2021-11-12 DIAGNOSIS — F0280 Dementia in other diseases classified elsewhere without behavioral disturbance: Secondary | ICD-10-CM | POA: Diagnosis not present

## 2021-11-15 ENCOUNTER — Ambulatory Visit (INDEPENDENT_AMBULATORY_CARE_PROVIDER_SITE_OTHER): Payer: Medicare Other | Admitting: Cardiology

## 2021-11-15 ENCOUNTER — Encounter: Payer: Self-pay | Admitting: Cardiology

## 2021-11-15 VITALS — BP 110/62 | HR 60 | Ht 68.5 in | Wt 230.8 lb

## 2021-11-15 DIAGNOSIS — I2510 Atherosclerotic heart disease of native coronary artery without angina pectoris: Secondary | ICD-10-CM | POA: Diagnosis not present

## 2021-11-15 DIAGNOSIS — I5022 Chronic systolic (congestive) heart failure: Secondary | ICD-10-CM

## 2021-11-15 LAB — CUP PACEART INCLINIC DEVICE CHECK
Battery Remaining Longevity: 132 mo
Battery Voltage: 3.06 V
Brady Statistic AP VP Percent: 68.78 %
Brady Statistic AP VS Percent: 0.01 %
Brady Statistic AS VP Percent: 30.46 %
Brady Statistic AS VS Percent: 0.76 %
Brady Statistic RA Percent Paced: 69.11 %
Brady Statistic RV Percent Paced: 99.23 %
Date Time Interrogation Session: 20230515162058
Implantable Lead Implant Date: 20191230
Implantable Lead Implant Date: 20191230
Implantable Lead Implant Date: 20220907
Implantable Lead Location: 753858
Implantable Lead Location: 753859
Implantable Lead Location: 753860
Implantable Lead Model: 4598
Implantable Lead Model: 5076
Implantable Lead Model: 5076
Implantable Pulse Generator Implant Date: 20220907
Lead Channel Impedance Value: 285 Ohm
Lead Channel Impedance Value: 342 Ohm
Lead Channel Impedance Value: 399 Ohm
Lead Channel Impedance Value: 399 Ohm
Lead Channel Impedance Value: 399 Ohm
Lead Channel Impedance Value: 418 Ohm
Lead Channel Impedance Value: 456 Ohm
Lead Channel Impedance Value: 494 Ohm
Lead Channel Impedance Value: 589 Ohm
Lead Channel Impedance Value: 646 Ohm
Lead Channel Impedance Value: 646 Ohm
Lead Channel Impedance Value: 741 Ohm
Lead Channel Impedance Value: 760 Ohm
Lead Channel Impedance Value: 779 Ohm
Lead Channel Pacing Threshold Amplitude: 0.625 V
Lead Channel Pacing Threshold Amplitude: 0.75 V
Lead Channel Pacing Threshold Amplitude: 1 V
Lead Channel Pacing Threshold Pulse Width: 0.4 ms
Lead Channel Pacing Threshold Pulse Width: 0.4 ms
Lead Channel Pacing Threshold Pulse Width: 0.4 ms
Lead Channel Sensing Intrinsic Amplitude: 1.5 mV
Lead Channel Sensing Intrinsic Amplitude: 1.75 mV
Lead Channel Sensing Intrinsic Amplitude: 20.625 mV
Lead Channel Sensing Intrinsic Amplitude: 20.625 mV
Lead Channel Setting Pacing Amplitude: 1.25 V
Lead Channel Setting Pacing Amplitude: 1.5 V
Lead Channel Setting Pacing Amplitude: 2 V
Lead Channel Setting Pacing Pulse Width: 0.4 ms
Lead Channel Setting Pacing Pulse Width: 0.4 ms
Lead Channel Setting Sensing Sensitivity: 1.2 mV

## 2021-11-15 NOTE — Progress Notes (Signed)
? ?Electrophysiology Office Note ? ? ?Date:  11/15/2021  ? ?ID:  Jeff Weaver, DOB 04-28-1946, MRN [id number] ? ?PCP:  [name], MD  ?Cardiologist:  Radford Pax ?Primary Electrophysiologist:  Laronica Bhagat Meredith Leeds, MD   ? ?No chief complaint on file. ? ? ?  ?History of Present Illness: ?Jeff Weaver is a 76 y.o. male who is being seen today for the evaluation of bradycardia at the request of [name]. Presenting today for electrophysiology evaluation.   ? ?He has a history significant for obesity, orthostasis, hypertension, prediabetes, cerebral aneurysm post clipping, seizure disorder status post VP shunt, sleep apnea, aortic insufficiency.  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  He developed systolic dysfunction and is status post CRT-P upgrade 03/10/2021. ? ?He was hospitalized for progressive cognitive decline.  He was found to have Lewy body dementia.  He had a fall January 2023 with presyncope and a right sided hip fracture, rib fracture, subdural hematoma, aspiration pneumonitis, delirium, a complicated UTI.  He underwent hip replacement surgery.  Subdural hematoma was managed conservatively. ? ?Today, denies symptoms of palpitations, chest pain, shortness of breath, orthopnea, PND, lower extremity edema, claudication, dizziness, presyncope, syncope, bleeding, or neurologic sequela. The patient is tolerating medications without difficulties.  He is working with physical therapy.  He is improving, though slowly.  He was in the hospital for 1 month and then was in a nursing home for another month after that. ? ? ?Past Medical History:  ?Diagnosis Date  ? Aortic insufficiency   ? a. mild 2017 echo.  ? Atypical chest pain   ? Cardiac resynchronization therapy pacemaker (CRT-P) in place   ? Cerebral aneurysm   ? a. s/p clipping at [facility] in 8/13 c/b short-term memory loss, cerebral hemorrhage and seizure disorder s/p VP shunt.  ?  Cerebral hemorrhage ([place])   ? Chronic combined systolic and diastolic CHF (congestive heart failure) ([place])   ? CVA (cerebral infarction)   ? Diverticulosis   ? Enteritis (regional)   ? Dr Olevia Perches  ? First degree AV block   ? GI bleed   ? HTN (hypertension)   ? Hyperlipidemia   ? Hypertensive heart disease   ? IBS (irritable bowel syndrome)   ? Lewy body dementia ([place])   ? Mild CAD   ? Cardiac cath 01/12/18 showed minimal nonobstructive CAD, normal LVEF, normal LVEDP.  ? NSVT (nonsustained ventricular tachycardia) ([place])   ? Orthostatic hypotension   ? Pacemaker   ? Pre-diabetes   ? PVC's (premature ventricular contractions)   ? 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 Rheumatoid arthritis(714.0)   ? dxed in [employer] in 1980s  ? Second degree AV block, Mobitz type I   ? Seizures ([place])   ? last April 26,2021  ? Sinus bradycardia   ? Sleep apnea   ? SSS (sick sinus syndrome) ([place])   ? Subdural hematoma (HCC)   ? ?Past Surgical History:  ?Procedure Laterality Date  ? BIV PACEMAKER INSERTION CRT-P N/A 03/10/2021  ? Procedure: BIV PACEMAKER INSERTION CRT-P UPGRADE;  Surgeon: Constance Haw, MD;  Location: [place] [facility];  Service: Cardiovascular;  Laterality: N/A;  ? CHOLECYSTECTOMY N/A 01/07/2016  ? Procedure: LAPAROSCOPIC CHOLECYSTECTOMY ;  Surgeon: Coralie Keens, MD;  Location: [place];  Service: General;  Laterality: N/A;  ? cns shunt  02/23/12  ? COLONOSCOPY W/ POLYPECTOMY  1978  ? negative 2009, Dr Olevia Perches. Due  2014  ? CRANIOTOMY  02/02/12  ? Dr Harvel Ricks, [place] of aneurysm  ? CRANIOTOMY  02-02-12  ? left pterional craniotomy for clipping complex anterior communicating artery aneurysm   ? HERNIA REPAIR    ? LEFT HEART CATH AND CORONARY ANGIOGRAPHY N/A 01/12/2018  ? Procedure: LEFT HEART CATH AND CORONARY ANGIOGRAPHY;  Surgeon: Martinique, Peter M, MD;  Location: [place] [facility];  Service: Cardiovascular;  Laterality: N/A;  ? LEFT HEART CATHETERIZATION WITH CORONARY ANGIOGRAM N/A 11/26/2013  ?  Procedure: LEFT HEART CATHETERIZATION WITH CORONARY ANGIOGRAM;  Surgeon: [name], MD;  Location: [facility] CATH LAB;  Service: Cardiovascular;  Laterality: N/A;  ? NOSE SURGERY    ? PACEMAKER IMPLANT N/A 07/02/2018  ? Procedure: PACEMAKER IMPLANT;  Surgeon: Constance Haw, MD;  Location: [place] [facility];  Service: Cardiovascular;  Laterality: N/A;  ? [place]  ? TONSILLECTOMY AND ADENOIDECTOMY    ? VENTRICULOPERITONEAL SHUNT  02-23-12  ? INSERTION OF RIGHT FRONTAL VENTRICULOPERITONEAL SHUNT WITH A CODMAN HAKIM PROGRAMMABLE VALVE  ? ? ? ?Current Outpatient Medications  ?Medication Sig Dispense Refill  ? acetaminophen (TYLENOL) 325 MG tablet Take 325-650 mg by mouth every 6 (six) hours as needed (pain/headaches.).    ? cetirizine (ZYRTEC) 10 MG tablet Take 1 tablet (10 mg total) by mouth daily. (Patient taking differently: Take 10 mg by mouth at bedtime.) 30 tablet 11  ? Cholecalciferol (VITAMIN D) 50 MCG (2000 UT) CAPS Take 2,000 Units by mouth in the morning.    ? ciprofloxacin (CIPRO) 500 MG tablet Take 1 tablet (500 mg total) by mouth 2 (two) times daily. 84 tablet 0  ? clonazePAM (KLONOPIN) 1 MG disintegrating tablet Take 1 mg by mouth as directed. ALLOW 1 TABLET TO DISSOLVE IN CHEEK FOR SEIZURES LASTING LONGER THAN 4 MINUTES. OR AFTER 2ND SEIZURE IN 24 HOURS    ? desonide (DESOWEN) 0.05 % ointment Apply 1 application topically 2 (two) times daily as needed (skin irritation).    ? donepezil (ARICEPT) 10 MG tablet Take half a tablet in the morning for a month and then increase to 1 full tablet daily. 30 tablet 5  ? erythromycin ophthalmic ointment Place 1 application. into both eyes at bedtime. 3.5 g 0  ? furosemide (LASIX) 40 MG tablet TAKE 1 TABLET (40 MG TOTAL) BY MOUTH DAILY. (Patient taking differently: Take 20 mg by mouth in the morning.) 90 tablet 3  ? levETIRAcetam (KEPPRA) 500 MG tablet Take 500 mg by mouth. Patient is taking 1750 mg in the morning (3 1/2 500 mg tablets) and 1750 mg  in the evening (3 1/2  500 mg tablets)    ? LORazepam (ATIVAN) 1 MG tablet Take 1 mg by mouth every 6 (six) hours as needed for seizure.     ? losartan (COZAAR) 25 MG tablet Take 1 tablet (25 mg total) by mouth daily. 90 tablet 3  ? metoprolol succinate (TOPROL-XL) 25 MG 24 hr tablet Take 1 tablet (25 mg total) by mouth daily. 90 tablet 3  ? Midazolam (NAYZILAM) 5 MG/0.1ML SOLN Place 1 each into the nose as needed (seizure).    ? mirabegron ER (MYRBETRIQ) 50 MG TB24 tablet Take 1 tablet (50 mg total) by mouth daily. (Patient taking differently: Take 50 mg by mouth every evening.) 90 tablet 1  ? NON FORMULARY CPAP Machine: authorized by [facility] in [place]     ? nystatin ointment (MYCOSTATIN) Apply 1 application. topically 2 (two) times daily. 30 g 3  ?  omeprazole (PRILOSEC) 40 MG capsule Take 1 capsule (40 mg total) by mouth daily as needed. 90 capsule 3  ? Propylene Glycol (SYSTANE BALANCE) 0.6 % SOLN Apply 1 drop to eye daily as needed (dry eyes).    ? pyridOXINE (VITAMIN B-6) 100 MG tablet Take 100 mg by mouth in the morning.    ? rosuvastatin (CRESTOR) 5 MG tablet TAKE 1 TABLET BY MOUTH THREE TIMES A WEEK AND INCREASE AS TOLERATED (Patient taking differently: Take 5 mg by mouth every Monday, Wednesday, and Friday. In the evening) 90 tablet 3  ? Tamsulosin HCl (FLOMAX) 0.4 MG CAPS Take 0.4 mg by mouth every evening.    ? thiamine 100 MG tablet Take 100 mg by mouth in the morning.    ? traZODone (DESYREL) 50 MG tablet Take 1 tablet by mouth at bedtime.    ? triamcinolone cream (KENALOG) 0.5 % Apply 1 application. topically 3 (three) times daily. 60 g 1  ? VIMPAT 200 MG TABS tablet Take 200 mg by mouth 2 (two) times daily.    ? vitamin B-12 (CYANOCOBALAMIN) 1000 MCG tablet Take 1,000 mcg by mouth in the morning.    ? nitroGLYCERIN (NITROSTAT) 0.4 MG SL tablet PLACE 1 TABLET (0.4 MG TOTAL) UNDER THE TONGUE EVERY 5 (FIVE) MINUTES AS NEEDED FOR CHEST PAIN. (Patient taking differently: Place 0.4 mg under the tongue every 5  (five) minutes x 3 doses as needed for chest pain.) 25 tablet 7  ? ?No current facility-administered medications for this visit.  ? ? ?Allergies:   Haloperidol, Rosuvastatin, Simvastatin, Lisinopril, Nifedipine, and

## 2021-11-15 NOTE — Patient Instructions (Signed)
Medication Instructions:  ?Your physician recommends that you continue on your current medications as directed. Please refer to the Current Medication list given to you today. ? ?*If you need a refill on your cardiac medications before your next appointment, please call your pharmacy* ? ? ?Lab Work: ?None ordered ? ? ?Testing/Procedures: ?None ordered ? ? ?Follow-Up: ?At [facility], you and your health needs are our priority.  As part of our continuing mission to provide you with exceptional heart care, we have created designated Provider Care Teams.  These Care Teams include your primary Cardiologist (physician) and Advanced Practice Providers (APPs -  Physician Assistants and Nurse Practitioners) who all work together to provide you with the care you need, when you need it. ? ?Remote monitoring is used to monitor your Pacemaker or ICD from home. This monitoring reduces the number of office visits required to check your device to one time per year. It allows [place] to keep an eye on the functioning of your device to ensure it is working properly. You are scheduled for a device check from home on 11/26/2021. You may send your transmission at any time that day. If you have a wireless device, the transmission will be sent automatically. After your physician reviews your transmission, you will receive a postcard with your next transmission date. ? ?Your next appointment:   ?6 months ? ?The format for your next appointment:   ?In Person ? ?Provider:   ?You will see one of the following Advanced Practice Providers on your designated Care Team:   ?Tommye Standard, PA-C ?Legrand Como "Jonni Sanger" Barnwell, PA-C ?  ? ? ?Thank you for choosing [facility]!! ? ? ?Trinidad Curet, RN ?([phone] ? ? ? ?Other Instructions ? ? ?Important Information About [place] ? ? ? ? ?  ?

## 2021-11-17 DIAGNOSIS — I10 Essential (primary) hypertension: Secondary | ICD-10-CM | POA: Diagnosis not present

## 2021-11-17 DIAGNOSIS — F0280 Dementia in other diseases classified elsewhere without behavioral disturbance: Secondary | ICD-10-CM | POA: Diagnosis not present

## 2021-11-17 DIAGNOSIS — S2231XD Fracture of one rib, right side, subsequent encounter for fracture with routine healing: Secondary | ICD-10-CM | POA: Diagnosis not present

## 2021-11-17 DIAGNOSIS — S065X0D Traumatic subdural hemorrhage without loss of consciousness, subsequent encounter: Secondary | ICD-10-CM | POA: Diagnosis not present

## 2021-11-17 DIAGNOSIS — G3183 Dementia with Lewy bodies: Secondary | ICD-10-CM | POA: Diagnosis not present

## 2021-11-17 DIAGNOSIS — S72001D Fracture of unspecified part of neck of right femur, subsequent encounter for closed fracture with routine healing: Secondary | ICD-10-CM | POA: Diagnosis not present

## 2021-11-19 DIAGNOSIS — S72001D Fracture of unspecified part of neck of right femur, subsequent encounter for closed fracture with routine healing: Secondary | ICD-10-CM | POA: Diagnosis not present

## 2021-11-19 DIAGNOSIS — S2231XD Fracture of one rib, right side, subsequent encounter for fracture with routine healing: Secondary | ICD-10-CM | POA: Diagnosis not present

## 2021-11-19 DIAGNOSIS — G3183 Dementia with Lewy bodies: Secondary | ICD-10-CM | POA: Diagnosis not present

## 2021-11-19 DIAGNOSIS — I10 Essential (primary) hypertension: Secondary | ICD-10-CM | POA: Diagnosis not present

## 2021-11-19 DIAGNOSIS — F0280 Dementia in other diseases classified elsewhere without behavioral disturbance: Secondary | ICD-10-CM | POA: Diagnosis not present

## 2021-11-19 DIAGNOSIS — S065X0D Traumatic subdural hemorrhage without loss of consciousness, subsequent encounter: Secondary | ICD-10-CM | POA: Diagnosis not present

## 2021-11-22 DIAGNOSIS — S2231XD Fracture of one rib, right side, subsequent encounter for fracture with routine healing: Secondary | ICD-10-CM | POA: Diagnosis not present

## 2021-11-22 DIAGNOSIS — S72001D Fracture of unspecified part of neck of right femur, subsequent encounter for closed fracture with routine healing: Secondary | ICD-10-CM | POA: Diagnosis not present

## 2021-11-22 DIAGNOSIS — G3183 Dementia with Lewy bodies: Secondary | ICD-10-CM | POA: Diagnosis not present

## 2021-11-22 DIAGNOSIS — I10 Essential (primary) hypertension: Secondary | ICD-10-CM | POA: Diagnosis not present

## 2021-11-22 DIAGNOSIS — F0280 Dementia in other diseases classified elsewhere without behavioral disturbance: Secondary | ICD-10-CM | POA: Diagnosis not present

## 2021-11-22 DIAGNOSIS — S065X0D Traumatic subdural hemorrhage without loss of consciousness, subsequent encounter: Secondary | ICD-10-CM | POA: Diagnosis not present

## 2021-11-23 ENCOUNTER — Other Ambulatory Visit: Payer: Self-pay | Admitting: Internal Medicine

## 2021-11-23 DIAGNOSIS — B3789 Other sites of candidiasis: Secondary | ICD-10-CM

## 2021-11-24 DIAGNOSIS — S2231XD Fracture of one rib, right side, subsequent encounter for fracture with routine healing: Secondary | ICD-10-CM | POA: Diagnosis not present

## 2021-11-24 DIAGNOSIS — F0280 Dementia in other diseases classified elsewhere without behavioral disturbance: Secondary | ICD-10-CM | POA: Diagnosis not present

## 2021-11-24 DIAGNOSIS — I10 Essential (primary) hypertension: Secondary | ICD-10-CM | POA: Diagnosis not present

## 2021-11-24 DIAGNOSIS — S72001D Fracture of unspecified part of neck of right femur, subsequent encounter for closed fracture with routine healing: Secondary | ICD-10-CM | POA: Diagnosis not present

## 2021-11-24 DIAGNOSIS — G3183 Dementia with Lewy bodies: Secondary | ICD-10-CM | POA: Diagnosis not present

## 2021-11-24 DIAGNOSIS — S065X0D Traumatic subdural hemorrhage without loss of consciousness, subsequent encounter: Secondary | ICD-10-CM | POA: Diagnosis not present

## 2021-11-26 ENCOUNTER — Ambulatory Visit: Payer: Medicare Other

## 2021-11-26 DIAGNOSIS — Z9841 Cataract extraction status, right eye: Secondary | ICD-10-CM

## 2021-11-26 DIAGNOSIS — Z87891 Personal history of nicotine dependence: Secondary | ICD-10-CM

## 2021-11-26 DIAGNOSIS — G4733 Obstructive sleep apnea (adult) (pediatric): Secondary | ICD-10-CM | POA: Diagnosis not present

## 2021-11-26 DIAGNOSIS — S72001D Fracture of unspecified part of neck of right femur, subsequent encounter for closed fracture with routine healing: Secondary | ICD-10-CM | POA: Diagnosis not present

## 2021-11-26 DIAGNOSIS — G40909 Epilepsy, unspecified, not intractable, without status epilepticus: Secondary | ICD-10-CM

## 2021-11-26 DIAGNOSIS — S065X0D Traumatic subdural hemorrhage without loss of consciousness, subsequent encounter: Secondary | ICD-10-CM | POA: Diagnosis not present

## 2021-11-26 DIAGNOSIS — G4700 Insomnia, unspecified: Secondary | ICD-10-CM | POA: Diagnosis not present

## 2021-11-26 DIAGNOSIS — M5136 Other intervertebral disc degeneration, lumbar region: Secondary | ICD-10-CM | POA: Diagnosis not present

## 2021-11-26 DIAGNOSIS — F0280 Dementia in other diseases classified elsewhere without behavioral disturbance: Secondary | ICD-10-CM | POA: Diagnosis not present

## 2021-11-26 DIAGNOSIS — I10 Essential (primary) hypertension: Secondary | ICD-10-CM | POA: Diagnosis not present

## 2021-11-26 DIAGNOSIS — E785 Hyperlipidemia, unspecified: Secondary | ICD-10-CM

## 2021-11-26 DIAGNOSIS — Z96641 Presence of right artificial hip joint: Secondary | ICD-10-CM

## 2021-11-26 DIAGNOSIS — M5134 Other intervertebral disc degeneration, thoracic region: Secondary | ICD-10-CM | POA: Diagnosis not present

## 2021-11-26 DIAGNOSIS — Z9181 History of falling: Secondary | ICD-10-CM

## 2021-11-26 DIAGNOSIS — Z9889 Other specified postprocedural states: Secondary | ICD-10-CM

## 2021-11-26 DIAGNOSIS — G3183 Dementia with Lewy bodies: Secondary | ICD-10-CM | POA: Diagnosis not present

## 2021-11-26 DIAGNOSIS — Z9842 Cataract extraction status, left eye: Secondary | ICD-10-CM

## 2021-11-26 DIAGNOSIS — K5900 Constipation, unspecified: Secondary | ICD-10-CM

## 2021-11-26 DIAGNOSIS — M8580 Other specified disorders of bone density and structure, unspecified site: Secondary | ICD-10-CM | POA: Diagnosis not present

## 2021-11-26 DIAGNOSIS — J309 Allergic rhinitis, unspecified: Secondary | ICD-10-CM | POA: Diagnosis not present

## 2021-11-26 DIAGNOSIS — S2231XD Fracture of one rib, right side, subsequent encounter for fracture with routine healing: Secondary | ICD-10-CM | POA: Diagnosis not present

## 2021-11-26 DIAGNOSIS — Z8679 Personal history of other diseases of the circulatory system: Secondary | ICD-10-CM

## 2021-11-30 ENCOUNTER — Ambulatory Visit (HOSPITAL_COMMUNITY): Payer: Medicare Other | Attending: Cardiovascular Disease

## 2021-11-30 ENCOUNTER — Other Ambulatory Visit: Payer: Medicare Other | Admitting: *Deleted

## 2021-11-30 DIAGNOSIS — I10 Essential (primary) hypertension: Secondary | ICD-10-CM

## 2021-11-30 DIAGNOSIS — I5042 Chronic combined systolic (congestive) and diastolic (congestive) heart failure: Secondary | ICD-10-CM | POA: Diagnosis not present

## 2021-11-30 DIAGNOSIS — I2510 Atherosclerotic heart disease of native coronary artery without angina pectoris: Secondary | ICD-10-CM | POA: Insufficient documentation

## 2021-11-30 DIAGNOSIS — I493 Ventricular premature depolarization: Secondary | ICD-10-CM | POA: Insufficient documentation

## 2021-11-30 DIAGNOSIS — S065XAA Traumatic subdural hemorrhage with loss of consciousness status unknown, initial encounter: Secondary | ICD-10-CM | POA: Insufficient documentation

## 2021-11-30 LAB — ECHOCARDIOGRAM COMPLETE
Area-P 1/2: 3.27 cm2
P 1/2 time: 785 msec
S' Lateral: 3.9 cm

## 2021-11-30 LAB — COMPREHENSIVE METABOLIC PANEL
ALT: 17 IU/L (ref 0–44)
AST: 16 IU/L (ref 0–40)
Albumin/Globulin Ratio: 1.5 (ref 1.2–2.2)
Albumin: 4 g/dL (ref 3.7–4.7)
Alkaline Phosphatase: 68 IU/L (ref 44–121)
BUN/Creatinine Ratio: 18 (ref 10–24)
BUN: 16 mg/dL (ref 8–27)
Bilirubin Total: 0.4 mg/dL (ref 0.0–1.2)
CO2: 28 mmol/L (ref 20–29)
Calcium: 9.5 mg/dL (ref 8.6–10.2)
Chloride: 97 mmol/L (ref 96–106)
Creatinine, Ser: 0.91 mg/dL (ref 0.76–1.27)
Globulin, Total: 2.6 g/dL (ref 1.5–4.5)
Glucose: 102 mg/dL — ABNORMAL HIGH (ref 70–99)
Potassium: 4.4 mmol/L (ref 3.5–5.2)
Sodium: 135 mmol/L (ref 134–144)
Total Protein: 6.6 g/dL (ref 6.0–8.5)
eGFR: 87 mL/min/{1.73_m2} (ref >59)

## 2021-11-30 LAB — LIPID PANEL
Chol/HDL Ratio: 2.7 ratio (ref 0.0–5.0)
Cholesterol, Total: 139 mg/dL (ref 100–199)
HDL: 52 mg/dL (ref >39)
LDL Chol Calc (NIH): 71 mg/dL (ref 0–99)
Triglycerides: 81 mg/dL (ref 0–149)
VLDL Cholesterol Cal: 16 mg/dL (ref 5–40)

## 2021-11-30 LAB — CBC
Hematocrit: 40.2 % (ref 37.5–51.0)
Hemoglobin: 13.5 g/dL (ref 13.0–17.7)
MCH: 30.1 pg (ref 26.6–33.0)
MCHC: 33.6 g/dL (ref 31.5–35.7)
MCV: 90 fL (ref 79–97)
Platelets: 181 10*3/uL (ref 150–450)
RBC: 4.49 x10E6/uL (ref 4.14–5.80)
RDW: 12.8 % (ref 11.6–15.4)
WBC: 8.4 10*3/uL (ref 3.4–10.8)

## 2021-11-30 LAB — TSH: TSH: 2.24 u[IU]/mL (ref 0.450–4.500)

## 2021-12-01 DIAGNOSIS — S065X0D Traumatic subdural hemorrhage without loss of consciousness, subsequent encounter: Secondary | ICD-10-CM | POA: Diagnosis not present

## 2021-12-01 DIAGNOSIS — F0280 Dementia in other diseases classified elsewhere without behavioral disturbance: Secondary | ICD-10-CM | POA: Diagnosis not present

## 2021-12-01 DIAGNOSIS — I10 Essential (primary) hypertension: Secondary | ICD-10-CM | POA: Diagnosis not present

## 2021-12-01 DIAGNOSIS — S2231XD Fracture of one rib, right side, subsequent encounter for fracture with routine healing: Secondary | ICD-10-CM | POA: Diagnosis not present

## 2021-12-01 DIAGNOSIS — S72001D Fracture of unspecified part of neck of right femur, subsequent encounter for closed fracture with routine healing: Secondary | ICD-10-CM | POA: Diagnosis not present

## 2021-12-01 DIAGNOSIS — G3183 Dementia with Lewy bodies: Secondary | ICD-10-CM | POA: Diagnosis not present

## 2021-12-02 DIAGNOSIS — S065X0D Traumatic subdural hemorrhage without loss of consciousness, subsequent encounter: Secondary | ICD-10-CM | POA: Diagnosis not present

## 2021-12-02 DIAGNOSIS — S72001D Fracture of unspecified part of neck of right femur, subsequent encounter for closed fracture with routine healing: Secondary | ICD-10-CM | POA: Diagnosis not present

## 2021-12-02 DIAGNOSIS — G3183 Dementia with Lewy bodies: Secondary | ICD-10-CM | POA: Diagnosis not present

## 2021-12-02 DIAGNOSIS — I10 Essential (primary) hypertension: Secondary | ICD-10-CM | POA: Diagnosis not present

## 2021-12-02 DIAGNOSIS — S2231XD Fracture of one rib, right side, subsequent encounter for fracture with routine healing: Secondary | ICD-10-CM | POA: Diagnosis not present

## 2021-12-02 DIAGNOSIS — F0280 Dementia in other diseases classified elsewhere without behavioral disturbance: Secondary | ICD-10-CM | POA: Diagnosis not present

## 2021-12-07 DIAGNOSIS — Z79899 Other long term (current) drug therapy: Secondary | ICD-10-CM | POA: Diagnosis not present

## 2021-12-07 DIAGNOSIS — G3183 Dementia with Lewy bodies: Secondary | ICD-10-CM | POA: Diagnosis not present

## 2021-12-07 DIAGNOSIS — F0283 Dementia in other diseases classified elsewhere, unspecified severity, with mood disturbance: Secondary | ICD-10-CM | POA: Diagnosis not present

## 2021-12-07 DIAGNOSIS — G479 Sleep disorder, unspecified: Secondary | ICD-10-CM | POA: Diagnosis not present

## 2021-12-07 DIAGNOSIS — H532 Diplopia: Secondary | ICD-10-CM | POA: Diagnosis not present

## 2021-12-08 DIAGNOSIS — I10 Essential (primary) hypertension: Secondary | ICD-10-CM | POA: Diagnosis not present

## 2021-12-08 DIAGNOSIS — S2231XD Fracture of one rib, right side, subsequent encounter for fracture with routine healing: Secondary | ICD-10-CM | POA: Diagnosis not present

## 2021-12-08 DIAGNOSIS — S72001D Fracture of unspecified part of neck of right femur, subsequent encounter for closed fracture with routine healing: Secondary | ICD-10-CM | POA: Diagnosis not present

## 2021-12-08 DIAGNOSIS — S065X0D Traumatic subdural hemorrhage without loss of consciousness, subsequent encounter: Secondary | ICD-10-CM | POA: Diagnosis not present

## 2021-12-08 DIAGNOSIS — G3183 Dementia with Lewy bodies: Secondary | ICD-10-CM | POA: Diagnosis not present

## 2021-12-08 DIAGNOSIS — F0280 Dementia in other diseases classified elsewhere without behavioral disturbance: Secondary | ICD-10-CM | POA: Diagnosis not present

## 2021-12-09 DIAGNOSIS — S2231XD Fracture of one rib, right side, subsequent encounter for fracture with routine healing: Secondary | ICD-10-CM | POA: Diagnosis not present

## 2021-12-09 DIAGNOSIS — I10 Essential (primary) hypertension: Secondary | ICD-10-CM | POA: Diagnosis not present

## 2021-12-09 DIAGNOSIS — S065X0D Traumatic subdural hemorrhage without loss of consciousness, subsequent encounter: Secondary | ICD-10-CM | POA: Diagnosis not present

## 2021-12-09 DIAGNOSIS — F0280 Dementia in other diseases classified elsewhere without behavioral disturbance: Secondary | ICD-10-CM | POA: Diagnosis not present

## 2021-12-09 DIAGNOSIS — G3183 Dementia with Lewy bodies: Secondary | ICD-10-CM | POA: Diagnosis not present

## 2021-12-09 DIAGNOSIS — S72001D Fracture of unspecified part of neck of right femur, subsequent encounter for closed fracture with routine healing: Secondary | ICD-10-CM | POA: Diagnosis not present

## 2021-12-10 ENCOUNTER — Telehealth: Payer: Self-pay | Admitting: Internal Medicine

## 2021-12-10 NOTE — Telephone Encounter (Signed)
Left message for patient to call back to schedule Medicare Annual Wellness Visit   Last AWV  05/28/19  Please schedule at anytime with [facility] if patient calls the office back.     Any questions, please call me at [phone]

## 2021-12-12 DIAGNOSIS — Z9181 History of falling: Secondary | ICD-10-CM | POA: Diagnosis not present

## 2021-12-12 DIAGNOSIS — F0280 Dementia in other diseases classified elsewhere without behavioral disturbance: Secondary | ICD-10-CM | POA: Diagnosis not present

## 2021-12-12 DIAGNOSIS — G3183 Dementia with Lewy bodies: Secondary | ICD-10-CM | POA: Diagnosis not present

## 2021-12-12 DIAGNOSIS — J309 Allergic rhinitis, unspecified: Secondary | ICD-10-CM | POA: Diagnosis not present

## 2021-12-12 DIAGNOSIS — Z9841 Cataract extraction status, right eye: Secondary | ICD-10-CM | POA: Diagnosis not present

## 2021-12-12 DIAGNOSIS — S72001D Fracture of unspecified part of neck of right femur, subsequent encounter for closed fracture with routine healing: Secondary | ICD-10-CM | POA: Diagnosis not present

## 2021-12-12 DIAGNOSIS — M8580 Other specified disorders of bone density and structure, unspecified site: Secondary | ICD-10-CM | POA: Diagnosis not present

## 2021-12-12 DIAGNOSIS — K5900 Constipation, unspecified: Secondary | ICD-10-CM | POA: Diagnosis not present

## 2021-12-12 DIAGNOSIS — E785 Hyperlipidemia, unspecified: Secondary | ICD-10-CM | POA: Diagnosis not present

## 2021-12-12 DIAGNOSIS — G4700 Insomnia, unspecified: Secondary | ICD-10-CM | POA: Diagnosis not present

## 2021-12-12 DIAGNOSIS — I10 Essential (primary) hypertension: Secondary | ICD-10-CM | POA: Diagnosis not present

## 2021-12-12 DIAGNOSIS — Z9842 Cataract extraction status, left eye: Secondary | ICD-10-CM | POA: Diagnosis not present

## 2021-12-12 DIAGNOSIS — Z8679 Personal history of other diseases of the circulatory system: Secondary | ICD-10-CM | POA: Diagnosis not present

## 2021-12-12 DIAGNOSIS — Z87891 Personal history of nicotine dependence: Secondary | ICD-10-CM | POA: Diagnosis not present

## 2021-12-12 DIAGNOSIS — M5136 Other intervertebral disc degeneration, lumbar region: Secondary | ICD-10-CM | POA: Diagnosis not present

## 2021-12-12 DIAGNOSIS — M5134 Other intervertebral disc degeneration, thoracic region: Secondary | ICD-10-CM | POA: Diagnosis not present

## 2021-12-12 DIAGNOSIS — Z96641 Presence of right artificial hip joint: Secondary | ICD-10-CM | POA: Diagnosis not present

## 2021-12-12 DIAGNOSIS — G4733 Obstructive sleep apnea (adult) (pediatric): Secondary | ICD-10-CM | POA: Diagnosis not present

## 2021-12-12 DIAGNOSIS — S2231XD Fracture of one rib, right side, subsequent encounter for fracture with routine healing: Secondary | ICD-10-CM | POA: Diagnosis not present

## 2021-12-12 DIAGNOSIS — Z9889 Other specified postprocedural states: Secondary | ICD-10-CM | POA: Diagnosis not present

## 2021-12-12 DIAGNOSIS — G40909 Epilepsy, unspecified, not intractable, without status epilepticus: Secondary | ICD-10-CM | POA: Diagnosis not present

## 2021-12-12 DIAGNOSIS — S065X0D Traumatic subdural hemorrhage without loss of consciousness, subsequent encounter: Secondary | ICD-10-CM | POA: Diagnosis not present

## 2021-12-13 DIAGNOSIS — F0280 Dementia in other diseases classified elsewhere without behavioral disturbance: Secondary | ICD-10-CM | POA: Diagnosis not present

## 2021-12-13 DIAGNOSIS — S2231XD Fracture of one rib, right side, subsequent encounter for fracture with routine healing: Secondary | ICD-10-CM | POA: Diagnosis not present

## 2021-12-13 DIAGNOSIS — S72001D Fracture of unspecified part of neck of right femur, subsequent encounter for closed fracture with routine healing: Secondary | ICD-10-CM | POA: Diagnosis not present

## 2021-12-13 DIAGNOSIS — S065X0D Traumatic subdural hemorrhage without loss of consciousness, subsequent encounter: Secondary | ICD-10-CM | POA: Diagnosis not present

## 2021-12-13 DIAGNOSIS — G3183 Dementia with Lewy bodies: Secondary | ICD-10-CM | POA: Diagnosis not present

## 2021-12-13 DIAGNOSIS — I10 Essential (primary) hypertension: Secondary | ICD-10-CM | POA: Diagnosis not present

## 2021-12-20 DIAGNOSIS — S065X0D Traumatic subdural hemorrhage without loss of consciousness, subsequent encounter: Secondary | ICD-10-CM | POA: Diagnosis not present

## 2021-12-20 DIAGNOSIS — G3183 Dementia with Lewy bodies: Secondary | ICD-10-CM | POA: Diagnosis not present

## 2021-12-20 DIAGNOSIS — I10 Essential (primary) hypertension: Secondary | ICD-10-CM | POA: Diagnosis not present

## 2021-12-20 DIAGNOSIS — S72001D Fracture of unspecified part of neck of right femur, subsequent encounter for closed fracture with routine healing: Secondary | ICD-10-CM | POA: Diagnosis not present

## 2021-12-20 DIAGNOSIS — F0280 Dementia in other diseases classified elsewhere without behavioral disturbance: Secondary | ICD-10-CM | POA: Diagnosis not present

## 2021-12-20 DIAGNOSIS — S2231XD Fracture of one rib, right side, subsequent encounter for fracture with routine healing: Secondary | ICD-10-CM | POA: Diagnosis not present

## 2021-12-21 DIAGNOSIS — Z982 Presence of cerebrospinal fluid drainage device: Secondary | ICD-10-CM | POA: Diagnosis not present

## 2021-12-21 DIAGNOSIS — S065XAD Traumatic subdural hemorrhage with loss of consciousness status unknown, subsequent encounter: Secondary | ICD-10-CM | POA: Diagnosis not present

## 2021-12-21 DIAGNOSIS — S065XAA Traumatic subdural hemorrhage with loss of consciousness status unknown, initial encounter: Secondary | ICD-10-CM | POA: Diagnosis not present

## 2021-12-28 ENCOUNTER — Telehealth: Payer: Self-pay | Admitting: Internal Medicine

## 2021-12-28 DIAGNOSIS — S065X0D Traumatic subdural hemorrhage without loss of consciousness, subsequent encounter: Secondary | ICD-10-CM | POA: Diagnosis not present

## 2021-12-28 DIAGNOSIS — I10 Essential (primary) hypertension: Secondary | ICD-10-CM | POA: Diagnosis not present

## 2021-12-28 DIAGNOSIS — F0280 Dementia in other diseases classified elsewhere without behavioral disturbance: Secondary | ICD-10-CM | POA: Diagnosis not present

## 2021-12-28 DIAGNOSIS — S2231XD Fracture of one rib, right side, subsequent encounter for fracture with routine healing: Secondary | ICD-10-CM | POA: Diagnosis not present

## 2021-12-28 DIAGNOSIS — G3183 Dementia with Lewy bodies: Secondary | ICD-10-CM | POA: Diagnosis not present

## 2021-12-28 DIAGNOSIS — S72001D Fracture of unspecified part of neck of right femur, subsequent encounter for closed fracture with routine healing: Secondary | ICD-10-CM | POA: Diagnosis not present

## 2021-12-28 NOTE — Telephone Encounter (Signed)
error 

## 2022-01-02 ENCOUNTER — Emergency Department (INDEPENDENT_AMBULATORY_CARE_PROVIDER_SITE_OTHER): Payer: Medicare Other

## 2022-01-02 ENCOUNTER — Emergency Department (INDEPENDENT_AMBULATORY_CARE_PROVIDER_SITE_OTHER)
Admission: EM | Admit: 2022-01-02 | Discharge: 2022-01-02 | Disposition: A | Discharge status: Home / Self Care | Payer: Medicare Other | Source: Home / Self Care

## 2022-01-02 ENCOUNTER — Other Ambulatory Visit: Payer: Self-pay

## 2022-01-02 ENCOUNTER — Encounter: Payer: Self-pay | Admitting: Emergency Medicine

## 2022-01-02 DIAGNOSIS — M25561 Pain in right knee: Secondary | ICD-10-CM

## 2022-01-02 DIAGNOSIS — M25551 Pain in right hip: Secondary | ICD-10-CM

## 2022-01-02 DIAGNOSIS — W19XXXA Unspecified fall, initial encounter: Secondary | ICD-10-CM

## 2022-01-02 DIAGNOSIS — M25552 Pain in left hip: Secondary | ICD-10-CM | POA: Diagnosis not present

## 2022-01-02 MED ORDER — HYDROCODONE-ACETAMINOPHEN 5-325 MG PO TABS: 1.0000 | ORAL_TABLET | Freq: Every day | ORAL | 0 refills | Status: DC | PRN

## 2022-01-02 NOTE — Discharge Instructions (Addendum)
Advised patient, wife, and daughter of x-ray results with hard copies provided to patient.  Advised patient may use pain medication sparingly for breakthrough bilateral hip pain.  Advised patient/family if symptoms worsen and/or unresolved please follow-up with hip surgeon or PCP for further evaluation. Advised/encouraged patient to ambulate with four-point rolling walker at all times.

## 2022-01-02 NOTE — ED Triage Notes (Signed)
Patient presents to Urgent Care with complaints of fall on 6/24. Patient wife reports finding him on the floor. Unsure of how he fell. Did have right hip replacement in January. Is currently using walker to help with walking. Taking Tylenol for pain with some relief. Before the fall was able to walk around the house without assistance.

## 2022-01-02 NOTE — ED Provider Notes (Signed)
[facility]    CSN: [id number] Arrival date & time: 01/02/22  0908      History   Chief Complaint Chief Complaint  Patient presents with   Fall   Hip Pain    HPI Jeff Weaver is a 76 y.o. male.   HPI 76 year old male presents with fall on 12/25/21 reports bilateral hip pain and right knee pain sustained during this fall.  Reports right hip replacement in January 2022.  PMH significant for cerebral hemorrhage, CVA, Lewy body dementia, seizures, chronic combined systolic and diastolic heart failure, and SSS s/p cardiac pacemaker in place.  Patient had recent follow-up with neurosurgery for subdural hematoma s/p VP shunt.  Patient is accompanied by his wife and daughter this morning.  Wife reports patient has been working with physical therapy at home with ambulation ADLs, and transfers.  Patient/wife/daughter report excellent ambulation just prior to falling incident where he was ambulating with cane only rather than four-point rolling walker. Presentation today patient ambulates from lobby to furthest exam room with four-point rolling walker well.  Patient sitting comfortably in his wheelchair upon initial evaluation, in no acute pain.  Past Medical History:  Diagnosis Date   Aortic insufficiency    a. mild 2017 echo.   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 s/p clipping at [facility] in 8/13 c/b short-term memory loss, cerebral hemorrhage and seizure disorder s/p VP shunt.   Cerebral hemorrhage (HCC)    Chronic combined systolic and diastolic CHF (congestive heart failure) (HCC)    CVA (cerebral infarction)    Diverticulosis    Enteritis (regional)    Dr Olevia Perches   First degree AV block    GI bleed    HTN (hypertension)    Hyperlipidemia    Hypertensive heart disease    IBS (irritable bowel syndrome)    Lewy body dementia ([place])    Mild CAD    Cardiac cath 01/12/18 showed minimal nonobstructive CAD,  normal LVEF, normal LVEDP.   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   Candida rash of groin 10/01/2021   Intertrigo 10/01/2021   HFrEF (heart failure with reduced ejection fraction) ([place]) 09/18/2021   Blepharitis of upper and lower eyelids of both eyes 09/18/2021   Tinea cruris 09/18/2021   Lewy body dementia ([place]) 09/16/2021   Closed fracture of right hip ([place]) 09/16/2021   S/P hip replacement, right  07/2021 09/16/2021   Vascular dementia ([place]) 04/29/2021   Cough 04/09/2021   Vitamin D deficiency 04/09/2021   Vitamin B1 deficiency 04/09/2021   Right arm pain 01/06/2021   Head injury 11/11/2020   Abrasion of leg with infection, left, initial encounter 10/28/2020   Seizures ([place])    Muscle cramping 12/30/2019   Dizziness 12/30/2019   Unsteady gait 12/30/2019   Pacemaker 11/27/2019   Fatigue  07/09/2019   SSS (sick sinus syndrome) ([place]) 07/02/2018   Bilateral shoulder pain 06/14/2018   Upper airway cough syndrome vs cough variant asthma 03/20/2018   Acute midline low back pain without sciatica 12/25/2017   Depression 10/17/2017   CAD (coronary artery disease), hx of NSTEMI 08/15/2017   Chest pain 07/17/2017   SOBOE (shortness of breath on exertion) 06/08/2017   Bilateral sensorineural hearing loss 06/01/2017   Nasal turbinate hypertrophy 06/01/2017   Dysphagia 05/31/2017   Osteoporosis 01/30/2017   History of cerebral hemorrhage 12/28/2016   Seborrheic dermatitis 07/25/2016    Cognitive communication deficit 06/28/2016   Hypertensive heart disease 06/28/2016   Vitamin B 12 deficiency 03/16/2016   Memory difficulties 03/13/2016   Confusion 03/13/2016   VP (ventriculoperitoneal) shunt status 02/12/2016   Orthostatic hypotension 01/05/2016   Benign prostatic hyperplasia with urinary obstruction 08/04/2014   Urinary urgency 08/04/2014   Gastroesophageal reflux disease 07/14/2014   Polymyalgia rheumatica ([place]) 05/28/2014   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/2014   SBE (subacute bacterial endocarditis) prophylaxis candidate 11/24/2013   Aphasia 07/17/2013   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 seizures, not intractable, without status epilepticus ([place]) 07/17/2013   Subdural hemorrhage ([place]) 07/17/2013   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[place]) 07/17/2013   Pre-diabetes 03/26/2013   Increased frequency of urination 10/31/2012   Nocturia 10/31/2012   History of cerebral aneurysm repair 10/18/2012   History of stroke without residual deficits 10/18/2012   Blepharitis of both eyes 09/27/2012   Ocular rosacea 09/27/2012   Seizure disorder ([place]) 07/23/2012   Entropion 07/10/2012   Hyperopia with astigmatism 07/10/2012   Combined senile cataract 04/13/2012   Presbyopia 04/13/2012   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 artery ([place]) 03/01/2012   Cognitive safety issue 02/20/2012   Nonruptured cerebral aneurysm 11/23/2011   Obstructive sleep apnea syndrome 08/09/2011   Hyperlipidemia 02/03/2009   HTN (hypertension) 02/03/2009   Osteoarthritis 02/03/2009   History of Crohn's disease 04/17/2008    Past Surgical History:  Procedure Laterality Date   BIV PACEMAKER INSERTION CRT-P N/A 03/10/2021   Procedure: BIV PACEMAKER INSERTION CRT-P UPGRADE;  Surgeon: Constance Haw, MD;  Location: [place] [facility];  Service: Cardiovascular;  Laterality: N/A;   CHOLECYSTECTOMY N/A 01/07/2016   Procedure: LAPAROSCOPIC CHOLECYSTECTOMY ;  Surgeon:  Coralie Keens, MD;  Location: [place];  Service: General;  Laterality: N/A;   cns shunt  02/23/12   COLONOSCOPY W/ POLYPECTOMY  1978   negative 2009, Dr Olevia Perches. Due 2014   CRANIOTOMY  02/02/12   Dr Harvel Ricks, [place] of aneurysm   CRANIOTOMY  02-02-12   left pterional craniotomy for clipping complex anterior communicating artery aneurysm    HERNIA REPAIR     LEFT HEART CATH AND CORONARY ANGIOGRAPHY N/A 01/12/2018   Procedure: LEFT HEART CATH AND CORONARY ANGIOGRAPHY;  Surgeon: Martinique, Peter M, MD;  Location: [place] [facility];  Service: Cardiovascular;  Laterality: N/A;   LEFT HEART CATHETERIZATION WITH CORONARY ANGIOGRAM N/A 11/26/2013   Procedure: LEFT HEART CATHETERIZATION WITH CORONARY ANGIOGRAM;  Surgeon: [name], MD;  Location: [facility];  Service: Cardiovascular;  Laterality: N/A;   NOSE SURGERY     PACEMAKER IMPLANT N/A 07/02/2018   Procedure: PACEMAKER IMPLANT;  Surgeon: Constance Haw, MD;  Location: [place] [facility];  Service: Cardiovascular;  Laterality: N/A;   SHOULDER SURGERY  1997   TONSILLECTOMY AND ADENOIDECTOMY     VENTRICULOPERITONEAL SHUNT  02-23-12   INSERTION OF RIGHT FRONTAL VENTRICULOPERITONEAL SHUNT WITH A  CODMAN HAKIM PROGRAMMABLE VALVE       Home Medications    Prior to Admission medications   Medication Sig Start Date End Date Taking? Authorizing Provider  acetaminophen (TYLENOL) 325 MG tablet Take 325-650 mg by mouth every 6 (six) hours as needed (pain/headaches.).   Yes [provider]  cetirizine (ZYRTEC) 10 MG tablet Take 1 tablet (10 mg total) by mouth daily. Patient taking differently: Take 10 mg by mouth at bedtime. 10/26/15  Yes Burns, Claudina Lick, MD  Cholecalciferol (VITAMIN D) 50 MCG (2000 UT) CAPS Take 2,000 Units by mouth in the morning.   Yes [provider]  clonazePAM (KLONOPIN) 1 MG disintegrating tablet Take 1 mg by mouth as directed. ALLOW 1 TABLET TO DISSOLVE IN CHEEK FOR SEIZURES LASTING LONGER THAN 4  MINUTES. OR AFTER 2ND SEIZURE IN 24 HOURS 11/05/19 01/16/23 Yes [provider]  desonide (DESOWEN) 0.05 % ointment Apply 1 application topically 2 (two) times daily as needed (skin irritation).   Yes [provider]  donepezil (ARICEPT) 10 MG tablet Take half a tablet in the morning for a month and then increase to 1 full tablet daily. 05/06/21  Yes   erythromycin ophthalmic ointment Place 1 application. into both eyes at bedtime. 09/17/21  Yes Burns, Claudina Lick, MD  HYDROcodone-acetaminophen (NORCO/VICODIN) 5-325 MG tablet Take 1-2 tablets by mouth daily as needed. 01/02/22  Yes Eliezer Lofts, FNP  LORazepam (ATIVAN) 1 MG tablet Take 1 mg by mouth every 6 (six) hours as needed for seizure.    Yes [provider]  losartan (COZAAR) 25 MG tablet Take 1 tablet (25 mg total) by mouth daily. 09/17/21  Yes Burns, Claudina Lick, MD  metoprolol succinate (TOPROL-XL) 25 MG 24 hr tablet Take 1 tablet (25 mg total) by mouth daily. 09/17/21  Yes Burns, Claudina Lick, MD  Midazolam (NAYZILAM) 5 MG/0.1ML SOLN Place 1 each into the nose as needed (seizure).   Yes [provider]  mirabegron ER (MYRBETRIQ) 50 MG TB24 tablet Take 1 tablet (50 mg total) by mouth daily. Patient taking differently: Take 50 mg by mouth every evening. 02/28/20  Yes Burns, Claudina Lick, MD  NON FORMULARY CPAP Machine: authorized by [facility] in [facility], Historical, MD  nystatin ointment (MYCOSTATIN) Apply 1 application. topically 2 (two) times daily. 09/17/21  Yes Burns, Claudina Lick, MD  omeprazole (PRILOSEC) 40 MG capsule Take 1 capsule (40 mg total) by mouth daily as needed. 06/11/21  Yes Burns, Claudina Lick, MD  Propylene Glycol (SYSTANE BALANCE) 0.6 % SOLN Apply 1 drop to eye daily as needed (dry eyes).   Yes [provider]  pyridOXINE (VITAMIN B-6) 100 MG tablet Take 100 mg by mouth in the morning.   Yes [provider]  Tamsulosin HCl (FLOMAX) 0.4 MG CAPS Take 0.4 mg by mouth every evening.   Yes [provider]  thiamine 100 MG tablet Take 100 mg by mouth in the morning.   Yes [provider]  traZODone (DESYREL) 50 MG tablet Take 1 tablet by mouth at bedtime. 04/29/21  Yes [provider]  triamcinolone cream (KENALOG) 0.5 % Apply 1 application. topically 3 (three) times daily. 10/13/21  Yes Burns, Claudina Lick, MD  VIMPAT 200 MG TABS tablet Take 200 mg by mouth 2 (two) times daily. 09/03/19  Yes [provider]  vitamin B-12 (CYANOCOBALAMIN) 1000 MCG tablet Take 1,000 mcg by mouth in the morning.   Yes [provider]  furosemide (LASIX)  40 MG tablet TAKE 1 TABLET (40 MG TOTAL) BY MOUTH DAILY. Patient taking differently: Take 20 mg by mouth in the morning. 11/16/20 11/16/21  Sueanne Margarita, MD  nitroGLYCERIN (NITROSTAT) 0.4 MG SL tablet PLACE 1 TABLET (0.4 MG TOTAL) UNDER THE TONGUE EVERY 5 (FIVE) MINUTES AS NEEDED FOR CHEST PAIN. Patient taking differently: Place 0.4 mg under the tongue every 5 (five) minutes x 3 doses as needed for chest pain. 08/25/20 08/25/21  Dorothy Spark, MD  rosuvastatin (CRESTOR) 5 MG tablet TAKE 1 TABLET BY MOUTH THREE TIMES A WEEK AND INCREASE AS TOLERATED Patient taking differently: Take 5 mg by mouth every Monday, Wednesday, and Friday. In the evening 11/16/20 11/16/21  Sueanne Margarita, MD    Family History Family History  Problem Relation Age of Onset   COPD Father    Coronary artery disease Father        MI in 12s   Hepatitis Mother    Diabetes Sister    Dementia Sister 89   Diabetes Brother    Colon cancer Neg Hx    Esophageal cancer Neg Hx    Rectal cancer Neg Hx    Stomach cancer Neg Hx    Colon polyps Neg Hx     Social History Social History   Tobacco Use   Smoking status: Former    Packs/day: 0.00    Years: 30.00    Total pack years: 0.00    Types: Pipe, Cigarettes    Quit date: 07/04/2008    Years since quitting: 13.5   Smokeless tobacco: Former  Scientific laboratory technician Use: Never used  Substance Use  Topics   Alcohol use: Yes    Alcohol/week: 0.0 standard drinks of alcohol    Comment: Very Infrequently    Drug use: No     Allergies   Haloperidol, Rosuvastatin, Simvastatin, Lisinopril, Nifedipine, and Pravastatin   Review of Systems Review of Systems  Musculoskeletal:        Bilateral right hip pain and right knee pain secondary to fall sustained on 12/25/2021.  All other systems reviewed and are negative.    Physical Exam Triage Vital Signs ED Triage Vitals  Enc Vitals Group     BP 01/02/22 0948 (!) 142/85     Pulse Rate 01/02/22 0948 60     Resp 01/02/22 0948 18     Temp 01/02/22 0948 98.6 F (37 C)     Temp Source 01/02/22 0948 Oral     SpO2 01/02/22 0948 96 %     Weight --      Height --      Head Circumference --      Peak Flow --      Pain Score 01/02/22 0944 9     Pain Loc --      Pain Edu? --      Excl. in [place]? --    No data found.  Updated Vital Signs BP (!) 142/85 (BP Location: Right Arm)   Pulse 60   Temp 98.6 F (37 C) (Oral)   Resp 18   SpO2 96%   Physical Exam Vitals and nursing note reviewed.  Constitutional:      Appearance: Normal appearance. He is normal weight.  HENT:     Head: Normocephalic and atraumatic.     Mouth/Throat:     Mouth: Mucous membranes are moist.     Pharynx: Oropharynx is clear.  Eyes:     Extraocular Movements: Extraocular movements intact.  Conjunctiva/sclera: Conjunctivae normal.     Pupils: Pupils are equal, round, and reactive to light.  Cardiovascular:     Rate and Rhythm: Normal rate and regular rhythm.     Pulses: Normal pulses.     Heart sounds: Normal heart sounds.  Pulmonary:     Effort: Pulmonary effort is normal.     Breath sounds: Normal breath sounds. No wheezing, rhonchi or rales.  Musculoskeletal:     Cervical back: Normal range of motion and neck supple.     Comments: Bilateral hip, right knee: mildly TTP, limited range of motion  Skin:    General: Skin is warm and dry.  Neurological:      General: No focal deficit present.     Mental Status: He is alert and oriented to person, place, and time. Mental status is at baseline.     Comments: Patient seated comfortably in wheelchair during exam      UC Treatments / Results  Labs (all labs ordered are listed, but only abnormal results are displayed) Labs Reviewed - No data to display  EKG   Radiology DG Knee 1-2 Views Right  Result Date: 01/02/2022 CLINICAL DATA:  Bilateral hip and posterior right knee pain. EXAM: RIGHT KNEE - 1-2 VIEW COMPARISON:  None Available. FINDINGS: The mineralization and alignment are normal. There is no evidence of acute fracture or dislocation. There are tricompartmental degenerative changes with joint space narrowing and osteophytes. There are probable loose bodies posteriorly. No significant joint effusion or focal soft tissue abnormality. IMPRESSION: No acute osseous findings. Tricompartmental degenerative changes with probable posterior intra-articular loose bodies. Electronically Signed   By: Richardean Sale M.D.   On: 01/02/2022 11:15   DG Hips Bilat W or Wo Pelvis 3-4 Views  Result Date: 01/02/2022 CLINICAL DATA:  Fall with bilateral hip pain. Right hip replacement 6 months ago. EXAM: DG HIP (WITH OR WITHOUT PELVIS) 3-4V BILAT COMPARISON:  Right hip radiographs 07/14/2021 FINDINGS: Interval right hip bipolar hemiarthroplasty. The hardware is intact without loosening. The bones appear mildly demineralized. No evidence of acute fracture or dislocation. Stable mild left hip degenerative changes. Stable mild sacroiliac degenerative changes and lower lumbar spondylosis. Postsurgical changes consistent with previous hernia repair and a ventricular peritoneal shunt are noted. IMPRESSION: Interval right hip bipolar hemiarthroplasty. No evidence of acute fracture or dislocation. Electronically Signed   By: Richardean Sale M.D.   On: 01/02/2022 11:14    Procedures Procedures (including critical care  time)  Medications Ordered in UC Medications - No data to display  Initial Impression / Assessment and Plan / UC Course  I have reviewed the triage vital signs and the nursing notes.  Pertinent labs & imaging results that were available during my care of the patient were reviewed by me and considered in my medical decision making (see chart for details).     MDM: 1.  Fall, initial encounter-patient/family report fall at home on 12/25/2021; 2.  Bilateral hip pain-bilateral hip x-ray/pelvis results revealed above, Rx'd Vicodin, as needed; patient transfers extremely well on his own from wheelchair into private vehicle at discharge today.  3.  Acute pain of right knee-right knee x-ray results revealed above, Rx'd Vicodin, as needed. Advised patient, wife, and daughter of x-ray results with hard copies provided to patient.  Advised patient may use pain medication sparingly for breakthrough bilateral hip pain.  Advised patient/family if symptoms worsen and/or unresolved please follow-up with hip surgeon or PCP for further evaluation. Advised/encouraged patient to ambulate with four-point  rolling walker at all times.  Patient discharged home, hemodynamically stable. Final Clinical Impressions(s) / UC Diagnoses   Final diagnoses:  Fall, initial encounter  Bilateral hip pain  Acute pain of right knee     Discharge Instructions      Advised patient, wife, and daughter of x-ray results with hard copies provided to patient.  Advised patient may use pain medication sparingly for breakthrough bilateral hip pain.  Advised patient/family if symptoms worsen and/or unresolved please follow-up with hip surgeon or PCP for further evaluation. Advised/encouraged patient to ambulate with four-point rolling walker at all times.     ED Prescriptions     Medication Sig Dispense Auth. Provider   HYDROcodone-acetaminophen (NORCO/VICODIN) 5-325 MG tablet Take 1-2 tablets by mouth daily as needed. 30 tablet  Eliezer Lofts, FNP      I have reviewed the PDMP during this encounter.   Eliezer Lofts, [place] 01/02/22 1308

## 2022-01-05 ENCOUNTER — Ambulatory Visit (INDEPENDENT_AMBULATORY_CARE_PROVIDER_SITE_OTHER): Payer: Medicare Other | Admitting: Orthopaedic Surgery

## 2022-01-05 DIAGNOSIS — G8929 Other chronic pain: Secondary | ICD-10-CM

## 2022-01-05 DIAGNOSIS — M545 Low back pain, unspecified: Secondary | ICD-10-CM

## 2022-01-05 DIAGNOSIS — M25551 Pain in right hip: Secondary | ICD-10-CM | POA: Insufficient documentation

## 2022-01-05 DIAGNOSIS — I2510 Atherosclerotic heart disease of native coronary artery without angina pectoris: Secondary | ICD-10-CM | POA: Diagnosis not present

## 2022-01-05 MED ORDER — HYDROCODONE-ACETAMINOPHEN 5-325 MG PO TABS: 1.0000 | ORAL_TABLET | Freq: Three times a day (TID) | ORAL | 0 refills | Status: DC | PRN

## 2022-01-05 NOTE — Addendum Note (Signed)
Addended by: Lendon Collar on: 01/05/2022 04:28 PM   Modules accepted: Orders

## 2022-01-05 NOTE — Progress Notes (Signed)
Office Visit Note   Patient: Jeff Weaver           Date of Birth: Dec 18, 1945           MRN: [id number] Visit Date: 01/05/2022              Requested by: [name], MD [place],  [place] [id number] PCP: [name], MD   Assessment & Plan: Visit Diagnoses:  1. Pain in right hip     Plan: Impression is right hip and lower back pain and decreased ability to walk and stand.  I suspect that the problem ultimately a spinal stenosis however I cannot rule out fracture of the lumbar spine or the right hip and therefore we will order a CT scan of the right hip and MRI of the lumbar spine to assess for fracture and spinal stenosis.  I will communicate with him through MyChart about the results.  Total face to face encounter time was greater than 25 minutes and over half of this time was spent in counseling and/or coordination of care.  Follow-Up Instructions: No follow-ups on file.   Orders:  No orders of the defined types were placed in this encounter.  Meds ordered this encounter  Medications   HYDROcodone-acetaminophen (NORCO) 5-325 MG tablet    Sig: Take 1 tablet by mouth 3 (three) times daily as needed.    Dispense:  20 tablet    Refill:  0      Procedures: No procedures performed   Clinical Data: No additional findings.   Subjective: Chief Complaint  Patient presents with   Right Hip - Pain   Right Leg - Pain    HPI Jeff Weaver is a 76 year old gentleman follow-up from the ER status post mechanical fall on 7-23.  X-rays of the right hip were negative.  Has a lot of pain in the right hip and back with any walking.  Uses a rolling walker.  Denies any focal bowel or bladder deficits.  Review of Systems  Constitutional: Negative.   All other systems reviewed and are negative.    Objective: Vital Signs: There were no vitals taken for this visit.  Physical Exam Vitals and nursing note reviewed.  Constitutional:      Appearance: He is  well-developed.  Pulmonary:     Effort: Pulmonary effort is normal.  Abdominal:     Palpations: Abdomen is soft.  Skin:    General: Skin is warm.  Neurological:     Mental Status: He is alert and oriented to person, place, and time.  Psychiatric:        Behavior: Behavior normal.        Thought Content: Thought content normal.        Judgment: Judgment normal.     Ortho Exam Examination right hip shows significant pain with passive range of motion with weightbearing.  Decreased hip flexion strength.  No sciatic tension signs. Specialty Comments:  No specialty comments available.  Imaging: No results found.   PMFS History: Patient Active Problem List   Diagnosis Date Noted   Pain in right hip 01/05/2022   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   [place] rash of groin 10/01/2021   Intertrigo 10/01/2021   HFrEF (heart failure with reduced ejection fraction) ([place]) 09/18/2021   Blepharitis of upper and lower eyelids of both eyes 09/18/2021   Tinea cruris 09/18/2021   Lewy body dementia ([place]) 09/16/2021   Closed fracture of right hip ([place]) 09/16/2021  S/P hip replacement, right  07/2021 09/16/2021   Vascular dementia ([place]) 04/29/2021   Cough 04/09/2021   Vitamin D deficiency 04/09/2021   Vitamin B1 deficiency 04/09/2021   Right arm pain 01/06/2021   Head injury 11/11/2020   Abrasion of leg with infection, left, initial encounter 10/28/2020   Seizures ([place])    Muscle cramping 12/30/2019   Dizziness 12/30/2019   Unsteady gait 12/30/2019   Pacemaker 11/27/2019   Fatigue 07/09/2019   SSS (sick sinus syndrome) ([place]) 07/02/2018   Bilateral shoulder pain 06/14/2018   Upper airway cough syndrome vs cough variant asthma 03/20/2018   Acute midline low back pain without sciatica 12/25/2017   Depression 10/17/2017   CAD (coronary artery disease), hx of NSTEMI 08/15/2017   Chest pain 07/17/2017   SOBOE (shortness of breath on exertion) 06/08/2017   Bilateral sensorineural hearing loss  06/01/2017   Nasal turbinate hypertrophy 06/01/2017   Dysphagia 05/31/2017   Osteoporosis 01/30/2017   History of cerebral hemorrhage 12/28/2016   Seborrheic dermatitis 07/25/2016   Cognitive communication deficit 06/28/2016   Hypertensive heart disease 06/28/2016   Vitamin B 12 deficiency 03/16/2016   Memory difficulties 03/13/2016   Confusion 03/13/2016   VP (ventriculoperitoneal) shunt status 02/12/2016   Orthostatic hypotension 01/05/2016   Benign prostatic hyperplasia with urinary obstruction 08/04/2014   Urinary urgency 08/04/2014   Gastroesophageal reflux disease 07/14/2014   Polymyalgia rheumatica ([place]) 05/28/2014   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/2014   SBE (subacute bacterial endocarditis) prophylaxis candidate 11/24/2013   Aphasia 07/17/2013   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 seizures, not intractable, without status epilepticus ([place]) 07/17/2013   Subdural hemorrhage ([place]) 07/17/2013   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[place]) 07/17/2013   Pre-diabetes 03/26/2013   Increased frequency of urination 10/31/2012   Nocturia 10/31/2012   History of cerebral aneurysm repair 10/18/2012   History of stroke without residual deficits 10/18/2012   Blepharitis of both eyes 09/27/2012   Ocular rosacea 09/27/2012   Seizure disorder ([place]) 07/23/2012   Entropion 07/10/2012   Hyperopia with astigmatism 07/10/2012   Combined senile cataract 04/13/2012   Presbyopia 04/13/2012   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 mild 2017 echo.   Atypical chest pain    Cardiac resynchronization therapy pacemaker  (CRT-P) in place    Cerebral aneurysm    a. s/p clipping at [facility] in 8/13 c/b short-term memory loss, cerebral hemorrhage and seizure disorder s/p VP shunt.   Cerebral hemorrhage (HCC)    Chronic combined systolic and diastolic CHF (congestive heart failure) (HCC)    CVA (cerebral infarction)    Diverticulosis    Enteritis (regional)    Dr Olevia Perches   First degree AV block    GI bleed    HTN (hypertension)    Hyperlipidemia    Hypertensive heart disease    IBS (irritable bowel syndrome)    Lewy body dementia ([place])    Mild CAD    Cardiac cath 01/12/18 showed minimal nonobstructive CAD, normal LVEF, normal LVEDP.   NSVT (nonsustained ventricular tachycardia) (HCC)    Orthostatic hypotension    Pacemaker    Pre-diabetes    PVC's (premature ventricular contractions)  Regional enteritis of large intestine ([place]) since 1978   Rheumatoid arthritis(714.0)    dxed in [employer] in 1980s   Second degree AV block, Mobitz type I    Seizures ([place])    last April 26,2021   Sinus bradycardia    Sleep apnea    SSS (sick sinus syndrome) (HCC)    Subdural hematoma (HCC)     Family History  Problem Relation Age of Onset   COPD Father    Coronary artery disease Father        MI in 64s   Hepatitis Mother    Diabetes Sister    Dementia Sister 31   Diabetes Brother    Colon cancer Neg Hx    Esophageal cancer Neg Hx    Rectal cancer Neg Hx    Stomach cancer Neg Hx    Colon polyps Neg Hx     Past Surgical History:  Procedure Laterality Date   BIV PACEMAKER INSERTION CRT-P N/A 03/10/2021   Procedure: BIV PACEMAKER INSERTION CRT-P UPGRADE;  Surgeon: Constance Haw, MD;  Location: [facility];  Service: Cardiovascular;  Laterality: N/A;   CHOLECYSTECTOMY N/A 01/07/2016   Procedure: LAPAROSCOPIC CHOLECYSTECTOMY ;  Surgeon: Coralie Keens, MD;  Location: [facility];  Service: General;  Laterality: N/A;   cns shunt  02/23/12   COLONOSCOPY W/ POLYPECTOMY  1978   negative 2009, Dr Olevia Perches. Due  2014   CRANIOTOMY  02/02/12   Dr Harvel Ricks, [place] of aneurysm   CRANIOTOMY  02-02-12   left pterional craniotomy for clipping complex anterior communicating artery aneurysm    HERNIA REPAIR     LEFT HEART CATH AND CORONARY ANGIOGRAPHY N/A 01/12/2018   Procedure: LEFT HEART CATH AND CORONARY ANGIOGRAPHY;  Surgeon: Martinique, Peter M, MD;  Location: [facility];  Service: Cardiovascular;  Laterality: N/A;   LEFT HEART CATHETERIZATION WITH CORONARY ANGIOGRAM N/A 11/26/2013   Procedure: LEFT HEART CATHETERIZATION WITH CORONARY ANGIOGRAM;  Surgeon: [name], MD;  Location: [facility];  Service: Cardiovascular;  Laterality: N/A;   NOSE SURGERY     PACEMAKER IMPLANT N/A 07/02/2018   Procedure: PACEMAKER IMPLANT;  Surgeon: Constance Haw, MD;  Location: [facility];  Service: Cardiovascular;  Laterality: N/A;   SHOULDER SURGERY  1997   TONSILLECTOMY AND ADENOIDECTOMY     VENTRICULOPERITONEAL SHUNT  02-23-12   INSERTION OF RIGHT FRONTAL VENTRICULOPERITONEAL SHUNT WITH A CODMAN HAKIM PROGRAMMABLE VALVE   Social History   Occupational History   Occupation: retired/ Education officer, community  Tobacco Use   Smoking status: Former    Packs/day: 0.00    Years: 30.00    Total pack years: 0.00    Types: Pipe, Cigarettes    Quit date: 07/04/2008    Years since quitting: 13.5   Smokeless tobacco: Former  Scientific laboratory technician Use: Never used  Substance and Sexual Activity   Alcohol use: Yes    Alcohol/week: 0.0 standard drinks of alcohol    Comment: Very Infrequently    Drug use: No   Sexual activity: Not Currently

## 2022-01-06 ENCOUNTER — Other Ambulatory Visit: Payer: Medicare Other

## 2022-01-06 DIAGNOSIS — S72001D Fracture of unspecified part of neck of right femur, subsequent encounter for closed fracture with routine healing: Secondary | ICD-10-CM | POA: Diagnosis not present

## 2022-01-06 DIAGNOSIS — S065X0D Traumatic subdural hemorrhage without loss of consciousness, subsequent encounter: Secondary | ICD-10-CM | POA: Diagnosis not present

## 2022-01-06 DIAGNOSIS — F0280 Dementia in other diseases classified elsewhere without behavioral disturbance: Secondary | ICD-10-CM | POA: Diagnosis not present

## 2022-01-06 DIAGNOSIS — I10 Essential (primary) hypertension: Secondary | ICD-10-CM | POA: Diagnosis not present

## 2022-01-06 DIAGNOSIS — S2231XD Fracture of one rib, right side, subsequent encounter for fracture with routine healing: Secondary | ICD-10-CM | POA: Diagnosis not present

## 2022-01-06 DIAGNOSIS — G3183 Dementia with Lewy bodies: Secondary | ICD-10-CM | POA: Diagnosis not present

## 2022-01-07 DIAGNOSIS — F0280 Dementia in other diseases classified elsewhere without behavioral disturbance: Secondary | ICD-10-CM | POA: Diagnosis not present

## 2022-01-07 DIAGNOSIS — S2231XD Fracture of one rib, right side, subsequent encounter for fracture with routine healing: Secondary | ICD-10-CM | POA: Diagnosis not present

## 2022-01-07 DIAGNOSIS — S065X0D Traumatic subdural hemorrhage without loss of consciousness, subsequent encounter: Secondary | ICD-10-CM | POA: Diagnosis not present

## 2022-01-07 DIAGNOSIS — S72001D Fracture of unspecified part of neck of right femur, subsequent encounter for closed fracture with routine healing: Secondary | ICD-10-CM | POA: Diagnosis not present

## 2022-01-07 DIAGNOSIS — I10 Essential (primary) hypertension: Secondary | ICD-10-CM | POA: Diagnosis not present

## 2022-01-07 DIAGNOSIS — G3183 Dementia with Lewy bodies: Secondary | ICD-10-CM | POA: Diagnosis not present

## 2022-01-11 ENCOUNTER — Ambulatory Visit: Payer: Medicare Other | Admitting: Orthopaedic Surgery

## 2022-01-13 ENCOUNTER — Ambulatory Visit
Admission: RE | Admit: 2022-01-13 | Discharge: 2022-01-13 | Disposition: A | Discharge status: Home / Self Care | Payer: Medicare Other | Source: Ambulatory Visit | Attending: Orthopaedic Surgery | Admitting: Orthopaedic Surgery

## 2022-01-13 ENCOUNTER — Telehealth: Payer: Self-pay | Admitting: Orthopaedic Surgery

## 2022-01-13 DIAGNOSIS — M79604 Pain in right leg: Secondary | ICD-10-CM | POA: Diagnosis not present

## 2022-01-13 DIAGNOSIS — M25551 Pain in right hip: Secondary | ICD-10-CM

## 2022-01-13 NOTE — Progress Notes (Signed)
See result.  Looks like MRI still pending.

## 2022-01-24 DIAGNOSIS — R413 Other amnesia: Secondary | ICD-10-CM | POA: Diagnosis not present

## 2022-01-24 DIAGNOSIS — F02A2 Dementia in other diseases classified elsewhere, mild, with psychotic disturbance: Secondary | ICD-10-CM | POA: Diagnosis not present

## 2022-01-24 DIAGNOSIS — G3183 Dementia with Lewy bodies: Secondary | ICD-10-CM | POA: Diagnosis not present

## 2022-01-24 DIAGNOSIS — R41841 Cognitive communication deficit: Secondary | ICD-10-CM | POA: Diagnosis not present

## 2022-01-25 NOTE — Progress Notes (Signed)
Can we check on lumbar spine mri?

## 2022-01-27 DIAGNOSIS — M79671 Pain in right foot: Secondary | ICD-10-CM | POA: Diagnosis not present

## 2022-01-27 DIAGNOSIS — M79672 Pain in left foot: Secondary | ICD-10-CM | POA: Diagnosis not present

## 2022-01-27 DIAGNOSIS — B351 Tinea unguium: Secondary | ICD-10-CM | POA: Diagnosis not present

## 2022-02-01 DIAGNOSIS — F02A2 Dementia in other diseases classified elsewhere, mild, with psychotic disturbance: Secondary | ICD-10-CM | POA: Diagnosis not present

## 2022-02-01 DIAGNOSIS — Z7409 Other reduced mobility: Secondary | ICD-10-CM | POA: Diagnosis not present

## 2022-02-01 DIAGNOSIS — G3183 Dementia with Lewy bodies: Secondary | ICD-10-CM | POA: Diagnosis not present

## 2022-02-01 DIAGNOSIS — Z9181 History of falling: Secondary | ICD-10-CM | POA: Diagnosis not present

## 2022-02-01 DIAGNOSIS — R269 Unspecified abnormalities of gait and mobility: Secondary | ICD-10-CM | POA: Diagnosis not present

## 2022-02-03 DIAGNOSIS — R296 Repeated falls: Secondary | ICD-10-CM | POA: Diagnosis not present

## 2022-02-03 DIAGNOSIS — G40209 Localization-related (focal) (partial) symptomatic epilepsy and epileptic syndromes with complex partial seizures, not intractable, without status epilepticus: Secondary | ICD-10-CM | POA: Diagnosis not present

## 2022-02-03 DIAGNOSIS — M810 Age-related osteoporosis without current pathological fracture: Secondary | ICD-10-CM | POA: Insufficient documentation

## 2022-02-03 DIAGNOSIS — I639 Cerebral infarction, unspecified: Secondary | ICD-10-CM | POA: Diagnosis not present

## 2022-02-06 NOTE — Progress Notes (Signed)
Cardiology Office Note    Date:  02/07/2022   ID:  Jeff Weaver, DOB 1946/01/18, MRN [id number]  PCP:  Jeff Rail, MD  Cardiologist:  Jeff Him, MD  Electrophysiologist:  Jeff Haw, MD   Chief Complaint: f/u CHF  History of Present Illness:   Jeff Weaver is a 76 y.o. 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   He has a long history of chronic atypical chest pain with intermittent evaluations over the years. Last cath was in 01/12/18 showing minimal nonobstructive CAD, normal LVEF, normal LVEDP. He had 2nd degree AVB type 1 during cath prompting monitor 02/2018 which showed profound bradycardia, AVB with frequently nonconducted P waves, frequent PVCs (7%) and short runs of NSVT so underwent PPM implantation. Repeat echo 11/2020 demonstrated a decline in EF to 35%. Nuclear stress test 12/2020 was negative for ischemia. EP felt RV pacing was contributing to systolic dysfunction so underwent upgrade to CRT-P in 03/2021. Though he's done reasonably well from a cardiac standpoint since that time, he has had several complex medical admissions since then. He's had progressive cognitive decline ultimately felt to have Lewy body dementia and ground level fall (07/2021) 2/2 pre-syncope with right hip fracture, rib fracture, subdural hematoma, possible aspiration penumonitis, delirium, and complicated UTI. He underwent hip replacement. His SDH appeared to have been managed conservatively. I also take care of his wife  Jeff Weaver who previously relayed she was concerned during the hospitalization she felt as though the focus was on his dementia and less so on care of his other medical problems. Repeat echo 11/30/21 showed normalization of LVEF to 55-60%, mild LVH, G1DD, normal RV, mildly elevated PASP, mild AI, mild dilation of aortic root, dilated IVC.  He is seen for follow-up today with wife Jeff Weaver and daughter Jeff Weaver. His main complaint is generalized fatigue/decreased energy and poor endurance. He is still working with PT and has been referred to OT over dexterity concerns with his hands as well. No overt new chest pain or SOB reported. He has mild edema of the lower extremities. He is very sedentary. Had a mechanical fall a few weeks ago. When asked about it, he begins to relay the story of when he broke his hip. His family remains very helpful filling in the gaps of how he is doing.   Labwork independently reviewed: 11/2021 TSH wnl, CBC wnl, LDL 71, trig 81, CMET OK glu 102, K 4.4, Cr 0.91   Cardiology Studies:   Studies reviewed are outlined and summarized above. Reports included below if pertinent.   2d echo 11/2021    1. Left ventricular ejection fraction, by estimation, is 55 to 60%. The  left ventricle has normal function. The left ventricle has no regional  wall motion abnormalities. There is mild concentric left ventricular  hypertrophy. Left ventricular diastolic  parameters are consistent with Grade I diastolic dysfunction (impaired  relaxation). The average left ventricular global longitudinal strain is  -21.1 %. The global longitudinal strain is normal.   2.  Right ventricular systolic function is normal. The right ventricular  size is normal. There is mildly elevated pulmonary artery systolic  pressure.   3. The mitral valve is normal in structure. Trivial mitral valve  regurgitation. No evidence of mitral stenosis.   4. The aortic valve is tricuspid. Aortic valve regurgitation is  mild. No  aortic stenosis is present.   5. Aortic dilatation noted. There is mild dilatation of the aortic root,  measuring 39 mm. There is mild dilatation of the ascending aorta,  measuring 37 mm.   6. The inferior vena cava is dilated in size with >50% respiratory  variability, suggesting right atrial pressure of 8 mmHg.     Past Medical History:  Diagnosis Date   Aortic insufficiency    a. mild 2017 echo.   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 s/p clipping at [facility] in 8/13 c/b short-term memory loss, cerebral hemorrhage and seizure disorder s/p VP shunt.   Cerebral hemorrhage (HCC)    Chronic combined systolic and diastolic CHF (congestive heart failure) (HCC)    CVA (cerebral infarction)    Diverticulosis    Enteritis (regional)    Dr Olevia Perches   First degree AV block    GI bleed    HTN (hypertension)    Hyperlipidemia    Hypertensive heart disease    IBS (irritable bowel syndrome)    Lewy body dementia ([place])    Mild CAD    Cardiac cath 01/12/18 showed minimal nonobstructive CAD, normal LVEF, normal LVEDP.   NSVT (nonsustained ventricular tachycardia) (HCC)    Orthostatic hypotension    Pacemaker    Pre-diabetes    PVC's (premature ventricular contractions)    Regional enteritis of large intestine ([place]) since 1978   Rheumatoid arthritis(714.0)    dxed in [employer] in 1980s   Second degree AV block, Mobitz type I    Seizures ([place])    last April 26,2021   Sinus bradycardia    Sleep apnea    SSS (sick sinus syndrome) (HCC)    Subdural hematoma (HCC)     Past Surgical History:  Procedure Laterality Date   BIV PACEMAKER INSERTION CRT-P N/A 03/10/2021   Procedure: BIV PACEMAKER INSERTION CRT-P UPGRADE;  Surgeon: Jeff Haw, MD;  Location: [place] [facility];  Service: Cardiovascular;  Laterality: N/A;   CHOLECYSTECTOMY N/A 01/07/2016   Procedure: LAPAROSCOPIC CHOLECYSTECTOMY ;  Surgeon: Coralie Keens, MD;  Location: [place];  Service: General;  Laterality: N/A;   cns shunt  02/23/12   COLONOSCOPY W/ POLYPECTOMY  1978   negative 2009, Dr Olevia Perches. Due 2014   CRANIOTOMY  02/02/12   Dr Harvel Ricks, [place] of aneurysm   CRANIOTOMY  02-02-12   left pterional craniotomy for clipping complex anterior communicating artery aneurysm    HERNIA REPAIR     LEFT HEART CATH AND CORONARY ANGIOGRAPHY N/A 01/12/2018   Procedure: LEFT HEART CATH AND CORONARY ANGIOGRAPHY;  Surgeon: Martinique, Peter M, MD;  Location: [place] [facility];  Service: Cardiovascular;  Laterality: N/A;   LEFT HEART CATHETERIZATION WITH CORONARY ANGIOGRAM N/A 11/26/2013   Procedure: LEFT HEART CATHETERIZATION WITH CORONARY ANGIOGRAM;  Surgeon: [name], MD;  Location: [facility];  Service: Cardiovascular;  Laterality: N/A;   NOSE SURGERY     PACEMAKER IMPLANT N/A 07/02/2018   Procedure: PACEMAKER IMPLANT;  Surgeon: Jeff Haw, MD;  Location: [place] [facility];  Service: Cardiovascular;  Laterality: N/A;   SHOULDER SURGERY  1997   TONSILLECTOMY AND ADENOIDECTOMY     VENTRICULOPERITONEAL SHUNT  02-23-12   INSERTION OF RIGHT FRONTAL VENTRICULOPERITONEAL SHUNT WITH A CODMAN HAKIM PROGRAMMABLE VALVE    Current Medications: Current Meds  Medication Sig   acetaminophen (TYLENOL) 325 MG tablet Take 325-650 mg by mouth every 6 (six) hours as needed (pain/headaches.).   cetirizine (ZYRTEC) 10 MG tablet Take 1 tablet (10 mg total) by mouth daily.   Cholecalciferol (VITAMIN D) 50 MCG (2000 UT) CAPS Take 2,000 Units by mouth in the morning.   clonazePAM (KLONOPIN) 1 MG disintegrating tablet Take 1 mg by mouth as directed. ALLOW 1 TABLET TO DISSOLVE IN CHEEK FOR SEIZURES LASTING LONGER THAN 4 MINUTES. OR AFTER 2ND SEIZURE IN 24 HOURS   desonide (DESOWEN) 0.05 % ointment Apply 1 application topically 2 (two) times daily as needed (skin irritation).   donepezil (ARICEPT) 10 MG tablet Take half a tablet in the morning for a  month and then increase to 1 full tablet daily.   erythromycin ophthalmic ointment Place 1 application. into both eyes at bedtime.   HYDROcodone-acetaminophen (NORCO) 5-325 MG tablet Take 1 tablet by mouth 3 (three) times daily as needed.   HYDROcodone-acetaminophen (NORCO/VICODIN) 5-325 MG tablet Take 1-2 tablets by mouth daily as needed.   LORazepam (ATIVAN) 1 MG tablet Take 1 mg by mouth every 6 (six) hours as needed for seizure.    losartan (COZAAR) 25 MG tablet Take 1 tablet (25 mg total) by mouth daily.   metoprolol succinate (TOPROL-XL) 25 MG 24 hr tablet Take 1 tablet (25 mg total) by mouth daily.   Midazolam (NAYZILAM) 5 MG/0.1ML SOLN Place 1 each into the nose as needed (seizure).   mirabegron ER (MYRBETRIQ) 50 MG TB24 tablet Take 1 tablet (50 mg total) by mouth daily.   NON FORMULARY CPAP Machine: authorized by [facility] in [place]    nystatin ointment (MYCOSTATIN) Apply 1 application. topically 2 (two) times daily.   omeprazole (PRILOSEC) 40 MG capsule Take 1 capsule (40 mg total) by mouth daily as needed.   Propylene Glycol (SYSTANE BALANCE) 0.6 % SOLN Apply 1 drop to eye daily as needed (dry eyes).   pyridOXINE (VITAMIN B-6) 100 MG tablet Take 100 mg by mouth in the morning.   Tamsulosin HCl (FLOMAX) 0.4 MG CAPS Take 0.4 mg by mouth every evening.   thiamine 100 MG tablet Take 100 mg by mouth in the morning.   traZODone (DESYREL) 50 MG tablet Take 1 tablet by mouth at bedtime.   triamcinolone cream (KENALOG) 0.5 % Apply 1 application. topically 3 (three) times daily.   VIMPAT 200 MG TABS tablet Take 200 mg by mouth 2 (two) times daily.   vitamin B-12 (CYANOCOBALAMIN) 1000 MCG tablet Take 1,000 mcg by mouth in the morning.      Allergies:   Haloperidol, Rosuvastatin, Simvastatin, Lisinopril, Nifedipine, and Pravastatin   Social History   Socioeconomic History   Marital status: Married    Spouse name: Not on file   Number of children: 1   Years of education: Not on file   Highest  education level: Not on file  Occupational History   Occupation: retired/ Education officer, community  Tobacco Use   Smoking status: Former    Packs/day: 0.00    Years: 30.00    Total pack years: 0.00    Types: Pipe, Cigarettes    Quit date: 07/04/2008    Years since quitting: 13.6   Smokeless  tobacco: Former  Scientific laboratory technician Use: Never used  Substance and Sexual Activity   Alcohol use: Yes    Alcohol/week: 0.0 standard drinks of alcohol    Comment: Very Infrequently    Drug use: No   Sexual activity: Not Currently  Other Topics Concern   Not on file  Social History Narrative   Not on file   Social Determinants of Health   Financial Resource Strain: Low Risk  (05/17/2018)   Overall Financial Resource Strain (CARDIA)    Difficulty of Paying Living Expenses: Not hard at all  Food Insecurity: No Food Insecurity (05/17/2018)   Hunger Vital Sign    Worried About Running Out of Food in the Last Year: Never true    Ran Out of Food in the Last Year: Never true  Transportation Needs: No Transportation Needs (05/17/2018)   PRAPARE - Hydrologist (Medical): No    Lack of Transportation (Non-Medical): No  Physical Activity: Insufficiently Active (05/28/2019)   Exercise Vital Sign    Days of Exercise per Week: 3 days    Minutes of Exercise per Session: 30 min  Stress: No Stress Concern Present (05/28/2019)   [place]    Feeling of Stress : Only a little  Social Connections: Unknown (05/28/2019)   Social Connection and Isolation Panel [NHANES]    Frequency of Communication with Friends and Family: Not on file    Frequency of Social Gatherings with Friends and Family: Not on file    Attends Religious Services: Not on Advertising copywriter or Organizations: Not on file    Attends Archivist Meetings: Not on file    Marital Status: Married     Family History:  The patient's family  history includes COPD in his father; Coronary artery disease in his father; Dementia (age of onset: 24) in his sister; Diabetes in his brother and sister; Hepatitis in his mother. There is no history of Colon cancer, Esophageal cancer, Rectal cancer, Stomach cancer, or Colon polyps.  ROS:   Please see the history of present illness. Chronic cough as previously reported. Advised to d/w PCP. All other systems are reviewed and otherwise negative.    EKG(s)/Additional Labs   EKG:  EKG is not ordered today  Recent Labs: 04/09/2021: BNP 100.5 11/30/2021: ALT 17; BUN 16; Creatinine, Ser 0.91; Hemoglobin 13.5; Platelets 181; Potassium 4.4; Sodium 135; TSH 2.240  Recent Lipid Panel    Component Value Date/Time   CHOL 139 11/30/2021 0853   CHOL 159 03/27/2013 0818   TRIG 81 11/30/2021 0853   TRIG 127 03/27/2013 0818   HDL 52 11/30/2021 0853   HDL 45 03/27/2013 0818   CHOLHDL 2.7 11/30/2021 0853   CHOLHDL 3 04/18/2018 1023   VLDL 26.2 04/18/2018 1023   LDLCALC 71 11/30/2021 0853   LDLCALC 89 03/27/2013 0818   LDLDIRECT 87.6 09/19/2012 1206    PHYSICAL EXAM:    VS:  BP 114/60   Pulse 60   Ht 5' 8.5" (1.74 m)   Wt 232 lb (105.2 kg)   SpO2 94%   BMI 34.76 kg/m   BMI: Body mass index is 34.76 kg/m.  GEN: Well nourished, well developed male in no acute distress HEENT: normocephalic, atraumatic Neck: no JVD, carotid bruits, or masses Cardiac: RRR; no murmurs, rubs, or gallops, 1+ BLE edema  Respiratory:  clear to auscultation bilaterally, normal work of breathing  GI: soft, nontender, nondistended, + BS MS: no deformity or atrophy Skin: warm and dry, no rash Neuro:  Alert and Oriented x 3, Strength and sensation are intact, follows commands Psych: euthymic mood, full affect  Wt Readings from Last 3 Encounters:  02/07/22 232 lb (105.2 kg)  11/15/21 230 lb 12.8 oz (104.7 kg)  11/09/21 226 lb (102.5 kg)     ASSESSMENT & PLAN:   1. Chronic combined CHF/presumed NICM - mild edema  noted on exam. He would not likely tolerate much higher doses of diuretics due to frequent urination, also utilizes adult diaper. He's been taking 1/2 tablet of Lasix daily (6m). We will switch to equivalent dose of torsemide doing 157mdaily in the form of 1/2 of a 2017mablet daily to see how he does. Recheck BMET in 1 week. Also discussed elevation, sodium, fluid restriction.   2. Minimal CAD - long history of atypical chest pain, not a major complaint today. Not on ASA due to subdural hematoma. No additional testing needed at this time.  3. Essential HTN - BP controlled, follow with change as above.  4. H/o PVCs - quiescent, continue metoprolol. Exam c/w NSR.  5. Mild dilation of aortic root - consider repeat echocardiogram 11/2022 if appropriate. Can be decided closer to that time.   Disposition: F/u with me in 3-4 months per d/w family.   Medication Adjustments/Labs and Tests Ordered: Current medicines are reviewed at length with the patient today.  Concerns regarding medicines are outlined above. Medication changes, Labs and Tests ordered today are summarized above and listed in the Patient Instructions accessible in Encounters. [facility] forms also filled out today at family's request.  Signed, DayCharlie PitterA-C  02/07/2022 1:33 PM    [place]one: [phone]: [phone]

## 2022-02-07 ENCOUNTER — Encounter: Payer: Self-pay | Admitting: Physician Assistant

## 2022-02-07 ENCOUNTER — Telehealth: Payer: Self-pay

## 2022-02-07 ENCOUNTER — Ambulatory Visit (INDEPENDENT_AMBULATORY_CARE_PROVIDER_SITE_OTHER): Payer: Medicare Other | Admitting: Physician Assistant

## 2022-02-07 VITALS — BP 114/60 | HR 60 | Ht 68.5 in | Wt 232.0 lb

## 2022-02-07 DIAGNOSIS — I10 Essential (primary) hypertension: Secondary | ICD-10-CM

## 2022-02-07 DIAGNOSIS — I5042 Chronic combined systolic (congestive) and diastolic (congestive) heart failure: Secondary | ICD-10-CM

## 2022-02-07 DIAGNOSIS — I77810 Thoracic aortic ectasia: Secondary | ICD-10-CM | POA: Diagnosis not present

## 2022-02-07 DIAGNOSIS — I493 Ventricular premature depolarization: Secondary | ICD-10-CM

## 2022-02-07 DIAGNOSIS — I2510 Atherosclerotic heart disease of native coronary artery without angina pectoris: Secondary | ICD-10-CM

## 2022-02-07 MED ORDER — TORSEMIDE 20 MG PO TABS: 10.0000 mg | ORAL_TABLET | Freq: Every day | ORAL | 1 refills | Status: DC

## 2022-02-07 NOTE — Telephone Encounter (Signed)
Left message on voicemail stating forms has been completed.

## 2022-02-07 NOTE — Patient Instructions (Signed)
Medication Instructions:  STOP Furosemide START Torsemide 54m Take half tablet daily  *If you need a refill on your cardiac medications before your next appointment, please call your pharmacy*   Lab Work: 1[place] If you have labs (blood work) drawn today and your tests are completely normal, you will receive your results only by: [place](if you have MyChart) OR A paper copy in the mail If you have any lab test that is abnormal or we need to change your treatment, we will call you to review the results.   Testing/Procedures: NONE ORDERED   Follow-Up: [facility] and your [facility] needs are our [facility].  As part of our continuing mission to provide you with exceptional heart care, we have created designated Provider Care Teams.  These Care Teams include your primary Cardiologist (physician) and Advanced Practice Providers (APPs -  Physician Assistants and Nurse Practitioners) who all work together to provide you with the care you need, when you need it.  We recommend signing up for the patient portal called "MyChart".  Sign up information is provided on this After Visit Summary.  MyChart is used to connect with patients for Virtual Visits (Telemedicine).  Patients are able to view lab/test results, encounter notes, upcoming appointments, etc.  Non-urgent messages can be sent to your provider as well.   To learn more about what you can do with MyChart, go to [email or web address]    Your next appointment:   3-4 month(s)  The format for your next appointment:   In Person  Provider:   DMelina Copa PA-C        Other Instructions   Important Information About Sugar

## 2022-02-09 ENCOUNTER — Encounter (INDEPENDENT_AMBULATORY_CARE_PROVIDER_SITE_OTHER): Payer: Self-pay

## 2022-02-10 DIAGNOSIS — G3183 Dementia with Lewy bodies: Secondary | ICD-10-CM | POA: Diagnosis not present

## 2022-02-10 DIAGNOSIS — F0280 Dementia in other diseases classified elsewhere without behavioral disturbance: Secondary | ICD-10-CM | POA: Diagnosis not present

## 2022-02-10 DIAGNOSIS — R569 Unspecified convulsions: Secondary | ICD-10-CM | POA: Diagnosis not present

## 2022-02-10 DIAGNOSIS — H532 Diplopia: Secondary | ICD-10-CM | POA: Diagnosis not present

## 2022-02-10 DIAGNOSIS — H53483 Generalized contraction of visual field, bilateral: Secondary | ICD-10-CM | POA: Diagnosis not present

## 2022-02-14 ENCOUNTER — Other Ambulatory Visit: Payer: Medicare Other

## 2022-02-14 DIAGNOSIS — I493 Ventricular premature depolarization: Secondary | ICD-10-CM | POA: Diagnosis not present

## 2022-02-14 DIAGNOSIS — I5042 Chronic combined systolic (congestive) and diastolic (congestive) heart failure: Secondary | ICD-10-CM

## 2022-02-14 DIAGNOSIS — I77810 Thoracic aortic ectasia: Secondary | ICD-10-CM

## 2022-02-14 DIAGNOSIS — I2510 Atherosclerotic heart disease of native coronary artery without angina pectoris: Secondary | ICD-10-CM

## 2022-02-14 DIAGNOSIS — I10 Essential (primary) hypertension: Secondary | ICD-10-CM

## 2022-02-14 LAB — BASIC METABOLIC PANEL
BUN/Creatinine Ratio: 11 (ref 10–24)
BUN: 10 mg/dL (ref 8–27)
CO2: 26 mmol/L (ref 20–29)
Calcium: 9.4 mg/dL (ref 8.6–10.2)
Chloride: 99 mmol/L (ref 96–106)
Creatinine, Ser: 0.92 mg/dL (ref 0.76–1.27)
Glucose: 97 mg/dL (ref 70–99)
Potassium: 4 mmol/L (ref 3.5–5.2)
Sodium: 139 mmol/L (ref 134–144)
eGFR: 86 mL/min/{1.73_m2} (ref >59)

## 2022-02-15 DIAGNOSIS — G3183 Dementia with Lewy bodies: Secondary | ICD-10-CM | POA: Diagnosis not present

## 2022-02-15 DIAGNOSIS — F02A2 Dementia in other diseases classified elsewhere, mild, with psychotic disturbance: Secondary | ICD-10-CM | POA: Diagnosis not present

## 2022-02-15 DIAGNOSIS — R413 Other amnesia: Secondary | ICD-10-CM | POA: Diagnosis not present

## 2022-02-15 DIAGNOSIS — R41841 Cognitive communication deficit: Secondary | ICD-10-CM | POA: Diagnosis not present

## 2022-02-23 DIAGNOSIS — F02A2 Dementia in other diseases classified elsewhere, mild, with psychotic disturbance: Secondary | ICD-10-CM | POA: Diagnosis not present

## 2022-02-23 DIAGNOSIS — R41841 Cognitive communication deficit: Secondary | ICD-10-CM | POA: Diagnosis not present

## 2022-02-23 DIAGNOSIS — G3183 Dementia with Lewy bodies: Secondary | ICD-10-CM | POA: Diagnosis not present

## 2022-02-23 DIAGNOSIS — R413 Other amnesia: Secondary | ICD-10-CM | POA: Diagnosis not present

## 2022-02-25 ENCOUNTER — Ambulatory Visit (INDEPENDENT_AMBULATORY_CARE_PROVIDER_SITE_OTHER): Payer: Medicare Other

## 2022-02-25 DIAGNOSIS — I428 Other cardiomyopathies: Secondary | ICD-10-CM

## 2022-02-25 DIAGNOSIS — I5022 Chronic systolic (congestive) heart failure: Secondary | ICD-10-CM | POA: Diagnosis not present

## 2022-02-25 LAB — CUP PACEART REMOTE DEVICE CHECK
Battery Remaining Longevity: 123 mo
Battery Voltage: 3.02 V
Brady Statistic AP VP Percent: 80.32 %
Brady Statistic AP VS Percent: 0.01 %
Brady Statistic AS VP Percent: 19.33 %
Brady Statistic AS VS Percent: 0.35 %
Brady Statistic RA Percent Paced: 80.44 %
Brady Statistic RV Percent Paced: 99.64 %
Date Time Interrogation Session: 20230824215130
Implantable Lead Implant Date: 20191230
Implantable Lead Implant Date: 20191230
Implantable Lead Implant Date: 20220907
Implantable Lead Location: 753858
Implantable Lead Location: 753859
Implantable Lead Location: 753860
Implantable Lead Model: 4598
Implantable Lead Model: 5076
Implantable Lead Model: 5076
Implantable Pulse Generator Implant Date: 20220907
Lead Channel Impedance Value: 285 Ohm
Lead Channel Impedance Value: 323 Ohm
Lead Channel Impedance Value: 380 Ohm
Lead Channel Impedance Value: 399 Ohm
Lead Channel Impedance Value: 399 Ohm
Lead Channel Impedance Value: 418 Ohm
Lead Channel Impedance Value: 456 Ohm
Lead Channel Impedance Value: 513 Ohm
Lead Channel Impedance Value: 627 Ohm
Lead Channel Impedance Value: 627 Ohm
Lead Channel Impedance Value: 646 Ohm
Lead Channel Impedance Value: 760 Ohm
Lead Channel Impedance Value: 779 Ohm
Lead Channel Impedance Value: 779 Ohm
Lead Channel Pacing Threshold Amplitude: 0.625 V
Lead Channel Pacing Threshold Amplitude: 0.625 V
Lead Channel Pacing Threshold Amplitude: 1 V
Lead Channel Pacing Threshold Pulse Width: 0.4 ms
Lead Channel Pacing Threshold Pulse Width: 0.4 ms
Lead Channel Pacing Threshold Pulse Width: 0.4 ms
Lead Channel Sensing Intrinsic Amplitude: 2 mV
Lead Channel Sensing Intrinsic Amplitude: 2 mV
Lead Channel Sensing Intrinsic Amplitude: 20.625 mV
Lead Channel Sensing Intrinsic Amplitude: 20.625 mV
Lead Channel Setting Pacing Amplitude: 1.25 V
Lead Channel Setting Pacing Amplitude: 1.5 V
Lead Channel Setting Pacing Amplitude: 2.25 V
Lead Channel Setting Pacing Pulse Width: 0.4 ms
Lead Channel Setting Pacing Pulse Width: 0.4 ms
Lead Channel Setting Sensing Sensitivity: 1.2 mV

## 2022-03-02 DIAGNOSIS — R413 Other amnesia: Secondary | ICD-10-CM | POA: Diagnosis not present

## 2022-03-02 DIAGNOSIS — G3183 Dementia with Lewy bodies: Secondary | ICD-10-CM | POA: Diagnosis not present

## 2022-03-02 DIAGNOSIS — F02A2 Dementia in other diseases classified elsewhere, mild, with psychotic disturbance: Secondary | ICD-10-CM | POA: Diagnosis not present

## 2022-03-02 DIAGNOSIS — R41841 Cognitive communication deficit: Secondary | ICD-10-CM | POA: Diagnosis not present

## 2022-03-08 DIAGNOSIS — G3183 Dementia with Lewy bodies: Secondary | ICD-10-CM | POA: Diagnosis not present

## 2022-03-08 DIAGNOSIS — F02A2 Dementia in other diseases classified elsewhere, mild, with psychotic disturbance: Secondary | ICD-10-CM | POA: Diagnosis not present

## 2022-03-08 DIAGNOSIS — Z9181 History of falling: Secondary | ICD-10-CM | POA: Diagnosis not present

## 2022-03-08 DIAGNOSIS — Z7409 Other reduced mobility: Secondary | ICD-10-CM | POA: Diagnosis not present

## 2022-03-08 DIAGNOSIS — R269 Unspecified abnormalities of gait and mobility: Secondary | ICD-10-CM | POA: Diagnosis not present

## 2022-03-09 ENCOUNTER — Ambulatory Visit (HOSPITAL_COMMUNITY)
Admission: RE | Admit: 2022-03-09 | Discharge: 2022-03-09 | Disposition: A | Discharge status: Home / Self Care | Payer: Medicare Other | Source: Ambulatory Visit | Attending: Orthopaedic Surgery | Admitting: Orthopaedic Surgery

## 2022-03-09 DIAGNOSIS — Z4541 Encounter for adjustment and management of cerebrospinal fluid drainage device: Secondary | ICD-10-CM | POA: Diagnosis not present

## 2022-03-09 DIAGNOSIS — M545 Low back pain, unspecified: Secondary | ICD-10-CM | POA: Diagnosis not present

## 2022-03-09 DIAGNOSIS — G8929 Other chronic pain: Secondary | ICD-10-CM | POA: Diagnosis not present

## 2022-03-09 DIAGNOSIS — Z8679 Personal history of other diseases of the circulatory system: Secondary | ICD-10-CM | POA: Diagnosis not present

## 2022-03-09 DIAGNOSIS — M549 Dorsalgia, unspecified: Secondary | ICD-10-CM | POA: Diagnosis not present

## 2022-03-09 DIAGNOSIS — G912 (Idiopathic) normal pressure hydrocephalus: Secondary | ICD-10-CM | POA: Diagnosis not present

## 2022-03-09 DIAGNOSIS — S32012A Unstable burst fracture of first lumbar vertebra, initial encounter for closed fracture: Secondary | ICD-10-CM | POA: Diagnosis not present

## 2022-03-09 NOTE — Progress Notes (Signed)
[facility] today at [facility] for [facility] lumbar spine wo contrast. Patient has medtronic device. CLE sent. Orders received from [facility] for DOO 75. Will re-program once scan is completed.

## 2022-03-10 ENCOUNTER — Other Ambulatory Visit: Payer: Self-pay

## 2022-03-10 DIAGNOSIS — G8929 Other chronic pain: Secondary | ICD-10-CM

## 2022-03-10 NOTE — Progress Notes (Signed)
Needs urgent referral to [facility] for L1 burst fracture please.  Thanks.

## 2022-03-11 DIAGNOSIS — R269 Unspecified abnormalities of gait and mobility: Secondary | ICD-10-CM | POA: Diagnosis not present

## 2022-03-11 DIAGNOSIS — F02A2 Dementia in other diseases classified elsewhere, mild, with psychotic disturbance: Secondary | ICD-10-CM | POA: Diagnosis not present

## 2022-03-11 DIAGNOSIS — Z7409 Other reduced mobility: Secondary | ICD-10-CM | POA: Diagnosis not present

## 2022-03-11 DIAGNOSIS — Z9181 History of falling: Secondary | ICD-10-CM | POA: Diagnosis not present

## 2022-03-11 DIAGNOSIS — G3183 Dementia with Lewy bodies: Secondary | ICD-10-CM | POA: Diagnosis not present

## 2022-03-15 DIAGNOSIS — R269 Unspecified abnormalities of gait and mobility: Secondary | ICD-10-CM | POA: Diagnosis not present

## 2022-03-15 DIAGNOSIS — G3183 Dementia with Lewy bodies: Secondary | ICD-10-CM | POA: Diagnosis not present

## 2022-03-15 DIAGNOSIS — Z789 Other specified health status: Secondary | ICD-10-CM | POA: Diagnosis not present

## 2022-03-15 DIAGNOSIS — R2681 Unsteadiness on feet: Secondary | ICD-10-CM | POA: Diagnosis not present

## 2022-03-15 DIAGNOSIS — R4189 Other symptoms and signs involving cognitive functions and awareness: Secondary | ICD-10-CM | POA: Diagnosis not present

## 2022-03-15 DIAGNOSIS — F02A2 Dementia in other diseases classified elsewhere, mild, with psychotic disturbance: Secondary | ICD-10-CM | POA: Diagnosis not present

## 2022-03-15 DIAGNOSIS — Z8679 Personal history of other diseases of the circulatory system: Secondary | ICD-10-CM | POA: Diagnosis not present

## 2022-03-15 DIAGNOSIS — R413 Other amnesia: Secondary | ICD-10-CM | POA: Diagnosis not present

## 2022-03-15 DIAGNOSIS — Z8673 Personal history of transient ischemic attack (TIA), and cerebral infarction without residual deficits: Secondary | ICD-10-CM | POA: Diagnosis not present

## 2022-03-15 DIAGNOSIS — Z7409 Other reduced mobility: Secondary | ICD-10-CM | POA: Diagnosis not present

## 2022-03-15 DIAGNOSIS — R419 Unspecified symptoms and signs involving cognitive functions and awareness: Secondary | ICD-10-CM | POA: Diagnosis not present

## 2022-03-15 DIAGNOSIS — Z9181 History of falling: Secondary | ICD-10-CM | POA: Diagnosis not present

## 2022-03-15 DIAGNOSIS — R279 Unspecified lack of coordination: Secondary | ICD-10-CM | POA: Diagnosis not present

## 2022-03-16 DIAGNOSIS — G912 (Idiopathic) normal pressure hydrocephalus: Secondary | ICD-10-CM | POA: Diagnosis not present

## 2022-03-16 DIAGNOSIS — S065XAA Traumatic subdural hemorrhage with loss of consciousness status unknown, initial encounter: Secondary | ICD-10-CM | POA: Diagnosis not present

## 2022-03-16 DIAGNOSIS — Z982 Presence of cerebrospinal fluid drainage device: Secondary | ICD-10-CM | POA: Diagnosis not present

## 2022-03-18 DIAGNOSIS — G3183 Dementia with Lewy bodies: Secondary | ICD-10-CM | POA: Diagnosis not present

## 2022-03-18 DIAGNOSIS — Z9181 History of falling: Secondary | ICD-10-CM | POA: Diagnosis not present

## 2022-03-18 DIAGNOSIS — R269 Unspecified abnormalities of gait and mobility: Secondary | ICD-10-CM | POA: Diagnosis not present

## 2022-03-18 DIAGNOSIS — F02A2 Dementia in other diseases classified elsewhere, mild, with psychotic disturbance: Secondary | ICD-10-CM | POA: Diagnosis not present

## 2022-03-18 DIAGNOSIS — Z7409 Other reduced mobility: Secondary | ICD-10-CM | POA: Diagnosis not present

## 2022-03-20 NOTE — Progress Notes (Unsigned)
Subjective:    Patient ID: Jeff Weaver, male    DOB: 05-24-1946, 76 y.o.   MRN: [id number]     HPI Jeff Weaver is here for follow up of his chronic medical problems, including htn, lewey body dementia, HFrEF, intertrigo in groin area.  He is here with his wife who supplements the history.  Doing PT.   He is using a walker.  Can not stand long periods of time - back pain.  He can not walk long periods of time.     Had shunt adjusted - urinating less  Blood pressure - well controlled    Eating regular diet.    Cough - gotten worse, has had a cough x long time.  He does have postnasal drip and runny nose which are both chronic.  He does state some jaw pain-?  TMJ.  He has chronic shortness of breath-does not appear to be worse. Wheezing.  He did have a swallow evaluation earlier this year.  The cough does not seem to be related to eating.  Medications and allergies reviewed with patient and updated if appropriate.  Current Outpatient Medications on File Prior to Visit  Medication Sig Dispense Refill   acetaminophen (TYLENOL) 325 MG tablet Take 325-650 mg by mouth every 6 (six) hours as needed (pain/headaches.).     cetirizine (ZYRTEC) 10 MG tablet Take 1 tablet (10 mg total) by mouth daily. 30 tablet 11   Cholecalciferol (VITAMIN D) 50 MCG (2000 UT) CAPS Take 2,000 Units by mouth in the morning.     clonazePAM (KLONOPIN) 1 MG disintegrating tablet Take 1 mg by mouth as directed. ALLOW 1 TABLET TO DISSOLVE IN CHEEK FOR SEIZURES LASTING LONGER THAN 4 MINUTES. OR AFTER 2ND SEIZURE IN 24 HOURS     donepezil (ARICEPT) 10 MG tablet Take half a tablet in the morning for a month and then increase to 1 full tablet daily. 30 tablet 5   HYDROcodone-acetaminophen (NORCO) 5-325 MG tablet Take 1 tablet by mouth 3 (three) times daily as needed. 20 tablet 0   levETIRAcetam (KEPPRA) 500 MG tablet Take 3.5 tablets by mouth in the morning and at bedtime.     LORazepam (ATIVAN) 1 MG tablet Take 1 mg by  mouth every 6 (six) hours as needed for seizure.      losartan (COZAAR) 25 MG tablet Take 1 tablet (25 mg total) by mouth daily. 90 tablet 3   metoprolol succinate (TOPROL-XL) 25 MG 24 hr tablet Take 1 tablet (25 mg total) by mouth daily. 90 tablet 3   Midazolam (NAYZILAM) 5 MG/0.1ML SOLN Place 1 each into the nose as needed (seizure).     mirabegron ER (MYRBETRIQ) 50 MG TB24 tablet Take 1 tablet (50 mg total) by mouth daily. 90 tablet 1   NON FORMULARY CPAP Machine: authorized by [facility] in [place]      nystatin ointment (MYCOSTATIN) Apply 1 application. topically 2 (two) times daily. 30 g 3   Propylene Glycol (SYSTANE BALANCE) 0.6 % SOLN Apply 1 drop to eye daily as needed (dry eyes).     pyridOXINE (VITAMIN B-6) 100 MG tablet Take 100 mg by mouth in the morning.     Tamsulosin HCl (FLOMAX) 0.4 MG CAPS Take 0.4 mg by mouth every evening.     thiamine 100 MG tablet Take 100 mg by mouth in the morning.     torsemide (DEMADEX) 20 MG tablet Take 0.5 tablets (10 mg total) by mouth daily. 45 tablet  1   traZODone (DESYREL) 50 MG tablet Take 1 tablet by mouth at bedtime.     triamcinolone cream (KENALOG) 0.5 % Apply 1 application. topically 3 (three) times daily. 60 g 1   VIMPAT 200 MG TABS tablet Take 200 mg by mouth 2 (two) times daily.     vitamin B-12 (CYANOCOBALAMIN) 1000 MCG tablet Take 1,000 mcg by mouth in the morning.     nitroGLYCERIN (NITROSTAT) 0.4 MG SL tablet PLACE 1 TABLET (0.4 MG TOTAL) UNDER THE TONGUE EVERY 5 (FIVE) MINUTES AS NEEDED FOR CHEST PAIN. (Patient taking differently: Place 0.4 mg under the tongue every 5 (five) minutes x 3 doses as needed for chest pain.) 25 tablet 7   rosuvastatin (CRESTOR) 5 MG tablet TAKE 1 TABLET BY MOUTH THREE TIMES A WEEK AND INCREASE AS TOLERATED (Patient taking differently: Take 5 mg by mouth every Monday, Wednesday, and Friday. In the evening) 90 tablet 3   No current facility-administered medications on file prior to visit.     Review of Systems   Constitutional:  Negative for fever.  HENT:  Positive for congestion (clear), rhinorrhea and trouble swallowing. Negative for ear pain, sinus pressure, sinus pain and sore throat.   Respiratory:  Positive for cough (more in evening - hard time stopping - occ yellow sputum) and shortness of breath (chronic - no change). Negative for wheezing.   Cardiovascular:  Positive for leg swelling. Negative for chest pain and palpitations.  Gastrointestinal:        Jeff Weaver  Neurological:  Positive for headaches (occ).       Objective:   Vitals:   03/21/22 1115  BP: 130/74  Pulse: 70  Temp: 98 F (36.7 C)  SpO2: 97%   BP Readings from Last 3 Encounters:  03/21/22 130/74  02/07/22 114/60  01/02/22 (!) 142/85   Wt Readings from Last 3 Encounters:  03/21/22 237 lb (107.5 kg)  02/07/22 232 lb (105.2 kg)  11/15/21 230 lb 12.8 oz (104.7 kg)   Body mass index is 35.51 kg/m.    Physical Exam Constitutional:      General: He is not in acute distress.    Appearance: Normal appearance. He is not ill-appearing.  HENT:     Head: Normocephalic.     Mouth/Throat:     Mouth: Mucous membranes are moist.     Pharynx: No oropharyngeal exudate or posterior oropharyngeal erythema.  Eyes:     Conjunctiva/sclera: Conjunctivae normal.  Cardiovascular:     Rate and Rhythm: Normal rate and regular rhythm.  Pulmonary:     Effort: Pulmonary effort is normal. No respiratory distress.     Breath sounds: Normal breath sounds. No wheezing or rales.  Musculoskeletal:     Cervical back: Neck supple. No tenderness.     Right lower leg: Edema present.     Left lower leg: Edema present.  Lymphadenopathy:     Cervical: No cervical adenopathy.  Skin:    General: Skin is warm and dry.     Findings: No rash.  Neurological:     Mental Status: He is alert.        Lab Results  Component Value Date   WBC 8.4 11/30/2021   HGB 13.5 11/30/2021   HCT 40.2 11/30/2021   PLT 181 11/30/2021   GLUCOSE 97  02/14/2022   CHOL 139 11/30/2021   TRIG 81 11/30/2021   HDL 52 11/30/2021   LDLDIRECT 87.6 09/19/2012   LDLCALC 71 11/30/2021   ALT 17  11/30/2021   AST 16 11/30/2021   NA 139 02/14/2022   K 4.0 02/14/2022   CL 99 02/14/2022   CREATININE 0.92 02/14/2022   BUN 10 02/14/2022   CO2 26 02/14/2022   TSH 2.240 11/30/2021   INR 1.0 07/17/2017   HGBA1C 6.3 (H) 04/09/2021   MICROALBUR 1.0 11/13/2014     Assessment & Plan:    See Problem List for Assessment and Plan of chronic medical problems.

## 2022-03-21 ENCOUNTER — Ambulatory Visit (INDEPENDENT_AMBULATORY_CARE_PROVIDER_SITE_OTHER): Payer: Medicare Other | Admitting: Internal Medicine

## 2022-03-21 ENCOUNTER — Other Ambulatory Visit: Payer: Self-pay | Admitting: Internal Medicine

## 2022-03-21 ENCOUNTER — Encounter: Payer: Self-pay | Admitting: Internal Medicine

## 2022-03-21 ENCOUNTER — Ambulatory Visit (INDEPENDENT_AMBULATORY_CARE_PROVIDER_SITE_OTHER): Payer: Medicare Other

## 2022-03-21 VITALS — BP 130/74 | HR 70 | Temp 98.0°F | Ht 68.5 in | Wt 237.0 lb

## 2022-03-21 DIAGNOSIS — I2510 Atherosclerotic heart disease of native coronary artery without angina pectoris: Secondary | ICD-10-CM

## 2022-03-21 DIAGNOSIS — E7800 Pure hypercholesterolemia, unspecified: Secondary | ICD-10-CM | POA: Diagnosis not present

## 2022-03-21 DIAGNOSIS — R052 Subacute cough: Secondary | ICD-10-CM

## 2022-03-21 DIAGNOSIS — E538 Deficiency of other specified B group vitamins: Secondary | ICD-10-CM

## 2022-03-21 DIAGNOSIS — E559 Vitamin D deficiency, unspecified: Secondary | ICD-10-CM

## 2022-03-21 DIAGNOSIS — I10 Essential (primary) hypertension: Secondary | ICD-10-CM | POA: Diagnosis not present

## 2022-03-21 DIAGNOSIS — J9811 Atelectasis: Secondary | ICD-10-CM | POA: Diagnosis not present

## 2022-03-21 DIAGNOSIS — B3789 Other sites of candidiasis: Secondary | ICD-10-CM

## 2022-03-21 DIAGNOSIS — L304 Erythema intertrigo: Secondary | ICD-10-CM | POA: Diagnosis not present

## 2022-03-21 DIAGNOSIS — R7303 Prediabetes: Secondary | ICD-10-CM | POA: Diagnosis not present

## 2022-03-21 DIAGNOSIS — M810 Age-related osteoporosis without current pathological fracture: Secondary | ICD-10-CM

## 2022-03-21 DIAGNOSIS — Z23 Encounter for immunization: Secondary | ICD-10-CM | POA: Diagnosis not present

## 2022-03-21 DIAGNOSIS — J811 Chronic pulmonary edema: Secondary | ICD-10-CM | POA: Diagnosis not present

## 2022-03-21 LAB — COMPREHENSIVE METABOLIC PANEL
ALT: 29 U/L (ref 0–53)
AST: 21 U/L (ref 0–37)
Albumin: 4 g/dL (ref 3.5–5.2)
Alkaline Phosphatase: 55 U/L (ref 39–117)
BUN: 15 mg/dL (ref 6–23)
CO2: 30 mEq/L (ref 19–32)
Calcium: 9.6 mg/dL (ref 8.4–10.5)
Chloride: 100 mEq/L (ref 96–112)
Creatinine, Ser: 0.93 mg/dL (ref 0.40–1.50)
GFR: 79.02 mL/min (ref >60.00)
Glucose, Bld: 105 mg/dL — ABNORMAL HIGH (ref 70–99)
Potassium: 3.8 mEq/L (ref 3.5–5.1)
Sodium: 138 mEq/L (ref 135–145)
Total Bilirubin: 0.4 mg/dL (ref 0.2–1.2)
Total Protein: 7.3 g/dL (ref 6.0–8.3)

## 2022-03-21 LAB — LIPID PANEL
Cholesterol: 135 mg/dL (ref 0–200)
HDL: 49.5 mg/dL (ref >39.00)
LDL Cholesterol: 65 mg/dL (ref 0–99)
NonHDL: 85.8
Total CHOL/HDL Ratio: 3
Triglycerides: 103 mg/dL (ref 0.0–149.0)
VLDL: 20.6 mg/dL (ref 0.0–40.0)

## 2022-03-21 LAB — CBC WITH DIFFERENTIAL/PLATELET
Basophils Absolute: 0 10*3/uL (ref 0.0–0.1)
Basophils Relative: 0.4 % (ref 0.0–3.0)
Eosinophils Absolute: 0.3 10*3/uL (ref 0.0–0.7)
Eosinophils Relative: 3.7 % (ref 0.0–5.0)
HCT: 41 % (ref 39.0–52.0)
Hemoglobin: 14 g/dL (ref 13.0–17.0)
Lymphocytes Relative: 21.3 % (ref 12.0–46.0)
Lymphs Abs: 1.6 10*3/uL (ref 0.7–4.0)
MCHC: 34 g/dL (ref 30.0–36.0)
MCV: 93.3 fl (ref 78.0–100.0)
Monocytes Absolute: 0.4 10*3/uL (ref 0.1–1.0)
Monocytes Relative: 5.8 % (ref 3.0–12.0)
Neutro Abs: 5.3 10*3/uL (ref 1.4–7.7)
Neutrophils Relative %: 68.8 % (ref 43.0–77.0)
Platelets: 172 10*3/uL (ref 150.0–400.0)
RBC: 4.4 Mil/uL (ref 4.22–5.81)
RDW: 14 % (ref 11.5–15.5)
WBC: 7.7 10*3/uL (ref 4.0–10.5)

## 2022-03-21 LAB — VITAMIN D 25 HYDROXY (VIT D DEFICIENCY, FRACTURES): VITD: 32.42 ng/mL (ref 30.00–100.00)

## 2022-03-21 LAB — VITAMIN B12: Vitamin B-12: 1167 pg/mL — ABNORMAL HIGH (ref 211–911)

## 2022-03-21 LAB — HEMOGLOBIN A1C: Hgb A1c MFr Bld: 5.9 % (ref 4.6–6.5)

## 2022-03-21 NOTE — Assessment & Plan Note (Signed)
Chronic Check A1c Stressed that he needs to make changes in his diet, but with his memory that will be difficult-decrease or stop all Coke and chocolate

## 2022-03-21 NOTE — Progress Notes (Signed)
Remote pacemaker transmission.   

## 2022-03-21 NOTE — Assessment & Plan Note (Signed)
Chronic Will be starting reclast via [facility]

## 2022-03-21 NOTE — Assessment & Plan Note (Signed)
Chronic ?Check B12 level ?

## 2022-03-21 NOTE — Assessment & Plan Note (Signed)
Chronic Check lipid panel, CMP Continue Crestor 5 mg 3 times weekly

## 2022-03-21 NOTE — Patient Instructions (Addendum)
    Flu vaccine today.     Blood work was ordered.  Have a chest xray today    Medications changes include :   none     Return in about 6 months (around 09/19/2022) for follow up.

## 2022-03-21 NOTE — Assessment & Plan Note (Signed)
Chronic Has had a cough for a while-we discussed this 6 months ago Has postnasal drip, runny nose and GERD-discussed all of those could cause it No longer taking omeprazole-he would not take it Discussed stopping all Coke and chocolate We will get chest x-ray to rule out infection Continue Zyrtec

## 2022-03-21 NOTE — Assessment & Plan Note (Signed)
Chronic Intermittent Continue triamcinolone and/or Mycostatin

## 2022-03-21 NOTE — Assessment & Plan Note (Signed)
Chronic Taking vitamin D daily Check vitamin D level

## 2022-03-21 NOTE — Assessment & Plan Note (Signed)
Chronic BP well controlled Continue losartan 25 mg daily, metoprolol xl 25 mg daily, torsemide 10 mg daily

## 2022-03-25 DIAGNOSIS — R269 Unspecified abnormalities of gait and mobility: Secondary | ICD-10-CM | POA: Diagnosis not present

## 2022-03-25 DIAGNOSIS — G3183 Dementia with Lewy bodies: Secondary | ICD-10-CM | POA: Diagnosis not present

## 2022-03-25 DIAGNOSIS — F02A2 Dementia in other diseases classified elsewhere, mild, with psychotic disturbance: Secondary | ICD-10-CM | POA: Diagnosis not present

## 2022-03-25 DIAGNOSIS — Z7409 Other reduced mobility: Secondary | ICD-10-CM | POA: Diagnosis not present

## 2022-03-25 DIAGNOSIS — Z9181 History of falling: Secondary | ICD-10-CM | POA: Diagnosis not present

## 2022-03-29 DIAGNOSIS — Z7409 Other reduced mobility: Secondary | ICD-10-CM | POA: Diagnosis not present

## 2022-03-29 DIAGNOSIS — R269 Unspecified abnormalities of gait and mobility: Secondary | ICD-10-CM | POA: Diagnosis not present

## 2022-03-29 DIAGNOSIS — Z9181 History of falling: Secondary | ICD-10-CM | POA: Diagnosis not present

## 2022-03-29 DIAGNOSIS — G3183 Dementia with Lewy bodies: Secondary | ICD-10-CM | POA: Diagnosis not present

## 2022-03-29 DIAGNOSIS — F02A2 Dementia in other diseases classified elsewhere, mild, with psychotic disturbance: Secondary | ICD-10-CM | POA: Diagnosis not present

## 2022-03-31 DIAGNOSIS — B351 Tinea unguium: Secondary | ICD-10-CM | POA: Diagnosis not present

## 2022-03-31 DIAGNOSIS — M79672 Pain in left foot: Secondary | ICD-10-CM | POA: Diagnosis not present

## 2022-03-31 DIAGNOSIS — M79671 Pain in right foot: Secondary | ICD-10-CM | POA: Diagnosis not present

## 2022-04-05 DIAGNOSIS — F02A2 Dementia in other diseases classified elsewhere, mild, with psychotic disturbance: Secondary | ICD-10-CM | POA: Diagnosis not present

## 2022-04-05 DIAGNOSIS — G3183 Dementia with Lewy bodies: Secondary | ICD-10-CM | POA: Diagnosis not present

## 2022-04-05 DIAGNOSIS — R269 Unspecified abnormalities of gait and mobility: Secondary | ICD-10-CM | POA: Diagnosis not present

## 2022-04-05 DIAGNOSIS — Z7409 Other reduced mobility: Secondary | ICD-10-CM | POA: Diagnosis not present

## 2022-04-05 DIAGNOSIS — Z9181 History of falling: Secondary | ICD-10-CM | POA: Diagnosis not present

## 2022-04-08 DIAGNOSIS — G3183 Dementia with Lewy bodies: Secondary | ICD-10-CM | POA: Diagnosis not present

## 2022-04-08 DIAGNOSIS — Z9181 History of falling: Secondary | ICD-10-CM | POA: Diagnosis not present

## 2022-04-08 DIAGNOSIS — R269 Unspecified abnormalities of gait and mobility: Secondary | ICD-10-CM | POA: Diagnosis not present

## 2022-04-08 DIAGNOSIS — Z7409 Other reduced mobility: Secondary | ICD-10-CM | POA: Diagnosis not present

## 2022-04-08 DIAGNOSIS — F02A2 Dementia in other diseases classified elsewhere, mild, with psychotic disturbance: Secondary | ICD-10-CM | POA: Diagnosis not present

## 2022-04-12 DIAGNOSIS — R269 Unspecified abnormalities of gait and mobility: Secondary | ICD-10-CM | POA: Diagnosis not present

## 2022-04-12 DIAGNOSIS — F02A2 Dementia in other diseases classified elsewhere, mild, with psychotic disturbance: Secondary | ICD-10-CM | POA: Diagnosis not present

## 2022-04-12 DIAGNOSIS — Z9181 History of falling: Secondary | ICD-10-CM | POA: Diagnosis not present

## 2022-04-12 DIAGNOSIS — G3183 Dementia with Lewy bodies: Secondary | ICD-10-CM | POA: Diagnosis not present

## 2022-04-12 DIAGNOSIS — Z7409 Other reduced mobility: Secondary | ICD-10-CM | POA: Diagnosis not present

## 2022-04-13 DIAGNOSIS — Z8673 Personal history of transient ischemic attack (TIA), and cerebral infarction without residual deficits: Secondary | ICD-10-CM | POA: Diagnosis not present

## 2022-04-13 DIAGNOSIS — Z8679 Personal history of other diseases of the circulatory system: Secondary | ICD-10-CM | POA: Diagnosis not present

## 2022-04-13 DIAGNOSIS — R419 Unspecified symptoms and signs involving cognitive functions and awareness: Secondary | ICD-10-CM | POA: Diagnosis not present

## 2022-04-13 DIAGNOSIS — R279 Unspecified lack of coordination: Secondary | ICD-10-CM | POA: Diagnosis not present

## 2022-04-13 DIAGNOSIS — R41841 Cognitive communication deficit: Secondary | ICD-10-CM | POA: Diagnosis not present

## 2022-04-13 DIAGNOSIS — R296 Repeated falls: Secondary | ICD-10-CM | POA: Diagnosis not present

## 2022-04-13 DIAGNOSIS — Z789 Other specified health status: Secondary | ICD-10-CM | POA: Diagnosis not present

## 2022-04-13 DIAGNOSIS — R413 Other amnesia: Secondary | ICD-10-CM | POA: Diagnosis not present

## 2022-04-13 DIAGNOSIS — Z6836 Body mass index (BMI) 36.0-36.9, adult: Secondary | ICD-10-CM | POA: Diagnosis not present

## 2022-04-13 DIAGNOSIS — R4189 Other symptoms and signs involving cognitive functions and awareness: Secondary | ICD-10-CM | POA: Diagnosis not present

## 2022-04-13 DIAGNOSIS — G3183 Dementia with Lewy bodies: Secondary | ICD-10-CM | POA: Diagnosis not present

## 2022-04-13 DIAGNOSIS — F02A2 Dementia in other diseases classified elsewhere, mild, with psychotic disturbance: Secondary | ICD-10-CM | POA: Diagnosis not present

## 2022-04-13 DIAGNOSIS — S32010A Wedge compression fracture of first lumbar vertebra, initial encounter for closed fracture: Secondary | ICD-10-CM | POA: Diagnosis not present

## 2022-04-14 DIAGNOSIS — R41841 Cognitive communication deficit: Secondary | ICD-10-CM | POA: Diagnosis not present

## 2022-04-14 DIAGNOSIS — G40209 Localization-related (focal) (partial) symptomatic epilepsy and epileptic syndromes with complex partial seizures, not intractable, without status epilepticus: Secondary | ICD-10-CM | POA: Diagnosis not present

## 2022-04-14 DIAGNOSIS — Z87891 Personal history of nicotine dependence: Secondary | ICD-10-CM | POA: Diagnosis not present

## 2022-04-14 DIAGNOSIS — G40109 Localization-related (focal) (partial) symptomatic epilepsy and epileptic syndromes with simple partial seizures, not intractable, without status epilepticus: Secondary | ICD-10-CM | POA: Diagnosis not present

## 2022-04-14 DIAGNOSIS — Z79899 Other long term (current) drug therapy: Secondary | ICD-10-CM | POA: Diagnosis not present

## 2022-04-14 DIAGNOSIS — I671 Cerebral aneurysm, nonruptured: Secondary | ICD-10-CM | POA: Diagnosis not present

## 2022-04-14 DIAGNOSIS — I10 Essential (primary) hypertension: Secondary | ICD-10-CM | POA: Diagnosis not present

## 2022-04-14 DIAGNOSIS — R413 Other amnesia: Secondary | ICD-10-CM | POA: Diagnosis not present

## 2022-04-19 DIAGNOSIS — R3915 Urgency of urination: Secondary | ICD-10-CM | POA: Diagnosis not present

## 2022-04-19 DIAGNOSIS — Z9181 History of falling: Secondary | ICD-10-CM | POA: Diagnosis not present

## 2022-04-19 DIAGNOSIS — R4189 Other symptoms and signs involving cognitive functions and awareness: Secondary | ICD-10-CM | POA: Diagnosis not present

## 2022-04-19 DIAGNOSIS — R269 Unspecified abnormalities of gait and mobility: Secondary | ICD-10-CM | POA: Diagnosis not present

## 2022-04-19 DIAGNOSIS — R296 Repeated falls: Secondary | ICD-10-CM | POA: Diagnosis not present

## 2022-04-19 DIAGNOSIS — R419 Unspecified symptoms and signs involving cognitive functions and awareness: Secondary | ICD-10-CM | POA: Diagnosis not present

## 2022-04-19 DIAGNOSIS — F02A2 Dementia in other diseases classified elsewhere, mild, with psychotic disturbance: Secondary | ICD-10-CM | POA: Diagnosis not present

## 2022-04-19 DIAGNOSIS — R413 Other amnesia: Secondary | ICD-10-CM | POA: Diagnosis not present

## 2022-04-19 DIAGNOSIS — G3183 Dementia with Lewy bodies: Secondary | ICD-10-CM | POA: Diagnosis not present

## 2022-04-19 DIAGNOSIS — Z7409 Other reduced mobility: Secondary | ICD-10-CM | POA: Diagnosis not present

## 2022-04-19 DIAGNOSIS — Z888 Allergy status to other drugs, medicaments and biological substances status: Secondary | ICD-10-CM | POA: Diagnosis not present

## 2022-04-19 DIAGNOSIS — N401 Enlarged prostate with lower urinary tract symptoms: Secondary | ICD-10-CM | POA: Diagnosis not present

## 2022-04-19 DIAGNOSIS — R279 Unspecified lack of coordination: Secondary | ICD-10-CM | POA: Diagnosis not present

## 2022-04-19 DIAGNOSIS — Z125 Encounter for screening for malignant neoplasm of prostate: Secondary | ICD-10-CM | POA: Diagnosis not present

## 2022-04-19 DIAGNOSIS — R351 Nocturia: Secondary | ICD-10-CM | POA: Diagnosis not present

## 2022-04-26 DIAGNOSIS — R269 Unspecified abnormalities of gait and mobility: Secondary | ICD-10-CM | POA: Diagnosis not present

## 2022-04-26 DIAGNOSIS — Z7409 Other reduced mobility: Secondary | ICD-10-CM | POA: Diagnosis not present

## 2022-04-26 DIAGNOSIS — G3183 Dementia with Lewy bodies: Secondary | ICD-10-CM | POA: Diagnosis not present

## 2022-04-26 DIAGNOSIS — Z9181 History of falling: Secondary | ICD-10-CM | POA: Diagnosis not present

## 2022-04-26 DIAGNOSIS — F02A2 Dementia in other diseases classified elsewhere, mild, with psychotic disturbance: Secondary | ICD-10-CM | POA: Diagnosis not present

## 2022-04-28 DIAGNOSIS — R269 Unspecified abnormalities of gait and mobility: Secondary | ICD-10-CM | POA: Diagnosis not present

## 2022-04-28 DIAGNOSIS — F02A2 Dementia in other diseases classified elsewhere, mild, with psychotic disturbance: Secondary | ICD-10-CM | POA: Diagnosis not present

## 2022-04-28 DIAGNOSIS — Z7409 Other reduced mobility: Secondary | ICD-10-CM | POA: Diagnosis not present

## 2022-04-28 DIAGNOSIS — Z9181 History of falling: Secondary | ICD-10-CM | POA: Diagnosis not present

## 2022-04-28 DIAGNOSIS — G3183 Dementia with Lewy bodies: Secondary | ICD-10-CM | POA: Diagnosis not present

## 2022-05-02 ENCOUNTER — Telehealth: Payer: Self-pay | Admitting: Emergency Medicine

## 2022-05-02 ENCOUNTER — Ambulatory Visit: Payer: Medicare Other | Admitting: Internal Medicine

## 2022-05-02 ENCOUNTER — Ambulatory Visit: Payer: Self-pay

## 2022-05-02 NOTE — Progress Notes (Deleted)
Subjective:    Patient ID: Jeff Weaver, male    DOB: May 07, 1946, 76 y.o.   MRN: [id number]      HPI Jeff Weaver is here for No chief complaint on file.  At 12 am yesterday he had a drop of blood in the toilet and some blood on the toilet seat after he voided.  He had blood in his underwear.  He had blood    I referred him to urology 10/2021  Medications and allergies reviewed with patient and updated if appropriate.  Current Outpatient Medications on File Prior to Visit  Medication Sig Dispense Refill   acetaminophen (TYLENOL) 325 MG tablet Take 325-650 mg by mouth every 6 (six) hours as needed (pain/headaches.).     cetirizine (ZYRTEC) 10 MG tablet Take 1 tablet (10 mg total) by mouth daily. 30 tablet 11   Cholecalciferol (VITAMIN D) 50 MCG (2000 UT) CAPS Take 2,000 Units by mouth in the morning.     clonazePAM (KLONOPIN) 1 MG disintegrating tablet Take 1 mg by mouth as directed. ALLOW 1 TABLET TO DISSOLVE IN CHEEK FOR SEIZURES LASTING LONGER THAN 4 MINUTES. OR AFTER 2ND SEIZURE IN 24 HOURS     donepezil (ARICEPT) 10 MG tablet Take half a tablet in the morning for a month and then increase to 1 full tablet daily. 30 tablet 5   HYDROcodone-acetaminophen (NORCO) 5-325 MG tablet Take 1 tablet by mouth 3 (three) times daily as needed. 20 tablet 0   levETIRAcetam (KEPPRA) 500 MG tablet Take 3.5 tablets by mouth in the morning and at bedtime.     LORazepam (ATIVAN) 1 MG tablet Take 1 mg by mouth every 6 (six) hours as needed for seizure.      losartan (COZAAR) 25 MG tablet Take 1 tablet (25 mg total) by mouth daily. 90 tablet 3   metoprolol succinate (TOPROL-XL) 25 MG 24 hr tablet Take 1 tablet (25 mg total) by mouth daily. 90 tablet 3   Midazolam (NAYZILAM) 5 MG/0.1ML SOLN Place 1 each into the nose as needed (seizure).     mirabegron ER (MYRBETRIQ) 50 MG TB24 tablet Take 1 tablet (50 mg total) by mouth daily. 90 tablet 1   nitroGLYCERIN (NITROSTAT) 0.4 MG SL tablet PLACE 1 TABLET (0.4 MG  TOTAL) UNDER THE TONGUE EVERY 5 (FIVE) MINUTES AS NEEDED FOR CHEST PAIN. (Patient taking differently: Place 0.4 mg under the tongue every 5 (five) minutes x 3 doses as needed for chest pain.) 25 tablet 7   NON FORMULARY CPAP Machine: authorized by [facility] in [place]      nystatin ointment (MYCOSTATIN) Apply 1 application. topically 2 (two) times daily. 30 g 3   Propylene Glycol (SYSTANE BALANCE) 0.6 % SOLN Apply 1 drop to eye daily as needed (dry eyes).     pyridOXINE (VITAMIN B-6) 100 MG tablet Take 100 mg by mouth in the morning.     rosuvastatin (CRESTOR) 5 MG tablet TAKE 1 TABLET BY MOUTH THREE TIMES A WEEK AND INCREASE AS TOLERATED (Patient taking differently: Take 5 mg by mouth every Monday, Wednesday, and Friday. In the evening) 90 tablet 3   Tamsulosin HCl (FLOMAX) 0.4 MG CAPS Take 0.4 mg by mouth every evening.     thiamine 100 MG tablet Take 100 mg by mouth in the morning.     torsemide (DEMADEX) 20 MG tablet Take 0.5 tablets (10 mg total) by mouth daily. 45 tablet 1   traZODone (DESYREL) 50 MG tablet Take 1 tablet by mouth  at bedtime.     triamcinolone cream (KENALOG) 0.5 % Apply 1 application. topically 3 (three) times daily. 60 g 1   VIMPAT 200 MG TABS tablet Take 200 mg by mouth 2 (two) times daily.     vitamin B-12 (CYANOCOBALAMIN) 1000 MCG tablet Take 1,000 mcg by mouth in the morning.     No current facility-administered medications on file prior to visit.    Review of Systems     Objective:  There were no vitals filed for this visit. BP Readings from Last 3 Encounters:  03/21/22 130/74  02/07/22 114/60  01/02/22 (!) 142/85   Wt Readings from Last 3 Encounters:  03/21/22 237 lb (107.5 kg)  02/07/22 232 lb (105.2 kg)  11/15/21 230 lb 12.8 oz (104.7 kg)   There is no height or weight on file to calculate BMI.    Physical Exam         Assessment & Plan:    See Problem List for Assessment and Plan of chronic medical problems.

## 2022-05-03 ENCOUNTER — Ambulatory Visit: Payer: Medicare Other | Admitting: Internal Medicine

## 2022-05-04 DIAGNOSIS — F02A2 Dementia in other diseases classified elsewhere, mild, with psychotic disturbance: Secondary | ICD-10-CM | POA: Diagnosis not present

## 2022-05-04 DIAGNOSIS — R269 Unspecified abnormalities of gait and mobility: Secondary | ICD-10-CM | POA: Diagnosis not present

## 2022-05-04 DIAGNOSIS — G3183 Dementia with Lewy bodies: Secondary | ICD-10-CM | POA: Diagnosis not present

## 2022-05-04 DIAGNOSIS — Z9181 History of falling: Secondary | ICD-10-CM | POA: Diagnosis not present

## 2022-05-04 DIAGNOSIS — Z7409 Other reduced mobility: Secondary | ICD-10-CM | POA: Diagnosis not present

## 2022-05-04 DIAGNOSIS — N5089 Other specified disorders of the male genital organs: Secondary | ICD-10-CM | POA: Diagnosis not present

## 2022-05-04 DIAGNOSIS — N432 Other hydrocele: Secondary | ICD-10-CM | POA: Diagnosis not present

## 2022-05-04 DIAGNOSIS — N433 Hydrocele, unspecified: Secondary | ICD-10-CM | POA: Diagnosis not present

## 2022-05-04 DIAGNOSIS — R58 Hemorrhage, not elsewhere classified: Secondary | ICD-10-CM | POA: Diagnosis not present

## 2022-05-06 ENCOUNTER — Telehealth: Payer: Self-pay

## 2022-05-06 NOTE — Telephone Encounter (Signed)
I spoke with the patient daughter and she is going to come pick up a relay monitor for the patient.

## 2022-05-08 ENCOUNTER — Encounter: Payer: Self-pay | Admitting: Internal Medicine

## 2022-05-08 NOTE — Progress Notes (Unsigned)
Subjective:    Patient ID: Jeff Weaver, male    DOB: 08-19-1945, 76 y.o.   MRN: [id number]      HPI Colan is here for  Chief Complaint  Patient presents with   Hematuria         Medications and allergies reviewed with patient and updated if appropriate.  Current Outpatient Medications on File Prior to Visit  Medication Sig Dispense Refill   acetaminophen (TYLENOL) 325 MG tablet Take 325-650 mg by mouth every 6 (six) hours as needed (pain/headaches.).     cetirizine (ZYRTEC) 10 MG tablet Take 1 tablet (10 mg total) by mouth daily. 30 tablet 11   Cholecalciferol (VITAMIN D) 50 MCG (2000 UT) CAPS Take 2,000 Units by mouth in the morning.     clonazePAM (KLONOPIN) 1 MG disintegrating tablet Take 1 mg by mouth as directed. ALLOW 1 TABLET TO DISSOLVE IN CHEEK FOR SEIZURES LASTING LONGER THAN 4 MINUTES. OR AFTER 2ND SEIZURE IN 24 HOURS     donepezil (ARICEPT) 10 MG tablet Take half a tablet in the morning for a month and then increase to 1 full tablet daily. 30 tablet 5   HYDROcodone-acetaminophen (NORCO) 5-325 MG tablet Take 1 tablet by mouth 3 (three) times daily as needed. 20 tablet 0   levETIRAcetam (KEPPRA) 500 MG tablet Take 3.5 tablets by mouth in the morning and at bedtime.     LORazepam (ATIVAN) 1 MG tablet Take 1 mg by mouth every 6 (six) hours as needed for seizure.      losartan (COZAAR) 25 MG tablet Take 1 tablet (25 mg total) by mouth daily. 90 tablet 3   metoprolol succinate (TOPROL-XL) 25 MG 24 hr tablet Take 1 tablet (25 mg total) by mouth daily. 90 tablet 3   Midazolam (NAYZILAM) 5 MG/0.1ML SOLN Place 1 each into the nose as needed (seizure).     mirabegron ER (MYRBETRIQ) 50 MG TB24 tablet Take 1 tablet (50 mg total) by mouth daily. 90 tablet 1   nitroGLYCERIN (NITROSTAT) 0.4 MG SL tablet PLACE 1 TABLET (0.4 MG TOTAL) UNDER THE TONGUE EVERY 5 (FIVE) MINUTES AS NEEDED FOR CHEST PAIN. (Patient taking differently: Place 0.4 mg under the tongue every 5 (five) minutes x  3 doses as needed for chest pain.) 25 tablet 7   NON FORMULARY CPAP Machine: authorized by [facility] in [place]      nystatin ointment (MYCOSTATIN) Apply 1 application. topically 2 (two) times daily. 30 g 3   Propylene Glycol (SYSTANE BALANCE) 0.6 % SOLN Apply 1 drop to eye daily as needed (dry eyes).     pyridOXINE (VITAMIN B-6) 100 MG tablet Take 100 mg by mouth in the morning.     rosuvastatin (CRESTOR) 5 MG tablet TAKE 1 TABLET BY MOUTH THREE TIMES A WEEK AND INCREASE AS TOLERATED (Patient taking differently: Take 5 mg by mouth every Monday, Wednesday, and Friday. In the evening) 90 tablet 3   Tamsulosin HCl (FLOMAX) 0.4 MG CAPS Take 0.4 mg by mouth every evening.     thiamine 100 MG tablet Take 100 mg by mouth in the morning.     torsemide (DEMADEX) 20 MG tablet Take 0.5 tablets (10 mg total) by mouth daily. 45 tablet 1   traZODone (DESYREL) 50 MG tablet Take 1 tablet by mouth at bedtime.     triamcinolone cream (KENALOG) 0.5 % Apply 1 application. topically 3 (three) times daily. 60 g 1   VIMPAT 200 MG TABS tablet Take 200 mg  by mouth 2 (two) times daily.     vitamin B-12 (CYANOCOBALAMIN) 1000 MCG tablet Take 1,000 mcg by mouth in the morning.     No current facility-administered medications on file prior to visit.    Review of Systems     Objective:  There were no vitals filed for this visit. BP Readings from Last 3 Encounters:  03/21/22 130/74  02/07/22 114/60  01/02/22 (!) 142/85   Wt Readings from Last 3 Encounters:  03/21/22 237 lb (107.5 kg)  02/07/22 232 lb (105.2 kg)  11/15/21 230 lb 12.8 oz (104.7 kg)   There is no height or weight on file to calculate BMI.    Physical Exam         Assessment & Plan:    See Problem List for Assessment and Plan of chronic medical problems.

## 2022-05-08 NOTE — Patient Instructions (Addendum)
      Blood work was ordered.   The lab is on the first floor.    Medications changes include :       A referral was ordered for XXX.     Someone will call you to schedule an appointment.    Return if symptoms worsen or fail to improve.

## 2022-05-09 ENCOUNTER — Ambulatory Visit (INDEPENDENT_AMBULATORY_CARE_PROVIDER_SITE_OTHER): Payer: Medicare Other | Admitting: Internal Medicine

## 2022-05-09 VITALS — BP 126/78 | HR 60 | Temp 98.7°F | Ht 68.5 in | Wt 237.2 lb

## 2022-05-09 DIAGNOSIS — B3789 Other sites of candidiasis: Secondary | ICD-10-CM

## 2022-05-09 DIAGNOSIS — I2510 Atherosclerotic heart disease of native coronary artery without angina pectoris: Secondary | ICD-10-CM

## 2022-05-09 DIAGNOSIS — I10 Essential (primary) hypertension: Secondary | ICD-10-CM | POA: Diagnosis not present

## 2022-05-09 DIAGNOSIS — R195 Other fecal abnormalities: Secondary | ICD-10-CM | POA: Diagnosis not present

## 2022-05-09 MED ORDER — LOSARTAN POTASSIUM 25 MG PO TABS: 12.5000 mg | ORAL_TABLET | Freq: Every day | ORAL | 3 refills | Status: DC

## 2022-05-09 MED ORDER — LOPERAMIDE HCL 2 MG PO TABS: 2.0000 mg | ORAL_TABLET | Freq: Three times a day (TID) | ORAL | 5 refills | Status: DC | PRN

## 2022-05-09 MED ORDER — NYSTATIN 100000 UNIT/GM EX OINT: 1.0000 | TOPICAL_OINTMENT | Freq: Two times a day (BID) | CUTANEOUS | 3 refills | Status: DC

## 2022-05-09 NOTE — Progress Notes (Unsigned)
Cardiology Office Note    Date:  05/10/2022   ID:  Jeff Weaver, DOB January 12, 1946, MRN [id number]  PCP:  [name], MD  Cardiologist:  Fransico Him, MD  Electrophysiologist:  Constance Haw, MD   Chief Complaint: f/u CHF, CAD  History of Present Illness:   Jeff Weaver is a 76 y.o. 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   He has a long history of chronic atypical chest pain with intermittent evaluations over the years. Last cath was in 01/12/18 showing minimal nonobstructive CAD, normal LVEF, normal LVEDP. He had 2nd degree AVB type 1 during cath prompting monitor 02/2018 which showed profound bradycardia, AVB with frequently nonconducted P waves, frequent PVCs (7%) and short runs of NSVT so underwent PPM implantation. Repeat echo 11/2020 demonstrated a decline in EF to 35%. Nuclear stress test 12/2020 was negative for ischemia. EP felt RV pacing was contributing to systolic dysfunction so underwent upgrade to CRT-P in 03/2021. Though he's done reasonably well from a cardiac standpoint since that time, he has had several complex medical admissions since then. He's had progressive cognitive decline ultimately felt to have Lewy body dementia and ground level fall (07/2021) 2/2 pre-syncope with right hip fracture, rib fracture, subdural hematoma, possible aspiration penumonitis, delirium, and complicated UTI. He underwent hip replacement. His SDH appeared to have been managed conservatively. I also take care of  his wife Jeff Weaver who previously relayed she was concerned during the hospitalization she felt as though the focus was on his dementia and less so on care of his other medical problems. Of note, she has previously requested not to specifically discuss dementia diagnosis with the patient as he would get upset and worked up about this. Repeat echo 11/30/21 showed normalization of LVEF to 55-60%, mild LVH, G1DD, normal RV, mildly elevated PASP, mild AI, mild dilation of aortic root, dilated IVC. Lasix was changed to low dose torsemide last OV. He has not been on more aggressive doses of diuretic due to frequent urination, utilizing adult diaper. Amlodipine was previously discontinued due to labile hypotension at times.  He is seen for follow-up largely stable from last visit. However, he is experiencing what sounds like some intermittent orthostasis though can be dizzy without provocation at other times as well. He's described this in the past as well but seems to be more noticeable over the preceding months with PT. He continues to have rare non-anginal type focal chest pains. No syncope. His edema has resolved. He saw PCP yesterday who suggested to decrease losartan to 1/2 tablet daily. His mobility is getting more challenging, including generalized decreased strength transferring out of a chair, and his wife is looking into getting him in to see a movement specialist.  Labwork independently reviewed: 03/2022 CBC wnl, K 3.8, Cr 0.93, LFTs ok, A1C 5.9, LDL 65, trig wnl 11/2021 TSH wnl   Cardiology Studies:   Studies reviewed are outlined and summarized above. Reports included below if pertinent.   2d echo 11/2021  1. Left ventricular ejection fraction, by estimation, is 55 to 60%. The  left ventricle has normal function. The left ventricle has no regional  wall motion abnormalities. There is mild concentric left ventricular  hypertrophy. Left ventricular diastolic  parameters are consistent with  Grade I diastolic dysfunction (impaired  relaxation). The average left ventricular global longitudinal strain is  -21.1 %. The global longitudinal strain is normal.   2. Right ventricular systolic function is normal. The right ventricular  size is normal. There is mildly elevated pulmonary artery systolic  pressure.   3. The mitral valve is normal in structure. Trivial mitral valve  regurgitation. No evidence of mitral stenosis.   4. The aortic valve is tricuspid. Aortic valve regurgitation is mild. No  aortic stenosis is present.   5. Aortic dilatation noted. There is mild dilatation of the aortic root,  measuring 39 mm. There is mild dilatation of the ascending aorta,  measuring 37 mm.   6. The inferior vena cava is dilated in size with >50% respiratory  variability, suggesting right atrial pressure of 8 mmHg    Past Medical History:  Diagnosis Date   Aortic insufficiency    a. mild 2017 echo.   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 s/p clipping at [facility] in 8/13 c/b short-term memory loss, cerebral hemorrhage and seizure disorder s/p VP shunt.   Cerebral hemorrhage (HCC)    Chronic combined systolic and diastolic CHF (congestive heart failure) (HCC)    CVA (cerebral infarction)    Diverticulosis    Enteritis (regional)    Dr Olevia Perches   First degree AV block    GI bleed    HTN (hypertension)    Hyperlipidemia    Hypertensive heart disease    IBS (irritable bowel syndrome)    Lewy body dementia ([place])    Mild CAD    Cardiac cath 01/12/18 showed minimal nonobstructive CAD, normal LVEF, normal LVEDP.   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   Rheumatoid arthritis(714.0)    dxed in [employer] in 1980s   Second degree AV block, Mobitz type I     Seizures ([place])    last April 26,2021   Sinus bradycardia    Sleep apnea    SSS (sick sinus syndrome) (HCC)    Subdural hematoma (HCC)     Past Surgical History:  Procedure Laterality Date   BIV PACEMAKER INSERTION CRT-P N/A 03/10/2021   Procedure: BIV PACEMAKER INSERTION CRT-P UPGRADE;  Surgeon: Constance Haw, MD;  Location: [place] [facility];  Service: Cardiovascular;  Laterality: N/A;   CHOLECYSTECTOMY N/A 01/07/2016   Procedure: LAPAROSCOPIC CHOLECYSTECTOMY ;  Surgeon: Coralie Keens, MD;  Location: [place];  Service: General;  Laterality: N/A;   cns shunt  02/23/12   COLONOSCOPY W/ POLYPECTOMY  1978   negative 2009, Dr Olevia Perches. Due 2014   CRANIOTOMY  02/02/12   Dr Harvel Ricks, [place] of aneurysm   CRANIOTOMY  02-02-12   left pterional craniotomy for clipping complex anterior communicating artery aneurysm    HERNIA REPAIR     LEFT HEART CATH AND CORONARY ANGIOGRAPHY N/A 01/12/2018   Procedure: LEFT HEART CATH AND CORONARY ANGIOGRAPHY;  Surgeon: Martinique, Peter M, MD;  Location: [place] [facility];  Service:  Cardiovascular;  Laterality: N/A;   LEFT HEART CATHETERIZATION WITH CORONARY ANGIOGRAM N/A 11/26/2013   Procedure: LEFT HEART CATHETERIZATION WITH CORONARY ANGIOGRAM;  Surgeon: [name], MD;  Location: [facility];  Service: Cardiovascular;  Laterality: N/A;   NOSE SURGERY     PACEMAKER IMPLANT N/A 07/02/2018   Procedure: PACEMAKER IMPLANT;  Surgeon: Constance Haw, MD;  Location: [place] [facility];  Service: Cardiovascular;  Laterality: N/A;   SHOULDER SURGERY  1997   TONSILLECTOMY AND ADENOIDECTOMY     VENTRICULOPERITONEAL SHUNT  02-23-12   INSERTION OF RIGHT FRONTAL VENTRICULOPERITONEAL SHUNT WITH A CODMAN HAKIM PROGRAMMABLE VALVE    Current Medications: Current Meds  Medication Sig   acetaminophen (TYLENOL) 325 MG tablet Take 325-650 mg by mouth every 6 (six) hours as needed (pain/headaches.).   cetirizine (ZYRTEC) 10 MG tablet Take 1 tablet (10 mg total)  by mouth daily.   Cholecalciferol (VITAMIN D) 50 MCG (2000 UT) CAPS Take 2,000 Units by mouth in the morning.   clonazePAM (KLONOPIN) 1 MG disintegrating tablet Take 1 mg by mouth as directed. ALLOW 1 TABLET TO DISSOLVE IN CHEEK FOR SEIZURES LASTING LONGER THAN 4 MINUTES. OR AFTER 2ND SEIZURE IN 24 HOURS   donepezil (ARICEPT) 10 MG tablet Take half a tablet in the morning for a month and then increase to 1 full tablet daily.   furosemide (LASIX) 20 MG tablet Take 1 tablet by mouth daily.   HYDROcodone-acetaminophen (NORCO) 5-325 MG tablet Take 1 tablet by mouth 3 (three) times daily as needed.   levETIRAcetam (KEPPRA) 500 MG tablet Take 3.5 tablets by mouth in the morning and at bedtime.   loperamide (IMODIUM) 2 MG capsule Take 2 mg by mouth as needed.   LORazepam (ATIVAN) 1 MG tablet Take 1 mg by mouth every 6 (six) hours as needed for seizure.    losartan (COZAAR) 25 MG tablet Take 0.5 tablets (12.5 mg total) by mouth daily.   metoprolol succinate (TOPROL-XL) 25 MG 24 hr tablet Take 1 tablet (25 mg total) by mouth daily.   Midazolam (NAYZILAM) 5 MG/0.1ML SOLN Place 1 each into the nose as needed (seizure).   mirabegron ER (MYRBETRIQ) 50 MG TB24 tablet Take 1 tablet (50 mg total) by mouth daily.   NON FORMULARY CPAP Machine: authorized by [facility] in [place]    nystatin ointment (MYCOSTATIN) Apply 1 Application topically 2 (two) times daily.   Propylene Glycol (SYSTANE BALANCE) 0.6 % SOLN Apply 1 drop to eye daily as needed (dry eyes).   pyridOXINE (VITAMIN B-6) 100 MG tablet Take 100 mg by mouth in the morning.   Tamsulosin HCl (FLOMAX) 0.4 MG CAPS Take 0.4 mg by mouth every evening.   thiamine 100 MG tablet Take 100 mg by mouth in the morning.   torsemide (DEMADEX) 20 MG tablet Take 0.5 tablets (10 mg total) by mouth daily.   traZODone (DESYREL) 50 MG tablet Take 1 tablet by mouth at bedtime.   triamcinolone cream (KENALOG) 0.5 % Apply 1 application. topically 3 (three) times daily.   VIMPAT 200 MG  TABS tablet Take 200 mg by mouth 2 (two) times daily.   vitamin B-12 (CYANOCOBALAMIN) 1000 MCG tablet Take 1,000 mcg by mouth in the morning.      Allergies:   Haloperidol, Rosuvastatin, Simvastatin, Lisinopril, Nifedipine, and Pravastatin   Social History   Socioeconomic History   Marital status: Married    Spouse name: Not on file   Number of children: 1   Years of education:  Not on file   Highest education level: Not on file  Occupational History   Occupation: retired/ [employer]  Tobacco Use   Smoking status: Former    Packs/day: 0.00    Years: 30.00    Total pack years: 0.00    Types: Pipe, Cigarettes    Quit date: 07/04/2008    Years since quitting: 13.8   Smokeless tobacco: Former  Scientific laboratory technician Use: Never used  Substance and Sexual Activity   Alcohol use: Yes    Alcohol/week: 0.0 standard drinks of alcohol    Comment: Very Infrequently    Drug use: No   Sexual activity: Not Currently  Other Topics Concern   Not on file  Social History Narrative   Not on file   Social Determinants of Health   Financial Resource Strain: Low Risk  (05/17/2018)   Overall Financial Resource Strain (CARDIA)    Difficulty of Paying Living Expenses: Not hard at all  Food Insecurity: No Food Insecurity (05/17/2018)   Hunger Vital Sign    Worried About Running Out of Food in the Last Year: Never true    Ran Out of Food in the Last Year: Never true  Transportation Needs: No Transportation Needs (05/17/2018)   PRAPARE - Hydrologist (Medical): No    Lack of Transportation (Non-Medical): No  Physical Activity: Insufficiently Active (05/28/2019)   Exercise Vital Sign    Days of Exercise per Week: 3 days    Minutes of Exercise per Session: 30 min  Stress: No Stress Concern Present (05/28/2019)   [place]    Feeling of Stress : Only a little  Social Connections: Unknown (05/28/2019)    Social Connection and Isolation Panel [NHANES]    Frequency of Communication with Friends and Family: Not on file    Frequency of Social Gatherings with Friends and Family: Not on file    Attends Religious Services: Not on Advertising copywriter or Organizations: Not on file    Attends Archivist Meetings: Not on file    Marital Status: Married     Family History:  The patient's family history includes COPD in his father; Coronary artery disease in his father; Dementia (age of onset: 25) in his sister; Diabetes in his brother and sister; Hepatitis in his mother. There is no history of Colon cancer, Esophageal cancer, Rectal cancer, Stomach cancer, or Colon polyps.  ROS:   Please see the history of present illness.  All other systems are reviewed and otherwise negative.    EKG(s)/Additional Labs   EKG:  EKG is not ordered today  Recent Labs: 11/30/2021: TSH 2.240 03/21/2022: ALT 29; BUN 15; Creatinine, Ser 0.93; Hemoglobin 14.0; Platelets 172.0; Potassium 3.8; Sodium 138  Recent Lipid Panel    Component Value Date/Time   CHOL 135 03/21/2022 1249   CHOL 139 11/30/2021 0853   CHOL 159 03/27/2013 0818   TRIG 103.0 03/21/2022 1249   TRIG 127 03/27/2013 0818   HDL 49.50 03/21/2022 1249   HDL 52 11/30/2021 0853   HDL 45 03/27/2013 0818   CHOLHDL 3 03/21/2022 1249   VLDL 20.6 03/21/2022 1249   LDLCALC 65 03/21/2022 1249   LDLCALC 71 11/30/2021 0853   LDLCALC 89 03/27/2013 0818   LDLDIRECT 87.6 09/19/2012 1206    PHYSICAL EXAM:    VS:  BP 124/82   Pulse 60   Ht 5' 8.5" (1.74  m)   Wt 238 lb (108 kg)   SpO2 95%   BMI 35.66 kg/m   BMI: Body mass index is 35.66 kg/m.  GEN: Well nourished, well developed male in no acute distress HEENT: normocephalic, atraumatic Neck: no JVD, carotid bruits, or masses Cardiac: RRR; no murmurs, rubs, or gallops, no edema  Respiratory:  clear to auscultation bilaterally, normal work of breathing GI: soft, nontender,  nondistended, + BS MS: no deformity or atrophy Skin: warm and dry, no rash Neuro:  Alert and Oriented x 3, Strength and sensation are intact, follows commands Psych: euthymic mood, full affect  Wt Readings from Last 3 Encounters:  05/10/22 238 lb (108 kg)  05/09/22 237 lb 3.2 oz (107.6 kg)  03/21/22 237 lb (107.5 kg)     ASSESSMENT & PLAN:   1. Chronic combined CHF/presumed NICM - volume status looks excellent today, no further edema on low dose torsemide. Exam unrevealing, NSR with normal BP today. However, he does report some continued orthostatic type dizziness. He has had this complaint intermittently in the past. Wonder to what degree his Parkinsonism is contributing. We had to stop amlodipine in the past. We'll stop losartan altogether to see how he does. If he continues to have issues, would decrease metoprolol next. However, given hx of LV dysfunction and PVCs, will trial continuation of low dose for the meantime since the losartan is more likely to have impact on his BP than metoprolol. If symptoms persist would bring back in for repeat labs though these were stable by recent check 03/2022. His wife will keep me updated how he is doing off the losartan. Of note, we previously had to stop amlodipine in 11/2021 for similar concerns. He is also due for EP f/u.  2. Minimal CAD - long history of atypical chest pain without major change from prior pattern. Not on ASA due to h/o SDH. Continue BB, rosuvastatin as tolerated. LDL at goal by recent check.  3. Essential HTN - follow BP with discontinuation of losartan as above. Given his age and comorbidities, it would be reasonable to tolerate an SBP of 140-145.  4. H/o PVCs - quiescent, continue with metoprolol as we are able. Exam remains consistent with NSR. After his visit was over, I did notice that Dr. Curt Bears last recommended EP follow-up in 6 months at 11/2021 OV. I will send a MyChart msg to Mr. Centrella and his wife to remind them to  schedule.  5. Mild dilation of aortic root - consider repeat echocardiogram 11/2022 if appropriate. We can schedule after next visit if appropriate.     Disposition: F/u with me in 3-4 months per family request.   Medication Adjustments/Labs and Tests Ordered: Current medicines are reviewed at length with the patient today.  Concerns regarding medicines are outlined above. Medication changes, Labs and Tests ordered today are summarized above and listed in the Patient Instructions accessible in Encounters.   Signed, Charlie Pitter, PA-C  05/10/2022 1:45 PM    [place] Phone: [phone]; Fax: [phone]

## 2022-05-09 NOTE — Assessment & Plan Note (Signed)
Chronic-Intermittent Continue Mycostatin ointment as needed

## 2022-05-09 NOTE — Assessment & Plan Note (Signed)
Chronic Having frequent loose stools-almost on a daily basis Sometimes this is resulting in accidents making it difficult to manage Has taken Imodium in the past, which has worked well and would like a prescription for this to see if that helps manage this-can take low-dose on a daily or every other day basis Loperamide 2 mg daily as needed

## 2022-05-09 NOTE — Assessment & Plan Note (Signed)
Chronic Has had some episodes of low blood pressure and what sounds like orthostatic lightheadedness Will decrease losartan to 12.5 mg daily Continue metoprolol XL 25 mg daily for now given his improvement in EF after pacemaker addition of metoprolol-can discuss with cardiology after next echo They will start monitoring his blood pressure and see if his lightheadedness gets better with a slight decrease in the BP medication

## 2022-05-10 ENCOUNTER — Ambulatory Visit: Payer: Medicare Other | Attending: Physician Assistant | Admitting: Physician Assistant

## 2022-05-10 ENCOUNTER — Encounter: Payer: Self-pay | Admitting: Physician Assistant

## 2022-05-10 VITALS — BP 124/82 | HR 60 | Ht 68.5 in | Wt 238.0 lb

## 2022-05-10 DIAGNOSIS — I5042 Chronic combined systolic (congestive) and diastolic (congestive) heart failure: Secondary | ICD-10-CM | POA: Diagnosis not present

## 2022-05-10 DIAGNOSIS — I493 Ventricular premature depolarization: Secondary | ICD-10-CM | POA: Diagnosis not present

## 2022-05-10 DIAGNOSIS — M24419 Recurrent dislocation, unspecified shoulder: Secondary | ICD-10-CM | POA: Insufficient documentation

## 2022-05-10 DIAGNOSIS — H832X2 Labyrinthine dysfunction, left ear: Secondary | ICD-10-CM | POA: Insufficient documentation

## 2022-05-10 DIAGNOSIS — H905 Unspecified sensorineural hearing loss: Secondary | ICD-10-CM | POA: Insufficient documentation

## 2022-05-10 DIAGNOSIS — M19019 Primary osteoarthritis, unspecified shoulder: Secondary | ICD-10-CM | POA: Insufficient documentation

## 2022-05-10 DIAGNOSIS — I2510 Atherosclerotic heart disease of native coronary artery without angina pectoris: Secondary | ICD-10-CM | POA: Insufficient documentation

## 2022-05-10 DIAGNOSIS — I10 Essential (primary) hypertension: Secondary | ICD-10-CM | POA: Insufficient documentation

## 2022-05-10 DIAGNOSIS — K5090 Crohn's disease, unspecified, without complications: Secondary | ICD-10-CM | POA: Insufficient documentation

## 2022-05-10 DIAGNOSIS — I77810 Thoracic aortic ectasia: Secondary | ICD-10-CM | POA: Insufficient documentation

## 2022-05-10 DIAGNOSIS — E7800 Pure hypercholesterolemia, unspecified: Secondary | ICD-10-CM | POA: Insufficient documentation

## 2022-05-10 NOTE — Patient Instructions (Signed)
Medication Instructions:     STOP TAKING AND REMOVE THIS MEDICATION FROM YOUR MEDICATION LIST:  LOSARTAN 25 MG    *If you need a refill on your cardiac medications before your next appointment, please call your pharmacy*   Lab Work: [place]    If you have labs (blood work) drawn today and your tests are completely normal, you will receive your results only by: [place] (if you have MyChart) OR A paper copy in the mail If you have any lab test that is abnormal or we need to change your treatment, we will call you to review the results.   Testing/Procedures: NONE ORDERED  TODAY    Follow-Up: At [facility], you and your health needs are our priority.  As part of our continuing mission to provide you with exceptional heart care, we have created designated Provider Care Teams.  These Care Teams include your primary Cardiologist (physician) and Advanced Practice Providers (APPs -  Physician Assistants and Nurse Practitioners) who all work together to provide you with the care you need, when you need it.  We recommend signing up for the patient portal called "MyChart".  Sign up information is provided on this After Visit Summary.  MyChart is used to connect with patients for Virtual Visits (Telemedicine).  Patients are able to view lab/test results, encounter notes, upcoming appointments, etc.  Non-urgent messages can be sent to your provider as well.   To learn more about what you can do with MyChart, go to [email or web address].    Your next appointment:   3 -4 month(s)  The format for your next appointment:   In Person  Provider:  [name], PA-C       Other Instructions   Important Information About Sugar

## 2022-05-13 DIAGNOSIS — R58 Hemorrhage, not elsewhere classified: Secondary | ICD-10-CM | POA: Diagnosis not present

## 2022-05-18 DIAGNOSIS — R269 Unspecified abnormalities of gait and mobility: Secondary | ICD-10-CM | POA: Diagnosis not present

## 2022-05-18 DIAGNOSIS — Z9181 History of falling: Secondary | ICD-10-CM | POA: Diagnosis not present

## 2022-05-18 DIAGNOSIS — G3183 Dementia with Lewy bodies: Secondary | ICD-10-CM | POA: Diagnosis not present

## 2022-05-18 DIAGNOSIS — F02A2 Dementia in other diseases classified elsewhere, mild, with psychotic disturbance: Secondary | ICD-10-CM | POA: Diagnosis not present

## 2022-05-18 DIAGNOSIS — Z7409 Other reduced mobility: Secondary | ICD-10-CM | POA: Diagnosis not present

## 2022-05-20 DIAGNOSIS — R4189 Other symptoms and signs involving cognitive functions and awareness: Secondary | ICD-10-CM | POA: Diagnosis not present

## 2022-05-20 DIAGNOSIS — F02A2 Dementia in other diseases classified elsewhere, mild, with psychotic disturbance: Secondary | ICD-10-CM | POA: Diagnosis not present

## 2022-05-20 DIAGNOSIS — I63512 Cerebral infarction due to unspecified occlusion or stenosis of left middle cerebral artery: Secondary | ICD-10-CM | POA: Diagnosis not present

## 2022-05-20 DIAGNOSIS — R279 Unspecified lack of coordination: Secondary | ICD-10-CM | POA: Diagnosis not present

## 2022-05-20 DIAGNOSIS — G3183 Dementia with Lewy bodies: Secondary | ICD-10-CM | POA: Diagnosis not present

## 2022-05-20 DIAGNOSIS — R296 Repeated falls: Secondary | ICD-10-CM | POA: Diagnosis not present

## 2022-05-20 DIAGNOSIS — Z789 Other specified health status: Secondary | ICD-10-CM | POA: Diagnosis not present

## 2022-05-20 DIAGNOSIS — Z8673 Personal history of transient ischemic attack (TIA), and cerebral infarction without residual deficits: Secondary | ICD-10-CM | POA: Diagnosis not present

## 2022-05-20 DIAGNOSIS — R419 Unspecified symptoms and signs involving cognitive functions and awareness: Secondary | ICD-10-CM | POA: Diagnosis not present

## 2022-05-20 DIAGNOSIS — Z8679 Personal history of other diseases of the circulatory system: Secondary | ICD-10-CM | POA: Diagnosis not present

## 2022-05-20 DIAGNOSIS — R41841 Cognitive communication deficit: Secondary | ICD-10-CM | POA: Diagnosis not present

## 2022-05-20 DIAGNOSIS — R413 Other amnesia: Secondary | ICD-10-CM | POA: Diagnosis not present

## 2022-05-22 NOTE — Therapy (Incomplete)
OUTPATIENT PHYSICAL THERAPY THORACOLUMBAR EVALUATION   Patient Name: Jeff Weaver MRN: [id number] DOB:27-Aug-1945, 76 y.o., male Today's Date: 05/22/2022  END OF SESSION:   Past Medical History:  Diagnosis Date   Aortic insufficiency    a. mild 2017 echo.   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 s/p clipping at [facility] in 8/13 c/b short-term memory loss, cerebral hemorrhage and seizure disorder s/p VP shunt.   Cerebral hemorrhage (HCC)    Chronic combined systolic and diastolic CHF (congestive heart failure) (HCC)    CVA (cerebral infarction)    Diverticulosis    Enteritis (regional)    Dr Olevia Perches   First degree AV block    GI bleed    HTN (hypertension)    Hyperlipidemia    Hypertensive heart disease    IBS (irritable bowel syndrome)    Lewy body dementia ([place])    Mild CAD    Cardiac cath 01/12/18 showed minimal nonobstructive CAD, normal LVEF, normal LVEDP.   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   Rheumatoid arthritis(714.0)    dxed in [employer] in 1980s   Second degree AV block, Mobitz type I    Seizures ([place])    last April 26,2021   Sinus bradycardia    Sleep apnea    SSS (sick sinus syndrome) (HCC)    Subdural hematoma (HCC)    Past Surgical History:  Procedure Laterality Date   BIV PACEMAKER INSERTION CRT-P N/A 03/10/2021   Procedure: BIV PACEMAKER INSERTION CRT-P UPGRADE;  Surgeon: Constance Haw, MD;  Location: [place] [facility];  Service: Cardiovascular;  Laterality: N/A;   CHOLECYSTECTOMY N/A 01/07/2016   Procedure: LAPAROSCOPIC CHOLECYSTECTOMY ;  Surgeon: Coralie Keens, MD;  Location: [place];  Service: General;  Laterality: N/A;   cns shunt  02/23/12   COLONOSCOPY W/ POLYPECTOMY  1978   negative 2009, Dr Olevia Perches.  Due 2014   CRANIOTOMY  02/02/12   Dr Harvel Ricks, [place] of aneurysm   CRANIOTOMY  02-02-12   left pterional craniotomy for clipping complex anterior communicating artery aneurysm    HERNIA REPAIR     LEFT HEART CATH AND CORONARY ANGIOGRAPHY N/A 01/12/2018   Procedure: LEFT HEART CATH AND CORONARY ANGIOGRAPHY;  Surgeon: Martinique, Peter M, MD;  Location: [place] [facility];  Service: Cardiovascular;  Laterality: N/A;   LEFT HEART CATHETERIZATION WITH CORONARY ANGIOGRAM N/A 11/26/2013   Procedure: LEFT HEART CATHETERIZATION WITH CORONARY ANGIOGRAM;  Surgeon: [name], MD;  Location: [facility];  Service: Cardiovascular;  Laterality: N/A;   NOSE SURGERY     PACEMAKER IMPLANT N/A 07/02/2018   Procedure: PACEMAKER IMPLANT;  Surgeon: Constance Haw, MD;  Location: [place] [facility];  Service: Cardiovascular;  Laterality: N/A;   SHOULDER SURGERY  1997   TONSILLECTOMY AND ADENOIDECTOMY     VENTRICULOPERITONEAL SHUNT  02-23-12   INSERTION OF RIGHT FRONTAL VENTRICULOPERITONEAL SHUNT WITH A CODMAN HAKIM PROGRAMMABLE VALVE   Patient Active Problem List   Diagnosis Date Noted   Primary localized osteoarthrosis of shoulder region 05/10/2022   Pure hypercholesterolemia 05/10/2022   Recurrent dislocation of shoulder joint 05/10/2022   Regional enteritis ([place]) 05/10/2022   Vestibular hypofunction of left ear 05/10/2022   Sensorineural hearing loss 05/10/2022  Loose stools 05/09/2022   Senile osteoporosis 02/03/2022   Pain in right hip 01/05/2022   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   Candida rash of groin 10/01/2021   Intertrigo 10/01/2021   HFrEF (heart failure with reduced ejection fraction) ([place]) 09/18/2021   Blepharitis of upper and lower eyelids of both eyes 09/18/2021   Tinea cruris 09/18/2021   Lewy body dementia ([place]) 09/16/2021   Closed fracture of right hip ([place]) 09/16/2021   S/P hip replacement, right  07/2021 09/16/2021   Presence of right artificial hip joint 08/06/2021    Vascular dementia ([place]) 04/29/2021   Cough 04/09/2021   Vitamin D deficiency 04/09/2021   Vitamin B1 deficiency 04/09/2021   Right arm pain 01/06/2021   Abrasion of leg with infection, left, initial encounter 10/28/2020   Seizures ([place])    Muscle cramping 12/30/2019   Dizziness 12/30/2019   Unsteady gait 12/30/2019   Pacemaker 11/27/2019   Fatigue 07/09/2019   SSS (sick sinus syndrome) ([place]) 07/02/2018   Bilateral shoulder pain 06/14/2018   Upper airway cough syndrome vs cough variant asthma 03/20/2018   Acute midline low back pain without sciatica 12/25/2017   Depression 10/17/2017   CAD (coronary artery disease), hx of NSTEMI 08/15/2017   Chest pain 07/17/2017   SOBOE (shortness of breath on exertion) 06/08/2017   Bilateral sensorineural hearing loss 06/01/2017   Nasal turbinate hypertrophy 06/01/2017   Dysphagia 05/31/2017   Osteoporosis 01/30/2017   History of cerebral hemorrhage 12/28/2016   Seborrheic dermatitis 07/25/2016   Cognitive communication deficit 06/28/2016   Hypertensive heart disease 06/28/2016   Vitamin B 12 deficiency 03/16/2016   Memory difficulties 03/13/2016   Confusion 03/13/2016   VP (ventriculoperitoneal) shunt status 02/12/2016   Orthostatic hypotension 01/05/2016   Benign prostatic hyperplasia with urinary obstruction 08/04/2014   Urinary urgency 08/04/2014   Gastroesophageal reflux disease 07/14/2014   Polymyalgia rheumatica ([place]) 05/28/2014   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/2014   SBE (subacute bacterial endocarditis) prophylaxis candidate 11/24/2013   Aphasia 07/17/2013   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 seizures, not intractable, without status epilepticus ([place]) 07/17/2013   Subdural hemorrhage ([place]) 07/17/2013   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[place]) 07/17/2013   Pre-diabetes 03/26/2013   Increased frequency of urination 10/31/2012   Nocturia 10/31/2012   History of cerebral aneurysm repair 10/18/2012    History of stroke without residual deficits 10/18/2012   Blepharitis of both eyes 09/27/2012   Ocular rosacea 09/27/2012   Seizure disorder ([place]) 07/23/2012   Entropion 07/10/2012   Hyperopia with astigmatism 07/10/2012   Combined senile cataract 04/13/2012   Presbyopia 04/13/2012   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 artery ([place]) 03/01/2012   Cognitive safety issue 02/20/2012   Nonruptured cerebral aneurysm 11/23/2011   Obstructive sleep apnea syndrome 08/09/2011   Hyperlipidemia 02/03/2009   HTN (hypertension) 02/03/2009   Osteoarthritis 02/03/2009   History of Crohn's disease 04/17/2008   Allergic rhinitis, unspecified 07/05/1999   Cataract, right eye 07/05/1999    PCP: [name], MD   REFERRING PROVIDER: Consuella Lose, MD   REFERRING DIAG: ***  Rationale for Evaluation and Treatment: Rehabilitation  THERAPY DIAG:  No diagnosis found.  ONSET DATE: ***  SUBJECTIVE:  SUBJECTIVE STATEMENT: ***  PERTINENT HISTORY:  PACEMAKER, CAD,CVA, CHF,HTN, Lewy body dementia, h/o seizures, osteoporosis  PAIN:  Are you having pain? {OPRCPAIN:27236}  PRECAUTIONS: ICD/Pacemaker  WEIGHT BEARING RESTRICTIONS: No  FALLS:  Has patient fallen in last 6 months? {fallsyesno:27318}  LIVING ENVIRONMENT: Lives with: {OPRC lives with:25569::"lives with their family"} Lives in: {Lives in:25570} Stairs: {opstairs:27293} Has following equipment at home: {Assistive devices:23999}  OCCUPATION: ***  PLOF: {PLOF:24004}  PATIENT GOALS: ***  NEXT MD VISIT:   OBJECTIVE:   DIAGNOSTIC FINDINGS:  MRI - 03/10/22 IMPRESSION: 1. Subacute incomplete burst fracture of L1 with approximately 25% height loss and 6 mm retropulsion. Mild spinal canal stenosis at T12-L1. 2. Edema within the  right L1 posterior elements. CT of the lumbar spine would be helpful to assess for fracture extension to this area. 3. Mild lumbar degenerative disc disease without spinal canal or neural foraminal stenosis.  PATIENT SURVEYS:  Modified Oswestry ***  FOTO ***  SCREENING FOR RED FLAGS: Bowel or bladder incontinence: {Yes/No:304960894} Spinal tumors: {Yes/No:304960894} Cauda equina syndrome: {Yes/No:304960894} Compression fracture: {Yes/No:304960894} Abdominal aneurysm: {Yes/No:304960894}  COGNITION: Overall cognitive status: {cognition:24006}     SENSATION: {sensation:27233}  MUSCLE LENGTH:   HS: Quads: ITB: Piriformis: Hip Flexors: Heelcords:   POSTURE: {posture:25561}  PALPATION: Palpation: TTP at ***. Increased tissue tension in *** Spinal Mobility:    LUMBAR ROM:   AROM eval  Flexion   Extension   Right lateral flexion   Left lateral flexion   Right rotation   Left rotation    (Blank rows = not tested)  LOWER EXTREMITY ROM:     Active  Right eval Left eval  Hip flexion    Hip extension    Hip abduction    Hip adduction    Hip internal rotation    Hip external rotation    Knee flexion    Knee extension    Ankle dorsiflexion    Ankle plantarflexion    Ankle inversion    Ankle eversion     (Blank rows = not tested)  LOWER EXTREMITY MMT:    MMT Right eval Left eval  Hip flexion    Hip extension    Hip abduction    Hip adduction    Hip internal rotation    Hip external rotation    Knee flexion    Knee extension    Ankle dorsiflexion    Ankle plantarflexion    Ankle inversion    Ankle eversion     (Blank rows = not tested)  LUMBAR SPECIAL TESTS:  {lumbar special test:25242}  FUNCTIONAL TESTS:  {Functional tests:24029}  GAIT: Distance walked: *** Assistive device utilized: {Assistive devices:23999} Level of assistance: {Levels of assistance:24026} Comments: ***  TODAY'S TREATMENT:                                                                                                                               DATE: ***  05/23/22 See Pt ed  PATIENT EDUCATION:  Education details: PT eval findings, anticipated POC, and initial HEP  Person educated: Patient Education method: Explanation, Demonstration, and Handouts Education comprehension: verbalized understanding and returned demonstration  HOME EXERCISE PROGRAM: ***  ASSESSMENT:  CLINICAL IMPRESSION: Patient is a 76 y.o. male who was seen today for physical therapy evaluation and treatment for low back pain s/p L1 burst fracture. ***. Shaheem reports onset of low back pain beginning ***. Pain is worse with *** and limits pt from *** .  He has deficits in *** ROM, strength and flexibility.  *** has decreased spinal mobility at levels *** and positive neural tension in the *** LE. Patient will benefit from skilled PT to address these deficits.    OBJECTIVE IMPAIRMENTS: {opptimpairments:25111}.   ACTIVITY LIMITATIONS: {activitylimitations:27494}  PARTICIPATION LIMITATIONS: {participationrestrictions:25113}  PERSONAL FACTORS: Age, Fitness, and 3+ comorbidities: PACEMAKER, CAD,CVA, CHF,HTN, Lewy body dementia, h/o seizures, osteoporosis  are also affecting patient's functional outcome.   REHAB POTENTIAL: {rehabpotential:25112}  CLINICAL DECISION MAKING: {clinical decision making:25114}  EVALUATION COMPLEXITY: {Evaluation complexity:25115}   GOALS: Goals reviewed with patient? Yes  SHORT TERM GOALS: Target date: {follow up:25551} (Remove Blue Hyperlink)  Patient will be independent with initial HEP.  Baseline: *** Goal status: {GOALSTATUS:25110}  2.  Patient will report centralization of radicular symptoms.  Baseline: *** Goal status: {GOALSTATUS:25110}  3.  *** Baseline: *** Goal status: {GOALSTATUS:25110}   LONG TERM GOALS: Target date: {follow up:25551}  (Remove Blue Hyperlink)  Patient will be independent with advanced/ongoing HEP to improve  outcomes and carryover.  Baseline: *** Goal status: {GOALSTATUS:25110}  2.  Patient will report 75% improvement in low back pain to improve QOL.  Baseline: *** Goal status: {GOALSTATUS:25110}  3.  Patient will demonstrate full pain free lumbar ROM to perform ADLs.   Baseline: *** Goal status: {GOALSTATUS:25110}  4.  Patient will demonstrate improved functional strength as demonstrated by ***. Baseline: *** Goal status: {GOALSTATUS:25110}  5.  Patient will report *** on lumbar FOTO to demonstrate improved functional ability.  Baseline: *** Goal status: {GOALSTATUS:25110}   6.  Patient will tolerate *** min of (standing/sitting/walking) to perform ***. Baseline: *** Goal status: {GOALSTATUS:25110}  7.  Patient to demonstrate ability to achieve and maintain good spinal alignment/posturing and body mechanics needed for daily activities. Baseline: *** Goal status: {GOALSTATUS:25110}  8. Patient will *** Baseline: *** Goal status: {GOALSTATUS:25110}   PLAN:  PT FREQUENCY: {rehab frequency:25116}  PT DURATION: {rehab duration:25117}  PLANNED INTERVENTIONS: {rehab planned interventions:25118::"Therapeutic exercises","Therapeutic activity","Neuromuscular re-education","Balance training","Gait training","Patient/Family education","Self Care","Joint mobilization"}.  PLAN FOR NEXT SESSION: ***   Jessey Stehlin, PT 05/22/2022, 6:37 PM

## 2022-05-23 ENCOUNTER — Ambulatory Visit: Payer: Medicare Other | Admitting: Physical Therapy

## 2022-05-23 DIAGNOSIS — R279 Unspecified lack of coordination: Secondary | ICD-10-CM | POA: Diagnosis not present

## 2022-05-23 DIAGNOSIS — R419 Unspecified symptoms and signs involving cognitive functions and awareness: Secondary | ICD-10-CM | POA: Diagnosis not present

## 2022-05-23 DIAGNOSIS — G3183 Dementia with Lewy bodies: Secondary | ICD-10-CM | POA: Diagnosis not present

## 2022-05-23 DIAGNOSIS — R41841 Cognitive communication deficit: Secondary | ICD-10-CM | POA: Diagnosis not present

## 2022-05-23 DIAGNOSIS — R413 Other amnesia: Secondary | ICD-10-CM | POA: Diagnosis not present

## 2022-05-23 DIAGNOSIS — R4189 Other symptoms and signs involving cognitive functions and awareness: Secondary | ICD-10-CM | POA: Diagnosis not present

## 2022-05-25 ENCOUNTER — Other Ambulatory Visit (INDEPENDENT_AMBULATORY_CARE_PROVIDER_SITE_OTHER): Payer: Medicare Other

## 2022-05-25 ENCOUNTER — Telehealth: Payer: Self-pay | Admitting: Internal Medicine

## 2022-05-25 ENCOUNTER — Ambulatory Visit: Payer: Medicare Other | Admitting: Physical Therapy

## 2022-05-25 DIAGNOSIS — R300 Dysuria: Secondary | ICD-10-CM

## 2022-05-25 LAB — URINALYSIS, ROUTINE W REFLEX MICROSCOPIC
Bilirubin Urine: NEGATIVE
Ketones, ur: NEGATIVE
Nitrite: NEGATIVE
Specific Gravity, Urine: 1.015 (ref 1.000–1.030)
Urine Glucose: NEGATIVE
Urobilinogen, UA: 0.2 (ref 0.0–1.0)
pH: 6.5 (ref 5.0–8.0)

## 2022-05-25 MED ORDER — AMOXICILLIN-POT CLAVULANATE 875-125 MG PO TABS: 1.0000 | ORAL_TABLET | Freq: Two times a day (BID) | ORAL | 0 refills | Status: DC

## 2022-05-25 NOTE — Telephone Encounter (Signed)
Wife, [name] called to ask if PCP will put in an ORDER for Urinalysis today. Per [name], pt has UTI symptoms and due to his medical history it is dangerous for pt to go untreated.  Please call Jor text [name] [phone]

## 2022-05-25 NOTE — Telephone Encounter (Signed)
Order placed and wife notified.

## 2022-05-28 LAB — CULTURE, URINE COMPREHENSIVE

## 2022-05-30 ENCOUNTER — Ambulatory Visit (INDEPENDENT_AMBULATORY_CARE_PROVIDER_SITE_OTHER): Payer: Medicare Other

## 2022-05-30 ENCOUNTER — Encounter: Payer: Medicare Other | Admitting: Physical Therapy

## 2022-05-30 VITALS — BP 124/60 | HR 60 | Temp 98.0°F | Ht 68.5 in | Wt 243.2 lb

## 2022-05-30 DIAGNOSIS — Z0000 Encounter for general adult medical examination without abnormal findings: Secondary | ICD-10-CM

## 2022-05-30 DIAGNOSIS — R413 Other amnesia: Secondary | ICD-10-CM | POA: Diagnosis not present

## 2022-05-30 DIAGNOSIS — R41841 Cognitive communication deficit: Secondary | ICD-10-CM | POA: Diagnosis not present

## 2022-05-30 DIAGNOSIS — R279 Unspecified lack of coordination: Secondary | ICD-10-CM | POA: Diagnosis not present

## 2022-05-30 DIAGNOSIS — G3183 Dementia with Lewy bodies: Secondary | ICD-10-CM | POA: Diagnosis not present

## 2022-05-30 DIAGNOSIS — R4189 Other symptoms and signs involving cognitive functions and awareness: Secondary | ICD-10-CM | POA: Diagnosis not present

## 2022-05-30 DIAGNOSIS — R419 Unspecified symptoms and signs involving cognitive functions and awareness: Secondary | ICD-10-CM | POA: Diagnosis not present

## 2022-05-30 MED ORDER — CIPROFLOXACIN HCL 500 MG PO TABS: 500.0000 mg | ORAL_TABLET | Freq: Two times a day (BID) | ORAL | 0 refills | Status: AC

## 2022-05-30 NOTE — Addendum Note (Signed)
Addended by: [name] on: 05/30/2022 07:21 AM   Modules accepted: Orders

## 2022-05-30 NOTE — Patient Instructions (Addendum)
Jeff Weaver , Thank you for taking time to come for your Medicare Wellness Visit. I appreciate your ongoing commitment to your health goals. Please review the following plan we discussed and let me know if I can assist you in the future.   These are the goals we discussed:  Goals      My goal is to get my back and legs stronger.        This is a list of the screening recommended for you and due dates:  Health Maintenance  Topic Date Due   DEXA scan (bone density measurement)  10/22/2018   COVID-19 Vaccine (7 - 2023-24 season) 03/04/2022   Medicare Annual Wellness Visit  05/31/2023   Pneumonia Vaccine  Completed   Flu Shot  Completed   Hepatitis C Screening: USPSTF Recommendation to screen - Ages 18-79 yo.  Completed   Zoster (Shingles) Vaccine  Completed   HPV Vaccine  Aged Out   Colon Cancer Screening  Discontinued   Immunization History  Administered Date(s) Administered   DTP 01/07/1997   Fluad Quad(high Dose 65+) 03/06/2019, 04/23/2020, 03/21/2022   Influenza Split 04/10/2012   Influenza Whole 05/05/2011, 04/10/2012   Influenza, High Dose Seasonal PF 04/04/2013, 03/22/2014, 05/01/2015, 03/16/2016, 04/20/2017, 05/17/2018   Influenza-Unspecified 07/13/1999, 05/18/2000, 08/16/2001, 04/29/2002, 05/17/2004, 05/05/2005, 04/20/2006, 04/04/2007, 04/21/2008, 05/05/2009, 04/13/2010, 04/03/2012, 04/04/2013, 07/19/2014, 04/10/2015, 03/21/2022   PFIZER Comirnaty(Gray Top)Covid-19 Tri-Sucrose Vaccine 08/01/2019, 05/08/2020, 03/24/2021   PFIZER(Purple Top)SARS-COV-2 Vaccination 08/01/2019, 08/26/2019, 05/08/2020, 03/24/2021   Pneumococcal Conjugate-13 08/03/2009, 08/04/2011   Pneumococcal Polysaccharide-23 06/19/2006, 08/04/2011, 07/25/2016   Tdap 07/25/2011   Zoster Recombinat (Shingrix) 05/15/2017, 07/21/2017   Zoster, Live 06/06/2010    Advanced directives: Yes  Conditions/risks identified: Yes  Next appointment: Follow up in one year for your annual wellness visit.   Preventive  Care 80 Years and Older, Male  Preventive care refers to lifestyle choices and visits with your health care provider that can promote health and wellness. What does preventive care include? A yearly physical exam. This is also called an annual well check. Dental exams once or twice a year. Routine eye exams. Ask your health care provider how often you should have your eyes checked. Personal lifestyle choices, including: Daily care of your teeth and gums. Regular physical activity. Eating a healthy diet. Avoiding tobacco and drug use. Limiting alcohol use. Practicing safe sex. Taking low doses of aspirin every day. Taking vitamin and mineral supplements as recommended by your health care provider. What happens during an annual well check? The services and screenings done by your health care provider during your annual well check will depend on your age, overall health, lifestyle risk factors, and family history of disease. Counseling  Your health care provider may ask you questions about your: Alcohol use. Tobacco use. Drug use. Emotional well-being. Home and relationship well-being. Sexual activity. Eating habits. History of falls. Memory and ability to understand (cognition). Work and work Statistician. Screening  You may have the following tests or measurements: Height, weight, and BMI. Blood pressure. Lipid and cholesterol levels. These may be checked every 5 years, or more frequently if you are over 19 years old. Skin check. Lung cancer screening. You may have this screening every year starting at age 68 if you have a 30-pack-year history of smoking and currently smoke or have quit within the past 15 years. Fecal occult blood test (FOBT) of the stool. You may have this test every year starting at age 59. Flexible sigmoidoscopy or colonoscopy. You may have a sigmoidoscopy  every 5 years or a colonoscopy every 10 years starting at age 43. Prostate cancer screening.  Recommendations will vary depending on your family history and other risks. Hepatitis C blood test. Hepatitis B blood test. Sexually transmitted disease (STD) testing. Diabetes screening. This is done by checking your blood sugar (glucose) after you have not eaten for a while (fasting). You may have this done every 1-3 years. Abdominal aortic aneurysm (AAA) screening. You may need this if you are a current or former smoker. Osteoporosis. You may be screened starting at age 26 if you are at high risk. Talk with your health care provider about your test results, treatment options, and if necessary, the need for more tests. Vaccines  Your health care provider may recommend certain vaccines, such as: Influenza vaccine. This is recommended every year. Tetanus, diphtheria, and acellular pertussis (Tdap, Td) vaccine. You may need a Td booster every 10 years. Zoster vaccine. You may need this after age 29. Pneumococcal 13-valent conjugate (PCV13) vaccine. One dose is recommended after age 18. Pneumococcal polysaccharide (PPSV23) vaccine. One dose is recommended after age 47. Talk to your health care provider about which screenings and vaccines you need and how often you need them. This information is not intended to replace advice given to you by your health care provider. Make sure you discuss any questions you have with your health care provider. Document Released: 07/17/2015 Document Revised: 03/09/2016 Document Reviewed: 04/21/2015 Elsevier Interactive Patient Education  2017 [place] Prevention in the Home Falls can cause injuries. They can happen to people of all ages. There are many things you can do to make your home safe and to help prevent falls. What can I do on the outside of my home? Regularly fix the edges of walkways and driveways and fix any cracks. Remove anything that might make you trip as you walk through a door, such as a raised step or threshold. Trim any bushes or  trees on the path to your home. Use bright outdoor lighting. Clear any walking paths of anything that might make someone trip, such as rocks or tools. Regularly check to see if handrails are loose or broken. Make sure that both sides of any steps have handrails. Any raised decks and porches should have guardrails on the edges. Have any leaves, snow, or ice cleared regularly. Use sand or salt on walking paths during winter. Clean up any spills in your garage right away. This includes oil or grease spills. What can I do in the bathroom? Use night lights. Install grab bars by the toilet and in the tub and shower. Do not use towel bars as grab bars. Use non-skid mats or decals in the tub or shower. If you need to sit down in the shower, use a plastic, non-slip stool. Keep the floor dry. Clean up any water that spills on the floor as soon as it happens. Remove soap buildup in the tub or shower regularly. Attach bath mats securely with double-sided non-slip rug tape. Do not have throw rugs and other things on the floor that can make you trip. What can I do in the bedroom? Use night lights. Make sure that you have a light by your bed that is easy to reach. Do not use any sheets or blankets that are too big for your bed. They should not hang down onto the floor. Have a firm chair that has side arms. You can use this for support while you get dressed. Do not have  throw rugs and other things on the floor that can make you trip. What can I do in the kitchen? Clean up any spills right away. Avoid walking on wet floors. Keep items that you use a lot in easy-to-reach places. If you need to reach something above you, use a strong step stool that has a grab bar. Keep electrical cords out of the way. Do not use floor polish or wax that makes floors slippery. If you must use wax, use non-skid floor wax. Do not have throw rugs and other things on the floor that can make you trip. What can I do with my  stairs? Do not leave any items on the stairs. Make sure that there are handrails on both sides of the stairs and use them. Fix handrails that are broken or loose. Make sure that handrails are as long as the stairways. Check any carpeting to make sure that it is firmly attached to the stairs. Fix any carpet that is loose or worn. Avoid having throw rugs at the top or bottom of the stairs. If you do have throw rugs, attach them to the floor with carpet tape. Make sure that you have a light switch at the top of the stairs and the bottom of the stairs. If you do not have them, ask someone to add them for you. What else can I do to help prevent falls? Wear shoes that: Do not have high heels. Have rubber bottoms. Are comfortable and fit you well. Are closed at the toe. Do not wear sandals. If you use a stepladder: Make sure that it is fully opened. Do not climb a closed stepladder. Make sure that both sides of the stepladder are locked into place. Ask someone to hold it for you, if possible. Clearly mark and make sure that you can see: Any grab bars or handrails. First and last steps. Where the edge of each step is. Use tools that help you move around (mobility aids) if they are needed. These include: Canes. Walkers. Scooters. Crutches. Turn on the lights when you go into a dark area. Replace any light bulbs as soon as they burn out. Set up your furniture so you have a clear path. Avoid moving your furniture around. If any of your floors are uneven, fix them. If there are any pets around you, be aware of where they are. Review your medicines with your doctor. Some medicines can make you feel dizzy. This can increase your chance of falling. Ask your doctor what other things that you can do to help prevent falls. This information is not intended to replace advice given to you by your health care provider. Make sure you discuss any questions you have with your health care provider. Document  Released: 04/16/2009 Document Revised: 11/26/2015 Document Reviewed: 07/25/2014 Elsevier Interactive Patient Education  2017 [employer].

## 2022-05-30 NOTE — Progress Notes (Signed)
Subjective:   Jeff Weaver is a 76 y.o. male who presents for Medicare Annual/Subsequent preventive examination.  Review of Systems     Cardiac Risk Factors include: advanced age (>1mn, >>24women);dyslipidemia;family history of premature cardiovascular disease;hypertension;male gender;obesity (BMI >30kg/m2);sedentary lifestyle     Objective:    Today's Vitals   05/30/22 1122  BP: 124/60  Pulse: 60  Temp: 98 F (36.7 C)  SpO2: 92%  Weight: 243 lb 3.2 oz (110.3 kg)  Height: 5' 8.5" (1.74 m)  PainSc: 4   PainLoc: Hip   Body mass index is 36.44 kg/m.     05/30/2022   11:40 AM 07/14/2021    2:41 PM 03/10/2021    1:22 PM 05/28/2019    1:40 PM 07/02/2018    7:56 AM 05/17/2018    5:01 PM 01/12/2018    9:02 AM  Advanced Directives  Does Patient Have a Medical Advance Directive? Yes Yes Yes Yes Yes No Yes  Type of AParamedicof [place]aser[place]Living will HV[place]Living will [place] Land[place]Living will [place]Living will Healthcare Power of [place]enterv[place] Does patient want to make changes to medical advance directive?     No - Patient declined  No - Patient declined  Copy of [place] Chart? No - copy requested No - copy requested  No - copy requested No - copy requested  No - copy requested  Would patient like information on creating a medical advance directive?     No - Patient declined Yes (Inpatient - patient requests chaplain consult to create a medical advance directive) No - Patient declined    Current Medications (verified) Outpatient Encounter Medications as of 05/30/2022  Medication Sig   acetaminophen (TYLENOL) 325 MG tablet Take 325-650 mg by mouth every 6 (six) hours as needed (pain/headaches.).   amoxicillin-clavulanate (AUGMENTIN) 875-125 MG tablet Take 1 tablet by mouth 2 (two) times daily for 7 days.   cetirizine (ZYRTEC) 10 MG tablet Take 1  tablet (10 mg total) by mouth daily.   Cholecalciferol (VITAMIN D) 50 MCG (2000 UT) CAPS Take 2,000 Units by mouth in the morning.   ciprofloxacin (CIPRO) 500 MG tablet Take 1 tablet (500 mg total) by mouth 2 (two) times daily for 5 days.   clonazePAM (KLONOPIN) 1 MG disintegrating tablet Take 1 mg by mouth as directed. ALLOW 1 TABLET TO DISSOLVE IN CHEEK FOR SEIZURES LASTING LONGER THAN 4 MINUTES. OR AFTER 2ND SEIZURE IN 24 HOURS   donepezil (ARICEPT) 10 MG tablet Take half a tablet in the morning for a month and then increase to 1 full tablet daily.   furosemide (LASIX) 20 MG tablet Take 1 tablet by mouth daily.   HYDROcodone-acetaminophen (NORCO) 5-325 MG tablet Take 1 tablet by mouth 3 (three) times daily as needed. (Patient not taking: Reported on 05/30/2022)   levETIRAcetam (KEPPRA) 500 MG tablet Take 3.5 tablets by mouth in the morning and at bedtime.   loperamide (IMODIUM) 2 MG capsule Take 2 mg by mouth as needed.   LORazepam (ATIVAN) 1 MG tablet Take 1 mg by mouth every 6 (six) hours as needed for seizure.    metoprolol succinate (TOPROL-XL) 25 MG 24 hr tablet Take 1 tablet (25 mg total) by mouth daily.   Midazolam (NAYZILAM) 5 MG/0.1ML SOLN Place 1 each into the nose as needed (seizure).   mirabegron ER (MYRBETRIQ) 50 MG TB24 tablet Take 1 tablet (50  mg total) by mouth daily.   nitroGLYCERIN (NITROSTAT) 0.4 MG SL tablet PLACE 1 TABLET (0.4 MG TOTAL) UNDER THE TONGUE EVERY 5 (FIVE) MINUTES AS NEEDED FOR CHEST PAIN. (Patient taking differently: Place 0.4 mg under the tongue every 5 (five) minutes x 3 doses as needed for chest pain.)   NON FORMULARY CPAP Machine: authorized by [facility] in [place]    nystatin ointment (MYCOSTATIN) Apply 1 Application topically 2 (two) times daily.   Propylene Glycol (SYSTANE BALANCE) 0.6 % SOLN Apply 1 drop to eye daily as needed (dry eyes).   pyridOXINE (VITAMIN B-6) 100 MG tablet Take 100 mg by mouth in the morning.   rosuvastatin (CRESTOR) 5 MG tablet TAKE 1  TABLET BY MOUTH THREE TIMES A WEEK AND INCREASE AS TOLERATED (Patient taking differently: Take 5 mg by mouth every Monday, Wednesday, and Friday. In the evening)   Tamsulosin HCl (FLOMAX) 0.4 MG CAPS Take 0.4 mg by mouth every evening.   thiamine 100 MG tablet Take 100 mg by mouth in the morning.   torsemide (DEMADEX) 20 MG tablet Take 0.5 tablets (10 mg total) by mouth daily.   traZODone (DESYREL) 50 MG tablet Take 1 tablet by mouth at bedtime.   triamcinolone cream (KENALOG) 0.5 % Apply 1 application. topically 3 (three) times daily.   VIMPAT 200 MG TABS tablet Take 200 mg by mouth 2 (two) times daily.   vitamin B-12 (CYANOCOBALAMIN) 1000 MCG tablet Take 1,000 mcg by mouth in the morning.   No facility-administered encounter medications on file as of 05/30/2022.    Allergies (verified) Haloperidol, Rosuvastatin, Simvastatin, Lisinopril, Nifedipine, and Pravastatin   History: Past Medical History:  Diagnosis Date   Aortic insufficiency    a. mild 2017 echo.   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 s/p clipping at [facility] in 8/13 c/b short-term memory loss, cerebral hemorrhage and seizure disorder s/p VP shunt.   Cerebral hemorrhage (HCC)    Chronic combined systolic and diastolic CHF (congestive heart failure) (HCC)    CVA (cerebral infarction)    Diverticulosis    Enteritis (regional)    Dr Olevia Perches   First degree AV block    GI bleed    HTN (hypertension)    Hyperlipidemia    Hypertensive heart disease    IBS (irritable bowel syndrome)    Lewy body dementia ([place])    Mild CAD    Cardiac cath 01/12/18 showed minimal nonobstructive CAD, normal LVEF, normal LVEDP.   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   Rheumatoid arthritis(714.0)     dxed in [employer] in 1980s   Second degree AV block, Mobitz type I    Seizures ([place])    last April 26,2021   Sinus bradycardia    Sleep apnea    SSS (sick sinus syndrome) (HCC)    Subdural hematoma (HCC)    Past Surgical History:  Procedure Laterality Date   BIV PACEMAKER INSERTION CRT-P N/A 03/10/2021   Procedure: BIV PACEMAKER INSERTION CRT-P UPGRADE;  Surgeon: Constance Haw, MD;  Location: [place] [facility];  Service: Cardiovascular;  Laterality: N/A;   CHOLECYSTECTOMY N/A 01/07/2016   Procedure: LAPAROSCOPIC CHOLECYSTECTOMY ;  Surgeon: Coralie Keens, MD;  Location: [place];  Service: General;  Laterality:  N/A;   cns shunt  02/23/12   COLONOSCOPY W/ POLYPECTOMY  1978   negative 2009, Dr Olevia Perches. Due 2014   CRANIOTOMY  02/02/12   Dr Harvel Ricks, [place] of aneurysm   CRANIOTOMY  02-02-12   left pterional craniotomy for clipping complex anterior communicating artery aneurysm    HERNIA REPAIR     LEFT HEART CATH AND CORONARY ANGIOGRAPHY N/A 01/12/2018   Procedure: LEFT HEART CATH AND CORONARY ANGIOGRAPHY;  Surgeon: Martinique, Peter M, MD;  Location: [place] [facility];  Service: Cardiovascular;  Laterality: N/A;   LEFT HEART CATHETERIZATION WITH CORONARY ANGIOGRAM N/A 11/26/2013   Procedure: LEFT HEART CATHETERIZATION WITH CORONARY ANGIOGRAM;  Surgeon: [name], MD;  Location: [facility];  Service: Cardiovascular;  Laterality: N/A;   NOSE SURGERY     PACEMAKER IMPLANT N/A 07/02/2018   Procedure: PACEMAKER IMPLANT;  Surgeon: Constance Haw, MD;  Location: [place] [facility];  Service: Cardiovascular;  Laterality: N/A;   SHOULDER SURGERY  1997   TONSILLECTOMY AND ADENOIDECTOMY     VENTRICULOPERITONEAL SHUNT  02-23-12   INSERTION OF RIGHT FRONTAL VENTRICULOPERITONEAL SHUNT WITH A CODMAN HAKIM PROGRAMMABLE VALVE   Family History  Problem Relation Age of Onset   COPD Father    Coronary artery disease Father        MI in 21s   Hepatitis Mother    Diabetes Sister    Dementia  Sister 61   Diabetes Brother    Colon cancer Neg Hx    Esophageal cancer Neg Hx    Rectal cancer Neg Hx    Stomach cancer Neg Hx    Colon polyps Neg Hx    Social History   Socioeconomic History   Marital status: Married    Spouse name: Not on file   Number of children: 1   Years of education: Not on file   Highest education level: Not on file  Occupational History   Occupation: retired/ Education officer, community  Tobacco Use   Smoking status: Former    Packs/day: 0.00    Years: 30.00    Total pack years: 0.00    Types: Pipe, Cigarettes    Quit date: 07/04/2008    Years since quitting: 13.9   Smokeless tobacco: Former  Scientific laboratory technician Use: Never used  Substance and Sexual Activity   Alcohol use: Yes    Alcohol/week: 0.0 standard drinks of alcohol    Comment: Very Infrequently    Drug use: No   Sexual activity: Not Currently  Other Topics Concern   Not on file  Social History Narrative   Not on file   Social Determinants of Health   Financial Resource Strain: Low Risk  (05/30/2022)   Overall Financial Resource Strain (CARDIA)    Difficulty of Paying Living Expenses: Not hard at all  Food Insecurity: No Food Insecurity (05/30/2022)   Hunger Vital Sign    Worried About Running Out of Food in the Last Year: Never true    Ran Out of Food in the Last Year: Never true  Transportation Needs: No Transportation Needs (05/30/2022)   PRAPARE - Hydrologist (Medical): No    Lack of Transportation (Non-Medical): No  Physical Activity: Insufficiently Active (05/30/2022)   Exercise Vital Sign    Days of Exercise per Week: 3 days    Minutes of Exercise per Session: 30 min  Stress: No Stress Concern Present (05/30/2022)   [place]  Questionnaire    Feeling of Stress : Only a little  Social Connections: Moderately Isolated (05/30/2022)   Social Connection and Isolation Panel [NHANES]    Frequency of  Communication with Friends and Family: More than three times a week    Frequency of Social Gatherings with Friends and Family: More than three times a week    Attends Religious Services: Never    Marine scientist or Organizations: No    Attends Music therapist: Never    Marital Status: Married    Tobacco Counseling Counseling given: Not Answered   Clinical Intake:  Pre-visit preparation completed: Yes  Pain : 0-10 Pain Score: 4  Pain Type: Chronic pain Pain Location: Hip     BMI - recorded: 36.44 Nutritional Risks: None Diabetes: No  How often do you need to have someone help you when you read instructions, pamphlets, or other written materials from your doctor or pharmacy?: 1 - Never What is the last grade level you completed in school?: HSG  Diabetic? no  Interpreter Needed?: No  Information entered by :: Lisette Abu, LPN.   Activities of Daily Living    05/30/2022   12:16 PM  In your present state of health, do you have any difficulty performing the following activities:  Hearing? 1  Vision? 1  Difficulty concentrating or making decisions? 1  Walking or climbing stairs? 1  Dressing or bathing? 1  Doing errands, shopping? 1  Preparing Food and eating ? N  Using the Toilet? N  In the past six months, have you accidently leaked urine? N  Do you have problems with loss of bowel control? N  Managing your Medications? Y  Managing your Finances? Y  Housekeeping or managing your Housekeeping? Y    Patient Care Team: [name], MD as PCP - General (Internal Medicine) Constance Haw, MD as PCP - Electrophysiology (Cardiology) Sueanne Margarita, MD as PCP - Cardiology (Cardiology) Conner, Carney Harder (Neurology) Inocente Salles Aline August, MD as Referring Physician (Neurology) Burman Foster, MD as Physician Assistant (Urology) Florina Ou, CCC-SLP as Speech Language Pathologist (Speech Pathology)  Indicate any recent Medical  Services you may have received from other than [facility] providers in the past year (date may be approximate).     Assessment:   This is a routine wellness examination for Jeff Weaver.  Hearing/Vision screen Hearing Screening - Comments:: Patient wears hearing aids. Vision Screening - Comments:: Patient has double vision.  [facility]  Dietary issues and exercise activities discussed: Current Exercise Habits: Home exercise routine, Type of exercise: walking, Time (Minutes): 30, Frequency (Times/Week): 3, Weekly Exercise (Minutes/Week): 90, Intensity: Mild, Exercise limited by: respiratory conditions(s);neurologic condition(s);orthopedic condition(s);cardiac condition(s)   Goals Addressed             This Visit's Progress    My goal is to get my back and legs stronger.        Depression Screen    05/30/2022   11:23 AM 05/09/2022    9:40 AM 10/28/2020    3:59 PM 05/28/2019   11:33 AM 05/17/2018    5:01 PM 04/28/2017   10:04 AM 04/20/2017    8:38 AM  PHQ 2/9 Scores  PHQ - 2 Score 0 0 0 2 3 3 1   PHQ- 9 Score    5 6 6      Fall Risk    05/30/2022   11:23 AM 05/09/2022   10:08 AM 05/09/2022    9:40  AM 03/21/2022   11:22 AM 10/28/2020    3:50 PM  Fall Risk   Falls in the past year? 0 0 0 0 1  Number falls in past yr: 0 0 0 0 0  Injury with Fall? 0 0 0 0 1  Risk for fall due to : No Fall Risks  No Fall Risks No Fall Risks No Fall Risks  Follow up Falls prevention discussed Falls evaluation completed Falls evaluation completed Falls evaluation completed Falls evaluation completed    [place]:  Any stairs in or around the home? Yes  If so, are there any without handrails? No  Home free of loose throw rugs in walkways, pet beds, electrical cords, etc? Yes  Adequate lighting in your home to reduce risk of falls? Yes   ASSISTIVE DEVICES UTILIZED TO PREVENT FALLS:  Life alert? No  Use of a cane, walker or w/c? Yes  Grab bars in the  bathroom? Yes  Shower chair or bench in shower? Yes  Elevated toilet seat or a handicapped toilet? No   TIMED UP AND GO:  Was the test performed? Yes .  Length of time to ambulate 10 feet: 13 sec.   Gait slow and steady with assistive device  Cognitive Function:    05/30/2022   12:17 PM 05/17/2018    5:11 PM 04/28/2017   10:13 AM  MMSE - Mini Mental State Exam  Not completed: Unable to complete    Orientation to time  5 4  Orientation to Place  5 4  Registration  3 3  Attention/ Calculation  3 4  Recall  2 1  Language- name 2 objects  2 2  Language- repeat  1 1  Language- follow 3 step command  3 2  Language- read & follow direction  1 1  Write a sentence  1 1  Copy design  1 1  Total score  27 24        05/30/2022   11:23 AM  6CIT Screen  What Year? 0 points  What month? 0 points  What time? 0 points  Count back from 20 0 points  Months in reverse 0 points  Repeat phrase 0 points  Total Score 0 points    Immunizations Immunization History  Administered Date(s) Administered   DTP 01/07/1997   Fluad Quad(high Dose 65+) 03/06/2019, 04/23/2020, 03/21/2022   Influenza Split 04/10/2012   Influenza Whole 05/05/2011, 04/10/2012   Influenza, High Dose Seasonal PF 04/04/2013, 03/22/2014, 05/01/2015, 03/16/2016, 04/20/2017, 05/17/2018   Influenza-Unspecified 07/13/1999, 05/18/2000, 08/16/2001, 04/29/2002, 05/17/2004, 05/05/2005, 04/20/2006, 04/04/2007, 04/21/2008, 05/05/2009, 04/13/2010, 04/03/2012, 04/04/2013, 07/19/2014, 04/10/2015, 03/21/2022   PFIZER Comirnaty(Gray Top)Covid-19 Tri-Sucrose Vaccine 08/01/2019, 05/08/2020, 03/24/2021   PFIZER(Purple Top)SARS-COV-2 Vaccination 08/01/2019, 08/26/2019, 05/08/2020, 03/24/2021   Pneumococcal Conjugate-13 08/03/2009, 08/04/2011   Pneumococcal Polysaccharide-23 06/19/2006, 08/04/2011, 07/25/2016   Tdap 07/25/2011   Zoster Recombinat (Shingrix) 05/15/2017, 07/21/2017   Zoster, Live 06/06/2010    TDAP status: Due,  Education has been provided regarding the importance of this vaccine. Advised may receive this vaccine at local pharmacy or Health Dept. Aware to provide a copy of the vaccination record if obtained from local pharmacy or Health Dept. Verbalized acceptance and understanding.  Flu Vaccine status: Up to date  Pneumococcal vaccine status: Up to date  Covid-19 vaccine status: Completed vaccines  Qualifies for Shingles Vaccine? Yes   Zostavax completed Yes   Shingrix Completed?: Yes  Screening Tests Health Maintenance  Topic Date Due   DEXA  SCAN  10/22/2018   COVID-19 Vaccine (7 - 2023-24 season) 03/04/2022   Medicare Annual Wellness (AWV)  05/31/2023   Pneumonia Vaccine 75+ Years old  Completed   INFLUENZA VACCINE  Completed   Hepatitis C Screening  Completed   Zoster Vaccines- Shingrix  Completed   HPV VACCINES  Aged Out   COLONOSCOPY (Pts 45-88yr Insurance coverage will need to be confirmed)  Discontinued    Health Maintenance  Health Maintenance Due  Topic Date Due   DEXA SCAN  10/22/2018   COVID-19 Vaccine (7 - 2023-24 season) 03/04/2022    Colorectal cancer screening: No longer required.   Lung Cancer Screening: (Low Dose CT Chest recommended if Age 78-80years, 30 pack-year currently smoking OR have quit w/in 15years.) does not qualify.   Lung Cancer Screening Referral: no  Additional Screening:  Hepatitis C Screening: does qualify; Completed 07/25/2016  Vision Screening: Recommended annual ophthalmology exams for early detection of glaucoma and other disorders of the eye. Is the patient up to date with their annual eye exam?  Yes  Who is the provider or what is the name of the office in which the patient attends annual eye exams? [facility] If pt is not established with a provider, would they like to be referred to a provider to establish care? No .   Dental Screening: Recommended annual dental exams for proper oral hygiene  Community Resource Referral / Chronic  Care Management: CRR required this visit?  No   CCM required this visit?  No      Plan:     I have personally reviewed and noted the following in the patient's chart:   Medical and social history Use of alcohol, tobacco or illicit drugs  Current medications and supplements including opioid prescriptions. Patient is currently taking opioid prescriptions. Information provided to patient regarding non-opioid alternatives. Patient advised to discuss non-opioid treatment plan with their provider. Functional ability and status Nutritional status Physical activity Advanced directives List of other physicians Hospitalizations, surgeries, and ER visits in previous 12 months Vitals Screenings to include cognitive, depression, and falls Referrals and appointments  In addition, I have reviewed and discussed with patient certain preventive protocols, quality metrics, and best practice recommendations. A written personalized care plan for preventive services as well as general preventive health recommendations were provided to patient.     SSheral Flow LPN   [id number]  Nurse Notes:  Cognitive status assessed by direct observation.  Patient has current diagnosis of cognitive impairment.  Patient is followed by neurology for ongoing assessment. Patient is unable to complete screening 6CIT or MMSE.

## 2022-05-31 ENCOUNTER — Ambulatory Visit (INDEPENDENT_AMBULATORY_CARE_PROVIDER_SITE_OTHER): Payer: Medicare Other

## 2022-05-31 DIAGNOSIS — I428 Other cardiomyopathies: Secondary | ICD-10-CM | POA: Diagnosis not present

## 2022-06-01 LAB — CUP PACEART REMOTE DEVICE CHECK
Battery Remaining Longevity: 119 mo
Battery Voltage: 3.01 V
Brady Statistic AP VP Percent: 85.18 %
Brady Statistic AP VS Percent: 0.01 %
Brady Statistic AS VP Percent: 13.78 %
Brady Statistic AS VS Percent: 1.03 %
Brady Statistic RA Percent Paced: 85.6 %
Brady Statistic RV Percent Paced: 98.96 %
Date Time Interrogation Session: 20231129034740
Implantable Lead Connection Status: 753985
Implantable Lead Connection Status: 753985
Implantable Lead Connection Status: 753985
Implantable Lead Implant Date: 20191230
Implantable Lead Implant Date: 20191230
Implantable Lead Implant Date: 20220907
Implantable Lead Location: 753858
Implantable Lead Location: 753859
Implantable Lead Location: 753860
Implantable Lead Model: 4598
Implantable Lead Model: 5076
Implantable Lead Model: 5076
Implantable Pulse Generator Implant Date: 20220907
Lead Channel Impedance Value: 304 Ohm
Lead Channel Impedance Value: 342 Ohm
Lead Channel Impedance Value: 399 Ohm
Lead Channel Impedance Value: 399 Ohm
Lead Channel Impedance Value: 399 Ohm
Lead Channel Impedance Value: 399 Ohm
Lead Channel Impedance Value: 456 Ohm
Lead Channel Impedance Value: 494 Ohm
Lead Channel Impedance Value: 589 Ohm
Lead Channel Impedance Value: 627 Ohm
Lead Channel Impedance Value: 646 Ohm
Lead Channel Impedance Value: 722 Ohm
Lead Channel Impedance Value: 741 Ohm
Lead Channel Impedance Value: 741 Ohm
Lead Channel Pacing Threshold Amplitude: 0.625 V
Lead Channel Pacing Threshold Amplitude: 0.75 V
Lead Channel Pacing Threshold Amplitude: 1.125 V
Lead Channel Pacing Threshold Pulse Width: 0.4 ms
Lead Channel Pacing Threshold Pulse Width: 0.4 ms
Lead Channel Pacing Threshold Pulse Width: 0.4 ms
Lead Channel Sensing Intrinsic Amplitude: 1.625 mV
Lead Channel Sensing Intrinsic Amplitude: 1.625 mV
Lead Channel Sensing Intrinsic Amplitude: 20.625 mV
Lead Channel Sensing Intrinsic Amplitude: 20.625 mV
Lead Channel Setting Pacing Amplitude: 1.25 V
Lead Channel Setting Pacing Amplitude: 1.5 V
Lead Channel Setting Pacing Amplitude: 2.25 V
Lead Channel Setting Pacing Pulse Width: 0.4 ms
Lead Channel Setting Pacing Pulse Width: 0.4 ms
Lead Channel Setting Sensing Sensitivity: 1.2 mV
Zone Setting Status: 755011
Zone Setting Status: 755011

## 2022-06-02 ENCOUNTER — Encounter: Payer: Medicare Other | Admitting: Physical Therapy

## 2022-06-02 DIAGNOSIS — B351 Tinea unguium: Secondary | ICD-10-CM | POA: Diagnosis not present

## 2022-06-02 DIAGNOSIS — M79672 Pain in left foot: Secondary | ICD-10-CM | POA: Diagnosis not present

## 2022-06-02 DIAGNOSIS — M79671 Pain in right foot: Secondary | ICD-10-CM | POA: Diagnosis not present

## 2022-06-09 DIAGNOSIS — K117 Disturbances of salivary secretion: Secondary | ICD-10-CM | POA: Diagnosis not present

## 2022-06-09 DIAGNOSIS — F0280 Dementia in other diseases classified elsewhere without behavioral disturbance: Secondary | ICD-10-CM | POA: Diagnosis not present

## 2022-06-09 DIAGNOSIS — G20C Parkinsonism, unspecified: Secondary | ICD-10-CM | POA: Diagnosis not present

## 2022-06-09 DIAGNOSIS — J310 Chronic rhinitis: Secondary | ICD-10-CM | POA: Diagnosis not present

## 2022-06-09 DIAGNOSIS — G3183 Dementia with Lewy bodies: Secondary | ICD-10-CM | POA: Diagnosis not present

## 2022-06-09 NOTE — Progress Notes (Signed)
Electrophysiology Office Note Date: 06/10/2022  ID:  Seanpatrick, Maisano [birth year], MRN [id number]  PCP: [name], MD Primary Cardiologist: Fransico Him, MD Electrophysiologist: Constance Haw, MD   CC: Pacemaker follow-up  KEAHI MCCARNEY is a 76 y.o. male seen today for Will Meredith Leeds, MD for routine electrophysiology followup. Since last being seen in our clinic the patient reports doing well from a cardiac perspective.  he denies chest pain, palpitations, dyspnea, PND, orthopnea, nausea, vomiting, dizziness, syncope, edema, weight gain, or early satiety.   Device History: Medtronic Dual Chamber PPM implanted 04/2012 for symptomatic bradycardia, BIV upgrade 03/2021 for CHF  Past Medical History:  Diagnosis Date   Aortic insufficiency    a. mild 2017 echo.   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 s/p clipping at [facility] in 8/13 c/b short-term memory loss, cerebral hemorrhage and seizure disorder s/p VP shunt.   Cerebral hemorrhage (HCC)    Chronic combined systolic and diastolic CHF (congestive heart failure) (HCC)    CVA (cerebral infarction)    Diverticulosis    Enteritis (regional)    Dr Olevia Perches   First degree AV block    GI bleed    HTN (hypertension)    Hyperlipidemia    Hypertensive heart disease    IBS (irritable bowel syndrome)    Lewy body dementia ([place])    Mild CAD    Cardiac cath 01/12/18 showed minimal nonobstructive CAD, normal LVEF, normal LVEDP.   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   Rheumatoid arthritis(714.0)    dxed in [employer] in 1980s   Second degree AV block, Mobitz type I    Seizures ([place])    last April 26,2021   Sinus bradycardia    Sleep apnea    SSS (sick sinus syndrome) (HCC)     Subdural hematoma (HCC)    Past Surgical History:  Procedure Laterality Date   BIV PACEMAKER INSERTION CRT-P N/A 03/10/2021   Procedure: BIV PACEMAKER INSERTION CRT-P UPGRADE;  Surgeon: Constance Haw, MD;  Location: [place] [facility];  Service: Cardiovascular;  Laterality: N/A;   CHOLECYSTECTOMY N/A 01/07/2016   Procedure: LAPAROSCOPIC CHOLECYSTECTOMY ;  Surgeon: Coralie Keens, MD;  Location: [place];  Service: General;  Laterality: N/A;   cns shunt  02/23/12   COLONOSCOPY W/ POLYPECTOMY  1978   negative 2009, Dr Olevia Perches. Due 2014   CRANIOTOMY  02/02/12   Dr Harvel Ricks, [place] of aneurysm   CRANIOTOMY  02-02-12   left pterional craniotomy for clipping complex anterior communicating artery aneurysm    HERNIA REPAIR     LEFT HEART CATH AND CORONARY ANGIOGRAPHY N/A 01/12/2018   Procedure: LEFT HEART CATH AND CORONARY ANGIOGRAPHY;  Surgeon: Martinique, Peter M, MD;  Location: [place] [facility];  Service: Cardiovascular;  Laterality: N/A;   LEFT HEART CATHETERIZATION WITH CORONARY ANGIOGRAM N/A 11/26/2013   Procedure: LEFT HEART CATHETERIZATION WITH CORONARY ANGIOGRAM;  Surgeon: [name], MD;  Location: [facility] C[facility];  Service: Cardiovascular;  Laterality: N/A;   NOSE SURGERY     PACEMAKER IMPLANT N/A 07/02/2018   Procedure: PACEMAKER IMPLANT;  Surgeon: Constance Haw, MD;  Location: [place] [facility];  Service: Cardiovascular;  Laterality: N/A;   SHOULDER  SURGERY  1997   TONSILLECTOMY AND ADENOIDECTOMY     VENTRICULOPERITONEAL SHUNT  02-23-12   INSERTION OF RIGHT FRONTAL VENTRICULOPERITONEAL SHUNT WITH A CODMAN HAKIM PROGRAMMABLE VALVE    Current Outpatient Medications  Medication Sig Dispense Refill   acetaminophen (TYLENOL) 325 MG tablet Take 325-650 mg by mouth every 6 (six) hours as needed (pain/headaches.).     carbidopa-levodopa (SINEMET IR) 25-100 MG tablet as directed.     cetirizine (ZYRTEC) 10 MG tablet Take 1 tablet (10 mg total) by mouth daily. 30 tablet 11    Cholecalciferol (VITAMIN D) 50 MCG (2000 UT) CAPS Take 2,000 Units by mouth in the morning.     clonazePAM (KLONOPIN) 1 MG disintegrating tablet Take 1 mg by mouth as directed. ALLOW 1 TABLET TO DISSOLVE IN CHEEK FOR SEIZURES LASTING LONGER THAN 4 MINUTES. OR AFTER 2ND SEIZURE IN 24 HOURS     donepezil (ARICEPT) 10 MG tablet Take half a tablet in the morning for a month and then increase to 1 full tablet daily. 30 tablet 5   furosemide (LASIX) 20 MG tablet Take 1 tablet by mouth daily.     HYDROcodone-acetaminophen (NORCO) 5-325 MG tablet Take 1 tablet by mouth 3 (three) times daily as needed. 20 tablet 0   ipratropium (ATROVENT) 0.06 % nasal spray Place into the nose as needed for rhinitis.     levETIRAcetam (KEPPRA) 500 MG tablet Take 3 tablets by mouth in the morning and at bedtime.     loperamide (IMODIUM) 2 MG capsule Take 2 mg by mouth as needed.     LORazepam (ATIVAN) 1 MG tablet Take 1 mg by mouth every 6 (six) hours as needed for seizure.      metoprolol succinate (TOPROL-XL) 25 MG 24 hr tablet Take 1 tablet (25 mg total) by mouth daily. 90 tablet 3   Midazolam (NAYZILAM) 5 MG/0.1ML SOLN Place 1 each into the nose as needed (seizure).     mirabegron ER (MYRBETRIQ) 50 MG TB24 tablet Take 1 tablet (50 mg total) by mouth daily. 90 tablet 1   nystatin ointment (MYCOSTATIN) Apply 1 Application topically 2 (two) times daily. 60 g 3   Propylene Glycol (SYSTANE BALANCE) 0.6 % SOLN Apply 1 drop to eye daily as needed (dry eyes).     pyridOXINE (VITAMIN B-6) 100 MG tablet Take 100 mg by mouth in the morning.     Tamsulosin HCl (FLOMAX) 0.4 MG CAPS Take 0.4 mg by mouth every evening.     thiamine 100 MG tablet Take 100 mg by mouth in the morning.     VIMPAT 200 MG TABS tablet Take 200 mg by mouth 2 (two) times daily.     vitamin B-12 (CYANOCOBALAMIN) 1000 MCG tablet Take 1,000 mcg by mouth in the morning.     nitroGLYCERIN (NITROSTAT) 0.4 MG SL tablet PLACE 1 TABLET (0.4 MG TOTAL) UNDER THE TONGUE  EVERY 5 (FIVE) MINUTES AS NEEDED FOR CHEST PAIN. 25 tablet 7   rosuvastatin (CRESTOR) 5 MG tablet TAKE 1 TABLET BY MOUTH THREE TIMES A WEEK AND INCREASE AS TOLERATED (Patient taking differently: Take 5 mg by mouth every Monday, Wednesday, and Friday. In the evening) 90 tablet 3   No current facility-administered medications for this visit.    Allergies:   Haloperidol, Rosuvastatin, Simvastatin, Lisinopril, Nifedipine, and Pravastatin   Social History: Social History   Socioeconomic History   Marital status: Married    Spouse name: Not on file   Number of children: 1  Years of education: Not on file   Highest education level: Not on file  Occupational History   Occupation: retired/ [employer]  Tobacco Use   Smoking status: Former    Packs/day: 0.00    Years: 30.00    Total pack years: 0.00    Types: Pipe, Cigarettes    Quit date: 07/04/2008    Years since quitting: 13.9   Smokeless tobacco: Former  Scientific laboratory technician Use: Never used  Substance and Sexual Activity   Alcohol use: Yes    Alcohol/week: 0.0 standard drinks of alcohol    Comment: Very Infrequently    Drug use: No   Sexual activity: Not Currently  Other Topics Concern   Not on file  Social History Narrative   Not on file   Social Determinants of Health   Financial Resource Strain: Low Risk  (05/30/2022)   Overall Financial Resource Strain (CARDIA)    Difficulty of Paying Living Expenses: Not hard at all  Food Insecurity: No Food Insecurity (05/30/2022)   Hunger Vital Sign    Worried About Running Out of Food in the Last Year: Never true    Ran Out of Food in the Last Year: Never true  Transportation Needs: No Transportation Needs (05/30/2022)   PRAPARE - Hydrologist (Medical): No    Lack of Transportation (Non-Medical): No  Physical Activity: Insufficiently Active (05/30/2022)   Exercise Vital Sign    Days of Exercise per Week: 3 days    Minutes of Exercise per Session: 30  min  Stress: No Stress Concern Present (05/30/2022)   [place]    Feeling of Stress : Only a little  Social Connections: Moderately Isolated (05/30/2022)   Social Connection and Isolation Panel [NHANES]    Frequency of Communication with Friends and Family: More than three times a week    Frequency of Social Gatherings with Friends and Family: More than three times a week    Attends Religious Services: Never    Marine scientist or Organizations: No    Attends Archivist Meetings: Never    Marital Status: Married  Human resources officer Violence: Not At Risk (05/30/2022)   Humiliation, Afraid, Rape, and Kick questionnaire    Fear of Current or Ex-Partner: No    Emotionally Abused: No    Physically Abused: No    Sexually Abused: No    Family History: Family History  Problem Relation Age of Onset   COPD Father    Coronary artery disease Father        MI in 53s   Hepatitis Mother    Diabetes Sister    Dementia Sister 18   Diabetes Brother    Colon cancer Neg Hx    Esophageal cancer Neg Hx    Rectal cancer Neg Hx    Stomach cancer Neg Hx    Colon polyps Neg Hx      Review of Systems: All other systems reviewed and are otherwise negative except as noted above.  Physical Exam: Vitals:   06/10/22 1101  BP: 120/70  Pulse: 60  SpO2: 97%  Weight: 245 lb (111.1 kg)  Height: 5' 8"  (1.727 m)     GEN- The patient is well appearing, alert and oriented x 3 today.   HEENT: normocephalic, atraumatic; sclera clear, conjunctiva pink; hearing intact; oropharynx clear; neck supple, no JVP Lymph- no cervical lymphadenopathy Lungs- Clear to ausculation bilaterally, normal work  of breathing.  No wheezes, rales, rhonchi Heart- Regular  rate and rhythm, no murmurs, rubs or gallops, PMI not laterally displaced GI- soft, non-tender, non-distended, bowel sounds present, no hepatosplenomegaly Extremities- no  clubbing or cyanosis. Trace peripheral edema; DP/PT/radial pulses 2+ bilaterally MS- no significant deformity or atrophy Skin- warm and dry, no rash or lesion; PPM pocket well healed Psych- euthymic mood, full affect Neuro- strength and sensation are intact  PPM Interrogation-  reviewed in detail today,  See PACEART report.  EKG:  EKG is not ordered today. EKG ordered 11/09/2021 showed AV dual pacing at 60 bpm   Recent Labs: 11/30/2021: TSH 2.240 03/21/2022: ALT 29; BUN 15; Creatinine, Ser 0.93; Hemoglobin 14.0; Platelets 172.0; Potassium 3.8; Sodium 138   Wt Readings from Last 3 Encounters:  06/10/22 245 lb (111.1 kg)  05/30/22 243 lb 3.2 oz (110.3 kg)  05/10/22 238 lb (108 kg)     Other studies Reviewed: Additional studies/ records that were reviewed today include: Previous EP office notes, Previous remote checks, Most recent labwork.   Assessment and Plan:  1. Symptomatic bradycardia s/p Medtronic PPM  Normal PPM function See Pace Art report No changes today  2. Chronic systolic CHF EF normalized to 55-60% s/p BIV upgrade  3. HTN Stable on current regimen   4. OSA  Encouraged nightly CPAP   Current medicines are reviewed at length with the patient today.    Disposition:   Follow up with Dr. Curt Bears in 6 months    Signed, Shirley Friar, PA-C  06/10/2022 11:05 AM  [facility] [address] [place] [address] [phone] (office) [phone] (fax)

## 2022-06-10 ENCOUNTER — Ambulatory Visit: Payer: Medicare Other | Attending: Student | Admitting: Student

## 2022-06-10 ENCOUNTER — Encounter: Payer: Self-pay | Admitting: Student

## 2022-06-10 VITALS — BP 120/70 | HR 60 | Ht 68.0 in | Wt 245.0 lb

## 2022-06-10 DIAGNOSIS — I10 Essential (primary) hypertension: Secondary | ICD-10-CM | POA: Insufficient documentation

## 2022-06-10 DIAGNOSIS — Z789 Other specified health status: Secondary | ICD-10-CM | POA: Diagnosis not present

## 2022-06-10 DIAGNOSIS — Z8679 Personal history of other diseases of the circulatory system: Secondary | ICD-10-CM | POA: Diagnosis not present

## 2022-06-10 DIAGNOSIS — I5042 Chronic combined systolic (congestive) and diastolic (congestive) heart failure: Secondary | ICD-10-CM | POA: Insufficient documentation

## 2022-06-10 DIAGNOSIS — I63512 Cerebral infarction due to unspecified occlusion or stenosis of left middle cerebral artery: Secondary | ICD-10-CM | POA: Diagnosis not present

## 2022-06-10 DIAGNOSIS — I493 Ventricular premature depolarization: Secondary | ICD-10-CM | POA: Insufficient documentation

## 2022-06-10 DIAGNOSIS — I428 Other cardiomyopathies: Secondary | ICD-10-CM | POA: Insufficient documentation

## 2022-06-10 DIAGNOSIS — G3183 Dementia with Lewy bodies: Secondary | ICD-10-CM | POA: Diagnosis not present

## 2022-06-10 DIAGNOSIS — I2510 Atherosclerotic heart disease of native coronary artery without angina pectoris: Secondary | ICD-10-CM

## 2022-06-10 DIAGNOSIS — Z8673 Personal history of transient ischemic attack (TIA), and cerebral infarction without residual deficits: Secondary | ICD-10-CM | POA: Diagnosis not present

## 2022-06-10 DIAGNOSIS — F02A2 Dementia in other diseases classified elsewhere, mild, with psychotic disturbance: Secondary | ICD-10-CM | POA: Diagnosis not present

## 2022-06-10 DIAGNOSIS — R413 Other amnesia: Secondary | ICD-10-CM | POA: Diagnosis not present

## 2022-06-10 DIAGNOSIS — R296 Repeated falls: Secondary | ICD-10-CM | POA: Diagnosis not present

## 2022-06-10 DIAGNOSIS — R41841 Cognitive communication deficit: Secondary | ICD-10-CM | POA: Diagnosis not present

## 2022-06-10 DIAGNOSIS — R279 Unspecified lack of coordination: Secondary | ICD-10-CM | POA: Diagnosis not present

## 2022-06-10 LAB — CUP PACEART INCLINIC DEVICE CHECK
Battery Remaining Longevity: 119 mo
Battery Voltage: 3.01 V
Brady Statistic AP VP Percent: 85.45 %
Brady Statistic AP VS Percent: 0.01 %
Brady Statistic AS VP Percent: 13.59 %
Brady Statistic AS VS Percent: 0.95 %
Brady Statistic RA Percent Paced: 85.84 %
Brady Statistic RV Percent Paced: 99.04 %
Date Time Interrogation Session: 20231208113046
Implantable Lead Connection Status: 753985
Implantable Lead Connection Status: 753985
Implantable Lead Connection Status: 753985
Implantable Lead Implant Date: 20191230
Implantable Lead Implant Date: 20191230
Implantable Lead Implant Date: 20220907
Implantable Lead Location: 753858
Implantable Lead Location: 753859
Implantable Lead Location: 753860
Implantable Lead Model: 4598
Implantable Lead Model: 5076
Implantable Lead Model: 5076
Implantable Pulse Generator Implant Date: 20220907
Lead Channel Impedance Value: 285 Ohm
Lead Channel Impedance Value: 361 Ohm
Lead Channel Impedance Value: 380 Ohm
Lead Channel Impedance Value: 380 Ohm
Lead Channel Impedance Value: 399 Ohm
Lead Channel Impedance Value: 399 Ohm
Lead Channel Impedance Value: 456 Ohm
Lead Channel Impedance Value: 475 Ohm
Lead Channel Impedance Value: 589 Ohm
Lead Channel Impedance Value: 627 Ohm
Lead Channel Impedance Value: 627 Ohm
Lead Channel Impedance Value: 722 Ohm
Lead Channel Impedance Value: 741 Ohm
Lead Channel Impedance Value: 760 Ohm
Lead Channel Pacing Threshold Amplitude: 0.75 V
Lead Channel Pacing Threshold Amplitude: 0.75 V
Lead Channel Pacing Threshold Amplitude: 1 V
Lead Channel Pacing Threshold Pulse Width: 0.4 ms
Lead Channel Pacing Threshold Pulse Width: 0.4 ms
Lead Channel Pacing Threshold Pulse Width: 0.4 ms
Lead Channel Sensing Intrinsic Amplitude: 1.5 mV
Lead Channel Sensing Intrinsic Amplitude: 1.625 mV
Lead Channel Sensing Intrinsic Amplitude: 20.625 mV
Lead Channel Sensing Intrinsic Amplitude: 20.625 mV
Lead Channel Setting Pacing Amplitude: 1.25 V
Lead Channel Setting Pacing Amplitude: 1.5 V
Lead Channel Setting Pacing Amplitude: 2.25 V
Lead Channel Setting Pacing Pulse Width: 0.4 ms
Lead Channel Setting Pacing Pulse Width: 0.4 ms
Lead Channel Setting Sensing Sensitivity: 1.2 mV
Zone Setting Status: 755011
Zone Setting Status: 755011

## 2022-06-10 NOTE — Patient Instructions (Addendum)
Medication Instructions:  Your physician recommends that you continue on your current medications as directed. Please refer to the Current Medication list given to you today.  *If you need a refill on your cardiac medications before your next appointment, please call your pharmacy*  Lab Work: None ordered.  If you have labs (blood work) drawn today and your tests are completely normal, you will receive your results only by: [place] (if you have MyChart) OR A paper copy in the mail If you have any lab test that is abnormal or we need to change your treatment, we will call you to review the results.  Testing/Procedures: None ordered.  Follow-Up: At [facility], you and your health needs are our priority.  As part of our continuing mission to provide you with exceptional heart care, we have created designated Provider Care Teams.  These Care Teams include your primary Cardiologist (physician) and Advanced Practice Providers (APPs -  Physician Assistants and Nurse Practitioners) who all work together to provide you with the care you need, when you need it.  We recommend signing up for the patient portal called "MyChart".  Sign up information is provided on this After Visit Summary.  MyChart is used to connect with patients for Virtual Visits (Telemedicine).  Patients are able to view lab/test results, encounter notes, upcoming appointments, etc.  Non-urgent messages can be sent to your provider as well.   To learn more about what you can do with MyChart, go to [email or web address].    Your next appointment:   Please schedule a follow up appointment for 6 months with Dr. Curt Bears.     The format for your next appointment:   In Person  Provider:   Legrand Como "Jonni Sanger" Chalmers Cater, PA-C  Remote monitoring is used to monitor your Pacemaker from home. This monitoring reduces the number of office visits required to check your device to one time per year. It allows [place] to keep an eye on the  functioning of your device to ensure it is working properly. You are scheduled for a device check from home on 08/26/22. You may send your transmission at any time that day. If you have a wireless device, the transmission will be sent automatically. After your physician reviews your transmission, you will receive a postcard with your next transmission date.  Important Information About [place]

## 2022-06-13 ENCOUNTER — Encounter: Payer: Medicare Other | Admitting: Physical Therapy

## 2022-06-13 ENCOUNTER — Other Ambulatory Visit: Payer: Self-pay

## 2022-06-13 ENCOUNTER — Telehealth: Payer: Self-pay | Admitting: Internal Medicine

## 2022-06-13 DIAGNOSIS — R300 Dysuria: Secondary | ICD-10-CM

## 2022-06-13 NOTE — Telephone Encounter (Signed)
Lab order placed.  Unable to leave message because voicemail was full.

## 2022-06-13 NOTE — Telephone Encounter (Signed)
Patient's wife called - she states that her husband has another UTI with symptoms - she would like for Dr. Quay Burow to order a lab test for a UTI Callers name:  [name] Phone#  [phone]  Please advise  Last visit: 05/10/2022

## 2022-06-13 NOTE — Telephone Encounter (Signed)
Wife returned call - I relayed message that lab orders have been placed for patient.

## 2022-06-14 ENCOUNTER — Other Ambulatory Visit (INDEPENDENT_AMBULATORY_CARE_PROVIDER_SITE_OTHER): Payer: Medicare Other

## 2022-06-14 DIAGNOSIS — R300 Dysuria: Secondary | ICD-10-CM | POA: Diagnosis not present

## 2022-06-15 LAB — URINALYSIS, ROUTINE W REFLEX MICROSCOPIC
Bilirubin Urine: NEGATIVE
Hgb urine dipstick: NEGATIVE
Ketones, ur: NEGATIVE
Leukocytes,Ua: NEGATIVE
Nitrite: NEGATIVE
RBC / HPF: NONE SEEN (ref 0–?)
Specific Gravity, Urine: 1.02 (ref 1.000–1.030)
Total Protein, Urine: NEGATIVE
Urine Glucose: NEGATIVE
Urobilinogen, UA: 0.2 (ref 0.0–1.0)
pH: 6 (ref 5.0–8.0)

## 2022-06-17 LAB — CULTURE, URINE COMPREHENSIVE: RESULT:: NO GROWTH

## 2022-06-19 DIAGNOSIS — G20C Parkinsonism, unspecified: Secondary | ICD-10-CM | POA: Insufficient documentation

## 2022-06-20 ENCOUNTER — Encounter: Payer: Medicare Other | Admitting: Physical Therapy

## 2022-06-30 NOTE — Progress Notes (Signed)
Remote pacemaker transmission.   

## 2022-07-05 DIAGNOSIS — F02A2 Dementia in other diseases classified elsewhere, mild, with psychotic disturbance: Secondary | ICD-10-CM | POA: Diagnosis not present

## 2022-07-05 DIAGNOSIS — R279 Unspecified lack of coordination: Secondary | ICD-10-CM | POA: Diagnosis not present

## 2022-07-05 DIAGNOSIS — Z8673 Personal history of transient ischemic attack (TIA), and cerebral infarction without residual deficits: Secondary | ICD-10-CM | POA: Diagnosis not present

## 2022-07-05 DIAGNOSIS — Z789 Other specified health status: Secondary | ICD-10-CM | POA: Diagnosis not present

## 2022-07-05 DIAGNOSIS — R419 Unspecified symptoms and signs involving cognitive functions and awareness: Secondary | ICD-10-CM | POA: Diagnosis not present

## 2022-07-05 DIAGNOSIS — I63512 Cerebral infarction due to unspecified occlusion or stenosis of left middle cerebral artery: Secondary | ICD-10-CM | POA: Diagnosis not present

## 2022-07-05 DIAGNOSIS — Z8679 Personal history of other diseases of the circulatory system: Secondary | ICD-10-CM | POA: Diagnosis not present

## 2022-07-05 DIAGNOSIS — R4189 Other symptoms and signs involving cognitive functions and awareness: Secondary | ICD-10-CM | POA: Diagnosis not present

## 2022-07-05 DIAGNOSIS — R413 Other amnesia: Secondary | ICD-10-CM | POA: Diagnosis not present

## 2022-07-05 DIAGNOSIS — R41841 Cognitive communication deficit: Secondary | ICD-10-CM | POA: Diagnosis not present

## 2022-07-05 DIAGNOSIS — R296 Repeated falls: Secondary | ICD-10-CM | POA: Diagnosis not present

## 2022-07-05 DIAGNOSIS — G3183 Dementia with Lewy bodies: Secondary | ICD-10-CM | POA: Diagnosis not present

## 2022-07-20 ENCOUNTER — Other Ambulatory Visit: Payer: Self-pay

## 2022-07-20 MED ORDER — NITROGLYCERIN 0.4 MG SL SUBL: SUBLINGUAL_TABLET | SUBLINGUAL | 11 refills | Status: DC | PRN

## 2022-07-21 DIAGNOSIS — N401 Enlarged prostate with lower urinary tract symptoms: Secondary | ICD-10-CM | POA: Diagnosis not present

## 2022-07-21 DIAGNOSIS — R310 Gross hematuria: Secondary | ICD-10-CM | POA: Diagnosis not present

## 2022-07-21 DIAGNOSIS — R350 Frequency of micturition: Secondary | ICD-10-CM | POA: Diagnosis not present

## 2022-07-21 DIAGNOSIS — R82998 Other abnormal findings in urine: Secondary | ICD-10-CM | POA: Diagnosis not present

## 2022-08-01 ENCOUNTER — Telehealth: Payer: Self-pay

## 2022-08-01 ENCOUNTER — Telehealth: Payer: Self-pay | Admitting: Physician Assistant

## 2022-08-01 DIAGNOSIS — I5042 Chronic combined systolic (congestive) and diastolic (congestive) heart failure: Secondary | ICD-10-CM

## 2022-08-01 MED ORDER — FUROSEMIDE 20 MG PO TABS: 20.0000 mg | ORAL_TABLET | Freq: Every day | ORAL | 2 refills | Status: DC

## 2022-08-01 NOTE — Telephone Encounter (Signed)
Spoke to the patients wife(Mary)who is listed on the designated party form, she had concerns with her husbands medications. According to [facility] the patient has prescriptions for Furosemide 40 mg and Torsemide 20 mg. The patient had an office visit on 12/8 the torsemide was discontinued at that time. Furosemide 20 mg is listed on his current medication list. Explained to the patient's wife to discontinue the torsemide and continue taking the furosemide as prescribed. Confirmed with the wife the correct pharmacy and sent the refill of furosemide. Patient's wife voiced understanding.

## 2022-08-01 NOTE — Telephone Encounter (Signed)
Pt c/o medication issue:  1. Name of Medication: Furosemide '40mg'$  Torsemide '20mg'$    2. How are you currently taking this medication (dosage and times per day)? Furosemide 1/2 tablet                Torsemide 1/2 tablet  3. Are you having a reaction (difficulty breathing--STAT)?   4. What is your medication issue? Patient's wife is calling to find out which one of these medication he is to be on.  If it's furosemide he will need a 30 day refill on it and would like it sent to [id number] [place] [place]

## 2022-08-04 DIAGNOSIS — M79672 Pain in left foot: Secondary | ICD-10-CM | POA: Diagnosis not present

## 2022-08-04 DIAGNOSIS — B351 Tinea unguium: Secondary | ICD-10-CM | POA: Diagnosis not present

## 2022-08-04 DIAGNOSIS — M79671 Pain in right foot: Secondary | ICD-10-CM | POA: Diagnosis not present

## 2022-08-16 DIAGNOSIS — R1310 Dysphagia, unspecified: Secondary | ICD-10-CM | POA: Diagnosis not present

## 2022-08-16 DIAGNOSIS — G20C Parkinsonism, unspecified: Secondary | ICD-10-CM | POA: Diagnosis not present

## 2022-08-16 DIAGNOSIS — G3183 Dementia with Lewy bodies: Secondary | ICD-10-CM | POA: Diagnosis not present

## 2022-08-16 DIAGNOSIS — R29898 Other symptoms and signs involving the musculoskeletal system: Secondary | ICD-10-CM | POA: Diagnosis not present

## 2022-08-16 DIAGNOSIS — F02B0 Dementia in other diseases classified elsewhere, moderate, without behavioral disturbance, psychotic disturbance, mood disturbance, and anxiety: Secondary | ICD-10-CM | POA: Diagnosis not present

## 2022-08-19 NOTE — Progress Notes (Unsigned)
Cardiology Office Note    Date:  08/22/2022   ID:  AARIK Weaver, DOB 14-Mar-1946, MRN [id number]  PCP:  [name], MD  Cardiologist:  Fransico Him, MD (primarily sees me) Electrophysiologist:  Will Meredith Leeds, MD   Chief Complaint: f/u CHF, edema  History of Present Illness:   Jeff Weaver is a 77 y.o. 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   He has a long history of chronic atypical chest pain with intermittent evaluations over the years. Last cath was in 01/12/18 showing minimal nonobstructive CAD, normal LVEF, normal LVEDP. He had 2nd degree AVB type 1 during cath prompting monitor 02/2018 which showed profound bradycardia, AVB with frequently nonconducted P waves, frequent PVCs (7%) and short runs of NSVT so underwent PPM implantation. Repeat echo 11/2020 demonstrated a decline in EF to 35%. Nuclear stress test 12/2020 was negative for ischemia. EP felt RV pacing was contributing to systolic dysfunction so underwent upgrade to CRT-P in 03/2021. Though he's done reasonably well from a cardiac standpoint since that time, he has had several complex medical admissions since then. He's had progressive cognitive decline ultimately felt to have Lewy body dementia and ground level fall (07/2021) 2/2 pre-syncope with right hip fracture, rib fracture, subdural hematoma, possible aspiration penumonitis, delirium, and complicated UTI. He underwent hip replacement. His SDH appeared to have been  managed conservatively. I also take care of his wife Jeff Weaver who previously relayed she was concerned during the hospitalization she felt as though the focus was on his dementia and less so on care of his other medical problems. Of note, she has previously requested not to specifically discuss dementia diagnosis with the patient as he would get upset and worked up about this. Repeat echo 11/30/21 showed normalization of LVEF to 55-60%, mild LVH, G1DD, normal RV, mildly elevated PASP, mild AI, mild dilation of aortic root, dilated IVC. He has not been on more aggressive doses of diuretic due to frequent urination, utilizing adult diaper. Amlodipine and losartan were previously discontinued due to hypotension and orthostasis.  He is seen for follow-up today with his wife Jeff Weaver. He reports the following issues: - increase in poking type chest pain left upper chest wall for the past 2 weeks. Similar descriptor as in prior visit, though seems to be more persistent upon waking this AM. Worse with palpation and coughing. Chronic cough continues. Breathing seems stable - increase in LE from prior, R>L. Legs feel heavy. Wife reports neurology also felt some of this sensation may be related to Parkinson disease, being started on carbidopa with plan for gradual dose increase - some dizziness upon standing - this was present in prior visits, requiring reduction in BP medicines. Initial BP here 108/66. We checked orthostatics (after movement to the lab and back to the table) with lying BP 135/74 pulse 60, sitting 137/81 pulse 59, standing 111/71 pulse 60, standing 119/72 pulse 59. No associated dizziness though shuffling gait - he also relates some concerns that are being addressed  by neurology with proprioception and drooling  Labwork independently reviewed: 03/2022 CBC Wnl, K 3.8, Cr 0.93, LFTs ok, A1c 5.9, LDL 65, trig 103 11/2021 TSH wnl  Past History   Past Medical History:  Diagnosis Date   Aortic  insufficiency    a. mild 2017 echo.   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 s/p clipping at [facility] in 8/13 c/b short-term memory loss, cerebral hemorrhage and seizure disorder s/p VP shunt.   Cerebral hemorrhage (HCC)    Chronic combined systolic and diastolic CHF (congestive heart failure) (HCC)    CVA (cerebral infarction)    Diverticulosis    Enteritis (regional)    Dr Olevia Perches   First degree AV block    GI bleed    HTN (hypertension)    Hyperlipidemia    Hypertensive heart disease    IBS (irritable bowel syndrome)    Lewy body dementia ([place])    Mild CAD    Cardiac cath 01/12/18 showed minimal nonobstructive CAD, normal LVEF, normal LVEDP.   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   Rheumatoid arthritis(714.0)    dxed in [employer] in 1980s   Second degree AV block, Mobitz type I    Seizures ([place])    last April 26,2021   Sinus bradycardia    Sleep apnea    SSS (sick sinus syndrome) (HCC)    Subdural hematoma (HCC)     Past Surgical History:  Procedure Laterality Date   BIV PACEMAKER INSERTION CRT-P N/A 03/10/2021   Procedure: BIV PACEMAKER INSERTION CRT-P UPGRADE;  Surgeon: Constance Haw, MD;  Location: [place] [facility];  Service: Cardiovascular;  Laterality: N/A;   CHOLECYSTECTOMY N/A 01/07/2016   Procedure: LAPAROSCOPIC CHOLECYSTECTOMY ;  Surgeon: Coralie Keens, MD;  Location: [place];  Service: General;  Laterality: N/A;   cns shunt  02/23/12   COLONOSCOPY W/ POLYPECTOMY  1978   negative 2009, Dr Olevia Perches. Due 2014   CRANIOTOMY  02/02/12   Dr Harvel Ricks, [place] of aneurysm   CRANIOTOMY  02-02-12   left pterional craniotomy for clipping complex anterior communicating artery aneurysm    HERNIA REPAIR     LEFT HEART CATH AND  CORONARY ANGIOGRAPHY N/A 01/12/2018   Procedure: LEFT HEART CATH AND CORONARY ANGIOGRAPHY;  Surgeon: Martinique, Peter M, MD;  Location: [place] [facility];  Service: Cardiovascular;  Laterality: N/A;   LEFT HEART CATHETERIZATION WITH CORONARY ANGIOGRAM N/A 11/26/2013   Procedure: LEFT HEART CATHETERIZATION WITH CORONARY ANGIOGRAM;  Surgeon: [name], MD;  Location: [facility] C[facility];  Service: Cardiovascular;  Laterality: N/A;   NOSE SURGERY     PACEMAKER IMPLANT N/A 07/02/2018   Procedure: PACEMAKER IMPLANT;  Surgeon: Constance Haw, MD;  Location: [place] [facility];  Service: Cardiovascular;  Laterality: N/A;   SHOULDER SURGERY  1997   TONSILLECTOMY AND ADENOIDECTOMY     VENTRICULOPERITONEAL SHUNT  02-23-12   INSERTION OF RIGHT FRONTAL VENTRICULOPERITONEAL SHUNT WITH A CODMAN HAKIM PROGRAMMABLE VALVE    Current Medications: Current Meds  Medication Sig   acetaminophen (TYLENOL) 325 MG tablet Take 325-650 mg by mouth every 6 (six) hours as needed (pain/headaches.).   carbidopa-levodopa (SINEMET IR) 25-100 MG tablet as directed.   cetirizine (ZYRTEC) 10 MG tablet Take  1 tablet (10 mg total) by mouth daily.   Cholecalciferol (VITAMIN D) 50 MCG (2000 UT) CAPS Take 2,000 Units by mouth in the morning.   clonazePAM (KLONOPIN) 1 MG disintegrating tablet Take 1 mg by mouth as directed. ALLOW 1 TABLET TO DISSOLVE IN CHEEK FOR SEIZURES LASTING LONGER THAN 4 MINUTES. OR AFTER 2ND SEIZURE IN 24 HOURS   donepezil (ARICEPT) 10 MG tablet Take half a tablet in the morning for a month and then increase to 1 full tablet daily.   furosemide (LASIX) 20 MG tablet Take 1 tablet by mouth daily.   furosemide (LASIX) 20 MG tablet Take 1 tablet (20 mg total) by mouth daily.   HYDROcodone-acetaminophen (NORCO) 5-325 MG tablet Take 1 tablet by mouth 3 (three) times daily as needed.   ipratropium (ATROVENT) 0.06 % nasal spray Place into the nose as needed for rhinitis.   levETIRAcetam (KEPPRA) 500 MG tablet Take 3  tablets by mouth in the morning and at bedtime.   loperamide (IMODIUM) 2 MG capsule Take 2 mg by mouth as needed.   LORazepam (ATIVAN) 1 MG tablet Take 1 mg by mouth every 6 (six) hours as needed for seizure.    metoprolol succinate (TOPROL-XL) 25 MG 24 hr tablet Take 1 tablet (25 mg total) by mouth daily.   Midazolam (NAYZILAM) 5 MG/0.1ML SOLN Place 1 each into the nose as needed (seizure).   mirabegron ER (MYRBETRIQ) 50 MG TB24 tablet Take 1 tablet (50 mg total) by mouth daily.   nitroGLYCERIN (NITROSTAT) 0.4 MG SL tablet PLACE 1 TABLET (0.4 MG TOTAL) UNDER THE TONGUE EVERY 5 (FIVE) MINUTES AS NEEDED FOR CHEST PAIN.   nystatin ointment (MYCOSTATIN) Apply 1 Application topically 2 (two) times daily.   Propylene Glycol (SYSTANE BALANCE) 0.6 % SOLN Apply 1 drop to eye daily as needed (dry eyes).   pyridOXINE (VITAMIN B-6) 100 MG tablet Take 100 mg by mouth in the morning.   rosuvastatin (CRESTOR) 5 MG tablet Take 5 mg by mouth 3 (three) times a week.   Tamsulosin HCl (FLOMAX) 0.4 MG CAPS Take 0.4 mg by mouth every evening.   thiamine 100 MG tablet Take 100 mg by mouth in the morning.   VIMPAT 200 MG TABS tablet Take 200 mg by mouth 2 (two) times daily.   vitamin B-12 (CYANOCOBALAMIN) 1000 MCG tablet Take 1,000 mcg by mouth in the morning.      Allergies:   Haloperidol, Rosuvastatin, Simvastatin, Lisinopril, Nifedipine, and Pravastatin   Social History   Socioeconomic History   Marital status: Married    Spouse name: Not on file   Number of children: 1   Years of education: Not on file   Highest education level: Not on file  Occupational History   Occupation: retired/ Education officer, community  Tobacco Use   Smoking status: Former    Packs/day: 0.00    Years: 30.00    Total pack years: 0.00    Types: Pipe, Cigarettes    Quit date: 07/04/2008    Years since quitting: 14.1   Smokeless tobacco: Former  Scientific laboratory technician Use: Never used  Substance and Sexual Activity   Alcohol use: Yes     Alcohol/week: 0.0 standard drinks of alcohol    Comment: Very Infrequently    Drug use: No   Sexual activity: Not Currently  Other Topics Concern   Not on file  Social History Narrative   Not on file   Social Determinants of Health   Financial Resource Strain:  Low Risk  (05/30/2022)   Overall Financial Resource Strain (CARDIA)    Difficulty of Paying Living Expenses: Not hard at all  Food Insecurity: No Food Insecurity (05/30/2022)   Hunger Vital Sign    Worried About Running Out of Food in the Last Year: Never true    Ran Out of Food in the Last Year: Never true  Transportation Needs: No Transportation Needs (05/30/2022)   PRAPARE - Hydrologist (Medical): No    Lack of Transportation (Non-Medical): No  Physical Activity: Insufficiently Active (05/30/2022)   Exercise Vital Sign    Days of Exercise per Week: 3 days    Minutes of Exercise per Session: 30 min  Stress: No Stress Concern Present (05/30/2022)   [place]    Feeling of Stress : Only a little  Social Connections: Moderately Isolated (05/30/2022)   Social Connection and Isolation Panel [NHANES]    Frequency of Communication with Friends and Family: More than three times a week    Frequency of Social Gatherings with Friends and Family: More than three times a week    Attends Religious Services: Never    Marine scientist or Organizations: No    Attends Music therapist: Never    Marital Status: Married     Family History:  The patient's family history includes COPD in his father; Coronary artery disease in his father; Dementia (age of onset: 73) in his sister; Diabetes in his brother and sister; Hepatitis in his mother. There is no history of Colon cancer, Esophageal cancer, Rectal cancer, Stomach cancer, or Colon polyps.  ROS:   Please see the history of present illness.  All other systems are reviewed  and otherwise negative.    EKG(s)/Additional Testing   EKG:  EKG is ordered today, personally reviewed, demonstrating AV paced rhythm, nonspecific TW changes. No major change from prior.   CV Studies: Cardiac studies reviewed are outlined and summarized above. Otherwise please see EMR for full report.  Recent Labs: 11/30/2021: TSH 2.240 03/21/2022: ALT 29; BUN 15; Creatinine, Ser 0.93; Hemoglobin 14.0; Platelets 172.0; Potassium 3.8; Sodium 138  Recent Lipid Panel    Component Value Date/Time   CHOL 135 03/21/2022 1249   CHOL 139 11/30/2021 0853   CHOL 159 03/27/2013 0818   TRIG 103.0 03/21/2022 1249   TRIG 127 03/27/2013 0818   HDL 49.50 03/21/2022 1249   HDL 52 11/30/2021 0853   HDL 45 03/27/2013 0818   CHOLHDL 3 03/21/2022 1249   VLDL 20.6 03/21/2022 1249   LDLCALC 65 03/21/2022 1249   LDLCALC 71 11/30/2021 0853   LDLCALC 89 03/27/2013 0818   LDLDIRECT 87.6 09/19/2012 1206    PHYSICAL EXAM:    VS:  BP 108/66   Pulse 60   Ht 5' 8"$  (1.727 m)   Wt 250 lb (113.4 kg)   SpO2 95%   BMI 38.01 kg/m   BMI: Body mass index is 38.01 kg/m.  GEN: Well nourished, well developed male in no acute distress HEENT: normocephalic, atraumatic Neck: no JVD, carotid bruits, or masses Cardiac: RRR; no murmurs, rubs, or gallops, no edema  Respiratory:  clear to auscultation bilaterally, normal work of breathing GI: soft, nontender, nondistended, + BS MS: no deformity or atrophy Skin: warm and dry, no rash Neuro:  Alert and Oriented x 3, Strength and sensation are intact, follows commands Psych: euthymic mood, full affect  Wt Readings from Last 3 Encounters:  08/22/22 250 lb (113.4 kg)  06/10/22 245 lb (111.1 kg)  05/30/22 243 lb 3.2 oz (110.3 kg)     ASSESSMENT & PLAN:   1. Chronic combined CHF/presumed NICM, here with lower extremity edema - LE edema seems slightly worse than prior visit.  I suspect this is predominantly dependent from positional gravity and inactivity.  Unfortunately he is unlikely to tolerate higher doses of diuretic at baseline due to orthostasis, incontinence and shuffling gait. I previously suggested to try switching furosemide to torsemide. They have not enacted this change yet but do have the rx filled. We'll have him take torsemide 24m daily x 3 days then reduce to 1/2 tablet daily thereafter. Compression hose encouraged. We'll also move up echo to ensure no recurrent decline in LVEF. Check labs today including d-dimer. If d-dimer is elevated would suggest arranging LE venous duplex tomorrow. I do not think he would tolerate sitting in the emergency room all night.  2. Essential HTN with orthostasis - Jamie and I discussed that my concern is fall risk with his orthostasis which I think might be related to his neurologic disorder. He's had to stop amlodipine and losartan in recent years. We'll change metoprolol (which he's on for frequent PVCs) to 12.539mnightly and add midodrine 2.58m27mID, with goal BP <15123456stolic. They were advised to notify if regular SBP running >150 at home.  3. Minimal CAD, atypical chest pain - though [place] this is new, this was described similarly in the past during prior visits. Given duration of pain, if this were cardiac, would expect to see troponin leak - we'll check today. They understand if positive, would need to proceed to ER for evaluation, but if negative, recommend f/u PCP. He continues with rosuvastatin 3x/week, LDL at goal in 03/2022. Not on ASA due to SDH.   4. H/o PVCs - see above regarding metoprolol.  5. Mild dilation of aortic root - was due for echo in 11/2022, we will move up given increase in edema.  Disposition: F/u with me in 4-6 weeks. Otherwise EP APP previously advised f/u Dr. CamCurt Bearsn 12/2022.   Medication Adjustments/Labs and Tests Ordered: Current medicines are reviewed at length with the patient today.  Concerns regarding medicines are outlined above. Medication changes, Labs and  Tests ordered today are summarized above and listed in the Patient Instructions accessible in Encounters.   Signed, DayCharlie PitterA-C  08/22/2022 2:26 PM    [place]: [phone]: [phone]

## 2022-08-22 ENCOUNTER — Encounter: Payer: Self-pay | Admitting: Physician Assistant

## 2022-08-22 ENCOUNTER — Ambulatory Visit: Payer: Medicare Other | Attending: Physician Assistant | Admitting: Physician Assistant

## 2022-08-22 VITALS — BP 108/66 | HR 60 | Ht 68.0 in | Wt 250.0 lb

## 2022-08-22 DIAGNOSIS — I493 Ventricular premature depolarization: Secondary | ICD-10-CM | POA: Diagnosis not present

## 2022-08-22 DIAGNOSIS — I10 Essential (primary) hypertension: Secondary | ICD-10-CM

## 2022-08-22 DIAGNOSIS — R600 Localized edema: Secondary | ICD-10-CM | POA: Diagnosis not present

## 2022-08-22 DIAGNOSIS — I5042 Chronic combined systolic (congestive) and diastolic (congestive) heart failure: Secondary | ICD-10-CM

## 2022-08-22 DIAGNOSIS — I77810 Thoracic aortic ectasia: Secondary | ICD-10-CM

## 2022-08-22 DIAGNOSIS — I2510 Atherosclerotic heart disease of native coronary artery without angina pectoris: Secondary | ICD-10-CM

## 2022-08-22 DIAGNOSIS — I951 Orthostatic hypotension: Secondary | ICD-10-CM

## 2022-08-22 DIAGNOSIS — R0789 Other chest pain: Secondary | ICD-10-CM | POA: Diagnosis not present

## 2022-08-22 DIAGNOSIS — I428 Other cardiomyopathies: Secondary | ICD-10-CM

## 2022-08-22 LAB — COMPREHENSIVE METABOLIC PANEL
ALT: 26 IU/L (ref 0–44)
AST: 21 IU/L (ref 0–40)
Albumin/Globulin Ratio: 2 (ref 1.2–2.2)
Albumin: 4.4 g/dL (ref 3.8–4.8)
Alkaline Phosphatase: 60 IU/L (ref 44–121)
BUN/Creatinine Ratio: 20 (ref 10–24)
BUN: 20 mg/dL (ref 8–27)
Bilirubin Total: 0.3 mg/dL (ref 0.0–1.2)
CO2: 28 mmol/L (ref 20–29)
Calcium: 10.1 mg/dL (ref 8.6–10.2)
Chloride: 100 mmol/L (ref 96–106)
Creatinine, Ser: 1.02 mg/dL (ref 0.76–1.27)
Globulin, Total: 2.2 g/dL (ref 1.5–4.5)
Glucose: 101 mg/dL — ABNORMAL HIGH (ref 70–99)
Potassium: 4 mmol/L (ref 3.5–5.2)
Sodium: 138 mmol/L (ref 134–144)
Total Protein: 6.6 g/dL (ref 6.0–8.5)
eGFR: 75 mL/min/{1.73_m2} (ref >59)

## 2022-08-22 LAB — TROPONIN T: Troponin T (Highly Sensitive): 17 ng/L (ref 0–22)

## 2022-08-22 LAB — CBC
Hematocrit: 43.3 % (ref 37.5–51.0)
Hemoglobin: 15 g/dL (ref 13.0–17.7)
MCH: 31.6 pg (ref 26.6–33.0)
MCHC: 34.6 g/dL (ref 31.5–35.7)
MCV: 91 fL (ref 79–97)
Platelets: 173 10*3/uL (ref 150–450)
RBC: 4.75 x10E6/uL (ref 4.14–5.80)
RDW: 14 % (ref 11.6–15.4)
WBC: 7.9 10*3/uL (ref 3.4–10.8)

## 2022-08-22 LAB — D-DIMER, QUANTITATIVE: D-DIMER: 0.67 mg/L FEU — ABNORMAL HIGH (ref 0.00–0.49)

## 2022-08-22 MED ORDER — MIDODRINE HCL 2.5 MG PO TABS: 2.5000 mg | ORAL_TABLET | Freq: Three times a day (TID) | ORAL | 3 refills | Status: DC

## 2022-08-22 MED ORDER — METOPROLOL SUCCINATE ER 25 MG PO TB24: 12.5000 mg | ORAL_TABLET | Freq: Every day | ORAL | 3 refills | Status: DC

## 2022-08-22 MED ORDER — TORSEMIDE 20 MG PO TABS: 10.0000 mg | ORAL_TABLET | Freq: Two times a day (BID) | ORAL | 3 refills | Status: DC

## 2022-08-22 NOTE — Patient Instructions (Addendum)
Medication Instructions:  Your physician has recommended you make the following change in your medication:   STOP Lasix  START Torsemide 20 mg taking 1 tablet for 3 days then reduce to 1/2 tablet daily  REDUCE the Metoprolol to 1/2 tablet at bedtime   START Midodrine 2.5 taking 1 three times a day  *If you need a refill on your cardiac medications before your next appointment, please call your pharmacy*   Lab Work: TODAY:  STAT TROPONIN, CMET, CBC, TSH, & DDIMER  If you have labs (blood work) drawn today and your tests are completely normal, you will receive your results only by: [place] (if you have MyChart) OR A paper copy in the mail If you have any lab test that is abnormal or we need to change your treatment, we will call you to review the results.   Testing/Procedures: Your physician has requested that you have an echocardiogram (order already in just need to move it up to 4 weeks). Echocardiography is a painless test that uses sound waves to create images of your heart. It provides your doctor with information about the size and shape of your heart and how well your heart's chambers and valves are working. This procedure takes approximately one hour. There are no restrictions for this procedure. Please do NOT wear cologne, perfume, aftershave, or lotions (deodorant is allowed). Please arrive 15 minutes prior to your appointment time.    Follow-Up: At [facility], you and your health needs are our priority.  As part of our continuing mission to provide you with exceptional heart care, we have created designated Provider Care Teams.  These Care Teams include your primary Cardiologist (physician) and Advanced Practice Providers (APPs -  Physician Assistants and Nurse Practitioners) who all work together to provide you with the care you need, when you need it.  We recommend signing up for the patient portal called "MyChart".  Sign up information is provided on this  After Visit Summary.  MyChart is used to connect with patients for Virtual Visits (Telemedicine).  Patients are able to view lab/test results, encounter notes, upcoming appointments, etc.  Non-urgent messages can be sent to your provider as well.   To learn more about what you can do with MyChart, go to [email or web address].    Your next appointment:   1 month(s)  Provider:   [name], PA-C         Other Instructions

## 2022-08-23 LAB — TSH: TSH: 1.82 u[IU]/mL (ref 0.450–4.500)

## 2022-08-24 DIAGNOSIS — N323 Diverticulum of bladder: Secondary | ICD-10-CM | POA: Diagnosis not present

## 2022-08-24 DIAGNOSIS — N3289 Other specified disorders of bladder: Secondary | ICD-10-CM | POA: Diagnosis not present

## 2022-08-24 DIAGNOSIS — R310 Gross hematuria: Secondary | ICD-10-CM | POA: Diagnosis not present

## 2022-08-24 DIAGNOSIS — N400 Enlarged prostate without lower urinary tract symptoms: Secondary | ICD-10-CM | POA: Diagnosis not present

## 2022-08-26 ENCOUNTER — Ambulatory Visit: Payer: Medicare Other

## 2022-08-26 DIAGNOSIS — I428 Other cardiomyopathies: Secondary | ICD-10-CM | POA: Diagnosis not present

## 2022-08-26 LAB — CUP PACEART REMOTE DEVICE CHECK
Battery Remaining Longevity: 117 mo
Battery Voltage: 3 V
Brady Statistic AP VP Percent: 85.29 %
Brady Statistic AP VS Percent: 0.01 %
Brady Statistic AS VP Percent: 14.26 %
Brady Statistic AS VS Percent: 0.44 %
Brady Statistic RA Percent Paced: 85.43 %
Brady Statistic RV Percent Paced: 99.55 %
Date Time Interrogation Session: 20240223074340
Implantable Lead Connection Status: 753985
Implantable Lead Connection Status: 753985
Implantable Lead Connection Status: 753985
Implantable Lead Implant Date: 20191230
Implantable Lead Implant Date: 20191230
Implantable Lead Implant Date: 20220907
Implantable Lead Location: 753858
Implantable Lead Location: 753859
Implantable Lead Location: 753860
Implantable Lead Model: 4598
Implantable Lead Model: 5076
Implantable Lead Model: 5076
Implantable Pulse Generator Implant Date: 20220907
Lead Channel Impedance Value: 304 Ohm
Lead Channel Impedance Value: 380 Ohm
Lead Channel Impedance Value: 399 Ohm
Lead Channel Impedance Value: 418 Ohm
Lead Channel Impedance Value: 418 Ohm
Lead Channel Impedance Value: 437 Ohm
Lead Channel Impedance Value: 475 Ohm
Lead Channel Impedance Value: 513 Ohm
Lead Channel Impedance Value: 589 Ohm
Lead Channel Impedance Value: 684 Ohm
Lead Channel Impedance Value: 684 Ohm
Lead Channel Impedance Value: 760 Ohm
Lead Channel Impedance Value: 779 Ohm
Lead Channel Impedance Value: 798 Ohm
Lead Channel Pacing Threshold Amplitude: 0.75 V
Lead Channel Pacing Threshold Amplitude: 0.75 V
Lead Channel Pacing Threshold Amplitude: 1.125 V
Lead Channel Pacing Threshold Pulse Width: 0.4 ms
Lead Channel Pacing Threshold Pulse Width: 0.4 ms
Lead Channel Pacing Threshold Pulse Width: 0.4 ms
Lead Channel Sensing Intrinsic Amplitude: 1.875 mV
Lead Channel Sensing Intrinsic Amplitude: 1.875 mV
Lead Channel Sensing Intrinsic Amplitude: 20.625 mV
Lead Channel Sensing Intrinsic Amplitude: 20.625 mV
Lead Channel Setting Pacing Amplitude: 1.25 V
Lead Channel Setting Pacing Amplitude: 1.5 V
Lead Channel Setting Pacing Amplitude: 2.25 V
Lead Channel Setting Pacing Pulse Width: 0.4 ms
Lead Channel Setting Pacing Pulse Width: 0.4 ms
Lead Channel Setting Sensing Sensitivity: 1.2 mV
Zone Setting Status: 755011
Zone Setting Status: 755011

## 2022-09-18 ENCOUNTER — Encounter: Payer: Self-pay | Admitting: Internal Medicine

## 2022-09-18 NOTE — Progress Notes (Signed)
Subjective:    Patient ID: Jeff Weaver, male    DOB: 10-30-1945, 77 y.o.   MRN: [id number]     HPI Jeff Weaver is here for follow up of his chronic medical problems.   Started on sinemet for parkinsonism.   Will do botox for sialorrhea Head drop - related to parkinsonism  Could just do A1c  ? Do reclast at [facility]    Medications and allergies reviewed with patient and updated if appropriate.  Current Outpatient Medications on File Prior to Visit  Medication Sig Dispense Refill   acetaminophen (TYLENOL) 325 MG tablet Take 325-650 mg by mouth every 6 (six) hours as needed (pain/headaches.).     carbidopa-levodopa (SINEMET IR) 25-100 MG tablet as directed.     cetirizine (ZYRTEC) 10 MG tablet Take 1 tablet (10 mg total) by mouth daily. 30 tablet 11   Cholecalciferol (VITAMIN D) 50 MCG (2000 UT) CAPS Take 2,000 Units by mouth in the morning.     clonazePAM (KLONOPIN) 1 MG disintegrating tablet Take 1 mg by mouth as directed. ALLOW 1 TABLET TO DISSOLVE IN CHEEK FOR SEIZURES LASTING LONGER THAN 4 MINUTES. OR AFTER 2ND SEIZURE IN 24 HOURS     donepezil (ARICEPT) 10 MG tablet Take half a tablet in the morning for a month and then increase to 1 full tablet daily. 30 tablet 5   HYDROcodone-acetaminophen (NORCO) 5-325 MG tablet Take 1 tablet by mouth 3 (three) times daily as needed. 20 tablet 0   ipratropium (ATROVENT) 0.06 % nasal spray Place into the nose as needed for rhinitis.     levETIRAcetam (KEPPRA) 500 MG tablet Take 3 tablets by mouth in the morning and at bedtime.     loperamide (IMODIUM) 2 MG capsule Take 2 mg by mouth as needed.     LORazepam (ATIVAN) 1 MG tablet Take 1 mg by mouth every 6 (six) hours as needed for seizure.      metoprolol succinate (TOPROL XL) 25 MG 24 hr tablet Take 0.5 tablets (12.5 mg total) by mouth daily. 45 tablet 3   Midazolam (NAYZILAM) 5 MG/0.1ML SOLN Place 1 each into the nose as needed (seizure).     midodrine (PROAMATINE) 2.5 MG tablet Take 1 tablet  (2.5 mg total) by mouth 3 (three) times daily with meals. 270 tablet 3   mirabegron ER (MYRBETRIQ) 50 MG TB24 tablet Take 1 tablet (50 mg total) by mouth daily. 90 tablet 1   nitroGLYCERIN (NITROSTAT) 0.4 MG SL tablet PLACE 1 TABLET (0.4 MG TOTAL) UNDER THE TONGUE EVERY 5 (FIVE) MINUTES AS NEEDED FOR CHEST PAIN. 25 tablet 11   nystatin ointment (MYCOSTATIN) Apply 1 Application topically 2 (two) times daily. 60 g 3   Propylene Glycol (SYSTANE BALANCE) 0.6 % SOLN Apply 1 drop to eye daily as needed (dry eyes).     pyridOXINE (VITAMIN B-6) 100 MG tablet Take 100 mg by mouth in the morning.     rosuvastatin (CRESTOR) 5 MG tablet Take 5 mg by mouth 3 (three) times a Weaver.     Tamsulosin HCl (FLOMAX) 0.4 MG CAPS Take 0.4 mg by mouth every evening.     thiamine 100 MG tablet Take 100 mg by mouth in the morning.     torsemide (DEMADEX) 20 MG tablet Take 0.5 tablets (10 mg total) by mouth 2 (two) times daily. 45 tablet 3   VIMPAT 200 MG TABS tablet Take 200 mg by mouth 2 (two) times daily.  vitamin B-12 (CYANOCOBALAMIN) 1000 MCG tablet Take 1,000 mcg by mouth in the morning.     No current facility-administered medications on file prior to visit.     Review of Systems     Objective:  There were no vitals filed for this visit. BP Readings from Last 3 Encounters:  08/22/22 108/66  06/10/22 120/70  05/30/22 124/60   Wt Readings from Last 3 Encounters:  08/22/22 250 lb (113.4 kg)  06/10/22 245 lb (111.1 kg)  05/30/22 243 lb 3.2 oz (110.3 kg)   There is no height or weight on file to calculate BMI.    Physical Exam     Lab Results  Component Value Date   WBC 7.9 08/22/2022   HGB 15.0 08/22/2022   HCT 43.3 08/22/2022   PLT 173 08/22/2022   GLUCOSE 101 (H) 08/22/2022   CHOL 135 03/21/2022   TRIG 103.0 03/21/2022   HDL 49.50 03/21/2022   LDLDIRECT 87.6 09/19/2012   LDLCALC 65 03/21/2022   ALT 26 08/22/2022   AST 21 08/22/2022   NA 138 08/22/2022   K 4.0 08/22/2022   CL 100  08/22/2022   CREATININE 1.02 08/22/2022   BUN 20 08/22/2022   CO2 28 08/22/2022   TSH 1.820 08/22/2022   INR 1.0 07/17/2017   HGBA1C 5.9 03/21/2022   MICROALBUR 1.0 11/13/2014     Assessment & Plan:    See Problem List for Assessment and Plan of chronic medical problems.

## 2022-09-18 NOTE — Patient Instructions (Signed)
      Blood work was ordered.   The lab is on the first floor.    Medications changes include :       A referral was ordered for XXX.     Someone will call you to schedule an appointment.    No follow-ups on file.  

## 2022-09-19 ENCOUNTER — Ambulatory Visit (INDEPENDENT_AMBULATORY_CARE_PROVIDER_SITE_OTHER): Payer: Medicare Other | Admitting: Internal Medicine

## 2022-09-19 VITALS — BP 106/72 | HR 75 | Temp 98.0°F | Ht 68.0 in | Wt 244.0 lb

## 2022-09-19 DIAGNOSIS — G20A1 Parkinson's disease without dyskinesia, without mention of fluctuations: Secondary | ICD-10-CM

## 2022-09-19 DIAGNOSIS — E559 Vitamin D deficiency, unspecified: Secondary | ICD-10-CM | POA: Diagnosis not present

## 2022-09-19 DIAGNOSIS — R7303 Prediabetes: Secondary | ICD-10-CM

## 2022-09-19 DIAGNOSIS — I493 Ventricular premature depolarization: Secondary | ICD-10-CM | POA: Diagnosis not present

## 2022-09-19 DIAGNOSIS — G40909 Epilepsy, unspecified, not intractable, without status epilepticus: Secondary | ICD-10-CM | POA: Diagnosis not present

## 2022-09-19 DIAGNOSIS — I10 Essential (primary) hypertension: Secondary | ICD-10-CM

## 2022-09-19 DIAGNOSIS — E7800 Pure hypercholesterolemia, unspecified: Secondary | ICD-10-CM

## 2022-09-19 DIAGNOSIS — G3183 Dementia with Lewy bodies: Secondary | ICD-10-CM | POA: Diagnosis not present

## 2022-09-19 DIAGNOSIS — B356 Tinea cruris: Secondary | ICD-10-CM | POA: Diagnosis not present

## 2022-09-19 DIAGNOSIS — E538 Deficiency of other specified B group vitamins: Secondary | ICD-10-CM

## 2022-09-19 DIAGNOSIS — F0280 Dementia in other diseases classified elsewhere without behavioral disturbance: Secondary | ICD-10-CM

## 2022-09-19 DIAGNOSIS — R350 Frequency of micturition: Secondary | ICD-10-CM

## 2022-09-19 DIAGNOSIS — H532 Diplopia: Secondary | ICD-10-CM

## 2022-09-19 DIAGNOSIS — I5020 Unspecified systolic (congestive) heart failure: Secondary | ICD-10-CM

## 2022-09-19 LAB — URINALYSIS, ROUTINE W REFLEX MICROSCOPIC
Bilirubin Urine: NEGATIVE
Ketones, ur: NEGATIVE
Nitrite: NEGATIVE
Specific Gravity, Urine: 1.025 (ref 1.000–1.030)
Total Protein, Urine: NEGATIVE
Urine Glucose: NEGATIVE
Urobilinogen, UA: 0.2 (ref 0.0–1.0)
pH: 6 (ref 5.0–8.0)

## 2022-09-19 NOTE — Assessment & Plan Note (Signed)
Chronic This has been ongoing Sees neurosurgery, neurology and ophthalmology Was told that he needs to see neuro-ophthalmology-needs prism in his glasses in order to help with the diplopia He is very discouraged that he is not able to see well or read

## 2022-09-19 NOTE — Assessment & Plan Note (Addendum)
Chronic Currently controlled Typically wears a diaper and does not always change them as regularly as he should Continue Monistat ointment as needed

## 2022-09-19 NOTE — Assessment & Plan Note (Signed)
Chronic Continue B12 supplementation 

## 2022-09-19 NOTE — Assessment & Plan Note (Addendum)
Chronic Following with cardiology Has chronic leg edema-likely dependent Has chronic shortness of breath-difficult to evaluate because he is sedentary and deconditioned On torsemide 5 mg daily Leg swelling is bothersome-advised to try 5 mg alternating with 10 mg His wife is thinking about getting him an electric chair which would provide better leg support and he might be more likely to elevate his legs during the day, which I advised she do

## 2022-09-19 NOTE — Assessment & Plan Note (Addendum)
Chronic Low sugar / carb diet Stressed regular exercise is much as tolerated

## 2022-09-19 NOTE — Assessment & Plan Note (Signed)
Chronic Management per cardiology On metoprolol 12.5 mg nightly Also on midodrine 2.5 mg 3 times daily for orthostasis Goal for cardio is SBP less than 150

## 2022-09-19 NOTE — Assessment & Plan Note (Signed)
Chronic Continue vitamin D supplementation 

## 2022-09-19 NOTE — Assessment & Plan Note (Signed)
Chronic Always concerned about possible UTI since it is difficult to tell with his symptoms Check UA, urine culture

## 2022-09-19 NOTE — Assessment & Plan Note (Signed)
Chronic Following with neurology On donepezil 10 mg daily

## 2022-09-19 NOTE — Assessment & Plan Note (Addendum)
Chronic Following with neurology Seizures well-controlled On Keppra 1500 mg twice daily, Vimpat 200 mg twice daily Has Ativan 1 mg every 6 hours as needed for seizure or clonazepam 1 mg for seizure lasting more than 4 minutes or after second seizure in 24 hours, midazolam nasal spray as needed for seizure

## 2022-09-19 NOTE — Assessment & Plan Note (Addendum)
Chronic Following with neurology Has not yet started Sinemet IR 25-100 mg-encouraged him to try this to see how much it helps with his ambulation

## 2022-09-19 NOTE — Progress Notes (Signed)
Remote pacemaker transmission.   

## 2022-09-19 NOTE — Assessment & Plan Note (Addendum)
Chronic Management per cardiology On metoprolol 12.5 mg nightly

## 2022-09-20 LAB — URINE CULTURE

## 2022-09-21 ENCOUNTER — Ambulatory Visit (HOSPITAL_COMMUNITY): Payer: Medicare Other | Attending: Physician Assistant

## 2022-09-21 DIAGNOSIS — I2510 Atherosclerotic heart disease of native coronary artery without angina pectoris: Secondary | ICD-10-CM | POA: Diagnosis not present

## 2022-09-21 DIAGNOSIS — I10 Essential (primary) hypertension: Secondary | ICD-10-CM | POA: Diagnosis not present

## 2022-09-21 DIAGNOSIS — I77810 Thoracic aortic ectasia: Secondary | ICD-10-CM | POA: Diagnosis not present

## 2022-09-21 DIAGNOSIS — R0789 Other chest pain: Secondary | ICD-10-CM

## 2022-09-21 DIAGNOSIS — I428 Other cardiomyopathies: Secondary | ICD-10-CM

## 2022-09-21 DIAGNOSIS — I5042 Chronic combined systolic (congestive) and diastolic (congestive) heart failure: Secondary | ICD-10-CM | POA: Diagnosis not present

## 2022-09-21 DIAGNOSIS — R600 Localized edema: Secondary | ICD-10-CM

## 2022-09-21 DIAGNOSIS — I493 Ventricular premature depolarization: Secondary | ICD-10-CM | POA: Diagnosis not present

## 2022-09-21 LAB — ECHOCARDIOGRAM COMPLETE
Area-P 1/2: 3.21 cm2
P 1/2 time: 399 msec
S' Lateral: 3.4 cm

## 2022-09-29 ENCOUNTER — Other Ambulatory Visit: Payer: Self-pay | Admitting: Internal Medicine

## 2022-10-05 DIAGNOSIS — R633 Feeding difficulties, unspecified: Secondary | ICD-10-CM | POA: Diagnosis not present

## 2022-10-05 DIAGNOSIS — R1312 Dysphagia, oropharyngeal phase: Secondary | ICD-10-CM | POA: Diagnosis not present

## 2022-10-05 DIAGNOSIS — R1310 Dysphagia, unspecified: Secondary | ICD-10-CM | POA: Diagnosis not present

## 2022-10-06 DIAGNOSIS — M79672 Pain in left foot: Secondary | ICD-10-CM | POA: Diagnosis not present

## 2022-10-06 DIAGNOSIS — B351 Tinea unguium: Secondary | ICD-10-CM | POA: Diagnosis not present

## 2022-10-06 DIAGNOSIS — M79671 Pain in right foot: Secondary | ICD-10-CM | POA: Diagnosis not present

## 2022-10-13 DIAGNOSIS — R569 Unspecified convulsions: Secondary | ICD-10-CM | POA: Diagnosis not present

## 2022-10-29 ENCOUNTER — Other Ambulatory Visit: Payer: Self-pay | Admitting: Student

## 2022-10-29 DIAGNOSIS — I5042 Chronic combined systolic (congestive) and diastolic (congestive) heart failure: Secondary | ICD-10-CM

## 2022-10-31 ENCOUNTER — Encounter: Payer: Self-pay | Admitting: Physician Assistant

## 2022-10-31 NOTE — Progress Notes (Unsigned)
Cardiology Office Note    Date:  11/01/2022   ID:  Kollin, Udell [birth year], MRN [id number]  PCP:  Pincus Sanes, MD  Cardiologist:  Armanda Magic, MD  Electrophysiologist:  Regan Lemming, MD   Chief Complaint: f/u CHF, orthostasis  History of Present Illness:   JEURY MCNAB is a 77 y.o. 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   He has a long history of chronic atypical chest pain with intermittent evaluations over the years. Last cath was in 2019 showing minimal nonobstructive CAD, normal LVEF, normal LVEDP. He had 2nd degree AVB type 1 during cath prompting monitor 02/2018 which showed profound bradycardia, AVB with frequently nonconducted P waves, frequent PVCs (7%) and short runs of NSVT so underwent PPM implantation. Repeat echo 11/2020 demonstrated a decline in EF to 35%. Nuclear stress test 12/2020 was negative for ischemia. EP felt RV pacing was contributing to systolic dysfunction so underwent upgrade to CRT-P in 03/2021 with subsequent normalization of LVEF. Though he's done reasonably well from a cardiac standpoint since that time, he has had several complex medical admissions. He's had progressive cognitive decline ultimately felt to have Lewy body dementia and ground level fall (07/2021) 2/2 pre-syncope with right hip fracture, rib fracture, subdural hematoma, possible aspiration penumonitis, delirium, and complicated UTI. He underwent hip replacement. His SDH was managed conservatively. I  also take care of his wife Tomer Chalmers who previously relayed she was concerned during the hospitalization she felt as though the focus was on his dementia and less so on care of his other medical problems. Of note, she has previously requested not to specifically discuss dementia diagnosis with the patient as he would get upset and worked up about this. He has not been on more aggressive doses of diuretic due to frequent urination, utilizing adult diaper. Amlodipine and losartan were previously discontinued due to hypotension. At last OV, Lasix was changed to torsemide, metoprolol decreased, and prescribed midodrine due to orthostasis. Repeat echo 09/2022 EF 55-60%, mild LVH, G1Dd, mildly enlarged RV with normal RV function, mild LAE, mild MR, moderate AI, borderline dilation of aortic root and ascending aorta.  He is seen for follow-up largely about the same. He continues to report some issues with ongoing LE edema. Unfortunately reclining with his feet up can be uncomfortable on his back so he does spend time with them down. He is not complaining of any chest pain. As in previous visit he notes issues with periodic coughing and episodes of changing in his breathing without rhyme or reason. Per family it is becoming difficult to try and get him to bathe regularly. They did not start the midodrine yet because they were concerned about him taking it then laying down, because his resting cycles are not consistent. He is eating in the middle of the night and has gained some weight with this. Asher Muir reports he uses the salt shaker a lot. As in prior visits there is some discord between the patient's spotty recollection of events versus his family's. They  have been doing the torsemide 1/2 tablet daily. SBP 130 today though at recent [facility] app[facility] was 98/45 (no specific symptoms with this, though does have periodic dizziness).  Labwork independently reviewed: 08/2022 CBC wnl, K 4.0, Cr 1.02, LFTs ok, trop neg,  d-dimer neg for age adjusted, TSH wnl 03/2022 CBC Wnl, K 3.8, Cr 0.93, LFTs ok, A1c 5.9, LDL 65, trig 103   Past History   Past Medical History:  Diagnosis Date   Aortic insufficiency    Atypical chest pain    Cardiac resynchronization therapy pacemaker (CRT-P) in place    Cerebral aneurysm    a. s/p clipping at [facility] in 8/13 c/b short-term memory loss, cerebral hemorrhage and seizure disorder s/p VP shunt.   Cerebral hemorrhage (HCC)    Chronic combined systolic and diastolic CHF (congestive heart failure) (HCC)    CVA (cerebral infarction)    Diverticulosis    Enteritis (regional)    Dr Juanda Chance   First degree AV block    GI bleed    HTN (hypertension)    Hyperlipidemia    Hypertensive heart disease    IBS (irritable bowel syndrome)    Lewy body dementia (HCC)    Mild CAD    Cardiac cath 01/12/18 showed minimal nonobstructive CAD, normal LVEF, normal LVEDP.   Mild dilation of ascending aorta (HCC)    Mitral regurgitation    NSVT (nonsustained ventricular tachycardia) (HCC)    Orthostatic hypotension    Pacemaker    Pre-diabetes    PVC's (premature ventricular contractions)    Regional enteritis of large intestine (HCC) since 1978   Rheumatoid arthritis(714.0)    dxed in [employer] in 1980s   Second degree AV block, Mobitz type I    Seizures (HCC)    last April 26,2021   Sinus bradycardia    Sleep apnea    SSS (sick sinus syndrome) (HCC)    Subdural hematoma (HCC)     Past Surgical History:  Procedure Laterality Date   BIV PACEMAKER INSERTION CRT-P N/A 03/10/2021   Procedure: BIV PACEMAKER INSERTION CRT-P UPGRADE;  Surgeon: Regan Lemming, MD;  Location: [facility] INVASIVE CV LAB;  Service: Cardiovascular;  Laterality: N/A;   CHOLECYSTECTOMY N/A 01/07/2016   Procedure: LAPAROSCOPIC CHOLECYSTECTOMY ;  Surgeon: Abigail Miyamoto, MD;  Location: [facility] OR;  Service: General;  Laterality: N/A;   cns shunt  02/23/12   COLONOSCOPY W/ POLYPECTOMY  1978   negative 2009, Dr Juanda Chance. Due 2014    CRANIOTOMY  02/02/12   Dr Kelby Aline, [facility]-clipping of aneurysm   CRANIOTOMY  02-02-12   left pterional craniotomy for clipping complex anterior communicating artery aneurysm    HERNIA REPAIR     LEFT HEART CATH AND CORONARY ANGIOGRAPHY N/A 01/12/2018   Procedure: LEFT HEART CATH AND CORONARY ANGIOGRAPHY;  Surgeon: Swaziland, Peter M, MD;  Location: [facility] INVASIVE CV LAB;  Service: Cardiovascular;  Laterality: N/A;   LEFT HEART CATHETERIZATION WITH CORONARY ANGIOGRAM N/A 11/26/2013   Procedure: LEFT HEART CATHETERIZATION WITH CORONARY ANGIOGRAM;  Surgeon: Marykay Lex, MD;  Location: [facility] CATH LAB;  Service: Cardiovascular;  Laterality: N/A;   NOSE SURGERY     PACEMAKER IMPLANT N/A 07/02/2018   Procedure: PACEMAKER IMPLANT;  Surgeon: Regan Lemming, MD;  Location: [facility] INVASIVE CV LAB;  Service: Cardiovascular;  Laterality: N/A;   SHOULDER SURGERY  1997   TONSILLECTOMY AND ADENOIDECTOMY     VENTRICULOPERITONEAL SHUNT  02-23-12   INSERTION OF RIGHT FRONTAL VENTRICULOPERITONEAL SHUNT WITH A CODMAN HAKIM PROGRAMMABLE VALVE  Current Medications: Current Meds  Medication Sig   acetaminophen (TYLENOL) 325 MG tablet Take 325-650 mg by mouth every 6 (six) hours as needed (pain/headaches.).   carbidopa-levodopa (SINEMET IR) 25-100 MG tablet as directed.   cetirizine (ZYRTEC) 10 MG tablet Take 1 tablet (10 mg total) by mouth daily.   Cholecalciferol (VITAMIN D) 50 MCG (2000 UT) CAPS Take 2,000 Units by mouth in the morning.   clonazePAM (KLONOPIN) 1 MG disintegrating tablet Take 1 mg by mouth as directed. ALLOW 1 TABLET TO DISSOLVE IN CHEEK FOR SEIZURES LASTING LONGER THAN 4 MINUTES. OR AFTER 2ND SEIZURE IN 24 HOURS   donepezil (ARICEPT) 10 MG tablet Take half a tablet in the morning for a month and then increase to 1 full tablet daily.   finasteride (PROSCAR) 5 MG tablet Take 5 mg by mouth daily.   ipratropium (ATROVENT) 0.06 % nasal spray Place into the nose as needed for rhinitis.   levETIRAcetam  (KEPPRA) 500 MG tablet Take 3 tablets by mouth in the morning and at bedtime.   loperamide (IMODIUM) 2 MG capsule Take 2 mg by mouth as needed.   LORazepam (ATIVAN) 1 MG tablet Take 1 mg by mouth every 6 (six) hours as needed for seizure.    metoprolol succinate (TOPROL XL) 25 MG 24 hr tablet Take 0.5 tablets (12.5 mg total) by mouth daily.   Midazolam (NAYZILAM) 5 MG/0.1ML SOLN Place 1 each into the nose as needed (seizure).   midodrine (PROAMATINE) 2.5 MG tablet Take 1 tablet (2.5 mg total) by mouth 3 (three) times daily with meals. Not yet started.   mirabegron ER (MYRBETRIQ) 50 MG TB24 tablet Take 1 tablet (50 mg total) by mouth daily.   nitroGLYCERIN (NITROSTAT) 0.4 MG SL tablet PLACE 1 TABLET (0.4 MG TOTAL) UNDER THE TONGUE EVERY 5 (FIVE) MINUTES AS NEEDED FOR CHEST PAIN.   nystatin ointment (MYCOSTATIN) Apply 1 Application topically 2 (two) times daily.   Propylene Glycol (SYSTANE BALANCE) 0.6 % SOLN Apply 1 drop to eye daily as needed (dry eyes).   pyridOXINE (VITAMIN B-6) 100 MG tablet Take 100 mg by mouth in the morning.   rosuvastatin (CRESTOR) 5 MG tablet Take 5 mg by mouth 3 (three) times a week.   Tamsulosin HCl (FLOMAX) 0.4 MG CAPS Take 0.4 mg by mouth every evening.   thiamine 100 MG tablet Take 100 mg by mouth in the morning.   torsemide (DEMADEX) 20 MG tablet Take 0.5 tablets (10 mg total) by mouth daily.   triamcinolone cream (KENALOG) 0.5 % Apply 1 Application topically as needed (break out prevention).   VIMPAT 200 MG TABS tablet Take 200 mg by mouth 2 (two) times daily.   vitamin B-12 (CYANOCOBALAMIN) 1000 MCG tablet Take 1,000 mcg by mouth in the morning.    Allergies:   Haloperidol, Rosuvastatin, Simvastatin, Lisinopril, Nifedipine, and Pravastatin   Social History   Socioeconomic History   Marital status: Married    Spouse name: Not on file   Number of children: 1   Years of education: Not on file   Highest education level: Not on file  Occupational History    Occupation: retired/ Heritage manager  Tobacco Use   Smoking status: Former    Packs/day: 0.00    Years: 30.00    Additional pack years: 0.00    Total pack years: 0.00    Types: Pipe, Cigarettes    Quit date: 07/04/2008    Years since quitting: 14.3   Smokeless tobacco: Former  Advertising account planner  Vaping Use: Never used  Substance and Sexual Activity   Alcohol use: Yes    Alcohol/week: 0.0 standard drinks of alcohol    Comment: Very Infrequently    Drug use: No   Sexual activity: Not Currently  Other Topics Concern   Not on file  Social History Narrative   Not on file   Social Determinants of Health   Financial Resource Strain: Low Risk  (05/30/2022)   Overall Financial Resource Strain (CARDIA)    Difficulty of Paying Living Expenses: Not hard at all  Food Insecurity: No Food Insecurity (05/30/2022)   Hunger Vital Sign    Worried About Running Out of Food in the Last Year: Never true    Ran Out of Food in the Last Year: Never true  Transportation Needs: No Transportation Needs (05/30/2022)   PRAPARE - Administrator, Civil Service (Medical): No    Lack of Transportation (Non-Medical): No  Physical Activity: Insufficiently Active (05/30/2022)   Exercise Vital Sign    Days of Exercise per Week: 3 days    Minutes of Exercise per Session: 30 min  Stress: No Stress Concern Present (05/30/2022)   [employer] of Occupational Health - Occupational Stress Questionnaire    Feeling of Stress : Only a little  Social Connections: Moderately Isolated (05/30/2022)   Social Connection and Isolation Panel [NHANES]    Frequency of Communication with Friends and Family: More than three times a week    Frequency of Social Gatherings with Friends and Family: More than three times a week    Attends Religious Services: Never    Database administrator or Organizations: No    Attends Engineer, structural: Never    Marital Status: Married     Family History:  The patient's family  history includes COPD in his father; Coronary artery disease in his father; Dementia (age of onset: 61) in his sister; Diabetes in his brother and sister; Hepatitis in his mother. There is no history of Colon cancer, Esophageal cancer, Rectal cancer, Stomach cancer, or Colon polyps.  ROS:   Please see the history of present illness. Patient states he does not understand why when he bathes his wife has to stick her finger up his anus to feel the stool. Asher Muir was visibly shocked when he said this and clarifies that she has had to assist in helping him clean himself but stated she has never done this. He also reports that his testicles historically hang lower and he sometimes feels like this makes him have to urinate more. I suspect his memory makes ROS generally challenging to fully ascertain. Family also reports his intermittent blurry vision/focusing issues continue to be a problem.   EKG(s)/Additional Testing   EKG:  EKG is not ordered today  CV Studies: Cardiac studies reviewed are outlined and summarized above. Otherwise please see EMR for full report.  Recent Labs: 08/22/2022: ALT 26; BUN 20; Creatinine, Ser 1.02; Hemoglobin 15.0; Platelets 173; Potassium 4.0; Sodium 138; TSH 1.820  Recent Lipid Panel    Component Value Date/Time   CHOL 135 03/21/2022 1249   CHOL 139 11/30/2021 0853   CHOL 159 03/27/2013 0818   TRIG 103.0 03/21/2022 1249   TRIG 127 03/27/2013 0818   HDL 49.50 03/21/2022 1249   HDL 52 11/30/2021 0853   HDL 45 03/27/2013 0818   CHOLHDL 3 03/21/2022 1249   VLDL 20.6 03/21/2022 1249   LDLCALC 65 03/21/2022 1249   LDLCALC 71 11/30/2021 0853  LDLCALC 89 03/27/2013 0818   LDLDIRECT 87.6 09/19/2012 1206    PHYSICAL EXAM:    VS:  BP 130/80 (BP Location: Left Arm, Patient Position: Sitting, Cuff Size: Normal)   Pulse 60   Ht 5\' 8"  (1.727 m)   Wt 249 lb 6.4 oz (113.1 kg)   SpO2 95%   BMI 37.92 kg/m   BMI: Body mass index is 37.92 kg/m.  GEN: Well nourished, well  developed male in no acute distress HEENT: normocephalic, atraumatic Neck: no JVD, carotid bruits, or masses Cardiac: RRR; no murmurs, rubs, or gallops, soft mild BLE edema  Respiratory:  clear to auscultation bilaterally, normal work of breathing GI: soft, nontender, nondistended, + BS MS: no deformity or atrophy Skin: warm and dry, no rash Neuro:  Alert and Oriented x 3, Strength and sensation are intact, follows commands, HOH, some perseveration on certain symptoms Psych: euthymic mood, full affect  Wt Readings from Last 3 Encounters:  11/01/22 249 lb 6.4 oz (113.1 kg)  09/19/22 244 lb (110.7 kg)  08/22/22 250 lb (113.4 kg)     ASSESSMENT & PLAN:   1. Chronic combined CHF/presumed NICM, complicated by orthostatic hypotension  - do not get the senses there have been dramatic changes with this. Per shared decision making we'll trial increasing torsemide to 20mg  daily alternating with 10mg  daily. I do not think we can safely be more aggressive due to his orthostasis hx. Check BMET today. They did not start midodrine. SBP has been variable from 98/45 to 130/80 by recent VS review. Has likely multifactorial historical periodic dizziness. They are concerned about taking midodrine during periods of time where he may be resting or sleeping. We'll have him simply take 2.5mg  once daily to be timed in preparation for his most active time of day. If he develops isses with worsening HTN we can stop this altogether. Not a good candidate for SGLT2i given hygiene/bathing concerns, prior UTI, orthostasis.  2. Minimal CAD with chronic chest pain - not on ASA due to SDH. Fortunately chest pain is not a salient complaint today. LDL at goal 03/2022. Consider repeat lipid profile 03/2023, can finalized in follow-up.  3. H/o PVCs - quiescent by symptoms. Continue low dose metoprolol as we are able.  4. Moderate AI, mild MR, mild dilation of aorta - by echo 09/2022, no acute intervention needed on this. Consider  repeat echo in 09/2023, can be decided closer to that time.     Disposition: F/u with me in 3-4 months per patient's family request. Has EP f/u 12/2022.   Medication Adjustments/Labs and Tests Ordered: Current medicines are reviewed at length with the patient today.  Concerns regarding medicines are outlined above. Medication changes, Labs and Tests ordered today are summarized above and listed in the Patient Instructions accessible in Encounters.   Signed, Laurann Montana, PA-C  11/01/2022 11:25 AM    [facility] Phone: [phone]; Fax: [phone]

## 2022-11-01 ENCOUNTER — Encounter: Payer: Self-pay | Admitting: Physician Assistant

## 2022-11-01 ENCOUNTER — Ambulatory Visit: Payer: Medicare Other | Attending: Physician Assistant | Admitting: Physician Assistant

## 2022-11-01 VITALS — BP 130/80 | HR 60 | Ht 68.0 in | Wt 249.4 lb

## 2022-11-01 DIAGNOSIS — I493 Ventricular premature depolarization: Secondary | ICD-10-CM

## 2022-11-01 DIAGNOSIS — I77819 Aortic ectasia, unspecified site: Secondary | ICD-10-CM

## 2022-11-01 DIAGNOSIS — I351 Nonrheumatic aortic (valve) insufficiency: Secondary | ICD-10-CM | POA: Diagnosis not present

## 2022-11-01 DIAGNOSIS — I2510 Atherosclerotic heart disease of native coronary artery without angina pectoris: Secondary | ICD-10-CM | POA: Diagnosis not present

## 2022-11-01 DIAGNOSIS — I5042 Chronic combined systolic (congestive) and diastolic (congestive) heart failure: Secondary | ICD-10-CM

## 2022-11-01 DIAGNOSIS — I951 Orthostatic hypotension: Secondary | ICD-10-CM

## 2022-11-01 DIAGNOSIS — I340 Nonrheumatic mitral (valve) insufficiency: Secondary | ICD-10-CM

## 2022-11-01 LAB — BASIC METABOLIC PANEL
BUN/Creatinine Ratio: 13 (ref 10–24)
BUN: 12 mg/dL (ref 8–27)
CO2: 26 mmol/L (ref 20–29)
Calcium: 9.3 mg/dL (ref 8.6–10.2)
Chloride: 98 mmol/L (ref 96–106)
Creatinine, Ser: 0.93 mg/dL (ref 0.76–1.27)
Glucose: 104 mg/dL — ABNORMAL HIGH (ref 70–99)
Potassium: 4.1 mmol/L (ref 3.5–5.2)
Sodium: 138 mmol/L (ref 134–144)
eGFR: 84 mL/min/{1.73_m2} (ref >59)

## 2022-11-01 MED ORDER — TORSEMIDE 20 MG PO TABS: ORAL_TABLET | ORAL | 3 refills | Status: DC

## 2022-11-01 MED ORDER — MIDODRINE HCL 2.5 MG PO TABS: 2.5000 mg | ORAL_TABLET | Freq: Every day | ORAL | 3 refills | Status: DC

## 2022-11-01 NOTE — Patient Instructions (Signed)
Medication Instructions:   CHANGE Torsemide one (1) tablet by mouth ( 20 mg) alternating with one half (0.5) tablet by mouth ( 10 mg).  CHANGE Midodrine one ( 1) tablet by mouth ( 2.5 mg) daily.  *If you need a refill on your cardiac medications before your next appointment, please call your pharmacy*   Lab Work:  TODAY!!!!! BMET  If you have labs (blood work) drawn today and your tests are completely normal, you will receive your results only by: MyChart Message (if you have MyChart) OR A paper copy in the mail If you have any lab test that is abnormal or we need to change your treatment, we will call you to review the results.   Testing/Procedures:  None ordered.   Follow-Up: At [facility], you and your health needs are our priority.  As part of our continuing mission to provide you with exceptional heart care, we have created designated Provider Care Teams.  These Care Teams include your primary Cardiologist (physician) and Advanced Practice Providers (APPs -  Physician Assistants and Nurse Practitioners) who all work together to provide you with the care you need, when you need it.  We recommend signing up for the patient portal called "MyChart".  Sign up information is provided on this After Visit Summary.  MyChart is used to connect with patients for Virtual Visits (Telemedicine).  Patients are able to view lab/test results, encounter notes, upcoming appointments, etc.  Non-urgent messages can be sent to your provider as well.   To learn more about what you can do with MyChart, go to [email or web address].    Your next appointment:   4 month(s)  Provider:   Ronie Spies, PA-C

## 2022-11-02 ENCOUNTER — Telehealth: Payer: Self-pay

## 2022-11-02 NOTE — Telephone Encounter (Signed)
-----   Message from Laurann Montana, [place] sent at 11/02/2022  8:54 AM EDT ----- Lorain Childes when calling result, patient has some dementia so wife Jeff Weaver helps with care.) BMET is stable, only abnormality is minimal glucose elevation. Since we had increased torsemide slightly at OV yesterday, let's get a repeat BMET in 1-2 weeks, does not need to be fasting.

## 2022-11-02 NOTE — Telephone Encounter (Signed)
Spoke with wife and she is aware of lab results. Verbalized understanding. Lab scheduled for 5/15

## 2022-11-06 ENCOUNTER — Other Ambulatory Visit: Payer: Self-pay | Admitting: Internal Medicine

## 2022-11-16 ENCOUNTER — Ambulatory Visit: Payer: Medicare Other | Attending: Physician Assistant

## 2022-11-16 ENCOUNTER — Other Ambulatory Visit: Payer: Self-pay

## 2022-11-16 DIAGNOSIS — I351 Nonrheumatic aortic (valve) insufficiency: Secondary | ICD-10-CM

## 2022-11-16 DIAGNOSIS — I5042 Chronic combined systolic (congestive) and diastolic (congestive) heart failure: Secondary | ICD-10-CM

## 2022-11-17 LAB — BASIC METABOLIC PANEL
BUN/Creatinine Ratio: 18 (ref 10–24)
BUN: 19 mg/dL (ref 8–27)
CO2: 24 mmol/L (ref 20–29)
Calcium: 9.7 mg/dL (ref 8.6–10.2)
Chloride: 96 mmol/L (ref 96–106)
Creatinine, Ser: 1.03 mg/dL (ref 0.76–1.27)
Glucose: 104 mg/dL — ABNORMAL HIGH (ref 70–99)
Potassium: 3.7 mmol/L (ref 3.5–5.2)
Sodium: 141 mmol/L (ref 134–144)
eGFR: 74 mL/min/{1.73_m2} (ref >59)

## 2022-11-22 DIAGNOSIS — G20C Parkinsonism, unspecified: Secondary | ICD-10-CM | POA: Diagnosis not present

## 2022-11-22 DIAGNOSIS — R29898 Other symptoms and signs involving the musculoskeletal system: Secondary | ICD-10-CM | POA: Diagnosis not present

## 2022-11-22 DIAGNOSIS — K117 Disturbances of salivary secretion: Secondary | ICD-10-CM | POA: Diagnosis not present

## 2022-11-25 ENCOUNTER — Ambulatory Visit (INDEPENDENT_AMBULATORY_CARE_PROVIDER_SITE_OTHER): Payer: Medicare Other

## 2022-11-25 DIAGNOSIS — I495 Sick sinus syndrome: Secondary | ICD-10-CM

## 2022-11-25 LAB — CUP PACEART REMOTE DEVICE CHECK
Battery Remaining Longevity: 119 mo
Battery Voltage: 3 V
Brady Statistic AP VP Percent: 77.25 %
Brady Statistic AP VS Percent: 0.01 %
Brady Statistic AS VP Percent: 22.6 %
Brady Statistic AS VS Percent: 0.15 %
Brady Statistic RA Percent Paced: 77.29 %
Brady Statistic RV Percent Paced: 99.84 %
Date Time Interrogation Session: 20240523233810
Implantable Lead Connection Status: 753985
Implantable Lead Connection Status: 753985
Implantable Lead Connection Status: 753985
Implantable Lead Implant Date: 20191230
Implantable Lead Implant Date: 20191230
Implantable Lead Implant Date: 20220907
Implantable Lead Location: 753858
Implantable Lead Location: 753859
Implantable Lead Location: 753860
Implantable Lead Model: 4598
Implantable Lead Model: 5076
Implantable Lead Model: 5076
Implantable Pulse Generator Implant Date: 20220907
Lead Channel Impedance Value: 304 Ohm
Lead Channel Impedance Value: 380 Ohm
Lead Channel Impedance Value: 380 Ohm
Lead Channel Impedance Value: 399 Ohm
Lead Channel Impedance Value: 399 Ohm
Lead Channel Impedance Value: 399 Ohm
Lead Channel Impedance Value: 456 Ohm
Lead Channel Impedance Value: 456 Ohm
Lead Channel Impedance Value: 589 Ohm
Lead Channel Impedance Value: 627 Ohm
Lead Channel Impedance Value: 627 Ohm
Lead Channel Impedance Value: 703 Ohm
Lead Channel Impedance Value: 722 Ohm
Lead Channel Impedance Value: 722 Ohm
Lead Channel Pacing Threshold Amplitude: 0.75 V
Lead Channel Pacing Threshold Amplitude: 0.75 V
Lead Channel Pacing Threshold Amplitude: 1 V
Lead Channel Pacing Threshold Pulse Width: 0.4 ms
Lead Channel Pacing Threshold Pulse Width: 0.4 ms
Lead Channel Pacing Threshold Pulse Width: 0.4 ms
Lead Channel Sensing Intrinsic Amplitude: 1.75 mV
Lead Channel Sensing Intrinsic Amplitude: 1.75 mV
Lead Channel Sensing Intrinsic Amplitude: 20.625 mV
Lead Channel Sensing Intrinsic Amplitude: 20.625 mV
Lead Channel Setting Pacing Amplitude: 1.25 V
Lead Channel Setting Pacing Amplitude: 1.5 V
Lead Channel Setting Pacing Amplitude: 2 V
Lead Channel Setting Pacing Pulse Width: 0.4 ms
Lead Channel Setting Pacing Pulse Width: 0.4 ms
Lead Channel Setting Sensing Sensitivity: 1.2 mV
Zone Setting Status: 755011
Zone Setting Status: 755011

## 2022-12-08 ENCOUNTER — Encounter: Payer: Self-pay | Admitting: Cardiology

## 2022-12-08 ENCOUNTER — Ambulatory Visit: Payer: Medicare Other | Attending: Cardiology | Admitting: Cardiology

## 2022-12-08 VITALS — BP 150/76 | HR 62 | Ht 68.0 in | Wt 236.0 lb

## 2022-12-08 DIAGNOSIS — G4733 Obstructive sleep apnea (adult) (pediatric): Secondary | ICD-10-CM | POA: Diagnosis not present

## 2022-12-08 DIAGNOSIS — I10 Essential (primary) hypertension: Secondary | ICD-10-CM | POA: Diagnosis not present

## 2022-12-08 DIAGNOSIS — I5022 Chronic systolic (congestive) heart failure: Secondary | ICD-10-CM | POA: Insufficient documentation

## 2022-12-08 DIAGNOSIS — R001 Bradycardia, unspecified: Secondary | ICD-10-CM | POA: Diagnosis not present

## 2022-12-08 LAB — CUP PACEART INCLINIC DEVICE CHECK
Date Time Interrogation Session: 20240606124011
Implantable Lead Connection Status: 753985
Implantable Lead Connection Status: 753985
Implantable Lead Connection Status: 753985
Implantable Lead Implant Date: 20191230
Implantable Lead Implant Date: 20191230
Implantable Lead Implant Date: 20220907
Implantable Lead Location: 753858
Implantable Lead Location: 753859
Implantable Lead Location: 753860
Implantable Lead Model: 4598
Implantable Lead Model: 5076
Implantable Lead Model: 5076
Implantable Pulse Generator Implant Date: 20220907

## 2022-12-08 NOTE — Patient Instructions (Signed)
Medication Instructions:  Your physician recommends that you continue on your current medications as directed. Please refer to the Current Medication list given to you today. *If you need a refill on your cardiac medications before your next appointment, please call your pharmacy*  Follow-Up: At [facility], you and your health needs are our priority.  As part of our continuing mission to provide you with exceptional heart care, we have created designated Provider Care Teams.  These Care Teams include your primary Cardiologist (physician) and Advanced Practice Providers (APPs -  Physician Assistants and Nurse Practitioners) who all work together to provide you with the care you need, when you need it.  We recommend signing up for the patient portal called "MyChart".  Sign up information is provided on this After Visit Summary.  MyChart is used to connect with patients for Virtual Visits (Telemedicine).  Patients are able to view lab/test results, encounter notes, upcoming appointments, etc.  Non-urgent messages can be sent to your provider as well.   To learn more about what you can do with MyChart, go to [email or web address].    Your next appointment:   1 year(s)  Provider:   Will Camnitz, MD   

## 2022-12-08 NOTE — Progress Notes (Signed)
Electrophysiology Office Note   Date:  12/08/2022   ID:  Jeff Weaver, Jeff Weaver [birth year], MRN [id number]  PCP:  Pincus Sanes, MD  Cardiologist:  Mayford Knife Primary Electrophysiologist:  Quentina Fronek Jorja Loa, MD    No chief complaint on file.     History of Present Illness: Jeff Weaver is a 77 y.o. male who is being seen today for the evaluation of bradycardia at the request of Ronie Spies. Presenting today for electrophysiology evaluation.    He has a history significant for obesity, orthostasis, hyper tension, prediabetes, cerebral aneurysm post clipping, seizure disorders post VP shunt, sleep apnea, aortic insufficiency.  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  He developed systolic dysfunction and is post CRT-P upgrade 03/10/2021.  Repeat echo shows normalization of his ejection fraction. He was hospitalized with progressive cognitive decline.  He was found to have Lewy body dementia.  He had a fall January 2023 and had a hip fracture, rib fracture, subdural hematoma, aspiration pneumonitis, delirium, complicated by UTI.  He had hip replacement surgery.  Subdural hematoma was managed conservatively.  Today, denies symptoms of palpitations, chest pain, shortness of breath, orthopnea, PND, lower extremity edema, claudication, dizziness, presyncope, syncope, bleeding, or neurologic sequela. The patient is tolerating medications without difficulties.  Today he has no chest pain.  He has no shortness of breath.  His main complaint is fatigue.  Aside from that, he has no acute problems.    Past Medical History:  Diagnosis Date   Aortic insufficiency    Atypical chest pain    Cardiac resynchronization therapy pacemaker (CRT-P) in place    Cerebral aneurysm    a. s/p clipping at [facility] in 8/13 c/b short-term memory loss, cerebral hemorrhage and seizure disorder s/p VP shunt.   Cerebral hemorrhage (HCC)    Chronic combined  systolic and diastolic CHF (congestive heart failure) (HCC)    CVA (cerebral infarction)    Diverticulosis    Enteritis (regional)    Dr Juanda Chance   First degree AV block    GI bleed    HTN (hypertension)    Hyperlipidemia    Hypertensive heart disease    IBS (irritable bowel syndrome)    Lewy body dementia (HCC)    Mild CAD    Cardiac cath 01/12/18 showed minimal nonobstructive CAD, normal LVEF, normal LVEDP.   Mild dilation of ascending aorta (HCC)    Mitral regurgitation    NSVT (nonsustained ventricular tachycardia) (HCC)    Orthostatic hypotension    Pacemaker    Pre-diabetes    PVC's (premature ventricular contractions)    Regional enteritis of large intestine (HCC) since 1978   Rheumatoid arthritis(714.0)    dxed in [employer] in 1980s   Second degree AV block, Mobitz type I    Seizures (HCC)    last April 26,2021   Sinus bradycardia    Sleep apnea    SSS (sick sinus syndrome) (HCC)    Subdural hematoma (HCC)    Past Surgical History:  Procedure Laterality Date   BIV PACEMAKER INSERTION CRT-P N/A 03/10/2021   Procedure: BIV PACEMAKER INSERTION CRT-P UPGRADE;  Surgeon: Regan Lemming, MD;  Location: [facility] INVASIVE CV LAB;  Service: Cardiovascular;  Laterality: N/A;   CHOLECYSTECTOMY N/A 01/07/2016   Procedure: LAPAROSCOPIC CHOLECYSTECTOMY ;  Surgeon: Abigail Miyamoto, MD;  Location: [facility] OR;  Service: General;  Laterality: N/A;   cns shunt  02/23/12   COLONOSCOPY W/ POLYPECTOMY  1978  negative 2009, Dr Juanda Chance. Due 2014   CRANIOTOMY  02/02/12   Dr Kelby Aline, [facility]-clipping of aneurysm   CRANIOTOMY  02-02-12   left pterional craniotomy for clipping complex anterior communicating artery aneurysm    HERNIA REPAIR     LEFT HEART CATH AND CORONARY ANGIOGRAPHY N/A 01/12/2018   Procedure: LEFT HEART CATH AND CORONARY ANGIOGRAPHY;  Surgeon: Swaziland, Peter M, MD;  Location: [facility] INVASIVE CV LAB;  Service: Cardiovascular;  Laterality: N/A;   LEFT HEART CATHETERIZATION WITH CORONARY ANGIOGRAM N/A  11/26/2013   Procedure: LEFT HEART CATHETERIZATION WITH CORONARY ANGIOGRAM;  Surgeon: Marykay Lex, MD;  Location: [facility] CATH LAB;  Service: Cardiovascular;  Laterality: N/A;   NOSE SURGERY     PACEMAKER IMPLANT N/A 07/02/2018   Procedure: PACEMAKER IMPLANT;  Surgeon: Regan Lemming, MD;  Location: [facility] INVASIVE CV LAB;  Service: Cardiovascular;  Laterality: N/A;   SHOULDER SURGERY  1997   TONSILLECTOMY AND ADENOIDECTOMY     VENTRICULOPERITONEAL SHUNT  02-23-12   INSERTION OF RIGHT FRONTAL VENTRICULOPERITONEAL SHUNT WITH A CODMAN HAKIM PROGRAMMABLE VALVE     Current Outpatient Medications  Medication Sig Dispense Refill   acetaminophen (TYLENOL) 325 MG tablet Take 325-650 mg by mouth every 6 (six) hours as needed (pain/headaches.).     carbidopa-levodopa (SINEMET IR) 25-100 MG tablet as directed.     cetirizine (ZYRTEC) 10 MG tablet Take 1 tablet (10 mg total) by mouth daily. 30 tablet 11   Cholecalciferol (VITAMIN D) 50 MCG (2000 UT) CAPS Take 2,000 Units by mouth in the morning.     clonazePAM (KLONOPIN) 1 MG disintegrating tablet Take 1 mg by mouth as directed. ALLOW 1 TABLET TO DISSOLVE IN CHEEK FOR SEIZURES LASTING LONGER THAN 4 MINUTES. OR AFTER 2ND SEIZURE IN 24 HOURS     donepezil (ARICEPT) 10 MG tablet Take half a tablet in the morning for a month and then increase to 1 full tablet daily. 30 tablet 5   finasteride (PROSCAR) 5 MG tablet Take 5 mg by mouth daily.     ipratropium (ATROVENT) 0.06 % nasal spray Place into the nose as needed for rhinitis.     levETIRAcetam (KEPPRA) 500 MG tablet Take 3 tablets by mouth in the morning and at bedtime.     loperamide (IMODIUM) 2 MG capsule Take 2 mg by mouth as needed.     LORazepam (ATIVAN) 1 MG tablet Take 1 mg by mouth every 6 (six) hours as needed for seizure.      metoprolol succinate (TOPROL-XL) 25 MG 24 hr tablet TAKE 1 TABLET (25 MG TOTAL) BY MOUTH DAILY. 90 tablet 3   Midazolam (NAYZILAM) 5 MG/0.1ML SOLN Place 1 each into the  nose as needed (seizure).     midodrine (PROAMATINE) 2.5 MG tablet Take 1 tablet (2.5 mg total) by mouth daily. 270 tablet 3   mirabegron ER (MYRBETRIQ) 50 MG TB24 tablet Take 1 tablet (50 mg total) by mouth daily. 90 tablet 1   nitroGLYCERIN (NITROSTAT) 0.4 MG SL tablet PLACE 1 TABLET (0.4 MG TOTAL) UNDER THE TONGUE EVERY 5 (FIVE) MINUTES AS NEEDED FOR CHEST PAIN. 25 tablet 11   nystatin ointment (MYCOSTATIN) Apply 1 Application topically 2 (two) times daily. 60 g 3   Propylene Glycol (SYSTANE BALANCE) 0.6 % SOLN Apply 1 drop to eye daily as needed (dry eyes).     pyridOXINE (VITAMIN B-6) 100 MG tablet Take 100 mg by mouth in the morning.     rosuvastatin (CRESTOR) 5 MG tablet Take 5  mg by mouth 3 (three) times a week.     Tamsulosin HCl (FLOMAX) 0.4 MG CAPS Take 0.4 mg by mouth every evening.     thiamine 100 MG tablet Take 100 mg by mouth in the morning.     torsemide (DEMADEX) 20 MG tablet Take 1 tablet (20 mg) by mouth alternating with 1/2 tablet (10 mg) by mouth every other day. 135 tablet 3   triamcinolone cream (KENALOG) 0.5 % Apply 1 Application topically as needed (break out prevention).     VIMPAT 200 MG TABS tablet Take 200 mg by mouth 2 (two) times daily.     vitamin B-12 (CYANOCOBALAMIN) 1000 MCG tablet Take 1,000 mcg by mouth in the morning.     No current facility-administered medications for this visit.    Allergies:   Haloperidol, Rosuvastatin, Simvastatin, Lisinopril, Nifedipine, and Pravastatin   Social History:  The patient  reports that he quit smoking about 14 years ago. His smoking use included pipe and cigarettes. He has quit using smokeless tobacco. He reports current alcohol use. He reports that he does not use drugs.   Family History:  The patient's family history includes COPD in his father; Coronary artery disease in his father; Dementia (age of onset: 10) in his sister; Diabetes in his brother and sister; Hepatitis in his mother.   ROS:  Please see the history  of present illness.   Otherwise, review of systems is positive for none.   All other systems are reviewed and negative.   PHYSICAL EXAM: VS:  BP (!) 150/76   Pulse 62   Ht 5\' 8"  (1.727 m)   Wt 236 lb (107 kg)   SpO2 96%   BMI 35.88 kg/m  , BMI Body mass index is 35.88 kg/m. GEN: Well nourished, well developed, in no acute distress  HEENT: normal  Neck: no JVD, carotid bruits, or masses Cardiac: RRR; no murmurs, rubs, or gallops,no edema  Respiratory:  clear to auscultation bilaterally, normal work of breathing GI: soft, nontender, nondistended, + BS MS: no deformity or atrophy  Skin: warm and dry, device site well healed Neuro:  Strength and sensation are intact Psych: euthymic mood, full affect  EKG:  EKG is not ordered today. Personal review of the ekg ordered 08/12/22 shows AV paced  Personal review of the device interrogation today. Results in [facility]   Recent Labs: 08/22/2022: ALT 26; Hemoglobin 15.0; Platelets 173; TSH 1.820 11/16/2022: BUN 19; Creatinine, Ser 1.03; Potassium 3.7; Sodium 141    Lipid Panel     Component Value Date/Time   CHOL 135 03/21/2022 1249   CHOL 139 11/30/2021 0853   CHOL 159 03/27/2013 0818   TRIG 103.0 03/21/2022 1249   TRIG 127 03/27/2013 0818   HDL 49.50 03/21/2022 1249   HDL 52 11/30/2021 0853   HDL 45 03/27/2013 0818   CHOLHDL 3 03/21/2022 1249   VLDL 20.6 03/21/2022 1249   LDLCALC 65 03/21/2022 1249   LDLCALC 71 11/30/2021 0853   LDLCALC 89 03/27/2013 0818   LDLDIRECT 87.6 09/19/2012 1206     Wt Readings from Last 3 Encounters:  12/08/22 236 lb (107 kg)  11/01/22 249 lb 6.4 oz (113.1 kg)  09/19/22 244 lb (110.7 kg)      Other studies Reviewed: Additional studies/ records that were reviewed today include: LHC 01/12/18  Review of the above records today demonstrates:  The left ventricular systolic function is normal. LV end diastolic pressure is normal. The left ventricular ejection fraction is 55-65%  by visual estimate.      1. Minimal nonobstructive CAD 2. Normal LV function 3. Normal LVEDP  TTE 09/21/22  1. Left ventricular ejection fraction, by estimation, is 55 to 60%. Left  ventricular ejection fraction by 3D volume is 60 %. The left ventricle has  normal function. The left ventricle has no regional wall motion  abnormalities. There is mild concentric  left ventricular hypertrophy. Left ventricular diastolic parameters are  consistent with Grade I diastolic dysfunction (impaired relaxation).   2. Right ventricular systolic function is normal. The right ventricular  size is mildly enlarged.   3. Left atrial size was mildly dilated.   4. The mitral valve is normal in structure. Mild mitral valve  regurgitation.   5. The aortic valve is tricuspid. Aortic valve regurgitation is moderate.  No aortic stenosis is present. Aortic regurgitation PHT measures 399 msec.   6. Aortic dilatation noted. There is borderline dilatation of the aortic  root, measuring 41 mm. There is borderline dilatation of the ascending  aorta, measuring 40 mm.   7. The inferior vena cava is dilated in size with >50% respiratory  variability, suggesting right atrial pressure of 8 mmHg.   Myoview 12/16/2020 The left ventricular ejection fraction is mildly decreased (45-54%). Nuclear stress EF: 46%. There was no ST segment deviation noted during stress. No T wave inversion was noted during stress. The study is normal. This is an intermediate risk study due to reduced systolic function. There is no ischemia.  ASSESSMENT AND PLAN:  1.  Junctional bradycardia: Status post Medtronic dual-chamber pacemaker implanted 05/03/2028.  Developed heart failure is post CRT-P upgrade 03/10/2021.  His histograms are quite flat.  Jahmiyah Dullea turn on rate response.  Otherwise no changes.  2.  Chronic systolic heart failure: Likely due to RV pacing.  Repeat echo shows normalization in his ejection fraction.  3.  Hypertension: Mildly elevated.  Usually  well-controlled.  No changes  4.  Obstructive sleep apnea: CPAP compliance encouraged   Current medicines are reviewed at length with the patient today.   The patient does not have concerns regarding his medicines.  The following changes were made today: None  Labs/ tests ordered today include:  No orders of the defined types were placed in this encounter.     Disposition:   FU 12 months  Signed, Bryauna Byrum Jorja Loa, MD  12/08/2022 12:11 PM     [facility] [address] [place] [address] [phone] (office) [phone] (fax)

## 2022-12-09 DIAGNOSIS — M79672 Pain in left foot: Secondary | ICD-10-CM | POA: Diagnosis not present

## 2022-12-09 DIAGNOSIS — B351 Tinea unguium: Secondary | ICD-10-CM | POA: Diagnosis not present

## 2022-12-09 DIAGNOSIS — M79671 Pain in right foot: Secondary | ICD-10-CM | POA: Diagnosis not present

## 2022-12-13 NOTE — Progress Notes (Signed)
Remote pacemaker transmission.   

## 2022-12-19 ENCOUNTER — Encounter: Payer: Self-pay | Admitting: Internal Medicine

## 2022-12-19 NOTE — Progress Notes (Unsigned)
Subjective:    Patient ID: Jeff Weaver, male    DOB: [birth year], 77 y.o.   MRN: [id number]     HPI Jeff Weaver is here for follow up of his chronic medical problems.    Medications and allergies reviewed with patient and updated if appropriate.  Current Outpatient Medications on File Prior to Visit  Medication Sig Dispense Refill   acetaminophen (TYLENOL) 325 MG tablet Take 325-650 mg by mouth every 6 (six) hours as needed (pain/headaches.).     carbidopa-levodopa (SINEMET IR) 25-100 MG tablet as directed.     cetirizine (ZYRTEC) 10 MG tablet Take 1 tablet (10 mg total) by mouth daily. 30 tablet 11   Cholecalciferol (VITAMIN D) 50 MCG (2000 UT) CAPS Take 2,000 Units by mouth in the morning.     clonazePAM (KLONOPIN) 1 MG disintegrating tablet Take 1 mg by mouth as directed. ALLOW 1 TABLET TO DISSOLVE IN CHEEK FOR SEIZURES LASTING LONGER THAN 4 MINUTES. OR AFTER 2ND SEIZURE IN 24 HOURS     donepezil (ARICEPT) 10 MG tablet Take half a tablet in the morning for a month and then increase to 1 full tablet daily. 30 tablet 5   finasteride (PROSCAR) 5 MG tablet Take 5 mg by mouth daily.     ipratropium (ATROVENT) 0.06 % nasal spray Place into the nose as needed for rhinitis.     levETIRAcetam (KEPPRA) 500 MG tablet Take 3 tablets by mouth in the morning and at bedtime.     loperamide (IMODIUM) 2 MG capsule Take 2 mg by mouth as needed.     LORazepam (ATIVAN) 1 MG tablet Take 1 mg by mouth every 6 (six) hours as needed for seizure.      metoprolol succinate (TOPROL-XL) 25 MG 24 hr tablet TAKE 1 TABLET (25 MG TOTAL) BY MOUTH DAILY. 90 tablet 3   Midazolam (NAYZILAM) 5 MG/0.1ML SOLN Place 1 each into the nose as needed (seizure).     midodrine (PROAMATINE) 2.5 MG tablet Take 1 tablet (2.5 mg total) by mouth daily. 270 tablet 3   mirabegron ER (MYRBETRIQ) 50 MG TB24 tablet Take 1 tablet (50 mg total) by mouth daily. 90 tablet 1   nitroGLYCERIN (NITROSTAT) 0.4 MG SL tablet PLACE 1 TABLET (0.4 MG  TOTAL) UNDER THE TONGUE EVERY 5 (FIVE) MINUTES AS NEEDED FOR CHEST PAIN. 25 tablet 11   nystatin ointment (MYCOSTATIN) Apply 1 Application topically 2 (two) times daily. 60 g 3   Propylene Glycol (SYSTANE BALANCE) 0.6 % SOLN Apply 1 drop to eye daily as needed (dry eyes).     pyridOXINE (VITAMIN B-6) 100 MG tablet Take 100 mg by mouth in the morning.     rosuvastatin (CRESTOR) 5 MG tablet Take 5 mg by mouth 3 (three) times a week.     Tamsulosin HCl (FLOMAX) 0.4 MG CAPS Take 0.4 mg by mouth every evening.     thiamine 100 MG tablet Take 100 mg by mouth in the morning.     torsemide (DEMADEX) 20 MG tablet Take 1 tablet (20 mg) by mouth alternating with 1/2 tablet (10 mg) by mouth every other day. 135 tablet 3   triamcinolone cream (KENALOG) 0.5 % Apply 1 Application topically as needed (break out prevention).     VIMPAT 200 MG TABS tablet Take 200 mg by mouth 2 (two) times daily.     vitamin B-12 (CYANOCOBALAMIN) 1000 MCG tablet Take 1,000 mcg by mouth in the morning.     No  current facility-administered medications on file prior to visit.     Review of Systems     Objective:  There were no vitals filed for this visit. BP Readings from Last 3 Encounters:  12/08/22 (!) 150/76  11/01/22 130/80  09/19/22 106/72   Wt Readings from Last 3 Encounters:  12/08/22 236 lb (107 kg)  11/01/22 249 lb 6.4 oz (113.1 kg)  09/19/22 244 lb (110.7 kg)   There is no height or weight on file to calculate BMI.    Physical Exam     Lab Results  Component Value Date   WBC 7.9 08/22/2022   HGB 15.0 08/22/2022   HCT 43.3 08/22/2022   PLT 173 08/22/2022   GLUCOSE 104 (H) 11/16/2022   CHOL 135 03/21/2022   TRIG 103.0 03/21/2022   HDL 49.50 03/21/2022   LDLDIRECT 87.6 09/19/2012   LDLCALC 65 03/21/2022   ALT 26 08/22/2022   AST 21 08/22/2022   NA 141 11/16/2022   K 3.7 11/16/2022   CL 96 11/16/2022   CREATININE 1.03 11/16/2022   BUN 19 11/16/2022   CO2 24 11/16/2022   TSH 1.820 08/22/2022    INR 1.0 07/17/2017   HGBA1C 5.9 03/21/2022   MICROALBUR 1.0 11/13/2014     Assessment & Plan:    See Problem List for Assessment and Plan of chronic medical problems.

## 2022-12-20 ENCOUNTER — Ambulatory Visit (INDEPENDENT_AMBULATORY_CARE_PROVIDER_SITE_OTHER): Payer: Medicare Other | Admitting: Internal Medicine

## 2022-12-20 VITALS — BP 128/78 | HR 67 | Temp 97.6°F | Ht 68.0 in | Wt 248.6 lb

## 2022-12-20 DIAGNOSIS — I493 Ventricular premature depolarization: Secondary | ICD-10-CM

## 2022-12-20 DIAGNOSIS — I10 Essential (primary) hypertension: Secondary | ICD-10-CM | POA: Diagnosis not present

## 2022-12-20 DIAGNOSIS — K219 Gastro-esophageal reflux disease without esophagitis: Secondary | ICD-10-CM | POA: Diagnosis not present

## 2022-12-20 DIAGNOSIS — R5381 Other malaise: Secondary | ICD-10-CM | POA: Diagnosis not present

## 2022-12-20 DIAGNOSIS — R2689 Other abnormalities of gait and mobility: Secondary | ICD-10-CM

## 2022-12-20 MED ORDER — FAMOTIDINE 40 MG PO TABS
40.0000 mg | ORAL_TABLET | Freq: Every day | ORAL | 1 refills | Status: DC
Start: 2022-12-20 — End: 2023-01-12

## 2022-12-20 NOTE — Assessment & Plan Note (Signed)
Chronic On metoprolol 12.5 mg nightly Also on midodrine 2.5 mg  daily for orthostasis Goal for cardio is SBP less than 150

## 2022-12-20 NOTE — Assessment & Plan Note (Signed)
Chronic Controlled On metoprolol 12.5 mg nightly-continue

## 2022-12-20 NOTE — Assessment & Plan Note (Signed)
Chronic Worsening with his multiple medical problems and being sedentary Would benefit from PT and he agrees to do this-ordered

## 2022-12-20 NOTE — Patient Instructions (Addendum)
      Medications changes include :   famotidine 40 mg daily    A referral was ordered PT and someone will call you to schedule an appointment.      Return in about 8 months (around 08/22/2023) for follow up.

## 2022-12-20 NOTE — Assessment & Plan Note (Signed)
No obvious GERD symptoms, but because of his dementia he is a poor historian and I do wonder if his cough and some of the swallow issues are related to reflux Start Pepcid 40 mg daily Family will monitor if his cough improves

## 2022-12-20 NOTE — Assessment & Plan Note (Signed)
Chronic Uses a walker to ambulate, but overall sedentary Has Parkinson's disease, dementia and is physically deconditioned Agrees to do outpatient PT-ordered

## 2022-12-26 DIAGNOSIS — H5111 Convergence insufficiency: Secondary | ICD-10-CM | POA: Diagnosis not present

## 2022-12-26 DIAGNOSIS — H532 Diplopia: Secondary | ICD-10-CM | POA: Diagnosis not present

## 2022-12-26 DIAGNOSIS — M5382 Other specified dorsopathies, cervical region: Secondary | ICD-10-CM | POA: Diagnosis not present

## 2023-01-04 ENCOUNTER — Telehealth: Payer: Self-pay | Admitting: Internal Medicine

## 2023-01-04 NOTE — Telephone Encounter (Signed)
Inbound call from patient's daughter stating patients is having severe bowel incontinence. States patients has been diagnosed with crohn's and IBS. Requesting a call back to be advise, thank you.

## 2023-01-04 NOTE — Telephone Encounter (Signed)
Pts family reports he is having diarrhea with fecal incontinence. Requesting to see Dr. Rhea Belton. Scheduled first available with Dr. Rhea Belton 03/20/23 @4pm  as he has not been seen in quite a while. Also scheduled to see Quentin Mulling PA for more urgent matter, 01/12/23 at 1:30pm. Family aware of appts.

## 2023-01-10 ENCOUNTER — Other Ambulatory Visit (HOSPITAL_BASED_OUTPATIENT_CLINIC_OR_DEPARTMENT_OTHER): Payer: Self-pay

## 2023-01-10 MED ORDER — AMOXICILLIN 500 MG PO CAPS
500.0000 mg | ORAL_CAPSULE | Freq: Three times a day (TID) | ORAL | 0 refills | Status: DC
  Filled 2023-01-10: qty 15, 5d supply, fill #0

## 2023-01-10 NOTE — Progress Notes (Unsigned)
01/10/2023 CEDELL COWFER [id number] 06/10/1946  Referring provider: Pincus Sanes, MD Primary GI doctor: {acdocs:27040}  ASSESSMENT AND PLAN:   There are no diagnoses linked to this encounter.   Patient Care Team: Pincus Sanes, MD as PCP - General (Internal Medicine) Regan Lemming, MD as PCP - Electrophysiology (Cardiology) Quintella Reichert, MD as PCP - Cardiology (Cardiology) Conner, Desiree Lucy (Neurology) Doreatha Martin Maudry Mayhew, MD as Referring Physician (Neurology) Cherly Anderson, MD as Physician Assistant (Urology) Tyler Aas, CCC-SLP as Speech Language Pathologist (Speech Pathology)  HISTORY OF PRESENT ILLNESS: 77 y.o. male with a past medical history of ***and others listed below presents for evaluation of ***.   Patient {Actions; denies-reports:120008} family history of colon cancer or other gastrointestinal malignancies.   He {Actions; denies-reports:120008} blood thinner use.  He {Actions; denies-reports:120008} NSAID use.  He {Actions; denies-reports:120008} ETOH use.   He {Actions; denies-reports:120008} tobacco use.  He {Actions; denies-reports:120008} drug use.    He  reports that he quit smoking about 14 years ago. His smoking use included pipe and cigarettes. He has quit using smokeless tobacco. He reports current alcohol use. He reports that he does not use drugs.  RELEVANT LABS AND IMAGING: CBC    Component Value Date/Time   WBC 7.9 08/22/2022 1504   WBC 7.7 03/21/2022 1249   RBC 4.75 08/22/2022 1504   RBC 4.40 03/21/2022 1249   HGB 15.0 08/22/2022 1504   HCT 43.3 08/22/2022 1504   PLT 173 08/22/2022 1504   MCV 91 08/22/2022 1504   MCH 31.6 08/22/2022 1504   MCH 31.3 07/14/2021 1609   MCHC 34.6 08/22/2022 1504   MCHC 34.0 03/21/2022 1249   RDW 14.0 08/22/2022 1504   LYMPHSABS 1.6 03/21/2022 1249   LYMPHSABS 2.1 01/03/2018 1143   MONOABS 0.4 03/21/2022 1249   EOSABS 0.3 03/21/2022 1249   EOSABS 0.2 01/03/2018 1143   BASOSABS 0.0  03/21/2022 1249   BASOSABS 0.0 01/03/2018 1143   Recent Labs    03/21/22 1249 08/22/22 1504  HGB 14.0 15.0    CMP     Component Value Date/Time   NA 141 11/16/2022 1454   K 3.7 11/16/2022 1454   CL 96 11/16/2022 1454   CO2 24 11/16/2022 1454   GLUCOSE 104 (H) 11/16/2022 1454   GLUCOSE 105 (H) 03/21/2022 1249   BUN 19 11/16/2022 1454   CREATININE 1.03 11/16/2022 1454   CALCIUM 9.7 11/16/2022 1454   PROT 6.6 08/22/2022 1504   ALBUMIN 4.4 08/22/2022 1504   AST 21 08/22/2022 1504   ALT 26 08/22/2022 1504   ALKPHOS 60 08/22/2022 1504   BILITOT 0.3 08/22/2022 1504   GFRNONAA >60 07/14/2021 1609   GFRAA 83 03/18/2019 1011      Latest Ref Rng & Units 08/22/2022    3:04 PM 03/21/2022   12:49 PM 11/30/2021    8:53 AM  Hepatic Function  Total Protein 6.0 - 8.5 g/dL 6.6  7.3  6.6   Albumin 3.8 - 4.8 g/dL 4.4  4.0  4.0   AST 0 - 40 IU/L 21  21  16    ALT 0 - 44 IU/L 26  29  17    Alk Phosphatase 44 - 121 IU/L 60  55  68   Total Bilirubin 0.0 - 1.2 mg/dL 0.3  0.4  0.4       Current Medications:    Current Outpatient Medications (Cardiovascular):    metoprolol succinate (TOPROL-XL) 25 MG 24 hr tablet,  TAKE 1 TABLET (25 MG TOTAL) BY MOUTH DAILY.   midodrine (PROAMATINE) 2.5 MG tablet, Take 1 tablet (2.5 mg total) by mouth daily.   nitroGLYCERIN (NITROSTAT) 0.4 MG SL tablet, PLACE 1 TABLET (0.4 MG TOTAL) UNDER THE TONGUE EVERY 5 (FIVE) MINUTES AS NEEDED FOR CHEST PAIN.   rosuvastatin (CRESTOR) 5 MG tablet, Take 5 mg by mouth 3 (three) times a week.   torsemide (DEMADEX) 20 MG tablet, Take 1 tablet (20 mg) by mouth alternating with 1/2 tablet (10 mg) by mouth every other day.  Current Outpatient Medications (Respiratory):    cetirizine (ZYRTEC) 10 MG tablet, Take 1 tablet (10 mg total) by mouth daily.   ipratropium (ATROVENT) 0.06 % nasal spray, Place into the nose as needed for rhinitis.  Current Outpatient Medications (Analgesics):    acetaminophen (TYLENOL) 325 MG tablet,  Take 325-650 mg by mouth every 6 (six) hours as needed (pain/headaches.).  Current Outpatient Medications (Hematological):    vitamin B-12 (CYANOCOBALAMIN) 1000 MCG tablet, Take 1,000 mcg by mouth in the morning.  Current Outpatient Medications (Other):    carbidopa-levodopa (SINEMET IR) 25-100 MG tablet, as directed.   Cholecalciferol (VITAMIN D) 50 MCG (2000 UT) CAPS, Take 2,000 Units by mouth in the morning.   clonazePAM (KLONOPIN) 1 MG disintegrating tablet, Take 1 mg by mouth as directed. ALLOW 1 TABLET TO DISSOLVE IN CHEEK FOR SEIZURES LASTING LONGER THAN 4 MINUTES. OR AFTER 2ND SEIZURE IN 24 HOURS   donepezil (ARICEPT) 10 MG tablet, Take half a tablet in the morning for a month and then increase to 1 full tablet daily.   famotidine (PEPCID) 40 MG tablet, Take 1 tablet (40 mg total) by mouth daily.   finasteride (PROSCAR) 5 MG tablet, Take 5 mg by mouth daily.   levETIRAcetam (KEPPRA) 500 MG tablet, Take 3 tablets by mouth in the morning and at bedtime.   loperamide (IMODIUM) 2 MG capsule, Take 2 mg by mouth as needed.   LORazepam (ATIVAN) 1 MG tablet, Take 1 mg by mouth every 6 (six) hours as needed for seizure.    Midazolam (NAYZILAM) 5 MG/0.1ML SOLN, Place 1 each into the nose as needed (seizure).   mirabegron ER (MYRBETRIQ) 50 MG TB24 tablet, Take 1 tablet (50 mg total) by mouth daily.   nystatin ointment (MYCOSTATIN), Apply 1 Application topically 2 (two) times daily.   Propylene Glycol (SYSTANE BALANCE) 0.6 % SOLN, Apply 1 drop to eye daily as needed (dry eyes).   pyridOXINE (VITAMIN B-6) 100 MG tablet, Take 100 mg by mouth in the morning.   Tamsulosin HCl (FLOMAX) 0.4 MG CAPS, Take 0.4 mg by mouth every evening.   thiamine 100 MG tablet, Take 100 mg by mouth in the morning.   triamcinolone cream (KENALOG) 0.5 %, Apply 1 Application topically as needed (break out prevention).   VIMPAT 200 MG TABS tablet, Take 200 mg by mouth 2 (two) times daily.  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 s/p clipping at [facility] in 8/13 c/b short-term memory loss, cerebral hemorrhage and seizure disorder s/p VP shunt.   Cerebral hemorrhage (HCC)    Chronic combined systolic and diastolic CHF (congestive heart failure) (HCC)    CVA (cerebral infarction)    Diverticulosis    Enteritis (regional)    Dr Juanda Chance   First degree AV  block    GI bleed    HTN (hypertension)    Hyperlipidemia    Hypertensive heart disease    IBS (irritable bowel syndrome)    Lewy body dementia (HCC)    Mild CAD    Cardiac cath 01/12/18 showed minimal nonobstructive CAD, normal LVEF, normal LVEDP.   Mild dilation of ascending aorta (HCC)    Mitral regurgitation    NSVT (nonsustained ventricular tachycardia) (HCC)    Orthostatic hypotension    Pacemaker    Pre-diabetes    PVC's (premature ventricular contractions)    Regional enteritis of large intestine (HCC) since 1978   Rheumatoid arthritis(714.0)    dxed in [employer] in 1980s   Second degree AV block, Mobitz type I    Seizures (HCC)    last April 26,2021   Sinus bradycardia    Sleep apnea    SSS (sick sinus syndrome) (HCC)    Subdural hematoma (HCC)    Allergies:  Allergies  Allergen Reactions   Haloperidol Rash and Other (See Comments)    Hallucinations, agitation    Rosuvastatin     Leg pain mainly in knees Other reaction(s): Generalized aches and pains (currently tolerates in low doses)   Simvastatin Nausea And Vomiting    2007: nausea & vomiting ; [facility] ( he does not remember such)   Lisinopril Cough   Nifedipine Swelling    Thought to contribute to lower extremity edema    Pravastatin     Rxed by [facility] 2009; "myositis" Ok to take per pt's wife/Dr. Alwyn Ren Other reaction(s): Myositis     Surgical History:  He  has a past surgical history that includes Nose surgery; Colonoscopy w/  polypectomy (1978); Shoulder surgery (1997); Hernia repair; Tonsillectomy and adenoidectomy; Craniotomy (02/02/12); cns shunt (02/23/12); Craniotomy (02-02-12); Ventriculoperitoneal shunt (02-23-12); left heart catheterization with coronary angiogram (N/A, 11/26/2013); Cholecystectomy (N/A, 01/07/2016); LEFT HEART CATH AND CORONARY ANGIOGRAPHY (N/A, 01/12/2018); PACEMAKER IMPLANT (N/A, 07/02/2018); and BIV PACEMAKER INSERTION CRT-P (N/A, 03/10/2021). Family History:  His family history includes COPD in his father; Coronary artery disease in his father; Dementia (age of onset: 1) in his sister; Diabetes in his brother and sister; Hepatitis in his mother.  REVIEW OF SYSTEMS  : All other systems reviewed and negative except where noted in the History of Present Illness.  PHYSICAL EXAM: There were no vitals taken for this visit. General Appearance: Well nourished, in no apparent distress. Head:   Normocephalic and atraumatic. Eyes:  sclerae anicteric,conjunctive pink  Respiratory: Respiratory effort normal, BS equal bilaterally without rales, rhonchi, wheezing. Cardio: RRR with no MRGs. Peripheral pulses intact.  Abdomen: Soft,  {BlankSingle:19197::"Flat","Obese","Non-distended"} ,active bowel sounds. {actendernessAB:27319} tenderness {anatomy; site abdomen:5010}. {BlankMultiple:19196::"Without guarding","With guarding","Without rebound","With rebound"}. No masses. Rectal: {acrectalexam:27461} Musculoskeletal: Full ROM, {PSY - GAIT AND STATION:22860} gait. {With/Without:304960234} edema. Skin:  Dry and intact without significant lesions or rashes Neuro: Alert and  oriented x4;  No focal deficits. Psych:  Cooperative. Normal mood and affect.    Doree Albee, PA-C 1:59 PM

## 2023-01-12 ENCOUNTER — Encounter: Payer: Self-pay | Admitting: Physician Assistant

## 2023-01-12 ENCOUNTER — Ambulatory Visit (INDEPENDENT_AMBULATORY_CARE_PROVIDER_SITE_OTHER)
Admission: RE | Admit: 2023-01-12 | Discharge: 2023-01-12 | Disposition: A | Discharge status: Home / Self Care | Payer: Medicare Other | Source: Ambulatory Visit | Attending: Physician Assistant | Admitting: Physician Assistant

## 2023-01-12 ENCOUNTER — Ambulatory Visit (INDEPENDENT_AMBULATORY_CARE_PROVIDER_SITE_OTHER): Payer: Medicare Other | Admitting: Physician Assistant

## 2023-01-12 ENCOUNTER — Other Ambulatory Visit (INDEPENDENT_AMBULATORY_CARE_PROVIDER_SITE_OTHER): Payer: Medicare Other

## 2023-01-12 VITALS — BP 90/60 | HR 59 | Ht 68.0 in | Wt 249.0 lb

## 2023-01-12 DIAGNOSIS — A09 Infectious gastroenteritis and colitis, unspecified: Secondary | ICD-10-CM | POA: Diagnosis not present

## 2023-01-12 DIAGNOSIS — R1312 Dysphagia, oropharyngeal phase: Secondary | ICD-10-CM

## 2023-01-12 DIAGNOSIS — R159 Full incontinence of feces: Secondary | ICD-10-CM

## 2023-01-12 DIAGNOSIS — R140 Abdominal distension (gaseous): Secondary | ICD-10-CM

## 2023-01-12 DIAGNOSIS — Z452 Encounter for adjustment and management of vascular access device: Secondary | ICD-10-CM | POA: Diagnosis not present

## 2023-01-12 DIAGNOSIS — K5010 Crohn's disease of large intestine without complications: Secondary | ICD-10-CM

## 2023-01-12 DIAGNOSIS — I878 Other specified disorders of veins: Secondary | ICD-10-CM | POA: Diagnosis not present

## 2023-01-12 LAB — CBC WITH DIFFERENTIAL/PLATELET
Basophils Absolute: 0 10*3/uL (ref 0.0–0.1)
Basophils Relative: 0.2 % (ref 0.0–3.0)
Eosinophils Absolute: 0.1 10*3/uL (ref 0.0–0.7)
Eosinophils Relative: 1.5 % (ref 0.0–5.0)
HCT: 41.7 % (ref 39.0–52.0)
Hemoglobin: 14.1 g/dL (ref 13.0–17.0)
Lymphocytes Relative: 17.4 % (ref 12.0–46.0)
Lymphs Abs: 1.7 10*3/uL (ref 0.7–4.0)
MCHC: 33.8 g/dL (ref 30.0–36.0)
MCV: 93.5 fl (ref 78.0–100.0)
Monocytes Absolute: 0.8 10*3/uL (ref 0.1–1.0)
Monocytes Relative: 8 % (ref 3.0–12.0)
Neutro Abs: 7.1 10*3/uL (ref 1.4–7.7)
Neutrophils Relative %: 72.9 % (ref 43.0–77.0)
Platelets: 165 10*3/uL (ref 150.0–400.0)
RBC: 4.46 Mil/uL (ref 4.22–5.81)
RDW: 13.5 % (ref 11.5–15.5)
WBC: 9.8 10*3/uL (ref 4.0–10.5)

## 2023-01-12 LAB — COMPREHENSIVE METABOLIC PANEL
ALT: 12 U/L (ref 0–53)
AST: 20 U/L (ref 0–37)
Albumin: 4 g/dL (ref 3.5–5.2)
Alkaline Phosphatase: 55 U/L (ref 39–117)
BUN: 14 mg/dL (ref 6–23)
CO2: 32 mEq/L (ref 19–32)
Calcium: 9.7 mg/dL (ref 8.4–10.5)
Chloride: 95 mEq/L — ABNORMAL LOW (ref 96–112)
Creatinine, Ser: 0.99 mg/dL (ref 0.40–1.50)
GFR: 72.89 mL/min (ref >60.00)
Glucose, Bld: 117 mg/dL — ABNORMAL HIGH (ref 70–99)
Potassium: 3.6 mEq/L (ref 3.5–5.1)
Sodium: 134 mEq/L — ABNORMAL LOW (ref 135–145)
Total Bilirubin: 0.6 mg/dL (ref 0.2–1.2)
Total Protein: 7 g/dL (ref 6.0–8.3)

## 2023-01-12 LAB — SEDIMENTATION RATE: Sed Rate: 23 mm/hr — ABNORMAL HIGH (ref 0–20)

## 2023-01-12 LAB — TSH: TSH: 1.77 u[IU]/mL (ref 0.35–5.50)

## 2023-01-12 LAB — C-REACTIVE PROTEIN: CRP: <1 mg/dL (ref 0.5–20.0)

## 2023-01-12 NOTE — Patient Instructions (Addendum)
  Your provider has requested that you go to the basement level for lab work before leaving today. Press "B" on the elevator. The lab is located at the first door on the left as you exit the elevator.  I believe your loose stools may be due to something called overflow diarrhea. If you predominantly have constipation, straining or incomplete bowel movements at times the only thing to get through the large amounts of stool is liquid. We will check infection and inflammation first.     This can cause someone to feel like they are having diarrhea yet actually they have too much backup of stool. We can evaluate this with a rectal exam, and x-ray of your abdomen, or CT scan. If this is the case usually doing a combination of Benefiber 1-2 times daily with MiraLAX 17 g daily can be helpful. At times there are medications that can also be helpful for this but generally I start with over-the-counter I also like somebody to get a squatty potty to help straighten out the colon whether having a bowel movement. Sometimes we will schedule for pelvic floor physical therapy if you are having incomplete bowel movements and failure pelvic floor is weak and contributing to the constipation.   Benefiber or Citracel is good for constipation/diarrhea/irritable bowel syndrome, it helps with weight loss and can help lower your bad cholesterol. Please do 1 TBSP in the morning in water, coffee, or tea up to twice a day. It can take up to a month before you can see a difference with your bowel movements. It is cheapest from [employer], [employer], [employer].   Miralax is an osmotic laxative.  It only brings more water into the stool.  This is safe to take daily.  Can take up to 17 gram of miralax twice a day.  Mix with juice or coffee.  Start 1 capful at night for 3-4 days and reassess your response in 3-4 days.  You can increase and decrease the dose based on your response.  Remember, it can take up to 3-4 days to take effect  OR for the effects to wear off.   _______________________________________________________  If your blood pressure at your visit was 140/90 or greater, please contact your primary care physician to follow up on this.  _______________________________________________________  If you are age 79 or older, your body mass index should be between 23-30. Your Body mass index is 37.86 kg/m. If this is out of the aforementioned range listed, please consider follow up with your Primary Care Provider.  If you are age 79 or younger, your body mass index should be between 19-25. Your Body mass index is 37.86 kg/m. If this is out of the aformentioned range listed, please consider follow up with your Primary Care Provider.   ________________________________________________________  The [facility] providers would like to encourage you to use [facility] to communicate with providers for non-urgent requests or questions.  Due to long hold times on the telephone, sending your provider a message by [facility] may be a faster and more efficient way to get a response.  Please allow 48 business hours for a response.  Please remember that this is for non-urgent requests.  _______________________________________________________ It was a pleasure to see you today!  Thank you for trusting me with your gastrointestinal care!       I often pair this with benefiber in the morning to help assure the stool is not too loose.

## 2023-01-19 ENCOUNTER — Ambulatory Visit
Admission: EM | Admit: 2023-01-19 | Discharge: 2023-01-19 | Disposition: A | Discharge status: Home / Self Care | Payer: Medicare Other | Attending: Internal Medicine | Admitting: Internal Medicine

## 2023-01-19 ENCOUNTER — Ambulatory Visit: Payer: Medicare Other | Admitting: Nurse Practitioner

## 2023-01-19 DIAGNOSIS — R410 Disorientation, unspecified: Secondary | ICD-10-CM | POA: Diagnosis not present

## 2023-01-19 LAB — POCT URINALYSIS DIP (MANUAL ENTRY)
Bilirubin, UA: NEGATIVE
Blood, UA: NEGATIVE
Glucose, UA: NEGATIVE mg/dL
Ketones, POC UA: NEGATIVE mg/dL
Leukocytes, UA: NEGATIVE
Nitrite, UA: NEGATIVE
Protein Ur, POC: NEGATIVE mg/dL
Spec Grav, UA: 1.01 (ref 1.010–1.025)
Urobilinogen, UA: 0.2 E.U./dL
pH, UA: 5 (ref 5.0–8.0)

## 2023-01-19 NOTE — ED Provider Notes (Signed)
[facility]    CSN: [id number] Arrival date & time: 01/19/23  1423      History   Chief Complaint Chief Complaint  Patient presents with   Urinary Frequency    HPI Jeff Weaver is a 77 y.o. male presents for evaluation of a UTI.  Patient is accompanied by his wife.  Patient has complex medical history including Lewy body dementia, CVA, CHF, seizure disorder, Parkinson's, Crohn's disease.  Wife reports 2 days ago he developed some intermittent confusion.  She states he gets this when he gets a UTI.  They tried to push fluids over the past day but his symptoms have not changed.  Patient denies any dysuria, hematuria, fevers, frequency, urgency, nausea/vomiting, flank pain.  She was advised by their PCP to double check to make sure he does not have a UTI.  No other concerns at this time.  Urinary Frequency    Past Medical History:  Diagnosis Date   Aortic insufficiency    Atypical chest pain    Cardiac resynchronization therapy pacemaker (CRT-P) in place    Cerebral aneurysm    a. s/p clipping at [facility] in 8/13 c/b short-term memory loss, cerebral hemorrhage and seizure disorder s/p VP shunt.   Cerebral hemorrhage (HCC)    Chronic combined systolic and diastolic CHF (congestive heart failure) (HCC)    CVA (cerebral infarction)    Diverticulosis    Enteritis (regional)    Dr Juanda Chance   First degree AV block    GI bleed    HTN (hypertension)    Hyperlipidemia    Hypertensive heart disease    IBS (irritable bowel syndrome)    Lewy body dementia (HCC)    Mild CAD    Cardiac cath 01/12/18 showed minimal nonobstructive CAD, normal LVEF, normal LVEDP.   Mild dilation of ascending aorta (HCC)    Mitral regurgitation    NSVT (nonsustained ventricular tachycardia) (HCC)    Orthostatic hypotension    Pacemaker    Pre-diabetes    PVC's (premature ventricular contractions)    Regional enteritis of large intestine (HCC) since 1978   Rheumatoid arthritis(714.0)    [place]ed in  [place] in 1980s   Second degree AV block, Mobitz type I    Seizures (HCC)    last April 26,2021   Sinus bradycardia    Sleep apnea    SSS (sick sinus syndrome) (HCC)    Subdural hematoma (HCC)     Patient Active Problem List   Diagnosis Date Noted   Physical deconditioning 12/20/2022   Poor balance 12/20/2022   PVC's (premature ventricular contractions) 09/19/2022   Diplopia 09/19/2022   Parkinsonism 06/19/2022   Primary localized osteoarthrosis of shoulder region 05/10/2022   Recurrent dislocation of shoulder joint 05/10/2022   Regional enteritis (HCC) 05/10/2022   Vestibular hypofunction of left ear 05/10/2022   Sensorineural hearing loss 05/10/2022   Senile osteoporosis 02/03/2022   Pain in right hip 01/05/2022   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   Intertrigo 10/01/2021   HFrEF (heart failure with reduced ejection fraction) (HCC) 09/18/2021   Blepharitis of upper and lower eyelids of both eyes 09/18/2021   Tinea cruris 09/18/2021   Lewy body dementia (HCC) 09/16/2021   Closed fracture of right hip (HCC) 09/16/2021   S/P hip replacement, right  07/2021 09/16/2021   Presence of right artificial hip joint 08/06/2021   Vascular dementia (HCC) 04/29/2021   Cough 04/09/2021   Vitamin D deficiency 04/09/2021   Vitamin B1 deficiency 04/09/2021   Right  arm pain 01/06/2021   Seizures (HCC)    Muscle cramping 12/30/2019   Dizziness 12/30/2019   Unsteady gait 12/30/2019   Pacemaker 11/27/2019   Fatigue 07/09/2019   SSS (sick sinus syndrome) (HCC) 07/02/2018   Bilateral shoulder pain 06/14/2018   Upper airway cough syndrome vs cough variant asthma 03/20/2018   Acute midline low back pain without sciatica 12/25/2017   Depression 10/17/2017   CAD (coronary artery disease), hx of NSTEMI 08/15/2017   Chest pain 07/17/2017   SOBOE (shortness of breath on exertion) 06/08/2017   Bilateral sensorineural hearing loss 06/01/2017   Nasal turbinate hypertrophy 06/01/2017   Dysphagia  05/31/2017   Osteoporosis 01/30/2017   History of cerebral hemorrhage 12/28/2016   Seborrheic dermatitis 07/25/2016   Cognitive communication deficit 06/28/2016   Hypertensive heart disease 06/28/2016   Vitamin B 12 deficiency 03/16/2016   Confusion 03/13/2016   VP (ventriculoperitoneal) shunt status 02/12/2016   Orthostatic hypotension 01/05/2016   Benign prostatic hyperplasia with urinary obstruction 08/04/2014   Urinary urgency 08/04/2014   Gastroesophageal reflux disease 07/14/2014   Polymyalgia rheumatica (HCC) 05/28/2014   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/2014   SBE (subacute bacterial endocarditis) prophylaxis candidate 11/24/2013   Aphasia 07/17/2013   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 seizures, not intractable, without status epilepticus (HCC) 07/17/2013   Subdural hemorrhage (HCC) 07/17/2013   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HCC) 07/17/2013   Pre-diabetes 03/26/2013   Urinary frequency 10/31/2012   Nocturia 10/31/2012   History of cerebral aneurysm repair 10/18/2012   History of stroke without residual deficits 10/18/2012   Ocular rosacea 09/27/2012   Seizure disorder (HCC) 07/23/2012   Entropion 07/10/2012   Hyperopia with astigmatism 07/10/2012   Combined senile cataract 04/13/2012   Presbyopia 04/13/2012   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) 03/01/2012   Cognitive safety issue 02/20/2012   Nonruptured cerebral aneurysm 11/23/2011   Obstructive sleep apnea syndrome 08/09/2011   Hyperlipidemia 02/03/2009   HTN (hypertension) 02/03/2009   History of Crohn's disease 04/17/2008   Allergic rhinitis, unspecified 07/05/1999   Cataract, right eye 07/05/1999    Past Surgical History:  Procedure Laterality Date   BIV PACEMAKER INSERTION CRT-P N/A 03/10/2021   Procedure: BIV PACEMAKER INSERTION CRT-P UPGRADE;  Surgeon: Regan Lemming, MD;  Location: [facility]VASIVE [facility];  Service: Cardiovascular;  Laterality: N/A;    CHOLECYSTECTOMY N/A 01/07/2016   Procedure: LAPAROSCOPIC CHOLECYSTECTOMY ;  Surgeon: Abigail Miyamoto, MD;  Location: [facility] OR;  Service: General;  Laterality: N/A;   cns shunt  02/23/12   COLONOSCOPY W/ POLYPECTOMY  1978   negative 2009, Dr Juanda Chance. Due 2014   CRANIOTOMY  02/02/12   Dr Kelby Aline, [facility]-clipping of aneurysm   CRANIOTOMY  02-02-12   left pterional craniotomy for clipping complex anterior communicating artery aneurysm    HERNIA REPAIR     LEFT HEART CATH AND CORONARY ANGIOGRAPHY N/A 01/12/2018   Procedure: LEFT HEART CATH AND CORONARY ANGIOGRAPHY;  Surgeon: Swaziland, Peter M, MD;  Location: [facility] INVASIVE CV LAB;  Service: Cardiovascular;  Laterality: N/A;   LEFT HEART CATHETERIZATION WITH CORONARY ANGIOGRAM N/A 11/26/2013   Procedure: LEFT HEART CATHETERIZATION WITH CORONARY ANGIOGRAM;  Surgeon: Marykay Lex, MD;  Location[facility] CATH LAB;  Service: Cardiovascular;  Laterality: N/A;   NOSE SURGERY     PACEMAKER IMPLANT N/A 07/02/2018   Procedure: PACEMAKER IMPLANT;  Surgeon: Regan Lemming, MD;  Location: [facility] INVASIVE CV LAB;  Service: Cardiovascular;  Laterality: N/A;   SHOULDER SURGERY  1997  TONSILLECTOMY AND ADENOIDECTOMY     VENTRICULOPERITONEAL SHUNT  02-23-12   INSERTION OF RIGHT FRONTAL VENTRICULOPERITONEAL SHUNT WITH A CODMAN HAKIM PROGRAMMABLE VALVE       Home Medications    Prior to Admission medications   Medication Sig Start Date End Date Taking? Authorizing Provider  acetaminophen (TYLENOL) 325 MG tablet Take 325-650 mg by mouth every 6 (six) hours as needed (pain/headaches.).    [provider]  amoxicillin (AMOXIL) 500 MG capsule Take 500 mg by mouth every 8 (eight) hours.    [provider]  carbidopa-levodopa (SINEMET IR) 25-100 MG tablet as directed. 06/09/22   [provider]  cetirizine (ZYRTEC) 10 MG tablet Take 1 tablet (10 mg total) by mouth daily. 10/26/15   Pincus Sanes, MD  Cholecalciferol (VITAMIN D) 50 MCG (2000 UT) CAPS Take  2,000 Units by mouth in the morning.    [provider]  clonazePAM (KLONOPIN) 1 MG disintegrating tablet Take 1 mg by mouth as directed. ALLOW 1 TABLET TO DISSOLVE IN CHEEK FOR SEIZURES LASTING LONGER THAN 4 MINUTES. OR AFTER 2ND SEIZURE IN 24 HOURS Patient not taking: Reported on 01/12/2023 11/05/19 01/16/23  [provider]  donepezil (ARICEPT) 10 MG tablet Take half a tablet in the morning for a month and then increase to 1 full tablet daily. 05/06/21     finasteride (PROSCAR) 5 MG tablet Take 5 mg by mouth daily. 08/24/22   [provider]  ipratropium (ATROVENT) 0.06 % nasal spray Place into the nose as needed for rhinitis. 06/09/22   [provider]  levETIRAcetam (KEPPRA) 500 MG tablet Take 3 tablets by mouth in the morning and at bedtime. 01/28/22   [provider]  loperamide (IMODIUM) 2 MG capsule Take 2 mg by mouth as needed. 05/09/22   [provider]  LORazepam (ATIVAN) 1 MG tablet Take 1 mg by mouth every 6 (six) hours as needed for seizure.     [provider]  metoprolol succinate (TOPROL-XL) 25 MG 24 hr tablet TAKE 1 TABLET (25 MG TOTAL) BY MOUTH DAILY. 11/07/22   Pincus Sanes, MD  Midazolam (NAYZILAM) 5 MG/0.1ML SOLN Place 1 each into the nose as needed (seizure). Patient not taking: Reported on 01/12/2023    [provider]  midodrine (PROAMATINE) 2.5 MG tablet Take 1 tablet (2.5 mg total) by mouth daily. 11/01/22   Dunn, Tacey Ruiz, PA-C  mirabegron ER (MYRBETRIQ) 50 MG TB24 tablet Take 1 tablet (50 mg total) by mouth daily. 02/28/20   Pincus Sanes, MD  nitroGLYCERIN (NITROSTAT) 0.4 MG SL tablet PLACE 1 TABLET (0.4 MG TOTAL) UNDER THE TONGUE EVERY 5 (FIVE) MINUTES AS NEEDED FOR CHEST PAIN. 07/20/22   Quintella Reichert, MD  nystatin ointment (MYCOSTATIN) Apply 1 Application topically 2 (two) times daily. 05/09/22   Pincus Sanes, MD  Propylene Glycol (SYSTANE BALANCE) 0.6 % SOLN Apply 1 drop to eye daily as needed (dry eyes).     [provider]  pyridOXINE (VITAMIN B-6) 100 MG tablet Take 100 mg by mouth in the morning.    [provider]  rosuvastatin (CRESTOR) 5 MG tablet Take 5 mg by mouth 3 (three) times a week.    [provider]  Tamsulosin HCl (FLOMAX) 0.4 MG CAPS Take 0.4 mg by mouth every evening.    [provider]  thiamine 100 MG tablet Take 100 mg by mouth in the morning.    [provider]  torsemide (DEMADEX) 20 MG  tablet Take 1 tablet (20 mg) by mouth alternating with 1/2 tablet (10 mg) by mouth every other day. 11/01/22   Dunn, Tacey Ruiz, PA-C  triamcinolone cream (KENALOG) 0.5 % Apply 1 Application topically as needed (break out prevention). 10/13/21   [provider]  VIMPAT 200 MG TABS tablet Take 200 mg by mouth 2 (two) times daily. 09/03/19   [provider]  vitamin B-12 (CYANOCOBALAMIN) 1000 MCG tablet Take 1,000 mcg by mouth in the morning.    [provider]    Family History Family History  Problem Relation Age of Onset   COPD Father    Coronary artery disease Father        MI in 33s   Hepatitis Mother    Diabetes Sister    Dementia Sister 76   Diabetes Brother    Colon cancer Neg Hx    Esophageal cancer Neg Hx    Rectal cancer Neg Hx    Stomach cancer Neg Hx    Colon polyps Neg Hx     Social History Social History   Tobacco Use   Smoking status: Former    Current packs/day: 0.00    Types: Pipe, Cigarettes    Quit date: 07/04/1978    Years since quitting: 44.5   Smokeless tobacco: Former  Building services engineer status: Never Used  Substance Use Topics   Alcohol use: Yes    Alcohol/week: 0.0 standard drinks of alcohol    Comment: Very Infrequently    Drug use: No     Allergies   Haloperidol, Rosuvastatin, Simvastatin, Lisinopril, Nifedipine, and Pravastatin   Review of Systems Review of Systems  Genitourinary:  Positive for frequency.  Psychiatric/Behavioral:  Positive for confusion.       Physical Exam Triage Vital Signs ED Triage Vitals [01/19/23 1438]  Encounter Vitals Group     BP 113/73     Systolic BP Percentile      Diastolic BP Percentile      Pulse Rate 61     Resp      Temp (!) 97.5 F (36.4 C)     Temp Source Oral     SpO2 92 %     Weight      Height      Head Circumference      Peak Flow      Pain Score 0     Pain Loc      Pain Education      Exclude from Growth Chart    No data found.  Updated Vital Signs BP 113/73 (BP Location: Right Arm)   Pulse 61   Temp (!) 97.5 F (36.4 C) (Oral)   SpO2 92%   Visual Acuity Right Eye Distance:   Left Eye Distance:   Bilateral Distance:    Right Eye Near:   Left Eye Near:    Bilateral Near:     Physical Exam Vitals and nursing note reviewed.  Constitutional:      General: He is not in acute distress.    Appearance: Normal appearance. He is not ill-appearing.  HENT:     Head: Normocephalic and atraumatic.  Eyes:     Pupils: Pupils are equal, round, and reactive to light.  Cardiovascular:     Rate and Rhythm: Normal rate.  Pulmonary:     Effort: Pulmonary effort is normal.  Abdominal:     Tenderness: There is no right CVA tenderness or left CVA tenderness.  Skin:  General: Skin is warm and dry.  Neurological:     General: No focal deficit present.     Mental Status: He is alert and oriented to person, place, and time.  Psychiatric:        Mood and Affect: Mood normal.        Behavior: Behavior normal.      UC Treatments / Results  Labs (all labs ordered are listed, but only abnormal results are displayed) Labs Reviewed  POCT URINALYSIS DIP (MANUAL ENTRY) - Abnormal; Notable for the following components:      Result Value   Color, UA light yellow (*)    All other components within normal limits  URINE CULTURE    EKG   Radiology No results found.  Procedures Procedures (including critical care time)  Medications Ordered in UC Medications - No data to  display  Initial Impression / Assessment and Plan / UC Course  I have reviewed the triage vital signs and the nursing notes.  Pertinent labs & imaging results that were available during my care of the patient were reviewed by me and considered in my medical decision making (see chart for details).     Discussed exam, concerns and symptoms with patient and his wife.  UA is negative.  Will culture.  Discussed following up with PCP tomorrow or going to the ER for further workup of his intermittent confusion.  Wife and patient verbalized understanding.  Patient was discharged in stable condition and in no acute distress. Final Clinical Impressions(s) / UC Diagnoses   Final diagnoses:  Confusion   Discharge Instructions   None    ED Prescriptions   None    PDMP not reviewed this encounter.   Radford Pax, NP 01/19/23 1524

## 2023-01-19 NOTE — Discharge Instructions (Signed)
Declined AVS

## 2023-01-19 NOTE — ED Triage Notes (Signed)
Pt presents with c/o confusion x 3 days.   Pt wife states she wants UTI testing.    Denies other sxs.

## 2023-01-21 LAB — URINE CULTURE: Culture: <10000 — AB

## 2023-01-23 NOTE — Progress Notes (Unsigned)
Cardiology Office Note    Date:  01/24/2023  ID:  AADAN CHENIER, DOB 01-15-1946, MRN [id number] PCP:  Pincus Sanes, MD  Cardiologist:  Armanda Magic, MD  Electrophysiologist:  Regan Lemming, MD   Chief Complaint: cardiac f/u of HTN, CHF, orthostasis  History of Present Illness: .    Jeff Weaver is a 77 y.o. male with visit-pertinent history of  HTN with h/o orthostasis, HLD, chronic combined CHF/suspected NICM, LBBB, bradycardia/AV block s/p PPM 07/02/18 with upgrade to [employer] CRT-P in 03/2021 due to development of systolic dysfunction felt possibly due to RV pacing, moderate AI, mild MR, mild dilation of aorta, PVCs by monitor, prediabetes, cerebral aneurysm s/p clipping at [facility] in 8/13 c/b memory loss, cerebral hemorrhage and seizure disorder s/p VP shunt, sleep apnea, obesity, fall with SDH 07/2021, and Lewy body dementia who is seen for follow-up.   He has a long history of chronic atypical chest pain with intermittent evaluations over the years. Last cath was in 2019 showing minimal nonobstructive CAD, normal LVEF, normal LVEDP. He had 2nd degree AVB type 1 during cath prompting monitor 02/2018 which showed profound bradycardia, AVB with frequently nonconducted P waves, frequent PVCs (7%) and short runs of NSVT so underwent PPM implantation. Repeat echo 11/2020 demonstrated a decline in EF to 35%. Nuclear stress test 12/2020 was negative for ischemia. EP felt RV pacing was contributing to systolic dysfunction so underwent upgrade to CRT-P in 03/2021 with subsequent normalization of LVEF. Though he's done reasonably well from a cardiac standpoint since that time, he has had several complex medical admissions. He's had progressive cognitive decline ultimately felt to have Lewy body dementia and ground level fall (07/2021) 2/2 pre-syncope with right hip fracture, rib fracture, subdural hematoma, possible aspiration penumonitis, delirium, and complicated UTI. He underwent hip replacement. His  SDH was managed conservatively. I also take care of his wife Rigdon Macomber who previously relayed she was concerned during the hospitalization she felt as though the focus was on his dementia and less so on care of his other medical problems. Of note, she has previously requested not to specifically discuss dementia diagnosis with the patient as he would get upset and worked up about this. Amlodipine and losartan were previously discontinued and metoprolol reduced due to hypotension. He was also prescribed midodrine. Lasix was previously changed to torsemide for LE edema. He has not been on more aggressive doses of diuretic due to frequent urination, utilizing adult diaper.Repeat echo 09/2022 EF 55-60%, mild LVH, G1Dd, mildly enlarged RV with normal RV function, mild LAE, mild MR, moderate AI, borderline dilation of aortic root and ascending aorta. He saw Dr. Elberta Fortis 12/08/22 who noted histograms were quite flat so rate response was adjusted. He was seen in ED 01/19/23 with increased confusion and negative UA. This improved.  He is seen back for follow-up overall doing reasonably well.He has had variable BP, 90/60 at GI visit then elevated when he saw Dr. Elberta Fortis. They did not start the midodrine yet. They were concerned to do so because of the advisement not to take if lying down, and he spends a lot of his days very sedentary. We discussed the intention to be protective on days when he is up and about and they will try taking it on his more active days. He continues to report good days and days. He is fatigue with activity as before. Still struggling with how weak he is compared to how he used to be when he was  younger. Maren Reamer remains present primarily in pedal region but is no longer pitting and generally looks better than before the switch to torsemide. He'll be re-enrolling back in PT soon. He does report some variable leg cramping at times, none today.   Labwork independently reviewed: 01/2023 Hgb 14.1, plt  165, Na 134, Cr 0.99, LFTs wnl  ROS: .    Please see the history of present illness. Otherwise, review of systems is positive for rare fleeting CP at night, no anginal type CP.  All other systems are reviewed and otherwise negative.   Studies Reviewed: Marland Kitchen    EKG:  EKG is not ordered today  CV Studies: Cardiac studies reviewed are outlined and summarized above. Otherwise please see EMR for full report.  Physical Exam:    VS:  BP 112/70   Pulse 61   Ht 5\' 8"  (1.727 m)   Wt 246 lb 6.4 oz (111.8 kg)   SpO2 94%   BMI 37.46 kg/m    Wt Readings from Last 3 Encounters:  01/24/23 246 lb 6.4 oz (111.8 kg)  01/12/23 249 lb (112.9 kg)  12/20/22 248 lb 9.6 oz (112.8 kg)    GEN: Well nourished, well developed in no acute distress NECK: No JVD; No carotid bruits CARDIAC: RRR, no murmurs, rubs, gallops RESPIRATORY:  Clear to auscultation without rales, wheezing or rhonchi  ABDOMEN: Soft, non-tender, non-distended EXTREMITIES:  Mild BLE edema with venous stasis hyperpigmentation towards feet, nonpitting, main puffiness is in top of feet; No acute deformity. Excellent pedal pulses  Asessement and Plan:.    1. Chronic combined CHF/presumed NICM with medical therapy limited by h/o orthostasis - appears clinically stable. I would continue torsemide at present dose. He reports some leg cramping at times. He has excellent pedal pulses. We'll recheck lytes today to ensure at goal. They did not yet start midodrine. We discussed the intention being a protective measure from position changes. Would encourage taking it on days when he is active or intending to be active. Asked them to notify me if SBP running >140 systolic regularly on this medicine. Not a good candidate for SGLT2i with prior UTI, orthostasis.  2. Minimal CAD with chronic chest pain - not on ASA due to SDH. Chest pain is not a major complaint today. No anginal symptoms. LDL at goal 03/2022 on rosuvastatin 3x/week. Recheck lipids/LFTs  03/2023.  3. PVCs - quiescent by symptoms. Continue low dose metoprolol as we are able.   4. Moderate AI, mild MR, mild dilation of aorta - continue clinical surveillance for now. Consider recheck echo 09/2023; would revisit timing in follow-up.    Disposition: F/u with me in 3 months, interval which has been requested historically by family.  Signed, Laurann Montana, PA-C

## 2023-01-24 ENCOUNTER — Ambulatory Visit: Payer: Medicare Other | Attending: Physician Assistant | Admitting: Physician Assistant

## 2023-01-24 ENCOUNTER — Encounter: Payer: Self-pay | Admitting: Physician Assistant

## 2023-01-24 VITALS — BP 112/70 | HR 61 | Ht 68.0 in | Wt 246.4 lb

## 2023-01-24 DIAGNOSIS — I340 Nonrheumatic mitral (valve) insufficiency: Secondary | ICD-10-CM | POA: Insufficient documentation

## 2023-01-24 DIAGNOSIS — I493 Ventricular premature depolarization: Secondary | ICD-10-CM | POA: Insufficient documentation

## 2023-01-24 DIAGNOSIS — I428 Other cardiomyopathies: Secondary | ICD-10-CM | POA: Diagnosis not present

## 2023-01-24 DIAGNOSIS — I951 Orthostatic hypotension: Secondary | ICD-10-CM | POA: Diagnosis not present

## 2023-01-24 DIAGNOSIS — I351 Nonrheumatic aortic (valve) insufficiency: Secondary | ICD-10-CM | POA: Insufficient documentation

## 2023-01-24 DIAGNOSIS — I77810 Thoracic aortic ectasia: Secondary | ICD-10-CM | POA: Diagnosis not present

## 2023-01-24 DIAGNOSIS — R079 Chest pain, unspecified: Secondary | ICD-10-CM | POA: Insufficient documentation

## 2023-01-24 DIAGNOSIS — I5042 Chronic combined systolic (congestive) and diastolic (congestive) heart failure: Secondary | ICD-10-CM | POA: Insufficient documentation

## 2023-01-24 LAB — BASIC METABOLIC PANEL
BUN/Creatinine Ratio: 17 (ref 10–24)
BUN: 16 mg/dL (ref 8–27)
CO2: 28 mmol/L (ref 20–29)
Calcium: 9.9 mg/dL (ref 8.6–10.2)
Chloride: 102 mmol/L (ref 96–106)
Creatinine, Ser: 0.93 mg/dL (ref 0.76–1.27)
Glucose: 101 mg/dL — ABNORMAL HIGH (ref 70–99)
Potassium: 4 mmol/L (ref 3.5–5.2)
Sodium: 138 mmol/L (ref 134–144)
eGFR: 84 mL/min/{1.73_m2} (ref >59)

## 2023-01-24 LAB — MAGNESIUM: Magnesium: 2.1 mg/dL (ref 1.6–2.3)

## 2023-01-24 NOTE — Patient Instructions (Signed)
Medication Instructions:  Your physician recommends that you continue on your current medications as directed. Please refer to the Current Medication list given to you today.  *If you need a refill on your cardiac medications before your next appointment, please call your pharmacy*   Lab Work: TODAY:  BMET & MAG  03/24/23:  Come to the office, FASTING, anytime after 7:15 a.m, before 4:45 for:  LIPID / LFT   If you have labs (blood work) drawn today and your tests are completely normal, you will receive your results only by: MyChart Message (if you have MyChart) OR A paper copy in the mail If you have any lab test that is abnormal or we need to change your treatment, we will call you to review the results.   Testing/Procedures: None ordered   Follow-Up: At [facility], you and your health needs are our priority.  As part of our continuing mission to provide you with exceptional heart care, we have created designated Provider Care Teams.  These Care Teams include your primary Cardiologist (physician) and Advanced Practice Providers (APPs -  Physician Assistants and Nurse Practitioners) who all work together to provide you with the care you need, when you need it.  We recommend signing up for the patient portal called "MyChart".  Sign up information is provided on this After Visit Summary.  MyChart is used to connect with patients for Virtual Visits (Telemedicine).  Patients are able to view lab/test results, encounter notes, upcoming appointments, etc.  Non-urgent messages can be sent to your provider as well.   To learn more about what you can do with MyChart, go to [email or web address].    Your next appointment:   6 month(s)  Provider:   Ronie Spies, PA-C         Other Instructions

## 2023-02-07 DIAGNOSIS — K117 Disturbances of salivary secretion: Secondary | ICD-10-CM | POA: Diagnosis not present

## 2023-02-09 DIAGNOSIS — H10501 Unspecified blepharoconjunctivitis, right eye: Secondary | ICD-10-CM | POA: Diagnosis not present

## 2023-02-09 DIAGNOSIS — H1131 Conjunctival hemorrhage, right eye: Secondary | ICD-10-CM | POA: Diagnosis not present

## 2023-02-13 DIAGNOSIS — M79671 Pain in right foot: Secondary | ICD-10-CM | POA: Diagnosis not present

## 2023-02-13 DIAGNOSIS — B351 Tinea unguium: Secondary | ICD-10-CM | POA: Diagnosis not present

## 2023-02-13 DIAGNOSIS — M79672 Pain in left foot: Secondary | ICD-10-CM | POA: Diagnosis not present

## 2023-02-20 ENCOUNTER — Other Ambulatory Visit: Payer: Self-pay

## 2023-02-20 ENCOUNTER — Encounter: Payer: Self-pay | Admitting: Physical Therapy

## 2023-02-20 ENCOUNTER — Ambulatory Visit: Payer: Medicare Other | Attending: Internal Medicine | Admitting: Physical Therapy

## 2023-02-20 DIAGNOSIS — M6281 Muscle weakness (generalized): Secondary | ICD-10-CM | POA: Insufficient documentation

## 2023-02-20 DIAGNOSIS — R2681 Unsteadiness on feet: Secondary | ICD-10-CM | POA: Diagnosis not present

## 2023-02-20 DIAGNOSIS — R2689 Other abnormalities of gait and mobility: Secondary | ICD-10-CM | POA: Diagnosis not present

## 2023-02-20 DIAGNOSIS — R5381 Other malaise: Secondary | ICD-10-CM | POA: Diagnosis not present

## 2023-02-20 NOTE — Therapy (Signed)
OUTPATIENT PHYSICAL THERAPY NEURO EVALUATION   Patient Name: Jeff Weaver MRN: [id number] DOB:28-Jun-1946, 77 y.o., male Today's Date: 02/20/2023   PCP: Pincus Sanes, MD REFERRING PROVIDER: Pincus Sanes, MD  END OF SESSION:  PT End of Session - 02/20/23 1616     Visit Number 1    Number of Visits 25    Date for PT Re-Evaluation 05/15/23    Authorization Type MCR/[employer]    Authorization Time Period 02/20/23 to 05/15/23    Progress Note Due on Visit 10    PT Start Time 1516    PT Stop Time 1600    PT Time Calculation (min) 44 min    Activity Tolerance Patient tolerated treatment well    Behavior During Therapy [facility] for tasks assessed/performed             Past Medical History:  Diagnosis Date   Aortic insufficiency    Atypical chest pain    Cardiac resynchronization therapy pacemaker (CRT-P) in place    Cerebral aneurysm    a. s/p clipping at [facility] in 8/13 c/b short-term memory loss, cerebral hemorrhage and seizure disorder s/p VP shunt.   Cerebral hemorrhage (HCC)    Chronic combined systolic and diastolic CHF (congestive heart failure) (HCC)    CVA (cerebral infarction)    Diverticulosis    Enteritis (regional)    Dr Juanda Chance   First degree AV block    GI bleed    HTN (hypertension)    Hyperlipidemia    Hypertensive heart disease    IBS (irritable bowel syndrome)    Lewy body dementia (HCC)    Mild CAD    Cardiac cath 01/12/18 showed minimal nonobstructive CAD, normal LVEF, normal LVEDP.   Mild dilation of ascending aorta (HCC)    Mitral regurgitation    NSVT (nonsustained ventricular tachycardia) (HCC)    Orthostatic hypotension    Pacemaker    Pre-diabetes    PVC's (premature ventricular contractions)    Regional enteritis of large intestine (HCC) since 1978   Rheumatoid arthritis(714.0)    dxed in [employer] in 1980s   Second degree AV block, Mobitz type I    Seizures (HCC)    last April 26,2021   Sinus bradycardia    Sleep apnea    SSS (sick sinus  syndrome) (HCC)    Subdural hematoma (HCC)    Past Surgical History:  Procedure Laterality Date   BIV PACEMAKER INSERTION CRT-P N/A 03/10/2021   Procedure: BIV PACEMAKER INSERTION CRT-P UPGRADE;  Surgeon: Regan Lemming, MD;  Location: [facility] INVASIVE CV LAB;  Service: Cardiovascular;  Laterality: N/A;   CHOLECYSTECTOMY N/A 01/07/2016   Procedure: LAPAROSCOPIC CHOLECYSTECTOMY ;  Surgeon: Abigail Miyamoto, MD;  Location: [facility] OR;  Service: General;  Laterality: N/A;   cns shunt  02/23/12   COLONOSCOPY W/ POLYPECTOMY  1978   negative 2009, Dr Juanda Chance. Due 2014   CRANIOTOMY  02/02/12   Dr Kelby Aline, [facility]-clipping of aneurysm   CRANIOTOMY  02-02-12   left pterional craniotomy for clipping complex anterior communicating artery aneurysm    HERNIA REPAIR     LEFT HEART CATH AND CORONARY ANGIOGRAPHY N/A 01/12/2018   Procedure: LEFT HEART CATH AND CORONARY ANGIOGRAPHY;  Surgeon: Swaziland, Peter M, MD;  Location: [facility] INVASIVE CV LAB;  Service: Cardiovascular;  Laterality: N/A;   LEFT HEART CATHETERIZATION WITH CORONARY ANGIOGRAM N/A 11/26/2013   Procedure: LEFT HEART CATHETERIZATION WITH CORONARY ANGIOGRAM;  Surgeon: Marykay Lex, MD;  Location: [facility] CATH LAB;  Service:  Cardiovascular;  Laterality: N/A;   NOSE SURGERY     PACEMAKER IMPLANT N/A 07/02/2018   Procedure: PACEMAKER IMPLANT;  Surgeon: Regan Lemming, MD;  Location: [facility] INVASIVE CV LAB;  Service: Cardiovascular;  Laterality: N/A;   SHOULDER SURGERY  1997   TONSILLECTOMY AND ADENOIDECTOMY     VENTRICULOPERITONEAL SHUNT  02-23-12   INSERTION OF RIGHT FRONTAL VENTRICULOPERITONEAL SHUNT WITH A CODMAN HAKIM PROGRAMMABLE VALVE   Patient Active Problem List   Diagnosis Date Noted   Physical deconditioning 12/20/2022   Poor balance 12/20/2022   PVC's (premature ventricular contractions) 09/19/2022   Diplopia 09/19/2022   Parkinsonism 06/19/2022   Primary localized osteoarthrosis of shoulder region 05/10/2022   Recurrent dislocation of shoulder joint  05/10/2022   Regional enteritis (HCC) 05/10/2022   Vestibular hypofunction of left ear 05/10/2022   Sensorineural hearing loss 05/10/2022   Senile osteoporosis 02/03/2022   Pain in right hip 01/05/2022   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   Intertrigo 10/01/2021   HFrEF (heart failure with reduced ejection fraction) (HCC) 09/18/2021   Blepharitis of upper and lower eyelids of both eyes 09/18/2021   Tinea cruris 09/18/2021   Lewy body dementia (HCC) 09/16/2021   Closed fracture of right hip (HCC) 09/16/2021   S/P hip replacement, right  07/2021 09/16/2021   Presence of right artificial hip joint 08/06/2021   Vascular dementia (HCC) 04/29/2021   Cough 04/09/2021   Vitamin D deficiency 04/09/2021   Vitamin B1 deficiency 04/09/2021   Right arm pain 01/06/2021   Seizures (HCC)    Muscle cramping 12/30/2019   Dizziness 12/30/2019   Unsteady gait 12/30/2019   Pacemaker 11/27/2019   Fatigue 07/09/2019   SSS (sick sinus syndrome) (HCC) 07/02/2018   Bilateral shoulder pain 06/14/2018   Upper airway cough syndrome vs cough variant asthma 03/20/2018   Acute midline low back pain without sciatica 12/25/2017   Depression 10/17/2017   CAD (coronary artery disease), hx of NSTEMI 08/15/2017   Chest pain 07/17/2017   SOBOE (shortness of breath on exertion) 06/08/2017   Bilateral sensorineural hearing loss 06/01/2017   Nasal turbinate hypertrophy 06/01/2017   Dysphagia 05/31/2017   Osteoporosis 01/30/2017   History of cerebral hemorrhage 12/28/2016   Seborrheic dermatitis 07/25/2016   Cognitive communication deficit 06/28/2016   Hypertensive heart disease 06/28/2016   Vitamin B 12 deficiency 03/16/2016   Confusion 03/13/2016   VP (ventriculoperitoneal) shunt status 02/12/2016   Orthostatic hypotension 01/05/2016   Benign prostatic hyperplasia with urinary obstruction 08/04/2014   Urinary urgency 08/04/2014   Gastroesophageal reflux disease 07/14/2014   Polymyalgia rheumatica (HCC) 05/28/2014    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/2014   SBE (subacute bacterial endocarditis) prophylaxis candidate 11/24/2013   Aphasia 07/17/2013   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 seizures, not intractable, without status epilepticus (HCC) 07/17/2013   Subdural hemorrhage (HCC) 07/17/2013   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HCC) 07/17/2013   Pre-diabetes 03/26/2013   Urinary frequency 10/31/2012   Nocturia 10/31/2012   History of cerebral aneurysm repair 10/18/2012   History of stroke without residual deficits 10/18/2012   Ocular rosacea 09/27/2012   Seizure disorder (HCC) 07/23/2012   Entropion 07/10/2012   Hyperopia with astigmatism 07/10/2012   Combined senile cataract 04/13/2012   Presbyopia 04/13/2012   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) 03/01/2012   Cognitive safety issue 02/20/2012   Nonruptured cerebral aneurysm 11/23/2011   Obstructive sleep apnea syndrome 08/09/2011   Hyperlipidemia 02/03/2009   HTN (hypertension) 02/03/2009   History of Crohn's disease 04/17/2008  Allergic rhinitis, unspecified 07/05/1999   Cataract, right eye 07/05/1999    ONSET DATE: chronic   REFERRING DIAG: R26.89 (ICD-10-CM) - Poor balance R53.81 (ICD-10-CM) - Physical deconditioning  THERAPY DIAG:  Muscle weakness (generalized)  Unsteadiness on feet  Other abnormalities of gait and mobility  Rationale for Evaluation and Treatment: Rehabilitation  SUBJECTIVE:                                                                                                                                                                                             SUBJECTIVE STATEMENT:  Before having the fall and SDH (over a year ago), he was doing OK. The fall and SDH put him in and out of the ICU, was in the hospital for about a month and then went to a SNF for about a month after that. Balance, strength, endurance are priorities.  Having a lot of vision problems,  have been waiting over a year for appt with a neuro-opthomologist. Has orthostatic hypotension sometimes, on and off. Neurologist would like him to get a U-walker.  "I'm not that complex, I just can't do anything for a year and a half now."  Pt accompanied by: significant other  PERTINENT HISTORY: Aortic insufficiency, CRT-P pacemaker placement, cerebral aneurysm s/p clipping, cerebral hemorrhage and seizure disorder s/p VP shunt, CHF, CVA, second degree AV block Mobitz type 1 s/p PPM, HTN, HLD, hypertensive heart disease, IBS, lew body dementia, mitral regurgitation, NSVT, orthostatic hypotension, RA, SSS, subdural hematoma, hernia repair, cardiac cath  PAIN:  Are you having pain? Yes: NPRS scale: "chronic back pain, it usually"/10 Pain location: R low back  Pain description: discomfort, some radicular pains down legs especially in the mornings  Aggravating factors: standing up, sitting unsupported Relieving factors: movement  PRECAUTIONS: Fall and ICD/Pacemaker, poor vision, STM impairments  RED FLAGS: None   WEIGHT BEARING RESTRICTIONS: No  FALLS: Has patient fallen in last 6 months? Yes. Number of falls 2 in the past 6 months, had additional falls >6 months ago; per spouse, falls a lot when bending to pick something up off the floor, fall with SDH and hip fracture in 2023 was due to slipping on wet grass outside   LIVING ENVIRONMENT: Lives with: lives with their family Lives in: House/apartment Stairs: Yes: Internal: 14 steps; one step to enter home  Has following equipment at home: Single point cane and "semi-hospital bed that adjusts the head, walker with 2 wheels, 4WW and WC, shower bench"   PLOF: Independent, Independent with basic ADLs, Requires assistive device for independence, and handheld shower head; per spouse "house is older and difficult to renovate,  may try to convert bathroom to a shower stall  PATIENT GOALS: improve strength, balance, endurance   OBJECTIVE:      COGNITION: Overall cognitive status: History of cognitive impairments - at baseline       POSTURE: rounded shoulders, forward head, decreased lumbar lordosis, and increased thoracic kyphosis, flexed at hips     LOWER EXTREMITY MMT:    MMT Right Eval Left Eval  Hip flexion 3 3  Hip extension    Hip abduction 3 seated  3 seated  Hip adduction    Hip internal rotation    Hip external rotation    Knee flexion    Knee extension 4 4  Ankle dorsiflexion 3+ 3+  Ankle plantarflexion    Ankle inversion    Ankle eversion    (Blank rows = not tested)    TRANSFERS: Assistive device utilized: Single point cane  Sit to stand: SBA Stand to sit: SBA Chair to chair: SBA      GAIT: Gait pattern: step through pattern, decreased arm swing- Right, decreased arm swing- Left, decreased step length- Right, decreased step length- Left, decreased stance time- Right, decreased stance time- Left, decreased stride length, decreased ankle dorsiflexion- Right, decreased ankle dorsiflexion- Left, Right foot flat, Left foot flat, shuffling, trendelenburg, decreased trunk rotation, trunk flexed, and wide BOS Distance walked: in clinic distances  Assistive device utilized: Single point cane Level of assistance: SBA Comments: slow and steady with SPC   FUNCTIONAL TESTS:  5 times sit to stand: 58 seconds use of UEs on chair  Timed up and go (TUG): 44 seconds, SPC  Gait speed 0.14 m/s with SPC   PATIENT SURVEYS:  FOTO next visit   TODAY'S TREATMENT:                                                                                                                              DATE:   Eval  Objective measures, care planning    PATIENT EDUCATION: Education details: exam findings, POC, offered SLP and OT skilled services  Person educated: Patient and Spouse Education method: Explanation Education comprehension: verbalized understanding and needs further education  HOME EXERCISE  PROGRAM: TBD   GOALS: Goals reviewed with patient? Yes  SHORT TERM GOALS: Target date: 04/03/2023    Will be compliant with appropriate progressive HEP with no more than MinA from family  Baseline: Goal status: INITIAL  2.  Will improve gait speed to at least 0.32m/s with LRAD  Baseline:  Goal status: INITIAL  3.  Will complete TUG test in 30 seconds or better with LRAD  Baseline:  Goal status: INITIAL  4.  Will complete 5xSTS test in 45 seconds or less with UEs  Baseline:  Goal status: INITIAL  5.  Will tolerate walking and/or activity in PT for at least 10 minutes without needing a rest break  Baseline:  Goal status: INITIAL    LONG TERM GOALS: Target date: 05/15/2023  MMT to improve by one grade in all weak groups  Baseline:  Goal status: INITIAL  2.  Will be able to ambulate at least 330ft in (0.69m/s) with LRAD to show improved community access no rest breaks   Baseline:  Goal status: INITIAL  3.  Will score at least 42 on the Berg to show reduced fall risk  Baseline:  Goal status: INITIAL  4.  Will complete 5xSTS test in 20 seconds or less with UEs to show improved functional mobility  Baseline:  Goal status: INITIAL  5.  Will be compliant with appropriate progressive long term exercise plan with no more than MinA from family to combat sedentary lifestyle and maintain functional gains from PT  Baseline:  Goal status: INITIAL    ASSESSMENT:  CLINICAL IMPRESSION: Patient is a 77 y.o. M who was seen today for physical therapy evaluation and treatment for poor balance and physical deconditioning. 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 He does seem to have a very supportive family. Exam shows diffuse functional muscle weakness, poor functional balance skills,  impaired functional endurance, and impaired safety awareness and mobility as a  whole. Will benefit from skilled PT services to address all concerns, will also benefit from working with skilled OT and SLP services at this multidiscipilinary clinic- will reach out to referring MD to facilitate order placement.   OBJECTIVE IMPAIRMENTS: Abnormal gait, decreased activity tolerance, decreased balance, decreased cognition, decreased coordination, decreased knowledge of use of DME, decreased mobility, difficulty walking, decreased strength, decreased safety awareness, postural dysfunction, and obesity.   ACTIVITY LIMITATIONS: lifting, bending, standing, squatting, stairs, transfers, bed mobility, and locomotion level  PARTICIPATION LIMITATIONS: meal prep, cleaning, shopping, community activity, yard work, and church  PERSONAL FACTORS: Age, Fitness, Past/current experiences, Social background, and Time since onset of injury/illness/exacerbation are also affecting patient's functional outcome.   REHAB POTENTIAL: Good  CLINICAL DECISION MAKING: Evolving/moderate complexity  EVALUATION COMPLEXITY: Moderate  PLAN:  PT FREQUENCY: 2x/week  PT DURATION: 12 weeks  PLANNED INTERVENTIONS: Therapeutic exercises, Therapeutic activity, Neuromuscular re-education, Balance training, Gait training, Patient/Family education, Self Care, Stair training, DME instructions, Aquatic Therapy, Dry Needling, Cognitive remediation, Cryotherapy, Moist heat, Manual therapy, and Re-evaluation  PLAN FOR NEXT SESSION: any word back from Dr. Lawerance Bach about OT and speech? Work on strength, balance, endurance as tolerated. Still needs HEP    Nedra Hai, PT, DPT 02/20/23 4:17 PM

## 2023-02-21 ENCOUNTER — Encounter: Payer: Self-pay | Admitting: Physical Therapy

## 2023-02-21 ENCOUNTER — Other Ambulatory Visit: Payer: Self-pay | Admitting: Internal Medicine

## 2023-02-21 DIAGNOSIS — R2689 Other abnormalities of gait and mobility: Secondary | ICD-10-CM

## 2023-02-21 DIAGNOSIS — I63512 Cerebral infarction due to unspecified occlusion or stenosis of left middle cerebral artery: Secondary | ICD-10-CM

## 2023-02-21 DIAGNOSIS — R41841 Cognitive communication deficit: Secondary | ICD-10-CM

## 2023-02-21 DIAGNOSIS — R5381 Other malaise: Secondary | ICD-10-CM

## 2023-02-21 DIAGNOSIS — F0280 Dementia in other diseases classified elsewhere without behavioral disturbance: Secondary | ICD-10-CM

## 2023-02-21 NOTE — Therapy (Signed)
Sent message to referring MD Cheryll Cockayne) about referrals for skilled OT and Speech therapy orders for [facility] re[facility], awaiting response.   Nedra Hai, PT, DPT 02/21/23 11:33 AM

## 2023-02-23 ENCOUNTER — Ambulatory Visit: Payer: Medicare Other | Admitting: Physical Therapy

## 2023-02-23 ENCOUNTER — Encounter: Payer: Self-pay | Admitting: Physical Therapy

## 2023-02-23 DIAGNOSIS — M6281 Muscle weakness (generalized): Secondary | ICD-10-CM | POA: Diagnosis not present

## 2023-02-23 DIAGNOSIS — R2681 Unsteadiness on feet: Secondary | ICD-10-CM | POA: Diagnosis not present

## 2023-02-23 DIAGNOSIS — R2689 Other abnormalities of gait and mobility: Secondary | ICD-10-CM | POA: Diagnosis not present

## 2023-02-23 DIAGNOSIS — R5381 Other malaise: Secondary | ICD-10-CM | POA: Diagnosis not present

## 2023-02-23 NOTE — Therapy (Signed)
OUTPATIENT PHYSICAL THERAPY NEURO TREATMENT   Patient Name: Jeff Weaver MRN: [id number] DOB:Jul 16, 1945, 77 y.o., male Today's Date: 02/23/2023   PCP: Pincus Sanes, MD REFERRING PROVIDER: Pincus Sanes, MD  END OF SESSION:  PT End of Session - 02/23/23 1025     Visit Number 2    Number of Visits 25    Date for PT Re-Evaluation 05/15/23    Authorization Type MCR/T[employer]    Authorization Time Period 02/20/23 to 05/15/23    Progress Note Due on Visit 10    PT Start Time 1025   pt arrives late   PT Stop Time 1100    PT Time Calculation (min) 35 min    Activity Tolerance Patient tolerated treatment well    Behavior During Therapy [facility] for tasks assessed/performed              Past Medical History:  Diagnosis Date   Aortic insufficiency    Atypical chest pain    Cardiac resynchronization therapy pacemaker (CRT-P) in place    Cerebral aneurysm    a. s/p clipping at [facility] in 8/13 c/b short-term memory loss, cerebral hemorrhage and seizure disorder s/p VP shunt.   Cerebral hemorrhage (HCC)    Chronic combined systolic and diastolic CHF (congestive heart failure) (HCC)    CVA (cerebral infarction)    Diverticulosis    Enteritis (regional)    Dr Juanda Chance   First degree AV block    GI bleed    HTN (hypertension)    Hyperlipidemia    Hypertensive heart disease    IBS (irritable bowel syndrome)    Lewy body dementia (HCC)    Mild CAD    Cardiac cath 01/12/18 showed minimal nonobstructive CAD, normal LVEF, normal LVEDP.   Mild dilation of ascending aorta (HCC)    Mitral regurgitation    NSVT (nonsustained ventricular tachycardia) (HCC)    Orthostatic hypotension    Pacemaker    Pre-diabetes    PVC's (premature ventricular contractions)    Regional enteritis of large intestine (HCC) since 1978   Rheumatoid arthritis(714.0)    dxed in [employer] in 1980s   Second degree AV block, Mobitz type I    Seizures (HCC)    last April 26,2021   Sinus bradycardia    Sleep apnea     SSS (sick sinus syndrome) (HCC)    Subdural hematoma (HCC)    Past Surgical History:  Procedure Laterality Date   BIV PACEMAKER INSERTION CRT-P N/A 03/10/2021   Procedure: BIV PACEMAKER INSERTION CRT-P UPGRADE;  Surgeon: Regan Lemming, MD;  Location: [facility] INVASIVE CV LAB;  Service: Cardiovascular;  Laterality: N/A;   CHOLECYSTECTOMY N/A 01/07/2016   Procedure: LAPAROSCOPIC CHOLECYSTECTOMY ;  Surgeon: Abigail Miyamoto, MD;  Location: [facility] OR;  Service: General;  Laterality: N/A;   cns shunt  02/23/12   COLONOSCOPY W/ POLYPECTOMY  1978   negative 2009, Dr Juanda Chance. Due 2014   CRANIOTOMY  02/02/12   Dr Kelby Aline, [facility]-clipping of aneurysm   CRANIOTOMY  02-02-12   left pterional craniotomy for clipping complex anterior communicating artery aneurysm    HERNIA REPAIR     LEFT HEART CATH AND CORONARY ANGIOGRAPHY N/A 01/12/2018   Procedure: LEFT HEART CATH AND CORONARY ANGIOGRAPHY;  Surgeon: Swaziland, Peter M, MD;  Location: [facility] INVASIVE CV LAB;  Service: Cardiovascular;  Laterality: N/A;   LEFT HEART CATHETERIZATION WITH CORONARY ANGIOGRAM N/A 11/26/2013   Procedure: LEFT HEART CATHETERIZATION WITH CORONARY ANGIOGRAM;  Surgeon: Marykay Lex, MD;  Location:  [facility] CATH LAB;  Service: Cardiovascular;  Laterality: N/A;   NOSE SURGERY     PACEMAKER IMPLANT N/A 07/02/2018   Procedure: PACEMAKER IMPLANT;  Surgeon: Regan Lemming, MD;  Location: [facility] INVASIVE CV LAB;  Service: Cardiovascular;  Laterality: N/A;   SHOULDER SURGERY  1997   TONSILLECTOMY AND ADENOIDECTOMY     VENTRICULOPERITONEAL SHUNT  02-23-12   INSERTION OF RIGHT FRONTAL VENTRICULOPERITONEAL SHUNT WITH A CODMAN HAKIM PROGRAMMABLE VALVE   Patient Active Problem List   Diagnosis Date Noted   Physical deconditioning 12/20/2022   Poor balance 12/20/2022   PVC's (premature ventricular contractions) 09/19/2022   Diplopia 09/19/2022   Parkinsonism 06/19/2022   Primary localized osteoarthrosis of shoulder region 05/10/2022   Recurrent  dislocation of shoulder joint 05/10/2022   Regional enteritis (HCC) 05/10/2022   Vestibular hypofunction of left ear 05/10/2022   Sensorineural hearing loss 05/10/2022   Senile osteoporosis 02/03/2022   Pain in right hip 01/05/2022   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   Intertrigo 10/01/2021   HFrEF (heart failure with reduced ejection fraction) (HCC) 09/18/2021   Blepharitis of upper and lower eyelids of both eyes 09/18/2021   Tinea cruris 09/18/2021   Lewy body dementia (HCC) 09/16/2021   Closed fracture of right hip (HCC) 09/16/2021   S/P hip replacement, right  07/2021 09/16/2021   Presence of right artificial hip joint 08/06/2021   Vascular dementia (HCC) 04/29/2021   Cough 04/09/2021   Vitamin D deficiency 04/09/2021   Vitamin B1 deficiency 04/09/2021   Right arm pain 01/06/2021   Seizures (HCC)    Muscle cramping 12/30/2019   Dizziness 12/30/2019   Unsteady gait 12/30/2019   Pacemaker 11/27/2019   Fatigue 07/09/2019   SSS (sick sinus syndrome) (HCC) 07/02/2018   Bilateral shoulder pain 06/14/2018   Upper airway cough syndrome vs cough variant asthma 03/20/2018   Acute midline low back pain without sciatica 12/25/2017   Depression 10/17/2017   CAD (coronary artery disease), hx of NSTEMI 08/15/2017   Chest pain 07/17/2017   SOBOE (shortness of breath on exertion) 06/08/2017   Bilateral sensorineural hearing loss 06/01/2017   Nasal turbinate hypertrophy 06/01/2017   Dysphagia 05/31/2017   Osteoporosis 01/30/2017   History of cerebral hemorrhage 12/28/2016   Seborrheic dermatitis 07/25/2016   Cognitive communication deficit 06/28/2016   Hypertensive heart disease 06/28/2016   Vitamin B 12 deficiency 03/16/2016   Confusion 03/13/2016   VP (ventriculoperitoneal) shunt status 02/12/2016   Orthostatic hypotension 01/05/2016   Benign prostatic hyperplasia with urinary obstruction 08/04/2014   Urinary urgency 08/04/2014   Gastroesophageal reflux disease 07/14/2014    Polymyalgia rheumatica (HCC) 05/28/2014   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/2014   SBE (subacute bacterial endocarditis) prophylaxis candidate 11/24/2013   Aphasia 07/17/2013   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 seizures, not intractable, without status epilepticus (HCC) 07/17/2013   Subdural hemorrhage (HCC) 07/17/2013   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HCC) 07/17/2013   Pre-diabetes 03/26/2013   Urinary frequency 10/31/2012   Nocturia 10/31/2012   History of cerebral aneurysm repair 10/18/2012   History of stroke without residual deficits 10/18/2012   Ocular rosacea 09/27/2012   Seizure disorder (HCC) 07/23/2012   Entropion 07/10/2012   Hyperopia with astigmatism 07/10/2012   Combined senile cataract 04/13/2012   Presbyopia 04/13/2012   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) 03/01/2012   Cognitive safety issue 02/20/2012   Nonruptured cerebral aneurysm 11/23/2011   Obstructive sleep apnea syndrome 08/09/2011   Hyperlipidemia 02/03/2009   HTN (hypertension) 02/03/2009  History of Crohn's disease 04/17/2008   Allergic rhinitis, unspecified 07/05/1999   Cataract, right eye 07/05/1999    ONSET DATE: chronic   REFERRING DIAG: R26.89 (ICD-10-CM) - Poor balance R53.81 (ICD-10-CM) - Physical deconditioning  THERAPY DIAG:  Muscle weakness (generalized)  Unsteadiness on feet  Other abnormalities of gait and mobility  Rationale for Evaluation and Treatment: Rehabilitation  SUBJECTIVE:                                                                                                                                                                                             SUBJECTIVE STATEMENT: "Just in terrible shape for an old man".  I've never done these machines, so I'd just like to try them slowly.  Reports spending about 90% of his day in his recliner.   Pt accompanied by: significant other  PERTINENT HISTORY: Aortic  insufficiency, CRT-P pacemaker placement, cerebral aneurysm s/p clipping, cerebral hemorrhage and seizure disorder s/p VP shunt, CHF, CVA, second degree AV block Mobitz type 1 s/p PPM, HTN, HLD, hypertensive heart disease, IBS, lew body dementia, mitral regurgitation, NSVT, orthostatic hypotension, RA, SSS, subdural hematoma, hernia repair, cardiac cath  PAIN:  Are you having pain? Yes: NPRS scale: "chronic back" 6/10 Pain location: R low back  Pain description: discomfort, some radicular pains down legs especially in the mornings  Aggravating factors: standing up, sitting unsupported Relieving factors: movement  PRECAUTIONS: Fall and ICD/Pacemaker, poor vision, STM impairments  RED FLAGS: None   WEIGHT BEARING RESTRICTIONS: No  FALLS: Has patient fallen in last 6 months? Yes. Number of falls 2 in the past 6 months, had additional falls >6 months ago; per spouse, falls a lot when bending to pick something up off the floor, fall with SDH and hip fracture in 2023 was due to slipping on wet grass outside   LIVING ENVIRONMENT: Lives with: lives with their family Lives in: House/apartment Stairs: Yes: Internal: 14 steps; one step to enter home  Has following equipment at home: Single point cane and "semi-hospital bed that adjusts the head, walker with 2 wheels, 4WW and WC, shower bench"   PLOF: Independent, Independent with basic ADLs, Requires assistive device for independence, and handheld shower head; per spouse "house is older and difficult to renovate, may try to convert bathroom to a shower stall  PATIENT GOALS: improve strength, balance, endurance   OBJECTIVE:    TODAY'S TREATMENT: 02/23/2023 Activity Comments  NuStep, Level 2, 4 extremities x 6 minutes SPM 27-35, no c/o pain with use of NuStep  Seated hamstring stretch, 3 x 30 sec Foot propped on 4" step, c/o discomfort at abdomen and  in low back  Seated march x 10 LAQ x 10 Ankle pumps x 10 2 sets, 2nd set with 2# weight   Sit<>stand x 3 reps UE support  Gait with cane, 50 ft x 2, 15 ft x 2 with min guard and cues to slow pace Hastening of gait at times, cues provided for heelstrike, but pt not consistent      HOME EXERCISE PROGRAM: Access Code: [email or web address] URL: [email or web address] Date: 02/23/2023 Prepared by: [facility] Out[facility]  Rehab - [facility]  Exercises - Seated March  - 1 x daily - 5 x weekly - 3 sets - 10 reps - Seated Long Arc Quad  - 1 x daily - 5 x weekly - 3 sets - 10 reps - Seated Ankle Dorsiflexion AROM  - 1 x daily - 7 x weekly - 3 sets - 10 reps - 3 sec hold  PATIENT EDUCATION: Education details: HEP initiated; encouraged use of rollator due to pt's hastening with gait with cane Person educated: Patient and Spouse (wife at end of session) Education method: Explanation, Demonstration, Verbal cues, and Handouts Education comprehension: verbalized understanding, returned demonstration, and needs further education  ----------------------------------------------------- Objective measures below taken at initial evaluation:  COGNITION: Overall cognitive status: History of cognitive impairments - at baseline       POSTURE: rounded shoulders, forward head, decreased lumbar lordosis, and increased thoracic kyphosis, flexed at hips     LOWER EXTREMITY MMT:    MMT Right Eval Left Eval  Hip flexion 3 3  Hip extension    Hip abduction 3 seated  3 seated  Hip adduction    Hip internal rotation    Hip external rotation    Knee flexion    Knee extension 4 4  Ankle dorsiflexion 3+ 3+  Ankle plantarflexion    Ankle inversion    Ankle eversion    (Blank rows = not tested)    TRANSFERS: Assistive device utilized: Single point cane  Sit to stand: SBA Stand to sit: SBA Chair to chair: SBA      GAIT: Gait pattern: step through pattern, decreased arm swing- Right, decreased arm swing- Left, decreased step length- Right, decreased step length- Left,  decreased stance time- Right, decreased stance time- Left, decreased stride length, decreased ankle dorsiflexion- Right, decreased ankle dorsiflexion- Left, Right foot flat, Left foot flat, shuffling, trendelenburg, decreased trunk rotation, trunk flexed, and wide BOS Distance walked: in clinic distances  Assistive device utilized: Single point cane Level of assistance: SBA Comments: slow and steady with SPC   FUNCTIONAL TESTS:  5 times sit to stand: 58 seconds use of UEs on chair  Timed up and go (TUG): 44 seconds, SPC  Gait speed 0.14 m/s with SPC   PATIENT SURVEYS:  FOTO next visit   TODAY'S TREATMENT:  DATE:   Eval  Objective measures, care planning    PATIENT EDUCATION: Education details: exam findings, POC, offered SLP and OT skilled services  Person educated: Patient and Spouse Education method: Explanation Education comprehension: verbalized understanding and needs further education  HOME EXERCISE PROGRAM: TBD   GOALS: Goals reviewed with patient? Yes  SHORT TERM GOALS: Target date: 04/03/2023    Will be compliant with appropriate progressive HEP with no more than MinA from family  Baseline: Goal status: IN PROGRESS  2.  Will improve gait speed to at least 0.79m/s with LRAD  Baseline:  Goal status: IN PROGRESS  3.  Will complete TUG test in 30 seconds or better with LRAD  Baseline:  Goal status: IN PROGRESS  4.  Will complete 5xSTS test in 45 seconds or less with UEs  Baseline:  Goal status: IN PROGRESS  5.  Will tolerate walking and/or activity in PT for at least 10 minutes without needing a rest break  Baseline:  Goal status: IN PROGRESS    LONG TERM GOALS: Target date: 05/15/2023    MMT to improve by one grade in all weak groups  Baseline:  Goal status: IN PROGRESS  2.  Will be able to ambulate at least 311ft in  (0.32m/s) with LRAD to show improved community access no rest breaks   Baseline:  Goal status: IN PROGRESS  3.  Will score at least 42 on the Berg to show reduced fall risk  Baseline:  Goal status: IN PROGRESS  4.  Will complete 5xSTS test in 20 seconds or less with UEs to show improved functional mobility  Baseline:  Goal status: IN PROGRESS  5.  Will be compliant with appropriate progressive long term exercise plan with no more than MinA from family to combat sedentary lifestyle and maintain functional gains from PT  Baseline:  Goal status: IN PROGRESS    ASSESSMENT:  CLINICAL IMPRESSION: Pt returns today for first visit after evaluation.  Started session on NuStep for gentle warm up for BLEs, and pt does not have any c/o with this machine, other than he doesn't want to go too fast the first day he's trying it.  After warm up on NuStep, worked on seated lower extremity flexibility and strengthening.  He doesn't tolerate seated hamstring stretch super well, due back pain and body habitus at midsection, but he may do better with continued trials.  Initiated HEP for seated strengthening and will continue to encourage safe increase in activity level at home, as he reports sitting about 90% of his day in the recliner.    OBJECTIVE IMPAIRMENTS: Abnormal gait, decreased activity tolerance, decreased balance, decreased cognition, decreased coordination, decreased knowledge of use of DME, decreased mobility, difficulty walking, decreased strength, decreased safety awareness, postural dysfunction, and obesity.   ACTIVITY LIMITATIONS: lifting, bending, standing, squatting, stairs, transfers, bed mobility, and locomotion level  PARTICIPATION LIMITATIONS: meal prep, cleaning, shopping, community activity, yard work, and church  PERSONAL FACTORS: Age, Fitness, Past/current experiences, Social background, and Time since onset of injury/illness/exacerbation are also affecting patient's functional  outcome.   REHAB POTENTIAL: Good  CLINICAL DECISION MAKING: Evolving/moderate complexity  EVALUATION COMPLEXITY: Moderate  PLAN:  PT FREQUENCY: 2x/week  PT DURATION: 12 weeks  PLANNED INTERVENTIONS: Therapeutic exercises, Therapeutic activity, Neuromuscular re-education, Balance training, Gait training, Patient/Family education, Self Care, Stair training, DME instructions, Aquatic Therapy, Dry Needling, Cognitive remediation, Cryotherapy, Moist heat, Manual therapy, and Re-evaluation  PLAN FOR NEXT SESSION: Review initial HEP and progress as able (try  to add seated hamstring stretch).  Utilize NuStep, Work on strength, balance, endurance as tolerated.     Lonia Blood, PT 02/23/23 12:52 PM Phone: [phone] Fax: [phone]  [facility] at [facility] [address] [place], [address] [place], [place] [address] Phone # [phone] Fax # [phone]

## 2023-02-24 ENCOUNTER — Other Ambulatory Visit: Payer: Medicare Other

## 2023-02-24 ENCOUNTER — Ambulatory Visit (INDEPENDENT_AMBULATORY_CARE_PROVIDER_SITE_OTHER): Payer: Medicare Other

## 2023-02-24 DIAGNOSIS — I428 Other cardiomyopathies: Secondary | ICD-10-CM

## 2023-02-24 LAB — CUP PACEART REMOTE DEVICE CHECK
Battery Remaining Longevity: 111 mo
Battery Voltage: 2.99 V
Brady Statistic AP VP Percent: 90.81 %
Brady Statistic AP VS Percent: 0.01 %
Brady Statistic AS VP Percent: 8.76 %
Brady Statistic AS VS Percent: 0.42 %
Brady Statistic RA Percent Paced: 90.95 %
Brady Statistic RV Percent Paced: 99.56 %
Date Time Interrogation Session: 20240822213740
Implantable Lead Connection Status: 753985
Implantable Lead Connection Status: 753985
Implantable Lead Connection Status: 753985
Implantable Lead Implant Date: 20191230
Implantable Lead Implant Date: 20191230
Implantable Lead Implant Date: 20220907
Implantable Lead Location: 753858
Implantable Lead Location: 753859
Implantable Lead Location: 753860
Implantable Lead Model: 4598
Implantable Lead Model: 5076
Implantable Lead Model: 5076
Implantable Pulse Generator Implant Date: 20220907
Lead Channel Impedance Value: 323 Ohm
Lead Channel Impedance Value: 361 Ohm
Lead Channel Impedance Value: 399 Ohm
Lead Channel Impedance Value: 418 Ohm
Lead Channel Impedance Value: 418 Ohm
Lead Channel Impedance Value: 418 Ohm
Lead Channel Impedance Value: 456 Ohm
Lead Channel Impedance Value: 513 Ohm
Lead Channel Impedance Value: 627 Ohm
Lead Channel Impedance Value: 646 Ohm
Lead Channel Impedance Value: 665 Ohm
Lead Channel Impedance Value: 760 Ohm
Lead Channel Impedance Value: 779 Ohm
Lead Channel Impedance Value: 779 Ohm
Lead Channel Pacing Threshold Amplitude: 0.625 V
Lead Channel Pacing Threshold Amplitude: 0.75 V
Lead Channel Pacing Threshold Amplitude: 1.125 V
Lead Channel Pacing Threshold Pulse Width: 0.4 ms
Lead Channel Pacing Threshold Pulse Width: 0.4 ms
Lead Channel Pacing Threshold Pulse Width: 0.4 ms
Lead Channel Sensing Intrinsic Amplitude: 1.75 mV
Lead Channel Sensing Intrinsic Amplitude: 1.75 mV
Lead Channel Sensing Intrinsic Amplitude: 20.625 mV
Lead Channel Sensing Intrinsic Amplitude: 20.625 mV
Lead Channel Setting Pacing Amplitude: 1.25 V
Lead Channel Setting Pacing Amplitude: 1.5 V
Lead Channel Setting Pacing Amplitude: 2.25 V
Lead Channel Setting Pacing Pulse Width: 0.4 ms
Lead Channel Setting Pacing Pulse Width: 0.4 ms
Lead Channel Setting Sensing Sensitivity: 1.2 mV
Zone Setting Status: 755011
Zone Setting Status: 755011

## 2023-03-01 ENCOUNTER — Ambulatory Visit: Payer: Medicare Other | Admitting: Physical Therapy

## 2023-03-01 NOTE — Progress Notes (Signed)
Remote pacemaker transmission.   

## 2023-03-02 NOTE — Progress Notes (Signed)
Subjective:    Patient ID: Jeff Weaver, male    DOB: 11/10/1945, 77 y.o.   MRN: [id number]      HPI Jeff Weaver is here for  Chief Complaint  Patient presents with   Follow-up    Larey Seat on Saturday at 3 am getting out of bed, tore skin on left , Rt lower back bruising and lt buttock bruising.  Persistent Productive cough and soret hroat  He is here today with his wife who supplements his history.  Cold symptoms - not sure when his symptoms started.  He has not felt well for a while.  He has chronic cough, but this cough is worse.  His daughter and wife had COVID last week.  He was not vocal about some of his symptoms it was difficult for them to say when the symptoms actually started.   Fall at home 8/24 - left forearm skin tear with surrounding erythema with is new.  Large bruise on lower right back, small bruise left-the fall was not witnessed so they are not sure how exactly he fell.  Initially he seemed to be okay, but a couple days later he was having difficulty getting up on his own and his legs seem to be weaker.  Not sure if that was related to COVID or the fall.    Medications and allergies reviewed with patient and updated if appropriate.  Current Outpatient Medications on File Prior to Visit  Medication Sig Dispense Refill   acetaminophen (TYLENOL) 325 MG tablet Take 325-650 mg by mouth every 6 (six) hours as needed (pain/headaches.).     amoxicillin (AMOXIL) 500 MG capsule Take 500 mg by mouth every 8 (eight) hours.     carbidopa-levodopa (SINEMET IR) 25-100 MG tablet as directed.     cetirizine (ZYRTEC) 10 MG tablet Take 1 tablet (10 mg total) by mouth daily. 30 tablet 11   Cholecalciferol (VITAMIN D) 50 MCG (2000 UT) CAPS Take 2,000 Units by mouth in the morning.     donepezil (ARICEPT) 10 MG tablet Take half a tablet in the morning for a month and then increase to 1 full tablet daily. 30 tablet 5   finasteride (PROSCAR) 5 MG tablet Take 5 mg by mouth daily.      ipratropium (ATROVENT) 0.06 % nasal spray Place into the nose as needed for rhinitis.     levETIRAcetam (KEPPRA) 500 MG tablet Take 3 tablets by mouth in the morning and at bedtime.     loperamide (IMODIUM) 2 MG capsule Take 2 mg by mouth as needed.     LORazepam (ATIVAN) 1 MG tablet Take 1 mg by mouth every 6 (six) hours as needed for seizure.      metoprolol succinate (TOPROL-XL) 25 MG 24 hr tablet TAKE 1 TABLET (25 MG TOTAL) BY MOUTH DAILY. 90 tablet 3   Midazolam (NAYZILAM) 5 MG/0.1ML SOLN Place 1 each into the nose as needed (seizure).     midodrine (PROAMATINE) 2.5 MG tablet Take 1 tablet (2.5 mg total) by mouth daily. 270 tablet 3   mirabegron ER (MYRBETRIQ) 50 MG TB24 tablet Take 1 tablet (50 mg total) by mouth daily. 90 tablet 1   nitroGLYCERIN (NITROSTAT) 0.4 MG SL tablet PLACE 1 TABLET (0.4 MG TOTAL) UNDER THE TONGUE EVERY 5 (FIVE) MINUTES AS NEEDED FOR CHEST PAIN. 25 tablet 11   nystatin ointment (MYCOSTATIN) Apply 1 Application topically 2 (two) times daily. 60 g 3   Propylene Glycol (SYSTANE BALANCE) 0.6 % SOLN  Apply 1 drop to eye daily as needed (dry eyes).     pyridOXINE (VITAMIN B-6) 100 MG tablet Take 100 mg by mouth in the morning.     rosuvastatin (CRESTOR) 5 MG tablet Take 5 mg by mouth 3 (three) times a week.     Tamsulosin HCl (FLOMAX) 0.4 MG CAPS Take 0.4 mg by mouth every evening.     thiamine 100 MG tablet Take 100 mg by mouth in the morning.     torsemide (DEMADEX) 20 MG tablet Take 1 tablet (20 mg) by mouth alternating with 1/2 tablet (10 mg) by mouth every other day. 135 tablet 3   triamcinolone cream (KENALOG) 0.5 % Apply 1 Application topically as needed (break out prevention).     VIMPAT 200 MG TABS tablet Take 200 mg by mouth 2 (two) times daily.     vitamin B-12 (CYANOCOBALAMIN) 1000 MCG tablet Take 1,000 mcg by mouth in the morning.     clonazePAM (KLONOPIN) 1 MG disintegrating tablet Take 1 mg by mouth as directed. ALLOW 1 TABLET TO DISSOLVE IN CHEEK FOR  SEIZURES LASTING LONGER THAN 4 MINUTES. OR AFTER 2ND SEIZURE IN 24 HOURS PRN     No current facility-administered medications on file prior to visit.    Review of Systems  Constitutional:  Positive for fatigue. Negative for chills and fever.  HENT:  Positive for congestion, rhinorrhea and sore throat (occ). Negative for sinus pressure and sinus pain.   Respiratory:  Positive for cough (acute on chronic - sounds wet), shortness of breath and wheezing.   Gastrointestinal:  Negative for diarrhea (loose stool).  Musculoskeletal:  Positive for myalgias.  Neurological:  Negative for headaches.       Objective:   Vitals:   03/03/23 0900  BP: 112/60  Pulse: 60  Temp: 99.4 F (37.4 C)  SpO2: 94%   BP Readings from Last 3 Encounters:  03/03/23 112/60  01/24/23 112/70  01/19/23 113/73   Wt Readings from Last 3 Encounters:  01/24/23 246 lb 6.4 oz (111.8 kg)  01/12/23 249 lb (112.9 kg)  12/20/22 248 lb 9.6 oz (112.8 kg)   Body mass index is 37.46 kg/m.    Physical Exam Constitutional:      General: He is not in acute distress.    Appearance: Normal appearance. He is not ill-appearing.  HENT:     Head: Normocephalic.     Right Ear: Tympanic membrane, ear canal and external ear normal. There is no impacted cerumen.     Left Ear: Tympanic membrane, ear canal and external ear normal. There is no impacted cerumen.     Mouth/Throat:     Mouth: Mucous membranes are moist.     Pharynx: No oropharyngeal exudate or posterior oropharyngeal erythema.  Eyes:     Conjunctiva/sclera: Conjunctivae normal.  Cardiovascular:     Rate and Rhythm: Normal rate and regular rhythm.  Pulmonary:     Effort: Pulmonary effort is normal. No respiratory distress.     Breath sounds: Normal breath sounds. No wheezing or rales.  Musculoskeletal:     Cervical back: Neck supple. No tenderness.  Lymphadenopathy:     Cervical: No cervical adenopathy.  Skin:    General: Skin is warm and dry.      Findings: Bruising (Reviewed pictures of the bruising) present. No rash.     Comments: Abrasion left forearm with minimal active bleeding.  No pus Surrounding erythema which his wife states is new  Neurological:  Mental Status: He is alert.            Assessment & Plan:    See Problem List for Assessment and Plan of chronic medical problems.

## 2023-03-03 ENCOUNTER — Ambulatory Visit (INDEPENDENT_AMBULATORY_CARE_PROVIDER_SITE_OTHER): Payer: Medicare Other

## 2023-03-03 ENCOUNTER — Ambulatory Visit (INDEPENDENT_AMBULATORY_CARE_PROVIDER_SITE_OTHER): Payer: Medicare Other | Admitting: Internal Medicine

## 2023-03-03 ENCOUNTER — Ambulatory Visit: Payer: Medicare Other | Admitting: Physical Therapy

## 2023-03-03 ENCOUNTER — Encounter: Payer: Self-pay | Admitting: Internal Medicine

## 2023-03-03 VITALS — BP 112/60 | HR 60 | Temp 99.4°F | Ht 68.0 in

## 2023-03-03 DIAGNOSIS — U071 COVID-19: Secondary | ICD-10-CM

## 2023-03-03 DIAGNOSIS — R059 Cough, unspecified: Secondary | ICD-10-CM | POA: Diagnosis not present

## 2023-03-03 DIAGNOSIS — Z043 Encounter for examination and observation following other accident: Secondary | ICD-10-CM | POA: Diagnosis not present

## 2023-03-03 DIAGNOSIS — L089 Local infection of the skin and subcutaneous tissue, unspecified: Secondary | ICD-10-CM | POA: Diagnosis not present

## 2023-03-03 DIAGNOSIS — S50812A Abrasion of left forearm, initial encounter: Secondary | ICD-10-CM | POA: Diagnosis not present

## 2023-03-03 DIAGNOSIS — M1612 Unilateral primary osteoarthritis, left hip: Secondary | ICD-10-CM | POA: Diagnosis not present

## 2023-03-03 DIAGNOSIS — I700 Atherosclerosis of aorta: Secondary | ICD-10-CM | POA: Diagnosis not present

## 2023-03-03 DIAGNOSIS — M5136 Other intervertebral disc degeneration, lumbar region: Secondary | ICD-10-CM | POA: Diagnosis not present

## 2023-03-03 DIAGNOSIS — M545 Low back pain, unspecified: Secondary | ICD-10-CM | POA: Diagnosis not present

## 2023-03-03 DIAGNOSIS — Z950 Presence of cardiac pacemaker: Secondary | ICD-10-CM | POA: Diagnosis not present

## 2023-03-03 DIAGNOSIS — H8111 Benign paroxysmal vertigo, right ear: Secondary | ICD-10-CM | POA: Insufficient documentation

## 2023-03-03 DIAGNOSIS — Z96641 Presence of right artificial hip joint: Secondary | ICD-10-CM | POA: Diagnosis not present

## 2023-03-03 DIAGNOSIS — M48061 Spinal stenosis, lumbar region without neurogenic claudication: Secondary | ICD-10-CM | POA: Diagnosis not present

## 2023-03-03 LAB — POC COVID19 BINAXNOW: SARS Coronavirus 2 Ag: POSITIVE — AB

## 2023-03-03 LAB — POCT INFLUENZA A/B
Influenza A, POC: NEGATIVE
Influenza B, POC: NEGATIVE

## 2023-03-03 MED ORDER — AMOXICILLIN-POT CLAVULANATE 875-125 MG PO TABS: 1.0000 | ORAL_TABLET | Freq: Two times a day (BID) | ORAL | 0 refills | Status: AC

## 2023-03-03 MED ORDER — MUPIROCIN 2 % EX OINT: 1.0000 | TOPICAL_OINTMENT | Freq: Every day | CUTANEOUS | 2 refills | Status: DC

## 2023-03-03 MED ORDER — IPRATROPIUM BROMIDE 0.06 % NA SOLN: 2.0000 | NASAL | 5 refills | Status: DC | PRN

## 2023-03-03 MED ORDER — BENZONATATE 200 MG PO CAPS: 200.0000 mg | ORAL_CAPSULE | Freq: Three times a day (TID) | ORAL | 0 refills | Status: AC | PRN

## 2023-03-03 MED ORDER — LOPERAMIDE HCL 2 MG PO CAPS: 2.0000 mg | ORAL_CAPSULE | ORAL | 5 refills | Status: DC | PRN

## 2023-03-03 NOTE — Assessment & Plan Note (Signed)
Acute Related to recent fall at home Has bruising bilateral buttock regions Fall was not witnessed Will get x-rays of lumbar spine and pelvis to rule out fracture

## 2023-03-03 NOTE — Assessment & Plan Note (Signed)
Acute Wife and daughter had COVID last week There unsure exactly when his symptoms started, but most likely we are out of the window for any antiviral and given his antiseizure medications would interact with that we will not pursue that Tessalon Perles Chest x-ray today Symptomatic treatment

## 2023-03-03 NOTE — Patient Instructions (Addendum)
      Have xray downstairs.    Medications changes include :   Augmentin, tessalon perles for cough   Ask the pharmacist about cold meds over the counter.     Return if symptoms worsen or fail to improve.

## 2023-03-03 NOTE — Assessment & Plan Note (Signed)
Acute on chronic  Acute cough secondary to COVID She is out of the window for Paxlovid and there could be interactions with his antiseizure medications so we will not pursue that Tessalon Perles for cough Chest x-ray today Symptomatic treatment for COVID

## 2023-03-03 NOTE — Assessment & Plan Note (Signed)
Acute Secondary to fall he had at home few days ago Abrasion has minimal bleeding, but surrounding erythema which is new and seems to be getting worse Appears to be infected Start Augmentin 875-125 mg twice daily x 10 days Family will do wound changes and monitor closely

## 2023-03-07 DIAGNOSIS — F02B0 Dementia in other diseases classified elsewhere, moderate, without behavioral disturbance, psychotic disturbance, mood disturbance, and anxiety: Secondary | ICD-10-CM | POA: Diagnosis not present

## 2023-03-07 DIAGNOSIS — G20C Parkinsonism, unspecified: Secondary | ICD-10-CM | POA: Diagnosis not present

## 2023-03-07 DIAGNOSIS — G3183 Dementia with Lewy bodies: Secondary | ICD-10-CM | POA: Diagnosis not present

## 2023-03-08 ENCOUNTER — Ambulatory Visit: Payer: Medicare Other | Admitting: Physical Therapy

## 2023-03-08 DIAGNOSIS — H5010 Unspecified exotropia: Secondary | ICD-10-CM | POA: Diagnosis not present

## 2023-03-08 DIAGNOSIS — H5111 Convergence insufficiency: Secondary | ICD-10-CM | POA: Diagnosis not present

## 2023-03-10 NOTE — Therapy (Signed)
OUTPATIENT PHYSICAL THERAPY NEURO TREATMENT   Patient Name: Jeff Weaver MRN: [id number] DOB:Nov 29, 1945, 77 y.o., male Today's Date: 03/13/2023   PCP: Pincus Sanes, MD REFERRING PROVIDER: Pincus Sanes, MD  END OF SESSION:  PT End of Session - 03/13/23 1552     Visit Number 3    Number of Visits 25    Date for PT Re-Evaluation 05/15/23    Authorization Type MCR/[employer]    Authorization Time Period 02/20/23 to 05/15/23    Progress Note Due on Visit 10    PT Start Time 1508    PT Stop Time 1550    PT Time Calculation (min) 42 min    Equipment Utilized During Treatment Gait belt    Activity Tolerance Patient tolerated treatment well    Behavior During Therapy [facility] for tasks assessed/performed               Past Medical History:  Diagnosis Date   Aortic insufficiency    Atypical chest pain    Cardiac resynchronization therapy pacemaker (CRT-P) in place    Cerebral aneurysm    a. s/p clipping at [facility] in 8/13 c/b short-term memory loss, cerebral hemorrhage and seizure disorder s/p VP shunt.   Cerebral hemorrhage (HCC)    Chronic combined systolic and diastolic CHF (congestive heart failure) (HCC)    CVA (cerebral infarction)    Diverticulosis    Enteritis (regional)    Dr Juanda Chance   First degree AV block    GI bleed    HTN (hypertension)    Hyperlipidemia    Hypertensive heart disease    IBS (irritable bowel syndrome)    Lewy body dementia (HCC)    Mild CAD    Cardiac cath 01/12/18 showed minimal nonobstructive CAD, normal LVEF, normal LVEDP.   Mild dilation of ascending aorta (HCC)    Mitral regurgitation    NSVT (nonsustained ventricular tachycardia) (HCC)    Orthostatic hypotension    Pacemaker    Pre-diabetes    PVC's (premature ventricular contractions)    Regional enteritis of large intestine (HCC) since 1978   Rheumatoid arthritis(714.0)    dxed in [employer] in 1980s   Second degree AV block, Mobitz type I    Seizures (HCC)    last April 26,2021    Sinus bradycardia    Sleep apnea    SSS (sick sinus syndrome) (HCC)    Subdural hematoma (HCC)    Past Surgical History:  Procedure Laterality Date   BIV PACEMAKER INSERTION CRT-P N/A 03/10/2021   Procedure: BIV PACEMAKER INSERTION CRT-P UPGRADE;  Surgeon: Regan Lemming, MD;  Location: [facility] INVASIVE CV LAB;  Service: Cardiovascular;  Laterality: N/A;   CHOLECYSTECTOMY N/A 01/07/2016   Procedure: LAPAROSCOPIC CHOLECYSTECTOMY ;  Surgeon: Abigail Miyamoto, MD;  Location: [facility] OR;  Service: General;  Laterality: N/A;   cns shunt  02/23/12   COLONOSCOPY W/ POLYPECTOMY  1978   negative 2009, Dr Juanda Chance. Due 2014   CRANIOTOMY  02/02/12   Dr Kelby Aline, [facility]-clipping of aneurysm   CRANIOTOMY  02-02-12   left pterional craniotomy for clipping complex anterior communicating artery aneurysm    HERNIA REPAIR     LEFT HEART CATH AND CORONARY ANGIOGRAPHY N/A 01/12/2018   Procedure: LEFT HEART CATH AND CORONARY ANGIOGRAPHY;  Surgeon: Swaziland, Peter M, MD;  Location: [facility] INVASIVE CV LAB;  Service: Cardiovascular;  Laterality: N/A;   LEFT HEART CATHETERIZATION WITH CORONARY ANGIOGRAM N/A 11/26/2013   Procedure: LEFT HEART CATHETERIZATION WITH CORONARY ANGIOGRAM;  Surgeon:  Marykay Lex, MD;  Location: [facility] CATH LAB;  Service: Cardiovascular;  Laterality: N/A;   NOSE SURGERY     PACEMAKER IMPLANT N/A 07/02/2018   Procedure: PACEMAKER IMPLANT;  Surgeon: Regan Lemming, MD;  Location: [facility] INVASIVE CV LAB;  Service: Cardiovascular;  Laterality: N/A;   SHOULDER SURGERY  1997   TONSILLECTOMY AND ADENOIDECTOMY     VENTRICULOPERITONEAL SHUNT  02-23-12   INSERTION OF RIGHT FRONTAL VENTRICULOPERITONEAL SHUNT WITH A CODMAN HAKIM PROGRAMMABLE VALVE   Patient Active Problem List   Diagnosis Date Noted   Benign paroxysmal positional vertigo of right ear 03/03/2023   COVID-19 03/03/2023   Abrasion, forearm with infection, left, initial encounter 03/03/2023   Physical deconditioning 12/20/2022   Poor balance 12/20/2022    PVC's (premature ventricular contractions) 09/19/2022   Diplopia 09/19/2022   Parkinsonism 06/19/2022   Primary localized osteoarthrosis of shoulder region 05/10/2022   Recurrent dislocation of shoulder joint 05/10/2022   Regional enteritis (HCC) 05/10/2022   Vestibular hypofunction of left ear 05/10/2022   Sensorineural hearing loss 05/10/2022   Senile osteoporosis 02/03/2022   Pain in right hip 01/05/2022   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   Intertrigo 10/01/2021   HFrEF (heart failure with reduced ejection fraction) (HCC) 09/18/2021   Blepharitis of upper and lower eyelids of both eyes 09/18/2021   Tinea cruris 09/18/2021   Lewy body dementia (HCC) 09/16/2021   Closed fracture of right hip (HCC) 09/16/2021   S/P hip replacement, right  07/2021 09/16/2021   Presence of right artificial hip joint 08/06/2021   Vascular dementia (HCC) 04/29/2021   Multiple falls 04/29/2021   Cough 04/09/2021   Vitamin D deficiency 04/09/2021   Vitamin B1 deficiency 04/09/2021   Right arm pain 01/06/2021   Seizures (HCC)    Muscle cramping 12/30/2019   Dizziness 12/30/2019   Unsteady gait 12/30/2019   Pacemaker 11/27/2019   Fatigue 07/09/2019   SSS (sick sinus syndrome) (HCC) 07/02/2018   Bilateral shoulder pain 06/14/2018   Upper airway cough syndrome vs cough variant asthma 03/20/2018   Acute midline low back pain without sciatica 12/25/2017   Depression 10/17/2017   CAD (coronary artery disease), hx of NSTEMI 08/15/2017   Chest pain 07/17/2017   SOBOE (shortness of breath on exertion) 06/08/2017   Bilateral sensorineural hearing loss 06/01/2017   Nasal turbinate hypertrophy 06/01/2017   Dysphagia 05/31/2017   Osteoporosis 01/30/2017   History of cerebral hemorrhage 12/28/2016   Seborrheic dermatitis 07/25/2016   Cognitive communication deficit 06/28/2016   Hypertensive heart disease 06/28/2016   Vitamin B 12 deficiency 03/16/2016   Confusion 03/13/2016   VP (ventriculoperitoneal) shunt  status 02/12/2016   Orthostatic hypotension 01/05/2016   Benign prostatic hyperplasia with urinary obstruction 08/04/2014   Urinary urgency 08/04/2014   Gastroesophageal reflux disease 07/14/2014   Polymyalgia rheumatica (HCC) 05/28/2014   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/2014   SBE (subacute bacterial endocarditis) prophylaxis candidate 11/24/2013   Aphasia 07/17/2013   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 seizures, not intractable, without status epilepticus (HCC) 07/17/2013   Subdural hemorrhage (HCC) 07/17/2013   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HCC) 07/17/2013   Pre-diabetes 03/26/2013   Urinary frequency 10/31/2012   Nocturia 10/31/2012   History of cerebral aneurysm repair 10/18/2012   History of stroke without residual deficits 10/18/2012   Ocular rosacea 09/27/2012   Seizure disorder (HCC) 07/23/2012   Entropion 07/10/2012   Hyperopia with astigmatism 07/10/2012   Combined senile cataract 04/13/2012   Presbyopia 04/13/2012   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) 03/01/2012   Stroke (HCC) 03/01/2012   Cognitive safety issue 02/20/2012   Nonruptured cerebral aneurysm 11/23/2011   Obstructive sleep apnea syndrome 08/09/2011   Hyperlipidemia 02/03/2009   HTN (hypertension) 02/03/2009   History of Crohn's disease 04/17/2008   Allergic rhinitis, unspecified 07/05/1999   Cataract, right eye 07/05/1999    ONSET DATE: chronic   REFERRING DIAG: R26.89 (ICD-10-CM) - Poor balance R53.81 (ICD-10-CM) - Physical deconditioning  THERAPY DIAG:  Muscle weakness (generalized)  Unsteadiness on feet  Other abnormalities of gait and mobility  Rationale for Evaluation and Treatment: Rehabilitation  SUBJECTIVE:                                                                                                                                                                                             SUBJECTIVE STATEMENT: "I'm in piss poor  health."   Pt accompanied by: significant other  PERTINENT HISTORY: Aortic insufficiency, CRT-P pacemaker placement, cerebral aneurysm s/p clipping, cerebral hemorrhage and seizure disorder s/p VP shunt, CHF, CVA, second degree AV block Mobitz type 1 s/p PPM, HTN, HLD, hypertensive heart disease, IBS, lew body dementia, mitral regurgitation, NSVT, orthostatic hypotension, RA, SSS, subdural hematoma, hernia repair, cardiac cath  PAIN:  Are you having pain? Yes: NPRS scale: "chronic back" 6/10 Pain location: R low back  Pain description: discomfort, some radicular pains down legs especially in the mornings  Aggravating factors: standing up, sitting unsupported Relieving factors: movement  PRECAUTIONS: Fall and ICD/Pacemaker, poor vision, STM impairments  RED FLAGS: None   WEIGHT BEARING RESTRICTIONS: No  FALLS: Has patient fallen in last 6 months? Yes. Number of falls 2 in the past 6 months, had additional falls >6 months ago; per spouse, falls a lot when bending to pick something up off the floor, fall with SDH and hip fracture in 2023 was due to slipping on wet grass outside   LIVING ENVIRONMENT: Lives with: lives with their family Lives in: House/apartment Stairs: Yes: Internal: 14 steps; one step to enter home  Has following equipment at home: Single point cane and "semi-hospital bed that adjusts the head, walker with 2 wheels, 4WW and WC, shower bench"   PLOF: Independent, Independent with basic ADLs, Requires assistive device for independence, and handheld shower head; per spouse "house is older and difficult to renovate, may try to convert bathroom to a shower stall  PATIENT GOALS: improve strength, balance, endurance   OBJECTIVE:    TODAY'S TREATMENT: 03/13/23 Activity Comments  Vitals at start of session  93% spO2 72bpm, 143/90 mmHg   Nustep L5 x 6 min UEs/LEs  Pt reporting feeling "  too easy." Advised to stay at this resistance to allow for reassessment next time to assess  tolerance   review HEP: sitting march 2x20 2nd set with red loop LAQ  10x sitting ankle DF 10x  Heavy cueing to stay on task and maintain amplitude throughout   Sitting heel raise 10x  Good ROM  Gait training with SPC 166ft Heavy verbal and manual cueing for proper sequenicng and step length   Review of set up for STS  Responded well     PATIENT EDUCATION: Education details: HEP update and walking program inside the house; daughter reports pt has recently bee diagnosed with Parksinonism and requested initiation of PWR moves ; recommended RW as main device  Person educated: Patient and Child(ren) (daughter) Education method: Explanation, Demonstration, Tactile cues, Verbal cues, and Handouts Education comprehension: verbalized understanding and returned demonstration    HOME EXERCISE PROGRAM: Access Code: [email or web address] URL: [email or web address] Date: 03/13/2023 Prepared by: [facility] - [facility]  Program Notes walking program: practice walking with the walker for 5 minutes inside the house every hour   Exercises - Seated March with Resistance  - 1 x daily - 5 x weekly - 2 sets - 10 reps - Seated Long Arc Quad  - 1 x daily - 5 x weekly - 3 sets - 10 reps - Seated Ankle Dorsiflexion AROM  - 1 x daily - 7 x weekly - 3 sets - 10 reps - 3 sec hold - Seated Heel Raise  - 1 x daily - 5 x weekly - 2 sets - 10 reps    ----------------------------------------------------- Objective measures below taken at initial evaluation:  COGNITION: Overall cognitive status: History of cognitive impairments - at baseline       POSTURE: rounded shoulders, forward head, decreased lumbar lordosis, and increased thoracic kyphosis, flexed at hips     LOWER EXTREMITY MMT:    MMT Right Eval Left Eval  Hip flexion 3 3  Hip extension    Hip abduction 3 seated  3 seated  Hip adduction    Hip internal rotation    Hip external rotation    Knee flexion     Knee extension 4 4  Ankle dorsiflexion 3+ 3+  Ankle plantarflexion    Ankle inversion    Ankle eversion    (Blank rows = not tested)    TRANSFERS: Assistive device utilized: Single point cane  Sit to stand: SBA Stand to sit: SBA Chair to chair: SBA      GAIT: Gait pattern: step through pattern, decreased arm swing- Right, decreased arm swing- Left, decreased step length- Right, decreased step length- Left, decreased stance time- Right, decreased stance time- Left, decreased stride length, decreased ankle dorsiflexion- Right, decreased ankle dorsiflexion- Left, Right foot flat, Left foot flat, shuffling, trendelenburg, decreased trunk rotation, trunk flexed, and wide BOS Distance walked: in clinic distances  Assistive device utilized: Single point cane Level of assistance: SBA Comments: slow and steady with SPC   FUNCTIONAL TESTS:  5 times sit to stand: 58 seconds use of UEs on chair  Timed up and go (TUG): 44 seconds, SPC  Gait speed 0.14 m/s with SPC   PATIENT SURVEYS:  FOTO next visit   TODAY'S TREATMENT:  DATE:   Eval  Objective measures, care planning    PATIENT EDUCATION: Education details: exam findings, POC, offered SLP and OT skilled services  Person educated: Patient and Spouse Education method: Explanation Education comprehension: verbalized understanding and needs further education  HOME EXERCISE PROGRAM: TBD   GOALS: Goals reviewed with patient? Yes  SHORT TERM GOALS: Target date: 04/03/2023    Will be compliant with appropriate progressive HEP with no more than MinA from family  Baseline: Goal status: IN PROGRESS  2.  Will improve gait speed to at least 0.5m/s with LRAD  Baseline:  Goal status: IN PROGRESS  3.  Will complete TUG test in 30 seconds or better with LRAD  Baseline:  Goal status: IN PROGRESS  4.  Will  complete 5xSTS test in 45 seconds or less with UEs  Baseline:  Goal status: IN PROGRESS  5.  Will tolerate walking and/or activity in PT for at least 10 minutes without needing a rest break  Baseline:  Goal status: IN PROGRESS    LONG TERM GOALS: Target date: 05/15/2023    MMT to improve by one grade in all weak groups  Baseline:  Goal status: IN PROGRESS  2.  Will be able to ambulate at least 336ft in (0.61m/s) with LRAD to show improved community access no rest breaks   Baseline:  Goal status: IN PROGRESS  3.  Will score at least 42 on the Berg to show reduced fall risk  Baseline:  Goal status: IN PROGRESS  4.  Will complete 5xSTS test in 20 seconds or less with UEs to show improved functional mobility  Baseline:  Goal status: IN PROGRESS  5.  Will be compliant with appropriate progressive long term exercise plan with no more than MinA from family to combat sedentary lifestyle and maintain functional gains from PT  Baseline:  Goal status: IN PROGRESS    ASSESSMENT:  CLINICAL IMPRESSION: Patient arrived to session without new complaints. Daughter was present for part of session for improved HEP/instruction carryover. Reviewed HEP which required verbal cueing to maintain consistent amplitude and remain on task. Trialed gait training with SPC which revealed poor sequencing and step length/height, thus recommended pt use RW as main device. Will trial PWR! Moves in sitting per daughter's request as she reports recent diagnosis of Parkinsonism. No complaints upon leaving.   OBJECTIVE IMPAIRMENTS: Abnormal gait, decreased activity tolerance, decreased balance, decreased cognition, decreased coordination, decreased knowledge of use of DME, decreased mobility, difficulty walking, decreased strength, decreased safety awareness, postural dysfunction, and obesity.   ACTIVITY LIMITATIONS: lifting, bending, standing, squatting, stairs, transfers, bed mobility, and locomotion  level  PARTICIPATION LIMITATIONS: meal prep, cleaning, shopping, community activity, yard work, and church  PERSONAL FACTORS: Age, Fitness, Past/current experiences, Social background, and Time since onset of injury/illness/exacerbation are also affecting patient's functional outcome.   REHAB POTENTIAL: Good  CLINICAL DECISION MAKING: Evolving/moderate complexity  EVALUATION COMPLEXITY: Moderate  PLAN:  PT FREQUENCY: 2x/week  PT DURATION: 12 weeks  PLANNED INTERVENTIONS: Therapeutic exercises, Therapeutic activity, Neuromuscular re-education, Balance training, Gait training, Patient/Family education, Self Care, Stair training, DME instructions, Aquatic Therapy, Dry Needling, Cognitive remediation, Cryotherapy, Moist heat, Manual therapy, and Re-evaluation  PLAN FOR NEXT SESSION: initiate sitting PWR! Moves; Review initial HEP and progress as able (try to add seated hamstring stretch).  Utilize NuStep, Work on strength, balance, endurance as tolerated.      Anette Guarneri, PT, DPT 03/13/23 3:55 PM  [place] [facility] [address] [place], [place] [address] Phone # [phone] Fax # [phone]

## 2023-03-13 ENCOUNTER — Ambulatory Visit: Payer: Medicare Other | Attending: Internal Medicine | Admitting: Physical Therapy

## 2023-03-13 ENCOUNTER — Encounter: Payer: Self-pay | Admitting: Physical Therapy

## 2023-03-13 DIAGNOSIS — M6281 Muscle weakness (generalized): Secondary | ICD-10-CM | POA: Diagnosis not present

## 2023-03-13 DIAGNOSIS — R471 Dysarthria and anarthria: Secondary | ICD-10-CM | POA: Insufficient documentation

## 2023-03-13 DIAGNOSIS — R1310 Dysphagia, unspecified: Secondary | ICD-10-CM | POA: Insufficient documentation

## 2023-03-13 DIAGNOSIS — I69918 Other symptoms and signs involving cognitive functions following unspecified cerebrovascular disease: Secondary | ICD-10-CM | POA: Diagnosis not present

## 2023-03-13 DIAGNOSIS — R41840 Attention and concentration deficit: Secondary | ICD-10-CM | POA: Diagnosis not present

## 2023-03-13 DIAGNOSIS — R2681 Unsteadiness on feet: Secondary | ICD-10-CM | POA: Diagnosis not present

## 2023-03-13 DIAGNOSIS — R2689 Other abnormalities of gait and mobility: Secondary | ICD-10-CM | POA: Diagnosis not present

## 2023-03-13 DIAGNOSIS — R293 Abnormal posture: Secondary | ICD-10-CM | POA: Diagnosis not present

## 2023-03-15 ENCOUNTER — Other Ambulatory Visit: Payer: Self-pay

## 2023-03-15 ENCOUNTER — Ambulatory Visit: Payer: Medicare Other

## 2023-03-15 ENCOUNTER — Ambulatory Visit: Payer: Medicare Other | Admitting: Occupational Therapy

## 2023-03-15 DIAGNOSIS — R293 Abnormal posture: Secondary | ICD-10-CM

## 2023-03-15 DIAGNOSIS — M6281 Muscle weakness (generalized): Secondary | ICD-10-CM | POA: Diagnosis not present

## 2023-03-15 DIAGNOSIS — I69918 Other symptoms and signs involving cognitive functions following unspecified cerebrovascular disease: Secondary | ICD-10-CM

## 2023-03-15 DIAGNOSIS — R2681 Unsteadiness on feet: Secondary | ICD-10-CM

## 2023-03-15 DIAGNOSIS — R2689 Other abnormalities of gait and mobility: Secondary | ICD-10-CM

## 2023-03-15 DIAGNOSIS — R1310 Dysphagia, unspecified: Secondary | ICD-10-CM

## 2023-03-15 DIAGNOSIS — R41840 Attention and concentration deficit: Secondary | ICD-10-CM

## 2023-03-15 DIAGNOSIS — R471 Dysarthria and anarthria: Secondary | ICD-10-CM

## 2023-03-15 NOTE — Therapy (Signed)
OUTPATIENT PHYSICAL THERAPY NEURO TREATMENT   Patient Name: Jeff Weaver MRN: [id number] DOB:Jan 18, 1946, 77 y.o., male Today's Date: 03/15/2023   PCP: Pincus Sanes, MD REFERRING PROVIDER: Pincus Sanes, MD  END OF SESSION:  PT End of Session - 03/15/23 1540     Visit Number 4    Number of Visits 25    Date for PT Re-Evaluation 05/15/23    Authorization Type MCR/[employer]    Authorization Time Period 02/20/23 to 05/15/23    Progress Note Due on Visit 10    PT Start Time 1540   late arrival from ST session   PT Stop Time 1615    PT Time Calculation (min) 35 min    Equipment Utilized During Treatment Gait belt    Activity Tolerance Patient tolerated treatment well    Behavior During Therapy [facility] for tasks assessed/performed               Past Medical History:  Diagnosis Date   Aortic insufficiency    Atypical chest pain    Cardiac resynchronization therapy pacemaker (CRT-P) in place    Cerebral aneurysm    a. s/p clipping at [facility] in 8/13 c/b short-term memory loss, cerebral hemorrhage and seizure disorder s/p VP shunt.   Cerebral hemorrhage (HCC)    Chronic combined systolic and diastolic CHF (congestive heart failure) (HCC)    CVA (cerebral infarction)    Diverticulosis    Enteritis (regional)    Dr Juanda Chance   First degree AV block    GI bleed    HTN (hypertension)    Hyperlipidemia    Hypertensive heart disease    IBS (irritable bowel syndrome)    Lewy body dementia (HCC)    Mild CAD    Cardiac cath 01/12/18 showed minimal nonobstructive CAD, normal LVEF, normal LVEDP.   Mild dilation of ascending aorta (HCC)    Mitral regurgitation    NSVT (nonsustained ventricular tachycardia) (HCC)    Orthostatic hypotension    Pacemaker    Pre-diabetes    PVC's (premature ventricular contractions)    Regional enteritis of large intestine (HCC) since 1978   Rheumatoid arthritis(714.0)    dxed in [employer] in 1980s   Second degree AV block, Mobitz type I    Seizures  (HCC)    last April 26,2021   Sinus bradycardia    Sleep apnea    SSS (sick sinus syndrome) (HCC)    Subdural hematoma (HCC)    Past Surgical History:  Procedure Laterality Date   BIV PACEMAKER INSERTION CRT-P N/A 03/10/2021   Procedure: BIV PACEMAKER INSERTION CRT-P UPGRADE;  Surgeon: Regan Lemming, MD;  Location: [facility] INVASIVE CV LAB;  Service: Cardiovascular;  Laterality: N/A;   CHOLECYSTECTOMY N/A 01/07/2016   Procedure: LAPAROSCOPIC CHOLECYSTECTOMY ;  Surgeon: Abigail Miyamoto, MD;  Location: [facility] OR;  Service: General;  Laterality: N/A;   cns shunt  02/23/12   COLONOSCOPY W/ POLYPECTOMY  1978   negative 2009, Dr Juanda Chance. Due 2014   CRANIOTOMY  02/02/12   Dr Kelby Aline, [facility]-clipping of aneurysm   CRANIOTOMY  02-02-12   left pterional craniotomy for clipping complex anterior communicating artery aneurysm    HERNIA REPAIR     LEFT HEART CATH AND CORONARY ANGIOGRAPHY N/A 01/12/2018   Procedure: LEFT HEART CATH AND CORONARY ANGIOGRAPHY;  Surgeon: Swaziland, Peter M, MD;  Location: [facility] INVASIVE CV LAB;  Service: Cardiovascular;  Laterality: N/A;   LEFT HEART CATHETERIZATION WITH CORONARY ANGIOGRAM N/A 11/26/2013   Procedure: LEFT HEART  CATHETERIZATION WITH CORONARY ANGIOGRAM;  Surgeon: Marykay Lex, MD;  Location: [facility] CATH LAB;  Service: Cardiovascular;  Laterality: N/A;   NOSE SURGERY     PACEMAKER IMPLANT N/A 07/02/2018   Procedure: PACEMAKER IMPLANT;  Surgeon: Regan Lemming, MD;  Location: [facility] INVASIVE CV LAB;  Service: Cardiovascular;  Laterality: N/A;   SHOULDER SURGERY  1997   TONSILLECTOMY AND ADENOIDECTOMY     VENTRICULOPERITONEAL SHUNT  02-23-12   INSERTION OF RIGHT FRONTAL VENTRICULOPERITONEAL SHUNT WITH A CODMAN HAKIM PROGRAMMABLE VALVE   Patient Active Problem List   Diagnosis Date Noted   Benign paroxysmal positional vertigo of right ear 03/03/2023   COVID-19 03/03/2023   Abrasion, forearm with infection, left, initial encounter 03/03/2023   Physical deconditioning  12/20/2022   Poor balance 12/20/2022   PVC's (premature ventricular contractions) 09/19/2022   Diplopia 09/19/2022   Parkinsonism 06/19/2022   Primary localized osteoarthrosis of shoulder region 05/10/2022   Recurrent dislocation of shoulder joint 05/10/2022   Regional enteritis (HCC) 05/10/2022   Vestibular hypofunction of left ear 05/10/2022   Sensorineural hearing loss 05/10/2022   Senile osteoporosis 02/03/2022   Pain in right hip 01/05/2022   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   Intertrigo 10/01/2021   HFrEF (heart failure with reduced ejection fraction) (HCC) 09/18/2021   Blepharitis of upper and lower eyelids of both eyes 09/18/2021   Tinea cruris 09/18/2021   Lewy body dementia (HCC) 09/16/2021   Closed fracture of right hip (HCC) 09/16/2021   S/P hip replacement, right  07/2021 09/16/2021   Presence of right artificial hip joint 08/06/2021   Vascular dementia (HCC) 04/29/2021   Multiple falls 04/29/2021   Cough 04/09/2021   Vitamin D deficiency 04/09/2021   Vitamin B1 deficiency 04/09/2021   Right arm pain 01/06/2021   Seizures (HCC)    Muscle cramping 12/30/2019   Dizziness 12/30/2019   Unsteady gait 12/30/2019   Pacemaker 11/27/2019   Fatigue 07/09/2019   SSS (sick sinus syndrome) (HCC) 07/02/2018   Bilateral shoulder pain 06/14/2018   Upper airway cough syndrome vs cough variant asthma 03/20/2018   Acute midline low back pain without sciatica 12/25/2017   Depression 10/17/2017   CAD (coronary artery disease), hx of NSTEMI 08/15/2017   Chest pain 07/17/2017   SOBOE (shortness of breath on exertion) 06/08/2017   Bilateral sensorineural hearing loss 06/01/2017   Nasal turbinate hypertrophy 06/01/2017   Dysphagia 05/31/2017   Osteoporosis 01/30/2017   History of cerebral hemorrhage 12/28/2016   Seborrheic dermatitis 07/25/2016   Cognitive communication deficit 06/28/2016   Hypertensive heart disease 06/28/2016   Vitamin B 12 deficiency 03/16/2016   Confusion  03/13/2016   VP (ventriculoperitoneal) shunt status 02/12/2016   Orthostatic hypotension 01/05/2016   Benign prostatic hyperplasia with urinary obstruction 08/04/2014   Urinary urgency 08/04/2014   Gastroesophageal reflux disease 07/14/2014   Polymyalgia rheumatica (HCC) 05/28/2014   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/2014   SBE (subacute bacterial endocarditis) prophylaxis candidate 11/24/2013   Aphasia 07/17/2013   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 seizures, not intractable, without status epilepticus (HCC) 07/17/2013   Subdural hemorrhage (HCC) 07/17/2013   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HCC) 07/17/2013   Pre-diabetes 03/26/2013   Urinary frequency 10/31/2012   Nocturia 10/31/2012   History of cerebral aneurysm repair 10/18/2012   History of stroke without residual deficits 10/18/2012   Ocular rosacea 09/27/2012   Seizure disorder (HCC) 07/23/2012   Entropion 07/10/2012   Hyperopia with astigmatism 07/10/2012   Combined senile cataract 04/13/2012   Presbyopia 04/13/2012  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) 03/01/2012   Stroke (HCC) 03/01/2012   Cognitive safety issue 02/20/2012   Nonruptured cerebral aneurysm 11/23/2011   Obstructive sleep apnea syndrome 08/09/2011   Hyperlipidemia 02/03/2009   HTN (hypertension) 02/03/2009   History of Crohn's disease 04/17/2008   Allergic rhinitis, unspecified 07/05/1999   Cataract, right eye 07/05/1999    ONSET DATE: chronic   REFERRING DIAG: R26.89 (ICD-10-CM) - Poor balance R53.81 (ICD-10-CM) - Physical deconditioning  THERAPY DIAG:  Muscle weakness (generalized)  Unsteadiness on feet  Other abnormalities of gait and mobility  Rationale for Evaluation and Treatment: Rehabilitation  SUBJECTIVE:                                                                                                                                                                                              SUBJECTIVE STATEMENT: "Can't lift my head up"  Pt accompanied by: significant other  PERTINENT HISTORY: Aortic insufficiency, CRT-P pacemaker placement, cerebral aneurysm s/p clipping, cerebral hemorrhage and seizure disorder s/p VP shunt, CHF, CVA, second degree AV block Mobitz type 1 s/p PPM, HTN, HLD, hypertensive heart disease, IBS, lew body dementia, mitral regurgitation, NSVT, orthostatic hypotension, RA, SSS, subdural hematoma, hernia repair, cardiac cath  PAIN:  Are you having pain? Yes: NPRS scale: "chronic back" 6/10 Pain location: R low back  Pain description: discomfort, some radicular pains down legs especially in the mornings  Aggravating factors: standing up, sitting unsupported Relieving factors: movement  PRECAUTIONS: Fall and ICD/Pacemaker, poor vision, STM impairments  RED FLAGS: None   WEIGHT BEARING RESTRICTIONS: No  FALLS: Has patient fallen in last 6 months? Yes. Number of falls 2 in the past 6 months, had additional falls >6 months ago; per spouse, falls a lot when bending to pick something up off the floor, fall with SDH and hip fracture in 2023 was due to slipping on wet grass outside   LIVING ENVIRONMENT: Lives with: lives with their family Lives in: House/apartment Stairs: Yes: Internal: 14 steps; one step to enter home  Has following equipment at home: Single point cane and "semi-hospital bed that adjusts the head, walker with 2 wheels, 4WW and WC, shower bench"   PLOF: Independent, Independent with basic ADLs, Requires assistive device for independence, and handheld shower head; per spouse "house is older and difficult to renovate, may try to convert bathroom to a shower stall  PATIENT GOALS: improve strength, balance, endurance   OBJECTIVE:   TODAY'S TREATMENT: 03/15/23 Activity Comments  Cervical extension SNAG 10x with cues for sequence and ROM  Lateral shift and reach 1x10 -tactile cues for  weight shift-performed at counter with overhead  cabinets for target  Gastroc stretch 1x60 sec   HEP review 1x10   NU-step speed intervals x 6 min 15 sec 70+ SPM, 30 sec 35-50 SPM        TODAY'S TREATMENT: 03/13/23 Activity Comments  Vitals at start of session  93% spO2 72bpm, 143/90 mmHg   Nustep L5 x 6 min UEs/LEs  Pt reporting feeling "too easy." Advised to stay at this resistance to allow for reassessment next time to assess tolerance   review HEP: sitting march 2x20 2nd set with red loop LAQ  10x sitting ankle DF 10x  Heavy cueing to stay on task and maintain amplitude throughout   Sitting heel raise 10x  Good ROM  Gait training with SPC 159ft Heavy verbal and manual cueing for proper sequenicng and step length   Review of set up for STS  Responded well     PATIENT EDUCATION: Education details: HEP update and walking program inside the house; daughter reports pt has recently bee diagnosed with Parksinonism and requested initiation of PWR moves ; recommended RW as main device  Person educated: Patient and Child(ren) (daughter) Education method: Explanation, Demonstration, Tactile cues, Verbal cues, and Handouts Education comprehension: verbalized understanding and returned demonstration    HOME EXERCISE PROGRAM: Access Code: [email or web address] URL: [email or web address] Date: 03/13/2023 Prepared by: [facility] Outpatient  Rehab - [facility]  Program Notes walking program: practice walking with the walker for 5 minutes inside the house every hour   Exercises - Seated March with Resistance  - 1 x daily - 5 x weekly - 2 sets - 10 reps - Seated Long Arc Quad  - 1 x daily - 5 x weekly - 3 sets - 10 reps - Seated Ankle Dorsiflexion AROM  - 1 x daily - 7 x weekly - 3 sets - 10 reps - 3 sec hold - Seated Heel Raise  - 1 x daily - 5 x weekly - 2 sets - 10 reps - Cervical Extension AROM with Strap  - 1 x daily - 7 x weekly - 3 sets - 10 reps - Side to Side Weight Shift with Overhead Reach and Counter Support  - 1 x  daily - 7 x weekly - 3 sets - 10 reps - Standing Gastroc Stretch at Counter  - 1 x daily - 7 x weekly - 3 sets - 30-60 sec hold   ----------------------------------------------------- Objective measures below taken at initial evaluation:  COGNITION: Overall cognitive status: History of cognitive impairments - at baseline       POSTURE: rounded shoulders, forward head, decreased lumbar lordosis, and increased thoracic kyphosis, flexed at hips     LOWER EXTREMITY MMT:    MMT Right Eval Left Eval  Hip flexion 3 3  Hip extension    Hip abduction 3 seated  3 seated  Hip adduction    Hip internal rotation    Hip external rotation    Knee flexion    Knee extension 4 4  Ankle dorsiflexion 3+ 3+  Ankle plantarflexion    Ankle inversion    Ankle eversion    (Blank rows = not tested)    TRANSFERS: Assistive device utilized: Single point cane  Sit to stand: SBA Stand to sit: SBA Chair to chair: SBA      GAIT: Gait pattern: step through pattern, decreased arm swing- Right, decreased arm swing- Left, decreased step length- Right, decreased step length- Left, decreased stance time- Right, decreased  stance time- Left, decreased stride length, decreased ankle dorsiflexion- Right, decreased ankle dorsiflexion- Left, Right foot flat, Left foot flat, shuffling, trendelenburg, decreased trunk rotation, trunk flexed, and wide BOS Distance walked: in clinic distances  Assistive device utilized: Single point cane Level of assistance: SBA Comments: slow and steady with SPC   FUNCTIONAL TESTS:  5 times sit to stand: 58 seconds use of UEs on chair  Timed up and go (TUG): 44 seconds, SPC  Gait speed 0.14 m/s with SPC   PATIENT SURVEYS:  FOTO next visit   TODAY'S TREATMENT:                                                                                                                              DATE:   Eval  Objective measures, care planning    PATIENT  EDUCATION: Education details: exam findings, POC, offered SLP and OT skilled services  Person educated: Patient and Spouse Education method: Explanation Education comprehension: verbalized understanding and needs further education  HOME EXERCISE PROGRAM: TBD   GOALS: Goals reviewed with patient? Yes  SHORT TERM GOALS: Target date: 04/03/2023    Will be compliant with appropriate progressive HEP with no more than MinA from family  Baseline: Goal status: IN PROGRESS  2.  Will improve gait speed to at least 0.83m/s with LRAD  Baseline:  Goal status: IN PROGRESS  3.  Will complete TUG test in 30 seconds or better with LRAD  Baseline:  Goal status: IN PROGRESS  4.  Will complete 5xSTS test in 45 seconds or less with UEs  Baseline:  Goal status: IN PROGRESS  5.  Will tolerate walking and/or activity in PT for at least 10 minutes without needing a rest break  Baseline:  Goal status: IN PROGRESS    LONG TERM GOALS: Target date: 05/15/2023    MMT to improve by one grade in all weak groups  Baseline:  Goal status: IN PROGRESS  2.  Will be able to ambulate at least 378ft in (0.58m/s) with LRAD to show improved community access no rest breaks   Baseline:  Goal status: IN PROGRESS  3.  Will score at least 42 on the Berg to show reduced fall risk  Baseline:  Goal status: IN PROGRESS  4.  Will complete 5xSTS test in 20 seconds or less with UEs to show improved functional mobility  Baseline:  Goal status: IN PROGRESS  5.  Will be compliant with appropriate progressive long term exercise plan with no more than MinA from family to combat sedentary lifestyle and maintain functional gains from PT  Baseline:  Goal status: IN PROGRESS    ASSESSMENT:  CLINICAL IMPRESSION: Pt reports neck pain with extension and attempts at lifting head to neutral.  Demo and performance of extension SNAG with edge of towel to facilitate head-up posture for greater ROM and imrpoved ability  to interact with environment. Initiated large amplitude movement at counter to improve weight shifting  and mobility requiring tactile cues to facilitate weight shift vs reaching. Ended with there ex for flexibility and interval efforts for cardio/neuro benefits  OBJECTIVE IMPAIRMENTS: Abnormal gait, decreased activity tolerance, decreased balance, decreased cognition, decreased coordination, decreased knowledge of use of DME, decreased mobility, difficulty walking, decreased strength, decreased safety awareness, postural dysfunction, and obesity.   ACTIVITY LIMITATIONS: lifting, bending, standing, squatting, stairs, transfers, bed mobility, and locomotion level  PARTICIPATION LIMITATIONS: meal prep, cleaning, shopping, community activity, yard work, and church  PERSONAL FACTORS: Age, Fitness, Past/current experiences, Social background, and Time since onset of injury/illness/exacerbation are also affecting patient's functional outcome.   REHAB POTENTIAL: Good  CLINICAL DECISION MAKING: Evolving/moderate complexity  EVALUATION COMPLEXITY: Moderate  PLAN:  PT FREQUENCY: 2x/week  PT DURATION: 12 weeks  PLANNED INTERVENTIONS: Therapeutic exercises, Therapeutic activity, Neuromuscular re-education, Balance training, Gait training, Patient/Family education, Self Care, Stair training, DME instructions, Aquatic Therapy, Dry Needling, Cognitive remediation, Cryotherapy, Moist heat, Manual therapy, and Re-evaluation  PLAN FOR NEXT SESSION: initiate sitting PWR! Moves; Review initial HEP and progress as able (try to add seated hamstring stretch).  Utilize NuStep, Work on strength, balance, endurance as tolerated.      5:01 PM, 03/15/23 M. Shary Decamp, PT, DPT Physical Therapist- [place] Office Number: [phone]

## 2023-03-15 NOTE — Therapy (Signed)
OUTPATIENT OCCUPATIONAL THERAPY NEURO EVALUATION  Patient Name: Jeff Weaver MRN: [id number] DOB:07-30-1945, 78 y.o., male Today's Date: 03/15/2023  PCP: Pincus Sanes, MD REFERRING PROVIDER: Pincus Sanes, MD  END OF SESSION:  OT End of Session - 03/15/23 1524     Visit Number 1    Number of Visits 17    Date for OT Re-Evaluation 05/12/23    Authorization Type Medicare A&B    OT Start Time 1408    OT Stop Time 1450    OT Time Calculation (min) 42 min             Past Medical History:  Diagnosis Date   Aortic insufficiency    Atypical chest pain    Cardiac resynchronization therapy pacemaker (CRT-P) in place    Cerebral aneurysm    a. s/p clipping at [facility] in 8/13 c/b short-term memory loss, cerebral hemorrhage and seizure disorder s/p VP shunt.   Cerebral hemorrhage (HCC)    Chronic combined systolic and diastolic CHF (congestive heart failure) (HCC)    CVA (cerebral infarction)    Diverticulosis    Enteritis (regional)    Dr Juanda Chance   First degree AV block    GI bleed    HTN (hypertension)    Hyperlipidemia    Hypertensive heart disease    IBS (irritable bowel syndrome)    Lewy body dementia (HCC)    Mild CAD    Cardiac cath 01/12/18 showed minimal nonobstructive CAD, normal LVEF, normal LVEDP.   Mild dilation of ascending aorta (HCC)    Mitral regurgitation    NSVT (nonsustained ventricular tachycardia) (HCC)    Orthostatic hypotension    Pacemaker    Pre-diabetes    PVC's (premature ventricular contractions)    Regional enteritis of large intestine (HCC) since 1978   Rheumatoid arthritis(714.0)    dxed in [employer] in 1980s   Second degree AV block, Mobitz type I    Seizures (HCC)    last April 26,2021   Sinus bradycardia    Sleep apnea    SSS (sick sinus syndrome) (HCC)    Subdural hematoma (HCC)    Past Surgical History:  Procedure Laterality Date   BIV PACEMAKER INSERTION CRT-P N/A 03/10/2021   Procedure: BIV PACEMAKER INSERTION CRT-P UPGRADE;   Surgeon: Regan Lemming, MD;  Location: [facility] INVASIVE CV LAB;  Service: Cardiovascular;  Laterality: N/A;   CHOLECYSTECTOMY N/A 01/07/2016   Procedure: LAPAROSCOPIC CHOLECYSTECTOMY ;  Surgeon: Abigail Miyamoto, MD;  Location: [facility] OR;  Service: General;  Laterality: N/A;   cns shunt  02/23/12   COLONOSCOPY W/ POLYPECTOMY  1978   negative 2009, Dr Juanda Chance. Due 2014   CRANIOTOMY  02/02/12   Dr Kelby Aline, [facility]-clipping of aneurysm   CRANIOTOMY  02-02-12   left pterional craniotomy for clipping complex anterior communicating artery aneurysm    HERNIA REPAIR     LEFT HEART CATH AND CORONARY ANGIOGRAPHY N/A 01/12/2018   Procedure: LEFT HEART CATH AND CORONARY ANGIOGRAPHY;  Surgeon: Swazil[place], Peter M, MD;  Location: [facility] INVASIVE CV LAB;  Service: Cardiovascular;  Laterality: N/A;   LEFT HEART CATHETERIZATION WITH CORONARY ANGIOGRAM N/A 11/26/2013   Procedure: LEFT HEART CATHETERIZATION WITH CORONARY ANGIOGRAM;  Surgeon: Marykay Lex, MD;  Location: [facility] CATH LAB;  Service: Cardiovascular;  Laterality: N/A;   NOSE SURGERY     PACEMAKER IMPLANT N/A 07/02/2018   Procedure: PACEMAKER IMPLANT;  Surgeon: Regan Lemming, MD;  Location: [facility] INVASIVE CV LAB;  Service: Cardiovascular;  Laterality:  N/A;   SHOULDER SURGERY  1997   TONSILLECTOMY AND ADENOIDECTOMY     VENTRICULOPERITONEAL SHUNT  02-23-12   INSERTION OF RIGHT FRONTAL VENTRICULOPERITONEAL SHUNT WITH A CODMAN HAKIM PROGRAMMABLE VALVE   Patient Active Problem List   Diagnosis Date Noted   Benign paroxysmal positional vertigo of right ear 03/03/2023   COVID-19 03/03/2023   Abrasion, forearm with infection, left, initial encounter 03/03/2023   Physical deconditioning 12/20/2022   Poor balance 12/20/2022   PVC's (premature ventricular contractions) 09/19/2022   Diplopia 09/19/2022   Parkinsonism 06/19/2022   Primary localized osteoarthrosis of shoulder region 05/10/2022   Recurrent dislocation of shoulder joint 05/10/2022   Regional enteritis (HCC)  05/10/2022   Vestibular hypofunction of left ear 05/10/2022   Sensorineural hearing loss 05/10/2022   Senile osteoporosis 02/03/2022   Pain in right hip 01/05/2022   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   Intertrigo 10/01/2021   HFrEF (heart failure with reduced ejection fraction) (HCC) 09/18/2021   Blepharitis of upper and lower eyelids of both eyes 09/18/2021   Tinea cruris 09/18/2021   Lewy body dementia (HCC) 09/16/2021   Closed fracture of right hip (HCC) 09/16/2021   S/P hip replacement, right  07/2021 09/16/2021   Presence of right artificial hip joint 08/06/2021   Vascular dementia (HCC) 04/29/2021   Multiple falls 04/29/2021   Cough 04/09/2021   Vitamin D deficiency 04/09/2021   Vitamin B1 deficiency 04/09/2021   Right arm pain 01/06/2021   Seizures (HCC)    Muscle cramping 12/30/2019   Dizziness 12/30/2019   Unsteady gait 12/30/2019   Pacemaker 11/27/2019   Fatigue 07/09/2019   SSS (sick sinus syndrome) (HCC) 07/02/2018   Bilateral shoulder pain 06/14/2018   Upper airway cough syndrome vs cough variant asthma 03/20/2018   Acute midline low back pain without sciatica 12/25/2017   Depression 10/17/2017   CAD (coronary artery disease), hx of NSTEMI 08/15/2017   Chest pain 07/17/2017   SOBOE (shortness of breath on exertion) 06/08/2017   Bilateral sensorineural hearing loss 06/01/2017   Nasal turbinate hypertrophy 06/01/2017   Dysphagia 05/31/2017   Osteoporosis 01/30/2017   History of cerebral hemorrhage 12/28/2016   Seborrheic dermatitis 07/25/2016   Cognitive communication deficit 06/28/2016   Hypertensive heart disease 06/28/2016   Vitamin B 12 deficiency 03/16/2016   Confusion 03/13/2016   VP (ventriculoperitoneal) shunt status 02/12/2016   Orthostatic hypotension 01/05/2016   Benign prostatic hyperplasia with urinary obstruction 08/04/2014   Urinary urgency 08/04/2014   Gastroesophageal reflux disease 07/14/2014   Polymyalgia rheumatica (HCC) 05/28/2014    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/2014   SBE (subacute bacterial endocarditis) prophylaxis candidate 11/24/2013   Aphasia 07/17/2013   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 seizures, not intractable, without status epilepticus (HCC) 07/17/2013   Subdural hemorrhage (HCC) 07/17/2013   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HCC) 07/17/2013   Pre-diabetes 03/26/2013   Urinary frequency 10/31/2012   Nocturia 10/31/2012   History of cerebral aneurysm repair 10/18/2012   History of stroke without residual deficits 10/18/2012   Ocular rosacea 09/27/2012   Seizure disorder (HCC) 07/23/2012   Entropion 07/10/2012   Hyperopia with astigmatism 07/10/2012   Combined senile cataract 04/13/2012   Presbyopia 04/13/2012   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) 03/01/2012   Stroke (HCC) 03/01/2012   Cognitive safety issue 02/20/2012   Nonruptured cerebral aneurysm 11/23/2011   Obstructive sleep apnea syndrome 08/09/2011   Hyperlipidemia 02/03/2009   HTN (hypertension) 02/03/2009   History of Crohn's disease 04/17/2008   Allergic rhinitis,  unspecified 07/05/1999   Cataract, right eye 07/05/1999    ONSET DATE: referral date 02/21/23  REFERRING DIAG: R41.841 (ICD-10-CM) - Cognitive communication deficit G31.83,F02.80 (ICD-10-CM) - Lewy body dementia, unspecified dementia severity, unspecified whether behavioral, psychotic, or mood disturbance or anxiety (HCC) I63.512 (ICD-10-CM) - 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 The fall and SDH put him in and out of the ICU, was in the hospital for about a month and then went to a SNF for about a month after that. He used to walk every evening with a group of men, however has been unable to return since the fall and SDH.  Pt was able to dress himself prior to fall, he now requires increased time with buttoning his shirts, wears pull on pants to increase independence, and spouse assists with bathing. Pt continues to report a lot of vision problems - diplopia primarily, did finally see ophthalmologist who placed a Fresnel prism 8 base in on the right lens of his readers. He will continue without prism for distance. He is to f/u in 3 months to check on the prism.   Pt accompanied by: self and family member (spouse and daughter)  PERTINENT HISTORY: Aortic insufficiency, CRT-P pacemaker placement, cerebral aneurysm s/p clipping, cerebral hemorrhage and seizure disorder s/p VP shunt, CHF, CVA, second degree AV block Mobitz type 1 s/p PPM, HTN, HLD, hypertensive heart disease, IBS, lew body dementia, mitral regurgitation, NSVT, orthostatic hypotension, RA, SSS, subdural hematoma, hernia repair, cardiac cath  PRECAUTIONS: Fall, ICD/Pacemaker, and Other: poor vision, STM impairments  WEIGHT BEARING RESTRICTIONS: No  PAIN:  Are you having pain? Yes: NPRS scale: 6/10 Pain location: B knees, R > L Pain description: discomfort Aggravating factors: prolonged sitting Relieving factors: movement  FALLS: Has patient fallen in last 6 months? Yes. Number of falls 2-3, getting in/out of bed or when turning to pick something up   LIVING ENVIRONMENT: Lives with: lives with their family (daughter comes and goes, will spend a few days per week in the home with them). Lives in: House/apartment Stairs: Yes: Internal: 14 steps; and External: 1 step to enter home  Has following equipment at home: Dan Humphreys - 2 wheeled, Environmental consultant - 4  wheeled, Tour manager, and Grab bars  PLOF: Independent and Independent with basic ADLs  PATIENT GOALS: improve endurance and engagement in ADLs  OBJECTIVE:   HAND DOMINANCE: Right  ADLs: Transfers/ambulation related to ADLs: Mod I to supervision with RW Eating: spouse typically cuts foods for him, pt will lean towards food instead of bringing food to mouth, spoon is preferred utensil Grooming: increased time UB Dressing: increased time, is ableto manage buttons LB Dressing: wears disposable underwear and pull on pants for increased ease Toileting: Mod I, does have a grab bar in front that he will pull up on; difficulty with hygiene post BM Bathing: spouse assists with bathing, requires encouragement to bathe Tub Shower transfers: spouse assists with transfers Equipment: Transfer tub bench and Grab bars  IADLs: Medication management: spouse fills pill box and will remind him to take  MOBILITY STATUS:  Needs Assist: Mod I to Supervision with RW and Hx of falls; required min assist to stand from chair without arm rests  POSTURE COMMENTS:  rounded shoulders, forward head, decreased lumbar lordosis, increased thoracic kyphosis, and right head tilt  ACTIVITY TOLERANCE: Activity tolerance: diminished  UPPER EXTREMITY ROM:    Active ROM Right eval Left eval  Shoulder flexion 90 110  Shoulder abduction    Shoulder adduction    Shoulder extension    Shoulder internal rotation 80% 70%  Shoulder external rotation 80% 80%  Elbow flexion    Elbow extension    Wrist flexion    Wrist extension    Wrist ulnar deviation    Wrist radial deviation    Wrist pronation    Wrist supination    (Blank rows = not tested)  HAND FUNCTION: TBD  COORDINATION: TBD  COGNITION: Overall cognitive status: History of cognitive impairments - at baseline Brief Interview for Mental Status:   Memory:  Word Repetition:   "Sock"  "Blue"  "Bed"   Orientation Level:    Year:    2024  Month: Sept  Day of Week: Weds  Memory:  Recall:     Unable to recall "Sock"  with cue   Able to recall  "Blue"  without cue   Able to recall  "Bed"   with cue     No Recall   VISION: Subjective report: diplopia.  Recently went to ophthalmologist who applied a Fresnel prism 8 base in on the right lens of his readers. Baseline vision: Wears glasses for reading only  VISION ASSESSMENT: Impaired To be further assessed in functional context   TODAY'S TREATMENT:                                                                        DATE: 03/15/23 NA, eval only  PATIENT EDUCATION: Education details: Educated on role and purpose of OT as well as potential interventions and goals for therapy based on initial evaluation findings. Person educated: Patient, Spouse, and Child(ren) Education method: Explanation Education comprehension: verbalized understanding and needs further education  HOME EXERCISE PROGRAM: TBD   GOALS: Goals reviewed with patient? Yes  SHORT TERM GOALS: Target date: 04/14/23  Pt and caregiver will be independent with gentle ROM HEP for improved functional use of BUE. Baseline: Goal status: INITIAL  2.  Pt will verbalize understanding of task modifications and/or potential AE needs to increase ease, safety, and independence w/ ADLs Baseline:  Goal status: INITIAL  3.  Pt will verbalize understanding of use of new glasses with Fresnel lenses for increased visual perception as needed for ADLs and IADLs. Baseline:  Goal status: INITIAL   LONG TERM GOALS: Target date: 05/12/23  Pt will demonstrate and/or report ability to complete LB dressing to include footwear at Mod I level utilizing adaptive techniques and/or AE PRN. Baseline:  Goal status: INITIAL  2.  Pt will be able to complete toileting hygiene at Mod I level with adaptive techniques and/or AE PRN. Baseline:  Goal status: INITIAL  3.  Pt will demonstrate improved internal rotation as  needed for clothing management and hygiene post toileting. Baseline:  Goal status: INITIAL  4.  Pt will demonstrate improved sustained attention to  task with ability to complete table top task for 10 mins with 1 or fewer cues for attention to task. Baseline:  Goal status: INITIAL  ASSESSMENT:  CLINICAL IMPRESSION: Patient is a 77 y.o. male who was seen today for occupational therapy evaluation for dementia, physical deconditioning, impaired vision, and poor balance due to h/o SDH. Pt currently lives with spouse with adult daughter present a few days per week in a 2 story home with pt able to reside on main floor. Pt will benefit from skilled occupational therapy services to address strength and coordination, ROM, pain management, balance, GM/FM control, cognition, safety awareness, introduction of compensatory strategies/AE prn, visual-perception, and implementation of an HEP to improve participation and safety during ADLs and IADLs.    PERFORMANCE DEFICITS: in functional skills including ADLs, IADLs, ROM, pain, flexibility, Gross motor control, balance, body mechanics, endurance, and decreased knowledge of use of DME, cognitive skills including attention, memory, problem solving, and safety awareness, and psychosocial skills including coping strategies, environmental adaptation, and habits.   IMPAIRMENTS: are limiting patient from ADLs and IADLs.   CO-MORBIDITIES: may have co-morbidities  that affects occupational performance. Patient will benefit from skilled OT to address above impairments and improve overall function.  MODIFICATION OR ASSISTANCE TO COMPLETE EVALUATION: No modification of tasks or assist necessary to complete an evaluation.  OT OCCUPATIONAL PROFILE AND HISTORY: Problem focused assessment: Including review of records relating to presenting problem.  CLINICAL DECISION MAKING: LOW - limited treatment options, no task modification necessary  REHAB POTENTIAL:  Good  EVALUATION COMPLEXITY: Low    PLAN:  OT FREQUENCY: 2x/week  OT DURATION: 8 weeks  PLANNED INTERVENTIONS: self care/ADL training, therapeutic exercise, therapeutic activity, neuromuscular re-education, passive range of motion, balance training, functional mobility training, ultrasound, compression bandaging, moist heat, cryotherapy, patient/family education, cognitive remediation/compensation, visual/perceptual remediation/compensation, psychosocial skills training, energy conservation, coping strategies training, and DME and/or AE instructions  RECOMMENDED OTHER SERVICES: NA  CONSULTED AND AGREED WITH PLAN OF CARE: Patient and family member/caregiver  PLAN FOR NEXT SESSION: Assess coordination and further assess vision functionally   Axelle Szwed, OTR/L 03/15/2023, 3:26 PM

## 2023-03-15 NOTE — Therapy (Unsigned)
OUTPATIENT SPEECH LANGUAGE PATHOLOGY [facility]'S EVALUATION   Patient Name: Jeff Weaver MRN: [id number] DOB:1946/05/29, 77 y.o., male Today's Date: 03/16/2023  PCP: Cheryll Cockayne, MD REFERRING PROVIDER: Same as PCP  END OF SESSION:  End of Session - 03/16/23 0819     Visit Number 1    Number of Visits 25    Date for SLP Re-Evaluation 06/13/23    SLP Start Time 1450    SLP Stop Time  1531    SLP Time Calculation (min) 41 min    Activity Tolerance Patient tolerated treatment well             Past Medical History:  Diagnosis Date   Aortic insufficiency    Atypical chest pain    Cardiac resynchronization therapy pacemaker (CRT-P) in place    Cerebral aneurysm    a. s/p clipping at [facility] in 8/13 c/b short-term memory loss, cerebral hemorrhage and seizure disorder s/p VP shunt.   Cerebral hemorrhage (HCC)    Chronic combined systolic and diastolic CHF (congestive heart failure) (HCC)    CVA (cerebral infarction)    Diverticulosis    Enteritis (regional)    Dr Juanda Chance   First degree AV block    GI bleed    HTN (hypertension)    Hyperlipidemia    Hypertensive heart disease    IBS (irritable bowel syndrome)    Lewy body dementia (HCC)    Mild CAD    Cardiac cath 01/12/18 showed minimal nonobstructive CAD, normal LVEF, normal LVEDP.   Mild dilation of ascending aorta (HCC)    Mitral regurgitation    NSVT (nonsustained ventricular tachycardia) (HCC)    Orthostatic hypotension    Pacemaker    Pre-diabetes    PVC's (premature ventricular contractions)    Regional enteritis of large intestine (HCC) since 1978   Rheumatoid arthritis(714.0)    dxed in [employer] in 1980s   Second degree AV block, Mobitz type I    Seizures (HCC)    last April 26,2021   Sinus bradycardia    Sleep apnea    SSS (sick sinus syndrome) (HCC)    Subdural hematoma (HCC)    Past Surgical History:  Procedure Laterality Date   BIV PACEMAKER INSERTION CRT-P N/A 03/10/2021   Procedure: BIV PACEMAKER  INSERTION CRT-P UPGRADE;  Surgeon: Regan Lemming, MD;  Location: [facility] INVASIVE CV LAB;  Service: Cardiovascular;  Laterality: N/A;   CHOLECYSTECTOMY N/A 01/07/2016   Procedure: LAPAROSCOPIC CHOLECYSTECTOMY ;  Surgeon: Abigail Miyamoto, MD;  Location: [facility] OR;  Service: General;  Laterality: N/A;   cns shunt  02/23/12   COLONOSCOPY W/ POLYPECTOMY  1978   negative 2009, Dr Juanda Chance. Due 2014   CRANIOTOMY  02/02/12   Dr Kelby Aline, [facility]-clipping of aneurysm   CRANIOTOMY  02-02-12   left pterional craniotomy for clipping complex anterior communicating artery aneurysm    HERNIA REPAIR     LEFT HEART CATH AND CORONARY ANGIOGRAPHY N/A 01/12/2018   Procedure: LEFT HEART CATH AND CORONARY ANGIOGRAPHY;  Surgeon: Swaziland, Peter M, MD;  Location: [facility] INVASIVE CV LAB;  Service: Cardiovascular;  Laterality: N/A;   LEFT HEART CATHETERIZATION WITH CORONARY ANGIOGRAM N/A 11/26/2013   Procedure: LEFT HEART CATHETERIZATION WITH CORONARY ANGIOGRAM;  Surgeon: Marykay Lex, MD;  Location: [facility] CATH LAB;  Service: Cardiovascular;  Laterality: N/A;   NOSE SURGERY     PACEMAKER IMPLANT N/A 07/02/2018   Procedure: PACEMAKER IMPLANT;  Surgeon: Regan Lemming, MD;  Location: [facility] INVASIVE CV LAB;  Service: Cardiovascular;  Laterality: N/A;   SHOULDER SURGERY  1997   TONSILLECTOMY AND ADENOIDECTOMY     VENTRICULOPERITONEAL SHUNT  02-23-12   INSERTION OF RIGHT FRONTAL VENTRICULOPERITONEAL SHUNT WITH A CODMAN HAKIM PROGRAMMABLE VALVE   Patient Active Problem List   Diagnosis Date Noted   Benign paroxysmal positional vertigo of right ear 03/03/2023   COVID-19 03/03/2023   Abrasion, forearm with infection, left, initial encounter 03/03/2023   Physical deconditioning 12/20/2022   Poor balance 12/20/2022   PVC's (premature ventricular contractions) 09/19/2022   Diplopia 09/19/2022   Parkinsonism 06/19/2022   Primary localized osteoarthrosis of shoulder region 05/10/2022   Recurrent dislocation of shoulder joint 05/10/2022    Regional enteritis (HCC) 05/10/2022   Vestibular hypofunction of left ear 05/10/2022   Sensorineural hearing loss 05/10/2022   Senile osteoporosis 02/03/2022   Pain in right hip 01/05/2022   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   Intertrigo 10/01/2021   HFrEF (heart failure with reduced ejection fraction) (HCC) 09/18/2021   Blepharitis of upper and lower eyelids of both eyes 09/18/2021   Tinea cruris 09/18/2021   Lewy body dementia (HCC) 09/16/2021   Closed fracture of right hip (HCC) 09/16/2021   S/P hip replacement, right  07/2021 09/16/2021   Presence of right artificial hip joint 08/06/2021   Vascular dementia (HCC) 04/29/2021   Multiple falls 04/29/2021   Cough 04/09/2021   Vitamin D deficiency 04/09/2021   Vitamin B1 deficiency 04/09/2021   Right arm pain 01/06/2021   Seizures (HCC)    Muscle cramping 12/30/2019   Dizziness 12/30/2019   Unsteady gait 12/30/2019   Pacemaker 11/27/2019   Fatigue 07/09/2019   SSS (sick sinus syndrome) (HCC) 07/02/2018   Bilateral shoulder pain 06/14/2018   Upper airway cough syndrome vs cough variant asthma 03/20/2018   Acute midline low back pain without sciatica 12/25/2017   Depression 10/17/2017   CAD (coronary artery disease), hx of NSTEMI 08/15/2017   Chest pain 07/17/2017   SOBOE (shortness of breath on exertion) 06/08/2017   Bilateral sensorineural hearing loss 06/01/2017   Nasal turbinate hypertrophy 06/01/2017   Dysphagia 05/31/2017   Osteoporosis 01/30/2017   History of cerebral hemorrhage 12/28/2016   Seborrheic dermatitis 07/25/2016   Cognitive communication deficit 06/28/2016   Hypertensive heart disease 06/28/2016   Vitamin B 12 deficiency 03/16/2016   Confusion 03/13/2016   VP (ventriculoperitoneal) shunt status 02/12/2016   Orthostatic hypotension 01/05/2016   Benign prostatic hyperplasia with urinary obstruction 08/04/2014   Urinary urgency 08/04/2014   Gastroesophageal reflux disease 07/14/2014   Polymyalgia rheumatica  (HCC) 05/28/2014   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/2014   SBE (subacute bacterial endocarditis) prophylaxis candidate 11/24/2013   Aphasia 07/17/2013   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 seizures, not intractable, without status epilepticus (HCC) 07/17/2013   Subdural hemorrhage (HCC) 07/17/2013   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HCC) 07/17/2013   Pre-diabetes 03/26/2013   Urinary frequency 10/31/2012   Nocturia 10/31/2012   History of cerebral aneurysm repair 10/18/2012   History of stroke without residual deficits 10/18/2012   Ocular rosacea 09/27/2012   Seizure disorder (HCC) 07/23/2012   Entropion 07/10/2012   Hyperopia with astigmatism 07/10/2012   Combined senile cataract 04/13/2012   Presbyopia 04/13/2012   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) 03/01/2012   Stroke (HCC) 03/01/2012   Cognitive safety issue 02/20/2012   Nonruptured cerebral aneurysm 11/23/2011   Obstructive sleep apnea syndrome 08/09/2011   Hyperlipidemia 02/03/2009   HTN (hypertension) 02/03/2009   History of Crohn's disease 04/17/2008   Allergic  rhinitis, unspecified 07/05/1999   Cataract, right eye 07/05/1999    ONSET DATE: chronic disease- script dated 02/21/23  REFERRING DIAG: R41.841 (ICD-10-CM) - Cognitive communication deficit G31.83,F02.80 (ICD-10-CM) - Lewy body dementia, unspecified dementia severity, unspecified whether behavioral, psychotic, or mood disturbance or anxiety (HCC) I63.512 (ICD-10-CM) - 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  PAIN:  Are you having pain? Yes: NPRS scale: 6/10 Pain location: bil knees  FALLS: Has patient fallen in last 6 months?  See PT evaluation for details  LIVING ENVIRONMENT: Lives with: lives with their spouse Lives in: House/apartment  PLOF:  Level of assistance: Needed assistance with ADLs, Needed assistance with IADLS Employment: Retired  PATIENT GOALS: Increase speech clarity, assistance with food clearance  OBJECTIVE:   COGNITION: Overall cognitive status: Impaired Areas of impairment: Memory Comments: Daughter corrected pt x4 in session today re: recent history about medical events. Pt told SLP, "My short term memory isn't what it was" on the way walking to PT.  MOTOR SPEECH: Overall motor speech: impaired Level of impairment: Word Respiration: thoracic breathing Phonation: low vocal intensity Resonance: WFL Articulation: Appears intact Intelligibility: Intelligible Effective technique: increased vocal intensity  ORAL MOTOR EXAMINATION: Overall status: Impaired: Labial: Bilateral (ROM, Strength, and Coordination) Lingual: Bilateral (ROM, Strength, and Coordination) Facial: Bilateral (ROM) Comments: Pt's diadochokinetic task was completed fairly well for articulatory movement, volume was mid 60s dB.  OBJECTIVE VOICE ASSESSMENT: Sustained "ah" maximum phonation time: 12 seconds Sustained "ah" loudness average: 65 dB Oral reading (passage) loudness average: 64 dB Oral reading loudness range: 61-68 dB Conversational loudness average: 63 dB Conversational loudness range: 58-67 dB Voice quality: low vocal intensity Stimulability trials: Given SLP modeling and occasional mod cues, loudness average increased to 83dB (range of 76 to 86) at loud "ah" level. With question and answer responses pt req'd min A usually to use louder speech, loudness average measured at 70dB with range of 65-73dB Comments: Pt's hearing acuity noted as decreased. Pt  wears aids and has had them checked recently, according to [facility], with report they are working as they should.  Completed audio recording of patients baseline voice without cueing from SLP: No  Pt does report difficulty with swallowing which does warrant further evaluation. Pt had recent MBS at [facility] in April 2024 with results below. According to daughter Natalia Leatherwood, pt is scheduled for follow up MBS in October, 2024. Pt would like to pursue future MBSs locally rather than traveling to [facility] for this exam. Patient with mild oropharyngeal dysphagia, like secondary to age-related changes to the swallowing mechanism and generalized deconditioning. Dysphagia is primarily characterized by mistiming of the pharyngeal swallow and reduced supraglottic closure, resulting in penetration of thin liquids during the swallow with intermittent silent aspiration on progression of cord-level residual. Cued post-swallow throat clearing was effective at mitigating penetrants. PAS scores below. Bolus propulsion and UES distension is largely intact. Based on today's examination, recommend patient continue regular solids/thin liquids, pills one at a time, with adherence to aspiration precautions and swallowing strategies including alternation of liquids and solids, SINGLE sips at a time, and regular post-swallow throat clearing after  the swallow. Provided risks and adverse outcomes associated with dysphagia and aspiration (i.e possible life-threatening pneumonia) and rationale for adherence to management recommendations. Did provide signs of worsening dysphagia and return to clinic criteria. Will re-evaluate in 6 months, certainly sooner if indicated. Patient and family verbalized understanding of all exam results and recommendations.  Recommendations: Diet: Regular Aspiration precautions:  sit upright during all PO (as close to 90 degrees as possible) alternate bites/sips cough/clear throat regularly during the  meal SINGLE sips  Medications: one at a time   PATIENT REPORTED OUTCOME MEASURES (PROM): Communication Effectiveness Survey: to be provided in first 1-2 sessions  TODAY'S TREATMENT:                                                                                                                                         DATE:  03/15/23 (eval): n/a  PATIENT EDUCATION: Education details: Family members may need to use certain cues for pt to talk louder at home, SLP goal to improve pt's speech volume/clarity, MBS basics and how referral will be completed Person educated: Patient and Child(ren) Education method: Explanation Education comprehension: verbalized understanding and needs further education  HOME EXERCISE PROGRAM: TBD   GOALS: Goals reviewed with patient? Yes (in general)  SHORT TERM GOALS: Target date: 04/14/23  Pt will complete MBS to assess current swallowing status and assess safe swallow strategies Baseline: Goal status: INITIAL  2.  Pt will maintain average mid-upper 80s for loud /a/ in 3 sessions Baseline:  Goal status: INITIAL  3.  Pt will use speech volume average upper 60s dB for 5 minutes simple conversation in two sessions Baseline:  Goal status: INITIAL  4.  pt will generate abdominal breathing at rest (seated position) 80% of the time over 2 sessions Baseline:  Goal status: INITIAL   LONG TERM GOALS: Target date: 05/12/23  Pt will improve PROM measure scores in the last 1-2 sessions Baseline:  Goal status: INITIAL  2.  Pt will maintain average upper 80s for loud /a/ in 3 sessions Baseline:  Goal status: INITIAL  3.  Pt will use speech volume average low 70s dB for 8 minutes simple-mod complex  conversation with occasional verbal cues in two sessions Baseline:  Goal status: INITIAL  4.   Pt will generate abdominal breathing 60% of the time in 8 minutes simple-mod complex conversation over 2 sessions Baseline:  Goal status:  INITIAL   ASSESSMENT:  CLINICAL IMPRESSION: Patient is a 77 y.o. M who was seen today for assessment of speech intelligibility in light of Parkinsons disease. Pt was scheduled for follow up MBS at [facility] in October and would like to pursue that assessment locally. SLP to suggest order be sent from referring MD for MBS. Goals for swallowing TBD following that assessment. Today, pt was stimulable for incr'd speech volume at question and answer level. "They are always telling me to speak up,"  pt stated to SLP. Pt's OT to target compensations for cognition.  OBJECTIVE IMPAIRMENTS: Objective impairments include memory, dysarthria, and dysphagia. These impairments are limiting patient from ADLs/IADLs, effectively communicating at home and in community, and safety when swallowing.Factors affecting potential to achieve goals and functional outcome are ability to learn/carryover information and co-morbidities.. Patient will benefit from skilled SLP services to address above impairments and improve overall function.  REHAB POTENTIAL: Good  PLAN:  SLP FREQUENCY: 2x/week  SLP DURATION: 8 weeks  PLANNED INTERVENTIONS: Aspiration precaution training, Pharyngeal strengthening exercises, Diet toleration management , Environmental controls, Trials of upgraded texture/liquids, Internal/external aids, Oral motor exercises, Functional tasks, SLP instruction and feedback, Compensatory strategies, and Patient/family education    [facility], CCC-SLP 03/16/2023, 8:28 AM

## 2023-03-17 ENCOUNTER — Telehealth (HOSPITAL_COMMUNITY): Payer: Self-pay | Admitting: *Deleted

## 2023-03-17 ENCOUNTER — Encounter: Payer: Self-pay | Admitting: Physician Assistant

## 2023-03-17 ENCOUNTER — Other Ambulatory Visit: Payer: Self-pay | Admitting: Physician Assistant

## 2023-03-17 MED ORDER — ROSUVASTATIN CALCIUM 5 MG PO TABS: 5.0000 mg | ORAL_TABLET | ORAL | 3 refills | Status: DC

## 2023-03-17 NOTE — Telephone Encounter (Signed)
Attempted to contact patient to schedule OP MBS. Left VM. RKEEL

## 2023-03-17 NOTE — Progress Notes (Signed)
Daughter Natalia Leatherwood came in for her visit today and relayed need for rosuvastatin refills for Mr. Jeff Weaver. Sent in as requested.

## 2023-03-20 ENCOUNTER — Other Ambulatory Visit: Payer: Medicare Other

## 2023-03-20 ENCOUNTER — Encounter: Payer: Self-pay | Admitting: Internal Medicine

## 2023-03-20 ENCOUNTER — Ambulatory Visit (INDEPENDENT_AMBULATORY_CARE_PROVIDER_SITE_OTHER): Payer: Medicare Other | Admitting: Internal Medicine

## 2023-03-20 VITALS — BP 136/80 | HR 60 | Ht 68.0 in | Wt 244.0 lb

## 2023-03-20 DIAGNOSIS — R197 Diarrhea, unspecified: Secondary | ICD-10-CM

## 2023-03-20 DIAGNOSIS — K5010 Crohn's disease of large intestine without complications: Secondary | ICD-10-CM

## 2023-03-20 NOTE — Patient Instructions (Signed)
Your provider has requested that you go to the basement level for lab work before leaving today. Press "B" on the elevator. The lab is located at the first door on the left as you exit the elevator.  Dr Rhea Belton recommends that you complete a bowel purge (to clean out your bowels). Please do the following on Saturday: Purchase a bottle of Miralax over the counter as well as a box of 5 mg dulcolax tablets. Take 4 dulcolax tablets. Wait 1 hour. You will then drink 6-8 capfuls of Miralax mixed in an adequate amount of water/juice/gatorade (you may choose which of these liquids to drink) over the next 2-3 hours. You should expect results within 1 to 6 hours after completing the bowel purge.  _______________________________________________________  If your blood pressure at your visit was 140/90 or greater, please contact your primary care physician to follow up on this.  _______________________________________________________  If you are age 64 or older, your body mass index should be between 23-30. Your Body mass index is 37.1 kg/m. If this is out of the aforementioned range listed, please consider follow up with your Primary Care Provider.  If you are age 3 or younger, your body mass index should be between 19-25. Your Body mass index is 37.1 kg/m. If this is out of the aformentioned range listed, please consider follow up with your Primary Care Provider.   ________________________________________________________  The [facility] providers would like to encourage you to use [facility] to communicate with providers for non-urgent requests or questions.  Due to long hold times on the telephone, sending your provider a message by [facility] may be a faster and more efficient way to get a response.  Please allow 48 business hours for a response.  Please remember that this is for non-urgent requests.  _______________________________________________________

## 2023-03-20 NOTE — Progress Notes (Signed)
Subjective:    Patient ID: Jeff Weaver, male    DOB: 12-09-1945, 76 y.o.   MRN: [id number]  HPI Jeff Weaver is a 77 year old male with a history of remote Crohn's disease, adenomatous colon polyps, B12 deficiency, GERD, cerebral aneurysm status post clipping with subsequent hemorrhage, CAD, systolic heart failure, sleep apnea, Lewy body dementia, PMR who is seen for follow-up.  He is here today with his wife.  He was seen on 01/12/2023 by Berlinda Last, PA-C  At the time of his last visit it was suggested that he likely had overflow diarrhea based his clinical symptoms and an x-ray showing stool throughout the colon.  He has not been able to do the stool studies or bowel purge because of scheduling issues.  He has continued to have intermittent loose stools several times per day.  He can have accidents and fecal urgency.  No bleeding.  Some abdominal cramps.  Loperamide is given by his wife prophylactically if he is eating ice cream or salads because she feels like they may be trigger foods.  Good appetite.   Review of Systems As per HPI, otherwise negative  Current Medications, Allergies, Past Medical History, Past Surgical History, Family History and Social History were reviewed in [employer] record.    Objective:   Physical Exam BP 136/80   Pulse 60   Ht 5\' 8"  (1.727 m)   Wt 244 lb (110.7 kg)   BMI 37.10 kg/m  Gen: awake, alert, NAD HEENT: anicteric  Abd: soft, obese, NT/ND, +BS throughout Ext: no c/c, trace pretibial edema Neuro: nonfocal      Assessment & Plan:  77 year old male with a history of remote Crohn's disease, adenomatous colon polyps, B12 deficiency, GERD, cerebral aneurysm status post clipping with subsequent hemorrhage, CAD, systolic heart failure, sleep apnea, Lewy body dementia, PMR who is seen for follow-up.   Diarrhea, presumed overflow --I would like to objectively measure for inflammation in the stool.  I do think the overflow  diarrhea diagnosis is correct.  We have discussed this today.  After long discussion we will proceed as below.  His Crohn's disease is more remote and has not been seen at his last 3 colonoscopies. -- Fecal calprotectin -- Stool for ova and parasite and stool culture plus C. Difficile -- After submitting stool studies then perform MiraLAX purge as previously recommended; he will do this on a Saturday so that he has time to complete the the purge and this will not interfere with his neuro physical therapy. -- Cannot exclude medication related change in bowel habits -- Could consider cross-sectional imaging but suspicion for significant small or large bowel pathology is low at this time -- Colonoscopy was performed 3 years ago  40 minutes total spent today including patient facing time, coordination of care, reviewing medical history/procedures/pertinent radiology studies, and documentation of the encounter.

## 2023-03-21 ENCOUNTER — Ambulatory Visit: Payer: Medicare Other | Admitting: Physical Therapy

## 2023-03-22 ENCOUNTER — Ambulatory Visit: Payer: Medicare Other | Admitting: Occupational Therapy

## 2023-03-22 ENCOUNTER — Other Ambulatory Visit: Payer: Medicare Other

## 2023-03-22 ENCOUNTER — Ambulatory Visit: Payer: Medicare Other

## 2023-03-22 DIAGNOSIS — R197 Diarrhea, unspecified: Secondary | ICD-10-CM

## 2023-03-22 DIAGNOSIS — R2681 Unsteadiness on feet: Secondary | ICD-10-CM

## 2023-03-22 DIAGNOSIS — K5010 Crohn's disease of large intestine without complications: Secondary | ICD-10-CM | POA: Diagnosis not present

## 2023-03-22 DIAGNOSIS — R41840 Attention and concentration deficit: Secondary | ICD-10-CM | POA: Diagnosis not present

## 2023-03-22 DIAGNOSIS — R471 Dysarthria and anarthria: Secondary | ICD-10-CM

## 2023-03-22 DIAGNOSIS — R2689 Other abnormalities of gait and mobility: Secondary | ICD-10-CM | POA: Diagnosis not present

## 2023-03-22 DIAGNOSIS — R1310 Dysphagia, unspecified: Secondary | ICD-10-CM

## 2023-03-22 DIAGNOSIS — M6281 Muscle weakness (generalized): Secondary | ICD-10-CM

## 2023-03-22 DIAGNOSIS — R293 Abnormal posture: Secondary | ICD-10-CM | POA: Diagnosis not present

## 2023-03-22 DIAGNOSIS — I69918 Other symptoms and signs involving cognitive functions following unspecified cerebrovascular disease: Secondary | ICD-10-CM | POA: Diagnosis not present

## 2023-03-22 NOTE — Therapy (Signed)
OUTPATIENT SPEECH LANGUAGE PATHOLOGY [facility] EVALUATION   Patient Name: Jeff Weaver MRN: [id number] DOB:11/27/1945, 77 y.o., male Today's Date: 03/22/2023  PCP: Cheryll Cockayne, MD REFERRING PROVIDER: Same as PCP  END OF SESSION:  End of Session - 03/22/23 1132     Visit Number 2    Number of Visits 25    Date for SLP Re-Evaluation 06/13/23    SLP Start Time 1022   Pt in restroom at 1017   SLP Stop Time  1100    SLP Time Calculation (min) 38 min    Activity Tolerance Patient tolerated treatment well             Past Medical History:  Diagnosis Date   Aortic insufficiency    Atypical chest pain    Cardiac resynchronization therapy pacemaker (CRT-P) in place    Cerebral aneurysm    a. s/p clipping at [facility] in 8/13 c/b short-term memory loss, cerebral hemorrhage and seizure disorder s/p VP shunt.   Cerebral hemorrhage (HCC)    Chronic combined systolic and diastolic CHF (congestive heart failure) (HCC)    CVA (cerebral infarction)    Diverticulosis    Enteritis (regional)    Dr Juanda Chance   First degree AV block    GI bleed    HTN (hypertension)    Hyperlipidemia    Hypertensive heart disease    IBS (irritable bowel syndrome)    Lewy body dementia (HCC)    Mild CAD    Cardiac cath 01/12/18 showed minimal nonobstructive CAD, normal LVEF, normal LVEDP.   Mild dilation of ascending aorta (HCC)    Mitral regurgitation    NSVT (nonsustained ventricular tachycardia) (HCC)    Orthostatic hypotension    Pacemaker    Pre-diabetes    PVC's (premature ventricular contractions)    Regional enteritis of large intestine (HCC) since 1978   Rheumatoid arthritis(714.0)    dxed in [employer] in 1980s   Second degree AV block, Mobitz type I    Seizures (HCC)    last April 26,2021   Sinus bradycardia    Sleep apnea    SSS (sick sinus syndrome) (HCC)    Subdural hematoma (HCC)    Past Surgical History:  Procedure Laterality Date   BIV PACEMAKER INSERTION CRT-P N/A 03/10/2021    Procedure: BIV PACEMAKER INSERTION CRT-P UPGRADE;  Surgeon: Regan Lemming, MD;  Location: [facility] INVASIVE CV LAB;  Service: Cardiovascular;  Laterality: N/A;   CHOLECYSTECTOMY N/A 01/07/2016   Procedure: LAPAROSCOPIC CHOLECYSTECTOMY ;  Surgeon: Abigail Miyamoto, MD;  Location: [facility] OR;  Service: General;  Laterality: N/A;   cns shunt  02/23/12   COLONOSCOPY W/ POLYPECTOMY  1978   negative 2009, Dr Juanda Chance. Due 2014   CRANIOTOMY  02/02/12   Dr Kelby Aline, [facility]-clipping of aneurysm   CRANIOTOMY  02-02-12   left pterional craniotomy for clipping complex anterior communicating artery aneurysm    HERNIA REPAIR     LEFT HEART CATH AND CORONARY ANGIOGRAPHY N/A 01/12/2018   Procedure: LEFT HEART CATH AND CORONARY ANGIOGRAPHY;  Surgeon: Swaziland, Peter M, MD;  Location: [facility] INVASIVE CV LAB;  Service: Cardiovascular;  Laterality: N/A;   LEFT HEART CATHETERIZATION WITH CORONARY ANGIOGRAM N/A 11/26/2013   Procedure: LEFT HEART CATHETERIZATION WITH CORONARY ANGIOGRAM;  Surgeon: Marykay Lex, MD;  Location: [facility] CATH LAB;  Service: Cardiovascular;  Laterality: N/A;   NOSE SURGERY     PACEMAKER IMPLANT N/A 07/02/2018   Procedure: PACEMAKER IMPLANT;  Surgeon: Regan Lemming, MD;  Location: [facility]  INVASIVE CV LAB;  Service: Cardiovascular;  Laterality: N/A;   SHOULDER SURGERY  1997   TONSILLECTOMY AND ADENOIDECTOMY     VENTRICULOPERITONEAL SHUNT  02-23-12   INSERTION OF RIGHT FRONTAL VENTRICULOPERITONEAL SHUNT WITH A CODMAN HAKIM PROGRAMMABLE VALVE   Patient Active Problem List   Diagnosis Date Noted   Benign paroxysmal positional vertigo of right ear 03/03/2023   COVID-19 03/03/2023   Abrasion, forearm with infection, left, initial encounter 03/03/2023   Physical deconditioning 12/20/2022   Poor balance 12/20/2022   PVC's (premature ventricular contractions) 09/19/2022   Diplopia 09/19/2022   Parkinsonism 06/19/2022   Primary localized osteoarthrosis of shoulder region 05/10/2022   Recurrent dislocation of  shoulder joint 05/10/2022   Regional enteritis (HCC) 05/10/2022   Vestibular hypofunction of left ear 05/10/2022   Sensorineural hearing loss 05/10/2022   Senile osteoporosis 02/03/2022   Pain in right hip 01/05/2022   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   Intertrigo 10/01/2021   HFrEF (heart failure with reduced ejection fraction) (HCC) 09/18/2021   Blepharitis of upper and lower eyelids of both eyes 09/18/2021   Tinea cruris 09/18/2021   Lewy body dementia (HCC) 09/16/2021   Closed fracture of right hip (HCC) 09/16/2021   S/P hip replacement, right  07/2021 09/16/2021   Presence of right artificial hip joint 08/06/2021   Vascular dementia (HCC) 04/29/2021   Multiple falls 04/29/2021   Cough 04/09/2021   Vitamin D deficiency 04/09/2021   Vitamin B1 deficiency 04/09/2021   Right arm pain 01/06/2021   Seizures (HCC)    Muscle cramping 12/30/2019   Dizziness 12/30/2019   Unsteady gait 12/30/2019   Pacemaker 11/27/2019   Fatigue 07/09/2019   SSS (sick sinus syndrome) (HCC) 07/02/2018   Bilateral shoulder pain 06/14/2018   Upper airway cough syndrome vs cough variant asthma 03/20/2018   Acute midline low back pain without sciatica 12/25/2017   Depression 10/17/2017   CAD (coronary artery disease), hx of NSTEMI 08/15/2017   Chest pain 07/17/2017   SOBOE (shortness of breath on exertion) 06/08/2017   Bilateral sensorineural hearing loss 06/01/2017   Nasal turbinate hypertrophy 06/01/2017   Dysphagia 05/31/2017   Osteoporosis 01/30/2017   History of cerebral hemorrhage 12/28/2016   Seborrheic dermatitis 07/25/2016   Cognitive communication deficit 06/28/2016   Hypertensive heart disease 06/28/2016   Vitamin B 12 deficiency 03/16/2016   Confusion 03/13/2016   VP (ventriculoperitoneal) shunt status 02/12/2016   Orthostatic hypotension 01/05/2016   Benign prostatic hyperplasia with urinary obstruction 08/04/2014   Urinary urgency 08/04/2014   Gastroesophageal reflux disease  07/14/2014   Polymyalgia rheumatica (HCC) 05/28/2014   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/2014   SBE (subacute bacterial endocarditis) prophylaxis candidate 11/24/2013   Aphasia 07/17/2013   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 seizures, not intractable, without status epilepticus (HCC) 07/17/2013   Subdural hemorrhage (HCC) 07/17/2013   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HCC) 07/17/2013   Pre-diabetes 03/26/2013   Urinary frequency 10/31/2012   Nocturia 10/31/2012   History of cerebral aneurysm repair 10/18/2012   History of stroke without residual deficits 10/18/2012   Ocular rosacea 09/27/2012   Seizure disorder (HCC) 07/23/2012   Entropion 07/10/2012   Hyperopia with astigmatism 07/10/2012   Combined senile cataract 04/13/2012   Presbyopia 04/13/2012   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) 03/01/2012   Stroke (HCC) 03/01/2012   Cognitive safety issue 02/20/2012   Nonruptured cerebral aneurysm 11/23/2011   Obstructive sleep apnea syndrome 08/09/2011   Hyperlipidemia 02/03/2009   HTN (hypertension) 02/03/2009   History  of Crohn's disease 04/17/2008   Allergic rhinitis, unspecified 07/05/1999   Cataract, right eye 07/05/1999    ONSET DATE: chronic disease- script dated 02/21/23  REFERRING DIAG: R41.841 (ICD-10-CM) - Cognitive communication deficit G31.83,F02.80 (ICD-10-CM) - Lewy body dementia, unspecified dementia severity, unspecified whether behavioral, psychotic, or mood disturbance or anxiety (HCC) I63.512 (ICD-10-CM) - 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  PAIN:  Are you having pain? Yes: NPRS scale: 6/10 Pain location: bil knees  FALLS: Has patient fallen in last 6 months?  See PT evaluation for details   PATIENT GOALS: Increase speech clarity, assistance with food clearance  OBJECTIVE:   MBS [facility] April 2024: Patient with mild oropharyngeal dysphagia, like secondary to age-related changes to the swallowing mechanism and generalized deconditioning. Dysphagia is primarily characterized by mistiming of the pharyngeal swallow and reduced supraglottic closure, resulting in penetration of thin liquids during the swallow with intermittent silent aspiration on progression of cord-level residual. Cued post-swallow throat clearing was effective at mitigating penetrants. PAS scores below. Bolus propulsion and UES distension is largely intact. Based on today's examination, recommend patient continue regular solids/thin liquids, pills one at a time, with adherence to aspiration precautions and swallowing strategies including alternation of liquids and solids, SINGLE sips at a time, and regular post-swallow throat clearing after the swallow. Provided risks and adverse outcomes associated with dysphagia and aspiration (i.e possible life-threatening pneumonia) and rationale for adherence to management recommendations. Did provide signs of worsening dysphagia and return to clinic criteria. Will re-evaluate in 6 months, certainly sooner if indicated. Patient and family verbalized understanding of all exam results and recommendations.  Recommendations: Diet: Regular Aspiration precautions:  sit upright during all PO (as close to 90 degrees as possible) alternate bites/sips cough/clear throat regularly during the meal SINGLE sips  Medications: one at a time   PATIENT REPORTED OUTCOME MEASURES (PROM): Communication Effectiveness Survey: to be provided in first 1-2 sessions  TODAY'S TREATMENT:                                                                                                                                          DATE:  03/22/23: Pt needs CES next session.  SLP introduced pt and wife to everyday sentences. Pt thought of 5 and wife generated 4. SLP instructed pt to recite these twice a day in a loud, strong voice, and explained rationale for pt and wife. Pt questioned rationale and SLP drew a diagram with WNL volume and pt's conversational volume and reiterated when he talks loudly it brings his volume  into/closer to WNL range. 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 SLP encouraged him that this was WNL volume for a portion of his last conversational turn.  03/15/23 (eval): n/a  PATIENT EDUCATION: Education details: See "today's treatment" for details Person educated: Patient and Spouse Education method: Explanation, Demonstration, and Verbal cues Education comprehension: verbalized understanding and needs further education  HOME EXERCISE PROGRAM: TBD   GOALS: Goals reviewed with patient? Yes (in general)  SHORT TERM GOALS: Target date: 04/14/23  Pt will complete MBS to assess current swallowing status and assess safe swallow strategies Baseline: Goal status: INITIAL  2.  Pt will maintain average mid-upper 80s for loud /a/ in 3 sessions Baseline:  Goal status: INITIAL  3.  Pt will use speech volume average upper 60s dB for 5 minutes simple conversation in two sessions Baseline:  Goal status: INITIAL  4.  pt will generate abdominal breathing at rest (seated position) 80% of the time over 2 sessions Baseline:  Goal status: INITIAL   LONG TERM GOALS: Target date: 05/12/23  Pt will improve PROM measure scores in the last 1-2 sessions Baseline:  Goal status: INITIAL  2.  Pt will maintain average upper 80s for loud /a/ in 3  sessions Baseline:  Goal status: INITIAL  3.  Pt will use speech volume average low 70s dB for 8 minutes simple-mod complex  conversation with occasional verbal cues in two sessions Baseline:  Goal status: INITIAL  4.   Pt will generate abdominal breathing 60% of the time in 8 minutes simple-mod complex conversation over 2 sessions Baseline:  Goal status: INITIAL   ASSESSMENT:  CLINICAL IMPRESSION: Patient is a 77 y.o. M who was seen today for treatment of speech intelligibility in light of Parkinsons disease. Goals for swallowing TBD following MBS. Today, pt was stimulable for incr'd speech volume at question and answer level. "They are always telling me to speak up," pt stated to SLP. Pt's OT to target compensations for cognition.  OBJECTIVE IMPAIRMENTS: Objective impairments include memory, dysarthria, and dysphagia. These impairments are limiting patient from ADLs/IADLs, effectively communicating at home and in community, and safety when swallowing.Factors affecting potential to achieve goals and functional outcome are ability to learn/carryover information and co-morbidities.. Patient will benefit from skilled SLP services to address above impairments and improve overall function.  REHAB POTENTIAL: Good  PLAN:  SLP FREQUENCY: 2x/week  SLP DURATION: 8 weeks  PLANNED INTERVENTIONS: Aspiration precaution training, Pharyngeal strengthening exercises, Diet toleration management , Environmental controls, Trials of upgraded texture/liquids, Internal/external aids, Oral motor exercises, Functional tasks, SLP instruction and feedback, Compensatory strategies, and Patient/family education    [facility], CCC-SLP 03/22/2023, 11:36 AM

## 2023-03-22 NOTE — Therapy (Signed)
OUTPATIENT OCCUPATIONAL THERAPY NEURO  Treatment Note  Patient Name: Jeff Weaver MRN: [id number] DOB:October 10, 1945, 77 y.o., male Today's Date: 03/22/2023  PCP: Pincus Sanes, MD REFERRING PROVIDER: Pincus Sanes, MD  END OF SESSION:  OT End of Session - 03/22/23 1109     Visit Number 2    Number of Visits 17    Date for OT Re-Evaluation 05/12/23    Authorization Type Medicare A&B    OT Start Time 1105    OT Stop Time 1145    OT Time Calculation (min) 40 min              Past Medical History:  Diagnosis Date   Aortic insufficiency    Atypical chest pain    Cardiac resynchronization therapy pacemaker (CRT-P) in place    Cerebral aneurysm    a. s/p clipping at [facility] in 8/13 c/b short-term memory loss, cerebral hemorrhage and seizure disorder s/p VP shunt.   Cerebral hemorrhage (HCC)    Chronic combined systolic and diastolic CHF (congestive heart failure) (HCC)    CVA (cerebral infarction)    Diverticulosis    Enteritis (regional)    Dr Juanda Chance   First degree AV block    GI bleed    HTN (hypertension)    Hyperlipidemia    Hypertensive heart disease    IBS (irritable bowel syndrome)    Lewy body dementia (HCC)    Mild CAD    Cardiac cath 01/12/18 showed minimal nonobstructive CAD, normal LVEF, normal LVEDP.   Mild dilation of ascending aorta (HCC)    Mitral regurgitation    NSVT (nonsustained ventricular tachycardia) (HCC)    Orthostatic hypotension    Pacemaker    Pre-diabetes    PVC's (premature ventricular contractions)    Regional enteritis of large intestine (HCC) since 1978   Rheumatoid arthritis(714.0)    dxed in [employer] in 1980s   Second degree AV block, Mobitz type I    Seizures (HCC)    last April 26,2021   Sinus bradycardia    Sleep apnea    SSS (sick sinus syndrome) (HCC)    Subdural hematoma (HCC)    Past Surgical History:  Procedure Laterality Date   BIV PACEMAKER INSERTION CRT-P N/A 03/10/2021   Procedure: BIV PACEMAKER INSERTION CRT-P  UPGRADE;  Surgeon: Regan Lemming, MD;  Location: [facility] INVASIVE CV LAB;  Service: Cardiovascular;  Laterality: N/A;   CHOLECYSTECTOMY N/A 01/07/2016   Procedure: LAPAROSCOPIC CHOLECYSTECTOMY ;  Surgeon: Abigail Miyamoto, MD;  Location: [facility] OR;  Service: General;  Laterality: N/A;   cns shunt  02/23/12   COLONOSCOPY W/ POLYPECTOMY  1978   negative 2009, Dr Juanda Chance. Due 2014   CRANIOTOMY  02/02/12   Dr Kelby Aline, [facility]-clipping of aneurysm   CRANIOTOMY  02-02-12   left pterional craniotomy for clipping complex anterior communicating artery aneurysm    HERNIA REPAIR     LEFT HEART CATH AND CORONARY ANGIOGRAPHY N/A 01/12/2018   Procedure: LEFT HEART CATH AND CORONARY ANGIOGRAPHY;  Surgeon: Swaziland, Peter M, MD;  Location: [facility] INVASIVE CV LAB;  Service: Cardiovascular;  Laterality: N/A;   LEFT HEART CATHETERIZATION WITH CORONARY ANGIOGRAM N/A 11/26/2013   Procedure: LEFT HEART CATHETERIZATION WITH CORONARY ANGIOGRAM;  Surgeon: Marykay Lex, MD;  Location: [facility] CATH LAB;  Service: Cardiovascular;  Laterality: N/A;   NOSE SURGERY     PACEMAKER IMPLANT N/A 07/02/2018   Procedure: PACEMAKER IMPLANT;  Surgeon: Regan Lemming, MD;  Location: [facility] INVASIVE CV LAB;  Service:  Cardiovascular;  Laterality: N/A;   SHOULDER SURGERY  1997   TONSILLECTOMY AND ADENOIDECTOMY     VENTRICULOPERITONEAL SHUNT  02-23-12   INSERTION OF RIGHT FRONTAL VENTRICULOPERITONEAL SHUNT WITH A CODMAN HAKIM PROGRAMMABLE VALVE   Patient Active Problem List   Diagnosis Date Noted   Benign paroxysmal positional vertigo of right ear 03/03/2023   COVID-19 03/03/2023   Abrasion, forearm with infection, left, initial encounter 03/03/2023   Physical deconditioning 12/20/2022   Poor balance 12/20/2022   PVC's (premature ventricular contractions) 09/19/2022   Diplopia 09/19/2022   Parkinsonism 06/19/2022   Primary localized osteoarthrosis of shoulder region 05/10/2022   Recurrent dislocation of shoulder joint 05/10/2022   Regional  enteritis (HCC) 05/10/2022   Vestibular hypofunction of left ear 05/10/2022   Sensorineural hearing loss 05/10/2022   Senile osteoporosis 02/03/2022   Pain in right hip 01/05/2022   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   Intertrigo 10/01/2021   HFrEF (heart failure with reduced ejection fraction) (HCC) 09/18/2021   Blepharitis of upper and lower eyelids of both eyes 09/18/2021   Tinea cruris 09/18/2021   Lewy body dementia (HCC) 09/16/2021   Closed fracture of right hip (HCC) 09/16/2021   S/P hip replacement, right  07/2021 09/16/2021   Presence of right artificial hip joint 08/06/2021   Vascular dementia (HCC) 04/29/2021   Multiple falls 04/29/2021   Cough 04/09/2021   Vitamin D deficiency 04/09/2021   Vitamin B1 deficiency 04/09/2021   Right arm pain 01/06/2021   Seizures (HCC)    Muscle cramping 12/30/2019   Dizziness 12/30/2019   Unsteady gait 12/30/2019   Pacemaker 11/27/2019   Fatigue 07/09/2019   SSS (sick sinus syndrome) (HCC) 07/02/2018   Bilateral shoulder pain 06/14/2018   Upper airway cough syndrome vs cough variant asthma 03/20/2018   Acute midline low back pain without sciatica 12/25/2017   Depression 10/17/2017   CAD (coronary artery disease), hx of NSTEMI 08/15/2017   Chest pain 07/17/2017   SOBOE (shortness of breath on exertion) 06/08/2017   Bilateral sensorineural hearing loss 06/01/2017   Nasal turbinate hypertrophy 06/01/2017   Dysphagia 05/31/2017   Osteoporosis 01/30/2017   History of cerebral hemorrhage 12/28/2016   Seborrheic dermatitis 07/25/2016   Cognitive communication deficit 06/28/2016   Hypertensive heart disease 06/28/2016   Vitamin B 12 deficiency 03/16/2016   Confusion 03/13/2016   VP (ventriculoperitoneal) shunt status 02/12/2016   Orthostatic hypotension 01/05/2016   Benign prostatic hyperplasia with urinary obstruction 08/04/2014   Urinary urgency 08/04/2014   Gastroesophageal reflux disease 07/14/2014   Polymyalgia rheumatica (HCC)  05/28/2014   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/2014   SBE (subacute bacterial endocarditis) prophylaxis candidate 11/24/2013   Aphasia 07/17/2013   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 seizures, not intractable, without status epilepticus (HCC) 07/17/2013   Subdural hemorrhage (HCC) 07/17/2013   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HCC) 07/17/2013   Pre-diabetes 03/26/2013   Urinary frequency 10/31/2012   Nocturia 10/31/2012   History of cerebral aneurysm repair 10/18/2012   History of stroke without residual deficits 10/18/2012   Ocular rosacea 09/27/2012   Seizure disorder (HCC) 07/23/2012   Entropion 07/10/2012   Hyperopia with astigmatism 07/10/2012   Combined senile cataract 04/13/2012   Presbyopia 04/13/2012   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) 03/01/2012   Stroke (HCC) 03/01/2012   Cognitive safety issue 02/20/2012   Nonruptured cerebral aneurysm 11/23/2011   Obstructive sleep apnea syndrome 08/09/2011   Hyperlipidemia 02/03/2009   HTN (hypertension) 02/03/2009   History of Crohn's disease 04/17/2008  Allergic rhinitis, unspecified 07/05/1999   Cataract, right eye 07/05/1999    ONSET DATE: referral date 02/21/23  REFERRING DIAG: R41.841 (ICD-10-CM) - Cognitive communication deficit G31.83,F02.80 (ICD-10-CM) - Lewy body dementia, unspecified dementia severity, unspecified whether behavioral, psychotic, or mood disturbance or anxiety (HCC) I63.512 (ICD-10-CM) - 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 Pt accompanied by: self and family member (spouse)  PERTINENT HISTORY: Aortic insufficiency, CRT-P  pacemaker placement, cerebral aneurysm s/p clipping, cerebral hemorrhage and seizure disorder s/p VP shunt, CHF, CVA, second degree AV block Mobitz type 1 s/p PPM, HTN, HLD, hypertensive heart disease, IBS, lew body dementia, mitral regurgitation, NSVT, orthostatic hypotension, RA, SSS, subdural hematoma, hernia repair, cardiac cath  PRECAUTIONS: Fall, ICD/Pacemaker, and Other: poor vision, STM impairments  WEIGHT BEARING RESTRICTIONS: No  PAIN:  Are you having pain? Yes: NPRS scale: 6/10 Pain location: B knees R > L, back Pain description: discomfort Aggravating factors: prolonged sitting Relieving factors: movement  FALLS: Has patient fallen in last 6 months? Yes. Number of falls 2-3, getting in/out of bed or when turning to pick something up   LIVING ENVIRONMENT: Lives with: lives with their family (daughter comes and goes, will spend a few days per week in the home with them). Lives in: House/apartment Stairs: Yes: Internal: 14 steps; and External: 1 step to enter home  Has following equipment at home: Dan Humphreys - 2 wheeled, Environmental consultant - 4 wheeled, Tour manager, and Grab bars  PLOF: Independent and Independent with basic ADLs  PATIENT GOALS: improve endurance and engagement in ADLs  OBJECTIVE:   HAND DOMINANCE: Right  ADLs: Transfers/ambulation related to ADLs: Mod I to supervision with RW Eating: spouse typically cuts foods for him, pt will lean towards food instead of bringing food to mouth, spoon is preferred utensil Grooming: increased time UB Dressing: increased time, is ableto manage buttons LB Dressing: wears disposable underwear and pull on pants for increased ease Toileting: Mod I, does have a grab bar in front that he will pull up on; difficulty with hygiene post BM Bathing: spouse assists with bathing, requires encouragement to bathe Tub Shower transfers: spouse assists with transfers Equipment: Transfer tub bench and Grab bars  IADLs: Medication management: spouse  fills pill box and will remind him to take  MOBILITY STATUS: Needs Assist: Mod I to Supervision with RW and Hx of falls; required min assist to stand from chair without arm rests  POSTURE COMMENTS:  rounded shoulders, forward head, decreased lumbar lordosis, increased thoracic kyphosis, and right head tilt  ACTIVITY TOLERANCE: Activity tolerance: diminished  UPPER EXTREMITY ROM:    Active ROM Right eval Left eval  Shoulder flexion 90 110  Shoulder abduction    Shoulder adduction    Shoulder extension    Shoulder internal rotation 80% 70%  Shoulder external rotation 80% 80%  Elbow flexion    Elbow extension    Wrist flexion    Wrist extension    Wrist ulnar deviation    Wrist radial deviation    Wrist pronation    Wrist supination    (Blank rows = not tested)  HAND FUNCTION: TBD  COORDINATION: 03/22/23 9 Hole Peg test: Right: 53.19 sec; Left: 1:17.28 sec Box and Blocks:  Right 26 blocks,  Left 23 blocks  COGNITION: Overall cognitive status: History of cognitive impairments - at baseline Brief Interview for Mental Status:   Memory:  Word Repetition:   "Sock"  "Blue"  "Bed"   Orientation Level:    Year:   2024  Month: Sept  Day of Week: Weds  Memory:  Recall:     Unable to recall "Sock"  with cue   Able to recall  "Blue"  without cue   Able to recall  "Bed"   with cue     No Recall   VISION: Subjective report: diplopia.  Recently went to ophthalmologist who applied a Fresnel prism 8 base in on the right lens of his readers. Baseline vision: Wears glasses for reading only  VISION ASSESSMENT: Impaired To be further assessed in functional context   TODAY'S TREATMENT:                                                                        DATE: 03/22/23 Discussed sustained attention during task as pt interrupting tasks to tell stories and/or further conversation which may be impacting his speed as well as his attention to task.  Reiterated recommendation  for decreasing distractions as able to allow for increased focus on tasks. Coordination 9 Hole Peg test: Right: 53.19 sec; Left: 1:17.28 sec Box and Blocks:  Right 26 blocks, Left 23 blocks Vision/attention: engaged in trail making, completed in 2:42 stopping ~3x to process and scan for next number.  Pt with one error going from 22 >19> 24.  Pt also not touching 30% of numbers, due to diplopia and missing numbers to the Left of a portion of numbers.   Discussing dressing routine, setup. Pt dressing in the bathroom after bathing or after having an accident.  OT providing demonstrating and education on modified positioning and techniques to increase success with LB dressing.  Discussed use of reacher, figure 4 position, and/or use of step stool to rest foot on during LB dressing.   03/15/23 NA, eval only  PATIENT EDUCATION: Education details: ongoing condition specific education Person educated: Patient, Spouse, and Child(ren) Education method: Explanation Education comprehension: verbalized understanding and needs further education  HOME EXERCISE PROGRAM: TBD   GOALS: Goals reviewed with patient? Yes  SHORT TERM GOALS: Target date: 04/14/23  Pt and caregiver will be independent with gentle ROM HEP for improved functional use of BUE. Baseline: Goal status: IN PROGRESS  2.  Pt will verbalize understanding of task modifications and/or potential AE needs to increase ease, safety, and independence w/ ADLs Baseline:  Goal status: IN PROGRESS  3.  Pt will verbalize understanding of use of new glasses with Fresnel lenses for increased visual perception as needed for ADLs and IADLs. Baseline:  Goal status: IN PROGRESS   LONG TERM GOALS: Target date: 05/12/23  Pt will demonstrate and/or report ability to complete LB dressing to include footwear at Mod I level utilizing adaptive techniques and/or AE PRN. Baseline:  Goal status: IN PROGRESS  2.  Pt will be able to complete toileting  hygiene at Mod I level with adaptive techniques and/or AE PRN. Baseline:  Goal status: IN PROGRESS  3.  Pt will demonstrate improved internal rotation as needed for clothing management and hygiene post  toileting. Baseline:  Goal status: IN PROGRESS  4.  Pt will demonstrate improved sustained attention to task with ability to complete table top task for 10 mins with 1 or fewer cues for attention to task. Baseline:  Goal status: IN PROGRESS  ASSESSMENT:  CLINICAL IMPRESSION: Pt continues to require increased time for coordination and visual scanning tasks, discussed decreasing distractions as able to facilitate increased focus on tasks.  Reiterated increased time for processing and motor planning.  OT introduced variety of techniques for LB dressing with pt to attempt at home and wife to bring in pictures of bathroom setup to aid in modifications of routines with LB dressing and other self-care tasks.  PERFORMANCE DEFICITS: in functional skills including ADLs, IADLs, ROM, pain, flexibility, Gross motor control, balance, body mechanics, endurance, and decreased knowledge of use of DME, cognitive skills including attention, memory, problem solving, and safety awareness, and psychosocial skills including coping strategies, environmental adaptation, and habits.     PLAN:  OT FREQUENCY: 2x/week  OT DURATION: 8 weeks  PLANNED INTERVENTIONS: self care/ADL training, therapeutic exercise, therapeutic activity, neuromuscular re-education, passive range of motion, balance training, functional mobility training, ultrasound, compression bandaging, moist heat, cryotherapy, patient/family education, cognitive remediation/compensation, visual/perceptual remediation/compensation, psychosocial skills training, energy conservation, coping strategies training, and DME and/or AE instructions  RECOMMENDED OTHER SERVICES: NA  CONSULTED AND AGREED WITH PLAN OF CARE: Patient and family member/caregiver  PLAN  FOR NEXT SESSION: adaptive techniques for LB dressing,sit > stand   Jillien Yakel, OTR/L 03/22/2023, 11:10 AM

## 2023-03-22 NOTE — Patient Instructions (Addendum)
   EVERYDAY SENTENCES Jeff Weaver - need some help!  Will you bring me a bottle of water please?  How do you feel today?  I need something to drink.  Can't see the TV.  Yes ma'am.  What are we doing today?  Where are my glasses?  I can't hear you.

## 2023-03-22 NOTE — Therapy (Signed)
OUTPATIENT PHYSICAL THERAPY NEURO TREATMENT   Patient Name: Jeff Weaver MRN: [id number] DOB:1945/12/23, 77 y.o., male Today's Date: 03/23/2023   PCP: Pincus Sanes, MD REFERRING PROVIDER: Pincus Sanes, MD  END OF SESSION:  PT End of Session - 03/23/23 1614     Visit Number 5    Number of Visits 25    Date for PT Re-Evaluation 05/15/23    Authorization Type MCR/[employer]    Authorization Time Period 02/20/23 to 05/15/23    Progress Note Due on Visit 10    PT Start Time 1530    PT Stop Time 1612    PT Time Calculation (min) 42 min    Equipment Utilized During Treatment Gait belt    Activity Tolerance Patient tolerated treatment well    Behavior During Therapy [facility] for tasks assessed/performed                Past Medical History:  Diagnosis Date   Aortic insufficiency    Atypical chest pain    Cardiac resynchronization therapy pacemaker (CRT-P) in place    Cerebral aneurysm    a. s/p clipping at [facility] in 8/13 c/b short-term memory loss, cerebral hemorrhage and seizure disorder s/p VP shunt.   Cerebral hemorrhage (HCC)    Chronic combined systolic and diastolic CHF (congestive heart failure) (HCC)    CVA (cerebral infarction)    Diverticulosis    Enteritis (regional)    Dr Juanda Chance   First degree AV block    GI bleed    HTN (hypertension)    Hyperlipidemia    Hypertensive heart disease    IBS (irritable bowel syndrome)    Lewy body dementia (HCC)    Mild CAD    Cardiac cath 01/12/18 showed minimal nonobstructive CAD, normal LVEF, normal LVEDP.   Mild dilation of ascending aorta (HCC)    Mitral regurgitation    NSVT (nonsustained ventricular tachycardia) (HCC)    Orthostatic hypotension    Pacemaker    Pre-diabetes    PVC's (premature ventricular contractions)    Regional enteritis of large intestine (HCC) since 1978   Rheumatoid arthritis(714.0)    dxed in [employer] in 1980s   Second degree AV block, Mobitz type I    Seizures (HCC)    last April 26,2021    Sinus bradycardia    Sleep apnea    SSS (sick sinus syndrome) (HCC)    Subdural hematoma (HCC)    Past Surgical History:  Procedure Laterality Date   BIV PACEMAKER INSERTION CRT-P N/A 03/10/2021   Procedure: BIV PACEMAKER INSERTION CRT-P UPGRADE;  Surgeon: Regan Lemming, MD;  Location: [facility] INVASIVE CV LAB;  Service: Cardiovascular;  Laterality: N/A;   CHOLECYSTECTOMY N/A 01/07/2016   Procedure: LAPAROSCOPIC CHOLECYSTECTOMY ;  Surgeon: Abigail Miyamoto, MD;  Location: [facility] OR;  Service: General;  Laterality: N/A;   cns shunt  02/23/12   COLONOSCOPY W/ POLYPECTOMY  1978   negative 2009, Dr Juanda Chance. Due 2014   CRANIOTOMY  02/02/12   Dr Kelby Aline, [facility]-clipping of aneurysm   CRANIOTOMY  02-02-12   left pterional craniotomy for clipping complex anterior communicating artery aneurysm    HERNIA REPAIR     LEFT HEART CATH AND CORONARY ANGIOGRAPHY N/A 01/12/2018   Procedure: LEFT HEART CATH AND CORONARY ANGIOGRAPHY;  Surgeon: Swaziland, Peter M, MD;  Location: [facility] INVASIVE CV LAB;  Service: Cardiovascular;  Laterality: N/A;   LEFT HEART CATHETERIZATION WITH CORONARY ANGIOGRAM N/A 11/26/2013   Procedure: LEFT HEART CATHETERIZATION WITH CORONARY ANGIOGRAM;  Surgeon: Marykay Lex, MD;  Location: [facility] CATH LAB;  Service: Cardiovascular;  Laterality: N/A;   NOSE SURGERY     PACEMAKER IMPLANT N/A 07/02/2018   Procedure: PACEMAKER IMPLANT;  Surgeon: Regan Lemming, MD;  Location: [facility] INVASIVE CV LAB;  Service: Cardiovascular;  Laterality: N/A;   SHOULDER SURGERY  1997   TONSILLECTOMY AND ADENOIDECTOMY     VENTRICULOPERITONEAL SHUNT  02-23-12   INSERTION OF RIGHT FRONTAL VENTRICULOPERITONEAL SHUNT WITH A CODMAN HAKIM PROGRAMMABLE VALVE   Patient Active Problem List   Diagnosis Date Noted   Benign paroxysmal positional vertigo of right ear 03/03/2023   COVID-19 03/03/2023   Abrasion, forearm with infection, left, initial encounter 03/03/2023   Physical deconditioning 12/20/2022   Poor balance 12/20/2022    PVC's (premature ventricular contractions) 09/19/2022   Diplopia 09/19/2022   Parkinsonism 06/19/2022   Primary localized osteoarthrosis of shoulder region 05/10/2022   Recurrent dislocation of shoulder joint 05/10/2022   Regional enteritis (HCC) 05/10/2022   Vestibular hypofunction of left ear 05/10/2022   Sensorineural hearing loss 05/10/2022   Senile osteoporosis 02/03/2022   Pain in right hip 01/05/2022   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   Intertrigo 10/01/2021   HFrEF (heart failure with reduced ejection fraction) (HCC) 09/18/2021   Blepharitis of upper and lower eyelids of both eyes 09/18/2021   Tinea cruris 09/18/2021   Lewy body dementia (HCC) 09/16/2021   Closed fracture of right hip (HCC) 09/16/2021   S/P hip replacement, right  07/2021 09/16/2021   Presence of right artificial hip joint 08/06/2021   Vascular dementia (HCC) 04/29/2021   Multiple falls 04/29/2021   Cough 04/09/2021   Vitamin D deficiency 04/09/2021   Vitamin B1 deficiency 04/09/2021   Right arm pain 01/06/2021   Seizures (HCC)    Muscle cramping 12/30/2019   Dizziness 12/30/2019   Unsteady gait 12/30/2019   Pacemaker 11/27/2019   Fatigue 07/09/2019   SSS (sick sinus syndrome) (HCC) 07/02/2018   Bilateral shoulder pain 06/14/2018   Upper airway cough syndrome vs cough variant asthma 03/20/2018   Acute midline low back pain without sciatica 12/25/2017   Depression 10/17/2017   CAD (coronary artery disease), hx of NSTEMI 08/15/2017   Chest pain 07/17/2017   SOBOE (shortness of breath on exertion) 06/08/2017   Bilateral sensorineural hearing loss 06/01/2017   Nasal turbinate hypertrophy 06/01/2017   Dysphagia 05/31/2017   Osteoporosis 01/30/2017   History of cerebral hemorrhage 12/28/2016   Seborrheic dermatitis 07/25/2016   Cognitive communication deficit 06/28/2016   Hypertensive heart disease 06/28/2016   Vitamin B 12 deficiency 03/16/2016   Confusion 03/13/2016   VP (ventriculoperitoneal) shunt  status 02/12/2016   Orthostatic hypotension 01/05/2016   Benign prostatic hyperplasia with urinary obstruction 08/04/2014   Urinary urgency 08/04/2014   Gastroesophageal reflux disease 07/14/2014   Polymyalgia rheumatica (HCC) 05/28/2014   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/2014   SBE (subacute bacterial endocarditis) prophylaxis candidate 11/24/2013   Aphasia 07/17/2013   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 seizures, not intractable, without status epilepticus (HCC) 07/17/2013   Subdural hemorrhage (HCC) 07/17/2013   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HCC) 07/17/2013   Pre-diabetes 03/26/2013   Urinary frequency 10/31/2012   Nocturia 10/31/2012   History of cerebral aneurysm repair 10/18/2012   History of stroke without residual deficits 10/18/2012   Ocular rosacea 09/27/2012   Seizure disorder (HCC) 07/23/2012   Entropion 07/10/2012   Hyperopia with astigmatism 07/10/2012   Combined senile cataract 04/13/2012   Presbyopia 04/13/2012   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) 03/01/2012   Stroke (HCC) 03/01/2012   Cognitive safety issue 02/20/2012   Nonruptured cerebral aneurysm 11/23/2011   Obstructive sleep apnea syndrome 08/09/2011   Hyperlipidemia 02/03/2009   HTN (hypertension) 02/03/2009   History of Crohn's disease 04/17/2008   Allergic rhinitis, unspecified 07/05/1999   Cataract, right eye 07/05/1999    ONSET DATE: chronic   REFERRING DIAG: R26.89 (ICD-10-CM) - Poor balance R53.81 (ICD-10-CM) - Physical deconditioning  THERAPY DIAG:  Muscle weakness (generalized)  Unsteadiness on feet  Other symptoms and signs involving cognitive functions following unspecified cerebrovascular disease  Other abnormalities of gait and mobility  Rationale for Evaluation and Treatment: Rehabilitation  SUBJECTIVE:                                                                                                                                                                                              SUBJECTIVE STATEMENT: "I'm in sad shape today. Could hardly get out of bed last week."  Pt accompanied by: significant other  PERTINENT HISTORY: Aortic insufficiency, CRT-P pacemaker placement, cerebral aneurysm s/p clipping, cerebral hemorrhage and seizure disorder s/p VP shunt, CHF, CVA, second degree AV block Mobitz type 1 s/p PPM, HTN, HLD, hypertensive heart disease, IBS, lew body dementia, mitral regurgitation, NSVT, orthostatic hypotension, RA, SSS, subdural hematoma, hernia repair, cardiac cath  PAIN:  Are you having pain? Yes: NPRS scale: "B LEs and knees"/10 Pain location: "B LEs and knees"  Pain description: discomfort, some radicular pains down legs especially in the mornings  Aggravating factors: standing up, sitting unsupported Relieving factors: movement  PRECAUTIONS: Fall and ICD/Pacemaker, poor vision, STM impairments  RED FLAGS: None   WEIGHT BEARING RESTRICTIONS: No  FALLS: Has patient fallen in last 6 months? Yes. Number of falls 2 in the past 6 months, had additional falls >6 months ago; per spouse, falls a lot when bending to pick something up off the floor, fall with SDH and hip fracture in 2023 was due to slipping on wet grass outside   LIVING ENVIRONMENT: Lives with: lives with their family Lives in: House/apartment Stairs: Yes: Internal: 14 steps; one step to enter home  Has following equipment at home: Single point cane and "semi-hospital bed that adjusts the head, walker with 2 wheels, 4WW and WC, shower bench"   PLOF: Independent, Independent with basic ADLs, Requires assistive device for independence, and handheld shower head; per spouse "house is older and difficult to renovate, may try to convert bathroom to a shower stall  PATIENT GOALS: improve strength, balance, endurance   OBJECTIVE:       TODAY'S TREATMENT: 03/23/23 Activity  Comments  Nustep L6 x 6 min UEs/LEs Heavy cueing to  increase pace to at least 40 SPM  sitting PWR! Moves Up 10x  Rock 10x  Twist 10x with boomwhackers  Step 10x with boomwhackers  Demo throughout; pt with difficulty matching PT's pace . Modified Rock with less wt shift and slapping knees in the middle to make intentional stop at midline. Pt had difficulty with set switching and coordinating/matching PT's position. Good improvement in ability to rotate trunk when adding in boomwhackers.    Review of all seated PWR moves and educated wife on these exercises at home   Edu on form with STS; cues for longer steps with gait heading out to lobby     PATIENT EDUCATION: Education details: HEP update  Person educated: Patient and Spouse Education method: Explanation, Demonstration, Tactile cues, Verbal cues, and Handouts Education comprehension: verbalized understanding and returned demonstration   HOME EXERCISE PROGRAM: Access Code: [email or web address] URL: [email or web address] Date: 03/13/2023 Prepared by: [facility] - [facility]  Program Notes walking program: practice walking with the walker for 5 minutes inside the house every hour   Exercises - Seated March with Resistance  - 1 x daily - 5 x weekly - 2 sets - 10 reps - Seated Long Arc Quad  - 1 x daily - 5 x weekly - 3 sets - 10 reps - Seated Ankle Dorsiflexion AROM  - 1 x daily - 7 x weekly - 3 sets - 10 reps - 3 sec hold - Seated Heel Raise  - 1 x daily - 5 x weekly - 2 sets - 10 reps - Cervical Extension AROM with Strap  - 1 x daily - 7 x weekly - 3 sets - 10 reps - Side to Side Weight Shift with Overhead Reach and Counter Support  - 1 x daily - 7 x weekly - 3 sets - 10 reps - Standing Gastroc Stretch at Counter  - 1 x daily - 7 x weekly - 3 sets - 30-60 sec hold   Added: sitting PWR! Moves handout  Use of boomwhackers/paper towel roll with twist and step 10x each    ----------------------------------------------------- Objective measures below  taken at initial evaluation:  COGNITION: Overall cognitive status: History of cognitive impairments - at baseline       POSTURE: rounded shoulders, forward head, decreased lumbar lordosis, and increased thoracic kyphosis, flexed at hips     LOWER EXTREMITY MMT:    MMT Right Eval Left Eval  Hip flexion 3 3  Hip extension    Hip abduction 3 seated  3 seated  Hip adduction    Hip internal rotation    Hip external rotation    Knee flexion    Knee extension 4 4  Ankle dorsiflexion 3+ 3+  Ankle plantarflexion    Ankle inversion    Ankle eversion    (Blank rows = not tested)    TRANSFERS: Assistive device utilized: Single point cane  Sit to stand: SBA Stand to sit: SBA Chair to chair: SBA      GAIT: Gait pattern: step through pattern, decreased arm swing- Right, decreased arm swing- Left, decreased step length- Right, decreased step length- Left, decreased stance time- Right, decreased stance time- Left, decreased stride length, decreased ankle dorsiflexion- Right, decreased ankle dorsiflexion- Left, Right foot flat, Left foot flat, shuffling, trendelenburg, decreased trunk rotation, trunk flexed, and wide BOS Distance walked: in clinic distances  Assistive device utilized: Single point cane Level of  assistance: SBA Comments: slow and steady with SPC   FUNCTIONAL TESTS:  5 times sit to stand: 58 seconds use of UEs on chair  Timed up and go (TUG): 44 seconds, SPC  Gait speed 0.14 m/s with SPC   PATIENT SURVEYS:  FOTO next visit   TODAY'S TREATMENT:                                                                                                                              DATE:   Eval  Objective measures, care planning    PATIENT EDUCATION: Education details: exam findings, POC, offered SLP and OT skilled services  Person educated: Patient and Spouse Education method: Explanation Education comprehension: verbalized understanding and needs further  education  HOME EXERCISE PROGRAM: TBD   GOALS: Goals reviewed with patient? Yes  SHORT TERM GOALS: Target date: 04/03/2023    Will be compliant with appropriate progressive HEP with no more than MinA from family  Baseline: Goal status: IN PROGRESS  2.  Will improve gait speed to at least 0.42m/s with LRAD  Baseline:  Goal status: IN PROGRESS  3.  Will complete TUG test in 30 seconds or better with LRAD  Baseline:  Goal status: IN PROGRESS  4.  Will complete 5xSTS test in 45 seconds or less with UEs  Baseline:  Goal status: IN PROGRESS  5.  Will tolerate walking and/or activity in PT for at least 10 minutes without needing a rest break  Baseline:  Goal status: IN PROGRESS    LONG TERM GOALS: Target date: 05/15/2023    MMT to improve by one grade in all weak groups  Baseline:  Goal status: IN PROGRESS  2.  Will be able to ambulate at least 3100ft in (0.72m/s) with LRAD to show improved community access no rest breaks   Baseline:  Goal status: IN PROGRESS  3.  Will score at least 42 on the Berg to show reduced fall risk  Baseline:  Goal status: IN PROGRESS  4.  Will complete 5xSTS test in 20 seconds or less with UEs to show improved functional mobility  Baseline:  Goal status: IN PROGRESS  5.  Will be compliant with appropriate progressive long term exercise plan with no more than MinA from family to combat sedentary lifestyle and maintain functional gains from PT  Baseline:  Goal status: IN PROGRESS    ASSESSMENT:  CLINICAL IMPRESSION: Patient arrived to session with report of LE soreness today. Initiated PWR moves in sitting today. Patient required cueing for pacing, set switching, and mirroring PT's motion. Patient responded well with use of tactile cues such as use of boomwhackers. Educated spouse and patient on HEP update with exercises performed today. Both reported understanding and without complaints upon leaving.   OBJECTIVE IMPAIRMENTS: Abnormal  gait, decreased activity tolerance, decreased balance, decreased cognition, decreased coordination, decreased knowledge of use of DME, decreased mobility, difficulty walking, decreased strength, decreased safety awareness, postural dysfunction, and  obesity.   ACTIVITY LIMITATIONS: lifting, bending, standing, squatting, stairs, transfers, bed mobility, and locomotion level  PARTICIPATION LIMITATIONS: meal prep, cleaning, shopping, community activity, yard work, and church  PERSONAL FACTORS: Age, Fitness, Past/current experiences, Social background, and Time since onset of injury/illness/exacerbation are also affecting patient's functional outcome.   REHAB POTENTIAL: Good  CLINICAL DECISION MAKING: Evolving/moderate complexity  EVALUATION COMPLEXITY: Moderate  PLAN:  PT FREQUENCY: 2x/week  PT DURATION: 12 weeks  PLANNED INTERVENTIONS: Therapeutic exercises, Therapeutic activity, Neuromuscular re-education, Balance training, Gait training, Patient/Family education, Self Care, Stair training, DME instructions, Aquatic Therapy, Dry Needling, Cognitive remediation, Cryotherapy, Moist heat, Manual therapy, and Re-evaluation  PLAN FOR NEXT SESSION: review sitting PWR! Moves; Review initial HEP and progress as able (try to add seated hamstring stretch).  Utilize NuStep, Work on strength, balance, endurance as tolerated.    Anette Guarneri, PT, DPT 03/23/23 4:16 PM  [facility] [address] [place], [address] [place], [place] [address] Phone # [phone] Fax # [phone]

## 2023-03-23 ENCOUNTER — Encounter: Payer: Self-pay | Admitting: Physical Therapy

## 2023-03-23 ENCOUNTER — Ambulatory Visit: Payer: Medicare Other | Admitting: Physical Therapy

## 2023-03-23 DIAGNOSIS — R2689 Other abnormalities of gait and mobility: Secondary | ICD-10-CM

## 2023-03-23 DIAGNOSIS — R2681 Unsteadiness on feet: Secondary | ICD-10-CM

## 2023-03-23 DIAGNOSIS — M6281 Muscle weakness (generalized): Secondary | ICD-10-CM

## 2023-03-23 DIAGNOSIS — R41840 Attention and concentration deficit: Secondary | ICD-10-CM | POA: Diagnosis not present

## 2023-03-23 DIAGNOSIS — I69918 Other symptoms and signs involving cognitive functions following unspecified cerebrovascular disease: Secondary | ICD-10-CM

## 2023-03-23 DIAGNOSIS — R293 Abnormal posture: Secondary | ICD-10-CM | POA: Diagnosis not present

## 2023-03-24 ENCOUNTER — Ambulatory Visit: Payer: Medicare Other | Attending: Physician Assistant

## 2023-03-24 ENCOUNTER — Telehealth (HOSPITAL_COMMUNITY): Payer: Self-pay | Admitting: *Deleted

## 2023-03-24 DIAGNOSIS — I951 Orthostatic hypotension: Secondary | ICD-10-CM | POA: Diagnosis not present

## 2023-03-24 DIAGNOSIS — R079 Chest pain, unspecified: Secondary | ICD-10-CM

## 2023-03-24 DIAGNOSIS — I340 Nonrheumatic mitral (valve) insufficiency: Secondary | ICD-10-CM | POA: Diagnosis not present

## 2023-03-24 DIAGNOSIS — I428 Other cardiomyopathies: Secondary | ICD-10-CM

## 2023-03-24 DIAGNOSIS — I77810 Thoracic aortic ectasia: Secondary | ICD-10-CM

## 2023-03-24 DIAGNOSIS — I351 Nonrheumatic aortic (valve) insufficiency: Secondary | ICD-10-CM | POA: Diagnosis not present

## 2023-03-24 DIAGNOSIS — I5042 Chronic combined systolic (congestive) and diastolic (congestive) heart failure: Secondary | ICD-10-CM

## 2023-03-24 DIAGNOSIS — I493 Ventricular premature depolarization: Secondary | ICD-10-CM | POA: Diagnosis not present

## 2023-03-24 LAB — CLOSTRIDIUM DIFFICILE BY PCR: Toxigenic C. Difficile by PCR: NEGATIVE

## 2023-03-24 NOTE — Telephone Encounter (Signed)
Attempted to contact patient to schedule OP MBS. Left VM. RKEEL

## 2023-03-25 LAB — LIPID PANEL
Chol/HDL Ratio: 2.8 ratio (ref 0.0–5.0)
Cholesterol, Total: 125 mg/dL (ref 100–199)
HDL: 44 mg/dL (ref >39)
LDL Chol Calc (NIH): 63 mg/dL (ref 0–99)
Triglycerides: 94 mg/dL (ref 0–149)
VLDL Cholesterol Cal: 18 mg/dL (ref 5–40)

## 2023-03-25 LAB — HEPATIC FUNCTION PANEL
ALT: 37 IU/L (ref 0–44)
AST: 24 IU/L (ref 0–40)
Albumin: 4.1 g/dL (ref 3.8–4.8)
Alkaline Phosphatase: 56 IU/L (ref 44–121)
Bilirubin Total: 0.5 mg/dL (ref 0.0–1.2)
Bilirubin, Direct: 0.18 mg/dL (ref 0.00–0.40)
Total Protein: 6.8 g/dL (ref 6.0–8.5)

## 2023-03-25 LAB — CALPROTECTIN, FECAL: Calprotectin, Fecal: 120 ug/g (ref 0–120)

## 2023-03-26 LAB — STOOL CULTURE: E coli, Shiga toxin Assay: NEGATIVE

## 2023-03-27 ENCOUNTER — Ambulatory Visit: Payer: Medicare Other | Admitting: Occupational Therapy

## 2023-03-27 ENCOUNTER — Ambulatory Visit: Payer: Medicare Other

## 2023-03-27 ENCOUNTER — Encounter: Payer: Self-pay | Admitting: Internal Medicine

## 2023-03-27 ENCOUNTER — Ambulatory Visit: Payer: Medicare Other | Admitting: Physical Therapy

## 2023-03-27 ENCOUNTER — Encounter: Payer: Self-pay | Admitting: Physical Therapy

## 2023-03-27 ENCOUNTER — Other Ambulatory Visit (HOSPITAL_COMMUNITY): Payer: Self-pay | Admitting: *Deleted

## 2023-03-27 DIAGNOSIS — I69918 Other symptoms and signs involving cognitive functions following unspecified cerebrovascular disease: Secondary | ICD-10-CM | POA: Diagnosis not present

## 2023-03-27 DIAGNOSIS — M6281 Muscle weakness (generalized): Secondary | ICD-10-CM

## 2023-03-27 DIAGNOSIS — R41840 Attention and concentration deficit: Secondary | ICD-10-CM | POA: Diagnosis not present

## 2023-03-27 DIAGNOSIS — R1310 Dysphagia, unspecified: Secondary | ICD-10-CM

## 2023-03-27 DIAGNOSIS — R059 Cough, unspecified: Secondary | ICD-10-CM

## 2023-03-27 DIAGNOSIS — R471 Dysarthria and anarthria: Secondary | ICD-10-CM

## 2023-03-27 DIAGNOSIS — R2689 Other abnormalities of gait and mobility: Secondary | ICD-10-CM | POA: Diagnosis not present

## 2023-03-27 DIAGNOSIS — R293 Abnormal posture: Secondary | ICD-10-CM | POA: Diagnosis not present

## 2023-03-27 DIAGNOSIS — R2681 Unsteadiness on feet: Secondary | ICD-10-CM | POA: Diagnosis not present

## 2023-03-27 NOTE — Therapy (Signed)
OUTPATIENT PHYSICAL THERAPY NEURO TREATMENT   Patient Name: Jeff Weaver MRN: [id number] DOB:[birth year], 77 y.o., male Today's Date: 03/27/2023   PCP: Pincus Sanes, MD REFERRING PROVIDER: Pincus Sanes, MD  END OF SESSION:  PT End of Session - 03/27/23 1447     Visit Number 6    Number of Visits 25    Date for PT Re-Evaluation 05/15/23    Authorization Type MCR/[employer]    Authorization Time Period 02/20/23 to 05/15/23    Progress Note Due on Visit 10    PT Start Time 1451   pt in restroom   PT Stop Time 1532    PT Time Calculation (min) 41 min    Equipment Utilized During Treatment Gait belt    Activity Tolerance Patient tolerated treatment well    Behavior During Therapy [facility] for tasks assessed/performed                 Past Medical History:  Diagnosis Date   Aortic insufficiency    Atypical chest pain    Cardiac resynchronization therapy pacemaker (CRT-P) in place    Cerebral aneurysm    a. s/p clipping at [facility] in 8/13 c/b short-term memory loss, cerebral hemorrhage and seizure disorder s/p VP shunt.   Cerebral hemorrhage (HCC)    Chronic combined systolic and diastolic CHF (congestive heart failure) (HCC)    CVA (cerebral infarction)    Diverticulosis    Enteritis (regional)    Dr Juanda Chance   First degree AV block    GI bleed    HTN (hypertension)    Hyperlipidemia    Hypertensive heart disease    IBS (irritable bowel syndrome)    Lewy body dementia (HCC)    Mild CAD    Cardiac cath 01/12/18 showed minimal nonobstructive CAD, normal LVEF, normal LVEDP.   Mild dilation of ascending aorta (HCC)    Mitral regurgitation    NSVT (nonsustained ventricular tachycardia) (HCC)    Orthostatic hypotension    Pacemaker    Pre-diabetes    PVC's (premature ventricular contractions)    Regional enteritis of large intestine (HCC) since 1978   Rheumatoid arthritis(714.0)    dxed in [employer] in 1980s   Second degree AV block, Mobitz type I    Seizures (HCC)     last April 26,2021   Sinus bradycardia    Sleep apnea    SSS (sick sinus syndrome) (HCC)    Subdural hematoma (HCC)    Past Surgical History:  Procedure Laterality Date   BIV PACEMAKER INSERTION CRT-P N/A 03/10/2021   Procedure: BIV PACEMAKER INSERTION CRT-P UPGRADE;  Surgeon: Regan Lemming, MD;  Location: [facility] INVASIVE CV LAB;  Service: Cardiovascular;  Laterality: N/A;   CHOLECYSTECTOMY N/A 01/07/2016   Procedure: LAPAROSCOPIC CHOLECYSTECTOMY ;  Surgeon: Abigail Miyamoto, MD;  Location: [facility] OR;  Service: General;  Laterality: N/A;   cns shunt  02/23/12   COLONOSCOPY W/ POLYPECTOMY  1978   negative 2009, Dr Juanda Chance. Due 2014   CRANIOTOMY  02/02/12   Dr Kelby Aline, [facility]-clipping of aneurysm   CRANIOTOMY  02-02-12   left pterional craniotomy for clipping complex anterior communicating artery aneurysm    HERNIA REPAIR     LEFT HEART CATH AND CORONARY ANGIOGRAPHY N/A 01/12/2018   Procedure: LEFT HEART CATH AND CORONARY ANGIOGRAPHY;  Surgeon: Swaziland, Peter M, MD;  Location: [facility] INVASIVE CV LAB;  Service: Cardiovascular;  Laterality: N/A;   LEFT HEART CATHETERIZATION WITH CORONARY ANGIOGRAM N/A 11/26/2013   Procedure: LEFT HEART  CATHETERIZATION WITH CORONARY ANGIOGRAM;  Surgeon: Marykay Lex, MD;  Location: [facility] CATH LAB;  Service: Cardiovascular;  Laterality: N/A;   NOSE SURGERY     PACEMAKER IMPLANT N/A 07/02/2018   Procedure: PACEMAKER IMPLANT;  Surgeon: Regan Lemming, MD;  Location: [facility] INVASIVE CV LAB;  Service: Cardiovascular;  Laterality: N/A;   SHOULDER SURGERY  1997   TONSILLECTOMY AND ADENOIDECTOMY     VENTRICULOPERITONEAL SHUNT  02-23-12   INSERTION OF RIGHT FRONTAL VENTRICULOPERITONEAL SHUNT WITH A CODMAN HAKIM PROGRAMMABLE VALVE   Patient Active Problem List   Diagnosis Date Noted   Benign paroxysmal positional vertigo of right ear 03/03/2023   COVID-19 03/03/2023   Abrasion, forearm with infection, left, initial encounter 03/03/2023   Physical deconditioning 12/20/2022   Poor  balance 12/20/2022   PVC's (premature ventricular contractions) 09/19/2022   Diplopia 09/19/2022   Parkinsonism 06/19/2022   Primary localized osteoarthrosis of shoulder region 05/10/2022   Recurrent dislocation of shoulder joint 05/10/2022   Regional enteritis (HCC) 05/10/2022   Vestibular hypofunction of left ear 05/10/2022   Sensorineural hearing loss 05/10/2022   Senile osteoporosis 02/03/2022   Pain in right hip 01/05/2022   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   Intertrigo 10/01/2021   HFrEF (heart failure with reduced ejection fraction) (HCC) 09/18/2021   Blepharitis of upper and lower eyelids of both eyes 09/18/2021   Tinea cruris 09/18/2021   Lewy body dementia (HCC) 09/16/2021   Closed fracture of right hip (HCC) 09/16/2021   S/P hip replacement, right  07/2021 09/16/2021   Presence of right artificial hip joint 08/06/2021   Vascular dementia (HCC) 04/29/2021   Multiple falls 04/29/2021   Cough 04/09/2021   Vitamin D deficiency 04/09/2021   Vitamin B1 deficiency 04/09/2021   Right arm pain 01/06/2021   Seizures (HCC)    Muscle cramping 12/30/2019   Dizziness 12/30/2019   Unsteady gait 12/30/2019   Pacemaker 11/27/2019   Fatigue 07/09/2019   SSS (sick sinus syndrome) (HCC) 07/02/2018   Bilateral shoulder pain 06/14/2018   Upper airway cough syndrome vs cough variant asthma 03/20/2018   Acute midline low back pain without sciatica 12/25/2017   Depression 10/17/2017   CAD (coronary artery disease), hx of NSTEMI 08/15/2017   Chest pain 07/17/2017   SOBOE (shortness of breath on exertion) 06/08/2017   Bilateral sensorineural hearing loss 06/01/2017   Nasal turbinate hypertrophy 06/01/2017   Dysphagia 05/31/2017   Osteoporosis 01/30/2017   History of cerebral hemorrhage 12/28/2016   Seborrheic dermatitis 07/25/2016   Cognitive communication deficit 06/28/2016   Hypertensive heart disease 06/28/2016   Vitamin B 12 deficiency 03/16/2016   Confusion 03/13/2016   VP  (ventriculoperitoneal) shunt status 02/12/2016   Orthostatic hypotension 01/05/2016   Benign prostatic hyperplasia with urinary obstruction 08/04/2014   Urinary urgency 08/04/2014   Gastroesophageal reflux disease 07/14/2014   Polymyalgia rheumatica (HCC) 05/28/2014   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/2014   SBE (subacute bacterial endocarditis) prophylaxis candidate 11/24/2013   Aphasia 07/17/2013   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 seizures, not intractable, without status epilepticus (HCC) 07/17/2013   Subdural hemorrhage (HCC) 07/17/2013   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HCC) 07/17/2013   Pre-diabetes 03/26/2013   Urinary frequency 10/31/2012   Nocturia 10/31/2012   History of cerebral aneurysm repair 10/18/2012   History of stroke without residual deficits 10/18/2012   Ocular rosacea 09/27/2012   Seizure disorder (HCC) 07/23/2012   Entropion 07/10/2012   Hyperopia with astigmatism 07/10/2012   Combined senile cataract 04/13/2012   Presbyopia 04/13/2012  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) 03/01/2012   Stroke (HCC) 03/01/2012   Cognitive safety issue 02/20/2012   Nonruptured cerebral aneurysm 11/23/2011   Obstructive sleep apnea syndrome 08/09/2011   Hyperlipidemia 02/03/2009   HTN (hypertension) 02/03/2009   History of Crohn's disease 04/17/2008   Allergic rhinitis, unspecified 07/05/1999   Cataract, right eye 07/05/1999    ONSET DATE: chronic   REFERRING DIAG: R26.89 (ICD-10-CM) - Poor balance R53.81 (ICD-10-CM) - Physical deconditioning  THERAPY DIAG:  Muscle weakness (generalized)  Unsteadiness on feet  Rationale for Evaluation and Treatment: Rehabilitation  SUBJECTIVE:                                                                                                                                                                                             SUBJECTIVE STATEMENT: I'm not doing great today.  Wife  says no changes since last PT visit.    Pt accompanied by: significant other  PERTINENT HISTORY: Aortic insufficiency, CRT-P pacemaker placement, cerebral aneurysm s/p clipping, cerebral hemorrhage and seizure disorder s/p VP shunt, CHF, CVA, second degree AV block Mobitz type 1 s/p PPM, HTN, HLD, hypertensive heart disease, IBS, lew body dementia, mitral regurgitation, NSVT, orthostatic hypotension, RA, SSS, subdural hematoma, hernia repair, cardiac cath  PAIN:  Are you having pain? Yes: NPRS scale: "B LEs and knees"/10 Pain location: "B LEs and knees"  Pain description: discomfort, some radicular pains down legs especially in the mornings  Aggravating factors: standing up, sitting unsupported Relieving factors: movement  PRECAUTIONS: Fall and ICD/Pacemaker, poor vision, STM impairments  RED FLAGS: None   WEIGHT BEARING RESTRICTIONS: No  FALLS: Has patient fallen in last 6 months? Yes. Number of falls 2 in the past 6 months, had additional falls >6 months ago; per spouse, falls a lot when bending to pick something up off the floor, fall with SDH and hip fracture in 2023 was due to slipping on wet grass outside   LIVING ENVIRONMENT: Lives with: lives with their family Lives in: House/apartment Stairs: Yes: Internal: 14 steps; one step to enter home  Has following equipment at home: Single point cane and "semi-hospital bed that adjusts the head, walker with 2 wheels, 4WW and WC, shower bench"   PLOF: Independent, Independent with basic ADLs, Requires assistive device for independence, and handheld shower head; per spouse "house is older and difficult to renovate, may try to convert bathroom to a shower stall  PATIENT GOALS: improve strength, balance, endurance   OBJECTIVE:   *Adjusted new walker to pt's current height (lowered by one notch)  TODAY'S TREATMENT: 03/27/2023 Activity Comments  Seated  ex -march in place 3 x 10 -LAQ 3 x 10 -ankle pumps 3 x 10 Added 2# weight for last  set  Gait with RW: 85 ft x 2 with min assist Gait 30 ft x 4 reps, RW Gait 30 ft, RW Cues for foot clearance, heelstrike and to slow pace/stay close in BOS of walker.  Pt tends to speed up  Sit to stand, 2 x 5 reps, UE support   sitting PWR! Moves Up 5x  Rock 5x  Twist 10x with boomwhackers  Step 10x with boomwhackers Demo throughout; pt with difficulty matching PT's pace and decreased amplitude of movement with repetition.  Took breaks and restarted with PT cues, for larger amplitude movement              PATIENT EDUCATION: (SELF CARE) Education details: Reviewed with pt and daughter to keep doing seated PWR! Moves added last visit; RW lowered by one notch and discussed cues to "kick" R foot to help pt stay in BOS of RW and to slow pace with gait; daughter asks about U-step rollator; discussed potential benefits of trialing this and may trial next visit  Person educated: Patient and Spouse Education method: Explanation, Demonstration, Tactile cues, Verbal cues, and Handouts Education comprehension: verbalized understanding and returned demonstration   HOME EXERCISE PROGRAM: Access Code: [email or web address] URL: [email or web address] Date: 03/13/2023 Prepared by: [facility] - Outpatient  Rehab - [facility]  Program Notes walking program: practice walking with the walker for 5 minutes inside the house every hour   Exercises - Seated March with Resistance  - 1 x daily - 5 x weekly - 2 sets - 10 reps - Seated Long Arc Quad  - 1 x daily - 5 x weekly - 3 sets - 10 reps - Seated Ankle Dorsiflexion AROM  - 1 x daily - 7 x weekly - 3 sets - 10 reps - 3 sec hold - Seated Heel Raise  - 1 x daily - 5 x weekly - 2 sets - 10 reps - Cervical Extension AROM with Strap  - 1 x daily - 7 x weekly - 3 sets - 10 reps - Side to Side Weight Shift with Overhead Reach and Counter Support  - 1 x daily - 7 x weekly - 3 sets - 10 reps - Standing Gastroc Stretch at Counter  - 1 x daily - 7 x  weekly - 3 sets - 30-60 sec hold   Added: sitting PWR! Moves handout  Use of boomwhackers/paper towel roll with twist and step 10x each    ----------------------------------------------------- Objective measures below taken at initial evaluation:  COGNITION: Overall cognitive status: History of cognitive impairments - at baseline       POSTURE: rounded shoulders, forward head, decreased lumbar lordosis, and increased thoracic kyphosis, flexed at hips     LOWER EXTREMITY MMT:    MMT Right Eval Left Eval  Hip flexion 3 3  Hip extension    Hip abduction 3 seated  3 seated  Hip adduction    Hip internal rotation    Hip external rotation    Knee flexion    Knee extension 4 4  Ankle dorsiflexion 3+ 3+  Ankle plantarflexion    Ankle inversion    Ankle eversion    (Blank rows = not tested)    TRANSFERS: Assistive device utilized: Single point cane  Sit to stand: SBA Stand to sit: SBA Chair to chair: SBA      GAIT: Gait pattern: step through  pattern, decreased arm swing- Right, decreased arm swing- Left, decreased step length- Right, decreased step length- Left, decreased stance time- Right, decreased stance time- Left, decreased stride length, decreased ankle dorsiflexion- Right, decreased ankle dorsiflexion- Left, Right foot flat, Left foot flat, shuffling, trendelenburg, decreased trunk rotation, trunk flexed, and wide BOS Distance walked: in clinic distances  Assistive device utilized: Single point cane Level of assistance: SBA Comments: slow and steady with SPC   FUNCTIONAL TESTS:  5 times sit to stand: 58 seconds use of UEs on chair  Timed up and go (TUG): 44 seconds, SPC  Gait speed 0.14 m/s with SPC   PATIENT SURVEYS:  FOTO next visit   TODAY'S TREATMENT:                                                                                                                              DATE:   Eval  Objective measures, care planning    PATIENT  EDUCATION: Education details: exam findings, POC, offered SLP and OT skilled services  Person educated: Patient and Spouse Education method: Explanation Education comprehension: verbalized understanding and needs further education  HOME EXERCISE PROGRAM: TBD   GOALS: Goals reviewed with patient? Yes  SHORT TERM GOALS: Target date: 04/03/2023    Will be compliant with appropriate progressive HEP with no more than MinA from family  Baseline: Goal status: IN PROGRESS  2.  Will improve gait speed to at least 0.60m/s with LRAD  Baseline:  Goal status: IN PROGRESS  3.  Will complete TUG test in 30 seconds or better with LRAD  Baseline:  Goal status: IN PROGRESS  4.  Will complete 5xSTS test in 45 seconds or less with UEs  Baseline:  Goal status: IN PROGRESS  5.  Will tolerate walking and/or activity in PT for at least 10 minutes without needing a rest break  Baseline:  Goal status: IN PROGRESS    LONG TERM GOALS: Target date: 05/15/2023    MMT to improve by one grade in all weak groups  Baseline:  Goal status: IN PROGRESS  2.  Will be able to ambulate at least 336ft in (0.76m/s) with LRAD to show improved community access no rest breaks   Baseline:  Goal status: IN PROGRESS  3.  Will score at least 42 on the Berg to show reduced fall risk  Baseline:  Goal status: IN PROGRESS  4.  Will complete 5xSTS test in 20 seconds or less with UEs to show improved functional mobility  Baseline:  Goal status: IN PROGRESS  5.  Will be compliant with appropriate progressive long term exercise plan with no more than MinA from family to combat sedentary lifestyle and maintain functional gains from PT  Baseline:  Goal status: IN PROGRESS    ASSESSMENT:  CLINICAL IMPRESSION: Skilled PT session today focused on continuing BLE strengthening, review of seated PWR! Moves and gait training today.  Performed several bouts of gait with RW, with  pt experiencing hastening and  decreased foot clearance after about 20 ft of gait; PT provides manual and verbal cues to slow pace and for "kick" with RLE to generate increased amplitude for foot clearance.  This pattern does not last once cues removed.  Daughter and wife present at end of session and daugther asks about use of U-step RW to help with step length and slowed pace.  This may be an option to help with gait, so we may try next session. OBJECTIVE IMPAIRMENTS: Abnormal gait, decreased activity tolerance, decreased balance, decreased cognition, decreased coordination, decreased knowledge of use of DME, decreased mobility, difficulty walking, decreased strength, decreased safety awareness, postural dysfunction, and obesity.   ACTIVITY LIMITATIONS: lifting, bending, standing, squatting, stairs, transfers, bed mobility, and locomotion level  PARTICIPATION LIMITATIONS: meal prep, cleaning, shopping, community activity, yard work, and church  PERSONAL FACTORS: Age, Fitness, Past/current experiences, Social background, and Time since onset of injury/illness/exacerbation are also affecting patient's functional outcome.   REHAB POTENTIAL: Good  CLINICAL DECISION MAKING: Evolving/moderate complexity  EVALUATION COMPLEXITY: Moderate  PLAN:  PT FREQUENCY: 2x/week  PT DURATION: 12 weeks  PLANNED INTERVENTIONS: Therapeutic exercises, Therapeutic activity, Neuromuscular re-education, Balance training, Gait training, Patient/Family education, Self Care, Stair training, DME instructions, Aquatic Therapy, Dry Needling, Cognitive remediation, Cryotherapy, Moist heat, Manual therapy, and Re-evaluation  PLAN FOR NEXT SESSION: Standing at locked/steady RW:  marching in place/kicks to see about improved foot clearance.  Trial of U-step Rollator to see if use of laser line helps with step length/foot clearance.  Continue to review sitting PWR! Moves and progress with reps and amplitude of movement.   Utilize NuStep, Work on strength,  balance, endurance as tolerated.    Lonia Blood, PT 03/27/23 5:32 PM Phone: [phone] Fax: [phone]  [facility] at [facility] [address] [place], [address] [place], [place] [address] Phone # [phone] Fax # [phone]

## 2023-03-27 NOTE — Therapy (Signed)
OUTPATIENT SPEECH LANGUAGE PATHOLOGY [facility]'S EVALUATION   Patient Name: Jeff Weaver MRN: [id number] DOB:11/07/1945, 77 y.o., male Today's Date: 03/27/2023  PCP: Cheryll Cockayne, MD REFERRING PROVIDER: Same as PCP  END OF SESSION:  End of Session - 03/27/23 1717     Visit Number 3    Number of Visits 25    Date for SLP Re-Evaluation 06/13/23    SLP Start Time 1624   In restroom at 1618   SLP Stop Time  1700    SLP Time Calculation (min) 36 min    Activity Tolerance Patient tolerated treatment well              Past Medical History:  Diagnosis Date   Aortic insufficiency    Atypical chest pain    Cardiac resynchronization therapy pacemaker (CRT-P) in place    Cerebral aneurysm    a. s/p clipping at [facility] in 8/13 c/b short-term memory loss, cerebral hemorrhage and seizure disorder s/p VP shunt.   Cerebral hemorrhage (HCC)    Chronic combined systolic and diastolic CHF (congestive heart failure) (HCC)    CVA (cerebral infarction)    Diverticulosis    Enteritis (regional)    Dr Juanda Chance   First degree AV block    GI bleed    HTN (hypertension)    Hyperlipidemia    Hypertensive heart disease    IBS (irritable bowel syndrome)    Lewy body dementia (HCC)    Mild CAD    Cardiac cath 01/12/18 showed minimal nonobstructive CAD, normal LVEF, normal LVEDP.   Mild dilation of ascending aorta (HCC)    Mitral regurgitation    NSVT (nonsustained ventricular tachycardia) (HCC)    Orthostatic hypotension    Pacemaker    Pre-diabetes    PVC's (premature ventricular contractions)    Regional enteritis of large intestine (HCC) since 1978   Rheumatoid arthritis(714.0)    dxed in [employer] in 1980s   Second degree AV block, Mobitz type I    Seizures (HCC)    last April 26,2021   Sinus bradycardia    Sleep apnea    SSS (sick sinus syndrome) (HCC)    Subdural hematoma (HCC)    Past Surgical History:  Procedure Laterality Date   BIV PACEMAKER INSERTION CRT-P N/A 03/10/2021    Procedure: BIV PACEMAKER INSERTION CRT-P UPGRADE;  Surgeon: Regan Lemming, MD;  Location: [facility] INVASIVE CV LAB;  Service: Cardiovascular;  Laterality: N/A;   CHOLECYSTECTOMY N/A 01/07/2016   Procedure: LAPAROSCOPIC CHOLECYSTECTOMY ;  Surgeon: Abigail Miyamoto, MD;  Location: [facility] OR;  Service: General;  Laterality: N/A;   cns shunt  02/23/12   COLONOSCOPY W/ POLYPECTOMY  1978   negative 2009, Dr Juanda Chance. Due 2014   CRANIOTOMY  02/02/12   Dr Kelby Aline, [facility]-clipping of aneurysm   CRANIOTOMY  02-02-12   left pterional craniotomy for clipping complex anterior communicating artery aneurysm    HERNIA REPAIR     LEFT HEART CATH AND CORONARY ANGIOGRAPHY N/A 01/12/2018   Procedure: LEFT HEART CATH AND CORONARY ANGIOGRAPHY;  Surgeon: Swaziland, Peter M, MD;  Location: [facility] INVASIVE CV LAB;  Service: Cardiovascular;  Laterality: N/A;   LEFT HEART CATHETERIZATION WITH CORONARY ANGIOGRAM N/A 11/26/2013   Procedure: LEFT HEART CATHETERIZATION WITH CORONARY ANGIOGRAM;  Surgeon: Marykay Lex, MD;  Location: [facility] CATH LAB;  Service: Cardiovascular;  Laterality: N/A;   NOSE SURGERY     PACEMAKER IMPLANT N/A 07/02/2018   Procedure: PACEMAKER IMPLANT;  Surgeon: Regan Lemming, MD;  Location: [facility]  INVASIVE CV LAB;  Service: Cardiovascular;  Laterality: N/A;   SHOULDER SURGERY  1997   TONSILLECTOMY AND ADENOIDECTOMY     VENTRICULOPERITONEAL SHUNT  02-23-12   INSERTION OF RIGHT FRONTAL VENTRICULOPERITONEAL SHUNT WITH A CODMAN HAKIM PROGRAMMABLE VALVE   Patient Active Problem List   Diagnosis Date Noted   Benign paroxysmal positional vertigo of right ear 03/03/2023   COVID-19 03/03/2023   Abrasion, forearm with infection, left, initial encounter 03/03/2023   Physical deconditioning 12/20/2022   Poor balance 12/20/2022   PVC's (premature ventricular contractions) 09/19/2022   Diplopia 09/19/2022   Parkinsonism 06/19/2022   Primary localized osteoarthrosis of shoulder region 05/10/2022   Recurrent dislocation of  shoulder joint 05/10/2022   Regional enteritis (HCC) 05/10/2022   Vestibular hypofunction of left ear 05/10/2022   Sensorineural hearing loss 05/10/2022   Senile osteoporosis 02/03/2022   Pain in right hip 01/05/2022   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   Intertrigo 10/01/2021   HFrEF (heart failure with reduced ejection fraction) (HCC) 09/18/2021   Blepharitis of upper and lower eyelids of both eyes 09/18/2021   Tinea cruris 09/18/2021   Lewy body dementia (HCC) 09/16/2021   Closed fracture of right hip (HCC) 09/16/2021   S/P hip replacement, right  07/2021 09/16/2021   Presence of right artificial hip joint 08/06/2021   Vascular dementia (HCC) 04/29/2021   Multiple falls 04/29/2021   Cough 04/09/2021   Vitamin D deficiency 04/09/2021   Vitamin B1 deficiency 04/09/2021   Right arm pain 01/06/2021   Seizures (HCC)    Muscle cramping 12/30/2019   Dizziness 12/30/2019   Unsteady gait 12/30/2019   Pacemaker 11/27/2019   Fatigue 07/09/2019   SSS (sick sinus syndrome) (HCC) 07/02/2018   Bilateral shoulder pain 06/14/2018   Upper airway cough syndrome vs cough variant asthma 03/20/2018   Acute midline low back pain without sciatica 12/25/2017   Depression 10/17/2017   CAD (coronary artery disease), hx of NSTEMI 08/15/2017   Chest pain 07/17/2017   SOBOE (shortness of breath on exertion) 06/08/2017   Bilateral sensorineural hearing loss 06/01/2017   Nasal turbinate hypertrophy 06/01/2017   Dysphagia 05/31/2017   Osteoporosis 01/30/2017   History of cerebral hemorrhage 12/28/2016   Seborrheic dermatitis 07/25/2016   Cognitive communication deficit 06/28/2016   Hypertensive heart disease 06/28/2016   Vitamin B 12 deficiency 03/16/2016   Confusion 03/13/2016   VP (ventriculoperitoneal) shunt status 02/12/2016   Orthostatic hypotension 01/05/2016   Benign prostatic hyperplasia with urinary obstruction 08/04/2014   Urinary urgency 08/04/2014   Gastroesophageal reflux disease  07/14/2014   Polymyalgia rheumatica (HCC) 05/28/2014   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/2014   SBE (subacute bacterial endocarditis) prophylaxis candidate 11/24/2013   Aphasia 07/17/2013   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 seizures, not intractable, without status epilepticus (HCC) 07/17/2013   Subdural hemorrhage (HCC) 07/17/2013   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HCC) 07/17/2013   Pre-diabetes 03/26/2013   Urinary frequency 10/31/2012   Nocturia 10/31/2012   History of cerebral aneurysm repair 10/18/2012   History of stroke without residual deficits 10/18/2012   Ocular rosacea 09/27/2012   Seizure disorder (HCC) 07/23/2012   Entropion 07/10/2012   Hyperopia with astigmatism 07/10/2012   Combined senile cataract 04/13/2012   Presbyopia 04/13/2012   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) 03/01/2012   Stroke (HCC) 03/01/2012   Cognitive safety issue 02/20/2012   Nonruptured cerebral aneurysm 11/23/2011   Obstructive sleep apnea syndrome 08/09/2011   Hyperlipidemia 02/03/2009   HTN (hypertension) 02/03/2009   History  of Crohn's disease 04/17/2008   Allergic rhinitis, unspecified 07/05/1999   Cataract, right eye 07/05/1999    ONSET DATE: chronic disease- script dated 02/21/23  REFERRING DIAG: R41.841 (ICD-10-CM) - Cognitive communication deficit G31.83,F02.80 (ICD-10-CM) - Lewy body dementia, unspecified dementia severity, unspecified whether behavioral, psychotic, or mood disturbance or anxiety (HCC) I63.512 (ICD-10-CM) - 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  PAIN:  Are you having pain? Yes: NPRS scale: 6/10 Pain location: bil knees  FALLS: Has patient fallen in last 6 months?  See PT evaluation for details   PATIENT GOALS: Increase speech clarity, assistance with food clearance  OBJECTIVE:   MBS [facility] April 2024: Patient with mild oropharyngeal dysphagia, like secondary to age-related changes to the swallowing mechanism and generalized deconditioning. Dysphagia is primarily characterized by mistiming of the pharyngeal swallow and reduced supraglottic closure, resulting in penetration of thin liquids during the swallow with intermittent silent aspiration on progression of cord-level residual. Cued post-swallow throat clearing was effective at mitigating penetrants. PAS scores below. Bolus propulsion and UES distension is largely intact. Based on today's examination, recommend patient continue regular solids/thin liquids, pills one at a time, with adherence to aspiration precautions and swallowing strategies including alternation of liquids and solids, SINGLE sips at a time, and regular post-swallow throat clearing after the swallow. Provided risks and adverse outcomes associated with dysphagia and aspiration (i.e possible life-threatening pneumonia) and rationale for adherence to management recommendations. Did provide signs of worsening dysphagia and return to clinic criteria. Will re-evaluate in 6 months, certainly sooner if indicated. Patient and family verbalized understanding of all exam results and recommendations.  Recommendations: Diet: Regular Aspiration precautions:  sit upright during all PO (as close to 90 degrees as possible) alternate bites/sips cough/clear throat regularly during the meal SINGLE sips  Medications: one at a time   PATIENT REPORTED OUTCOME MEASURES (PROM): Communication Effectiveness Survey: to be provided in first 1-2 sessions  TODAY'S TREATMENT:                                                                                                                                          DATE:  03/27/23: MBS scheduled for 04/07/23. SLP cued pt if he was having difficulty with drooling to swallow first and then wipe excess saliva. SLP provided a CES today to [place] and asked her to complete and return next session. SLP worked with pt's loudness today, first in everyday sentences pt with average 83 dB; in sentence completion tasks averaged 74dB with min A <10% for increasing loudness. In simiarity/difference task,  pt's average loudness was 72dB however pt's responses did not match stimulus 25% of the time and SLP had to provide cues for pt to correctly answer, sometimes using max cues. Suspect fatigue played a role with this, as ST was pt's last therapy today.   03/22/23: Pt needs CES next session.  SLP introduced pt and wife to everyday sentences. Pt thought of 5 and wife generated 4. SLP instructed pt to recite these twice a day in a loud, strong voice, and explained rationale for pt and wife. Pt questioned rationale and SLP drew a diagram with WNL volume and pt's conversational volume and reiterated when he talks loudly it brings his volume into/closer to WNL range. 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 SLP encouraged him that this was WNL volume for a portion of his last conversational turn.  03/15/23 (eval): n/a  PATIENT EDUCATION: Education details: See "today's treatment" for details Person educated: Patient and Spouse Education method: Explanation, Demonstration, and Verbal cues Education comprehension: verbalized understanding and needs further education  HOME EXERCISE PROGRAM: TBD   GOALS: Goals reviewed with patient? Yes (in general)  SHORT TERM GOALS: Target date: 04/14/23  Pt will complete MBS to assess  current swallowing status and assess safe swallow strategies Baseline: Goal status: INITIAL  2.  Pt will maintain average mid-upper 80s for loud /a/ in 3 sessions Baseline:  Goal status: INITIAL  3.  Pt will use speech volume average upper 60s dB for 5 minutes simple conversation in two sessions Baseline:  Goal status: INITIAL  4.  pt will generate abdominal breathing at rest (seated position) 80% of the time over 2 sessions Baseline:  Goal status: INITIAL   LONG TERM GOALS: Target date: 05/12/23  Pt will improve PROM measure scores in the last 1-2 sessions Baseline:  Goal status: INITIAL  2.  Pt will maintain average upper 80s for loud /a/ in 3 sessions Baseline:  Goal status: INITIAL  3.  Pt will use speech volume average low 70s dB for 8 minutes simple-mod complex  conversation with occasional verbal cues in two sessions Baseline:  Goal status: INITIAL  4.   Pt will generate abdominal breathing 60% of the time in 8 minutes simple-mod complex conversation over 2 sessions Baseline:  Goal status: INITIAL   ASSESSMENT:  CLINICAL IMPRESSION: Patient is a 77 y.o. M who was seen today for treatment of speech intelligibility in light of Parkinsons disease. Goals for swallowing TBD following MBS on 04/07/23. See "today's treatment" for more details. T "They are always telling me to speak up," pt stated to SLP on eval date. Pt's OT to target compensations for cognition.  OBJECTIVE IMPAIRMENTS: Objective impairments include memory, dysarthria, and dysphagia. These impairments are limiting patient from ADLs/IADLs, effectively communicating at home and in community, and safety when swallowing.Factors affecting potential to achieve goals and functional outcome are ability to learn/carryover information and co-morbidities.. Patient will benefit from skilled SLP services to address above impairments and improve overall function.  REHAB POTENTIAL: Good  PLAN:  SLP FREQUENCY:  2x/week  SLP DURATION: 8 weeks  PLANNED INTERVENTIONS: Aspiration precaution training, Pharyngeal strengthening exercises, Diet toleration management , Environmental controls, Trials of upgraded texture/liquids, Internal/external aids, Oral motor exercises, Functional tasks, SLP instruction and feedback, Compensatory strategies, and Patient/family education    [facility], CCC-SLP 03/27/2023, 5:18 PM

## 2023-03-27 NOTE — Therapy (Signed)
OUTPATIENT OCCUPATIONAL THERAPY NEURO  Treatment Note  Patient Name: Jeff Weaver MRN: [id number] DOB:[birth year], 77 y.o., male Today's Date: 03/27/2023  PCP: Pincus Sanes, MD REFERRING PROVIDER: Pincus Sanes, MD  END OF SESSION:  OT End of Session - 03/27/23 1537     Visit Number 3    Number of Visits 17    Date for OT Re-Evaluation 05/12/23    Authorization Type Medicare A&B    OT Start Time 1535    OT Stop Time 1615    OT Time Calculation (min) 40 min               Past Medical History:  Diagnosis Date   Aortic insufficiency    Atypical chest pain    Cardiac resynchronization therapy pacemaker (CRT-P) in place    Cerebral aneurysm    a. s/p clipping at [facility] in 8/13 c/b short-term memory loss, cerebral hemorrhage and seizure disorder s/p VP shunt.   Cerebral hemorrhage (HCC)    Chronic combined systolic and diastolic CHF (congestive heart failure) (HCC)    CVA (cerebral infarction)    Diverticulosis    Enteritis (regional)    Dr Juanda Chance   First degree AV block    GI bleed    HTN (hypertension)    Hyperlipidemia    Hypertensive heart disease    IBS (irritable bowel syndrome)    Lewy body dementia (HCC)    Mild CAD    Cardiac cath 01/12/18 showed minimal nonobstructive CAD, normal LVEF, normal LVEDP.   Mild dilation of ascending aorta (HCC)    Mitral regurgitation    NSVT (nonsustained ventricular tachycardia) (HCC)    Orthostatic hypotension    Pacemaker    Pre-diabetes    PVC's (premature ventricular contractions)    Regional enteritis of large intestine (HCC) since 1978   Rheumatoid arthritis(714.0)    dxed in [employer] in 1980s   Second degree AV block, Mobitz type I    Seizures (HCC)    last April 26,2021   Sinus bradycardia    Sleep apnea    SSS (sick sinus syndrome) (HCC)    Subdural hematoma (HCC)    Past Surgical History:  Procedure Laterality Date   BIV PACEMAKER INSERTION CRT-P N/A 03/10/2021   Procedure: BIV PACEMAKER INSERTION CRT-P  UPGRADE;  Surgeon: Regan Lemming, MD;  Location: [facility] INVASIVE CV LAB;  Service: Cardiovascular;  Laterality: N/A;   CHOLECYSTECTOMY N/A 01/07/2016   Procedure: LAPAROSCOPIC CHOLECYSTECTOMY ;  Surgeon: Abigail Miyamoto, MD;  Location: [facility] OR;  Service: General;  Laterality: N/A;   cns shunt  02/23/12   COLONOSCOPY W/ POLYPECTOMY  1978   negative 2009, Dr Juanda Chance. Due 2014   CRANIOTOMY  02/02/12   Dr Kelby Aline, [facility]-clipping of aneurysm   CRANIOTOMY  02-02-12   left pterional craniotomy for clipping complex anterior communicating artery aneurysm    HERNIA REPAIR     LEFT HEART CATH AND CORONARY ANGIOGRAPHY N/A 01/12/2018   Procedure: LEFT HEART CATH AND CORONARY ANGIOGRAPHY;  Surgeon: Swazil[place], Peter M, MD;  Location: [facility] INVASIVE CV LAB;  Service: Cardiovascular;  Laterality: N/A;   LEFT HEART CATHETERIZATION WITH CORONARY ANGIOGRAM N/A 11/26/2013   Procedure: LEFT HEART CATHETERIZATION WITH CORONARY ANGIOGRAM;  Surgeon: Marykay Lex, MD;  Location: [facility] CATH LAB;  Service: Cardiovascular;  Laterality: N/A;   NOSE SURGERY     PACEMAKER IMPLANT N/A 07/02/2018   Procedure: PACEMAKER IMPLANT;  Surgeon: Regan Lemming, MD;  Location: [facility] INVASIVE CV LAB;  Service: Cardiovascular;  Laterality: N/A;   SHOULDER SURGERY  1997   TONSILLECTOMY AND ADENOIDECTOMY     VENTRICULOPERITONEAL SHUNT  02-23-12   INSERTION OF RIGHT FRONTAL VENTRICULOPERITONEAL SHUNT WITH A CODMAN HAKIM PROGRAMMABLE VALVE   Patient Active Problem List   Diagnosis Date Noted   Benign paroxysmal positional vertigo of right ear 03/03/2023   COVID-19 03/03/2023   Abrasion, forearm with infection, left, initial encounter 03/03/2023   Physical deconditioning 12/20/2022   Poor balance 12/20/2022   PVC's (premature ventricular contractions) 09/19/2022   Diplopia 09/19/2022   Parkinsonism 06/19/2022   Primary localized osteoarthrosis of shoulder region 05/10/2022   Recurrent dislocation of shoulder joint 05/10/2022   Regional  enteritis (HCC) 05/10/2022   Vestibular hypofunction of left ear 05/10/2022   Sensorineural hearing loss 05/10/2022   Senile osteoporosis 02/03/2022   Pain in right hip 01/05/2022   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   Intertrigo 10/01/2021   HFrEF (heart failure with reduced ejection fraction) (HCC) 09/18/2021   Blepharitis of upper and lower eyelids of both eyes 09/18/2021   Tinea cruris 09/18/2021   Lewy body dementia (HCC) 09/16/2021   Closed fracture of right hip (HCC) 09/16/2021   S/P hip replacement, right  07/2021 09/16/2021   Presence of right artificial hip joint 08/06/2021   Vascular dementia (HCC) 04/29/2021   Multiple falls 04/29/2021   Cough 04/09/2021   Vitamin D deficiency 04/09/2021   Vitamin B1 deficiency 04/09/2021   Right arm pain 01/06/2021   Seizures (HCC)    Muscle cramping 12/30/2019   Dizziness 12/30/2019   Unsteady gait 12/30/2019   Pacemaker 11/27/2019   Fatigue 07/09/2019   SSS (sick sinus syndrome) (HCC) 07/02/2018   Bilateral shoulder pain 06/14/2018   Upper airway cough syndrome vs cough variant asthma 03/20/2018   Acute midline low back pain without sciatica 12/25/2017   Depression 10/17/2017   CAD (coronary artery disease), hx of NSTEMI 08/15/2017   Chest pain 07/17/2017   SOBOE (shortness of breath on exertion) 06/08/2017   Bilateral sensorineural hearing loss 06/01/2017   Nasal turbinate hypertrophy 06/01/2017   Dysphagia 05/31/2017   Osteoporosis 01/30/2017   History of cerebral hemorrhage 12/28/2016   Seborrheic dermatitis 07/25/2016   Cognitive communication deficit 06/28/2016   Hypertensive heart disease 06/28/2016   Vitamin B 12 deficiency 03/16/2016   Confusion 03/13/2016   VP (ventriculoperitoneal) shunt status 02/12/2016   Orthostatic hypotension 01/05/2016   Benign prostatic hyperplasia with urinary obstruction 08/04/2014   Urinary urgency 08/04/2014   Gastroesophageal reflux disease 07/14/2014   Polymyalgia rheumatica (HCC)  05/28/2014   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/2014   SBE (subacute bacterial endocarditis) prophylaxis candidate 11/24/2013   Aphasia 07/17/2013   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 seizures, not intractable, without status epilepticus (HCC) 07/17/2013   Subdural hemorrhage (HCC) 07/17/2013   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HCC) 07/17/2013   Pre-diabetes 03/26/2013   Urinary frequency 10/31/2012   Nocturia 10/31/2012   History of cerebral aneurysm repair 10/18/2012   History of stroke without residual deficits 10/18/2012   Ocular rosacea 09/27/2012   Seizure disorder (HCC) 07/23/2012   Entropion 07/10/2012   Hyperopia with astigmatism 07/10/2012   Combined senile cataract 04/13/2012   Presbyopia 04/13/2012   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) 03/01/2012   Stroke (HCC) 03/01/2012   Cognitive safety issue 02/20/2012   Nonruptured cerebral aneurysm 11/23/2011   Obstructive sleep apnea syndrome 08/09/2011   Hyperlipidemia 02/03/2009   HTN (hypertension) 02/03/2009   History of Crohn's disease 04/17/2008  Allergic rhinitis, unspecified 07/05/1999   Cataract, right eye 07/05/1999    ONSET DATE: referral date 02/21/23  REFERRING DIAG: R41.841 (ICD-10-CM) - Cognitive communication deficit G31.83,F02.80 (ICD-10-CM) - Lewy body dementia, unspecified dementia severity, unspecified whether behavioral, psychotic, or mood disturbance or anxiety (HCC) I63.512 (ICD-10-CM) - 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 Pt accompanied by: self and family member (spouse and adult daughter)  PERTINENT HISTORY: Aortic insufficiency, CRT-P pacemaker placement, cerebral aneurysm s/p clipping, cerebral hemorrhage and seizure disorder s/p VP shunt, CHF, CVA, second degree AV block Mobitz type 1 s/p PPM, HTN, HLD, hypertensive heart disease, IBS, lew body dementia, mitral regurgitation, NSVT, orthostatic hypotension, RA, SSS, subdural hematoma, hernia repair, cardiac cath  PRECAUTIONS: Fall, ICD/Pacemaker, and Other: poor vision, STM impairments  WEIGHT BEARING RESTRICTIONS: No  PAIN:  Are you having pain? Yes: NPRS scale: 6/10 Pain location: B knees R > L, back Pain description: discomfort Aggravating factors: prolonged sitting Relieving factors: movement  FALLS: Has patient fallen in last 6 months? Yes. Number of falls 2-3, getting in/out of bed or when turning to pick something up   LIVING ENVIRONMENT: Lives with: lives with their family (daughter comes and goes, will spend a few days per week in the home with them). Lives in: House/apartment Stairs: Yes: Internal: 14 steps; and External: 1 step to enter home  Has following equipment at home: Dan Humphreys - 2 wheeled, Environmental consultant - 4 wheeled, Tour manager, and Grab bars  PLOF: Independent and Independent with basic ADLs  PATIENT GOALS: improve endurance and engagement in ADLs  OBJECTIVE:   HAND DOMINANCE: Right  ADLs: Transfers/ambulation related to ADLs: Mod I to supervision with RW Eating: spouse typically cuts foods for him, pt will lean towards food instead of bringing food to mouth, spoon is preferred utensil Grooming: increased time UB Dressing: increased time, is ableto manage buttons LB Dressing: wears disposable underwear and pull on pants for increased ease Toileting: Mod I, does have a grab bar in front that he will pull up on; difficulty with hygiene post BM Bathing: spouse assists with bathing, requires encouragement to  bathe Tub Shower transfers: spouse assists with transfers Equipment: Transfer tub bench and Grab bars  IADLs: Medication management: spouse fills pill box and will remind him to take  MOBILITY STATUS: Needs Assist: Mod I to Supervision with RW and Hx of falls; required min assist to stand from chair without arm rests  POSTURE COMMENTS:  rounded shoulders, forward head, decreased lumbar lordosis, increased thoracic kyphosis, and right head tilt  ACTIVITY TOLERANCE: Activity tolerance: diminished  UPPER EXTREMITY ROM:    Active ROM Right eval Left eval  Shoulder flexion 90 110  Shoulder abduction    Shoulder adduction    Shoulder extension    Shoulder internal rotation 80% 70%  Shoulder external rotation 80% 80%  Elbow flexion    Elbow extension    Wrist flexion    Wrist extension    Wrist ulnar deviation    Wrist radial deviation    Wrist pronation    Wrist supination    (Blank rows = not tested)  HAND FUNCTION: TBD  COORDINATION: 03/22/23  9 Hole Peg test: Right: 53.19 sec; Left: 1:17.28 sec Box and Blocks:  Right 26 blocks, Left 23 blocks  COGNITION: Overall cognitive status: History of cognitive impairments - at baseline Brief Interview for Mental Status:   Memory:  Word Repetition:   "Sock"  "Blue"  "Bed"   Orientation Level:    Year:   2024  Month: Sept  Day of Week: Weds  Memory:  Recall:     Unable to recall "Sock"  with cue   Able to recall  "Blue"  without cue   Able to recall  "Bed"   with cue     No Recall   VISION: Subjective report: diplopia.  Recently went to ophthalmologist who applied a Fresnel prism 8 base in on the right lens of his readers. Baseline vision: Wears glasses for reading only  VISION ASSESSMENT: Impaired To be further assessed in functional context   TODAY'S TREATMENT:                                                                        DATE: 03/27/23 Remote control: discussed adaptive remotes with large buttons,  with and without numbers.  Discussed calling [employer] to see if they have an easier model.   adaptive techniques for LB dressing, - attempted sit > stand with use of gait belt *** discussed modified figure 4 position, use of step stool, alternating UE support on RW     03/22/23 Discussed sustained attention during task as pt interrupting tasks to tell stories and/or further conversation which may be impacting his speed as well as his attention to task.  Reiterated recommendation for decreasing distractions as able to allow for increased focus on tasks. Coordination 9 Hole Peg test: Right: 53.19 sec; Left: 1:17.28 sec Box and Blocks:  Right 26 blocks, Left 23 blocks Vision/attention: engaged in trail making, completed in 2:42 stopping ~3x to process and scan for next number.  Pt with one error going from 22 >19> 24.  Pt also not touching 30% of numbers, due to diplopia and missing numbers to the Left of a portion of numbers.   Discussing dressing routine, setup. Pt dressing in the bathroom after bathing or after having an accident.  OT providing demonstrating and education on modified positioning and techniques to increase success with LB dressing.  Discussed use of reacher, figure 4 position, and/or use of step stool to rest foot on during LB dressing.   03/15/23 NA, eval only  PATIENT EDUCATION: Education details: ongoing condition specific education Person educated: Patient, Spouse, and Child(ren) Education method: Explanation Education comprehension: verbalized understanding and needs further education  HOME EXERCISE PROGRAM: TBD   GOALS: Goals reviewed with patient? Yes  SHORT TERM GOALS: Target date: 04/14/23  Pt and caregiver will be independent with gentle ROM HEP for improved functional use of BUE. Baseline: Goal status: IN PROGRESS  2.  Pt will verbalize understanding of task modifications and/or potential AE needs to increase ease, safety, and independence w/  ADLs Baseline:  Goal status: IN PROGRESS  3.  Pt will verbalize understanding of use of new glasses with Fresnel lenses for increased visual perception as needed for ADLs and IADLs. Baseline:  Goal status: IN PROGRESS   LONG TERM  GOALS: Target date: 05/12/23  Pt will demonstrate and/or report ability to complete LB dressing to include footwear at Mod I level utilizing adaptive techniques and/or AE PRN. Baseline:  Goal status: IN PROGRESS  2.  Pt will be able to complete toileting hygiene at Mod I level with adaptive techniques and/or AE PRN. Baseline:  Goal status: IN PROGRESS  3.  Pt will demonstrate improved internal rotation as needed for clothing management and hygiene post toileting. Baseline:  Goal status: IN PROGRESS  4.  Pt will demonstrate improved sustained attention to task with ability to complete table top task for 10 mins with 1 or fewer cues for attention to task. Baseline:  Goal status: IN PROGRESS  ASSESSMENT:  CLINICAL IMPRESSION: Pt continues to require increased time for coordination and visual scanning tasks, discussed decreasing distractions as able to facilitate increased focus on tasks.  Reiterated increased time for processing and motor planning.  OT introduced variety of techniques for LB dressing with pt to attempt at home and wife to bring in pictures of bathroom setup to aid in modifications of routines with LB dressing and other self-care tasks.  PERFORMANCE DEFICITS: in functional skills including ADLs, IADLs, ROM, pain, flexibility, Gross motor control, balance, body mechanics, endurance, and decreased knowledge of use of DME, cognitive skills including attention, memory, problem solving, and safety awareness, and psychosocial skills including coping strategies, environmental adaptation, and habits.     PLAN:  OT FREQUENCY: 2x/week  OT DURATION: 8 weeks  PLANNED INTERVENTIONS: self care/ADL training, therapeutic exercise, therapeutic activity,  neuromuscular re-education, passive range of motion, balance training, functional mobility training, ultrasound, compression bandaging, moist heat, cryotherapy, patient/family education, cognitive remediation/compensation, visual/perceptual remediation/compensation, psychosocial skills training, energy conservation, coping strategies training, and DME and/or AE instructions  RECOMMENDED OTHER SERVICES: NA  CONSULTED AND AGREED WITH PLAN OF CARE: Patient and family member/caregiver  PLAN FOR NEXT SESSION: adaptive techniques for LB dressing,sit > stand   Kainat Pizana, OTR/L 03/27/2023, 4:22 PM

## 2023-03-29 ENCOUNTER — Ambulatory Visit: Payer: Medicare Other

## 2023-03-29 ENCOUNTER — Ambulatory Visit: Payer: Medicare Other | Admitting: Occupational Therapy

## 2023-03-29 DIAGNOSIS — M6281 Muscle weakness (generalized): Secondary | ICD-10-CM

## 2023-03-29 DIAGNOSIS — R2689 Other abnormalities of gait and mobility: Secondary | ICD-10-CM

## 2023-03-29 DIAGNOSIS — R41840 Attention and concentration deficit: Secondary | ICD-10-CM

## 2023-03-29 DIAGNOSIS — R293 Abnormal posture: Secondary | ICD-10-CM | POA: Diagnosis not present

## 2023-03-29 DIAGNOSIS — R2681 Unsteadiness on feet: Secondary | ICD-10-CM

## 2023-03-29 DIAGNOSIS — R1310 Dysphagia, unspecified: Secondary | ICD-10-CM

## 2023-03-29 DIAGNOSIS — R471 Dysarthria and anarthria: Secondary | ICD-10-CM

## 2023-03-29 DIAGNOSIS — I69918 Other symptoms and signs involving cognitive functions following unspecified cerebrovascular disease: Secondary | ICD-10-CM | POA: Diagnosis not present

## 2023-03-29 LAB — OVA AND PARASITE EXAMINATION
CONCENTRATE RESULT:: NONE SEEN
MICRO NUMBER:: 15483484
SPECIMEN QUALITY:: ADEQUATE
TRICHROME RESULT:: NONE SEEN

## 2023-03-29 NOTE — Therapy (Signed)
OUTPATIENT PHYSICAL THERAPY NEURO TREATMENT   Patient Name: Jeff Weaver MRN: [id number] DOB:14-Sep-1945, 77 y.o., male Today's Date: 03/29/2023   PCP: Pincus Sanes, MD REFERRING PROVIDER: Pincus Sanes, MD  END OF SESSION:  PT End of Session - 03/29/23 1534     Visit Number 7    Number of Visits 25    Date for PT Re-Evaluation 05/15/23    Authorization Type MCR/[employer]    Authorization Time Period 02/20/23 to 05/15/23    Progress Note Due on Visit 10    PT Start Time 1530    PT Stop Time 1615    PT Time Calculation (min) 45 min    Equipment Utilized During Treatment Gait belt    Activity Tolerance Patient tolerated treatment well    Behavior During Therapy [facility] for tasks assessed/performed                 Past Medical History:  Diagnosis Date   Aortic insufficiency    Atypical chest pain    Cardiac resynchronization therapy pacemaker (CRT-P) in place    Cerebral aneurysm    a. s/p clipping at [facility] in 8/13 c/b short-term memory loss, cerebral hemorrhage and seizure disorder s/p VP shunt.   Cerebral hemorrhage (HCC)    Chronic combined systolic and diastolic CHF (congestive heart failure) (HCC)    CVA (cerebral infarction)    Diverticulosis    Enteritis (regional)    Dr Juanda Chance   First degree AV block    GI bleed    HTN (hypertension)    Hyperlipidemia    Hypertensive heart disease    IBS (irritable bowel syndrome)    Lewy body dementia (HCC)    Mild CAD    Cardiac cath 01/12/18 showed minimal nonobstructive CAD, normal LVEF, normal LVEDP.   Mild dilation of ascending aorta (HCC)    Mitral regurgitation    NSVT (nonsustained ventricular tachycardia) (HCC)    Orthostatic hypotension    Pacemaker    Pre-diabetes    PVC's (premature ventricular contractions)    Regional enteritis of large intestine (HCC) since 1978   Rheumatoid arthritis(714.0)    dxed in [employer] in 1980s   Second degree AV block, Mobitz type I    Seizures (HCC)    last April 26,2021    Sinus bradycardia    Sleep apnea    SSS (sick sinus syndrome) (HCC)    Subdural hematoma (HCC)    Past Surgical History:  Procedure Laterality Date   BIV PACEMAKER INSERTION CRT-P N/A 03/10/2021   Procedure: BIV PACEMAKER INSERTION CRT-P UPGRADE;  Surgeon: Regan Lemming, MD;  Location: [facility] INVASIVE CV LAB;  Service: Cardiovascular;  Laterality: N/A;   CHOLECYSTECTOMY N/A 01/07/2016   Procedure: LAPAROSCOPIC CHOLECYSTECTOMY ;  Surgeon: Abigail Miyamoto, MD;  Location: [facility] OR;  Service: General;  Laterality: N/A;   cns shunt  02/23/12   COLONOSCOPY W/ POLYPECTOMY  1978   negative 2009, Dr Juanda Chance. Due 2014   CRANIOTOMY  02/02/12   Dr Kelby Aline, [facility]-clipping of aneurysm   CRANIOTOMY  02-02-12   left pterional craniotomy for clipping complex anterior communicating artery aneurysm    HERNIA REPAIR     LEFT HEART CATH AND CORONARY ANGIOGRAPHY N/A 01/12/2018   Procedure: LEFT HEART CATH AND CORONARY ANGIOGRAPHY;  Surgeon: Swaziland, Peter M, MD;  Location: [facility] INVASIVE CV LAB;  Service: Cardiovascular;  Laterality: N/A;   LEFT HEART CATHETERIZATION WITH CORONARY ANGIOGRAM N/A 11/26/2013   Procedure: LEFT HEART CATHETERIZATION WITH CORONARY ANGIOGRAM;  Surgeon: Marykay Lex, MD;  Location: [facility] CATH LAB;  Service: Cardiovascular;  Laterality: N/A;   NOSE SURGERY     PACEMAKER IMPLANT N/A 07/02/2018   Procedure: PACEMAKER IMPLANT;  Surgeon: Regan Lemming, MD;  Location: [facility] INVASIVE CV LAB;  Service: Cardiovascular;  Laterality: N/A;   SHOULDER SURGERY  1997   TONSILLECTOMY AND ADENOIDECTOMY     VENTRICULOPERITONEAL SHUNT  02-23-12   INSERTION OF RIGHT FRONTAL VENTRICULOPERITONEAL SHUNT WITH A CODMAN HAKIM PROGRAMMABLE VALVE   Patient Active Problem List   Diagnosis Date Noted   Benign paroxysmal positional vertigo of right ear 03/03/2023   COVID-19 03/03/2023   Abrasion, forearm with infection, left, initial encounter 03/03/2023   Physical deconditioning 12/20/2022   Poor balance 12/20/2022    PVC's (premature ventricular contractions) 09/19/2022   Diplopia 09/19/2022   Parkinsonism 06/19/2022   Primary localized osteoarthrosis of shoulder region 05/10/2022   Recurrent dislocation of shoulder joint 05/10/2022   Regional enteritis (HCC) 05/10/2022   Vestibular hypofunction of left ear 05/10/2022   Sensorineural hearing loss 05/10/2022   Senile osteoporosis 02/03/2022   Pain in right hip 01/05/2022   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   Intertrigo 10/01/2021   HFrEF (heart failure with reduced ejection fraction) (HCC) 09/18/2021   Blepharitis of upper and lower eyelids of both eyes 09/18/2021   Tinea cruris 09/18/2021   Lewy body dementia (HCC) 09/16/2021   Closed fracture of right hip (HCC) 09/16/2021   S/P hip replacement, right  07/2021 09/16/2021   Presence of right artificial hip joint 08/06/2021   Vascular dementia (HCC) 04/29/2021   Multiple falls 04/29/2021   Cough 04/09/2021   Vitamin D deficiency 04/09/2021   Vitamin B1 deficiency 04/09/2021   Right arm pain 01/06/2021   Seizures (HCC)    Muscle cramping 12/30/2019   Dizziness 12/30/2019   Unsteady gait 12/30/2019   Pacemaker 11/27/2019   Fatigue 07/09/2019   SSS (sick sinus syndrome) (HCC) 07/02/2018   Bilateral shoulder pain 06/14/2018   Upper airway cough syndrome vs cough variant asthma 03/20/2018   Acute midline low back pain without sciatica 12/25/2017   Depression 10/17/2017   CAD (coronary artery disease), hx of NSTEMI 08/15/2017   Chest pain 07/17/2017   SOBOE (shortness of breath on exertion) 06/08/2017   Bilateral sensorineural hearing loss 06/01/2017   Nasal turbinate hypertrophy 06/01/2017   Dysphagia 05/31/2017   Osteoporosis 01/30/2017   History of cerebral hemorrhage 12/28/2016   Seborrheic dermatitis 07/25/2016   Cognitive communication deficit 06/28/2016   Hypertensive heart disease 06/28/2016   Vitamin B 12 deficiency 03/16/2016   Confusion 03/13/2016   VP (ventriculoperitoneal)  shunt status 02/12/2016   Orthostatic hypotension 01/05/2016   Benign prostatic hyperplasia with urinary obstruction 08/04/2014   Urinary urgency 08/04/2014   Gastroesophageal reflux disease 07/14/2014   Polymyalgia rheumatica (HCC) 05/28/2014   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/2014   SBE (subacute bacterial endocarditis) prophylaxis candidate 11/24/2013   Aphasia 07/17/2013   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 seizures, not intractable, without status epilepticus (HCC) 07/17/2013   Subdural hemorrhage (HCC) 07/17/2013   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HCC) 07/17/2013   Pre-diabetes 03/26/2013   Urinary frequency 10/31/2012   Nocturia 10/31/2012   History of cerebral aneurysm repair 10/18/2012   History of stroke without residual deficits 10/18/2012   Ocular rosacea 09/27/2012   Seizure disorder (HCC) 07/23/2012   Entropion 07/10/2012   Hyperopia with astigmatism 07/10/2012   Combined senile cataract 04/13/2012   Presbyopia 04/13/2012   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) 03/01/2012   Stroke (HCC) 03/01/2012   Cognitive safety issue 02/20/2012   Nonruptured cerebral aneurysm 11/23/2011   Obstructive sleep apnea syndrome 08/09/2011   Hyperlipidemia 02/03/2009   HTN (hypertension) 02/03/2009   History of Crohn's disease 04/17/2008   Allergic rhinitis, unspecified 07/05/1999   Cataract, right eye 07/05/1999    ONSET DATE: chronic   REFERRING DIAG: R26.89 (ICD-10-CM) - Poor balance R53.81 (ICD-10-CM) - Physical deconditioning  THERAPY DIAG:  Muscle weakness (generalized)  Other symptoms and signs involving cognitive functions following unspecified cerebrovascular disease  Unsteadiness on feet  Other abnormalities of gait and mobility  Abnormal posture  Rationale for Evaluation and Treatment: Rehabilitation  SUBJECTIVE:                                                                                                                                                                                              SUBJECTIVE STATEMENT: Feeling sleepy   Pt accompanied by: significant other  PERTINENT HISTORY: Aortic insufficiency, CRT-P pacemaker placement, cerebral aneurysm s/p clipping, cerebral hemorrhage and seizure disorder s/p VP shunt, CHF, CVA, second degree AV block Mobitz type 1 s/p PPM, HTN, HLD, hypertensive heart disease, IBS, lew body dementia, mitral regurgitation, NSVT, orthostatic hypotension, RA, SSS, subdural hematoma, hernia repair, cardiac cath  PAIN:  Are you having pain? Yes: NPRS scale: "B LEs and knees"/10 Pain location: "B LEs and knees"  Pain description: discomfort, some radicular pains down legs especially in the mornings  Aggravating factors: standing up, sitting unsupported Relieving factors: movement  PRECAUTIONS: Fall and ICD/Pacemaker, poor vision, STM impairments  RED FLAGS: None   WEIGHT BEARING RESTRICTIONS: No  FALLS: Has patient fallen in last 6 months? Yes. Number of falls 2 in the past 6 months, had additional falls >6 months ago; per spouse, falls a lot when bending to pick something up off the floor, fall with SDH and hip fracture in 2023 was due to slipping on wet grass outside   LIVING ENVIRONMENT: Lives with: lives with their family Lives in: House/apartment Stairs: Yes: Internal: 14 steps; one step to enter home  Has following equipment at home: Single point cane and "semi-hospital bed that adjusts the head, walker with 2 wheels, 4WW and WC, shower bench"   PLOF: Independent, Independent with basic ADLs, Requires assistive device for independence, and handheld shower head; per spouse "house is older and difficult to renovate, may try to convert bathroom to a shower stall  PATIENT GOALS: improve strength, balance, endurance   OBJECTIVE:   TODAY'S TREATMENT: 03/29/23 Activity Comments  NU-step resistance intervals x 8 min -2 min warmup -  30 sec heavy/light   Seated PWR moves 1x10 Heavy visual/verbal cues for sequence, most difficulty with twist requiring hand-over-hand guidance  Seated unilat row 1x10 15#, 20#, 25#  Retro-walking 4x 6 ft Cues for step length           *Adjusted new walker to pt's current height (lowered by one notch)  TODAY'S TREATMENT: 03/27/2023 Activity Comments  Seated ex -march in place 3 x 10 -LAQ 3 x 10 -ankle pumps 3 x 10 Added 2# weight for last set  Gait with RW: 85 ft x 2 with min assist Gait 30 ft x 4 reps, RW Gait 30 ft, RW Cues for foot clearance, heelstrike and to slow pace/stay close in BOS of walker.  Pt tends to speed up  Sit to stand, 2 x 5 reps, UE support   sitting PWR! Moves Up 5x  Rock 5x  Twist 10x with boomwhackers  Step 10x with boomwhackers Demo throughout; pt with difficulty matching PT's pace and decreased amplitude of movement with repetition.  Took breaks and restarted with PT cues, for larger amplitude movement              PATIENT EDUCATION: (SELF CARE) Education details: Reviewed with pt and daughter to keep doing seated PWR! Moves added last visit; RW lowered by one notch and discussed cues to "kick" R foot to help pt stay in BOS of RW and to slow pace with gait; daughter asks about U-step rollator; discussed potential benefits of trialing this and may trial next visit  Person educated: Patient and Spouse Education method: Explanation, Demonstration, Tactile cues, Verbal cues, and Handouts Education comprehension: verbalized understanding and returned demonstration   HOME EXERCISE PROGRAM: Access Code: [email or web address] URL: [email or web address] Date: 03/13/2023 Prepared by: [facility] - Outpatient  Rehab - [facility]  Program Notes walking program: practice walking with the walker for 5 minutes inside the house every hour   Exercises - Seated March with Resistance  - 1 x daily - 5 x weekly - 2 sets - 10 reps - Seated Long Arc Quad  - 1 x daily - 5 x weekly -  3 sets - 10 reps - Seated Ankle Dorsiflexion AROM  - 1 x daily - 7 x weekly - 3 sets - 10 reps - 3 sec hold - Seated Heel Raise  - 1 x daily - 5 x weekly - 2 sets - 10 reps - Cervical Extension AROM with Strap  - 1 x daily - 7 x weekly - 3 sets - 10 reps - Side to Side Weight Shift with Overhead Reach and Counter Support  - 1 x daily - 7 x weekly - 3 sets - 10 reps - Standing Gastroc Stretch at Counter  - 1 x daily - 7 x weekly - 3 sets - 30-60 sec hold   Added: sitting PWR! Moves handout  Use of boomwhackers/paper towel roll with twist and step 10x each    ----------------------------------------------------- Objective measures below taken at initial evaluation:  COGNITION: Overall cognitive status: History of cognitive impairments - at baseline       POSTURE: rounded shoulders, forward head, decreased lumbar lordosis, and increased thoracic kyphosis, flexed at hips     LOWER EXTREMITY MMT:    MMT Right Eval Left Eval  Hip flexion 3 3  Hip extension    Hip abduction 3 seated  3 seated  Hip adduction    Hip internal rotation    Hip external rotation    Knee flexion  Knee extension 4 4  Ankle dorsiflexion 3+ 3+  Ankle plantarflexion    Ankle inversion    Ankle eversion    (Blank rows = not tested)    TRANSFERS: Assistive device utilized: Single point cane  Sit to stand: SBA Stand to sit: SBA Chair to chair: SBA      GAIT: Gait pattern: step through pattern, decreased arm swing- Right, decreased arm swing- Left, decreased step length- Right, decreased step length- Left, decreased stance time- Right, decreased stance time- Left, decreased stride length, decreased ankle dorsiflexion- Right, decreased ankle dorsiflexion- Left, Right foot flat, Left foot flat, shuffling, trendelenburg, decreased trunk rotation, trunk flexed, and wide BOS Distance walked: in clinic distances  Assistive device utilized: Single point cane Level of assistance: SBA Comments: slow  and steady with SPC   FUNCTIONAL TESTS:  5 times sit to stand: 58 seconds use of UEs on chair  Timed up and go (TUG): 44 seconds, SPC  Gait speed 0.14 m/s with SPC   PATIENT SURVEYS:  FOTO next visit       GOALS: Goals reviewed with patient? Yes  SHORT TERM GOALS: Target date: 04/03/2023    Will be compliant with appropriate progressive HEP with no more than MinA from family  Baseline: Goal status: IN PROGRESS  2.  Will improve gait speed to at least 0.33m/s with LRAD  Baseline:  Goal status: IN PROGRESS  3.  Will complete TUG test in 30 seconds or better with LRAD  Baseline:  Goal status: IN PROGRESS  4.  Will complete 5xSTS test in 45 seconds or less with UEs  Baseline:  Goal status: IN PROGRESS  5.  Will tolerate walking and/or activity in PT for at least 10 minutes without needing a rest break  Baseline:  Goal status: IN PROGRESS    LONG TERM GOALS: Target date: 05/15/2023    MMT to improve by one grade in all weak groups  Baseline:  Goal status: IN PROGRESS  2.  Will be able to ambulate at least 32ft in (0.79m/s) with LRAD to show improved community access no rest breaks   Baseline:  Goal status: IN PROGRESS  3.  Will score at least 42 on the Berg to show reduced fall risk  Baseline:  Goal status: IN PROGRESS  4.  Will complete 5xSTS test in 20 seconds or less with UEs to show improved functional mobility  Baseline:  Goal status: IN PROGRESS  5.  Will be compliant with appropriate progressive long term exercise plan with no more than MinA from family to combat sedentary lifestyle and maintain functional gains from PT  Baseline:  Goal status: IN PROGRESS    ASSESSMENT:  CLINICAL IMPRESSION: Initiated activities with NU-step resistance intervals for general conditioning and to improve activity tolerance via resistance intervals w/ cues for maintaining cadence. Seated large amplitude activities to improve coordination and flexibility.   Difficulty with sequencing these movements especially those in crossing midline requiring hand-over-hand cues to maintain accuracy.  Did very well with seated upper body strengthening via compound movements which was performed for general conditioning and improved posture. Retro-walking attempted along counter for UE support with step-to pattern and short step length due to poor balance/motor control issues. Continued sessions to progress POC details and improve mobility/balance to reduce risk for falls OBJECTIVE IMPAIRMENTS: Abnormal gait, decreased activity tolerance, decreased balance, decreased cognition, decreased coordination, decreased knowledge of use of DME, decreased mobility, difficulty walking, decreased strength, decreased safety awareness, postural dysfunction, and obesity.  ACTIVITY LIMITATIONS: lifting, bending, standing, squatting, stairs, transfers, bed mobility, and locomotion level  PARTICIPATION LIMITATIONS: meal prep, cleaning, shopping, community activity, yard work, and church  PERSONAL FACTORS: Age, Fitness, Past/current experiences, Social background, and Time since onset of injury/illness/exacerbation are also affecting patient's functional outcome.   REHAB POTENTIAL: Good  CLINICAL DECISION MAKING: Evolving/moderate complexity  EVALUATION COMPLEXITY: Moderate  PLAN:  PT FREQUENCY: 2x/week  PT DURATION: 12 weeks  PLANNED INTERVENTIONS: Therapeutic exercises, Therapeutic activity, Neuromuscular re-education, Balance training, Gait training, Patient/Family education, Self Care, Stair training, DME instructions, Aquatic Therapy, Dry Needling, Cognitive remediation, Cryotherapy, Moist heat, Manual therapy, and Re-evaluation  PLAN FOR NEXT SESSION: Standing at locked/steady RW:  marching in place/kicks to see about improved foot clearance.  Trial of U-step Rollator to see if use of laser line helps with step length/foot clearance.  Continue to review sitting PWR! Moves  and progress with reps and amplitude of movement.   Utilize NuStep, Work on strength, balance, endurance as tolerated.    4:27 PM, 03/29/23 M. Shary Decamp, PT, DPT Physical Therapist- [place] Office Number: [phone]

## 2023-03-29 NOTE — Therapy (Unsigned)
OUTPATIENT SPEECH LANGUAGE PATHOLOGY [facility]INSON'S EVALUATION   Patient Name: Jeff Weaver MRN: [id number] DOB:[birth year], 77 y.o., male Today's Date: 03/29/2023  PCP: Cheryll Cockayne, MD REFERRING PROVIDER: Same as PCP  END OF SESSION:  End of Session - 03/29/23 1620     Visit Number 4    Number of Visits 25    Date for SLP Re-Evaluation 06/13/23    SLP Start Time 1620    SLP Stop Time  1700    SLP Time Calculation (min) 40 min    Activity Tolerance Patient tolerated treatment well              Past Medical History:  Diagnosis Date   Aortic insufficiency    Atypical chest pain    Cardiac resynchronization therapy pacemaker (CRT-P) in place    Cerebral aneurysm    a. s/p clipping at [facility] in 8/13 c/b short-term memory loss, cerebral hemorrhage and seizure disorder s/p VP shunt.   Cerebral hemorrhage (HCC)    Chronic combined systolic and diastolic CHF (congestive heart failure) (HCC)    CVA (cerebral infarction)    Diverticulosis    Enteritis (regional)    Dr Juanda Chance   First degree AV block    GI bleed    HTN (hypertension)    Hyperlipidemia    Hypertensive heart disease    IBS (irritable bowel syndrome)    Lewy body dementia (HCC)    Mild CAD    Cardiac cath 01/12/18 showed minimal nonobstructive CAD, normal LVEF, normal LVEDP.   Mild dilation of ascending aorta (HCC)    Mitral regurgitation    NSVT (nonsustained ventricular tachycardia) (HCC)    Orthostatic hypotension    Pacemaker    Pre-diabetes    PVC's (premature ventricular contractions)    Regional enteritis of large intestine (HCC) since 1978   Rheumatoid arthritis(714.0)    dxed in [employer] in 1980s   Second degree AV block, Mobitz type I    Seizures (HCC)    last April 26,2021   Sinus bradycardia    Sleep apnea    SSS (sick sinus syndrome) (HCC)    Subdural hematoma (HCC)    Past Surgical History:  Procedure Laterality Date   BIV PACEMAKER INSERTION CRT-P N/A 03/10/2021   Procedure: BIV PACEMAKER  INSERTION CRT-P UPGRADE;  Surgeon: Regan Lemming, MD;  Location: [facility] INVASIVE CV LAB;  Service: Cardiovascular;  Laterality: N/A;   CHOLECYSTECTOMY N/A 01/07/2016   Procedure: LAPAROSCOPIC CHOLECYSTECTOMY ;  Surgeon: Abigail Miyamoto, MD;  Location: [facility] OR;  Service: General;  Laterality: N/A;   cns shunt  02/23/12   COLONOSCOPY W/ POLYPECTOMY  1978   negative 2009, Dr Juanda Chance. Due 2014   CRANIOTOMY  02/02/12   Dr Kelby Aline, [facility]-clipping of aneurysm   CRANIOTOMY  02-02-12   left pterional craniotomy for clipping complex anterior communicating artery aneurysm    HERNIA REPAIR     LEFT HEART CATH AND CORONARY ANGIOGRAPHY N/A 01/12/2018   Procedure: LEFT HEART CATH AND CORONARY ANGIOGRAPHY;  Surgeon: Swaziland, Peter M, MD;  Location: [facility] INVASIVE CV LAB;  Service: Cardiovascular;  Laterality: N/A;   LEFT HEART CATHETERIZATION WITH CORONARY ANGIOGRAM N/A 11/26/2013   Procedure: LEFT HEART CATHETERIZATION WITH CORONARY ANGIOGRAM;  Surgeon: Marykay Lex, MD;  Location: [facility] CATH LAB;  Service: Cardiovascular;  Laterality: N/A;   NOSE SURGERY     PACEMAKER IMPLANT N/A 07/02/2018   Procedure: PACEMAKER IMPLANT;  Surgeon: Regan Lemming, MD;  Location: [facility] INVASIVE CV LAB;  Service:  Cardiovascular;  Laterality: N/A;   SHOULDER SURGERY  1997   TONSILLECTOMY AND ADENOIDECTOMY     VENTRICULOPERITONEAL SHUNT  02-23-12   INSERTION OF RIGHT FRONTAL VENTRICULOPERITONEAL SHUNT WITH A CODMAN HAKIM PROGRAMMABLE VALVE   Patient Active Problem List   Diagnosis Date Noted   Benign paroxysmal positional vertigo of right ear 03/03/2023   COVID-19 03/03/2023   Abrasion, forearm with infection, left, initial encounter 03/03/2023   Physical deconditioning 12/20/2022   Poor balance 12/20/2022   PVC's (premature ventricular contractions) 09/19/2022   Diplopia 09/19/2022   Parkinsonism 06/19/2022   Primary localized osteoarthrosis of shoulder region 05/10/2022   Recurrent dislocation of shoulder joint 05/10/2022    Regional enteritis (HCC) 05/10/2022   Vestibular hypofunction of left ear 05/10/2022   Sensorineural hearing loss 05/10/2022   Senile osteoporosis 02/03/2022   Pain in right hip 01/05/2022   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   Intertrigo 10/01/2021   HFrEF (heart failure with reduced ejection fraction) (HCC) 09/18/2021   Blepharitis of upper and lower eyelids of both eyes 09/18/2021   Tinea cruris 09/18/2021   Lewy body dementia (HCC) 09/16/2021   Closed fracture of right hip (HCC) 09/16/2021   S/P hip replacement, right  07/2021 09/16/2021   Presence of right artificial hip joint 08/06/2021   Vascular dementia (HCC) 04/29/2021   Multiple falls 04/29/2021   Cough 04/09/2021   Vitamin D deficiency 04/09/2021   Vitamin B1 deficiency 04/09/2021   Right arm pain 01/06/2021   Seizures (HCC)    Muscle cramping 12/30/2019   Dizziness 12/30/2019   Unsteady gait 12/30/2019   Pacemaker 11/27/2019   Fatigue 07/09/2019   SSS (sick sinus syndrome) (HCC) 07/02/2018   Bilateral shoulder pain 06/14/2018   Upper airway cough syndrome vs cough variant asthma 03/20/2018   Acute midline low back pain without sciatica 12/25/2017   Depression 10/17/2017   CAD (coronary artery disease), hx of NSTEMI 08/15/2017   Chest pain 07/17/2017   SOBOE (shortness of breath on exertion) 06/08/2017   Bilateral sensorineural hearing loss 06/01/2017   Nasal turbinate hypertrophy 06/01/2017   Dysphagia 05/31/2017   Osteoporosis 01/30/2017   History of cerebral hemorrhage 12/28/2016   Seborrheic dermatitis 07/25/2016   Cognitive communication deficit 06/28/2016   Hypertensive heart disease 06/28/2016   Vitamin B 12 deficiency 03/16/2016   Confusion 03/13/2016   VP (ventriculoperitoneal) shunt status 02/12/2016   Orthostatic hypotension 01/05/2016   Benign prostatic hyperplasia with urinary obstruction 08/04/2014   Urinary urgency 08/04/2014   Gastroesophageal reflux disease 07/14/2014   Polymyalgia rheumatica  (HCC) 05/28/2014   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/2014   SBE (subacute bacterial endocarditis) prophylaxis candidate 11/24/2013   Aphasia 07/17/2013   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 seizures, not intractable, without status epilepticus (HCC) 07/17/2013   Subdural hemorrhage (HCC) 07/17/2013   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HCC) 07/17/2013   Pre-diabetes 03/26/2013   Urinary frequency 10/31/2012   Nocturia 10/31/2012   History of cerebral aneurysm repair 10/18/2012   History of stroke without residual deficits 10/18/2012   Ocular rosacea 09/27/2012   Seizure disorder (HCC) 07/23/2012   Entropion 07/10/2012   Hyperopia with astigmatism 07/10/2012   Combined senile cataract 04/13/2012   Presbyopia 04/13/2012   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) 03/01/2012   Stroke (HCC) 03/01/2012   Cognitive safety issue 02/20/2012   Nonruptured cerebral aneurysm 11/23/2011   Obstructive sleep apnea syndrome 08/09/2011   Hyperlipidemia 02/03/2009   HTN (hypertension) 02/03/2009   History of Crohn's disease 04/17/2008  Allergic rhinitis, unspecified 07/05/1999   Cataract, right eye 07/05/1999    ONSET DATE: chronic disease- script dated 02/21/23  REFERRING DIAG: R41.841 (ICD-10-CM) - Cognitive communication deficit G31.83,F02.80 (ICD-10-CM) - Lewy body dementia, unspecified dementia severity, unspecified whether behavioral, psychotic, or mood disturbance or anxiety (HCC) I63.512 (ICD-10-CM) - 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  PAIN:  Are you having pain? Yes: NPRS scale:  6/10 Pain location: bil knees  FALLS: Has patient fallen in last 6 months?  See PT evaluation for details   PATIENT GOALS: Increase speech clarity, assistance with food clearance  OBJECTIVE:   MBS [facility] April 2024: Patient with mild oropharyngeal dysphagia, like secondary to age-related changes to the swallowing mechanism and generalized deconditioning. Dysphagia is primarily characterized by mistiming of the pharyngeal swallow and reduced supraglottic closure, resulting in penetration of thin liquids during the swallow with intermittent silent aspiration on progression of cord-level residual. Cued post-swallow throat clearing was effective at mitigating penetrants. PAS scores below. Bolus propulsion and UES distension is largely intact. Based on today's examination, recommend patient continue regular solids/thin liquids, pills one at a time, with adherence to aspiration precautions and swallowing strategies including alternation of liquids and solids, SINGLE sips at a time, and regular post-swallow throat clearing after the swallow. Provided risks and adverse outcomes associated with dysphagia and aspiration (i.e possible life-threatening pneumonia) and rationale for adherence to management recommendations. Did provide signs of worsening dysphagia and return to clinic criteria. Will re-evaluate in 6 months, certainly sooner if indicated. Patient and family verbalized understanding of all exam results and recommendations.  Recommendations: Diet: Regular Aspiration precautions:  sit upright during all PO (as close to 90 degrees as possible) alternate bites/sips cough/clear throat regularly during the meal SINGLE sips  Medications: one at a time   PATIENT REPORTED OUTCOME MEASURES (PROM): Communication Effectiveness Survey: forgot on 03/29/23; will bring next session  TODAY'S TREATMENT:                                                                                                                                          DATE:  03/29/23: Pt completed conversation in the ST room with [facility] loudness; rare min cues for loudness with monologues, but pt successfully incr'd loudness each time. Asher Muir will bring CES next session. Ozro recited his everyday sentences with initial cues for loudness. He indicated again that he did not want to shout, and SLP again reminded him that everyday sentences should be shouting, but this will not carry to conversation. Educated again about rationale for everyday sentences (muscle memory for conversation). Because pt's memory  skills are reduced, affecting pt's ability to recall that he needs to speak louder, SLP also educated wife and pt about effective communication techniques. According to Asher Muir she/they have already worked to find and implement some of these techniques already as pt's various challenges with communication began in 2013.  03/27/23: MBS scheduled for 04/07/23. SLP cued pt if he was having difficulty with drooling to swallow first and then wipe excess saliva. SLP provided a CES today to [place] and asked her to complete and return next session. SLP worked with pt's loudness today, first in everyday sentences pt with average 83 dB; in sentence completion tasks averaged 74dB with min A <10% for increasing loudness. In simiarity/difference task, pt's average loudness was 72dB however pt's responses did not match stimulus 25% of the time and SLP had to provide cues for pt to correctly answer, sometimes using max cues. Suspect fatigue played a role with this, as ST was pt's last therapy today.   03/22/23: Pt needs CES next session.  SLP introduced pt and wife to everyday sentences. Pt thought of 5 and wife generated 4. SLP instructed pt to recite these twice a day in a loud, strong voice, and explained rationale for pt and wife. Pt questioned rationale and SLP drew a diagram with WNL volume and pt's conversational volume and reiterated when he talks loudly it  brings his volume into/closer to WNL range. Von spoke in a louder volume for 20 seconds of a 35-second monologue about a general in the [employer] after this, and answered affirmatively when asked if he was trying to use a louder voice. SLP encouraged him that this was WNL volume for a portion of his last conversational turn.  03/15/23 (eval): n/a  PATIENT EDUCATION: Education details: See "today's treatment" for details Person educated: Patient and Spouse Education method: Explanation, Demonstration, and Verbal cues Education comprehension: verbalized understanding and needs further education  HOME EXERCISE PROGRAM: TBD   GOALS: Goals reviewed with patient? Yes (in general)  SHORT TERM GOALS: Target date: 04/14/23  Pt will complete MBS to assess current swallowing status and assess safe swallow strategies Baseline: Goal status: INITIAL  2.  Pt will maintain average mid-upper 80s for loud /a/ in 3 sessions Baseline:  Goal status: INITIAL  3.  Pt will use speech volume average upper 60s dB for 5 minutes simple conversation in two sessions Baseline:  Goal status: INITIAL  4.  pt will generate abdominal breathing at rest (seated position) 80% of the time over 2 sessions Baseline:  Goal status: INITIAL   LONG TERM GOALS: Target date: 05/12/23  Pt will improve PROM measure scores in the last 1-2 sessions Baseline:  Goal status: INITIAL  2.  Pt will maintain average upper 80s for loud /a/ in 3 sessions Baseline:  Goal status: INITIAL  3.  Pt will use speech volume average low 70s dB for 8 minutes simple-mod complex  conversation with occasional verbal cues in two sessions Baseline:  Goal status: INITIAL  4.   Pt will generate abdominal breathing 60% of the time in 8 minutes simple-mod complex conversation over 2 sessions Baseline:  Goal status: INITIAL   ASSESSMENT:  CLINICAL IMPRESSION: Patient is a 77 y.o. M who was seen today for treatment of speech intelligibility in  light of Parkinsonism. Goals for swallowing TBD following MBS on 04/07/23. See "today's treatment" for more details. "They are always telling me to speak up," pt stated to SLP on eval date. Pt's OT to target compensations for  cognition.  OBJECTIVE IMPAIRMENTS: Objective impairments include memory, dysarthria, and dysphagia. These impairments are limiting patient from ADLs/IADLs, effectively communicating at home and in community, and safety when swallowing.Factors affecting potential to achieve goals and functional outcome are ability to learn/carryover information and co-morbidities.. Patient will benefit from skilled SLP services to address above impairments and improve overall function.  REHAB POTENTIAL: Good  PLAN:  SLP FREQUENCY: 2x/week  SLP DURATION: 8 weeks  PLANNED INTERVENTIONS: Aspiration precaution training, Pharyngeal strengthening exercises, Diet toleration management , Environmental controls, Trials of upgraded texture/liquids, Internal/external aids, Oral motor exercises, Functional tasks, SLP instruction and feedback, Compensatory strategies, and Patient/family education    [facility], CCC-SLP 03/29/2023, 4:38 PM

## 2023-03-29 NOTE — Therapy (Signed)
OUTPATIENT OCCUPATIONAL THERAPY NEURO  Treatment Note  Patient Name: Jeff Weaver MRN: [id number] DOB:10-21-1945, 77 y.o., male Today's Date: 03/29/2023  PCP: Pincus Sanes, MD REFERRING PROVIDER: Pincus Sanes, MD  END OF SESSION:  OT End of Session - 03/29/23 1454     Visit Number 4    Number of Visits 17    Date for OT Re-Evaluation 05/12/23    Authorization Type Medicare A&B    OT Start Time 1450    OT Stop Time 1530    OT Time Calculation (min) 40 min                Past Medical History:  Diagnosis Date   Aortic insufficiency    Atypical chest pain    Cardiac resynchronization therapy pacemaker (CRT-P) in place    Cerebral aneurysm    a. s/p clipping at [facility] in 8/13 c/b short-term memory loss, cerebral hemorrhage and seizure disorder s/p VP shunt.   Cerebral hemorrhage (HCC)    Chronic combined systolic and diastolic CHF (congestive heart failure) (HCC)    CVA (cerebral infarction)    Diverticulosis    Enteritis (regional)    Dr Juanda Chance   First degree AV block    GI bleed    HTN (hypertension)    Hyperlipidemia    Hypertensive heart disease    IBS (irritable bowel syndrome)    Lewy body dementia (HCC)    Mild CAD    Cardiac cath 01/12/18 showed minimal nonobstructive CAD, normal LVEF, normal LVEDP.   Mild dilation of ascending aorta (HCC)    Mitral regurgitation    NSVT (nonsustained ventricular tachycardia) (HCC)    Orthostatic hypotension    Pacemaker    Pre-diabetes    PVC's (premature ventricular contractions)    Regional enteritis of large intestine (HCC) since 1978   Rheumatoid arthritis(714.0)    dxed in [employer] in 1980s   Second degree AV block, Mobitz type I    Seizures (HCC)    last April 26,2021   Sinus bradycardia    Sleep apnea    SSS (sick sinus syndrome) (HCC)    Subdural hematoma (HCC)    Past Surgical History:  Procedure Laterality Date   BIV PACEMAKER INSERTION CRT-P N/A 03/10/2021   Procedure: BIV PACEMAKER INSERTION CRT-P  UPGRADE;  Surgeon: Regan Lemming, MD;  Location: [facility] INVASIVE CV LAB;  Service: Cardiovascular;  Laterality: N/A;   CHOLECYSTECTOMY N/A 01/07/2016   Procedure: LAPAROSCOPIC CHOLECYSTECTOMY ;  Surgeon: Abigail Miyamoto, MD;  Location: [facility] OR;  Service: General;  Laterality: N/A;   cns shunt  02/23/12   COLONOSCOPY W/ POLYPECTOMY  1978   negative 2009, Dr Juanda Chance. Due 2014   CRANIOTOMY  02/02/12   Dr Kelby Aline, [facility]-clipping of aneurysm   CRANIOTOMY  02-02-12   left pterional craniotomy for clipping complex anterior communicating artery aneurysm    HERNIA REPAIR     LEFT HEART CATH AND CORONARY ANGIOGRAPHY N/A 01/12/2018   Procedure: LEFT HEART CATH AND CORONARY ANGIOGRAPHY;  Surgeon: Swazil[place], Peter M, MD;  Location: [facility] INVASIVE CV LAB;  Service: Cardiovascular;  Laterality: N/A;   LEFT HEART CATHETERIZATION WITH CORONARY ANGIOGRAM N/A 11/26/2013   Procedure: LEFT HEART CATHETERIZATION WITH CORONARY ANGIOGRAM;  Surgeon: Marykay Lex, MD;  Location: [facility] CATH LAB;  Service: Cardiovascular;  Laterality: N/A;   NOSE SURGERY     PACEMAKER IMPLANT N/A 07/02/2018   Procedure: PACEMAKER IMPLANT;  Surgeon: Regan Lemming, MD;  Location: [facility] INVASIVE CV LAB;  Service: Cardiovascular;  Laterality: N/A;   SHOULDER SURGERY  1997   TONSILLECTOMY AND ADENOIDECTOMY     VENTRICULOPERITONEAL SHUNT  02-23-12   INSERTION OF RIGHT FRONTAL VENTRICULOPERITONEAL SHUNT WITH A CODMAN HAKIM PROGRAMMABLE VALVE   Patient Active Problem List   Diagnosis Date Noted   Benign paroxysmal positional vertigo of right ear 03/03/2023   COVID-19 03/03/2023   Abrasion, forearm with infection, left, initial encounter 03/03/2023   Physical deconditioning 12/20/2022   Poor balance 12/20/2022   PVC's (premature ventricular contractions) 09/19/2022   Diplopia 09/19/2022   Parkinsonism 06/19/2022   Primary localized osteoarthrosis of shoulder region 05/10/2022   Recurrent dislocation of shoulder joint 05/10/2022   Regional  enteritis (HCC) 05/10/2022   Vestibular hypofunction of left ear 05/10/2022   Sensorineural hearing loss 05/10/2022   Senile osteoporosis 02/03/2022   Pain in right hip 01/05/2022   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   Intertrigo 10/01/2021   HFrEF (heart failure with reduced ejection fraction) (HCC) 09/18/2021   Blepharitis of upper and lower eyelids of both eyes 09/18/2021   Tinea cruris 09/18/2021   Lewy body dementia (HCC) 09/16/2021   Closed fracture of right hip (HCC) 09/16/2021   S/P hip replacement, right  07/2021 09/16/2021   Presence of right artificial hip joint 08/06/2021   Vascular dementia (HCC) 04/29/2021   Multiple falls 04/29/2021   Cough 04/09/2021   Vitamin D deficiency 04/09/2021   Vitamin B1 deficiency 04/09/2021   Right arm pain 01/06/2021   Seizures (HCC)    Muscle cramping 12/30/2019   Dizziness 12/30/2019   Unsteady gait 12/30/2019   Pacemaker 11/27/2019   Fatigue 07/09/2019   SSS (sick sinus syndrome) (HCC) 07/02/2018   Bilateral shoulder pain 06/14/2018   Upper airway cough syndrome vs cough variant asthma 03/20/2018   Acute midline low back pain without sciatica 12/25/2017   Depression 10/17/2017   CAD (coronary artery disease), hx of NSTEMI 08/15/2017   Chest pain 07/17/2017   SOBOE (shortness of breath on exertion) 06/08/2017   Bilateral sensorineural hearing loss 06/01/2017   Nasal turbinate hypertrophy 06/01/2017   Dysphagia 05/31/2017   Osteoporosis 01/30/2017   History of cerebral hemorrhage 12/28/2016   Seborrheic dermatitis 07/25/2016   Cognitive communication deficit 06/28/2016   Hypertensive heart disease 06/28/2016   Vitamin B 12 deficiency 03/16/2016   Confusion 03/13/2016   VP (ventriculoperitoneal) shunt status 02/12/2016   Orthostatic hypotension 01/05/2016   Benign prostatic hyperplasia with urinary obstruction 08/04/2014   Urinary urgency 08/04/2014   Gastroesophageal reflux disease 07/14/2014   Polymyalgia rheumatica (HCC)  05/28/2014   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/2014   SBE (subacute bacterial endocarditis) prophylaxis candidate 11/24/2013   Aphasia 07/17/2013   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 seizures, not intractable, without status epilepticus (HCC) 07/17/2013   Subdural hemorrhage (HCC) 07/17/2013   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HCC) 07/17/2013   Pre-diabetes 03/26/2013   Urinary frequency 10/31/2012   Nocturia 10/31/2012   History of cerebral aneurysm repair 10/18/2012   History of stroke without residual deficits 10/18/2012   Ocular rosacea 09/27/2012   Seizure disorder (HCC) 07/23/2012   Entropion 07/10/2012   Hyperopia with astigmatism 07/10/2012   Combined senile cataract 04/13/2012   Presbyopia 04/13/2012   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) 03/01/2012   Stroke (HCC) 03/01/2012   Cognitive safety issue 02/20/2012   Nonruptured cerebral aneurysm 11/23/2011   Obstructive sleep apnea syndrome 08/09/2011   Hyperlipidemia 02/03/2009   HTN (hypertension) 02/03/2009   History of Crohn's disease 04/17/2008  Allergic rhinitis, unspecified 07/05/1999   Cataract, right eye 07/05/1999    ONSET DATE: referral date 02/21/23  REFERRING DIAG: R41.841 (ICD-10-CM) - Cognitive communication deficit G31.83,F02.80 (ICD-10-CM) - Lewy body dementia, unspecified dementia severity, unspecified whether behavioral, psychotic, or mood disturbance or anxiety (HCC) I63.512 (ICD-10-CM) - 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 Pt accompanied by: self and family member (spouse)  PERTINENT HISTORY: Aortic insufficiency, CRT-P pacemaker placement, cerebral aneurysm s/p clipping, cerebral hemorrhage and seizure disorder s/p VP shunt, CHF, CVA, second degree AV block Mobitz type 1 s/p PPM, HTN, HLD, hypertensive heart disease, IBS, lew body dementia, mitral regurgitation, NSVT, orthostatic hypotension, RA, SSS, subdural hematoma, hernia repair, cardiac cath  PRECAUTIONS: Fall, ICD/Pacemaker, and Other: poor vision, STM impairments  WEIGHT BEARING RESTRICTIONS: No  PAIN:  Are you having pain? Yes: NPRS scale: 7/10 Pain location: B knees R > L Pain description: aching, discomfort Aggravating factors: prolonged sitting Relieving factors: movement  FALLS: Has patient fallen in last 6 months? Yes. Number of falls 2-3, getting in/out of bed or when turning to pick something up   LIVING ENVIRONMENT: Lives with: lives with their family (daughter comes and goes, will spend a few days per week in the home with them). Lives in: House/apartment Stairs: Yes: Internal: 14 steps; and External: 1 step to enter home  Has following equipment at home: Dan Humphreys - 2 wheeled, Environmental consultant - 4 wheeled, Tour manager, and Grab bars  PLOF: Independent and Independent with basic ADLs  PATIENT GOALS: improve endurance and engagement in ADLs  OBJECTIVE:   HAND DOMINANCE: Right  ADLs: Transfers/ambulation related to ADLs: Mod I to supervision with RW Eating: spouse typically cuts foods for him, pt will lean towards food instead of bringing food to mouth, spoon is preferred utensil Grooming: increased time UB Dressing: increased time, is ableto manage buttons LB Dressing: wears disposable underwear and pull on pants for increased ease Toileting: Mod I, does have a grab bar in front that he will pull up on; difficulty with hygiene post BM Bathing: spouse assists with bathing, requires encouragement to  bathe Tub Shower transfers: spouse assists with transfers Equipment: Transfer tub bench and Grab bars  IADLs: Medication management: spouse fills pill box and will remind him to take  MOBILITY STATUS: Needs Assist: Mod I to Supervision with RW and Hx of falls; required min assist to stand from chair without arm rests  POSTURE COMMENTS:  rounded shoulders, forward head, decreased lumbar lordosis, increased thoracic kyphosis, and right head tilt  ACTIVITY TOLERANCE: Activity tolerance: diminished  UPPER EXTREMITY ROM:    Active ROM Right eval Left eval  Shoulder flexion 90 110  Shoulder abduction    Shoulder adduction    Shoulder extension    Shoulder internal rotation 80% 70%  Shoulder external rotation 80% 80%  Elbow flexion    Elbow extension    Wrist flexion    Wrist extension    Wrist ulnar deviation    Wrist radial deviation    Wrist pronation    Wrist supination    (Blank rows = not tested)  HAND FUNCTION: 03/29/23 Grip strength: Right:  45, 40, 36 lbs; Left: 41, 36, 33 lbs  COORDINATION: 03/22/23 9 Hole Peg test: Right: 53.19 sec; Left: 1:17.28 sec Box and Blocks:  Right 26 blocks, Left 23 blocks  COGNITION: Overall cognitive status: History of cognitive impairments - at baseline Brief Interview for Mental Status:   Memory:  Word Repetition:   "Sock"  "Blue"  "Bed"   Orientation Level:    Year:   2024  Month: Sept  Day of Week: Weds  Memory:  Recall:     Unable to recall "Sock"  with cue   Able to recall  "Blue"  without cue   Able to recall  "Bed"   with cue     No Recall   VISION: Subjective report: diplopia.  Recently went to ophthalmologist who applied a Fresnel prism 8 base in on the right lens of his readers. Baseline vision: Wears glasses for reading only  VISION ASSESSMENT: Impaired To be further assessed in functional context   TODAY'S TREATMENT:                                                                         DATE: 03/29/23 Grip strength: Assessed grip strength (see above for measurements).  Pt reports difficulty with opening containers and sustained grasp with clothing management.  OT educated on adaptive equipment to aid in opening containers.  Pt trialing grip pad as well as jar opener for increased grasp when opening tight jar.  Pt to take grip pad home to continue to use, wife taking note of adaptive jar opener as option to purchase as needed. Theraband: engaged in BUE strengthening with red theraband with OT providing mod multimodal cues for sequencing and proper technique.  Pt completing 1 set of horizontal abduction with cues for symmetry and 1 set of 10 shoulder diagonals bilaterally.  OT providing demonstration and cues for "thumb up" position to increase ROM.   03/27/23 Remote control: discussed adaptive remotes with large buttons, with and without numbers.  Discussed calling [employer] to see if they have an easier model to ensure communication with current TV system. Adaptive techniques for LB dressing: attempted simulated LB dressing with use of gait belt, however pt with difficulty due to impaired cognition with use of belt to simulate pants.  Pt demonstrating good sit > stand with and without use of BUE to push up from arm chair.  Transitioned to OT providing demonstration of various sitting positions and pt attempting figure 4 position as needed to thread pant legs.  OT providing pt and family with additional recommendations and modifications to attempt at home, to include modified figure 4 position at EOB, use of step stool, alternating UE support on RW when standing to pull pants over hips in back.  Provided with handouts from OT toolkit for family to attempt with pt at home.    03/22/23 Discussed sustained attention during task as pt interrupting tasks to tell stories and/or further conversation which may be impacting his speed as well as his attention to task.  Reiterated recommendation for  decreasing distractions as able to allow for increased focus on tasks. Coordination 9 Hole Peg test: Right: 53.19 sec; Left: 1:17.28 sec Box and Blocks:  Right 26 blocks, Left 23 blocks  Vision/attention: engaged in trail making, completed in 2:42 stopping ~3x to process and scan for next number.  Pt with one error going from 22 >19> 24.  Pt also not touching 30% of numbers, due to diplopia and missing numbers to the Left of a portion of numbers.   Discussing dressing routine, setup. Pt dressing in the bathroom after bathing or after having an accident.  OT providing demonstrating and education on modified positioning and techniques to increase success with LB dressing.  Discussed use of reacher, figure 4 position, and/or use of step stool to rest foot on during LB dressing.   PATIENT EDUCATION: Education details: ongoing condition specific education Person educated: Patient, Spouse, and Child(ren) Education method: Explanation Education comprehension: verbalized understanding and needs further education  HOME EXERCISE PROGRAM: Access Code: [email or web address] URL: [email or web address] Date: 03/29/2023 Prepared by: [facility] - Outpatient  Rehab - [facility]  Exercises - Seated Shoulder Horizontal Abduction with Resistance  - 2 x daily - 10 reps - 3-5 sec hold - Seated Shoulder Diagonal with Resistance  - 2 x daily - 10 reps - 3-5 sec hold   GOALS: Goals reviewed with patient? Yes  SHORT TERM GOALS: Target date: 04/14/23  Pt and caregiver will be independent with gentle ROM HEP for improved functional use of BUE. Baseline: Goal status: IN PROGRESS  2.  Pt will verbalize understanding of task modifications and/or potential AE needs to increase ease, safety, and independence w/ ADLs Baseline:  Goal status: IN PROGRESS  3.  Pt will verbalize understanding of use of new glasses with Fresnel lenses for increased visual perception as needed for ADLs and IADLs. Baseline:   Goal status: IN PROGRESS   LONG TERM GOALS: Target date: 05/12/23  Pt will demonstrate and/or report ability to complete LB dressing to include footwear at Mod I level utilizing adaptive techniques and/or AE PRN. Baseline:  Goal status: IN PROGRESS  2.  Pt will be able to complete toileting hygiene at Mod I level with adaptive techniques and/or AE PRN. Baseline:  Goal status: IN PROGRESS  3.  Pt will demonstrate improved internal rotation as needed for clothing management and hygiene post toileting. Baseline:  Goal status: IN PROGRESS  4.  Pt will demonstrate improved sustained attention to task with ability to complete table top task for 10 mins with 1 or fewer cues for attention to task. Baseline:  Goal status: IN PROGRESS  ASSESSMENT:  CLINICAL IMPRESSION: Pt continues to require increased time for sequencing and following of directions due to verbosity and requiring increased time for processing.  Pt demonstrating good technique with horizontal abduction/chest press and R shoulder diagonals, however difficulty with L shoulder diagonals benefiting from hand over hand to facilitate increased technique.   PERFORMANCE DEFICITS: in functional skills including ADLs, IADLs, ROM, pain, flexibility, Gross motor control, balance, body mechanics, endurance, and decreased knowledge of use of DME, cognitive skills including attention, memory, problem solving, and safety awareness, and psychosocial skills including coping strategies, environmental adaptation, and habits.     PLAN:  OT FREQUENCY: 2x/week  OT DURATION: 8 weeks  PLANNED INTERVENTIONS: self care/ADL training, therapeutic exercise, therapeutic activity, neuromuscular re-education, passive range of motion, balance training, functional mobility training, ultrasound, compression bandaging, moist heat, cryotherapy, patient/family education, cognitive remediation/compensation, visual/perceptual remediation/compensation, psychosocial  skills training, energy conservation, coping strategies training, and DME and/or AE instructions  RECOMMENDED OTHER SERVICES: NA  CONSULTED AND AGREED WITH PLAN OF CARE: Patient and family member/caregiver  PLAN FOR NEXT SESSION: adaptive techniques  for LB dressing, sit > stand, grip and UB strengthening   Terea Neubauer, OTR/L 03/29/2023, 2:54 PM

## 2023-03-31 ENCOUNTER — Ambulatory Visit: Payer: Medicare Other | Admitting: Physician Assistant

## 2023-04-03 ENCOUNTER — Ambulatory Visit: Payer: Medicare Other

## 2023-04-03 ENCOUNTER — Ambulatory Visit: Payer: Medicare Other | Admitting: Occupational Therapy

## 2023-04-03 DIAGNOSIS — R2689 Other abnormalities of gait and mobility: Secondary | ICD-10-CM | POA: Diagnosis not present

## 2023-04-03 DIAGNOSIS — R41840 Attention and concentration deficit: Secondary | ICD-10-CM

## 2023-04-03 DIAGNOSIS — R2681 Unsteadiness on feet: Secondary | ICD-10-CM

## 2023-04-03 DIAGNOSIS — R1310 Dysphagia, unspecified: Secondary | ICD-10-CM

## 2023-04-03 DIAGNOSIS — I69918 Other symptoms and signs involving cognitive functions following unspecified cerebrovascular disease: Secondary | ICD-10-CM

## 2023-04-03 DIAGNOSIS — M6281 Muscle weakness (generalized): Secondary | ICD-10-CM

## 2023-04-03 DIAGNOSIS — R471 Dysarthria and anarthria: Secondary | ICD-10-CM

## 2023-04-03 DIAGNOSIS — R293 Abnormal posture: Secondary | ICD-10-CM | POA: Diagnosis not present

## 2023-04-03 NOTE — Therapy (Signed)
OUTPATIENT SPEECH LANGUAGE PATHOLOGY TREATMENT   Patient Name: Jeff Weaver MRN: [id number] DOB:04/15/1946, 77 y.o., male Today's Date: 04/03/2023  PCP: Cheryll Cockayne, MD REFERRING PROVIDER: Same as PCP  END OF SESSION:  End of Session - 04/03/23 1415     Visit Number 5    Number of Visits 25    Date for SLP Re-Evaluation 06/13/23    SLP Start Time 1407    SLP Stop Time  1445    SLP Time Calculation (min) 38 min    Activity Tolerance Patient tolerated treatment well              Past Medical History:  Diagnosis Date   Aortic insufficiency    Atypical chest pain    Cardiac resynchronization therapy pacemaker (CRT-P) in place    Cerebral aneurysm    a. s/p clipping at [facility] in 8/13 c/b short-term memory loss, cerebral hemorrhage and seizure disorder s/p VP shunt.   Cerebral hemorrhage (HCC)    Chronic combined systolic and diastolic CHF (congestive heart failure) (HCC)    CVA (cerebral infarction)    Diverticulosis    Enteritis (regional)    Dr Juanda Chance   First degree AV block    GI bleed    HTN (hypertension)    Hyperlipidemia    Hypertensive heart disease    IBS (irritable bowel syndrome)    Lewy body dementia (HCC)    Mild CAD    Cardiac cath 01/12/18 showed minimal nonobstructive CAD, normal LVEF, normal LVEDP.   Mild dilation of ascending aorta (HCC)    Mitral regurgitation    NSVT (nonsustained ventricular tachycardia) (HCC)    Orthostatic hypotension    Pacemaker    Pre-diabetes    PVC's (premature ventricular contractions)    Regional enteritis of large intestine (HCC) since 1978   Rheumatoid arthritis(714.0)    dxed in [employer] in 1980s   Second degree AV block, Mobitz type I    Seizures (HCC)    last April 26,2021   Sinus bradycardia    Sleep apnea    SSS (sick sinus syndrome) (HCC)    Subdural hematoma (HCC)    Past Surgical History:  Procedure Laterality Date   BIV PACEMAKER INSERTION CRT-P N/A 03/10/2021   Procedure: BIV PACEMAKER INSERTION  CRT-P UPGRADE;  Surgeon: Regan Lemming, MD;  Location: [facility] INVASIVE CV LAB;  Service: Cardiovascular;  Laterality: N/A;   CHOLECYSTECTOMY N/A 01/07/2016   Procedure: LAPAROSCOPIC CHOLECYSTECTOMY ;  Surgeon: Abigail Miyamoto, MD;  Location: [facility] OR;  Service: General;  Laterality: N/A;   cns shunt  02/23/12   COLONOSCOPY W/ POLYPECTOMY  1978   negative 2009, Dr Juanda Chance. Due 2014   CRANIOTOMY  02/02/12   Dr Kelby Aline, [facility]-clipping of aneurysm   CRANIOTOMY  02-02-12   left pterional craniotomy for clipping complex anterior communicating artery aneurysm    HERNIA REPAIR     LEFT HEART CATH AND CORONARY ANGIOGRAPHY N/A 01/12/2018   Procedure: LEFT HEART CATH AND CORONARY ANGIOGRAPHY;  Surgeon: Swaziland, Peter M, MD;  Location: [facility] INVASIVE CV LAB;  Service: Cardiovascular;  Laterality: N/A;   LEFT HEART CATHETERIZATION WITH CORONARY ANGIOGRAM N/A 11/26/2013   Procedure: LEFT HEART CATHETERIZATION WITH CORONARY ANGIOGRAM;  Surgeon: Marykay Lex, MD;  Location: [facility] CATH LAB;  Service: Cardiovascular;  Laterality: N/A;   NOSE SURGERY     PACEMAKER IMPLANT N/A 07/02/2018   Procedure: PACEMAKER IMPLANT;  Surgeon: Regan Lemming, MD;  Location: [facility] INVASIVE CV LAB;  Service: Cardiovascular;  Laterality: N/A;   SHOULDER SURGERY  1997   TONSILLECTOMY AND ADENOIDECTOMY     VENTRICULOPERITONEAL SHUNT  02-23-12   INSERTION OF RIGHT FRONTAL VENTRICULOPERITONEAL SHUNT WITH A CODMAN HAKIM PROGRAMMABLE VALVE   Patient Active Problem List   Diagnosis Date Noted   Benign paroxysmal positional vertigo of right ear 03/03/2023   COVID-19 03/03/2023   Abrasion, forearm with infection, left, initial encounter 03/03/2023   Physical deconditioning 12/20/2022   Poor balance 12/20/2022   PVC's (premature ventricular contractions) 09/19/2022   Diplopia 09/19/2022   Parkinsonism 06/19/2022   Primary localized osteoarthrosis of shoulder region 05/10/2022   Recurrent dislocation of shoulder joint 05/10/2022   Regional  enteritis (HCC) 05/10/2022   Vestibular hypofunction of left ear 05/10/2022   Sensorineural hearing loss 05/10/2022   Senile osteoporosis 02/03/2022   Pain in right hip 01/05/2022   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   Intertrigo 10/01/2021   HFrEF (heart failure with reduced ejection fraction) (HCC) 09/18/2021   Blepharitis of upper and lower eyelids of both eyes 09/18/2021   Tinea cruris 09/18/2021   Lewy body dementia (HCC) 09/16/2021   Closed fracture of right hip (HCC) 09/16/2021   S/P hip replacement, right  07/2021 09/16/2021   Presence of right artificial hip joint 08/06/2021   Vascular dementia (HCC) 04/29/2021   Multiple falls 04/29/2021   Cough 04/09/2021   Vitamin D deficiency 04/09/2021   Vitamin B1 deficiency 04/09/2021   Right arm pain 01/06/2021   Seizures (HCC)    Muscle cramping 12/30/2019   Dizziness 12/30/2019   Unsteady gait 12/30/2019   Pacemaker 11/27/2019   Fatigue 07/09/2019   SSS (sick sinus syndrome) (HCC) 07/02/2018   Bilateral shoulder pain 06/14/2018   Upper airway cough syndrome vs cough variant asthma 03/20/2018   Acute midline low back pain without sciatica 12/25/2017   Depression 10/17/2017   CAD (coronary artery disease), hx of NSTEMI 08/15/2017   Chest pain 07/17/2017   SOBOE (shortness of breath on exertion) 06/08/2017   Bilateral sensorineural hearing loss 06/01/2017   Nasal turbinate hypertrophy 06/01/2017   Dysphagia 05/31/2017   Osteoporosis 01/30/2017   History of cerebral hemorrhage 12/28/2016   Seborrheic dermatitis 07/25/2016   Cognitive communication deficit 06/28/2016   Hypertensive heart disease 06/28/2016   Vitamin B 12 deficiency 03/16/2016   Confusion 03/13/2016   VP (ventriculoperitoneal) shunt status 02/12/2016   Orthostatic hypotension 01/05/2016   Benign prostatic hyperplasia with urinary obstruction 08/04/2014   Urinary urgency 08/04/2014   Gastroesophageal reflux disease 07/14/2014   Polymyalgia rheumatica (HCC)  05/28/2014   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/2014   SBE (subacute bacterial endocarditis) prophylaxis candidate 11/24/2013   Aphasia 07/17/2013   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 seizures, not intractable, without status epilepticus (HCC) 07/17/2013   Subdural hemorrhage (HCC) 07/17/2013   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HCC) 07/17/2013   Pre-diabetes 03/26/2013   Urinary frequency 10/31/2012   Nocturia 10/31/2012   History of cerebral aneurysm repair 10/18/2012   History of stroke without residual deficits 10/18/2012   Ocular rosacea 09/27/2012   Seizure disorder (HCC) 07/23/2012   Entropion 07/10/2012   Hyperopia with astigmatism 07/10/2012   Combined senile cataract 04/13/2012   Presbyopia 04/13/2012   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) 03/01/2012   Stroke (HCC) 03/01/2012   Cognitive safety issue 02/20/2012   Nonruptured cerebral aneurysm 11/23/2011   Obstructive sleep apnea syndrome 08/09/2011   Hyperlipidemia 02/03/2009   HTN (hypertension) 02/03/2009   History of Crohn's disease 04/17/2008   Allergic  rhinitis, unspecified 07/05/1999   Cataract, right eye 07/05/1999    ONSET DATE: chronic disease- script dated 02/21/23  REFERRING DIAG: R41.841 (ICD-10-CM) - Cognitive communication deficit G31.83,F02.80 (ICD-10-CM) - Lewy body dementia, unspecified dementia severity, unspecified whether behavioral, psychotic, or mood disturbance or anxiety (HCC) I63.512 (ICD-10-CM) - 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  PAIN:  Are you having pain? Yes: NPRS scale: 6/10 Pain location: bil knees  FALLS: Has patient fallen in last 6 months?  See PT evaluation for details   PATIENT GOALS: Increase speech clarity, assistance with food clearance  OBJECTIVE:   MBS [facility] April 2024: Patient with mild oropharyngeal dysphagia, like secondary to age-related changes to the swallowing mechanism and generalized deconditioning. Dysphagia is primarily characterized by mistiming of the pharyngeal swallow and reduced supraglottic closure, resulting in penetration of thin liquids during the swallow with intermittent silent aspiration on progression of cord-level residual. Cued post-swallow throat clearing was effective at mitigating penetrants. PAS scores below. Bolus propulsion and UES distension is largely intact. Based on today's examination, recommend patient continue regular solids/thin liquids, pills one at a time, with adherence to aspiration precautions and swallowing strategies including alternation of liquids and solids, SINGLE sips at a time, and regular post-swallow throat clearing after the swallow. Provided risks and adverse outcomes associated with dysphagia and aspiration (i.e possible life-threatening pneumonia) and rationale for adherence to management recommendations. Did provide signs of worsening dysphagia and return to clinic criteria. Will re-evaluate in 6 months, certainly sooner if indicated. Patient and family verbalized understanding of all exam results and recommendations.  Recommendations: Diet: Regular Aspiration precautions:  sit upright during all PO (as close to 90 degrees as possible) alternate bites/sips cough/clear throat regularly during the meal SINGLE sips  Medications: one at a time   PATIENT REPORTED OUTCOME MEASURES (PROM): Communication Effectiveness Survey: forgot on 03/29/23; will bring next session  TODAY'S TREATMENT:                                                                                                                                          DATE:  04/03/23: Pt and Jamie agree that pt is talking louder at home. Throughout session, pt did in fact have louder, WNL volume for much of today's session in conversation, with average overall today 68dB (WNL 70-72dB). In sentence level responses pt's loudness was WNL, even with mod complex responses. Pt to cont to practice everyday sentences at home. Today, pt recited everyday sentences initially with average 73 dB, and with min cues for ensuring pt shouting/using  considerable effort with everyday sentences he improved loudness to 83dB average.   03/29/23: Pt completed conversation in the ST room with [facility] loudness; rare min cues for loudness with monologues, but pt successfully incr'd loudness each time. Asher Muir will bring CES next session. Kais recited his everyday sentences with initial cues for loudness. He indicated again that he did not want to shout, and SLP again reminded him that everyday sentences should be shouting, but this will not carry to conversation. Educated again about rationale for everyday sentences (muscle memory for conversation). Because pt's memory skills are reduced, affecting pt's ability to recall that he needs to speak louder, SLP also educated wife and pt about effective communication techniques. According to Asher Muir she/they have already worked to find and implement some of these techniques already as pt's various challenges with communication began in 2013.  03/27/23: MBS scheduled for 04/07/23. SLP cued pt if he was having difficulty with drooling to swallow first and then wipe excess saliva. SLP provided a CES today to [place] and asked her to complete and return next session. SLP worked with pt's loudness today, first in everyday sentences pt with average 83 dB; in sentence completion tasks averaged 74dB with min A <10% for increasing loudness. In simiarity/difference task, pt's average  loudness was 72dB however pt's responses did not match stimulus 25% of the time and SLP had to provide cues for pt to correctly answer, sometimes using max cues. Suspect fatigue played a role with this, as ST was pt's last therapy today.   03/22/23: Pt needs CES next session.  SLP introduced pt and wife to everyday sentences. Pt thought of 5 and wife generated 4. SLP instructed pt to recite these twice a day in a loud, strong voice, and explained rationale for pt and wife. Pt questioned rationale and SLP drew a diagram with WNL volume and pt's conversational volume and reiterated when he talks loudly it brings his volume into/closer to WNL range. Keilyn spoke in a louder volume for 20 seconds of a 35-second monologue about a general in the [employer] after this, and answered affirmatively when asked if he was trying to use a louder voice. SLP encouraged him that this was WNL volume for a portion of his last conversational turn.  03/15/23 (eval): n/a  PATIENT EDUCATION: Education details: See "today's treatment" for details Person educated: Patient and Spouse Education method: Explanation, Demonstration, and Verbal cues Education comprehension: verbalized understanding and needs further education  HOME EXERCISE PROGRAM: TBD   GOALS: Goals reviewed with patient? Yes (in general)  SHORT TERM GOALS: Target date: 04/14/23  Pt will complete MBS to assess current swallowing status and assess safe swallow strategies Baseline: Goal status: INITIAL  2.  Pt will maintain average mid-upper 80s for loud /a/ in 3 sessions Baseline:  Goal status: INITIAL  3.  Pt will use speech volume average upper 60s dB for 5 minutes simple conversation in two sessions Baseline:  Goal status: INITIAL  4.  pt will generate abdominal breathing at rest (seated position) 80% of the time over 2 sessions Baseline:  Goal status: INITIAL   LONG TERM GOALS: Target date: 05/12/23  Pt will improve PROM measure scores in the  last 1-2 sessions Baseline:  Goal status: INITIAL  2.  Pt will maintain average upper 80s for loud /a/ in 3 sessions Baseline:  Goal status: INITIAL  3.  Pt will use speech volume average low 70s dB for 8 minutes simple-mod complex  conversation with occasional verbal  cues in two sessions Baseline:  Goal status: INITIAL  4.   Pt will generate abdominal breathing 60% of the time in 8 minutes simple-mod complex conversation over 2 sessions Baseline:  Goal status: INITIAL   ASSESSMENT:  CLINICAL IMPRESSION: Patient is a 77 y.o. M who was seen today for treatment of speech intelligibility in light of Parkinsonism. Goals for swallowing TBD following MBS on 04/07/23. See "today's treatment" for more details. "They are always telling me to speak up," pt stated to SLP on eval date. Pt's OT to target compensations for cognition.  OBJECTIVE IMPAIRMENTS: Objective impairments include memory, dysarthria, and dysphagia. These impairments are limiting patient from ADLs/IADLs, effectively communicating at home and in community, and safety when swallowing.Factors affecting potential to achieve goals and functional outcome are ability to learn/carryover information and co-morbidities.. Patient will benefit from skilled SLP services to address above impairments and improve overall function.  REHAB POTENTIAL: Good  PLAN:  SLP FREQUENCY: 2x/week  SLP DURATION: 8 weeks  PLANNED INTERVENTIONS: Aspiration precaution training, Pharyngeal strengthening exercises, Diet toleration management , Environmental controls, Trials of upgraded texture/liquids, Internal/external aids, Oral motor exercises, Functional tasks, SLP instruction and feedback, Compensatory strategies, and Patient/family education    [facility], CCC-SLP 04/03/2023, 2:15 PM

## 2023-04-03 NOTE — Therapy (Signed)
OUTPATIENT OCCUPATIONAL THERAPY NEURO  Treatment Note  Patient Name: Jeff Weaver MRN: [id number] DOB:1945-11-24, 77 y.o., male Today's Date: 04/03/2023  PCP: Pincus Sanes, MD REFERRING PROVIDER: Pincus Sanes, MD  END OF SESSION:  OT End of Session - 04/03/23 1518     Visit Number 5    Number of Visits 17    Date for OT Re-Evaluation 05/12/23    Authorization Type Medicare A&B    OT Start Time 1451    OT Stop Time 1530    OT Time Calculation (min) 39 min                 Past Medical History:  Diagnosis Date   Aortic insufficiency    Atypical chest pain    Cardiac resynchronization therapy pacemaker (CRT-P) in place    Cerebral aneurysm    a. s/p clipping at [facility] in 8/13 c/b short-term memory loss, cerebral hemorrhage and seizure disorder s/p VP shunt.   Cerebral hemorrhage (HCC)    Chronic combined systolic and diastolic CHF (congestive heart failure) (HCC)    CVA (cerebral infarction)    Diverticulosis    Enteritis (regional)    Dr Juanda Chance   First degree AV block    GI bleed    HTN (hypertension)    Hyperlipidemia    Hypertensive heart disease    IBS (irritable bowel syndrome)    Lewy body dementia (HCC)    Mild CAD    Cardiac cath 01/12/18 showed minimal nonobstructive CAD, normal LVEF, normal LVEDP.   Mild dilation of ascending aorta (HCC)    Mitral regurgitation    NSVT (nonsustained ventricular tachycardia) (HCC)    Orthostatic hypotension    Pacemaker    Pre-diabetes    PVC's (premature ventricular contractions)    Regional enteritis of large intestine (HCC) since 1978   Rheumatoid arthritis(714.0)    dxed in [employer] in 1980s   Second degree AV block, Mobitz type I    Seizures (HCC)    last April 26,2021   Sinus bradycardia    Sleep apnea    SSS (sick sinus syndrome) (HCC)    Subdural hematoma (HCC)    Past Surgical History:  Procedure Laterality Date   BIV PACEMAKER INSERTION CRT-P N/A 03/10/2021   Procedure: BIV PACEMAKER INSERTION  CRT-P UPGRADE;  Surgeon: Regan Lemming, MD;  Location: [facility] INVASIVE CV LAB;  Service: Cardiovascular;  Laterality: N/A;   CHOLECYSTECTOMY N/A 01/07/2016   Procedure: LAPAROSCOPIC CHOLECYSTECTOMY ;  Surgeon: Abigail Miyamoto, MD;  Location: [facility] OR;  Service: General;  Laterality: N/A;   cns shunt  02/23/12   COLONOSCOPY W/ POLYPECTOMY  1978   negative 2009, Dr Juanda Chance. Due 2014   CRANIOTOMY  02/02/12   Dr Kelby Aline, [facility]-clipping of aneurysm   CRANIOTOMY  02-02-12   left pterional craniotomy for clipping complex anterior communicating artery aneurysm    HERNIA REPAIR     LEFT HEART CATH AND CORONARY ANGIOGRAPHY N/A 01/12/2018   Procedure: LEFT HEART CATH AND CORONARY ANGIOGRAPHY;  Surgeon: Swaziland, Peter M, MD;  Location: [facility] INVASIVE CV LAB;  Service: Cardiovascular;  Laterality: N/A;   LEFT HEART CATHETERIZATION WITH CORONARY ANGIOGRAM N/A 11/26/2013   Procedure: LEFT HEART CATHETERIZATION WITH CORONARY ANGIOGRAM;  Surgeon: Marykay Lex, MD;  Location: [facility] CATH LAB;  Service: Cardiovascular;  Laterality: N/A;   NOSE SURGERY     PACEMAKER IMPLANT N/A 07/02/2018   Procedure: PACEMAKER IMPLANT;  Surgeon: Regan Lemming, MD;  Location: [facility] INVASIVE CV  LAB;  Service: Cardiovascular;  Laterality: N/A;   SHOULDER SURGERY  1997   TONSILLECTOMY AND ADENOIDECTOMY     VENTRICULOPERITONEAL SHUNT  02-23-12   INSERTION OF RIGHT FRONTAL VENTRICULOPERITONEAL SHUNT WITH A CODMAN HAKIM PROGRAMMABLE VALVE   Patient Active Problem List   Diagnosis Date Noted   Benign paroxysmal positional vertigo of right ear 03/03/2023   COVID-19 03/03/2023   Abrasion, forearm with infection, left, initial encounter 03/03/2023   Physical deconditioning 12/20/2022   Poor balance 12/20/2022   PVC's (premature ventricular contractions) 09/19/2022   Diplopia 09/19/2022   Parkinsonism 06/19/2022   Primary localized osteoarthrosis of shoulder region 05/10/2022   Recurrent dislocation of shoulder joint 05/10/2022   Regional  enteritis (HCC) 05/10/2022   Vestibular hypofunction of left ear 05/10/2022   Sensorineural hearing loss 05/10/2022   Senile osteoporosis 02/03/2022   Pain in right hip 01/05/2022   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   Intertrigo 10/01/2021   HFrEF (heart failure with reduced ejection fraction) (HCC) 09/18/2021   Blepharitis of upper and lower eyelids of both eyes 09/18/2021   Tinea cruris 09/18/2021   Lewy body dementia (HCC) 09/16/2021   Closed fracture of right hip (HCC) 09/16/2021   S/P hip replacement, right  07/2021 09/16/2021   Presence of right artificial hip joint 08/06/2021   Vascular dementia (HCC) 04/29/2021   Multiple falls 04/29/2021   Cough 04/09/2021   Vitamin D deficiency 04/09/2021   Vitamin B1 deficiency 04/09/2021   Right arm pain 01/06/2021   Seizures (HCC)    Muscle cramping 12/30/2019   Dizziness 12/30/2019   Unsteady gait 12/30/2019   Pacemaker 11/27/2019   Fatigue 07/09/2019   SSS (sick sinus syndrome) (HCC) 07/02/2018   Bilateral shoulder pain 06/14/2018   Upper airway cough syndrome vs cough variant asthma 03/20/2018   Acute midline low back pain without sciatica 12/25/2017   Depression 10/17/2017   CAD (coronary artery disease), hx of NSTEMI 08/15/2017   Chest pain 07/17/2017   SOBOE (shortness of breath on exertion) 06/08/2017   Bilateral sensorineural hearing loss 06/01/2017   Nasal turbinate hypertrophy 06/01/2017   Dysphagia 05/31/2017   Osteoporosis 01/30/2017   History of cerebral hemorrhage 12/28/2016   Seborrheic dermatitis 07/25/2016   Cognitive communication deficit 06/28/2016   Hypertensive heart disease 06/28/2016   Vitamin B 12 deficiency 03/16/2016   Confusion 03/13/2016   VP (ventriculoperitoneal) shunt status 02/12/2016   Orthostatic hypotension 01/05/2016   Benign prostatic hyperplasia with urinary obstruction 08/04/2014   Urinary urgency 08/04/2014   Gastroesophageal reflux disease 07/14/2014   Polymyalgia rheumatica (HCC)  05/28/2014   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/2014   SBE (subacute bacterial endocarditis) prophylaxis candidate 11/24/2013   Aphasia 07/17/2013   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 seizures, not intractable, without status epilepticus (HCC) 07/17/2013   Subdural hemorrhage (HCC) 07/17/2013   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HCC) 07/17/2013   Pre-diabetes 03/26/2013   Urinary frequency 10/31/2012   Nocturia 10/31/2012   History of cerebral aneurysm repair 10/18/2012   History of stroke without residual deficits 10/18/2012   Ocular rosacea 09/27/2012   Seizure disorder (HCC) 07/23/2012   Entropion 07/10/2012   Hyperopia with astigmatism 07/10/2012   Combined senile cataract 04/13/2012   Presbyopia 04/13/2012   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) 03/01/2012   Stroke (HCC) 03/01/2012   Cognitive safety issue 02/20/2012   Nonruptured cerebral aneurysm 11/23/2011   Obstructive sleep apnea syndrome 08/09/2011   Hyperlipidemia 02/03/2009   HTN (hypertension) 02/03/2009   History of Crohn's  disease 04/17/2008   Allergic rhinitis, unspecified 07/05/1999   Cataract, right eye 07/05/1999    ONSET DATE: referral date 02/21/23  REFERRING DIAG: R41.841 (ICD-10-CM) - Cognitive communication deficit G31.83,F02.80 (ICD-10-CM) - Lewy body dementia, unspecified dementia severity, unspecified whether behavioral, psychotic, or mood disturbance or anxiety (HCC) I63.512 (ICD-10-CM) - 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 Pt accompanied by: self and family member (spouse)  PERTINENT HISTORY: Aortic insufficiency, CRT-P pacemaker placement, cerebral aneurysm s/p clipping, cerebral hemorrhage and seizure disorder s/p VP shunt, CHF, CVA, second degree AV block Mobitz type 1 s/p PPM, HTN, HLD, hypertensive heart disease, IBS, lew body dementia, mitral regurgitation, NSVT, orthostatic hypotension, RA, SSS, subdural hematoma, hernia repair, cardiac cath  PRECAUTIONS: Fall, ICD/Pacemaker, and Other: poor vision, STM impairments  WEIGHT BEARING RESTRICTIONS: No  PAIN:  Are you having pain? Yes: NPRS scale: 7/10 Pain location: B knees R > L Pain description: aching, discomfort Aggravating factors: prolonged sitting Relieving factors: movement  FALLS: Has patient fallen in last 6 months? Yes. Number of falls 2-3, getting in/out of bed or when turning to pick something up   LIVING ENVIRONMENT: Lives with: lives with their family (daughter comes and goes, will spend a few days per week in the home with them). Lives in: House/apartment Stairs: Yes: Internal: 14 steps; and External: 1 step to enter home  Has following equipment at home: Dan Humphreys - 2 wheeled, Environmental consultant - 4 wheeled, Tour manager, and Grab bars  PLOF: Independent and Independent with basic ADLs  PATIENT GOALS: improve endurance and engagement in ADLs  OBJECTIVE:   HAND DOMINANCE: Right  ADLs: Transfers/ambulation related to ADLs: Mod I to supervision with RW Eating: spouse typically cuts foods for him, pt will lean towards food instead of bringing food to mouth, spoon is preferred utensil Grooming: increased time UB Dressing: increased time, is ableto manage buttons LB Dressing: wears disposable underwear and pull on pants for increased ease Toileting: Mod I, does have a grab bar in front that he will pull up on; difficulty with hygiene post BM Bathing: spouse assists with bathing, requires encouragement to  bathe Tub Shower transfers: spouse assists with transfers Equipment: Transfer tub bench and Grab bars  IADLs: Medication management: spouse fills pill box and will remind him to take  MOBILITY STATUS: Needs Assist: Mod I to Supervision with RW and Hx of falls; required min assist to stand from chair without arm rests  POSTURE COMMENTS:  rounded shoulders, forward head, decreased lumbar lordosis, increased thoracic kyphosis, and right head tilt  ACTIVITY TOLERANCE: Activity tolerance: diminished  UPPER EXTREMITY ROM:    Active ROM Right eval Left eval  Shoulder flexion 90 110  Shoulder abduction    Shoulder adduction    Shoulder extension    Shoulder internal rotation 80% 70%  Shoulder external rotation 80% 80%  Elbow flexion    Elbow extension    Wrist flexion    Wrist extension    Wrist ulnar deviation    Wrist radial deviation    Wrist pronation    Wrist supination    (Blank rows = not tested)  HAND FUNCTION:  03/29/23 Grip strength: Right: 45, 40, 36 lbs; Left: 41, 36, 33 lbs  COORDINATION: 03/22/23 9 Hole Peg test: Right: 53.19 sec; Left: 1:17.28 sec Box and Blocks:  Right 26 blocks, Left 23 blocks  COGNITION: Overall cognitive status: History of cognitive impairments - at baseline Brief Interview for Mental Status:   Memory:  Word Repetition:   "Sock"  "Blue"  "Bed"   Orientation Level:    Year:   2024  Month: Sept  Day of Week: Weds  Memory:  Recall:     Unable to recall "Sock"  with cue   Able to recall  "Blue"  without cue   Able to recall  "Bed"   with cue     No Recall   VISION: Subjective report: diplopia.  Recently went to ophthalmologist who applied a Fresnel prism 8 base in on the right lens of his readers. Baseline vision: Wears glasses for reading only  VISION ASSESSMENT: Impaired To be further assessed in functional context   TODAY'S TREATMENT:                                                                         DATE: 04/03/23 Hand Gripper: with LUE then RUE on level 35# with yellow spring. Pt picked up 1 inch blocks with gripper with 2/10 drops with LUE and min difficulty and no drops with RUE.  Increased resistance on R to 50#.  Pt dropping 25% of blocks and reaching farther across midline.  Therefore OT repositioned items to midline with pt demonstrating improved success to completing 3/4 of remaining blocks without dropping. Coordination/vision: engaged in stacking of 1" blocks with focus on coordination, sustained attention, and visual attention to task.  Pt reports diplopia impacting success, therefore providing cues for positioning of items, use of glasses with Fresnel lenses, and other compensatory strategies to improve vision. Theraband: reviewed BUE strengthening with red theraband with OT providing min multimodal cues for sequencing and proper technique.  Pt completing 1 set of horizontal abduction with cues for symmetry and 1 set of 10 shoulder diagonals bilaterally.  OT providing demonstration and cues for "thumb up" position to increase ROM during diagonals.   03/29/23 Grip strength: Assessed grip strength (see above for measurements).  Pt reports difficulty with opening containers and sustained grasp with clothing management.  OT educated on adaptive equipment to aid in opening containers.  Pt trialing grip pad as well as jar opener for increased grasp when opening tight jar.  Pt to take grip pad home to continue to use, wife taking note of adaptive jar opener as option to purchase as needed. Theraband: engaged in BUE strengthening with red theraband with OT providing mod multimodal cues for sequencing and proper technique.  Pt completing 1 set of horizontal abduction with cues for symmetry and 1 set of 10 shoulder diagonals bilaterally.  OT providing demonstration and cues for "thumb up" position to increase ROM.   03/27/23 Remote control: discussed adaptive remotes with large buttons, with and  without numbers.  Discussed calling [employer] to see if they have an easier model to ensure communication with current TV system. Adaptive techniques for LB dressing: attempted simulated LB dressing with use of gait belt, however pt with difficulty due  to impaired cognition with use of belt to simulate pants.  Pt demonstrating good sit > stand with and without use of BUE to push up from arm chair.  Transitioned to OT providing demonstration of various sitting positions and pt attempting figure 4 position as needed to thread pant legs.  OT providing pt and family with additional recommendations and modifications to attempt at home, to include modified figure 4 position at EOB, use of step stool, alternating UE support on RW when standing to pull pants over hips in back.  Provided with handouts from OT toolkit for family to attempt with pt at home.   PATIENT EDUCATION: Education details: ongoing condition specific education Person educated: Patient, Spouse, and Child(ren) Education method: Explanation Education comprehension: verbalized understanding and needs further education  HOME EXERCISE PROGRAM: Access Code: [email or web address] URL: [email or web address] Date: 03/29/2023 Prepared by: [facility] - Outpatient  Rehab - [facility]  Exercises - Seated Shoulder Horizontal Abduction with Resistance  - 2 x daily - 10 reps - 3-5 sec hold - Seated Shoulder Diagonal with Resistance  - 2 x daily - 10 reps - 3-5 sec hold   GOALS: Goals reviewed with patient? Yes  SHORT TERM GOALS: Target date: 04/14/23  Pt and caregiver will be independent with gentle ROM HEP for improved functional use of BUE. Baseline: Goal status: IN PROGRESS  2.  Pt will verbalize understanding of task modifications and/or potential AE needs to increase ease, safety, and independence w/ ADLs Baseline:  Goal status: IN PROGRESS  3.  Pt will verbalize understanding of use of new glasses with Fresnel lenses for  increased visual perception as needed for ADLs and IADLs. Baseline:  Goal status: IN PROGRESS   LONG TERM GOALS: Target date: 05/12/23  Pt will demonstrate and/or report ability to complete LB dressing to include footwear at Mod I level utilizing adaptive techniques and/or AE PRN. Baseline:  Goal status: IN PROGRESS  2.  Pt will be able to complete toileting hygiene at Mod I level with adaptive techniques and/or AE PRN. Baseline:  Goal status: IN PROGRESS  3.  Pt will demonstrate improved internal rotation as needed for clothing management and hygiene post toileting. Baseline:  Goal status: IN PROGRESS  4.  Pt will demonstrate improved sustained attention to task with ability to complete table top task for 10 mins with 1 or fewer cues for attention to task. Baseline:  Goal status: IN PROGRESS  ASSESSMENT:  CLINICAL IMPRESSION: Pt continues to require increased time for sequencing and following of directions due to verbosity and requiring increased time for processing.  Pt demonstrating good technique with horizontal abduction/chest press and R shoulder diagonals, however difficulty with L shoulder diagonals benefiting from hand over hand to facilitate increased technique.  Pt continues to demonstrate impairments with coordination, largely due to attention to task and vision.  PERFORMANCE DEFICITS: in functional skills including ADLs, IADLs, ROM, pain, flexibility, Gross motor control, balance, body mechanics, endurance, and decreased knowledge of use of DME, cognitive skills including attention, memory, problem solving, and safety awareness, and psychosocial skills including coping strategies, environmental adaptation, and habits.     PLAN:  OT FREQUENCY: 2x/week  OT DURATION: 8 weeks  PLANNED INTERVENTIONS: self care/ADL training, therapeutic exercise, therapeutic activity, neuromuscular re-education, passive range of motion, balance training, functional mobility training,  ultrasound, compression bandaging, moist heat, cryotherapy, patient/family education, cognitive remediation/compensation, visual/perceptual remediation/compensation, psychosocial skills training, energy conservation, coping strategies training, and DME and/or AE instructions  RECOMMENDED OTHER SERVICES: NA  CONSULTED AND AGREED WITH PLAN OF CARE: Patient and family member/caregiver  PLAN FOR NEXT SESSION: adaptive techniques for LB dressing, sit > stand, grip and UB strengthening   Cole Eastridge, OTR/L 04/03/2023, 3:19 PM

## 2023-04-03 NOTE — Therapy (Signed)
OUTPATIENT PHYSICAL THERAPY NEURO TREATMENT   Patient Name: Jeff Weaver MRN: [id number] DOB:[birth year], 77 y.o., male Today's Date: 04/03/2023   PCP: Pincus Sanes, MD REFERRING PROVIDER: Pincus Sanes, MD  END OF SESSION:  PT End of Session - 04/03/23 1537     Visit Number 8    Number of Visits 25    Date for PT Re-Evaluation 05/15/23    Authorization Type MCR/[employer]    Authorization Time Period 02/20/23 to 05/15/23    Progress Note Due on Visit 10    PT Start Time 1535    PT Stop Time 1615    PT Time Calculation (min) 40 min    Equipment Utilized During Treatment Gait belt    Activity Tolerance Patient tolerated treatment well    Behavior During Therapy [facility] for tasks assessed/performed                 Past Medical History:  Diagnosis Date   Aortic insufficiency    Atypical chest pain    Cardiac resynchronization therapy pacemaker (CRT-P) in place    Cerebral aneurysm    a. s/p clipping at [facility] in 8/13 c/b short-term memory loss, cerebral hemorrhage and seizure disorder s/p VP shunt.   Cerebral hemorrhage (HCC)    Chronic combined systolic and diastolic CHF (congestive heart failure) (HCC)    CVA (cerebral infarction)    Diverticulosis    Enteritis (regional)    Dr Juanda Chance   First degree AV block    GI bleed    HTN (hypertension)    Hyperlipidemia    Hypertensive heart disease    IBS (irritable bowel syndrome)    Lewy body dementia (HCC)    Mild CAD    Cardiac cath 01/12/18 showed minimal nonobstructive CAD, normal LVEF, normal LVEDP.   Mild dilation of ascending aorta (HCC)    Mitral regurgitation    NSVT (nonsustained ventricular tachycardia) (HCC)    Orthostatic hypotension    Pacemaker    Pre-diabetes    PVC's (premature ventricular contractions)    Regional enteritis of large intestine (HCC) since 1978   Rheumatoid arthritis(714.0)    dxed in [employer] in 1980s   Second degree AV block, Mobitz type I    Seizures (HCC)    last April 26,2021    Sinus bradycardia    Sleep apnea    SSS (sick sinus syndrome) (HCC)    Subdural hematoma (HCC)    Past Surgical History:  Procedure Laterality Date   BIV PACEMAKER INSERTION CRT-P N/A 03/10/2021   Procedure: BIV PACEMAKER INSERTION CRT-P UPGRADE;  Surgeon: Regan Lemming, MD;  Location: [facility] INVASIVE CV LAB;  Service: Cardiovascular;  Laterality: N/A;   CHOLECYSTECTOMY N/A 01/07/2016   Procedure: LAPAROSCOPIC CHOLECYSTECTOMY ;  Surgeon: Abigail Miyamoto, MD;  Location: [facility] OR;  Service: General;  Laterality: N/A;   cns shunt  02/23/12   COLONOSCOPY W/ POLYPECTOMY  1978   negative 2009, Dr Juanda Chance. Due 2014   CRANIOTOMY  02/02/12   Dr Kelby Aline, [facility]-clipping of aneurysm   CRANIOTOMY  02-02-12   left pterional craniotomy for clipping complex anterior communicating artery aneurysm    HERNIA REPAIR     LEFT HEART CATH AND CORONARY ANGIOGRAPHY N/A 01/12/2018   Procedure: LEFT HEART CATH AND CORONARY ANGIOGRAPHY;  Surgeon: Swaziland, Peter M, MD;  Location: [facility] INVASIVE CV LAB;  Service: Cardiovascular;  Laterality: N/A;   LEFT HEART CATHETERIZATION WITH CORONARY ANGIOGRAM N/A 11/26/2013   Procedure: LEFT HEART CATHETERIZATION WITH CORONARY ANGIOGRAM;  Surgeon: Marykay Lex, MD;  Location: [facility] CATH LAB;  Service: Cardiovascular;  Laterality: N/A;   NOSE SURGERY     PACEMAKER IMPLANT N/A 07/02/2018   Procedure: PACEMAKER IMPLANT;  Surgeon: Regan Lemming, MD;  Location: [facility] INVASIVE CV LAB;  Service: Cardiovascular;  Laterality: N/A;   SHOULDER SURGERY  1997   TONSILLECTOMY AND ADENOIDECTOMY     VENTRICULOPERITONEAL SHUNT  02-23-12   INSERTION OF RIGHT FRONTAL VENTRICULOPERITONEAL SHUNT WITH A CODMAN HAKIM PROGRAMMABLE VALVE   Patient Active Problem List   Diagnosis Date Noted   Benign paroxysmal positional vertigo of right ear 03/03/2023   COVID-19 03/03/2023   Abrasion, forearm with infection, left, initial encounter 03/03/2023   Physical deconditioning 12/20/2022   Poor balance 12/20/2022    PVC's (premature ventricular contractions) 09/19/2022   Diplopia 09/19/2022   Parkinsonism 06/19/2022   Primary localized osteoarthrosis of shoulder region 05/10/2022   Recurrent dislocation of shoulder joint 05/10/2022   Regional enteritis (HCC) 05/10/2022   Vestibular hypofunction of left ear 05/10/2022   Sensorineural hearing loss 05/10/2022   Senile osteoporosis 02/03/2022   Pain in right hip 01/05/2022   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   Intertrigo 10/01/2021   HFrEF (heart failure with reduced ejection fraction) (HCC) 09/18/2021   Blepharitis of upper and lower eyelids of both eyes 09/18/2021   Tinea cruris 09/18/2021   Lewy body dementia (HCC) 09/16/2021   Closed fracture of right hip (HCC) 09/16/2021   S/P hip replacement, right  07/2021 09/16/2021   Presence of right artificial hip joint 08/06/2021   Vascular dementia (HCC) 04/29/2021   Multiple falls 04/29/2021   Cough 04/09/2021   Vitamin D deficiency 04/09/2021   Vitamin B1 deficiency 04/09/2021   Right arm pain 01/06/2021   Seizures (HCC)    Muscle cramping 12/30/2019   Dizziness 12/30/2019   Unsteady gait 12/30/2019   Pacemaker 11/27/2019   Fatigue 07/09/2019   SSS (sick sinus syndrome) (HCC) 07/02/2018   Bilateral shoulder pain 06/14/2018   Upper airway cough syndrome vs cough variant asthma 03/20/2018   Acute midline low back pain without sciatica 12/25/2017   Depression 10/17/2017   CAD (coronary artery disease), hx of NSTEMI 08/15/2017   Chest pain 07/17/2017   SOBOE (shortness of breath on exertion) 06/08/2017   Bilateral sensorineural hearing loss 06/01/2017   Nasal turbinate hypertrophy 06/01/2017   Dysphagia 05/31/2017   Osteoporosis 01/30/2017   History of cerebral hemorrhage 12/28/2016   Seborrheic dermatitis 07/25/2016   Cognitive communication deficit 06/28/2016   Hypertensive heart disease 06/28/2016   Vitamin B 12 deficiency 03/16/2016   Confusion 03/13/2016   VP (ventriculoperitoneal)  shunt status 02/12/2016   Orthostatic hypotension 01/05/2016   Benign prostatic hyperplasia with urinary obstruction 08/04/2014   Urinary urgency 08/04/2014   Gastroesophageal reflux disease 07/14/2014   Polymyalgia rheumatica (HCC) 05/28/2014   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/2014   SBE (subacute bacterial endocarditis) prophylaxis candidate 11/24/2013   Aphasia 07/17/2013   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 seizures, not intractable, without status epilepticus (HCC) 07/17/2013   Subdural hemorrhage (HCC) 07/17/2013   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HCC) 07/17/2013   Pre-diabetes 03/26/2013   Urinary frequency 10/31/2012   Nocturia 10/31/2012   History of cerebral aneurysm repair 10/18/2012   History of stroke without residual deficits 10/18/2012   Ocular rosacea 09/27/2012   Seizure disorder (HCC) 07/23/2012   Entropion 07/10/2012   Hyperopia with astigmatism 07/10/2012   Combined senile cataract 04/13/2012   Presbyopia 04/13/2012   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) 03/01/2012   Stroke (HCC) 03/01/2012   Cognitive safety issue 02/20/2012   Nonruptured cerebral aneurysm 11/23/2011   Obstructive sleep apnea syndrome 08/09/2011   Hyperlipidemia 02/03/2009   HTN (hypertension) 02/03/2009   History of Crohn's disease 04/17/2008   Allergic rhinitis, unspecified 07/05/1999   Cataract, right eye 07/05/1999    ONSET DATE: chronic   REFERRING DIAG: R26.89 (ICD-10-CM) - Poor balance R53.81 (ICD-10-CM) - Physical deconditioning  THERAPY DIAG:  Muscle weakness (generalized)  Unsteadiness on feet  Other abnormalities of gait and mobility  Abnormal posture  Rationale for Evaluation and Treatment: Rehabilitation  SUBJECTIVE:                                                                                                                                                                                             SUBJECTIVE  STATEMENT: Feeling ok today, had a rough weekend   Pt accompanied by: significant other  PERTINENT HISTORY: Aortic insufficiency, CRT-P pacemaker placement, cerebral aneurysm s/p clipping, cerebral hemorrhage and seizure disorder s/p VP shunt, CHF, CVA, second degree AV block Mobitz type 1 s/p PPM, HTN, HLD, hypertensive heart disease, IBS, lew body dementia, mitral regurgitation, NSVT, orthostatic hypotension, RA, SSS, subdural hematoma, hernia repair, cardiac cath  PAIN:  Are you having pain? Yes: NPRS scale: "B LEs and knees"/10 Pain location: "B LEs and knees"  Pain description: discomfort, some radicular pains down legs especially in the mornings  Aggravating factors: standing up, sitting unsupported Relieving factors: movement  PRECAUTIONS: Fall and ICD/Pacemaker, poor vision, STM impairments  RED FLAGS: None   WEIGHT BEARING RESTRICTIONS: No  FALLS: Has patient fallen in last 6 months? Yes. Number of falls 2 in the past 6 months, had additional falls >6 months ago; per spouse, falls a lot when bending to pick something up off the floor, fall with SDH and hip fracture in 2023 was due to slipping on wet grass outside   LIVING ENVIRONMENT: Lives with: lives with their family Lives in: House/apartment Stairs: Yes: Internal: 14 steps; one step to enter home  Has following equipment at home: Single point cane and "semi-hospital bed that adjusts the head, walker with 2 wheels, 4WW and WC, shower bench"   PLOF: Independent, Independent with basic ADLs, Requires assistive device for independence, and handheld shower head; per spouse "house is older and difficult to renovate, may try to convert bathroom to a shower stall  PATIENT GOALS: improve strength, balance, endurance   OBJECTIVE:   TODAY'S TREATMENT: 04/03/23 Activity Comments  NU-step speed intervals x 6 min 2 min warm-up 30 sec speed; 30 sec slow --needs  assist for speed intervals approx 50% assist to maintain 80+ SPM   Dynamic balance in // bars -forward/retrowalk x 2 min -sidestepping x 2 min  Pre-gait -techniques for step height/length 2x10 over hurdles -forwards/backwards over 6" hurdles for step advancement   Standing on foam -EO x 60 sec -EC x 6 trials  Gait training Techniques to improve step height length with visual/verbal cues          PATIENT EDUCATION: (SELF CARE) Education details: rationale of balance exercises Person educated: Patient and Spouse Education method: Explanation, Demonstration, Tactile cues, Verbal cues, and Handouts Education comprehension: verbalized understanding and returned demonstration   HOME EXERCISE PROGRAM: Access Code: [email or web address] URL: [email or web address] Date: 03/13/2023 Prepared by: [facility] - Outpatient  Rehab - [facility]  Program Notes walking program: practice walking with the walker for 5 minutes inside the house every hour   Exercises - Seated March with Resistance  - 1 x daily - 5 x weekly - 2 sets - 10 reps - Seated Long Arc Quad  - 1 x daily - 5 x weekly - 3 sets - 10 reps - Seated Ankle Dorsiflexion AROM  - 1 x daily - 7 x weekly - 3 sets - 10 reps - 3 sec hold - Seated Heel Raise  - 1 x daily - 5 x weekly - 2 sets - 10 reps - Cervical Extension AROM with Strap  - 1 x daily - 7 x weekly - 3 sets - 10 reps - Side to Side Weight Shift with Overhead Reach and Counter Support  - 1 x daily - 7 x weekly - 3 sets - 10 reps - Standing Gastroc Stretch at Counter  - 1 x daily - 7 x weekly - 3 sets - 30-60 sec hold   Added: sitting PWR! Moves handout  Use of boomwhackers/paper towel roll with twist and step 10x each    ----------------------------------------------------- Objective measures below taken at initial evaluation:  COGNITION: Overall cognitive status: History of cognitive impairments - at baseline       POSTURE: rounded shoulders, forward head, decreased lumbar lordosis, and increased thoracic kyphosis,  flexed at hips     LOWER EXTREMITY MMT:    MMT Right Eval Left Eval  Hip flexion 3 3  Hip extension    Hip abduction 3 seated  3 seated  Hip adduction    Hip internal rotation    Hip external rotation    Knee flexion    Knee extension 4 4  Ankle dorsiflexion 3+ 3+  Ankle plantarflexion    Ankle inversion    Ankle eversion    (Blank rows = not tested)    TRANSFERS: Assistive device utilized: Single point cane  Sit to stand: SBA Stand to sit: SBA Chair to chair: SBA      GAIT: Gait pattern: step through pattern, decreased arm swing- Right, decreased arm swing- Left, decreased step length- Right, decreased step length- Left, decreased stance time- Right, decreased stance time- Left, decreased stride length, decreased ankle dorsiflexion- Right, decreased ankle dorsiflexion- Left, Right foot flat, Left foot flat, shuffling, trendelenburg, decreased trunk rotation, trunk flexed, and wide BOS Distance walked: in clinic distances  Assistive device utilized: Single point cane Level of assistance: SBA Comments: slow and steady with SPC   FUNCTIONAL TESTS:  5 times sit to stand: 58 seconds use of UEs on chair  Timed up and go (TUG): 44 seconds, SPC  Gait speed 0.14 m/s with SPC   PATIENT SURVEYS:  FOTO next visit       GOALS: Goals reviewed with patient? Yes  SHORT TERM GOALS: Target date: 04/03/2023    Will be compliant with appropriate progressive HEP with no more than MinA from family  Baseline: Goal status: MET  2.  Will improve gait speed to at least 0.21m/s with LRAD  Baseline: 0.3 w/ RW Goal status: MET  3.  Will complete TUG test in 30 seconds or better with LRAD  Baseline:  Goal status: IN PROGRESS  4.  Will complete 5xSTS test in 45 seconds or less with UEs  Baseline: 60 sec Goal status: NOT MET  5.  Will tolerate walking and/or activity in PT for at least 10 minutes without needing a rest break  Baseline:  Goal status: MET    LONG TERM  GOALS: Target date: 05/15/2023    MMT to improve by one grade in all weak groups  Baseline:  Goal status: IN PROGRESS  2.  Will be able to ambulate at least 379ft in (0.54m/s) with LRAD to show improved community access no rest breaks   Baseline:  Goal status: IN PROGRESS  3.  Will score at least 42 on the Berg to show reduced fall risk  Baseline:  Goal status: IN PROGRESS  4.  Will complete 5xSTS test in 20 seconds or less with UEs to show improved functional mobility  Baseline:  Goal status: IN PROGRESS  5.  Will be compliant with appropriate progressive long term exercise plan with no more than MinA from family to combat sedentary lifestyle and maintain functional gains from PT  Baseline:  Goal status: IN PROGRESS    ASSESSMENT:  CLINICAL IMPRESSION: Initiated activities with NU-step for assisted speed intervals to improve rapid alternating movements, requiring physical assist to maintain requisite 80+ SPM with poor maintenance under own volition.  Review of STG with improved gait speed using RW. Continued with balance and gait/pre-gait activities to improve step height and facilitate stride length to improve efficiency of ambulation.  Minimal change with 5xSTS test although able to perform initial repetition without UE assist but required for remaining.  Static standing balance activities to improve proprioceptive awareness and postural stability with difficulty under eyes closed conditions but able to use UE support in timely manner to prevent fall with LOB. Continued sessions to progress POC details to improve mobility and reduce fall risk OBJECTIVE IMPAIRMENTS: Abnormal gait, decreased activity tolerance, decreased balance, decreased cognition, decreased coordination, decreased knowledge of use of DME, decreased mobility, difficulty walking, decreased strength, decreased safety awareness, postural dysfunction, and obesity.   ACTIVITY LIMITATIONS: lifting, bending, standing,  squatting, stairs, transfers, bed mobility, and locomotion level  PARTICIPATION LIMITATIONS: meal prep, cleaning, shopping, community activity, yard work, and church  PERSONAL FACTORS: Age, Fitness, Past/current experiences, Social background, and Time since onset of injury/illness/exacerbation are also affecting patient's functional outcome.   REHAB POTENTIAL: Good  CLINICAL DECISION MAKING: Evolving/moderate complexity  EVALUATION COMPLEXITY: Moderate  PLAN:  PT FREQUENCY: 2x/week  PT DURATION: 12 weeks  PLANNED INTERVENTIONS: Therapeutic exercises, Therapeutic activity, Neuromuscular re-education, Balance training, Gait training, Patient/Family education, Self Care, Stair training, DME instructions, Aquatic Therapy, Dry Needling, Cognitive remediation, Cryotherapy, Moist heat, Manual therapy, and Re-evaluation  PLAN FOR NEXT SESSION: need to do TUG test for STG  3:38 PM, 04/03/23 M. Shary Decamp, PT, DPT Physical Therapist- [place] Office Number: [phone]

## 2023-04-05 ENCOUNTER — Ambulatory Visit: Payer: Medicare Other | Admitting: Physical Therapy

## 2023-04-05 ENCOUNTER — Ambulatory Visit: Payer: Medicare Other | Admitting: Occupational Therapy

## 2023-04-05 ENCOUNTER — Ambulatory Visit: Payer: Medicare Other

## 2023-04-06 DIAGNOSIS — G40209 Localization-related (focal) (partial) symptomatic epilepsy and epileptic syndromes with complex partial seizures, not intractable, without status epilepticus: Secondary | ICD-10-CM | POA: Diagnosis not present

## 2023-04-06 DIAGNOSIS — R41841 Cognitive communication deficit: Secondary | ICD-10-CM | POA: Diagnosis not present

## 2023-04-07 ENCOUNTER — Ambulatory Visit (HOSPITAL_COMMUNITY)
Admission: RE | Admit: 2023-04-07 | Discharge: 2023-04-07 | Disposition: A | Discharge status: Home / Self Care | Payer: Medicare Other | Source: Ambulatory Visit

## 2023-04-07 ENCOUNTER — Ambulatory Visit (HOSPITAL_COMMUNITY)
Admission: RE | Admit: 2023-04-07 | Discharge: 2023-04-07 | Disposition: A | Discharge status: Home / Self Care | Payer: Medicare Other | Source: Ambulatory Visit | Attending: Internal Medicine

## 2023-04-07 DIAGNOSIS — G40909 Epilepsy, unspecified, not intractable, without status epilepticus: Secondary | ICD-10-CM | POA: Diagnosis not present

## 2023-04-07 DIAGNOSIS — R1310 Dysphagia, unspecified: Secondary | ICD-10-CM

## 2023-04-07 DIAGNOSIS — F0280 Dementia in other diseases classified elsewhere without behavioral disturbance: Secondary | ICD-10-CM | POA: Insufficient documentation

## 2023-04-07 DIAGNOSIS — K5090 Crohn's disease, unspecified, without complications: Secondary | ICD-10-CM | POA: Diagnosis not present

## 2023-04-07 DIAGNOSIS — Z982 Presence of cerebrospinal fluid drainage device: Secondary | ICD-10-CM | POA: Diagnosis not present

## 2023-04-07 DIAGNOSIS — Z8673 Personal history of transient ischemic attack (TIA), and cerebral infarction without residual deficits: Secondary | ICD-10-CM | POA: Insufficient documentation

## 2023-04-07 DIAGNOSIS — G3183 Dementia with Lewy bodies: Secondary | ICD-10-CM | POA: Insufficient documentation

## 2023-04-07 DIAGNOSIS — R1312 Dysphagia, oropharyngeal phase: Secondary | ICD-10-CM | POA: Diagnosis not present

## 2023-04-07 DIAGNOSIS — I5042 Chronic combined systolic (congestive) and diastolic (congestive) heart failure: Secondary | ICD-10-CM | POA: Diagnosis not present

## 2023-04-07 DIAGNOSIS — I110 Hypertensive heart disease with heart failure: Secondary | ICD-10-CM | POA: Insufficient documentation

## 2023-04-07 DIAGNOSIS — R059 Cough, unspecified: Secondary | ICD-10-CM

## 2023-04-07 NOTE — Progress Notes (Addendum)
Modified Barium Swallow Study  Patient Details  Name: Jeff Weaver MRN: [id number] Date of Birth: April 19, 1946  Today's Date: 04/07/2023  Modified Barium Swallow completed.  Full report located under Chart Review in the Imaging Section.  History of Present Illness Jeff Weaver is a delightful 77 y.o. male who was referred for an OP MBS.  Patient is accompanied by his wife.  Complex medical history includes Lewy body dementia, left ICA aneurysm s/p craniotomy, left frontotemportal cerebral hemorrhage, VP shunt (2013) CHF, seizure disorder, Parkinson's, Crohn's disease. Pt has participated intermittently in OP SLP services over the last few years, most recently resuming therapy in September of this year. Pt reports drooling, food sticking in throat, and occasional coughing at mealtime. MBS at [facility]  4/24: "Patient with mild oropharyngeal dysphagia, like secondary to age-related changes to the swallowing mechanism and generalized deconditioning. Dysphagia is primarily characterized by mistiming of the pharyngeal swallow and reduced supraglottic closure, resulting in penetration of thin liquids during the swallow with intermittent silent aspiration on progression of cord-level residual. Cued post-swallow throat clearing was effective at mitigating penetrants. Bolus propulsion and UES distension is largely intact. Based on today's examination, recommend patient continue regular solids/thin liquids, pills one at a time, with adherence to aspiration precautions and swallowing strategies including alternation of liquids and solids, SINGLE sips at a time, and regular post-swallow throat clearing after the swallow."   Clinical Impression Swallowing continues to be notable for mistiming of the pharyngeal swallow with thin, nectar, and honey-thick liquids entering the laryngeal vestibule prior to its closure. This led to intermittent, trace aspiration of all tested liquids. Aspiration was generally silent, excluding  one occasion of coughing. There was limited laryngeal elevation or anterior movement, but base-of-tongue retraction and pharyngeal stripping helped close the epiglottis over larynx and contributed to clearance of boluses through pharynx. Curvature of cervical vertebrae contributed to risk of aspiration.  Chair was reclined in an effort to facilitate travel of liquids through posterior pharynx; this position may have helped reduce quantity of aspiration although it was difficult to quantify.  Pt viewed the video in real time and results were discussed with Jeff Weaver and Jeff Weaver afterwards.  Pt's swallowing has been compromised at least since aspiration was documented in April's MBS.  He has thus far not developed pneumonia. We discussed the importance of activity/mobility as well as pursuing further therapy to minimize the impact of aspiration.  Pt should continue regular solids/thin liquids; meds whole in  puree; sips of liquid should be taken one at a time.  Recommend focus of tx on laryngeal mobility and timing of pharyngeal swallow.  Pt will follow up with Verdie Mosher, OP SLP. Factors that may increase risk of adverse event in presence of aspiration Jeff Weaver & Clearance Jeff Weaver 2021): Frequent aspiration of large volumes  Swallow Evaluation Recommendations Recommendations: PO diet PO Diet Recommendation: Regular;Thin liquids (Level 0) Liquid Administration via: Cup;Straw Medication Administration: Whole meds with puree Supervision: Patient able to self-feed Swallowing strategies  : Small bites/sips Postural changes: Partially reclined for meals       Jeff Weaver L. Samson Frederic, MA CCC/SLP Clinical Specialist - Acute Care SLP Acute Rehabilitation Services Office number [phone]   Blenda Mounts Laurice 04/07/2023,2:28 PM

## 2023-04-10 ENCOUNTER — Ambulatory Visit: Payer: Medicare Other

## 2023-04-10 ENCOUNTER — Ambulatory Visit: Payer: Medicare Other | Admitting: Occupational Therapy

## 2023-04-11 ENCOUNTER — Other Ambulatory Visit: Payer: Self-pay

## 2023-04-11 DIAGNOSIS — K5010 Crohn's disease of large intestine without complications: Secondary | ICD-10-CM

## 2023-04-11 DIAGNOSIS — R197 Diarrhea, unspecified: Secondary | ICD-10-CM

## 2023-04-11 DIAGNOSIS — R159 Full incontinence of feces: Secondary | ICD-10-CM

## 2023-04-11 DIAGNOSIS — R140 Abdominal distension (gaseous): Secondary | ICD-10-CM

## 2023-04-11 NOTE — Progress Notes (Unsigned)
Cardiology Office Note    Date:  04/12/2023  ID:  Jeff Weaver, DOB 04-Jan-1946, MRN [id number] PCP:  Pincus Sanes, MD  Cardiologist:  Armanda Magic, MD  Electrophysiologist:  Regan Lemming, MD   Chief Complaint: cardiac f/u of HTN, CHF, orthostasis   History of Present Illness: .    Jeff Weaver is a 77 y.o. 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   He has a long history of atypical chest pain. Last cath was in 2019 showing minimal nonobstructive CAD, normal LVEF, normal LVEDP. He had 2nd degree AVB type 1 during cath prompting monitor 02/2018 which showed profound bradycardia, AVB with frequently nonconducted P waves, frequent PVCs (7%) and short runs of NSVT so underwent PPM implantation. Repeat echo 11/2020 demonstrated a decline in EF to 35% possibly due to RV pacing. Nuclear stress test 12/2020 was negative for ischemia. He underwent upgrade to CRT-P in 03/2021 with subsequent normalization of LVEF. Though he's done reasonably well from a cardiac standpoint since that time, he has had several complex medical admissions. He's had progressive cognitive decline ultimately felt to have Lewy body dementia and ground level fall (07/2021) 2/2 pre-syncope with right hip fracture requiring replacement, rib fracture, subdural hematoma managed conservatively, aspiration penumonitis, delirium, and UTI. Of note, wife Jeff Weaver has previously requested not to specifically discuss dementia diagnosis  with the patient as he would get upset and worked up about this. Amlodipine and losartan were previously discontinued and metoprolol reduced due to hypotension. He has not been on more aggressive doses of diuretic due to frequent urination and incontinence. I have offered midodrine due to concern for orthostasis. They have preferred to dose on PRN basis. Repeat echo 09/2022 EF 55-60%, mild LVH, G1Dd, mildly enlarged RV with normal RV function, mild LAE, mild MR, moderate AI, borderline dilation of aortic root and ascending aorta. He saw Dr. Elberta Fortis 12/08/22 who noted histograms were quite flat so rate response was adjusted.   He returns for follow-up. Overall he is stable from a cardiac standpoint. His chronic atypical chest pains and DOE do not sound any different than prior. His edema looks to be well controlled on exam. He has not had to take midodrine PRN recently. They are going to check at home whether he is on metoprolol 12.5mg  or 25mg  daily. His main concerns are noncardiac in nature, primarily related to GI issues for which he is following with Dr. Rhea Belton. He is also pending upcoming dental work. He finished a course of abx recently. Apparently a long time ago he was told he would need pre-dental antibiotics and this was being prescribed by his dentist. The term "SBE prophylaxis candidate" was entered into his chart back in 2015 by outside source for unclear reasons. He does not have a history of any valvular repairs or endocarditis. Our office has not previously advised SBE ppx. It's unclear if perhaps there was a noncardiac indication for this.   Labwork independently reviewed: 03/2023 LFTs OK, trig 94, LDL 63 01/2023 Hgb 14.1,  plt 165, Na 134, Cr 0.99, LFTs wnl , Mg 2.1, K 4.0, Cr 0.93  ROS: .    Please see the history of present illness. .  All other systems are reviewed and otherwise negative.  Studies Reviewed: Marland Kitchen    EKG:  EKG is ordered today, personally reviewed, demonstrating AV dual  paced rhythm 66bpm  CV Studies: Cardiac studies reviewed are outlined and summarized above. Otherwise please see EMR for full report.   Current Reported Medications:.    Current Meds  Medication Sig   acetaminophen (TYLENOL) 325 MG tablet Take 325-650 mg by mouth every 6 (six) hours as needed (pain/headaches.).   amoxicillin (AMOXIL) 500 MG capsule Take 500 mg by mouth every 8 (eight) hours.   carbidopa-levodopa (SINEMET IR) 25-100 MG tablet as directed.   cetirizine (ZYRTEC) 10 MG tablet Take 1 tablet (10 mg total) by mouth daily.   Cholecalciferol (VITAMIN D) 50 MCG (2000 UT) CAPS Take 2,000 Units by mouth in the morning.   donepezil (ARICEPT) 10 MG tablet Take half a tablet in the morning for a month and then increase to 1 full tablet daily.   finasteride (PROSCAR) 5 MG tablet Take 5 mg by mouth daily.   ipratropium (ATROVENT) 0.06 % nasal spray Place 2 sprays into the nose as needed for rhinitis.   levETIRAcetam (KEPPRA) 500 MG tablet Take 3 tablets by mouth in the morning and at bedtime.   loperamide (IMODIUM) 2 MG capsule Take 1 capsule (2 mg total) by mouth as needed for diarrhea or loose stools.   LORazepam (ATIVAN) 1 MG tablet Take 1 mg by mouth every 6 (six) hours as needed for seizure.    metoprolol succinate (TOPROL-XL) 25 MG 24 hr tablet TAKE 1 TABLET (25 MG TOTAL) BY MOUTH DAILY.   Midazolam (NAYZILAM) 5 MG/0.1ML SOLN Place 1 each into the nose as needed (seizure).   midodrine (PROAMATINE) 2.5 MG tablet Take 1 tablet (2.5 mg total) by mouth daily.   mupirocin ointment (BACTROBAN) 2 % Apply 1 Application topically daily.   nitroGLYCERIN (NITROSTAT) 0.4 MG SL tablet PLACE 1 TABLET (0.4 MG TOTAL) UNDER THE TONGUE EVERY 5 (FIVE) MINUTES AS NEEDED FOR CHEST PAIN.   nystatin ointment (MYCOSTATIN) Apply 1 Application topically 2 (two) times daily.   Propylene Glycol (SYSTANE BALANCE) 0.6 % SOLN Apply 1 drop to eye daily as needed (dry eyes).   pyridOXINE (VITAMIN B-6) 100 MG tablet  Take 100 mg by mouth in the morning.   rosuvastatin (CRESTOR) 5 MG tablet Take 1 tablet (5 mg total) by mouth 3 (three) times a week.   Tamsulosin HCl (FLOMAX) 0.4 MG CAPS Take 0.4 mg by mouth every evening.   thiamine 100 MG tablet Take 100 mg by mouth in the morning.   torsemide (DEMADEX) 20 MG tablet Take 1 tablet (20 mg) by mouth alternating with 1/2 tablet (10 mg) by mouth every other day.   triamcinolone cream (KENALOG) 0.5 % Apply 1 Application topically as needed (break out prevention).   VIMPAT 200 MG TABS tablet Take 200 mg by mouth 2 (two) times daily.   vitamin B-12 (CYANOCOBALAMIN) 1000 MCG tablet Take 1,000 mcg by mouth in the morning.    Physical Exam:    VS:  BP 130/78   Pulse 66   Ht 5\' 8"  (1.727 m)   Wt 241 lb 9.6 oz (109.6 kg)   SpO2 95%   BMI 36.74 kg/m    Wt Readings from Last 3 Encounters:  04/12/23 241  lb 9.6 oz (109.6 kg)  03/20/23 244 lb (110.7 kg)  01/24/23 246 lb 6.4 oz (111.8 kg)    GEN: Well nourished, well developed in no acute distress NECK: No JVD; No carotid bruits CARDIAC: RRR, no murmurs, rubs, gallops RESPIRATORY:  Clear to auscultation without rales, wheezing or rhonchi  ABDOMEN: Soft, non-tender, non-distended EXTREMITIES:  No edema; No acute deformity   Asessement and Plan:.    1. Chronic combined HF, presumed NICM with orthostasis - stable without accelerating symptoms. Continue torsemide at present dose. His edema looks so much better compared to when he was on furosemide. Not on ACEI/ARB/ARNI due to h/o orthostasis. EF remained normal by echo earlier this year. Can continue midodrine PRN though has not had to take this recently.   2. Minimal CAD with chronic chest pain - stable, no indication for repeat testing at this time. Not on ASA due to h/o SDH. Continue rosuvastatin. LDL At goal.  3. PVCs - quiescent by symptoms. Continue metoprolol. The last PCP refill indicated 25mg  once daily and he was previously on 12.5mg . They will check at  home what he is taking and let us know. Would anticipate continuing whatever he has been taking most recently, just need to update med list to reflect this.  4. Moderate AI, mild MR, mild dilation of aorta - follow clinically and consider f/u echo in 09/2023, can be arranged closer to that time. We discussed that I cannot find a reason for why he would need SBE ppx from a cardiac standpoint but recommended he discuss with his dentist in case something else in his medical history warrants this (VP shunt, ?ortho history). He is pending some upcoming dental work, possibly some bone grafting. If we get a clearance request for this, he is OK to proceed from my standpoint without further CV testing. Likewise if he requires endoscopic procedures for GI issues (not yet requested), would be OK to proceed - do not think additional testing would change medical risk at this time.  5. Obesity - family inquires about GLP-1, suggested by neuro. Will refer to pharmD to evaluate candidacy. Weight loss would help with his chronic dyspnea.     Disposition: F/u with me in January. Next EP due 12/2023, can schedule closer to that time.  Signed, Laurann Montana, PA-C

## 2023-04-12 ENCOUNTER — Encounter: Payer: Self-pay | Admitting: Physician Assistant

## 2023-04-12 ENCOUNTER — Ambulatory Visit: Payer: Medicare Other | Attending: Physician Assistant | Admitting: Physician Assistant

## 2023-04-12 ENCOUNTER — Ambulatory Visit: Payer: Medicare Other

## 2023-04-12 ENCOUNTER — Ambulatory Visit: Payer: Medicare Other | Admitting: Occupational Therapy

## 2023-04-12 ENCOUNTER — Ambulatory Visit: Payer: Medicare Other | Attending: Internal Medicine

## 2023-04-12 VITALS — BP 130/78 | HR 66 | Ht 68.0 in | Wt 241.6 lb

## 2023-04-12 DIAGNOSIS — I69918 Other symptoms and signs involving cognitive functions following unspecified cerebrovascular disease: Secondary | ICD-10-CM | POA: Diagnosis not present

## 2023-04-12 DIAGNOSIS — M6281 Muscle weakness (generalized): Secondary | ICD-10-CM

## 2023-04-12 DIAGNOSIS — R293 Abnormal posture: Secondary | ICD-10-CM | POA: Insufficient documentation

## 2023-04-12 DIAGNOSIS — R1310 Dysphagia, unspecified: Secondary | ICD-10-CM

## 2023-04-12 DIAGNOSIS — R471 Dysarthria and anarthria: Secondary | ICD-10-CM

## 2023-04-12 DIAGNOSIS — I351 Nonrheumatic aortic (valve) insufficiency: Secondary | ICD-10-CM

## 2023-04-12 DIAGNOSIS — R2689 Other abnormalities of gait and mobility: Secondary | ICD-10-CM | POA: Insufficient documentation

## 2023-04-12 DIAGNOSIS — R41840 Attention and concentration deficit: Secondary | ICD-10-CM | POA: Diagnosis not present

## 2023-04-12 DIAGNOSIS — I77819 Aortic ectasia, unspecified site: Secondary | ICD-10-CM | POA: Diagnosis not present

## 2023-04-12 DIAGNOSIS — I5042 Chronic combined systolic (congestive) and diastolic (congestive) heart failure: Secondary | ICD-10-CM | POA: Diagnosis not present

## 2023-04-12 DIAGNOSIS — I2510 Atherosclerotic heart disease of native coronary artery without angina pectoris: Secondary | ICD-10-CM

## 2023-04-12 DIAGNOSIS — E66812 Obesity, class 2: Secondary | ICD-10-CM

## 2023-04-12 DIAGNOSIS — I340 Nonrheumatic mitral (valve) insufficiency: Secondary | ICD-10-CM

## 2023-04-12 DIAGNOSIS — I493 Ventricular premature depolarization: Secondary | ICD-10-CM

## 2023-04-12 DIAGNOSIS — R2681 Unsteadiness on feet: Secondary | ICD-10-CM | POA: Insufficient documentation

## 2023-04-12 NOTE — Patient Instructions (Signed)
   SWALLOWING EXERCISES Do these for 6-8 weeks, then 2 times per week afterwards You can use a wet spoon to swallow something if your mouth gets dry   [employer] - "squeeze swallow" exercise - Swallow, and squeeze tight to keep your Adam's Apple up - Hold the squeeze for 5-7 seconds - then relax - Do at least 20 reps/day, in sets of 5-10       2  "Super Swallow"  - Take a breath and hold it  - Swallow then IMMEDIATELY cough  - Do at least 20 reps/day, in sets of 5-10  3.  Pitch Raise - Say "he", start at a low note and glide as high you can  - Do at least 20 reps/day, in sets of 5-10

## 2023-04-12 NOTE — Patient Instructions (Addendum)
Medication Instructions:  Your physician recommends that you continue on your current medications as directed. Please refer to the Current Medication list given to you today.  *If you need a refill on your cardiac medications before your next appointment, please call your pharmacy*   Lab Work: None ordered  If you have labs (blood work) drawn today and your tests are completely normal, you will receive your results only by: MyChart Message (if you have MyChart) OR A paper copy in the mail If you have any lab test that is abnormal or we need to change your treatment, we will call you to review the results.   Testing/Procedures: None ordered   You have been referred to Pharmacist to discuss GLP-1 injection medications. This is a special class of medicines that helps to reduce a patient's weight. They are a type of injection that you would take at home. They will go over the different options with you at your visit, depending on your medical history and your insurance.    Follow-Up: At [facility], you and your health needs are our priority.  As part of our continuing mission to provide you with exceptional heart care, we have created designated Provider Care Teams.  These Care Teams include your primary Cardiologist (physician) and Advanced Practice Providers (APPs -  Physician Assistants and Nurse Practitioners) who all work together to provide you with the care you need, when you need it.  We recommend signing up for the patient portal called "MyChart".  Sign up information is provided on this After Visit Summary.  MyChart is used to connect with patients for Virtual Visits (Telemedicine).  Patients are able to view lab/test results, encounter notes, upcoming appointments, etc.  Non-urgent messages can be sent to your provider as well.   To learn more about what you can do with MyChart, go to [email or web address].    Your next appointment:   3 month(s)  Provider:   Ronie Spies, PA-C         Other Instructions  Call me with your Metoprolol dose

## 2023-04-12 NOTE — Therapy (Signed)
OUTPATIENT OCCUPATIONAL THERAPY NEURO  Treatment Note  Patient Name: Jeff Weaver MRN: [id number] DOB:[birth year], 77 y.o., male Today's Date: 04/12/2023  PCP: Pincus Sanes, MD REFERRING PROVIDER: Pincus Sanes, MD  END OF SESSION:  OT End of Session - 04/12/23 1546     Visit Number 6    Number of Visits 17    Date for OT Re-Evaluation 05/12/23    Authorization Type Medicare A&B    OT Start Time 1534    OT Stop Time 1616    OT Time Calculation (min) 42 min                  Past Medical History:  Diagnosis Date   Aortic insufficiency    Atypical chest pain    Cardiac resynchronization therapy pacemaker (CRT-P) in place    Cerebral aneurysm    a. s/p clipping at [facility] in 8/13 c/b short-term memory loss, cerebral hemorrhage and seizure disorder s/p VP shunt.   Cerebral hemorrhage (HCC)    Chronic combined systolic and diastolic CHF (congestive heart failure) (HCC)    CVA (cerebral infarction)    Diverticulosis    Enteritis (regional)    Dr Juanda Chance   First degree AV block    GI bleed    HTN (hypertension)    Hyperlipidemia    Hypertensive heart disease    IBS (irritable bowel syndrome)    Lewy body dementia (HCC)    Mild CAD    Cardiac cath 01/12/18 showed minimal nonobstructive CAD, normal LVEF, normal LVEDP.   Mild dilation of ascending aorta (HCC)    Mitral regurgitation    NSVT (nonsustained ventricular tachycardia) (HCC)    Orthostatic hypotension    Pacemaker    Pre-diabetes    PVC's (premature ventricular contractions)    Regional enteritis of large intestine (HCC) since 1978   Rheumatoid arthritis(714.0)    dxed in [employer] in 1980s   Second degree AV block, Mobitz type I    Seizures (HCC)    last April 26,2021   Sinus bradycardia    Sleep apnea    SSS (sick sinus syndrome) (HCC)    Subdural hematoma (HCC)    Past Surgical History:  Procedure Laterality Date   BIV PACEMAKER INSERTION CRT-P N/A 03/10/2021   Procedure: BIV PACEMAKER INSERTION  CRT-P UPGRADE;  Surgeon: Regan Lemming, MD;  Location: [facility] INVASIVE CV LAB;  Service: Cardiovascular;  Laterality: N/A;   CHOLECYSTECTOMY N/A 01/07/2016   Procedure: LAPAROSCOPIC CHOLECYSTECTOMY ;  Surgeon: Abigail Miyamoto, MD;  Location: [facility] OR;  Service: General;  Laterality: N/A;   cns shunt  02/23/12   COLONOSCOPY W/ POLYPECTOMY  1978   negative 2009, Dr Juanda Chance. Due 2014   CRANIOTOMY  02/02/12   Dr Kelby Aline, [facility]-clipping of aneurysm   CRANIOTOMY  02-02-12   left pterional craniotomy for clipping complex anterior communicating artery aneurysm    HERNIA REPAIR     LEFT HEART CATH AND CORONARY ANGIOGRAPHY N/A 01/12/2018   Procedure: LEFT HEART CATH AND CORONARY ANGIOGRAPHY;  Surgeon: Swazil[place], Peter M, MD;  Location: [facility] INVASIVE CV LAB;  Service: Cardiovascular;  Laterality: N/A;   LEFT HEART CATHETERIZATION WITH CORONARY ANGIOGRAM N/A 11/26/2013   Procedure: LEFT HEART CATHETERIZATION WITH CORONARY ANGIOGRAM;  Surgeon: Marykay Lex, MD;  Location: [facility] CATH LAB;  Service: Cardiovascular;  Laterality: N/A;   NOSE SURGERY     PACEMAKER IMPLANT N/A 07/02/2018   Procedure: PACEMAKER IMPLANT;  Surgeon: Regan Lemming, MD;  Location: [facility] INVASIVE  CV LAB;  Service: Cardiovascular;  Laterality: N/A;   SHOULDER SURGERY  1997   TONSILLECTOMY AND ADENOIDECTOMY     VENTRICULOPERITONEAL SHUNT  02-23-12   INSERTION OF RIGHT FRONTAL VENTRICULOPERITONEAL SHUNT WITH A CODMAN HAKIM PROGRAMMABLE VALVE   Patient Active Problem List   Diagnosis Date Noted   Benign paroxysmal positional vertigo of right ear 03/03/2023   COVID-19 03/03/2023   Abrasion, forearm with infection, left, initial encounter 03/03/2023   Physical deconditioning 12/20/2022   Poor balance 12/20/2022   PVC's (premature ventricular contractions) 09/19/2022   Diplopia 09/19/2022   Parkinsonism (HCC) 06/19/2022   Primary localized osteoarthrosis of shoulder region 05/10/2022   Recurrent dislocation of shoulder joint 05/10/2022    Regional enteritis (HCC) 05/10/2022   Vestibular hypofunction of left ear 05/10/2022   Sensorineural hearing loss 05/10/2022   Senile osteoporosis 02/03/2022   Pain in right hip 01/05/2022   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   Intertrigo 10/01/2021   HFrEF (heart failure with reduced ejection fraction) (HCC) 09/18/2021   Blepharitis of upper and lower eyelids of both eyes 09/18/2021   Tinea cruris 09/18/2021   Lewy body dementia (HCC) 09/16/2021   Closed fracture of right hip (HCC) 09/16/2021   S/P hip replacement, right  07/2021 09/16/2021   Presence of right artificial hip joint 08/06/2021   Vascular dementia (HCC) 04/29/2021   Multiple falls 04/29/2021   Cough 04/09/2021   Vitamin D deficiency 04/09/2021   Vitamin B1 deficiency 04/09/2021   Right arm pain 01/06/2021   Seizures (HCC)    Muscle cramping 12/30/2019   Dizziness 12/30/2019   Unsteady gait 12/30/2019   Pacemaker 11/27/2019   Fatigue 07/09/2019   SSS (sick sinus syndrome) (HCC) 07/02/2018   Bilateral shoulder pain 06/14/2018   Upper airway cough syndrome vs cough variant asthma 03/20/2018   Acute midline low back pain without sciatica 12/25/2017   Depression 10/17/2017   CAD (coronary artery disease), hx of NSTEMI 08/15/2017   Chest pain 07/17/2017   SOBOE (shortness of breath on exertion) 06/08/2017   Bilateral sensorineural hearing loss 06/01/2017   Nasal turbinate hypertrophy 06/01/2017   Dysphagia 05/31/2017   Osteoporosis 01/30/2017   History of cerebral hemorrhage 12/28/2016   Seborrheic dermatitis 07/25/2016   Cognitive communication deficit 06/28/2016   Hypertensive heart disease 06/28/2016   Vitamin B 12 deficiency 03/16/2016   Confusion 03/13/2016   VP (ventriculoperitoneal) shunt status 02/12/2016   Orthostatic hypotension 01/05/2016   Benign prostatic hyperplasia with urinary obstruction 08/04/2014   Urinary urgency 08/04/2014   Gastroesophageal reflux disease 07/14/2014   Polymyalgia rheumatica  (HCC) 05/28/2014   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/2014   SBE (subacute bacterial endocarditis) prophylaxis candidate 11/24/2013   Aphasia 07/17/2013   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 seizures, not intractable, without status epilepticus (HCC) 07/17/2013   Subdural hemorrhage (HCC) 07/17/2013   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HCC) 07/17/2013   Pre-diabetes 03/26/2013   Urinary frequency 10/31/2012   Nocturia 10/31/2012   History of cerebral aneurysm repair 10/18/2012   History of stroke without residual deficits 10/18/2012   Ocular rosacea 09/27/2012   Seizure disorder (HCC) 07/23/2012   Entropion 07/10/2012   Hyperopia with astigmatism 07/10/2012   Combined senile cataract 04/13/2012   Presbyopia 04/13/2012   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) 03/01/2012   Stroke (HCC) 03/01/2012   Cognitive safety issue 02/20/2012   Nonruptured cerebral aneurysm 11/23/2011   Obstructive sleep apnea syndrome 08/09/2011   Hyperlipidemia 02/03/2009   HTN (hypertension) 02/03/2009   History  of Crohn's disease 04/17/2008   Allergic rhinitis, unspecified 07/05/1999   Cataract, right eye 07/05/1999    ONSET DATE: referral date 02/21/23  REFERRING DIAG: R41.841 (ICD-10-CM) - Cognitive communication deficit G31.83,F02.80 (ICD-10-CM) - Lewy body dementia, unspecified dementia severity, unspecified whether behavioral, psychotic, or mood disturbance or anxiety (HCC) I63.512 (ICD-10-CM) - 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  Pt's spouse reports that MD is trialing glasses with prism but is unsure whether it is working appropriately.   Pt accompanied by: self and family member (spouse)  PERTINENT HISTORY: Aortic insufficiency, CRT-P pacemaker placement, cerebral aneurysm s/p clipping, cerebral hemorrhage and seizure disorder s/p VP shunt, CHF, CVA, second degree AV block Mobitz type 1 s/p PPM, HTN, HLD, hypertensive heart disease, IBS, lew body dementia, mitral regurgitation, NSVT, orthostatic hypotension, RA, SSS, subdural hematoma, hernia repair, cardiac cath  PRECAUTIONS: Fall, ICD/Pacemaker, and Other: poor vision, STM impairments  WEIGHT BEARING RESTRICTIONS: No  PAIN:  Are you having pain? Yes: NPRS scale: 3/10 Pain location: B knees R > L Pain description: aching, discomfort Aggravating factors: prolonged sitting Relieving factors: movement  FALLS: Has patient fallen in last 6 months? Yes. Number of falls 2-3, getting in/out of bed or when turning to pick something up   LIVING ENVIRONMENT: Lives with: lives with their family (daughter comes and goes, will spend a few days per week in the home with them). Lives in: House/apartment Stairs: Yes: Internal: 14 steps; and External: 1 step to enter home  Has following equipment at home: Dan Humphreys - 2 wheeled, Environmental consultant - 4 wheeled, Tour manager, and Grab bars  PLOF: Independent and Independent with basic ADLs  PATIENT GOALS: improve endurance and engagement in ADLs  OBJECTIVE:   HAND DOMINANCE: Right  ADLs: Transfers/ambulation related to ADLs: Mod I to supervision with RW Eating: spouse typically cuts foods for him, pt will lean towards food instead of bringing food to mouth, spoon is preferred utensil Grooming: increased time UB Dressing: increased time, is ableto manage buttons LB Dressing: wears disposable underwear and pull on pants for increased  ease Toileting: Mod I, does have a grab bar in front that he will pull up on; difficulty with hygiene post BM Bathing: spouse assists with bathing, requires encouragement to bathe Tub Shower transfers: spouse assists with transfers Equipment: Transfer tub bench and Grab bars  IADLs: Medication management: spouse fills pill box and will remind him to take  MOBILITY STATUS: Needs Assist: Mod I to Supervision with RW and Hx of falls; required min assist to stand from chair without arm rests  POSTURE COMMENTS:  rounded shoulders, forward head, decreased lumbar lordosis, increased thoracic kyphosis, and right head tilt  ACTIVITY TOLERANCE: Activity tolerance: diminished  UPPER EXTREMITY ROM:    Active ROM Right eval Left eval  Shoulder flexion 90 110  Shoulder abduction    Shoulder adduction    Shoulder extension    Shoulder internal rotation 80% 70%  Shoulder external rotation 80% 80%  Elbow flexion    Elbow extension    Wrist flexion    Wrist  extension    Wrist ulnar deviation    Wrist radial deviation    Wrist pronation    Wrist supination    (Blank rows = not tested)  HAND FUNCTION: 03/29/23 Grip strength: Right: 45, 40, 36 lbs; Left: 41, 36, 33 lbs  COORDINATION: 03/22/23 9 Hole Peg test: Right: 53.19 sec; Left: 1:17.28 sec Box and Blocks:  Right 26 blocks, Left 23 blocks  COGNITION: Overall cognitive status: History of cognitive impairments - at baseline Brief Interview for Mental Status:   Memory:  Word Repetition:   "Sock"  "Blue"  "Bed"   Orientation Level:    Year:   2024  Month: Sept  Day of Week: Weds  Memory:  Recall:     Unable to recall "Sock"  with cue   Able to recall  "Blue"  without cue   Able to recall  "Bed"   with cue     No Recall   VISION: Subjective report: diplopia.  Recently went to ophthalmologist who applied a Fresnel prism 8 base in on the right lens of his readers. Baseline vision: Wears glasses for reading only  VISION  ASSESSMENT: Impaired To be further assessed in functional context   TODAY'S TREATMENT:                                                                        DATE: 04/12/23 Reiterated vision with use of prism glasses.  OT encouraged pt placing items in same place to facilitate increased carryover/use of glasses as well as consistent placement of items as memory compensatory strategy.   Bag exercises: Simulated ADLS for the following activities (using a plastic bag): donning shirt, drying back, pulling down shirt and donning pants.  OT providing verbal cues and demonstration for technique.  Pt demonstrating good amplitude with arm extension in front and when reaching behind.  OT providing increased verbal cues and demonstration when tossing bag over same shoulder and then reaching behind at lower back to grasp bag as if drying back.  Pt with extreme difficulty with simulated LB dressing, with difficulty lifting leg enough and bending forward enough to advance bag under foot.  LB dressing: engaged in attempts at figure 4 positioning with pt able to achieve only by pulling on pants leg which would not work when getting dressed as no pant to pull on.  Plan to continue to problem solve safe methods to aid in LB dressing.    04/03/23 Hand Gripper: with LUE then RUE on level 35# with yellow spring. Pt picked up 1 inch blocks with gripper with 2/10 drops with LUE and min difficulty and no drops with RUE.  Increased resistance on R to 50#.  Pt dropping 25% of blocks and reaching farther across midline.  Therefore OT repositioned items to midline with pt demonstrating improved success to completing 3/4 of remaining blocks without dropping. Coordination/vision: engaged in stacking of 1" blocks with focus on coordination, sustained attention, and visual attention to task.  Pt reports diplopia impacting success, therefore providing cues for positioning of items, use of glasses with Fresnel lenses, and other  compensatory strategies to improve vision. Theraband: reviewed BUE strengthening with red theraband with OT providing min multimodal cues for sequencing and proper technique.  Pt completing 1 set of horizontal abduction with cues for symmetry and 1 set of 10 shoulder diagonals bilaterally.  OT providing demonstration and cues for "thumb up" position to increase ROM during diagonals.   03/29/23 Grip strength: Assessed grip strength (see above for measurements).  Pt reports difficulty with opening containers and sustained grasp with clothing management.  OT educated on adaptive equipment to aid in opening containers.  Pt trialing grip pad as well as jar opener for increased grasp when opening tight jar.  Pt to take grip pad home to continue to use, wife taking note of adaptive jar opener as option to purchase as needed. Theraband: engaged in BUE strengthening with red theraband with OT providing mod multimodal cues for sequencing and proper technique.  Pt completing 1 set of horizontal abduction with cues for symmetry and 1 set of 10 shoulder diagonals bilaterally.  OT providing demonstration and cues for "thumb up" position to increase ROM.   PATIENT EDUCATION: Education details: ongoing condition specific education Person educated: Patient, Spouse, and Child(ren) Education method: Explanation Education comprehension: verbalized understanding and needs further education  HOME EXERCISE PROGRAM: Access Code: [email or web address] URL: [email or web address] Date: 03/29/2023 Prepared by: [facility] Outpatient  Rehab - [facility]  Exercises - Seated Shoulder Horizontal Abduction with Resistance  - 2 x daily - 10 reps - 3-5 sec hold - Seated Shoulder Diagonal with Resistance  - 2 x daily - 10 reps - 3-5 sec hold   GOALS: Goals reviewed with patient? Yes  SHORT TERM GOALS: Target date: 04/14/23  Pt and caregiver will be independent with gentle ROM HEP for improved functional use of  BUE. Baseline: Goal status: IN PROGRESS  2.  Pt will verbalize understanding of task modifications and/or potential AE needs to increase ease, safety, and independence w/ ADLs Baseline:  Goal status: IN PROGRESS  3.  Pt will verbalize understanding of use of new glasses with Fresnel lenses for increased visual perception as needed for ADLs and IADLs. Baseline:  Goal status: IN PROGRESS   LONG TERM GOALS: Target date: 05/12/23  Pt will demonstrate and/or report ability to complete LB dressing to include footwear at Mod I level utilizing adaptive techniques and/or AE PRN. Baseline:  Goal status: IN PROGRESS  2.  Pt will be able to complete toileting hygiene at Mod I level with adaptive techniques and/or AE PRN. Baseline:  Goal status: IN PROGRESS  3.  Pt will demonstrate improved internal rotation as needed for clothing management and hygiene post toileting. Baseline:  Goal status: IN PROGRESS  4.  Pt will demonstrate improved sustained attention to task with ability to complete table top task for 10 mins with 1 or fewer cues for attention to task. Baseline:  Goal status: IN PROGRESS  ASSESSMENT:  CLINICAL IMPRESSION: Pt continues to require increased time for sequencing and following of directions due to verbosity and requiring increased time for processing.  Pt demonstrating good technique with bag exercises with opening/full arm extension when reaching infront and when reaching behind back.  Pt benefits from massed practice and demonstration for increased understanding and carryover.  Pt continues to demonstrate and report difficulty with reaching towards feet as needed for LB dressing.  PERFORMANCE DEFICITS: in functional skills including ADLs, IADLs, ROM, pain, flexibility, Gross motor control, balance, body mechanics, endurance, and decreased knowledge of use of DME, cognitive skills including attention, memory, problem solving, and safety awareness, and psychosocial skills  including coping strategies, environmental adaptation, and habits.  PLAN:  OT FREQUENCY: 2x/week  OT DURATION: 8 weeks  PLANNED INTERVENTIONS: self care/ADL training, therapeutic exercise, therapeutic activity, neuromuscular re-education, passive range of motion, balance training, functional mobility training, ultrasound, compression bandaging, moist heat, cryotherapy, patient/family education, cognitive remediation/compensation, visual/perceptual remediation/compensation, psychosocial skills training, energy conservation, coping strategies training, and DME and/or AE instructions  RECOMMENDED OTHER SERVICES: NA  CONSULTED AND AGREED WITH PLAN OF CARE: Patient and family member/caregiver  PLAN FOR NEXT SESSION: adaptive techniques for LB dressing, review bag exercises, sit > stand, grip and UB strengthening   Jadakiss Barish, OTR/L 04/12/2023, 4:22 PM

## 2023-04-12 NOTE — Therapy (Signed)
OUTPATIENT PHYSICAL THERAPY NEURO TREATMENT   Patient Name: Jeff Weaver MRN: [id number] DOB:11-28-1945, 77 y.o., male Today's Date: 04/12/2023   PCP: Pincus Sanes, MD REFERRING PROVIDER: Pincus Sanes, MD  END OF SESSION:  PT End of Session - 04/12/23 1620     Visit Number 9    Number of Visits 25    Date for PT Re-Evaluation 05/15/23    Authorization Type MCR/[employer]    Authorization Time Period 02/20/23 to 05/15/23    Progress Note Due on Visit 10    PT Start Time 1615    PT Stop Time 1700    PT Time Calculation (min) 45 min    Equipment Utilized During Treatment Gait belt    Activity Tolerance Patient tolerated treatment well    Behavior During Therapy [facility] for tasks assessed/performed                 Past Medical History:  Diagnosis Date   Aortic insufficiency    Atypical chest pain    Cardiac resynchronization therapy pacemaker (CRT-P) in place    Cerebral aneurysm    a. s/p clipping at [facility] in 8/13 c/b short-term memory loss, cerebral hemorrhage and seizure disorder s/p VP shunt.   Cerebral hemorrhage (HCC)    Chronic combined systolic and diastolic CHF (congestive heart failure) (HCC)    CVA (cerebral infarction)    Diverticulosis    Enteritis (regional)    Dr Juanda Chance   First degree AV block    GI bleed    HTN (hypertension)    Hyperlipidemia    Hypertensive heart disease    IBS (irritable bowel syndrome)    Lewy body dementia (HCC)    Mild CAD    Cardiac cath 01/12/18 showed minimal nonobstructive CAD, normal LVEF, normal LVEDP.   Mild dilation of ascending aorta (HCC)    Mitral regurgitation    NSVT (nonsustained ventricular tachycardia) (HCC)    Orthostatic hypotension    Pacemaker    Pre-diabetes    PVC's (premature ventricular contractions)    Regional enteritis of large intestine (HCC) since 1978   Rheumatoid arthritis(714.0)    dxed in [employer] in 1980s   Second degree AV block, Mobitz type I    Seizures (HCC)    last April 26,2021    Sinus bradycardia    Sleep apnea    SSS (sick sinus syndrome) (HCC)    Subdural hematoma (HCC)    Past Surgical History:  Procedure Laterality Date   BIV PACEMAKER INSERTION CRT-P N/A 03/10/2021   Procedure: BIV PACEMAKER INSERTION CRT-P UPGRADE;  Surgeon: Regan Lemming, MD;  Location: [facility] INVASIVE CV LAB;  Service: Cardiovascular;  Laterality: N/A;   CHOLECYSTECTOMY N/A 01/07/2016   Procedure: LAPAROSCOPIC CHOLECYSTECTOMY ;  Surgeon: Abigail Miyamoto, MD;  Location: [facility] OR;  Service: General;  Laterality: N/A;   cns shunt  02/23/12   COLONOSCOPY W/ POLYPECTOMY  1978   negative 2009, Dr Juanda Chance. Due 2014   CRANIOTOMY  02/02/12   Dr Kelby Aline, [facility]-clipping of aneurysm   CRANIOTOMY  02-02-12   left pterional craniotomy for clipping complex anterior communicating artery aneurysm    HERNIA REPAIR     LEFT HEART CATH AND CORONARY ANGIOGRAPHY N/A 01/12/2018   Procedure: LEFT HEART CATH AND CORONARY ANGIOGRAPHY;  Surgeon: Swaziland, Peter M, MD;  Location: [facility] INVASIVE CV LAB;  Service: Cardiovascular;  Laterality: N/A;   LEFT HEART CATHETERIZATION WITH CORONARY ANGIOGRAM N/A 11/26/2013   Procedure: LEFT HEART CATHETERIZATION WITH CORONARY ANGIOGRAM;  Surgeon: Marykay Lex, MD;  Location: [facility] CATH LAB;  Service: Cardiovascular;  Laterality: N/A;   NOSE SURGERY     PACEMAKER IMPLANT N/A 07/02/2018   Procedure: PACEMAKER IMPLANT;  Surgeon: Regan Lemming, MD;  Location: [facility] INVASIVE CV LAB;  Service: Cardiovascular;  Laterality: N/A;   SHOULDER SURGERY  1997   TONSILLECTOMY AND ADENOIDECTOMY     VENTRICULOPERITONEAL SHUNT  02-23-12   INSERTION OF RIGHT FRONTAL VENTRICULOPERITONEAL SHUNT WITH A CODMAN HAKIM PROGRAMMABLE VALVE   Patient Active Problem List   Diagnosis Date Noted   Benign paroxysmal positional vertigo of right ear 03/03/2023   COVID-19 03/03/2023   Abrasion, forearm with infection, left, initial encounter 03/03/2023   Physical deconditioning 12/20/2022   Poor balance 12/20/2022    PVC's (premature ventricular contractions) 09/19/2022   Diplopia 09/19/2022   Parkinsonism (HCC) 06/19/2022   Primary localized osteoarthrosis of shoulder region 05/10/2022   Recurrent dislocation of shoulder joint 05/10/2022   Regional enteritis (HCC) 05/10/2022   Vestibular hypofunction of left ear 05/10/2022   Sensorineural hearing loss 05/10/2022   Senile osteoporosis 02/03/2022   Pain in right hip 01/05/2022   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   Intertrigo 10/01/2021   HFrEF (heart failure with reduced ejection fraction) (HCC) 09/18/2021   Blepharitis of upper and lower eyelids of both eyes 09/18/2021   Tinea cruris 09/18/2021   Lewy body dementia (HCC) 09/16/2021   Closed fracture of right hip (HCC) 09/16/2021   S/P hip replacement, right  07/2021 09/16/2021   Presence of right artificial hip joint 08/06/2021   Vascular dementia (HCC) 04/29/2021   Multiple falls 04/29/2021   Cough 04/09/2021   Vitamin D deficiency 04/09/2021   Vitamin B1 deficiency 04/09/2021   Right arm pain 01/06/2021   Seizures (HCC)    Muscle cramping 12/30/2019   Dizziness 12/30/2019   Unsteady gait 12/30/2019   Pacemaker 11/27/2019   Fatigue 07/09/2019   SSS (sick sinus syndrome) (HCC) 07/02/2018   Bilateral shoulder pain 06/14/2018   Upper airway cough syndrome vs cough variant asthma 03/20/2018   Acute midline low back pain without sciatica 12/25/2017   Depression 10/17/2017   CAD (coronary artery disease), hx of NSTEMI 08/15/2017   Chest pain 07/17/2017   SOBOE (shortness of breath on exertion) 06/08/2017   Bilateral sensorineural hearing loss 06/01/2017   Nasal turbinate hypertrophy 06/01/2017   Dysphagia 05/31/2017   Osteoporosis 01/30/2017   History of cerebral hemorrhage 12/28/2016   Seborrheic dermatitis 07/25/2016   Cognitive communication deficit 06/28/2016   Hypertensive heart disease 06/28/2016   Vitamin B 12 deficiency 03/16/2016   Confusion 03/13/2016   VP  (ventriculoperitoneal) shunt status 02/12/2016   Orthostatic hypotension 01/05/2016   Benign prostatic hyperplasia with urinary obstruction 08/04/2014   Urinary urgency 08/04/2014   Gastroesophageal reflux disease 07/14/2014   Polymyalgia rheumatica (HCC) 05/28/2014   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/2014   SBE (subacute bacterial endocarditis) prophylaxis candidate 11/24/2013   Aphasia 07/17/2013   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 seizures, not intractable, without status epilepticus (HCC) 07/17/2013   Subdural hemorrhage (HCC) 07/17/2013   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HCC) 07/17/2013   Pre-diabetes 03/26/2013   Urinary frequency 10/31/2012   Nocturia 10/31/2012   History of cerebral aneurysm repair 10/18/2012   History of stroke without residual deficits 10/18/2012   Ocular rosacea 09/27/2012   Seizure disorder (HCC) 07/23/2012   Entropion 07/10/2012   Hyperopia with astigmatism 07/10/2012   Combined senile cataract 04/13/2012   Presbyopia 04/13/2012   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) 03/01/2012   Stroke (HCC) 03/01/2012   Cognitive safety issue 02/20/2012   Nonruptured cerebral aneurysm 11/23/2011   Obstructive sleep apnea syndrome 08/09/2011   Hyperlipidemia 02/03/2009   HTN (hypertension) 02/03/2009   History of Crohn's disease 04/17/2008   Allergic rhinitis, unspecified 07/05/1999   Cataract, right eye 07/05/1999    ONSET DATE: chronic   REFERRING DIAG: R26.89 (ICD-10-CM) - Poor balance R53.81 (ICD-10-CM) - Physical deconditioning  THERAPY DIAG:  Muscle weakness (generalized)  Other symptoms and signs involving cognitive functions following unspecified cerebrovascular disease  Unsteadiness on feet  Other abnormalities of gait and mobility  Abnormal posture  Rationale for Evaluation and Treatment: Rehabilitation  SUBJECTIVE:                                                                                                                                                                                              SUBJECTIVE STATEMENT: "Feeling ok, have had worse days. Right side of neck is sore"   Pt accompanied by: significant other  PERTINENT HISTORY: Aortic insufficiency, CRT-P pacemaker placement, cerebral aneurysm s/p clipping, cerebral hemorrhage and seizure disorder s/p VP shunt, CHF, CVA, second degree AV block Mobitz type 1 s/p PPM, HTN, HLD, hypertensive heart disease, IBS, lew body dementia, mitral regurgitation, NSVT, orthostatic hypotension, RA, SSS, subdural hematoma, hernia repair, cardiac cath  PAIN:  Are you having pain? Yes: NPRS scale: 8-9/10 Pain location: right side of neck Pain description: sore, like a cramp Aggravating factors: movement, neck extension Relieving factors: rest  PRECAUTIONS: Fall and ICD/Pacemaker, poor vision, STM impairments  RED FLAGS: None   WEIGHT BEARING RESTRICTIONS: No  FALLS: Has patient fallen in last 6 months? Yes. Number of falls 2 in the past 6 months, had additional falls >6 months ago; per spouse, falls a lot when bending to pick something up off the floor, fall with SDH and hip fracture in 2023 was due to slipping on wet grass outside   LIVING ENVIRONMENT: Lives with: lives with their family Lives in: House/apartment Stairs: Yes: Internal: 14 steps; one step to enter home  Has following equipment at home: Single point cane and "semi-hospital bed that adjusts the head, walker with 2 wheels, 4WW and WC, shower bench"   PLOF: Independent, Independent with basic ADLs, Requires assistive device for independence, and handheld shower head; per spouse "house is older and difficult to renovate, may try to convert bathroom to a shower stall  PATIENT GOALS: improve strength, balance, endurance   OBJECTIVE:   TODAY'S TREATMENT: 04/12/23 Activity Comments  March in place x 60 sec In // bars  Sidestepping x 2 min In // bars  TUG test 48  sec  Manual therapy -soft tissue mobilization R>L lateral cervical column to scalenes, upper trap, levator, and SCM to address pain, spasm, limited cervical ROM. Suboccipital release. Manual cervical traction  Supine cervical extension isometric 1x10 2 sec hold, tactile cues for facilitation/sequence        TODAY'S TREATMENT: 04/03/23 Activity Comments  NU-step speed intervals x 6 min 2 min warm-up 30 sec speed; 30 sec slow --needs assist for speed intervals approx 50% assist to maintain 80+ SPM  Dynamic balance in // bars -forward/retrowalk x 2 min -sidestepping x 2 min  Pre-gait -techniques for step height/length 2x10 over hurdles -forwards/backwards over 6" hurdles for step advancement   Standing on foam -EO x 60 sec -EC x 6 trials  Gait training Techniques to improve step height length with visual/verbal cues          PATIENT EDUCATION: (SELF CARE) Education details: rationale of balance exercises Person educated: Patient and Spouse Education method: Explanation, Demonstration, Tactile cues, Verbal cues, and Handouts Education comprehension: verbalized understanding and returned demonstration   HOME EXERCISE PROGRAM: Access Code: [email or web address] URL: [email or web address] Date: 03/13/2023 Prepared by: [facility] - Outpatient  Rehab - [facility]  Program Notes walking program: practice walking with the walker for 5 minutes inside the house every hour   Exercises - Seated March with Resistance  - 1 x daily - 5 x weekly - 2 sets - 10 reps - Seated Long Arc Quad  - 1 x daily - 5 x weekly - 3 sets - 10 reps - Seated Ankle Dorsiflexion AROM  - 1 x daily - 7 x weekly - 3 sets - 10 reps - 3 sec hold - Seated Heel Raise  - 1 x daily - 5 x weekly - 2 sets - 10 reps - Cervical Extension AROM with Strap  - 1 x daily - 7 x weekly - 3 sets - 10 reps - Side to Side Weight Shift with Overhead Reach and Counter Support  - 1 x daily - 7 x weekly - 3 sets - 10 reps -  Standing Gastroc Stretch at Counter  - 1 x daily - 7 x weekly - 3 sets - 30-60 sec hold - Supine Isometric Neck Extension  - 1 x daily - 7 x weekly - 3 sets - 10 reps - 2 sec hold  Added: sitting PWR! Moves handout  Use of boomwhackers/paper towel roll with twist and step 10x each    ----------------------------------------------------- Objective measures below taken at initial evaluation:  COGNITION: Overall cognitive status: History of cognitive impairments - at baseline       POSTURE: rounded shoulders, forward head, decreased lumbar lordosis, and increased thoracic kyphosis, flexed at hips     LOWER EXTREMITY MMT:    MMT Right Eval Left Eval  Hip flexion 3 3  Hip extension    Hip abduction 3 seated  3 seated  Hip adduction    Hip internal rotation    Hip external rotation    Knee flexion    Knee extension 4 4  Ankle dorsiflexion 3+ 3+  Ankle plantarflexion    Ankle inversion    Ankle eversion    (Blank rows = not tested)    TRANSFERS: Assistive device utilized: Single point cane  Sit to stand: SBA Stand to sit: SBA Chair to chair: SBA      GAIT: Gait pattern: step through pattern, decreased arm swing- Right, decreased  arm swing- Left, decreased step length- Right, decreased step length- Left, decreased stance time- Right, decreased stance time- Left, decreased stride length, decreased ankle dorsiflexion- Right, decreased ankle dorsiflexion- Left, Right foot flat, Left foot flat, shuffling, trendelenburg, decreased trunk rotation, trunk flexed, and wide BOS Distance walked: in clinic distances  Assistive device utilized: Single point cane Level of assistance: SBA Comments: slow and steady with SPC   FUNCTIONAL TESTS:  5 times sit to stand: 58 seconds use of UEs on chair  Timed up and go (TUG): 44 seconds, SPC  Gait speed 0.14 m/s with SPC   PATIENT SURVEYS:  FOTO next visit       GOALS: Goals reviewed with patient? Yes  SHORT TERM GOALS:  Target date: 04/03/2023    Will be compliant with appropriate progressive HEP with no more than MinA from family  Baseline: Goal status: MET  2.  Will improve gait speed to at least 0.11m/s with LRAD  Baseline: 0.3 w/ RW Goal status: MET  3.  Will complete TUG test in 30 seconds or better with LRAD  Baseline: 48 sec Goal status: IN PROGRESS  4.  Will complete 5xSTS test in 45 seconds or less with UEs  Baseline: 60 sec Goal status: NOT MET  5.  Will tolerate walking and/or activity in PT for at least 10 minutes without needing a rest break  Baseline:  Goal status: MET    LONG TERM GOALS: Target date: 05/15/2023    MMT to improve by one grade in all weak groups  Baseline:  Goal status: IN PROGRESS  2.  Will be able to ambulate at least 350ft in (0.36m/s) with LRAD to show improved community access no rest breaks   Baseline:  Goal status: IN PROGRESS  3.  Will score at least 42 on the Berg to show reduced fall risk  Baseline:  Goal status: IN PROGRESS  4.  Will complete 5xSTS test in 20 seconds or less with UEs to show improved functional mobility  Baseline:  Goal status: IN PROGRESS  5.  Will be compliant with appropriate progressive long term exercise plan with no more than MinA from family to combat sedentary lifestyle and maintain functional gains from PT  Baseline:  Goal status: IN PROGRESS    ASSESSMENT:  CLINICAL IMPRESSION: Dynamic standing activities to initiate session to improve mobility with emphasis on large amplitude step movements. TUG test performed with RW and slow, but steady pace without freezing of gait during turns requiring 48 sec to complete indicating high risk for falls. Pt notes increased right neck pain as of late and has been presenting with head down posture for some time.  Palpation reveals very tight and restricted right scalenes, SCM, and upper trap with very limited left lateral flexion and left rotation  as compared to the right  due to tightness restriction on right side of neck.  Tolerated soft tissue mob very well with report of pain from 9/10 at outset to 6/10 end of session. Instructed in supine next extension isometric with head in neutral by lying supine or instructed in reclining in chair at home to press occiput for extension engagement. Continued sessions to progress POC details.  OBJECTIVE IMPAIRMENTS: Abnormal gait, decreased activity tolerance, decreased balance, decreased cognition, decreased coordination, decreased knowledge of use of DME, decreased mobility, difficulty walking, decreased strength, decreased safety awareness, postural dysfunction, and obesity.   ACTIVITY LIMITATIONS: lifting, bending, standing, squatting, stairs, transfers, bed mobility, and locomotion level  PARTICIPATION LIMITATIONS:  meal prep, cleaning, shopping, community activity, yard work, and church  PERSONAL FACTORS: Age, Fitness, Past/current experiences, Social background, and Time since onset of injury/illness/exacerbation are also affecting patient's functional outcome.   REHAB POTENTIAL: Good  CLINICAL DECISION MAKING: Evolving/moderate complexity  EVALUATION COMPLEXITY: Moderate  PLAN:  PT FREQUENCY: 2x/week  PT DURATION: 12 weeks  PLANNED INTERVENTIONS: Therapeutic exercises, Therapeutic activity, Neuromuscular re-education, Balance training, Gait training, Patient/Family education, Self Care, Stair training, DME instructions, Aquatic Therapy, Dry Needling, Cognitive remediation, Cryotherapy, Moist heat, Manual therapy, and Re-evaluation  PLAN FOR NEXT SESSION: HEP review  4:21 PM, 04/12/23 M. Shary Decamp, PT, DPT Physical Therapist- [place] Office Number: [phone]

## 2023-04-12 NOTE — Therapy (Signed)
OUTPATIENT SPEECH LANGUAGE PATHOLOGY TREATMENT   Patient Name: Jeff Weaver MRN: [id number] DOB:[birth year], 77 y.o., male Today's Date: 04/12/2023  PCP: Cheryll Cockayne, MD REFERRING PROVIDER: Same as PCP  END OF SESSION:  End of Session - 04/12/23 1656     Visit Number 6    Number of Visits 25    Date for SLP Re-Evaluation 06/13/23    SLP Start Time 1450    SLP Stop Time  1530    SLP Time Calculation (min) 40 min    Activity Tolerance Patient tolerated treatment well               Past Medical History:  Diagnosis Date   Aortic insufficiency    Atypical chest pain    Cardiac resynchronization therapy pacemaker (CRT-P) in place    Cerebral aneurysm    a. s/p clipping at [facility] in 8/13 c/b short-term memory loss, cerebral hemorrhage and seizure disorder s/p VP shunt.   Cerebral hemorrhage (HCC)    Chronic combined systolic and diastolic CHF (congestive heart failure) (HCC)    CVA (cerebral infarction)    Diverticulosis    Enteritis (regional)    Dr Juanda Chance   First degree AV block    GI bleed    HTN (hypertension)    Hyperlipidemia    Hypertensive heart disease    IBS (irritable bowel syndrome)    Lewy body dementia (HCC)    Mild CAD    Cardiac cath 01/12/18 showed minimal nonobstructive CAD, normal LVEF, normal LVEDP.   Mild dilation of ascending aorta (HCC)    Mitral regurgitation    NSVT (nonsustained ventricular tachycardia) (HCC)    Orthostatic hypotension    Pacemaker    Pre-diabetes    PVC's (premature ventricular contractions)    Regional enteritis of large intestine (HCC) since 1978   Rheumatoid arthritis(714.0)    dxed in [employer] in 1980s   Second degree AV block, Mobitz type I    Seizures (HCC)    last April 26,2021   Sinus bradycardia    Sleep apnea    SSS (sick sinus syndrome) (HCC)    Subdural hematoma (HCC)    Past Surgical History:  Procedure Laterality Date   BIV PACEMAKER INSERTION CRT-P N/A 03/10/2021   Procedure: BIV PACEMAKER INSERTION  CRT-P UPGRADE;  Surgeon: Regan Lemming, MD;  Location: [facility] INVASIVE CV LAB;  Service: Cardiovascular;  Laterality: N/A;   CHOLECYSTECTOMY N/A 01/07/2016   Procedure: LAPAROSCOPIC CHOLECYSTECTOMY ;  Surgeon: Abigail Miyamoto, MD;  Location: [facility] OR;  Service: General;  Laterality: N/A;   cns shunt  02/23/12   COLONOSCOPY W/ POLYPECTOMY  1978   negative 2009, Dr Juanda Chance. Due 2014   CRANIOTOMY  02/02/12   Dr Kelby Aline, [facility]-clipping of aneurysm   CRANIOTOMY  02-02-12   left pterional craniotomy for clipping complex anterior communicating artery aneurysm    HERNIA REPAIR     LEFT HEART CATH AND CORONARY ANGIOGRAPHY N/A 01/12/2018   Procedure: LEFT HEART CATH AND CORONARY ANGIOGRAPHY;  Surgeon: Swaziland, Peter M, MD;  Location[facility] INVASIVE CV LAB;  Service: Cardiovascular;  Laterality: N/A;   LEFT HEART CATHETERIZATION WITH CORONARY ANGIOGRAM N/A 11/26/2013   Procedure: LEFT HEART CATHETERIZATION WITH CORONARY ANGIOGRAM;  Surgeon: Marykay Lex, MD;  Location: [facility] CATH LAB;  Service: Cardiovascular;  Laterality: N/A;   NOSE SURGERY     PACEMAKER IMPLANT N/A 07/02/2018   Procedure: PACEMAKER IMPLANT;  Surgeon: Regan Lemming, MD;  Location: [facility] INVASIVE CV LAB;  Service:  Cardiovascular;  Laterality: N/A;   SHOULDER SURGERY  1997   TONSILLECTOMY AND ADENOIDECTOMY     VENTRICULOPERITONEAL SHUNT  02-23-12   INSERTION OF RIGHT FRONTAL VENTRICULOPERITONEAL SHUNT WITH A CODMAN HAKIM PROGRAMMABLE VALVE   Patient Active Problem List   Diagnosis Date Noted   Benign paroxysmal positional vertigo of right ear 03/03/2023   COVID-19 03/03/2023   Abrasion, forearm with infection, left, initial encounter 03/03/2023   Physical deconditioning 12/20/2022   Poor balance 12/20/2022   PVC's (premature ventricular contractions) 09/19/2022   Diplopia 09/19/2022   Parkinsonism (HCC) 06/19/2022   Primary localized osteoarthrosis of shoulder region 05/10/2022   Recurrent dislocation of shoulder joint 05/10/2022    Regional enteritis (HCC) 05/10/2022   Vestibular hypofunction of left ear 05/10/2022   Sensorineural hearing loss 05/10/2022   Senile osteoporosis 02/03/2022   Pain in right hip 01/05/2022   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   Intertrigo 10/01/2021   HFrEF (heart failure with reduced ejection fraction) (HCC) 09/18/2021   Blepharitis of upper and lower eyelids of both eyes 09/18/2021   Tinea cruris 09/18/2021   Lewy body dementia (HCC) 09/16/2021   Closed fracture of right hip (HCC) 09/16/2021   S/P hip replacement, right  07/2021 09/16/2021   Presence of right artificial hip joint 08/06/2021   Vascular dementia (HCC) 04/29/2021   Multiple falls 04/29/2021   Cough 04/09/2021   Vitamin D deficiency 04/09/2021   Vitamin B1 deficiency 04/09/2021   Right arm pain 01/06/2021   Seizures (HCC)    Muscle cramping 12/30/2019   Dizziness 12/30/2019   Unsteady gait 12/30/2019   Pacemaker 11/27/2019   Fatigue 07/09/2019   SSS (sick sinus syndrome) (HCC) 07/02/2018   Bilateral shoulder pain 06/14/2018   Upper airway cough syndrome vs cough variant asthma 03/20/2018   Acute midline low back pain without sciatica 12/25/2017   Depression 10/17/2017   CAD (coronary artery disease), hx of NSTEMI 08/15/2017   Chest pain 07/17/2017   SOBOE (shortness of breath on exertion) 06/08/2017   Bilateral sensorineural hearing loss 06/01/2017   Nasal turbinate hypertrophy 06/01/2017   Dysphagia 05/31/2017   Osteoporosis 01/30/2017   History of cerebral hemorrhage 12/28/2016   Seborrheic dermatitis 07/25/2016   Cognitive communication deficit 06/28/2016   Hypertensive heart disease 06/28/2016   Vitamin B 12 deficiency 03/16/2016   Confusion 03/13/2016   VP (ventriculoperitoneal) shunt status 02/12/2016   Orthostatic hypotension 01/05/2016   Benign prostatic hyperplasia with urinary obstruction 08/04/2014   Urinary urgency 08/04/2014   Gastroesophageal reflux disease 07/14/2014   Polymyalgia rheumatica  (HCC) 05/28/2014   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/2014   SBE (subacute bacterial endocarditis) prophylaxis candidate 11/24/2013   Aphasia 07/17/2013   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 seizures, not intractable, without status epilepticus (HCC) 07/17/2013   Subdural hemorrhage (HCC) 07/17/2013   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HCC) 07/17/2013   Pre-diabetes 03/26/2013   Urinary frequency 10/31/2012   Nocturia 10/31/2012   History of cerebral aneurysm repair 10/18/2012   History of stroke without residual deficits 10/18/2012   Ocular rosacea 09/27/2012   Seizure disorder (HCC) 07/23/2012   Entropion 07/10/2012   Hyperopia with astigmatism 07/10/2012   Combined senile cataract 04/13/2012   Presbyopia 04/13/2012   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) 03/01/2012   Stroke (HCC) 03/01/2012   Cognitive safety issue 02/20/2012   Nonruptured cerebral aneurysm 11/23/2011   Obstructive sleep apnea syndrome 08/09/2011   Hyperlipidemia 02/03/2009   HTN (hypertension) 02/03/2009   History of Crohn's disease 04/17/2008  Allergic rhinitis, unspecified 07/05/1999   Cataract, right eye 07/05/1999    ONSET DATE: chronic disease- script dated 02/21/23  REFERRING DIAG: R41.841 (ICD-10-CM) - Cognitive communication deficit G31.83,F02.80 (ICD-10-CM) - Lewy body dementia, unspecified dementia severity, unspecified whether behavioral, psychotic, or mood disturbance or anxiety (HCC) I63.512 (ICD-10-CM) - 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  PAIN:  Are you having pain?  Yes: NPRS scale: 6/10 Pain location: bil knees  FALLS: Has patient fallen in last 6 months?  See PT evaluation for details   PATIENT GOALS: Increase speech clarity, assistance with food clearance  OBJECTIVE:  MBS - 04/07/23 Clinical Impression: Swallowing continues to be notable for mistiming of the pharyngeal swallow with thin, nectar, and honey-thick liquids entering the laryngeal vestibule prior to its closure. This led to intermittent, trace aspiration of all tested liquids. Aspiration was generally silent, excluding one occasion of coughing. There was limited laryngeal elevation or anterior movement, but base-of-tongue retraction and pharyngeal stripping helped close the epiglottis over larynx and contributed to clearance of boluses through pharynx. Curvature of cervical vertebrae contributed to risk of aspiration. Chair was reclined in an effort to facilitate travel of liquids through posterior pharynx; this position may have helped reduce quantity of aspiration, though it was difficult to quantify improvement. Pt viewed the video in real time and results were discussed with Lennox Grumbles and Mrs. Toni Arthurs afterwards. Pt's swallowing has been compromised at least since aspiration was documented in April's MBS. He has thus far not developed pneumonia. We discussed the importance of activity/mobility as well as pursuing further therapy to minimize the impact of aspiration. Pt should continue regular solids/thin liquids; meds whole in puree; sips of liquid should be taken one at a time. Recommend focus of tx on laryngeal mobility and timing of pharyngeal swallow. Pt will follow up with Verdie Mosher, OP SLP   MBS [facility] April 2024: Patient with mild oropharyngeal dysphagia, like secondary to age-related changes to the swallowing mechanism and generalized deconditioning. Dysphagia is primarily characterized by mistiming of the pharyngeal swallow and reduced supraglottic closure, resulting in penetration  of thin liquids during the swallow with intermittent silent aspiration on progression of cord-level residual. Cued post-swallow throat clearing was effective at mitigating penetrants. PAS scores below. Bolus propulsion and UES distension is largely intact. Based on today's examination, recommend patient continue regular solids/thin liquids, pills one at a time, with adherence to aspiration precautions and swallowing strategies including alternation of liquids and solids, SINGLE sips at a time, and regular post-swallow throat clearing after the swallow. Provided risks and adverse outcomes associated with dysphagia and aspiration (i.e possible life-threatening pneumonia) and rationale for adherence to management recommendations. Did provide signs of worsening dysphagia and return to clinic criteria. Will re-evaluate in 6 months, certainly sooner if indicated. Patient and family verbalized understanding of all exam results and recommendations.  Recommendations: Diet: Regular Aspiration precautions:  sit upright during all PO (as close to 90 degrees as possible) alternate bites/sips cough/clear throat regularly during the meal SINGLE sips  Medications: one at a time   PATIENT REPORTED OUTCOME MEASURES (PROM): Communication Effectiveness Survey: forgot on 03/29/23; will bring next session  TODAY'S TREATMENT:  DATE:  04/12/23: SLP to ask for pt's CES next session. Given pt's MBS on Friday, SLP reviewed this with pt/wife. SLP initiated a HEP of Mendelsohn, supraglottic swallow, and "ee" glide. Pt req'd usual min-mod A initially, faded to rare min A. Marijean Niemann was asked by SLP if she was comfortable assisting pt with HEP and she answered affirmatively. SLP will shape pt's pitch glide next session as production was too harsh; SLP wanted to reinforce the gliding nature of the exercise  at this time. Pt's wife and SLP agreed it would be challenging to have pt recline for meals and since quantifiable difference could  not be made between reclined and not reclined SLP and wife agreed pt should remain upright for meals.   04/03/23: Pt and Jamie agree that pt is talking louder at home. Throughout session, pt did in fact have louder, WNL volume for much of today's session in conversation, with average overall today 68dB (WNL 70-72dB). In sentence level responses pt's loudness was WNL, even with mod complex responses. Pt to cont to practice everyday sentences at home. Today, pt recited everyday sentences initially with average 73 dB, and with min cues for ensuring pt shouting/using considerable effort with everyday sentences he improved loudness to 83dB average.   03/29/23: Pt completed conversation in the ST room with [facility] loudness; rare min cues for loudness with monologues, but pt successfully incr'd loudness each time. Asher Muir will bring CES next session. Finnlee recited his everyday sentences with initial cues for loudness. He indicated again that he did not want to shout, and SLP again reminded him that everyday sentences should be shouting, but this will not carry to conversation. Educated again about rationale for everyday sentences (muscle memory for conversation). Because pt's memory skills are reduced, affecting pt's ability to recall that he needs to speak louder, SLP also educated wife and pt about effective communication techniques. According to Asher Muir she/they have already worked to find and implement some of these techniques already as pt's various challenges with communication began in 2013.  03/27/23: MBS scheduled for 04/07/23. SLP cued pt if he was having difficulty with drooling to swallow first and then wipe excess saliva. SLP provided a CES today to [place] and asked her to complete and return next session. SLP worked with pt's loudness today, first in everyday sentences pt with average  83 dB; in sentence completion tasks averaged 74dB with min A <10% for increasing loudness. In simiarity/difference task, pt's average loudness was 72dB however pt's responses did not match stimulus 25% of the time and SLP had to provide cues for pt to correctly answer, sometimes using max cues. Suspect fatigue played a role with this, as ST was pt's last therapy today.   03/22/23: Pt needs CES next session.  SLP introduced pt and wife to everyday sentences. Pt thought of 5 and wife generated 4. SLP instructed pt to recite these twice a day in a loud, strong voice, and explained rationale for pt and wife. Pt questioned rationale and SLP drew a diagram with WNL volume and pt's conversational volume and reiterated when he talks loudly it brings his volume into/closer to WNL range. Kaveh spoke in a louder volume for 20 seconds of a 35-second monologue about a general in the [employer] after this, and answered affirmatively when asked if he was trying to use a louder voice. SLP encouraged him that this was WNL volume for a portion of his last conversational turn.  03/15/23 (eval): n/a  PATIENT EDUCATION: Education  details: See "today's treatment" for details Person educated: Patient and Spouse Education method: Explanation, Demonstration, Verbal cues, and Handouts Education comprehension: verbalized understanding and needs further education  HOME EXERCISE PROGRAM: Swallow HEP as above   GOALS: Goals reviewed with patient? Yes (in general)  SHORT TERM GOALS: Target date: 04/14/23  Pt will complete MBS to assess current swallowing status and assess safe swallow strategies Baseline: Goal status: Met  2.  Pt will maintain average mid-upper 80s for loud /a/ in 3 sessions Baseline:  Goal status: INITIAL  3.  Pt will use speech volume average upper 60s dB for 5 minutes simple conversation in two sessions Baseline:  Goal status: INITIAL  4.  pt will generate abdominal breathing at rest (seated position)  80% of the time over 2 sessions Baseline:  Goal status: INITIAL   LONG TERM GOALS: Target date: 05/12/23  Pt will improve PROM measure scores in the last 1-2 sessions Baseline:  Goal status: INITIAL  2.  Pt will maintain average upper 80s for loud /a/ in 3 sessions Baseline:  Goal status: INITIAL  3.  Pt will use speech volume average low 70s dB for 8 minutes simple-mod complex  conversation with occasional verbal cues in two sessions Baseline:  Goal status: INITIAL  4.   Pt will generate abdominal breathing 60% of the time in 8 minutes simple-mod complex conversation over 2 sessions Baseline:  Goal status: INITIAL  5.  Pt will complete swallowing HEP with occasional min A in 3 sessions  Baseline:  Goal status: INITIAL   ASSESSMENT:  CLINICAL IMPRESSION: Patient is a 77 y.o. M who was seen today for treatment of swallowing in light of Parkinsonism and other complex medical hx. Goals for swallowing were added to LTGs following MBS on 04/07/23 (results above in "diagnostic findings"). See "today's treatment" for more details. "They are always telling me to speak up," pt stated to SLP on eval date. Pt's OT to target compensations for cognition.  OBJECTIVE IMPAIRMENTS: Objective impairments include memory, dysarthria, and dysphagia. These impairments are limiting patient from ADLs/IADLs, effectively communicating at home and in community, and safety when swallowing.Factors affecting potential to achieve goals and functional outcome are ability to learn/carryover information and co-morbidities.. Patient will benefit from skilled SLP services to address above impairments and improve overall function.  REHAB POTENTIAL: Good  PLAN:  SLP FREQUENCY: 2x/week  SLP DURATION: 8 weeks  PLANNED INTERVENTIONS: Aspiration precaution training, Pharyngeal strengthening exercises, Diet toleration management , Environmental controls, Trials of upgraded texture/liquids, Internal/external aids,  Oral motor exercises, Functional tasks, SLP instruction and feedback, Compensatory strategies, and Patient/family education    [facility], CCC-SLP 04/12/2023, 4:56 PM

## 2023-04-12 NOTE — Patient Instructions (Signed)
Bag Exercises:  Small trash bag or produce bag works best.  For all exercises, sit with big posture (sit up tall with head up) and use big movements. Perform the following exercises 1 times per day.  Hold bag in one hand. Stretch both arms/hands out to the side as big as you can. Then, pass bag from one hand to the other IN FRONT of you. Stretch arms back out big after each pass. Repeat 10 times. Hold bag in one hand. Stretch both arms/hands out to the side as big as you can. Then, pass bag from one hand to the other BEHIND you. Stretch arms back out big after each pass. Repeat 10 times. Hold bag in right hand. Move right hand to reach behind shoulder. Then, reach behind back with left hand to pass bag from right hand to left hand. Switch sides. Repeat 5-10 times on each side. Hold bag in both hands in front of you with hands/arms shoulder length apart. Move bag behind your head. Repeat 10 times.  Challenge: Hold bag in both hands in front of you with hands/arms shoulder length apart. Lift leg and move bag completely under each foot and back. Repeat 5 times on each side.

## 2023-04-14 ENCOUNTER — Telehealth: Payer: Self-pay | Admitting: Physician Assistant

## 2023-04-14 ENCOUNTER — Ambulatory Visit (HOSPITAL_COMMUNITY)
Admission: RE | Admit: 2023-04-14 | Discharge: 2023-04-14 | Disposition: A | Discharge status: Home / Self Care | Payer: Medicare Other | Source: Ambulatory Visit | Attending: Internal Medicine | Admitting: Internal Medicine

## 2023-04-14 ENCOUNTER — Encounter (HOSPITAL_COMMUNITY): Payer: Self-pay

## 2023-04-14 DIAGNOSIS — R159 Full incontinence of feces: Secondary | ICD-10-CM | POA: Diagnosis not present

## 2023-04-14 DIAGNOSIS — R197 Diarrhea, unspecified: Secondary | ICD-10-CM | POA: Diagnosis not present

## 2023-04-14 DIAGNOSIS — N281 Cyst of kidney, acquired: Secondary | ICD-10-CM | POA: Diagnosis not present

## 2023-04-14 DIAGNOSIS — R140 Abdominal distension (gaseous): Secondary | ICD-10-CM | POA: Insufficient documentation

## 2023-04-14 DIAGNOSIS — K5090 Crohn's disease, unspecified, without complications: Secondary | ICD-10-CM | POA: Diagnosis not present

## 2023-04-14 DIAGNOSIS — K5730 Diverticulosis of large intestine without perforation or abscess without bleeding: Secondary | ICD-10-CM | POA: Diagnosis not present

## 2023-04-14 DIAGNOSIS — K5010 Crohn's disease of large intestine without complications: Secondary | ICD-10-CM | POA: Insufficient documentation

## 2023-04-14 MED ORDER — SODIUM CHLORIDE (PF) 0.9 % IJ SOLN
INTRAMUSCULAR | Status: AC
  Filled 2023-04-14: qty 50

## 2023-04-14 MED ORDER — IOHEXOL 9 MG/ML PO SOLN
500.0000 mL | ORAL | Status: AC
  Administered 2023-04-14 (×2): 500 mL via ORAL

## 2023-04-14 MED ORDER — IOHEXOL 9 MG/ML PO SOLN
ORAL | Status: AC
  Filled 2023-04-14: qty 1000

## 2023-04-14 MED ORDER — IOHEXOL 300 MG/ML  SOLN
100.0000 mL | Freq: Once | INTRAMUSCULAR | Status: AC | PRN
  Administered 2023-04-14: 100 mL via INTRAVENOUS

## 2023-04-14 NOTE — Telephone Encounter (Signed)
Pt seen with family on Wed 10/9. Wife Asher Muir helps with care as patient has dementia. They were going to check his metoprolol dose and let us know what he is taking. If taking 25mg  once daily, leave rx alone but if taking 12.5mg  daily OK to update his prescription on file to reflect this and continue.  Please have them confirm from home med bottles he is not on amoxicillin and remove from med list if so.  Please also change midodrine rx to 2.5mg  daily PRN going forward since he is only taking this as needed. If they have this at home already, can add a note to the pharmacy not to fill yet again until family requests.  Thank you!

## 2023-04-14 NOTE — Telephone Encounter (Signed)
Left message to call office

## 2023-04-18 ENCOUNTER — Ambulatory Visit: Payer: Medicare Other

## 2023-04-18 ENCOUNTER — Ambulatory Visit: Payer: Medicare Other | Admitting: Occupational Therapy

## 2023-04-19 ENCOUNTER — Ambulatory Visit: Payer: Medicare Other | Admitting: Occupational Therapy

## 2023-04-19 ENCOUNTER — Ambulatory Visit: Payer: Medicare Other

## 2023-04-19 DIAGNOSIS — R2681 Unsteadiness on feet: Secondary | ICD-10-CM | POA: Diagnosis not present

## 2023-04-19 DIAGNOSIS — R1310 Dysphagia, unspecified: Secondary | ICD-10-CM

## 2023-04-19 DIAGNOSIS — R293 Abnormal posture: Secondary | ICD-10-CM | POA: Diagnosis not present

## 2023-04-19 DIAGNOSIS — R2689 Other abnormalities of gait and mobility: Secondary | ICD-10-CM | POA: Diagnosis not present

## 2023-04-19 DIAGNOSIS — R471 Dysarthria and anarthria: Secondary | ICD-10-CM

## 2023-04-19 DIAGNOSIS — M6281 Muscle weakness (generalized): Secondary | ICD-10-CM

## 2023-04-19 DIAGNOSIS — I69918 Other symptoms and signs involving cognitive functions following unspecified cerebrovascular disease: Secondary | ICD-10-CM | POA: Diagnosis not present

## 2023-04-19 DIAGNOSIS — R41840 Attention and concentration deficit: Secondary | ICD-10-CM

## 2023-04-19 NOTE — Therapy (Signed)
OUTPATIENT OCCUPATIONAL THERAPY NEURO  Treatment Note  Patient Name: Jeff Weaver MRN: [id number] DOB:09/03/1945, 77 y.o., male Today's Date: 04/19/2023  PCP: Pincus Sanes, MD REFERRING PROVIDER: Pincus Sanes, MD  END OF SESSION:  OT End of Session - 04/19/23 1508     Visit Number 7    Number of Visits 17    Date for OT Re-Evaluation 05/12/23    Authorization Type Medicare A&B    OT Start Time 1451    OT Stop Time 1531    OT Time Calculation (min) 40 min                   Past Medical History:  Diagnosis Date   Aortic insufficiency    Atypical chest pain    Cardiac resynchronization therapy pacemaker (CRT-P) in place    Cerebral aneurysm    a. s/p clipping at [facility] in 8/13 c/b short-term memory loss, cerebral hemorrhage and seizure disorder s/p VP shunt.   Cerebral hemorrhage (HCC)    Chronic combined systolic and diastolic CHF (congestive heart failure) (HCC)    CVA (cerebral infarction)    Diverticulosis    Enteritis (regional)    Dr Juanda Chance   First degree AV block    GI bleed    HTN (hypertension)    Hyperlipidemia    Hypertensive heart disease    IBS (irritable bowel syndrome)    Lewy body dementia (HCC)    Mild CAD    Cardiac cath 01/12/18 showed minimal nonobstructive CAD, normal LVEF, normal LVEDP.   Mild dilation of ascending aorta (HCC)    Mitral regurgitation    NSVT (nonsustained ventricular tachycardia) (HCC)    Orthostatic hypotension    Pacemaker    Pre-diabetes    PVC's (premature ventricular contractions)    Regional enteritis of large intestine (HCC) since 1978   Rheumatoid arthritis(714.0)    dxed in [employer] in 1980s   Second degree AV block, Mobitz type I    Seizures (HCC)    last April 26,2021   Sinus bradycardia    Sleep apnea    SSS (sick sinus syndrome) (HCC)    Subdural hematoma (HCC)    Past Surgical History:  Procedure Laterality Date   BIV PACEMAKER INSERTION CRT-P N/A 03/10/2021   Procedure: BIV PACEMAKER  INSERTION CRT-P UPGRADE;  Surgeon: Regan Lemming, MD;  Location: [facility] INVASIVE CV LAB;  Service: Cardiovascular;  Laterality: N/A;   CHOLECYSTECTOMY N/A 01/07/2016   Procedure: LAPAROSCOPIC CHOLECYSTECTOMY ;  Surgeon: Abigail Miyamoto, MD;  Location: [facility] OR;  Service: General;  Laterality: N/A;   cns shunt  02/23/12   COLONOSCOPY W/ POLYPECTOMY  1978   negative 2009, Dr Juanda Chance. Due 2014   CRANIOTOMY  02/02/12   Dr Kelby Aline, [facility]-clipping of aneurysm   CRANIOTOMY  02-02-12   left pterional craniotomy for clipping complex anterior communicating artery aneurysm    HERNIA REPAIR     LEFT HEART CATH AND CORONARY ANGIOGRAPHY N/A 01/12/2018   Procedure: LEFT HEART CATH AND CORONARY ANGIOGRAPHY;  Surgeon: Swazil[place], Peter M, MD;  Location: [facility] INVASIVE CV LAB;  Service: Cardiovascular;  Laterality: N/A;   LEFT HEART CATHETERIZATION WITH CORONARY ANGIOGRAM N/A 11/26/2013   Procedure: LEFT HEART CATHETERIZATION WITH CORONARY ANGIOGRAM;  Surgeon: Marykay Lex, MD;  Location: [facility] CATH LAB;  Service: Cardiovascular;  Laterality: N/A;   NOSE SURGERY     PACEMAKER IMPLANT N/A 07/02/2018   Procedure: PACEMAKER IMPLANT;  Surgeon: Regan Lemming, MD;  Location: [facility]  INVASIVE CV LAB;  Service: Cardiovascular;  Laterality: N/A;   SHOULDER SURGERY  1997   TONSILLECTOMY AND ADENOIDECTOMY     VENTRICULOPERITONEAL SHUNT  02-23-12   INSERTION OF RIGHT FRONTAL VENTRICULOPERITONEAL SHUNT WITH A CODMAN HAKIM PROGRAMMABLE VALVE   Patient Active Problem List   Diagnosis Date Noted   Benign paroxysmal positional vertigo of right ear 03/03/2023   COVID-19 03/03/2023   Abrasion, forearm with infection, left, initial encounter 03/03/2023   Physical deconditioning 12/20/2022   Poor balance 12/20/2022   PVC's (premature ventricular contractions) 09/19/2022   Diplopia 09/19/2022   Parkinsonism (HCC) 06/19/2022   Primary localized osteoarthrosis of shoulder region 05/10/2022   Recurrent dislocation of shoulder joint  05/10/2022   Regional enteritis (HCC) 05/10/2022   Vestibular hypofunction of left ear 05/10/2022   Sensorineural hearing loss 05/10/2022   Senile osteoporosis 02/03/2022   Pain in right hip 01/05/2022   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   Intertrigo 10/01/2021   HFrEF (heart failure with reduced ejection fraction) (HCC) 09/18/2021   Blepharitis of upper and lower eyelids of both eyes 09/18/2021   Tinea cruris 09/18/2021   Lewy body dementia (HCC) 09/16/2021   Closed fracture of right hip (HCC) 09/16/2021   S/P hip replacement, right  07/2021 09/16/2021   Presence of right artificial hip joint 08/06/2021   Vascular dementia (HCC) 04/29/2021   Multiple falls 04/29/2021   Cough 04/09/2021   Vitamin D deficiency 04/09/2021   Vitamin B1 deficiency 04/09/2021   Right arm pain 01/06/2021   Seizures (HCC)    Muscle cramping 12/30/2019   Dizziness 12/30/2019   Unsteady gait 12/30/2019   Pacemaker 11/27/2019   Fatigue 07/09/2019   SSS (sick sinus syndrome) (HCC) 07/02/2018   Bilateral shoulder pain 06/14/2018   Upper airway cough syndrome vs cough variant asthma 03/20/2018   Acute midline low back pain without sciatica 12/25/2017   Depression 10/17/2017   CAD (coronary artery disease), hx of NSTEMI 08/15/2017   Chest pain 07/17/2017   SOBOE (shortness of breath on exertion) 06/08/2017   Bilateral sensorineural hearing loss 06/01/2017   Nasal turbinate hypertrophy 06/01/2017   Dysphagia 05/31/2017   Osteoporosis 01/30/2017   History of cerebral hemorrhage 12/28/2016   Seborrheic dermatitis 07/25/2016   Cognitive communication deficit 06/28/2016   Hypertensive heart disease 06/28/2016   Vitamin B 12 deficiency 03/16/2016   Confusion 03/13/2016   VP (ventriculoperitoneal) shunt status 02/12/2016   Orthostatic hypotension 01/05/2016   Benign prostatic hyperplasia with urinary obstruction 08/04/2014   Urinary urgency 08/04/2014   Gastroesophageal reflux disease 07/14/2014    Polymyalgia rheumatica (HCC) 05/28/2014   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/2014   SBE (subacute bacterial endocarditis) prophylaxis candidate 11/24/2013   Aphasia 07/17/2013   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 seizures, not intractable, without status epilepticus (HCC) 07/17/2013   Subdural hemorrhage (HCC) 07/17/2013   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HCC) 07/17/2013   Pre-diabetes 03/26/2013   Urinary frequency 10/31/2012   Nocturia 10/31/2012   History of cerebral aneurysm repair 10/18/2012   History of stroke without residual deficits 10/18/2012   Ocular rosacea 09/27/2012   Seizure disorder (HCC) 07/23/2012   Entropion 07/10/2012   Hyperopia with astigmatism 07/10/2012   Combined senile cataract 04/13/2012   Presbyopia 04/13/2012   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) 03/01/2012   Stroke (HCC) 03/01/2012   Cognitive safety issue 02/20/2012   Nonruptured cerebral aneurysm 11/23/2011   Obstructive sleep apnea syndrome 08/09/2011   Hyperlipidemia 02/03/2009   HTN (hypertension) 02/03/2009  History of Crohn's disease 04/17/2008   Allergic rhinitis, unspecified 07/05/1999   Cataract, right eye 07/05/1999    ONSET DATE: referral date 02/21/23  REFERRING DIAG: R41.841 (ICD-10-CM) - Cognitive communication deficit G31.83,F02.80 (ICD-10-CM) - Lewy body dementia, unspecified dementia severity, unspecified whether behavioral, psychotic, or mood disturbance or anxiety (HCC) I63.512 (ICD-10-CM) - 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   Pt accompanied by: self and family member (spouse)  PERTINENT HISTORY: Aortic insufficiency, CRT-P pacemaker placement, cerebral aneurysm s/p clipping, cerebral hemorrhage and seizure disorder s/p VP shunt, CHF, CVA, second degree AV block Mobitz type 1 s/p PPM, HTN, HLD, hypertensive heart disease, IBS, lew body dementia, mitral regurgitation, NSVT, orthostatic hypotension, RA, SSS, subdural hematoma, hernia repair, cardiac cath  PRECAUTIONS: Fall, ICD/Pacemaker, and Other: poor vision, STM impairments  WEIGHT BEARING RESTRICTIONS: No  PAIN:  Are you having pain? Yes: NPRS scale: 3/10 Pain location: B knees R > L Pain description: aching, discomfort Aggravating factors: prolonged sitting Relieving factors: movement  FALLS: Has patient fallen in last 6 months? Yes. Number of falls 2-3, getting in/out of bed or when turning to pick something up   LIVING ENVIRONMENT: Lives with: lives with their family (daughter comes and goes, will spend a few days per week in the home with them). Lives in: House/apartment Stairs: Yes: Internal: 14 steps; and External: 1 step to enter home  Has following equipment at home: Dan Humphreys - 2 wheeled, Environmental consultant - 4 wheeled, Tour manager, and Grab bars  PLOF: Independent and Independent with basic ADLs  PATIENT GOALS: improve endurance and engagement in ADLs  OBJECTIVE:   HAND DOMINANCE: Right  ADLs: Transfers/ambulation related to ADLs: Mod I to supervision with RW Eating: spouse typically cuts foods for him, pt will lean towards food instead of bringing food to mouth, spoon is preferred utensil Grooming: increased time UB Dressing: increased time, is ableto manage buttons LB Dressing: wears disposable underwear and pull on pants for increased ease Toileting: Mod I, does have a grab bar in front that he will pull up on; difficulty with hygiene post  BM Bathing: spouse assists with bathing, requires encouragement to bathe Tub Shower transfers: spouse assists with transfers Equipment: Transfer tub bench and Grab bars  IADLs: Medication management: spouse fills pill box and will remind him to take  MOBILITY STATUS: Needs Assist: Mod I to Supervision with RW and Hx of falls; required min assist to stand from chair without arm rests  POSTURE COMMENTS:  rounded shoulders, forward head, decreased lumbar lordosis, increased thoracic kyphosis, and right head tilt  ACTIVITY TOLERANCE: Activity tolerance: diminished  UPPER EXTREMITY ROM:    Active ROM Right eval Left eval  Shoulder flexion 90 110  Shoulder abduction    Shoulder adduction    Shoulder extension    Shoulder internal rotation 80% 70%  Shoulder external rotation 80% 80%  Elbow flexion    Elbow extension    Wrist flexion    Wrist extension    Wrist ulnar deviation    Wrist radial deviation    Wrist pronation    Wrist supination    (  Blank rows = not tested)  HAND FUNCTION: 03/29/23 Grip strength: Right: 45, 40, 36 lbs; Left: 41, 36, 33 lbs  COORDINATION: 03/22/23 9 Hole Peg test: Right: 53.19 sec; Left: 1:17.28 sec Box and Blocks:  Right 26 blocks, Left 23 blocks  COGNITION: Overall cognitive status: History of cognitive impairments - at baseline Brief Interview for Mental Status:   Memory:  Word Repetition:   "Sock"  "Blue"  "Bed"   Orientation Level:    Year:   2024  Month: Sept  Day of Week: Weds  Memory:  Recall:     Unable to recall "Sock"  with cue   Able to recall  "Blue"  without cue   Able to recall  "Bed"   with cue     No Recall   VISION: Subjective report: diplopia.  Recently went to ophthalmologist who applied a Fresnel prism 8 base in on the right lens of his readers. Baseline vision: Wears glasses for reading only  VISION ASSESSMENT: Impaired To be further assessed in functional context   TODAY'S TREATMENT:                                                                         DATE: 04/19/23 Sit > stand: engaged in massed practice with sit > stand from arm chair.  Pt initially pushing up with BUE on arm rests but with massed practice pt able to stand with less reliance on UE support.  adaptive techniques for LB dressing,  Resistance Clothespins 1,2,4,6# with LUE and then RUE for low and high functional reaching and sustained pinch; pt reaching down to low surface to pick up and then reach across midline to place on vertical dowel.  Pt then retrieving from high dowel and further crossing midline to place on lower surface.    04/12/23 Reiterated vision with use of prism glasses.  OT encouraged pt placing items in same place to facilitate increased carryover/use of glasses as well as consistent placement of items as memory compensatory strategy.   Bag exercises: Simulated ADLS for the following activities (using a plastic bag): donning shirt, drying back, pulling down shirt and donning pants.  OT providing verbal cues and demonstration for technique.  Pt demonstrating good amplitude with arm extension in front and when reaching behind.  OT providing increased verbal cues and demonstration when tossing bag over same shoulder and then reaching behind at lower back to grasp bag as if drying back.  Pt with extreme difficulty with simulated LB dressing, with difficulty lifting leg enough and bending forward enough to advance bag under foot.  LB dressing: engaged in attempts at figure 4 positioning with pt able to achieve only by pulling on pants leg which would not work when getting dressed as no pant to pull on.  Plan to continue to problem solve safe methods to aid in LB dressing.    04/03/23 Hand Gripper: with LUE then RUE on level 35# with yellow spring. Pt picked up 1 inch blocks with gripper with 2/10 drops with LUE and min difficulty and no drops with RUE.  Increased resistance on R to 50#.  Pt dropping 25% of blocks and  reaching farther across midline.  Therefore OT repositioned items to midline  with pt demonstrating improved success to completing 3/4 of remaining blocks without dropping. Coordination/vision: engaged in stacking of 1" blocks with focus on coordination, sustained attention, and visual attention to task.  Pt reports diplopia impacting success, therefore providing cues for positioning of items, use of glasses with Fresnel lenses, and other compensatory strategies to improve vision. Theraband: reviewed BUE strengthening with red theraband with OT providing min multimodal cues for sequencing and proper technique.  Pt completing 1 set of horizontal abduction with cues for symmetry and 1 set of 10 shoulder diagonals bilaterally.  OT providing demonstration and cues for "thumb up" position to increase ROM during diagonals.   03/29/23 Grip strength: Assessed grip strength (see above for measurements).  Pt reports difficulty with opening containers and sustained grasp with clothing management.  OT educated on adaptive equipment to aid in opening containers.  Pt trialing grip pad as well as jar opener for increased grasp when opening tight jar.  Pt to take grip pad home to continue to use, wife taking note of adaptive jar opener as option to purchase as needed. Theraband: engaged in BUE strengthening with red theraband with OT providing mod multimodal cues for sequencing and proper technique.  Pt completing 1 set of horizontal abduction with cues for symmetry and 1 set of 10 shoulder diagonals bilaterally.  OT providing demonstration and cues for "thumb up" position to increase ROM.   PATIENT EDUCATION: Education details: ongoing condition specific education Person educated: Patient, Spouse, and Child(ren) Education method: Explanation Education comprehension: verbalized understanding and needs further education  HOME EXERCISE PROGRAM: Access Code: [email or web address] URL: [email or web address] Date:  03/29/2023 Prepared by: [facility]  [facility] - [facility]  Exercises - Seated Shoulder Horizontal Abduction with Resistance  - 2 x daily - 10 reps - 3-5 sec hold - Seated Shoulder Diagonal with Resistance  - 2 x daily - 10 reps - 3-5 sec hold   GOALS: Goals reviewed with patient? Yes  SHORT TERM GOALS: Target date: 04/14/23  Pt and caregiver will be independent with gentle ROM HEP for improved functional use of BUE. Baseline: Goal status: IN PROGRESS  2.  Pt will verbalize understanding of task modifications and/or potential AE needs to increase ease, safety, and independence w/ ADLs Baseline:  Goal status: IN PROGRESS  3.  Pt will verbalize understanding of use of new glasses with Fresnel lenses for increased visual perception as needed for ADLs and IADLs. Baseline:  Goal status: IN PROGRESS   LONG TERM GOALS: Target date: 05/12/23  Pt will demonstrate and/or report ability to complete LB dressing to include footwear at Mod I level utilizing adaptive techniques and/or AE PRN. Baseline:  Goal status: IN PROGRESS  2.  Pt will be able to complete toileting hygiene at Mod I level with adaptive techniques and/or AE PRN. Baseline:  Goal status: IN PROGRESS  3.  Pt will demonstrate improved internal rotation as needed for clothing management and hygiene post toileting. Baseline:  Goal status: IN PROGRESS  4.  Pt will demonstrate improved sustained attention to task with ability to complete table top task for 10 mins with 1 or fewer cues for attention to task. Baseline:  Goal status: IN PROGRESS  ASSESSMENT:  CLINICAL IMPRESSION: Pt continues to require increased time for sequencing and following of directions due to verbosity and requiring increased time for processing.  Pt demonstrating good technique with bag exercises with opening/full arm extension when reaching infront and when reaching behind back.  Pt benefits from massed  practice and demonstration for  increased understanding and carryover.  Pt continues to demonstrate and report difficulty with reaching towards feet as needed for LB dressing.  PERFORMANCE DEFICITS: in functional skills including ADLs, IADLs, ROM, pain, flexibility, Gross motor control, balance, body mechanics, endurance, and decreased knowledge of use of DME, cognitive skills including attention, memory, problem solving, and safety awareness, and psychosocial skills including coping strategies, environmental adaptation, and habits.     PLAN:  OT FREQUENCY: 2x/week  OT DURATION: 8 weeks  PLANNED INTERVENTIONS: self care/ADL training, therapeutic exercise, therapeutic activity, neuromuscular re-education, passive range of motion, balance training, functional mobility training, ultrasound, compression bandaging, moist heat, cryotherapy, patient/family education, cognitive remediation/compensation, visual/perceptual remediation/compensation, psychosocial skills training, energy conservation, coping strategies training, and DME and/or AE instructions  RECOMMENDED OTHER SERVICES: NA  CONSULTED AND AGREED WITH PLAN OF CARE: Patient and family member/caregiver  PLAN FOR NEXT SESSION: adaptive techniques for LB dressing, review bag exercises, sit > stand, grip and UB strengthening   Tomeka Kantner, OTR/L 04/19/2023, 3:08 PM

## 2023-04-19 NOTE — Therapy (Signed)
OUTPATIENT SPEECH LANGUAGE PATHOLOGY TREATMENT   Patient Name: Jeff Weaver MRN: [id number] DOB:[birth year], 77 y.o., male Today's Date: 04/19/2023  PCP: Cheryll Cockayne, MD REFERRING PROVIDER: Same as PCP  END OF SESSION:  End of Session - 04/19/23 2142     Visit Number 7    Number of Visits 25    Date for SLP Re-Evaluation 06/13/23    SLP Start Time 1534    SLP Stop Time  1615    SLP Time Calculation (min) 41 min    Activity Tolerance Other (comment)   treatment limited by mentation (fixation)               Past Medical History:  Diagnosis Date   Aortic insufficiency    Atypical chest pain    Cardiac resynchronization therapy pacemaker (CRT-P) in place    Cerebral aneurysm    a. s/p clipping at [facility] in 8/13 c/b short-term memory loss, cerebral hemorrhage and seizure disorder s/p VP shunt.   Cerebral hemorrhage (HCC)    Chronic combined systolic and diastolic CHF (congestive heart failure) (HCC)    CVA (cerebral infarction)    Diverticulosis    Enteritis (regional)    Dr Juanda Chance   First degree AV block    GI bleed    HTN (hypertension)    Hyperlipidemia    Hypertensive heart disease    IBS (irritable bowel syndrome)    Lewy body dementia (HCC)    Mild CAD    Cardiac cath 01/12/18 showed minimal nonobstructive CAD, normal LVEF, normal LVEDP.   Mild dilation of ascending aorta (HCC)    Mitral regurgitation    NSVT (nonsustained ventricular tachycardia) (HCC)    Orthostatic hypotension    Pacemaker    Pre-diabetes    PVC's (premature ventricular contractions)    Regional enteritis of large intestine (HCC) since 1978   Rheumatoid arthritis(714.0)    dxed in [employer] in 1980s   Second degree AV block, Mobitz type I    Seizures (HCC)    last April 26,2021   Sinus bradycardia    Sleep apnea    SSS (sick sinus syndrome) (HCC)    Subdural hematoma (HCC)    Past Surgical History:  Procedure Laterality Date   BIV PACEMAKER INSERTION CRT-P N/A 03/10/2021    Procedure: BIV PACEMAKER INSERTION CRT-P UPGRADE;  Surgeon: Regan Lemming, MD;  Location: [facility] INVASIVE CV LAB;  Service: Cardiovascular;  Laterality: N/A;   CHOLECYSTECTOMY N/A 01/07/2016   Procedure: LAPAROSCOPIC CHOLECYSTECTOMY ;  Surgeon: Abigail Miyamoto, MD;  Location: [facility] OR;  Service: General;  Laterality: N/A;   cns shunt  02/23/12   COLONOSCOPY W/ POLYPECTOMY  1978   negative 2009, Dr Juanda Chance. Due 2014   CRANIOTOMY  02/02/12   Dr Kelby Aline, [facility]-clipping of aneurysm   CRANIOTOMY  02-02-12   left pterional craniotomy for clipping complex anterior communicating artery aneurysm    HERNIA REPAIR     LEFT HEART CATH AND CORONARY ANGIOGRAPHY N/A 01/12/2018   Procedure: LEFT HEART CATH AND CORONARY ANGIOGRAPHY;  Surgeon: Swaziland, Peter M, MD;  Location: [facility] INVASIVE CV LAB;  Service: Cardiovascular;  Laterality: N/A;   LEFT HEART CATHETERIZATION WITH CORONARY ANGIOGRAM N/A 11/26/2013   Procedure: LEFT HEART CATHETERIZATION WITH CORONARY ANGIOGRAM;  Surgeon: Marykay Lex, MD;  Location: [facility] CATH LAB;  Service: Cardiovascular;  Laterality: N/A;   NOSE SURGERY     PACEMAKER IMPLANT N/A 07/02/2018   Procedure: PACEMAKER IMPLANT;  Surgeon: Regan Lemming, MD;  Location: [facility]  INVASIVE CV LAB;  Service: Cardiovascular;  Laterality: N/A;   SHOULDER SURGERY  1997   TONSILLECTOMY AND ADENOIDECTOMY     VENTRICULOPERITONEAL SHUNT  02-23-12   INSERTION OF RIGHT FRONTAL VENTRICULOPERITONEAL SHUNT WITH A CODMAN HAKIM PROGRAMMABLE VALVE   Patient Active Problem List   Diagnosis Date Noted   Benign paroxysmal positional vertigo of right ear 03/03/2023   COVID-19 03/03/2023   Abrasion, forearm with infection, left, initial encounter 03/03/2023   Physical deconditioning 12/20/2022   Poor balance 12/20/2022   PVC's (premature ventricular contractions) 09/19/2022   Diplopia 09/19/2022   Parkinsonism (HCC) 06/19/2022   Primary localized osteoarthrosis of shoulder region 05/10/2022   Recurrent dislocation  of shoulder joint 05/10/2022   Regional enteritis (HCC) 05/10/2022   Vestibular hypofunction of left ear 05/10/2022   Sensorineural hearing loss 05/10/2022   Senile osteoporosis 02/03/2022   Pain in right hip 01/05/2022   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   Intertrigo 10/01/2021   HFrEF (heart failure with reduced ejection fraction) (HCC) 09/18/2021   Blepharitis of upper and lower eyelids of both eyes 09/18/2021   Tinea cruris 09/18/2021   Lewy body dementia (HCC) 09/16/2021   Closed fracture of right hip (HCC) 09/16/2021   S/P hip replacement, right  07/2021 09/16/2021   Presence of right artificial hip joint 08/06/2021   Vascular dementia (HCC) 04/29/2021   Multiple falls 04/29/2021   Cough 04/09/2021   Vitamin D deficiency 04/09/2021   Vitamin B1 deficiency 04/09/2021   Right arm pain 01/06/2021   Seizures (HCC)    Muscle cramping 12/30/2019   Dizziness 12/30/2019   Unsteady gait 12/30/2019   Pacemaker 11/27/2019   Fatigue 07/09/2019   SSS (sick sinus syndrome) (HCC) 07/02/2018   Bilateral shoulder pain 06/14/2018   Upper airway cough syndrome vs cough variant asthma 03/20/2018   Acute midline low back pain without sciatica 12/25/2017   Depression 10/17/2017   CAD (coronary artery disease), hx of NSTEMI 08/15/2017   Chest pain 07/17/2017   SOBOE (shortness of breath on exertion) 06/08/2017   Bilateral sensorineural hearing loss 06/01/2017   Nasal turbinate hypertrophy 06/01/2017   Dysphagia 05/31/2017   Osteoporosis 01/30/2017   History of cerebral hemorrhage 12/28/2016   Seborrheic dermatitis 07/25/2016   Cognitive communication deficit 06/28/2016   Hypertensive heart disease 06/28/2016   Vitamin B 12 deficiency 03/16/2016   Confusion 03/13/2016   VP (ventriculoperitoneal) shunt status 02/12/2016   Orthostatic hypotension 01/05/2016   Benign prostatic hyperplasia with urinary obstruction 08/04/2014   Urinary urgency 08/04/2014   Gastroesophageal reflux disease  07/14/2014   Polymyalgia rheumatica (HCC) 05/28/2014   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/2014   SBE (subacute bacterial endocarditis) prophylaxis candidate 11/24/2013   Aphasia 07/17/2013   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 seizures, not intractable, without status epilepticus (HCC) 07/17/2013   Subdural hemorrhage (HCC) 07/17/2013   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HCC) 07/17/2013   Pre-diabetes 03/26/2013   Urinary frequency 10/31/2012   Nocturia 10/31/2012   History of cerebral aneurysm repair 10/18/2012   History of stroke without residual deficits 10/18/2012   Ocular rosacea 09/27/2012   Seizure disorder (HCC) 07/23/2012   Entropion 07/10/2012   Hyperopia with astigmatism 07/10/2012   Combined senile cataract 04/13/2012   Presbyopia 04/13/2012   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) 03/01/2012   Stroke (HCC) 03/01/2012   Cognitive safety issue 02/20/2012   Nonruptured cerebral aneurysm 11/23/2011   Obstructive sleep apnea syndrome 08/09/2011   Hyperlipidemia 02/03/2009   HTN (hypertension) 02/03/2009  History of Crohn's disease 04/17/2008   Allergic rhinitis, unspecified 07/05/1999   Cataract, right eye 07/05/1999    ONSET DATE: chronic disease- script dated 02/21/23  REFERRING DIAG: R41.841 (ICD-10-CM) - Cognitive communication deficit G31.83,F02.80 (ICD-10-CM) - Lewy body dementia, unspecified dementia severity, unspecified whether behavioral, psychotic, or mood disturbance or anxiety (HCC) I63.512 (ICD-10-CM) - 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  Pt accompanied by: significant other - Jaime-wife  PERTINENT  HISTORY: Lewy body dementia, CVA, CHF, seizure disorder, Parkinson's, Crohn's disease.  PAIN:  Are you having pain? Yes: NPRS scale: 6/10 Pain location: bil knees  FALLS: Has patient fallen in last 6 months?  See PT evaluation for details   PATIENT GOALS: Increase speech clarity, assistance with food clearance  OBJECTIVE:  MBS - 04/07/23 Clinical Impression: Swallowing continues to be notable for mistiming of the pharyngeal swallow with thin, nectar, and honey-thick liquids entering the laryngeal vestibule prior to its closure. This led to intermittent, trace aspiration of all tested liquids. Aspiration was generally silent, excluding one occasion of coughing. There was limited laryngeal elevation or anterior movement, but base-of-tongue retraction and pharyngeal stripping helped close the epiglottis over larynx and contributed to clearance of boluses through pharynx. Curvature of cervical vertebrae contributed to risk of aspiration. Chair was reclined in an effort to facilitate travel of liquids through posterior pharynx; this position may have helped reduce quantity of aspiration, though it was difficult to quantify improvement. Pt viewed the video in real time and results were discussed with Lennox Grumbles and Mrs. Toni Arthurs afterwards. Pt's swallowing has been compromised at least since aspiration was documented in April's MBS. He has thus far not developed pneumonia. We discussed the importance of activity/mobility as well as pursuing further therapy to minimize the impact of aspiration. Pt should continue regular solids/thin liquids; meds whole in puree; sips of liquid should be taken one at a time. Recommend focus of tx on laryngeal mobility and timing of pharyngeal swallow. Pt will follow up with Verdie Mosher, OP SLP   MBS [facility] April 2024: Patient with mild oropharyngeal dysphagia, like secondary to age-related changes to the swallowing mechanism and generalized deconditioning. Dysphagia is  primarily characterized by mistiming of the pharyngeal swallow and reduced supraglottic closure, resulting in penetration of thin liquids during the swallow with intermittent silent aspiration on progression of cord-level residual. Cued post-swallow throat clearing was effective at mitigating penetrants. PAS scores below. Bolus propulsion and UES distension is largely intact. Based on today's examination, recommend patient continue regular solids/thin liquids, pills one at a time, with adherence to aspiration precautions and swallowing strategies including alternation of liquids and solids, SINGLE sips at a time, and regular post-swallow throat clearing after the swallow. Provided risks and adverse outcomes associated with dysphagia and aspiration (i.e possible life-threatening pneumonia) and rationale for adherence to management recommendations. Did provide signs of worsening dysphagia and return to clinic criteria. Will re-evaluate in 6 months, certainly sooner if indicated. Patient and family verbalized understanding of all exam results and recommendations.  Recommendations: Diet: Regular Aspiration precautions:  sit upright during all PO (as close to 90 degrees as possible) alternate bites/sips cough/clear throat regularly during the meal SINGLE sips  Medications: one at a time   PATIENT REPORTED OUTCOME  MEASURES (PROM): Communication Effectiveness Survey: forgot on 04/19/23; will bring next session  TODAY'S TREATMENT:                                                                                                                                         DATE:  04/19/23: Asher Muir to bring in CES next session. SLP asked her to and Natalia Leatherwood to fill out the form as well. Pt self-corrected loudness with nonverbal cue. SLP spent 8 minutes attempting to work on pt's loudness but pt cont to be fixated with issues re: family about the TV loudness. After 8 minutes of pt fixation, SLP abandoned this therapy  task and spoke about swallowing exercises. Asher Muir shared they did not have a chance to practice the exercises, so SLP attempted problem solving with pt and wife about when pt could fit them into his day. When asked, pt gave example of his PT exercises, so SLP reiterated swallowing exercises and repeated the question. Pt cont to demonstrate misunderstanding, so SLP suggested pt complete his swallowing exercises at the same time as his PT exercises. SLP discussed compensations for word finding with wife and pt, due to pt stating he experiences anomia at times at home.    04/12/23: SLP to ask for pt's CES next session. Given pt's MBS on Friday, SLP reviewed this with pt/wife. SLP initiated a HEP of Mendelsohn, supraglottic swallow, and "ee" glide. Pt req'd usual min-mod A initially, faded to rare min A. Marijean Niemann was asked by SLP if she was comfortable assisting pt with HEP and she answered affirmatively. SLP will shape pt's pitch glide next session as production was too harsh; SLP wanted to reinforce the gliding nature of the exercise at this time. Pt's wife and SLP agreed it would be challenging to have pt recline for meals and since quantifiable difference could  not be made between reclined and not reclined SLP and wife agreed pt should remain upright for meals.   04/03/23: Pt and Jamie agree that pt is talking louder at home. Throughout session, pt did in fact have louder, WNL volume for much of today's session in conversation, with average overall today 68dB (WNL 70-72dB). In sentence level responses pt's loudness was WNL, even with mod complex responses. Pt to cont to practice everyday sentences at home. Today, pt recited everyday sentences initially with average 73 dB, and with min cues for ensuring pt shouting/using considerable effort with everyday sentences he improved loudness to 83dB average.   03/29/23: Pt completed conversation in the ST room with [facility] loudness; rare min cues for loudness with monologues,  but pt successfully incr'd loudness each time. Asher Muir will bring CES next session. Hernandez recited his everyday sentences with initial cues for loudness. He indicated again that he did not want to shout, and SLP again reminded him that everyday sentences should be shouting, but this will not carry to conversation. Educated again  about rationale for everyday sentences (muscle memory for conversation). Because pt's memory skills are reduced, affecting pt's ability to recall that he needs to speak louder, SLP also educated wife and pt about effective communication techniques. According to Asher Muir she/they have already worked to find and implement some of these techniques already as pt's various challenges with communication began in 2013.  03/27/23: MBS scheduled for 04/07/23. SLP cued pt if he was having difficulty with drooling to swallow first and then wipe excess saliva. SLP provided a CES today to [place] and asked her to complete and return next session. SLP worked with pt's loudness today, first in everyday sentences pt with average 83 dB; in sentence completion tasks averaged 74dB with min A <10% for increasing loudness. In simiarity/difference task, pt's average loudness was 72dB however pt's responses did not match stimulus 25% of the time and SLP had to provide cues for pt to correctly answer, sometimes using max cues. Suspect fatigue played a role with this, as ST was pt's last therapy today.   03/22/23: Pt needs CES next session.  SLP introduced pt and wife to everyday sentences. Pt thought of 5 and wife generated 4. SLP instructed pt to recite these twice a day in a loud, strong voice, and explained rationale for pt and wife. Pt questioned rationale and SLP drew a diagram with WNL volume and pt's conversational volume and reiterated when he talks loudly it brings his volume into/closer to WNL range. Rhyan spoke in a louder volume for 20 seconds of a 35-second monologue about a general in the [employer] after this, and  answered affirmatively when asked if he was trying to use a louder voice. SLP encouraged him that this was WNL volume for a portion of his last conversational turn.  03/15/23 (eval): n/a  PATIENT EDUCATION: Education details: See "today's treatment" for details Person educated: Patient and Spouse Education method: Explanation, Demonstration, Verbal cues, and Handouts Education comprehension: verbalized understanding and needs further education  HOME EXERCISE PROGRAM: Swallow HEP as above   GOALS: Goals reviewed with patient? Yes (in general)  SHORT TERM GOALS: Target date: 04/14/23  Pt will complete MBS to assess current swallowing status and assess safe swallow strategies Baseline: Goal status: Met  2.  Pt will maintain average mid-upper 80s for loud /a/ in 3 sessions Baseline:  Goal status: INITIAL  3.  Pt will use speech volume average upper 60s dB for 5 minutes simple conversation in two sessions Baseline:  Goal status: INITIAL  4.  pt will generate abdominal breathing at rest (seated position) 80% of the time over 2 sessions Baseline:  Goal status: INITIAL   LONG TERM GOALS: Target date: 05/12/23  Pt will improve PROM measure scores in the last 1-2 sessions Baseline:  Goal status: INITIAL  2.  Pt will maintain average upper 80s for loud /a/ in 3 sessions Baseline:  Goal status: INITIAL  3.  Pt will use speech volume average low 70s dB for 8 minutes simple-mod complex  conversation with occasional verbal cues in two sessions Baseline:  Goal status: INITIAL  4.   Pt will generate abdominal breathing 60% of the time in 8 minutes simple-mod complex conversation over 2 sessions Baseline:  Goal status: INITIAL  5.  Pt will complete swallowing HEP with occasional min A in 3 sessions  Baseline:  Goal status: INITIAL   ASSESSMENT:  CLINICAL IMPRESSION: Patient is a 77 y.o. M who was seen today for treatment of swallowing in light of Parkinsonism  and other  complex medical hx. Goals for swallowing were added to LTGs following MBS on 04/07/23 (results above in "diagnostic findings"). See "today's treatment" for more details. "They are always telling me to speak up," pt stated to SLP on eval date. Pt's OT to target compensations for cognition. Given pt's performance today and in recent sessions, suspect many of pt's current cognitive communication deficits are due to compounded neurological insults with overlying dx of Lewy body dementia. SLP to focus on swallowing goals, mainly, and compensatory strategies for pt and family to mitigate communication breakdowns.  OBJECTIVE IMPAIRMENTS: Objective impairments include memory, dysarthria, and dysphagia. These impairments are limiting patient from ADLs/IADLs, effectively communicating at home and in community, and safety when swallowing.Factors affecting potential to achieve goals and functional outcome are ability to learn/carryover information and co-morbidities.. Patient will benefit from skilled SLP services to address above impairments and improve overall function.  REHAB POTENTIAL: Fair due to medical hx  PLAN:  SLP FREQUENCY: 2x/week  SLP DURATION: 8 weeks  PLANNED INTERVENTIONS: Aspiration precaution training, Pharyngeal strengthening exercises, Diet toleration management , Environmental controls, Trials of upgraded texture/liquids, Internal/external aids, Oral motor exercises, Functional tasks, SLP instruction and feedback, Compensatory strategies, and Patient/family education    [facility], CCC-SLP 04/19/2023, 9:44 PM

## 2023-04-20 ENCOUNTER — Ambulatory Visit: Payer: Medicare Other

## 2023-04-20 DIAGNOSIS — R471 Dysarthria and anarthria: Secondary | ICD-10-CM

## 2023-04-20 DIAGNOSIS — I69918 Other symptoms and signs involving cognitive functions following unspecified cerebrovascular disease: Secondary | ICD-10-CM

## 2023-04-20 DIAGNOSIS — R293 Abnormal posture: Secondary | ICD-10-CM

## 2023-04-20 DIAGNOSIS — M6281 Muscle weakness (generalized): Secondary | ICD-10-CM | POA: Diagnosis not present

## 2023-04-20 DIAGNOSIS — R2689 Other abnormalities of gait and mobility: Secondary | ICD-10-CM | POA: Diagnosis not present

## 2023-04-20 DIAGNOSIS — R2681 Unsteadiness on feet: Secondary | ICD-10-CM

## 2023-04-20 DIAGNOSIS — R1310 Dysphagia, unspecified: Secondary | ICD-10-CM

## 2023-04-20 NOTE — Therapy (Signed)
OUTPATIENT SPEECH LANGUAGE PATHOLOGY TREATMENT   Patient Name: Jeff Weaver MRN: [id number] DOB:02/09/1946, 77 y.o., male Today's Date: 04/20/2023  PCP: Cheryll Cockayne, MD REFERRING PROVIDER: Same as PCP  END OF SESSION:  End of Session - 04/20/23 1721     Visit Number 8    Number of Visits 25    Date for SLP Re-Evaluation 06/13/23    SLP Start Time 1617    SLP Stop Time  1700    SLP Time Calculation (min) 43 min    Activity Tolerance Patient tolerated treatment well                 Past Medical History:  Diagnosis Date   Aortic insufficiency    Atypical chest pain    Cardiac resynchronization therapy pacemaker (CRT-P) in place    Cerebral aneurysm    a. s/p clipping at [facility] in 8/13 c/b short-term memory loss, cerebral hemorrhage and seizure disorder s/p VP shunt.   Cerebral hemorrhage (HCC)    Chronic combined systolic and diastolic CHF (congestive heart failure) (HCC)    CVA (cerebral infarction)    Diverticulosis    Enteritis (regional)    Dr Juanda Chance   First degree AV block    GI bleed    HTN (hypertension)    Hyperlipidemia    Hypertensive heart disease    IBS (irritable bowel syndrome)    Lewy body dementia (HCC)    Mild CAD    Cardiac cath 01/12/18 showed minimal nonobstructive CAD, normal LVEF, normal LVEDP.   Mild dilation of ascending aorta (HCC)    Mitral regurgitation    NSVT (nonsustained ventricular tachycardia) (HCC)    Orthostatic hypotension    Pacemaker    Pre-diabetes    PVC's (premature ventricular contractions)    Regional enteritis of large intestine (HCC) since 1978   Rheumatoid arthritis(714.0)    dxed in [employer] in 1980s   Second degree AV block, Mobitz type I    Seizures (HCC)    last April 26,2021   Sinus bradycardia    Sleep apnea    SSS (sick sinus syndrome) (HCC)    Subdural hematoma (HCC)    Past Surgical History:  Procedure Laterality Date   BIV PACEMAKER INSERTION CRT-P N/A 03/10/2021   Procedure: BIV PACEMAKER  INSERTION CRT-P UPGRADE;  Surgeon: Regan Lemming, MD;  Location: [facility] INVASIVE CV LAB;  Service: Cardiovascular;  Laterality: N/A;   CHOLECYSTECTOMY N/A 01/07/2016   Procedure: LAPAROSCOPIC CHOLECYSTECTOMY ;  Surgeon: Abigail Miyamoto, MD;  Location: [facility] OR;  Service: General;  Laterality: N/A;   cns shunt  02/23/12   COLONOSCOPY W/ POLYPECTOMY  1978   negative 2009, Dr Juanda Chance. Due 2014   CRANIOTOMY  02/02/12   Dr Kelby Aline, [facility]-clipping of aneurysm   CRANIOTOMY  02-02-12   left pterional craniotomy for clipping complex anterior communicating artery aneurysm    HERNIA REPAIR     LEFT HEART CATH AND CORONARY ANGIOGRAPHY N/A 01/12/2018   Procedure: LEFT HEART CATH AND CORONARY ANGIOGRAPHY;  Surgeon: Swaziland, Peter M, MD;  Location: [facility] INVASIVE CV LAB;  Service: Cardiovascular;  Laterality: N/A;   LEFT HEART CATHETERIZATION WITH CORONARY ANGIOGRAM N/A 11/26/2013   Procedure: LEFT HEART CATHETERIZATION WITH CORONARY ANGIOGRAM;  Surgeon: Marykay Lex, MD;  Location: [facility] CATH LAB;  Service: Cardiovascular;  Laterality: N/A;   NOSE SURGERY     PACEMAKER IMPLANT N/A 07/02/2018   Procedure: PACEMAKER IMPLANT;  Surgeon: Regan Lemming, MD;  Location: [facility] INVASIVE CV LAB;  Service: Cardiovascular;  Laterality: N/A;   SHOULDER SURGERY  1997   TONSILLECTOMY AND ADENOIDECTOMY     VENTRICULOPERITONEAL SHUNT  02-23-12   INSERTION OF RIGHT FRONTAL VENTRICULOPERITONEAL SHUNT WITH A CODMAN HAKIM PROGRAMMABLE VALVE   Patient Active Problem List   Diagnosis Date Noted   Benign paroxysmal positional vertigo of right ear 03/03/2023   COVID-19 03/03/2023   Abrasion, forearm with infection, left, initial encounter 03/03/2023   Physical deconditioning 12/20/2022   Poor balance 12/20/2022   PVC's (premature ventricular contractions) 09/19/2022   Diplopia 09/19/2022   Parkinsonism (HCC) 06/19/2022   Primary localized osteoarthrosis of shoulder region 05/10/2022   Recurrent dislocation of shoulder joint  05/10/2022   Regional enteritis (HCC) 05/10/2022   Vestibular hypofunction of left ear 05/10/2022   Sensorineural hearing loss 05/10/2022   Senile osteoporosis 02/03/2022   Pain in right hip 01/05/2022   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   Intertrigo 10/01/2021   HFrEF (heart failure with reduced ejection fraction) (HCC) 09/18/2021   Blepharitis of upper and lower eyelids of both eyes 09/18/2021   Tinea cruris 09/18/2021   Lewy body dementia (HCC) 09/16/2021   Closed fracture of right hip (HCC) 09/16/2021   S/P hip replacement, right  07/2021 09/16/2021   Presence of right artificial hip joint 08/06/2021   Vascular dementia (HCC) 04/29/2021   Multiple falls 04/29/2021   Cough 04/09/2021   Vitamin D deficiency 04/09/2021   Vitamin B1 deficiency 04/09/2021   Right arm pain 01/06/2021   Seizures (HCC)    Muscle cramping 12/30/2019   Dizziness 12/30/2019   Unsteady gait 12/30/2019   Pacemaker 11/27/2019   Fatigue 07/09/2019   SSS (sick sinus syndrome) (HCC) 07/02/2018   Bilateral shoulder pain 06/14/2018   Upper airway cough syndrome vs cough variant asthma 03/20/2018   Acute midline low back pain without sciatica 12/25/2017   Depression 10/17/2017   CAD (coronary artery disease), hx of NSTEMI 08/15/2017   Chest pain 07/17/2017   SOBOE (shortness of breath on exertion) 06/08/2017   Bilateral sensorineural hearing loss 06/01/2017   Nasal turbinate hypertrophy 06/01/2017   Dysphagia 05/31/2017   Osteoporosis 01/30/2017   History of cerebral hemorrhage 12/28/2016   Seborrheic dermatitis 07/25/2016   Cognitive communication deficit 06/28/2016   Hypertensive heart disease 06/28/2016   Vitamin B 12 deficiency 03/16/2016   Confusion 03/13/2016   VP (ventriculoperitoneal) shunt status 02/12/2016   Orthostatic hypotension 01/05/2016   Benign prostatic hyperplasia with urinary obstruction 08/04/2014   Urinary urgency 08/04/2014   Gastroesophageal reflux disease 07/14/2014    Polymyalgia rheumatica (HCC) 05/28/2014   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/2014   SBE (subacute bacterial endocarditis) prophylaxis candidate 11/24/2013   Aphasia 07/17/2013   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 seizures, not intractable, without status epilepticus (HCC) 07/17/2013   Subdural hemorrhage (HCC) 07/17/2013   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HCC) 07/17/2013   Pre-diabetes 03/26/2013   Urinary frequency 10/31/2012   Nocturia 10/31/2012   History of cerebral aneurysm repair 10/18/2012   History of stroke without residual deficits 10/18/2012   Ocular rosacea 09/27/2012   Seizure disorder (HCC) 07/23/2012   Entropion 07/10/2012   Hyperopia with astigmatism 07/10/2012   Combined senile cataract 04/13/2012   Presbyopia 04/13/2012   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) 03/01/2012   Stroke (HCC) 03/01/2012   Cognitive safety issue 02/20/2012   Nonruptured cerebral aneurysm 11/23/2011   Obstructive sleep apnea syndrome 08/09/2011   Hyperlipidemia 02/03/2009   HTN (hypertension) 02/03/2009   History of Crohn's disease  04/17/2008   Allergic rhinitis, unspecified 07/05/1999   Cataract, right eye 07/05/1999    ONSET DATE: chronic disease- script dated 02/21/23  REFERRING DIAG: R41.841 (ICD-10-CM) - Cognitive communication deficit G31.83,F02.80 (ICD-10-CM) - Lewy body dementia, unspecified dementia severity, unspecified whether behavioral, psychotic, or mood disturbance or anxiety (HCC) I63.512 (ICD-10-CM) - 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  PAIN:  Are  you having pain? Yes: NPRS scale: 7/10 Pain location: bil knees Description: soreness Aggravating: Sit>stand Alleviating: movement  FALLS: Has patient fallen in last 6 months?  See PT evaluation for details   PATIENT GOALS: Increase speech clarity, assistance with food clearance  OBJECTIVE:  MBS - 04/07/23 Clinical Impression: Swallowing continues to be notable for mistiming of the pharyngeal swallow with thin, nectar, and honey-thick liquids entering the laryngeal vestibule prior to its closure. This led to intermittent, trace aspiration of all tested liquids. Aspiration was generally silent, excluding one occasion of coughing. There was limited laryngeal elevation or anterior movement, but base-of-tongue retraction and pharyngeal stripping helped close the epiglottis over larynx and contributed to clearance of boluses through pharynx. Curvature of cervical vertebrae contributed to risk of aspiration. Chair was reclined in an effort to facilitate travel of liquids through posterior pharynx; this position may have helped reduce quantity of aspiration, though it was difficult to quantify improvement. Pt viewed the video in real time and results were discussed with Lennox Grumbles and Mrs. Toni Arthurs afterwards. Pt's swallowing has been compromised at least since aspiration was documented in April's MBS. He has thus far not developed pneumonia. We discussed the importance of activity/mobility as well as pursuing further therapy to minimize the impact of aspiration. Pt should continue regular solids/thin liquids; meds whole in puree; sips of liquid should be taken one at a time. Recommend focus of tx on laryngeal mobility and timing of pharyngeal swallow. Pt will follow up with Verdie Mosher, OP SLP   MBS [facility] April 2024: Patient with mild oropharyngeal dysphagia, like secondary to age-related changes to the swallowing mechanism and generalized deconditioning. Dysphagia is primarily characterized by  mistiming of the pharyngeal swallow and reduced supraglottic closure, resulting in penetration of thin liquids during the swallow with intermittent silent aspiration on progression of cord-level residual. Cued post-swallow throat clearing was effective at mitigating penetrants. PAS scores below. Bolus propulsion and UES distension is largely intact. Based on today's examination, recommend patient continue regular solids/thin liquids, pills one at a time, with adherence to aspiration precautions and swallowing strategies including alternation of liquids and solids, SINGLE sips at a time, and regular post-swallow throat clearing after the swallow. Provided risks and adverse outcomes associated with dysphagia and aspiration (i.e possible life-threatening pneumonia) and rationale for adherence to management recommendations. Did provide signs of worsening dysphagia and return to clinic criteria. Will re-evaluate in 6 months, certainly sooner if indicated. Patient and family verbalized understanding of all exam results and recommendations.  Recommendations: Diet: Regular Aspiration precautions:  sit upright during all PO (as close to 90 degrees as possible) alternate bites/sips cough/clear throat regularly during the meal SINGLE sips  Medications: one at a time   PATIENT REPORTED OUTCOME MEASURES (PROM): Communication Effectiveness Survey: forgot on 04/19/23; will bring next  session  TODAY'S TREATMENT:                                                                                                                                         DATE:  04/20/23: Asher Muir did not give CES to SLP today.  SLP engaged pt in 7 minutes conversation, with occasional nonverbal cue pt maintained WNL speech volume. Pt still with more than average saliva production due to dental work. Today pt preferred Jamie to sit in the lobby. SLP worked with pt in improved speech clarity, in phrase and sentence responses. Pt req'd min cues  occasionally for increased diction/enunciation at this level. SLP wonders about carryover given pt's concurrent dx's. SLP to consider suggesting speech compensations written down and placed at locations in the house pt frequents.   10/16/24Asher Muir to bring in CES next session. SLP asked her to and Natalia Leatherwood to fill out the form as well. Pt self-corrected loudness with nonverbal cue. SLP spent 8 minutes attempting to work on pt's loudness but pt cont to be fixated with issues re: family about the TV loudness. After 8 minutes of pt fixation, SLP abandoned this therapy task and spoke about swallowing exercises. Asher Muir shared they did not have a chance to practice the exercises, so SLP attempted problem solving with pt and wife about when pt could fit them into his day. When asked, pt gave example of his PT exercises, so SLP reiterated swallowing exercises and repeated the question. Pt cont to demonstrate misunderstanding, so SLP suggested pt complete his swallowing exercises at the same time as his PT exercises. SLP discussed compensations for word finding with wife and pt, due to pt stating he experiences anomia at times at home.   No overt s/sx anomia today but pt told SLP he has anomic episodes - SLP reiterated naming compensations.   04/12/23: SLP to ask for pt's CES next session. Given pt's MBS on Friday, SLP reviewed this with pt/wife. SLP initiated a HEP of Mendelsohn, supraglottic swallow, and "ee" glide. Pt req'd usual min-mod A initially, faded to rare min A. Marijean Niemann was asked by SLP if she was comfortable assisting pt with HEP and she answered affirmatively. SLP will shape pt's pitch glide next session as production was too harsh; SLP wanted to reinforce the gliding nature of the exercise at this time. Pt's wife and SLP agreed it would be challenging to have pt recline for meals and since quantifiable difference could  not be made between reclined and not reclined SLP and wife agreed pt should remain  upright for meals.   04/03/23: Pt and Jamie agree that pt is talking louder at home. Throughout session, pt did in fact have louder, WNL volume for much of today's session in conversation, with average overall today 68dB (WNL 70-72dB). In sentence level responses pt's loudness was WNL, even with mod complex responses. Pt to cont to practice  everyday sentences at home. Today, pt recited everyday sentences initially with average 73 dB, and with min cues for ensuring pt shouting/using considerable effort with everyday sentences he improved loudness to 83dB average.   03/29/23: Pt completed conversation in the ST room with [facility] loudness; rare min cues for loudness with monologues, but pt successfully incr'd loudness each time. Asher Muir will bring CES next session. [place] recited his everyday sentences with initial cues for loudness. He indicated again that he did not want to shout, and SLP again reminded him that everyday sentences should be shouting, but this will not carry to conversation. Educated again about rationale for everyday sentences (muscle memory for conversation). Because pt's memory skills are reduced, affecting pt's ability to recall that he needs to speak louder, SLP also educated wife and pt about effective communication techniques. According to Asher Muir she/they have already worked to find and implement some of these techniques already as pt's various challenges with communication began in 2013.  03/27/23: MBS scheduled for 04/07/23. SLP cued pt if he was having difficulty with drooling to swallow first and then wipe excess saliva. SLP provided a CES today to [place] and asked her to complete and return next session. SLP worked with pt's loudness today, first in everyday sentences pt with average 83 dB; in sentence completion tasks averaged 74dB with min A <10% for increasing loudness. In simiarity/difference task, pt's average loudness was 72dB however pt's responses did not match stimulus 25% of the time and  SLP had to provide cues for pt to correctly answer, sometimes using max cues. Suspect fatigue played a role with this, as ST was pt's last therapy today.   03/22/23: Pt needs CES next session.  SLP introduced pt and wife to everyday sentences. Pt thought of 5 and wife generated 4. SLP instructed pt to recite these twice a day in a loud, strong voice, and explained rationale for pt and wife. Pt questioned rationale and SLP drew a diagram with WNL volume and pt's conversational volume and reiterated when he talks loudly it brings his volume into/closer to WNL range. [place] spoke in a louder volume for 20 seconds of a 35-second monologue about a general in the [employer] after this, and answered affirmatively when asked if he was trying to use a louder voice. SLP encouraged him that this was WNL volume for a portion of his last conversational turn.  03/15/23 (eval): n/a  PATIENT EDUCATION: Education details: See "today's treatment" for details Person educated: Patient and Spouse Education method: Explanation, Demonstration, Verbal cues, and Handouts Education comprehension: verbalized understanding and needs further education  HOME EXERCISE PROGRAM: Swallow HEP as above   GOALS: Goals reviewed with patient? Yes (in general)  SHORT TERM GOALS: Target date: 04/14/23  Pt will complete MBS to assess current swallowing status and assess safe swallow strategies Baseline: Goal status: Met  2.  Pt will maintain average mid-upper 80s for loud /a/ in 3 sessions Baseline:  Goal status: deferred (to LTGs)  3.  Pt will use speech volume average upper 60s dB for 5 minutes simple conversation in two sessions Baseline:  Goal status: not met  4.  pt will generate abdominal breathing at rest (seated position) 80% of the time over 2 sessions Baseline:  Goal status: DEFERRED   LONG TERM GOALS: Target date: 05/12/23  Pt will improve PROM measure scores in the last 1-2 sessions Baseline:  Goal status:  INITIAL  2.  Pt will maintain average upper 80s for loud /a/ in  3 sessions Baseline:  Goal status: INITIAL  3.  Pt will use speech volume average low 70s dB for 8 minutes simple-mod complex  conversation with occasional verbal cues in two sessions Baseline:  Goal status: INITIAL  4.   Pt will generate abdominal breathing 60% of the time in 8 minutes simple-mod complex conversation over 2 sessions Baseline:  Goal status: INITIAL  5.  Pt will complete swallowing HEP with occasional min A in 3 sessions  Baseline:  Goal status: INITIAL  6. Pt will maintain average mid-upper 80s for loud /a/ in 3 sessions Baseline:  Goal status: INITIAL (from STGs)   ASSESSMENT:  CLINICAL IMPRESSION: Patient is a 77 y.o. M who was seen today for treatment of swallowing in light of Parkinsonism and other complex medical hx. Goals for swallowing were added to LTGs following MBS on 04/07/23 (results above in "diagnostic findings"). See "today's treatment" for more details. "They are always telling me to speak up," pt stated to SLP on eval date. Pt's OT to target compensations for cognition. Given pt's performance today and in recent sessions, suspect many of pt's current cognitive communication deficits are due to compounded neurological insults with overlying dx of Lewy body dementia. SLP to focus on swallowing goals, mainly, and speech compensatory strategies for pt and family to mitigate communication breakdowns.  OBJECTIVE IMPAIRMENTS: Objective impairments include memory, dysarthria, and dysphagia. These impairments are limiting patient from ADLs/IADLs, effectively communicating at home and in community, and safety when swallowing.Factors affecting potential to achieve goals and functional outcome are ability to learn/carryover information and co-morbidities.. Patient will benefit from skilled SLP services to address above impairments and improve overall function.  REHAB POTENTIAL: Fair due to medical  hx  PLAN:  SLP FREQUENCY: 2x/week  SLP DURATION: 8 weeks  PLANNED INTERVENTIONS: Aspiration precaution training, Pharyngeal strengthening exercises, Diet toleration management , Environmental controls, Trials of upgraded texture/liquids, Internal/external aids, Oral motor exercises, Functional tasks, SLP instruction and feedback, Compensatory strategies, and Patient/family education    [facility], CCC-SLP 04/20/2023, 5:22 PM

## 2023-04-20 NOTE — Therapy (Signed)
OUTPATIENT PHYSICAL THERAPY NEURO TREATMENT and Progress Note   Patient Name: Jeff Weaver MRN: [id number] DOB:07/03/1946, 77 y.o., male Today's Date: 04/20/2023   PCP: Pincus Sanes, MD REFERRING PROVIDER: Pincus Sanes, MD Progress Note Reporting Period 02/20/23 to 04/20/23  See note below for Objective Data and Assessment of Progress/Goals.      END OF SESSION:  PT End of Session - 04/20/23 1541     Visit Number 10    Number of Visits 25    Date for PT Re-Evaluation 05/15/23    Authorization Type MCR/[employer]    Authorization Time Period 02/20/23 to 05/15/23    Progress Note Due on Visit 20    PT Start Time 1540   late arrival   PT Stop Time 1615    PT Time Calculation (min) 35 min    Equipment Utilized During Treatment Gait belt    Activity Tolerance Patient tolerated treatment well    Behavior During Therapy WFL for tasks assessed/performed                 Past Medical History:  Diagnosis Date   Aortic insufficiency    Atypical chest pain    Cardiac resynchronization therapy pacemaker (CRT-P) in place    Cerebral aneurysm    a. s/p clipping at [facility] in 8/13 c/b short-term memory loss, cerebral hemorrhage and seizure disorder s/p VP shunt.   Cerebral hemorrhage (HCC)    Chronic combined systolic and diastolic CHF (congestive heart failure) (HCC)    CVA (cerebral infarction)    Diverticulosis    Enteritis (regional)    Dr Juanda Chance   First degree AV block    GI bleed    HTN (hypertension)    Hyperlipidemia    Hypertensive heart disease    IBS (irritable bowel syndrome)    Lewy body dementia (HCC)    Mild CAD    Cardiac cath 01/12/18 showed minimal nonobstructive CAD, normal LVEF, normal LVEDP.   Mild dilation of ascending aorta (HCC)    Mitral regurgitation    NSVT (nonsustained ventricular tachycardia) (HCC)    Orthostatic hypotension    Pacemaker    Pre-diabetes    PVC's (premature ventricular contractions)    Regional enteritis of large  intestine (HCC) since 1978   Rheumatoid arthritis(714.0)    dxed in [employer] in 1980s   Second degree AV block, Mobitz type I    Seizures (HCC)    last April 26,2021   Sinus bradycardia    Sleep apnea    SSS (sick sinus syndrome) (HCC)    Subdural hematoma (HCC)    Past Surgical History:  Procedure Laterality Date   BIV PACEMAKER INSERTION CRT-P N/A 03/10/2021   Procedure: BIV PACEMAKER INSERTION CRT-P UPGRADE;  Surgeon: Regan Lemming, MD;  Location: [facility] INVASIVE CV LAB;  Service: Cardiovascular;  Laterality: N/A;   CHOLECYSTECTOMY N/A 01/07/2016   Procedure: LAPAROSCOPIC CHOLECYSTECTOMY ;  Surgeon: Abigail Miyamoto, MD;  Location: [facility] OR;  Service: General;  Laterality: N/A;   cns shunt  02/23/12   COLONOSCOPY W/ POLYPECTOMY  1978   negative 2009, Dr Juanda Chance. Due 2014   CRANIOTOMY  02/02/12   Dr Kelby Aline, [facility]-clipping of aneurysm   CRANIOTOMY  02-02-12   left pterional craniotomy for clipping complex anterior communicating artery aneurysm    HERNIA REPAIR     LEFT HEART CATH AND CORONARY ANGIOGRAPHY N/A 01/12/2018   Procedure: LEFT HEART CATH AND CORONARY ANGIOGRAPHY;  Surgeon: Swaziland, Peter M, MD;  Location: [facility]  INVASIVE CV LAB;  Service: Cardiovascular;  Laterality: N/A;   LEFT HEART CATHETERIZATION WITH CORONARY ANGIOGRAM N/A 11/26/2013   Procedure: LEFT HEART CATHETERIZATION WITH CORONARY ANGIOGRAM;  Surgeon: Marykay Lex, MD;  Location: [facility] CATH LAB;  Service: Cardiovascular;  Laterality: N/A;   NOSE SURGERY     PACEMAKER IMPLANT N/A 07/02/2018   Procedure: PACEMAKER IMPLANT;  Surgeon: Regan Lemming, MD;  Location: [facility] INVASIVE CV LAB;  Service: Cardiovascular;  Laterality: N/A;   SHOULDER SURGERY  1997   TONSILLECTOMY AND ADENOIDECTOMY     VENTRICULOPERITONEAL SHUNT  02-23-12   INSERTION OF RIGHT FRONTAL VENTRICULOPERITONEAL SHUNT WITH A CODMAN HAKIM PROGRAMMABLE VALVE   Patient Active Problem List   Diagnosis Date Noted   Benign paroxysmal positional vertigo of right ear  03/03/2023   COVID-19 03/03/2023   Abrasion, forearm with infection, left, initial encounter 03/03/2023   Physical deconditioning 12/20/2022   Poor balance 12/20/2022   PVC's (premature ventricular contractions) 09/19/2022   Diplopia 09/19/2022   Parkinsonism (HCC) 06/19/2022   Primary localized osteoarthrosis of shoulder region 05/10/2022   Recurrent dislocation of shoulder joint 05/10/2022   Regional enteritis (HCC) 05/10/2022   Vestibular hypofunction of left ear 05/10/2022   Sensorineural hearing loss 05/10/2022   Senile osteoporosis 02/03/2022   Pain in right hip 01/05/2022   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   Intertrigo 10/01/2021   HFrEF (heart failure with reduced ejection fraction) (HCC) 09/18/2021   Blepharitis of upper and lower eyelids of both eyes 09/18/2021   Tinea cruris 09/18/2021   Lewy body dementia (HCC) 09/16/2021   Closed fracture of right hip (HCC) 09/16/2021   S/P hip replacement, right  07/2021 09/16/2021   Presence of right artificial hip joint 08/06/2021   Vascular dementia (HCC) 04/29/2021   Multiple falls 04/29/2021   Cough 04/09/2021   Vitamin D deficiency 04/09/2021   Vitamin B1 deficiency 04/09/2021   Right arm pain 01/06/2021   Seizures (HCC)    Muscle cramping 12/30/2019   Dizziness 12/30/2019   Unsteady gait 12/30/2019   Pacemaker 11/27/2019   Fatigue 07/09/2019   SSS (sick sinus syndrome) (HCC) 07/02/2018   Bilateral shoulder pain 06/14/2018   Upper airway cough syndrome vs cough variant asthma 03/20/2018   Acute midline low back pain without sciatica 12/25/2017   Depression 10/17/2017   CAD (coronary artery disease), hx of NSTEMI 08/15/2017   Chest pain 07/17/2017   SOBOE (shortness of breath on exertion) 06/08/2017   Bilateral sensorineural hearing loss 06/01/2017   Nasal turbinate hypertrophy 06/01/2017   Dysphagia 05/31/2017   Osteoporosis 01/30/2017   History of cerebral hemorrhage 12/28/2016   Seborrheic dermatitis 07/25/2016    Cognitive communication deficit 06/28/2016   Hypertensive heart disease 06/28/2016   Vitamin B 12 deficiency 03/16/2016   Confusion 03/13/2016   VP (ventriculoperitoneal) shunt status 02/12/2016   Orthostatic hypotension 01/05/2016   Benign prostatic hyperplasia with urinary obstruction 08/04/2014   Urinary urgency 08/04/2014   Gastroesophageal reflux disease 07/14/2014   Polymyalgia rheumatica (HCC) 05/28/2014   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/2014   SBE (subacute bacterial endocarditis) prophylaxis candidate 11/24/2013   Aphasia 07/17/2013   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 seizures, not intractable, without status epilepticus (HCC) 07/17/2013   Subdural hemorrhage (HCC) 07/17/2013   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HCC) 07/17/2013   Pre-diabetes 03/26/2013   Urinary frequency 10/31/2012   Nocturia 10/31/2012   History of cerebral aneurysm repair 10/18/2012   History of stroke without residual deficits 10/18/2012   Ocular rosacea 09/27/2012   Seizure  disorder (HCC) 07/23/2012   Entropion 07/10/2012   Hyperopia with astigmatism 07/10/2012   Combined senile cataract 04/13/2012   Presbyopia 04/13/2012   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) 03/01/2012   Stroke (HCC) 03/01/2012   Cognitive safety issue 02/20/2012   Nonruptured cerebral aneurysm 11/23/2011   Obstructive sleep apnea syndrome 08/09/2011   Hyperlipidemia 02/03/2009   HTN (hypertension) 02/03/2009   History of Crohn's disease 04/17/2008   Allergic rhinitis, unspecified 07/05/1999   Cataract, right eye 07/05/1999    ONSET DATE: chronic   REFERRING DIAG: R26.89 (ICD-10-CM) - Poor balance R53.81 (ICD-10-CM) - Physical deconditioning  THERAPY DIAG:  Muscle weakness (generalized)  Other symptoms and signs involving cognitive functions following unspecified cerebrovascular disease  Unsteadiness on feet  Other abnormalities of gait and mobility  Abnormal  posture  Rationale for Evaluation and Treatment: Rehabilitation  SUBJECTIVE:                                                                                                                                                                                             SUBJECTIVE STATEMENT: Been having a rough day Pt accompanied by: significant other  PERTINENT HISTORY: Aortic insufficiency, CRT-P pacemaker placement, cerebral aneurysm s/p clipping, cerebral hemorrhage and seizure disorder s/p VP shunt, CHF, CVA, second degree AV block Mobitz type 1 s/p PPM, HTN, HLD, hypertensive heart disease, IBS, lew body dementia, mitral regurgitation, NSVT, orthostatic hypotension, RA, SSS, subdural hematoma, hernia repair, cardiac cath  PAIN:  Are you having pain? Yes: NPRS scale: 8-9/10 Pain location: right side of neck Pain description: sore, like a cramp Aggravating factors: movement, neck extension Relieving factors: rest  PRECAUTIONS: Fall and ICD/Pacemaker, poor vision, STM impairments  RED FLAGS: None   WEIGHT BEARING RESTRICTIONS: No  FALLS: Has patient fallen in last 6 months? Yes. Number of falls 2 in the past 6 months, had additional falls >6 months ago; per spouse, falls a lot when bending to pick something up off the floor, fall with SDH and hip fracture in 2023 was due to slipping on wet grass outside   LIVING ENVIRONMENT: Lives with: lives with their family Lives in: House/apartment Stairs: Yes: Internal: 14 steps; one step to enter home  Has following equipment at home: Single point cane and "semi-hospital bed that adjusts the head, walker with 2 wheels, 4WW and WC, shower bench"   PLOF: Independent, Independent with basic ADLs, Requires assistive device for independence, and handheld shower head; per spouse "house is older and difficult to renovate, may try to convert bathroom to a shower stall  PATIENT GOALS: improve strength, balance, endurance  OBJECTIVE:   TODAY'S  TREATMENT: 04/20/23 Activity Comments  NU-step x 8 min steady state level 6 For dynamic warm-up  MMT 5/5 gross strength  5xSTS 23 sec  300 ft = 1.67 ft/sec  Sidestepping x 2 min   Standing on foam Unsupported x 60 sec w/ CGA and UE support intermitent. Head turns 3x. EC 3x10 sec w/ anter-LOB     TODAY'S TREATMENT: 04/12/23 Activity Comments  March in place x 60 sec In // bars  Sidestepping x 2 min In // bars  TUG test 48 sec  Manual therapy -soft tissue mobilization R>L lateral cervical column to scalenes, upper trap, levator, and SCM to address pain, spasm, limited cervical ROM. Suboccipital release. Manual cervical traction  Supine cervical extension isometric 1x10 2 sec hold, tactile cues for facilitation/sequence              PATIENT EDUCATION: (SELF CARE) Education details: rationale of balance exercises Person educated: Patient and Spouse Education method: Explanation, Demonstration, Tactile cues, Verbal cues, and Handouts Education comprehension: verbalized understanding and returned demonstration   HOME EXERCISE PROGRAM: Access Code: [email or web address] URL: [email or web address] Date: 03/13/2023 Prepared by: [facility] Outpatient  Re[facility] - [facility]  Program Notes walking program: practice walking with the walker for 5 minutes inside the house every hour   Exercises - Seated March with Resistance  - 1 x daily - 5 x weekly - 2 sets - 10 reps - Seated Long Arc Quad  - 1 x daily - 5 x weekly - 3 sets - 10 reps - Seated Ankle Dorsiflexion AROM  - 1 x daily - 7 x weekly - 3 sets - 10 reps - 3 sec hold - Seated Heel Raise  - 1 x daily - 5 x weekly - 2 sets - 10 reps - Cervical Extension AROM with Strap  - 1 x daily - 7 x weekly - 3 sets - 10 reps - Side to Side Weight Shift with Overhead Reach and Counter Support  - 1 x daily - 7 x weekly - 3 sets - 10 reps - Standing Gastroc Stretch at Counter  - 1 x daily - 7 x weekly - 3 sets - 30-60 sec hold -  Supine Isometric Neck Extension  - 1 x daily - 7 x weekly - 3 sets - 10 reps - 2 sec hold  Added: sitting PWR! Moves handout  Use of boomwhackers/paper towel roll with twist and step 10x each    ----------------------------------------------------- Objective measures below taken at initial evaluation:  COGNITION: Overall cognitive status: History of cognitive impairments - at baseline       POSTURE: rounded shoulders, forward head, decreased lumbar lordosis, and increased thoracic kyphosis, flexed at hips     LOWER EXTREMITY MMT:    MMT Right Eval Left Eval  Hip flexion 3 3  Hip extension    Hip abduction 3 seated  3 seated  Hip adduction    Hip internal rotation    Hip external rotation    Knee flexion    Knee extension 4 4  Ankle dorsiflexion 3+ 3+  Ankle plantarflexion    Ankle inversion    Ankle eversion    (Blank rows = not tested)    TRANSFERS: Assistive device utilized: Single point cane  Sit to stand: SBA Stand to sit: SBA Chair to chair: SBA      GAIT: Gait pattern: step through pattern, decreased arm swing- Right, decreased arm swing- Left, decreased step length- Right,  decreased step length- Left, decreased stance time- Right, decreased stance time- Left, decreased stride length, decreased ankle dorsiflexion- Right, decreased ankle dorsiflexion- Left, Right foot flat, Left foot flat, shuffling, trendelenburg, decreased trunk rotation, trunk flexed, and wide BOS Distance walked: in clinic distances  Assistive device utilized: Single point cane Level of assistance: SBA Comments: slow and steady with SPC   FUNCTIONAL TESTS:  5 times sit to stand: 58 seconds use of UEs on chair  Timed up and go (TUG): 44 seconds, SPC  Gait speed 0.14 m/s with SPC   PATIENT SURVEYS:  FOTO next visit       GOALS: Goals reviewed with patient? Yes  SHORT TERM GOALS: Target date: 04/03/2023    Will be compliant with appropriate progressive HEP with no more  than MinA from family  Baseline: Goal status: MET  2.  Will improve gait speed to at least 0.28m/s with LRAD  Baseline: 0.3 w/ RW Goal status: MET  3.  Will complete TUG test in 30 seconds or better with LRAD  Baseline: 48 sec Goal status: IN PROGRESS  4.  Will complete 5xSTS test in 45 seconds or less with UEs  Baseline: 60 sec Goal status: NOT MET  5.  Will tolerate walking and/or activity in PT for at least 10 minutes without needing a rest break  Baseline:  Goal status: MET    LONG TERM GOALS: Target date: 05/15/2023    MMT to improve by one grade in all weak groups  Baseline: 5/5 gross strength Goal status: MET  2.  Will be able to ambulate at least 341ft in (0.32m/s) with LRAD to show improved community access no rest breaks   Baseline: (04/20/23) 300 ft Goal status: MET  3.  Will score at least 42 on the Berg to show reduced fall risk  Baseline:  Goal status: IN PROGRESS  4.  Will complete 5xSTS test in 20 seconds or less with UEs to show improved functional mobility  Baseline: (04/20/23) 23 sec Goal status: IN PROGRESS  5.  Will be compliant with appropriate progressive long term exercise plan with no more than MinA from family to combat sedentary lifestyle and maintain functional gains from PT  Baseline:  Goal status: IN PROGRESS    ASSESSMENT:  CLINICAL IMPRESSION: Progress note details performed with improved performance obvious with 5xSTS requiring less UE support and decreased time required.  w/ RW reveals greater gait speed and tolerance per distance and speed of 1.67 ft/sec.  Pt reports non-compliance with HEP and reinforced benefits of program, verbalizes understanding.  Difficulty with multi-sensory balance activities with unsupported standing with tendency for anterior-LOB.  Pt would benefit from continued sessions to progress balance and mobility activities to reduce risk for falls.  OBJECTIVE IMPAIRMENTS: Abnormal gait, decreased activity  tolerance, decreased balance, decreased cognition, decreased coordination, decreased knowledge of use of DME, decreased mobility, difficulty walking, decreased strength, decreased safety awareness, postural dysfunction, and obesity.   ACTIVITY LIMITATIONS: lifting, bending, standing, squatting, stairs, transfers, bed mobility, and locomotion level  PARTICIPATION LIMITATIONS: meal prep, cleaning, shopping, community activity, yard work, and church  PERSONAL FACTORS: Age, Fitness, Past/current experiences, Social background, and Time since onset of injury/illness/exacerbation are also affecting patient's functional outcome.   REHAB POTENTIAL: Good  CLINICAL DECISION MAKING: Evolving/moderate complexity  EVALUATION COMPLEXITY: Moderate  PLAN:  PT FREQUENCY: 2x/week  PT DURATION: 12 weeks  PLANNED INTERVENTIONS: Therapeutic exercises, Therapeutic activity, Neuromuscular re-education, Balance training, Gait training, Patient/Family education, Self Care, Stair training,  DME instructions, Aquatic Therapy, Dry Needling, Cognitive remediation, Cryotherapy, Moist heat, Manual therapy, and Re-evaluation  PLAN FOR NEXT SESSION: HEP review  3:42 PM, 04/20/23 M. Shary Decamp, PT, DPT Physical Therapist- [place] Office Number: [phone]

## 2023-04-24 ENCOUNTER — Ambulatory Visit: Payer: Medicare Other | Admitting: Occupational Therapy

## 2023-04-24 ENCOUNTER — Ambulatory Visit: Payer: Medicare Other | Admitting: Physical Therapy

## 2023-04-24 ENCOUNTER — Encounter: Payer: Self-pay | Admitting: Physical Therapy

## 2023-04-24 ENCOUNTER — Ambulatory Visit: Payer: Medicare Other

## 2023-04-24 DIAGNOSIS — R2689 Other abnormalities of gait and mobility: Secondary | ICD-10-CM | POA: Diagnosis not present

## 2023-04-24 DIAGNOSIS — M6281 Muscle weakness (generalized): Secondary | ICD-10-CM

## 2023-04-24 DIAGNOSIS — R2681 Unsteadiness on feet: Secondary | ICD-10-CM | POA: Diagnosis not present

## 2023-04-24 DIAGNOSIS — I69918 Other symptoms and signs involving cognitive functions following unspecified cerebrovascular disease: Secondary | ICD-10-CM | POA: Diagnosis not present

## 2023-04-24 DIAGNOSIS — R471 Dysarthria and anarthria: Secondary | ICD-10-CM | POA: Diagnosis not present

## 2023-04-24 DIAGNOSIS — R41840 Attention and concentration deficit: Secondary | ICD-10-CM

## 2023-04-24 DIAGNOSIS — R1310 Dysphagia, unspecified: Secondary | ICD-10-CM

## 2023-04-24 DIAGNOSIS — R293 Abnormal posture: Secondary | ICD-10-CM | POA: Diagnosis not present

## 2023-04-24 NOTE — Telephone Encounter (Signed)
Left message for patient/family to call back

## 2023-04-24 NOTE — Therapy (Signed)
OUTPATIENT OCCUPATIONAL THERAPY NEURO  Treatment Note  Patient Name: Jeff Weaver MRN: [id number] DOB:May 13, 1946, 77 y.o., male Today's Date: 04/24/2023  PCP: Pincus Sanes, MD REFERRING PROVIDER: Pincus Sanes, MD  END OF SESSION:  OT End of Session - 04/24/23 1559     Visit Number 8    Number of Visits 17    Date for OT Re-Evaluation 05/12/23    Authorization Type Medicare A&B    OT Start Time 1456   arrival time   OT Stop Time 1534    OT Time Calculation (min) 38 min                    Past Medical History:  Diagnosis Date   Aortic insufficiency    Atypical chest pain    Cardiac resynchronization therapy pacemaker (CRT-P) in place    Cerebral aneurysm    a. s/p clipping at [facility] in 8/13 c/b short-term memory loss, cerebral hemorrhage and seizure disorder s/p VP shunt.   Cerebral hemorrhage (HCC)    Chronic combined systolic and diastolic CHF (congestive heart failure) (HCC)    CVA (cerebral infarction)    Diverticulosis    Enteritis (regional)    Dr Juanda Chance   First degree AV block    GI bleed    HTN (hypertension)    Hyperlipidemia    Hypertensive heart disease    IBS (irritable bowel syndrome)    Lewy body dementia (HCC)    Mild CAD    Cardiac cath 01/12/18 showed minimal nonobstructive CAD, normal LVEF, normal LVEDP.   Mild dilation of ascending aorta (HCC)    Mitral regurgitation    NSVT (nonsustained ventricular tachycardia) (HCC)    Orthostatic hypotension    Pacemaker    Pre-diabetes    PVC's (premature ventricular contractions)    Regional enteritis of large intestine (HCC) since 1978   Rheumatoid arthritis(714.0)    dxed in [employer] in 1980s   Second degree AV block, Mobitz type I    Seizures (HCC)    last April 26,2021   Sinus bradycardia    Sleep apnea    SSS (sick sinus syndrome) (HCC)    Subdural hematoma (HCC)    Past Surgical History:  Procedure Laterality Date   BIV PACEMAKER INSERTION CRT-P N/A 03/10/2021   Procedure: BIV  PACEMAKER INSERTION CRT-P UPGRADE;  Surgeon: Regan Lemming, MD;  Location: [facility] INVASIVE CV LAB;  Service: Cardiovascular;  Laterality: N/A;   CHOLECYSTECTOMY N/A 01/07/2016   Procedure: LAPAROSCOPIC CHOLECYSTECTOMY ;  Surgeon: Abigail Miyamoto, MD;  Location: [facility] OR;  Service: General;  Laterality: N/A;   cns shunt  02/23/12   COLONOSCOPY W/ POLYPECTOMY  1978   negative 2009, Dr Juanda Chance. Due 2014   CRANIOTOMY  02/02/12   Dr Kelby Aline, [facility]-clipping of aneurysm   CRANIOTOMY  02-02-12   left pterional craniotomy for clipping complex anterior communicating artery aneurysm    HERNIA REPAIR     LEFT HEART CATH AND CORONARY ANGIOGRAPHY N/A 01/12/2018   Procedure: LEFT HEART CATH AND CORONARY ANGIOGRAPHY;  Surgeon: Swaziland, Peter M, MD;  Location: [facility] INVASIVE CV LAB;  Service: Cardiovascular;  Laterality: N/A;   LEFT HEART CATHETERIZATION WITH CORONARY ANGIOGRAM N/A 11/26/2013   Procedure: LEFT HEART CATHETERIZATION WITH CORONARY ANGIOGRAM;  Surgeon: Marykay Lex, MD;  Location: [facility] CATH LAB;  Service: Cardiovascular;  Laterality: N/A;   NOSE SURGERY     PACEMAKER IMPLANT N/A 07/02/2018   Procedure: PACEMAKER IMPLANT;  Surgeon: Regan Lemming,  MD;  Location: [facility] INVASIVE CV LAB;  Service: Cardiovascular;  Laterality: N/A;   SHOULDER SURGERY  1997   TONSILLECTOMY AND ADENOIDECTOMY     VENTRICULOPERITONEAL SHUNT  02-23-12   INSERTION OF RIGHT FRONTAL VENTRICULOPERITONEAL SHUNT WITH A CODMAN HAKIM PROGRAMMABLE VALVE   Patient Active Problem List   Diagnosis Date Noted   Benign paroxysmal positional vertigo of right ear 03/03/2023   COVID-19 03/03/2023   Abrasion, forearm with infection, left, initial encounter 03/03/2023   Physical deconditioning 12/20/2022   Poor balance 12/20/2022   PVC's (premature ventricular contractions) 09/19/2022   Diplopia 09/19/2022   Parkinsonism (HCC) 06/19/2022   Primary localized osteoarthrosis of shoulder region 05/10/2022   Recurrent dislocation of shoulder  joint 05/10/2022   Regional enteritis (HCC) 05/10/2022   Vestibular hypofunction of left ear 05/10/2022   Sensorineural hearing loss 05/10/2022   Senile osteoporosis 02/03/2022   Pain in right hip 01/05/2022   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   Intertrigo 10/01/2021   HFrEF (heart failure with reduced ejection fraction) (HCC) 09/18/2021   Blepharitis of upper and lower eyelids of both eyes 09/18/2021   Tinea cruris 09/18/2021   Lewy body dementia (HCC) 09/16/2021   Closed fracture of right hip (HCC) 09/16/2021   S/P hip replacement, right  07/2021 09/16/2021   Presence of right artificial hip joint 08/06/2021   Vascular dementia (HCC) 04/29/2021   Multiple falls 04/29/2021   Cough 04/09/2021   Vitamin D deficiency 04/09/2021   Vitamin B1 deficiency 04/09/2021   Right arm pain 01/06/2021   Seizures (HCC)    Muscle cramping 12/30/2019   Dizziness 12/30/2019   Unsteady gait 12/30/2019   Pacemaker 11/27/2019   Fatigue 07/09/2019   SSS (sick sinus syndrome) (HCC) 07/02/2018   Bilateral shoulder pain 06/14/2018   Upper airway cough syndrome vs cough variant asthma 03/20/2018   Acute midline low back pain without sciatica 12/25/2017   Depression 10/17/2017   CAD (coronary artery disease), hx of NSTEMI 08/15/2017   Chest pain 07/17/2017   SOBOE (shortness of breath on exertion) 06/08/2017   Bilateral sensorineural hearing loss 06/01/2017   Nasal turbinate hypertrophy 06/01/2017   Dysphagia 05/31/2017   Osteoporosis 01/30/2017   History of cerebral hemorrhage 12/28/2016   Seborrheic dermatitis 07/25/2016   Cognitive communication deficit 06/28/2016   Hypertensive heart disease 06/28/2016   Vitamin B 12 deficiency 03/16/2016   Confusion 03/13/2016   VP (ventriculoperitoneal) shunt status 02/12/2016   Orthostatic hypotension 01/05/2016   Benign prostatic hyperplasia with urinary obstruction 08/04/2014   Urinary urgency 08/04/2014   Gastroesophageal reflux disease 07/14/2014    Polymyalgia rheumatica (HCC) 05/28/2014   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/2014   SBE (subacute bacterial endocarditis) prophylaxis candidate 11/24/2013   Aphasia 07/17/2013   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 seizures, not intractable, without status epilepticus (HCC) 07/17/2013   Subdural hemorrhage (HCC) 07/17/2013   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HCC) 07/17/2013   Pre-diabetes 03/26/2013   Urinary frequency 10/31/2012   Nocturia 10/31/2012   History of cerebral aneurysm repair 10/18/2012   History of stroke without residual deficits 10/18/2012   Ocular rosacea 09/27/2012   Seizure disorder (HCC) 07/23/2012   Entropion 07/10/2012   Hyperopia with astigmatism 07/10/2012   Combined senile cataract 04/13/2012   Presbyopia 04/13/2012   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) 03/01/2012   Stroke (HCC) 03/01/2012   Cognitive safety issue 02/20/2012   Nonruptured cerebral aneurysm 11/23/2011   Obstructive sleep apnea syndrome 08/09/2011   Hyperlipidemia 02/03/2009   HTN (  hypertension) 02/03/2009   History of Crohn's disease 04/17/2008   Allergic rhinitis, unspecified 07/05/1999   Cataract, right eye 07/05/1999    ONSET DATE: referral date 02/21/23  REFERRING DIAG: R41.841 (ICD-10-CM) - Cognitive communication deficit G31.83,F02.80 (ICD-10-CM) - Lewy body dementia, unspecified dementia severity, unspecified whether behavioral, psychotic, or mood disturbance or anxiety (HCC) I63.512 (ICD-10-CM) - 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   Pt accompanied by: self and family member (spouse)  PERTINENT HISTORY: Aortic insufficiency, CRT-P pacemaker placement, cerebral aneurysm s/p clipping, cerebral hemorrhage and seizure disorder s/p VP shunt, CHF, CVA, second degree AV block Mobitz type 1 s/p PPM, HTN, HLD, hypertensive heart disease, IBS, lew body dementia, mitral regurgitation, NSVT, orthostatic hypotension, RA, SSS, subdural hematoma, hernia repair, cardiac cath  PRECAUTIONS: Fall, ICD/Pacemaker, and Other: poor vision, STM impairments  WEIGHT BEARING RESTRICTIONS: No  PAIN:  Are you having pain? Yes: NPRS scale: 3/10 Pain location: B knees R > L Pain description: aching, discomfort Aggravating factors: prolonged sitting Relieving factors: movement  FALLS: Has patient fallen in last 6 months? Yes. Number of falls 2-3, getting in/out of bed or when turning to pick something up   LIVING ENVIRONMENT: Lives with: lives with their family (daughter comes and goes, will spend a few days per week in the home with them). Lives in: House/apartment Stairs: Yes: Internal: 14 steps; and External: 1 step to enter home  Has following equipment at home: Dan Humphreys - 2 wheeled, Environmental consultant - 4 wheeled, Tour manager, and Grab bars  PLOF: Independent and Independent with basic ADLs  PATIENT GOALS: improve endurance and engagement in ADLs  OBJECTIVE:   HAND DOMINANCE: Right  ADLs: Transfers/ambulation related to ADLs: Mod I to supervision with RW Eating: spouse typically cuts foods for him, pt will lean towards food instead of bringing food to mouth, spoon is preferred utensil Grooming: increased time UB Dressing: increased time, is ableto manage buttons LB Dressing: wears disposable underwear and pull on pants for increased ease Toileting: Mod I, does have a grab bar in front that he will pull up on; difficulty with hygiene post  BM Bathing: spouse assists with bathing, requires encouragement to bathe Tub Shower transfers: spouse assists with transfers Equipment: Transfer tub bench and Grab bars  IADLs: Medication management: spouse fills pill box and will remind him to take  MOBILITY STATUS: Needs Assist: Mod I to Supervision with RW and Hx of falls; required min assist to stand from chair without arm rests  POSTURE COMMENTS:  rounded shoulders, forward head, decreased lumbar lordosis, increased thoracic kyphosis, and right head tilt  ACTIVITY TOLERANCE: Activity tolerance: diminished  UPPER EXTREMITY ROM:    Active ROM Right eval Left eval  Shoulder flexion 90 110  Shoulder abduction    Shoulder adduction    Shoulder extension    Shoulder internal rotation 80% 70%  Shoulder external rotation 80% 80%  Elbow flexion    Elbow extension    Wrist flexion    Wrist extension    Wrist ulnar deviation    Wrist radial deviation    Wrist  pronation    Wrist supination    (Blank rows = not tested)  HAND FUNCTION: 03/29/23 Grip strength: Right: 45, 40, 36 lbs; Left: 41, 36, 33 lbs  COORDINATION: 03/22/23 9 Hole Peg test: Right: 53.19 sec; Left: 1:17.28 sec Box and Blocks:  Right 26 blocks, Left 23 blocks  COGNITION: Overall cognitive status: History of cognitive impairments - at baseline Brief Interview for Mental Status:   Memory:  Word Repetition:   "Sock"  "Blue"  "Bed"   Orientation Level:    Year:   2024  Month: Sept  Day of Week: Weds  Memory:  Recall:     Unable to recall "Sock"  with cue   Able to recall  "Blue"  without cue   Able to recall  "Bed"   with cue     No Recall   VISION: Subjective report: diplopia.  Recently went to ophthalmologist who applied a Fresnel prism 8 base in on the right lens of his readers. Baseline vision: Wears glasses for reading only  VISION ASSESSMENT: Impaired To be further assessed in functional context   TODAY'S TREATMENT:                                                                         DATE: 04/24/23 Bed mobility: in/out of bed, bridging and scooting in bed, - educating on lifting buttocks and feet prior to scooting to facilitate clearance of buttocks.  Mod assist to sit to EOB.  Educated on loose sheet/blanket to decrease chances of getting twisted in covers. *** Sit > stand x5   04/19/23 Sit > stand: engaged in massed practice with sit > stand from arm chair.  Pt initially pushing up with BUE on arm rests but with massed practice pt able to stand with less reliance on UE support.  Educated on foot placement and wider BOS with sit > stand. Resistance Clothespins 1,2,4,6# with LUE for low and high functional reaching and sustained pinch; pt reaching down to low surface to pick up and then reach across midline to place on vertical dowel.  Pt then retrieving from high dowel and further crossing midline to place on lower surface. CGA for dynamic standing balance.  OT educating on functional carryover of task and importance of large amplitude with reaching.   04/12/23 Reiterated vision with use of prism glasses.  OT encouraged pt placing items in same place to facilitate increased carryover/use of glasses as well as consistent placement of items as memory compensatory strategy.   Bag exercises: Simulated ADLS for the following activities (using a plastic bag): donning shirt, drying back, pulling down shirt and donning pants.  OT providing verbal cues and demonstration for technique.  Pt demonstrating good amplitude with arm extension in front and when reaching behind.  OT providing increased verbal cues and demonstration when tossing bag over same shoulder and then reaching behind at lower back to grasp bag as if drying back.  Pt with extreme difficulty with simulated LB dressing, with difficulty lifting leg enough and bending forward enough to advance bag under foot.  LB dressing: engaged in attempts at figure 4 positioning with pt  able to achieve only by pulling on pants leg which would not work when getting  dressed as no pant to pull on.  Plan to continue to problem solve safe methods to aid in LB dressing.   PATIENT EDUCATION: Education details: ongoing condition specific education Person educated: Patient, Spouse, and Child(ren) Education method: Explanation Education comprehension: verbalized understanding and needs further education  HOME EXERCISE PROGRAM: Access Code: [email or web address] URL: [email or web address] Date: 03/29/2023 Prepared by: [facility] - Outpatient  Rehab - [facility]  Exercises - Seated Shoulder Horizontal Abduction with Resistance  - 2 x daily - 10 reps - 3-5 sec hold - Seated Shoulder Diagonal with Resistance  - 2 x daily - 10 reps - 3-5 sec hold   GOALS: Goals reviewed with patient? Yes  SHORT TERM GOALS: Target date: 04/14/23  Pt and caregiver will be independent with gentle ROM HEP for improved functional use of BUE. Baseline: Goal status: IN PROGRESS  2.  Pt will verbalize understanding of task modifications and/or potential AE needs to increase ease, safety, and independence w/ ADLs Baseline:  Goal status: IN PROGRESS  3.  Pt will verbalize understanding of use of new glasses with Fresnel lenses for increased visual perception as needed for ADLs and IADLs. Baseline:  Goal status: IN PROGRESS   LONG TERM GOALS: Target date: 05/12/23  Pt will demonstrate and/or report ability to complete LB dressing to include footwear at Mod I level utilizing adaptive techniques and/or AE PRN. Baseline:  Goal status: IN PROGRESS  2.  Pt will be able to complete toileting hygiene at Mod I level with adaptive techniques and/or AE PRN. Baseline:  Goal status: IN PROGRESS  3.  Pt will demonstrate improved internal rotation as needed for clothing management and hygiene post toileting. Baseline:  Goal status: IN PROGRESS  4.  Pt will demonstrate improved sustained attention  to task with ability to complete table top task for 10 mins with 1 or fewer cues for attention to task. Baseline:  Goal status: IN PROGRESS  ASSESSMENT:  CLINICAL IMPRESSION: Pt continues to require increased time for sequencing and following of directions due to verbosity and requiring increased time for processing.  Pt benefits from massed practice and demonstration for increased understanding and carryover.  Towards end of session, pt's wife reporting that pt has difficulty getting in/out of bed and tendency to get feet crossed up when attempting to stand.   PERFORMANCE DEFICITS: in functional skills including ADLs, IADLs, ROM, pain, flexibility, Gross motor control, balance, body mechanics, endurance, and decreased knowledge of use of DME, cognitive skills including attention, memory, problem solving, and safety awareness, and psychosocial skills including coping strategies, environmental adaptation, and habits.     PLAN:  OT FREQUENCY: 2x/week  OT DURATION: 8 weeks  PLANNED INTERVENTIONS: self care/ADL training, therapeutic exercise, therapeutic activity, neuromuscular re-education, passive range of motion, balance training, functional mobility training, ultrasound, compression bandaging, moist heat, cryotherapy, patient/family education, cognitive remediation/compensation, visual/perceptual remediation/compensation, psychosocial skills training, energy conservation, coping strategies training, and DME and/or AE instructions  RECOMMENDED OTHER SERVICES: NA  CONSULTED AND AGREED WITH PLAN OF CARE: Patient and family member/caregiver  PLAN FOR NEXT SESSION: adaptive techniques for LB dressing, bed mobility (particularly feet placement when getting in/out of bed and standing from EOB), review bag exercises, sit > stand, grip and UB strengthening   Kaijah Abts, OTR/L 04/24/2023, 4:00 PM

## 2023-04-24 NOTE — Therapy (Signed)
OUTPATIENT SPEECH LANGUAGE PATHOLOGY TREATMENT   Patient Name: Jeff Weaver MRN: [id number] DOB:1946-03-28, 77 y.o., male Today's Date: 04/24/2023  PCP: Cheryll Cockayne, MD REFERRING PROVIDER: Same as PCP  END OF SESSION:  End of Session - 04/24/23 1626     Visit Number 9    Number of Visits 25    Date for SLP Re-Evaluation 06/13/23    SLP Start Time 1623    SLP Stop Time  1703    SLP Time Calculation (min) 40 min    Activity Tolerance Patient tolerated treatment well                 Past Medical History:  Diagnosis Date   Aortic insufficiency    Atypical chest pain    Cardiac resynchronization therapy pacemaker (CRT-P) in place    Cerebral aneurysm    a. s/p clipping at [facility] in 8/13 c/b short-term memory loss, cerebral hemorrhage and seizure disorder s/p VP shunt.   Cerebral hemorrhage (HCC)    Chronic combined systolic and diastolic CHF (congestive heart failure) (HCC)    CVA (cerebral infarction)    Diverticulosis    Enteritis (regional)    Dr Juanda Chance   First degree AV block    GI bleed    HTN (hypertension)    Hyperlipidemia    Hypertensive heart disease    IBS (irritable bowel syndrome)    Lewy body dementia (HCC)    Mild CAD    Cardiac cath 01/12/18 showed minimal nonobstructive CAD, normal LVEF, normal LVEDP.   Mild dilation of ascending aorta (HCC)    Mitral regurgitation    NSVT (nonsustained ventricular tachycardia) (HCC)    Orthostatic hypotension    Pacemaker    Pre-diabetes    PVC's (premature ventricular contractions)    Regional enteritis of large intestine (HCC) since 1978   Rheumatoid arthritis(714.0)    dxed in [employer] in 1980s   Second degree AV block, Mobitz type I    Seizures (HCC)    last April 26,2021   Sinus bradycardia    Sleep apnea    SSS (sick sinus syndrome) (HCC)    Subdural hematoma (HCC)    Past Surgical History:  Procedure Laterality Date   BIV PACEMAKER INSERTION CRT-P N/A 03/10/2021   Procedure: BIV PACEMAKER  INSERTION CRT-P UPGRADE;  Surgeon: Regan Lemming, MD;  Location: [facility] INVASIVE CV LAB;  Service: Cardiovascular;  Laterality: N/A;   CHOLECYSTECTOMY N/A 01/07/2016   Procedure: LAPAROSCOPIC CHOLECYSTECTOMY ;  Surgeon: Abigail Miyamoto, MD;  Location: [facility] OR;  Service: General;  Laterality: N/A;   cns shunt  02/23/12   COLONOSCOPY W/ POLYPECTOMY  1978   negative 2009, Dr Juanda Chance. Due 2014   CRANIOTOMY  02/02/12   Dr Kelby Aline, [facility]-clipping of aneurysm   CRANIOTOMY  02-02-12   left pterional craniotomy for clipping complex anterior communicating artery aneurysm    HERNIA REPAIR     LEFT HEART CATH AND CORONARY ANGIOGRAPHY N/A 01/12/2018   Procedure: LEFT HEART CATH AND CORONARY ANGIOGRAPHY;  Surgeon: Swaziland, Peter M, MD;  Location: [facility] INVASIVE CV LAB;  Service: Cardiovascular;  Laterality: N/A;   LEFT HEART CATHETERIZATION WITH CORONARY ANGIOGRAM N/A 11/26/2013   Procedure: LEFT HEART CATHETERIZATION WITH CORONARY ANGIOGRAM;  Surgeon: Marykay Lex, MD;  Location: [facility] CATH LAB;  Service: Cardiovascular;  Laterality: N/A;   NOSE SURGERY     PACEMAKER IMPLANT N/A 07/02/2018   Procedure: PACEMAKER IMPLANT;  Surgeon: Regan Lemming, MD;  Location: [facility] INVASIVE CV LAB;  Service: Cardiovascular;  Laterality: N/A;   SHOULDER SURGERY  1997   TONSILLECTOMY AND ADENOIDECTOMY     VENTRICULOPERITONEAL SHUNT  02-23-12   INSERTION OF RIGHT FRONTAL VENTRICULOPERITONEAL SHUNT WITH A CODMAN HAKIM PROGRAMMABLE VALVE   Patient Active Problem List   Diagnosis Date Noted   Benign paroxysmal positional vertigo of right ear 03/03/2023   COVID-19 03/03/2023   Abrasion, forearm with infection, left, initial encounter 03/03/2023   Physical deconditioning 12/20/2022   Poor balance 12/20/2022   PVC's (premature ventricular contractions) 09/19/2022   Diplopia 09/19/2022   Parkinsonism (HCC) 06/19/2022   Primary localized osteoarthrosis of shoulder region 05/10/2022   Recurrent dislocation of shoulder joint  05/10/2022   Regional enteritis (HCC) 05/10/2022   Vestibular hypofunction of left ear 05/10/2022   Sensorineural hearing loss 05/10/2022   Senile osteoporosis 02/03/2022   Pain in right hip 01/05/2022   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   Intertrigo 10/01/2021   HFrEF (heart failure with reduced ejection fraction) (HCC) 09/18/2021   Blepharitis of upper and lower eyelids of both eyes 09/18/2021   Tinea cruris 09/18/2021   Lewy body dementia (HCC) 09/16/2021   Closed fracture of right hip (HCC) 09/16/2021   S/P hip replacement, right  07/2021 09/16/2021   Presence of right artificial hip joint 08/06/2021   Vascular dementia (HCC) 04/29/2021   Multiple falls 04/29/2021   Cough 04/09/2021   Vitamin D deficiency 04/09/2021   Vitamin B1 deficiency 04/09/2021   Right arm pain 01/06/2021   Seizures (HCC)    Muscle cramping 12/30/2019   Dizziness 12/30/2019   Unsteady gait 12/30/2019   Pacemaker 11/27/2019   Fatigue 07/09/2019   SSS (sick sinus syndrome) (HCC) 07/02/2018   Bilateral shoulder pain 06/14/2018   Upper airway cough syndrome vs cough variant asthma 03/20/2018   Acute midline low back pain without sciatica 12/25/2017   Depression 10/17/2017   CAD (coronary artery disease), hx of NSTEMI 08/15/2017   Chest pain 07/17/2017   SOBOE (shortness of breath on exertion) 06/08/2017   Bilateral sensorineural hearing loss 06/01/2017   Nasal turbinate hypertrophy 06/01/2017   Dysphagia 05/31/2017   Osteoporosis 01/30/2017   History of cerebral hemorrhage 12/28/2016   Seborrheic dermatitis 07/25/2016   Cognitive communication deficit 06/28/2016   Hypertensive heart disease 06/28/2016   Vitamin B 12 deficiency 03/16/2016   Confusion 03/13/2016   VP (ventriculoperitoneal) shunt status 02/12/2016   Orthostatic hypotension 01/05/2016   Benign prostatic hyperplasia with urinary obstruction 08/04/2014   Urinary urgency 08/04/2014   Gastroesophageal reflux disease 07/14/2014    Polymyalgia rheumatica (HCC) 05/28/2014   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/2014   SBE (subacute bacterial endocarditis) prophylaxis candidate 11/24/2013   Aphasia 07/17/2013   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 seizures, not intractable, without status epilepticus (HCC) 07/17/2013   Subdural hemorrhage (HCC) 07/17/2013   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HCC) 07/17/2013   Pre-diabetes 03/26/2013   Urinary frequency 10/31/2012   Nocturia 10/31/2012   History of cerebral aneurysm repair 10/18/2012   History of stroke without residual deficits 10/18/2012   Ocular rosacea 09/27/2012   Seizure disorder (HCC) 07/23/2012   Entropion 07/10/2012   Hyperopia with astigmatism 07/10/2012   Combined senile cataract 04/13/2012   Presbyopia 04/13/2012   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) 03/01/2012   Stroke (HCC) 03/01/2012   Cognitive safety issue 02/20/2012   Nonruptured cerebral aneurysm 11/23/2011   Obstructive sleep apnea syndrome 08/09/2011   Hyperlipidemia 02/03/2009   HTN (hypertension) 02/03/2009   History of Crohn's disease  04/17/2008   Allergic rhinitis, unspecified 07/05/1999   Cataract, right eye 07/05/1999    ONSET DATE: chronic disease- script dated 02/21/23  REFERRING DIAG: R41.841 (ICD-10-CM) - Cognitive communication deficit G31.83,F02.80 (ICD-10-CM) - Lewy body dementia, unspecified dementia severity, unspecified whether behavioral, psychotic, or mood disturbance or anxiety (HCC) I63.512 (ICD-10-CM) - 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  PAIN:  Are  you having pain? Yes: NPRS scale: 7/10 Pain location: bil knees Description: soreness Aggravating: Sit>stand Alleviating: movement  Are you having pain? Yes: NPRS scale: 4/10 Pain location: lumbar area Description: it's like a muscle that's trying to heal but has't Aggravating: walking Alleviating: sitting  FALLS: Has patient fallen in last 6 months?  See PT evaluation for details   PATIENT GOALS: Increase speech clarity, assistance with food clearance  OBJECTIVE:  MBS - 04/07/23 Clinical Impression: Swallowing continues to be notable for mistiming of the pharyngeal swallow with thin, nectar, and honey-thick liquids entering the laryngeal vestibule prior to its closure. This led to intermittent, trace aspiration of all tested liquids. Aspiration was generally silent, excluding one occasion of coughing. There was limited laryngeal elevation or anterior movement, but base-of-tongue retraction and pharyngeal stripping helped close the epiglottis over larynx and contributed to clearance of boluses through pharynx. Curvature of cervical vertebrae contributed to risk of aspiration. Chair was reclined in an effort to facilitate travel of liquids through posterior pharynx; this position may have helped reduce quantity of aspiration, though it was difficult to quantify improvement. Pt viewed the video in real time and results were discussed with Lennox Grumbles and Mrs. Toni Arthurs afterwards. Pt's swallowing has been compromised at least since aspiration was documented in April's MBS. He has thus far not developed pneumonia. We discussed the importance of activity/mobility as well as pursuing further therapy to minimize the impact of aspiration. Pt should continue regular solids/thin liquids; meds whole in puree; sips of liquid should be taken one at a time. Recommend focus of tx on laryngeal mobility and timing of pharyngeal swallow. Pt will follow up with Verdie Mosher, OP SLP   MBS [facility] April  2024: Patient with mild oropharyngeal dysphagia, like secondary to age-related changes to the swallowing mechanism and generalized deconditioning. Dysphagia is primarily characterized by mistiming of the pharyngeal swallow and reduced supraglottic closure, resulting in penetration of thin liquids during the swallow with intermittent silent aspiration on progression of cord-level residual. Cued post-swallow throat clearing was effective at mitigating penetrants. PAS scores below. Bolus propulsion and UES distension is largely intact. Based on today's examination, recommend patient continue regular solids/thin liquids, pills one at a time, with adherence to aspiration precautions and swallowing strategies including alternation of liquids and solids, SINGLE sips at a time, and regular post-swallow throat clearing after the swallow. Provided risks and adverse outcomes associated with dysphagia and aspiration (i.e possible life-threatening pneumonia) and rationale for adherence to management recommendations. Did provide signs of worsening dysphagia and return to clinic criteria. Will re-evaluate in 6 months, certainly sooner if indicated. Patient and family verbalized understanding of all exam results and recommendations.  Recommendations: Diet: Regular Aspiration precautions:  sit upright during all PO (as close to 90 degrees as possible) alternate bites/sips cough/clear throat  regularly during the meal SINGLE sips  Medications: one at a time   PATIENT REPORTED OUTCOME MEASURES (PROM): Communication Effectiveness Survey:  provided in session on 04/24/23. Daughter and Asher Muir scored exactly the same, 17/32, with lower scores indicating deficits impacting effectiveness of communication. "1" (not at all effective) scored for speaking in a noisy environment.   TODAY'S TREATMENT:                                                                                                                                          DATE:  04/24/23: When SLP asked pt if he thought he was speaking louder to [place] and Natalia Leatherwood he remarked that the TV is not loud enough for him. SLP redirected and he thought he was speaking louder. Pt's daughter reported she doesn't have to ask pt to speak up as often. SLP answered pt's questions about nasal drip and anterior labial leakage, only to ascertain 5 minutes later that pt is not eating when he has nasal drip. SLP referred pt to his PCP. SLP then clarified what MBS report said and encouraged family to complete dysphagia HEP at least x5/week with pt. Agreed. Daughter to sit in next session to observe dysphagia tx exercises to assist pt PRN. SLP then inquired what pt/family thought was important to work on. Memory strategies/compensations were mentioned; SLP to inquire with OT to see if these are/have been addressed with pt and family.   10/17/24Asher Muir did not give CES to SLP today.  SLP engaged pt in 7 minutes conversation, with occasional nonverbal cue pt maintained WNL speech volume. Pt still with more than average saliva production due to dental work. Today pt preferred Jamie to sit in the lobby. SLP worked with pt in improved speech clarity, in phrase and sentence responses. Pt req'd min cues occasionally for increased diction/enunciation at this level. SLP wonders about carryover given pt's concurrent dx's. SLP to consider suggesting speech compensations written down and placed at locations in the house pt frequents.   10/16/24Asher Muir to bring in CES next session. SLP asked her to and Natalia Leatherwood to fill out the form as well. Pt self-corrected loudness with nonverbal cue. SLP spent 8 minutes attempting to work on pt's loudness but pt cont to be fixated with issues re: family about the TV loudness. After 8 minutes of pt fixation, SLP abandoned this therapy task and spoke about swallowing exercises. Asher Muir shared they did not have a chance to practice the exercises, so SLP attempted problem  solving with pt and wife about when pt could fit them into his day. When asked, pt gave example of his PT exercises, so SLP reiterated swallowing exercises and repeated the question. Pt cont to demonstrate misunderstanding, so SLP suggested pt complete his swallowing exercises at the same time as his PT exercises. SLP discussed compensations for word finding with wife and pt, due to pt stating  he experiences anomia at times at home.   No overt s/sx anomia today but pt told SLP he has anomic episodes - SLP reiterated naming compensations.   04/12/23: SLP to ask for pt's CES next session. Given pt's MBS on Friday, SLP reviewed this with pt/wife. SLP initiated a HEP of Mendelsohn, supraglottic swallow, and "ee" glide. Pt req'd usual min-mod A initially, faded to rare min A. Marijean Niemann was asked by SLP if she was comfortable assisting pt with HEP and she answered affirmatively. SLP will shape pt's pitch glide next session as production was too harsh; SLP wanted to reinforce the gliding nature of the exercise at this time. Pt's wife and SLP agreed it would be challenging to have pt recline for meals and since quantifiable difference could  not be made between reclined and not reclined SLP and wife agreed pt should remain upright for meals.   04/03/23: Pt and Jamie agree that pt is talking louder at home. Throughout session, pt did in fact have louder, WNL volume for much of today's session in conversation, with average overall today 68dB (WNL 70-72dB). In sentence level responses pt's loudness was WNL, even with mod complex responses. Pt to cont to practice everyday sentences at home. Today, pt recited everyday sentences initially with average 73 dB, and with min cues for ensuring pt shouting/using considerable effort with everyday sentences he improved loudness to 83dB average.   03/29/23: Pt completed conversation in the ST room with [facility] loudness; rare min cues for loudness with monologues, but pt successfully incr'd  loudness each time. Asher Muir will bring CES next session. Reakwon recited his everyday sentences with initial cues for loudness. He indicated again that he did not want to shout, and SLP again reminded him that everyday sentences should be shouting, but this will not carry to conversation. Educated again about rationale for everyday sentences (muscle memory for conversation). Because pt's memory skills are reduced, affecting pt's ability to recall that he needs to speak louder, SLP also educated wife and pt about effective communication techniques. According to Asher Muir she/they have already worked to find and implement some of these techniques already as pt's various challenges with communication began in 2013.  03/27/23: MBS scheduled for 04/07/23. SLP cued pt if he was having difficulty with drooling to swallow first and then wipe excess saliva. SLP provided a CES today to [place] and asked her to complete and return next session. SLP worked with pt's loudness today, first in everyday sentences pt with average 83 dB; in sentence completion tasks averaged 74dB with min A <10% for increasing loudness. In simiarity/difference task, pt's average loudness was 72dB however pt's responses did not match stimulus 25% of the time and SLP had to provide cues for pt to correctly answer, sometimes using max cues. Suspect fatigue played a role with this, as ST was pt's last therapy today.   03/22/23: Pt needs CES next session.  SLP introduced pt and wife to everyday sentences. Pt thought of 5 and wife generated 4. SLP instructed pt to recite these twice a day in a loud, strong voice, and explained rationale for pt and wife. Pt questioned rationale and SLP drew a diagram with WNL volume and pt's conversational volume and reiterated when he talks loudly it brings his volume into/closer to WNL range. 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 SLP encouraged him that this was WNL volume for a portion of his last conversational turn.  03/15/23 (eval): n/a  PATIENT EDUCATION: Education details: See "today's treatment" for details Person educated: Patient and Spouse Education method: Explanation, Demonstration, Verbal cues, and Handouts Education comprehension: verbalized understanding and needs further education  HOME EXERCISE PROGRAM: Swallow HEP as above   GOALS: Goals reviewed with patient? Yes (in general)  SHORT TERM GOALS: Target date: 04/14/23  Pt will complete MBS to assess current swallowing status and assess safe swallow strategies Baseline: Goal status: Met  2.  Pt will maintain average mid-upper 80s for loud /a/ in 3 sessions Baseline:  Goal status: deferred (to LTGs)  3.  Pt will use speech volume average upper 60s dB for 5 minutes simple conversation in two sessions Baseline:  Goal status: not met  4.  pt will generate abdominal breathing at rest (seated position) 80% of the time over 2 sessions Baseline:  Goal status: DEFERRED   LONG TERM GOALS: Target date: 05/12/23  Pt will improve PROM measure scores in the last 1-2 sessions Baseline:  Goal status: INITIAL  2.  Pt will maintain average upper 80s for loud /a/ in 3 sessions Baseline:  Goal status: DEFERRED   3.  Pt will use speech volume average low 70s dB for 8 minutes simple-mod complex  conversation with occasional verbal cues in two sessions Baseline:  Goal status: DEFERRED  4.   Pt will generate abdominal breathing 60% of the time in 8 minutes simple-mod complex conversation over 2 sessions Baseline:  Goal status: DEFERRED  5.  Pt will complete swallowing HEP with occasional min A in 3 sessions  Baseline:  Goal status: INITIAL  6. Pt will maintain average mid-upper 80s for loud /a/ in 3 sessions Baseline:  Goal status: DEFERRED   ASSESSMENT:  CLINICAL IMPRESSION: Patient is a 77  y.o. M who was seen today for ST in light of Parkinsonism and other complex medical hx. Goals for swallowing were added to LTGs following MBS on 04/07/23 (results above in "diagnostic findings"). See "today's treatment" for more details. "They are always telling me to speak up," pt stated to SLP on eval date. Pt's OT to target compensations for cognition. Given pt's performance today and in recent sessions, suspect many of pt's current cognitive communication deficits are due to compounded neurological insults with overlying dx of Lewy body dementia. SLP to focus on swallowing goals, mainly, determining today that pt's speech volume has improved in that family has to ask less for [place] to repeat. SLP asking OT if memory compensations have been addressed with family - if not, SLP to take one session to address this - goal added if necessary at later date.  OBJECTIVE IMPAIRMENTS: Objective impairments include memory, dysarthria, and dysphagia. These impairments are limiting patient from ADLs/IADLs, effectively communicating at home and in community, and safety when swallowing.Factors affecting potential to achieve goals and functional outcome are ability to learn/carryover information and co-morbidities.. Patient will benefit from skilled SLP services to address above impairments and improve overall function.  REHAB POTENTIAL: Fair due to medical hx  PLAN:  SLP FREQUENCY: 2x/week  SLP DURATION: 8 weeks  PLANNED INTERVENTIONS: Aspiration precaution training, Pharyngeal strengthening exercises, Diet toleration management , Environmental controls, Trials of upgraded texture/liquids, Internal/external aids, Oral motor exercises, Functional tasks, SLP instruction and feedback, Compensatory strategies, and Patient/family education    [facility], CCC-SLP 04/24/2023, 4:27 PM

## 2023-04-24 NOTE — Therapy (Signed)
OUTPATIENT PHYSICAL THERAPY NEURO TREATMENT    Patient Name: Jeff Weaver MRN: [id number] DOB:1946/06/14, 77 y.o., male Today's Date: 04/24/2023   PCP: Pincus Sanes, MD REFERRING PROVIDER: Pincus Sanes, MD    END OF SESSION:  PT End of Session - 04/24/23 1536     Visit Number 11    Number of Visits 25    Date for PT Re-Evaluation 05/15/23    Authorization Type MCR/[employer]    Authorization Time Period 02/20/23 to 05/15/23    Progress Note Due on Visit 20    PT Start Time 1535    PT Stop Time 1623    PT Time Calculation (min) 48 min    Equipment Utilized During Treatment Gait belt    Activity Tolerance Patient tolerated treatment well    Behavior During Therapy [facility] for tasks assessed/performed                  Past Medical History:  Diagnosis Date   Aortic insufficiency    Atypical chest pain    Cardiac resynchronization therapy pacemaker (CRT-P) in place    Cerebral aneurysm    a. s/p clipping at [facility] in 8/13 c/b short-term memory loss, cerebral hemorrhage and seizure disorder s/p VP shunt.   Cerebral hemorrhage (HCC)    Chronic combined systolic and diastolic CHF (congestive heart failure) (HCC)    CVA (cerebral infarction)    Diverticulosis    Enteritis (regional)    Dr Juanda Chance   First degree AV block    GI bleed    HTN (hypertension)    Hyperlipidemia    Hypertensive heart disease    IBS (irritable bowel syndrome)    Lewy body dementia (HCC)    Mild CAD    Cardiac cath 01/12/18 showed minimal nonobstructive CAD, normal LVEF, normal LVEDP.   Mild dilation of ascending aorta (HCC)    Mitral regurgitation    NSVT (nonsustained ventricular tachycardia) (HCC)    Orthostatic hypotension    Pacemaker    Pre-diabetes    PVC's (premature ventricular contractions)    Regional enteritis of large intestine (HCC) since 1978   Rheumatoid arthritis(714.0)    dxed in [employer] in 1980s   Second degree AV block, Mobitz type I    Seizures (HCC)    last April  26,2021   Sinus bradycardia    Sleep apnea    SSS (sick sinus syndrome) (HCC)    Subdural hematoma (HCC)    Past Surgical History:  Procedure Laterality Date   BIV PACEMAKER INSERTION CRT-P N/A 03/10/2021   Procedure: BIV PACEMAKER INSERTION CRT-P UPGRADE;  Surgeon: Regan Lemming, MD;  Location: [facility] INVASIVE CV LAB;  Service: Cardiovascular;  Laterality: N/A;   CHOLECYSTECTOMY N/A 01/07/2016   Procedure: LAPAROSCOPIC CHOLECYSTECTOMY ;  Surgeon: Abigail Miyamoto, MD;  Location: [facility] OR;  Service: General;  Laterality: N/A;   cns shunt  02/23/12   COLONOSCOPY W/ POLYPECTOMY  1978   negative 2009, Dr Juanda Chance. Due 2014   CRANIOTOMY  02/02/12   Dr Kelby Aline, [facility]-clipping of aneurysm   CRANIOTOMY  02-02-12   left pterional craniotomy for clipping complex anterior communicating artery aneurysm    HERNIA REPAIR     LEFT HEART CATH AND CORONARY ANGIOGRAPHY N/A 01/12/2018   Procedure: LEFT HEART CATH AND CORONARY ANGIOGRAPHY;  Surgeon: Swaziland, Peter M, MD;  Location: [facility] INVASIVE CV LAB;  Service: Cardiovascular;  Laterality: N/A;   LEFT HEART CATHETERIZATION WITH CORONARY ANGIOGRAM N/A 11/26/2013   Procedure: LEFT HEART  CATHETERIZATION WITH CORONARY ANGIOGRAM;  Surgeon: Marykay Lex, MD;  Location: [facility] CATH LAB;  Service: Cardiovascular;  Laterality: N/A;   NOSE SURGERY     PACEMAKER IMPLANT N/A 07/02/2018   Procedure: PACEMAKER IMPLANT;  Surgeon: Regan Lemming, MD;  Location: [facility] INVASIVE CV LAB;  Service: Cardiovascular;  Laterality: N/A;   SHOULDER SURGERY  1997   TONSILLECTOMY AND ADENOIDECTOMY     VENTRICULOPERITONEAL SHUNT  02-23-12   INSERTION OF RIGHT FRONTAL VENTRICULOPERITONEAL SHUNT WITH A CODMAN HAKIM PROGRAMMABLE VALVE   Patient Active Problem List   Diagnosis Date Noted   Benign paroxysmal positional vertigo of right ear 03/03/2023   COVID-19 03/03/2023   Abrasion, forearm with infection, left, initial encounter 03/03/2023   Physical deconditioning 12/20/2022   Poor balance  12/20/2022   PVC's (premature ventricular contractions) 09/19/2022   Diplopia 09/19/2022   Parkinsonism (HCC) 06/19/2022   Primary localized osteoarthrosis of shoulder region 05/10/2022   Recurrent dislocation of shoulder joint 05/10/2022   Regional enteritis (HCC) 05/10/2022   Vestibular hypofunction of left ear 05/10/2022   Sensorineural hearing loss 05/10/2022   Senile osteoporosis 02/03/2022   Pain in right hip 01/05/2022   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   Intertrigo 10/01/2021   HFrEF (heart failure with reduced ejection fraction) (HCC) 09/18/2021   Blepharitis of upper and lower eyelids of both eyes 09/18/2021   Tinea cruris 09/18/2021   Lewy body dementia (HCC) 09/16/2021   Closed fracture of right hip (HCC) 09/16/2021   S/P hip replacement, right  07/2021 09/16/2021   Presence of right artificial hip joint 08/06/2021   Vascular dementia (HCC) 04/29/2021   Multiple falls 04/29/2021   Cough 04/09/2021   Vitamin D deficiency 04/09/2021   Vitamin B1 deficiency 04/09/2021   Right arm pain 01/06/2021   Seizures (HCC)    Muscle cramping 12/30/2019   Dizziness 12/30/2019   Unsteady gait 12/30/2019   Pacemaker 11/27/2019   Fatigue 07/09/2019   SSS (sick sinus syndrome) (HCC) 07/02/2018   Bilateral shoulder pain 06/14/2018   Upper airway cough syndrome vs cough variant asthma 03/20/2018   Acute midline low back pain without sciatica 12/25/2017   Depression 10/17/2017   CAD (coronary artery disease), hx of NSTEMI 08/15/2017   Chest pain 07/17/2017   SOBOE (shortness of breath on exertion) 06/08/2017   Bilateral sensorineural hearing loss 06/01/2017   Nasal turbinate hypertrophy 06/01/2017   Dysphagia 05/31/2017   Osteoporosis 01/30/2017   History of cerebral hemorrhage 12/28/2016   Seborrheic dermatitis 07/25/2016   Cognitive communication deficit 06/28/2016   Hypertensive heart disease 06/28/2016   Vitamin B 12 deficiency 03/16/2016   Confusion 03/13/2016   VP  (ventriculoperitoneal) shunt status 02/12/2016   Orthostatic hypotension 01/05/2016   Benign prostatic hyperplasia with urinary obstruction 08/04/2014   Urinary urgency 08/04/2014   Gastroesophageal reflux disease 07/14/2014   Polymyalgia rheumatica (HCC) 05/28/2014   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/2014   SBE (subacute bacterial endocarditis) prophylaxis candidate 11/24/2013   Aphasia 07/17/2013   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 seizures, not intractable, without status epilepticus (HCC) 07/17/2013   Subdural hemorrhage (HCC) 07/17/2013   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HCC) 07/17/2013   Pre-diabetes 03/26/2013   Urinary frequency 10/31/2012   Nocturia 10/31/2012   History of cerebral aneurysm repair 10/18/2012   History of stroke without residual deficits 10/18/2012   Ocular rosacea 09/27/2012   Seizure disorder (HCC) 07/23/2012   Entropion 07/10/2012   Hyperopia with astigmatism 07/10/2012   Combined senile cataract 04/13/2012   Presbyopia 04/13/2012  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) 03/01/2012   Stroke (HCC) 03/01/2012   Cognitive safety issue 02/20/2012   Nonruptured cerebral aneurysm 11/23/2011   Obstructive sleep apnea syndrome 08/09/2011   Hyperlipidemia 02/03/2009   HTN (hypertension) 02/03/2009   History of Crohn's disease 04/17/2008   Allergic rhinitis, unspecified 07/05/1999   Cataract, right eye 07/05/1999    ONSET DATE: chronic   REFERRING DIAG: R26.89 (ICD-10-CM) - Poor balance R53.81 (ICD-10-CM) - Physical deconditioning  THERAPY DIAG:  Muscle weakness (generalized)  Other symptoms and signs involving cognitive functions following unspecified cerebrovascular disease  Unsteadiness on feet  Other abnormalities of gait and mobility  Rationale for Evaluation and Treatment: Rehabilitation  SUBJECTIVE:                                                                                                                                                                                              SUBJECTIVE STATEMENT: Wife and daughter would like to try the U-step rollator (previous mentioned by this therapist, but not yet trialed in therapy). Pt accompanied by: significant other  PERTINENT HISTORY: Aortic insufficiency, CRT-P pacemaker placement, cerebral aneurysm s/p clipping, cerebral hemorrhage and seizure disorder s/p VP shunt, CHF, CVA, second degree AV block Mobitz type 1 s/p PPM, HTN, HLD, hypertensive heart disease, IBS, lew body dementia, mitral regurgitation, NSVT, orthostatic hypotension, RA, SSS, subdural hematoma, hernia repair, cardiac cath  PAIN:  Are you having pain? Yes: NPRS scale: 7-8/10 Pain location: right side of neck, left back Pain description: sore, like a cramp Aggravating factors: movement, neck extension Relieving factors: rest  PRECAUTIONS: Fall and ICD/Pacemaker, poor vision, STM impairments  RED FLAGS: None   WEIGHT BEARING RESTRICTIONS: No  FALLS: Has patient fallen in last 6 months? Yes. Number of falls 2 in the past 6 months, had additional falls >6 months ago; per spouse, falls a lot when bending to pick something up off the floor, fall with SDH and hip fracture in 2023 was due to slipping on wet grass outside   LIVING ENVIRONMENT: Lives with: lives with their family Lives in: House/apartment Stairs: Yes: Internal: 14 steps; one step to enter home  Has following equipment at home: Single point cane and "semi-hospital bed that adjusts the head, walker with 2 wheels, 4WW and WC, shower bench"   PLOF: Independent, Independent with basic ADLs, Requires assistive device for independence, and handheld shower head; per spouse "house is older and difficult to renovate, may try to convert bathroom to a shower stall  PATIENT GOALS: improve strength, balance, endurance   OBJECTIVE:    TODAY'S TREATMENT: 04/24/2023  Gait  training with U-step rollator, 75 ft.   Cues for use of handle to activate walker and stop.  Gait x 50 ft x 4, with U-step using laser light, for visual cues for increased step length.  Gait 75 ft, with U-step rollator with audio cue for pacing gait.  All gait trials with min guard and cues to maintain large step length.  Pt tends to walk faster than audio cue for pacing.  Discussed how U-step breaks down for placing in/out of car and how pt would use it as a seat for brief rest break with gait.  Pt does need cues to avoid excess posterior lean while sitting on seat of U-step RW.  Gait training with pt's RW, 125 ft, with min guard and cues for increased step length/foot clearance.  Pt does good job with foot clearance until last 50 ft of gait, where he begins to shuffle and hasten gait.  Discussed other options for potential for some type of seated walker for gait in the community, and that U-step is likely not the most viable option for a seated propulsion.  *Pt is interested in being able to join wife and daughter with shopping trips in the community, but often needs to sit.  Discussed other options, including UpWalker (may consider borrowing from [employer] to trial), or NitroDuet walker (combo rollator and lightweight w/c that he could use to propel with feet).  Discussed that [employer] or [employer] may have that in stock to trial.               PATIENT EDUCATION: (SELF CARE) Education details:pros/cons of U-step rollator; other options for assistive device in the community that he could safely use as walker or seat for rest break (and pt wants to be able to propel with feet) Person educated: Patient and Spouse, daughter Education method: Explanation, Demonstration, Tactile cues, Verbal cues, and Handouts Education comprehension: verbalized understanding and returned demonstration   HOME EXERCISE PROGRAM: Access Code: [email or web address] URL: [email or web address] Date: 03/13/2023 Prepared by: [facility]  - Outpatient  Rehab - [facility]  Program Notes walking program: practice walking with the walker for 5 minutes inside the house every hour   Exercises - Seated March with Resistance  - 1 x daily - 5 x weekly - 2 sets - 10 reps - Seated Long Arc Quad  - 1 x daily - 5 x weekly - 3 sets - 10 reps - Seated Ankle Dorsiflexion AROM  - 1 x daily - 7 x weekly - 3 sets - 10 reps - 3 sec hold - Seated Heel Raise  - 1 x daily - 5 x weekly - 2 sets - 10 reps - Cervical Extension AROM with Strap  - 1 x daily - 7 x weekly - 3 sets - 10 reps - Side to Side Weight Shift with Overhead Reach and Counter Support  - 1 x daily - 7 x weekly - 3 sets - 10 reps - Standing Gastroc Stretch at Counter  - 1 x daily - 7 x weekly - 3 sets - 30-60 sec hold - Supine Isometric Neck Extension  - 1 x daily - 7 x weekly - 3 sets - 10 reps - 2 sec hold  Added: sitting PWR! Moves handout  Use of boomwhackers/paper towel roll with twist and step 10x each    ----------------------------------------------------- Objective measures below taken at initial evaluation:  COGNITION: Overall cognitive status: History of cognitive impairments - at baseline  POSTURE: rounded shoulders, forward head, decreased lumbar lordosis, and increased thoracic kyphosis, flexed at hips     LOWER EXTREMITY MMT:    MMT Right Eval Left Eval  Hip flexion 3 3  Hip extension    Hip abduction 3 seated  3 seated  Hip adduction    Hip internal rotation    Hip external rotation    Knee flexion    Knee extension 4 4  Ankle dorsiflexion 3+ 3+  Ankle plantarflexion    Ankle inversion    Ankle eversion    (Blank rows = not tested)    TRANSFERS: Assistive device utilized: Single point cane  Sit to stand: SBA Stand to sit: SBA Chair to chair: SBA      GAIT: Gait pattern: step through pattern, decreased arm swing- Right, decreased arm swing- Left, decreased step length- Right, decreased step length- Left,  decreased stance time- Right, decreased stance time- Left, decreased stride length, decreased ankle dorsiflexion- Right, decreased ankle dorsiflexion- Left, Right foot flat, Left foot flat, shuffling, trendelenburg, decreased trunk rotation, trunk flexed, and wide BOS Distance walked: in clinic distances  Assistive device utilized: Single point cane Level of assistance: SBA Comments: slow and steady with SPC   FUNCTIONAL TESTS:  5 times sit to stand: 58 seconds use of UEs on chair  Timed up and go (TUG): 44 seconds, SPC  Gait speed 0.14 m/s with SPC   PATIENT SURVEYS:  FOTO next visit       GOALS: Goals reviewed with patient? Yes  SHORT TERM GOALS: Target date: 04/03/2023    Will be compliant with appropriate progressive HEP with no more than MinA from family  Baseline: Goal status: MET  2.  Will improve gait speed to at least 0.73m/s with LRAD  Baseline: 0.3 w/ RW Goal status: MET  3.  Will complete TUG test in 30 seconds or better with LRAD  Baseline: 48 sec Goal status: IN PROGRESS  4.  Will complete 5xSTS test in 45 seconds or less with UEs  Baseline: 60 sec Goal status: NOT MET  5.  Will tolerate walking and/or activity in PT for at least 10 minutes without needing a rest break  Baseline:  Goal status: MET    LONG TERM GOALS: Target date: 05/15/2023    MMT to improve by one grade in all weak groups  Baseline: 5/5 gross strength Goal status: MET  2.  Will be able to ambulate at least 362ft in (0.38m/s) with LRAD to show improved community access no rest breaks   Baseline: (04/20/23) 300 ft Goal status: MET  3.  Will score at least 42 on the Berg to show reduced fall risk  Baseline:  Goal status: IN PROGRESS  4.  Will complete 5xSTS test in 20 seconds or less with UEs to show improved functional mobility  Baseline: (04/20/23) 23 sec Goal status: IN PROGRESS  5.  Will be compliant with appropriate progressive long term exercise plan with no more  than MinA from family to combat sedentary lifestyle and maintain functional gains from PT  Baseline:  Goal status: IN PROGRESS    ASSESSMENT:  CLINICAL IMPRESSION: Skilled PT session today was focused on problem-solving and trialing varied assistive devices for gait.  Pt's wife asked about U-step rollator trial (previously mentioned by this PT, but not yet trialed).  Trialed rollator, with nice improvement in step length, but still after about 100-150 ft of gait, he begins to hasten with gait, needing cues to stop  U-step.  Also discussed pros/cons of other options for rollator type device for community-pt wants to be able to walk into stores with family, but sit and propel with feet as needed for a break.  Discussed that 4WW would likely not be an ideal options, as speed of 4WW would likely be unsafe.  Other options to try may be UpWalker and NitroDuet rollator/wheelchair combo.  Will continue to assess what is appropriate to help patient with safe mobility with family in the community.  OBJECTIVE IMPAIRMENTS: Abnormal gait, decreased activity tolerance, decreased balance, decreased cognition, decreased coordination, decreased knowledge of use of DME, decreased mobility, difficulty walking, decreased strength, decreased safety awareness, postural dysfunction, and obesity.   ACTIVITY LIMITATIONS: lifting, bending, standing, squatting, stairs, transfers, bed mobility, and locomotion level  PARTICIPATION LIMITATIONS: meal prep, cleaning, shopping, community activity, yard work, and church  PERSONAL FACTORS: Age, Fitness, Past/current experiences, Social background, and Time since onset of injury/illness/exacerbation are also affecting patient's functional outcome.   REHAB POTENTIAL: Good  CLINICAL DECISION MAKING: Evolving/moderate complexity  EVALUATION COMPLEXITY: Moderate  PLAN:  PT FREQUENCY: 2x/week  PT DURATION: 12 weeks  PLANNED INTERVENTIONS: Therapeutic exercises, Therapeutic  activity, Neuromuscular re-education, Balance training, Gait training, Patient/Family education, Self Care, Stair training, DME instructions, Aquatic Therapy, Dry Needling, Cognitive remediation, Cryotherapy, Moist heat, Manual therapy, and Re-evaluation  PLAN FOR NEXT SESSION: Assistive device trials-UpWalker (if able to borrow from Neuro) or ask if they have looked into NitroDuet.  Could also try U-step in clinic again.    Lonia Blood, PT 04/24/23 4:41 PM Phone: [phone] Fax: [phone]  [facility] Re[facility] at [facility] [address] [place], [address] [place], [place] [address] Phone # [phone] Fax # [phone]

## 2023-04-25 ENCOUNTER — Encounter: Payer: Self-pay | Admitting: Internal Medicine

## 2023-04-25 ENCOUNTER — Ambulatory Visit: Payer: Medicare Other | Attending: Internal Medicine | Admitting: Pharmacist

## 2023-04-25 DIAGNOSIS — E65 Localized adiposity: Secondary | ICD-10-CM

## 2023-04-25 NOTE — Patient Instructions (Signed)
I will submit a prior authorization for [facility] and let you know when I hear back.  GLP-1 Receptor Agonist Counseling Points This medication reduces your appetite and may make you feel [place] longer.  Stop eating when your body tells you that you are full. This will likely happen sooner than you are used to. Fried/greasy food and sweets may upset your stomach - minimize these as much as possible. Store your medication in the fridge until you are ready to use it. Inject your medication in the fatty tissue of your lower abdominal area (2 inches away from belly button) or upper outer thigh. Rotate injection sites. Common side effects include: nausea, diarrhea/constipation, and heartburn, and are more likely to occur if you overeat. Stop your injection for 7 days prior to surgical procedures requiring anesthesia.  Dosing schedule:  We will touch base with you monthly over the phone. The medication can be increased in monthly intervals depending on tolerability and efficacy.  Tips for success: Write down the reasons why you want to lose weight and post it in a place where you'll see it often.  Start small and work your way up. Keep in mind that it takes time to achieve goals, and small steps add up.  Any additional movements help to burn calories. Taking the stairs rather than the elevator and parking at the far end of your parking lot are easy ways to start. Brisk walking for at least 30 minutes 4 or more days of the week is an excellent goal to work toward  Understanding what it means to feel full: Did you know that it can take 15 minutes or more for your brain to receive the message that you've eaten? That means that, if you eat less food, but consume it slower, you may still feel satisfied.  Eating a lot of fruits and vegetables can also help you feel [place].  Eat off of smaller plates so that moderate portions don't seem too small  Tips for living a healthier life     Building a  Healthy and Balanced Diet Make most of your meal vegetables and fruits -  of your plate. Aim for color and variety, and remember that potatoes don't count as vegetables on the Healthy Eating Plate because of their negative impact on blood sugar.  Go for whole grains -  of your plate. Whole and intact grains--whole wheat, barley, wheat berries, quinoa, oats, brown rice, and foods made with them, such as whole wheat pasta--have a milder effect on blood sugar and insulin than white bread, white rice, and other refined grains.  Protein power -  of your plate. Fish, poultry, beans, and nuts are all healthy, versatile protein sources--they can be mixed into salads, and pair well with vegetables on a plate. Limit red meat, and avoid processed meats such as bacon and sausage.  Healthy plant oils - in moderation. Choose healthy vegetable oils like olive, canola, soy, corn, sunflower, peanut, and others, and avoid partially hydrogenated oils, which contain unhealthy trans fats. Remember that low-fat does not mean "healthy."  Drink water, coffee, or tea. Skip sugary drinks, limit milk and dairy products to one to two servings per day, and limit juice to a small glass per day.  Stay active. The red figure running across the Healthy Eating Plate's placemat is a reminder that staying active is also important in weight control.  The main message of the Healthy Eating Plate is to focus on diet quality:  The type of carbohydrate  in the diet is more important than the amount of carbohydrate in the diet, because some sources of carbohydrate--like vegetables (other than potatoes), fruits, whole grains, and beans--are healthier than others. The Healthy Eating Plate also advises consumers to avoid sugary beverages, a major source of calories--usually with little nutritional value--in the American diet. The Healthy Eating Plate encourages consumers to use healthy oils, and it does not set a maximum on the  percentage of calories people should get each day from healthy sources of fat. In this way, the Healthy Eating Plate recommends the opposite of the low-fat message promoted for decades by the USDA.  [employer]  SUGAR  Sugar is a huge problem in the modern day diet. Sugar is a big contributor to heart disease, diabetes, high triglyceride levels, fatty liver disease and obesity. Sugar is hidden in almost all packaged foods/beverages. Added sugar is extra sugar that is added beyond what is naturally found and has no nutritional benefit for your body. The American Heart Association recommends limiting added sugars to no more than 25g for women and 36 grams for men per day. There are many names for sugar including maltose, sucrose (names ending in "ose"), high fructose corn syrup, molasses, cane sugar, corn sweetener, raw sugar, syrup, honey or fruit juice concentrate.   One of the best ways to limit your added sugars is to stop drinking sweetened beverages such as soda, sweet tea, and fruit juice.  There is 65g of added sugars in one 20oz bottle of Coke! That is equal to 7.5 donuts.   Pay attention and read all nutrition facts labels. Below is an examples of a nutrition facts label. The #1 is showing you the total sugars where the # 2 is showing you the added sugars. This one serving has almost the max amount of added sugars per day!   EXERCISE  Exercise is good. We've all heard that. In an ideal world, we would all have time and resources to get plenty of it. When you are active, your heart pumps more efficiently and you will feel better.  Multiple studies show that even walking regularly has benefits that include living a longer life. The American Heart Association recommends 150 minutes per week of exercise (30 minutes per day most days of the week). You can do this in any increment you wish. Nine or more 10-minute walks count. So does an  hour-long exercise class. Break the time apart into what will work in your life. Some of the best things you can do include walking briskly, jogging, cycling or swimming laps. Not everyone is ready to "exercise." Sometimes we need to start with just getting active. Here are some easy ways to be more active throughout the day:  Take the stairs instead of the elevator  Go for a 10-15 minute walk during your lunch break (find a friend to make it more enjoyable)  When shopping, park at the back of the parking lot  If you take public transportation, get off one stop early and walk the extra distance  Pace around while making phone calls  Check with your doctor if you aren't sure what your limitations may be. Always remember to drink plenty of water when doing any type of exercise. Don't feel like a failure if you're not getting the 90-150 minutes per week. If you started by being a couch potato, then just a 10-minute walk each day is a huge improvement. Start with little victories and work your way up.  HEALTHY EATING TIPS              Plan ahead: make a menu of the meals for a week then create a grocery list to go with that menu. Consider meals that easily stretch into a night of leftovers, such as stews or casseroles. Or consider making two of your favorite meal and put one in the freezer for another night. Try a night or two each week that is "meatless" or "no cook" such as salads. When you get home from the grocery store wash and prepare your vegetables and fruits. Then when you need them they are ready to go.   Tips for going to the grocery store:  Buy store or generic brands  Check the weekly ad from your store on-line or in their in-store flyer  Look at the unit price on the shelf tag to compare/contrast the costs of different items  Buy fruits/vegetables in season  Carrots, bananas and apples are low-cost, naturally healthy items  If meats or frozen vegetables are on sale, buy some extras and  put in your freezer  Limit buying prepared or "ready to eat" items, even if they are pre-made salads or fruit snacks  Do not shop when you're hungry  Foods at eye level tend to be more expensive. Look on the high and low shelves for deals.  Consider shopping at the farmer's market for fresh foods in season.  Avoid the cookie and chip aisles (these are expensive, high in calories and low in nutritional value). Shop on the outside of the grocery store.  Healthy food preparations:  If you can't get lean hamburger, be sure to drain the fat when cooking  Steam, saut (in olive oil), grill or bake foods  Experiment with different seasonings to avoid adding salt to your foods. Kosher salt, sea salt and Himalayan salt are all still salt and should be avoided. Try seasoning food with onion, garlic, thyme, rosemary, basil ect. Onion powder or garlic powder is ok. Avoid if it says salt (ie garlic salt).

## 2023-04-25 NOTE — Progress Notes (Signed)
Patient ID: Jeff Weaver                 DOB: 28-Nov-1945                    MRN: [id number]      HPI: Jeff Weaver is a 77 y.o. male patient referred to pharmacy clinic by Ronie Spies to initiate weight loss therapy with GLP1-RA. 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 Most recent BMI 36.   Was seen by Ronie Spies 04/12/23. Family inquired about GLP-1. York Spaniel it was suggested by neurology.   Patient accompanied by his wife and daughter today to appointment. Patient does struggle with memory and has an extensive medical hx. 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 Struggles with lack of routine. Cannot sleep at night, ends up awake most of the night and sleeps a during day. Food routine is not consistent. Sometimes eats 2 meals and snacks and sometimes 1 large meal and might snack at night. Patient states he eats a lot of fast food, but per wife, he does not. He does drink soda/Gatorade and eats candy, sweets at night. They try to limit how much is in the house. They think he may be eating at night and forgetting that he ate. Daughter/wife cook. Usually a meat and veggies. Last night was fish, cabbage and another vegetable.   We discussed side effects of Wegovy that might be of concern. He does have hx of Crohn's/diverticulitis but it has been well controlled for many years. He does aspirate per barium swallow, but no change in  diet/medications were made. He was having some issues with loose bowels. No bowl obstruction seen on CT.   Baseline weight/BMI: 246lb/36  Insurance payor:  [employer] for Life  Diet:  -Breakfast: generally doesn't eat- usually sleeping -Lunch: 50/50 if he eats -Dinner: fish, cabbage, vegetables -Snacks: candies, cakes,  -Drinks: cola, water, Gatorade   Exercise: walks with cane/walker- in physical/occupational therapy.   Family History:  Family History  Problem Relation Age of Onset   COPD Father    Coronary artery disease Father        MI in 66s   Hepatitis Mother    Diabetes Sister    Dementia Sister 30   Diabetes Brother    Colon cancer Neg Hx    Esophageal cancer Neg Hx    Rectal cancer Neg Hx    Stomach cancer Neg Hx    Colon polyps Neg Hx      Social History:  Social History   Socioeconomic History   Marital status: Married    Spouse name: Not on file   Number of children: 1   Years of education: Not on file   Highest education level: Not on file  Occupational History   Occupation: retired/ Heritage manager  Tobacco Use   Smoking status: Former    Current packs/day: 0.00    Types: Pipe, Cigarettes  Quit date: 07/04/1978    Years since quitting: 44.8   Smokeless tobacco: Former  Building services engineer status: Never Used  Substance and Sexual Activity   Alcohol use: Yes    Alcohol/week: 0.0 standard drinks of alcohol    Comment: Very Infrequently    Drug use: No   Sexual activity: Not Currently  Other Topics Concern   Not on file  Social History Narrative   Not on file   Social Determinants of Health   Financial Resource Strain: Low Risk  (05/30/2022)   Overall Financial Resource Strain (CARDIA)    Difficulty of Paying Living Expenses: Not hard at all  Food Insecurity: No Food Insecurity (05/30/2022)   Hunger Vital Sign    Worried About Running Out of Food in the Last Year: Never true    Ran Out of Food in the Last Year: Never true  Transportation Needs:  No Transportation Needs (05/30/2022)   PRAPARE - Administrator, Civil Service (Medical): No    Lack of Transportation (Non-Medical): No  Physical Activity: Insufficiently Active (05/30/2022)   Exercise Vital Sign    Days of Exercise per Week: 3 days    Minutes of Exercise per Session: 30 min  Stress: No Stress Concern Present (05/30/2022)   [employer] of Occupational Health - Occupational Stress Questionnaire    Feeling of Stress : Only a little  Social Connections: Moderately Isolated (05/30/2022)   Social Connection and Isolation Panel [NHANES]    Frequency of Communication with Friends and Family: More than three times a week    Frequency of Social Gatherings with Friends and Family: More than three times a week    Attends Religious Services: Never    Database administrator or Organizations: No    Attends Banker Meetings: Never    Marital Status: Married  Catering manager Violence: Not At Risk (05/30/2022)   Humiliation, Afraid, Rape, and Kick questionnaire    Fear of Current or Ex-Partner: No    Emotionally Abused: No    Physically Abused: No    Sexually Abused: No     Labs: Lab Results  Component Value Date   HGBA1C 5.9 03/21/2022    Wt Readings from Last 1 Encounters:  04/12/23 241 lb 9.6 oz (109.6 kg)    BP Readings from Last 1 Encounters:  04/12/23 130/78   Pulse Readings from Last 1 Encounters:  04/12/23 66       Component Value Date/Time   CHOL 125 03/24/2023 1141   CHOL 159 03/27/2013 0818   TRIG 94 03/24/2023 1141   TRIG 127 03/27/2013 0818   HDL 44 03/24/2023 1141   HDL 45 03/27/2013 0818   CHOLHDL 2.8 03/24/2023 1141   CHOLHDL 3 03/21/2022 1249   VLDL 20.6 03/21/2022 1249   LDLCALC 63 03/24/2023 1141   LDLCALC 89 03/27/2013 0818   LDLDIRECT 87.6 09/19/2012 1206    Past Medical History:  Diagnosis Date   Aortic insufficiency    Atypical chest pain    Cardiac resynchronization therapy pacemaker (CRT-P) in  place    Cerebral aneurysm    a. s/p clipping at [facility] in 8/13 c/b short-term memory loss, cerebral hemorrhage and seizure disorder s/p VP shunt.   Cerebral hemorrhage (HCC)    Chronic combined systolic and diastolic CHF (congestive heart failure) (HCC)    CVA (cerebral infarction)    Diverticulosis    Enteritis (regional)    Dr Juanda Chance   First degree AV  block    GI bleed    HTN (hypertension)    Hyperlipidemia    Hypertensive heart disease    IBS (irritable bowel syndrome)    Lewy body dementia (HCC)    Mild CAD    Cardiac cath 01/12/18 showed minimal nonobstructive CAD, normal LVEF, normal LVEDP.   Mild dilation of ascending aorta (HCC)    Mitral regurgitation    NSVT (nonsustained ventricular tachycardia) (HCC)    Orthostatic hypotension    Pacemaker    Pre-diabetes    PVC's (premature ventricular contractions)    Regional enteritis of large intestine (HCC) since 1978   Rheumatoid arthritis(714.0)    dxed in [employer] in 1980s   Second degree AV block, Mobitz type I    Seizures (HCC)    last April 26,2021   Sinus bradycardia    Sleep apnea    SSS (sick sinus syndrome) (HCC)    Subdural hematoma (HCC)     Current Outpatient Medications on File Prior to Visit  Medication Sig Dispense Refill   acetaminophen (TYLENOL) 325 MG tablet Take 325-650 mg by mouth every 6 (six) hours as needed (pain/headaches.).     amoxicillin (AMOXIL) 500 MG capsule Take 500 mg by mouth every 8 (eight) hours.     carbidopa-levodopa (SINEMET IR) 25-100 MG tablet as directed.     cetirizine (ZYRTEC) 10 MG tablet Take 1 tablet (10 mg total) by mouth daily. 30 tablet 11   Cholecalciferol (VITAMIN D) 50 MCG (2000 UT) CAPS Take 2,000 Units by mouth in the morning.     clonazePAM (KLONOPIN) 1 MG disintegrating tablet Take 1 mg by mouth as directed. ALLOW 1 TABLET TO DISSOLVE IN CHEEK FOR SEIZURES LASTING LONGER THAN 4 MINUTES. OR AFTER 2ND SEIZURE IN 24 HOURS PRN     donepezil (ARICEPT) 10 MG tablet Take  half a tablet in the morning for a month and then increase to 1 full tablet daily. 30 tablet 5   finasteride (PROSCAR) 5 MG tablet Take 5 mg by mouth daily.     ipratropium (ATROVENT) 0.06 % nasal spray Place 2 sprays into the nose as needed for rhinitis. 15 mL 5   levETIRAcetam (KEPPRA) 500 MG tablet Take 3 tablets by mouth in the morning and at bedtime.     loperamide (IMODIUM) 2 MG capsule Take 1 capsule (2 mg total) by mouth as needed for diarrhea or loose stools. 30 capsule 5   LORazepam (ATIVAN) 1 MG tablet Take 1 mg by mouth every 6 (six) hours as needed for seizure.      metoprolol succinate (TOPROL-XL) 25 MG 24 hr tablet TAKE 1 TABLET (25 MG TOTAL) BY MOUTH DAILY. 90 tablet 3   Midazolam (NAYZILAM) 5 MG/0.1ML SOLN Place 1 each into the nose as needed (seizure).     midodrine (PROAMATINE) 2.5 MG tablet Take 1 tablet (2.5 mg total) by mouth daily. 270 tablet 3   mupirocin ointment (BACTROBAN) 2 % Apply 1 Application topically daily. 30 g 2   nitroGLYCERIN (NITROSTAT) 0.4 MG SL tablet PLACE 1 TABLET (0.4 MG TOTAL) UNDER THE TONGUE EVERY 5 (FIVE) MINUTES AS NEEDED FOR CHEST PAIN. 25 tablet 11   nystatin ointment (MYCOSTATIN) Apply 1 Application topically 2 (two) times daily. 60 g 3   Propylene Glycol (SYSTANE BALANCE) 0.6 % SOLN Apply 1 drop to eye daily as needed (dry eyes).     pyridOXINE (VITAMIN B-6) 100 MG tablet Take 100 mg by mouth in the morning.  rosuvastatin (CRESTOR) 5 MG tablet Take 1 tablet (5 mg total) by mouth 3 (three) times a week. 45 tablet 3   Tamsulosin HCl (FLOMAX) 0.4 MG CAPS Take 0.4 mg by mouth every evening.     thiamine 100 MG tablet Take 100 mg by mouth in the morning.     torsemide (DEMADEX) 20 MG tablet Take 1 tablet (20 mg) by mouth alternating with 1/2 tablet (10 mg) by mouth every other day. 135 tablet 3   triamcinolone cream (KENALOG) 0.5 % Apply 1 Application topically as needed (break out prevention).     VIMPAT 200 MG TABS tablet Take 200 mg by mouth 2  (two) times daily.     vitamin B-12 (CYANOCOBALAMIN) 1000 MCG tablet Take 1,000 mcg by mouth in the morning.     No current facility-administered medications on file prior to visit.    Allergies  Allergen Reactions   Haloperidol Rash and Other (See Comments)    Hallucinations, agitation    Rosuvastatin     Leg pain mainly in knees Other reaction(s): Generalized aches and pains (currently tolerates in low doses)   Simvastatin Nausea And Vomiting    2007: nausea & vomiting ; [facility] ( he does not remember such)   Lisinopril Cough   Nifedipine Swelling    Thought to contribute to lower extremity edema    Pravastatin     Rxed by [facility] 2009; "myositis" Ok to take per pt's wife/Dr. Alwyn Ren Other reaction(s): Myositis     Assessment/Plan:  1. Weight loss/CV risk reduction - Patient, daughter and wife and I had a long conversation about risks and benefits of 50. We discussed my concerns for possible GI side effects. In one way I do think medication could be beneficial if it decreases his desire to snack in the middle of the night. However, I also fear that his memory impairment may over come this and he could overeat and become sick. I do worry a little about diarrhea and him becoming dehydrated or throwing up an aspirating. All of these concerns were reviewed.  We talked about the 1.5 ARR in CV events seen in trial. Pt w/ hx of aneurysm and stroke.  Decreased weight may help with his breathing and arthritis. May not help with balance as much.  Will first see if insurance will cover. Then daughter and wife will make decision based on our discussion.  Thank you,  Olene Floss, Pharm.D, BCACP, BCPS, CPP [facility] Division of  [facility] [address], [place], [place] [address]  Phone: [phone]; Fax: [phone]

## 2023-04-26 ENCOUNTER — Ambulatory Visit: Payer: Medicare Other

## 2023-04-26 ENCOUNTER — Telehealth: Payer: Self-pay | Admitting: Pharmacist

## 2023-04-26 ENCOUNTER — Ambulatory Visit: Payer: Medicare Other | Admitting: Occupational Therapy

## 2023-04-26 DIAGNOSIS — R41840 Attention and concentration deficit: Secondary | ICD-10-CM

## 2023-04-26 DIAGNOSIS — R293 Abnormal posture: Secondary | ICD-10-CM

## 2023-04-26 DIAGNOSIS — R2681 Unsteadiness on feet: Secondary | ICD-10-CM | POA: Diagnosis not present

## 2023-04-26 DIAGNOSIS — I69918 Other symptoms and signs involving cognitive functions following unspecified cerebrovascular disease: Secondary | ICD-10-CM

## 2023-04-26 DIAGNOSIS — M6281 Muscle weakness (generalized): Secondary | ICD-10-CM | POA: Diagnosis not present

## 2023-04-26 DIAGNOSIS — R2689 Other abnormalities of gait and mobility: Secondary | ICD-10-CM | POA: Diagnosis not present

## 2023-04-26 DIAGNOSIS — R471 Dysarthria and anarthria: Secondary | ICD-10-CM | POA: Diagnosis not present

## 2023-04-26 MED ORDER — MIDODRINE HCL 2.5 MG PO TABS: ORAL_TABLET | ORAL | 11 refills | Status: DC

## 2023-04-26 MED ORDER — METOPROLOL SUCCINATE ER 25 MG PO TB24: 12.5000 mg | ORAL_TABLET | Freq: Every day | ORAL | 3 refills | Status: DC

## 2023-04-26 NOTE — Telephone Encounter (Signed)
Returned call to wife and she checked his Metoprolol bottle at home and verified it states "Metoprolol Succinate 25mg  daily", but he has only been taking 1/2 tablet daily. I will update chart here to reflect the 12.5mg  total daily dose. Amoxicillin removed from chart as wife states he was only using it prior to dental work and per [employer], this is not required, wife verified they are not taking this at home and doesn't have any on hand. Midodrine rx updated to reflect as needed only-wife states he hasn't taken any since office visit and has been doing good.

## 2023-04-26 NOTE — Telephone Encounter (Signed)
PA for [facility] approved Key: [facility] through 04/25/24 I spoke with patient's wife Asher Muir). Confirmed there is no hx or fhx of MENS2, MTC or pancreatitis that she is aware of. He has had gallstones, but gallbladder has been removed. Aware that there is an increased risk of this. She will discuss with pt and daughter and will let me know what they decide.   Cost would be $38/month at express script or $43/month at retail
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
## 2023-04-26 NOTE — Therapy (Signed)
OUTPATIENT OCCUPATIONAL THERAPY NEURO  Treatment Note  Patient Name: Jeff Weaver MRN: [id number] DOB:[birth year], 77 y.o., male Today's Date: 04/26/2023  PCP: Pincus Sanes, MD REFERRING PROVIDER: Pincus Sanes, MD  END OF SESSION:  OT End of Session - 04/26/23 1543     Visit Number 9    Number of Visits 17    Date for OT Re-Evaluation 05/12/23    Authorization Type Medicare A&B    OT Start Time 1454   arrival time   OT Stop Time 1530    OT Time Calculation (min) 36 min                     Past Medical History:  Diagnosis Date   Aortic insufficiency    Atypical chest pain    Cardiac resynchronization therapy pacemaker (CRT-P) in place    Cerebral aneurysm    a. s/p clipping at [facility] in 8/13 c/b short-term memory loss, cerebral hemorrhage and seizure disorder s/p VP shunt.   Cerebral hemorrhage (HCC)    Chronic combined systolic and diastolic CHF (congestive heart failure) (HCC)    CVA (cerebral infarction)    Diverticulosis    Enteritis (regional)    Dr Juanda Chance   First degree AV block    GI bleed    HTN (hypertension)    Hyperlipidemia    Hypertensive heart disease    IBS (irritable bowel syndrome)    Lewy body dementia (HCC)    Mild CAD    Cardiac cath 01/12/18 showed minimal nonobstructive CAD, normal LVEF, normal LVEDP.   Mild dilation of ascending aorta (HCC)    Mitral regurgitation    NSVT (nonsustained ventricular tachycardia) (HCC)    Orthostatic hypotension    Pacemaker    Pre-diabetes    PVC's (premature ventricular contractions)    Regional enteritis of large intestine (HCC) since 1978   Rheumatoid arthritis(714.0)    dxed in [employer] in 1980s   Second degree AV block, Mobitz type I    Seizures (HCC)    last April 26,2021   Sinus bradycardia    Sleep apnea    SSS (sick sinus syndrome) (HCC)    Subdural hematoma (HCC)    Past Surgical History:  Procedure Laterality Date   BIV PACEMAKER INSERTION CRT-P N/A 03/10/2021   Procedure: BIV  PACEMAKER INSERTION CRT-P UPGRADE;  Surgeon: Regan Lemming, MD;  Location: [facility] INVASIVE CV LAB;  Service: Cardiovascular;  Laterality: N/A;   CHOLECYSTECTOMY N/A 01/07/2016   Procedure: LAPAROSCOPIC CHOLECYSTECTOMY ;  Surgeon: Abigail Miyamoto, MD;  Location: [facility] OR;  Service: General;  Laterality: N/A;   cns shunt  02/23/12   COLONOSCOPY W/ POLYPECTOMY  1978   negative 2009, Dr Juanda Chance. Due 2014   CRANIOTOMY  02/02/12   Dr Kelby Aline, [facility]-clipping of aneurysm   CRANIOTOMY  02-02-12   left pterional craniotomy for clipping complex anterior communicating artery aneurysm    HERNIA REPAIR     LEFT HEART CATH AND CORONARY ANGIOGRAPHY N/A 01/12/2018   Procedure: LEFT HEART CATH AND CORONARY ANGIOGRAPHY;  Surgeon: Swaziland, Peter M, MD;  Location: [facility] INVASIVE CV LAB;  Service: Cardiovascular;  Laterality: N/A;   LEFT HEART CATHETERIZATION WITH CORONARY ANGIOGRAM N/A 11/26/2013   Procedure: LEFT HEART CATHETERIZATION WITH CORONARY ANGIOGRAM;  Surgeon: Marykay Lex, MD;  Location: [facility] CATH LAB;  Service: Cardiovascular;  Laterality: N/A;   NOSE SURGERY     PACEMAKER IMPLANT N/A 07/02/2018   Procedure: PACEMAKER IMPLANT;  Surgeon: Elberta Fortis, Will  Daphine Deutscher, MD;  Location: [facility] INVASIVE CV LAB;  Service: Cardiovascular;  Laterality: N/A;   SHOULDER SURGERY  1997   TONSILLECTOMY AND ADENOIDECTOMY     VENTRICULOPERITONEAL SHUNT  02-23-12   INSERTION OF RIGHT FRONTAL VENTRICULOPERITONEAL SHUNT WITH A CODMAN HAKIM PROGRAMMABLE VALVE   Patient Active Problem List   Diagnosis Date Noted   Benign paroxysmal positional vertigo of right ear 03/03/2023   COVID-19 03/03/2023   Abrasion, forearm with infection, left, initial encounter 03/03/2023   Physical deconditioning 12/20/2022   Poor balance 12/20/2022   PVC's (premature ventricular contractions) 09/19/2022   Diplopia 09/19/2022   Parkinsonism (HCC) 06/19/2022   Primary localized osteoarthrosis of shoulder region 05/10/2022   Recurrent dislocation of shoulder  joint 05/10/2022   Regional enteritis (HCC) 05/10/2022   Vestibular hypofunction of left ear 05/10/2022   Sensorineural hearing loss 05/10/2022   Senile osteoporosis 02/03/2022   Pain in right hip 01/05/2022   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   Intertrigo 10/01/2021   HFrEF (heart failure with reduced ejection fraction) (HCC) 09/18/2021   Blepharitis of upper and lower eyelids of both eyes 09/18/2021   Tinea cruris 09/18/2021   Lewy body dementia (HCC) 09/16/2021   Closed fracture of right hip (HCC) 09/16/2021   S/P hip replacement, right  07/2021 09/16/2021   Presence of right artificial hip joint 08/06/2021   Vascular dementia (HCC) 04/29/2021   Multiple falls 04/29/2021   Cough 04/09/2021   Vitamin D deficiency 04/09/2021   Vitamin B1 deficiency 04/09/2021   Right arm pain 01/06/2021   Seizures (HCC)    Muscle cramping 12/30/2019   Dizziness 12/30/2019   Unsteady gait 12/30/2019   Pacemaker 11/27/2019   Fatigue 07/09/2019   SSS (sick sinus syndrome) (HCC) 07/02/2018   Bilateral shoulder pain 06/14/2018   Upper airway cough syndrome vs cough variant asthma 03/20/2018   Acute midline low back pain without sciatica 12/25/2017   Depression 10/17/2017   CAD (coronary artery disease), hx of NSTEMI 08/15/2017   Chest pain 07/17/2017   SOBOE (shortness of breath on exertion) 06/08/2017   Bilateral sensorineural hearing loss 06/01/2017   Nasal turbinate hypertrophy 06/01/2017   Dysphagia 05/31/2017   Osteoporosis 01/30/2017   History of cerebral hemorrhage 12/28/2016   Seborrheic dermatitis 07/25/2016   Cognitive communication deficit 06/28/2016   Hypertensive heart disease 06/28/2016   Vitamin B 12 deficiency 03/16/2016   Confusion 03/13/2016   VP (ventriculoperitoneal) shunt status 02/12/2016   Orthostatic hypotension 01/05/2016   Benign prostatic hyperplasia with urinary obstruction 08/04/2014   Urinary urgency 08/04/2014   Gastroesophageal reflux disease 07/14/2014    Polymyalgia rheumatica (HCC) 05/28/2014   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/2014   SBE (subacute bacterial endocarditis) prophylaxis candidate 11/24/2013   Aphasia 07/17/2013   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 seizures, not intractable, without status epilepticus (HCC) 07/17/2013   Subdural hemorrhage (HCC) 07/17/2013   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HCC) 07/17/2013   Pre-diabetes 03/26/2013   Urinary frequency 10/31/2012   Nocturia 10/31/2012   History of cerebral aneurysm repair 10/18/2012   History of stroke without residual deficits 10/18/2012   Ocular rosacea 09/27/2012   Seizure disorder (HCC) 07/23/2012   Entropion 07/10/2012   Hyperopia with astigmatism 07/10/2012   Combined senile cataract 04/13/2012   Presbyopia 04/13/2012   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) 03/01/2012   Stroke (HCC) 03/01/2012   Cognitive safety issue 02/20/2012   Nonruptured cerebral aneurysm 11/23/2011   Obstructive sleep apnea syndrome 08/09/2011   Hyperlipidemia 02/03/2009  HTN (hypertension) 02/03/2009   History of Crohn's disease 04/17/2008   Allergic rhinitis, unspecified 07/05/1999   Cataract, right eye 07/05/1999    ONSET DATE: referral date 02/21/23  REFERRING DIAG: R41.841 (ICD-10-CM) - Cognitive communication deficit G31.83,F02.80 (ICD-10-CM) - Lewy body dementia, unspecified dementia severity, unspecified whether behavioral, psychotic, or mood disturbance or anxiety (HCC) I63.512 (ICD-10-CM) - 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  Rationale for Evaluation and Treatment: Rehabilitation  SUBJECTIVE:   SUBJECTIVE STATEMENT: Pt reports "I'm pretty fair today, which for me is a good day." Pt accompanied by: self and family member (spouse)  PERTINENT HISTORY: Aortic insufficiency, CRT-P pacemaker  placement, cerebral aneurysm s/p clipping, cerebral hemorrhage and seizure disorder s/p VP shunt, CHF, CVA, second degree AV block Mobitz type 1 s/p PPM, HTN, HLD, hypertensive heart disease, IBS, lew body dementia, mitral regurgitation, NSVT, orthostatic hypotension, RA, SSS, subdural hematoma, hernia repair, cardiac cath  PRECAUTIONS: Fall, ICD/Pacemaker, and Other: poor vision, STM impairments  WEIGHT BEARING RESTRICTIONS: No  PAIN:  Are you having pain? Yes: NPRS scale: 1-2/10 Pain location: B knees R > L Pain description: aching, discomfort Aggravating factors: prolonged sitting Relieving factors: movement, voltaren  FALLS: Has patient fallen in last 6 months? Yes. Number of falls 2-3, getting in/out of bed or when turning to pick something up   LIVING ENVIRONMENT: Lives with: lives with their family (daughter comes and goes, will spend a few days per week in the home with them). Lives in: House/apartment Stairs: Yes: Internal: 14 steps; and External: 1 step to enter home  Has following equipment at home: Dan Humphreys - 2 wheeled, Environmental consultant - 4 wheeled, Tour manager, and Grab bars  PLOF: Independent and Independent with basic ADLs  PATIENT GOALS: improve endurance and engagement in ADLs  OBJECTIVE:   HAND DOMINANCE: Right  ADLs: Transfers/ambulation related to ADLs: Mod I to supervision with RW Eating: spouse typically cuts foods for him, pt will lean towards food instead of bringing food to mouth, spoon is preferred utensil Grooming: increased time UB Dressing: increased time, is ableto manage buttons LB Dressing: wears disposable underwear and pull on pants for increased ease Toileting: Mod I, does have a grab bar in front that he will pull up on; difficulty with hygiene post BM Bathing: spouse assists with bathing, requires encouragement to bathe Tub Shower transfers: spouse assists with transfers Equipment: Transfer tub bench and Grab bars  IADLs: Medication management:  spouse fills pill box and will remind him to take  MOBILITY STATUS: Needs Assist: Mod I to Supervision with RW and Hx of falls; required min assist to stand from chair without arm rests  POSTURE COMMENTS:  rounded shoulders, forward head, decreased lumbar lordosis, increased thoracic kyphosis, and right head tilt  ACTIVITY TOLERANCE: Activity tolerance: diminished  UPPER EXTREMITY ROM:    Active ROM Right eval Left eval  Shoulder flexion 90 110  Shoulder abduction    Shoulder adduction    Shoulder extension    Shoulder internal rotation 80% 70%  Shoulder external rotation 80% 80%  Elbow flexion    Elbow extension    Wrist flexion    Wrist extension    Wrist ulnar deviation    Wrist radial deviation    Wrist pronation    Wrist supination    (Blank rows = not tested)  HAND FUNCTION: 03/29/23 Grip strength: Right: 45, 40, 36 lbs; Left:  41, 36, 33 lbs  COORDINATION: 03/22/23 9 Hole Peg test: Right: 53.19 sec; Left: 1:17.28 sec Box and Blocks:  Right 26 blocks, Left 23 blocks  COGNITION: Overall cognitive status: History of cognitive impairments - at baseline Brief Interview for Mental Status:   Memory:  Word Repetition:   "Sock"  "Blue"  "Bed"   Orientation Level:    Year:   2024  Month: Sept  Day of Week: Weds  Memory:  Recall:     Unable to recall "Sock"  with cue   Able to recall  "Blue"  without cue   Able to recall  "Bed"   with cue     No Recall   VISION: Subjective report: diplopia.  Recently went to ophthalmologist who applied a Fresnel prism 8 base in on the right lens of his readers. Baseline vision: Wears glasses for reading only  VISION ASSESSMENT: Impaired To be further assessed in functional context   TODAY'S TREATMENT:                                                                        DATE: 04/26/23 Dressing: pt reports putting on underwear is challenging, reports difficulty with button up shirts due to R shoulder issues.  Problem  solving with pt and spouse in regards to setup with positioning during LB dressing, particularly donning underwear.  OT making recommendation to stretch absorbent underwear prior to donning to allow separation of fabric to allow food to slide between front/back fabric.  Engaged in simulated LB dressing with theraband to simulate leg hole with pt pulling each leg up into figure 4 position to thread leg hole.  Pt utilizing pant leg to fully pull leg up into figure 4 position. Bathroom AE: educated on modifications to toilet seat to allow for increased UE support as needed with sit > stand and when weight shifting R/L as needed to don pants.  OT providing pictures of toilet safety frame with handles. Hip stretch: engaged in seated hip external rotation stretch bilaterally with 10-15 second hold x2.  Pt continues to require increased time to pull leg up into figure 4 position, but once there able to maintain position for stretch.  Encouraged performance of stretch to carryover to increased ease with LB dressing.   04/24/23 Bed mobility: engaged in transitional movements in/out of bed incorporating bridging and scooting in bed.  Pt with good sequencing with transition into bed, picking up feet and bridging to scoot with good efficiency.  Engaged in repetitions of  lifting buttocks and feet prior to scooting to facilitate increased clearance of buttocks during bed mobility.  Mod assist to sit to EOB - pt reports utilizing lift aspect of personal bed and railing on R to aid in transitioning to edge of bed.  Educated on loose sheet/blanket to decrease chances of getting feet twisted in covers.  Sit > stand: engaged in sit > stand x5 with pt demonstrating improved anterior weight shift and lift off into standing this session with pushing up from mat table (as no arm rests available).   Memory: OT encouraged pt to summarize focus of therapy session to allow for recall and carryover to home.  Pt with good immediate  recall, therapist adding additional  information to elaborate on intentional movements during mobility and self-care tasks.   04/19/23 Sit > stand: engaged in massed practice with sit > stand from arm chair.  Pt initially pushing up with BUE on arm rests but with massed practice pt able to stand with less reliance on UE support.  Educated on foot placement and wider BOS with sit > stand. Resistance Clothespins 1,2,4,6# with LUE for low and high functional reaching and sustained pinch; pt reaching down to low surface to pick up and then reach across midline to place on vertical dowel.  Pt then retrieving from high dowel and further crossing midline to place on lower surface. CGA for dynamic standing balance.  OT educating on functional carryover of task and importance of large amplitude with reaching.   PATIENT EDUCATION: Education details: ongoing condition specific education Person educated: Patient, Spouse, and Child(ren) Education method: Explanation Education comprehension: verbalized understanding and needs further education  HOME EXERCISE PROGRAM: Access Code: [email or web address] URL: [email or web address] Date: 04/26/2023 Prepared by: [facility] Outpatient  Re[facility] - [facility]  Exercises - Seated Shoulder Horizontal Abduction with Resistance  - 2 x daily - 10 reps - 3-5 sec hold - Seated Shoulder Diagonal with Resistance  - 2 x daily - 10 reps - 3-5 sec hold - Seated Active Figure 4 Hip Flexion and External Rotation  - 2 x daily - 5 reps - 2-3 sec hold   GOALS: Goals reviewed with patient? Yes  SHORT TERM GOALS: Target date: 04/14/23  Pt and caregiver will be independent with gentle ROM HEP for improved functional use of BUE. Baseline: Goal status: IN PROGRESS  2.  Pt will verbalize understanding of task modifications and/or potential AE needs to increase ease, safety, and independence w/ ADLs Baseline:  Goal status: IN PROGRESS  3.  Pt will verbalize  understanding of use of new glasses with Fresnel lenses for increased visual perception as needed for ADLs and IADLs. Baseline:  Goal status: IN PROGRESS   LONG TERM GOALS: Target date: 05/12/23  Pt will demonstrate and/or report ability to complete LB dressing to include footwear at Mod I level utilizing adaptive techniques and/or AE PRN. Baseline:  Goal status: IN PROGRESS  2.  Pt will be able to complete toileting hygiene at Mod I level with adaptive techniques and/or AE PRN. Baseline:  Goal status: IN PROGRESS  3.  Pt will demonstrate improved internal rotation as needed for clothing management and hygiene post toileting. Baseline:  Goal status: IN PROGRESS  4.  Pt will demonstrate improved sustained attention to task with ability to complete table top task for 10 mins with 1 or fewer cues for attention to task. Baseline:  Goal status: IN PROGRESS  ASSESSMENT:  CLINICAL IMPRESSION: Pt reports difficulty with LB dressing, demonstrating difficulty with simulated tasks and ability to carry over simulated tasks in therapy session to functional tasks at home.  Pt continues to benefit from massed practice and demonstration for increased understanding and carryover.  Pt benefiting from increased time for processing both of instructions as well as when describing bathroom and/or LB dressing routine.  PERFORMANCE DEFICITS: in functional skills including ADLs, IADLs, ROM, pain, flexibility, Gross motor control, balance, body mechanics, endurance, and decreased knowledge of use of DME, cognitive skills including attention, memory, problem solving, and safety awareness, and psychosocial skills including coping strategies, environmental adaptation, and habits.     PLAN:  OT FREQUENCY: 2x/week  OT DURATION: 8 weeks  PLANNED INTERVENTIONS: self care/ADL training, therapeutic exercise,  therapeutic activity, neuromuscular re-education, passive range of motion, balance training, functional  mobility training, ultrasound, compression bandaging, moist heat, cryotherapy, patient/family education, cognitive remediation/compensation, visual/perceptual remediation/compensation, psychosocial skills training, energy conservation, coping strategies training, and DME and/or AE instructions  RECOMMENDED OTHER SERVICES: NA  CONSULTED AND AGREED WITH PLAN OF CARE: Patient and family member/caregiver  PLAN FOR NEXT SESSION: adaptive techniques for LB dressing, bed mobility (particularly feet placement when getting in/out of bed and standing from EOB), review bag exercises, sit > stand, grip and UB strengthening.  Educate on memory strategies and implementing into home.   Rosalio Loud, OTR/L 04/26/2023, 3:43 PM

## 2023-04-26 NOTE — Addendum Note (Signed)
Addended by: Lars Mage on: 04/26/2023 11:57 AM   Modules accepted: Orders

## 2023-05-01 ENCOUNTER — Ambulatory Visit: Payer: Medicare Other | Admitting: Occupational Therapy

## 2023-05-01 ENCOUNTER — Ambulatory Visit: Payer: Medicare Other

## 2023-05-03 ENCOUNTER — Ambulatory Visit: Payer: Medicare Other

## 2023-05-03 ENCOUNTER — Ambulatory Visit: Payer: Medicare Other | Admitting: Occupational Therapy

## 2023-05-03 DIAGNOSIS — R2681 Unsteadiness on feet: Secondary | ICD-10-CM

## 2023-05-03 DIAGNOSIS — R293 Abnormal posture: Secondary | ICD-10-CM

## 2023-05-03 DIAGNOSIS — R41840 Attention and concentration deficit: Secondary | ICD-10-CM

## 2023-05-03 DIAGNOSIS — R2689 Other abnormalities of gait and mobility: Secondary | ICD-10-CM

## 2023-05-03 DIAGNOSIS — I69918 Other symptoms and signs involving cognitive functions following unspecified cerebrovascular disease: Secondary | ICD-10-CM | POA: Diagnosis not present

## 2023-05-03 DIAGNOSIS — M6281 Muscle weakness (generalized): Secondary | ICD-10-CM

## 2023-05-03 DIAGNOSIS — R471 Dysarthria and anarthria: Secondary | ICD-10-CM | POA: Diagnosis not present

## 2023-05-03 DIAGNOSIS — R1310 Dysphagia, unspecified: Secondary | ICD-10-CM
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
## 2023-05-03 NOTE — Therapy (Addendum)
OUTPATIENT OCCUPATIONAL THERAPY NEURO  Treatment Note / Progress Note  Patient Name: Jeff Weaver MRN: [id number] DOB:[birth year], 77 y.o., male Today's Date: 05/03/2023  PCP: Pincus Sanes, MD REFERRING PROVIDER: Pincus Sanes, MD  END OF SESSION:  OT End of Session - 05/03/23 1606     Visit Number 10    Number of Visits 17    Date for OT Re-Evaluation 05/12/23    Authorization Type Medicare A&B    Progress Note Due on Visit 20    OT Start Time 1449    OT Stop Time 1535    OT Time Calculation (min) 46 min    Activity Tolerance --    Behavior During Therapy WFL for tasks assessed/performed                      Past Medical History:  Diagnosis Date   Aortic insufficiency    Atypical chest pain    Cardiac resynchronization therapy pacemaker (CRT-P) in place    Cerebral aneurysm    a. s/p clipping at [facility] in 8/13 c/b short-term memory loss, cerebral hemorrhage and seizure disorder s/p VP shunt.   Cerebral hemorrhage (HCC)    Chronic combined systolic and diastolic CHF (congestive heart failure) (HCC)    CVA (cerebral infarction)    Diverticulosis    Enteritis (regional)    Dr Juanda Chance   First degree AV block    GI bleed    HTN (hypertension)    Hyperlipidemia    Hypertensive heart disease    IBS (irritable bowel syndrome)    Lewy body dementia (HCC)    Mild CAD    Cardiac cath 01/12/18 showed minimal nonobstructive CAD, normal LVEF, normal LVEDP.   Mild dilation of ascending aorta (HCC)    Mitral regurgitation    NSVT (nonsustained ventricular tachycardia) (HCC)    Orthostatic hypotension    Pacemaker    Pre-diabetes    PVC's (premature ventricular contractions)    Regional enteritis of large intestine (HCC) since 1978   Rheumatoid arthritis(714.0)    dxed in [employer] in 1980s   Second degree AV block, Mobitz type I    Seizures (HCC)    last April 26,2021   Sinus bradycardia    Sleep apnea    SSS (sick sinus syndrome) (HCC)    Subdural hematoma  (HCC)    Past Surgical History:  Procedure Laterality Date   BIV PACEMAKER INSERTION CRT-P N/A 03/10/2021   Procedure: BIV PACEMAKER INSERTION CRT-P UPGRADE;  Surgeon: Regan Lemming, MD;  Location: [facility] INVASIVE CV LAB;  Service: Cardiovascular;  Laterality: N/A;   CHOLECYSTECTOMY N/A 01/07/2016   Procedure: LAPAROSCOPIC CHOLECYSTECTOMY ;  Surgeon: Abigail Miyamoto, MD;  Location: [facility] OR;  Service: General;  Laterality: N/A;   cns shunt  02/23/12   COLONOSCOPY W/ POLYPECTOMY  1978   negative 2009, Dr Juanda Chance. Due 2014   CRANIOTOMY  02/02/12   Dr Kelby Aline, [facility]-clipping of aneurysm   CRANIOTOMY  02-02-12   left pterional craniotomy for clipping complex anterior communicating artery aneurysm    HERNIA REPAIR     LEFT HEART CATH AND CORONARY ANGIOGRAPHY N/A 01/12/2018   Procedure: LEFT HEART CATH AND CORONARY ANGIOGRAPHY;  Surgeon: Swaziland, Peter M, MD;  Location: [facility] INVASIVE CV LAB;  Service: Cardiovascular;  Laterality: N/A;   LEFT HEART CATHETERIZATION WITH CORONARY ANGIOGRAM N/A 11/26/2013   Procedure: LEFT HEART CATHETERIZATION WITH CORONARY ANGIOGRAM;  Surgeon: Marykay Lex, MD;  Location: [facility] CATH LAB;  Service: Cardiovascular;  Laterality: N/A;   NOSE SURGERY     PACEMAKER IMPLANT N/A 07/02/2018   Procedure: PACEMAKER IMPLANT;  Surgeon: Regan Lemming, MD;  Location: [facility] INVASIVE CV LAB;  Service: Cardiovascular;  Laterality: N/A;   SHOULDER SURGERY  1997   TONSILLECTOMY AND ADENOIDECTOMY     VENTRICULOPERITONEAL SHUNT  02-23-12   INSERTION OF RIGHT FRONTAL VENTRICULOPERITONEAL SHUNT WITH A CODMAN HAKIM PROGRAMMABLE VALVE   Patient Active Problem List   Diagnosis Date Noted   Benign paroxysmal positional vertigo of right ear 03/03/2023   COVID-19 03/03/2023   Abrasion, forearm with infection, left, initial encounter 03/03/2023   Physical deconditioning 12/20/2022   Poor balance 12/20/2022   PVC's (premature ventricular contractions) 09/19/2022   Diplopia 09/19/2022    Parkinsonism (HCC) 06/19/2022   Primary localized osteoarthrosis of shoulder region 05/10/2022   Recurrent dislocation of shoulder joint 05/10/2022   Regional enteritis (HCC) 05/10/2022   Vestibular hypofunction of left ear 05/10/2022   Sensorineural hearing loss 05/10/2022   Senile osteoporosis 02/03/2022   Pain in right hip 01/05/2022   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   Intertrigo 10/01/2021   HFrEF (heart failure with reduced ejection fraction) (HCC) 09/18/2021   Blepharitis of upper and lower eyelids of both eyes 09/18/2021   Tinea cruris 09/18/2021   Lewy body dementia (HCC) 09/16/2021   Closed fracture of right hip (HCC) 09/16/2021   S/P hip replacement, right  07/2021 09/16/2021   Presence of right artificial hip joint 08/06/2021   Vascular dementia (HCC) 04/29/2021   Multiple falls 04/29/2021   Cough 04/09/2021   Vitamin D deficiency 04/09/2021   Vitamin B1 deficiency 04/09/2021   Right arm pain 01/06/2021   Seizures (HCC)    Muscle cramping 12/30/2019   Dizziness 12/30/2019   Unsteady gait 12/30/2019   Pacemaker 11/27/2019   Fatigue 07/09/2019   SSS (sick sinus syndrome) (HCC) 07/02/2018   Bilateral shoulder pain 06/14/2018   Upper airway cough syndrome vs cough variant asthma 03/20/2018   Acute midline low back pain without sciatica 12/25/2017   Depression 10/17/2017   CAD (coronary artery disease), hx of NSTEMI 08/15/2017   Chest pain 07/17/2017   SOBOE (shortness of breath on exertion) 06/08/2017   Bilateral sensorineural hearing loss 06/01/2017   Nasal turbinate hypertrophy 06/01/2017   Dysphagia 05/31/2017   Osteoporosis 01/30/2017   History of cerebral hemorrhage 12/28/2016   Seborrheic dermatitis 07/25/2016   Cognitive communication deficit 06/28/2016   Hypertensive heart disease 06/28/2016   Vitamin B 12 deficiency 03/16/2016   Confusion 03/13/2016   VP (ventriculoperitoneal) shunt status 02/12/2016   Orthostatic hypotension 01/05/2016   Benign  prostatic hyperplasia with urinary obstruction 08/04/2014   Urinary urgency 08/04/2014   Gastroesophageal reflux disease 07/14/2014   Polymyalgia rheumatica (HCC) 05/28/2014   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/2014   SBE (subacute bacterial endocarditis) prophylaxis candidate 11/24/2013   Aphasia 07/17/2013   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 seizures, not intractable, without status epilepticus (HCC) 07/17/2013   Subdural hemorrhage (HCC) 07/17/2013   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HCC) 07/17/2013   Pre-diabetes 03/26/2013   Urinary frequency 10/31/2012   Nocturia 10/31/2012   History of cerebral aneurysm repair 10/18/2012   History of stroke without residual deficits 10/18/2012   Ocular rosacea 09/27/2012   Seizure disorder (HCC) 07/23/2012   Entropion 07/10/2012   Hyperopia with astigmatism 07/10/2012   Combined senile cataract 04/13/2012   Presbyopia 04/13/2012   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) 03/01/2012   Stroke (HCC)  03/01/2012   Cognitive safety issue 02/20/2012   Nonruptured cerebral aneurysm 11/23/2011   Obstructive sleep apnea syndrome 08/09/2011   Hyperlipidemia 02/03/2009   HTN (hypertension) 02/03/2009   History of Crohn's disease 04/17/2008   Allergic rhinitis, unspecified 07/05/1999   Cataract, right eye 07/05/1999    ONSET DATE: referral date 02/21/23  REFERRING DIAG: R41.841 (ICD-10-CM) - Cognitive communication deficit G31.83,F02.80 (ICD-10-CM) - Lewy body dementia, unspecified dementia severity, unspecified whether behavioral, psychotic, or mood disturbance or anxiety (HCC) I63.512 (ICD-10-CM) - 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   Pt's spouse reports pt sometimes carries walker when ambulating.  Pt accompanied by: self and family member (spouse, daughter)  PERTINENT HISTORY: Aortic insufficiency, CRT-P pacemaker placement, cerebral aneurysm s/p clipping, cerebral hemorrhage and seizure disorder s/p VP shunt, CHF, CVA, second degree AV block Mobitz type 1 s/p PPM, HTN, HLD, hypertensive heart disease, IBS, lew body dementia, mitral regurgitation, NSVT, orthostatic hypotension, RA, SSS, subdural hematoma, hernia repair, cardiac cath  PRECAUTIONS: Fall, ICD/Pacemaker, and Other: poor vision, STM impairments  WEIGHT BEARING RESTRICTIONS: No  PAIN:  Are you having pain? Yes: NPRS scale: 6/10 Pain location: lower back, neck, B knees R = L Pain description: aching, discomfort Aggravating factors: inactivity Relieving factors: Tylenol  FALLS: Has patient fallen in last 6 months? Yes. Number of falls 2-3, getting in/out of bed or when turning to pick something up   LIVING ENVIRONMENT: Lives with: lives with their family (daughter comes and goes, will spend a few days per week in the home with them). Lives in: House/apartment Stairs: Yes: Internal: 14 steps; and External: 1 step to enter home  Has following equipment at home: Dan Humphreys - 2 wheeled, Environmental consultant - 4 wheeled, Tour manager, and Grab bars  PLOF: Independent and Independent with basic ADLs  PATIENT GOALS: improve endurance and engagement in ADLs  OBJECTIVE:   HAND DOMINANCE: Right  ADLs: Transfers/ambulation related to ADLs: Mod I to supervision with RW Eating: spouse typically cuts foods for him, pt will lean towards food instead of  bringing food to mouth, spoon is preferred utensil Grooming: increased time UB Dressing: increased time, is ableto manage buttons LB Dressing: wears disposable underwear and pull on pants for increased ease Toileting: Mod I, does have a grab bar in front that he will pull up on; difficulty with hygiene post BM Bathing: spouse assists with bathing, requires encouragement to bathe Tub Shower transfers: spouse assists with transfers Equipment: Transfer tub bench and Grab bars  IADLs: Medication management: spouse fills pill box and will remind him to take  MOBILITY STATUS: Needs Assist: Mod I to Supervision with RW and Hx of falls; required min assist to stand from chair without arm rests  POSTURE COMMENTS:  rounded shoulders, forward head, decreased lumbar lordosis, increased thoracic kyphosis, and right head tilt  ACTIVITY TOLERANCE: Activity tolerance: diminished  UPPER EXTREMITY ROM:    Active ROM Right eval Left eval  Shoulder flexion 90 110  Shoulder abduction  Shoulder adduction    Shoulder extension    Shoulder internal rotation 80% 70%  Shoulder external rotation 80% 80%  Elbow flexion    Elbow extension    Wrist flexion    Wrist extension    Wrist ulnar deviation    Wrist radial deviation    Wrist pronation    Wrist supination    (Blank rows = not tested)  HAND FUNCTION: 03/29/23 Grip strength: Right: 45, 40, 36 lbs; Left: 41, 36, 33 lbs  COORDINATION: 03/22/23 9 Hole Peg test: Right: 53.19 sec; Left: 1:17.28 sec Box and Blocks:  Right 26 blocks, Left 23 blocks  COGNITION: Overall cognitive status: History of cognitive impairments - at baseline Brief Interview for Mental Status:   Memory:  Word Repetition:   "Sock"  "Blue"  "Bed"   Orientation Level:    Year:   2024  Month: Sept  Day of Week: Weds  Memory:  Recall:     Unable to recall "Sock"  with cue   Able to recall  "Blue"  without cue   Able to recall  "Bed"   with cue     No  Recall   VISION: Subjective report: diplopia.  Recently went to ophthalmologist who applied a Fresnel prism 8 base in on the right lens of his readers. Baseline vision: Wears glasses for reading only  VISION ASSESSMENT: Impaired To be further assessed in functional context   TODAY'S TREATMENT:                                                                        DATE:  05/03/23 Therapeutic Activities OT assessed progress towards pt's progress towards goals for 10th progress note today. See "goals" section below for details. OT provided pt and caregiver education regarding rolling table options with brakes, reducing fall risk, keeping walker on floor at all times, keeping walker "in field of vision" to remind pt to bring walker. Pt and caregivers verbalized understanding. However, pt demo'd some frustration with using walker consistently as evidenced by statements: "Sometimes I walk to the bathroom and I realize I forgot it," and "Where am I supposed to read my papers if my walker is always in front of me?" OT provided verbal reassurance. OT and pt discussed keeping walker nearby and in field of vision at all times when seated and standing as visual cue to take walker when ambulating. Pt agreed and acknowledged understanding. Self-care - Dressing - to increase ind with LB dressing tasks: Pt reported difficulty with moving LUE for piriformis stretch and for LB dressing tasks. Pt ind lifted R leg using pants to lift leg. Therefore, OT provided education to pt and caregivers on using pillowcase or towel just proximal to ankle to lift L leg for piriformis stretch. OT provided tactile cues, v/c, and min assist for placement of pillowcase. Pt demo'd understanding and caregivers acknowledged understanding. Pt c/o difficulty with socks falling down after walking and low back pain when bending at waist to adjust socks. OT educated pt and caregivers on use of dressing stick and reacher to adjust socks to  decrease pain and to increase ind. Pt participated in trial of using dressing stick and reacher though required significant cues likely d/t unfamiliar task.  Therefore, OT recommended continuing to practice crossing legs for LB dressing using pillowcase PRN to lift legs. Pt and caregivers verbalized understanding.  04/26/23 Dressing: pt reports putting on underwear is challenging, reports difficulty with button up shirts due to R shoulder issues.  Problem solving with pt and spouse in regards to setup with positioning during LB dressing, particularly donning underwear.  OT making recommendation to stretch absorbent underwear prior to donning to allow separation of fabric to allow food to slide between front/back fabric.  Engaged in simulated LB dressing with theraband to simulate leg hole with pt pulling each leg up into figure 4 position to thread leg hole.  Pt utilizing pant leg to fully pull leg up into figure 4 position. Bathroom AE: educated on modifications to toilet seat to allow for increased UE support as needed with sit > stand and when weight shifting R/L as needed to don pants.  OT providing pictures of toilet safety frame with handles. Hip stretch: engaged in seated hip external rotation stretch bilaterally with 10-15 second hold x2.  Pt continues to require increased time to pull leg up into figure 4 position, but once there able to maintain position for stretch.  Encouraged performance of stretch to carryover to increased ease with LB dressing.   04/24/23 Bed mobility: engaged in transitional movements in/out of bed incorporating bridging and scooting in bed.  Pt with good sequencing with transition into bed, picking up feet and bridging to scoot with good efficiency.  Engaged in repetitions of  lifting buttocks and feet prior to scooting to facilitate increased clearance of buttocks during bed mobility.  Mod assist to sit to EOB - pt reports utilizing lift aspect of personal bed and railing  on R to aid in transitioning to edge of bed.  Educated on loose sheet/blanket to decrease chances of getting feet twisted in covers.  Sit > stand: engaged in sit > stand x5 with pt demonstrating improved anterior weight shift and lift off into standing this session with pushing up from mat table (as no arm rests available).   Memory: OT encouraged pt to summarize focus of therapy session to allow for recall and carryover to home.  Pt with good immediate recall, therapist adding additional information to elaborate on intentional movements during mobility and self-care tasks.    PATIENT EDUCATION: Education details: purpose of progress note, assessment of goals, see treatment note above for additional details Person educated: Patient, Spouse, and Child(ren) Education method: Explanation Education comprehension: verbalized understanding, returned demonstration, verbal cues required, tactile cues required, and needs further education  HOME EXERCISE PROGRAM: Access Code: [email or web address] URL: [email or web address] Date: 04/26/2023 Prepared by: [facility] - Outpatient  Rehab - [facility]  Exercises - Seated Shoulder Horizontal Abduction with Resistance  - 2 x daily - 10 reps - 3-5 sec hold - Seated Shoulder Diagonal with Resistance  - 2 x daily - 10 reps - 3-5 sec hold - Seated Active Figure 4 Hip Flexion and External Rotation  - 2 x daily - 5 reps - 2-3 sec hold   GOALS: Goals reviewed with patient? Yes  SHORT TERM GOALS: Target date: 04/14/23  Pt and caregiver will be independent with gentle ROM HEP for improved functional use of BUE. Baseline: Goal status: IN PROGRESS 05/03/23 - Pt required modeling, v/c and tactile cues for HEP. Caregivers ind demo'd understanding of ROM/UE HEP.   2.  Pt will verbalize understanding of task modifications and/or potential AE needs to increase ease, safety, and independence  w/ ADLs Baseline:  Goal status: IN PROGRESS 05/03/23 - Pt's spouse  reported using transfer tub bench for bathing to increase ind.  3.  Pt will verbalize understanding of use of new glasses with Fresnel lenses for increased visual perception as needed for ADLs and IADLs. Baseline:  Goal status: IN PROGRESS 05/03/23 - Pt reported lenses have not been processed so glasses not yet available. Pt's daughter reported a temporary pair was provided and pt is testing them for reading.   LONG TERM GOALS: Target date: 05/12/23  Pt will demonstrate and/or report ability to complete LB dressing to include footwear at Mod I level utilizing adaptive techniques and/or AE PRN. Baseline:  Goal status: IN PROGRESS 05/03/23 - Pt sits on bench and "works leg up" to put shoes on. Pt demo's increased difficulty with moving LUE compared to RUE.  2.  Pt will be able to complete toileting hygiene at Mod I level with adaptive techniques and/or AE PRN. Baseline:  Goal status: MET 05/03/23 - Pt's spouse reports pt completes toileting hygiene ind using grab bar and walker for balance support.   3.  Pt will demonstrate improved internal rotation as needed for clothing management and hygiene post toileting. Baseline:  Goal status: MET 05/03/23 - Pt demo's good BUE internal rotation to reach to low back. Pt reported improved ability to don shirt when putting R sleeve first and then using internal rotation of LUE to pull shirt around to L side.  4.  Pt will demonstrate improved sustained attention to task with ability to complete table top task for 10 mins with 1 or fewer cues for attention to task. Baseline:  Goal status: IN PROGRESS 05/03/23 - Pt participated in tasks with mod v/c to attend to tasks.  ASSESSMENT:  CLINICAL IMPRESSION:  This 10th progress note is for dates: 03/15/23 to 05/03/2023. Pt has met 0 out of 3 STGs and 2 out of 4 LTGs. Pt making progress towards goals as expected and continues to benefit from skilled OT services in the outpatient setting to work towards  remaining goals or until max rehab potential is met.  Pt continues to benefit from v/c, tactile cues, and modeling to attend to tasks and participate in unfamiliar tasks.  PERFORMANCE DEFICITS: in functional skills including ADLs, IADLs, ROM, pain, flexibility, Gross motor control, balance, body mechanics, endurance, and decreased knowledge of use of DME, cognitive skills including attention, memory, problem solving, and safety awareness, and psychosocial skills including coping strategies, environmental adaptation, and habits.     PLAN:  OT FREQUENCY: 2x/week  OT DURATION: 8 weeks  PLANNED INTERVENTIONS: self care/ADL training, therapeutic exercise, therapeutic activity, neuromuscular re-education, passive range of motion, balance training, functional mobility training, ultrasound, compression bandaging, moist heat, cryotherapy, patient/family education, cognitive remediation/compensation, visual/perceptual remediation/compensation, psychosocial skills training, energy conservation, coping strategies training, and DME and/or AE instructions  RECOMMENDED OTHER SERVICES: NA  CONSULTED AND AGREED WITH PLAN OF CARE: Patient and family member/caregiver  PLAN FOR NEXT SESSION: Continue to address adaptive techniques for LB dressing, bed mobility (particularly feet placement when getting in/out of bed and standing from EOB), review bag exercises, sit > stand, grip and UB strengthening.  Educate on memory strategies and implementing into home.   Wynetta Emery, OTR/L 05/03/2023, 4:08 PM

## 2023-05-08 ENCOUNTER — Ambulatory Visit: Payer: Medicare Other | Attending: Internal Medicine

## 2023-05-08 ENCOUNTER — Ambulatory Visit: Payer: Medicare Other

## 2023-05-08 ENCOUNTER — Ambulatory Visit: Payer: Medicare Other | Admitting: Occupational Therapy

## 2023-05-08 DIAGNOSIS — I69918 Other symptoms and signs involving cognitive functions following unspecified cerebrovascular disease: Secondary | ICD-10-CM

## 2023-05-08 DIAGNOSIS — R41840 Attention and concentration deficit: Secondary | ICD-10-CM

## 2023-05-08 DIAGNOSIS — R2681 Unsteadiness on feet: Secondary | ICD-10-CM | POA: Diagnosis not present

## 2023-05-08 DIAGNOSIS — R1310 Dysphagia, unspecified: Secondary | ICD-10-CM | POA: Diagnosis not present

## 2023-05-08 DIAGNOSIS — M6281 Muscle weakness (generalized): Secondary | ICD-10-CM

## 2023-05-08 DIAGNOSIS — R293 Abnormal posture: Secondary | ICD-10-CM

## 2023-05-08 DIAGNOSIS — R2689 Other abnormalities of gait and mobility: Secondary | ICD-10-CM

## 2023-05-08 DIAGNOSIS — R471 Dysarthria and anarthria: Secondary | ICD-10-CM | POA: Diagnosis not present
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
## 2023-05-08 NOTE — Patient Instructions (Addendum)
SWALLOWING EXERCISES Do these for 6-8 weeks, then 2 times per week afterwards You can use a wet spoon to swallow something if your mouth gets dry      Mendelsohn - Swallow, and squeeze hard for 5-7 seconds - relax (20/day)        2.  Pitch Raise - Say "he", with a low note and glide as high you can  - x20/day           3.  Breath hold swallow-cough           - Hold your breath           - Swallow and cough      x20/day

## 2023-05-08 NOTE — Therapy (Signed)
OUTPATIENT OCCUPATIONAL THERAPY NEURO  Treatment Note   Patient Name: Jeff Weaver MRN: [id number] DOB:1945-10-25, 77 y.o., male Today's Date: 05/08/2023  PCP: Pincus Sanes, MD REFERRING PROVIDER: Pincus Sanes, MD  END OF SESSION:  OT End of Session - 05/08/23 1456     Visit Number 11    Number of Visits 17    Date for OT Re-Evaluation 05/12/23    Authorization Type Medicare A&B    Progress Note Due on Visit 20    OT Start Time 1453    OT Stop Time 1532    OT Time Calculation (min) 39 min    Behavior During Therapy [facility] for tasks assessed/performed                       Past Medical History:  Diagnosis Date   Aortic insufficiency    Atypical chest pain    Cardiac resynchronization therapy pacemaker (CRT-P) in place    Cerebral aneurysm    a. s/p clipping at [facility] in 8/13 c/b short-term memory loss, cerebral hemorrhage and seizure disorder s/p VP shunt.   Cerebral hemorrhage (HCC)    Chronic combined systolic and diastolic CHF (congestive heart failure) (HCC)    CVA (cerebral infarction)    Diverticulosis    Enteritis (regional)    Dr Juanda Chance   First degree AV block    GI bleed    HTN (hypertension)    Hyperlipidemia    Hypertensive heart disease    IBS (irritable bowel syndrome)    Lewy body dementia (HCC)    Mild CAD    Cardiac cath 01/12/18 showed minimal nonobstructive CAD, normal LVEF, normal LVEDP.   Mild dilation of ascending aorta (HCC)    Mitral regurgitation    NSVT (nonsustained ventricular tachycardia) (HCC)    Orthostatic hypotension    Pacemaker    Pre-diabetes    PVC's (premature ventricular contractions)    Regional enteritis of large intestine (HCC) since 1978   Rheumatoid arthritis(714.0)    dxed in [employer] in 1980s   Second degree AV block, Mobitz type I    Seizures (HCC)    last April 26,2021   Sinus bradycardia    Sleep apnea    SSS (sick sinus syndrome) (HCC)    Subdural hematoma (HCC)    Past Surgical History:   Procedure Laterality Date   BIV PACEMAKER INSERTION CRT-P N/A 03/10/2021   Procedure: BIV PACEMAKER INSERTION CRT-P UPGRADE;  Surgeon: Regan Lemming, MD;  Location: [facility] INVASIVE CV LAB;  Service: Cardiovascular;  Laterality: N/A;   CHOLECYSTECTOMY N/A 01/07/2016   Procedure: LAPAROSCOPIC CHOLECYSTECTOMY ;  Surgeon: Abigail Miyamoto, MD;  Location: [facility] OR;  Service: General;  Laterality: N/A;   cns shunt  02/23/12   COLONOSCOPY W/ POLYPECTOMY  1978   negative 2009, Dr Juanda Chance. Due 2014   CRANIOTOMY  02/02/12   Dr Kelby Aline, [facility]-clipping of aneurysm   CRANIOTOMY  02-02-12   left pterional craniotomy for clipping complex anterior communicating artery aneurysm    HERNIA REPAIR     LEFT HEART CATH AND CORONARY ANGIOGRAPHY N/A 01/12/2018   Procedure: LEFT HEART CATH AND CORONARY ANGIOGRAPHY;  Surgeon: [place]az[place], Peter M, MD;  Location: [facility] INVASIVE CV LAB;  Service: Cardiovascular;  Laterality: N/A;   LEFT HEART CATHETERIZATION WITH CORONARY ANGIOGRAM N/A 11/26/2013   Procedure: LEFT HEART CATHETERIZATION WITH CORONARY ANGIOGRAM;  Surgeon: Marykay Lex, MD;  Location: [facility] CATH LAB;  Service: Cardiovascular;  Laterality: N/A;  NOSE SURGERY     PACEMAKER IMPLANT N/A 07/02/2018   Procedure: PACEMAKER IMPLANT;  Surgeon: Regan Lemming, MD;  Location: [facility] INVASIVE CV LAB;  Service: Cardiovascular;  Laterality: N/A;   SHOULDER SURGERY  1997   TONSILLECTOMY AND ADENOIDECTOMY     VENTRICULOPERITONEAL SHUNT  02-23-12   INSERTION OF RIGHT FRONTAL VENTRICULOPERITONEAL SHUNT WITH A CODMAN HAKIM PROGRAMMABLE VALVE   Patient Active Problem List   Diagnosis Date Noted   Benign paroxysmal positional vertigo of right ear 03/03/2023   COVID-19 03/03/2023   Abrasion, forearm with infection, left, initial encounter 03/03/2023   Physical deconditioning 12/20/2022   Poor balance 12/20/2022   PVC's (premature ventricular contractions) 09/19/2022   Diplopia 09/19/2022   Parkinsonism (HCC) 06/19/2022   Primary  localized osteoarthrosis of shoulder region 05/10/2022   Recurrent dislocation of shoulder joint 05/10/2022   Regional enteritis (HCC) 05/10/2022   Vestibular hypofunction of left ear 05/10/2022   Sensorineural hearing loss 05/10/2022   Senile osteoporosis 02/03/2022   Pain in right hip 01/05/2022   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   Intertrigo 10/01/2021   HFrEF (heart failure with reduced ejection fraction) (HCC) 09/18/2021   Blepharitis of upper and lower eyelids of both eyes 09/18/2021   Tinea cruris 09/18/2021   Lewy body dementia (HCC) 09/16/2021   Closed fracture of right hip (HCC) 09/16/2021   S/P hip replacement, right  07/2021 09/16/2021   Presence of right artificial hip joint 08/06/2021   Vascular dementia (HCC) 04/29/2021   Multiple falls 04/29/2021   Cough 04/09/2021   Vitamin D deficiency 04/09/2021   Vitamin B1 deficiency 04/09/2021   Right arm pain 01/06/2021   Seizures (HCC)    Muscle cramping 12/30/2019   Dizziness 12/30/2019   Unsteady gait 12/30/2019   Pacemaker 11/27/2019   Fatigue 07/09/2019   SSS (sick sinus syndrome) (HCC) 07/02/2018   Bilateral shoulder pain 06/14/2018   Upper airway cough syndrome vs cough variant asthma 03/20/2018   Acute midline low back pain without sciatica 12/25/2017   Depression 10/17/2017   CAD (coronary artery disease), hx of NSTEMI 08/15/2017   Chest pain 07/17/2017   SOBOE (shortness of breath on exertion) 06/08/2017   Bilateral sensorineural hearing loss 06/01/2017   Nasal turbinate hypertrophy 06/01/2017   Dysphagia 05/31/2017   Osteoporosis 01/30/2017   History of cerebral hemorrhage 12/28/2016   Seborrheic dermatitis 07/25/2016   Cognitive communication deficit 06/28/2016   Hypertensive heart disease 06/28/2016   Vitamin B 12 deficiency 03/16/2016   Confusion 03/13/2016   VP (ventriculoperitoneal) shunt status 02/12/2016   Orthostatic hypotension 01/05/2016   Benign prostatic hyperplasia with urinary obstruction  08/04/2014   Urinary urgency 08/04/2014   Gastroesophageal reflux disease 07/14/2014   Polymyalgia rheumatica (HCC) 05/28/2014   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/2014   SBE (subacute bacterial endocarditis) prophylaxis candidate 11/24/2013   Aphasia 07/17/2013   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 seizures, not intractable, without status epilepticus (HCC) 07/17/2013   Subdural hemorrhage (HCC) 07/17/2013   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HCC) 07/17/2013   Pre-diabetes 03/26/2013   Urinary frequency 10/31/2012   Nocturia 10/31/2012   History of cerebral aneurysm repair 10/18/2012   History of stroke without residual deficits 10/18/2012   Ocular rosacea 09/27/2012   Seizure disorder (HCC) 07/23/2012   Entropion 07/10/2012   Hyperopia with astigmatism 07/10/2012   Combined senile cataract 04/13/2012   Presbyopia 04/13/2012   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) 03/01/2012   Stroke (HCC) 03/01/2012   Cognitive safety issue 02/20/2012  Nonruptured cerebral aneurysm 11/23/2011   Obstructive sleep apnea syndrome 08/09/2011   Hyperlipidemia 02/03/2009   HTN (hypertension) 02/03/2009   History of Crohn's disease 04/17/2008   Allergic rhinitis, unspecified 07/05/1999   Cataract, right eye 07/05/1999    ONSET DATE: referral date 02/21/23  REFERRING DIAG: R41.841 (ICD-10-CM) - Cognitive communication deficit G31.83,F02.80 (ICD-10-CM) - Lewy body dementia, unspecified dementia severity, unspecified whether behavioral, psychotic, or mood disturbance or anxiety (HCC) I63.512 (ICD-10-CM) - 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   Pt's spouse reports pt sometimes carries walker when ambulating.  Pt accompanied by: self and family member (spouse, daughter)  PERTINENT HISTORY: Aortic insufficiency, CRT-P pacemaker placement, cerebral aneurysm s/p clipping, cerebral hemorrhage and seizure disorder s/p VP shunt, CHF, CVA, second degree AV block Mobitz type 1 s/p PPM, HTN, HLD, hypertensive heart disease, IBS, lew body dementia, mitral regurgitation, NSVT, orthostatic hypotension, RA, SSS, subdural hematoma, hernia repair, cardiac cath  PRECAUTIONS: Fall, ICD/Pacemaker, and Other: poor vision, STM impairments  WEIGHT BEARING RESTRICTIONS: No  PAIN:  Are you having pain? Yes: NPRS scale: 6/10 Pain location: lower back, neck, B knees R = L Pain description: aching, discomfort Aggravating factors: inactivity Relieving factors: Tylenol  FALLS: Has patient fallen in last 6 months? Yes. Number of falls 2-3, getting in/out of bed or when turning to pick something up   LIVING ENVIRONMENT: Lives with: lives with their family (daughter comes and goes, will spend a few days per week in the home with them). Lives in: House/apartment Stairs: Yes: Internal: 14 steps; and External: 1 step to enter home  Has following equipment at home: Dan Humphreys - 2 wheeled, Environmental consultant - 4 wheeled, Tour manager, and Grab bars  PLOF: Independent and Independent with basic ADLs  PATIENT GOALS: improve endurance and engagement in ADLs  OBJECTIVE:   HAND DOMINANCE: Right  ADLs: Transfers/ambulation related to ADLs: Mod I to supervision with RW Eating: spouse typically cuts foods for him, pt will lean towards food instead of bringing food to mouth, spoon is preferred utensil Grooming: increased  time UB Dressing: increased time, is ableto manage buttons LB Dressing: wears disposable underwear and pull on pants for increased ease Toileting: Mod I, does have a grab bar in front that he will pull up on; difficulty with hygiene post BM Bathing: spouse assists with bathing, requires encouragement to bathe Tub Shower transfers: spouse assists with transfers Equipment: Transfer tub bench and Grab bars  IADLs: Medication management: spouse fills pill box and will remind him to take  MOBILITY STATUS: Needs Assist: Mod I to Supervision with RW and Hx of falls; required min assist to stand from chair without arm rests  POSTURE COMMENTS:  rounded shoulders, forward head, decreased lumbar lordosis, increased thoracic kyphosis, and right head tilt  ACTIVITY TOLERANCE: Activity tolerance: diminished  UPPER EXTREMITY ROM:    Active ROM Right eval Left eval  Shoulder flexion 90 110  Shoulder abduction    Shoulder adduction    Shoulder extension    Shoulder internal  rotation 80% 70%  Shoulder external rotation 80% 80%  Elbow flexion    Elbow extension    Wrist flexion    Wrist extension    Wrist ulnar deviation    Wrist radial deviation    Wrist pronation    Wrist supination    (Blank rows = not tested)  HAND FUNCTION: 03/29/23 Grip strength: Right: 45, 40, 36 lbs; Left: 41, 36, 33 lbs  COORDINATION: 03/22/23 9 Hole Peg test: Right: 53.19 sec; Left: 1:17.28 sec Box and Blocks:  Right 26 blocks, Left 23 blocks  COGNITION: Overall cognitive status: History of cognitive impairments - at baseline Brief Interview for Mental Status:   Memory:  Word Repetition:   "Sock"  "Blue"  "Bed"   Orientation Level:    Year:   2024  Month: Sept  Day of Week: Weds  Memory:  Recall:     Unable to recall "Sock"  with cue   Able to recall  "Blue"  without cue   Able to recall  "Bed"   with cue     No Recall   VISION: Subjective report: diplopia.  Recently went to ophthalmologist  who applied a Fresnel prism 8 base in on the right lens of his readers. Baseline vision: Wears glasses for reading only  VISION ASSESSMENT: Impaired To be further assessed in functional context   TODAY'S TREATMENT:                                                                        DATE:  05/08/23 Attention: Engaged in small peg board pattern replication with use of key to identify correlating colors, challenging alternating attention, visual perception, and dexterity. Pt demonstrating good manipulation of pegs in-hand to place in peg board, however with c/o decreased dexterity. Pt initially scanning in linear fashion, however after 2 rows, pt then transitioning to scanning vertically with increased errors.  OT providing mod question cues to aid in sequencing, however therapist terminating task and transitioning to simple color replication pattern.  Pt with significant improvement with colored pattern replication without need to use key.  Pt recognizing errors occurring, but with decreased awareness of onset of errors.  With min question cues from OT, pt able to correct without additional direction.  Educated on functional carryover of task implications with systematic process to minimize onset of errors as well as recognition of errors and correcting as recognized to increase success.     05/03/23 Therapeutic Activities OT assessed progress towards pt's progress towards goals for 10th progress note today. See "goals" section below for details. OT provided pt and caregiver education regarding rolling table options with brakes, reducing fall risk, keeping walker on floor at all times, keeping walker "in field of vision" to remind pt to bring walker. Pt and caregivers verbalized understanding. However, pt demo'd some frustration with using walker consistently as evidenced by statements: "Sometimes I walk to the bathroom and I realize I forgot it," and "Where am I supposed to read my papers if my  walker is always in front of me?" OT provided verbal reassurance. OT and pt discussed keeping walker nearby and in field of vision at all times when seated and standing as visual cue to take walker when ambulating.  Pt agreed and acknowledged understanding. Self-care - Dressing - to increase ind with LB dressing tasks: Pt reported difficulty with moving LUE for piriformis stretch and for LB dressing tasks. Pt ind lifted R leg using pants to lift leg. Therefore, OT provided education to pt and caregivers on using pillowcase or towel just proximal to ankle to lift L leg for piriformis stretch. OT provided tactile cues, v/c, and min assist for placement of pillowcase. Pt demo'd understanding and caregivers acknowledged understanding. Pt c/o difficulty with socks falling down after walking and low back pain when bending at waist to adjust socks. OT educated pt and caregivers on use of dressing stick and reacher to adjust socks to decrease pain and to increase ind. Pt participated in trial of using dressing stick and reacher though required significant cues likely d/t unfamiliar task. Therefore, OT recommended continuing to practice crossing legs for LB dressing using pillowcase PRN to lift legs. Pt and caregivers verbalized understanding.  04/26/23 Dressing: pt reports putting on underwear is challenging, reports difficulty with button up shirts due to R shoulder issues.  Problem solving with pt and spouse in regards to setup with positioning during LB dressing, particularly donning underwear.  OT making recommendation to stretch absorbent underwear prior to donning to allow separation of fabric to allow food to slide between front/back fabric.  Engaged in simulated LB dressing with theraband to simulate leg hole with pt pulling each leg up into figure 4 position to thread leg hole.  Pt utilizing pant leg to fully pull leg up into figure 4 position. Bathroom AE: educated on modifications to toilet seat to allow for  increased UE support as needed with sit > stand and when weight shifting R/L as needed to don pants.  OT providing pictures of toilet safety frame with handles. Hip stretch: engaged in seated hip external rotation stretch bilaterally with 10-15 second hold x2.  Pt continues to require increased time to pull leg up into figure 4 position, but once there able to maintain position for stretch.  Encouraged performance of stretch to carryover to increased ease with LB dressing.   PATIENT EDUCATION: Education details: purpose of progress note, assessment of goals, see treatment note above for additional details Person educated: Patient, Spouse, and Child(ren) Education method: Explanation Education comprehension: verbalized understanding, returned demonstration, verbal cues required, tactile cues required, and needs further education  HOME EXERCISE PROGRAM: Access Code: [email or web address] URL: [email or web address] Date: 04/26/2023 Prepared by: [facility] - Outpatient  Rehab - [facility]  Exercises - Seated Shoulder Horizontal Abduction with Resistance  - 2 x daily - 10 reps - 3-5 sec hold - Seated Shoulder Diagonal with Resistance  - 2 x daily - 10 reps - 3-5 sec hold - Seated Active Figure 4 Hip Flexion and External Rotation  - 2 x daily - 5 reps - 2-3 sec hold   GOALS: Goals reviewed with patient? Yes  SHORT TERM GOALS: Target date: 04/14/23  Pt and caregiver will be independent with gentle ROM HEP for improved functional use of BUE. Baseline: Goal status: IN PROGRESS 05/03/23 - Pt required modeling, v/c and tactile cues for HEP. Caregivers ind demo'd understanding of ROM/UE HEP.   2.  Pt will verbalize understanding of task modifications and/or potential AE needs to increase ease, safety, and independence w/ ADLs Baseline:  Goal status: IN PROGRESS 05/03/23 - Pt's spouse reported using transfer tub bench for bathing to increase ind.  3.  Pt will verbalize understanding of  use of new  glasses with Fresnel lenses for increased visual perception as needed for ADLs and IADLs. Baseline:  Goal status: IN PROGRESS 05/03/23 - Pt reported lenses have not been processed so glasses not yet available. Pt's daughter reported a temporary pair was provided and pt is testing them for reading.   LONG TERM GOALS: Target date: 05/12/23  Pt will demonstrate and/or report ability to complete LB dressing to include footwear at Mod I level utilizing adaptive techniques and/or AE PRN. Baseline:  Goal status: IN PROGRESS 05/03/23 - Pt sits on bench and "works leg up" to put shoes on. Pt demo's increased difficulty with moving LUE compared to RUE.  2.  Pt will be able to complete toileting hygiene at Mod I level with adaptive techniques and/or AE PRN. Baseline:  Goal status: MET 05/03/23 - Pt's spouse reports pt completes toileting hygiene ind using grab bar and walker for balance support.   3.  Pt will demonstrate improved internal rotation as needed for clothing management and hygiene post toileting. Baseline:  Goal status: MET 05/03/23 - Pt demo's good BUE internal rotation to reach to low back. Pt reported improved ability to don shirt when putting R sleeve first and then using internal rotation of LUE to pull shirt around to L side.  4.  Pt will demonstrate improved sustained attention to task with ability to complete table top task for 10 mins with 1 or fewer cues for attention to task. Baseline:  Goal status: IN PROGRESS 05/03/23 - Pt participated in tasks with mod v/c to attend to tasks.  ASSESSMENT:  CLINICAL IMPRESSION:  Pt demonstrating good attention to min difficulty pattern replication task, however requiring increased cues and assistance with more challenging pattern replication involving use of key to decipher color/pattern.  Pt with improved ease with sit > stand and transitional movements before and after session.  Pt does continue to benefit from v/c, tactile  cues, and modeling to attend to tasks and participate in unfamiliar tasks.  PERFORMANCE DEFICITS: in functional skills including ADLs, IADLs, ROM, pain, flexibility, Gross motor control, balance, body mechanics, endurance, and decreased knowledge of use of DME, cognitive skills including attention, memory, problem solving, and safety awareness, and psychosocial skills including coping strategies, environmental adaptation, and habits.     PLAN:  OT FREQUENCY: 2x/week  OT DURATION: 8 weeks  PLANNED INTERVENTIONS: self care/ADL training, therapeutic exercise, therapeutic activity, neuromuscular re-education, passive range of motion, balance training, functional mobility training, ultrasound, compression bandaging, moist heat, cryotherapy, patient/family education, cognitive remediation/compensation, visual/perceptual remediation/compensation, psychosocial skills training, energy conservation, coping strategies training, and DME and/or AE instructions  RECOMMENDED OTHER SERVICES: NA  CONSULTED AND AGREED WITH PLAN OF CARE: Patient and family member/caregiver  PLAN FOR NEXT SESSION: Continue to address adaptive techniques for LB dressing, review bag exercises (for LB dressing and UB mobility), sit > stand, grip and UB strengthening.  Educate on memory strategies and implementing into home.  D/C if family agreeable.   Rosalio Loud, OTR/L 05/08/2023, 2:57 PM

## 2023-05-08 NOTE — Therapy (Signed)
OUTPATIENT SPEECH LANGUAGE PATHOLOGY TREATMENT/progress note   Patient Name: Jeff Weaver MRN: [id number] DOB:05/08/1946, 77 y.o., male Today's Date: 05/08/2023  PCP: Cheryll Cockayne, MD REFERRING PROVIDER: Same as PCP  END OF SESSION:  End of Session - 05/08/23 1702     Visit Number 11    Number of Visits 25    Date for SLP Re-Evaluation 06/13/23    SLP Start Time 1620    SLP Stop Time  1653    SLP Time Calculation (min) 33 min    Activity Tolerance Patient tolerated treatment well                  Past Medical History:  Diagnosis Date   Aortic insufficiency    Atypical chest pain    Cardiac resynchronization therapy pacemaker (CRT-P) in place    Cerebral aneurysm    a. s/p clipping at [facility] in 8/13 c/b short-term memory loss, cerebral hemorrhage and seizure disorder s/p VP shunt.   Cerebral hemorrhage (HCC)    Chronic combined systolic and diastolic CHF (congestive heart failure) (HCC)    CVA (cerebral infarction)    Diverticulosis    Enteritis (regional)    Dr Juanda Chance   First degree AV block    GI bleed    HTN (hypertension)    Hyperlipidemia    Hypertensive heart disease    IBS (irritable bowel syndrome)    Lewy body dementia (HCC)    Mild CAD    Cardiac cath 01/12/18 showed minimal nonobstructive CAD, normal LVEF, normal LVEDP.   Mild dilation of ascending aorta (HCC)    Mitral regurgitation    NSVT (nonsustained ventricular tachycardia) (HCC)    Orthostatic hypotension    Pacemaker    Pre-diabetes    PVC's (premature ventricular contractions)    Regional enteritis of large intestine (HCC) since 1978   Rheumatoid arthritis(714.0)    dxed in [employer] in 1980s   Second degree AV block, Mobitz type I    Seizures (HCC)    last April 26,2021   Sinus bradycardia    Sleep apnea    SSS (sick sinus syndrome) (HCC)    Subdural hematoma (HCC)    Past Surgical History:  Procedure Laterality Date   BIV PACEMAKER INSERTION CRT-P N/A 03/10/2021   Procedure: BIV  PACEMAKER INSERTION CRT-P UPGRADE;  Surgeon: Regan Lemming, MD;  Location: [facility] INVASIVE CV LAB;  Service: Cardiovascular;  Laterality: N/A;   CHOLECYSTECTOMY N/A 01/07/2016   Procedure: LAPAROSCOPIC CHOLECYSTECTOMY ;  Surgeon: Abigail Miyamoto, MD;  Location: [facility];  Service: General;  Laterality: N/A;   cns shunt  02/23/12   COLONOSCOPY W/ POLYPECTOMY  1978   negative 2009, Dr Juanda Chance. Due 2014   CRANIOTOMY  02/02/12   Dr Kelby Aline, [facility]-clipping of aneurysm   CRANIOTOMY  02-02-12   left pterional craniotomy for clipping complex anterior communicating artery aneurysm    HERNIA REPAIR     LEFT HEART CATH AND CORONARY ANGIOGRAPHY N/A 01/12/2018   Procedure: LEFT HEART CATH AND CORONARY ANGIOGRAPHY;  Surgeon: Swaziland, Peter M, MD;  Location: [facility] INVASIVE CV LAB;  Service: Cardiovascular;  Laterality: N/A;   LEFT HEART CATHETERIZATION WITH CORONARY ANGIOGRAM N/A 11/26/2013   Procedure: LEFT HEART CATHETERIZATION WITH CORONARY ANGIOGRAM;  Surgeon: Marykay Lex, MD;  Location: [facility] CATH LAB;  Service: Cardiovascular;  Laterality: N/A;   NOSE SURGERY     PACEMAKER IMPLANT N/A 07/02/2018   Procedure: PACEMAKER IMPLANT;  Surgeon: Regan Lemming, MD;  Location: [facility] INVASIVE  CV LAB;  Service: Cardiovascular;  Laterality: N/A;   SHOULDER SURGERY  1997   TONSILLECTOMY AND ADENOIDECTOMY     VENTRICULOPERITONEAL SHUNT  02-23-12   INSERTION OF RIGHT FRONTAL VENTRICULOPERITONEAL SHUNT WITH A CODMAN HAKIM PROGRAMMABLE VALVE   Patient Active Problem List   Diagnosis Date Noted   Benign paroxysmal positional vertigo of right ear 03/03/2023   COVID-19 03/03/2023   Abrasion, forearm with infection, left, initial encounter 03/03/2023   Physical deconditioning 12/20/2022   Poor balance 12/20/2022   PVC's (premature ventricular contractions) 09/19/2022   Diplopia 09/19/2022   Parkinsonism (HCC) 06/19/2022   Primary localized osteoarthrosis of shoulder region 05/10/2022   Recurrent dislocation of shoulder  joint 05/10/2022   Regional enteritis (HCC) 05/10/2022   Vestibular hypofunction of left ear 05/10/2022   Sensorineural hearing loss 05/10/2022   Senile osteoporosis 02/03/2022   Pain in right hip 01/05/2022   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   Intertrigo 10/01/2021   HFrEF (heart failure with reduced ejection fraction) (HCC) 09/18/2021   Blepharitis of upper and lower eyelids of both eyes 09/18/2021   Tinea cruris 09/18/2021   Lewy body dementia (HCC) 09/16/2021   Closed fracture of right hip (HCC) 09/16/2021   S/P hip replacement, right  07/2021 09/16/2021   Presence of right artificial hip joint 08/06/2021   Vascular dementia (HCC) 04/29/2021   Multiple falls 04/29/2021   Cough 04/09/2021   Vitamin D deficiency 04/09/2021   Vitamin B1 deficiency 04/09/2021   Right arm pain 01/06/2021   Seizures (HCC)    Muscle cramping 12/30/2019   Dizziness 12/30/2019   Unsteady gait 12/30/2019   Pacemaker 11/27/2019   Fatigue 07/09/2019   SSS (sick sinus syndrome) (HCC) 07/02/2018   Bilateral shoulder pain 06/14/2018   Upper airway cough syndrome vs cough variant asthma 03/20/2018   Acute midline low back pain without sciatica 12/25/2017   Depression 10/17/2017   CAD (coronary artery disease), hx of NSTEMI 08/15/2017   Chest pain 07/17/2017   SOBOE (shortness of breath on exertion) 06/08/2017   Bilateral sensorineural hearing loss 06/01/2017   Nasal turbinate hypertrophy 06/01/2017   Dysphagia 05/31/2017   Osteoporosis 01/30/2017   History of cerebral hemorrhage 12/28/2016   Seborrheic dermatitis 07/25/2016   Cognitive communication deficit 06/28/2016   Hypertensive heart disease 06/28/2016   Vitamin B 12 deficiency 03/16/2016   Confusion 03/13/2016   VP (ventriculoperitoneal) shunt status 02/12/2016   Orthostatic hypotension 01/05/2016   Benign prostatic hyperplasia with urinary obstruction 08/04/2014   Urinary urgency 08/04/2014   Gastroesophageal reflux disease 07/14/2014    Polymyalgia rheumatica (HCC) 05/28/2014   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/2014   SBE (subacute bacterial endocarditis) prophylaxis candidate 11/24/2013   Aphasia 07/17/2013   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 seizures, not intractable, without status epilepticus (HCC) 07/17/2013   Subdural hemorrhage (HCC) 07/17/2013   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HCC) 07/17/2013   Pre-diabetes 03/26/2013   Urinary frequency 10/31/2012   Nocturia 10/31/2012   History of cerebral aneurysm repair 10/18/2012   History of stroke without residual deficits 10/18/2012   Ocular rosacea 09/27/2012   Seizure disorder (HCC) 07/23/2012   Entropion 07/10/2012   Hyperopia with astigmatism 07/10/2012   Combined senile cataract 04/13/2012   Presbyopia 04/13/2012   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 SLP cont to work with pt on swallowing HEP and family education to assist pt with HEP procedure at home.  Goal Update: see below  Plan: see pt for approx 2 more sessions then follow up MBS with cont'd ST as needed after that time.  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  PAIN:  Are you having pain? Yes: NPRS scale: 7/10 Pain location: bil knees Description: soreness Aggravating: Sit>stand Alleviating: movement  Are you having pain? Yes: NPRS scale: 4/10 Pain location: lumbar area Description: it's like a muscle that's trying to heal but has't Aggravating: walking Alleviating: sitting  FALLS: Has patient fallen in last 6 months?  See PT evaluation for details   PATIENT GOALS: Increase speech clarity, assistance with food clearance  OBJECTIVE:  MBS - 04/07/23 Clinical Impression: Swallowing continues to be notable for mistiming of the pharyngeal swallow with thin, nectar, and honey-thick liquids entering the laryngeal vestibule prior to its closure. This led to intermittent, trace aspiration of all tested liquids. Aspiration was generally silent, excluding one occasion of coughing. There was limited laryngeal elevation or anterior movement, but base-of-tongue retraction and pharyngeal stripping helped close the epiglottis over larynx and contributed to clearance of boluses through pharynx. Curvature of cervical vertebrae contributed to risk of aspiration. Chair was reclined in an effort to facilitate travel of liquids through posterior pharynx; this position may have helped reduce quantity of aspiration, though it was difficult to quantify improvement. Pt viewed the video in real time and results were discussed with Lennox Grumbles and Mrs. Toni Arthurs afterwards.  Pt's swallowing has been compromised at least since aspiration was documented in April's MBS. He has thus far not developed pneumonia. We discussed the importance of activity/mobility as well as pursuing further therapy to minimize the impact of aspiration. Pt should continue regular solids/thin liquids; meds whole in puree; sips of liquid should be taken one at a time. Recommend focus of tx on laryngeal mobility and timing of pharyngeal swallow. Pt will follow up with Verdie Mosher, OP SLP   MBS [facility] April 2024: Patient with mild oropharyngeal dysphagia, like secondary to age-related changes to the swallowing mechanism and generalized deconditioning. Dysphagia is primarily characterized by mistiming of the pharyngeal swallow and reduced supraglottic closure, resulting in penetration of thin liquids during the swallow with intermittent silent aspiration on progression of cord-level residual. Cued post-swallow throat clearing was effective at mitigating penetrants. PAS scores below. Bolus propulsion and UES distension is largely intact. Based on today's examination, recommend patient continue regular solids/thin liquids, pills one at a time, with adherence to aspiration  precautions and swallowing strategies including alternation of liquids and solids, SINGLE sips at a time, and regular post-swallow throat clearing after the swallow. Provided risks and adverse outcomes associated with dysphagia and aspiration (i.e possible life-threatening pneumonia) and rationale for adherence to management recommendations. Did provide signs of worsening dysphagia and return to clinic criteria. Will re-evaluate in 6 months, certainly sooner if indicated. Patient and family verbalized understanding of all exam results and recommendations.  Recommendations: Diet: Regular Aspiration precautions:  sit upright during all PO (as close to 90 degrees as possible) alternate bites/sips cough/clear throat regularly during the  meal SINGLE sips  Medications: one at a time   PATIENT REPORTED OUTCOME MEASURES (PROM): Communication Effectiveness Survey:  provided in session on 04/24/23. Daughter and Asher Muir scored exactly the same, 17/32, with lower scores indicating deficits impacting effectiveness of communication. "1" (not at all effective) scored for speaking in a noisy environment.   TODAY'S TREATMENT:                                                                                                                                         DATE:  05/08/23: As in pt's previous session pt's wife stated pt was completing HEP on his own, and she asks him if he has completed HEP. SLP observed pt with his HEP in front of him to assess for accuracy. Pt combined steps of Mendelsohn with supraglottic, so SLP reprinted pt's HEP with very simple language, and not any similar language between [place] and supraglottic, separating the two exercises with the pitch raise exercise. Pt req'd occasional min A once the handout was reformatted with  language conducive to pt's deficit areas. Based upon pt's performance today he was likely not completing HEP correctly by himself.  SLP printed out another copy for wife to use at home. She and SLP agreed one more appointment one to two weeks from today would be good to ensure pt still completing HEP correctly, then timing of MBS would be contingent upon how long pt has been completing HEP correctly. Given today's handout SLP hopes this will improve pt's accuracy at home.  05/03/23: OT is performing memory strategies with pt and family.  SLP performed HEP for swallowing with pt and pt's daughter and wife (separately) to assess their accuracy in assisting pt with dysphagia HEP. Today both daughter and wife affirmed they are comfortable helping pt at home, with modified postural changes when pt does Mendelsohn. Pt noted to be confused between mendelsohn and supraglottic and daughter and wife both were  successful in assisting pt with this. Currently wife leaves pt to complete HEP on his own and asks if he has completed HEP. SLP stressed pt may need assistance to perform the HEP correctly.  Suspect d/c in approx 2 visits.  04/24/23: When SLP asked pt if he thought he was speaking louder to [place] and Natalia Leatherwood he remarked that the  TV is not loud enough for him. SLP redirected and he thought he was speaking louder. Pt's daughter reported she doesn't have to ask pt to speak up as often. SLP answered pt's questions about nasal drip and anterior labial leakage, only to ascertain 5 minutes later that pt is not eating when he has nasal drip. SLP referred pt to his PCP. SLP then clarified what MBS report said and encouraged family to complete dysphagia HEP at least x5/week with pt. Agreed. Daughter to sit in next session to observe dysphagia tx exercises to assist pt PRN. SLP then inquired what pt/family thought was important to work on. Memory strategies/compensations were mentioned; SLP to inquire with OT to see if these are/have been addressed with pt and family.   10/17/24Asher Muir did not give CES to SLP today.  SLP engaged pt in 7 minutes conversation, with occasional nonverbal cue pt maintained WNL speech volume. Pt still with more than average saliva production due to dental work. Today pt preferred Jamie to sit in the lobby. SLP worked with pt in improved speech clarity, in phrase and sentence responses. Pt req'd min cues occasionally for increased diction/enunciation at this level. SLP wonders about carryover given pt's concurrent dx's. SLP to consider suggesting speech compensations written down and placed at locations in the house pt frequents.   10/16/24Asher Muir to bring in CES next session. SLP asked her to and Natalia Leatherwood to fill out the form as well. Pt self-corrected loudness with nonverbal cue. SLP spent 8 minutes attempting to work on pt's loudness but pt cont to be fixated with issues re: family  about the TV loudness. After 8 minutes of pt fixation, SLP abandoned this therapy task and spoke about swallowing exercises. Asher Muir shared they did not have a chance to practice the exercises, so SLP attempted problem solving with pt and wife about when pt could fit them into his day. When asked, pt gave example of his PT exercises, so SLP reiterated swallowing exercises and repeated the question. Pt cont to demonstrate misunderstanding, so SLP suggested pt complete his swallowing exercises at the same time as his PT exercises. SLP discussed compensations for word finding with wife and pt, due to pt stating he experiences anomia at times at home.   No overt s/sx anomia today but pt told SLP he has anomic episodes - SLP reiterated naming compensations.   04/12/23: SLP to ask for pt's CES next session. Given pt's MBS on Friday, SLP reviewed this with pt/wife. SLP initiated a HEP of Mendelsohn, supraglottic swallow, and "ee" glide. Pt req'd usual min-mod A initially, faded to rare min A. Marijean Niemann was asked by SLP if she was comfortable assisting pt with HEP and she answered affirmatively. SLP will shape pt's pitch glide next session as production was too harsh; SLP wanted to reinforce the gliding nature of the exercise at this time. Pt's wife and SLP agreed it would be challenging to have pt recline for meals and since quantifiable difference could  not be made between reclined and not reclined SLP and wife agreed pt should remain upright for meals.   04/03/23: Pt and Jamie agree that pt is talking louder at home. Throughout session, pt did in fact have louder, WNL volume for much of today's session in conversation, with average overall today 68dB (WNL 70-72dB). In sentence level responses pt's loudness was WNL, even with mod complex responses. Pt to cont to practice everyday sentences at home. Today, pt recited everyday sentences initially with average  73 dB, and with min cues for ensuring pt shouting/using  considerable effort with everyday sentences he improved loudness to 83dB average.   03/29/23: Pt completed conversation in the ST room with [facility] loudness; rare min cues for loudness with monologues, but pt successfully incr'd loudness each time. Asher Muir will bring CES next session. [place] recited his everyday sentences with initial cues for loudness. He indicated again that he did not want to shout, and SLP again reminded him that everyday sentences should be shouting, but this will not carry to conversation. Educated again about rationale for everyday sentences (muscle memory for conversation). Because pt's memory skills are reduced, affecting pt's ability to recall that he needs to speak louder, SLP also educated wife and pt about effective communication techniques. According to Asher Muir she/they have already worked to find and implement some of these techniques already as pt's various challenges with communication began in 2013.  03/27/23: MBS scheduled for 04/07/23. SLP cued pt if he was having difficulty with drooling to swallow first and then wipe excess saliva. SLP provided a CES today to [place] and asked her to complete and return next session. SLP worked with pt's loudness today, first in everyday sentences pt with average 83 dB; in sentence completion tasks averaged 74dB with min A <10% for increasing loudness. In simiarity/difference task, pt's average loudness was 72dB however pt's responses did not match stimulus 25% of the time and SLP had to provide cues for pt to correctly answer, sometimes using max cues. Suspect fatigue played a role with this, as ST was pt's last therapy today.   03/22/23: Pt needs CES next session.  SLP introduced pt and wife to everyday sentences. Pt thought of 5 and wife generated 4. SLP instructed pt to recite these twice a day in a loud, strong voice, and explained rationale for pt and wife. Pt questioned rationale and SLP drew a diagram with WNL volume and pt's conversational  volume and reiterated when he talks loudly it brings his volume into/closer to WNL range. 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 SLP encouraged him that this was WNL volume for a portion of his last conversational turn.  03/15/23 (eval): n/a  PATIENT EDUCATION: Education details: See "today's treatment" for details Person educated: Patient and Spouse Education method: Explanation, Demonstration, Verbal cues, and Handouts Education comprehension: verbalized understanding and needs further education  HOME EXERCISE PROGRAM: Swallow HEP as above   GOALS: Goals reviewed with patient? Yes (in general)  SHORT TERM GOALS: Target date: 04/14/23  Pt will complete MBS to assess current swallowing status and assess safe swallow strategies Baseline: Goal status: Met  2.  Pt will maintain average mid-upper 80s for loud /a/ in 3 sessions Baseline:  Goal status: deferred (to LTGs)  3.  Pt will use speech volume average upper 60s dB for 5 minutes simple conversation in two sessions Baseline:  Goal status: not met  4.  pt will generate abdominal breathing at rest (seated position) 80% of the time over 2 sessions Baseline:  Goal status: DEFERRED   LONG TERM GOALS: Target date: 05/12/23  Pt will improve PROM measure scores in the last 1-2 sessions Baseline:  Goal status: INITIAL  2.  Pt will maintain average upper 80s for loud /a/ in 3 sessions Baseline:  Goal status: DEFERRED   3.  Pt  will use speech volume average low 70s dB for 8 minutes simple-mod complex  conversation with occasional verbal cues in two sessions Baseline:  Goal status: DEFERRED  4.   Pt will generate abdominal breathing 60% of the time in 8 minutes simple-mod complex conversation over 2 sessions Baseline:  Goal status: DEFERRED  5.  Pt will complete swallowing HEP with  occasional min A in 3 sessions  Baseline:05/03/23, 05/08/23  Goal status: INITIAL  6. Pt will maintain average mid-upper 80s for loud /a/ in 3 sessions Baseline:  Goal status: DEFERRED   ASSESSMENT:  CLINICAL IMPRESSION: Patient is a 77 y.o. M who was seen today for ST in light of Parkinsonism and other complex medical hx. See "today's treatment" for more details. Pt's OT to target compensations for cognition/memory. Given pt's performance today and in recent sessions, suspect many of pt's current cognitive communication deficits are due to compounded neurological insults with overlying dx of Lewy body dementia. SLP to focus on swallowing goals, mainly, determining today that pt's speech volume has improved in that family has to ask less for Samel to repeat. OBJECTIVE IMPAIRMENTS: Objective impairments include memory, dysarthria, and dysphagia. These impairments are limiting patient from ADLs/IADLs, effectively communicating at home and in community, and safety when swallowing.Factors affecting potential to achieve goals and functional outcome are ability to learn/carryover information and co-morbidities.. Patient will benefit from skilled SLP services to address above impairments and improve overall function.  REHAB POTENTIAL: Fair due to medical hx  PLAN:  SLP FREQUENCY: 2x/week  SLP DURATION: 8 weeks  PLANNED INTERVENTIONS: Aspiration precaution training, Pharyngeal strengthening exercises, Diet toleration management , Environmental controls, Trials of upgraded texture/liquids, Internal/external aids, Oral motor exercises, Functional tasks, SLP instruction and feedback, Compensatory strategies, and Patient/family education    [facility], CCC-SLP 05/08/2023, 5:11 PM

## 2023-05-09 DIAGNOSIS — K117 Disturbances of salivary secretion: Secondary | ICD-10-CM | POA: Diagnosis not present

## 2023-05-10 ENCOUNTER — Ambulatory Visit: Payer: Medicare Other

## 2023-05-10 ENCOUNTER — Ambulatory Visit: Payer: Medicare Other | Admitting: Occupational Therapy

## 2023-05-10 DIAGNOSIS — I69918 Other symptoms and signs involving cognitive functions following unspecified cerebrovascular disease: Secondary | ICD-10-CM

## 2023-05-10 DIAGNOSIS — R293 Abnormal posture: Secondary | ICD-10-CM | POA: Diagnosis not present

## 2023-05-10 DIAGNOSIS — R2689 Other abnormalities of gait and mobility: Secondary | ICD-10-CM | POA: Diagnosis not present

## 2023-05-10 DIAGNOSIS — R41840 Attention and concentration deficit: Secondary | ICD-10-CM

## 2023-05-10 DIAGNOSIS — M6281 Muscle weakness (generalized): Secondary | ICD-10-CM

## 2023-05-10 DIAGNOSIS — R2681 Unsteadiness on feet: Secondary | ICD-10-CM | POA: Diagnosis not present

## 2023-05-10 NOTE — Therapy (Signed)
OUTPATIENT OCCUPATIONAL THERAPY  Treatment Note / Discharge / Progress Note  Patient Name: Jeff Weaver MRN: [id number] DOB:05-27-1946, 77 y.o., male Today's Date: 05/10/2023  OCCUPATIONAL THERAPY DISCHARGE SUMMARY  Visits from Start of Care: 12  Current functional level related to goals / functional outcomes: Pt has met 1 out of 3 STGs and 4 out of 4 LTGs. One STG was deferred d/t pt not yet receiving permanent pair of Fresnel lenses. Pt made good overall progress towards goals.   Remaining deficits: Pt continues to benefit from v/c and reminders to complete HEP and for safety.    Education / Equipment: Review of previous HEP (bag exercises, piriformis and shoulder exercises), memory compensation strategies, activities to keep thinking skills sharp, adaptive strategies and A/E for ADL/IADL tasks, walker safety during ADL/IADL tasks.   Patient and pt's spouse agree to discharge. Patient goals were partially met. Patient is being discharged due to maximized rehab potential and meeting the majority of therapy goals.  Progress Note This 12th progress note is for dates: 03/15/23 to 05/10/2023. Pt has met 1 out of 3 STGs and 4 out of 4 LTGs. One STG was deferred d/t pt not yet receiving permanent pair of Fresnel lenses. Pt made good overall progress towards goals. Pt's caregivers and pt demonstrates appropriate understanding of HEP and education to carry over to home environment. Therefore, pt to D/C from OT today. Pt and pt's spouse agreed with OT D/C.   PCP: Pincus Sanes, MD REFERRING PROVIDER: Pincus Sanes, MD  END OF SESSION:  OT End of Session - 05/10/23 1538     Visit Number 12    Number of Visits 17    Date for OT Re-Evaluation 05/12/23    Authorization Type Medicare A&B    Progress Note Due on Visit 20    OT Start Time 1448    OT Stop Time 1531    OT Time Calculation (min) 43 min    Activity Tolerance --    Behavior During Therapy WFL for tasks assessed/performed                         Past Medical History:  Diagnosis Date   Aortic insufficiency    Atypical chest pain    Cardiac resynchronization therapy pacemaker (CRT-P) in place    Cerebral aneurysm    a. s/p clipping at [facility] in 8/13 c/b short-term memory loss, cerebral hemorrhage and seizure disorder s/p VP shunt.   Cerebral hemorrhage (HCC)    Chronic combined systolic and diastolic CHF (congestive heart failure) (HCC)    CVA (cerebral infarction)    Diverticulosis    Enteritis (regional)    Dr Juanda Chance   First degree AV block    GI bleed    HTN (hypertension)    Hyperlipidemia    Hypertensive heart disease    IBS (irritable bowel syndrome)    Lewy body dementia (HCC)    Mild CAD    Cardiac cath 01/12/18 showed minimal nonobstructive CAD, normal LVEF, normal LVEDP.   Mild dilation of ascending aorta (HCC)    Mitral regurgitation    NSVT (nonsustained ventricular tachycardia) (HCC)    Orthostatic hypotension    Pacemaker    Pre-diabetes    PVC's (premature ventricular contractions)    Regional enteritis of large intestine (HCC) since 1978   Rheumatoid arthritis(714.0)    dxed in [employer] in 1980s   Second degree AV block, Mobitz type I  Seizures (HCC)    last April 26,2021   Sinus bradycardia    Sleep apnea    SSS (sick sinus syndrome) (HCC)    Subdural hematoma (HCC)    Past Surgical History:  Procedure Laterality Date   BIV PACEMAKER INSERTION CRT-P N/A 03/10/2021   Procedure: BIV PACEMAKER INSERTION CRT-P UPGRADE;  Surgeon: Regan Lemming, MD;  Location: [facility] INVASIVE CV LAB;  Service: Cardiovascular;  Laterality: N/A;   CHOLECYSTECTOMY N/A 01/07/2016   Procedure: LAPAROSCOPIC CHOLECYSTECTOMY ;  Surgeon: Abigail Miyamoto, MD;  Location: [facility] OR;  Service: General;  Laterality: N/A;   cns shunt  02/23/12   COLONOSCOPY W/ POLYPECTOMY  1978   negative 2009, Dr Juanda Chance. Due 2014   CRANIOTOMY  02/02/12   Dr Kelby Aline, [facility]-clipping of aneurysm   CRANIOTOMY  02-02-12   left  pterional craniotomy for clipping complex anterior communicating artery aneurysm    HERNIA REPAIR     LEFT HEART CATH AND CORONARY ANGIOGRAPHY N/A 01/12/2018   Procedure: LEFT HEART CATH AND CORONARY ANGIOGRAPHY;  Surgeon: Swazil[place], Peter M, MD;  Location: [facility] INVASIVE CV LAB;  Service: Cardiovascular;  Laterality: N/A;   LEFT HEART CATHETERIZATION WITH CORONARY ANGIOGRAM N/A 11/26/2013   Procedure: LEFT HEART CATHETERIZATION WITH CORONARY ANGIOGRAM;  Surgeon: Marykay Lex, MD;  Location: [facility] CATH LAB;  Service: Cardiovascular;  Laterality: N/A;   NOSE SURGERY     PACEMAKER IMPLANT N/A 07/02/2018   Procedure: PACEMAKER IMPLANT;  Surgeon: Regan Lemming, MD;  Location: [facility] INVASIVE CV LAB;  Service: Cardiovascular;  Laterality: N/A;   SHOULDER SURGERY  1997   TONSILLECTOMY AND ADENOIDECTOMY     VENTRICULOPERITONEAL SHUNT  02-23-12   INSERTION OF RIGHT FRONTAL VENTRICULOPERITONEAL SHUNT WITH A CODMAN HAKIM PROGRAMMABLE VALVE   Patient Active Problem List   Diagnosis Date Noted   Benign paroxysmal positional vertigo of right ear 03/03/2023   COVID-19 03/03/2023   Abrasion, forearm with infection, left, initial encounter 03/03/2023   Physical deconditioning 12/20/2022   Poor balance 12/20/2022   PVC's (premature ventricular contractions) 09/19/2022   Diplopia 09/19/2022   Parkinsonism (HCC) 06/19/2022   Primary localized osteoarthrosis of shoulder region 05/10/2022   Recurrent dislocation of shoulder joint 05/10/2022   Regional enteritis (HCC) 05/10/2022   Vestibular hypofunction of left ear 05/10/2022   Sensorineural hearing loss 05/10/2022   Senile osteoporosis 02/03/2022   Pain in right hip 01/05/2022   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   Intertrigo 10/01/2021   HFrEF (heart failure with reduced ejection fraction) (HCC) 09/18/2021   Blepharitis of upper and lower eyelids of both eyes 09/18/2021   Tinea cruris 09/18/2021   Lewy body dementia (HCC) 09/16/2021   Closed fracture of  right hip (HCC) 09/16/2021   S/P hip replacement, right  07/2021 09/16/2021   Presence of right artificial hip joint 08/06/2021   Vascular dementia (HCC) 04/29/2021   Multiple falls 04/29/2021   Cough 04/09/2021   Vitamin D deficiency 04/09/2021   Vitamin B1 deficiency 04/09/2021   Right arm pain 01/06/2021   Seizures (HCC)    Muscle cramping 12/30/2019   Dizziness 12/30/2019   Unsteady gait 12/30/2019   Pacemaker 11/27/2019   Fatigue 07/09/2019   SSS (sick sinus syndrome) (HCC) 07/02/2018   Bilateral shoulder pain 06/14/2018   Upper airway cough syndrome vs cough variant asthma 03/20/2018   Acute midline low back pain without sciatica 12/25/2017   Depression 10/17/2017   CAD (coronary artery disease), hx of NSTEMI 08/15/2017   Chest pain 07/17/2017   SOBOE (shortness  of breath on exertion) 06/08/2017   Bilateral sensorineural hearing loss 06/01/2017   Nasal turbinate hypertrophy 06/01/2017   Dysphagia 05/31/2017   Osteoporosis 01/30/2017   History of cerebral hemorrhage 12/28/2016   Seborrheic dermatitis 07/25/2016   Cognitive communication deficit 06/28/2016   Hypertensive heart disease 06/28/2016   Vitamin B 12 deficiency 03/16/2016   Confusion 03/13/2016   VP (ventriculoperitoneal) shunt status 02/12/2016   Orthostatic hypotension 01/05/2016   Benign prostatic hyperplasia with urinary obstruction 08/04/2014   Urinary urgency 08/04/2014   Gastroesophageal reflux disease 07/14/2014   Polymyalgia rheumatica (HCC) 05/28/2014   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/2014   SBE (subacute bacterial endocarditis) prophylaxis candidate 11/24/2013   Aphasia 07/17/2013   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 seizures, not intractable, without status epilepticus (HCC) 07/17/2013   Subdural hemorrhage (HCC) 07/17/2013   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HCC) 07/17/2013   Pre-diabetes 03/26/2013   Urinary frequency 10/31/2012   Nocturia 10/31/2012   History of  cerebral aneurysm repair 10/18/2012   History of stroke without residual deficits 10/18/2012   Ocular rosacea 09/27/2012   Seizure disorder (HCC) 07/23/2012   Entropion 07/10/2012   Hyperopia with astigmatism 07/10/2012   Combined senile cataract 04/13/2012   Presbyopia 04/13/2012   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) 03/01/2012   Stroke (HCC) 03/01/2012   Cognitive safety issue 02/20/2012   Nonruptured cerebral aneurysm 11/23/2011   Obstructive sleep apnea syndrome 08/09/2011   Hyperlipidemia 02/03/2009   HTN (hypertension) 02/03/2009   History of Crohn's disease 04/17/2008   Allergic rhinitis, unspecified 07/05/1999   Cataract, right eye 07/05/1999    ONSET DATE: referral date 02/21/23  REFERRING DIAG: R41.841 (ICD-10-CM) - Cognitive communication deficit G31.83,F02.80 (ICD-10-CM) - Lewy body dementia, unspecified dementia severity, unspecified whether behavioral, psychotic, or mood disturbance or anxiety (HCC) I63.512 (ICD-10-CM) - 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 However, pt's spouse reported there is enough space in home. Pt's spouse reports sometimes pt prefers to keep things a certain way (e.g. placing papers on floor, going to bathroom without walker).   Pt accompanied by: self and family member (spouse)  PERTINENT HISTORY: Aortic  insufficiency, CRT-P pacemaker placement, cerebral aneurysm s/p clipping, cerebral hemorrhage and seizure disorder s/p VP shunt, CHF, CVA, second degree AV block Mobitz type 1 s/p PPM, HTN, HLD, hypertensive heart disease, IBS, lew body dementia, mitral regurgitation, NSVT, orthostatic hypotension, RA, SSS, subdural hematoma, hernia repair, cardiac cath  PRECAUTIONS: Fall, ICD/Pacemaker, and Other: poor vision, STM impairments  WEIGHT BEARING RESTRICTIONS: No  PAIN:  Are you having pain? Yes: NPRS scale: 6/10 Pain location: low back, neck, B knees Pain description: sore Aggravating factors: inactivity Relieving factors: Tylenol, rest  FALLS: Has patient fallen in last 6 months? Yes. Number of falls 2-3, getting in/out of bed or when turning to pick something up   LIVING ENVIRONMENT: Lives with: lives with their family (daughter comes and goes, will spend a few days per week in the home with them).  Lives in: House/apartment Stairs: Yes: Internal: 14 steps; and External: 1 step to enter home  Has following equipment at home: Dan Humphreys - 2 wheeled, Environmental consultant - 4 wheeled, Tour manager, and Grab bars  PLOF: Independent and Independent with basic ADLs  PATIENT GOALS: improve endurance and engagement in ADLs  OBJECTIVE:   HAND DOMINANCE: Right  ADLs: Transfers/ambulation related to ADLs: Mod I to supervision with RW Eating: spouse typically cuts foods for him, pt will lean towards food instead of bringing food to mouth, spoon is preferred utensil Grooming: increased time UB Dressing: increased time, is ableto manage buttons LB Dressing: wears disposable underwear and pull on pants for increased ease Toileting: Mod I, does have a grab bar in front that he will pull up on; difficulty with hygiene post BM Bathing: spouse assists with bathing, requires encouragement to bathe Tub Shower transfers: spouse assists with transfers Equipment: Transfer tub bench and Grab bars  IADLs: Medication  management: spouse fills pill box and will remind him to take  MOBILITY STATUS: Needs Assist: Mod I to Supervision with RW and Hx of falls; required min assist to stand from chair without arm rests  POSTURE COMMENTS:  rounded shoulders, forward head, decreased lumbar lordosis, increased thoracic kyphosis, and right head tilt  ACTIVITY TOLERANCE: Activity tolerance: diminished  UPPER EXTREMITY ROM:    Active ROM Right eval Left eval  Shoulder flexion 90 110  Shoulder abduction    Shoulder adduction    Shoulder extension    Shoulder internal rotation 80% 70%  Shoulder external rotation 80% 80%  Elbow flexion    Elbow extension    Wrist flexion    Wrist extension    Wrist ulnar deviation    Wrist radial deviation    Wrist pronation    Wrist supination    (Blank rows = not tested)  HAND FUNCTION: 03/29/23 Grip strength: Right: 45, 40, 36 lbs; Left: 41, 36, 33 lbs  COORDINATION: 03/22/23 9 Hole Peg test: Right: 53.19 sec; Left: 1:17.28 sec Box and Blocks:  Right 26 blocks, Left 23 blocks  COGNITION: Overall cognitive status: History of cognitive impairments - at baseline Brief Interview for Mental Status:   Memory:  Word Repetition:   "Sock"  "Blue"  "Bed"   Orientation Level:    Year:   2024  Month: Sept  Day of Week: Weds  Memory:  Recall:     Unable to recall "Sock"  with cue   Able to recall  "Blue"  without cue   Able to recall  "Bed"   with cue     No Recall   VISION: Subjective report: diplopia.  Recently went to ophthalmologist who applied a Fresnel prism 8 base in on the right lens of his readers. Baseline vision: Wears glasses for reading only  VISION ASSESSMENT: Impaired To be further assessed in functional context   TODAY'S TREATMENT:                                                                        DATE:  05/10/23 OT educated pt on memory compensation strategies and "keep thinking skills sharp" activities to improve memory and  cognition. Pt and pt's spouse verbalized understanding. Handouts provided to pt's spouse. Pt reported  some "keep thinking skills sharp" activities look "fun and enjoyable." Pt reported often forgetting walker when ambulating to bathroom. Pt, pt's spouse, and OT discussed adding bright tape to pt's walker and keeping walker in field of view to improve safety and improve attention to walker. Pt agreed and verbalized understanding.  OT educated pt and pt's spouse on potential D/C from OT, OT role, role of therapy to provide tools to increase success and ind at home. Pt and pt's spouse verbalized understanding.   OT assessed pt's progress towards goals. Pt met 5 out of 7 goals with one goal deferred d/t pt removing lenses of temporary glasses and permanent glasses not yet arrived. Pt's spouse provides cues and assistance for task modification PRN. Pt's spouse verbalized understanding of HEP, task modification. OT educated pt and pt's spouse on progress towards goals. Pt and pt's spouse acknowledged understanding of all.  OT educated pt and pt's spouse on continuing to provide cues PRN, building HEP into daily routine, prioritizing HEP when pt has more energy (e.g. complete exercises in morning after waking up), completing standing tasks during TV commercial breaks, safety with walker. Pt and pt's spouse verbalized understanding. Pt sometimes expressed frustration d/t OT recommendations sometimes differing from pt's preferred routine. OT provided verbal reassurance and education regarding OT role to help find solutions that pt can adjust to help build recommendations into pt's routine. Pt agreed and verbalized understanding.  OT educated pt and pt's spouse on continuing with provided HEP to reinforce education and HEP compliance and to complete exercises consistently. Pt and pt's spouse verbalized understanding.  Pt and pt's spouse acknowledged understanding of OT D/C and agreed with D/C from occupational  therapy today.  Pt's spouse requested additional copy of pt instructions and exercises. Therefore, OT provided additional copy of bag exercises, shoulder and piriformis HEP, memory compensation strategies, and "keep thinking skills sharp" activities.  PATIENT EDUCATION: Education details: see treatment note above Person educated: Patient and Spouse Education method: Explanation and Handouts Education comprehension: verbalized understanding, returned demonstration, verbal cues required, and needs further education  HOME EXERCISE PROGRAM: Access Code: [email or web address] URL: [email or web address] Date: 04/26/2023 Prepared by: [facility] - Outpatient  Rehab - [facility]  Exercises - Seated Shoulder Horizontal Abduction with Resistance  - 2 x daily - 10 reps - 3-5 sec hold - Seated Shoulder Diagonal with Resistance  - 2 x daily - 10 reps - 3-5 sec hold - Seated Active Figure 4 Hip Flexion and External Rotation  - 2 x daily - 5 reps - 2-3 sec hold   05/10/23 - Memory compensation strategies (handout), keep thinking skills sharp (handout) - see pt instructions   GOALS: Goals reviewed with patient? Yes  SHORT TERM GOALS: Target date: 04/14/23  Pt and caregiver will be independent with gentle ROM HEP for improved functional use of BUE. Baseline: Goal status: 05/10/23 - MET 05/03/23 - Pt required modeling, v/c and tactile cues for HEP. Caregivers ind demo'd understanding of ROM/UE HEP.  05/10/23 - Pt's caregivers reported understanding of HEP. Pt completes HEP with cues from caregivers.  2.  Pt will verbalize understanding of task modifications and/or potential AE needs to increase ease, safety, and independence w/ ADLs Baseline:  Goal status: 05/10/23  - IN PROGRESS 05/03/23 - Pt's spouse reported using transfer tub bench for bathing to increase ind. 05/10/23 - Pt's spouse verbalized understanding of task modifications and A/E. Pt reported understanding of some tasks.  3.  Pt  will verbalize understanding of use of  new glasses with Fresnel lenses for increased visual perception as needed for ADLs and IADLs. Baseline:  Goal status:  Deferred 05/03/23 - Pt reported lenses have not been processed so glasses not yet available. Pt's daughter reported a temporary pair was provided and pt is testing them for reading. 05/10/23 - Pt reported using temporary pair of glasses and then pt removed the lenses. Therefore, temporary glasses no longer in use. Pt continuing to wait for main pair of glasses to arrive. Pt reported intent to replace lenses of glasses when going home today.   LONG TERM GOALS: Target date: 05/12/23  Pt will demonstrate and/or report ability to complete LB dressing to include footwear at Mod I level utilizing adaptive techniques and/or AE PRN. Baseline:  Goal status: 05/10/23 - MET 05/03/23 - Pt sits on bench and "works leg up" to put shoes on. Pt demo's increased difficulty with moving LUE compared to RUE. 05/10/23 - Pt and pt's spouse reports pt ind dons socks/shoes.  2.  Pt will be able to complete toileting hygiene at Mod I level with adaptive techniques and/or AE PRN. Baseline:  Goal status: MET 05/03/23 - Pt's spouse reports pt completes toileting hygiene ind using grab bar and walker for balance support.   3.  Pt will demonstrate improved internal rotation as needed for clothing management and hygiene post toileting. Baseline:  Goal status: MET 05/03/23 - Pt demo's good BUE internal rotation to reach to low back. Pt reported improved ability to don shirt when putting R sleeve first and then using internal rotation of LUE to pull shirt around to L side.  4.  Pt will demonstrate improved sustained attention to task with ability to complete table top task for 10 mins with 1 or fewer cues for attention to task. Baseline:  Goal status: 05/10/23 - MET 05/03/23 - Pt participated in tasks with mod v/c to attend to tasks. 05/10/23 - Pt continues to require  some cues for non-preferred tasks and pt's spouse reports pt demonstrates good sustained attention for preferred tasks (e.g. reading papers, brochures).  ASSESSMENT:  CLINICAL IMPRESSION:  Pt continues to benefit from cues for safety and HEP compliance though reports increased ind with daily ADL/IADL tasks. Pt D/C from OT today d/t pt making good progress towards goals and reaching max rehab potential. Pt and pt's spouse agreed with OT D/C.   PERFORMANCE DEFICITS: in functional skills including ADLs, IADLs, ROM, pain, flexibility, Gross motor control, balance, body mechanics, endurance, and decreased knowledge of use of DME, cognitive skills including attention, memory, problem solving, and safety awareness, and psychosocial skills including coping strategies, environmental adaptation, and habits.     PLAN:  OT FREQUENCY: 2x/week  OT DURATION: 8 weeks  PLANNED INTERVENTIONS: self care/ADL training, therapeutic exercise, therapeutic activity, neuromuscular re-education, passive range of motion, balance training, functional mobility training, ultrasound, compression bandaging, moist heat, cryotherapy, patient/family education, cognitive remediation/compensation, visual/perceptual remediation/compensation, psychosocial skills training, energy conservation, coping strategies training, and DME and/or AE instructions  RECOMMENDED OTHER SERVICES: NA  CONSULTED AND AGREED WITH PLAN OF CARE: Patient and family member/caregiver  PLAN FOR NEXT SESSION:   N/A d/t pt D/C from occupational therapy today.   Wynetta Emery, OTR/L 05/10/2023, 4:13 PM

## 2023-05-10 NOTE — Therapy (Signed)
OUTPATIENT PHYSICAL THERAPY NEURO TREATMENT and D/C Summary   Patient Name: Jeff Weaver MRN: [id number] DOB:Jul 06, 1945, 77 y.o., male Today's Date: 05/10/2023   PCP: Pincus Sanes, MD REFERRING PROVIDER: Pincus Sanes, MD   PHYSICAL THERAPY DISCHARGE SUMMARY  Visits from Start of Care: 15  Current functional level related to goals / functional outcomes: Able to partially meet STG/LTG. Low risk for falls per [employer], high risk for falls per 5xSTS test   Remaining deficits: Safety awareness deficits with ambulation   Education / Equipment: HEP and info for obtaining U-step rolling walker   Patient agrees to discharge. Patient goals were partially met. Patient is being discharged due to being pleased with the current functional level.  END OF SESSION:  PT End of Session - 05/10/23 1536     Visit Number 15    Number of Visits 25    Date for PT Re-Evaluation 05/15/23    Authorization Type MCR/[employer]; 04/24/2023-----------------------Pt is KX---------------------    Authorization Time Period 02/20/23 to 05/15/23    Progress Note Due on Visit 20    PT Start Time 1530    PT Stop Time 1615    PT Time Calculation (min) 45 min    Equipment Utilized During Treatment Gait belt    Activity Tolerance Patient tolerated treatment well    Behavior During Therapy WFL for tasks assessed/performed                  Past Medical History:  Diagnosis Date   Aortic insufficiency    Atypical chest pain    Cardiac resynchronization therapy pacemaker (CRT-P) in place    Cerebral aneurysm    a. s/p clipping at [facility] in 8/13 c/b short-term memory loss, cerebral hemorrhage and seizure disorder s/p VP shunt.   Cerebral hemorrhage (HCC)    Chronic combined systolic and diastolic CHF (congestive heart failure) (HCC)    CVA (cerebral infarction)    Diverticulosis    Enteritis (regional)    Dr Juanda Chance   First degree AV block    GI bleed    HTN (hypertension)     Hyperlipidemia    Hypertensive heart disease    IBS (irritable bowel syndrome)    Lewy body dementia (HCC)    Mild CAD    Cardiac cath 01/12/18 showed minimal nonobstructive CAD, normal LVEF, normal LVEDP.   Mild dilation of ascending aorta (HCC)    Mitral regurgitation    NSVT (nonsustained ventricular tachycardia) (HCC)    Orthostatic hypotension    Pacemaker    Pre-diabetes    PVC's (premature ventricular contractions)    Regional enteritis of large intestine (HCC) since 1978   Rheumatoid arthritis(714.0)    dxed in [employer] in 1980s   Second degree AV block, Mobitz type I    Seizures (HCC)    last April 26,2021   Sinus bradycardia    Sleep apnea    SSS (sick sinus syndrome) (HCC)    Subdural hematoma (HCC)    Past Surgical History:  Procedure Laterality Date   BIV PACEMAKER INSERTION CRT-P N/A 03/10/2021   Procedure: BIV PACEMAKER INSERTION CRT-P UPGRADE;  Surgeon: Regan Lemming, MD;  Location: [facility] INVASIVE CV LAB;  Service: Cardiovascular;  Laterality: N/A;   CHOLECYSTECTOMY N/A 01/07/2016   Procedure: LAPAROSCOPIC CHOLECYSTECTOMY ;  Surgeon: Abigail Miyamoto, MD;  Location: [facility] OR;  Service: General;  Laterality: N/A;   cns shunt  02/23/12   COLONOSCOPY W/ POLYPECTOMY  1978   negative 2009,  Dr Juanda Chance. Due 2014   CRANIOTOMY  02/02/12   Dr Kelby Aline, [facility]-clipping of aneurysm   CRANIOTOMY  02-02-12   left pterional craniotomy for clipping complex anterior communicating artery aneurysm    HERNIA REPAIR     LEFT HEART CATH AND CORONARY ANGIOGRAPHY N/A 01/12/2018   Procedure: LEFT HEART CATH AND CORONARY ANGIOGRAPHY;  Surgeon: Swaziland, Peter M, MD;  Location[facility] INVASIVE CV LAB;  Service: Cardiovascular;  Laterality: N/A;   LEFT HEART CATHETERIZATION WITH CORONARY ANGIOGRAM N/A 11/26/2013   Procedure: LEFT HEART CATHETERIZATION WITH CORONARY ANGIOGRAM;  Surgeon: Marykay Lex, MD;  Location: [facility] CATH LAB;  Service: Cardiovascular;  Laterality: N/A;   NOSE SURGERY     PACEMAKER IMPLANT  N/A 07/02/2018   Procedure: PACEMAKER IMPLANT;  Surgeon: Regan Lemming, MD;  Location: [facility] INVASIVE CV LAB;  Service: Cardiovascular;  Laterality: N/A;   SHOULDER SURGERY  1997   TONSILLECTOMY AND ADENOIDECTOMY     VENTRICULOPERITONEAL SHUNT  02-23-12   INSERTION OF RIGHT FRONTAL VENTRICULOPERITONEAL SHUNT WITH A CODMAN HAKIM PROGRAMMABLE VALVE   Patient Active Problem List   Diagnosis Date Noted   Benign paroxysmal positional vertigo of right ear 03/03/2023   COVID-19 03/03/2023   Abrasion, forearm with infection, left, initial encounter 03/03/2023   Physical deconditioning 12/20/2022   Poor balance 12/20/2022   PVC's (premature ventricular contractions) 09/19/2022   Diplopia 09/19/2022   Parkinsonism (HCC) 06/19/2022   Primary localized osteoarthrosis of shoulder region 05/10/2022   Recurrent dislocation of shoulder joint 05/10/2022   Regional enteritis (HCC) 05/10/2022   Vestibular hypofunction of left ear 05/10/2022   Sensorineural hearing loss 05/10/2022   Senile osteoporosis 02/03/2022   Pain in right hip 01/05/2022   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   Intertrigo 10/01/2021   HFrEF (heart failure with reduced ejection fraction) (HCC) 09/18/2021   Blepharitis of upper and lower eyelids of both eyes 09/18/2021   Tinea cruris 09/18/2021   Lewy body dementia (HCC) 09/16/2021   Closed fracture of right hip (HCC) 09/16/2021   S/P hip replacement, right  07/2021 09/16/2021   Presence of right artificial hip joint 08/06/2021   Vascular dementia (HCC) 04/29/2021   Multiple falls 04/29/2021   Cough 04/09/2021   Vitamin D deficiency 04/09/2021   Vitamin B1 deficiency 04/09/2021   Right arm pain 01/06/2021   Seizures (HCC)    Muscle cramping 12/30/2019   Dizziness 12/30/2019   Unsteady gait 12/30/2019   Pacemaker 11/27/2019   Fatigue 07/09/2019   SSS (sick sinus syndrome) (HCC) 07/02/2018   Bilateral shoulder pain 06/14/2018   Upper airway cough syndrome vs cough variant  asthma 03/20/2018   Acute midline low back pain without sciatica 12/25/2017   Depression 10/17/2017   CAD (coronary artery disease), hx of NSTEMI 08/15/2017   Chest pain 07/17/2017   SOBOE (shortness of breath on exertion) 06/08/2017   Bilateral sensorineural hearing loss 06/01/2017   Nasal turbinate hypertrophy 06/01/2017   Dysphagia 05/31/2017   Osteoporosis 01/30/2017   History of cerebral hemorrhage 12/28/2016   Seborrheic dermatitis 07/25/2016   Cognitive communication deficit 06/28/2016   Hypertensive heart disease 06/28/2016   Vitamin B 12 deficiency 03/16/2016   Confusion 03/13/2016   VP (ventriculoperitoneal) shunt status 02/12/2016   Orthostatic hypotension 01/05/2016   Benign prostatic hyperplasia with urinary obstruction 08/04/2014   Urinary urgency 08/04/2014   Gastroesophageal reflux disease 07/14/2014   Polymyalgia rheumatica (HCC) 05/28/2014   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/2014   SBE (subacute bacterial endocarditis) prophylaxis candidate 11/24/2013   Aphasia 07/17/2013  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 seizures, not intractable, without status epilepticus (HCC) 07/17/2013   Subdural hemorrhage (HCC) 07/17/2013   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HCC) 07/17/2013   Pre-diabetes 03/26/2013   Urinary frequency 10/31/2012   Nocturia 10/31/2012   History of cerebral aneurysm repair 10/18/2012   History of stroke without residual deficits 10/18/2012   Ocular rosacea 09/27/2012   Seizure disorder (HCC) 07/23/2012   Entropion 07/10/2012   Hyperopia with astigmatism 07/10/2012   Combined senile cataract 04/13/2012   Presbyopia 04/13/2012   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) 03/01/2012   Stroke (HCC) 03/01/2012   Cognitive safety issue 02/20/2012   Nonruptured cerebral aneurysm 11/23/2011   Obstructive sleep apnea syndrome 08/09/2011   Hyperlipidemia 02/03/2009   HTN (hypertension) 02/03/2009   History of  Crohn's disease 04/17/2008   Allergic rhinitis, unspecified 07/05/1999   Cataract, right eye 07/05/1999    ONSET DATE: chronic   REFERRING DIAG: R26.89 (ICD-10-CM) - Poor balance R53.81 (ICD-10-CM) - Physical deconditioning  THERAPY DIAG:  Other symptoms and signs involving cognitive functions following unspecified cerebrovascular disease  Abnormal posture  Muscle weakness (generalized)  Other abnormalities of gait and mobility  Unsteadiness on feet  Rationale for Evaluation and Treatment: Rehabilitation  SUBJECTIVE:                                                                                                                                                                                             SUBJECTIVE STATEMENT: Doing ok, no new issues Pt accompanied by: significant other  PERTINENT HISTORY: Aortic insufficiency, CRT-P pacemaker placement, cerebral aneurysm s/p clipping, cerebral hemorrhage and seizure disorder s/p VP shunt, CHF, CVA, second degree AV block Mobitz type 1 s/p PPM, HTN, HLD, hypertensive heart disease, IBS, lew body dementia, mitral regurgitation, NSVT, orthostatic hypotension, RA, SSS, subdural hematoma, hernia repair, cardiac cath  PAIN:  Are you having pain? Yes: NPRS scale: 7-8/10 Pain location: right side of neck, left back Pain description: sore, like a cramp Aggravating factors: movement, neck extension Relieving factors: rest  PRECAUTIONS: Fall and ICD/Pacemaker, poor vision, STM impairments  RED FLAGS: None   WEIGHT BEARING RESTRICTIONS: No  FALLS: Has patient fallen in last 6 months? Yes. Number of falls 2 in the past 6 months, had additional falls >6 months ago; per spouse, falls a lot when bending to pick something up off the floor, fall with SDH and hip fracture in 2023 was due to slipping on wet grass outside   LIVING ENVIRONMENT: Lives with: lives with their family Lives in: House/apartment Stairs: Yes: Internal: 14 steps; one  step to  enter home  Has following equipment at home: Single point cane and "semi-hospital bed that adjusts the head, walker with 2 wheels, 4WW and WC, shower bench"   PLOF: Independent, Independent with basic ADLs, Requires assistive device for independence, and handheld shower head; per spouse "house is older and difficult to renovate, may try to convert bathroom to a shower stall  PATIENT GOALS: improve strength, balance, endurance   OBJECTIVE:   TODAY'S TREATMENT: 05/10/23 Activity Comments  STG/LTG review   Gait training w/ U-step Demo to family techniques to improve proximity of AD and safety features of AD to improve safety and endurance for ambulation---good teachback                 TODAY'S TREATMENT: 05/08/23 Activity Comments  Gait training performed with U-step and conversational dual-tasking. Seated rest breaks between trials due to fatigue -2 minute trial w/ U-step x 225 ft, two instances of cues for regaining postural control -4 minute trial w/ yellow t-band tethered to U-step x 400 ft--improved postural control onset of LE fatigue -6 minute trial same conditions, cues in foot clearance -4 minute trial same conditions                      PATIENT EDUCATION: (SELF CARE) Education details:pros/cons of U-step rollator; other options for assistive device in the community that he could safely use as walker or seat for rest break (and pt wants to be able to propel with feet) Person educated: Patient and Spouse, daughter Education method: Explanation, Demonstration, Tactile cues, Verbal cues, and Handouts Education comprehension: verbalized understanding and returned demonstration   HOME EXERCISE PROGRAM: Access Code: [email or web address] URL: [email or web address] Date: 03/13/2023 Prepared by: [facility] Outpatient  Rehab - [facility]  Program Notes walking program: practice walking with the walker for 5 minutes inside the house every hour    Exercises - Seated March with Resistance  - 1 x daily - 5 x weekly - 2 sets - 10 reps - Seated Long Arc Quad  - 1 x daily - 5 x weekly - 3 sets - 10 reps - Seated Ankle Dorsiflexion AROM  - 1 x daily - 7 x weekly - 3 sets - 10 reps - 3 sec hold - Seated Heel Raise  - 1 x daily - 5 x weekly - 2 sets - 10 reps - Cervical Extension AROM with Strap  - 1 x daily - 7 x weekly - 3 sets - 10 reps - Side to Side Weight Shift with Overhead Reach and Counter Support  - 1 x daily - 7 x weekly - 3 sets - 10 reps - Standing Gastroc Stretch at Counter  - 1 x daily - 7 x weekly - 3 sets - 30-60 sec hold - Supine Isometric Neck Extension  - 1 x daily - 7 x weekly - 3 sets - 10 reps - 2 sec hold  Added: sitting PWR! Moves handout  Use of boomwhackers/paper towel roll with twist and step 10x each    ----------------------------------------------------- Objective measures below taken at initial evaluation:  COGNITION: Overall cognitive status: History of cognitive impairments - at baseline       POSTURE: rounded shoulders, forward head, decreased lumbar lordosis, and increased thoracic kyphosis, flexed at hips     LOWER EXTREMITY MMT:    MMT Right Eval Left Eval  Hip flexion 3 3  Hip extension    Hip abduction 3 seated  3 seated  Hip adduction  Hip internal rotation    Hip external rotation    Knee flexion    Knee extension 4 4  Ankle dorsiflexion 3+ 3+  Ankle plantarflexion    Ankle inversion    Ankle eversion    (Blank rows = not tested)    TRANSFERS: Assistive device utilized: Single point cane  Sit to stand: SBA Stand to sit: SBA Chair to chair: SBA      GAIT: Gait pattern: step through pattern, decreased arm swing- Right, decreased arm swing- Left, decreased step length- Right, decreased step length- Left, decreased stance time- Right, decreased stance time- Left, decreased stride length, decreased ankle dorsiflexion- Right, decreased ankle dorsiflexion- Left, Right  foot flat, Left foot flat, shuffling, trendelenburg, decreased trunk rotation, trunk flexed, and wide BOS Distance walked: in clinic distances  Assistive device utilized: Single point cane Level of assistance: SBA Comments: slow and steady with SPC   FUNCTIONAL TESTS:  5 times sit to stand: 58 seconds use of UEs on chair  Timed up and go (TUG): 44 seconds, SPC  Gait speed 0.14 m/s with SPC   PATIENT SURVEYS:  FOTO next visit       GOALS: Goals reviewed with patient? Yes  SHORT TERM GOALS: Target date: 04/03/2023    Will be compliant with appropriate progressive HEP with no more than MinA from family  Baseline: Goal status: MET  2.  Will improve gait speed to at least 0.60m/s with LRAD  Baseline: 0.3 w/ RW Goal status: MET  3.  Will complete TUG test in 30 seconds or better with LRAD  Baseline: 48 sec; 35 sec Goal status: NOT MET  4.  Will complete 5xSTS test in 45 seconds or less with UEs  Baseline: 60 sec Goal status: NOT MET  5.  Will tolerate walking and/or activity in PT for at least 10 minutes without needing a rest break  Baseline:  Goal status: MET    LONG TERM GOALS: Target date: 05/15/2023    MMT to improve by one grade in all weak groups  Baseline: 5/5 gross strength Goal status: MET  2.  Will be able to ambulate at least 327ft in (0.45m/s) with LRAD to show improved community access no rest breaks   Baseline: (04/20/23) 300 ft Goal status: MET  3.  Will score at least 42 on the Berg to show reduced fall risk  Baseline: 47/56 Goal status: MET  4.  Will complete 5xSTS test in 20 seconds or less with UEs to show improved functional mobility  Baseline: (04/20/23) 23 sec; (05/10/23) 25 sec Goal status: NOT MET  5.  Will be compliant with appropriate progressive long term exercise plan with no more than MinA from family to combat sedentary lifestyle and maintain functional gains from PT  Baseline: w/ family support/supervision Goal  status:MET    ASSESSMENT:  CLINICAL IMPRESSION: Review of POC details demo low risk for falls per [facility] Test and overall improved performance from baseline 5xSTS test.  Sessions have focused on training in various AD to improve safety with transfers and gait.  Demonstrates best performance for sustained walking/ambulation w/ U-step walker and resistance band attached to belt to promote increased kinesthetic awareness to decrease tendency for festination/anterior LOB tolerating increase time/distances up to 6 min before fatigue requires seated rest period.  Pt and family educated in obtaining U-step for home use to improve safety with transfers and gait.  Will D/C to HEP at this time.  OBJECTIVE IMPAIRMENTS: Abnormal gait, decreased  activity tolerance, decreased balance, decreased cognition, decreased coordination, decreased knowledge of use of DME, decreased mobility, difficulty walking, decreased strength, decreased safety awareness, postural dysfunction, and obesity.   ACTIVITY LIMITATIONS: lifting, bending, standing, squatting, stairs, transfers, bed mobility, and locomotion level  PARTICIPATION LIMITATIONS: meal prep, cleaning, shopping, community activity, yard work, and church  PERSONAL FACTORS: Age, Fitness, Past/current experiences, Social background, and Time since onset of injury/illness/exacerbation are also affecting patient's functional outcome.   REHAB POTENTIAL: Good  CLINICAL DECISION MAKING: Evolving/moderate complexity  EVALUATION COMPLEXITY: Moderate  PLAN:  PT FREQUENCY: 2x/week  PT DURATION: 12 weeks  PLANNED INTERVENTIONS: Therapeutic exercises, Therapeutic activity, Neuromuscular re-education, Balance training, Gait training, Patient/Family education, Self Care, Stair training, DME instructions, Aquatic Therapy, Dry Needling, Cognitive remediation, Cryotherapy, Moist heat, Manual therapy, and Re-evaluation  PLAN FOR NEXT SESSION: D/C to HEP  3:37 PM,  05/10/23 M. Shary Decamp, PT, DPT Physical Therapist- [place] Office Number: [phone]

## 2023-05-16 ENCOUNTER — Ambulatory Visit: Payer: Medicare Other

## 2023-05-22 DIAGNOSIS — B351 Tinea unguium: Secondary | ICD-10-CM | POA: Diagnosis not present

## 2023-05-22 DIAGNOSIS — M79672 Pain in left foot: Secondary | ICD-10-CM | POA: Diagnosis not present

## 2023-05-22 DIAGNOSIS — M79671 Pain in right foot: Secondary | ICD-10-CM | POA: Diagnosis not present

## 2023-05-26 ENCOUNTER — Ambulatory Visit (INDEPENDENT_AMBULATORY_CARE_PROVIDER_SITE_OTHER): Payer: Medicare Other

## 2023-05-26 DIAGNOSIS — I428 Other cardiomyopathies: Secondary | ICD-10-CM

## 2023-05-26 LAB — CUP PACEART REMOTE DEVICE CHECK
Battery Remaining Longevity: 105 mo
Battery Voltage: 2.99 V
Brady Statistic AP VP Percent: 92.56 %
Brady Statistic AP VS Percent: 0.01 %
Brady Statistic AS VP Percent: 7.3 %
Brady Statistic AS VS Percent: 0.13 %
Brady Statistic RA Percent Paced: 92.61 %
Brady Statistic RV Percent Paced: 99.86 %
Date Time Interrogation Session: 20241122005654
Implantable Lead Connection Status: 753985
Implantable Lead Connection Status: 753985
Implantable Lead Connection Status: 753985
Implantable Lead Implant Date: 20191230
Implantable Lead Implant Date: 20191230
Implantable Lead Implant Date: 20220907
Implantable Lead Location: 753858
Implantable Lead Location: 753859
Implantable Lead Location: 753860
Implantable Lead Model: 4598
Implantable Lead Model: 5076
Implantable Lead Model: 5076
Implantable Pulse Generator Implant Date: 20220907
Lead Channel Impedance Value: 304 Ohm
Lead Channel Impedance Value: 380 Ohm
Lead Channel Impedance Value: 380 Ohm
Lead Channel Impedance Value: 399 Ohm
Lead Channel Impedance Value: 418 Ohm
Lead Channel Impedance Value: 437 Ohm
Lead Channel Impedance Value: 475 Ohm
Lead Channel Impedance Value: 494 Ohm
Lead Channel Impedance Value: 589 Ohm
Lead Channel Impedance Value: 684 Ohm
Lead Channel Impedance Value: 684 Ohm
Lead Channel Impedance Value: 722 Ohm
Lead Channel Impedance Value: 741 Ohm
Lead Channel Impedance Value: 779 Ohm
Lead Channel Pacing Threshold Amplitude: 0.75 V
Lead Channel Pacing Threshold Amplitude: 0.75 V
Lead Channel Pacing Threshold Amplitude: 1.125 V
Lead Channel Pacing Threshold Pulse Width: 0.4 ms
Lead Channel Pacing Threshold Pulse Width: 0.4 ms
Lead Channel Pacing Threshold Pulse Width: 0.4 ms
Lead Channel Sensing Intrinsic Amplitude: 1.75 mV
Lead Channel Sensing Intrinsic Amplitude: 1.75 mV
Lead Channel Sensing Intrinsic Amplitude: 15.75 mV
Lead Channel Sensing Intrinsic Amplitude: 15.75 mV
Lead Channel Setting Pacing Amplitude: 1.25 V
Lead Channel Setting Pacing Amplitude: 1.5 V
Lead Channel Setting Pacing Amplitude: 2.5 V
Lead Channel Setting Pacing Pulse Width: 0.4 ms
Lead Channel Setting Pacing Pulse Width: 0.4 ms
Lead Channel Setting Sensing Sensitivity: 1.2 mV
Zone Setting Status: 755011
Zone Setting Status: 755011

## 2023-05-29 ENCOUNTER — Ambulatory Visit: Payer: Medicare Other

## 2023-05-29 NOTE — Therapy (Signed)
[place] [facility] [address] [place], [place], [address] Phone: [phone]   Fax:  [phone]  Patient Details  Name: Jeff Weaver MRN: [id number] Date of Birth: [birth year] Referring Provider:  No ref. provider found  Encounter Date: 05/29/2023  SPEECH THERAPY DISCHARGE SUMMARY  Visits from Start of Care: 11  Current functional level related to goals / functional outcomes: Pt wife called to cx pt's last scheduled appointment and did not reschedule. Assumed that pt desires discharge from ST at this time. She agreed it would be good for pt to arrive to one more ST appointment, during his last ST appointment.  Goals and plan at pt's last scheduled appointment on 05/08/23 are below.   GOALS: Goals reviewed with patient? Yes (in general)   SHORT TERM GOALS: Target date: 04/14/23   Pt will complete MBS to assess current swallowing status and assess safe swallow strategies Baseline: Goal status: Met   2.  Pt will maintain average mid-upper 80s for loud /a/ in 3 sessions Baseline:  Goal status: deferred (to LTGs)   3.  Pt will use speech volume average upper 60s dB for 5 minutes simple conversation in two sessions Baseline:  Goal status: not met   4.  pt will generate abdominal breathing at rest (seated position) 80% of the time over 2 sessions Baseline:  Goal status: DEFERRED     LONG TERM GOALS: Target date: 05/12/23   Pt will improve PROM measure scores in the last 1-2 sessions Baseline:  Goal status: INITIAL   2.  Pt will maintain average upper 80s for loud /a/ in 3 sessions Baseline:  Goal status: DEFERRED    3.  Pt will use speech volume average low 70s dB for 8 minutes simple-mod complex  conversation with occasional verbal cues in two sessions Baseline:  Goal status: DEFERRED   4.   Pt will generate abdominal breathing 60% of the time in 8 minutes simple-mod complex conversation over 2  sessions Baseline:  Goal status: DEFERRED   5.  Pt will complete swallowing HEP with occasional min A in 3 sessions             Baseline:05/03/23, 05/08/23             Goal status: INITIAL   6. Pt will maintain average mid-upper 80s for loud /a/ in 3 sessions Baseline:  Goal status: DEFERRED     ASSESSMENT:   CLINICAL IMPRESSION: Patient is a 77 y.o. M who was seen today for ST in light of Parkinsonism and other complex medical hx. See "today's treatment" for more details. Pt's OT to target compensations for cognition/memory. Given pt's performance today and in recent sessions, suspect many of pt's current cognitive communication deficits are due to compounded neurological insults with overlying dx of Lewy body dementia. SLP to focus on swallowing goals, mainly, determining today that pt's speech volume has improved in that family has to ask less for Daishawn to repeat. OBJECTIVE IMPAIRMENTS: Objective impairments include memory, dysarthria, and dysphagia. These impairments are limiting patient from ADLs/IADLs, effectively communicating at home and in community, and safety when swallowing.Factors affecting potential to achieve goals and functional outcome are ability to learn/carryover information and co-morbidities.. Patient will benefit from skilled SLP services to address above impairments and improve overall function.   REHAB POTENTIAL: Fair due to medical hx   PLAN:   SLP FREQUENCY: 2x/week   SLP DURATION: 8 weeks   PLANNED INTERVENTIONS: Aspiration  precaution training, Pharyngeal strengthening exercises, Diet toleration management , Environmental controls, Trials of upgraded texture/liquids, Internal/external aids, Oral motor exercises, Functional tasks, SLP instruction and feedback, Compensatory strategies, and Patient/family education    Remaining deficits: Dysphagia, cognitive communication deficits   Education / Equipment: See therapy notes for details.    Patient agrees to  discharge. Patient goals were partially met. Patient is being discharged due to being pleased with the current functional level.. Pt should be scheduled for a follow up MBS if he has completed HEP for dysphagia as prescribed by SLP. Should pt develop overt s/sx aspiration PNA or more pronounced dysphagia, he should undergo MBS.     Roniel Halloran, CCC-SLP 05/29/2023, 11:23 PM  [place] [facility] [address] [place], [place], [address] Phone: [phone]   Fax:  [phone]

## 2023-06-11 ENCOUNTER — Encounter: Payer: Self-pay | Admitting: Internal Medicine

## 2023-06-11 NOTE — Progress Notes (Unsigned)
Subjective:    Patient ID: Jeff Weaver, male    DOB: Sep 24, 1945, 77 y.o.   MRN: [id number]     HPI Jeff Weaver is here for follow up of his chronic medical problems.  Back pain-has chronic lower back pain.  Also has chronic bilateral knee pain and right shoulder pain.  That his family are wondering about treatment-pain management.  He does take Tylenol as needed.  [facility] for [facility]-  Needs help w/ getting in and out of car,  Bathing, dressing, hygiene needs, assistance some days in and out of chair, toileting, med management, food preparation and all other home activities.    Uses walker or cane with ambulation-can only ambulate less than 1 block  Confined to bed - sleeping - irregular pattern-sometimes sleeps in recliner  Legally blind - no   Loss of bowel/bladder - yes      Leaves home for doctors visits only.  He does not drive.  All activities need assistance from family.    LLE 3/5, RLE 4/5, neck dec extendtion, dec rom side to side.   Hands 4/5 .   Decrease upper arm     Medications and allergies reviewed with patient and updated if appropriate.  Current Outpatient Medications on File Prior to Visit  Medication Sig Dispense Refill   acetaminophen (TYLENOL) 325 MG tablet Take 325-650 mg by mouth every 6 (six) hours as needed (pain/headaches.).     carbidopa-levodopa (SINEMET IR) 25-100 MG tablet as directed.     cetirizine (ZYRTEC) 10 MG tablet Take 1 tablet (10 mg total) by mouth daily. 30 tablet 11   Cholecalciferol (VITAMIN D) 50 MCG (2000 UT) CAPS Take 2,000 Units by mouth in the morning.     donepezil (ARICEPT) 10 MG tablet Take half a tablet in the morning for a month and then increase to 1 full tablet daily. 30 tablet 5   finasteride (PROSCAR) 5 MG tablet Take 5 mg by mouth daily.     ipratropium (ATROVENT) 0.06 % nasal spray Place 2 sprays into the nose as needed for rhinitis. 15 mL 5   levETIRAcetam (KEPPRA) 500 MG tablet Take 3 tablets by  mouth in the morning and at bedtime.     loperamide (IMODIUM) 2 MG capsule Take 1 capsule (2 mg total) by mouth as needed for diarrhea or loose stools. 30 capsule 5   LORazepam (ATIVAN) 1 MG tablet Take 1 mg by mouth every 6 (six) hours as needed for seizure.      metoprolol succinate (TOPROL XL) 25 MG 24 hr tablet Take 0.5 tablets (12.5 mg total) by mouth daily. 45 tablet 3   Midazolam (NAYZILAM) 5 MG/0.1ML SOLN Place 1 each into the nose as needed (seizure).     midodrine (PROAMATINE) 2.5 MG tablet Take 1 tablet by mouth daily only as needed 30 tablet 11   mupirocin ointment (BACTROBAN) 2 % Apply 1 Application topically daily. 30 g 2   nitroGLYCERIN (NITROSTAT) 0.4 MG SL tablet PLACE 1 TABLET (0.4 MG TOTAL) UNDER THE TONGUE EVERY 5 (FIVE) MINUTES AS NEEDED FOR CHEST PAIN. 25 tablet 11   nystatin ointment (MYCOSTATIN) Apply 1 Application topically 2 (two) times daily. 60 g 3   Propylene Glycol (SYSTANE BALANCE) 0.6 % SOLN Apply 1 drop to eye daily as needed (dry eyes).     pyridOXINE (VITAMIN B-6) 100 MG tablet Take 100 mg by mouth in the morning.     rosuvastatin (CRESTOR) 5 MG  tablet Take 1 tablet (5 mg total) by mouth 3 (three) times a week. 45 tablet 3   Tamsulosin HCl (FLOMAX) 0.4 MG CAPS Take 0.4 mg by mouth every evening.     thiamine 100 MG tablet Take 100 mg by mouth in the morning.     torsemide (DEMADEX) 20 MG tablet Take 1 tablet (20 mg) by mouth alternating with 1/2 tablet (10 mg) by mouth every other day. 135 tablet 3   triamcinolone cream (KENALOG) 0.5 % Apply 1 Application topically as needed (break out prevention).     VIMPAT 200 MG TABS tablet Take 200 mg by mouth 2 (two) times daily.     vitamin B-12 (CYANOCOBALAMIN) 1000 MCG tablet Take 1,000 mcg by mouth in the morning.     clonazePAM (KLONOPIN) 1 MG disintegrating tablet Take 1 mg by mouth as directed. ALLOW 1 TABLET TO DISSOLVE IN CHEEK FOR SEIZURES LASTING LONGER THAN 4 MINUTES. OR AFTER 2ND SEIZURE IN 24 HOURS PRN     No  current facility-administered medications on file prior to visit.     Review of Systems     Objective:   Vitals:   06/12/23 1522  BP: 136/74  Pulse: 70  Temp: 98 F (36.7 C)  SpO2: 93%   BP Readings from Last 3 Encounters:  06/12/23 136/74  04/12/23 130/78  03/20/23 136/80   Wt Readings from Last 3 Encounters:  06/12/23 245 lb (111.1 kg)  04/12/23 241 lb 9.6 oz (109.6 kg)  03/20/23 244 lb (110.7 kg)   Body mass index is 37.25 kg/m.    Physical Exam Constitutional:      General: He is not in acute distress.    Appearance: Normal appearance. He is not ill-appearing.  HENT:     Head: Normocephalic and atraumatic.  Cardiovascular:     Rate and Rhythm: Normal rate and regular rhythm.  Pulmonary:     Effort: Pulmonary effort is normal. No respiratory distress.     Breath sounds: Normal breath sounds. No wheezing or rales.  Musculoskeletal:     Right lower leg: Edema (1+ pitting) present.     Left lower leg: Edema (1+ pitting) present.     Comments: Decreased extension of neck, full flexion of neck.  Slight decreased range of motion of neck side-to-side  Can raise arms 90%.  Slight decreased range of motion of lower extremities.  Hand strength 4/5.  Left lower extremity strength 3/5, right lower extremity strength 4/5  Skin:    General: Skin is warm and dry.     Findings: No rash.  Neurological:     Mental Status: He is alert. Mental status is at baseline.     Gait: Gait abnormal (Unsteady gait-uses walker, not able to lift right foot up completely, shuffling).        Lab Results  Component Value Date   WBC 9.8 01/12/2023   HGB 14.1 01/12/2023   HCT 41.7 01/12/2023   PLT 165.0 01/12/2023   GLUCOSE 101 (H) 01/24/2023   CHOL 125 03/24/2023   TRIG 94 03/24/2023   HDL 44 03/24/2023   LDLDIRECT 87.6 09/19/2012   LDLCALC 63 03/24/2023   ALT 37 03/24/2023   AST 24 03/24/2023   NA 138 01/24/2023   K 4.0 01/24/2023   CL 102 01/24/2023   CREATININE 0.93  01/24/2023   BUN 16 01/24/2023   CO2 28 01/24/2023   TSH 1.77 01/12/2023   INR 1.0 07/17/2017   HGBA1C 5.9 03/21/2022   MICROALBUR  1.0 11/13/2014     Assessment & Plan:    See Problem List for Assessment and Plan of chronic medical problems.

## 2023-06-12 ENCOUNTER — Ambulatory Visit (INDEPENDENT_AMBULATORY_CARE_PROVIDER_SITE_OTHER): Payer: Medicare Other | Admitting: Internal Medicine

## 2023-06-12 VITALS — BP 136/74 | HR 70 | Temp 98.0°F | Ht 68.0 in | Wt 245.0 lb

## 2023-06-12 DIAGNOSIS — I10 Essential (primary) hypertension: Secondary | ICD-10-CM | POA: Diagnosis not present

## 2023-06-12 DIAGNOSIS — B356 Tinea cruris: Secondary | ICD-10-CM

## 2023-06-12 DIAGNOSIS — R5381 Other malaise: Secondary | ICD-10-CM | POA: Diagnosis not present

## 2023-06-12 MED ORDER — NYSTATIN 100000 UNIT/GM EX OINT: 1.0000 | TOPICAL_OINTMENT | Freq: Two times a day (BID) | CUTANEOUS | 3 refills | Status: DC

## 2023-06-12 NOTE — Assessment & Plan Note (Signed)
Chronic On metoprolol 12.5 mg nightly Also on midodrine 2.5 mg  daily for orthostasis Goal for cardio is SBP less than 150

## 2023-06-12 NOTE — Patient Instructions (Signed)
    We will let you know when the paperwork is done.

## 2023-06-12 NOTE — Assessment & Plan Note (Signed)
Chronic Currently controlled Typically wears a diaper and does not always change them as regularly as he should Continue nystatin ointment as needed

## 2023-06-12 NOTE — Assessment & Plan Note (Signed)
Chronic Multifactorial Related to Parkinson disease, Lewy body dementia, chronic back pain, bilateral knee pain, right shoulder pain history of cerebral aneurysm s/p surgery, HFrEF, CAD and seizure disorder Needs family assistance with most ADLs

## 2023-06-15 NOTE — Progress Notes (Signed)
Remote pacemaker transmission.   

## 2023-06-19 ENCOUNTER — Telehealth: Payer: Self-pay

## 2023-06-19 DIAGNOSIS — Z0279 Encounter for issue of other medical certificate: Secondary | ICD-10-CM

## 2023-06-19 NOTE — Telephone Encounter (Signed)
Forms completed and left up front for pickup.  Called and left message for spouse that forms were ready.

## 2023-06-20 ENCOUNTER — Encounter: Payer: Self-pay | Admitting: Internal Medicine

## 2023-06-20 DIAGNOSIS — H5111 Convergence insufficiency: Secondary | ICD-10-CM | POA: Diagnosis not present

## 2023-06-20 DIAGNOSIS — H5010 Unspecified exotropia: Secondary | ICD-10-CM | POA: Diagnosis not present

## 2023-07-19 ENCOUNTER — Telehealth: Payer: Self-pay | Admitting: Physician Assistant

## 2023-07-19 NOTE — Telephone Encounter (Signed)
 After wife's visit today I received request to call them back about Jeff Weaver as well. They have a form they are going to be submitting for our review asking for medical opinion of contribution of exposure to agent orange in the [employer] to current cardiac status. I told them to drop it off at the front desk and askt registration to scan to my inbox for review. I will plan to discuss with Dr. Lawana Pray.

## 2023-07-20 ENCOUNTER — Ambulatory Visit: Payer: Medicare Other | Admitting: Physician Assistant

## 2023-07-25 NOTE — Progress Notes (Unsigned)
Subjective:    Patient ID: Jeff Weaver, male    DOB: 1945-08-17, 78 y.o.   MRN: [id number]      HPI Jeff Weaver is here for No chief complaint on file.        Medications and allergies reviewed with patient and updated if appropriate.  Current Outpatient Medications on File Prior to Visit  Medication Sig Dispense Refill   acetaminophen (TYLENOL) 325 MG tablet Take 325-650 mg by mouth every 6 (six) hours as needed (pain/headaches.).     carbidopa-levodopa (SINEMET IR) 25-100 MG tablet as directed.     cetirizine (ZYRTEC) 10 MG tablet Take 1 tablet (10 mg total) by mouth daily. 30 tablet 11   Cholecalciferol (VITAMIN D) 50 MCG (2000 UT) CAPS Take 2,000 Units by mouth in the morning.     clonazePAM (KLONOPIN) 1 MG disintegrating tablet Take 1 mg by mouth as directed. ALLOW 1 TABLET TO DISSOLVE IN CHEEK FOR SEIZURES LASTING LONGER THAN 4 MINUTES. OR AFTER 2ND SEIZURE IN 24 HOURS PRN     donepezil (ARICEPT) 10 MG tablet Take half a tablet in the morning for a month and then increase to 1 full tablet daily. 30 tablet 5   finasteride (PROSCAR) 5 MG tablet Take 5 mg by mouth daily.     ipratropium (ATROVENT) 0.06 % nasal spray Place 2 sprays into the nose as needed for rhinitis. 15 mL 5   levETIRAcetam (KEPPRA) 500 MG tablet Take 3 tablets by mouth in the morning and at bedtime.     loperamide (IMODIUM) 2 MG capsule Take 1 capsule (2 mg total) by mouth as needed for diarrhea or loose stools. 30 capsule 5   LORazepam (ATIVAN) 1 MG tablet Take 1 mg by mouth every 6 (six) hours as needed for seizure.      metoprolol succinate (TOPROL XL) 25 MG 24 hr tablet Take 0.5 tablets (12.5 mg total) by mouth daily. 45 tablet 3   Midazolam (NAYZILAM) 5 MG/0.1ML SOLN Place 1 each into the nose as needed (seizure).     midodrine (PROAMATINE) 2.5 MG tablet Take 1 tablet by mouth daily only as needed 30 tablet 11   mupirocin ointment (BACTROBAN) 2 % Apply 1 Application topically daily. 30 g 2   nitroGLYCERIN  (NITROSTAT) 0.4 MG SL tablet PLACE 1 TABLET (0.4 MG TOTAL) UNDER THE TONGUE EVERY 5 (FIVE) MINUTES AS NEEDED FOR CHEST PAIN. 25 tablet 11   nystatin ointment (MYCOSTATIN) Apply 1 Application topically 2 (two) times daily. 60 g 3   Propylene Glycol (SYSTANE BALANCE) 0.6 % SOLN Apply 1 drop to eye daily as needed (dry eyes).     pyridOXINE (VITAMIN B-6) 100 MG tablet Take 100 mg by mouth in the morning.     rosuvastatin (CRESTOR) 5 MG tablet Take 1 tablet (5 mg total) by mouth 3 (three) times a week. 45 tablet 3   Tamsulosin HCl (FLOMAX) 0.4 MG CAPS Take 0.4 mg by mouth every evening.     thiamine 100 MG tablet Take 100 mg by mouth in the morning.     torsemide (DEMADEX) 20 MG tablet Take 1 tablet (20 mg) by mouth alternating with 1/2 tablet (10 mg) by mouth every other day. 135 tablet 3   triamcinolone cream (KENALOG) 0.5 % Apply 1 Application topically as needed (break out prevention).     VIMPAT 200 MG TABS tablet Take 200 mg by mouth 2 (two) times daily.     vitamin B-12 (CYANOCOBALAMIN) 1000 MCG tablet  Take 1,000 mcg by mouth in the morning.     No current facility-administered medications on file prior to visit.    Review of Systems     Objective:  There were no vitals filed for this visit. BP Readings from Last 3 Encounters:  06/12/23 136/74  04/12/23 130/78  03/20/23 136/80   Wt Readings from Last 3 Encounters:  06/12/23 245 lb (111.1 kg)  04/12/23 241 lb 9.6 oz (109.6 kg)  03/20/23 244 lb (110.7 kg)   There is no height or weight on file to calculate BMI.    Physical Exam         Assessment & Plan:    See Problem List for Assessment and Plan of chronic medical problems.

## 2023-07-26 ENCOUNTER — Ambulatory Visit (INDEPENDENT_AMBULATORY_CARE_PROVIDER_SITE_OTHER): Payer: Medicare Other

## 2023-07-26 ENCOUNTER — Ambulatory Visit: Payer: Medicare Other | Admitting: Internal Medicine

## 2023-07-26 ENCOUNTER — Encounter: Payer: Self-pay | Admitting: Internal Medicine

## 2023-07-26 VITALS — BP 122/70 | HR 61 | Temp 98.5°F | Resp 18 | Ht 68.0 in | Wt 245.5 lb

## 2023-07-26 DIAGNOSIS — Z23 Encounter for immunization: Secondary | ICD-10-CM

## 2023-07-26 DIAGNOSIS — M545 Low back pain, unspecified: Secondary | ICD-10-CM | POA: Insufficient documentation

## 2023-07-26 DIAGNOSIS — G8929 Other chronic pain: Secondary | ICD-10-CM

## 2023-07-26 DIAGNOSIS — M25551 Pain in right hip: Secondary | ICD-10-CM | POA: Diagnosis not present

## 2023-07-26 DIAGNOSIS — M79671 Pain in right foot: Secondary | ICD-10-CM

## 2023-07-26 NOTE — Assessment & Plan Note (Signed)
Subacute  Started a few months ago Pain is on the plantar surface of his foot and the dorsal aspect of his foot is swollen Tenderness multiple areas on plantar surface and along the lateral aspect of his foot He is extremely dry feet and there is a cut on the lateral distal plantar surface-no foreign body except for some sock fuzz His wife is moisturizing his feet and she will continue to do so Referral to orthopedics for further evaluation of his foot pain Could be compensating for knee or hip pain X-ray of foot today to rule out stress fracture

## 2023-07-26 NOTE — Assessment & Plan Note (Signed)
Chronic Limits his mobility Has seen orthopedics in the past Uses a walker to ambulate Has done physical therapy in the past His family would like [facility]-referral ordered

## 2023-07-26 NOTE — Assessment & Plan Note (Signed)
Has had increasing right hip pain S/p total hip replacement Will get x-ray today to make sure his hardware is okay Seems like he is compensating walking on the lateral aspect of the foot-could be back related, hip related, knee related or simply foot related To see orthopedic for further evaluation

## 2023-07-26 NOTE — Patient Instructions (Addendum)
      Flu immunization administered today.       Xray of foot ordered.        A referral was ordered [facility]  and someone will call you to schedule an appointment.

## 2023-07-27 ENCOUNTER — Encounter: Payer: Self-pay | Admitting: Internal Medicine

## 2023-08-03 NOTE — Progress Notes (Signed)
Cardiology Office Note    Date:  08/04/2023  ID:  Jeff Weaver, DOB July 18, 1945, MRN [id number] PCP:  Pincus Sanes, MD  Cardiologist:  Armanda Magic, MD  Electrophysiologist:  Regan Lemming, MD   Chief Complaint: f/u orthostasis, HF, PPM  History of Present Illness: .    Jeff Weaver is a 78 y.o. 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   He has a long history of atypical chest pain. Last cath was in 2019 showing minimal nonobstructive CAD, normal LVEF, normal LVEDP. He had 2nd degree AVB type 1 during cath prompting monitor 02/2018 which showed profound bradycardia, AVB with frequently nonconducted P waves, frequent PVCs (7%) and short runs of NSVT so underwent PPM implantation. Repeat echo 11/2020 demonstrated a decline in EF to 35% possibly due to RV pacing. Nuclear stress test 12/2020 was negative for ischemia. He underwent upgrade to CRT-P in 03/2021 with subsequent normalization of LVEF. Though he's done reasonably well from a cardiac standpoint since that time, he has had several complex medical admissions. He's had progressive cognitive decline ultimately felt to have Lewy body dementia and ground level fall (07/2021) 2/2 pre-syncope with right hip fracture requiring replacement, rib fracture, subdural hematoma managed conservatively, aspiration penumonitis, delirium, and UTI. Of note, wife Jeff Weaver has previously requested not to specifically discuss dementia diagnosis with the patient  as he would get upset and worked up about this. Amlodipine and losartan were previously discontinued and metoprolol reduced due to hypotension. He has not been on more aggressive doses of diuretic due to frequent urination and incontinence. I have offered midodrine due to concern for orthostasis. They have preferred to dose on PRN basis. Repeat echo 09/2022 EF 55-60%, mild LVH, G1Dd, mildly enlarged RV with normal RV function, mild LAE, mild MR, moderate AI, borderline dilation of aortic root and ascending aorta. He saw Dr. Elberta Fortis 12/08/22 who noted histograms were quite flat so rate response was adjusted. He was referred to pharmD 10/22 to discuss possible GLP though there was concern that his memory impairment, diarrhea may pose risk. His circadium rhythm is off in that he tends to sleep during the day and stay awake, sometimes eating, at night.  He returns for follow-up today with his family. He notes several somatic complaints including episodic migrating focal pain around his upper abdomen to chest that is relieved with tylenol, tendency to bite the inside of his cheek, difficulty focusing at times when he is trying to read. He also was having issues with frequent urination that is improved with resuming Myrbetriq. He has not had to use midodrine recently. His edema waxes and wanes.   Labwork independently reviewed: 03/2023 LFTs OK, trig 94, LDL 63 01/2023 Hgb 14.1, plt 165, Na 134, Cr 0.99, LFTs wnl , Mg 2.1, K 4.0, Cr 0.93   ROS: .    Please see the history of present illness.  All other systems are reviewed and otherwise negative.  Studies Reviewed: Marland Kitchen    EKG:  EKG is not ordered today,  reviewed 04/2023 AV dual paced rhythm 66bpm   CV Studies: Cardiac studies reviewed are outlined and summarized above. Otherwise please see EMR for full report.   Current Reported Medications:.    Current Meds  Medication Sig   acetaminophen (TYLENOL) 325 MG tablet Take 325-650 mg by mouth every 6 (six)  hours as needed (pain/headaches.).   carbidopa-levodopa (SINEMET IR) 25-100 MG tablet as directed.   cetirizine (ZYRTEC) 10 MG tablet Take 1 tablet (10 mg total) by mouth daily.   Cholecalciferol (VITAMIN D) 50 MCG (2000 UT) CAPS Take 2,000 Units by mouth in the morning.   donepezil (ARICEPT) 10 MG tablet Take half a tablet in the morning for a month and then increase to 1 full tablet daily.   finasteride (PROSCAR) 5 MG tablet Take 5 mg by mouth daily.   ipratropium (ATROVENT) 0.06 % nasal spray Place 2 sprays into the nose as needed for rhinitis.   levETIRAcetam (KEPPRA) 500 MG tablet Take 3 tablets by mouth in the morning and at bedtime.   loperamide (IMODIUM) 2 MG capsule Take 1 capsule (2 mg total) by mouth as needed for diarrhea or loose stools.   LORazepam (ATIVAN) 1 MG tablet Take 1 mg by mouth every 6 (six) hours as needed for seizure.    metoprolol succinate (TOPROL XL) 25 MG 24 hr tablet Take 0.5 tablets (12.5 mg total) by mouth daily.   Midazolam (NAYZILAM) 5 MG/0.1ML SOLN Place 1 each into the nose as needed (seizure).   midodrine (PROAMATINE) 2.5 MG tablet Take 1 tablet by mouth daily only as needed   mirabegron ER (MYRBETRIQ) 50 MG TB24 tablet Take 50 mg by mouth daily.   mupirocin ointment (BACTROBAN) 2 % Apply 1 Application topically daily.   nitroGLYCERIN (NITROSTAT) 0.4 MG SL tablet PLACE 1 TABLET (0.4 MG TOTAL) UNDER THE TONGUE EVERY 5 (FIVE) MINUTES AS NEEDED FOR CHEST PAIN.   nystatin ointment (MYCOSTATIN) Apply 1 Application topically 2 (two) times daily.   Propylene Glycol (SYSTANE BALANCE) 0.6 % SOLN Apply 1 drop to eye daily as needed (dry eyes).   pyridOXINE (VITAMIN B-6) 100 MG tablet Take 100 mg by mouth in the morning.   rosuvastatin (CRESTOR) 5 MG tablet Take 1 tablet (5 mg total) by mouth 3 (three) times a week.   Tamsulosin HCl (FLOMAX) 0.4 MG CAPS Take 0.4 mg by mouth every evening.   thiamine 100 MG tablet Take 100 mg by mouth in the morning.   torsemide  (DEMADEX) 20 MG tablet Take 1 tablet (20 mg) by mouth alternating with 1/2 tablet (10 mg) by mouth every other day.   triamcinolone cream (KENALOG) 0.5 % Apply 1 Application topically as needed (break out prevention).   VIMPAT 200 MG TABS tablet Take 200 mg by mouth 2 (two) times daily.   vitamin B-12 (CYANOCOBALAMIN) 1000 MCG tablet Take 1,000 mcg by mouth in the morning.    Physical Exam:    VS:  BP 126/80   Pulse 63   Ht 5\' 8"  (1.727 m)   Wt 249 lb (112.9 kg)   SpO2 94%   BMI 37.86 kg/m    Wt Readings from Last 3 Encounters:  08/04/23 249 lb (112.9 kg)  08/04/23 249 lb (112.9 kg)  07/26/23 245 lb 8 oz (111.4 kg)    GEN: Well nourished, well developed in no acute distress NECK: No JVD; No carotid bruits CARDIAC: RRR, no murmurs, rubs, gallops RESPIRATORY:  Clear to auscultation without rales, wheezing or rhonchi  ABDOMEN:  Soft, non-tender, non-distended EXTREMITIES:  soft puffy BLE/pedal edema; No acute deformity   Asessement and Plan:.    1. Chronic combined HF, NICM with orthostasis - clinically appears stable. Would not titrate torsemide further at this time given his overall clinical stability and tendency for orthostasis. Has not had to use midodrine recently - family previously preferred to use PRN on days when he is out and about. Will update CBC, BMET today.  2. Minimal CAD with chronic chest pain - stable without progression of anginal symptoms. Similar to previous visits he describes an atypical migrating discomfort that moves around different areas of his abdomen/chest. This symptom was similarly described when he had prior workups above. It does not sound anginal in nature. EKG historically unhelpful given AV pacing. This improves with Tylenol. Question neuropathic. Encourage continued primary care, neuro follow-up.  3. PVCs - quiescent on metoprolol, low dose, continue.  4. Moderate AI, mild MR, mild dilation of aorta - recheck echo after 09/21/23.  5 Obesity -  pharmD had long conversation about Wegovy risks and benefits at previous visit, reiterated in our discussion today. (Per Melissa's note, "We discussed my concerns for possible GI side effects. In one way I do think medication could be beneficial if it decreases his desire to snack in the middle of the night. However, I also fear that his memory impairment may over come this and he could overeat and become sick. I do worry a little about diarrhea and him becoming dehydrated or throwing up an aspirating. All of these concerns were reviewed."). He also has history of overflow diarrhea, thankfully stable recently per family. It sounds like this is worth considering with close clinical follow-up, but per our discussion I will reach out to GI Dr. Rhea Belton to find out if his prior bowel issues would impact his candidacy.     Disposition: F/u with me in 3-4 months. We also discussed that according to [facility] literature at this time agent orange exposure is linked to ischemic heart disease which he does not have therefore would not qualify from cardiac standpoint for this link. Will also be due for EP f/u 12/2023, on recall.  Signed, Laurann Montana, PA-C

## 2023-08-04 ENCOUNTER — Encounter: Payer: Self-pay | Admitting: Physician Assistant

## 2023-08-04 ENCOUNTER — Other Ambulatory Visit: Payer: Self-pay | Admitting: Internal Medicine

## 2023-08-04 ENCOUNTER — Telehealth: Payer: Self-pay

## 2023-08-04 ENCOUNTER — Ambulatory Visit: Payer: Medicare Other | Admitting: Physician Assistant

## 2023-08-04 ENCOUNTER — Other Ambulatory Visit (INDEPENDENT_AMBULATORY_CARE_PROVIDER_SITE_OTHER): Payer: Medicare Other

## 2023-08-04 ENCOUNTER — Ambulatory Visit: Payer: Medicare Other | Attending: Physician Assistant | Admitting: Physician Assistant

## 2023-08-04 ENCOUNTER — Ambulatory Visit (INDEPENDENT_AMBULATORY_CARE_PROVIDER_SITE_OTHER): Payer: Medicare Other

## 2023-08-04 VITALS — BP 124/80 | HR 60 | Ht 68.0 in | Wt 249.0 lb

## 2023-08-04 VITALS — BP 126/80 | HR 63 | Ht 68.0 in | Wt 249.0 lb

## 2023-08-04 DIAGNOSIS — I428 Other cardiomyopathies: Secondary | ICD-10-CM | POA: Insufficient documentation

## 2023-08-04 DIAGNOSIS — I493 Ventricular premature depolarization: Secondary | ICD-10-CM | POA: Diagnosis present

## 2023-08-04 DIAGNOSIS — I340 Nonrheumatic mitral (valve) insufficiency: Secondary | ICD-10-CM | POA: Diagnosis present

## 2023-08-04 DIAGNOSIS — I2510 Atherosclerotic heart disease of native coronary artery without angina pectoris: Secondary | ICD-10-CM | POA: Diagnosis present

## 2023-08-04 DIAGNOSIS — Z0000 Encounter for general adult medical examination without abnormal findings: Secondary | ICD-10-CM | POA: Diagnosis not present

## 2023-08-04 DIAGNOSIS — Z6837 Body mass index (BMI) 37.0-37.9, adult: Secondary | ICD-10-CM | POA: Insufficient documentation

## 2023-08-04 DIAGNOSIS — I77810 Thoracic aortic ectasia: Secondary | ICD-10-CM | POA: Diagnosis present

## 2023-08-04 DIAGNOSIS — I5042 Chronic combined systolic (congestive) and diastolic (congestive) heart failure: Secondary | ICD-10-CM | POA: Diagnosis present

## 2023-08-04 DIAGNOSIS — M810 Age-related osteoporosis without current pathological fracture: Secondary | ICD-10-CM | POA: Diagnosis not present

## 2023-08-04 DIAGNOSIS — I951 Orthostatic hypotension: Secondary | ICD-10-CM | POA: Diagnosis present

## 2023-08-04 DIAGNOSIS — E66812 Obesity, class 2: Secondary | ICD-10-CM | POA: Diagnosis present

## 2023-08-04 DIAGNOSIS — I351 Nonrheumatic aortic (valve) insufficiency: Secondary | ICD-10-CM | POA: Diagnosis present

## 2023-08-04 LAB — BASIC METABOLIC PANEL
BUN: 22 mg/dL (ref 6–23)
CO2: 32 meq/L (ref 19–32)
Calcium: 9.8 mg/dL (ref 8.4–10.5)
Chloride: 97 meq/L (ref 96–112)
Creatinine, Ser: 0.96 mg/dL (ref 0.40–1.50)
GFR: 75.34 mL/min (ref >60.00)
Glucose, Bld: 91 mg/dL (ref 70–99)
Potassium: 4.2 meq/L (ref 3.5–5.1)
Sodium: 135 meq/L (ref 135–145)

## 2023-08-04 LAB — CBC WITH DIFFERENTIAL/PLATELET
Basophils Absolute: 0 10*3/uL (ref 0.0–0.1)
Basophils Relative: 0.4 % (ref 0.0–3.0)
Eosinophils Absolute: 0.2 10*3/uL (ref 0.0–0.7)
Eosinophils Relative: 2.1 % (ref 0.0–5.0)
HCT: 41.6 % (ref 39.0–52.0)
Hemoglobin: 13.8 g/dL (ref 13.0–17.0)
Lymphocytes Relative: 26.6 % (ref 12.0–46.0)
Lymphs Abs: 2.3 10*3/uL (ref 0.7–4.0)
MCHC: 33.2 g/dL (ref 30.0–36.0)
MCV: 94.4 fL (ref 78.0–100.0)
Monocytes Absolute: 0.6 10*3/uL (ref 0.1–1.0)
Monocytes Relative: 7.1 % (ref 3.0–12.0)
Neutro Abs: 5.5 10*3/uL (ref 1.4–7.7)
Neutrophils Relative %: 63.8 % (ref 43.0–77.0)
Platelets: 164 10*3/uL (ref 150.0–400.0)
RBC: 4.41 Mil/uL (ref 4.22–5.81)
RDW: 13.2 % (ref 11.5–15.5)
WBC: 8.7 10*3/uL (ref 4.0–10.5)

## 2023-08-04 NOTE — Telephone Encounter (Signed)
Patient is due for a DEXA.  I wanted to know if you are ok with me placing the order?

## 2023-08-04 NOTE — Patient Instructions (Signed)
Mr. Swart , Thank you for taking time to come for your Medicare Wellness Visit. I appreciate your ongoing commitment to your health goals. Please review the following plan we discussed and let me know if I can assist you in the future.   Referrals/Orders/Follow-Ups/Clinician Recommendations: It was nice to meet you today.   Keep up the good work sir  This is a list of the screening recommended for you and due dates:  Health Maintenance  Topic Date Due   DEXA scan (bone density measurement)  10/22/2018   Medicare Annual Wellness Visit  05/31/2023   COVID-19 Vaccine (6 - 2024-25 season) 10/10/2023*   DTaP/Tdap/Td vaccine (4 - Td or Tdap) 09/19/2032   Pneumonia Vaccine  Completed   Flu Shot  Completed   Hepatitis C Screening  Completed   Zoster (Shingles) Vaccine  Completed   HPV Vaccine  Aged Out   Colon Cancer Screening  Discontinued  *Topic was postponed. The date shown is not the original due date.    Advanced directives: (Copy Requested) Please bring a copy of your health care power of attorney and living will to the office to be added to your chart at your convenience.  Next Medicare Annual Wellness Visit scheduled for next year: Yes

## 2023-08-04 NOTE — Progress Notes (Signed)
Subjective:   Jeff Weaver is a 78 y.o. male who presents for Medicare Annual/Subsequent preventive examination.  Visit Complete: In person   Cardiac Risk Factors include: male gender;advanced age (>76men, >8 women);hypertension;Other (see comment);dyslipidemia, Risk factor comments: CAD, Stroke, OSA, HFrEF (heart failure with reduced ejection fraction) ,Parkinsonism     Objective:    Today's Vitals   08/04/23 0842  BP: 124/80  Pulse: 60  SpO2: 94%  Weight: 249 lb (112.9 kg)  Height: 5\' 8"  (1.727 m)  PainSc: 7    Body mass index is 37.86 kg/m.     08/04/2023    9:04 AM 03/15/2023    3:24 PM 03/15/2023    2:51 PM 02/20/2023    3:24 PM 05/30/2022   11:40 AM 07/14/2021    2:41 PM 03/10/2021    1:22 PM  Advanced Directives  Does Patient Have a Medical Advance Directive? Yes Yes Yes Yes Yes Yes Yes  Type of Estate agent of [place];Living will Healthcare Power of [place];Living will Healthcare Power of [place];Living will Healthcare Power of [place];Living will Healthcare Power of [place];Living will Healthcare Power of [place];Living will Healthcare Power of [place];Living will  Does patient want to make changes to medical advance directive?    No - Patient declined     Copy of Healthcare Power of Attorney in Chart? No - copy requested  No - copy requested No - copy requested No - copy requested No - copy requested     Current Medications (verified) Outpatient Encounter Medications as of 08/04/2023  Medication Sig   acetaminophen (TYLENOL) 325 MG tablet Take 325-650 mg by mouth every 6 (six) hours as needed (pain/headaches.).   carbidopa-levodopa (SINEMET IR) 25-100 MG tablet as directed.   cetirizine (ZYRTEC) 10 MG tablet Take 1 tablet (10 mg total) by mouth daily.   Cholecalciferol (VITAMIN D) 50 MCG (2000 UT) CAPS Take 2,000 Units by mouth in the morning.   donepezil (ARICEPT) 10 MG tablet Take half a tablet in the morning for a month and then  increase to 1 full tablet daily.   finasteride (PROSCAR) 5 MG tablet Take 5 mg by mouth daily.   ipratropium (ATROVENT) 0.06 % nasal spray Place 2 sprays into the nose as needed for rhinitis.   levETIRAcetam (KEPPRA) 500 MG tablet Take 3 tablets by mouth in the morning and at bedtime.   loperamide (IMODIUM) 2 MG capsule Take 1 capsule (2 mg total) by mouth as needed for diarrhea or loose stools.   LORazepam (ATIVAN) 1 MG tablet Take 1 mg by mouth every 6 (six) hours as needed for seizure.    metoprolol succinate (TOPROL XL) 25 MG 24 hr tablet Take 0.5 tablets (12.5 mg total) by mouth daily.   Midazolam (NAYZILAM) 5 MG/0.1ML SOLN Place 1 each into the nose as needed (seizure).   midodrine (PROAMATINE) 2.5 MG tablet Take 1 tablet by mouth daily only as needed   mupirocin ointment (BACTROBAN) 2 % Apply 1 Application topically daily.   nitroGLYCERIN (NITROSTAT) 0.4 MG SL tablet PLACE 1 TABLET (0.4 MG TOTAL) UNDER THE TONGUE EVERY 5 (FIVE) MINUTES AS NEEDED FOR CHEST PAIN.   nystatin ointment (MYCOSTATIN) Apply 1 Application topically 2 (two) times daily.   Propylene Glycol (SYSTANE BALANCE) 0.6 % SOLN Apply 1 drop to eye daily as needed (dry eyes).   pyridOXINE (VITAMIN B-6) 100 MG tablet Take 100 mg by mouth in the morning.   rosuvastatin (CRESTOR) 5 MG tablet Take 1 tablet (5 mg  total) by mouth 3 (three) times a week.   Tamsulosin HCl (FLOMAX) 0.4 MG CAPS Take 0.4 mg by mouth every evening.   thiamine 100 MG tablet Take 100 mg by mouth in the morning.   torsemide (DEMADEX) 20 MG tablet Take 1 tablet (20 mg) by mouth alternating with 1/2 tablet (10 mg) by mouth every other day.   triamcinolone cream (KENALOG) 0.5 % Apply 1 Application topically as needed (break out prevention).   VIMPAT 200 MG TABS tablet Take 200 mg by mouth 2 (two) times daily.   vitamin B-12 (CYANOCOBALAMIN) 1000 MCG tablet Take 1,000 mcg by mouth in the morning.   [DISCONTINUED] mirabegron ER (MYRBETRIQ) 25 MG TB24 tablet Take  by mouth. (Patient not taking: Reported on 08/04/2023)   clonazePAM (KLONOPIN) 1 MG disintegrating tablet Take 1 mg by mouth as directed. ALLOW 1 TABLET TO DISSOLVE IN CHEEK FOR SEIZURES LASTING LONGER THAN 4 MINUTES. OR AFTER 2ND SEIZURE IN 24 HOURS PRN   No facility-administered encounter medications on file as of 08/04/2023.    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 s/p clipping at [facility] in 8/13 c/b short-term memory loss, cerebral hemorrhage and seizure disorder s/p VP shunt.   Cerebral hemorrhage (HCC)    Chronic combined systolic and diastolic CHF (congestive heart failure) (HCC)    CVA (cerebral infarction)    Diverticulosis    Enteritis (regional)    Dr Juanda Chance   First degree AV block    GI bleed    HTN (hypertension)    Hyperlipidemia    Hypertensive heart disease    IBS (irritable bowel syndrome)    Lewy body dementia (HCC)    Mild CAD    Cardiac cath 01/12/18 showed minimal nonobstructive CAD, normal LVEF, normal LVEDP.   Mild dilation of ascending aorta (HCC)    Mitral regurgitation    NSVT (nonsustained ventricular tachycardia) (HCC)    Orthostatic hypotension    Pacemaker    Pre-diabetes    PVC's (premature ventricular contractions)    Regional enteritis of large intestine (HCC) since 1978   Rheumatoid arthritis(714.0)    dxed in [employer] in 1980s   Second degree AV block, Mobitz type I    Seizures (HCC)    last April 26,2021   Sinus bradycardia    Sleep apnea    SSS (sick sinus syndrome) (HCC)    Subdural hematoma (HCC)    Past Surgical History:  Procedure Laterality Date   BIV PACEMAKER INSERTION CRT-P N/A 03/10/2021   Procedure: BIV PACEMAKER INSERTION CRT-P UPGRADE;  Surgeon: Regan Lemming, MD;  Location:  [facility] INVASIVE CV LAB;  Service: Cardiovascular;  Laterality: N/A;   CHOLECYSTECTOMY N/A 01/07/2016   Procedure: LAPAROSCOPIC CHOLECYSTECTOMY ;  Surgeon: Abigail Miyamoto, MD;  Location: [facility] OR;  Service: General;  Laterality: N/A;   cns shunt  02/23/12   COLONOSCOPY W/ POLYPECTOMY  1978   negative 2009, Dr Juanda Chance. Due 2014   CRANIOTOMY  02/02/12   Dr Kelby Aline, [facility]-clipping of aneurysm   CRANIOTOMY  02-02-12   left pterional craniotomy for clipping complex anterior communicating artery aneurysm    HERNIA REPAIR     LEFT HEART CATH AND CORONARY ANGIOGRAPHY N/A 01/12/2018   Procedure: LEFT HEART CATH AND CORONARY ANGIOGRAPHY;  Surgeon: [place],  Demetria Pore, MD;  Location: [facility] CV LAB;  Service: Cardiovascular;  Laterality: N/A;   LEFT HEART CATHETERIZATION WITH CORONARY ANGIOGRAM N/A 11/26/2013   Procedure: LEFT HEART CATHETERIZATION WITH CORONARY ANGIOGRAM;  Surgeon: Marykay Lex, MD;  Location[facility] CATH LAB;  Service: Cardiovascular;  Laterality: N/A;   NOSE SURGERY     PACEMAKER IMPLANT N/A 07/02/2018   Procedure: PACEMAKER IMPLANT;  Surgeon: Regan Lemming, MD;  Location: [facility]VAS[facility] CV LAB;  Service: Cardiovascular;  Laterality: N/A;   SHOULDER SURGERY  1997   TONSILLECTOMY AND ADENOIDECTOMY     VENTRICULOPERITONEAL SHUNT  02-23-12   INSERTION OF RIGHT FRONTAL VENTRICULOPERITONEAL SHUNT WITH A CODMAN HAKIM PROGRAMMABLE VALVE   Family History  Problem Relation Age of Onset   COPD Father    Coronary artery disease Father        MI in 62s   Hepatitis Mother    Diabetes Sister    Dementia Sister 58   Diabetes Brother    Colon cancer Neg Hx    Esophageal cancer Neg Hx    Rectal cancer Neg Hx    Stomach cancer Neg Hx    Colon polyps Neg Hx    Social History   Socioeconomic History   Marital status: Married    Spouse name: Asher Muir   Number of children: 1   Years of education: Not on file   Highest education level: Not on file  Occupational History   Occupation: retired/ Heritage manager   Tobacco Use   Smoking status: Former    Current packs/day: 0.00    Types: Pipe, Cigarettes    Quit date: 07/04/1978    Years since quitting: 45.1   Smokeless tobacco: Former  Building services engineer status: Never Used  Substance and Sexual Activity   Alcohol use: Not Currently    Comment: Very Infrequently    Drug use: No   Sexual activity: Not Currently  Other Topics Concern   Not on file  Social History Narrative   Lives with wife.   Social Drivers of Corporate investment banker Strain: Low Risk  (08/04/2023)   Overall Financial Resource Strain (CARDIA)    Difficulty of Paying Living Expenses: Not hard at all  Food Insecurity: No Food Insecurity (05/30/2022)   Hunger Vital Sign    Worried About Running Out of Food in the Last Year: Never true    Ran Out of Food in the Last Year: Never true  Transportation Needs: No Transportation Needs (08/04/2023)   PRAPARE - Administrator, Civil Service (Medical): No    Lack of Transportation (Non-Medical): No  Physical Activity: Inactive (08/04/2023)   Exercise Vital Sign    Days of Exercise per Week: 0 days    Minutes of Exercise per Session: 0 min  Stress: No Stress Concern Present (08/04/2023)   [employer] of Occupational Health - Occupational Stress Questionnaire    Feeling of Stress : Only a little  Social Connections: Socially Isolated (08/04/2023)   Social Connection and Isolation Panel [NHANES]    Frequency of Communication with Friends and Family: Never    Frequency of Social Gatherings with Friends and Family: Never    Attends Religious Services: Never    Database administrator or Organizations: No    Attends Engineer, structural: Never    Marital Status: Married    Tobacco Counseling Counseling given: Not Answered   Clinical Intake:  Pre-visit preparation completed: Yes  Pain : 0-10  Pain Score: 7  Pain Type: Chronic pain Pain Location: Knee     Nutritional Risks: None Diabetes:  No  How often do you need to have someone help you when you read instructions, pamphlets, or other written materials from your doctor or pharmacy?: 1 - Never  Interpreter Needed?: No  Information entered by :: Jourdan Maldonado, RMA   Activities of Daily Living    08/04/2023    8:42 AM  In your present state of health, do you have any difficulty performing the following activities:  Hearing? 1  Comment wears hearing aides  Vision? 0  Difficulty concentrating or making decisions? 1  Walking or climbing stairs? 1  Dressing or bathing? 1  Comment wife helps him  Doing errands, shopping? 0  Comment wife drives  Quarry manager and eating ? N  Using the Toilet? N  In the past six months, have you accidently leaked urine? Y  Do you have problems with loss of bowel control? N  Comment sometimes  Managing your Medications? N  Managing your Finances? N  Housekeeping or managing your Housekeeping? N    Patient Care Team: Pincus Sanes, MD as PCP - General (Internal Medicine) Regan Lemming, MD as PCP - Electrophysiology (Cardiology) Quintella Reichert, MD as PCP - Cardiology (Cardiology) Conner, Desiree Lucy (Neurology) Doreatha Martin Maudry Mayhew, MD as Referring Physician (Neurology) Cherly Anderson, MD as Physician Assistant (Urology) Tyler Aas, CCC-SLP as Speech Language Pathologist (Speech Pathology) Manning Charity, OD as Referring Physician (Optometry)  Indicate any recent Medical Services you may have received from other than [facility] providers in the past year (date may be approximate).     Assessment:   This is a routine wellness examination for Jose.  Hearing/Vision screen Hearing Screening - Comments:: Wears hearing aides Vision Screening - Comments:: Denies vision issues.    Goals Addressed   None   Depression Screen    08/04/2023    9:10 AM 12/20/2022    2:14 PM 05/30/2022   11:23 AM 05/09/2022    9:40 AM 10/28/2020    3:59 PM 05/28/2019   11:33 AM 05/17/2018    5:01 PM   PHQ 2/9 Scores  PHQ - 2 Score 3 0 0 0 0 2 3  PHQ- 9 Score 7     5 6   Exception Documentation --          Fall Risk    08/04/2023    9:04 AM 12/20/2022    2:14 PM 05/30/2022   11:23 AM 05/09/2022   10:08 AM 05/09/2022    9:40 AM  Fall Risk   Falls in the past year? 1 0 0 0 0  Number falls in past yr: 1 0 0 0 0  Injury with Fall? 0 0 0 0 0  Risk for fall due to :  No Fall Risks No Fall Risks  No Fall Risks  Follow up Falls evaluation completed;Falls prevention discussed Falls evaluation completed Falls prevention discussed Falls evaluation completed Falls evaluation completed    MEDICARE RISK AT HOME: Medicare Risk at Home Any stairs in or around the home?: Yes If so, are there any without handrails?: Yes Home free of loose throw rugs in walkways, pet beds, electrical cords, etc?: Yes Adequate lighting in your home to reduce risk of falls?: Yes Life alert?: No Use of a cane, walker or w/c?: Yes Grab bars in the bathroom?: Yes Shower chair or bench in shower?: Yes Elevated toilet seat or a handicapped  toilet?: Yes  TIMED UP AND GO:  Was the test performed?  Yes  Length of time to ambulate 10 feet: 30 sec Gait slow and steady with assistive device    Cognitive Function:    05/30/2022   12:17 PM 05/17/2018    5:11 PM 04/28/2017   10:13 AM  MMSE - Mini Mental State Exam  Not completed: Unable to complete    Orientation to time  5 4  Orientation to Place  5 4  Registration  3 3  Attention/ Calculation  3 4  Recall  2 1  Language- name 2 objects  2 2  Language- repeat  1 1  Language- follow 3 step command  3 2  Language- read & follow direction  1 1  Write a sentence  1 1  Copy design  1 1  Total score  27 24        08/04/2023    8:44 AM 05/30/2022   11:23 AM  6CIT Screen  What Year? 0 points 0 points  What month? 0 points 0 points  What time? 0 points 0 points  Count back from 20 2 points 0 points  Months in reverse 4 points 0 points  Repeat phrase 2  points 0 points  Total Score 8 points 0 points    Immunizations Immunization History  Administered Date(s) Administered   DTP 01/07/1997   Fluad Quad(high Dose 65+) 03/06/2019, 04/23/2020, 03/21/2022   Fluad Trivalent(High Dose 65+) 07/26/2023   Influenza Split 04/10/2012   Influenza Whole 05/05/2011, 04/10/2012   Influenza, High Dose Seasonal PF 04/04/2013, 03/22/2014, 05/01/2015, 03/16/2016, 04/20/2017, 05/17/2018, 08/06/2021   Influenza-Unspecified 07/13/1999, 05/18/2000, 08/16/2001, 04/29/2002, 05/17/2004, 05/05/2005, 04/20/2006, 04/04/2007, 04/21/2008, 05/05/2009, 04/13/2010, 04/03/2012, 04/04/2013, 07/19/2014, 04/10/2015, 03/21/2022   PFIZER Comirnaty(Gray Top)Covid-19 Tri-Sucrose Vaccine 08/01/2019, 03/24/2021   PFIZER(Purple Top)SARS-COV-2 Vaccination 08/01/2019, 08/26/2019, 05/08/2020, 03/24/2021   Pneumococcal Conjugate-13 08/03/2009, 08/04/2011   Pneumococcal Polysaccharide-23 06/19/2006, 08/04/2011, 07/25/2016   Tdap 07/25/2011, 09/20/2022   Zoster Recombinant(Shingrix) 05/15/2017, 07/21/2017   Zoster, Live 06/06/2010    TDAP status: Up to date  Flu Vaccine status: Up to date  Pneumococcal vaccine status: Up to date  Covid-19 vaccine status: Declined, Education has been provided regarding the importance of this vaccine but patient still declined. Advised may receive this vaccine at local pharmacy or Health Dept.or vaccine clinic. Aware to provide a copy of the vaccination record if obtained from local pharmacy or Health Dept. Verbalized acceptance and understanding.  Qualifies for Shingles Vaccine? Yes   Zostavax completed Yes   Shingrix Completed?: Yes  Screening Tests Health Maintenance  Topic Date Due   DEXA SCAN  10/22/2018   COVID-19 Vaccine (6 - 2024-25 season) 10/10/2023 (Originally 03/05/2023)   Medicare Annual Wellness (AWV)  08/03/2024   DTaP/Tdap/Td (4 - Td or Tdap) 09/19/2032   Pneumonia Vaccine 84+ Years old  Completed   INFLUENZA VACCINE  Completed    Hepatitis C Screening  Completed   Zoster Vaccines- Shingrix  Completed   HPV VACCINES  Aged Out   Colonoscopy  Discontinued    Health Maintenance  Health Maintenance Due  Topic Date Due   DEXA SCAN  10/22/2018    Colorectal cancer screening: No longer required.   Lung Cancer Screening: (Low Dose CT Chest recommended if Age 70-80 years, 20 pack-year currently smoking OR have quit w/in 15years.) does not qualify.   Lung Cancer Screening Referral: N/A  Additional Screening:  Hepatitis C Screening: does qualify; Completed 07/25/2016  Vision Screening: Recommended annual ophthalmology  exams for early detection of glaucoma and other disorders of the eye. Is the patient up to date with their annual eye exam?  Yes  Who is the provider or what is the name of the office in which the patient attends annual eye exams? Dr. Emily Filbert If pt is not established with a provider, would they like to be referred to a provider to establish care? No .   Dental Screening: Recommended annual dental exams for proper oral hygiene   Community Resource Referral / Chronic Care Management: CRR required this visit?  No   CCM required this visit?  No     Plan:     I have personally reviewed and noted the following in the patient's chart:   Medical and social history Use of alcohol, tobacco or illicit drugs  Current medications and supplements including opioid prescriptions. Patient is not currently taking opioid prescriptions. Functional ability and status Nutritional status Physical activity Advanced directives List of other physicians Hospitalizations, surgeries, and ER visits in previous 12 months Vitals Screenings to include cognitive, depression, and falls Referrals and appointments  In addition, I have reviewed and discussed with patient certain preventive protocols, quality metrics, and best practice recommendations. A written personalized care plan for preventive services as well as  general preventive health recommendations were provided to patient.     Marypat Kimmet L Mercy Malena, CMA   08/04/2023   After Visit Summary: (MyChart) Due to this being a telephonic visit, the after visit summary with patients personalized plan was offered to patient via MyChart   Nurse Notes: Patient is up to date on all health maintenance.  He is due for another DEXA.  I did not place the order due to wanting Dr. Lawerance Bach to be informed and to get her okay to do so.  He had no other concerns to address today.

## 2023-08-04 NOTE — Patient Instructions (Signed)
Medication Instructions:  Your physician has requested that you have cardiac CT. Cardiac computed tomography (CT) is a painless test that uses an x-ray machine to take clear, detailed pictures of your heart. For further information please visit [email or web address] Please follow instruction sheet as given.   *If you need a refill on your cardiac medications before your next appointment, please call your pharmacy*   Lab Work: TODAY:  BMET  If you have labs (blood work) drawn today and your tests are completely normal, you will receive your results only by: MyChart Message (if you have MyChart) OR A paper copy in the mail If you have any lab test that is abnormal or we need to change your treatment, we will call you to review the results.   Testing/Procedures: Your physician has requested that you have an echocardiogram AFTER September 21 2023.  Echocardiography is a painless test that uses sound waves to create images of your heart. It provides your doctor with information about the size and shape of your heart and how well your heart's chambers and valves are working. This procedure takes approximately one hour. There are no restrictions for this procedure. Please do NOT wear cologne, perfume, aftershave, or lotions (deodorant is allowed). Please arrive 15 minutes prior to your appointment time.  Please note: We ask at that you not bring children with you during ultrasound (echo/ vascular) testing. Due to room size and safety concerns, children are not allowed in the ultrasound rooms during exams. Our front office staff cannot provide observation of children in our lobby area while testing is being conducted. An adult accompanying a patient to their appointment will only be allowed in the ultrasound room at the discretion of the ultrasound technician under special circumstances. We apologize for any inconvenience.    Follow-Up: At [facility], you and your health needs are our  priority.  As part of our continuing mission to provide you with exceptional heart care, we have created designated Provider Care Teams.  These Care Teams include your primary Cardiologist (physician) and Advanced Practice Providers (APPs -  Physician Assistants and Nurse Practitioners) who all work together to provide you with the care you need, when you need it.  We recommend signing up for the patient portal called "MyChart".  Sign up information is provided on this After Visit Summary.  MyChart is used to connect with patients for Virtual Visits (Telemedicine).  Patients are able to view lab/test results, encounter notes, upcoming appointments, etc.  Non-urgent messages can be sent to your provider as well.   To learn more about what you can do with MyChart, go to [email or web address].    Your next appointment:   3 month(s)  Provider:   Ronie Spies, PA-C         Other Instructions   1st Floor: - Lobby - Registration  - Pharmacy  - Lab - Cafe  2nd Floor: - PV Lab - Diagnostic Testing (echo, CT, nuclear med)  3rd Floor: - Vacant  4th Floor: - TCTS (cardiothoracic surgery) - AFib Clinic - Structural Heart Clinic - Vascular Surgery  - Vascular Ultrasound  5th Floor: - HeartCare Cardiology (general and EP) - Clinical Pharmacy for coumadin, hypertension, lipid, weight-loss medications, and med management appointments    Valet parking services will be available as well.

## 2023-08-25 ENCOUNTER — Ambulatory Visit (INDEPENDENT_AMBULATORY_CARE_PROVIDER_SITE_OTHER): Payer: Medicare Other

## 2023-08-25 DIAGNOSIS — I428 Other cardiomyopathies: Secondary | ICD-10-CM | POA: Diagnosis not present

## 2023-08-25 LAB — CUP PACEART REMOTE DEVICE CHECK
Battery Remaining Longevity: 102 mo
Battery Voltage: 2.99 V
Brady Statistic AP VP Percent: 93.13 %
Brady Statistic AP VS Percent: 0.04 %
Brady Statistic AS VP Percent: 6.8 %
Brady Statistic AS VS Percent: 0.03 %
Brady Statistic RA Percent Paced: 93.15 %
Brady Statistic RV Percent Paced: 99.92 %
Date Time Interrogation Session: 20250220203714
Implantable Lead Connection Status: 753985
Implantable Lead Connection Status: 753985
Implantable Lead Connection Status: 753985
Implantable Lead Implant Date: 20191230
Implantable Lead Implant Date: 20191230
Implantable Lead Implant Date: 20220907
Implantable Lead Location: 753858
Implantable Lead Location: 753859
Implantable Lead Location: 753860
Implantable Lead Model: 4598
Implantable Lead Model: 5076
Implantable Lead Model: 5076
Implantable Pulse Generator Implant Date: 20220907
Lead Channel Impedance Value: 323 Ohm
Lead Channel Impedance Value: 361 Ohm
Lead Channel Impedance Value: 380 Ohm
Lead Channel Impedance Value: 399 Ohm
Lead Channel Impedance Value: 418 Ohm
Lead Channel Impedance Value: 437 Ohm
Lead Channel Impedance Value: 437 Ohm
Lead Channel Impedance Value: 513 Ohm
Lead Channel Impedance Value: 608 Ohm
Lead Channel Impedance Value: 665 Ohm
Lead Channel Impedance Value: 665 Ohm
Lead Channel Impedance Value: 741 Ohm
Lead Channel Impedance Value: 760 Ohm
Lead Channel Impedance Value: 817 Ohm
Lead Channel Pacing Threshold Amplitude: 0.625 V
Lead Channel Pacing Threshold Amplitude: 0.875 V
Lead Channel Pacing Threshold Amplitude: 1.125 V
Lead Channel Pacing Threshold Pulse Width: 0.4 ms
Lead Channel Pacing Threshold Pulse Width: 0.4 ms
Lead Channel Pacing Threshold Pulse Width: 0.4 ms
Lead Channel Sensing Intrinsic Amplitude: 1.625 mV
Lead Channel Sensing Intrinsic Amplitude: 1.625 mV
Lead Channel Sensing Intrinsic Amplitude: 15.75 mV
Lead Channel Sensing Intrinsic Amplitude: 15.75 mV
Lead Channel Setting Pacing Amplitude: 1.5 V
Lead Channel Setting Pacing Amplitude: 1.5 V
Lead Channel Setting Pacing Amplitude: 2.25 V
Lead Channel Setting Pacing Pulse Width: 0.4 ms
Lead Channel Setting Pacing Pulse Width: 0.4 ms
Lead Channel Setting Sensing Sensitivity: 1.2 mV
Zone Setting Status: 755011
Zone Setting Status: 755011

## 2023-08-28 NOTE — Telephone Encounter (Signed)
 Patient identification verified by 2 forms. Shade Flood, RN     Per DPR left detailed voice message with provider response regarding GLP-1 medication.

## 2023-08-29 ENCOUNTER — Encounter: Payer: Self-pay | Admitting: Internal Medicine

## 2023-09-19 ENCOUNTER — Ambulatory Visit: Payer: Self-pay | Admitting: Internal Medicine

## 2023-09-19 NOTE — Telephone Encounter (Signed)
 Copied from CRM [id number]. Topic: Clinical - Red Word Triage >> Sep 19, 2023  3:18 PM [place] E wrote: [facility] that prompted transfer to Nurse Triage: Patient's daughter, Natalia Leatherwood, noticed an open wound about 1 inch by a quarter inch (maybe an infection), on the inside of patient's left arm. Natalia Leatherwood stated she noticed dried blood on patient's shirt from that wound and it is red and inflamed, skin is peeling a bit too.  Chief Complaint: skin foreign Symptoms: inside left arm scabbed over, dried blood Frequency: today Pertinent Negatives: Patient denies fever Disposition: [] ED /[] Urgent Care (no appt availability in office) / [] Appointment(In office/virtual)/ []  [place] [facility]/ [] Home Care/ [] Refused Recommended Disposition /[]  Mobile Bus/ []  Follow-up with PCP Additional Notes: unable to gather full details because family irritated with questions stated they only wanted to make an appointment not answer all these questions.  If they new all the answers to the questions - then they would not need to make an appointment.  Want to make an appointment to find out what is wrong.  Attempted to transfer call the CAL - due to wife and daughter kept requesting to speak directly to office  Answer Assessment - Initial Assessment Questions 1. MECHANISM: "How did it happen?"      unknown 2. LOCATION: "Where is the FB (e.g., splinter) located?"      Left arm 3. OBJECT: "What type of FB was it?"      unknown 4. DEPTH: "How deep do you think the FB goes?"      unknown 5. ONSET: "When did the injury occur?" (Minutes or hours)      unknown 6. PAIN: "Is it painful?" If Yes, ask: "How bad is the pain?"  (Scale 1-10; or mild, moderate, severe)     unknown 7. TETANUS: "When was the last tetanus booster?"     unknown 8. PREGNANCY: "Is there any chance you are pregnant?" "When was your last menstrual period?"     unknown  Protocols used: Skin Foreign Body-A-AH

## 2023-09-19 NOTE — Telephone Encounter (Signed)
 Attempted to transfer call to CAL: call and spoke with Asana (I asked for spelling of her name x2 and it was never provided) and informed that pt's wife and daughter were upset and wanted to speak to someone directly at office in order to make / schedule the appointment.  Asana refused to take the call from the family members: stated that is what the nurse triage was for now and she was busy and not going to take the call - I explained to Asana that the family was getting irritated with me asking questions and that's why I was attempting to transfer: again she stated she will not take the call.

## 2023-09-19 NOTE — Progress Notes (Unsigned)
 Subjective:    Patient ID: Jeff Weaver, male    DOB: August 09, 1945, 78 y.o.   MRN: [id number]      HPI Jeff Weaver is here for No chief complaint on file.   Inflamed spot on left arm      Medications and allergies reviewed with patient and updated if appropriate.  Current Outpatient Medications on File Prior to Visit  Medication Sig Dispense Refill   acetaminophen (TYLENOL) 325 MG tablet Take 325-650 mg by mouth every 6 (six) hours as needed (pain/headaches.).     carbidopa-levodopa (SINEMET IR) 25-100 MG tablet as directed.     cetirizine (ZYRTEC) 10 MG tablet Take 1 tablet (10 mg total) by mouth daily. 30 tablet 11   Cholecalciferol (VITAMIN D) 50 MCG (2000 UT) CAPS Take 2,000 Units by mouth in the morning.     clonazePAM (KLONOPIN) 1 MG disintegrating tablet Take 1 mg by mouth as directed. ALLOW 1 TABLET TO DISSOLVE IN CHEEK FOR SEIZURES LASTING LONGER THAN 4 MINUTES. OR AFTER 2ND SEIZURE IN 24 HOURS PRN     donepezil (ARICEPT) 10 MG tablet Take half a tablet in the morning for a month and then increase to 1 full tablet daily. 30 tablet 5   finasteride (PROSCAR) 5 MG tablet Take 5 mg by mouth daily.     ipratropium (ATROVENT) 0.06 % nasal spray Place 2 sprays into the nose as needed for rhinitis. 15 mL 5   levETIRAcetam (KEPPRA) 500 MG tablet Take 3 tablets by mouth in the morning and at bedtime.     loperamide (IMODIUM) 2 MG capsule Take 1 capsule (2 mg total) by mouth as needed for diarrhea or loose stools. 30 capsule 5   LORazepam (ATIVAN) 1 MG tablet Take 1 mg by mouth every 6 (six) hours as needed for seizure.      metoprolol succinate (TOPROL XL) 25 MG 24 hr tablet Take 0.5 tablets (12.5 mg total) by mouth daily. 45 tablet 3   Midazolam (NAYZILAM) 5 MG/0.1ML SOLN Place 1 each into the nose as needed (seizure).     midodrine (PROAMATINE) 2.5 MG tablet Take 1 tablet by mouth daily only as needed 30 tablet 11   mirabegron ER (MYRBETRIQ) 50 MG TB24 tablet Take 50 mg by mouth daily.      mupirocin ointment (BACTROBAN) 2 % Apply 1 Application topically daily. 30 g 2   nitroGLYCERIN (NITROSTAT) 0.4 MG SL tablet PLACE 1 TABLET (0.4 MG TOTAL) UNDER THE TONGUE EVERY 5 (FIVE) MINUTES AS NEEDED FOR CHEST PAIN. 25 tablet 11   nystatin ointment (MYCOSTATIN) Apply 1 Application topically 2 (two) times daily. 60 g 3   Propylene Glycol (SYSTANE BALANCE) 0.6 % SOLN Apply 1 drop to eye daily as needed (dry eyes).     pyridOXINE (VITAMIN B-6) 100 MG tablet Take 100 mg by mouth in the morning.     rosuvastatin (CRESTOR) 5 MG tablet Take 1 tablet (5 mg total) by mouth 3 (three) times a week. 45 tablet 3   Tamsulosin HCl (FLOMAX) 0.4 MG CAPS Take 0.4 mg by mouth every evening.     thiamine 100 MG tablet Take 100 mg by mouth in the morning.     torsemide (DEMADEX) 20 MG tablet Take 1 tablet (20 mg) by mouth alternating with 1/2 tablet (10 mg) by mouth every other day. 135 tablet 3   triamcinolone cream (KENALOG) 0.5 % Apply 1 Application topically as needed (break out prevention).     VIMPAT 200  MG TABS tablet Take 200 mg by mouth 2 (two) times daily.     vitamin B-12 (CYANOCOBALAMIN) 1000 MCG tablet Take 1,000 mcg by mouth in the morning.     No current facility-administered medications on file prior to visit.    Review of Systems     Objective:  There were no vitals filed for this visit. BP Readings from Last 3 Encounters:  08/04/23 126/80  08/04/23 124/80  07/26/23 122/70   Wt Readings from Last 3 Encounters:  08/04/23 249 lb (112.9 kg)  08/04/23 249 lb (112.9 kg)  07/26/23 245 lb 8 oz (111.4 kg)   There is no height or weight on file to calculate BMI.    Physical Exam         Assessment & Plan:    See Problem List for Assessment and Plan of chronic medical problems.

## 2023-09-20 ENCOUNTER — Ambulatory Visit: Admitting: Internal Medicine

## 2023-09-20 ENCOUNTER — Encounter: Payer: Self-pay | Admitting: Internal Medicine

## 2023-09-20 VITALS — BP 118/70 | HR 62 | Temp 98.3°F | Ht 68.0 in

## 2023-09-20 DIAGNOSIS — B354 Tinea corporis: Secondary | ICD-10-CM

## 2023-09-20 DIAGNOSIS — S41112A Laceration without foreign body of left upper arm, initial encounter: Secondary | ICD-10-CM | POA: Insufficient documentation

## 2023-09-20 MED ORDER — MUPIROCIN 2 % EX OINT: 1.0000 | TOPICAL_OINTMENT | Freq: Every day | CUTANEOUS | 2 refills | Status: AC

## 2023-09-20 MED ORDER — KETOCONAZOLE 2 % EX CREA: 1.0000 | TOPICAL_CREAM | Freq: Every day | CUTANEOUS | 0 refills | Status: AC | PRN

## 2023-09-20 NOTE — Patient Instructions (Signed)
        Medications changes include :   for arm - use antibiotic ointment - mupirocin   For chest and underarm rash - ketoconazole cream daily Can use a zeasorb powder or goldbond powder to help prevent moisture.

## 2023-09-20 NOTE — Assessment & Plan Note (Signed)
 Acute Does not look like an insect bit Likely skin tear from mild trauma There is surrounding erythema but I do not think there is an infection so will hold off on oral antibiotics Apply mupirocin ointment daily Monitor closely - if looking worse they will let me know and I will send in an antibiotic

## 2023-09-20 NOTE — Assessment & Plan Note (Signed)
 Acute Chest and axilla b/l Ketoconazole cream daily until resolved then prn Dry area after shower - he does sweat - can consider powder for prevention or just treating prn

## 2023-09-26 NOTE — Progress Notes (Signed)
 Remote pacemaker transmission.

## 2023-10-04 ENCOUNTER — Ambulatory Visit (HOSPITAL_COMMUNITY): Payer: Medicare Other | Attending: Cardiology

## 2023-10-04 DIAGNOSIS — I951 Orthostatic hypotension: Secondary | ICD-10-CM | POA: Insufficient documentation

## 2023-10-04 DIAGNOSIS — I493 Ventricular premature depolarization: Secondary | ICD-10-CM | POA: Diagnosis present

## 2023-10-04 DIAGNOSIS — I2510 Atherosclerotic heart disease of native coronary artery without angina pectoris: Secondary | ICD-10-CM | POA: Diagnosis not present

## 2023-10-04 DIAGNOSIS — I5042 Chronic combined systolic (congestive) and diastolic (congestive) heart failure: Secondary | ICD-10-CM | POA: Diagnosis not present

## 2023-10-04 DIAGNOSIS — I428 Other cardiomyopathies: Secondary | ICD-10-CM | POA: Insufficient documentation

## 2023-10-04 DIAGNOSIS — I351 Nonrheumatic aortic (valve) insufficiency: Secondary | ICD-10-CM | POA: Diagnosis present

## 2023-10-04 DIAGNOSIS — I77810 Thoracic aortic ectasia: Secondary | ICD-10-CM | POA: Diagnosis present

## 2023-10-04 DIAGNOSIS — I340 Nonrheumatic mitral (valve) insufficiency: Secondary | ICD-10-CM | POA: Diagnosis present

## 2023-10-04 LAB — ECHOCARDIOGRAM COMPLETE
Area-P 1/2: 2.74 cm2
Est EF: 55
P 1/2 time: 436 ms
S' Lateral: 2.7 cm

## 2023-10-06 ENCOUNTER — Telehealth: Payer: Self-pay | Admitting: *Deleted

## 2023-10-06 DIAGNOSIS — I77819 Aortic ectasia, unspecified site: Secondary | ICD-10-CM

## 2023-10-06 DIAGNOSIS — I77810 Thoracic aortic ectasia: Secondary | ICD-10-CM

## 2023-10-06 DIAGNOSIS — I5042 Chronic combined systolic (congestive) and diastolic (congestive) heart failure: Secondary | ICD-10-CM

## 2023-10-06 NOTE — Telephone Encounter (Signed)
-----   Message from Laurann Montana sent at 10/06/2023  6:53 AM EDT ----- Please let the [facility] family know echo overall stable. Heart pump function remains normal. All the mild findings seen (mild thickening of heart, mild stiffness, mildly elevated right heart size, mild mitral leaking, moderate aortic leaking) are all similar to prior trends. Aorta size normal by this study. Put in repeat echo for 1 year.

## 2023-10-11 ENCOUNTER — Emergency Department (HOSPITAL_BASED_OUTPATIENT_CLINIC_OR_DEPARTMENT_OTHER)

## 2023-10-11 ENCOUNTER — Other Ambulatory Visit: Payer: Self-pay

## 2023-10-11 ENCOUNTER — Emergency Department (HOSPITAL_BASED_OUTPATIENT_CLINIC_OR_DEPARTMENT_OTHER)
Admission: EM | Admit: 2023-10-11 | Discharge: 2023-10-11 | Disposition: A | Discharge status: Home / Self Care | Attending: Emergency Medicine | Admitting: Emergency Medicine

## 2023-10-11 DIAGNOSIS — I10 Essential (primary) hypertension: Secondary | ICD-10-CM | POA: Insufficient documentation

## 2023-10-11 DIAGNOSIS — S199XXA Unspecified injury of neck, initial encounter: Secondary | ICD-10-CM | POA: Diagnosis present

## 2023-10-11 DIAGNOSIS — Z8673 Personal history of transient ischemic attack (TIA), and cerebral infarction without residual deficits: Secondary | ICD-10-CM | POA: Insufficient documentation

## 2023-10-11 DIAGNOSIS — Z79899 Other long term (current) drug therapy: Secondary | ICD-10-CM | POA: Insufficient documentation

## 2023-10-11 DIAGNOSIS — K5730 Diverticulosis of large intestine without perforation or abscess without bleeding: Secondary | ICD-10-CM | POA: Diagnosis not present

## 2023-10-11 DIAGNOSIS — Y92009 Unspecified place in unspecified non-institutional (private) residence as the place of occurrence of the external cause: Secondary | ICD-10-CM | POA: Insufficient documentation

## 2023-10-11 DIAGNOSIS — Y9301 Activity, walking, marching and hiking: Secondary | ICD-10-CM | POA: Insufficient documentation

## 2023-10-11 DIAGNOSIS — S0990XA Unspecified injury of head, initial encounter: Secondary | ICD-10-CM | POA: Diagnosis not present

## 2023-10-11 DIAGNOSIS — W01198A Fall on same level from slipping, tripping and stumbling with subsequent striking against other object, initial encounter: Secondary | ICD-10-CM | POA: Diagnosis not present

## 2023-10-11 DIAGNOSIS — I700 Atherosclerosis of aorta: Secondary | ICD-10-CM | POA: Diagnosis not present

## 2023-10-11 DIAGNOSIS — S12101A Unspecified nondisplaced fracture of second cervical vertebra, initial encounter for closed fracture: Secondary | ICD-10-CM | POA: Diagnosis not present

## 2023-10-11 LAB — BASIC METABOLIC PANEL WITH GFR
Anion gap: 8 (ref 5–15)
BUN: 18 mg/dL (ref 8–23)
CO2: 32 mmol/L (ref 22–32)
Calcium: 9.6 mg/dL (ref 8.9–10.3)
Chloride: 97 mmol/L — ABNORMAL LOW (ref 98–111)
Creatinine, Ser: 1.01 mg/dL (ref 0.61–1.24)
GFR, Estimated: >60 mL/min (ref >60)
Glucose, Bld: 117 mg/dL — ABNORMAL HIGH (ref 70–99)
Potassium: 4 mmol/L (ref 3.5–5.1)
Sodium: 137 mmol/L (ref 135–145)

## 2023-10-11 LAB — CBC WITH DIFFERENTIAL/PLATELET
Abs Immature Granulocytes: 0.02 10*3/uL (ref 0.00–0.07)
Basophils Absolute: 0 10*3/uL (ref 0.0–0.1)
Basophils Relative: 0 %
Eosinophils Absolute: 0.1 10*3/uL (ref 0.0–0.5)
Eosinophils Relative: 1 %
HCT: 42.6 % (ref 39.0–52.0)
Hemoglobin: 14.4 g/dL (ref 13.0–17.0)
Immature Granulocytes: 0 %
Lymphocytes Relative: 14 %
Lymphs Abs: 1 10*3/uL (ref 0.7–4.0)
MCH: 31.6 pg (ref 26.0–34.0)
MCHC: 33.8 g/dL (ref 30.0–36.0)
MCV: 93.4 fL (ref 80.0–100.0)
Monocytes Absolute: 0.4 10*3/uL (ref 0.1–1.0)
Monocytes Relative: 6 %
Neutro Abs: 5.9 10*3/uL (ref 1.7–7.7)
Neutrophils Relative %: 79 %
Platelets: 149 10*3/uL — ABNORMAL LOW (ref 150–400)
RBC: 4.56 MIL/uL (ref 4.22–5.81)
RDW: 13.2 % (ref 11.5–15.5)
WBC: 7.5 10*3/uL (ref 4.0–10.5)
nRBC: 0 % (ref 0.0–0.2)

## 2023-10-11 MED ORDER — IOHEXOL 300 MG/ML  SOLN
100.0000 mL | Freq: Once | INTRAMUSCULAR | Status: AC | PRN
  Administered 2023-10-11: 100 mL via INTRAVENOUS

## 2023-10-11 MED ORDER — IOHEXOL 350 MG/ML SOLN
75.0000 mL | Freq: Once | INTRAVENOUS | Status: AC | PRN
  Administered 2023-10-11: 75 mL via INTRAVENOUS

## 2023-10-11 NOTE — ED Triage Notes (Signed)
 Walking with walker to restroom. Tripped over feet and fell forward. Hit left side of head on drywall-large hole in wall- photo provided by family. HX brain surgery-non ruptured aneurysm 2013. No thinners.

## 2023-10-11 NOTE — Discharge Instructions (Signed)
 Jeff Weaver was seen in the emergency department for injuries after a fall There was no bleeding in the brain or damage to the VP shunt seen There was a fracture of the C2 vertebrae over the right lateral mass There was no injury to the blood vessels in this area It is important that he leaves the provided collar in place until cleared by neurosurgery You may take the collar off to shower as long as he does his best not to move his neck Please call the number for Dr. Wynetta Emery to be seen by a neurosurgeon in the office in 1 to 2 weeks You can also follow-up with your previous neurosurgeon if you would like if they can get you into the office sooner Return to the emergency department for severe pain, inability to use the right arm or if there are any other concerns

## 2023-10-11 NOTE — ED Notes (Signed)
 Patient transported to CT

## 2023-10-11 NOTE — ED Provider Notes (Signed)
 [place] [facility]GENCY DEPARTMENT AT [facility] Provider Note   CSN: [id number] Arrival date & time: 10/11/23  1531     History  Chief Complaint  Patient presents with   Joslyn Devon is a 78 y.o. male.  With a history of vertebral compression fracture, cerebral aneurysm, stroke, status post VP shunt and hypertension who presents to the ED after a fall.  Patient has been instructed to ambulate with his walker but was walking with his cane at home today in the hallway when he lost his balance tripped and fell.  He struck the left side of his head on the wall and put a hole in the drywall.  No loss of consciousness.  He was able to get up with some assistance.  He reports neck pain now.  No headache chest pain shortness of breath abdominal pain shortness of breath or pain in extremities.  No anticoagulation.   Fall       Home Medications Prior to Admission medications   Medication Sig Start Date End Date Taking? Authorizing Provider  acetaminophen (TYLENOL) 325 MG tablet Take 325-650 mg by mouth every 6 (six) hours as needed (pain/headaches.).    [provider]  carbidopa-levodopa (SINEMET IR) 25-100 MG tablet as directed. 06/09/22   [provider]  cetirizine (ZYRTEC) 10 MG tablet Take 1 tablet (10 mg total) by mouth daily. 10/26/15   Pincus Sanes, MD  Cholecalciferol (VITAMIN D) 50 MCG (2000 UT) CAPS Take 2,000 Units by mouth in the morning.    [provider]  clonazePAM (KLONOPIN) 1 MG disintegrating tablet Take 1 mg by mouth as directed. ALLOW 1 TABLET TO DISSOLVE IN CHEEK FOR SEIZURES LASTING LONGER THAN 4 MINUTES. OR AFTER 2ND SEIZURE IN 24 HOURS PRN 11/05/19 07/26/23  [provider]  donepezil (ARICEPT) 10 MG tablet Take half a tablet in the morning for a month and then increase to 1 full tablet daily. 05/06/21     finasteride (PROSCAR) 5 MG tablet Take 5 mg by mouth daily. 08/24/22   [provider]  ipratropium  (ATROVENT) 0.06 % nasal spray Place 2 sprays into the nose as needed for rhinitis. 03/03/23   Pincus Sanes, MD  ketoconazole (NIZORAL) 2 % cream Apply 1 Application topically daily as needed (for chest and axilla rash). 09/20/23   Pincus Sanes, MD  levETIRAcetam (KEPPRA) 500 MG tablet Take 3 tablets by mouth in the morning and at bedtime. 01/28/22   [provider]  loperamide (IMODIUM) 2 MG capsule Take 1 capsule (2 mg total) by mouth as needed for diarrhea or loose stools. 03/03/23   Pincus Sanes, MD  LORazepam (ATIVAN) 1 MG tablet Take 1 mg by mouth every 6 (six) hours as needed for seizure.     [provider]  metoprolol succinate (TOPROL XL) 25 MG 24 hr tablet Take 0.5 tablets (12.5 mg total) by mouth daily. 04/26/23   Dunn, Tacey Ruiz, PA-C  Midazolam (NAYZILAM) 5 MG/0.1ML SOLN Place 1 each into the nose as needed (seizure).    [provider]  midodrine (PROAMATINE) 2.5 MG tablet Take 1 tablet by mouth daily only as needed 04/26/23   Dunn, Dayna N, PA-C  mirabegron ER (MYRBETRIQ) 50 MG TB24 tablet Take 50 mg by mouth daily.    [provider]  mupirocin ointment (BACTROBAN) 2 % Apply 1 Application topically daily. For wound on left arm 09/20/23   Pincus Sanes, MD  nitroGLYCERIN (  NITROSTAT) 0.4 MG SL tablet PLACE 1 TABLET (0.4 MG TOTAL) UNDER THE TONGUE EVERY 5 (FIVE) MINUTES AS NEEDED FOR CHEST PAIN. 07/20/22   Quintella Reichert, MD  nystatin ointment (MYCOSTATIN) Apply 1 Application topically 2 (two) times daily. 06/12/23   Pincus Sanes, MD  Propylene Glycol (SYSTANE BALANCE) 0.6 % SOLN Apply 1 drop to eye daily as needed (dry eyes).    [provider]  pyridOXINE (VITAMIN B-6) 100 MG tablet Take 100 mg by mouth in the morning.    [provider]  rosuvastatin (CRESTOR) 5 MG tablet Take 1 tablet (5 mg total) by mouth 3 (three) times a week. 03/17/23   Dunn, Tacey Ruiz, PA-C  Tamsulosin HCl (FLOMAX) 0.4 MG CAPS Take 0.4 mg by mouth every evening.     [provider]  thiamine 100 MG tablet Take 100 mg by mouth in the morning.    [provider]  torsemide (DEMADEX) 20 MG tablet Take 1 tablet (20 mg) by mouth alternating with 1/2 tablet (10 mg) by mouth every other day. 11/01/22   Dunn, Tacey Ruiz, PA-C  triamcinolone cream (KENALOG) 0.5 % Apply 1 Application topically as needed (break out prevention). 10/13/21   [provider]  VIMPAT 200 MG TABS tablet Take 200 mg by mouth 2 (two) times daily. 09/03/19   [provider]  vitamin B-12 (CYANOCOBALAMIN) 1000 MCG tablet Take 1,000 mcg by mouth in the morning.    [provider]      Allergies    Haloperidol, Rosuvastatin, Simvastatin, Lisinopril, Nifedipine, and Pravastatin    Review of Systems   Review of Systems  Physical Exam Updated Vital Signs BP (!) 165/77   Pulse (!) 59   Temp 98 F (36.7 C) (Oral)   Resp 13   SpO2 97%  Physical Exam Vitals and nursing note reviewed.  HENT:     Head:     Comments: 2 superficial abrasions over left frontal and left parietal region No skull deformity  Eyes:     Pupils: Pupils are equal, round, and reactive to light.  Neck:     Comments: C-collar in place exam deferred for imaging Cardiovascular:     Rate and Rhythm: Normal rate and regular rhythm.  Pulmonary:     Effort: Pulmonary effort is normal.     Breath sounds: Normal breath sounds.  Abdominal:     Palpations: Abdomen is soft.     Tenderness: There is no abdominal tenderness.     Comments: Old bruising over lower abdomen  Musculoskeletal:     Comments: Full active range of motion of bilateral upper and lower extremities 5 out of 5 strength throughout Sensation tact light touch throughout Thoracic and lumbar midline tenderness without step-off deformity  Skin:    General: Skin is warm and dry.  Neurological:     Mental Status: He is alert.  Psychiatric:        Mood and Affect: Mood normal.     ED Results / Procedures /  Treatments   Labs (all labs ordered are listed, but only abnormal results are displayed) Labs Reviewed  BASIC METABOLIC PANEL WITH GFR - Abnormal; Notable for the following components:      Result Value   Chloride 97 (*)    Glucose, Bld 117 (*)    All other components within normal limits  CBC WITH DIFFERENTIAL/PLATELET - Abnormal; Notable for the following components:   Platelets 149 (*)    All other components within  normal limits    EKG None  Radiology CT Angio Neck W and/or Wo Contrast Result Date: 10/11/2023 CLINICAL DATA:  Initial evaluation for acute trauma, cervical spine fracture. EXAM: CT ANGIOGRAPHY NECK TECHNIQUE: Multidetector CT imaging of the neck was performed using the standard protocol during bolus administration of intravenous contrast. Multiplanar CT image reconstructions and MIPs were obtained to evaluate the vascular anatomy. Carotid stenosis measurements (when applicable) are obtained utilizing NASCET criteria, using the distal internal carotid diameter as the denominator. RADIATION DOSE REDUCTION: This exam was performed according to the departmental dose-optimization program which includes automated exposure control, adjustment of the mA and/or kV according to patient size and/or use of iterative reconstruction technique. CONTRAST:  75mL OMNIPAQUE IOHEXOL 350 MG/ML SOLN COMPARISON:  CT from earlier the same day. FINDINGS: Aortic arch: Visualized aortic arch within normal limits for caliber with standard branch pattern. Mild aortic atherosclerosis. No significant stenosis about the origin of the great vessels. Right carotid system: The right common and internal carotid arteries are patent without dissection. Mild atheromatous change about the right carotid bulb without hemodynamically significant stenosis. Atheromatous change noted about the right carotid siphon. Left carotid system: Left common and internal carotid arteries are patent without dissection. Mild atheromatous  change about the left carotid bulb without stenosis. Atheromatous change noted about the visualized left carotid siphon. Sequelae of prior surgical clipping of aneurysm in the region of the left anterior communicating artery complex. No visible residual aneurysm at this location. Subtle 2 mm outpouching arising near the left MCA bifurcation suspicious for a small aneurysm (series 6, image 57). Focal mild stenosis involving the distal left M1 segment noted as well. Vertebral arteries: Both vertebral arteries arise from subclavian arteries. Left vertebral artery dominant. Vertebral arteries are patent without stenosis or dissection. Skeleton: Previously identified cervical spine fracture better evaluated on prior CT. Chronic compression deformity of C7 noted. No worrisome osseous lesions. Prior left pterional craniotomy. Other neck: No other acute finding within the neck. Upper chest: No other acute finding. Left-sided pacemaker/AICD in place. IMPRESSION: 1. Negative CTA with no evidence for acute traumatic injury to the major arterial vasculature of the neck. 2. Acute C2 fracture, described on prior CT of the cervical spine. 3. Sequelae of prior surgical clipping of aneurysm in the region of the left anterior communicating artery complex. No visible residual or recurrent aneurysm at this location. 4. 2 mm outpouching arising near the left MCA bifurcation, suspicious for a small aneurysm. 5.  Aortic Atherosclerosis (ICD10-I70.0). Electronically Signed   By: Rise Mu M.D.   On: 10/11/2023 21:04   CT CHEST ABDOMEN PELVIS W CONTRAST Result Date: 10/11/2023 CLINICAL DATA:  Polytrauma, blunt back pain s/p fall, h/o vertebral compression fx, VP shunt in place EXAM: CT CHEST, ABDOMEN, AND PELVIS WITH CONTRAST TECHNIQUE: Multidetector CT imaging of the chest, abdomen and pelvis was performed following the standard protocol during bolus administration of intravenous contrast. RADIATION DOSE REDUCTION: This exam  was performed according to the departmental dose-optimization program which includes automated exposure control, adjustment of the mA and/or kV according to patient size and/or use of iterative reconstruction technique. CONTRAST:  OMNIPAQUE IOHEXOL 300 MG/ML  SOLN COMPARISON:  April 14, 2023, March 03, 2023 FINDINGS: CT CHEST FINDINGS Pulmonary Embolism: While the exam was not optimized for the evaluation of the pulmonary arteries, no central pulmonary embolism visualized. Cardiovascular: Mild cardiomegaly. No pericardial effusion. Left chest pacemaker/AICD with leads terminating in the right atrium, right ventricle, and coronary sinus. Multi-vessel coronary atherosclerosis.No  aortic aneurysm. Mediastinum/Nodes: No mediastinal mass.No mediastinal, hilar, or axillary lymphadenopathy. Lungs/Pleura: The midline trachea and bronchi are patent. Biapical pleuroparenchymal scarring. No focal airspace consolidation, pleural effusion, or pneumothorax. Posterior bilateral upper and lower lobe dependent atelectasis. Scattered calcified granulomas. CT ABDOMEN PELVIS FINDINGS Hepatobiliary: No mass.Cholecystectomy.Mild intrahepatic and moderate extrahepatic biliary ductal dilation, likely due to prior cholecystectomy. No radiopaque stones.The portal veins are patent. 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 Spleen: Normal size. No mass. Adrenals/Urinary Tract: No adrenal masses. A couple of subcentimeter hypodensities are noted in the kidneys, too small to definitively characterize, but likely small cysts. No hydronephrosis or nephrolithiasis. The urinary bladder is distended without focal abnormality. Stomach/Bowel: The stomach is decompressed without focal abnormality. No small bowel wall thickening or inflammation. No small bowel obstruction.Normal appendix.Descending and sigmoid colonic diverticulosis.  No changes of acute diverticulitis. Vascular/Lymphatic: No aortic aneurysm. Diffuse aortoiliac atherosclerosis. No intraabdominal or pelvic lymphadenopathy. Reproductive: Mild prostatomegaly.No free pelvic fluid. Other: No pneumoperitoneum, ascites, or mesenteric inflammation. Ventriculoperitoneal shunt catheter tubing in the right abdomen terminating in the anterior pelvis, without associated fluid collection or discontinuity. Musculoskeletal: No acute fracture or destructive lesion. Diffuse osteopenia. Multilevel degenerative disc disease of the spine. Chronic compression fracture of L1. Bilateral glenohumeral joint osteoarthritis. Multiple chronic bilateral rib fractures. Right hip arthroplasty is anatomically aligned without dislocation IMPRESSION: 1. No acute, traumatic injury within the chest, abdomen, or pelvis. 2. 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 A multiphase abdominal MRI with IV contrast in 1 year should be considered to document stability. 3. Descending and sigmoid colonic diverticulosis. No changes of acute diverticulitis. 4. Mild prostatomegaly. Electronically Signed   By: Wallie Char M.D.   On: 10/11/2023 19:45   CT Head Wo Contrast Result Date: 10/11/2023 CLINICAL DATA:  Ground level fall. History of craniotomy for unruptured aneurysm in 2013. EXAM: CT HEAD WITHOUT CONTRAST CT CERVICAL SPINE WITHOUT CONTRAST TECHNIQUE: Multidetector CT imaging of the head and cervical spine was performed following the standard protocol without intravenous contrast. Multiplanar CT image reconstructions of the cervical spine were also generated. RADIATION DOSE REDUCTION: This exam was performed according to the departmental dose-optimization program which includes automated exposure control, adjustment of the mA and/or kV according to patient size and/or use of iterative reconstruction technique. COMPARISON:  None Available. FINDINGS:  CT HEAD FINDINGS Brain: No acute intracranial hemorrhage. No focal mass lesion. No CT evidence of acute infarction. No midline shift or mass effect. No hydrocephalus. Basilar cisterns are patent. Is encephalomalacia in the LEFT frontal lobe related to prior craniotomy. Ventriculostomy catheter with tip at the level of the suprasellar cistern. There are periventricular and subcortical white matter hypodensities. Generalized cortical atrophy. Vascular: No hyperdense vessel or unexpected calcification. Skull: Normal. Negative for fracture or focal lesion. Sinuses/Orbits: Paranasal sinuses and mastoid air cells are clear. Orbits are clear. Other: None. CT CERVICAL SPINE FINDINGS Alignment: Normal alignment of the cervical vertebral bodies. Skull base and vertebrae: Normal craniocervical junction. There is a nondisplaced fracture through the RIGHT lateral mass of the C2 vertebral body (image 21/series 8). This fracture enters the superior aspect of the RIGHT transverse foramen (sagittal image 30/series 7). Chronic endplate compression fracture at at C7. Soft tissues and spinal canal: No prevertebral soft tissue swelling. No perispinal or epidural hematoma. Disc levels:  Unremarkable Upper chest: Clear Other: None IMPRESSION: HEAD CT: 1. No acute intracranial  findings. 2. Chronic microvascular ischemia and atrophy. 3. LEFT frontal encephalomalacia related to prior craniotomy. 4. Ventriculostomy shunt in place CERVICAL SPINE CT: 1. Nondisplaced fracture through the RIGHT lateral mass of the C2 vertebral body. Fracture enters the superior aspect of the RIGHT transverse foramen. Recommend CTA of the neck to evaluate for vascular injury. 2. Chronic endplate compression fracture at C7. Electronically Signed   By: Genevive Bi M.D.   On: 10/11/2023 18:30   CT Cervical Spine Wo Contrast Result Date: 10/11/2023 CLINICAL DATA:  Ground level fall. History of craniotomy for unruptured aneurysm in 2013. EXAM: CT HEAD WITHOUT  CONTRAST CT CERVICAL SPINE WITHOUT CONTRAST TECHNIQUE: Multidetector CT imaging of the head and cervical spine was performed following the standard protocol without intravenous contrast. Multiplanar CT image reconstructions of the cervical spine were also generated. RADIATION DOSE REDUCTION: This exam was performed according to the departmental dose-optimization program which includes automated exposure control, adjustment of the mA and/or kV according to patient size and/or use of iterative reconstruction technique. COMPARISON:  None Available. FINDINGS: CT HEAD FINDINGS Brain: No acute intracranial hemorrhage. No focal mass lesion. No CT evidence of acute infarction. No midline shift or mass effect. No hydrocephalus. Basilar cisterns are patent. Is encephalomalacia in the LEFT frontal lobe related to prior craniotomy. Ventriculostomy catheter with tip at the level of the suprasellar cistern. There are periventricular and subcortical white matter hypodensities. Generalized cortical atrophy. Vascular: No hyperdense vessel or unexpected calcification. Skull: Normal. Negative for fracture or focal lesion. Sinuses/Orbits: Paranasal sinuses and mastoid air cells are clear. Orbits are clear. Other: None. CT CERVICAL SPINE FINDINGS Alignment: Normal alignment of the cervical vertebral bodies. Skull base and vertebrae: Normal craniocervical junction. There is a nondisplaced fracture through the RIGHT lateral mass of the C2 vertebral body (image 21/series 8). This fracture enters the superior aspect of the RIGHT transverse foramen (sagittal image 30/series 7). Chronic endplate compression fracture at at C7. Soft tissues and spinal canal: No prevertebral soft tissue swelling. No perispinal or epidural hematoma. Disc levels:  Unremarkable Upper chest: Clear Other: None IMPRESSION: HEAD CT: 1. No acute intracranial findings. 2. Chronic microvascular ischemia and atrophy. 3. LEFT frontal encephalomalacia related to prior  craniotomy. 4. Ventriculostomy shunt in place CERVICAL SPINE CT: 1. Nondisplaced fracture through the RIGHT lateral mass of the C2 vertebral body. Fracture enters the superior aspect of the RIGHT transverse foramen. Recommend CTA of the neck to evaluate for vascular injury. 2. Chronic endplate compression fracture at C7. Electronically Signed   By: Genevive Bi M.D.   On: 10/11/2023 18:30    Procedures Procedures    Medications Ordered in ED Medications  iohexol (OMNIPAQUE) 300 MG/ML solution 100 mL (100 mLs Intravenous Contrast Given 10/11/23 1747)  iohexol (OMNIPAQUE) 350 MG/ML injection 75 mL (75 mLs Intravenous Contrast Given 10/11/23 2013)    ED Course/ Medical Decision Making/ A&P Clinical Course as of 10/11/23 2231  Wed Oct 11, 2023  1958 CT imaging shows no intracranial traumatic findings.  Ventriculostomy shunt in place.  CT cervical spine does show nondisplaced fracture through the right lateral mass of C2 which anterior superior aspect of the right transverse foramen.  CTA of the neck is recommended to evaluate for vascular injury and we will obtain this here.  There is a chronic endplate compression fracture at C7 also visualized.  No acute traumatic findings on CT chest abdomen pelvis but there is a pancreatic cyst which will need follow-up in the future I have made the patient  and his family aware of these findings, plan for CTA neck and need for follow-up for the pancreatic cyst [MP]    Clinical Course User Index [MP] Royanne Foots, DO                                 Medical Decision Making 78 year old male with history as above not on anticoagulation presents to the ED after mechanical fall at home.  Larey Seat forward and struck his head on the drywall damaging the drywall.  No loss of consciousness.  Now with neck pain.  Slightly hypertensive here.  No other systemic complaints this time.  Exam notable for midline thoracic and lumbar tenderness.  He does have a history of  compression fractures at L1 endplate.  Will obtain CT head C-spine chest abdomen pelvis to evaluate for traumatic injury.  Will obtain basic laboratory workup including CBC, metabolic panel and EKG to look for any underlying cause of weakness or imbalance such as anemia, electrolyte abnormality or dysrhythmia.  Amount and/or Complexity of Data Reviewed Labs: ordered. Radiology: ordered.  Risk Prescription drug management.           Final Clinical Impression(s) / ED Diagnoses Final diagnoses:  Closed nondisplaced fracture of second cervical vertebra, unspecified fracture morphology, initial encounter [facility])    Rx / DC Orders ED Discharge Orders     None         Royanne Foots, DO 10/11/23 2231

## 2023-10-19 ENCOUNTER — Other Ambulatory Visit: Payer: Self-pay | Admitting: Physician Assistant

## 2023-10-29 NOTE — Progress Notes (Unsigned)
 Cardiology Office Note    Date:  10/30/2023  ID:  Jeff, Weaver [birth year], MRN [id number] PCP:  Jeff Dauphin, MD  Cardiologist:  Jeff Keas, MD  Electrophysiologist:  Jeff Pump, MD   Chief Complaint: f/u HTN, heart failure  History of Present Illness: .    Jeff Weaver is a 78 y.o. 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   He has a long history of atypical chest pain. Last cath was in 2019 showing minimal nonobstructive CAD, normal LVEF, normal LVEDP. He had 2nd degree AVB type 1 during cath prompting monitor 02/2018 which showed profound bradycardia, AVB with frequently nonconducted P waves, frequent PVCs (7%) and short runs of NSVT so underwent PPM implantation. Repeat echo 11/2020 demonstrated a decline in EF to 35% possibly due to RV pacing. Nuclear stress test 12/2020 was negative for ischemia. He underwent upgrade to CRT-P in 03/2021 with subsequent normalization of LVEF. Though he's done reasonably well from a cardiac standpoint since that time, he has had several complex medical admissions. He's had progressive cognitive decline ultimately felt to have Lewy body dementia and ground level fall (07/2021) 2/2 pre-syncope with right hip fracture requiring replacement, rib fracture, subdural hematoma managed conservatively, aspiration penumonitis, delirium, and UTI. Of note, wife Jeff Weaver has previously requested not to specifically discuss dementia  diagnosis with the patient as he would get upset and worked up about this. She is his primary caregiver and daughter Jeff Weaver is also very involved. Amlodipine  and losartan  were previously discontinued and metoprolol  reduced due to hypotension. He has not been on more aggressive doses of diuretic due to frequent urination and incontinence. I have offered midodrine  due to concern for orthostasis. They have preferred to dose on PRN basis. He saw Dr. Lawana Weaver 12/2022 who noted histograms were quite flat so rate response was adjusted. He was referred to pharmD 10/22 to discuss possible GLP - there was concern that his memory impairment, diarrhea may pose risk. His circadium rhythm is off in that he tends to sleep during the day and stay awake, sometimes eating, at night. I discussed with GI who felt that it would be reasonable to trial, but discontinue if any GI side effects occurred. Last echo 10/2023 EF 55%, mild LVH of BSS, G1DD, mildly enlarged RV, mild PASP,  mild MR, moderate AI. Aortic root normal but ascending aorta not commented on, not outlined as enlarged (borderline dilated root/ascending aorta 09/2022). 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 Outpatient f/u recommended.   His family reports he saw neurosurgery who recommended to keep in collar until CT follow-up and re-evaluation in May. They have noticed him shuffling his feet a little more when he walks. He also describes occasionally seeing some miniature horses when he looks through doorways (this is the first time he  has mentioned this to family). He is starting another round of antibiotics for recurrence of UTI, which tends to manifest as slight increase in confusion. He has also had some increased acrocyanosis but with normal  O2 sat 98-100%. He does not describe any chest pain but his family reports he occasionally refers to the vague discomfort he's mentioned in the past. Breathing has been stable. He has not had any syncope. We did modified orthostatics and his BP sitting was 105/69, dropping to 89/54 with standing. He was not symptomatic with this.   Labwork independently reviewed: 10/2023 CBC OK, K 4.0, Cr 1.01 03/2023 LFTs OK, trig 94, LDL 63 01/2023 Hgb 14.1, plt 165, Na 134, Cr 0.99, LFTs wnl , Mg 2.1, K 4.0, Cr 0.93  ROS: .    Please see the history of present illness.  All other systems are reviewed and otherwise negative.  Studies Reviewed: Jeff Weaver    EKG:  EKG is not ordered today but reviewed from recent ER visit 10/11/23 NSR (AV pacing noted), RBB pattern  CV Studies: Cardiac studies reviewed are outlined and summarized above. Otherwise please see EMR for full report.   Current Reported Medications:.    Current Meds  Medication Sig   acetaminophen  (TYLENOL ) 325 MG tablet Take 325-650 mg by mouth every 6 (six) hours as needed (pain/headaches.).   AMOXICILLIN  PO Take by mouth daily.   carbidopa-levodopa (SINEMET IR) 25-100 MG tablet as directed.   cetirizine  (ZYRTEC ) 10 MG tablet Take 1 tablet (10 mg total) by mouth daily.   Cholecalciferol  (VITAMIN D ) 50 MCG (2000 UT) CAPS Take 2,000 Units by mouth in the morning.   donepezil  (ARICEPT ) 10 MG tablet Take half a tablet in the morning for a month and then increase to 1 full tablet daily.   finasteride (PROSCAR) 5 MG tablet Take 5 mg by mouth daily.   ipratropium (ATROVENT ) 0.06 % nasal spray Place 2 sprays into the nose as needed for rhinitis.   ketoconazole  (NIZORAL ) 2 % cream Apply 1 Application topically daily as needed (for chest and axilla rash).   levETIRAcetam  (KEPPRA ) 500 MG tablet Take 3 tablets by mouth in the morning and at bedtime.   loperamide  (IMODIUM ) 2 MG capsule Take 1 capsule (2 mg total) by mouth as needed for diarrhea or loose stools.    LORazepam  (ATIVAN ) 1 MG tablet Take 1 mg by mouth every 6 (six) hours as needed for seizure.    metoprolol  succinate (TOPROL  XL) 25 MG 24 hr tablet Take 0.5 tablets (12.5 mg total) by mouth daily.   Midazolam  (NAYZILAM ) 5 MG/0.1ML SOLN Place 1 each into the nose as needed (seizure).   midodrine  (PROAMATINE ) 2.5 MG tablet Take 1 tablet by mouth daily only as needed   mirabegron  ER (MYRBETRIQ ) 50 MG TB24 tablet Take 50 mg by mouth daily.   mupirocin  ointment (BACTROBAN ) 2 % Apply 1 Application topically daily. For wound on left arm   nitroGLYCERIN  (NITROSTAT ) 0.4 MG SL tablet PLACE 1 TABLET (0.4 MG TOTAL) UNDER THE TONGUE EVERY 5 (FIVE) MINUTES AS NEEDED FOR CHEST PAIN.   nystatin  ointment (MYCOSTATIN ) Apply 1 Application topically 2 (two) times daily.   Propylene Glycol (SYSTANE BALANCE) 0.6 % SOLN Apply 1 drop to eye daily as needed (dry eyes).   pyridOXINE  (VITAMIN B-6) 100 MG tablet Take 100 mg by mouth in the morning.   rosuvastatin  (CRESTOR ) 5 MG tablet Take 1 tablet (5 mg total) by mouth 3 (three) times a week.   Tamsulosin  HCl (  FLOMAX ) 0.4 MG CAPS Take 0.4 mg by mouth every evening.   thiamine  100 MG tablet Take 100 mg by mouth in the morning.   torsemide  (DEMADEX ) 20 MG tablet TAKE 1 TABLET (20 MG) BY MOUTH ALTERNATING WITH 1/2 TABLET (10 MG) BY MOUTH EVERY OTHER DAY.   triamcinolone  cream (KENALOG ) 0.5 % Apply 1 Application topically as needed (break out prevention).   VIMPAT  200 MG TABS tablet Take 200 mg by mouth 2 (two) times daily.   vitamin B-12 (CYANOCOBALAMIN ) 1000 MCG tablet Take 1,000 mcg by mouth in the morning.    Physical Exam:    VS:  BP 108/68 (BP Location: Left Arm, Patient Position: Sitting, Cuff Size: Normal)   Pulse 60   Ht 5\' 9"  (1.753 m)   Wt 249 lb (112.9 kg)   SpO2 98%   BMI 36.77 kg/m    Wt Readings from Last 3 Encounters:  10/30/23 249 lb (112.9 kg)  08/04/23 249 lb (112.9 kg)  08/04/23 249 lb (112.9 kg)    GEN: Well nourished, well developed in no  acute distress NECK: No JVD; No carotid bruits CARDIAC: RRR, no murmurs, rubs, gallops RESPIRATORY:  Clear to auscultation without rales, wheezing or rhonchi  ABDOMEN: Soft, non-tender, non-distended EXTREMITIES:  Mild BLE soft edema; No acute deformity   Asessement and Plan:.    1. Chronic combined HF, NICM with orthostasis - edema appears generally at baseline; still present, likely multifactorial. Would not escalate torsemide  dosing due to orthostasis. Historically they have preferred to dose midodrine  PRN but I think given his recent fall and evidence of BP drop with standing, we need to consider scheduled dosing. He is at risk for orthostasis to worsen given his history of Parkisonism and risk of autonomic neuropathy. - per shared decision making will trial midodrine  2.5mg  BID (circadian rhythm is disrupted so the typical avoidance before bedtime does not necessarily apply here) - they will submit mid-day BP readings after 1 week - continue to follow-up with neurology, PCP - post-visit, discussed with Dr. Lawana Weaver as well given h/o flat HR response - we will also discontinue metoprolol , will submit phone note to nurse to notify family - I will also reach out to pharmD to inquire about GLP1; they are interested in now pursuing  2. Mild CAD with chronic chest pain. Continues to have episodic vague chest pains per family. Historically have not been convinced this represents angina. Poor candidate for any invasive evaluations unless convincing evidence of ischemia. With history of falls, not a candidate for aspirin . - continue rosuvastatin  5mg  three times weekly - reassess lipid plan at next follow-up (due 03/2024)  3. PVCs - quiescent by symptoms. - follow with discontinuation of metoprolol  above  4. Moderate AI, mild MR, mild dilation of aorta - stable by echo 10/04/23, does not explain tendency for complexion changes/acrocyanosis seen, with normal O2 sat today, no overt digital ischemia or  loss of pulses noted, so advised f/u PCP as they have planned. - f/u echo planned 10/2024    Disposition: F/u with me in 6-8 weeks.  Signed, Darlisa Spruiell N Shaleen Talamantez, PA-C

## 2023-10-30 ENCOUNTER — Telehealth: Payer: Self-pay | Admitting: Physician Assistant

## 2023-10-30 ENCOUNTER — Encounter: Payer: Self-pay | Admitting: Internal Medicine

## 2023-10-30 ENCOUNTER — Ambulatory Visit: Payer: Medicare Other | Attending: Physician Assistant | Admitting: Physician Assistant

## 2023-10-30 ENCOUNTER — Encounter: Payer: Self-pay | Admitting: *Deleted

## 2023-10-30 ENCOUNTER — Encounter: Payer: Self-pay | Admitting: Physician Assistant

## 2023-10-30 VITALS — BP 108/68 | HR 60 | Ht 69.0 in | Wt 249.0 lb

## 2023-10-30 DIAGNOSIS — I2510 Atherosclerotic heart disease of native coronary artery without angina pectoris: Secondary | ICD-10-CM | POA: Insufficient documentation

## 2023-10-30 DIAGNOSIS — I5042 Chronic combined systolic (congestive) and diastolic (congestive) heart failure: Secondary | ICD-10-CM | POA: Insufficient documentation

## 2023-10-30 DIAGNOSIS — R079 Chest pain, unspecified: Secondary | ICD-10-CM | POA: Insufficient documentation

## 2023-10-30 DIAGNOSIS — I951 Orthostatic hypotension: Secondary | ICD-10-CM | POA: Diagnosis not present

## 2023-10-30 DIAGNOSIS — I493 Ventricular premature depolarization: Secondary | ICD-10-CM | POA: Insufficient documentation

## 2023-10-30 DIAGNOSIS — G8929 Other chronic pain: Secondary | ICD-10-CM | POA: Insufficient documentation

## 2023-10-30 DIAGNOSIS — I428 Other cardiomyopathies: Secondary | ICD-10-CM | POA: Insufficient documentation

## 2023-10-30 DIAGNOSIS — I351 Nonrheumatic aortic (valve) insufficiency: Secondary | ICD-10-CM | POA: Diagnosis present

## 2023-10-30 DIAGNOSIS — I340 Nonrheumatic mitral (valve) insufficiency: Secondary | ICD-10-CM | POA: Diagnosis present

## 2023-10-30 MED ORDER — MIDODRINE HCL 2.5 MG PO TABS: 2.5000 mg | ORAL_TABLET | Freq: Two times a day (BID) | ORAL | 11 refills | Status: DC

## 2023-10-30 NOTE — Telephone Encounter (Signed)
 Pt's wife, Carolyn Cisco, [place] on file, has returned our call.  She is aware of the recommendations below.

## 2023-10-30 NOTE — Telephone Encounter (Signed)
 Call placed to pt's wife, Jamie.  Voicemail box is full.

## 2023-10-30 NOTE — Patient Instructions (Incomplete)
      Blood work was ordered.       Medications changes include :   None    A referral was ordered and someone will call you to schedule an appointment.     No follow-ups on file.

## 2023-10-30 NOTE — Telephone Encounter (Addendum)
 Please let the Holdeman family know I also discussed his case with Dr. Lawana Pray after our visit given that Dr. Lawana Pray had previously adjusted his rate response settings last year, and HR was relatively flat today. Per our discussion, let's have him discontinue his metoprolol  and f/u with Dr. Lawana Pray in June (due for 1 year follow-up) to reassess and adjust device further if needed. Otherwise continue plan as discussed.

## 2023-10-30 NOTE — Progress Notes (Unsigned)
 Subjective:    Patient ID: Jeff Weaver, male    DOB: 1946/01/08, 78 y.o.   MRN: [id number]      HPI Yia is here for No chief complaint on file.     Ct C/Ab/Pelvis 10/11/23:  no kidney stones seen.  No hydronephrosis.  Bladder was distended w/o focal abnormality.   Medications and allergies reviewed with patient and updated if appropriate.  Current Outpatient Medications on File Prior to Visit  Medication Sig Dispense Refill   acetaminophen  (TYLENOL ) 325 MG tablet Take 325-650 mg by mouth every 6 (six) hours as needed (pain/headaches.).     AMOXICILLIN  PO Take by mouth daily.     carbidopa-levodopa (SINEMET IR) 25-100 MG tablet as directed.     cetirizine  (ZYRTEC ) 10 MG tablet Take 1 tablet (10 mg total) by mouth daily. 30 tablet 11   Cholecalciferol  (VITAMIN D ) 50 MCG (2000 UT) CAPS Take 2,000 Units by mouth in the morning.     clonazePAM (KLONOPIN) 1 MG disintegrating tablet Take 1 mg by mouth as directed. ALLOW 1 TABLET TO DISSOLVE IN CHEEK FOR SEIZURES LASTING LONGER THAN 4 MINUTES. OR AFTER 2ND SEIZURE IN 24 HOURS PRN     donepezil  (ARICEPT ) 10 MG tablet Take half a tablet in the morning for a month and then increase to 1 full tablet daily. 30 tablet 5   finasteride (PROSCAR) 5 MG tablet Take 5 mg by mouth daily.     ipratropium (ATROVENT ) 0.06 % nasal spray Place 2 sprays into the nose as needed for rhinitis. 15 mL 5   ketoconazole  (NIZORAL ) 2 % cream Apply 1 Application topically daily as needed (for chest and axilla rash). 30 g 0   levETIRAcetam  (KEPPRA ) 500 MG tablet Take 3 tablets by mouth in the morning and at bedtime.     loperamide  (IMODIUM ) 2 MG capsule Take 1 capsule (2 mg total) by mouth as needed for diarrhea or loose stools. 30 capsule 5   LORazepam  (ATIVAN ) 1 MG tablet Take 1 mg by mouth every 6 (six) hours as needed for seizure.      Midazolam  (NAYZILAM ) 5 MG/0.1ML SOLN Place 1 each into the nose as needed (seizure).     midodrine  (PROAMATINE ) 2.5 MG tablet  Take 1 tablet (2.5 mg total) by mouth 2 (two) times daily with a meal. 60 tablet 11   mirabegron  ER (MYRBETRIQ ) 50 MG TB24 tablet Take 50 mg by mouth daily.     mupirocin  ointment (BACTROBAN ) 2 % Apply 1 Application topically daily. For wound on left arm 30 g 2   nitroGLYCERIN  (NITROSTAT ) 0.4 MG SL tablet PLACE 1 TABLET (0.4 MG TOTAL) UNDER THE TONGUE EVERY 5 (FIVE) MINUTES AS NEEDED FOR CHEST PAIN. 25 tablet 11   nystatin  ointment (MYCOSTATIN ) Apply 1 Application topically 2 (two) times daily. 60 g 3   Propylene Glycol (SYSTANE BALANCE) 0.6 % SOLN Apply 1 drop to eye daily as needed (dry eyes).     pyridOXINE  (VITAMIN B-6) 100 MG tablet Take 100 mg by mouth in the morning.     rosuvastatin  (CRESTOR ) 5 MG tablet Take 1 tablet (5 mg total) by mouth 3 (three) times a week. 45 tablet 3   Tamsulosin  HCl (FLOMAX ) 0.4 MG CAPS Take 0.4 mg by mouth every evening.     thiamine  100 MG tablet Take 100 mg by mouth in the morning.     torsemide  (DEMADEX ) 20 MG tablet TAKE 1 TABLET (20 MG) BY MOUTH ALTERNATING WITH 1/2 TABLET (  10 MG) BY MOUTH EVERY OTHER DAY. 60 tablet 3   triamcinolone  cream (KENALOG ) 0.5 % Apply 1 Application topically as needed (break out prevention).     VIMPAT  200 MG TABS tablet Take 200 mg by mouth 2 (two) times daily.     vitamin B-12 (CYANOCOBALAMIN ) 1000 MCG tablet Take 1,000 mcg by mouth in the morning.     No current facility-administered medications on file prior to visit.    Review of Systems     Objective:  There were no vitals filed for this visit. BP Readings from Last 3 Encounters:  10/30/23 108/68  10/11/23 (!) 155/75  09/20/23 118/70   Wt Readings from Last 3 Encounters:  10/30/23 249 lb (112.9 kg)  08/04/23 249 lb (112.9 kg)  08/04/23 249 lb (112.9 kg)   There is no height or weight on file to calculate BMI.    Physical Exam         Assessment & Plan:    See Problem List for Assessment and Plan of chronic medical problems.

## 2023-10-30 NOTE — Telephone Encounter (Signed)
 2nd call out to pt.  Left a 2nd message for pt's wife, Carolyn Cisco, to call back.

## 2023-10-30 NOTE — Telephone Encounter (Signed)
 [facility] - please let them also know (in addition to message below) that I discussed GLP1 therapy with our pharmD and she agrees with the issues with the orthostasis and C2 fracture now is not the best time to add that into the mix. We can revisit in follow-up when his acute issues settle down.

## 2023-10-30 NOTE — Patient Instructions (Signed)
 Medication Instructions:  Your physician has recommended you make the following change in your medication:  1.  START Midodrine  2.5 taking 1 twice a day   *If you need a refill on your cardiac medications before your next appointment, please call your pharmacy*  Lab Work: None ordered   If you have labs (blood work) drawn today and your tests are completely normal, you will receive your results only by: MyChart Message (if you have MyChart) OR A paper copy in the mail If you have any lab test that is abnormal or we need to change your treatment, we will call you to review the results.  Testing/Procedures:   Follow-Up: At [facility], you and your health needs are our priority.  As part of our continuing mission to provide you with exceptional heart care, our providers are all part of one team.  This team includes your primary Cardiologist (physician) and Advanced Practice Providers or APPs (Physician Assistants and Nurse Practitioners) who all work together to provide you with the care you need, when you need it.  Your next appointment:   6 week(s)  Provider:   Graciela Lava, PA-C          We recommend signing up for the patient portal called "MyChart".  Sign up information is provided on this After Visit Summary.  MyChart is used to connect with patients for Virtual Visits (Telemedicine).  Patients are able to view lab/test results, encounter notes, upcoming appointments, etc.  Non-urgent messages can be sent to your provider as well.   To learn more about what you can do with MyChart, go to [email or web address].   Other Instructions

## 2023-10-31 ENCOUNTER — Ambulatory Visit (INDEPENDENT_AMBULATORY_CARE_PROVIDER_SITE_OTHER): Admitting: Internal Medicine

## 2023-10-31 ENCOUNTER — Encounter: Payer: Self-pay | Admitting: Internal Medicine

## 2023-10-31 VITALS — BP 124/70 | HR 62 | Temp 98.2°F | Ht 69.0 in

## 2023-10-31 DIAGNOSIS — R410 Disorientation, unspecified: Secondary | ICD-10-CM

## 2023-10-31 DIAGNOSIS — B356 Tinea cruris: Secondary | ICD-10-CM | POA: Diagnosis not present

## 2023-10-31 DIAGNOSIS — N3000 Acute cystitis without hematuria: Secondary | ICD-10-CM

## 2023-10-31 DIAGNOSIS — I10 Essential (primary) hypertension: Secondary | ICD-10-CM

## 2023-10-31 MED ORDER — NYSTATIN 100000 UNIT/GM EX OINT: 1.0000 | TOPICAL_OINTMENT | Freq: Two times a day (BID) | CUTANEOUS | 3 refills | Status: AC

## 2023-10-31 MED ORDER — CLOTRIMAZOLE 1 % EX CREA: 1.0000 | TOPICAL_CREAM | Freq: Two times a day (BID) | CUTANEOUS | 2 refills | Status: AC

## 2023-10-31 MED ORDER — NITROGLYCERIN 0.4 MG SL SUBL: SUBLINGUAL_TABLET | SUBLINGUAL | 11 refills | Status: AC | PRN

## 2023-10-31 MED ORDER — LOPERAMIDE HCL 2 MG PO CAPS: 2.0000 mg | ORAL_CAPSULE | ORAL | 5 refills | Status: AC | PRN

## 2023-10-31 NOTE — Assessment & Plan Note (Signed)
 Chronic Intermittent Discussed that sometimes this could be an infection and unfortunately the only way to rule that out is having him seen I do think that some of his confusion is likely related to his dementia Discussed there is no easy way to differentiate the confusion from dementia and possible infection

## 2023-10-31 NOTE — Assessment & Plan Note (Addendum)
 Acute Had increased confusion and there was concern over possible UTI which she has in the past Urgent care 4/3-UA with possible infection and started on Keflex , culture was negative Urgent care 4/27-UA with possible infection, urine culture still negative so advised stopping Augmentin 

## 2023-10-31 NOTE — Assessment & Plan Note (Signed)
 Chronic Blood pressure currently controlled Taken off metoprolol  Also on midodrine  2.5 mg  daily for orthostasis Goal for cardio is SBP less than 150

## 2023-10-31 NOTE — Assessment & Plan Note (Signed)
 Chronic Intermittent-recent flare Typically wears a diaper and does not always change them as regularly as he should-encouraged him to change when wet She is applying nystatin  and a barrier cream over that which seems to be helping Switch nystatin  to clotrimazole  see if that helps more-twice daily as needed

## 2023-11-03 ENCOUNTER — Telehealth: Payer: Self-pay | Admitting: Physician Assistant

## 2023-11-03 NOTE — Telephone Encounter (Signed)
 Wife calling in BP Readings:  5/1 11:12 pm  138/77 5/1 11:53 pm  194/96 5/1 11:54  pm 195/99 5/1 12:03 am  185/95 5/1 12/07 am  188/100 5/1  01:35 aM  183/96  gave metoprolol  5/2  2:10 am 115/83 (after resting and a nap) 5/2 11:27 am 127/76 (Sitting eating) 5/2 12:12 pm  109/61 after lunch still sitting 5/2 12:13 pm 100/62  still sitting 5/2 12:15 pm  85/56 standing 5/2 12/27 pm  94/58 sitting

## 2023-11-03 NOTE — Telephone Encounter (Signed)
 Called the pt's wife back- ID X [phone]  She reports that Dayna Dunn, NP discontinued the Metoprolol  when the patient was seen on 10/30/23. Also, decreased the Mitodrine to 2.5 mg 2 times a day.   Pt was leaning to the right in his chair and was having problems sitting up and eating, which he usually does not have any problems. (He recently broke his neck- so movement is limited, but he is able to sit up and eat, fine)  She denies that he was having any stroke-like symptoms, but the only thing she could think of was that maybe something was going on with his BP due to no longer taking the Metoprolol . She then started taking his BP frequently. His BP continued to increase, so she gave him a Metoprolol  Succinate 12.5 mg at 0135 (reports that his schedule is kinda backwards- sleeps more during the day and up more at night)   5/1 11:12 pm  138/77 5/1 11:53 pm  194/96 5/1 11:54  pm 195/99 5/1 12:03 am  185/95 5/1 12/07 am  188/100 5/1  01:35 aM  183/96  gave metoprolol  5/2  2:10 am 115/83 (after resting and a nap) 5/2 11:27 am 127/76 (Sitting eating) 5/2 12:12 pm  109/61 after lunch still sitting 5/2 12:13 pm 100/62  still sitting 5/2 12:15 pm  85/56 standing 5/2 12/27 pm  94/58 sitting  Advised her that she needs to give him the Mitodrine 2.5 mg, now- due to low BP's.  Instructed her not to give Metoprolol - it was d/c'd and I will ask the provider here about some guidance about how often to check BP's and what times to take Mitodrine.

## 2023-11-03 NOTE — Telephone Encounter (Signed)
 Spoke with DOD- Dr Filiberto Hug- he reviewed all the information and suggested that the patient take Mitodrine if Systolic BP less than 100 and suggested only checking BP Twice a day.   Left message with the information above and instructed to take the patient to the ER if he exhibits any Stroke Symptoms. If you experience any Stoke Symptoms, please go directly to the Emergency Room Stroke symptoms include:  B-Balance problems, E- eyes- blurred vision or suddenly unable to see, F- Facial drooping, A- Arm/s and/or leg/s numbness or tingling, S- Slurred Speech.  Left call back number as well.

## 2023-11-04 IMAGING — DX DG CHEST 2V
2 series · 2 of 2 positions shown · non-contrast
Comparison: 09/30/2021

CLINICAL DATA: 75-year-old male with cough

EXAM:
CHEST - 2 VIEW

[chest pa]
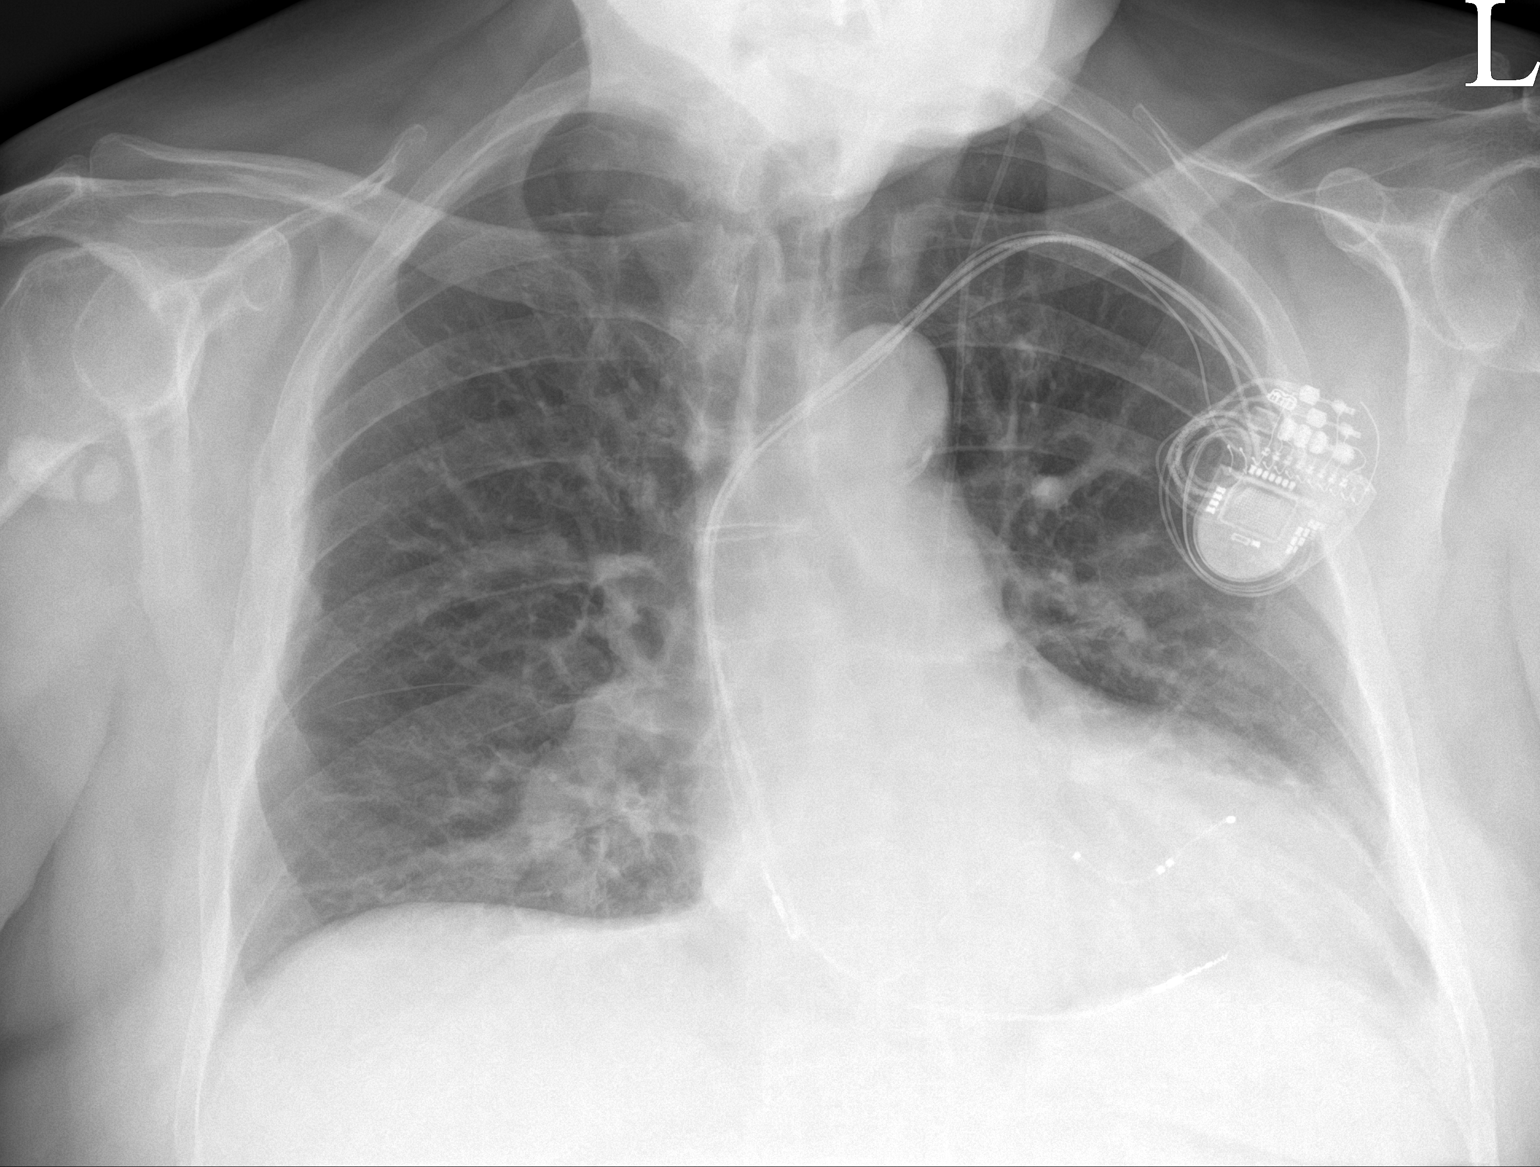

[chest lat]
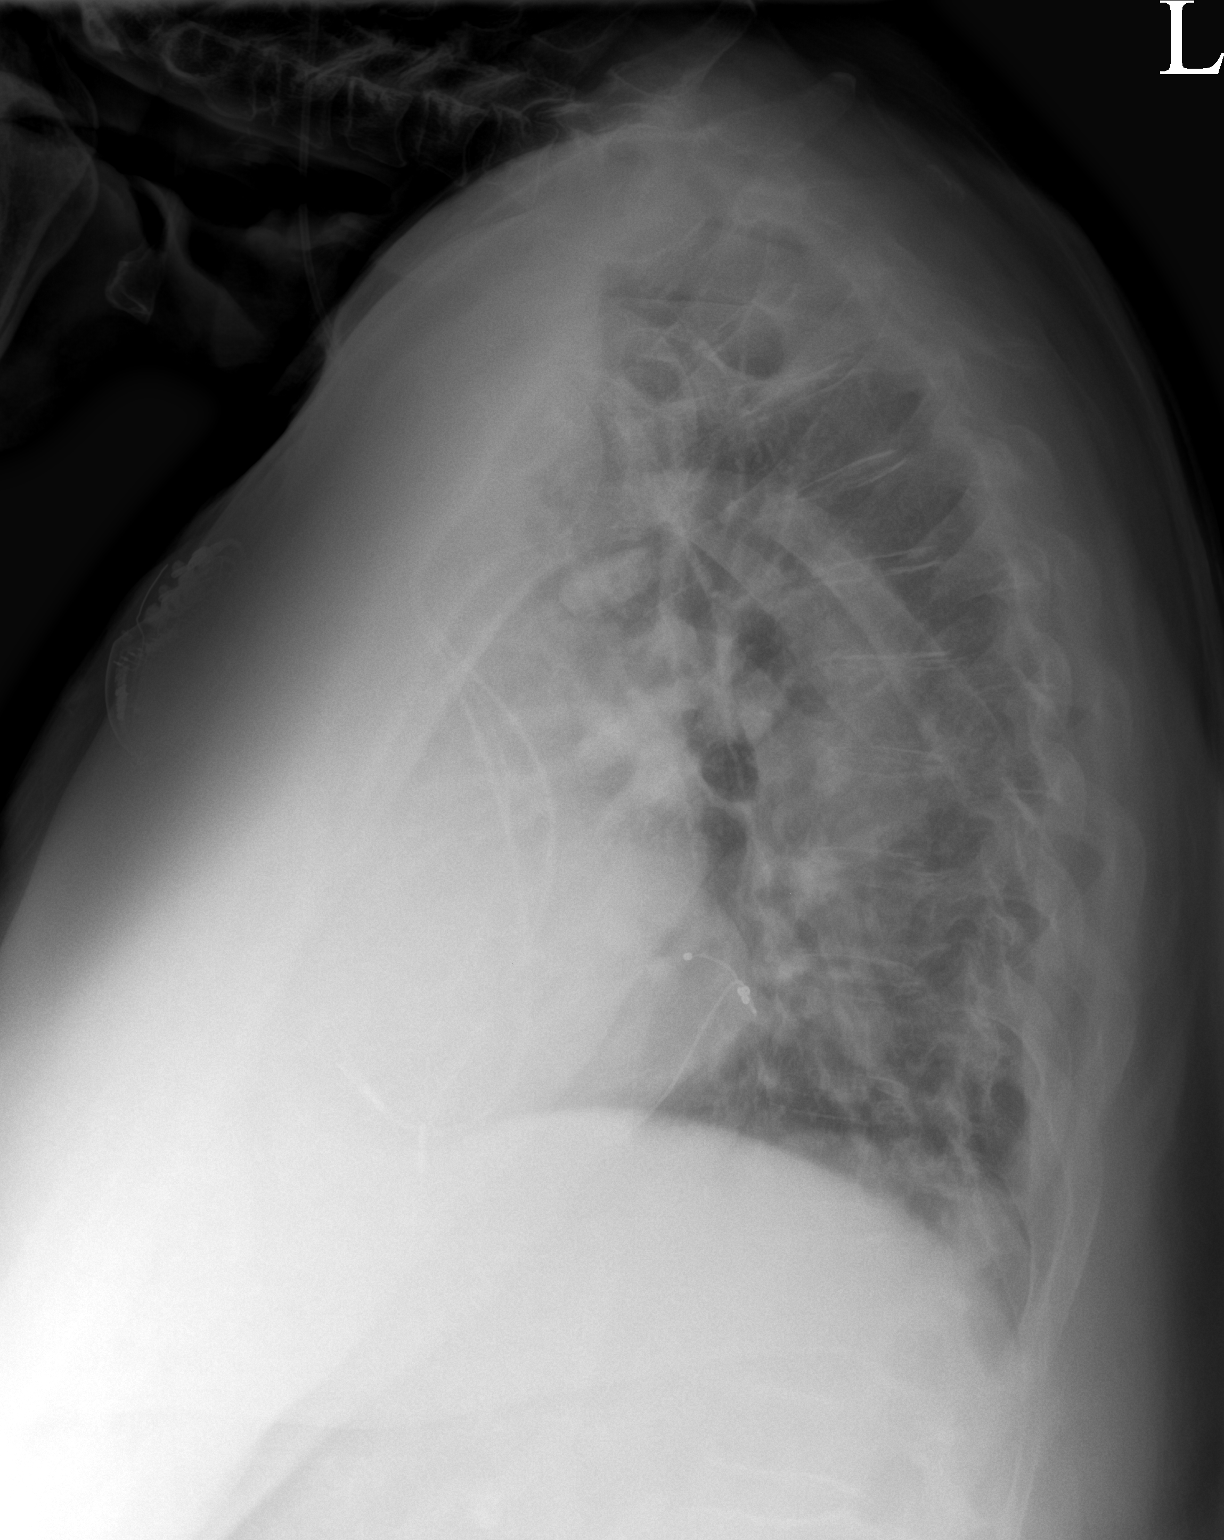

[2 of 2 positions shown; findings below may reference images not displayed]

FINDINGS: Cardiomediastinal silhouette unchanged in size and contour. No
evidence of central vascular congestion. No interlobular septal
thickening.

Unchanged left chest wall pacing device.

Similar course of surgical tubing projecting over the left
hemithorax.

Low lung volumes persist.

No pneumothorax or pleural effusion. Coarsened interstitial
markings, with no confluent airspace disease.

Subacute fractures of the right ribs.
IMPRESSION: Low lung volumes without evidence of acute cardiopulmonary disease.

Subacute fractures of the right ribs.

## 2023-11-06 MED ORDER — MIDODRINE HCL 2.5 MG PO TABS: 2.5000 mg | ORAL_TABLET | ORAL | 3 refills | Status: AC

## 2023-11-06 NOTE — Telephone Encounter (Signed)
 I appreciate the interim recommendation below.  Not clear what was happening around midnight, difficult to know if possibly related due to agitation? This is very out of the ordinary for him.  In the meantime can drop midodrine  to once a day in the AM only (keeping it 2.5mg  daily). Please note, we did not reduce midodrine  at recent OV; we had started it - he was not taking this prior to the visit.  Relay BP readings after 1 week of this, only checking twice a day per Dr. Silverio Drought recommendations. Proceed to ER if continued issues with sitting/eating/balance as instructed. Otherwise recommend ongoing early neurology follow-up as well.

## 2023-11-06 NOTE — Telephone Encounter (Addendum)
 Certainly. Would go ahead and see if we can schedule close follow-up with one of our cardiologists that has an opening. We would just need the OK from Dr. Micael Adas and whoever the new visit is scheduled with to ensure OK per office protocol - I am almost certain it shouldn't be an issue, since Traci primarily used to see for sleep (did see for gen cards in 11/2020) and they wanted to f/u with me after Dr. Cleopatra Dada departure. Thank you!

## 2023-11-06 NOTE — Telephone Encounter (Signed)
 Spoke with Jamie, pt's spouse, [facility] on file.  She has been made aware of keeping the Midodrine  to 2.5 once daily, in the mornings.  She will monitor his BP 2 X's a day and report readings in 1 week.  She is concerned and would like for pt to be referred to another Cardiologist.  He will continue to see Dr. Micael Adas for sleep, but wants another Cardiologist on file.    Will defer to Dayna Dunn, PA.

## 2023-11-24 ENCOUNTER — Ambulatory Visit (INDEPENDENT_AMBULATORY_CARE_PROVIDER_SITE_OTHER): Payer: Medicare Other

## 2023-11-24 DIAGNOSIS — I428 Other cardiomyopathies: Secondary | ICD-10-CM | POA: Diagnosis not present

## 2023-11-24 LAB — CUP PACEART REMOTE DEVICE CHECK
Battery Remaining Longevity: 98 mo
Battery Voltage: 2.98 V
Brady Statistic AP VP Percent: 81.74 %
Brady Statistic AP VS Percent: 0.06 %
Brady Statistic AS VP Percent: 18.17 %
Brady Statistic AS VS Percent: 0.03 %
Brady Statistic RA Percent Paced: 81.77 %
Brady Statistic RV Percent Paced: 99.91 %
Date Time Interrogation Session: 20250523035406
Implantable Lead Connection Status: 753985
Implantable Lead Connection Status: 753985
Implantable Lead Connection Status: 753985
Implantable Lead Implant Date: 20191230
Implantable Lead Implant Date: 20191230
Implantable Lead Implant Date: 20220907
Implantable Lead Location: 753858
Implantable Lead Location: 753859
Implantable Lead Location: 753860
Implantable Lead Model: 4598
Implantable Lead Model: 5076
Implantable Lead Model: 5076
Implantable Pulse Generator Implant Date: 20220907
Lead Channel Impedance Value: 304 Ohm
Lead Channel Impedance Value: 342 Ohm
Lead Channel Impedance Value: 380 Ohm
Lead Channel Impedance Value: 399 Ohm
Lead Channel Impedance Value: 437 Ohm
Lead Channel Impedance Value: 437 Ohm
Lead Channel Impedance Value: 437 Ohm
Lead Channel Impedance Value: 513 Ohm
Lead Channel Impedance Value: 627 Ohm
Lead Channel Impedance Value: 646 Ohm
Lead Channel Impedance Value: 665 Ohm
Lead Channel Impedance Value: 722 Ohm
Lead Channel Impedance Value: 760 Ohm
Lead Channel Impedance Value: 798 Ohm
Lead Channel Pacing Threshold Amplitude: 0.75 V
Lead Channel Pacing Threshold Amplitude: 0.875 V
Lead Channel Pacing Threshold Amplitude: 1.125 V
Lead Channel Pacing Threshold Pulse Width: 0.4 ms
Lead Channel Pacing Threshold Pulse Width: 0.4 ms
Lead Channel Pacing Threshold Pulse Width: 0.4 ms
Lead Channel Sensing Intrinsic Amplitude: 1.625 mV
Lead Channel Sensing Intrinsic Amplitude: 1.625 mV
Lead Channel Sensing Intrinsic Amplitude: 15.75 mV
Lead Channel Sensing Intrinsic Amplitude: 15.75 mV
Lead Channel Setting Pacing Amplitude: 1.5 V
Lead Channel Setting Pacing Amplitude: 1.5 V
Lead Channel Setting Pacing Amplitude: 2.25 V
Lead Channel Setting Pacing Pulse Width: 0.4 ms
Lead Channel Setting Pacing Pulse Width: 0.4 ms
Lead Channel Setting Sensing Sensitivity: 1.2 mV
Zone Setting Status: 755011
Zone Setting Status: 755011

## 2023-11-29 ENCOUNTER — Ambulatory Visit: Payer: Self-pay | Admitting: Cardiology

## 2023-12-12 ENCOUNTER — Ambulatory Visit: Attending: Cardiology | Admitting: Cardiology

## 2023-12-12 ENCOUNTER — Encounter: Payer: Self-pay | Admitting: Cardiology

## 2023-12-12 VITALS — BP 111/74 | HR 60 | Ht 69.0 in | Wt 249.0 lb

## 2023-12-12 DIAGNOSIS — G4733 Obstructive sleep apnea (adult) (pediatric): Secondary | ICD-10-CM

## 2023-12-12 DIAGNOSIS — I493 Ventricular premature depolarization: Secondary | ICD-10-CM

## 2023-12-12 DIAGNOSIS — I10 Essential (primary) hypertension: Secondary | ICD-10-CM | POA: Insufficient documentation

## 2023-12-12 DIAGNOSIS — R001 Bradycardia, unspecified: Secondary | ICD-10-CM | POA: Insufficient documentation

## 2023-12-12 DIAGNOSIS — I5022 Chronic systolic (congestive) heart failure: Secondary | ICD-10-CM | POA: Insufficient documentation

## 2023-12-12 NOTE — Progress Notes (Signed)
  Electrophysiology Office Note:   Date:  12/12/2023  ID:  Jeff Weaver, DOB 22-Sep-1945, MRN [id number]  Primary Cardiologist: Gaylyn Keas, MD Primary Heart Failure: None Electrophysiologist: Imanie Darrow Cort[place] Ding, MD      History of Present Illness:   Jeff Weaver is a 78 y.o. male with h/o obesity, orthostasis, hypertension, prediabetes, cerebral aneurysm post clipping, seizure disorder post VP shunt, sleep apnea, aortic insufficiency, heart failure, junctional bradycardia seen today for routine electrophysiology followup.   Since last being seen in our clinic the patient reports doing about his baseline.  He has no chest pain, but does have some significant fatigue.  His pacemaker is working appropriately.  His pacing is ventricle 99%.  He also has Parkinson's and his family thinks that his fatigue could be related to that..  he denies chest pain, palpitations, dyspnea, PND, orthopnea, nausea, vomiting, dizziness, syncope, edema, weight gain, or early satiety.   Review of systems complete and found to be negative unless listed in HPI.      EP Information / Studies Reviewed:    EKG is not ordered today. EKG from 10/11/2018 Fila reviewed which showed AV paced      PPM Interrogation-  reviewed in detail today,  See PACEART report.  Device History: Medtronic BiV PPM implanted 03/10/2021 for junctional bradycardia and heart failure  Risk Assessment/Calculations:           Physical Exam:   VS:  There were no vitals taken for this visit.   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  Hypertension: Well-controlled  3.  Obstructive sleep apnea: CPAP compliance encouraged  4.  PVCs: Minimal noted  Disposition:   Follow up with EP APP in 12 months  Signed, Jerauld Bostwick Cort[place] Ding, MD

## 2023-12-12 NOTE — Patient Instructions (Signed)
 Medication Instructions:  Your physician recommends that you continue on your current medications as directed. Please refer to the Current Medication list given to you today.  *If you need a refill on your cardiac medications before your next appointment, please call your pharmacy*  Lab Work: None ordered  Testing/Procedures: None ordered  Follow-Up: At [facility], you and your health needs are our priority.  As part of our continuing mission to provide you with exceptional heart care, our providers are all part of one team.  This team includes your primary Cardiologist (physician) and Advanced Practice Providers or APPs (Physician Assistants and Nurse Practitioners) who all work together to provide you with the care you need, when you need it.  Remote monitoring is used to monitor your Pacemaker or ICD from home. This monitoring reduces the number of office visits required to check your device to one time per year. It allows us  to keep an eye on the functioning of your device to ensure it is working properly. You are scheduled for a device check from home on 02/23/2024. You may send your transmission at any time that day. If you have a wireless device, the transmission will be sent automatically. After your physician reviews your transmission, you will receive a postcard with your next transmission date.   Your next appointment:   1 year(s)  Provider:   You will see one of the following Advanced Practice Providers on your designated Care Team:   Mertha Abrahams, [place] "Jonelle Neri" [place], [place] Creighton Doffing, NP   Thank you for choosing [facility]!!   Reece Cane, RN [phone]

## 2023-12-13 NOTE — Progress Notes (Signed)
 Cardiology Office Note    Date:  12/14/2023  ID:  Jeff Weaver, DOB 1945/09/18, MRN [id number] PCP:  Colene Dauphin, MD  Cardiologist APP: Graciela Lava PA-C Electrophysiologist:  Lei Pump, MD   Chief Complaint: f/u orthostasis  History of Present Illness: .    Jeff Weaver is a 78 y.o. 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 in 8/13 c/b memory loss, cerebral hemorrhage and seizure disorder s/p VP shunt, sleep apnea, pnacreatic cyst, obesity, SDH due to fall 07/2021, and Lewy body dementia who is seen for follow-up.   He has a long history of atypical chest pain. Last cath 2019 showed minimal CAD, normal LVEF/LVEDP. He had 2nd degree AVB type 1 during cath prompting monitor 02/2018 which showed profound bradycardia, AVB with frequently nonconducted P waves, frequent PVCs (7%) and short runs of NSVT so underwent PPM implantation. Repeat echo 22022 demonstrated a decline in EF to 35% possibly due to RV pacing. Nuclear stress test at that time was negative for ischemia. He underwent upgrade to CRT-P in 03/2021 with subsequent normalization of LVEF. Though he's done reasonably well from a cardiac standpoint since that time, he has had several complex medical admissions. He's had progressive cognitive decline ultimately felt to have Lewy body dementia/Parkinsonism. In 2023 he had a fall with right hip fracture requiring replacement, rib fracture, subdural hematoma managed conservatively, aspiration penumonitis, delirium, and UTI. Of note, wife Carolyn Cisco has previously requested not to specifically discuss dementia diagnosis with the patient as he would get upset  and worked up about this. She is his primary caregiver and daughter Dana Duncan is also very involved. Medications were previously reduced due to orthostasis. He has not been on more aggressive doses of diuretic due to frequent urination and incontinence. He saw Dr. Lawana Pray 12/2022 who noted histograms were quite flat so rate response was adjusted.He was previously referred to pharmD to consider possible GLP1; this was ultimately deferred due to several concerns (question whether memory impairment, diarrhea may pose risk. His circadium rhythm is off as he tends to sleep during the day and stay awake, sometimes eating, at night. We ultimately tabled this when he began having worsening issues with orthostasis.) Last echo 10/2023 EF 55%, mild LVH of BSS, G1DD, mildly enlarged RV, mild PASP,  mild MR, moderate AI. Aortic root normal but ascending aorta not commented on, not outlined as enlarged (borderline dilated root/ascending aorta 09/2022). 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 He saw neurosurgery who recommended C collar and follow-up. At last OV he had begun having more shuffling and confusion. He was starting another round of antibiotics for recurrence of UTI by outside office. We did modified orthostatics and his BP sitting was 105/69, dropping to 89/54 with standing. We trialed scheduled midodrine  but he had hypertension at other points in the day so this was changed to once daily. We also stopped metoprolol  due to flat HR response per discussion with Dr. Lawana Pray.   He returns for follow-up with Jamie  and Dana Duncan. His blood pressure log overall looks stable and he has not really had any new interim complaints of presyncope or syncope. He shares similar complaints as in prior visits,  with frustration over his memory, occasional indigestion, general loss of strength, bathroom habits. Fortunately he appears stable from a cardiac standpoint. His family shares that their neurologist is leaving to go to [place]  so they are having to re-establish with someone else. He remains in a C collar at the direction of NS.  10/2023 CBC OK, K 4.0, Cr 1.01 03/2023 LFTs OK, trig 94, LDL 63 01/2023 Hgb 14.1, plt 165, Na 134, Cr 0.99, LFTs wnl , Mg 2.1, K 4.0, Cr 0.93  ROS: .    Please see the history of present illness. All other systems are reviewed and otherwise negative.  Studies Reviewed: Aaron Aas    EKG:  EKG not ordered today, reviewed last visit.  CV Studies: Cardiac studies reviewed are outlined and summarized above. Otherwise please see EMR for full report.   Current Reported Medications:.    Current Meds  Medication Sig   acetaminophen  (TYLENOL ) 325 MG tablet Take 325-650 mg by mouth every 6 (six) hours as needed (pain/headaches.).   AMOXICILLIN  PO Take by mouth daily.   carbidopa-levodopa (SINEMET IR) 25-100 MG tablet as directed.   cetirizine  (ZYRTEC ) 10 MG tablet Take 1 tablet (10 mg total) by mouth daily.   Cholecalciferol  (VITAMIN D ) 50 MCG (2000 UT) CAPS Take 2,000 Units by mouth in the morning.   clonazePAM (KLONOPIN) 1 MG disintegrating tablet Take 1 mg by mouth as directed. ALLOW 1 TABLET TO DISSOLVE IN CHEEK FOR SEIZURES LASTING LONGER THAN 4 MINUTES. OR AFTER 2ND SEIZURE IN 24 HOURS PRN   clotrimazole  (LOTRIMIN ) 1 % cream Apply 1 Application topically 2 (two) times daily.   donepezil  (ARICEPT ) 10 MG tablet Take half a tablet in the morning for a month and then increase to 1 full tablet daily.   finasteride (PROSCAR) 5 MG tablet Take 5 mg by mouth daily.   ipratropium (ATROVENT ) 0.06 % nasal spray Place 2 sprays into the nose as needed for rhinitis.   ketoconazole  (NIZORAL ) 2 % cream Apply 1 Application topically daily as needed (for chest and axilla rash).    levETIRAcetam  (KEPPRA ) 500 MG tablet Take 3 tablets by mouth in the morning and at bedtime.   loperamide  (IMODIUM ) 2 MG capsule Take 1 capsule (2 mg total) by mouth as needed for diarrhea or loose stools.   LORazepam  (ATIVAN ) 1 MG tablet Take 1 mg by mouth every 6 (six) hours as needed for seizure.    Midazolam  (NAYZILAM ) 5 MG/0.1ML SOLN Place 1 each into the nose as needed (seizure).   midodrine  (PROAMATINE ) 2.5 MG tablet Take 1 tablet (2.5 mg total) by mouth every morning.   mirabegron  ER (MYRBETRIQ ) 50 MG TB24 tablet Take 50 mg by mouth daily.   mupirocin  ointment (BACTROBAN ) 2 % Apply 1 Application topically daily. For wound on left arm   nitroGLYCERIN  (NITROSTAT ) 0.4 MG SL tablet PLACE 1 TABLET (0.4 MG TOTAL) UNDER THE TONGUE EVERY 5 (FIVE) MINUTES AS NEEDED FOR CHEST PAIN.   nystatin  ointment (MYCOSTATIN ) Apply 1 Application topically 2 (two) times daily.   Propylene Glycol (SYSTANE BALANCE) 0.6 % SOLN Apply 1 drop to eye daily as needed (dry eyes).   pyridOXINE  (VITAMIN B-6) 100 MG tablet Take 100 mg by mouth in the morning.   rosuvastatin  (CRESTOR ) 5 MG tablet Take 1 tablet (5 mg total) by mouth  3 (three) times a week.   Tamsulosin  HCl (FLOMAX ) 0.4 MG CAPS Take 0.4 mg by mouth every evening.   thiamine  100 MG tablet Take 100 mg by mouth in the morning.   torsemide  (DEMADEX ) 20 MG tablet TAKE 1 TABLET (20 MG) BY MOUTH ALTERNATING WITH 1/2 TABLET (10 MG) BY MOUTH EVERY OTHER DAY.   triamcinolone  cream (KENALOG ) 0.5 % Apply 1 Application topically as needed (break out prevention).   VIMPAT  200 MG TABS tablet Take 200 mg by mouth 2 (two) times daily.   vitamin B-12 (CYANOCOBALAMIN ) 1000 MCG tablet Take 1,000 mcg by mouth in the morning.    Physical Exam:    VS:  BP 132/60 (BP Location: Right Arm, Patient Position: Sitting, Cuff Size: Normal)   Pulse 60   Ht 5' 8 (1.727 m)   Wt 240 lb (108.9 kg)   SpO2 94%   BMI 36.49 kg/m    Wt Readings from Last 3 Encounters:  12/14/23 240 lb  (108.9 kg)  12/12/23 249 lb (112.9 kg)  10/30/23 249 lb (112.9 kg)    GEN: Well nourished, well developed in no acute distress NECK: No JVD; No carotid bruits CARDIAC: RRR, no murmurs, rubs, gallops RESPIRATORY:  Clear to auscultation without rales, wheezing or rhonchi  ABDOMEN: Soft, non-tender, non-distended EXTREMITIES:  1+ BLE edema; No acute deformity   Asessement and Plan:.    1. Orthostasis - there were previous concerns about blood pressure tipping too high with BID dosing of midodrine , seems to be doing well on once daily midodrine . I suspect this is a function of his neurologic disease. Continue midodrine  2.5mg  daily.  2. Chronic HFimpEF with chronic edema - edema persists but unfortunately he has not tolerated higher doses of diuretics due to urination issues, and I think it would likely worsen his orthostasis as well. We discussed considering velcro strap compression hose today. He will elevate where he is able. Would avoid SGLT2i with hx of UTIs.   3. Mild CAD with chronic chest pain - not a salient complaint today. Not on ASA due to hx of ICH. Continue rosuvastatin  5mg  3x/weekly. Revisit lipid plan at next follow-up if not obtained by PCP in the interim.  4. H/o PVCs, PPM - followed by EP, recent OV felt stable. Dr. Lawana Pray recommended f/u 12 months. No longer on BB due to orthostasis and flat HR response.  5. Moderate AI, mild MR, mild dilation of aorta - stable by echo 10/2023, anticipate follow-up 10/2024, can revisit definitive timing at next OV.    Disposition: F/u with me in 3-4 months per family request, 12 months with EP.   Signed, Hanako Tipping N Taaliyah Delpriore, PA-C

## 2023-12-14 ENCOUNTER — Ambulatory Visit: Attending: Physician Assistant | Admitting: Physician Assistant

## 2023-12-14 ENCOUNTER — Encounter: Payer: Self-pay | Admitting: Physician Assistant

## 2023-12-14 VITALS — BP 132/60 | HR 60 | Ht 68.0 in | Wt 240.0 lb

## 2023-12-14 DIAGNOSIS — I5032 Chronic diastolic (congestive) heart failure: Secondary | ICD-10-CM | POA: Diagnosis present

## 2023-12-14 DIAGNOSIS — I951 Orthostatic hypotension: Secondary | ICD-10-CM | POA: Diagnosis present

## 2023-12-14 DIAGNOSIS — I351 Nonrheumatic aortic (valve) insufficiency: Secondary | ICD-10-CM | POA: Insufficient documentation

## 2023-12-14 DIAGNOSIS — I2510 Atherosclerotic heart disease of native coronary artery without angina pectoris: Secondary | ICD-10-CM | POA: Insufficient documentation

## 2023-12-14 DIAGNOSIS — I493 Ventricular premature depolarization: Secondary | ICD-10-CM | POA: Diagnosis present

## 2023-12-14 NOTE — Patient Instructions (Signed)
 Medication Instructions:  Your physician recommends that you continue on your current medications as directed. Please refer to the Current Medication list given to you today.  *If you need a refill on your cardiac medications before your next appointment, please call your pharmacy*  Lab Work: None If you have labs (blood work) drawn today and your tests are completely normal, you will receive your results only by: MyChart Message (if you have MyChart) OR A paper copy in the mail If you have any lab test that is abnormal or we need to change your treatment, we will call you to review the results.  Testing/Procedures: None  Follow-Up: At [facility], you and your health needs are our priority.  As part of our continuing mission to provide you with exceptional heart care, our providers are all part of one team.  This team includes your primary Cardiologist (physician) and Advanced Practice Providers or APPs (Physician Assistants and Nurse Practitioners) who all work together to provide you with the care you need, when you need it.  Your next appointment:   3-4 month(s)  Provider:   Dayna Dunn, PA-C        We recommend signing up for the patient portal called MyChart.  Sign up information is provided on this After Visit Summary.  MyChart is used to connect with patients for Virtual Visits (Telemedicine).  Patients are able to view lab/test results, encounter notes, upcoming appointments, etc.  Non-urgent messages can be sent to your provider as well.   To learn more about what you can do with MyChart, go to [email or web address].

## 2023-12-25 ENCOUNTER — Ambulatory Visit (INDEPENDENT_AMBULATORY_CARE_PROVIDER_SITE_OTHER): Admitting: Internal Medicine

## 2023-12-25 ENCOUNTER — Encounter: Payer: Self-pay | Admitting: Internal Medicine

## 2023-12-25 VITALS — BP 118/60 | HR 60 | Temp 98.0°F | Ht 68.0 in

## 2023-12-25 DIAGNOSIS — J069 Acute upper respiratory infection, unspecified: Secondary | ICD-10-CM | POA: Diagnosis not present

## 2023-12-25 MED ORDER — ROSUVASTATIN CALCIUM 5 MG PO TABS: 5.0000 mg | ORAL_TABLET | ORAL | 3 refills | Status: AC

## 2023-12-25 MED ORDER — BENZONATATE 100 MG PO CAPS: 100.0000 mg | ORAL_CAPSULE | Freq: Three times a day (TID) | ORAL | 0 refills | Status: AC | PRN

## 2023-12-25 MED ORDER — AMOXICILLIN-POT CLAVULANATE 875-125 MG PO TABS: 1.0000 | ORAL_TABLET | Freq: Two times a day (BID) | ORAL | 0 refills | Status: AC

## 2023-12-25 NOTE — Progress Notes (Signed)
 Subjective:    Patient ID: Jeff Weaver, male    DOB: October 25, 1945, 78 y.o.   MRN: [id number]      HPI Jeff Weaver is here for  Chief Complaint  Patient presents with   Cough    Cough, watery eyes, weakness, sleeping a lot past couple of days; Congestion started 2 days ago  He is here with his wife and daughter in the supplement his history secondary to his dementia.  He is here for an acute visit for cold symptoms.  His symptoms started several days - worse in last few days.  He is experiencing fatigue, nasal congestion, rhinorrhea, watery eyes, hoarseness, cough that sounds wet, shortness of breath, wheezing and possibly some headaches.  He is a poor historian so it is hard to know exactly what he is experiencing  Both his wife and daughter had similar illnesses recently and were on antibiotics.     Medications and allergies reviewed with patient and updated if appropriate.  Current Outpatient Medications on File Prior to Visit  Medication Sig Dispense Refill   acetaminophen  (TYLENOL ) 325 MG tablet Take 325-650 mg by mouth every 6 (six) hours as needed (pain/headaches.).     AMOXICILLIN  PO Take by mouth daily.     carbidopa-levodopa (SINEMET IR) 25-100 MG tablet as directed.     cetirizine  (ZYRTEC ) 10 MG tablet Take 1 tablet (10 mg total) by mouth daily. 30 tablet 11   Cholecalciferol  (VITAMIN D ) 50 MCG (2000 UT) CAPS Take 2,000 Units by mouth in the morning.     clonazePAM (KLONOPIN) 1 MG disintegrating tablet Take 1 mg by mouth as directed. ALLOW 1 TABLET TO DISSOLVE IN CHEEK FOR SEIZURES LASTING LONGER THAN 4 MINUTES. OR AFTER 2ND SEIZURE IN 24 HOURS PRN     clotrimazole  (LOTRIMIN ) 1 % cream Apply 1 Application topically 2 (two) times daily. 113 g 2   donepezil  (ARICEPT ) 10 MG tablet Take half a tablet in the morning for a month and then increase to 1 full tablet daily. 30 tablet 5   finasteride (PROSCAR) 5 MG tablet Take 5 mg by mouth daily.     ipratropium (ATROVENT ) 0.06 %  nasal spray Place 2 sprays into the nose as needed for rhinitis. 15 mL 5   ketoconazole  (NIZORAL ) 2 % cream Apply 1 Application topically daily as needed (for chest and axilla rash). 30 g 0   levETIRAcetam  (KEPPRA ) 500 MG tablet Take 3 tablets by mouth in the morning and at bedtime.     loperamide  (IMODIUM ) 2 MG capsule Take 1 capsule (2 mg total) by mouth as needed for diarrhea or loose stools. 30 capsule 5   LORazepam  (ATIVAN ) 1 MG tablet Take 1 mg by mouth every 6 (six) hours as needed for seizure.      Midazolam  (NAYZILAM ) 5 MG/0.1ML SOLN Place 1 each into the nose as needed (seizure).     midodrine  (PROAMATINE ) 2.5 MG tablet Take 1 tablet (2.5 mg total) by mouth every morning. 90 tablet 3   mirabegron  ER (MYRBETRIQ ) 50 MG TB24 tablet Take 50 mg by mouth daily.     mupirocin  ointment (BACTROBAN ) 2 % Apply 1 Application topically daily. For wound on left arm 30 g 2   nitroGLYCERIN  (NITROSTAT ) 0.4 MG SL tablet PLACE 1 TABLET (0.4 MG TOTAL) UNDER THE TONGUE EVERY 5 (FIVE) MINUTES AS NEEDED FOR CHEST PAIN. 25 tablet 11   nystatin  ointment (MYCOSTATIN ) Apply 1 Application topically 2 (two) times daily. 60 g 3  Propylene Glycol (SYSTANE BALANCE) 0.6 % SOLN Apply 1 drop to eye daily as needed (dry eyes).     pyridOXINE  (VITAMIN B-6) 100 MG tablet Take 100 mg by mouth in the morning.     rosuvastatin  (CRESTOR ) 5 MG tablet Take 1 tablet (5 mg total) by mouth 3 (three) times a week. 45 tablet 3   Tamsulosin  HCl (FLOMAX ) 0.4 MG CAPS Take 0.4 mg by mouth every evening.     thiamine  100 MG tablet Take 100 mg by mouth in the morning.     torsemide  (DEMADEX ) 20 MG tablet TAKE 1 TABLET (20 MG) BY MOUTH ALTERNATING WITH 1/2 TABLET (10 MG) BY MOUTH EVERY OTHER DAY. 60 tablet 3   triamcinolone  cream (KENALOG ) 0.5 % Apply 1 Application topically as needed (break out prevention).     VIMPAT  200 MG TABS tablet Take 200 mg by mouth 2 (two) times daily.     vitamin B-12 (CYANOCOBALAMIN ) 1000 MCG tablet Take 1,000  mcg by mouth in the morning.     No current facility-administered medications on file prior to visit.    Review of Systems  Constitutional:  Positive for fatigue. Negative for fever.  HENT:  Positive for congestion, rhinorrhea and voice change. Negative for sore throat.   Eyes:  Positive for discharge.  Respiratory:  Positive for cough (sounds wet), shortness of breath and wheezing.   Musculoskeletal:  Positive for myalgias (increased).  Neurological:  Positive for headaches (occ). Negative for dizziness.       Objective:   Vitals:   12/25/23 1608  BP: 118/60  Pulse: 60  Temp: 98 F (36.7 C)  SpO2: 92%   BP Readings from Last 3 Encounters:  12/25/23 118/60  12/14/23 132/60  12/12/23 111/74   Wt Readings from Last 3 Encounters:  12/14/23 240 lb (108.9 kg)  12/12/23 249 lb (112.9 kg)  10/30/23 249 lb (112.9 kg)   Body mass index is 36.49 kg/m.    Physical Exam Constitutional:      General: He is not in acute distress.    Appearance: Normal appearance. He is not ill-appearing.  HENT:     Head: Normocephalic.     Right Ear: Tympanic membrane, ear canal and external ear normal. There is no impacted cerumen.     Left Ear: Tympanic membrane, ear canal and external ear normal. There is no impacted cerumen.     Mouth/Throat:     Mouth: Mucous membranes are moist.     Pharynx: No oropharyngeal exudate or posterior oropharyngeal erythema.   Eyes:     Conjunctiva/sclera: Conjunctivae normal.     Comments: Both eyes watery   Cardiovascular:     Rate and Rhythm: Normal rate and regular rhythm.  Pulmonary:     Effort: No respiratory distress.     Breath sounds: Wheezing (Coarse breath sounds, possibly wheezing) present. No rales.     Comments: Poor effort  Musculoskeletal:     Cervical back: Neck supple. No tenderness.  Lymphadenopathy:     Cervical: No cervical adenopathy.   Skin:    General: Skin is warm and dry.     Findings: No rash.   Neurological:      Mental Status: He is alert.            Assessment & Plan:    See Problem List for Assessment and Plan of chronic medical problems.

## 2023-12-25 NOTE — Assessment & Plan Note (Signed)
 Acute Symptoms concerning for possible bacterial infection Both his wife and daughter had similar illness recently and were on antibiotics Given his comorbidities and risk of seizure need to treat Start Augmentin  875-125 mg twice daily x 10 days Tessalon  Perles 100 mg 3 times daily as needed They will call if the symptoms or not improving

## 2023-12-25 NOTE — Patient Instructions (Addendum)
        Medications changes include :   Augmentin  twice daily and cough pills     Return if symptoms worsen or fail to improve. He is here with his

## 2023-12-28 ENCOUNTER — Encounter: Payer: Self-pay | Admitting: Internal Medicine

## 2023-12-28 ENCOUNTER — Ambulatory Visit: Payer: Self-pay | Admitting: Internal Medicine

## 2023-12-28 ENCOUNTER — Ambulatory Visit (HOSPITAL_BASED_OUTPATIENT_CLINIC_OR_DEPARTMENT_OTHER)
Admission: RE | Admit: 2023-12-28 | Discharge: 2023-12-28 | Disposition: A | Discharge status: Home / Self Care | Source: Ambulatory Visit | Attending: Internal Medicine | Admitting: Internal Medicine

## 2023-12-28 DIAGNOSIS — R051 Acute cough: Secondary | ICD-10-CM | POA: Diagnosis present

## 2024-01-02 NOTE — Progress Notes (Signed)
 Remote pacemaker transmission.

## 2024-01-11 ENCOUNTER — Ambulatory Visit (HOSPITAL_BASED_OUTPATIENT_CLINIC_OR_DEPARTMENT_OTHER)
Admission: RE | Admit: 2024-01-11 | Discharge: 2024-01-11 | Disposition: A | Discharge status: Home / Self Care | Source: Ambulatory Visit | Attending: Internal Medicine | Admitting: Internal Medicine

## 2024-01-11 ENCOUNTER — Ambulatory Visit: Payer: Self-pay | Admitting: Internal Medicine

## 2024-01-11 DIAGNOSIS — Z0000 Encounter for general adult medical examination without abnormal findings: Secondary | ICD-10-CM | POA: Insufficient documentation

## 2024-01-11 DIAGNOSIS — M810 Age-related osteoporosis without current pathological fracture: Secondary | ICD-10-CM | POA: Insufficient documentation

## 2024-01-18 ENCOUNTER — Other Ambulatory Visit: Payer: Self-pay | Admitting: Cardiology

## 2024-02-15 NOTE — Progress Notes (Signed)
 Subjective:    Patient ID: Jeff Weaver, male    DOB: 1945-10-04, 78 y.o.   MRN: [id number]      HPI [name] is here for  Chief Complaint  Patient presents with   Shortness of Breath   Abdominal Pain    Abdominal pain all around his stomach   Knee Pain    Bilateral knee pain  He is here with his wife who supplement the history due to his dementia.    Discussed the use of AI scribe software for clinical note transcription with the patient, who gave verbal consent to proceed.  History of Present Illness Jeff Weaver is a 78 year old male with hypertension and seizures who presents with chest and abdominal pain.    He experiences intermittent, soft, non-radiating pain that originates in the abdomen and ascends to the chest. This discomfort occurs sporadically, even at rest, and is described as a sensation that makes him feel unwell rather than sharp pain. He has been experiencing this for some time but has not consistently reported it to other doctors. Two weeks ago, he considered visiting the emergency room due to the pain but decided against it.  He his currently not taking any medications for hypertension.  He is on midodrine  for hypotension.  He has a history of low blood pressure and sometimes feels lightheaded or dizzy, especially when standing up. Cardiology recently discontinued some of his blood pressure medications, and there has been confusion about when to take the midodrine .  He experiences dizziness, potentially related to his medications, Vimpat  and Keppra , which are known to cause dizziness. His Keppra  dosage has been adjusted in the past to prevent seizures, which occur if the dosage is lowered.  He suffers from significant knee pain attributed to arthritis, making it difficult for him to stand from a seated position without assistance. He takes Tylenol  325 mg, two tablets at a time, but there is confusion about the frequency of administration. He is concerned about  exceeding the maximum daily dose of 3000 mg.  He has a cough, particularly at night, which he associates with swallowing difficulties. He sometimes experiences food getting caught, leading to coughing fits. He has a history of mild aspiration but not severe.  He experiences swelling in his legs, particularly the right leg, which has worsened over the past month. The swelling is significant enough to leave deep imprints from socks. He elevates his legs, but his wife is not sure how consistently he is doing this.  He takes torasemide, alternating between a whole pill and a half pill, but has not noticed a significant change in swelling.  He experiences vivid dreams, often related to his [employer], which have increased in frequency over the past month.  He has vision issues, including double vision, despite having a prism  in one lens of his glasses. He reports difficulty reading and seeing clearly at different distances.     Medications and allergies reviewed with patient and updated if appropriate.  Current Outpatient Medications on File Prior to Visit  Medication Sig Dispense Refill   acetaminophen  (TYLENOL ) 325 MG tablet Take 325-650 mg by mouth every 6 (six) hours as needed (pain/headaches.).     AMOXICILLIN  PO Take by mouth daily.     benzonatate  (TESSALON ) 100 MG capsule Take 1 capsule (100 mg total) by mouth 3 (three) times daily as needed for cough. 30 capsule 0   carbidopa-levodopa (SINEMET IR) 25-100 MG tablet as directed.  cetirizine  (ZYRTEC ) 10 MG tablet Take 1 tablet (10 mg total) by mouth daily. 30 tablet 11   Cholecalciferol  (VITAMIN D ) 50 MCG (2000 UT) CAPS Take 2,000 Units by mouth in the morning.     clonazePAM (KLONOPIN) 1 MG disintegrating tablet Take 1 mg by mouth as directed. ALLOW 1 TABLET TO DISSOLVE IN CHEEK FOR SEIZURES LASTING LONGER THAN 4 MINUTES. OR AFTER 2ND SEIZURE IN 24 HOURS PRN     clotrimazole  (LOTRIMIN ) 1 % cream Apply 1 Application topically 2  (two) times daily. 113 g 2   donepezil  (ARICEPT ) 10 MG tablet Take half a tablet in the morning for a month and then increase to 1 full tablet daily. 30 tablet 5   finasteride (PROSCAR) 5 MG tablet Take 5 mg by mouth daily.     ketoconazole  (NIZORAL ) 2 % cream Apply 1 Application topically daily as needed (for chest and axilla rash). 30 g 0   levETIRAcetam  (KEPPRA ) 500 MG tablet Take 3 tablets by mouth in the morning and at bedtime.     loperamide  (IMODIUM ) 2 MG capsule Take 1 capsule (2 mg total) by mouth as needed for diarrhea or loose stools. 30 capsule 5   LORazepam  (ATIVAN ) 1 MG tablet Take 1 mg by mouth every 6 (six) hours as needed for seizure.      Midazolam  (NAYZILAM ) 5 MG/0.1ML SOLN Place 1 each into the nose as needed (seizure).     midodrine  (PROAMATINE ) 2.5 MG tablet Take 1 tablet (2.5 mg total) by mouth every morning. 90 tablet 3   mirabegron  ER (MYRBETRIQ ) 50 MG TB24 tablet Take 50 mg by mouth daily.     mupirocin  ointment (BACTROBAN ) 2 % Apply 1 Application topically daily. For wound on left arm 30 g 2   nitroGLYCERIN  (NITROSTAT ) 0.4 MG SL tablet PLACE 1 TABLET (0.4 MG TOTAL) UNDER THE TONGUE EVERY 5 (FIVE) MINUTES AS NEEDED FOR CHEST PAIN. 25 tablet 11   nystatin  ointment (MYCOSTATIN ) Apply 1 Application topically 2 (two) times daily. 60 g 3   Propylene Glycol (SYSTANE BALANCE) 0.6 % SOLN Apply 1 drop to eye daily as needed (dry eyes).     pyridOXINE  (VITAMIN B-6) 100 MG tablet Take 100 mg by mouth in the morning.     rosuvastatin  (CRESTOR ) 5 MG tablet Take 1 tablet (5 mg total) by mouth 3 (three) times a week. 45 tablet 3   Tamsulosin  HCl (FLOMAX ) 0.4 MG CAPS Take 0.4 mg by mouth every evening.     thiamine  100 MG tablet Take 100 mg by mouth in the morning.     torsemide  (DEMADEX ) 20 MG tablet TAKE 1 TABLET (20 MG) BY MOUTH ALTERNATING WITH 1/2 TABLET (10 MG) BY MOUTH EVERY OTHER DAY. 75 tablet 3   triamcinolone  cream (KENALOG ) 0.5 % Apply 1 Application topically as needed (break  out prevention).     VIMPAT  200 MG TABS tablet Take 200 mg by mouth 2 (two) times daily.     vitamin B-12 (CYANOCOBALAMIN ) 1000 MCG tablet Take 1,000 mcg by mouth in the morning.     No current facility-administered medications on file prior to visit.    Review of Systems  Respiratory:  Positive for shortness of breath (chronic - more than usual).   Cardiovascular:  Positive for chest pain and leg swelling (more than usual). Negative for palpitations.  Gastrointestinal:  Positive for abdominal pain.  Musculoskeletal:  Positive for arthralgias (knee b/l).  Neurological:  Positive for dizziness and light-headedness (just a little).  Objective:   Vitals:   02/16/24 1506 02/16/24 1515  BP: 118/76 104/68  Pulse: 100   Temp: 98.7 F (37.1 C)   SpO2: 100%    BP Readings from Last 3 Encounters:  02/16/24 104/68  12/25/23 118/60  12/14/23 132/60   Wt Readings from Last 3 Encounters:  12/14/23 240 lb (108.9 kg)  12/12/23 249 lb (112.9 kg)  10/30/23 249 lb (112.9 kg)   Body mass index is 36.49 kg/m.    Physical Exam Constitutional:      General: He is not in acute distress.    Appearance: Normal appearance. He is not ill-appearing.  HENT:     Head: Normocephalic and atraumatic.  Eyes:     Conjunctiva/sclera: Conjunctivae normal.  Cardiovascular:     Rate and Rhythm: Normal rate and regular rhythm.     Heart sounds: Normal heart sounds.  Pulmonary:     Effort: Pulmonary effort is normal. No respiratory distress.     Breath sounds: Normal breath sounds. No wheezing or rales.  Musculoskeletal:     Right lower leg: Edema (1 + pitting 1/2 leg) present.     Left lower leg: Edema (1 + pitting 1/2 leg) present.  Skin:    General: Skin is warm and dry.     Findings: No rash.  Neurological:     Mental Status: He is alert. Mental status is at baseline.  Psychiatric:        Mood and Affect: Mood normal.            Assessment & Plan:    See Problem List for  Assessment and Plan of chronic medical problems.

## 2024-02-16 ENCOUNTER — Ambulatory Visit (INDEPENDENT_AMBULATORY_CARE_PROVIDER_SITE_OTHER): Admitting: Internal Medicine

## 2024-02-16 ENCOUNTER — Encounter: Payer: Self-pay | Admitting: Internal Medicine

## 2024-02-16 VITALS — BP 104/68 | HR 100 | Temp 98.7°F | Ht 68.0 in

## 2024-02-16 DIAGNOSIS — R600 Localized edema: Secondary | ICD-10-CM | POA: Insufficient documentation

## 2024-02-16 DIAGNOSIS — I5023 Acute on chronic systolic (congestive) heart failure: Secondary | ICD-10-CM

## 2024-02-16 DIAGNOSIS — R079 Chest pain, unspecified: Secondary | ICD-10-CM

## 2024-02-16 DIAGNOSIS — G3183 Dementia with Lewy bodies: Secondary | ICD-10-CM

## 2024-02-16 DIAGNOSIS — R1310 Dysphagia, unspecified: Secondary | ICD-10-CM | POA: Diagnosis not present

## 2024-02-16 DIAGNOSIS — F0280 Dementia in other diseases classified elsewhere without behavioral disturbance: Secondary | ICD-10-CM

## 2024-02-16 DIAGNOSIS — M170 Bilateral primary osteoarthritis of knee: Secondary | ICD-10-CM | POA: Insufficient documentation

## 2024-02-16 DIAGNOSIS — I951 Orthostatic hypotension: Secondary | ICD-10-CM

## 2024-02-16 LAB — COMPREHENSIVE METABOLIC PANEL WITH GFR
ALT: 14 U/L (ref 0–53)
AST: 18 U/L (ref 0–37)
Albumin: 4 g/dL (ref 3.5–5.2)
Alkaline Phosphatase: 46 U/L (ref 39–117)
BUN: 19 mg/dL (ref 6–23)
CO2: 30 meq/L (ref 19–32)
Calcium: 9.3 mg/dL (ref 8.4–10.5)
Chloride: 98 meq/L (ref 96–112)
Creatinine, Ser: 0.89 mg/dL (ref 0.40–1.50)
GFR: 81.37 mL/min (ref >60.00)
Glucose, Bld: 91 mg/dL (ref 70–99)
Potassium: 4.2 meq/L (ref 3.5–5.1)
Sodium: 137 meq/L (ref 135–145)
Total Bilirubin: 0.7 mg/dL (ref 0.2–1.2)
Total Protein: 6.8 g/dL (ref 6.0–8.3)

## 2024-02-16 LAB — CBC WITH DIFFERENTIAL/PLATELET
Basophils Absolute: 0.1 K/uL (ref 0.0–0.1)
Basophils Relative: 0.9 % (ref 0.0–3.0)
Eosinophils Absolute: 0.2 K/uL (ref 0.0–0.7)
Eosinophils Relative: 3.4 % (ref 0.0–5.0)
HCT: 27.1 % — ABNORMAL LOW (ref 39.0–52.0)
Hemoglobin: 9.3 g/dL — ABNORMAL LOW (ref 13.0–17.0)
Lymphocytes Relative: 26.6 % (ref 12.0–46.0)
Lymphs Abs: 1.6 K/uL (ref 0.7–4.0)
MCHC: 34.4 g/dL (ref 30.0–36.0)
MCV: 93.2 fl (ref 78.0–100.0)
Monocytes Absolute: 0.4 K/uL (ref 0.1–1.0)
Monocytes Relative: 7.3 % (ref 3.0–12.0)
Neutro Abs: 3.7 K/uL (ref 1.4–7.7)
Neutrophils Relative %: 61.8 % (ref 43.0–77.0)
Platelets: 126 K/uL — ABNORMAL LOW (ref 150.0–400.0)
RBC: 2.91 Mil/uL — ABNORMAL LOW (ref 4.22–5.81)
RDW: 14.1 % (ref 11.5–15.5)
WBC: 6.1 K/uL (ref 4.0–10.5)

## 2024-02-16 LAB — BRAIN NATRIURETIC PEPTIDE: Pro B Natriuretic peptide (BNP): 154 pg/mL — ABNORMAL HIGH (ref 0.0–100.0)

## 2024-02-16 MED ORDER — IPRATROPIUM BROMIDE 0.06 % NA SOLN: 2.0000 | NASAL | 5 refills | Status: AC | PRN

## 2024-02-16 NOTE — Assessment & Plan Note (Addendum)
 Chronic Coughing with eating - has mild aspiration Has had swallowing evaluation Has additional testing coming up

## 2024-02-16 NOTE — Patient Instructions (Addendum)
    Blood work was ordered.       Medications changes include :   if SBP < 110 in the morning take the midodrine .  Take 10 mg ( 1 full pill) Saturday and Sunday      Cardiology - Dr Barbaraann, Dr Emilee, Dr Pietro

## 2024-02-16 NOTE — Assessment & Plan Note (Signed)
 Chronic Taking tylenol  regularly  Can ice, heat Has used topical medications Using walker

## 2024-02-17 NOTE — Assessment & Plan Note (Signed)
 Sounds somewhat chronic  Intermittent Not related to activity He is a poor historian making it difficult to get a good grasp on his symptoms Advised seeing cardio again if pain persists but I am not sure if this is cardiac

## 2024-02-17 NOTE — Assessment & Plan Note (Signed)
 Chronic Increased edema Also with increased SOB - likely fluid overloaded related to acute on chronic HFrEF Increase torsemide  for a few days - two days of higher dose Elevate legs consistently.

## 2024-02-17 NOTE — Assessment & Plan Note (Signed)
 Chronic Following with neurology This may be causing some of his vivid dreams

## 2024-02-17 NOTE — Assessment & Plan Note (Signed)
 Chronic No longer on hypertensive meds His wife is unclear when to give him midodrine  ? Not feeling well at times related to orthostasis / lightheadedness Advised if SBP < 110 to give midodrine  - can take tid if needed

## 2024-02-17 NOTE — Assessment & Plan Note (Signed)
 Acute on chronic HFrEF Increased leg swelling and increased chronic DOE Cbc, cmp, bnp Taking torsemide  20 mg three times per week, 10 mg ROW  Advised to take 20 mg today, Saturday, Sunday and Monday  Stressed elevating legs when sitting consistently Depending on response may need to adjust torsemide  dose Check weights daily

## 2024-02-18 ENCOUNTER — Ambulatory Visit: Payer: Self-pay | Admitting: Internal Medicine

## 2024-02-18 DIAGNOSIS — D649 Anemia, unspecified: Secondary | ICD-10-CM

## 2024-02-18 NOTE — Telephone Encounter (Signed)
 Please call  -  His kidney and liver tests are normal.   His blood work shows some fluid overload - hopefully the increase water pill for a couple of extra days has helped.    He has anemia which is new since April.  Has there been any blood in his stool or black stool?   Anemia can be concerning for possible GI bleed.  We need to recheck his blood work Monday or Tuesday and check his iron  levels.    Blood work ordered.

## 2024-02-19 ENCOUNTER — Other Ambulatory Visit (INDEPENDENT_AMBULATORY_CARE_PROVIDER_SITE_OTHER)

## 2024-02-19 DIAGNOSIS — D649 Anemia, unspecified: Secondary | ICD-10-CM | POA: Diagnosis not present

## 2024-02-19 LAB — IBC PANEL
Iron: 214 ug/dL — ABNORMAL HIGH (ref 42–165)
Saturation Ratios: 69.2 % — ABNORMAL HIGH (ref 20.0–50.0)
TIBC: 309.4 ug/dL (ref 250.0–450.0)
Transferrin: 221 mg/dL (ref 212.0–360.0)

## 2024-02-19 LAB — CBC WITH DIFFERENTIAL/PLATELET
Basophils Absolute: 0 K/uL (ref 0.0–0.1)
Basophils Relative: 0.2 % (ref 0.0–3.0)
Eosinophils Absolute: 0.2 K/uL (ref 0.0–0.7)
Eosinophils Relative: 3.7 % (ref 0.0–5.0)
HCT: 24.7 % — ABNORMAL LOW (ref 39.0–52.0)
Hemoglobin: 8.5 g/dL — ABNORMAL LOW (ref 13.0–17.0)
Lymphocytes Relative: 20 % (ref 12.0–46.0)
Lymphs Abs: 1.1 K/uL (ref 0.7–4.0)
MCHC: 34.4 g/dL (ref 30.0–36.0)
MCV: 92.6 fl (ref 78.0–100.0)
Monocytes Absolute: 0.6 K/uL (ref 0.1–1.0)
Monocytes Relative: 10.2 % (ref 3.0–12.0)
Neutro Abs: 3.6 K/uL (ref 1.4–7.7)
Neutrophils Relative %: 65.9 % (ref 43.0–77.0)
Platelets: 110 K/uL — ABNORMAL LOW (ref 150.0–400.0)
RBC: 2.67 Mil/uL — ABNORMAL LOW (ref 4.22–5.81)
RDW: 13.7 % (ref 11.5–15.5)
WBC: 5.4 K/uL (ref 4.0–10.5)

## 2024-02-19 LAB — FERRITIN: Ferritin: 216.7 ng/mL (ref 22.0–322.0)

## 2024-02-20 LAB — HAPTOGLOBIN: Haptoglobin: 47 mg/dL (ref 43–212)

## 2024-02-20 LAB — VITAMIN B12: Vitamin B-12: 845 pg/mL (ref 211–911)

## 2024-02-20 LAB — RETICULOCYTES
ABS Retic: 10440 {cells}/uL — ABNORMAL LOW (ref 25000–90000)
Retic Ct Pct: 0.4 %

## 2024-02-21 ENCOUNTER — Emergency Department (HOSPITAL_COMMUNITY)
Admission: EM | Admit: 2024-02-21 | Discharge: 2024-02-21 | Disposition: A | Discharge status: Home / Self Care | Attending: Emergency Medicine | Admitting: Emergency Medicine

## 2024-02-21 ENCOUNTER — Other Ambulatory Visit: Payer: Self-pay

## 2024-02-21 ENCOUNTER — Encounter (HOSPITAL_COMMUNITY): Payer: Self-pay

## 2024-02-21 ENCOUNTER — Encounter: Payer: Self-pay | Admitting: Internal Medicine

## 2024-02-21 ENCOUNTER — Emergency Department (HOSPITAL_COMMUNITY)

## 2024-02-21 DIAGNOSIS — D649 Anemia, unspecified: Secondary | ICD-10-CM | POA: Diagnosis not present

## 2024-02-21 DIAGNOSIS — R600 Localized edema: Secondary | ICD-10-CM | POA: Diagnosis present

## 2024-02-21 DIAGNOSIS — R109 Unspecified abdominal pain: Secondary | ICD-10-CM | POA: Diagnosis present

## 2024-02-21 DIAGNOSIS — H532 Diplopia: Secondary | ICD-10-CM | POA: Diagnosis not present

## 2024-02-21 DIAGNOSIS — R531 Weakness: Secondary | ICD-10-CM | POA: Diagnosis present

## 2024-02-21 DIAGNOSIS — M6281 Muscle weakness (generalized): Secondary | ICD-10-CM | POA: Insufficient documentation

## 2024-02-21 LAB — COMPREHENSIVE METABOLIC PANEL WITH GFR
ALT: 9 U/L (ref 0–44)
AST: 23 U/L (ref 15–41)
Albumin: 3.6 g/dL (ref 3.5–5.0)
Alkaline Phosphatase: 41 U/L (ref 38–126)
Anion gap: 9 (ref 5–15)
BUN: 15 mg/dL (ref 8–23)
CO2: 29 mmol/L (ref 22–32)
Calcium: 9.3 mg/dL (ref 8.9–10.3)
Chloride: 98 mmol/L (ref 98–111)
Creatinine, Ser: 0.96 mg/dL (ref 0.61–1.24)
GFR, Estimated: >60 mL/min (ref >60)
Glucose, Bld: 97 mg/dL (ref 70–99)
Potassium: 3.8 mmol/L (ref 3.5–5.1)
Sodium: 136 mmol/L (ref 135–145)
Total Bilirubin: 0.8 mg/dL (ref 0.0–1.2)
Total Protein: 6.8 g/dL (ref 6.5–8.1)

## 2024-02-21 LAB — IRON AND TIBC
Iron: 172 ug/dL (ref 45–182)
Saturation Ratios: 46 % — ABNORMAL HIGH (ref 17.9–39.5)
TIBC: 371 ug/dL (ref 250–450)
UIBC: 199 ug/dL

## 2024-02-21 LAB — TYPE AND SCREEN
ABO/RH(D): A POS
Antibody Screen: NEGATIVE

## 2024-02-21 LAB — URINALYSIS, ROUTINE W REFLEX MICROSCOPIC
Bilirubin Urine: NEGATIVE
Glucose, UA: NEGATIVE mg/dL
Hgb urine dipstick: NEGATIVE
Ketones, ur: NEGATIVE mg/dL
Nitrite: NEGATIVE
Protein, ur: NEGATIVE mg/dL
Specific Gravity, Urine: 1.004 — ABNORMAL LOW (ref 1.005–1.030)
pH: 6 (ref 5.0–8.0)

## 2024-02-21 LAB — FERRITIN: Ferritin: 249 ng/mL (ref 24–336)

## 2024-02-21 LAB — TROPONIN I (HIGH SENSITIVITY)
Troponin I (High Sensitivity): 15 ng/L (ref <18)
Troponin I (High Sensitivity): 15 ng/L (ref <18)

## 2024-02-21 LAB — CBC
HCT: 25 % — ABNORMAL LOW (ref 39.0–52.0)
Hemoglobin: 8.6 g/dL — ABNORMAL LOW (ref 13.0–17.0)
MCH: 33.1 pg (ref 26.0–34.0)
MCHC: 34.4 g/dL (ref 30.0–36.0)
MCV: 96.2 fL (ref 80.0–100.0)
Platelets: 104 K/uL — ABNORMAL LOW (ref 150–400)
RBC: 2.6 MIL/uL — ABNORMAL LOW (ref 4.22–5.81)
RDW: 13.2 % (ref 11.5–15.5)
WBC: 5.3 K/uL (ref 4.0–10.5)
nRBC: 0.4 % — ABNORMAL HIGH (ref 0.0–0.2)

## 2024-02-21 LAB — ABO/RH: ABO/RH(D): A POS

## 2024-02-21 LAB — FOLATE: Folate: 7.5 ng/mL (ref >5.9)

## 2024-02-21 LAB — BRAIN NATRIURETIC PEPTIDE: B Natriuretic Peptide: 160.5 pg/mL — ABNORMAL HIGH (ref 0.0–100.0)

## 2024-02-21 LAB — VITAMIN B12: Vitamin B-12: 914 pg/mL (ref 180–914)

## 2024-02-21 LAB — POC OCCULT BLOOD, ED: Fecal Occult Bld: NEGATIVE

## 2024-02-21 MED ORDER — IRON (FERROUS SULFATE) 325 (65 FE) MG PO TABS
1.0000 | ORAL_TABLET | Freq: Two times a day (BID) | ORAL | 0 refills | Status: DC
Start: 2024-02-21 — End: 2024-04-05

## 2024-02-21 NOTE — ED Triage Notes (Signed)
 C/O SHOB, abd pain/ weakness/ tired. Pt went to PCP and PCP stated hbg was low. No blood in stool.

## 2024-02-21 NOTE — Discharge Instructions (Addendum)
 1.  Continue to work closely with your doctor to get you referrals to hematology and other referrals if needed. 2.  At this time you can start an iron  supplement. 3.  Return to the Emergency Department immediately if you feel like you are lightheaded and going to pass out, have changing or worsening pain, or other concerning symptoms. 4.  You have been started on iron  supplement.  This can make your stool look very dark or black.  At this time, your stool is not testing positive for blood.  If you see any red blood or a significant change unanticipated, return for recheck.

## 2024-02-21 NOTE — ED Provider Notes (Signed)
 [facility] [facility] AT [facility] Provider Note   CSN: [id number] Arrival date & time: 02/21/24  1257     Patient presents with: Weakness   Jeff Weaver is a 78 y.o. male.   HPI Patient was referred to the emergency department for further evaluation by his PCP.  He has been having some generalized weakness over the past few weeks as well as drop in his hemoglobin.  Patient reports he is requiring more help now to stand to transfer and ambulate.  He reports whereas he used to have more upper body strength to push himself up out of his chair and support himself, he now has less.  He has not developed any focal weakness or numbness.  Patient denies he has had dark black or bloody looking stool.  Reports sometimes stool some straining.  He has not had any vomiting.  Patient reports he is felt mildly short of breath at times.  Patient's PCP had him increased torsemide  dosing over the weekend.  Patient had previously been on every other day dosing and he was on full tablet dosing over the weekend and Monday with half a tablet yesterday and full tablet today.  For a number of months he has had intermittent abdominal pains that radiate from the upper and central abdomen up towards the left chest in the area of his pacemaker.  It has been opined that this might be scar tissue.  This is not a new problem at this time.  Patient also has chronic double vision.  This is also not a new problem.    Prior to Admission medications   Medication Sig Start Date End Date Taking? Authorizing Provider  Iron , Ferrous Sulfate , 325 (65 Fe) MG TABS Take 1 tablet by mouth 2 (two) times daily. 02/21/24  Yes Armenta Canning, MD  acetaminophen  (TYLENOL ) 325 MG tablet Take 325-650 mg by mouth every 6 (six) hours as needed (pain/headaches.).    [provider]  AMOXICILLIN  PO Take by mouth daily.    [provider]  benzonatate  (TESSALON ) 100 MG capsule Take 1 capsule (100 mg total) by  mouth 3 (three) times daily as needed for cough. 12/25/23   Geofm Glade PARAS, MD  carbidopa-levodopa (SINEMET IR) 25-100 MG tablet as directed. 06/09/22   [provider]  cetirizine  (ZYRTEC ) 10 MG tablet Take 1 tablet (10 mg total) by mouth daily. 10/26/15   Geofm Glade PARAS, MD  Cholecalciferol  (VITAMIN D ) 50 MCG (2000 UT) CAPS Take 2,000 Units by mouth in the morning.    [provider]  clonazePAM (KLONOPIN) 1 MG disintegrating tablet Take 1 mg by mouth as directed. ALLOW 1 TABLET TO DISSOLVE IN CHEEK FOR SEIZURES LASTING LONGER THAN 4 MINUTES. OR AFTER 2ND SEIZURE IN 24 HOURS PRN 11/05/19 02/16/24  [provider]  clotrimazole  (LOTRIMIN ) 1 % cream Apply 1 Application topically 2 (two) times daily. 10/31/23   Geofm Glade PARAS, MD  donepezil  (ARICEPT ) 10 MG tablet Take half a tablet in the morning for a month and then increase to 1 full tablet daily. 05/06/21     finasteride (PROSCAR) 5 MG tablet Take 5 mg by mouth daily. 08/24/22   [provider]  ipratropium (ATROVENT ) 0.06 % nasal spray Place 2 sprays into the nose as needed for rhinitis. 02/16/24   Geofm Glade PARAS, MD  ketoconazole  (NIZORAL ) 2 % cream Apply 1 Application topically daily as needed (for chest and axilla rash). 09/20/23   Geofm Glade PARAS, MD  levETIRAcetam  (KEPPRA ) 500 MG tablet Take 3 tablets by mouth in the morning and at bedtime. 01/28/22   [provider]  loperamide  (IMODIUM ) 2 MG capsule Take 1 capsule (2 mg total) by mouth as needed for diarrhea or loose stools. 10/31/23   Geofm Glade PARAS, MD  LORazepam  (ATIVAN ) 1 MG tablet Take 1 mg by mouth every 6 (six) hours as needed for seizure.     [provider]  Midazolam  (NAYZILAM ) 5 MG/0.1ML SOLN Place 1 each into the nose as needed (seizure).    [provider]  midodrine  (PROAMATINE ) 2.5 MG tablet Take 1 tablet (2.5 mg total) by mouth every morning. 11/06/23   Dunn, Dayna N, PA-C  mirabegron  ER (MYRBETRIQ ) 50 MG TB24 tablet Take 50 mg  by mouth daily.    [provider]  mupirocin  ointment (BACTROBAN ) 2 % Apply 1 Application topically daily. For wound on left arm 09/20/23   Burns, Glade PARAS, MD  nitroGLYCERIN  (NITROSTAT ) 0.4 MG SL tablet PLACE 1 TABLET (0.4 MG TOTAL) UNDER THE TONGUE EVERY 5 (FIVE) MINUTES AS NEEDED FOR CHEST PAIN. 10/31/23   Geofm Glade PARAS, MD  nystatin  ointment (MYCOSTATIN ) Apply 1 Application topically 2 (two) times daily. 10/31/23   Geofm Glade PARAS, MD  Propylene Glycol (SYSTANE BALANCE) 0.6 % SOLN Apply 1 drop to eye daily as needed (dry eyes).    [provider]  pyridOXINE  (VITAMIN B-6) 100 MG tablet Take 100 mg by mouth in the morning.    [provider]  rosuvastatin  (CRESTOR ) 5 MG tablet Take 1 tablet (5 mg total) by mouth 3 (three) times a week. 12/25/23   Geofm Glade PARAS, MD  Tamsulosin  HCl (FLOMAX ) 0.4 MG CAPS Take 0.4 mg by mouth every evening.    [provider]  thiamine  100 MG tablet Take 100 mg by mouth in the morning.    [provider]  torsemide  (DEMADEX ) 20 MG tablet TAKE 1 TABLET (20 MG) BY MOUTH ALTERNATING WITH 1/2 TABLET (10 MG) BY MOUTH EVERY OTHER DAY. 01/18/24   Dunn, Dayna N, PA-C  triamcinolone  cream (KENALOG ) 0.5 % Apply 1 Application topically as needed (break out prevention). 10/13/21   [provider]  VIMPAT  200 MG TABS tablet Take 200 mg by mouth 2 (two) times daily. 09/03/19   [provider]  vitamin B-12 (CYANOCOBALAMIN ) 1000 MCG tablet Take 1,000 mcg by mouth in the morning.    [provider]    Allergies: Haloperidol, Rosuvastatin , Simvastatin, Lisinopril, Nifedipine , and Pravastatin     Review of Systems  Updated Vital Signs BP (!) 139/59   Pulse (!) 59   Temp 98 F (36.7 C) (Oral)   Resp 14   Ht 5' 6 (1.676 m)   Wt 110.7 kg   SpO2 100%   BMI 39.38 kg/m   Physical Exam Constitutional:      Comments: Alert nontoxic no respiratory distress  HENT:     Mouth/Throat:     Pharynx: Oropharynx is  clear.  Eyes:     Extraocular Movements: Extraocular movements intact.  Cardiovascular:     Rate and Rhythm: Normal rate and regular rhythm.  Pulmonary:     Effort: Pulmonary effort is normal.     Breath sounds: Normal breath sounds.  Abdominal:     General: There is no distension.     Palpations: Abdomen is soft.     Tenderness: There is no guarding.     Comments: Patient is abdomen soft without guarding.  He has  minimally pain in the left central abdomen.  No guarding.  He perceives bladder fullness when I press over the lower abdomen but no pain.  Genitourinary:    Comments: Rectal exam brown stool in the vault.  Semiformed.  No blood no melena. Musculoskeletal:     Comments: 2+ edema bilateral lower extremity symmetric.  Calves soft and pliable.  Skin:    General: Skin is warm and dry.  Neurological:     General: No focal deficit present.     Comments: Patient is alert and interactive.  He is speaking in full sentences.  He does defer to members for some issues of recall and history.  He is appropriately describing his symptoms.  Patient has no focal motor deficit.  He does have general deconditioning and muscular weakness.  However he can assist in using both upper extremities to pull himself forward in the stretcher.  No focal weakness of lower extremities however not ambulated at this time.  Psychiatric:        Mood and Affect: Mood normal.     (all labs ordered are listed, but only abnormal results are displayed) Labs Reviewed  CBC - Abnormal; Notable for the following components:      Result Value   RBC 2.60 (*)    Hemoglobin 8.6 (*)    HCT 25.0 (*)    Platelets 104 (*)    nRBC 0.4 (*)    All other components within normal limits  URINALYSIS, ROUTINE W REFLEX MICROSCOPIC - Abnormal; Notable for the following components:   Color, Urine STRAW (*)    Specific Gravity, Urine 1.004 (*)    Leukocytes,Ua MODERATE (*)    Bacteria, UA RARE (*)    All other components  within normal limits  BRAIN NATRIURETIC PEPTIDE - Abnormal; Notable for the following components:   B Natriuretic Peptide 160.5 (*)    All other components within normal limits  COMPREHENSIVE METABOLIC PANEL WITH GFR  VITAMIN B12  FOLATE  IRON  AND TIBC  FERRITIN  RETICULOCYTES  POC OCCULT BLOOD, ED  TYPE AND SCREEN  ABO/RH  TROPONIN I (HIGH SENSITIVITY)  TROPONIN I (HIGH SENSITIVITY)    EKG: EKG Interpretation Date/Time:  Wednesday February 21 2024 16:05:06 EDT Ventricular Rate:  63 PR Interval:  201 QRS Duration:  143 QT Interval:  439 QTC Calculation: 450 R Axis:   146  Text Interpretation: paced no sig chnage from previous Confirmed by Armenta Canning [phone]) on 02/21/2024 5:21:41 PM  Radiology: [facility] Chest Port 1 View Result Date: 02/21/2024 CLINICAL DATA:  Shortness of breath. EXAM: PORTABLE CHEST 1 VIEW COMPARISON:  12/28/2023. FINDINGS: Low lung volume. Bilateral lung fields are clear. Bilateral costophrenic angles are clear. Normal cardio-mediastinal silhouette. There is a left sided 3-lead pacemaker. No acute osseous abnormalities. Old healed right posterior fourth through sixth rib fractures noted. The soft tissues are within normal limits. IMPRESSION: No active disease. Electronically Signed   By: Ree Molt M.D.   On: 02/21/2024 17:37     Procedures   Medications Ordered in the ED - No data to display                                  Medical Decision Making Amount and/or Complexity of Data Reviewed Labs: ordered. Radiology: ordered.  Risk OTC drugs.   Patient presents as outlined with multifocal symptoms including generalized weakness, mild exertional dyspnea, lower extremity edema, anemia identified from  labs by PCP.  At this time we will proceed with diagnostic evaluation and orthostatic blood pressures.  Blood pressures are stable.  Patient is not exhibiting symptomatic anemia.  He is normotensive and heart rate is in the 50s and 60s.  At this time no  indication for emergent blood transfusion.  Occult stool is negative.  Urinalysis negative for signs of infection.  No blood in the urine.  Metabolic panel normal with normal GFR.  Troponin 15 BNP 160 chest x-ray grossly clear without acute findings per radiology review.  EKG interpreted by myself no acute changes.  At this time, patient is stable with anemia.  This appears more likely to have been gradual in nature.  Will start the patient on iron  supplement and obtain anemia panel with recommendation for follow-up with hematology and PCP.  Patient's family feels much more comfortable taking the patient home for continued outpatient evaluation and treatment.       Final diagnoses:  Anemia, unspecified type    ED Discharge Orders          Ordered    Iron , Ferrous Sulfate , 325 (65 Fe) MG TABS  2 times daily        02/21/24 1835               Armenta Canning, MD 02/21/24 [phone]

## 2024-02-21 NOTE — ED Triage Notes (Signed)
 Pt here from home sent from MD office for abd pain and lower hbg , had labs drawn Monday ,

## 2024-02-21 NOTE — ED Notes (Signed)
 Lab made aware of add-ons.  Sts they are backed up, but will run them asap.

## 2024-02-21 NOTE — ED Notes (Signed)
 Pt and family was given discharge instructions. Daughter and wife verbalized understanding of instructions and daughter said she is already linked to his my chart. Opportunity to ask questions was given. Pt placed in wheelchair on discharge.

## 2024-02-22 ENCOUNTER — Encounter: Payer: Self-pay | Admitting: Internal Medicine

## 2024-02-22 DIAGNOSIS — D6182 Myelophthisis: Secondary | ICD-10-CM | POA: Insufficient documentation

## 2024-02-22 DIAGNOSIS — D649 Anemia, unspecified: Secondary | ICD-10-CM | POA: Insufficient documentation

## 2024-02-22 NOTE — Assessment & Plan Note (Signed)
 Acute low reticulocytes and normal iron , B12, folate, and haptoglobin Hemocult negative GI bleed unlikely ? Aplastic anemia, Myelodysplastic syndromes (MDS) Referral to hem/onc for further evaluation

## 2024-02-22 NOTE — Patient Instructions (Incomplete)
    Medications changes include :   None    A referral was ordered for hematology/oncology - [phone]  and someone will call you to schedule an appointment.

## 2024-02-22 NOTE — Progress Notes (Unsigned)
 Subjective:    Patient ID: Jeff Weaver, male    DOB: [birth year], 78 y.o.   MRN: [id number]     HPI Jeff Weaver is here for follow up after ED visit.   Labs reviewed.   Hgb 14.4 ( 4/9) - > 9.3 (8/15) -> 8.5 (8/18) -> 8.6 (8/20)  Iron  levels normal.  Haptoglobin normal.  Folate normal. B12 level normal.  Decreased reticulocytes.   No new concerning symptoms  Medications and allergies reviewed with patient and updated if appropriate.  Current Outpatient Medications on File Prior to Visit  Medication Sig Dispense Refill   acetaminophen  (TYLENOL ) 325 MG tablet Take 325-650 mg by mouth every 6 (six) hours as needed (pain/headaches.).     AMOXICILLIN  PO Take by mouth daily.     benzonatate  (TESSALON ) 100 MG capsule Take 1 capsule (100 mg total) by mouth 3 (three) times daily as needed for cough. 30 capsule 0   carbidopa-levodopa (SINEMET IR) 25-100 MG tablet as directed.     cetirizine  (ZYRTEC ) 10 MG tablet Take 1 tablet (10 mg total) by mouth daily. 30 tablet 11   Cholecalciferol  (VITAMIN D ) 50 MCG (2000 UT) CAPS Take 2,000 Units by mouth in the morning.     clonazePAM (KLONOPIN) 1 MG disintegrating tablet Take 1 mg by mouth as directed. ALLOW 1 TABLET TO DISSOLVE IN CHEEK FOR SEIZURES LASTING LONGER THAN 4 MINUTES. OR AFTER 2ND SEIZURE IN 24 HOURS PRN     clotrimazole  (LOTRIMIN ) 1 % cream Apply 1 Application topically 2 (two) times daily. 113 g 2   donepezil  (ARICEPT ) 10 MG tablet Take half a tablet in the morning for a month and then increase to 1 full tablet daily. 30 tablet 5   finasteride (PROSCAR) 5 MG tablet Take 5 mg by mouth daily.     ipratropium (ATROVENT ) 0.06 % nasal spray Place 2 sprays into the nose as needed for rhinitis. 15 mL 5   Iron , Ferrous Sulfate , 325 (65 Fe) MG TABS Take 1 tablet by mouth 2 (two) times daily. 60 tablet 0   ketoconazole  (NIZORAL ) 2 % cream Apply 1 Application topically daily as needed (for chest and axilla rash). 30 g 0   levETIRAcetam  (KEPPRA )  500 MG tablet Take 3 tablets by mouth in the morning and at bedtime.     loperamide  (IMODIUM ) 2 MG capsule Take 1 capsule (2 mg total) by mouth as needed for diarrhea or loose stools. 30 capsule 5   LORazepam  (ATIVAN ) 1 MG tablet Take 1 mg by mouth every 6 (six) hours as needed for seizure.      Midazolam  (NAYZILAM ) 5 MG/0.1ML SOLN Place 1 each into the nose as needed (seizure).     midodrine  (PROAMATINE ) 2.5 MG tablet Take 1 tablet (2.5 mg total) by mouth every morning. 90 tablet 3   mirabegron  ER (MYRBETRIQ ) 50 MG TB24 tablet Take 50 mg by mouth daily.     mupirocin  ointment (BACTROBAN ) 2 % Apply 1 Application topically daily. For wound on left arm 30 g 2   nitroGLYCERIN  (NITROSTAT ) 0.4 MG SL tablet PLACE 1 TABLET (0.4 MG TOTAL) UNDER THE TONGUE EVERY 5 (FIVE) MINUTES AS NEEDED FOR CHEST PAIN. 25 tablet 11   nystatin  ointment (MYCOSTATIN ) Apply 1 Application topically 2 (two) times daily. 60 g 3   Propylene Glycol (SYSTANE BALANCE) 0.6 % SOLN Apply 1 drop to eye daily as needed (dry eyes).     pyridOXINE  (VITAMIN B-6) 100 MG tablet Take 100 mg  by mouth in the morning.     rosuvastatin  (CRESTOR ) 5 MG tablet Take 1 tablet (5 mg total) by mouth 3 (three) times a week. 45 tablet 3   Tamsulosin  HCl (FLOMAX ) 0.4 MG CAPS Take 0.4 mg by mouth every evening.     thiamine  100 MG tablet Take 100 mg by mouth in the morning.     torsemide  (DEMADEX ) 20 MG tablet TAKE 1 TABLET (20 MG) BY MOUTH ALTERNATING WITH 1/2 TABLET (10 MG) BY MOUTH EVERY OTHER DAY. 75 tablet 3   triamcinolone  cream (KENALOG ) 0.5 % Apply 1 Application topically as needed (break out prevention).     VIMPAT  200 MG TABS tablet Take 200 mg by mouth 2 (two) times daily.     vitamin B-12 (CYANOCOBALAMIN ) 1000 MCG tablet Take 1,000 mcg by mouth in the morning.     No current facility-administered medications on file prior to visit.     Review of Systems  Constitutional:  Positive for fatigue.  Respiratory:  Positive for cough (sometimes)  and shortness of breath. Negative for wheezing.   Cardiovascular:  Positive for chest pain (intermittent - chronic).  Gastrointestinal:  Positive for abdominal pain (sometimes) and constipation. Negative for blood in stool (no melena).  Genitourinary:  Negative for hematuria.  Neurological:  Positive for dizziness, light-headedness and headaches.       Objective:   Vitals:   02/23/24 0911  BP: (!) 150/80  Pulse: (!) 59  Temp: 97.9 F (36.6 C)  SpO2: 98%   BP Readings from Last 3 Encounters:  02/23/24 (!) 150/80  02/21/24 (!) 129/59  02/16/24 104/68   Wt Readings from Last 3 Encounters:  02/23/24 238 lb (108 kg)  02/21/24 244 lb (110.7 kg)  12/14/23 240 lb (108.9 kg)   Body mass index is 38.41 kg/m.    Physical Exam Constitutional:      General: He is not in acute distress.    Appearance: Normal appearance. He is not ill-appearing.  HENT:     Head: Normocephalic and atraumatic.  Skin:    General: Skin is warm and dry.  Neurological:     Mental Status: He is alert. Mental status is at baseline.  Psychiatric:        Mood and Affect: Mood normal.        Behavior: Behavior normal.        Thought Content: Thought content normal.        Judgment: Judgment normal.        Lab Results  Component Value Date   WBC 5.3 02/21/2024   HGB 8.6 (L) 02/21/2024   HCT 25.0 (L) 02/21/2024   PLT 104 (L) 02/21/2024   GLUCOSE 97 02/21/2024   CHOL 125 03/24/2023   TRIG 94 03/24/2023   HDL 44 03/24/2023   LDLDIRECT 87.6 09/19/2012   LDLCALC 63 03/24/2023   ALT 9 02/21/2024   AST 23 02/21/2024   NA 136 02/21/2024   K 3.8 02/21/2024   CL 98 02/21/2024   CREATININE 0.96 02/21/2024   BUN 15 02/21/2024   CO2 29 02/21/2024   TSH 1.77 01/12/2023   INR 1.0 07/17/2017   HGBA1C 5.9 03/21/2022     Assessment & Plan:    See Problem List for Assessment and Plan of chronic medical problems.

## 2024-02-23 ENCOUNTER — Ambulatory Visit: Payer: Medicare Other

## 2024-02-23 ENCOUNTER — Ambulatory Visit: Payer: Self-pay | Admitting: Cardiology

## 2024-02-23 ENCOUNTER — Ambulatory Visit (INDEPENDENT_AMBULATORY_CARE_PROVIDER_SITE_OTHER): Admitting: Internal Medicine

## 2024-02-23 VITALS — BP 150/80 | HR 59 | Temp 97.9°F | Ht 66.0 in | Wt 238.0 lb

## 2024-02-23 DIAGNOSIS — D649 Anemia, unspecified: Secondary | ICD-10-CM

## 2024-02-23 DIAGNOSIS — I428 Other cardiomyopathies: Secondary | ICD-10-CM

## 2024-02-23 LAB — CUP PACEART REMOTE DEVICE CHECK
Battery Remaining Longevity: 89 mo
Battery Voltage: 2.98 V
Brady Statistic AP VP Percent: 62.93 %
Brady Statistic AP VS Percent: 0.01 %
Brady Statistic AS VP Percent: 37.04 %
Brady Statistic AS VS Percent: 0.02 %
Brady Statistic RA Percent Paced: 62.89 %
Brady Statistic RV Percent Paced: 99.97 %
Date Time Interrogation Session: 20250822045751
Implantable Lead Connection Status: 753985
Implantable Lead Connection Status: 753985
Implantable Lead Connection Status: 753985
Implantable Lead Implant Date: 20191230
Implantable Lead Implant Date: 20191230
Implantable Lead Implant Date: 20220907
Implantable Lead Location: 753858
Implantable Lead Location: 753859
Implantable Lead Location: 753860
Implantable Lead Model: 4598
Implantable Lead Model: 5076
Implantable Lead Model: 5076
Implantable Pulse Generator Implant Date: 20220907
Lead Channel Impedance Value: 285 Ohm
Lead Channel Impedance Value: 304 Ohm
Lead Channel Impedance Value: 342 Ohm
Lead Channel Impedance Value: 361 Ohm
Lead Channel Impedance Value: 361 Ohm
Lead Channel Impedance Value: 380 Ohm
Lead Channel Impedance Value: 399 Ohm
Lead Channel Impedance Value: 437 Ohm
Lead Channel Impedance Value: 532 Ohm
Lead Channel Impedance Value: 551 Ohm
Lead Channel Impedance Value: 570 Ohm
Lead Channel Impedance Value: 627 Ohm
Lead Channel Impedance Value: 665 Ohm
Lead Channel Impedance Value: 665 Ohm
Lead Channel Pacing Threshold Amplitude: 0.625 V
Lead Channel Pacing Threshold Amplitude: 0.875 V
Lead Channel Pacing Threshold Amplitude: 1.25 V
Lead Channel Pacing Threshold Pulse Width: 0.4 ms
Lead Channel Pacing Threshold Pulse Width: 0.4 ms
Lead Channel Pacing Threshold Pulse Width: 0.4 ms
Lead Channel Sensing Intrinsic Amplitude: 1.375 mV
Lead Channel Sensing Intrinsic Amplitude: 1.375 mV
Lead Channel Sensing Intrinsic Amplitude: 15.75 mV
Lead Channel Sensing Intrinsic Amplitude: 15.75 mV
Lead Channel Setting Pacing Amplitude: 1.5 V
Lead Channel Setting Pacing Amplitude: 1.5 V
Lead Channel Setting Pacing Amplitude: 2.5 V
Lead Channel Setting Pacing Pulse Width: 0.4 ms
Lead Channel Setting Pacing Pulse Width: 0.4 ms
Lead Channel Setting Sensing Sensitivity: 1.2 mV
Zone Setting Status: 755011
Zone Setting Status: 755011

## 2024-02-28 ENCOUNTER — Ambulatory Visit (INDEPENDENT_AMBULATORY_CARE_PROVIDER_SITE_OTHER): Admitting: Internal Medicine

## 2024-02-28 ENCOUNTER — Encounter: Payer: Self-pay | Admitting: Internal Medicine

## 2024-02-28 VITALS — BP 128/66 | HR 61 | Ht 66.0 in | Wt 238.0 lb

## 2024-02-28 DIAGNOSIS — R198 Other specified symptoms and signs involving the digestive system and abdomen: Secondary | ICD-10-CM | POA: Diagnosis not present

## 2024-02-28 DIAGNOSIS — D649 Anemia, unspecified: Secondary | ICD-10-CM

## 2024-02-28 NOTE — Patient Instructions (Signed)
 Please purchase the following medications over the counter and take as directed:Benefiber or Metamucil daily.   Discontinue oral iron  supplement.   Please follow up with Hematology.  _______________________________________________________  If your blood pressure at your visit was 140/90 or greater, please contact your primary care physician to follow up on this.  _______________________________________________________  If you are age 80 or older, your body mass index should be between 23-30. Your Body mass index is 38.41 kg/m. If this is out of the aforementioned range listed, please consider follow up with your Primary Care Provider.  If you are age 13 or younger, your body mass index should be between 19-25. Your Body mass index is 38.41 kg/m. If this is out of the aformentioned range listed, please consider follow up with your Primary Care Provider.   ________________________________________________________  The [facility] providers would like to encourage you to use MYCHART to communicate with providers for non-urgent requests or questions.  Due to long hold times on the telephone, sending your provider a message by [facility] may be a faster and more efficient way to get a response.  Please allow 48 business hours for a response.  Please remember that this is for non-urgent requests.  _______________________________________________________  [facility] is using a team-based approach to care.  Your team is made up of your doctor and two to three APPS. Our APPS (Nurse Practitioners and Physician Assistants) work with your physician to ensure care continuity for you. They are fully qualified to address your health concerns and develop a treatment plan. They communicate directly with your gastroenterologist to care for you. Seeing the Advanced Practice Practitioners on your physician's team can help you by facilitating care more promptly, often allowing for earlier appointments,  access to diagnostic testing, procedures, and other specialty referrals.

## 2024-02-28 NOTE — Progress Notes (Signed)
 Subjective:    Patient ID: Jeff Weaver, male    DOB: 12-21-1945, 78 y.o.   MRN: [id number]  HPI Jeff Weaver is a 78 year old male with remote Crohn's disease, history of adenomatous polyps, B12 deficiency, GERD who presents with worsening anemia. He was referred by Dr. Geofm for further evaluation of his anemia.  He is experiencing worsening anemia with a significant drop in hemoglobin levels over the past few months. His hemoglobin was 14.4 on October 11, 2023, and decreased to 9.3 on February 16, 2024. By February 19, 2024, it further dropped to 8.5 with an MCV of 92.6. His absolute reticulocyte count was low at 10,400, and haptoglobin was normal. Ferritin was 216, iron  saturation was 69, TIBC was normal, and iron  was slightly elevated at 214. B12 levels were normal at 845. A CBC on February 21, 2024, showed hemoglobin at 8.6. He was seen in the emergency department on February 21, 2024, for generalized weakness and a decrease in hemoglobin.  He last had an EGD and colonoscopy on February 25, 2020. The EGD revealed a normal esophagus, two subcentimeter polyps in the distal gastric body, one with a shallow ulcer at the tip, which were removed. Pathology showed hyperplastic polyps without dysplasia and reactive gastropathy without H. Pylori. The colonoscopy revealed four subcentimeter adenomas, diverticulosis in the left colon, and internal hemorrhoids, with no evidence of IBD.  He experiences generalized weakness and fatigue, stating 'trying to get up in the morning is a pain.' He has soreness, particularly in the knees, and difficulty getting out of a car or chair. He also notes visual disturbances, describing 'little squares' moving on the floor when he stands up, which may be related to his Lewy body dementia.  He has not had a recent bowel movement accident but notes variability in stool consistency, ranging from loose to very hard and large. He inquires about using fiber supplements to regulate bowel  movements.  No visible blood in stool or melena.  Stools heme-negative in PCP office.  Review of Systems As per HPI, otherwise negative  Current Medications, Allergies, Past Medical History, Past Surgical History, Family History and Social History were reviewed in [employer] record.    Objective:   Physical Exam BP 128/66   Pulse 61   Ht 5' 6 (1.676 m)   Wt 238 lb (108 kg)   BMI 38.41 kg/m  Gen: awake, alert, NAD HEENT: anicteric  Ext: no c/c/e Neuro: nonfocal  LABS Hb: 14.4 (10/11/2023) Hb: 9.3 (02/16/2024) Hb: 8.5 (02/19/2024) MCV: 92.6 (02/19/2024) Absolute reticulocyte count: 10400 (02/19/2024) Haptoglobin: Normal (02/19/2024) Ferritin: 216 (02/19/2024) Iron  saturation: 69% (02/19/2024) TIBC: Normal (02/19/2024) Iron : 214 (02/19/2024) B12: 845 (02/19/2024) Hb: 8.6 (02/21/2024)  RADIOLOGY CT chest, abdomen, pelvis with contrast: No acute traumatic injury within chest, abdomen, or pelvis; 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 pylori, normal duodenum (02/25/2020) Colonoscopy: Four subcentimeter adenomas, diverticulosis in left colon, internal hemorrhoids, no evidence of IBD (02/25/2020)      Assessment & Plan:   Acute normocytic anemia Acute hemoglobin drop from 14.4 to 8.5. Normocytic anemia with normal iron , B12, and folate levels. No GI bleed or hemolysis, indicating red blood cell production issue. - Expedite hematology referral for further evaluation and potential bone marrow biopsy. - Avoid  iron  supplementation as iron  levels are adequate.  Constipation and irregular bowel movements Alternating loose and hard stools indicate irregular bowel habits. - Initiate  fiber supplementation with Benefiber, Citrucel, or Metamucil to regulate bowel movements.  History of Crohn's disease No evidence that this is an active issue for him after previous evaluation and imaging  30 minutes total spent today including patient facing time, coordination of care, reviewing medical history/procedures/pertinent radiology studies, and documentation of the encounter.

## 2024-02-29 NOTE — Progress Notes (Unsigned)
 [facility] CONSULT NOTE  Patient Care Team: Geofm Glade PARAS, MD as PCP - General (Internal Medicine) Inocencio Soyla Lunger, MD as PCP - Electrophysiology (Cardiology) Shlomo Wilbert SAUNDERS, MD as PCP - Cardiology (Cardiology) Conner, Burnard SAUNDERS, PA-C (Neurology) Sam, Hadassah BROCKS, MD as Referring Physician (Neurology) Narvis Larger, MD as Physician Assistant (Urology) Therman Darice HERO, CCC-SLP as Speech Language Pathologist (Speech Pathology) Robinson Mayo, OD as Referring Physician (Optometry)  ASSESSMENT & PLAN 78 y.o.male with history of cerebral aneurysm s/p clipping in 02/2012 c/b memory loss, cerebral hemorrhage and seizure disorder s/p VP shunt, heart failure, CAD, hypertension, 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  Labs showed thrombocytopenia, anemia with nRBC. Ferritin, b12, folate were within normal range.  LFT and bilirubin were normal.  Anemia and was cytopenia is persistent on repeat testing.  Discussed differential diagnosis today.  Given the above finding, and unclear etiology, recommend obtain BM biopsy.  Assessment & Plan Normocytic anemia Repeat labs with ABO/Rh LDH, PT/APTT Copper  Recommend bone marrow biopsy And marrow biopsy ordered.  Family requested light sedation. Thrombocytopenia (HCC) Comment bone marrow biopsy  Patient will return in 2 to 3 weeks after biopsy to discuss results.  Orders Placed This Encounter  Procedures   CT BONE MARROW BIOPSY & ASPIRATION    Standing Status:   Future    Expected Date:   03/12/2024    Expiration Date:   03/05/2025    Scheduling Instructions:     Please call daughter or wife.     Requested light sedation due to history of dementia    Reason for Exam (SYMPTOM  OR DIAGNOSIS REQUIRED):   Persistent anemia with thrombocytopenia, nucleated red cells concerning for hematologic  malignancy    Preferred imaging location?:   [facility]    Radiology Contrast Protocol - do NOT remove file path:   \\[email or web address].pdf   CBC with Differential (Cancer Center Only)    Standing Status:   Future    Expiration Date:   03/05/2025   Copper , serum    Standing Status:   Future    Expiration Date:   03/05/2025   APTT    Standing Status:   Future    Expiration Date:   03/05/2025   Protime-INR    Standing Status:   Future    Expiration Date:   03/05/2025   Lactate dehydrogenase    Standing Status:   Future    Expiration Date:   03/05/2025   Sample to Blood Bank    Standing Status:   Future    Expiration Date:   03/05/2025   ABO/RH    Standing Status:   Future    Expiration Date:   03/05/2025   All questions were answered. The patient knows to call the clinic with any problems, questions or concerns. No barriers to learning was detected.   Pauletta BROCKS Chihuahua, MD 03/05/2024 10:14 AM  CHIEF COMPLAINTS/PURPOSE OF CONSULTATION:  Anemia and thrombocytopenia  HISTORY OF PRESENTING ILLNESS:  Jeff Weaver 78 y.o. male is here because of thrombocytopenia.  Record showed patient had normal hemoglobin in April 2025 and his platelet level has been at about low normal range.  In August, his hemoglobin was 9.3.  In April hemoglobin was 14.  MCV was 93.  In August, platelet dropped to 126.  He presented to the ED on August 20.  Repeat lab again on August 18 August 20 show persistent anemia and thrombocytopenia.  Hemoglobin was 8.6 and platelet was 104.  Reported nucleated red blood cell.  His bilirubin, LFT and renal function was normal.  B12, ferritin and folate levels were normal.  Recent occult blood test was normal.  Given he was asymptomatic, he was discharged home from ED for and requested outpatient referral and follow-up.  History reported chronic combined heart failure and suspected nonischemic cardiomyopathy, bradycardia with AV block status post pacemaker  placement.  He also has moderate AI and mild MR.  Reported history of cerebral aneurysm post clipping at [facility] complicated by memory loss, cerebral hemorrhage and seizure status post VP shunt.  He also had a SDH due to fall in 2023.  Report of Lewy body dementia as well.   MEDICAL HISTORY:  Past Medical History:  Diagnosis Date   Aortic insufficiency    Atypical chest pain    Cardiac resynchronization therapy pacemaker (CRT-P) in place    Cerebral aneurysm    a. s/p clipping at [facility] in 8/13 c/b short-term memory loss, cerebral hemorrhage and seizure disorder s/p VP shunt.   Cerebral hemorrhage (HCC)    Chronic combined systolic and diastolic CHF (congestive heart failure) (HCC)    CVA (cerebral infarction)    Diverticulosis    Enteritis (regional)    Dr Obie   First degree AV block    GI bleed    HTN (hypertension)    Hyperlipidemia    Hypertensive heart disease    IBS (irritable bowel syndrome)    Lewy body dementia (HCC)    Mild CAD    Cardiac cath 01/12/18 showed minimal nonobstructive CAD, normal LVEF, normal LVEDP.   Mild dilation of ascending aorta (HCC)    Mitral regurgitation    NSVT (nonsustained ventricular tachycardia) (HCC)    Orthostatic hypotension    Pacemaker    Pre-diabetes    PVC's (premature ventricular contractions)    Regional enteritis of large intestine (HCC) since 1978   Rheumatoid arthritis(714.0)    dxed in [employer] in 1980s   Second degree AV block, Mobitz type I    Seizures (HCC)    last April 26,2021   Sinus bradycardia    Sleep apnea    SSS (sick sinus syndrome) (HCC)    Subdural hematoma (HCC)     SURGICAL HISTORY: Past Surgical History:  Procedure Laterality Date   BIV PACEMAKER INSERTION CRT-P N/A 03/10/2021   Procedure: BIV PACEMAKER INSERTION CRT-P UPGRADE;  Surgeon: Inocencio Soyla Lunger, MD;  Location: [facility] INVASIVE CV LAB;  Service: Cardiovascular;  Laterality: N/A;   CHOLECYSTECTOMY N/A 01/07/2016   Procedure: LAPAROSCOPIC  CHOLECYSTECTOMY ;  Surgeon: Vicenta Poli, MD;  Location: [facility] OR;  Service: General;  Laterality: N/A;   cns shunt  02/23/12   COLONOSCOPY W/ POLYPECTOMY  1978   negative 2009, Dr Obie. Due 2014   CRANIOTOMY  02/02/12   Dr Rosslyn, [facility]-clipping of aneurysm   CRANIOTOMY  02-02-12   left pterional craniotomy for clipping complex anterior communicating artery aneurysm    HERNIA REPAIR     LEFT HEART CATH AND CORONARY ANGIOGRAPHY N/A 01/12/2018   Procedure: LEFT HEART CATH AND CORONARY ANGIOGRAPHY;  Surgeon: Swaziland, Peter M, MD;  Location: [facility] INVASIVE CV LAB;  Service: Cardiovascular;  Laterality: N/A;   LEFT HEART CATHETERIZATION WITH CORONARY ANGIOGRAM N/A 11/26/2013   Procedure: LEFT HEART CATHETERIZATION WITH CORONARY ANGIOGRAM;  Surgeon: Alm LELON Clay, MD;  Location: [facility] CATH LAB;  Service: Cardiovascular;  Laterality: N/A;   NOSE SURGERY     PACEMAKER IMPLANT N/A 07/02/2018   Procedure: PACEMAKER IMPLANT;  Surgeon: Inocencio Soyla Lunger, MD;  Location: [facility] INVASIVE CV LAB;  Service: Cardiovascular;  Laterality: N/A;   SHOULDER SURGERY  1997   TONSILLECTOMY AND ADENOIDECTOMY     VENTRICULOPERITONEAL SHUNT  02-23-12   INSERTION OF RIGHT FRONTAL VENTRICULOPERITONEAL SHUNT WITH A CODMAN HAKIM PROGRAMMABLE VALVE    SOCIAL HISTORY: Social History   Socioeconomic History   Marital status: Married    Spouse name: [name]   Number of children: 1   Years of education: Not on file   Highest education level: Not on file  Occupational History   Occupation: retired/ Heritage manager  Tobacco Use   Smoking status: Former    Current packs/day: 0.00    Types: Pipe, Cigarettes    Quit date: 07/04/1978    Years since quitting: 45.7   Smokeless tobacco: Former  Building services engineer status: Never Used  Substance and Sexual Activity   Alcohol use: Not Currently    Comment: Very Infrequently    Drug use: No   Sexual activity: Not Currently  Other Topics Concern   Not on file  Social History Narrative    Lives with wife.   Social Drivers of Corporate investment banker Strain: Low Risk  (08/04/2023)   Overall Financial Resource Strain (CARDIA)    Difficulty of Paying Living Expenses: Not hard at all  Food Insecurity: No Food Insecurity (03/05/2024)   Hunger Vital Sign    Worried About Running Out of Food in the Last Year: Never true    Ran Out of Food in the Last Year: Never true  Transportation Needs: No Transportation Needs (03/05/2024)   PRAPARE - Administrator, Civil Service (Medical): No    Lack of Transportation (Non-Medical): No  Physical Activity: Inactive (08/04/2023)   Exercise Vital Sign    Days of Exercise per Week: 0 days    Minutes of Exercise per Session: 0 min  Stress: No Stress Concern Present (08/04/2023)   [employer] of Occupational Health - Occupational Stress Questionnaire    Feeling of Stress : Only a little  Social Connections: Socially Isolated (08/04/2023)   Social Connection and Isolation Panel    Frequency of Communication with Friends and Family: Never    Frequency of Social Gatherings with Friends and Family: Never    Attends Religious Services: Never    Database administrator or Organizations: No    Attends Banker Meetings: Never    Marital Status: Married  Catering manager Violence: Not At Risk (03/05/2024)   Humiliation, Afraid, Rape, and Kick questionnaire    Fear of Current or Ex-Partner: No    Emotionally Abused: No    Physically Abused: No    Sexually Abused: No    FAMILY HISTORY: Family History  Problem Relation Age of Onset   COPD Father    Coronary artery disease Father        MI in 29s   Hepatitis Mother    Diabetes Sister    Dementia Sister 3   Diabetes Brother    Colon cancer Neg Hx    Esophageal cancer Neg Hx    Rectal cancer Neg Hx    Stomach cancer Neg Hx    Colon polyps Neg Hx     ALLERGIES:  is allergic to haloperidol, rosuvastatin , simvastatin, lisinopril, nifedipine , and  pravastatin .  MEDICATIONS:  Current  Outpatient Medications  Medication Sig Dispense Refill   acetaminophen  (TYLENOL ) 325 MG tablet Take 325-650 mg by mouth every 6 (six) hours as needed (pain/headaches.).     benzonatate  (TESSALON ) 100 MG capsule Take 1 capsule (100 mg total) by mouth 3 (three) times daily as needed for cough. 30 capsule 0   carbidopa-levodopa (SINEMET IR) 25-100 MG tablet as directed.     cetirizine  (ZYRTEC ) 10 MG tablet Take 1 tablet (10 mg total) by mouth daily. 30 tablet 11   Cholecalciferol  (VITAMIN D ) 50 MCG (2000 UT) CAPS Take 2,000 Units by mouth in the morning.     clonazePAM (KLONOPIN) 1 MG disintegrating tablet Take 1 mg by mouth as directed. ALLOW 1 TABLET TO DISSOLVE IN CHEEK FOR SEIZURES LASTING LONGER THAN 4 MINUTES. OR AFTER 2ND SEIZURE IN 24 HOURS PRN     clotrimazole  (LOTRIMIN ) 1 % cream Apply 1 Application topically 2 (two) times daily. 113 g 2   donepezil  (ARICEPT ) 10 MG tablet Take half a tablet in the morning for a month and then increase to 1 full tablet daily. 30 tablet 5   finasteride (PROSCAR) 5 MG tablet Take 5 mg by mouth daily.     ipratropium (ATROVENT ) 0.06 % nasal spray Place 2 sprays into the nose as needed for rhinitis. 15 mL 5   ketoconazole  (NIZORAL ) 2 % cream Apply 1 Application topically daily as needed (for chest and axilla rash). 30 g 0   levETIRAcetam  (KEPPRA ) 500 MG tablet Take 3 tablets by mouth in the morning and at bedtime.     loperamide  (IMODIUM ) 2 MG capsule Take 1 capsule (2 mg total) by mouth as needed for diarrhea or loose stools. 30 capsule 5   LORazepam  (ATIVAN ) 1 MG tablet Take 1 mg by mouth every 6 (six) hours as needed for seizure.      midodrine  (PROAMATINE ) 2.5 MG tablet Take 1 tablet (2.5 mg total) by mouth every morning. 90 tablet 3   mirabegron  ER (MYRBETRIQ ) 50 MG TB24 tablet Take 50 mg by mouth daily.     mupirocin  ointment (BACTROBAN ) 2 % Apply 1 Application topically daily. For wound on left arm 30 g 2    nitroGLYCERIN  (NITROSTAT ) 0.4 MG SL tablet PLACE 1 TABLET (0.4 MG TOTAL) UNDER THE TONGUE EVERY 5 (FIVE) MINUTES AS NEEDED FOR CHEST PAIN. 25 tablet 11   nystatin  ointment (MYCOSTATIN ) Apply 1 Application topically 2 (two) times daily. 60 g 3   Propylene Glycol (SYSTANE BALANCE) 0.6 % SOLN Apply 1 drop to eye daily as needed (dry eyes).     pyridOXINE  (VITAMIN B-6) 100 MG tablet Take 100 mg by mouth in the morning.     rosuvastatin  (CRESTOR ) 5 MG tablet Take 1 tablet (5 mg total) by mouth 3 (three) times a week. 45 tablet 3   Tamsulosin  HCl (FLOMAX ) 0.4 MG CAPS Take 0.4 mg by mouth every evening.     thiamine  100 MG tablet Take 100 mg by mouth in the morning.     torsemide  (DEMADEX ) 20 MG tablet TAKE 1 TABLET (20 MG) BY MOUTH ALTERNATING WITH 1/2 TABLET (10 MG) BY MOUTH EVERY OTHER DAY. 75 tablet 3   triamcinolone  cream (KENALOG ) 0.5 % Apply 1 Application topically as needed (break out prevention).     VIMPAT  200 MG TABS tablet Take 200 mg by mouth 2 (two) times daily.     vitamin B-12 (CYANOCOBALAMIN ) 1000 MCG tablet Take 1,000 mcg by mouth in the morning.     AMOXICILLIN  PO  Take by mouth daily.     Iron , Ferrous Sulfate , 325 (65 Fe) MG TABS Take 1 tablet by mouth 2 (two) times daily. 60 tablet 0   Midazolam  (NAYZILAM ) 5 MG/0.1ML SOLN Place 1 each into the nose as needed (seizure).     No current facility-administered medications for this visit.    REVIEW OF SYSTEMS:   All relevant systems were reviewed with the patient and are negative.  PHYSICAL EXAMINATION: ECOG PERFORMANCE STATUS: 2 - Symptomatic, <50% confined to bed  Vitals:   03/05/24 0916  BP: (!) 126/51  Pulse: 69  Resp: 18  Temp: 98.4 F (36.9 C)  SpO2: 98%   Filed Weights   03/05/24 0916  Weight: 234 lb 11.2 oz (106.5 kg)    GENERAL: alert, no distress and comfortable SKIN: skin color normal and a few bruising over upper extremity, no petechiae on exposed skin EYES: Pale conjunctiva, sclera clear NECK: No palpable  mass LYMPH:  no palpable cervical, axillary lymphadenopathy  LUNGS: clear to auscultation with normal breathing effort HEART: regular rate & rhythm and no murmurs  ABDOMEN: abdomen soft, non-tender and nondistended. Musculoskeletal: Bilateral ankle edema   LABORATORY DATA:  I have reviewed the data as listed  RADIOGRAPHIC STUDIES: I have personally reviewed the radiological images as listed and agreed with the findings in the report. CUP PACEART REMOTE DEVICE CHECK Result Date: 02/23/2024 PPM Scheduled remote reviewed. Normal device function.  Presenting rhythm:  AP/BiV pace, noisy atrial channel on presenting, differs from previous, ? dysynchrony - Route to triage HF diagnostics currently abnormal Next remote 91 days. LA, CVRS  DG Chest Port 1 View Result Date: 02/21/2024 CLINICAL DATA:  Shortness of breath. EXAM: PORTABLE CHEST 1 VIEW COMPARISON:  12/28/2023. FINDINGS: Low lung volume. Bilateral lung fields are clear. Bilateral costophrenic angles are clear. Normal cardio-mediastinal silhouette. There is a left sided 3-lead pacemaker. No acute osseous abnormalities. Old healed right posterior fourth through sixth rib fractures noted. The soft tissues are within normal limits. IMPRESSION: No active disease. Electronically Signed   By: Ree Molt M.D.   On: 02/21/2024 17:37

## 2024-03-05 ENCOUNTER — Inpatient Hospital Stay

## 2024-03-05 ENCOUNTER — Ambulatory Visit: Payer: Self-pay

## 2024-03-05 VITALS — BP 126/51 | HR 69 | Temp 98.4°F | Resp 18 | Wt 234.7 lb

## 2024-03-05 DIAGNOSIS — D696 Thrombocytopenia, unspecified: Secondary | ICD-10-CM | POA: Diagnosis not present

## 2024-03-05 DIAGNOSIS — Z79899 Other long term (current) drug therapy: Secondary | ICD-10-CM | POA: Insufficient documentation

## 2024-03-05 DIAGNOSIS — D649 Anemia, unspecified: Secondary | ICD-10-CM | POA: Diagnosis not present

## 2024-03-05 DIAGNOSIS — Z87891 Personal history of nicotine dependence: Secondary | ICD-10-CM | POA: Diagnosis not present

## 2024-03-05 DIAGNOSIS — G3183 Dementia with Lewy bodies: Secondary | ICD-10-CM | POA: Insufficient documentation

## 2024-03-05 DIAGNOSIS — F0280 Dementia in other diseases classified elsewhere without behavioral disturbance: Secondary | ICD-10-CM | POA: Diagnosis not present

## 2024-03-05 DIAGNOSIS — C9200 Acute myeloblastic leukemia, not having achieved remission: Secondary | ICD-10-CM | POA: Diagnosis present

## 2024-03-05 LAB — CBC WITH DIFFERENTIAL (CANCER CENTER ONLY)
Abs Immature Granulocytes: 0.03 K/uL (ref 0.00–0.07)
Basophils Absolute: 0 K/uL (ref 0.0–0.1)
Basophils Relative: 0 %
Eosinophils Absolute: 0.1 K/uL (ref 0.0–0.5)
Eosinophils Relative: 2 %
HCT: 21.7 % — ABNORMAL LOW (ref 39.0–52.0)
Hemoglobin: 7.7 g/dL — ABNORMAL LOW (ref 13.0–17.0)
Immature Granulocytes: 1 %
Lymphocytes Relative: 18 %
Lymphs Abs: 1 K/uL (ref 0.7–4.0)
MCH: 32.6 pg (ref 26.0–34.0)
MCHC: 35.5 g/dL (ref 30.0–36.0)
MCV: 91.9 fL (ref 80.0–100.0)
Monocytes Absolute: 0.5 K/uL (ref 0.1–1.0)
Monocytes Relative: 9 %
Neutro Abs: 3.9 K/uL (ref 1.7–7.7)
Neutrophils Relative %: 70 %
Platelet Count: 48 K/uL — ABNORMAL LOW (ref 150–400)
RBC: 2.36 MIL/uL — ABNORMAL LOW (ref 4.22–5.81)
RDW: 13.2 % (ref 11.5–15.5)
WBC Count: 5.4 K/uL (ref 4.0–10.5)
nRBC: 1.1 % — ABNORMAL HIGH (ref 0.0–0.2)

## 2024-03-05 LAB — APTT: aPTT: 28 s (ref 24–36)

## 2024-03-05 LAB — PROTIME-INR
INR: 1.2 (ref 0.8–1.2)
Prothrombin Time: 15.4 s — ABNORMAL HIGH (ref 11.4–15.2)

## 2024-03-05 LAB — LACTATE DEHYDROGENASE: LDH: 243 U/L — ABNORMAL HIGH (ref 98–192)

## 2024-03-05 NOTE — Telephone Encounter (Signed)
 Notified of message below. BM biopsy scheduled for 9/18- Dr Tina notified. Message to scheduler to arrange appts Monday.  He counts dropped more over the last week. I ordered stat BM biopsy. Please have him return on Monday to get ABO/Rh, CBC and SBB in case needs transfusion and request a transfusion at symptom management preemptively on Monday.  Please put him down to see me at 300 and I can see him while he is there anytime after his blood check.

## 2024-03-06 ENCOUNTER — Other Ambulatory Visit: Payer: Self-pay | Admitting: *Deleted

## 2024-03-06 ENCOUNTER — Telehealth: Payer: Self-pay | Admitting: *Deleted

## 2024-03-06 DIAGNOSIS — D649 Anemia, unspecified: Secondary | ICD-10-CM

## 2024-03-06 DIAGNOSIS — D696 Thrombocytopenia, unspecified: Secondary | ICD-10-CM

## 2024-03-06 NOTE — Telephone Encounter (Signed)
 Notified wife of tentative appointment at [facility] on Tuesday for possible transfusion.

## 2024-03-07 LAB — COPPER, SERUM: Copper: 92 ug/dL (ref 69–132)

## 2024-03-08 ENCOUNTER — Ambulatory Visit: Payer: Self-pay

## 2024-03-08 ENCOUNTER — Telehealth: Payer: Self-pay | Admitting: *Deleted

## 2024-03-08 ENCOUNTER — Other Ambulatory Visit: Payer: Self-pay

## 2024-03-08 ENCOUNTER — Inpatient Hospital Stay (HOSPITAL_BASED_OUTPATIENT_CLINIC_OR_DEPARTMENT_OTHER): Admitting: Adult Health

## 2024-03-08 ENCOUNTER — Inpatient Hospital Stay

## 2024-03-08 ENCOUNTER — Other Ambulatory Visit: Payer: Self-pay | Admitting: *Deleted

## 2024-03-08 VITALS — BP 139/56 | HR 60 | Temp 98.0°F | Resp 18

## 2024-03-08 DIAGNOSIS — Z711 Person with feared health complaint in whom no diagnosis is made: Secondary | ICD-10-CM

## 2024-03-08 DIAGNOSIS — Z9889 Other specified postprocedural states: Secondary | ICD-10-CM

## 2024-03-08 DIAGNOSIS — D696 Thrombocytopenia, unspecified: Secondary | ICD-10-CM | POA: Diagnosis not present

## 2024-03-08 DIAGNOSIS — D464 Refractory anemia, unspecified: Secondary | ICD-10-CM | POA: Diagnosis not present

## 2024-03-08 DIAGNOSIS — D649 Anemia, unspecified: Secondary | ICD-10-CM

## 2024-03-08 DIAGNOSIS — C9200 Acute myeloblastic leukemia, not having achieved remission: Secondary | ICD-10-CM | POA: Diagnosis not present

## 2024-03-08 LAB — CBC WITH DIFFERENTIAL (CANCER CENTER ONLY)
Abs Immature Granulocytes: 0.05 K/uL (ref 0.00–0.07)
Basophils Absolute: 0 K/uL (ref 0.0–0.1)
Basophils Relative: 0 %
Eosinophils Absolute: 0.2 K/uL (ref 0.0–0.5)
Eosinophils Relative: 3 %
HCT: 18.5 % — ABNORMAL LOW (ref 39.0–52.0)
Hemoglobin: 6.4 g/dL — CL (ref 13.0–17.0)
Immature Granulocytes: 1 %
Lymphocytes Relative: 19 %
Lymphs Abs: 1 K/uL (ref 0.7–4.0)
MCH: 32 pg (ref 26.0–34.0)
MCHC: 34.6 g/dL (ref 30.0–36.0)
MCV: 92.5 fL (ref 80.0–100.0)
Monocytes Absolute: 0.5 K/uL (ref 0.1–1.0)
Monocytes Relative: 9 %
Neutro Abs: 3.6 K/uL (ref 1.7–7.7)
Neutrophils Relative %: 68 %
Platelet Count: 30 K/uL — ABNORMAL LOW (ref 150–400)
RBC: 2 MIL/uL — ABNORMAL LOW (ref 4.22–5.81)
RDW: 13.2 % (ref 11.5–15.5)
WBC Count: 5.3 K/uL (ref 4.0–10.5)
nRBC: 0.9 % — ABNORMAL HIGH (ref 0.0–0.2)

## 2024-03-08 LAB — SAMPLE TO BLOOD BANK

## 2024-03-08 LAB — PREPARE RBC (CROSSMATCH)

## 2024-03-08 LAB — LACTATE DEHYDROGENASE: LDH: 235 U/L — ABNORMAL HIGH (ref 98–192)

## 2024-03-08 MED ORDER — SODIUM CHLORIDE 0.9% IV SOLUTION
250.0000 mL | INTRAVENOUS | Status: DC
  Administered 2024-03-08: 250 mL via INTRAVENOUS

## 2024-03-08 MED ORDER — LIDOCAINE HCL 2 % IJ SOLN
20.0000 mL | Freq: Once | INTRAMUSCULAR | Status: AC
  Administered 2024-03-08: 400 mg via INTRADERMAL
  Filled 2024-03-08: qty 20

## 2024-03-08 NOTE — Progress Notes (Signed)
 CRITICAL VALUE STICKER  CRITICAL VALUE: Hgb 6.4  RECEIVER (on-site recipient of call): DOROTHA Cook RN  DATE & TIME NOTIFIED: 03/08/24 @ [phone]  MESSENGER (representative from lab): Heather  MD NOTIFIED: Dr Tina  TIME OF NOTIFICATION: MD notified while in infusion, 1015  RESPONSE:  Patient to receive blood

## 2024-03-08 NOTE — Progress Notes (Signed)
 Verbal order w/readback from Dr. Tina for 1 unit PRBCs & 1 unit Plts to be infused on 03/09/2024.  Confirmed orders w/Mike in Blood Bank.

## 2024-03-08 NOTE — Progress Notes (Signed)
 Patient done with his bone marrow biopsy. Post procedure vitals WNL(see flowsheet). Patient denies any pain/distress, biopsy site dressing clean/dry/intact. Patient/wife/daughter waiting to speak to Dr. Tina.

## 2024-03-08 NOTE — Progress Notes (Signed)
 INDICATION: thrombocytopenia, refractory anemia  Brief examination was performed. ENT: adequate airway clearance Heart: regular rate and rhythm.No Murmurs Lungs: clear to auscultation, no wheezes, normal respiratory effort  Bone Marrow Biopsy and Aspiration Procedure Note   Informed consent was obtained and potential risks including bleeding, infection and pain were reviewed with the patient.  The patient's name, date of birth, identification, consent and allergies were verified prior to the start of procedure and time out was performed.  The right posterior iliac crest was chosen as the site of biopsy.  The skin was prepped with ChloraPrep.   8 cc of 2% lidocaine  was used to provide local anaesthesia.   10 cc of bone marrow aspirate was obtained followed by 1cm biopsy.  Pressure was applied to the biopsy site and bandage was placed over the biopsy site. Patient was made to lie on the back for 30 mins prior to discharge.  The procedure was tolerated well. COMPLICATIONS: None BLOOD LOSS: none The patient was discharged home in stable condition with a 1 week follow up to review results.  Patient was provided with post bone marrow biopsy instructions and instructed to call if there was any bleeding or worsening pain.  Specimens sent for flow cytometry, cytogenetics and additional studies.  Signed Morna JAYSON Kendall, NP

## 2024-03-08 NOTE — Telephone Encounter (Signed)
 Referral fax's to [facility] to Dr. Perri for possible leukemia. Received confirmation of receipt of referral. Pt is aware of referral.

## 2024-03-08 NOTE — Telephone Encounter (Signed)
 Patient seen in infusion room after bone marrow biopsy, spoke to wife and daughter regarding his rapid drop of hemoglobin and platelet level.  Increased nucleated red cells.  There is no signs of bleeding including hematuria hematochezia, abdominal distention or visible bruising to suggest blood loss.  Called and spoke to leukemia team at [facility], discussed concerning presentation of acute leukemia.  Spoke to Dr. Perri, who will have the their team call patient for urgent appointment early next week.  Spoke to patient and family again.  If developing short of breath, dizziness, lightheadedness, chest discomfort or tightness, signs of worsening anemia, bleeding report to ED immediately for evaluation.  They understands.

## 2024-03-08 NOTE — Patient Instructions (Addendum)
 Blood Transfusion, Adult, Care After After a blood transfusion, it is common to have: Bruising and soreness at the IV site. A headache. Follow these instructions at home: Your doctor may give you more instructions. If you have problems, contact your doctor. Insertion site care     Follow instructions from your doctor about how to take care of your insertion site. This is where an IV tube was put into your vein. Make sure you: Wash your hands with soap and water for at least 20 seconds before and after you change your bandage. If you cannot use soap and water, use hand sanitizer. Change your bandage as told by your doctor. Check your insertion site every day for signs of infection. Check for: Redness, swelling, or pain. Bleeding from the site. Warmth. Pus or a bad smell. General instructions Take over-the-counter and prescription medicines only as told by your doctor. Rest as told by your doctor. Go back to your normal activities as told by your doctor. Keep all follow-up visits. You may need to have tests at certain times to check your blood. Contact a doctor if: You have itching or red, swollen areas of skin (hives). You have a fever or chills. You have pain in the head, back, or chest. You feel worried or nervous (anxious). You feel weak after doing your normal activities. You have any of these problems at the insertion site: Redness, swelling, warmth, or pain. Bleeding that does not stop with pressure. Pus or a bad smell. If you received your blood transfusion in an outpatient setting, you will be told whom to contact to report any reactions. Get help right away if: You have signs of a serious reaction. This may be coming from an allergy  or the body's defense system (immune system). Signs include: Trouble breathing or shortness of breath. Swelling of the face or feeling warm (flushed). A widespread rash. Dark pee (urine) or blood in the pee. Fast heartbeat. These symptoms  may be an emergency. Get help right away. Call 911. Do not wait to see if the symptoms will go away. Do not drive yourself to the hospital. Summary Bruising and soreness at the IV site are common. Check your insertion site every day for signs of infection. Rest as told by your doctor. Go back to your normal activities as told by your doctor. Get help right away if you have signs of a serious reaction. This information is not intended to replace advice given to you by your health care provider. Make sure you discuss any questions you have with your health care provider. Document Revised: 09/17/2021 Document Reviewed: 09/17/2021 Elsevier Patient Education  2024 [employer]   Bone Marrow Aspiration and Bone Marrow Biopsy, Adult, Care After The following information offers guidance on how to care for yourself after your procedure. Your health care provider may also give you more specific instructions. If you have problems or questions, contact your health care provider. What can I expect after the procedure? After the procedure, it is common to have: Mild pain and tenderness. Swelling. Bruising. Follow these instructions at home: Incision care  Follow instructions from your health care provider about how to take care of the incision site. Make sure you: Wash your hands with soap and water for at least 20 seconds before and after you change your bandage (dressing). If soap and water are not available, use hand sanitizer. Change your dressing as told by your health care provider. Leave stitches (sutures), skin glue, or adhesive strips in  place. These skin closures may need to stay in place for 2 weeks or longer. If adhesive strip edges start to loosen and curl up, you may trim the loose edges. Do not remove adhesive strips completely unless your health care provider tells you to do that. Check your incision site every day for signs of infection. Check for: More redness, swelling, or  pain. Fluid or blood. Warmth. Pus or a bad smell. Activity Return to your normal activities as told by your health care provider. Ask your health care provider what activities are safe for you. Do not lift anything that is heavier than 10 lb (4.5 kg), or the limit that you are told, until your health care provider says that it is safe. If you were given a sedative during the procedure, it can affect you for several hours. Do not drive or operate machinery until your health care provider says that it is safe. General instructions  Take over-the-counter and prescription medicines only as told by your health care provider. Do not take baths, swim, or use a hot tub until your health care provider approves. Ask your health care provider if you may take showers. You may only be allowed to take sponge baths. If directed, put ice on the affected area. To do this: Put ice in a plastic bag. Place a towel between your skin and the bag. Leave the ice on for 20 minutes, 2-3 times a day. If your skin turns bright red, remove the ice right away to prevent skin damage. The risk of skin damage is higher if you cannot feel pain, heat, or cold. Contact a health care provider if: You have signs of infection. Your pain is not controlled with medicine. You have cancer, and a temperature of 100.63F (38C) or higher. Get help right away if: You have a temperature of 101F (38.3C) or higher, or as told by your health care provider. You have bleeding from the incision site that cannot be controlled. This information is not intended to replace advice given to you by your health care provider. Make sure you discuss any questions you have with your health care provider. Document Revised: 10/25/2021 Document Reviewed: 10/25/2021 Elsevier Patient Education  2024 [employer].

## 2024-03-09 ENCOUNTER — Inpatient Hospital Stay

## 2024-03-09 DIAGNOSIS — D696 Thrombocytopenia, unspecified: Secondary | ICD-10-CM

## 2024-03-09 DIAGNOSIS — C9200 Acute myeloblastic leukemia, not having achieved remission: Secondary | ICD-10-CM | POA: Diagnosis not present

## 2024-03-09 DIAGNOSIS — D464 Refractory anemia, unspecified: Secondary | ICD-10-CM

## 2024-03-09 DIAGNOSIS — Z9889 Other specified postprocedural states: Secondary | ICD-10-CM

## 2024-03-09 DIAGNOSIS — D649 Anemia, unspecified: Secondary | ICD-10-CM

## 2024-03-09 MED ORDER — SODIUM CHLORIDE 0.9% IV SOLUTION
250.0000 mL | INTRAVENOUS | Status: DC
  Administered 2024-03-09: 250 mL via INTRAVENOUS

## 2024-03-09 MED ORDER — SODIUM CHLORIDE 0.9% IV SOLUTION: 250.0000 mL | INTRAVENOUS | Status: DC

## 2024-03-09 NOTE — Patient Instructions (Signed)
 Blood Transfusion, Adult, Care After After a blood transfusion, it is common to have: Bruising and soreness at the IV site. A headache. Follow these instructions at home: Your doctor may give you more instructions. If you have problems, contact your doctor. Insertion site care     Follow instructions from your doctor about how to take care of your insertion site. This is where an IV tube was put into your vein. Make sure you: Wash your hands with soap and water for at least 20 seconds before and after you change your bandage. If you cannot use soap and water, use hand sanitizer. Change your bandage as told by your doctor. Check your insertion site every day for signs of infection. Check for: Redness, swelling, or pain. Bleeding from the site. Warmth. Pus or a bad smell. General instructions Take over-the-counter and prescription medicines only as told by your doctor. Rest as told by your doctor. Go back to your normal activities as told by your doctor. Keep all follow-up visits. You may need to have tests at certain times to check your blood. Contact a doctor if: You have itching or red, swollen areas of skin (hives). You have a fever or chills. You have pain in the head, back, or chest. You feel worried or nervous (anxious). You feel weak after doing your normal activities. You have any of these problems at the insertion site: Redness, swelling, warmth, or pain. Bleeding that does not stop with pressure. Pus or a bad smell. If you received your blood transfusion in an outpatient setting, you will be told whom to contact to report any reactions. Get help right away if: You have signs of a serious reaction. This may be coming from an allergy  or the body's defense system (immune system). Signs include: Trouble breathing or shortness of breath. Swelling of the face or feeling warm (flushed). A widespread rash. Dark pee (urine) or blood in the pee. Fast heartbeat. These symptoms  may be an emergency. Get help right away. Call 911. Do not wait to see if the symptoms will go away. Do not drive yourself to the hospital. Summary Bruising and soreness at the IV site are common. Check your insertion site every day for signs of infection. Rest as told by your doctor. Go back to your normal activities as told by your doctor. Get help right away if you have signs of a serious reaction. This information is not intended to replace advice given to you by your health care provider. Make sure you discuss any questions you have with your health care provider. Document Revised: 09/17/2021 Document Reviewed: 09/17/2021 Elsevier Patient Education  2024 [employer] Getting Platelets Through an IV (Platelet Transfusion) in Adults: What to Expect A platelet transfusion is when a person gets platelets from another person (donor) through an IV. Platelets are parts of blood that stick together and form a clot to help stop bleeding after an injury. If you don't have enough platelets, you might bleed or bruise easily. You may need a platelet transfusion if you have a health problem that causes a low number of platelets, such as thrombocytopenia. A platelet transfusion may be used to stop or prevent too much bleeding. Tell a health care provider about: Any reactions you've had during past transfusions. Any allergies you have. All medicines you take. These include vitamins, herbs, eye drops, and creams. Any bleeding problems you have. Any surgeries you've had. Any health problems you have. Whether you're pregnant or may be pregnant. What  are the risks? Your health care provider will talk with you about risks. These may include: Fever or chills. This might happen if your body reacts to the new cells. It can happen during or up to 4 hours after the transfusion. A mild allergy . You might get red, swollen areas on your skin (hives). You might feel itchy. A bad allergy . You might have trouble  breathing or swelling around your face and lips. Very bad reactions are rare but can happen. These may be: Infection. This is rare because donated blood is carefully tested. Hemolytic reaction. This is when the defense system (immune system) of your body attacks the new cells. Symptoms include fever, chills, and a feeling that you may throw up. You may also have low blood pressure and pain in your chest or lower back. Transfusion-associated circulatory overload (TACO). This happens if fluids move to your lungs. This may cause breathing problems. Transfusion-related acute lung injury (TRALI). TRALI can cause breathing problems and low oxygen in your blood. This can happen within hours or days after the transfusion. Transfusion-associated graft-versus-host disease (TAGVHD). This happens when donated cells attack the body's healthy tissues. What happens before? Take your medicines only as told. You will have a blood test to find out your blood type. This helps match the donor blood with your blood. If you've had an allergy  to a transfusion in the past, you may be given medicine to help prevent another allergy . Your temperature, blood pressure, pulse, and breathing will be checked. What happens during platelet transfusion?  An IV will be put into a vein in your hand or arm. For your safety, two health care team members will check your identity and the donor platelets. The bag of donor platelets will be connected to your IV. The platelets will flow into your blood. This usually takes 30-60 minutes. Your temperature, blood pressure, pulse, and breathing will be checked. This helps catch signs of a reaction early. You will be watched for symptoms of all types of reactions, like chills, hives, or itching. If you show signs of a reaction, the transfusion will be stopped. You may be given medicine to help treat the reaction. When your transfusion is done, your IV will be removed. Pressure may be put on  the IV site for a few minutes to stop any bleeding. The IV site will be covered with a bandage. These steps may vary. Ask what you can expect. What happens after? You will be watched closely until you leave. This includes checking your blood pressure, temperature, pulse rate, and breathing rate again. This information is not intended to replace advice given to you by your health care provider. Make sure you discuss any questions you have with your health care provider. Document Revised: 10/06/2023 Document Reviewed: 10/06/2023 Elsevier Patient Education  2025 [employer].

## 2024-03-11 ENCOUNTER — Inpatient Hospital Stay

## 2024-03-11 ENCOUNTER — Ambulatory Visit

## 2024-03-11 LAB — TYPE AND SCREEN
ABO/RH(D): A POS
Antibody Screen: NEGATIVE
Unit division: 0
Unit division: 0

## 2024-03-11 LAB — BPAM PLATELET PHERESIS
Blood Product Expiration Date: 202509072359
ISSUE DATE / TIME: 202509060836
Unit Type and Rh: 7300

## 2024-03-11 LAB — BPAM RBC
Blood Product Expiration Date: 202510052359
Blood Product Expiration Date: 202510052359
ISSUE DATE / TIME: 202509051315
ISSUE DATE / TIME: 202509060835
Unit Type and Rh: 6200
Unit Type and Rh: 6200

## 2024-03-11 LAB — PREPARE PLATELET PHERESIS: Unit division: 0

## 2024-03-11 NOTE — Telephone Encounter (Signed)
 All Items Received

## 2024-03-11 NOTE — Progress Notes (Signed)
  Name:Jeff Weaver       MRN: [id number] Age/Date of Birth: 78 y.o. 05/22/1946       Date of Contact: 03/11/2024 [facility] [address] [place] [id number] Dept: [phone]    Initial telephone call summary:  Navigator call intiated to daughter of [name], [name], before  appointment.  Explained navigator role, provided my contact information, offered directions, and discussed patient's understanding of reason for appointment, barriers to attending appointments, and any other concerns related to appointment. Kathyrn verbalized understanding of information discussed during today's call. Kahtyrn and patient's wife [name] will be in attendance with patient tomorrow.  Oncology Navigator Initial Assessment Documentation:  No immediate barriers to attending appointments.   Delon Buckles, RN

## 2024-03-12 ENCOUNTER — Encounter (HOSPITAL_COMMUNITY): Payer: Self-pay

## 2024-03-12 ENCOUNTER — Encounter

## 2024-03-12 LAB — SURGICAL PATHOLOGY

## 2024-03-12 NOTE — Progress Notes (Addendum)
 Hematology and Oncology New Patient Visit  Patient: Jeff Weaver Date: 03/12/2024 MRN: [id number]  Dear Dr. Tina,  Thank you very much for referring Mr. Riding to Dr. Atha clinic for evaluation of Anemia and Thormbocytopenia.  I know you are very well aware of his clinic course, but please allow me to summarize for our records.   Mr. 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  Recently presented to ED on 02/21/24 at the urging of PCP and was found to have Hgb 8.6 and Plts of 104. Previous counts reviewed with most recent 10/2023 with normal hemoglobin 14.4. Ferritin, B12, and folate all within normal range. Counts further declined to Hgb 6.5 and Plt 30k on 03/08/24 when he saw Dr. [facility] at [facility]. Bone marrow biopsy performed at [facility] with results pending. 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 He was given 1 unit PRBCs and 1 unit of platelets on 03/08/24 at [facility].   Mr. Appling states he has noticed a decrease in energy over the last few months. He reports back in April he was able to walk without assistance in his home but now he requires the assistance of a walker in wheelchair.  His wife and daughter are present with him and assist with history as patient is a poor historian. He reports he has occasional night sweats in which he has a sweaty back at night. He denies any chills or fevers but does report he is cold a lot. He endorses significant swelling  in his legs and feet and reports his torsemide  was recently increased to adjust the fluid overload. He denies any bleeding issues. He denies any bleeding gums, epistaxis, bloody stools, or hematuria. His wife confirms she has not noted any bloody stools or bleeding issues. He does note easy bruising and has several bruises on his arms due to bumping into things. He has a very poor short term memory likely due to his presumed lewy body dementia. He follows with neurology. His wife reports he was relatively healthy until he had surgery for his brain aneurysm in 2013. He has since had a lot of medical issues including seizure disorder. He reports his energy level is very low. He does note chronic back pain and leg pain associated with arthritis but denies any additional pain. He reports he has a good appetite. He denies any signs and symptoms of infection. He denies any cough, congestion, sore throat or headache. He reports occasional chest pains that are stable for him. Does note increased shortness of breath on exertion. Denies any nausea, vomiting, constipation or urinary symptoms.  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 He does mention exposure to agent orange during that time. 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 He is currently retired and lives with his wife at home in [place]. They have 1 daughter who lives in [place]. He denies any personal history or family history of cancer.    A Complete ROS was performed and negative if not  mentioned above.  ECOG/Zubrod Performance Status: 3 - Capable of only limited selfcare; confined to bed or chair > 50 percent of waking hours KPS (40-30%)   Medical History[1]  Allergies as of 03/12/2024 - Reviewed 03/12/2024  Allergen Reaction Noted  . Haloperidol Rash and Other (See Comments) 12/05/2014  . Haloperidol lactate  04/13/2022  . Lisinopril Cough 01/27/2014  . Nifedipine  Other (See  Comments) 01/30/2018    Current Rx ordered in [facility][2]  Social History   Social History Narrative  . Not on file    family history includes Arrhythmia in his sister; Cirrhosis in his mother; Coronary artery disease in his father; Dementia in his sister; Diabetes in his brother and sister; Emphysema in his father; Heart disease in his brother, father, and sister; Liver disease in his mother; Lung disease in his sister; No Known Problems in his maternal grandfather, maternal grandmother, paternal grandfather, and paternal grandmother.  Review Flowsheet  More data exists      11/14/2023 01/03/2024 02/06/2024 03/12/2024  ONCBCN RECENT VITALS  Height 173 cm 173 cm 173 cm 173 cm -  Weight 109.226 kg 110.224 kg 110.224 kg 108.863 kg 110 kg 107.094 kg  BSA (Calculated - sq m) 2.29 m2 2.3 m2 2.3 m2 2.29 m2 2.3 m2 -  Temp 98.1 F (36.7 C) 97.2 F (36.2 C) 97.9 F (36.6 C) 98.2 F (36.8 C) 98.5 F (36.9 C)  BP 122/74 159/76 171/71* 134/76 98/59* 125/73 158/71  Heart Rate 65 60 65 60 60 61  Respiratory Rate - - - 16  Oxygen Saturation 94 % 97 % 98 % 98 % 96 % 99 %    Details       Multiple values from one day are sorted in reverse-chronological order        GENERAL:Mr. Masterson is a 78 yr old  overweight male in no acute distress. Alert and oriented x 3. Examined in wheelchair.  HEENT: Nares and oropharynx clear.  No erythema or exudate.  Mucous membranes pink and moist. NECK: Supple without lymphadenopathy. CARDIOVASCULAR: Regular rate and rhythm; no murmurs, rubs or gallops. LUNGS: Clear to auscultation bilaterally. No wheezes, crackles or rhonchi.  ABDOMEN: Bowel sounds present.  Soft, non tender, non distended with no hepatosplenomegaly appreciated. EXTREMITIES: 2-3 pitting edema in bilateral lower extremities 5/5 strength throughout. LYMPHATIC: No cervical, supraclavicular, or axillary adenopathy SKIN: Few scattered ecchymoses on upper extremities.  No results found for this or  any previous visit (from the past 24 hours).   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 Counts notable for anemia and thrombocytopenia. Will likely need transfusion support later this week. No bleeding issues today. Prelim results from marrow suggestive of 10% blasts on flow cytometry and morphologic dysplasia. Discussed with patient the diagnosis of MDS and leukemia. Explained MDS likely would be contributing to low counts. Explained would need final marrow results for diagnosis and staging purposes. Discussed treatment options of MDS to include supportive care, supportive car e with transfusion support and HMA treatment. Discussed family should discuss what they would like their goals of care wishes to be as patient has multiple other medical comorbidities. Explained we would discuss treatment and options at more depth once diagnosis and staging finalized.  Will have transfusion support either set up locally or here for Friday this week and will be in touch to schedule appointment to further discuss results when available.  Family preferred visit to phone call to discuss results. Mr. Portela and his family expressed understanding and agreement with plan and knows to call with any questions or concerns.      Transfusions  - None needed today - Hgb goal >8.0 and Plt goal>20k   Transplant  - Due to multiple comorbidities and patient's age, not a candidate for transplant at this time.   ID  - Afebrile and not neutropenic. 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 Continues torsemide  10 mg  daily.  - Bradycardia and AV block s/p PPM 06/2018 with upgrade to medtronic CRT-P in 03/2021 due to systolic dysfunction felt possibly due to RV pacing  - Moderate Aortic Insufficiency and Mild Mitral Regurgitation, stable by last echo 10/2023.  - CAD with long history of atypical chest pain. Nitrostat  PRN  - Follows with Dayna Dunn PA-C and Dr. Soyla Lunger At [facility]. Last seen 12/14/2023.   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 Genevie. Last seen 11/14/2023.   GI  - remote history of Crohn's disease  - history of adenomatous polyps  - GERD  - Last EGD/Colonoscopy 02/25/2020   GU  - BPH, continues flomax  0.4 mg every day and finasteride 5mg  every day.  -Urinary incontinence, continues Mirbetriq 50 mg every day  - Hx hematuria,  recommended CT urogram and cystoscopy bu thas not yet been scheduled.  -Follows with [facility] U[facility] NP< last seen 01/03/24   Access  - Peripheral Draw      Thank you again for referring Mr. Dibello to our clinic.  If you have any questions or concerns regarding our plan or Mr. Stief's case, please do not hesitate to contact us .   Norlene Candice Ned, PA-C 4:42 PM 03/13/2024    This is a shared visit with Dr. Perri.  >60 minutes of my time was spent in discussion with the patient regarding potential diagnosis and in review of prior records.   Electronically signed by:  Norlene Candice Ned, PA-C, 03/13/2024 4:39 PM  This is a shared visit with Norlene Candice Ned, PA-C. I reviewed the history, reviewed labs, and discussed the  assessment and plan with the patient.   We saw Mr Nordin in consultation for evaluation of his rapidly progressing pancytopenia. He is referred by Dr. Donnajean Chihuahua at [facility]. He is accompanied by his wife and daughter. Mr Knoth has multiple co-morbidities as outlined above. His bone marrow results are pending but his labs and clinical course suggest either high risk MDS or AML. Labs reviewed (see flow sheet).   We reviewed his history, his labs and discussed the probable diagnoses.We discussed probable treatment options including no treatment, supportive care only including transfusions, and HMA based therapy. I openly discussed the impact of his co-morbidities and whether his current QOL was such that he and his family  feel it is desirable to try to extend his life. We addressed their questions. I offered to contact them by phone to discuss further when final bone marrow results are available but they prefer to return in person. We will schedule follow up when results are available.  Patient was encouraged to call in the interim with questions or concerns.  Electronically signed by: Karie LITTIE Monte, MD 03/21/2024 5:06 PM         [1] Past Medical History: Diagnosis Date  . Aftercare following surgery of the circulatory system, NEC 04/27/2012  . Aftercare following surgery of the nervous system, NEC 04/27/2012  . 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 Entropion of right lower eyelid   . Hyperlipidemia   . Hypertension   . OSA (obstructive sleep apnea) 01/26/2012  . Polymyalgia (CMD)   . Seizures    (CMD)   [2] Meds Ordered in [facility]  Medication Sig Dispense Refill  . acetaminophen  (TYLENOL ) 325 mg tablet Take 650 mg by mouth.    . benzonatate  (TESSALON ) 200 mg capsule TAKE 1 CAPSULE BY MOUTH 3 TIMES DAILY AS NEEDED FOR UP TO 10 DAYS FOR COUGH.    . carbidopa-levodopa (SINEMET) 25-100 mg per tablet Take one and a half tablets by mouth 3 (three) times a day. 405 tablet 3  .  cetirizine  (ZyrTEC ) 10 mg tablet Take 10 mg by mouth Once Daily.    . cholecalciferol  (VITAMIN D3) 2,000 unit cap capsule 1 capsule DAILY (route: oral)    . clonazePAM (KlonoPIN) 0.5 mg disintegrating tablet Dissolve 1 mg on tongue.    . clotrimazole  (LOTRIMIN ) 1 % cream Apply 1 Application topically as needed.    . cyanocobalamin  (VITAMIN B12) 1,000 mcg tablet Take 1,000 mcg by mouth Once Daily.    . donepeziL  (ARICEPT ) 10 mg tablet Take 1 tablet (10 mg total) by mouth daily. 90 tablet 3  . finasteride (PROSCAR) 5 mg tablet Take 1 tablet (5 mg total) by mouth daily. 90 tablet 3  . ipratropium (ATROVENT ) 42 mcg (0.06 %) spry nasal spray 2 sprays 4 (four) times a day as needed (rhinitis). 15 mL 2  . lacosamide  (Vimpat ) 200 mg tab TAKE 1 TABLET (200 MG TOTAL) BY MOUTH 2 TIMES A DAY. 180 tablet 1  . levETIRAcetam  (KEPPRA ) 500 mg tablet Take 3 tablets (1,500 mg total) by mouth 2 (two) times a day. 540 tablet 3  . loperamide  (IMODIUM ) 2 mg capsule Take 2 mg by mouth.    . LORazepam  (ATIVAN ) 1 mg tablet Take 1 tablet (1 mg total) by mouth every 8 (eight) hours as needed for anxiety. For seizure rescue 10 tablet 1  . midodrine  (PROAMATINE ) 2.5 mg tablet Take 2.5 mg by mouth daily.    . mirabegron  (Myrbetriq ) 50 mg Tb24 Take 1 tablet (50 mg total) by mouth daily. 90 tablet 3  . nitroglycerin  (NITROSTAT ) 0.4 mg SL tablet Place 0.4 mg under the tongue as needed for chest pain.    . nystatin  (MYCOSTATIN ) 100,000 unit/gram ointment Apply topically as needed.    . pyridoxine  (VITAMIN B6) 100 mg tablet 1 tablet DAILY (route: oral)    . rosuvastatin  (CRESTOR ) 5 mg tablet 5 mg 3 (three) times a week.    . tamsulosin  (FLOMAX ) 0.4 mg cap Take 1 capsule (0.4 mg total) by mouth daily. Will need an office appointment prior to any further refills. 90 capsule 3  . thiamine  (VITAMIN B1) 100 mg tablet Take 100 mg by mouth Once Daily.    SABRA  torsemide  (DEMADEX ) 20 mg tablet Take 10 mg by mouth daily.    . triamcinolone   (KENALOG ) 0.5 % cream Apply topically as needed.    . diclofenac sodium (VOLTAREN) 1 % gel Apply 1 g topically 3 (three) times a day. (Patient not taking: Reported on 03/12/2024) 90 g 0  . metoprolol  succinate (TOPROL  XL) 50 mg 24 hr tablet Take 0.5 tablets by mouth daily. (Patient not taking: Reported on 03/12/2024)     No current Epic-ordered facility-administered medications on file.  *Some images could not be shown.

## 2024-03-14 NOTE — Telephone Encounter (Addendum)
 Bone Marrow Results from [facility] scanned into chart.  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 10% blats by flow cytometry. Cytogenetics and NGS pending. FISH for PMLRARA negative.  [facility] Number: [id number]   Added on a visit to see patient when he comes to clinic for transfusion tomorrow.   Attempted to call wife and unable to reach, Attempted to call daughter and left voice mial. Explained we are happy to discuss results tomorrow in visit or we can schedule a visit next week to discuss depending on her availability to be able to come to appointment. Asked her to call back and let us  know.   Thanks, Hailey  Electronically signed by:  Norlene Candice Ned, PA-C, 03/14/2024 9:44 AM

## 2024-03-15 NOTE — Nursing Note (Signed)
 Patient tolerated treatment without complication. Vital signs stable. Patient discharged alert, oriented, and in wheelchair, accompanied by family.

## 2024-03-15 NOTE — Progress Notes (Addendum)
 Hematology and Oncology Follow Up Visit   Patient: Jeff Weaver Date: 03/18/2024 MRN: [id number]  Oncology History  MDS (myelodysplastic syndrome)    (CMD) (Resolved)  Acute myeloid leukemia not having achieved remission    (CMD)  03/08/2024 Biopsy   OSH Bmbx with hypercellular bone marrow (90%) involved by AML with 20-25% blasts by CD34 - Flow cytometry with 10% blasts -FISH for PML-RARA negative   03/14/2024 -  Supportive Treatment   Adult OP Blood Administration PRN x 30 DAYS Plan Provider: Karie LITTIE Monte, MD   03/15/2024 Initial Diagnosis   Acute myeloid leukemia not having achieved remission    (CMD)      CURRENT STATUS: This is a shared visit with Dr. Monte.  Dr. Monte and I saw Mr. Jeff Weaver in clinic today for follow up. He is accompanied by his supportive wife and daughter who help provide most of the history. He presents today in his wheelchair and reports he continues to have fatigue. Notes more fatigue since Tuesday when we last saw him. He continues to have a good appetite. He denies any signs or symptoms of infection. He denies any fevers, chills, night sweats or new lymphadenopathy. He continues to have bruises on his arms but denies any evidence of bleeding. He denies any chest pain. He has stable SOB with exertion. He denies any nausea, vomiting, constipation or diarrhea. He does not an occasional chronic pain in his left side that he reports comes and goes and has been present for months. He also notes pain in his bilateral knees. He denies any urinary symptoms. He has concerns about his new diagnosis and has questions regarding prognosis and life expectancy. He continues to have issues with his short term memory and relies on his family for significant help. He has no other questions or concerns.  A complete ROS was performed and was negative except what is noted in current status.  ECOG/Zubrod Performance Status: 3 - Capable of only limited selfcare; confined to bed or  chair > 50 percent of waking hours KPS (40-30%)  Medical History[1]  Current Rx ordered in [facility][2]  PHYSICAL EXAM: Review Flowsheet  More data exists      01/03/2024 02/06/2024 03/12/2024 03/15/2024  ONCBCN RECENT VITALS  Height 173 cm 173 cm 173 cm - 173 cm  Weight 110.224 kg 108.863 kg 110 kg 107.094 kg 107.049 kg  BSA (Calculated - sq m) 2.3 m2 2.29 m2 2.3 m2 - 2.27 m2  Temp 97.9 F (36.6 C) 98.2 F (36.8 C) 98.5 F (36.9 C) 97.4 F (36.3 C) 97.5 F (36.4 C) 97.2 F (36.2 C) 97.5 F (36.4 C) 97.1 F (36.2 C)  BP 134/76 98/59* 125/73 158/71 156/60 127/50* 144/61 128/51* 124/58*  Heart Rate 65 60 60 61 62 64 62 58 61  Respiratory Rate - - 16 18 18 18 18 16   Oxygen Saturation 98 % 98 % 96 % 99 % 99 % 100 % 100 % 100 % 97 %  Oxygen Device - - - None (Room air) None (Room air)    Details       Multiple values from one day are sorted in reverse-chronological order         GENERAL:Mr. Jeff Weaver is a 78 yr old chronically ill male in no acute distress. Alert and oriented x 3 and  examined in wheelchair. HEENT: Nares and oropharynx clear.  No erythema or exudate.  Mucous membranes pink and moist. NECK: Supple without lymphadenopathy. CARDIOVASCULAR: Regular rate and  rhythm; no murmurs, rubs or gallops. LUNGS: Clear to auscultation bilaterally. No wheezes, crackles or rhonchi.  ABDOMEN: Bowel sounds present.  Soft, non tender, non distended with no hepatosplenomegaly appreciated. EXTREMITIES: 2-3 pitting edema in bilateral lower extremities. 5/5 strength throughout. LYMPHATIC: No cervical, supraclavicular, or axillary adenopathy SKIN: No new rashes or bruises observed.  NEUROLOGIC: Nonfocal. Issues with short term memory but alert today.  LABS: No results found for this or any previous visit (from the past 24 hours).    IMPRESSION and PLAN:  Impression Plan  Acute Myeloid Leukemia - Diagnosed 03/08/24 on OSH Bmbx with  hypercellular marrow (90%) with 20-25% blasts by  CD34 and 10% blasts by flow cytometry. FISH for PMLRARA negative.   - Discussed results of bone marrow biopsy with the patient and his family.  Will also request slides from [facility] to be sent over for our pathologist to review. Discussed treatment options again to include best supportive care, supportive care with transfusion support, single agent HMA or single agent HMA with venetoclax. The patient opts to proceed with treatment. Will plan to proceed with single agent HMA for C1 in the outpatient setting and may consider adding venetoclax in for C2 if he tolerates things well. Explained we would need to repeat a bone marrow biopsy after C1 to determine treatment response ( will consider if he may require a CT guided marrow) Educational materials were provided to patient and family on AML and Azacitidine. They will officially be consented next week. Will plan to start allopurinol for TLS prophylaxis and script was sent in today. Will continue weekly labs and possible transfusions, may need to increase to twice weekly if transfusion needs increase over the next few weeks. Will plan to see him back next week 03/22/24 to confirm treatment plans and consent for chemotherapy. Will need PICC line placement from IR. Scheduled for 9 am on 03/22/24. Mr. Patalano and his family expressed agreement and understanding of plan.   Transfusions  - 1 unit PRBCs for Hgb 7.5. 1 unit of platelets given for plt count of 17k. - Hgb goal >8.0  and Plt goal> 20k   Transplant  - Due to multiple comorbidities and patient's age, not a candidate for transplant at this time.    ID  - Afebrile and not neutropenic. 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 Continues torsemide  10 mg daily. Did not take torsemide  today so we will give IV lasix  20 mg post-transfusion to help with fluid overload.   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   - CAD with long history of atypical chest pain. Nitrostat  PRN   - Follows with Dayna Dunn PA-C and Dr. Soyla Lunger At [facility]. Last seen 12/14/2023. - Patient would like referral to establish with cardiologist at [facility]. Referral sent today.   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 Genevie. Last seen 11/14/2023.   GI  - remote history of Crohn's disease   - history of adenomatous polyps   - GERD   - Last EGD/Colonoscopy 02/25/2020  - Nonspecific left sided chronic pain. Denies constipation or bleeding issues. Will continue to monitor closely. If persists may need further cardiac work-up. Referral sent to cardiology to establish care.    GU  - BPH, continues flomax  0.4 mg every day and finasteride 5mg  every day.   -Urinary incontinence, continues Mirbetriq 50 mg every day   - Hx hematuria,  recommended CT urogram and cystoscopy bu thas not yet been scheduled.   -Follows with Delon Sumner Urology NP, last seen 01/03/24   MSK  - Notes bilateral knee pain, takes tylenol  PRN. Will send in low dose tramadol  25 mg to only be used a max of BID PRN.   Access  - Peripheral Draw. Will plan for PICC  placement. Scheduled for 03/22/24.    This is a shared visit with Dr. Perri. > 60 minutes of my time was sent in discussion with the patient and reviewing results.  Electronically signed by:  Norlene Candice Ned, PA-C, 03/18/2024 9:28 AM   This is a shared visit with Norlene Candice Ned, PA-C. I reviewed the history, reviewed labs, and discussed the assessment and plan with the patient.   Mr Thain returns for evaluation and treatment of his newly diagnosed AML. Final report for recent bone marrow is AML with 20-25% blasts. We reviewed our prior discussion and expanded to include the final results of AML (vs MDS-EB2). He, with the support of his family, again expressed a desire to receive treatment. I reviewed that early trials with azacitidine included patients with 20-30% blasts (at that time was included in MDS as RAEBT) and results were very similar to patients with < 20% blasts. With his multiple co-morbidities I recommend we start with single agent azacitidine x 7 days, repeated every 4 weeks to assess both response and tolerance; we will repeat a bone marrow after 1-2 courses and if inadequate response we will consider adding venetoclax. We addressed questions.  We will place a PICC, get consent, plan to start allopurinol and begin treatment next week.  Patient was encouraged to call in the interim with questions or concerns.  Electronically signed by: Karie LITTIE Perri, MD 03/21/2024 11:02 AM        [1] Past Medical History: Diagnosis Date  . Aftercare following surgery of the circulatory system, NEC 04/27/2012  . Aftercare following surgery of the nervous system, NEC 04/27/2012  . 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 Entropion of right lower eyelid   . Hyperlipidemia   . Hypertension   . OSA (obstructive sleep apnea) 01/26/2012  . Polymyalgia (CMD)   . Seizures    (CMD)   [2] Meds Ordered in [facility]  Medication Sig Dispense Refill  . acetaminophen  (TYLENOL ) 325 mg  tablet Take 650 mg by mouth.    . benzonatate  (TESSALON ) 200 mg capsule TAKE 1 CAPSULE BY MOUTH 3 TIMES DAILY AS NEEDED FOR UP TO 10 DAYS FOR COUGH.    . carbidopa-levodopa (SINEMET) 25-100 mg per tablet Take one and a half tablets by mouth 3 (three) times a day. 405 tablet 3  .  cetirizine  (ZyrTEC ) 10 mg tablet Take 10 mg by mouth Once Daily.    . cholecalciferol  (VITAMIN D3) 2,000 unit cap capsule 1 capsule DAILY (route: oral)    . clonazePAM (KlonoPIN) 0.5 mg disintegrating tablet Dissolve 1 mg on tongue.    . clotrimazole  (LOTRIMIN ) 1 % cream Apply 1 Application topically as needed.    . cyanocobalamin  (VITAMIN B12) 1,000 mcg tablet Take 1,000 mcg by mouth Once Daily.    . donepeziL  (ARICEPT ) 10 mg tablet Take 1 tablet (10 mg total) by mouth daily. 90 tablet 3  . finasteride (PROSCAR) 5 mg tablet Take 1 tablet (5 mg total) by mouth daily. 90 tablet 3  . ipratropium (ATROVENT ) 42 mcg (0.06 %) spry nasal spray 2 sprays 4 (four) times a day as needed (rhinitis). 15 mL 2  . lacosamide  (Vimpat ) 200 mg tab TAKE 1 TABLET (200 MG TOTAL) BY MOUTH 2 TIMES A DAY. 180 tablet 1  . levETIRAcetam  (KEPPRA ) 500 mg tablet Take 3 tablets (1,500 mg total) by mouth 2 (two) times a day. 540 tablet 3  . loperamide  (IMODIUM ) 2 mg capsule Take 2 mg by mouth.    . LORazepam  (ATIVAN ) 1 mg tablet Take 1 tablet (1 mg total) by mouth every 8 (eight) hours as needed for anxiety. For seizure rescue 10 tablet 1  . midodrine  (PROAMATINE ) 2.5 mg tablet Take 2.5 mg by mouth daily.    . mirabegron  (Myrbetriq ) 50 mg Tb24 Take 1 tablet (50 mg total) by mouth daily. 90 tablet 3  . nitroglycerin  (NITROSTAT ) 0.4 mg SL tablet Place 0.4 mg under the tongue as needed for chest pain.    . nystatin  (MYCOSTATIN ) 100,000 unit/gram ointment Apply topically as needed.    . pyridoxine  (VITAMIN B6) 100 mg tablet 1 tablet DAILY (route: oral)    . rosuvastatin  (CRESTOR ) 5 mg tablet 5 mg 3 (three) times a week.    . tamsulosin  (FLOMAX ) 0.4 mg cap  Take 1 capsule (0.4 mg total) by mouth daily. Will need an office appointment prior to any further refills. 90 capsule 3  . thiamine  (VITAMIN B1) 100 mg tablet Take 100 mg by mouth Once Daily.    . torsemide  (DEMADEX ) 20 mg tablet Take 10 mg by mouth daily.    . triamcinolone  (KENALOG ) 0.5 % cream Apply topically as needed.    SABRA allopurinoL (ZYLOPRIM) 300 mg tablet Take 1 tablet (300 mg total) by mouth daily. 30 tablet 3  . diclofenac sodium (VOLTAREN) 1 % gel Apply 1 g topically 3 (three) times a day. (Patient not taking: Reported on 03/15/2024) 90 g 0  . metoprolol  succinate (TOPROL  XL) 50 mg 24 hr tablet Take 0.5 tablets by mouth daily. (Patient not taking: Reported on 03/15/2024)    . traMADoL  25 mg tab Take 1 tablet (25 mg total) by mouth every 12 (twelve) hours as needed (Moderat pain (4-6)). 14 tablet 0   No current Epic-ordered facility-administered medications on file.  *Some images could not be shown.

## 2024-03-15 NOTE — Nursing Note (Signed)
 Per Norlene Ned, PA-C, ok to run PRBCs over 1 hour and platelets over 30 minutes.

## 2024-03-15 NOTE — Telephone Encounter (Signed)
 Writer contacted [employer] and [employer], PA-C message.  Wife acknowledged understanding, agreed and expressed thanks.  Wife states that they are just stepping off of the elevator and getting ready to check-in now.  Writer sent [place] message to [employer], PA-C to make aware.

## 2024-03-15 NOTE — Progress Notes (Signed)
 Drexler, Maland  ([id number])     DOB: [birth year]       BLOOD OR BLOOD COMPONENTS CONSENT FORM   HEMATOLOGY and ONCOLOGY      (This consent valid for 1 year for transfusions related to Hematology and Oncology disease management only.) SECTION A: CONSENT FOR THE TRANSFUSION OF BLOOD AND BLOOD DERIVED PRODUCTS/TREATMENT  TO BE COMPLETED BY THE PATIENT/LEGAL REPRESENTATIVE, PROVIDER AND A WITNESS*      In addition to methods to minimize blood loss and to optimize my own blood supply, I hereby authorize and consent to the     transfusion of all blood or blood derived products/treatment during the treatment of      at [facility]. (Patient's Name)        In addition to methods to minimize blood loss and to optimize my own blood supply, I hereby authorize and consent only to     blood derived products/treatment selected in Section B below during the treatment of      at [facility]. (Patient's Name)        I hereby refuse and DO NOT CONSENT to the transfusion of any blood, blood derived products/treatment listed in     Section B below during the treatment of                                                               at [facility].       (Patient's Name)       I hereby acknowledge that I understand the following:  1.   I understand that I need or may need a transfusion of blood or blood derived products/treatment in the interest of my health     and proper medical care. I understand what a transfusion is and the procedures that will be involved.    2.   Although the blood has been carefully tested, I understand there are possible risks such as unexpected blood reactions or     transmission of viral hepatitis, AIDS, and other infectious agents.    3.   I have selected blood derived products/treatment (see Section B below) that are acceptable to me if my physician  believes they     are appropriate. Risks of blood derived products/treatment may include high blood pressure, blood clots, and allergic  reactions.    4.   I understand that no guarantee as to the outcome of these blood conservation measures has been made.    5.   I understand that I may revoke this consent for a transfusion of blood or blood derived products/treatment at any time.    6.   I acknowledge that I have read this form or have had it read to me, that I understand it, and have had all my questions   answered.             SIGNATURE OF PROVIDER  PATIENT'S SIGNATURE / LEGAL REPRESENTATIVE DATE & TIME           DATE & TIME  Witness* (Witness signature is necessary only if patient signs with a mark (i.e. X) or if consent  obtained via phone.) DATE & TIME  INTERPRETER SIGNATURE / ID #  DATE:  TIME:  AM     PM        SECTION B: BLOOD DERIVED PRODUCTS/TREATMENT      Cryoprecipitate (blood derived product)   Erythropoietin (albumin free)   *Aranesp TM (not a blood derived product)        Clotting factors (blood derived product)   Other blood fractions (immune globulin, anti-venoms)        Intraoperative blood salvage (Cell saver device - a continuous circuit)   albumin        Other        * Erythropoietin / Aranesp may not be appropriate in Cancer patients    [id number] MR07/11;10/16 Chart Copy

## 2024-03-18 ENCOUNTER — Encounter (HOSPITAL_COMMUNITY): Payer: Self-pay

## 2024-03-18 ENCOUNTER — Telehealth: Payer: Self-pay

## 2024-03-18 NOTE — Telephone Encounter (Signed)
 Pt lab appt moved to 8am.   Juel VEAR Crawley, RN

## 2024-03-18 NOTE — Telephone Encounter (Signed)
 Called to follow-up on his visit at [facility].  Planning on PICC line and starting treatment for AML.  Patient has follow-up later this week for potential transfusion.  Let them know I just got the results of cytogenetics.  Will fax to [facility].  Expect once starting treatment, lab visit, transfusion will be expected in the near future.   Will follow up on Monday.  Beth would you fax cytogenetics to Dr. Raydell clinic at [facility]? thanks

## 2024-03-18 NOTE — Telephone Encounter (Signed)
 I called patient's wife and informed her of PICC line placement scheduled for 03/22/24 prior to appointment with us . She expressed understsanding.  Brea, Can you help me get his lab appointment scheduled prior to this so we can confirm he will not need platelets for procedure?  Thanks,  Norlene

## 2024-03-18 NOTE — Telephone Encounter (Addendum)
 Medical Records,  Can we request to see if [facility] can fax over NGS and Cytogenetics panel from Bone marrow results? They were still in process as of last week. Can we also request them to send slides over for our pathologist to review?   [facility] Phone #[phone]   Thanks, Hailey   Electronically signed by:  Norlene Candice Ned, PA-C, 03/18/2024 8:16 AM

## 2024-03-20 ENCOUNTER — Encounter: Payer: Self-pay | Admitting: Physician Assistant

## 2024-03-20 ENCOUNTER — Ambulatory Visit: Attending: Physician Assistant | Admitting: Physician Assistant

## 2024-03-20 VITALS — BP 104/66 | Ht 66.0 in | Wt 237.0 lb

## 2024-03-20 DIAGNOSIS — R079 Chest pain, unspecified: Secondary | ICD-10-CM | POA: Insufficient documentation

## 2024-03-20 DIAGNOSIS — I493 Ventricular premature depolarization: Secondary | ICD-10-CM | POA: Insufficient documentation

## 2024-03-20 DIAGNOSIS — Z950 Presence of cardiac pacemaker: Secondary | ICD-10-CM | POA: Diagnosis present

## 2024-03-20 DIAGNOSIS — I2510 Atherosclerotic heart disease of native coronary artery without angina pectoris: Secondary | ICD-10-CM | POA: Diagnosis present

## 2024-03-20 DIAGNOSIS — I340 Nonrheumatic mitral (valve) insufficiency: Secondary | ICD-10-CM | POA: Insufficient documentation

## 2024-03-20 DIAGNOSIS — I5032 Chronic diastolic (congestive) heart failure: Secondary | ICD-10-CM | POA: Insufficient documentation

## 2024-03-20 DIAGNOSIS — R0600 Dyspnea, unspecified: Secondary | ICD-10-CM | POA: Insufficient documentation

## 2024-03-20 DIAGNOSIS — I951 Orthostatic hypotension: Secondary | ICD-10-CM | POA: Diagnosis not present

## 2024-03-20 DIAGNOSIS — I77810 Thoracic aortic ectasia: Secondary | ICD-10-CM | POA: Diagnosis present

## 2024-03-20 DIAGNOSIS — I351 Nonrheumatic aortic (valve) insufficiency: Secondary | ICD-10-CM | POA: Insufficient documentation

## 2024-03-20 NOTE — Progress Notes (Signed)
 Remote PPM Transmission

## 2024-03-20 NOTE — Progress Notes (Signed)
 Cardiology Office Note    Date:  03/20/2024  ID:  KERIN CECCHI, DOB 1945/08/01, MRN [id number] PCP:  Geofm Glade PARAS, MD  Cardiologist:  Previously Dr. Maranda (Dr. Shlomo saw remotely once for OSA) Electrophysiologist:  Will Gladis Norton, MD   Chief Complaint: f/u orthostasis, dyspnea  History of Present Illness: .    Jeff Weaver is a 78 y.o. 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   He has a long history of atypical chest pain. Last cath 2019 showed minimal CAD, normal LVEF/LVEDP. He had 2nd degree AVB type 1 during cath prompting monitor 02/2018 which showed profound bradycardia, AVB with frequently nonconducted P waves, frequent PVCs (7%) and short runs of NSVT so underwent PPM implantation. Repeat echo 22022 demonstrated a decline in EF to 35% possibly due to RV pacing. Nuclear stress test at that time was negative for ischemia. He underwent upgrade to CRT-P in 03/2021 with subsequent normalization of LVEF. Though he's done reasonably well from a cardiac standpoint since that time, he has had several complex medical admissions. He's had progressive cognitive decline where notes indicate possible Lewy body dementia/Parkinsonism. In 2023 he had a fall with right hip fracture requiring replacement, rib fracture, subdural hematoma managed conservatively,  aspiration penumonitis, delirium, and UTI. Of note, wife [name] has previously requested not to specifically discuss dementia diagnosis with the patient as he would get upset and worked up about this. She is his primary caregiver and daughter Comer is also very involved. Medications were previously reduced due to orthostasis. He has not been on more aggressive doses of diuretic due to frequent urination, incontinence, and orthostasis. He saw Dr. Norton 12/2022 who noted histograms were flat so rate response was adjusted. GLP-1 previously deferred over concerns of memory issues/eating at different times of the night as well as potential worsening of baseline orthostasis. Last echo 10/2023 EF 55%, mild LVH of BSS, G1DD, mildly enlarged RV, mild PASP,  mild MR, moderate AI. Aortic root normal but ascending aorta not commented on, not outlined as enlarged (borderline dilated root/ascending aorta 09/2022). 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 He was treated with extended course of C-collar. Earlier in 2025 he had begun having more shuffling and confusion. He was starting another round of antibiotics for recurrence of UTI by outside office. Modified orthostatics showed sitting BP 105/69 -> 89/54 with standing. We trialed scheduled midodrine  - his family was concerned about development of hypertension during other times of the day so this was reduced to once daily. We later stopped metoprolol  due to flat HR response per discussion with Dr. Norton.   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 He continues to have episodic migratory focal  chest pains and lower extremity edema with similar descriptor as baseline. He has also noticed some increased dyspnea in the setting of his anemia. His family reports that his [facility] team suggested possibly updating his echocardiogram.  Labwork independently reviewed: 9/52025 Hgb 6.4, plt 30 -> being mg'd by heme-onc -> recheck 9/12 with Hgb 7.5, plt 17k, K 3.7, Cr 0.95, LFTs ok, Mg Mg 1.7 02/2024 BNP 160.5, LFTs ok, K 3.8, Cr 0.96 03/2023 LDL 63, trig 94  ROS: .    Please see the history of present illness.  All other systems are reviewed and otherwise negative.  Studies Reviewed: SABRA    EKG:  EKG is ordered today, personally reviewed, demonstrating   EKG Interpretation Date/Time:  Wednesday March 20 2024 11:20:14 EDT Ventricular Rate:  61 PR Interval:  190 QRS Duration:  118 QT Interval:  420 QTC Calculation: 422 R Axis:   184  Text Interpretation: Atrial-sensed ventricular-paced rhythm  Similar to prior Confirmed by Vannak Montenegro [phone]) on 03/20/2024 11:29:38 AM    CV Studies: Cardiac studies reviewed are outlined and summarized above. Otherwise please see EMR for full report.   Current Reported Medications:.    Current Meds  Medication Sig   acetaminophen  (TYLENOL ) 325 MG tablet Take 325-650 mg by mouth every 6 (six) hours as needed (pain/headaches.).   allopurinol (ZYLOPRIM) 300 MG tablet Take 300 mg by mouth daily.   benzonatate  (TESSALON ) 100 MG capsule Take 1 capsule (100 mg total) by mouth 3 (three) times daily as needed for cough.   carbidopa-levodopa (SINEMET IR) 25-100 MG tablet as directed.   cetirizine  (ZYRTEC ) 10 MG tablet Take 1 tablet (10 mg total) by mouth daily.   Cholecalciferol  (VITAMIN D ) 50 MCG (2000 UT) CAPS Take 2,000 Units by mouth in the morning.   clonazePAM (KLONOPIN) 1 MG disintegrating tablet Take 1 mg by mouth as directed. ALLOW 1 TABLET TO DISSOLVE IN CHEEK FOR SEIZURES LASTING LONGER THAN 4 MINUTES. OR AFTER 2ND SEIZURE IN 24 HOURS PRN    clotrimazole  (LOTRIMIN ) 1 % cream Apply 1 Application topically 2 (two) times daily.   donepezil  (ARICEPT ) 10 MG tablet Take half a tablet in the morning for a month and then increase to 1 full tablet daily.   finasteride (PROSCAR) 5 MG tablet Take 5 mg by mouth daily.   ipratropium (ATROVENT ) 0.06 % nasal spray Place 2 sprays into the nose as needed for rhinitis.   Iron , Ferrous Sulfate , 325 (65 Fe) MG TABS Take 1 tablet by mouth 2 (two) times daily.   ketoconazole  (NIZORAL ) 2 % cream Apply 1 Application topically daily as needed (for chest and axilla rash).   levETIRAcetam  (KEPPRA ) 500 MG tablet Take 3 tablets by mouth in the morning and at bedtime.   loperamide  (IMODIUM ) 2 MG capsule Take 1 capsule (2 mg total) by mouth as needed for diarrhea or loose stools.   LORazepam  (ATIVAN ) 1 MG tablet Take 1 mg by mouth every 6 (six) hours as needed for seizure.    Midazolam  (NAYZILAM ) 5 MG/0.1ML SOLN Place 1 each into the nose as needed (seizure).   midodrine  (PROAMATINE ) 2.5 MG tablet Take 1 tablet (2.5 mg total) by mouth every morning.   mirabegron  ER (MYRBETRIQ ) 50 MG TB24 tablet Take 50 mg by mouth daily.   mupirocin  ointment (BACTROBAN ) 2 % Apply 1 Application topically daily. For wound on left arm   nitroGLYCERIN  (NITROSTAT )  0.4 MG SL tablet PLACE 1 TABLET (0.4 MG TOTAL) UNDER THE TONGUE EVERY 5 (FIVE) MINUTES AS NEEDED FOR CHEST PAIN.   nystatin  ointment (MYCOSTATIN ) Apply 1 Application topically 2 (two) times daily.   Propylene Glycol (SYSTANE BALANCE) 0.6 % SOLN Apply 1 drop to eye daily as needed (dry eyes).   pyridOXINE  (VITAMIN B-6) 100 MG tablet Take 100 mg by mouth in the morning.   rosuvastatin  (CRESTOR ) 5 MG tablet Take 1 tablet (5 mg total) by mouth 3 (three) times a week.   Tamsulosin  HCl (FLOMAX ) 0.4 MG CAPS Take 0.4 mg by mouth every evening.   thiamine  100 MG tablet Take 100 mg by mouth in the morning.   torsemide  (DEMADEX ) 20 MG tablet TAKE 1 TABLET (20 MG) BY MOUTH ALTERNATING  WITH 1/2 TABLET (10 MG) BY MOUTH EVERY OTHER DAY.   triamcinolone  cream (KENALOG ) 0.5 % Apply 1 Application topically as needed (break out prevention).   VIMPAT  200 MG TABS tablet Take 200 mg by mouth 2 (two) times daily.   vitamin B-12 (CYANOCOBALAMIN ) 1000 MCG tablet Take 1,000 mcg by mouth in the morning.   [DISCONTINUED] AMOXICILLIN  PO Take by mouth daily.    Physical Exam:    VS:  BP 104/66   Ht 5' 6 (1.676 m)   Wt 237 lb (107.5 kg)   SpO2 95%   BMI 38.25 kg/m    Wt Readings from Last 3 Encounters:  03/20/24 237 lb (107.5 kg)  03/05/24 234 lb 11.2 oz (106.5 kg)  02/28/24 238 lb (108 kg)    GEN: Well nourished, well developed in no acute distress NECK: No JVD; No carotid bruits CARDIAC: RRR, no murmurs, rubs, gallops RESPIRATORY:  Clear to auscultation without rales, wheezing or rhonchi  ABDOMEN: Soft, non-tender, non-distended EXTREMITIES:  Soft 1+ BLE edema; No acute deformity   Asessement and Plan:.    1. Orthostasis - This may be a work in progress as he pursues chemotherapy. Family has deferred midodrine  for several days as BP has been OK at outside visits. Here, BP 104/66 with recheck 104/58. I would favor resumption of midodrine  2.5mg  daily as scheduled as a preventative measure for his orthostasis. He is at risk for falls going forward. Family previously had concerns about HTN later in the afternoon, hence once-daily dosing.   2. Dyspnea, chronic HFimpEF with chronic edema - family inquires about updated echo, indicates that [facility] team suggested to obtain. Will arrange. My hope is that this will be stable compared to April. BNP historically fairly low. Suspect edema is chronic venous insufficiency from predominantly seated state, gravity. Continue torsemide  10mg  alternating with 20mg  every other day. Has not previously been on higher doses due to incontinence and orthostasis. We discussed velcro compression stockings at last visit, as well as elevation. I also  suspect his anemia/leukemia is playing a role in his dyspnea.  3. Mild CAD - migratory episodic chest pain carries similar characteristics as before. Historically suspected noncardiac chest pain. Troponins negative in 02/2024. Cath 2019 reassuring, nuc 2022 reassuring. Risk of repeat workup for atypical symptoms is higher than benefit, especially given ongoing leukemia concerns since he is not a candidate even for a simple baby aspirin .  4 .H/o PVCs/PPM - last device interrogation 02/2024 felt to be stable per Dr. Inocencio. No ectopy on exam or EKG. Continue EP surveillance.  5. Moderate AI, mild MR, mild dilation of aorta - repeat echocardiogram requested above, will follow.    Disposition: They would like to establish  with Dr. Lonni in the [place] location as it is easier for them to navigate to that office. He was a prior patient of Dr. Maranda (really only saw Dr. Shlomo once in 2022 in setting of OSA). They were hoping to arrange a sooner follow-up while he is navigating these above issues. Will request next available follow-up.  Signed, Niguel Moure N Christopher Glasscock, PA-C

## 2024-03-20 NOTE — Patient Instructions (Signed)
 Medication Instructions:  Your physician recommends that you continue on your current medications as directed. Please refer to the Current Medication list given to you today.  *If you need a refill on your cardiac medications before your next appointment, please call your pharmacy*   Testing/Procedures: Your physician has requested that you have an echocardiogram. Echocardiography is a painless test that uses sound waves to create images of your heart. It provides your doctor with information about the size and shape of your heart and how well your heart's chambers and valves are working. This procedure takes approximately one hour. There are no restrictions for this procedure. Please do NOT wear cologne, perfume, aftershave, or lotions (deodorant is allowed). Please arrive 15 minutes prior to your appointment time.  Please note: We ask at that you not bring children with you during ultrasound (echo/ vascular) testing. Due to room size and safety concerns, children are not allowed in the ultrasound rooms during exams. Our front office staff cannot provide observation of children in our lobby area while testing is being conducted. An adult accompanying a patient to their appointment will only be allowed in the ultrasound room at the discretion of the ultrasound technician under special circumstances. We apologize for any inconvenience.   Follow-Up: At [facility], you and your health needs are our priority.  As part of our continuing mission to provide you with exceptional heart care, our providers are all part of one team.  This team includes your primary Cardiologist (physician) and Advanced Practice Providers or APPs (Physician Assistants and Nurse Practitioners) who all work together to provide you with the care you need, when you need it.  Your next appointment:   1 month(s) or next available   Provider:   Shelda Bruckner, MD

## 2024-03-21 ENCOUNTER — Ambulatory Visit (HOSPITAL_COMMUNITY)

## 2024-03-25 ENCOUNTER — Inpatient Hospital Stay (HOSPITAL_BASED_OUTPATIENT_CLINIC_OR_DEPARTMENT_OTHER)

## 2024-03-25 VITALS — BP 130/61 | HR 62 | Temp 97.5°F | Resp 13 | Wt 240.9 lb

## 2024-03-25 DIAGNOSIS — D6182 Myelophthisis: Secondary | ICD-10-CM

## 2024-03-25 DIAGNOSIS — C9200 Acute myeloblastic leukemia, not having achieved remission: Secondary | ICD-10-CM | POA: Insufficient documentation

## 2024-03-25 DIAGNOSIS — D696 Thrombocytopenia, unspecified: Secondary | ICD-10-CM | POA: Insufficient documentation

## 2024-03-25 NOTE — Assessment & Plan Note (Addendum)
 Hematuria resolved Will schedule twice weekly lab check, and transfusion if needed after his discharge from [facility]

## 2024-03-25 NOTE — Assessment & Plan Note (Addendum)
 planning to start C1 of single agent azacitidine on 03/26/24 at [facility]. Continue allopurinol Neutropenic, thrombocytopenic precautions

## 2024-03-25 NOTE — Assessment & Plan Note (Addendum)
 Will schedule twice weekly lab check, and transfusion if needed after his discharge from [facility]

## 2024-03-25 NOTE — Progress Notes (Signed)
  Cancer Center OFFICE PROGRESS NOTE  Patient Care Team: Jeff Glade PARAS, MD as PCP - General (Internal Medicine) Jeff Soyla Lunger, MD as PCP - Electrophysiology (Cardiology) Jeff Wilbert SAUNDERS, MD as PCP - Cardiology (Cardiology) Jeff Weaver, Jeff SAUNDERS, PA-C (Neurology) Sam, Jeff BROCKS, MD as Referring Physician (Neurology) Narvis Larger, MD as Physician Assistant (Urology) Therman Darice HERO, CCC-SLP as Speech Language Pathologist (Speech Pathology) Robinson Mayo, OD as Referring Physician (Optometry)  78 y.o.male with history of cerebral aneurysm s/p clipping in 02/2012 c/b memory loss, cerebral hemorrhage and seizure disorder s/p VP shunt, heart failure, CAD, hypertension, pacemaker placement for bradycardia and AV block, sleep apnea, pnacreatic cyst, obesity, SDH due to fall 07/2021, and Lewy body dementia referred to Hematology for anemia and thrombocytopenia found to have acute leukemia.  Marrow biopsy showed AML. (approximately 20- 25% of cellularity by CD34 immunohistochemical analysis) with complex cytogenetics.  Patient is planning C1 of single agent azacitidine on 03/26/24.   Patient had a good discussion with Dr. Perri and his team regarding treatment plan and prognosis.  I informed him as well that the necessity of ongoing monitoring, potential need for transfusion twice weekly after starting treatment.  There will be a lot of visits in and out of the clinic.  Potential risks of emergency room visit, hospitalization for infection, bleeding etc. He understands.  Patient and family expressed for their convenience, would like to follow-up here if stable after cycle 1 and with transfusions.  I let them know I am subspecializing in GU oncology and skin malignancies.  I told them that I would need expertise opinion from Dr. Perri regarding his care.  He will finish day 7 of azacitidine next Tuesday.  Daughter Jeff Weaver will let us  know if they are going to need us  to schedule cycle 2 here.  Will  forward the note to Dr. Perri.  I am very appreciative of him getting [name] in so quickly. Assessment & Plan Acute myeloid leukemia not having achieved remission (HCC) planning to start C1 of single agent azacitidine on 03/26/24 at [facility]. Continue allopurinol Neutropenic, thrombocytopenic precautions Myelophthisic anemia (HCC) Will schedule twice weekly lab check, and transfusion if needed after his discharge from [facility] Thrombocytopenia Hematuria resolved Will schedule twice weekly lab check, and transfusion if needed after his discharge from [facility]  Orders Placed This Encounter  Procedures   CBC with Differential (Cancer Center Only)    Standing Status:   Standing    Number of Occurrences:   50    Expiration Date:   03/25/2025   Sample to Blood Bank    Standing Status:   Standing    Number of Occurrences:   50    Next Expected Occurrence:   03/26/2024    Expiration Date:   03/25/2025     Pauletta Weaver Chihuahua, MD  INTERVAL HISTORY: Patient returns for follow-up.  Daughter and wife are with him today.  Overall stable.  Report hematuria on Thursday and Friday and resolved over the weekend.  No other bleeding.  No fever or other changes.  Clinically stable.  Chronic stomach pain is about the same on the left side.  PHYSICAL EXAMINATION: ECOG PERFORMANCE STATUS: 3 - Symptomatic, >50% confined to bed  Vitals:   03/25/24 1329  BP: 130/61  Pulse: 62  Resp: 13  Temp: (!) 97.5 F (36.4 C)  SpO2: 98%   Filed Weights   03/25/24 1329  Weight: 240 lb 14.4 oz (109.3 kg)    GENERAL:  alert, no distress and comfortable on wheelchair SKIN: skin color normal and no jaundice or petechiae on exposed skin LUNGS: clear to auscultation and no wheeze or rales with normal breathing effort HEART: regular rate & rhythm  ABDOMEN: abdomen soft Musculoskeletal: Chronic bilateral ankle edema   Relevant data reviewed during this visit included labs.  New labs ordered.

## 2024-03-26 ENCOUNTER — Ambulatory Visit: Payer: Self-pay

## 2024-03-26 ENCOUNTER — Encounter (HOSPITAL_COMMUNITY): Payer: Self-pay

## 2024-03-26 ENCOUNTER — Ambulatory Visit

## 2024-03-26 NOTE — Telephone Encounter (Signed)
-----   Message from Pauletta JAYSON Chihuahua sent at 03/26/2024  2:41 PM EDT ----- Zorita would you fax this to Dr. Atha office at [facility] the AML team? Thanks  ----- Message ----- From: Burnetta Madelin GRADE Sent: 03/26/2024  12:24 PM EDT To: Pauletta JAYSON Chihuahua, MD

## 2024-03-26 NOTE — Telephone Encounter (Signed)
 Path reports faxed to Dr Perri. [phone]

## 2024-03-31 NOTE — Progress Notes (Signed)
 Subjective:   HPI:  Jeff Weaver is a 78 y.o. male w/ PMH of PD, stroke, Htn, HLD, HFrEF, seizures, hydrocephalus s/p Codman Hakim VPS (last set at 180 cm H2O) managed by Dr. Fargen and Wyvonna Nett, right ACA cerebral aneurysm s/p clipping w/ [facility] in 2013, & nondisplaced lateral mass Fx of C2 after fall 4.9.25, who presents today for 2 month reevaluation of his low back pain after PT.  Today, he is accompanied by his wife [name] and his daughter [name].  States the back and neck are doing a little bit better.  Which really bothering him today is his knees.  He has been unable to get up and move around because the knee pain has been so bad recently.  It is a burning bilateral pain that is worse in the left knee than the right.  He has not been to see orthopedics about this.    His last visit he has been diagnosed with AML and he is thrombocytopenic.  He cannot have any injections until his platelet count gets above 20.  The following portions of the patient's history were reviewed and updated as appropriate: allergies, current medications, past family history, past medical history, past social history, past surgical history and problem list.  Review of Systems: Review of Systems Reviewed Systems including Constitutional, HEENT, Eyes, Respiratory, Cardiovascular, gastrointestinal, endocrine, GU, musculoskeletal, skin, allergy /immune, Neurologic, Hematologic, and psychiatric. Positive for: see HPI  Objective:  Physical Exam: Neurological Exam Physical Exam Alert and oriented.  No acute distress.  Obese body habitus.  5 out of 5 strength in the right leg.  In the left leg he is 4 out of 5.  No sensory deficits.  Patient does have some tenderness to palpation in the lumbosacral spine on the right paraspinal region but no SI joint pain or midline pain.  Will to assess gait as he is wheelchair-bound today.  Imaging:  Assessment:  Assessment   1. Chronic low back pain without sciatica,  unspecified back pain laterality  Ambulatory referral to Orthopedic Surgery   Ambulatory referral to Home Health    2. Bilateral chronic knee pain  Ambulatory referral to Orthopedic Surgery   Ambulatory referral to Home Health    3. Thrombocytopenia  Ambulatory referral to Orthopedic Surgery    4. Physical deconditioning  Ambulatory referral to Home Health    5. Acute myeloid leukemia not having achieved remission    (CMD)         Pleasant 78 year old male seen today for follow-up of his neck and low back pain that seems to be a little bit better controlled now than it was previously.  His family has been using the topical cream prescribed previously for his knees but is not helping.  We discussed trying other topical ointments.  As for the knees we will place a referral to get him into see Dempsey Redbird in our orthopedic team.     Plan:  Plan   #Referral to orthopedic surgery with Dempsey Redbird for bilateral knee pain. #Follow-up with me as needed for the neck and back.  On the day of the visit I spent 30 minutes preparing to see the patient, performing a medically appropriate examination and evaluation, counseling and educating the patient/caregiver, referring and communicating with other health care professionals (not separately reported), and documenting clinical information in the electronic medical record.This time does not include any time spent performing procedures or assesments that are separately billable.

## 2024-04-01 ENCOUNTER — Telehealth: Payer: Self-pay | Admitting: Cardiology

## 2024-04-01 ENCOUNTER — Telehealth: Payer: Self-pay

## 2024-04-01 NOTE — Telephone Encounter (Signed)
 Left message to call back  Scheduled patient appointment for Friday 10/3 at 2:20 pm

## 2024-04-01 NOTE — Telephone Encounter (Signed)
 Wife called to cancel all of the patient's appointments. They have decided to go to  [facility] per recommendation from Dr Tina. They wish to keep the appointment with Dr Tina on the 23 rd of October. Arland Legions BSN RN

## 2024-04-01 NOTE — Telephone Encounter (Signed)
 Daughter would like to speak to the nurse for concerns with getting the patient an appt. Please advise

## 2024-04-01 NOTE — Telephone Encounter (Signed)
 Pt daughter call back and confirmed appt for 04/05/24 and that they will be there. She is grateful for this appt.

## 2024-04-02 NOTE — Progress Notes (Addendum)
 Hematology and Oncology Follow Up Visit   Jeff Weaver: Jeff Weaver Date: 04/02/2024 MRN: [id number]   Oncology History  MDS (myelodysplastic syndrome)    (CMD) (Resolved)  Acute myeloid leukemia not having achieved remission    (CMD)  03/08/2024 Initial Diagnosis   Acute myeloid leukemia not having achieved remission - OSH Bmbx with hypercellular bone marrow (90%) involved by AML with 20-25% blasts by CD34  Cytogenetics complex with -7   03/26/2024 -  Oncology Treatment   Protocol AzaCITIDine IV - Every 28 Days  Chemotherapy/Immunotherapy Medications azaCITIDine (VIDAZA) 170 mg in sodium chloride  0.9 % 117 mL chemo IVPB, 75 mg/m2 = 170 mg, intravenous Administration: 170 mg (03/26/2024), 170 mg (03/29/2024), 170 mg (03/27/2024), 170 mg (03/28/2024), 170 mg (03/30/2024), 170 mg (04/01/2024)  [Plan is still active]   03/26/2024 -  Chemotherapy   C1 Vidaza 75mg /m2 IV daily x 7 days      Local oncologist: Dr. Pauletta Tina [facility]  P [phone]     Current Status: I had the pleasure of following up with Jeff Weaver in clinic today for Dr. Perri.  He was last seen by us  last week. In the interm, Jeff Weaver had a fairly good weekend. His family notes he did okay and they didn't notice any acute issues. He does seem more fatigued and tired with activity. He remains SOB with activity but breathing seems stable. Rash remains present on chest and wife doesn't think rash is very different from last week. He continues to cough which is persistent and is mainly around meals and at night. Daughter is inquiring about the possibility of getting knee injections as she thinks that knee pain is making it more difficult for Jeff Weaver to ambulate. Luckily he has not had any more hematuria.   Jeff Weaver denies any fevers, chills, sweats, or concerning lymphadenopathy.  He also denies any infectious symptoms.  Jeff Weaver denies any chest pain, heart palpitations, or woresning peripheral edema.   He also denies any nausea, vomiting, constipation, diarrhea, abdominal pain or other bowel changes.  Jeff Weaver denies any epistaxis, gum bleeding, noticeable hematuria, melena, or hematochezia.  He denies any urinary signs or symptoms as well as any neurological signs or symptoms.    ECOG/Zubrod Performance Status: 3 - Capable of only limited selfcare; confined to bed or chair > 50 percent of waking hours KPS (40-30%)   A Complete ROS was performed and negative if not mentioned above in the current status.  Past Medical History: Medical History[1]  Past Surgical History: Surgical History[2]  Medications:  Current Outpatient Medications  Medication Instructions  . acetaminophen  (TYLENOL ) 650 mg  . allopurinoL (ZYLOPRIM) 300 mg, oral, Daily  . benzonatate  (TESSALON ) 200 mg capsule TAKE 1 CAPSULE BY MOUTH 3 TIMES DAILY AS NEEDED FOR UP TO 10 DAYS FOR COUGH.  . carbidopa-levodopa (SINEMET) 25-100 mg per tablet 1.5 tablets, oral, 3 times daily  . cetirizine  (ZYRTEC ) 10 mg, Daily  . cholecalciferol  (VITAMIN D3) 2,000 unit cap capsule 1 capsule DAILY (route: oral)  . clonazePAM (KLONOPIN) 1 mg  . clotrimazole  (LOTRIMIN ) 1 % cream 1 Application, As needed  . cyanocobalamin  (VITAMIN B12) 1,000 mcg, oral, Daily  . donepeziL  (ARICEPT ) 10 mg, oral, Daily  . Fiber (calcium  polycarbophil) 625 mg, oral, 3 times daily  . finasteride (PROSCAR) 5 mg, oral, Daily  . ipratropium (ATROVENT ) 42 mcg (0.06 %) spry nasal spray 2 sprays, 4 times daily PRN  . levETIRAcetam  (KEPPRA ) 1,500 mg, oral, 2 times daily  .  loperamide  (IMODIUM ) 2 mg  . LORazepam  (ATIVAN ) 1 mg, oral, Every 8 hours PRN, For seizure rescue  . midodrine  (PROAMATINE ) 2.5 mg, Daily  . mirabegron  (MYRBETRIQ ) 50 mg, oral, Daily  . nitroglycerin  (NITROSTAT ) 0.4 mg, As needed  . nystatin  (MYCOSTATIN ) 100,000 unit/gram ointment As needed  . ondansetron  (ZOFRAN -ODT) 8 mg, oral, Every 8 hours PRN  . pyridoxine  (VITAMIN B6) 100 mg tablet 1  tablet DAILY (route: oral)  . rosuvastatin  (CRESTOR ) 5 mg, 3 times weekly M,W,F  . tamsulosin  (FLOMAX ) 0.4 mg, oral, Daily, Will need an office appointment prior to any further refills.  . thiamine  (VITAMIN B1) 100 mg, Daily  . torsemide  (DEMADEX ) 10 mg, Daily  . traMADoL  (ULTRAM ) 50 mg, oral, Daily PRN  . triamcinolone  (KENALOG ) 0.5 % cream As needed  . Vimpat  200 mg, oral, 2 times daily     Allergies: Allergies[3]  Physical Exam: Review Flowsheet  More data exists      03/29/2024 03/30/2024 04/01/2024 04/02/2024  ONCBCN RECENT VITALS  Weight 107.956 kg - 106.867 kg 107.004 kg  BSA (Calculated - sq m) 2.19 m2 - 2.18 m2 2.18 m2  Temp 98 F (36.7 C) 97 F (36.1 C) 97.9 F (36.6 C) 97.1 F (36.2 C) 97.7 F (36.5 C) 97.5 F (36.4 C) 98.2 F (36.8 C) 98 F (36.7 C) 97.3 F (36.3 C) 97.7 F (36.5 C) 97.3 F (36.3 C) 98.3 F (36.8 C)  BP 143/61 112/53* 123/53* 116/41* 142/57* 118/52* 129/56* 142/60 130/54* 144/65 135/57* 146/64  Heart Rate 61 62 60 68 65 64 65 60 63 61  64 63  Respiratory Rate 18 18 18 16 18 16 18 18 20 18 18 19   Oxygen Saturation 93 % 95 % 94 % 96 % 91 % 93 % 95 % 99 % 96 % 97 % 97 %  Oxygen Device None (Room air) None (Room air) None (Room air) None (Room air) None (Room air) - - -    GENERAL: In general Jeff Weaver is a pleasant but chronically ill appearing 78 yo male who is well developed, well nourished in no acute distress.  Alert and oriented x 3.  Ambulatory without assistance.  HEENT: Head normocephalic, atraumatic. PERRL. Sclera anicteric. Nares and oropharynx without lesions or exudate.  Mucous membranes pink, moist, and intact. No thrush or lesions noted. LYMPHATIC: No cervical, supraclavicular, or axillary adenopathy CARDIOVASCULAR: Regular rate and rhythm without any murmurs, rubs or gallops. PULM:equal and clear breath sounds bilaterally without anywheezes, rhonchi or crackles. Diminished lower lobe sounds noted.  ABDOMEN: Bowel sounds present  throughout.  Soft, nontender, nondistended. No palpable hepatosplenomegaly noted. EXTREMITIES: Showed no clubbing, cyanosis or edema noted. 5/5 strength noted throughout. SKIN: Diffuse red rash noted on anterior chest and abdomen. Scattered bruising noted on BUE. Right arm PICC line intact without erythema, edema, tenderness, or drainage.  Clean dry dressing intact.  NEUROLOGIC: Nonfocal, gait normal and strength intact.    Labs: Recent Results (from the past 48 hours)  Comprehensive Metabolic Panel   Collection Time: 04/02/24 12:02 PM  Result Value Ref Range   Sodium 136 136 - 145 mmol/L   Potassium 3.9 3.4 - 4.5 mmol/L   Chloride 99 98 - 107 mmol/L   CO2 34 (H) 21 - 31 mmol/L   Anion Gap 3 (L) 6 - 14 mmol/L   Glucose, Random 118 (H) 70 - 99 mg/dL   Blood Urea Nitrogen (BUN) 25 7 - 25 mg/dL   Creatinine 8.98 9.29 - 1.30 mg/dL  eGFR 76 >59 mL/min/1.7m2   Albumin 3.9 3.5 - 5.7 g/dL   Total Protein 6.4 6.4 - 8.9 g/dL   Bilirubin, Total 0.8 0.3 - 1.0 mg/dL   Alkaline Phosphatase (ALP) 47 34 - 104 U/L   Aspartate Aminotransferase (AST) 14 13 - 39 U/L   Alanine Aminotransferase (ALT) <5 (L) 7 - 52 U/L   Calcium  9.1 8.6 - 10.3 mg/dL   BUN/Creatinine Ratio    Lactate Dehydrogenase (LDH)   Collection Time: 04/02/24 12:02 PM  Result Value Ref Range   Lactate Dehydrogenase (LDH) 261 140 - 271 U/L  Uric Acid   Collection Time: 04/02/24 12:02 PM  Result Value Ref Range   Uric Acid 5.8 4.4 - 7.6 mg/dL  Magnesium    Collection Time: 04/02/24 12:02 PM  Result Value Ref Range   Magnesium  1.8 (L) 1.9 - 2.7 mg/dL  Blood Bank Specimen Hold   Collection Time: 04/02/24 12:02 PM  Result Value Ref Range   AH Hold Specimen for Add-Ons Auto Resulted.   CBC with Differential   Collection Time: 04/02/24 12:02 PM  Result Value Ref Range   WBC 3.10 (L) 4.40 - 11.00 10*3/uL   RBC 2.70 (L) 4.50 - 5.90 10*6/uL   Hemoglobin 8.0 (L) 14.0 - 17.5 g/dL   Hematocrit 77.1 (L) 58.4 - 50.4 %   Mean  Corpuscular Volume (MCV) 84.6 80.0 - 96.0 fL   Mean Corpuscular Hemoglobin (MCH) 29.8 27.5 - 33.2 pg   Mean Corpuscular Hemoglobin Conc (MCHC) 35.2 33.0 - 37.0 g/dL   Red Cell Distribution Width (RDW) 15.3 12.3 - 17.0 %   Platelet Count (PLT) 9 (LL) 150 - 450 10*3/uL   Mean Platelet Volume (MPV) 7.6 6.8 - 10.2 fL   Neutrophils % 73 %   Lymphocytes % 19 %   Monocytes % 6 %   Eosinophils % 2 %   Basophils % 0 %   nRBC % 0 %   Neutrophils Absolute 2.20 1.80 - 7.80 10*3/uL   Lymphocytes # 0.60 (L) 1.00 - 4.80 10*3/uL   Monocytes # 0.20 0.00 - 0.80 10*3/uL   Eosinophils # 0.10 0.00 - 0.50 10*3/uL   Basophils # 0.00 0.00 - 0.20 10*3/uL   nRBC Absolute 0.00 <=0.00 10*3/uL   RBC & PLT Morphology Reviewed    Ovalocytes 1+   Type and screen   Collection Time: 04/02/24 12:02 PM  Result Value Ref Range   Component Type RED CELLS    Crossmatch Expiration 04-05-2024,2359    Type & Rh (ABORH) A Positive    Antibody Screen NEG   Prepare RBC (Sunquest Info)   Collection Time: 04/02/24 12:02 PM  Result Value Ref Range   Unit Number [id number]    Component Type LEUKOREDUCED IRRADIATED RBC    Blood Unit Division 00    Dispense Status ISSUED    Unit Transfusion Status OK TO TRANSFUSE    Crossmatch Electronically Compatible    Blood Issue/Date Time [id number]    Blood Product Code [id number]    Unit ABO/Rh A POS    Blood Type 6200    Product Expiration Date 202510222359   Prepare Platelets  : 1 Units   Collection Time: 04/02/24 12:48 PM  Result Value Ref Range   Unit Number [id number]    Component Type PATH RED PLT 2 CONT    Blood Unit Division 00    Dispense Status ISSUED    Unit Transfusion Status OK TO TRANSFUSE    Blood Issue/Date Time [id number]  Blood Product Code [id number]    Unit ABO/Rh A POS    Blood Type 6200    Product Expiration Date 797489987640      Impression and Plan:  Impression Plan  Acute Myeloid Leukemia diagnosed 03/08/24 on OSH Bmbx with   hypercellular marrow (90%) with 20-25% blasts by CD34 and 10% blasts by flow cytometry. Cytogenetics complex with -7.    Day 8 (treatment day 7) of C1 Vidaza 75mg /m2 IV daily x 7 days. Jeff Weaver is tolerating therapy fairly well overall. Counts remain stable with continued anemia, thrombocytopenia. Received last day of treatment today. Will then RTC for labs twice weekly on Tuesdays/Fridays with supportive transfusions as needed. Will see Jeff Weaver twice weekly as well for close follow up and monitoring. Will plan to complete two cycles before repeating bone marrow biopsy. Will see Jeff Weaver as scheduled on Friday 04/05/2024.  Jeff Weaver and his family expressed agreement and understanding of plan.    Transfusions   - 1 unit PRBCs for Hgb 8.0. 1 unit of platelets given for plt count of 9k.     - Hgb goal >8.0  and Plt goal> 20k  OF note, will plan for Jeff Weaver to take home Torsemide  10mg  PO when coming here and receiving transfusions.     Transplant   - Due to multiple comorbidities and Jeff Weaver's age, not a candidate for transplant at this time.     ID   - Afebrile and not neutropenic. Knows to call with signs and symptoms of infection or with Temp >100.4    - headache with slight cough with sore throat. RVP obtained and negative.     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 Continues torsemide  but takes full dose 10 mg daily on MWF and uses 5mg  daily all other days. Discussed with Dr. Perri and will try to defer Lasix  use today and let Jeff Weaver take his normal dose torsemide  10mg  when Jeff Weaver comes here. Will closely monitor fluid status and add IV lasix  back if needed.     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   - CAD with long history of atypical chest pain. Nitrostat  PRN   - Follows with Dayna Dunn PA-C and Dr. Soyla Lunger At [facility]. Last seen 12/14/2023.   - Jeff Weaver wanted referral to establish with cardiologist at [facility]. Scheduled 08/23/2024.     Pulm     - some DOE with coughing while eating noted by wife. Chest xray 03/22/2024 negative for acute findings.     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 Will send message to Dr. Zamora and D. Thinganot to see if they can follow Jeff Weaver as family thinks they were recommended by Dr. Genevie. There was mention of sinemet dose needing to be adjusted.    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 Genevie. Last seen 11/14/2023.    GI   - remote history of Crohn's disease   - history of adenomatous polyps   - GERD   - Last EGD/Colonoscopy 02/25/2020   - Nonspecific left sided chronic pain. Denies constipation or bleeding issues. Will continue to monitor closely. If persists may need further cardiac work-up. Referral sent to cardiology to establish care.    - Intermittent diarrhea which is self limiting. Per wife, in the past he has used loperamide  when this occurs. Also has fiber to use if needed.     GU   - BPH, continues flomax  0.4 mg every day and finasteride 5mg  every day.   -Urinary incontinence, continues Mirbetriq 50 mg every day   - Hx hematuria,  recommended CT urogram and cystoscopy but has not yet been scheduled. Recent hematuria likely due to low  platelets. U/a and urine culture negative. Will monitor closely and will need to see uro prior to 01/2025.    -Follows with Delon [facility]ner [facility] NP, last seen 01/03/24    MSK   - Notes bilateral knee pain, takes tylenol  PRN. Sent in low dose tramadol  25 mg to only be used a max of BID PRN. Daughter asked about knee injection and we would need platelets to be better.   - Jeff Weaver having trouble getting up and down from seated position. Family is asking for a lift chair. Discussed with social work.     Derm   - rash noted over anterior torso with marked dry skin. Now rash is more over abdomen. Unclear if rash is related to allopurinol. Will continue to use Eucerin ointment for hydration. If this persists or worsens, will consider stopping allopurinol.     FEN   - TLX ppx; taking allopurinol daily  Access   - Right arm DL PICC line placed 90/80/7974 with blood clot at insertion site noted but otherwise looks healthy. Will monitor insertion site closely.         Lauraine Casimir Harman, NP9/30/202511:48 AM    For Dr. Perri  I have reviewed history, findings, assessment, and plan. I have discussed with Jeff Weaver and with APP.  I agree with plan. No further hematuria. We discussed his diuresis, especially on days he comes to clinic for probable transfusions, and developed a plan with family and Jeff Weaver.We reviewed his upcoming plans and schedule.  Electronically signed by: Karie LITTIE Perri, MD 04/03/2024 4:29 PM              [1] Past Medical History: Diagnosis Date  . Acute myeloid leukemia    (CMD) 03/08/2024  . Aftercare following surgery of the circulatory system, NEC 04/27/2012  . Aftercare following surgery of the nervous system, NEC 04/27/2012  . 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 Entropion of right lower eyelid   . Hearing loss   . Hyperlipidemia   . Hypertension   . OSA (obstructive sleep apnea) 01/26/2012  . Parkinsonism    (CMD)   . Polymyalgia (CMD)   .  Seizures    (CMD)   [2] Past Surgical History: Procedure Laterality Date  . CATARACT EXTRACTION W/  INTRAOCULAR LENS IMPLANT Right 03/30/2016   Procedure: CATARACT EXTRACTION W/  INTRAOCULAR LENS IMPLANT; SN60WF 20.5 D  . CATARACT EXTRACTION W/  INTRAOCULAR LENS IMPLANT Left 04/06/2016   Procedure: CATARACT EXTRACTION W/  INTRAOCULAR LENS IMPLANT; SN60WF 20.0 D  . CEREBRAL ANEURYSM REPAIR  02/02/2012  Procedure: CRANIOTOMY FOR ANEURYSM;  Surgeon: Dino Sharleen Sable, MD;  Location[facility] MAIN OR;  Service: Neurosurgery;  Laterality: N/A;  Craniotomy for clipping aneurysm  . [facility] REPAIR Right 10/29/2012   Procedure: ENTROPIAN REPAIR-right eye;  Surgeon: Lamar Belvie Seashore, MD;  Location: [facility] OUTPATIENT OR;  Service: Ophthalmology;  Laterality: Right;  . HEMIARTHROPLASTY HIP Right 07/15/2021   Procedure: Right hip hemiarthroplasty - DePuy Actis - 07/15/21;  Surgeon: Montie Macario Cable, MD;  Location: [facility] MAIN OR;  Service: Orthopedics;  Laterality: Right;  . INGUINAL HERNIA REPAIR  bilateral 1998   Procedure: INGUINAL HERNIA REPAIR  . NASAL SINUS SURGERY  ~ 1998   Procedure: NASAL SINUS SURGERY  . PACEMAKER LASER LEAD EXTRACTION     Procedure: PACEMAKER LASER LEAD EXTRACTION  . SHOULDER SURGERY  right  1997   Procedure: SHOULDER SURGERY  . VENTRICULOPERITONEAL SHUNT  02/23/2012   Procedure: VP SHUNT (VENTRICULO-PERITONEAL);  Surgeon: Dino Sharleen Sable, MD;  Location: [facility] MAIN OR;  Service: Neurosurgery;  Laterality: N/A;  E-2     [3] Allergies Allergen Reactions  . Haloperidol Rash and Other (See Comments)    Other reaction(s): Other, Hallucinations,   . Haloperidol Lactate   . Lisinopril Cough    Cough  . Nifedipine  Other (See Comments)    Thought to contribute to lower extremity edema  *Some images could not be shown.

## 2024-04-03 ENCOUNTER — Other Ambulatory Visit: Payer: Medicare Other

## 2024-04-03 ENCOUNTER — Other Ambulatory Visit (HOSPITAL_BASED_OUTPATIENT_CLINIC_OR_DEPARTMENT_OTHER)

## 2024-04-03 NOTE — Telephone Encounter (Addendum)
 Dr. Hans and Dr. Lori- this patient was following with Dr. Genevie for multiple neuro problems. There was discussion of adjusting sinemet dose per family. It appears that patient is scheduled to follow up with Dr. Rosalia but patient family is under the understanding that Dr. Rosalia does more surgeries. Family said both of you were mentioned by Dr. Genevie as far as providers who may follow up LBD and sinemet management. Should patient follow with either one of you instead? - Electronically signed by: Lauraine Casimir Harman, NP 04/03/2024 3:29 PM

## 2024-04-05 ENCOUNTER — Encounter (HOSPITAL_BASED_OUTPATIENT_CLINIC_OR_DEPARTMENT_OTHER): Payer: Self-pay | Admitting: Cardiology

## 2024-04-05 ENCOUNTER — Encounter

## 2024-04-05 ENCOUNTER — Other Ambulatory Visit

## 2024-04-05 ENCOUNTER — Ambulatory Visit (INDEPENDENT_AMBULATORY_CARE_PROVIDER_SITE_OTHER): Admitting: Cardiology

## 2024-04-05 VITALS — BP 119/71 | HR 80 | Ht 68.5 in | Wt 235.6 lb

## 2024-04-05 DIAGNOSIS — R0602 Shortness of breath: Secondary | ICD-10-CM | POA: Diagnosis not present

## 2024-04-05 DIAGNOSIS — I351 Nonrheumatic aortic (valve) insufficiency: Secondary | ICD-10-CM

## 2024-04-05 DIAGNOSIS — I2510 Atherosclerotic heart disease of native coronary artery without angina pectoris: Secondary | ICD-10-CM | POA: Diagnosis not present

## 2024-04-05 DIAGNOSIS — I951 Orthostatic hypotension: Secondary | ICD-10-CM | POA: Diagnosis not present

## 2024-04-05 DIAGNOSIS — I5020 Unspecified systolic (congestive) heart failure: Secondary | ICD-10-CM

## 2024-04-05 DIAGNOSIS — R600 Localized edema: Secondary | ICD-10-CM | POA: Diagnosis not present

## 2024-04-05 DIAGNOSIS — I340 Nonrheumatic mitral (valve) insufficiency: Secondary | ICD-10-CM

## 2024-04-05 NOTE — Progress Notes (Signed)
 Cardiology Office Note:  .   Date:  04/05/2024  ID:  BRYCIN KILLE, DOB 01-Feb-1946, MRN [id number] PCP: Geofm Glade PARAS, MD  [facility] Providers Cardiologist:  Wilbert Bihari, MD Electrophysiologist:  Soyla Gladis Norton, MD {  History of Present Illness: .   Jeff Weaver is a 78 y.o. 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 He was remotely seen by Dr. Bihari and has largely been followed by Raphael Bring, PA recently. He established care with me on 04/05/24.  Today: Here with Jamie (wife) and Nathanel (daughter). In a wheelchair, minimally ambulatory except for going from den to the bathroom. This has been progressive since the end of the summer.   His only symptom from cardiac standpoint is shortness of breath. Has pending repeat echo in 10 days. He thinks his shortness of breath is getting worse. Notes this is both at rest and when he moves. Has chronic LE edema. No syncope. No palpitations.   Reports chronic sharp pain that starts in abdomen and moves up his chest. Lasts 20 minutes, no clear triggers.   He is following with [facility] heme/onc for his acute myeloid leukemia. He is receiving azacitidine. Just finished first seven day round of chemo, appeared to tolerate well. Stopped allopurinol due to rash. Has received multiple transfusions of RBC and platelets. Recommended to take full pill on days he receives treatment.   Has intermittent coughing spells, often related to times he is eating but other times random. Dry/nonproductive.  Reviewed daily weights, what to watch for in terms of heart failure/fluid retention.   Reviewed blood pressure/symptoms. Feels dizzy/lightheaded with position changes. Knows to stand for a time once he stands before he starts to move. Takes midodrine  in the morning. No recent falls.   ROS: ROS  otherwise negative except as noted.   Studies Reviewed: SABRA    EKG:       Physical Exam:   VS:  BP 119/71   Pulse 80   Ht 5' 8.5 (1.74 m)   Wt 235 lb 9.6 oz (106.9 kg) Comment: weight from this morning at another appt  SpO2 91%   BMI 35.30 kg/m    Wt Readings from Last 3 Encounters:  04/05/24 235 lb 9.6 oz (106.9 kg)  03/25/24 240 lb 14.4 oz (109.3 kg)  03/20/24 237 lb (107.5 kg)    GEN: Well nourished, well developed in no acute distress HEENT: Normal, moist mucous membranes NECK: No JVD CARDIAC: regular rhythm, normal S1 and S2, no rubs or gallops. No murmur. VASCULAR: Radial and DP pulses 2+ bilaterally. No carotid bruits RESPIRATORY:  Clear to auscultation without rales, wheezing or rhonchi  ABDOMEN: Soft, non-tender, non-distended MUSCULOSKELETAL:  in wheelchair, moves all 4 limbs independently SKIN: Warm and dry, bilateral 1+ LE edema NEUROLOGIC:  No focal neuro deficits noted. PSYCHIATRIC:  Normal affect    ASSESSMENT AND PLAN: .    Shortness of breath History of heart failure with improved ejection fraction Valvular disease -lungs clear on exam, does have prior history of moderate AI and mild MR. Do not appreciate significant murmurs today. Echo pending -also has prior history of reduced ejection fraction. Last EF 55% on 10/2023, G1DD, RAP 3. Lowest EF was 35% in 11/2020 prior to CRT upgrade -he is also very deconditioned, minimally active, also likely contributing  Chronic LE edema -awaiting echo, but given that  this has been long term despite prior echo with normal right atrial pressure, suspect this is largely venous insufficiency -we reviewed compression stockings, elevating legs as much as possible -he is currently alternating 10 mg torsemide  with 20 mg torsemide  each day  Hypotension, orthostasis -tolerating midodrine , knows to make slow position changes, no falls  Acute myeloid leukemia, on chemotherapy -being treated through [facility], tolerating first round  thus far  CAD, nonobstructive by cath -CT coronary in 2019 showed significant stenosis in OM1, but follow up cath showed minimal nonobstructive CAD -on rosuvastatin  -has had atypical abdominal/chest pain symptoms for several years, with stress testing unremarkable  Lewy body dementia: this does make it somewhat difficult to fully determine the extent of his symptoms. Family very helpful in providing additional history and information.   Dispo: 3 mos or sooner as needed  Total time of encounter: I spent 52 minutes dedicated to the care of this patient on the date of this encounter to include pre-visit review of records, face-to-face time with the patient discussing conditions above, and clinical documentation with the electronic health record. We specifically spent time today discussing multiple complex conditions above, how they all interact, signs/symptoms to watch for.  Signed, Shelda Bruckner, MD   Shelda Bruckner, MD, PhD, [facility]  [facility]  [place]  [facility] at [facility] [address], [address] [place], [place] [address] [phone]

## 2024-04-05 NOTE — Patient Instructions (Signed)
 Medication Instructions:  No changes *If you need a refill on your cardiac medications before your next appointment, please call your pharmacy*  Lab Work: none   Testing/Procedures: Echo as planned  Follow-Up: At [facility], you and your health needs are our priority.  As part of our continuing mission to provide you with exceptional heart care, our providers are all part of one team.  This team includes your primary Cardiologist (physician) and Advanced Practice Providers or APPs (Physician Assistants and Nurse Practitioners) who all work together to provide you with the care you need, when you need it.  Your next appointment:   3 month(s)  Provider:   Shelda Bruckner, MD

## 2024-04-08 ENCOUNTER — Encounter

## 2024-04-08 ENCOUNTER — Other Ambulatory Visit

## 2024-04-11 ENCOUNTER — Encounter

## 2024-04-11 ENCOUNTER — Other Ambulatory Visit

## 2024-04-12 ENCOUNTER — Encounter (HOSPITAL_BASED_OUTPATIENT_CLINIC_OR_DEPARTMENT_OTHER): Payer: Self-pay | Admitting: Cardiology

## 2024-04-15 ENCOUNTER — Ambulatory Visit (HOSPITAL_BASED_OUTPATIENT_CLINIC_OR_DEPARTMENT_OTHER)
Admission: RE | Admit: 2024-04-15 | Discharge: 2024-04-15 | Disposition: A | Discharge status: Home / Self Care | Source: Ambulatory Visit | Attending: Physician Assistant | Admitting: Physician Assistant

## 2024-04-15 DIAGNOSIS — R0600 Dyspnea, unspecified: Secondary | ICD-10-CM | POA: Insufficient documentation

## 2024-04-15 LAB — ECHOCARDIOGRAM COMPLETE
AR max vel: 3.11 cm2
AV Area VTI: 3.17 cm2
AV Area mean vel: 2.95 cm2
AV Mean grad: 5 mmHg
AV Peak grad: 9.4 mmHg
Ao pk vel: 1.53 m/s
Area-P 1/2: 3.39 cm2
Calc EF: 70.4 %
MV M vel: 5.53 m/s
MV Peak grad: 122.3 mmHg
S' Lateral: 3.05 cm
Single Plane A2C EF: 72.7 %
Single Plane A4C EF: 65.7 %

## 2024-04-16 ENCOUNTER — Encounter

## 2024-04-16 ENCOUNTER — Other Ambulatory Visit

## 2024-04-16 ENCOUNTER — Ambulatory Visit: Payer: Self-pay | Admitting: Physician Assistant

## 2024-04-19 ENCOUNTER — Other Ambulatory Visit

## 2024-04-19 ENCOUNTER — Encounter

## 2024-04-22 ENCOUNTER — Other Ambulatory Visit

## 2024-04-22 ENCOUNTER — Encounter

## 2024-04-25 ENCOUNTER — Encounter

## 2024-04-25 ENCOUNTER — Other Ambulatory Visit

## 2024-04-25 ENCOUNTER — Ambulatory Visit

## 2024-05-03 ENCOUNTER — Other Ambulatory Visit: Payer: Self-pay | Admitting: Internal Medicine

## 2024-05-03 MED ORDER — MIRABEGRON ER 50 MG PO TB24: 50.0000 mg | ORAL_TABLET | Freq: Every day | ORAL | 1 refills | Status: AC

## 2024-05-24 ENCOUNTER — Ambulatory Visit: Payer: Medicare Other

## 2024-05-24 DIAGNOSIS — I5020 Unspecified systolic (congestive) heart failure: Secondary | ICD-10-CM

## 2024-05-24 LAB — CUP PACEART REMOTE DEVICE CHECK
Battery Remaining Longevity: 88 mo
Battery Voltage: 2.98 V
Brady Statistic AP VP Percent: 41.76 %
Brady Statistic AP VS Percent: 0.01 %
Brady Statistic AS VP Percent: 58.18 %
Brady Statistic AS VS Percent: 0.05 %
Brady Statistic RA Percent Paced: 41.73 %
Brady Statistic RV Percent Paced: 99.93 %
Date Time Interrogation Session: 20251121013309
Implantable Lead Connection Status: 753985
Implantable Lead Connection Status: 753985
Implantable Lead Connection Status: 753985
Implantable Lead Implant Date: 20191230
Implantable Lead Implant Date: 20191230
Implantable Lead Implant Date: 20220907
Implantable Lead Location: 753858
Implantable Lead Location: 753859
Implantable Lead Location: 753860
Implantable Lead Model: 4598
Implantable Lead Model: 5076
Implantable Lead Model: 5076
Implantable Pulse Generator Implant Date: 20220907
Lead Channel Impedance Value: 285 Ohm
Lead Channel Impedance Value: 285 Ohm
Lead Channel Impedance Value: 361 Ohm
Lead Channel Impedance Value: 361 Ohm
Lead Channel Impedance Value: 380 Ohm
Lead Channel Impedance Value: 399 Ohm
Lead Channel Impedance Value: 418 Ohm
Lead Channel Impedance Value: 456 Ohm
Lead Channel Impedance Value: 532 Ohm
Lead Channel Impedance Value: 608 Ohm
Lead Channel Impedance Value: 627 Ohm
Lead Channel Impedance Value: 665 Ohm
Lead Channel Impedance Value: 665 Ohm
Lead Channel Impedance Value: 741 Ohm
Lead Channel Pacing Threshold Amplitude: 0.625 V
Lead Channel Pacing Threshold Amplitude: 0.875 V
Lead Channel Pacing Threshold Amplitude: 1.125 V
Lead Channel Pacing Threshold Pulse Width: 0.4 ms
Lead Channel Pacing Threshold Pulse Width: 0.4 ms
Lead Channel Pacing Threshold Pulse Width: 0.4 ms
Lead Channel Sensing Intrinsic Amplitude: 1.125 mV
Lead Channel Sensing Intrinsic Amplitude: 1.125 mV
Lead Channel Sensing Intrinsic Amplitude: 15.75 mV
Lead Channel Sensing Intrinsic Amplitude: 15.75 mV
Lead Channel Setting Pacing Amplitude: 1.5 V
Lead Channel Setting Pacing Amplitude: 1.5 V
Lead Channel Setting Pacing Amplitude: 2.25 V
Lead Channel Setting Pacing Pulse Width: 0.4 ms
Lead Channel Setting Pacing Pulse Width: 0.4 ms
Lead Channel Setting Sensing Sensitivity: 1.2 mV
Zone Setting Status: 755011
Zone Setting Status: 755011

## 2024-05-26 ENCOUNTER — Ambulatory Visit: Payer: Self-pay | Admitting: Cardiology

## 2024-05-27 NOTE — Progress Notes (Signed)
 Remote PPM Transmission

## 2024-06-04 NOTE — Telephone Encounter (Signed)
 Left a message to call the office back or view on MyChart.

## 2024-06-05 ENCOUNTER — Telehealth (HOSPITAL_BASED_OUTPATIENT_CLINIC_OR_DEPARTMENT_OTHER): Payer: Self-pay | Admitting: Cardiology

## 2024-06-05 NOTE — Telephone Encounter (Signed)
 Returned call to Mrs. Fulller. Reviewed the echo results from Dayna as well as the follow up messages from her.   She will check mychart and try to view them and is aware results will be discussed further with Dr. Lonni at next visit.

## 2024-06-05 NOTE — Telephone Encounter (Signed)
 Wife Karle) stated she is returning staff call regarding results from D. Dunn, PA.

## 2024-07-08 ENCOUNTER — Ambulatory Visit (HOSPITAL_BASED_OUTPATIENT_CLINIC_OR_DEPARTMENT_OTHER): Admitting: Cardiology

## 2024-08-06 LAB — LAB REPORT - SCANNED

## 2024-08-08 ENCOUNTER — Ambulatory Visit: Payer: Medicare Other

## 2024-08-09 ENCOUNTER — Ambulatory Visit

## 2024-08-23 ENCOUNTER — Ambulatory Visit: Payer: Medicare Other

## 2024-10-21 ENCOUNTER — Ambulatory Visit: Admitting: Physician Assistant

## 2024-11-22 ENCOUNTER — Ambulatory Visit

## 2025-02-21 ENCOUNTER — Ambulatory Visit

## 2025-05-23 ENCOUNTER — Ambulatory Visit

## 2025-08-22 ENCOUNTER — Ambulatory Visit

## 2025-11-21 ENCOUNTER — Ambulatory Visit

## 2026-02-20 ENCOUNTER — Ambulatory Visit

## 2026-05-22 ENCOUNTER — Ambulatory Visit

## 2026-08-21 ENCOUNTER — Ambulatory Visit
